# Patient Record
Sex: Female | Born: 1955
Health system: Southern US, Community
[De-identification: ages and names within clinical notes are randomized; demographics above are authoritative.]

## PROBLEM LIST (undated history)

## (undated) DIAGNOSIS — Z87440 Personal history of urinary (tract) infections: Secondary | ICD-10-CM

## (undated) DIAGNOSIS — K219 Gastro-esophageal reflux disease without esophagitis: Secondary | ICD-10-CM

## (undated) DIAGNOSIS — J984 Other disorders of lung: Secondary | ICD-10-CM

## (undated) DIAGNOSIS — F419 Anxiety disorder, unspecified: Secondary | ICD-10-CM

## (undated) DIAGNOSIS — F329 Major depressive disorder, single episode, unspecified: Secondary | ICD-10-CM

## (undated) DIAGNOSIS — F32A Depression, unspecified: Secondary | ICD-10-CM

## (undated) DIAGNOSIS — S82892A Other fracture of left lower leg, initial encounter for closed fracture: Secondary | ICD-10-CM

## (undated) DIAGNOSIS — N200 Calculus of kidney: Secondary | ICD-10-CM

## (undated) DIAGNOSIS — I10 Essential (primary) hypertension: Secondary | ICD-10-CM

## (undated) DIAGNOSIS — F319 Bipolar disorder, unspecified: Secondary | ICD-10-CM

## (undated) DIAGNOSIS — F4001 Agoraphobia with panic disorder: Secondary | ICD-10-CM

## (undated) DIAGNOSIS — K7460 Unspecified cirrhosis of liver: Secondary | ICD-10-CM

## (undated) DIAGNOSIS — R4182 Altered mental status, unspecified: Secondary | ICD-10-CM

## (undated) DIAGNOSIS — N183 Chronic kidney disease, stage 3 unspecified: Secondary | ICD-10-CM

## (undated) DIAGNOSIS — I82409 Acute embolism and thrombosis of unspecified deep veins of unspecified lower extremity: Secondary | ICD-10-CM

## (undated) DIAGNOSIS — I2699 Other pulmonary embolism without acute cor pulmonale: Secondary | ICD-10-CM

## (undated) DIAGNOSIS — R350 Frequency of micturition: Secondary | ICD-10-CM

## (undated) DIAGNOSIS — J189 Pneumonia, unspecified organism: Secondary | ICD-10-CM

## (undated) DIAGNOSIS — M1990 Unspecified osteoarthritis, unspecified site: Secondary | ICD-10-CM

## (undated) DIAGNOSIS — R011 Cardiac murmur, unspecified: Secondary | ICD-10-CM

## (undated) DIAGNOSIS — B1910 Unspecified viral hepatitis B without hepatic coma: Secondary | ICD-10-CM

## (undated) DIAGNOSIS — R32 Unspecified urinary incontinence: Secondary | ICD-10-CM

## (undated) DIAGNOSIS — J449 Chronic obstructive pulmonary disease, unspecified: Secondary | ICD-10-CM

## (undated) DIAGNOSIS — IMO0001 Reserved for inherently not codable concepts without codable children: Secondary | ICD-10-CM

## (undated) DIAGNOSIS — Z9289 Personal history of other medical treatment: Secondary | ICD-10-CM

## (undated) HISTORY — PX: HAND SURGERY: SHX662

## (undated) HISTORY — PX: CARPAL TUNNEL RELEASE: SHX101

## (undated) HISTORY — PX: TUBAL LIGATION: SHX77

## (undated) HISTORY — PX: ANKLE FRACTURE SURGERY: SHX122

## (undated) HISTORY — PX: DILATION AND CURETTAGE OF UTERUS: SHX78

## (undated) HISTORY — PX: FRACTURE SURGERY: SHX138

## (undated) HISTORY — PX: ABDOMINAL HYSTERECTOMY: SHX81

---

## 2004-09-29 ENCOUNTER — Ambulatory Visit: Payer: Self-pay | Admitting: Internal Medicine

## 2004-10-28 ENCOUNTER — Ambulatory Visit: Payer: Self-pay | Admitting: Gastroenterology

## 2004-12-01 ENCOUNTER — Ambulatory Visit: Payer: Self-pay | Admitting: Gastroenterology

## 2005-06-22 ENCOUNTER — Ambulatory Visit: Payer: Self-pay | Admitting: Gastroenterology

## 2005-09-23 ENCOUNTER — Emergency Department (HOSPITAL_COMMUNITY): Admission: EM | Admit: 2005-09-23 | Discharge: 2005-09-23 | Payer: Self-pay | Admitting: Emergency Medicine

## 2007-02-11 ENCOUNTER — Ambulatory Visit (HOSPITAL_COMMUNITY): Payer: Self-pay | Admitting: Psychiatry

## 2007-02-27 ENCOUNTER — Ambulatory Visit (HOSPITAL_COMMUNITY): Payer: Self-pay | Admitting: Psychiatry

## 2007-04-10 ENCOUNTER — Ambulatory Visit (HOSPITAL_COMMUNITY): Payer: Self-pay | Admitting: Psychiatry

## 2007-05-27 ENCOUNTER — Ambulatory Visit (HOSPITAL_COMMUNITY): Payer: Self-pay | Admitting: Psychiatry

## 2007-09-16 ENCOUNTER — Ambulatory Visit (HOSPITAL_COMMUNITY): Payer: Self-pay | Admitting: Psychiatry

## 2007-11-11 ENCOUNTER — Ambulatory Visit (HOSPITAL_COMMUNITY): Payer: Self-pay | Admitting: Psychiatry

## 2008-02-17 ENCOUNTER — Ambulatory Visit (HOSPITAL_COMMUNITY): Payer: Self-pay | Admitting: Psychiatry

## 2008-06-16 ENCOUNTER — Encounter: Payer: Self-pay | Admitting: Internal Medicine

## 2008-08-31 DIAGNOSIS — D509 Iron deficiency anemia, unspecified: Secondary | ICD-10-CM

## 2008-08-31 DIAGNOSIS — D500 Iron deficiency anemia secondary to blood loss (chronic): Secondary | ICD-10-CM | POA: Insufficient documentation

## 2008-08-31 DIAGNOSIS — I10 Essential (primary) hypertension: Secondary | ICD-10-CM

## 2008-08-31 DIAGNOSIS — F411 Generalized anxiety disorder: Secondary | ICD-10-CM | POA: Insufficient documentation

## 2008-08-31 DIAGNOSIS — Z8619 Personal history of other infectious and parasitic diseases: Secondary | ICD-10-CM | POA: Insufficient documentation

## 2008-08-31 DIAGNOSIS — B1910 Unspecified viral hepatitis B without hepatic coma: Secondary | ICD-10-CM

## 2008-08-31 DIAGNOSIS — B1710 Acute hepatitis C without hepatic coma: Secondary | ICD-10-CM

## 2008-08-31 DIAGNOSIS — B181 Chronic viral hepatitis B without delta-agent: Secondary | ICD-10-CM | POA: Insufficient documentation

## 2008-08-31 DIAGNOSIS — K7460 Unspecified cirrhosis of liver: Secondary | ICD-10-CM | POA: Insufficient documentation

## 2008-08-31 DIAGNOSIS — I2510 Atherosclerotic heart disease of native coronary artery without angina pectoris: Secondary | ICD-10-CM | POA: Insufficient documentation

## 2008-09-01 ENCOUNTER — Ambulatory Visit: Payer: Self-pay | Admitting: Internal Medicine

## 2008-09-01 DIAGNOSIS — J449 Chronic obstructive pulmonary disease, unspecified: Secondary | ICD-10-CM

## 2008-09-01 DIAGNOSIS — E669 Obesity, unspecified: Secondary | ICD-10-CM

## 2009-02-04 ENCOUNTER — Emergency Department (HOSPITAL_COMMUNITY): Admission: EM | Admit: 2009-02-04 | Discharge: 2009-02-04 | Payer: Self-pay | Admitting: Emergency Medicine

## 2009-02-23 ENCOUNTER — Ambulatory Visit (HOSPITAL_BASED_OUTPATIENT_CLINIC_OR_DEPARTMENT_OTHER): Admission: RE | Admit: 2009-02-23 | Discharge: 2009-02-23 | Payer: Self-pay | Admitting: Urology

## 2009-09-23 DIAGNOSIS — F32A Depression, unspecified: Secondary | ICD-10-CM | POA: Insufficient documentation

## 2009-09-23 DIAGNOSIS — F329 Major depressive disorder, single episode, unspecified: Secondary | ICD-10-CM | POA: Insufficient documentation

## 2009-09-23 DIAGNOSIS — R188 Other ascites: Secondary | ICD-10-CM | POA: Insufficient documentation

## 2009-12-06 ENCOUNTER — Emergency Department (HOSPITAL_COMMUNITY): Admission: EM | Admit: 2009-12-06 | Discharge: 2009-12-06 | Payer: Self-pay | Admitting: Emergency Medicine

## 2009-12-08 ENCOUNTER — Emergency Department (HOSPITAL_COMMUNITY): Admission: EM | Admit: 2009-12-08 | Discharge: 2009-12-08 | Payer: Self-pay | Admitting: Emergency Medicine

## 2009-12-10 ENCOUNTER — Emergency Department (HOSPITAL_COMMUNITY): Admission: EM | Admit: 2009-12-10 | Discharge: 2009-12-10 | Payer: Self-pay | Admitting: Emergency Medicine

## 2009-12-14 ENCOUNTER — Emergency Department (HOSPITAL_COMMUNITY): Admission: EM | Admit: 2009-12-14 | Discharge: 2009-12-14 | Payer: Self-pay | Admitting: Family Medicine

## 2010-03-11 ENCOUNTER — Other Ambulatory Visit (HOSPITAL_COMMUNITY): Payer: Self-pay | Admitting: Oral Surgery

## 2010-03-11 ENCOUNTER — Encounter (HOSPITAL_COMMUNITY)
Admission: RE | Admit: 2010-03-11 | Discharge: 2010-03-11 | Disposition: A | Discharge status: Home / Self Care | Payer: Medicare Other | Source: Ambulatory Visit | Attending: Oral Surgery | Admitting: Oral Surgery

## 2010-03-11 ENCOUNTER — Ambulatory Visit (HOSPITAL_COMMUNITY)
Admission: RE | Admit: 2010-03-11 | Discharge: 2010-03-11 | Disposition: A | Discharge status: Home / Self Care | Payer: Medicare Other | Source: Ambulatory Visit | Attending: Oral Surgery | Admitting: Oral Surgery

## 2010-03-11 DIAGNOSIS — K0889 Other specified disorders of teeth and supporting structures: Secondary | ICD-10-CM

## 2010-03-11 DIAGNOSIS — Z01818 Encounter for other preprocedural examination: Secondary | ICD-10-CM | POA: Insufficient documentation

## 2010-03-11 LAB — COMPREHENSIVE METABOLIC PANEL
Alkaline Phosphatase: 77 U/L (ref 39–117)
BUN: 32 mg/dL — ABNORMAL HIGH (ref 6–23)
Calcium: 9.1 mg/dL (ref 8.4–10.5)
Creatinine, Ser: 1.3 mg/dL — ABNORMAL HIGH (ref 0.4–1.2)
GFR calc Af Amer: 52 mL/min — ABNORMAL LOW (ref >60)
Glucose, Bld: 85 mg/dL (ref 70–99)
Sodium: 140 mEq/L (ref 135–145)
Total Protein: 6.6 g/dL (ref 6.0–8.3)

## 2010-03-11 LAB — CBC
HCT: 38.8 % (ref 36.0–46.0)
MCHC: 33.2 g/dL (ref 30.0–36.0)
RBC: 4.14 MIL/uL (ref 3.87–5.11)
WBC: 6.2 10*3/uL (ref 4.0–10.5)

## 2010-03-11 LAB — BILIRUBIN, DIRECT: Bilirubin, Direct: <0.1 mg/dL (ref 0.0–0.3)

## 2010-03-15 ENCOUNTER — Ambulatory Visit (HOSPITAL_COMMUNITY): Admission: RE | Admit: 2010-03-15 | Payer: Medicare Other | Source: Ambulatory Visit | Admitting: Oral Surgery

## 2010-04-03 ENCOUNTER — Emergency Department (HOSPITAL_COMMUNITY)
Admission: EM | Admit: 2010-04-03 | Discharge: 2010-04-03 | Disposition: A | Discharge status: Home / Self Care | Payer: Medicare Other | Attending: Emergency Medicine | Admitting: Emergency Medicine

## 2010-04-03 ENCOUNTER — Emergency Department (HOSPITAL_COMMUNITY): Payer: Medicare Other

## 2010-04-03 DIAGNOSIS — J45909 Unspecified asthma, uncomplicated: Secondary | ICD-10-CM | POA: Insufficient documentation

## 2010-04-03 DIAGNOSIS — R1013 Epigastric pain: Secondary | ICD-10-CM | POA: Insufficient documentation

## 2010-04-03 DIAGNOSIS — Z79899 Other long term (current) drug therapy: Secondary | ICD-10-CM | POA: Insufficient documentation

## 2010-04-03 DIAGNOSIS — Z8619 Personal history of other infectious and parasitic diseases: Secondary | ICD-10-CM | POA: Insufficient documentation

## 2010-04-03 DIAGNOSIS — K7460 Unspecified cirrhosis of liver: Secondary | ICD-10-CM | POA: Insufficient documentation

## 2010-04-03 DIAGNOSIS — I10 Essential (primary) hypertension: Secondary | ICD-10-CM | POA: Insufficient documentation

## 2010-04-03 DIAGNOSIS — K219 Gastro-esophageal reflux disease without esophagitis: Secondary | ICD-10-CM | POA: Insufficient documentation

## 2010-04-03 DIAGNOSIS — F411 Generalized anxiety disorder: Secondary | ICD-10-CM | POA: Insufficient documentation

## 2010-04-03 DIAGNOSIS — R05 Cough: Secondary | ICD-10-CM | POA: Insufficient documentation

## 2010-04-03 DIAGNOSIS — R059 Cough, unspecified: Secondary | ICD-10-CM | POA: Insufficient documentation

## 2010-04-03 DIAGNOSIS — R10816 Epigastric abdominal tenderness: Secondary | ICD-10-CM | POA: Insufficient documentation

## 2010-04-03 LAB — COMPREHENSIVE METABOLIC PANEL
CO2: 27 mEq/L (ref 19–32)
Calcium: 9.1 mg/dL (ref 8.4–10.5)
Creatinine, Ser: 1.21 mg/dL — ABNORMAL HIGH (ref 0.4–1.2)
GFR calc Af Amer: 56 mL/min — ABNORMAL LOW (ref >60)
GFR calc non Af Amer: 47 mL/min — ABNORMAL LOW (ref >60)
Glucose, Bld: 120 mg/dL — ABNORMAL HIGH (ref 70–99)
Potassium: 4.4 mEq/L (ref 3.5–5.1)
Total Bilirubin: 0.3 mg/dL (ref 0.3–1.2)

## 2010-04-03 LAB — DIFFERENTIAL
Basophils Absolute: 0 10*3/uL (ref 0.0–0.1)
Basophils Relative: 0 % (ref 0–1)
Eosinophils Absolute: 0 10*3/uL (ref 0.0–0.7)
Lymphocytes Relative: 11 % — ABNORMAL LOW (ref 12–46)
Monocytes Absolute: 0.4 10*3/uL (ref 0.1–1.0)
Neutro Abs: 7.3 10*3/uL (ref 1.7–7.7)

## 2010-04-03 LAB — CBC
MCV: 94.7 fL (ref 78.0–100.0)
Platelets: 136 10*3/uL — ABNORMAL LOW (ref 150–400)
RBC: 3.93 MIL/uL (ref 3.87–5.11)
RDW: 13.6 % (ref 11.5–15.5)
WBC: 8.7 10*3/uL (ref 4.0–10.5)

## 2010-04-13 LAB — TYPE AND SCREEN
ABO/RH(D): B NEG
Antibody Screen: NEGATIVE

## 2010-04-13 LAB — COMPREHENSIVE METABOLIC PANEL
AST: 19 U/L (ref 0–37)
Albumin: 3.8 g/dL (ref 3.5–5.2)
Alkaline Phosphatase: 91 U/L (ref 39–117)
CO2: 24 mEq/L (ref 19–32)
Chloride: 105 mEq/L (ref 96–112)
Glucose, Bld: 97 mg/dL (ref 70–99)
Potassium: 4 mEq/L (ref 3.5–5.1)
Sodium: 137 mEq/L (ref 135–145)
Total Bilirubin: 0.7 mg/dL (ref 0.3–1.2)
Total Protein: 7.4 g/dL (ref 6.0–8.3)

## 2010-04-13 LAB — CBC
HCT: 46.9 % — ABNORMAL HIGH (ref 36.0–46.0)
Hemoglobin: 15.8 g/dL — ABNORMAL HIGH (ref 12.0–15.0)
MCHC: 33.6 g/dL (ref 30.0–36.0)
RBC: 4.9 MIL/uL (ref 3.87–5.11)
RDW: 13.1 % (ref 11.5–15.5)

## 2010-04-13 LAB — PROTIME-INR: INR: 1.04 (ref 0.00–1.49)

## 2010-04-13 LAB — ABO/RH: ABO/RH(D): B NEG

## 2010-05-16 ENCOUNTER — Ambulatory Visit (INDEPENDENT_AMBULATORY_CARE_PROVIDER_SITE_OTHER): Payer: Medicare Other | Admitting: Gastroenterology

## 2010-05-16 DIAGNOSIS — K7460 Unspecified cirrhosis of liver: Secondary | ICD-10-CM

## 2010-05-16 DIAGNOSIS — B181 Chronic viral hepatitis B without delta-agent: Secondary | ICD-10-CM

## 2010-05-16 DIAGNOSIS — R945 Abnormal results of liver function studies: Secondary | ICD-10-CM

## 2010-05-30 ENCOUNTER — Inpatient Hospital Stay (HOSPITAL_COMMUNITY)
Admission: EM | Admit: 2010-05-30 | Discharge: 2010-06-03 | DRG: 193 | Disposition: A | Discharge status: Home / Self Care | Payer: Medicare Other | Attending: Internal Medicine | Admitting: Internal Medicine

## 2010-05-30 ENCOUNTER — Emergency Department (HOSPITAL_COMMUNITY): Payer: Medicare Other

## 2010-05-30 DIAGNOSIS — J189 Pneumonia, unspecified organism: Principal | ICD-10-CM | POA: Diagnosis present

## 2010-05-30 DIAGNOSIS — D6959 Other secondary thrombocytopenia: Secondary | ICD-10-CM | POA: Diagnosis present

## 2010-05-30 DIAGNOSIS — K219 Gastro-esophageal reflux disease without esophagitis: Secondary | ICD-10-CM | POA: Diagnosis present

## 2010-05-30 DIAGNOSIS — J45909 Unspecified asthma, uncomplicated: Secondary | ICD-10-CM | POA: Diagnosis present

## 2010-05-30 DIAGNOSIS — I129 Hypertensive chronic kidney disease with stage 1 through stage 4 chronic kidney disease, or unspecified chronic kidney disease: Secondary | ICD-10-CM | POA: Diagnosis present

## 2010-05-30 DIAGNOSIS — F3289 Other specified depressive episodes: Secondary | ICD-10-CM | POA: Diagnosis present

## 2010-05-30 DIAGNOSIS — K5900 Constipation, unspecified: Secondary | ICD-10-CM | POA: Diagnosis present

## 2010-05-30 DIAGNOSIS — E876 Hypokalemia: Secondary | ICD-10-CM | POA: Diagnosis not present

## 2010-05-30 DIAGNOSIS — F17200 Nicotine dependence, unspecified, uncomplicated: Secondary | ICD-10-CM | POA: Diagnosis present

## 2010-05-30 DIAGNOSIS — K7460 Unspecified cirrhosis of liver: Secondary | ICD-10-CM | POA: Diagnosis present

## 2010-05-30 DIAGNOSIS — E871 Hypo-osmolality and hyponatremia: Secondary | ICD-10-CM | POA: Diagnosis present

## 2010-05-30 DIAGNOSIS — F329 Major depressive disorder, single episode, unspecified: Secondary | ICD-10-CM | POA: Diagnosis present

## 2010-05-30 DIAGNOSIS — J9600 Acute respiratory failure, unspecified whether with hypoxia or hypercapnia: Secondary | ICD-10-CM | POA: Diagnosis present

## 2010-05-30 DIAGNOSIS — N189 Chronic kidney disease, unspecified: Secondary | ICD-10-CM | POA: Diagnosis present

## 2010-05-30 DIAGNOSIS — Z7982 Long term (current) use of aspirin: Secondary | ICD-10-CM

## 2010-05-30 DIAGNOSIS — N179 Acute kidney failure, unspecified: Secondary | ICD-10-CM | POA: Diagnosis present

## 2010-05-30 DIAGNOSIS — B1910 Unspecified viral hepatitis B without hepatic coma: Secondary | ICD-10-CM | POA: Diagnosis present

## 2010-05-30 LAB — POCT CARDIAC MARKERS
CKMB, poc: <1 ng/mL — ABNORMAL LOW (ref 1.0–8.0)
Myoglobin, poc: 85.5 ng/mL (ref 12–200)
Troponin i, poc: <0.05 ng/mL (ref 0.00–0.09)

## 2010-05-30 LAB — CBC
HCT: 37.4 % (ref 36.0–46.0)
Hemoglobin: 12.6 g/dL (ref 12.0–15.0)
MCHC: 33.7 g/dL (ref 30.0–36.0)
MCV: 91.7 fL (ref 78.0–100.0)
RDW: 13.2 % (ref 11.5–15.5)

## 2010-05-30 LAB — BASIC METABOLIC PANEL
BUN: 18 mg/dL (ref 6–23)
Calcium: 9.2 mg/dL (ref 8.4–10.5)
Creatinine, Ser: 1.49 mg/dL — ABNORMAL HIGH (ref 0.4–1.2)
GFR calc non Af Amer: 37 mL/min — ABNORMAL LOW (ref >60)
Glucose, Bld: 112 mg/dL — ABNORMAL HIGH (ref 70–99)

## 2010-05-30 LAB — DIFFERENTIAL
Basophils Absolute: 0 10*3/uL (ref 0.0–0.1)
Basophils Relative: 0 % (ref 0–1)
Eosinophils Relative: 0 % (ref 0–5)
Monocytes Absolute: 0.7 10*3/uL (ref 0.1–1.0)
Monocytes Relative: 9 % (ref 3–12)

## 2010-05-30 LAB — LIPID PANEL
HDL: 68 mg/dL (ref >39)
Total CHOL/HDL Ratio: 2.1 RATIO
Triglycerides: 71 mg/dL (ref <150)

## 2010-05-30 LAB — CK TOTAL AND CKMB (NOT AT ARMC): Relative Index: INVALID (ref 0.0–2.5)

## 2010-05-31 ENCOUNTER — Inpatient Hospital Stay (HOSPITAL_COMMUNITY): Payer: Medicare Other

## 2010-05-31 LAB — COMPREHENSIVE METABOLIC PANEL
ALT: 16 U/L (ref 0–35)
Alkaline Phosphatase: 76 U/L (ref 39–117)
BUN: 20 mg/dL (ref 6–23)
CO2: 26 mEq/L (ref 19–32)
GFR calc non Af Amer: 37 mL/min — ABNORMAL LOW (ref >60)
Glucose, Bld: 173 mg/dL — ABNORMAL HIGH (ref 70–99)
Potassium: 3.3 mEq/L — ABNORMAL LOW (ref 3.5–5.1)
Sodium: 137 mEq/L (ref 135–145)
Total Protein: 6.6 g/dL (ref 6.0–8.3)

## 2010-05-31 LAB — CK TOTAL AND CKMB (NOT AT ARMC)
CK, MB: 2.3 ng/mL (ref 0.3–4.0)
Relative Index: INVALID (ref 0.0–2.5)
Total CK: 71 U/L (ref 7–177)
Total CK: 84 U/L (ref 7–177)

## 2010-05-31 LAB — CBC
MCHC: 33.3 g/dL (ref 30.0–36.0)
Platelets: 95 10*3/uL — ABNORMAL LOW (ref 150–400)
RDW: 13.5 % (ref 11.5–15.5)
WBC: 5.9 10*3/uL (ref 4.0–10.5)

## 2010-05-31 LAB — TSH: TSH: 0.596 u[IU]/mL (ref 0.350–4.500)

## 2010-06-01 ENCOUNTER — Inpatient Hospital Stay (HOSPITAL_COMMUNITY): Payer: Medicare Other

## 2010-06-01 LAB — CBC
HCT: 34.3 % — ABNORMAL LOW (ref 36.0–46.0)
MCH: 30.8 pg (ref 26.0–34.0)
MCHC: 32.9 g/dL (ref 30.0–36.0)
MCV: 93.5 fL (ref 78.0–100.0)
Platelets: 98 10*3/uL — ABNORMAL LOW (ref 150–400)
RDW: 13.4 % (ref 11.5–15.5)
WBC: 4.9 10*3/uL (ref 4.0–10.5)

## 2010-06-01 LAB — BASIC METABOLIC PANEL
BUN: 18 mg/dL (ref 6–23)
Calcium: 9.2 mg/dL (ref 8.4–10.5)
Creatinine, Ser: 1.45 mg/dL — ABNORMAL HIGH (ref 0.4–1.2)
GFR calc non Af Amer: 38 mL/min — ABNORMAL LOW (ref >60)
Glucose, Bld: 158 mg/dL — ABNORMAL HIGH (ref 70–99)

## 2010-06-01 LAB — MAGNESIUM: Magnesium: 2 mg/dL (ref 1.5–2.5)

## 2010-06-02 LAB — DIFFERENTIAL
Basophils Relative: 0 % (ref 0–1)
Eosinophils Absolute: 0.1 10*3/uL (ref 0.0–0.7)
Eosinophils Relative: 2 % (ref 0–5)
Lymphs Abs: 1 10*3/uL (ref 0.7–4.0)
Neutrophils Relative %: 70 % (ref 43–77)

## 2010-06-02 LAB — CBC
MCV: 93.5 fL (ref 78.0–100.0)
Platelets: 129 10*3/uL — ABNORMAL LOW (ref 150–400)
RDW: 13.4 % (ref 11.5–15.5)
WBC: 5.1 10*3/uL (ref 4.0–10.5)

## 2010-06-02 LAB — BASIC METABOLIC PANEL
BUN: 15 mg/dL (ref 6–23)
Creatinine, Ser: 1.14 mg/dL (ref 0.4–1.2)
GFR calc Af Amer: >60 mL/min (ref >60)
GFR calc non Af Amer: 50 mL/min — ABNORMAL LOW (ref >60)
Potassium: 4.2 mEq/L (ref 3.5–5.1)

## 2010-06-03 LAB — CBC
HCT: 34.5 % — ABNORMAL LOW (ref 36.0–46.0)
Platelets: 131 10*3/uL — ABNORMAL LOW (ref 150–400)
RBC: 3.69 MIL/uL — ABNORMAL LOW (ref 3.87–5.11)
RDW: 13.5 % (ref 11.5–15.5)
WBC: 4.3 10*3/uL (ref 4.0–10.5)

## 2010-06-03 LAB — BASIC METABOLIC PANEL
Chloride: 101 mEq/L (ref 96–112)
GFR calc non Af Amer: 51 mL/min — ABNORMAL LOW (ref >60)
Potassium: 4.1 mEq/L (ref 3.5–5.1)
Sodium: 139 mEq/L (ref 135–145)

## 2010-06-06 LAB — CULTURE, BLOOD (ROUTINE X 2)
Culture  Setup Time: 201205080341
Culture  Setup Time: 201205080341
Culture: NO GROWTH

## 2010-06-08 NOTE — Discharge Summary (Signed)
[name], CASTNER NO.:  [id number]  MEDICAL RECORD NO.:  [id number]           PATIENT TYPE:  I  LOCATION:  4505                         FACILITY:  [facility]  PHYSICIAN:  Hartley Barefoot, MD    DATE OF BIRTH:  1955-05-20  DATE OF ADMISSION:  05/30/2010 DATE OF DISCHARGE:  06/03/2010                              DISCHARGE SUMMARY   DISCHARGE DIAGNOSES: 1. Acute respiratory failure secondary to pneumonia. 2. Community-acquired pneumonia. 3. Hypertension. 4. Constipation. 5. Tobacco dependence. 6. History of asthma. 7. Gastroesophageal reflux disease. 8. Acute renal insufficiency. 9. Hyponatremia secondary to dehydration. 10.Chronic thrombocytopenia secondary to cirrhosis. 11.Cirrhosis. 12.History of hepatitis B. 13.History of depression.  DISCHARGE MEDICATIONS: 1. Albuterol 90 mcg every 6 hours as needed. 2. Bisacodyl 10 mg suppository directly one as needed for     constipation. 3. Docusate 100 mg p.o. b.i.d. 4. Ipratropium 25 mcg inhaled every 6 hours. 5. Avelox 400 mg p.o. b.i.d. for 3 more days. 6. Nicotine patch 21 mcg every 24 hours daily. 7. Percocet 5/325 one tablet by mouth every 8 hours as needed. 8. Alprazolam 1 mg one tablet by mouth 3 times a day. 9. Aspirin 81 mg p.o. daily. 10.Celebrex 200 mg p.o. daily. 11.Doxepin 75 mg 1-2 tablets by mouth daily at bedtime. 12.Entecavir 0.5 mg 1 tablet every Thursday. 13.Iloperidone 60 mg one tablet by mouth daily. 14.Lexapro 20 mg one tablet by mouth daily. 15.Loratadine 10 mg every other day as needed. 16.Metoprolol 25 mg one tablet by mouth daily. 17.Nasacort 1-2 sprays nasally every morning as needed. 18.Pantoprazole 40 mg p.o. daily twice a day. 19.Spironolactone 25 mg 2 tablets by mouth twice daily. 20.Symbicort 2 puff inhaled p.o. b.i.d. 160/4.5 mcg. 21.Tenofovir 300 mg one tablet by mouth every other day.  Medications that were stopped at this time verapamil.  BRIEF HISTORY OF PRESENT  ILLNESS:  This is a very pleasant 55 year old woman who presented to the emergency department complaining of some chest discomfort and shortness of breath.  Has been going on for a week now.  She was having some shortness of breath.  She had a very productive cough with white sputum.  She had a fever at home.  She has mild chest pain mainly when she coughs.  RADIOGRAPHIC STUDIES: 1. Chest x-ray on May 30, 2010, showed patchy bibasilar atelectasis,     infiltrations and  pleural effusion. 2. On May 31, 2010, slight increase of the left basilar opacities     suspicious for pneumonia and probable small effusion. 3. Abdominal x-ray, large amount of stool in the colon compatible with     constipation.  No obstruction.  HOSPITAL COURSE: 1. Acute respiratory failure likely secondary to pneumonia.  The     patient was started on oxygen, nebulizer treatment and antibiotics.     During hospitalization, her acute respiratory failure has resolved. 2. Community-acquired pneumonia.  The patient was started on     ceftriaxone and Cipro.  She received a total of 3 days of IV     antibiotics.  This was then transitioned to p.o. Avelox.  She will  need 3 more days of Avelox to complete a total course of 7 days of     antibiotics.  She is having some chest pain still on the right and     on the left side when she coughs.  We will provide some Percocet     for pain as needed. 3. Hypertension.  Initially, verapamil was on hold because blood pressure     was soft.  Also, initially her spironolactone was on hold, but     Pulmonary  restarted this medication. 4. Tobacco dependence.  Consult was provided nicotine patch. 5. Constipation.  The patient had abdominal x-ray did show     constipation.  She was on bowel regimen.  She refused Dulcolax     enema.  She says that she wants to have the enema at home.  A     prescription will be provided.  No rigidity.  No guarding on     physical exam. 6. Acute renal  insufficiency.  On admission, creatinine was 1.49.     After IV fluids, her creatinine decreased to 1.1.  We are going to     restart her spironolactone.  She will need a BMET to follow renal     function.   On the day of discharge, the patient was in improved condition. Shortness of breath improved.  Sats 93 on room air, blood pressure 137/97, pulse 70, respirations 16, and temp 97.9.  DISCHARGE LABS:  Sodium 139, potassium 4.1, chloride 101, bicarb 30, glucose 112, BUN 16, and creatinine 1.1.  White blood cell 4.3, hemoglobin 11.5, and platelets 131.  Acute renal insufficiency.  On admission, creatinine was 1.49.  After IV fluids, her creatinine decreased to 1.1.  We are going to reassess her spironolactone.  She will need a BMET to follow renal function.  The patient was discharged in improved condition.     Hartley Barefoot, MD    BR/MEDQ  D:  06/03/2010  T:  06/04/2010  Job:  [id number]  Electronically Signed by Hartley Barefoot MD on 06/08/2010 12:14:59 PM

## 2010-06-23 NOTE — H&P (Signed)
[name], Fields NO.:  [id number]  MEDICAL RECORD NO.:  [id number]           PATIENT TYPE:  E  LOCATION:  [facility]                         FACILITY:  [facility]  PHYSICIAN:  [name], M.D.      DATE OF BIRTH:  28-Feb-1955  DATE OF ADMISSION:  05/30/2010 DATE OF DISCHARGE:                             HISTORY & PHYSICAL   PRIMARY CARE PHYSICIAN:  Dr. Willa Rough at [facility], so she is unassigned to [place].  PRESENTING COMPLAINT:  Shortness of breath and cough.  HISTORY OF PRESENT ILLNESS:  The patient is a 55 year old female that came in complaining of some chest discomfort and shortness of breath. He has been going on now for about a week, it has been gradual.  The chest discomfort; however, came of more since yesterday.  She has hypertension and worsening shortness of breath.  She smokes about a pack per day but is worried that this is contributing.  She also had cough that is productive of white sputum.  She had one episode of fever at home but denied otherwise, chills.  No hemoptysis.  No hematemesis.  No melena.  No bright red blood per rectum.  She has history of liver cirrhosis, which has been stable and her cirrhosis was thought to be secondary to hepatitis B.  She had mild chest pain rated as 5/10 that is retrosternal mainly with cough.  Now it has become bilateral at the lower part of her rib cages.  No radiation but worsened with any cough or major movement.  PAST MEDICAL HISTORY:  Significant for asthma, hypertension, liver cirrhosis, GERD, hepatitis B and tobacco dependence.  ALLERGIES:  Cymbalta, Neosporin, and Neurontin.  CURRENT MEDICATIONS:  Verapamil 360 mg daily, Celebrex 200 mg daily, Lexapro 10 mg daily, Protonix 40 mg daily, Viread oral tablet daily, and Advair Diskus 2 puffs b.i.d.  SOCIAL HISTORY:  The patient lives in [place] with her family.  She denied any alcohol or IV drug use.  Her last alcohol was 20 years ago due to her hepatitis  B.  She still smokes about one pack per day, which she wants to quit.  FAMILY HISTORY:  Denied any significant family history except for hypertension in her extended family.  REVIEW OF SYSTEMS:  All systems reviewed are negative except as per HPI.  PHYSICAL EXAMINATION:  VITAL SIGNS: Temperature 101.4 orally.  Her blood pressure is 97/61, pulse 79, respiratory rate is 24.  Her initial sats was 91% on room air and was mobility dropped to 84% on room air.  She is currently 92% on 2 L.  GENERAL:  She is awake, alert, oriented, obese woman in no acute distress. HEENT:  PERRL.  EOMI.  No pallor, no jaundice.  No rhinorrhea. NECK:  Supple.  No JVD, no lymphadenopathy.  No pharyngeal erythema.  No rhinorrhea. NECK:  Supple.  No JVD.  No lymphadenopathy. RESPIRATORY:  She has decreased air entry at the bases with some mild coarse crackles.  No wheezes nor rales. CARDIOVASCULAR SYSTEM:  She has S1, S2.  No audible murmur. ABDOMEN:  Obese, soft, nontender with positive  bowel sounds. EXTREMITIES: No significant edema, cyanosis or clubbing.  She has signs of varicose veins. SKIN:  Mainly varicosities, but no ulcers, no rashes, no petechia. MUSCULOSKELETAL:  No significant joint swelling, tenderness or decreasing range of motion.  LABORATORY DATA:  Her EKG showed normal sinus rhythm with a rate of 86, normal intervals.  No significant ST-T wave changes.  Sodium is 131, potassium 3.5, chloride 96, CO2 of 24, glucose 112, BUN 18, creatinine 1.49.  White count 8.6 with left shift, hemoglobin 12.6 and platelet of 100.  Initial cardiac enzymes are negative.  Chest x-ray showed patchy bilateral infiltrate or atelectasis and small bilateral pleural effusions.  ASSESSMENT:  This is a 55 year old female coming from home with bilateral pneumonia.  The patient is a chronic smoker and has baseline history of asthma.  More than likely, this reflects community-acquired pneumonia.  She also has elements of  hypoxia.  PLAN: 1. Acute respiratory failure with her sats in the 80s.  She has become     hypoxic secondary to pneumonia.  We will treat the underlying     pneumonia.  Put her on oxygen and treat her until and trying to     titrate her off oxygen before discharge. 2. Community-acquired pneumonia.  Based on the protocol, I will start     the patient on ceftriaxone and Zithromax IV once she is more stable     and her blood cultures back as well as sputum culture would     determine if we need to make some changes.  If not we will     transition her to oral antibiotics to complete at least 7-10 days     of treatment if she has no gram-negative bacteremia. 3. Hypertension.  Continue with her home medication as much as     possible. 4. Asthma.  I will put on empiric nebulizers, although she is     currently not wheezing. 5. Tobacco dependence.  The patient wants to quit.  I will put her on     some nicotine patch and will continue with tobacco cessation     counseling. 6. Gastroesophageal reflux disease.  Continuous PPI's.  History of     liver cirrhosis, probably this resulted in her hyponatremia and     thrombocytopenia.  We will watch closely and be careful with fluid     resuscitation. 7. Acute renal failure.  Per the patient, her renal function is     normally okay but her creatinine is elevated now probably with     hydration it will get any better.  We will follow it closely.     [name], M.D.     Verlin Grills  D:  05/30/2010  T:  05/30/2010  Job:  [id number]  Electronically Signed by [name] M.D. on 06/23/2010 11:45:01 AM

## 2010-10-10 ENCOUNTER — Encounter (HOSPITAL_COMMUNITY)
Admission: RE | Admit: 2010-10-10 | Discharge: 2010-10-10 | Disposition: A | Discharge status: Home / Self Care | Payer: Medicare Other | Source: Ambulatory Visit | Attending: Oral Surgery | Admitting: Oral Surgery

## 2010-10-10 ENCOUNTER — Other Ambulatory Visit (HOSPITAL_COMMUNITY): Payer: Self-pay | Admitting: Oral Surgery

## 2010-10-10 DIAGNOSIS — K0889 Other specified disorders of teeth and supporting structures: Secondary | ICD-10-CM

## 2010-10-10 LAB — COMPREHENSIVE METABOLIC PANEL
AST: 15 U/L (ref 0–37)
Albumin: 3.7 g/dL (ref 3.5–5.2)
BUN: 16 mg/dL (ref 6–23)
Chloride: 97 mEq/L (ref 96–112)
Creatinine, Ser: 1.48 mg/dL — ABNORMAL HIGH (ref 0.50–1.10)
Total Bilirubin: 0.7 mg/dL (ref 0.3–1.2)
Total Protein: 7.1 g/dL (ref 6.0–8.3)

## 2010-10-10 LAB — CBC
MCHC: 34.4 g/dL (ref 30.0–36.0)
MCV: 91.9 fL (ref 78.0–100.0)
Platelets: DECREASED 10*3/uL (ref 150–400)
RDW: 13.5 % (ref 11.5–15.5)
WBC: 6.7 10*3/uL (ref 4.0–10.5)

## 2010-10-18 ENCOUNTER — Ambulatory Visit (HOSPITAL_COMMUNITY)
Admission: RE | Admit: 2010-10-18 | Discharge: 2010-10-18 | Disposition: A | Discharge status: Home / Self Care | Payer: Medicare Other | Source: Ambulatory Visit | Attending: Oral Surgery | Admitting: Oral Surgery

## 2010-10-18 DIAGNOSIS — I10 Essential (primary) hypertension: Secondary | ICD-10-CM | POA: Insufficient documentation

## 2010-10-18 DIAGNOSIS — B1910 Unspecified viral hepatitis B without hepatic coma: Secondary | ICD-10-CM | POA: Insufficient documentation

## 2010-10-18 DIAGNOSIS — E669 Obesity, unspecified: Secondary | ICD-10-CM | POA: Insufficient documentation

## 2010-10-18 DIAGNOSIS — K089 Disorder of teeth and supporting structures, unspecified: Secondary | ICD-10-CM | POA: Insufficient documentation

## 2010-10-18 DIAGNOSIS — J449 Chronic obstructive pulmonary disease, unspecified: Secondary | ICD-10-CM | POA: Insufficient documentation

## 2010-10-18 DIAGNOSIS — J4489 Other specified chronic obstructive pulmonary disease: Secondary | ICD-10-CM | POA: Insufficient documentation

## 2010-10-18 DIAGNOSIS — B1920 Unspecified viral hepatitis C without hepatic coma: Secondary | ICD-10-CM | POA: Insufficient documentation

## 2010-10-18 NOTE — Op Note (Signed)
NAMECAMILLA, [name] NO.:  [id number]  MEDICAL RECORD NO.:  [id number]  LOCATION:  [facility]                         FACILITY:  [facility]  PHYSICIAN:  Georgia Lopes, M.D.  DATE OF BIRTH:  1955-05-03  DATE OF PROCEDURE:  10/18/2010 DATE OF DISCHARGE:                              OPERATIVE REPORT   PREOPERATIVE DIAGNOSIS:  Nonrestorable teeth numbers 13, 18, 20, 23, 25, 26, 30, 31.  POSTOPERATIVE DIAGNOSIS:  Nonrestorable teeth numbers 13, 18, 20, 23, 25, 26, 30, 31.  PROCEDURE:  Removal of teeth numbers 13, 18, 20, 23, 25, 26, 30, and 31 and alveoplasty of right and left mandible.  SURGEON:  Georgia Lopes, MD  ANESTHESIA:  General, Dr. Jacklynn Bue attending, oral intubation.  ASSISTANTS: 1. Luberta Mutter, DOMA 2. Arlee Muslim, DA  INDICATIONS FOR PROCEDURE:  [name] is a 55 year old female who is referred to me by her general dentist for removal of multiple teeth.  She has significant comorbidities including severe COPD, hypertension, hep B, hep C, and obesity.  Because of the severe dental phobia of the patient and the need for adequate anesthesia, it was recommended that the patient have intubation for airway protection during the surgery.  PROCEDURE:  The patient was taken to the operating room and placed on table in supine position.  General anesthesia was administered intravenously and an oral endotracheal tube was placed and marked.  The eyes were protected.  The patient was draped for the procedure.  The posterior pharynx was suctioned.  A Yankauer suction was used for this and then a throat pack was placed.  2% lidocaine with 1:100,000 epinephrine was infiltrated in inferior alveolar block on the left side and buccal and palatal infiltration around tooth #13.  Then, inferior alveolar block was administered on the right side, a total of 10 mL was utilized.  A bite block was placed in the right side of the mouth and the tongue was retracted with a  sweetheart retractor.  Then, a #15 blade was used to make a full-thickness incision around tooth #13 and around teeth numbers 18, 20, and 23.  The periosteum was reflected with periosteal elevator and a proximal bone was removed with a Sumac with a fissure bur around teeth numbers 13, 18, and 20.  The 301 elevator was then used to elevate the teeth and the upper tooth was removed with the upper forceps #150 and the lower teeth were removed with the cowhorn forceps in the posterior region and the Ash forceps in the anterior region.  Tooth #23 was then removed with the Ash forceps.  Sockets were then curetted and then the tissue was found to be immobile around these teeth, so no sutures were placed.  The bite block and sweetheart retractor were repositioned.  The #15 blade was used to make an incision around teeth numbers 30, 31, and 26.  Periosteum was reflected with a periosteal elevator.  Bone was removed around teeth numbers 30 and 31. The teeth were elevated with a 301 elevator and 30 and 31 were removed with the cowhorn forceps and the tooth numbers 25 and 26 were removed with the Ash forceps.  The  sockets were curetted.  Alveoplasty was performed in the right mandible and tissue was removed in the anterior mandible and reflected to allow for alveoplasty in the anterior mandible between the canines.  Then, this area was smoothed with a rongeur and bone file and then closed with 3-0 chromic.  3-0 chromic was also placed in the right mandible posterior.  The oral cavity was inspected and found to have good hemostasis and closure.  The oral cavity was irrigated and suctioned.  Throat pack was removed.  The patient was awakened and taken to the recovery breathing spontaneously in good condition.  ESTIMATED BLOOD LOSS:  Minimum.  COMPLICATIONS:  None.  SPECIMENS:  None.     Georgia Lopes, M.D.     SMJ/MEDQ  D:  10/18/2010  T:  10/18/2010  Job:  [id number]  Electronically  Signed by Ocie Doyne M.D. on 10/18/2010 10:55:39 AM

## 2010-10-31 ENCOUNTER — Emergency Department (HOSPITAL_COMMUNITY): Payer: Medicare Other

## 2010-10-31 ENCOUNTER — Inpatient Hospital Stay (HOSPITAL_COMMUNITY)
Admission: EM | Admit: 2010-10-31 | Discharge: 2010-11-03 | DRG: 190 | Disposition: A | Discharge status: Home / Self Care | Payer: Medicare Other | Attending: Family Medicine | Admitting: Family Medicine

## 2010-10-31 DIAGNOSIS — Z7982 Long term (current) use of aspirin: Secondary | ICD-10-CM

## 2010-10-31 DIAGNOSIS — J9600 Acute respiratory failure, unspecified whether with hypoxia or hypercapnia: Secondary | ICD-10-CM | POA: Diagnosis present

## 2010-10-31 DIAGNOSIS — K7460 Unspecified cirrhosis of liver: Secondary | ICD-10-CM | POA: Diagnosis present

## 2010-10-31 DIAGNOSIS — I10 Essential (primary) hypertension: Secondary | ICD-10-CM | POA: Diagnosis present

## 2010-10-31 DIAGNOSIS — K5900 Constipation, unspecified: Secondary | ICD-10-CM | POA: Diagnosis present

## 2010-10-31 DIAGNOSIS — F17200 Nicotine dependence, unspecified, uncomplicated: Secondary | ICD-10-CM | POA: Diagnosis present

## 2010-10-31 DIAGNOSIS — J441 Chronic obstructive pulmonary disease with (acute) exacerbation: Principal | ICD-10-CM | POA: Diagnosis present

## 2010-10-31 DIAGNOSIS — B1910 Unspecified viral hepatitis B without hepatic coma: Secondary | ICD-10-CM | POA: Diagnosis present

## 2010-10-31 DIAGNOSIS — E669 Obesity, unspecified: Secondary | ICD-10-CM | POA: Diagnosis present

## 2010-10-31 DIAGNOSIS — E860 Dehydration: Secondary | ICD-10-CM | POA: Diagnosis present

## 2010-10-31 LAB — CBC
HCT: 38.5 % (ref 36.0–46.0)
Hemoglobin: 13 g/dL (ref 12.0–15.0)
MCH: 30.7 pg (ref 26.0–34.0)
MCHC: 33.8 g/dL (ref 30.0–36.0)
MCV: 91 fL (ref 78.0–100.0)
RBC: 4.23 MIL/uL (ref 3.87–5.11)

## 2010-10-31 LAB — POCT I-STAT, CHEM 8
BUN: 11 mg/dL (ref 6–23)
Creatinine, Ser: 1.6 mg/dL — ABNORMAL HIGH (ref 0.50–1.10)
Glucose, Bld: 101 mg/dL — ABNORMAL HIGH (ref 70–99)
Hemoglobin: 13.6 g/dL (ref 12.0–15.0)
TCO2: 22 mmol/L (ref 0–100)

## 2010-10-31 LAB — DIFFERENTIAL
Basophils Relative: 0 % (ref 0–1)
Lymphocytes Relative: 18 % (ref 12–46)
Lymphs Abs: 1.3 10*3/uL (ref 0.7–4.0)
Monocytes Absolute: 0.5 10*3/uL (ref 0.1–1.0)
Monocytes Relative: 7 % (ref 3–12)
Neutro Abs: 5.2 10*3/uL (ref 1.7–7.7)
Neutrophils Relative %: 74 % (ref 43–77)

## 2010-10-31 LAB — CK TOTAL AND CKMB (NOT AT ARMC): Relative Index: INVALID (ref 0.0–2.5)

## 2010-10-31 LAB — POCT I-STAT TROPONIN I

## 2010-11-01 LAB — DIFFERENTIAL
Basophils Relative: 0 % (ref 0–1)
Eosinophils Absolute: 0 10*3/uL (ref 0.0–0.7)
Lymphs Abs: 0.6 10*3/uL — ABNORMAL LOW (ref 0.7–4.0)
Monocytes Relative: 5 % (ref 3–12)
Neutro Abs: 5.6 10*3/uL (ref 1.7–7.7)
Neutrophils Relative %: 87 % — ABNORMAL HIGH (ref 43–77)

## 2010-11-01 LAB — COMPREHENSIVE METABOLIC PANEL
ALT: 12 U/L (ref 0–35)
AST: 17 U/L (ref 0–37)
CO2: 22 mEq/L (ref 19–32)
Chloride: 104 mEq/L (ref 96–112)
GFR calc Af Amer: 61 mL/min — ABNORMAL LOW (ref >90)
GFR calc non Af Amer: 53 mL/min — ABNORMAL LOW (ref >90)
Glucose, Bld: 138 mg/dL — ABNORMAL HIGH (ref 70–99)
Sodium: 139 mEq/L (ref 135–145)
Total Bilirubin: 0.2 mg/dL — ABNORMAL LOW (ref 0.3–1.2)

## 2010-11-01 LAB — CBC
Hemoglobin: 12 g/dL (ref 12.0–15.0)
MCH: 30.8 pg (ref 26.0–34.0)
MCV: 92.3 fL (ref 78.0–100.0)
Platelets: 135 10*3/uL — ABNORMAL LOW (ref 150–400)
RBC: 3.9 MIL/uL (ref 3.87–5.11)
WBC: 6.5 10*3/uL (ref 4.0–10.5)

## 2010-11-02 ENCOUNTER — Inpatient Hospital Stay (HOSPITAL_COMMUNITY): Payer: Medicare Other

## 2010-11-02 LAB — BASIC METABOLIC PANEL
Calcium: 9.8 mg/dL (ref 8.4–10.5)
GFR calc Af Amer: 52 mL/min — ABNORMAL LOW (ref >90)
GFR calc non Af Amer: 45 mL/min — ABNORMAL LOW (ref >90)
Potassium: 4.1 mEq/L (ref 3.5–5.1)
Sodium: 140 mEq/L (ref 135–145)

## 2010-11-02 LAB — CBC
MCH: 30.7 pg (ref 26.0–34.0)
MCHC: 33.1 g/dL (ref 30.0–36.0)
Platelets: 141 10*3/uL — ABNORMAL LOW (ref 150–400)
RDW: 13.7 % (ref 11.5–15.5)

## 2010-11-03 LAB — BASIC METABOLIC PANEL
CO2: 26 mEq/L (ref 19–32)
Calcium: 10.2 mg/dL (ref 8.4–10.5)
Potassium: 4.1 mEq/L (ref 3.5–5.1)
Sodium: 139 mEq/L (ref 135–145)

## 2010-11-03 LAB — CBC
MCH: 30.9 pg (ref 26.0–34.0)
Platelets: 154 10*3/uL (ref 150–400)
RBC: 3.92 MIL/uL (ref 3.87–5.11)
WBC: 6.8 10*3/uL (ref 4.0–10.5)

## 2010-11-10 NOTE — H&P (Signed)
[name], AULT NO.:  [id number]  MEDICAL RECORD NO.:  [id number]  LOCATION:  [facility]                         FACILITY:  [facility]  PHYSICIAN:  Carlota Raspberry, MD         DATE OF BIRTH:  05-18-1955  DATE OF ADMISSION:  10/31/2010 DATE OF DISCHARGE:                             HISTORY & PHYSICAL   PRIMARY CARE PHYSICIAN:  Dr. Babs Fields in [facility] physicians.  GI DOCTOR:  Janice Hey, MD in [facility] who treats her liver disease.  PULMONOLOGY:  The patient has previously seen Dr. Milon Fields has not seen him for quite a long time.  CHIEF COMPLAINT:  Cough, dyspnea,difficulty breathing, wheezing.  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  The patient is being admitted for presumed COPD exacerbation.  The patient states that a couple weeks ago she was in her usual state of health, but started developing a dry cough that has progressed over the past couple weeks to chest tightness, difficulty breathing, wheezing, and dyspnea.  This is consistent with prior episodes of COPD exacerbation.  She also offers that she is ready to quit smoking and has been asking for a nicotine patch.  She wants to get admitted to the hospital to help her get off of cigarettes.  She is also endorsing malaise and feeling run down, but denies any frank fevers, chills, night sweats, GI issues including nausea, vomiting, diarrhea and abdominal pain, cardiac issues including substernal chest pain, dizziness, lightheadedness, loss of consciousness, bladder or bowel issues, or skin issues.  She comes to the emergency room where initial vital signs were 110/73, pulse 89, respirations 37, and 98.5 temperature.  She was 93% on 3 liters.  However, in  discussion with the ED physician, she was actually 87% on 3 liters initially and brought up to 92% after several nebs.  She also was moving air better after nebs.  She was given ceftriaxone and azithromycin in the emergency room for a chest x-ray which showed a linear infiltration in the lung bases; however, this appears to be stable since prior study.  Of note, the patient was last admitted in May 2012 for respiratory failure with sats in the 80s, thought secondary to pneumonia and treated with ceftriaxone and azithromycin, transitioned to Avelox on discharge.  PAST MEDICAL HISTORY: 1. Severe COPD, still currently smoking one pack per day. 2. Hypertension. 3. 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  This is     being followed by Dr. Marcelene Fields. 4. Obesity. 5. Tobacco dependence.  HOME MEDICATIONS:  Reconciled with the pharmacist and include: 1. Doxepin 150 mg at bedtime. 2. Entecavir 0.5 mg 1 cap daily. 3. Percocet 5/325 one tab q.8 p.r.n. pain. 4. Pantoprazole 40 mg daily. 5. Nasacort nasal spray, 1-2 sprays every morning as needed for     congestion. 6. Metoprolol XL 25 mg 1 tab daily. 7. Loratadine 10 mg 1 tab daily as needed for allergies. 8. Lexapro  20 mg daily. 9. Iloperidone 6 mg 2 tabs every morning. 10.Docusate 100 mg b.i.d. p.r.n. constipation. 11.Celebrex 200 mg 1 cap daily. 12.Estazolam 2 mg 1 tab daily at bedtime. 13.Alprazolam 1 mg 1 tab t.i.d. 14.Soma 350 mg 1-2 tablets daily at bedtime. 15.Spironolactone 25 mg twice daily. 16.Verapamil female SR 180 mg 2 tablets every morning. 17.Viread (tenofovir) 300 mg 1 tab daily. 18.Aspirin 81 one tab daily. 19.Symbicort 160/4.5 mcg 2 puffs twice daily. 20.Spiriva 18 mcg 1 cap daily. 21.Albuterol inhaler 90 mcg 1 puff q.4 p.r.n. shortness of breath.  ALLERGIES LISTED:  To NEOMYCIN/BACITRACIN/POLYMYXIN, which causes a rash; GABAPENTIN, which causes a rash and CYMBALTA, which causes a headache.  SOCIAL  HISTORY:  She lives at home with her daughter and 3 grandchildren.  She has smoked a pack per day for the past 30 years and is previously quit for 1 and for 7 years.  She does not drink any alcohol, but did used to drink when she was younger, but quit 15-20 years ago.  She denies any drugs.  She denies any IV drug use.  FAMILY HISTORY:  Mother was deceased at 68 due to strokes.  Father deceased at 52 years' old due to lung cancer.  She has some sisters who are overall healthy.  PHYSICAL EXAMINATION:  VITAL SIGNS:  Blood pressure 98-117/60, currently 98/61, pulse 91, 94% on 3 liters nasal cannula. GENERAL:  She is a large lady in the ED stretcher who is alert, conversant and able to speak full sentences without difficulty breathing.  She overall appears nontoxic. HEENT:  pupils are equal, round, reactive to light and accommodation. Extraocular muscles are intact.  Her sclerae are clear.  Her mouth shows a very dry-appearing tongue, but there are no oropharyngeal or buccal lesions. LUNGS:  Have gross inspiratory rales bilaterally with expiratory coarse breath sounds and end-expiratory wheezes.  She is moving air moderately well, but her lungs sound quite abnormal. HEART:  Regular rate and rhythm with no murmurs or gallops appreciated. ABDOMEN:  Overweight but is soft, nontender, nondistended, and benign. EXTREMITIES:  Warm and well perfused with no cyanosis.  Her bilateral radials are easily palpable.  There is no bilateral lower extremity edema. SKIN:  Warm with no diaphoresis.  She has various tattoos on her right middle finger and on her left side of her chest. NEUROLOGICAL:  Grossly nonfocal.  She is alert, conversant, moving her extremities.  LABORATORY DATA:  White blood cell count is 7.0, hematocrit 38.5, platelets are 143.  Chemistry panel is normal except a BUN and creatinine of 11 and 1.6, which is just above prior baseline of 1.2-1.4. Troponin point-of-care is negative x1.   CK and MB are unremarkable.  Chest x-ray shows a linear infiltration or atelectasis in the lung bases, which is stable since prior study.  EKG is normal sinus rhythm with normal axis, P-waves are overall unimpressive.  QRSs are narrow at 92 milliseconds.  There is slightly delayed R-wave precordial progression, ST-T junctions are not deviated, T-waves are all appropriate.  Overall in comparison to prior of May 2012, there is no interval change and overall this is an unimpressive EKG.  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 1. Chronic obstructive pulmonary disease exacerbation.  We will treat     with scheduled nebs, antibiotics (levofloxacin for cath) and 60 mg     of p.o. prednisone daily.  The patient states that prednisone has     previously called her to swell up and she is a bit hesitant, but I     discussed that we will treat for now but have a low threshold if     she is doing well to stop it early.  She is experiencing side     effects, which she is okay with.  We will continue her home     tiotropium and Symbicort.  She is currently satting in the low to mid 90s on 3 liters of nasal cannula and appears quite comfortable.  I extensively counseled her that she really needs to stop smoking and we will give her nicotine patches while she is in-house.  We will request a tobacco cessation consultation. 1. Low blood pressures.  Blood pressures have been ranging in the 90s     to low 100s through her course in the ED, however, she is mentating     completely well and is making urine output.  There may be some     element of hypovolemia.  She is also on a handful blood pressure      medications, which we will hold for now given the soft blood     pressures.  We will continue to monitor these and give her some IV     fluids and trend them. 2. Cirrhosis.  She is currently on tenofovir and entecavir, which we     will continue while she is admitted.  We will hold the     spironolactone given the soft blood pressures though. 3. History of hypertension.  As above, currently running a bit low.     We will hold her blood pressure meds for now.  We will continue her     home 81 of aspirin though. 4. Home medication reconciliation.  We will continue various     antidepressants, antianxiety, and pain medication regimen. 5. Acute-on-chronic renal insufficiency.  Her creatinine is currently     1.6 with a prior baseline of 1.2-1.4.  We will give her some IV     fluids overnight and trend this out. 6. Fluid, electrolytes, and nutrition.  Two liters of normal saline at     maintenance rates overnight, heart healthy diet. 7. Intravenous access.  She has a peripheral IV. 8. Prophylaxis.  Subcutaneous heparin unless she is ambulatory,     Zofran, and Tylenol for pain or fevers.  CODE STATUS:  She is full code and would want to be intubated should she become unstable.  I have discussed this with her.  She will be admitted to telemetry bed to [facility] team 7.         ______________________________ Carlota Raspberry, MD    EB/MEDQ  D:  10/31/2010  T:  10/31/2010  Job:  [id number]  Electronically Signed by Carlota Raspberry MD on 11/10/2010 07:47:20 PM

## 2010-11-13 ENCOUNTER — Observation Stay (HOSPITAL_COMMUNITY)
Admission: EM | Admit: 2010-11-13 | Discharge: 2010-11-14 | Disposition: A | Discharge status: Home / Self Care | Payer: Medicare Other | Attending: Emergency Medicine | Admitting: Emergency Medicine

## 2010-11-13 ENCOUNTER — Emergency Department (HOSPITAL_COMMUNITY): Payer: Medicare Other

## 2010-11-13 DIAGNOSIS — J4489 Other specified chronic obstructive pulmonary disease: Principal | ICD-10-CM | POA: Insufficient documentation

## 2010-11-13 DIAGNOSIS — F17200 Nicotine dependence, unspecified, uncomplicated: Secondary | ICD-10-CM | POA: Insufficient documentation

## 2010-11-13 DIAGNOSIS — I10 Essential (primary) hypertension: Secondary | ICD-10-CM | POA: Insufficient documentation

## 2010-11-13 DIAGNOSIS — J449 Chronic obstructive pulmonary disease, unspecified: Secondary | ICD-10-CM | POA: Insufficient documentation

## 2010-11-13 LAB — POCT I-STAT, CHEM 8
Chloride: 106 mEq/L (ref 96–112)
Creatinine, Ser: 1.7 mg/dL — ABNORMAL HIGH (ref 0.50–1.10)
HCT: 41 % (ref 36.0–46.0)
Hemoglobin: 13.9 g/dL (ref 12.0–15.0)
Potassium: 3.6 mEq/L (ref 3.5–5.1)
Sodium: 142 mEq/L (ref 135–145)

## 2010-11-14 ENCOUNTER — Ambulatory Visit: Payer: Medicare Other | Admitting: Gastroenterology

## 2010-11-14 LAB — DIFFERENTIAL
Basophils Absolute: 0 10*3/uL (ref 0.0–0.1)
Basophils Relative: 0 % (ref 0–1)
Eosinophils Relative: 1 % (ref 0–5)
Monocytes Absolute: 0.2 10*3/uL (ref 0.1–1.0)
Neutro Abs: 2.1 10*3/uL (ref 1.7–7.7)

## 2010-11-14 LAB — PRO B NATRIURETIC PEPTIDE: Pro B Natriuretic peptide (BNP): 59.2 pg/mL (ref 0–125)

## 2010-11-14 LAB — POCT I-STAT 3, ART BLOOD GAS (G3+)
Bicarbonate: 19.1 mEq/L — ABNORMAL LOW (ref 20.0–24.0)
O2 Saturation: 90 %
TCO2: 20 mmol/L (ref 0–100)
pCO2 arterial: 33.8 mmHg — ABNORMAL LOW (ref 35.0–45.0)
pH, Arterial: 7.359 (ref 7.350–7.400)
pO2, Arterial: 60 mmHg — ABNORMAL LOW (ref 80.0–100.0)

## 2010-11-14 LAB — PROTIME-INR: Prothrombin Time: 13.1 seconds (ref 11.6–15.2)

## 2010-11-14 LAB — CBC
MCHC: 33.1 g/dL (ref 30.0–36.0)
RDW: 13.6 % (ref 11.5–15.5)

## 2010-11-14 LAB — POCT I-STAT TROPONIN I

## 2010-11-18 ENCOUNTER — Emergency Department (HOSPITAL_COMMUNITY): Payer: Medicare Other

## 2010-11-18 ENCOUNTER — Emergency Department (HOSPITAL_COMMUNITY)
Admission: EM | Admit: 2010-11-18 | Discharge: 2010-11-18 | Disposition: A | Discharge status: Home / Self Care | Payer: Medicare Other | Source: Home / Self Care | Attending: Emergency Medicine | Admitting: Emergency Medicine

## 2010-11-18 ENCOUNTER — Emergency Department (HOSPITAL_COMMUNITY)
Admission: EM | Admit: 2010-11-18 | Discharge: 2010-11-19 | Disposition: A | Discharge status: Home / Self Care | Payer: Medicare Other | Source: Home / Self Care | Attending: Emergency Medicine | Admitting: Emergency Medicine

## 2010-11-18 DIAGNOSIS — F29 Unspecified psychosis not due to a substance or known physiological condition: Secondary | ICD-10-CM | POA: Insufficient documentation

## 2010-11-18 DIAGNOSIS — R0602 Shortness of breath: Secondary | ICD-10-CM | POA: Insufficient documentation

## 2010-11-18 DIAGNOSIS — Z8619 Personal history of other infectious and parasitic diseases: Secondary | ICD-10-CM | POA: Insufficient documentation

## 2010-11-18 DIAGNOSIS — S82853A Displaced trimalleolar fracture of unspecified lower leg, initial encounter for closed fracture: Secondary | ICD-10-CM | POA: Insufficient documentation

## 2010-11-18 DIAGNOSIS — J4489 Other specified chronic obstructive pulmonary disease: Secondary | ICD-10-CM | POA: Insufficient documentation

## 2010-11-18 DIAGNOSIS — R4182 Altered mental status, unspecified: Secondary | ICD-10-CM | POA: Insufficient documentation

## 2010-11-18 DIAGNOSIS — R0989 Other specified symptoms and signs involving the circulatory and respiratory systems: Secondary | ICD-10-CM | POA: Insufficient documentation

## 2010-11-18 DIAGNOSIS — X500XXA Overexertion from strenuous movement or load, initial encounter: Secondary | ICD-10-CM | POA: Insufficient documentation

## 2010-11-18 DIAGNOSIS — K7460 Unspecified cirrhosis of liver: Secondary | ICD-10-CM | POA: Insufficient documentation

## 2010-11-18 DIAGNOSIS — R141 Gas pain: Secondary | ICD-10-CM | POA: Insufficient documentation

## 2010-11-18 DIAGNOSIS — R05 Cough: Secondary | ICD-10-CM | POA: Insufficient documentation

## 2010-11-18 DIAGNOSIS — J449 Chronic obstructive pulmonary disease, unspecified: Secondary | ICD-10-CM | POA: Insufficient documentation

## 2010-11-18 DIAGNOSIS — Z79899 Other long term (current) drug therapy: Secondary | ICD-10-CM | POA: Insufficient documentation

## 2010-11-18 DIAGNOSIS — Y9269 Other specified industrial and construction area as the place of occurrence of the external cause: Secondary | ICD-10-CM | POA: Insufficient documentation

## 2010-11-18 DIAGNOSIS — R000 Tachycardia, unspecified: Secondary | ICD-10-CM | POA: Insufficient documentation

## 2010-11-18 DIAGNOSIS — R059 Cough, unspecified: Secondary | ICD-10-CM | POA: Insufficient documentation

## 2010-11-18 DIAGNOSIS — I10 Essential (primary) hypertension: Secondary | ICD-10-CM | POA: Insufficient documentation

## 2010-11-18 DIAGNOSIS — W19XXXA Unspecified fall, initial encounter: Secondary | ICD-10-CM | POA: Insufficient documentation

## 2010-11-18 DIAGNOSIS — R142 Eructation: Secondary | ICD-10-CM | POA: Insufficient documentation

## 2010-11-18 LAB — URINALYSIS, ROUTINE W REFLEX MICROSCOPIC
Glucose, UA: NEGATIVE mg/dL
Leukocytes, UA: NEGATIVE
Nitrite: NEGATIVE
Specific Gravity, Urine: 1.026 (ref 1.005–1.030)
pH: 5.5 (ref 5.0–8.0)

## 2010-11-18 LAB — APTT: aPTT: 23 seconds — ABNORMAL LOW (ref 24–37)

## 2010-11-18 LAB — DIFFERENTIAL
Lymphocytes Relative: 8 % — ABNORMAL LOW (ref 12–46)
Lymphs Abs: 0.8 10*3/uL (ref 0.7–4.0)
Monocytes Relative: 5 % (ref 3–12)
Neutro Abs: 8.8 10*3/uL — ABNORMAL HIGH (ref 1.7–7.7)
Neutrophils Relative %: 86 % — ABNORMAL HIGH (ref 43–77)

## 2010-11-18 LAB — CBC
Hemoglobin: 14.3 g/dL (ref 12.0–15.0)
MCH: 31.7 pg (ref 26.0–34.0)
MCV: 93.8 fL (ref 78.0–100.0)
Platelets: 92 10*3/uL — ABNORMAL LOW (ref 150–400)
RBC: 4.51 MIL/uL (ref 3.87–5.11)
WBC: 10.2 10*3/uL (ref 4.0–10.5)

## 2010-11-18 LAB — ETHANOL: Alcohol, Ethyl (B): <11 mg/dL (ref 0–11)

## 2010-11-18 LAB — COMPREHENSIVE METABOLIC PANEL
ALT: 12 U/L (ref 0–35)
AST: 10 U/L (ref 0–37)
Alkaline Phosphatase: 94 U/L (ref 39–117)
CO2: 27 mEq/L (ref 19–32)
Calcium: 9.6 mg/dL (ref 8.4–10.5)
Glucose, Bld: 120 mg/dL — ABNORMAL HIGH (ref 70–99)
Potassium: 3.8 mEq/L (ref 3.5–5.1)
Sodium: 137 mEq/L (ref 135–145)
Total Protein: 7 g/dL (ref 6.0–8.3)

## 2010-11-18 LAB — LACTIC ACID, PLASMA: Lactic Acid, Venous: 1.7 mmol/L (ref 0.5–2.2)

## 2010-11-18 LAB — POCT I-STAT 3, VENOUS BLOOD GAS (G3P V)
Bicarbonate: 29.7 mEq/L — ABNORMAL HIGH (ref 20.0–24.0)
Patient temperature: 97.7
TCO2: 31 mmol/L (ref 0–100)
pCO2, Ven: 47.9 mmHg (ref 45.0–50.0)
pH, Ven: 7.398 — ABNORMAL HIGH (ref 7.250–7.300)

## 2010-11-18 LAB — PROCALCITONIN: Procalcitonin: <0.1 ng/mL

## 2010-11-18 LAB — POCT I-STAT TROPONIN I

## 2010-11-18 LAB — RAPID URINE DRUG SCREEN, HOSP PERFORMED
Barbiturates: NOT DETECTED
Benzodiazepines: POSITIVE — AB
Cocaine: POSITIVE — AB
Opiates: NOT DETECTED

## 2010-11-19 LAB — URINE CULTURE
Colony Count: NO GROWTH
Culture: NO GROWTH

## 2010-11-20 LAB — CULTURE, BLOOD (ROUTINE X 2): Culture  Setup Time: 201210261356

## 2010-11-21 ENCOUNTER — Emergency Department (HOSPITAL_COMMUNITY): Payer: Medicare Other

## 2010-11-21 ENCOUNTER — Inpatient Hospital Stay (HOSPITAL_COMMUNITY)
Admission: EM | Admit: 2010-11-21 | Discharge: 2010-11-30 | DRG: 907 | Disposition: A | Discharge status: Skilled Nursing Facility | Payer: Medicare Other | Attending: Internal Medicine | Admitting: Internal Medicine

## 2010-11-21 ENCOUNTER — Encounter: Payer: Self-pay | Admitting: Internal Medicine

## 2010-11-21 DIAGNOSIS — S82853A Displaced trimalleolar fracture of unspecified lower leg, initial encounter for closed fracture: Secondary | ICD-10-CM | POA: Diagnosis present

## 2010-11-21 DIAGNOSIS — I10 Essential (primary) hypertension: Secondary | ICD-10-CM | POA: Diagnosis present

## 2010-11-21 DIAGNOSIS — I2699 Other pulmonary embolism without acute cor pulmonale: Secondary | ICD-10-CM

## 2010-11-21 DIAGNOSIS — N189 Chronic kidney disease, unspecified: Secondary | ICD-10-CM | POA: Diagnosis present

## 2010-11-21 DIAGNOSIS — E669 Obesity, unspecified: Secondary | ICD-10-CM | POA: Diagnosis present

## 2010-11-21 DIAGNOSIS — F411 Generalized anxiety disorder: Secondary | ICD-10-CM | POA: Diagnosis present

## 2010-11-21 DIAGNOSIS — T400X1A Poisoning by opium, accidental (unintentional), initial encounter: Secondary | ICD-10-CM | POA: Diagnosis present

## 2010-11-21 DIAGNOSIS — N179 Acute kidney failure, unspecified: Secondary | ICD-10-CM | POA: Diagnosis not present

## 2010-11-21 DIAGNOSIS — F1410 Cocaine abuse, uncomplicated: Secondary | ICD-10-CM | POA: Diagnosis present

## 2010-11-21 DIAGNOSIS — D500 Iron deficiency anemia secondary to blood loss (chronic): Secondary | ICD-10-CM | POA: Diagnosis present

## 2010-11-21 DIAGNOSIS — T40601A Poisoning by unspecified narcotics, accidental (unintentional), initial encounter: Principal | ICD-10-CM | POA: Diagnosis present

## 2010-11-21 DIAGNOSIS — I129 Hypertensive chronic kidney disease with stage 1 through stage 4 chronic kidney disease, or unspecified chronic kidney disease: Secondary | ICD-10-CM | POA: Diagnosis present

## 2010-11-21 DIAGNOSIS — S82892A Other fracture of left lower leg, initial encounter for closed fracture: Secondary | ICD-10-CM

## 2010-11-21 DIAGNOSIS — I959 Hypotension, unspecified: Secondary | ICD-10-CM | POA: Diagnosis present

## 2010-11-21 DIAGNOSIS — W108XXA Fall (on) (from) other stairs and steps, initial encounter: Secondary | ICD-10-CM | POA: Diagnosis present

## 2010-11-21 DIAGNOSIS — F17200 Nicotine dependence, unspecified, uncomplicated: Secondary | ICD-10-CM | POA: Diagnosis present

## 2010-11-21 DIAGNOSIS — D696 Thrombocytopenia, unspecified: Secondary | ICD-10-CM | POA: Diagnosis present

## 2010-11-21 DIAGNOSIS — Z86718 Personal history of other venous thrombosis and embolism: Secondary | ICD-10-CM

## 2010-11-21 DIAGNOSIS — K7460 Unspecified cirrhosis of liver: Secondary | ICD-10-CM | POA: Diagnosis present

## 2010-11-21 DIAGNOSIS — D649 Anemia, unspecified: Secondary | ICD-10-CM | POA: Diagnosis present

## 2010-11-21 DIAGNOSIS — B1910 Unspecified viral hepatitis B without hepatic coma: Secondary | ICD-10-CM | POA: Diagnosis present

## 2010-11-21 DIAGNOSIS — E860 Dehydration: Secondary | ICD-10-CM | POA: Diagnosis present

## 2010-11-21 DIAGNOSIS — D509 Iron deficiency anemia, unspecified: Secondary | ICD-10-CM | POA: Diagnosis present

## 2010-11-21 DIAGNOSIS — J441 Chronic obstructive pulmonary disease with (acute) exacerbation: Secondary | ICD-10-CM | POA: Diagnosis present

## 2010-11-21 HISTORY — DX: Chronic obstructive pulmonary disease, unspecified: J44.9

## 2010-11-21 HISTORY — DX: Essential (primary) hypertension: I10

## 2010-11-21 HISTORY — DX: Unspecified cirrhosis of liver: K74.60

## 2010-11-21 LAB — URINALYSIS, ROUTINE W REFLEX MICROSCOPIC
Glucose, UA: NEGATIVE mg/dL
Leukocytes, UA: NEGATIVE
Protein, ur: NEGATIVE mg/dL
Specific Gravity, Urine: 1.023 (ref 1.005–1.030)
Urobilinogen, UA: 1 mg/dL (ref 0.0–1.0)

## 2010-11-21 LAB — ETHANOL: Alcohol, Ethyl (B): <11 mg/dL (ref 0–11)

## 2010-11-21 LAB — COMPREHENSIVE METABOLIC PANEL
Alkaline Phosphatase: 91 U/L (ref 39–117)
BUN: 24 mg/dL — ABNORMAL HIGH (ref 6–23)
Creatinine, Ser: 2.08 mg/dL — ABNORMAL HIGH (ref 0.50–1.10)
GFR calc Af Amer: 30 mL/min — ABNORMAL LOW (ref >90)
Glucose, Bld: 103 mg/dL — ABNORMAL HIGH (ref 70–99)
Potassium: 3.8 mEq/L (ref 3.5–5.1)
Total Bilirubin: 0.6 mg/dL (ref 0.3–1.2)
Total Protein: 6.5 g/dL (ref 6.0–8.3)

## 2010-11-21 LAB — AMMONIA: Ammonia: 22 umol/L (ref 11–60)

## 2010-11-21 LAB — CK TOTAL AND CKMB (NOT AT ARMC)
CK, MB: 16 ng/mL (ref 0.3–4.0)
Relative Index: 1.2 (ref 0.0–2.5)

## 2010-11-21 LAB — RAPID URINE DRUG SCREEN, HOSP PERFORMED
Barbiturates: NOT DETECTED
Cocaine: POSITIVE — AB

## 2010-11-21 LAB — CBC
Hemoglobin: 11 g/dL — ABNORMAL LOW (ref 12.0–15.0)
MCHC: 33.2 g/dL (ref 30.0–36.0)
RDW: 13.7 % (ref 11.5–15.5)
WBC: 9.6 10*3/uL (ref 4.0–10.5)

## 2010-11-21 LAB — DIFFERENTIAL
Basophils Absolute: 0 10*3/uL (ref 0.0–0.1)
Basophils Relative: 0 % (ref 0–1)
Monocytes Absolute: 1 10*3/uL (ref 0.1–1.0)
Neutro Abs: 7.5 10*3/uL (ref 1.7–7.7)

## 2010-11-21 LAB — TROPONIN I: Troponin I: <0.3 ng/mL (ref <0.30)

## 2010-11-21 NOTE — Discharge Summary (Signed)
NAMEJOSAPHINE, [name] NO.:  [id number]  MEDICAL RECORD NO.:  [id number]  LOCATION:  4740                         FACILITY:  [facility]  PHYSICIAN:  Mauro Kaufmann, MD         DATE OF BIRTH:  1955-04-06  DATE OF ADMISSION:  10/31/2010 DATE OF DISCHARGE:  11/03/2010                              DISCHARGE SUMMARY   ADMISSION DIAGNOSES: 1. Chronic obstructive pulmonary disease exacerbation. 2. Hypertension. 3. Liver cirrhosis. 4. History of hypertension. 5. Acute on chronic renal insufficiency.  DISCHARGE DIAGNOSES: 1. Chronic obstructive pulmonary disease exacerbation, improved.  The     patient qualifies for home oxygen, but refusing home oxygen at this     time. 2. Hypotension, resolved. 3. Liver cirrhosis. 4. History of hypertension. 5. Ongoing tobacco abuse. 6. History of hepatitis B. 7. History of depression.  TESTS PERFORMED DURING THE HOSPITAL STAY: 1. Chest x-ray on October 8 showed linear infiltration or atelectasis     in the lung bases, stable since prior study. 2. Chest x-ray on October 10 showed minimal bibasilar atelectasis     which improved since prior study.  PERTINENT LABS:  On the day of admission, the patient's BUN and creatinine was 11/1.60.  On the day of discharge is 18.1 and 1.15.  BRIEF HISTORY AND PHYSICAL:  This is a 55 year old female with a history of severe COPD, currently still smoking cigarettes with history of cirrhosis with hepatitis B, hypertension, came to the hospital with several weeks of cough, dyspnea, and difficulty breathing.  The patient was admitted for presumed COPD exacerbation.  The patient was started on prednisone and antibiotics.  BRIEF HOSPITAL COURSE: 1. COPD exacerbation.  The patient was started on Levaquin and     prednisone taper, has improved.  Also, the patient was found to be     hypoxic on walking with O2 sats dropping to 87% on room air.  The     patient will qualify for home oxygen.  She was  offered home oxygen,     but the patient at this time refused home O2.  She says that she     has a tank at home which is her husband's tank and she will use     that one.  The patient was again offered oxygen, but she has     plainly refused at this time. 2. Ongoing tobacco use.  The patient says that she was counseled for     tobacco abuse, and the patient says that she will quit tobacco at     this time when she goes home. 3. Hypertension.  The patient has history of hypertension.  She is on     multiple antihypertensive medications.  I am going to hold the     patient's verapamil at this time because the patient was     hypotensive when she came to the hospital. 4. Constipation.  The patient will be given MiraLax to be taken every     day for next 7 days when she goes home.  MEDICATIONS AT DISCHARGE: 1. Levaquin 500 mg p.o. daily. 2. MiraLax 17 g p.o. daily. 3. Prednisone 40  mg p.o. daily for one day, then prednisone 30 mg p.o.     daily for one day, then prednisone 20 mg p.o. daily for one day,     then prednisone 10 mg p.o. daily for one day, then prednisone 5 mg     p.o. daily for one day, then stop. 4. Albuterol inhaler 90 mcg one puff, inhaled every 4 hour as needed. 5. Aspirin enteric-coated 81 mg p.o. daily. 6. Entecavir 0.5 mg one capsule daily. 7. Celebrex 200 mg one capsule daily. 8. Doxepin 150 mg p.o. daily at bedtime. 9. Docusate 100 mg one capsule p.o. twice daily as needed. 10.Alprazolam 2 mg one tablet p.o. daily at bedtime. 11.Fanapt two tablets 6 mg p.o. every morning. 12.Lexapro 20 mg one tablet p.o. daily. 13.Loratadine 10 mg p.o. daily as needed. 14.Metoprolol-XL 25 mg one tablet p.o. daily. 15.Nasacort one to two sprays nasally every morning as needed. 16.Pantoprazole 40 mg p.o. daily. 17.Percocet one tablet p.o. every 8 hours as needed. 18.Soma 350 mg one to two tablets by mouth daily at bedtime. 19.Spiriva 18 mcg one capsule inhaled  daily. 20.Spironolactone 25 mg one tablet p.o. twice a day. 21.Symbicort two puffs inhaled twice a day. 22.Viread 300 mg one tablet p.o. daily. 23.Xanax 1 mg one tablet p.o. t.i.d. as needed.  The patient will follow up with Dr. Vinnie Level in 2 weeks time.     Mauro Kaufmann, MD     GL/MEDQ  D:  11/03/2010  T:  11/03/2010  Job:  [id number]  cc:   Vinnie Level, MD [name], MD, [facility]  Electronically Signed by Mauro Kaufmann  on 11/21/2010 09:44:07 AM

## 2010-11-21 NOTE — H&P (Signed)
Hospital Admission Note Date: 11/21/2010  Patient name: [name] Medical record number: [id number] Date of birth: 04-22-1955 Age: 55 y.o. Gender: female PCP: [name] in [facility]  Medical Service: Internal Medicine Teaching Service  Attending physician: Dr. Blanch Media    1st Contact: Dr. Blanca Friend  Pager: [phone] 2nd Contact: Dr. Elyse Jarvis  Pager: [phone] After 5 pm or weekends: 1st Contact:      Pager: [phone] 2nd Contact:      Pager: [phone]  Chief Complaint: SOB, Dyspnea, CP, altered mental status  History of Present Illness: Patient is a 55 yo woman with h/o severe COPD, HTN, cirrhosis and recent ankle fracture p/w acute onset confusion as per her fiancee, who reports that she was sweating & nervous.  She says that she was confused, but was able to call 911 as symptoms of CP worsened.  She has recently been under significant pain of her left ankle that she fractured in October 2012 (presented to [facility] on 11/18/10, ortho was consulted and did not see necessity for surgical intervention, ankle was wrapped) and has taken 20 percocet (5-325 per ED note 11/18/10) in 3 days.  She reports that she has not taken any percocet on the day of admission, and it is unclear if she took any pain medications on the day prior to admission.  On the day of admission, in addition to confusion/diaphoresis, she presents with 7/10, sharp, dull, nonradiating, intermittent mid substernal CP that has also been ongoing for 1 month and lasts for about 5-10 minutes/episode.  She notes similar sensation about 1 year ago, for which a cardiologist told her that she had a thick left ventricle.  She has not history of MI.  She denies alleviating or worsening factors for CP.  She endorses worsening nonproductive cough & wheezing over the last month.  She was admitted Oct 8-Nov 04, 2010 and treated for COPD exacerbation with levaquin & prednisone taper.  She reports completing the course of  both regimens.  She reports some relief of SOB with use of her albuterol inhaler every 4 hours.  Patient reports compliance with COPD medications.  She denies fever/chills and has not been around sick contacts.    Meds: Xanax 1 mg by mouth 3 times a day as needed Tenofovir 300 mg by mouth daily;  Symbicort 160/4.5 mcg inhaled 2 puffs twice daily Spironolactone 25 mg by mouth twice daily  Spiriva 18 mcg inhaled 1 Daily  Soma 350 mg by mouth 1-2 tab bedtime Albuterol 2 puffs daily as needed Percocet 10/325 mg by mouth 1-2 tabs every 4 hours as needed Pantoprazole 40 mg by mouth twice daily Metoprolol XL succinate 25 mg by mouth 1 tab daily  loratadine 10 mg 1 tab daily as needed Lexapro 20 mg 1 tab daily Iloperidone 6 mg by mouth 2 tabs every morning estazolam 2 mg by mouth 1 tab daily at bedtime Doxepin 150 mg by mouth 1 capsule at bedtime Colace 100 mg twice a day Citalopram 20 mg by mouth 1 tab daily Celebrex 200 mg by mouth 1 capsule daily Entecavir 0.5 mg by mouth daily Aspirin enteric-coated 81 mg by mouth daily    Allergies: Cymbalta-headache  Neosporin-rash Neurontin-rash  Past medical history #1 COPD  #2 hypertension  #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  #4 obesity  #5 polysubstance abuse- tobacco, cocaine, and marijuana  Past Surgical History:  Sling cystourethropexy plus cystoscopy - Feb 2011  Sling cystourethropexy - May 2011  Removal of teeth numbers 13, 18, 20, 23, 25, 26, 30, and 31 and alveoplasty of right and left mandible.   Family history-mother was deceased at 44 due to stroke; father was deceased at 82 years old due to lung cancer; sisters who are generally healthy  Social History  . Marital Status: Single, engaged    Social History Main Topics  . Smoking status:  1 PPD for 30 years   . Smokeless tobacco: Not on file  . Alcohol Use:  none   . Drug Use:  reports marijuana and cocaine use 2 months  prior   . Sexually Active:  denies sexual activity    Review of Systems: General: no fevers, chills, changes in weight, changes in appetite Skin: no rash HEENT: no blurry vision, hearing changes, sore throat Pulm: as per HPI CV: as per HPI Abd: no abdominal pain, nausea/vomiting, diarrhea/constipation GU: no dysuria, hematuria, polyuria Ext: no new arthralgias, myalgias Neuro: no weakness, numbness, or tingling  Physical Exam: Vitals: T: 98.5  HR: 85   BP:88/59   RR: 18  O2 saturation: 95% on 2L, 88% on RA and Patient received 2L in ED prior to admission, 2 L after admission General: resting in bed, no acute distress HEENT: PERRL, EOMI, no scleral icterus, mild conjunctival pallor, no conjunctival injection, dry mucus membranes Cardiac: RRR, no rubs, murmurs or gallops, heart sounds difficult to appreciate 2/2 habitus & respiratory sounds Pulm: bilateral course breath sounds with prolonged expiratory wheezing Abd: soft, nontender, obese, nondistended, BS normoactive Ext: warm and well perfused, no pedal edema, left leg wrapped in cast 2/2 ankle fracture Neuro: alert and oriented X3, cranial nerves II-XII grossly intact, strength and sensation to light touch equal in bilateral upper and lower extremities  Lab results: Basic Metabolic Panel:  Basename 11/21/10 1653  NA 132*  K 3.8  CL 96  CO2 26  GLUCOSE 103*  BUN 24*  CREATININE 2.08*  CALCIUM 8.9  MG --  PHOS --   Liver Function Tests:  Basename 11/21/10 1653  AST 42*  ALT 22  ALKPHOS 91  BILITOT 0.6  PROT 6.5  ALBUMIN 2.8*    Basename 11/21/10 1653  AMMONIA 22   CBC:  Basename 11/21/10 1653  WBC 9.6  NEUTROABS 7.5  HGB 11.0*  HCT 33.1*  MCV 94.0  PLT 75*   Alcohol Level:  Basename 11/21/10 1653  ETH <11   Urinalysis:  Color, Urine                             AMBER      a      YELLOW    BIOCHEMICALS MAY BE AFFECTED BY COLOR  Appearance                               CLOUDY     a      CLEAR   Specific Gravity                         1.023             1.005-1.030  pH  5.0               5.0-8.0  Urine Glucose                            NEGATIVE          NEG              mg/dL  Bilirubin                                SMALL      a      NEG  Ketones                                  NEGATIVE          NEG              mg/dL  Blood                                    NEGATIVE          NEG  Protein                                  NEGATIVE          NEG              mg/dL  Urobilinogen                             1.0               0.0-1.0          mg/dL  Nitrite                                  NEGATIVE          NEG  Leukocytes                               NEGATIVE          NEG    MICROSCOPIC NOT DONE ON URINES WITH NEGATIVE PROTEIN, BLOOD, LEUKOCYTES,    NITRITE, OR GLUCOSE <1000 mg/dL.  Imaging results:  11/21/2010 CHEST - 2 VIEW  Comparison: 11/18/2010   Findings:  Increase in bibasilar airspace disease, right greater than left.  This may represent atelectasis or pneumonia.  Negative for heart failure or effusion.   IMPRESSION: Increase in bibasilar atelectasis/pneumonia.    Assessment & Plan by Problem: 55 yo woman with PMH of HTN, cirrhosis 2/2 Hep B, recent ankle fracture and severe COPD p/w  #Altered mental status: Most likely 2/2 opiate overdose, as patient's mental status significantly improved after treatment with Narcan (0.4mg , 1mg , 0.4mg , 1mg ) and copious IVF.  Given history of cirrhosis, AMS 2/2 elevated ammonia levels was ruled out.   Plan: Continue IVF with 2 large bore PIV (5L bolus, then NS @ 590ml/h x 1L then NS @ 200 ml/h), avoid opiates until vitals better stabilized, will hold all psychotropic medications until patient is clinically  improved, admit to SDU   #Hypotension: likely 2/2 to percocet overdose causing venodilation combined with poor PO intake.  Patient has history of HTN at baseline and is treated outpatient with  metoprolol. Plan: as above, hold antihypertensive agents  #Chest Pain/Dyspnea/SOB:  Given history of recent admission in addition to current symptoms patient most likely has increasing chest pain and SOB secondary to COPD exacerbation.  Given risk factors of hypertension, cocaine use and tobacco abuse it is necessary to rule out myocardial infarction.  The chest x-ray suggests the possibility of pneumonia, though it is unlikely given the patient has been afebrile, does not exhibit leukocytosis, and has not had any sick contacts.  Patient's revised Geneva  score is 5 suggesting intermediate probability of pulmonary embolus.  CP does not seem related to meals, though given habitus, it is possible that symptoms may be exacerbated by GERD. Echo on 11/01/10 suggests normal systolic function (EF=55-60%) with grade 1 diastolic dysfunction.  History and physical exam does not suggest fluid overload, and thus suspicion of CHF exacerbation is low. Plan: -Treat with Doxy, Prednisone, Atrovent/Albuterol nebs, supplemental O2 (wean to 90% O2sat) -EKG, CE x 1 -Ddimer, V/Q scan if elevated d dimer -Protonix    -Strict I's/O's with daily weights  #Polysubstance abuse: patient denies recent cocaine/marijuana use, but UDS is positive for cocaine.  She does endorse tobacco abuse, and requests a nicotine patch.   Plan: CIWA obs, smoking cessation consult, Nicotine patch 21mg  daily, EtOH level, social work for substance abuse counseling.  Metoprolol noted on pts home meds, would consider discontinuing this given ongoing cocaine use.  #Acute on Chronic Renal Failure:  Patient's baseline Cr appears to be about 1.5.  It does not appear that renal function has been worked up in the past.  FeNa is 0.17, suggesting acute pre-renal etiology caused by hypotension secondary to Percocet overdose that is exacerbating chronic renal failure, which is likely secondary to patient's history of cirrhosis (albumin=2.8) +/- HTN.  The patient  reports unchanged appetite, though given recent altered mental status, it is likely that she has not maintained adequate by mouth intake.  Urine sediment was not done as UA was normal, and it is unlikely that patient is has tubular disease.  Microalbumin may be considered for further workup once acute condition has resolved to evaluate glomerular function, though protein was negative on UA.  Patient seems to be urinating without complaints, so post renal etiology is unlikely.   Plan-Continue to monitor with IVF re-hydration.  Repeat CMET in am.  #Normocytic Anemia: Given worsening renal function, anemia (near baseline of 12) may be 2/2 decreased EPO production.  Patient denies recent blood loss in BM, urine or hemetemesis/hemoptysis.  Hb on Oct 26 was 14.3, though that may have been hemoconcentrated.  I do not know if she has had recent colonoscopy, but given that MCV is high normal, it is less likely that patient has iron deficiency/blood loss.  Patient has history of EtOH abuse, and given high normal MCV, patient may have folate or B12 deficiency.   Plan: Anemia Panel, FOBT x 3  #Hepatits B/Cirrhosis: Patient reports compliance with hepB medications.  AST is elevated, and above baseline, & ALT is near baseline.  Patient may have recently had EtOH.  Albumin is low, and has dropped since Sept 2012.  Patient's cirrhosis may be worsening.   Plan: Contact Dr. Baltazar Najjar office to inform of hospitalization and identify if patient has been adequately following up. Continue Tenofovir & Entecavir  VTE Prophylaxis: Lovenox 40mg  (Cr clearance is not less than 30) [place] daily   R3______________________________ (Dr. Nelda Bucks, [phone])   R1________________________________ (Dr. Vernice Jefferson,  [phone])  ATTENDING: I performed and/or observed a history and physical examination of the patient.  I discussed the case with the residents as noted and reviewed the residents' notes.  I agree with the findings  and plan--please refer to the attending physician note for more details.  Signature________________________________  Printed Name_____________________________

## 2010-11-22 ENCOUNTER — Inpatient Hospital Stay (HOSPITAL_COMMUNITY): Payer: Medicare Other

## 2010-11-22 ENCOUNTER — Encounter (HOSPITAL_COMMUNITY): Payer: Self-pay | Admitting: Radiology

## 2010-11-22 LAB — BLOOD GAS, ARTERIAL
Acid-base deficit: 5.3 mmol/L — ABNORMAL HIGH (ref 0.0–2.0)
Bicarbonate: 19.6 mEq/L — ABNORMAL LOW (ref 20.0–24.0)
Patient temperature: 98.6
TCO2: 20.8 mmol/L (ref 0–100)
pCO2 arterial: 38.7 mmHg (ref 35.0–45.0)

## 2010-11-22 LAB — COMPREHENSIVE METABOLIC PANEL
ALT: 21 U/L (ref 0–35)
AST: 36 U/L (ref 0–37)
CO2: 21 mEq/L (ref 19–32)
Calcium: 7.7 mg/dL — ABNORMAL LOW (ref 8.4–10.5)
Creatinine, Ser: 1.41 mg/dL — ABNORMAL HIGH (ref 0.50–1.10)
GFR calc Af Amer: 48 mL/min — ABNORMAL LOW (ref >90)
GFR calc non Af Amer: 42 mL/min — ABNORMAL LOW (ref >90)
Glucose, Bld: 104 mg/dL — ABNORMAL HIGH (ref 70–99)
Total Bilirubin: 0.5 mg/dL (ref 0.3–1.2)
Total Protein: 5.8 g/dL — ABNORMAL LOW (ref 6.0–8.3)

## 2010-11-22 LAB — FERRITIN: Ferritin: 189 ng/mL (ref 10–291)

## 2010-11-22 LAB — IRON AND TIBC
Saturation Ratios: 7 % — ABNORMAL LOW (ref 20–55)
TIBC: 196 ug/dL — ABNORMAL LOW (ref 250–470)

## 2010-11-22 LAB — HIV ANTIBODY (ROUTINE TESTING W REFLEX): HIV: NONREACTIVE

## 2010-11-22 MED ORDER — IOHEXOL 300 MG/ML  SOLN: 80.0000 mL | Freq: Once | INTRAMUSCULAR | Status: AC | PRN

## 2010-11-23 DIAGNOSIS — T400X1A Poisoning by opium, accidental (unintentional), initial encounter: Secondary | ICD-10-CM

## 2010-11-23 DIAGNOSIS — I2699 Other pulmonary embolism without acute cor pulmonale: Secondary | ICD-10-CM

## 2010-11-23 DIAGNOSIS — I959 Hypotension, unspecified: Secondary | ICD-10-CM

## 2010-11-23 DIAGNOSIS — F1999 Other psychoactive substance use, unspecified with unspecified psychoactive substance-induced disorder: Secondary | ICD-10-CM

## 2010-11-23 LAB — BASIC METABOLIC PANEL
BUN: 10 mg/dL (ref 6–23)
Chloride: 110 mEq/L (ref 96–112)
GFR calc Af Amer: 72 mL/min — ABNORMAL LOW (ref >90)
Glucose, Bld: 125 mg/dL — ABNORMAL HIGH (ref 70–99)
Potassium: 4 mEq/L (ref 3.5–5.1)
Sodium: 139 mEq/L (ref 135–145)

## 2010-11-23 LAB — CBC
HCT: 30.9 % — ABNORMAL LOW (ref 36.0–46.0)
Hemoglobin: 10.2 g/dL — ABNORMAL LOW (ref 12.0–15.0)
RDW: 14.1 % (ref 11.5–15.5)
WBC: 5.4 10*3/uL (ref 4.0–10.5)

## 2010-11-24 DIAGNOSIS — I2699 Other pulmonary embolism without acute cor pulmonale: Secondary | ICD-10-CM

## 2010-11-24 DIAGNOSIS — F1999 Other psychoactive substance use, unspecified with unspecified psychoactive substance-induced disorder: Secondary | ICD-10-CM

## 2010-11-24 DIAGNOSIS — M7989 Other specified soft tissue disorders: Secondary | ICD-10-CM

## 2010-11-24 DIAGNOSIS — M79609 Pain in unspecified limb: Secondary | ICD-10-CM

## 2010-11-24 DIAGNOSIS — I959 Hypotension, unspecified: Secondary | ICD-10-CM

## 2010-11-24 DIAGNOSIS — T400X1A Poisoning by opium, accidental (unintentional), initial encounter: Secondary | ICD-10-CM

## 2010-11-24 LAB — CBC
HCT: 30.3 % — ABNORMAL LOW (ref 36.0–46.0)
Hemoglobin: 10.1 g/dL — ABNORMAL LOW (ref 12.0–15.0)
MCHC: 33.3 g/dL (ref 30.0–36.0)
MCV: 95 fL (ref 78.0–100.0)
RBC: 3.19 MIL/uL — ABNORMAL LOW (ref 3.87–5.11)

## 2010-11-24 LAB — GLUCOSE, CAPILLARY: Glucose-Capillary: 129 mg/dL — ABNORMAL HIGH (ref 70–99)

## 2010-11-24 LAB — BASIC METABOLIC PANEL
BUN: 9 mg/dL (ref 6–23)
CO2: 22 mEq/L (ref 19–32)
Chloride: 109 mEq/L (ref 96–112)
Creatinine, Ser: 1.02 mg/dL (ref 0.50–1.10)
Glucose, Bld: 128 mg/dL — ABNORMAL HIGH (ref 70–99)
Potassium: 3.6 mEq/L (ref 3.5–5.1)

## 2010-11-24 LAB — MRSA PCR SCREENING: MRSA by PCR: POSITIVE — AB

## 2010-11-24 LAB — CULTURE, BLOOD (ROUTINE X 2): Culture  Setup Time: 201210261356

## 2010-11-24 MED ORDER — ENTECAVIR 0.5 MG PO TABS
0.5000 mg | ORAL_TABLET | Freq: Every day | ORAL | Status: DC
  Administered 2010-11-27 – 2010-11-30 (×3): 0.5 mg via ORAL
  Filled 2010-11-24 (×7): qty 1

## 2010-11-25 ENCOUNTER — Inpatient Hospital Stay (HOSPITAL_COMMUNITY): Payer: Medicare Other

## 2010-11-25 LAB — PROTIME-INR
INR: 1.15 (ref 0.00–1.49)
Prothrombin Time: 14.9 seconds (ref 11.6–15.2)

## 2010-11-25 LAB — BASIC METABOLIC PANEL
Calcium: 8.8 mg/dL (ref 8.4–10.5)
GFR calc non Af Amer: 61 mL/min — ABNORMAL LOW (ref >90)
Sodium: 140 mEq/L (ref 135–145)

## 2010-11-25 LAB — TYPE AND SCREEN
ABO/RH(D): B NEG
Antibody Screen: NEGATIVE

## 2010-11-25 LAB — CBC
Platelets: 113 10*3/uL — ABNORMAL LOW (ref 150–400)
RDW: 14.2 % (ref 11.5–15.5)
WBC: 4.7 10*3/uL (ref 4.0–10.5)

## 2010-11-26 LAB — PROTIME-INR: Prothrombin Time: 17.3 seconds — ABNORMAL HIGH (ref 11.6–15.2)

## 2010-11-26 LAB — BASIC METABOLIC PANEL
BUN: 15 mg/dL (ref 6–23)
Calcium: 8.5 mg/dL (ref 8.4–10.5)
Chloride: 100 mEq/L (ref 96–112)
Creatinine, Ser: 1.12 mg/dL — ABNORMAL HIGH (ref 0.50–1.10)
GFR calc Af Amer: 64 mL/min — ABNORMAL LOW (ref >90)

## 2010-11-26 LAB — CBC
Hemoglobin: 9.8 g/dL — ABNORMAL LOW (ref 12.0–15.0)
MCH: 30.8 pg (ref 26.0–34.0)
MCHC: 32.9 g/dL (ref 30.0–36.0)
RDW: 14.3 % (ref 11.5–15.5)

## 2010-11-26 MED ORDER — ACETAMINOPHEN 325 MG PO TABS: 650.0000 mg | ORAL_TABLET | Freq: Four times a day (QID) | ORAL | Status: DC | PRN

## 2010-11-26 MED ORDER — SODIUM CHLORIDE 0.9 % IJ SOLN
3.0000 mL | Freq: Two times a day (BID) | INTRAMUSCULAR | Status: DC
  Administered 2010-11-27 – 2010-11-30 (×5): 3 mL via INTRAVENOUS

## 2010-11-26 MED ORDER — MUPIROCIN 2 % EX OINT
TOPICAL_OINTMENT | Freq: Two times a day (BID) | CUTANEOUS | Status: AC
  Administered 2010-11-27 (×2): via NASAL
  Administered 2010-11-28: 1 via NASAL
  Administered 2010-11-29: 11:00:00 via NASAL
  Filled 2010-11-26 (×2): qty 22

## 2010-11-26 MED ORDER — TENOFOVIR DISOPROXIL FUMARATE 300 MG PO TABS
300.0000 mg | ORAL_TABLET | Freq: Every day | ORAL | Status: DC
  Administered 2010-11-27 – 2010-11-30 (×4): 300 mg via ORAL
  Filled 2010-11-26 (×5): qty 1

## 2010-11-26 MED ORDER — PREDNISONE 10 MG PO TABS: 10.0000 mg | ORAL_TABLET | Freq: Every day | ORAL | Status: DC

## 2010-11-26 MED ORDER — NICOTINE 21 MG/24HR TD PT24
21.0000 mg | MEDICATED_PATCH | Freq: Every day | TRANSDERMAL | Status: DC
  Administered 2010-11-27 – 2010-11-30 (×4): 21 mg via TRANSDERMAL
  Filled 2010-11-26 (×5): qty 1

## 2010-11-26 MED ORDER — ALBUTEROL SULFATE (5 MG/ML) 0.5% IN NEBU
2.5000 mg | INHALATION_SOLUTION | Freq: Four times a day (QID) | RESPIRATORY_TRACT | Status: DC | PRN
  Administered 2010-11-27 – 2010-11-28 (×2): 2.5 mg via RESPIRATORY_TRACT
  Filled 2010-11-26 (×2): qty 0.5

## 2010-11-26 MED ORDER — ALPRAZOLAM 0.5 MG PO TABS
1.0000 mg | ORAL_TABLET | Freq: Three times a day (TID) | ORAL | Status: DC
  Administered 2010-11-27 – 2010-11-30 (×10): 1 mg via ORAL
  Filled 2010-11-26 (×7): qty 1
  Filled 2010-11-26: qty 2
  Filled 2010-11-26: qty 1
  Filled 2010-11-26: qty 2
  Filled 2010-11-26: qty 1
  Filled 2010-11-26: qty 2
  Filled 2010-11-26: qty 1

## 2010-11-26 MED ORDER — TRAMADOL HCL 50 MG PO TABS
50.0000 mg | ORAL_TABLET | ORAL | Status: DC | PRN
  Administered 2010-11-27 – 2010-11-29 (×8): 50 mg via ORAL
  Filled 2010-11-26 (×5): qty 1

## 2010-11-26 MED ORDER — AMLODIPINE BESYLATE 5 MG PO TABS
5.0000 mg | ORAL_TABLET | Freq: Every day | ORAL | Status: DC
  Administered 2010-11-27 – 2010-11-30 (×3): 5 mg via ORAL
  Filled 2010-11-26 (×5): qty 1

## 2010-11-26 MED ORDER — ENOXAPARIN SODIUM 120 MG/0.8ML ~~LOC~~ SOLN
120.0000 mg | Freq: Two times a day (BID) | SUBCUTANEOUS | Status: DC
  Administered 2010-11-27 – 2010-11-30 (×7): 120 mg via SUBCUTANEOUS
  Filled 2010-11-26 (×10): qty 0.8

## 2010-11-26 MED ORDER — PREDNISONE 20 MG PO TABS
20.0000 mg | ORAL_TABLET | Freq: Every day | ORAL | Status: AC
  Administered 2010-11-27 – 2010-11-28 (×2): 20 mg via ORAL
  Filled 2010-11-26 (×2): qty 1

## 2010-11-26 MED ORDER — MORPHINE SULFATE 2 MG/ML IJ SOLN
2.0000 mg | INTRAMUSCULAR | Status: DC | PRN
  Administered 2010-11-27 – 2010-11-28 (×7): 2 mg via INTRAVENOUS
  Filled 2010-11-26 (×4): qty 1

## 2010-11-26 MED ORDER — OXYCODONE HCL 5 MG PO TABS: 5.0000 mg | ORAL_TABLET | ORAL | Status: DC | PRN

## 2010-11-26 MED ORDER — ESCITALOPRAM OXALATE 20 MG PO TABS
20.0000 mg | ORAL_TABLET | Freq: Every day | ORAL | Status: DC
  Administered 2010-11-27 – 2010-11-30 (×4): 20 mg via ORAL
  Filled 2010-11-26 (×5): qty 1

## 2010-11-26 MED ORDER — PANTOPRAZOLE SODIUM 40 MG PO TBEC
40.0000 mg | DELAYED_RELEASE_TABLET | Freq: Two times a day (BID) | ORAL | Status: DC
  Administered 2010-11-27 – 2010-11-29 (×6): 40 mg via ORAL
  Filled 2010-11-26 (×6): qty 1

## 2010-11-26 MED ORDER — DOXEPIN HCL 75 MG PO CAPS
150.0000 mg | ORAL_CAPSULE | Freq: Every day | ORAL | Status: DC
  Administered 2010-11-27 – 2010-11-29 (×3): 150 mg via ORAL
  Filled 2010-11-26 (×6): qty 2

## 2010-11-26 MED ORDER — ALPRAZOLAM 0.5 MG PO TABS: 0.5000 mg | ORAL_TABLET | Freq: Three times a day (TID) | ORAL | Status: DC

## 2010-11-26 MED ORDER — ILOPERIDONE 4 MG PO TABS
12.0000 mg | ORAL_TABLET | Freq: Every day | ORAL | Status: DC
  Administered 2010-11-27 – 2010-11-30 (×4): 12 mg via ORAL
  Filled 2010-11-26 (×5): qty 3

## 2010-11-26 MED ORDER — TIOTROPIUM BROMIDE MONOHYDRATE 18 MCG IN CAPS
18.0000 ug | ORAL_CAPSULE | Freq: Every day | RESPIRATORY_TRACT | Status: DC
  Administered 2010-11-27 – 2010-11-28 (×2): 18 ug via RESPIRATORY_TRACT
  Filled 2010-11-26 (×2): qty 5

## 2010-11-26 MED ORDER — BUDESONIDE-FORMOTEROL FUMARATE 160-4.5 MCG/ACT IN AERO
2.0000 | INHALATION_SPRAY | Freq: Two times a day (BID) | RESPIRATORY_TRACT | Status: DC
  Administered 2010-11-27 – 2010-11-29 (×5): 2 via RESPIRATORY_TRACT
  Filled 2010-11-26: qty 6

## 2010-11-26 MED ORDER — DOCUSATE SODIUM 100 MG PO CAPS
100.0000 mg | ORAL_CAPSULE | Freq: Two times a day (BID) | ORAL | Status: DC
  Administered 2010-11-27 – 2010-11-30 (×7): 100 mg via ORAL
  Filled 2010-11-26 (×10): qty 1

## 2010-11-26 MED ORDER — DOXYCYCLINE HYCLATE 100 MG PO TABS
100.0000 mg | ORAL_TABLET | Freq: Two times a day (BID) | ORAL | Status: DC
  Administered 2010-11-27 (×2): 100 mg via ORAL
  Filled 2010-11-26 (×6): qty 1

## 2010-11-26 MED ORDER — CHLORHEXIDINE GLUCONATE CLOTH 2 % EX PADS
6.0000 | MEDICATED_PAD | Freq: Every day | CUTANEOUS | Status: AC
  Administered 2010-11-27 – 2010-11-28 (×2): 6 via TOPICAL

## 2010-11-27 DIAGNOSIS — J441 Chronic obstructive pulmonary disease with (acute) exacerbation: Secondary | ICD-10-CM | POA: Diagnosis present

## 2010-11-27 DIAGNOSIS — S82892A Other fracture of left lower leg, initial encounter for closed fracture: Secondary | ICD-10-CM | POA: Diagnosis present

## 2010-11-27 DIAGNOSIS — T40601A Poisoning by unspecified narcotics, accidental (unintentional), initial encounter: Secondary | ICD-10-CM | POA: Diagnosis present

## 2010-11-27 DIAGNOSIS — F1410 Cocaine abuse, uncomplicated: Secondary | ICD-10-CM | POA: Diagnosis present

## 2010-11-27 DIAGNOSIS — I2699 Other pulmonary embolism without acute cor pulmonale: Secondary | ICD-10-CM | POA: Diagnosis present

## 2010-11-27 DIAGNOSIS — N179 Acute kidney failure, unspecified: Secondary | ICD-10-CM | POA: Diagnosis present

## 2010-11-27 HISTORY — DX: Other fracture of left lower leg, initial encounter for closed fracture: S82.892A

## 2010-11-27 LAB — CBC
Hemoglobin: 10.2 g/dL — ABNORMAL LOW (ref 12.0–15.0)
MCH: 31.7 pg (ref 26.0–34.0)
MCHC: 33.8 g/dL (ref 30.0–36.0)
RDW: 14.2 % (ref 11.5–15.5)

## 2010-11-27 LAB — BASIC METABOLIC PANEL
CO2: 27 mEq/L (ref 19–32)
Calcium: 8.8 mg/dL (ref 8.4–10.5)
Creatinine, Ser: 0.97 mg/dL (ref 0.50–1.10)
GFR calc non Af Amer: 66 mL/min — ABNORMAL LOW (ref >90)
Glucose, Bld: 120 mg/dL — ABNORMAL HIGH (ref 70–99)

## 2010-11-27 LAB — PROTIME-INR: Prothrombin Time: 22.9 seconds — ABNORMAL HIGH (ref 11.6–15.2)

## 2010-11-27 MED ORDER — WARFARIN SODIUM 4 MG PO TABS
4.0000 mg | ORAL_TABLET | Freq: Once | ORAL | Status: AC
  Administered 2010-11-27: 4 mg via ORAL
  Filled 2010-11-27: qty 1

## 2010-11-27 NOTE — Progress Notes (Signed)
  Subjective:  Post ORIF complex left ankle fracture.  Comfortable. Foley cath. In  PE:  Left foot well perfused. Dressing clean and intact.  Assess: Post ORIF complex ankle fracture/dislocation  Plan: 1) can D/C foley 2) non weight-bearing left lower extremity; only needs boot when up with therapy 3) DVT prophylaxis per primary team

## 2010-11-27 NOTE — Progress Notes (Signed)
ANTICOAGULATION CONSULT NOTE - Follow Up Consult  Pharmacy Consult for Lovenox bridge to Coumadin (Initial Consult 11/24/10) Indication: pulmonary embolus  Allergies  Allergen Reactions  . Duloxetine     REACTION: HA  . Gabapentin     REACTION: rash in mouth  . Triple Antibiotic     REACTION: rash/hives    Patient Measurements: Height: 5\' 5"  (165.1 cm) (Entered for Cutover) Weight: 269 lb 6.4 oz (122.2 kg) (Entered for Cutover) IBW/kg (Calculated) : 57  Adjusted Body Weight: 76.6kg  Vital Signs: Temp: 97.7 F (36.5 C) (11/04 0500) BP: 134/85 mmHg (11/04 0500) Pulse Rate: 79  (11/04 0500)  Labs:  Basename 11/27/10 0545 11/27/10 0300 11/26/10 0500 11/25/10 0614  HGB 10.2* -- 9.8* --  HCT 30.2* -- 29.8* 29.2*  PLT 156 -- 127* 113*  APTT -- -- -- --  LABPROT -- 22.9* 17.3* 14.9  INR -- 1.99* 1.39 1.15  HEPARINUNFRC -- -- -- --  CREATININE -- 0.97 1.12* 1.03  CKTOTAL -- -- -- --  CKMB -- -- -- --  TROPONINI -- -- -- --   Estimated Creatinine Clearance: 88 ml/min (by C-G formula based on Cr of 0.97).   Assessment: 54yoF on Lovenox bridge to Coumadin for Pulmonary Embolism. INR sub therapeutic but trending up after 3 doses. Will continue lovenox bridge for minimum of 5 days overlap and until INR therapeutic.  Goal of Therapy:  INR 2-3   Plan:  Continue Lovenox 120 mg Q12 hours. Monitor CBC.  Coumadin 4mg  PO x 1 at 1800. Follow-up INR in AM.  Holston Oyama N 11/27/2010,8:38 AM

## 2010-11-27 NOTE — Op Note (Signed)
NAMEMELORA, Fields NO.:  [id number]  MEDICAL RECORD NO.:  [id number]  LOCATION:  3316                         FACILITY:  [facility]  PHYSICIAN:  Vanita Panda. Magnus Ivan, M.D.DATE OF BIRTH:  05-02-1955  DATE OF PROCEDURE:  11/25/2010 DATE OF DISCHARGE:                              OPERATIVE REPORT   PREOPERATIVE DIAGNOSIS:  Left unstable trimalleolar ankle fracture, dislocation.  POSTPROCEDURE DIAGNOSIS:  Left unstable trimalleolar ankle fracture, dislocation.  PROCEDURE:  Open reduction and internal fixation of left ankle lateral malleolus and medial malleolus only.  SURGEON:  Vanita Panda. Magnus Ivan, MD  ANESTHESIA: 1. General. 2. Local with 0.25% plain Sensorcaine.  ANTIBIOTICS:  2 g IV Ancef.  BLOOD LOSS:  100-150 mL.  COMPLICATIONS:  None.  INDICATIONS:  [name] is a 55 year old female who a week ago today, sustained a mechanical fall down some stairs after she had actually stumbled.  She was using cocaine at that time as well.  She was seen at the [facility] Room by the ER staff and a closed reduction of her ankle fracture was immediately performed and placed in a splint.  I was called and recommended elevation, nonweightbearing with followup in the office in a few days, so I set her up for surgery today.  She then returned to the emergency room on Monday, was taking too much of her narcotics, Percocet, wanting more and being so unstable in terms of balance and coordination.  It was felt that she needed to be admitted for social reasons.  She was graciously admitted to the [facility], but they did found that she had a pulmonary embolus as well. She is now cleared for surgery today.  My reason for delaying surgery was due to the fact that she has severe swelling in that ankle and the concern about the soft tissue.  I explained this to her in length and we coordinated surgery with the [facility] Medicine Service as  well.  PROCEDURE DESCRIPTION:  After informed consent was obtained and appropriate left ankle was marked, she was brought to the operating room and placed on the operating table.  General anesthesia was then obtained.  I then took the splint off for ankle and found that she had significant scarring in the medial tissues, which I was quite concerned about due to the pressure she has had some slight subluxation of her ankle.  Obviously, she has been trying to walk on this in spite medical advice as well, and to be nonweightbearing.  She also let me know before she had tried to walk against medical advice.  I then placed a nonsterile tourniquet around her upper left thigh and her left leg was prepped and draped with DuraPrep and sterile drapes.  A time-out was called.  she was identified as correct patient and correct left ankle. To stabilize the lateral side, I first made incision directly lateral and dissected down to the fibula.  The fibula had an oblique fracture down at the level of the ankle mortise.  I cleaned the fracture debris and hematoma, and then was able to use reduction forceps to reduce the fracture anatomically and I verified this  under direct fluoroscopic guidance.  I then placed a lag screw from anterior to posterior, and then a [employer] 5-hole locking plate, secured with proximal and distal screws.  This did reduce the ankle mortise as well.  My concern was making any incision large over this eschar, so I made a small incision just distal to this on the medial aspect, and was able to use a dental pick to temporarily hold the fracture line which was just to the tip of the medial malleolus.  I placed a single K-wire and then a single cannulated 40-mm screw, which is a partially threaded on the medial side to at least stabilize the fracture further.  The posterior malleolus piece was not addressed due to the fact that it was quite small.  I then stressed the ankle  under fluoroscopy to make sure that it was ligamentously stable and it was then copiously irrigated both wounds and closed the deep tissue with 0 Vicryl followed by 2-0 Vicryl, and 2-0 nylon on the skin on both wounds.  There were infiltrated with 0.25% plain Sensorcaine.  Xeroform followed by well-padded sterile dressing was then applied, and the tourniquet was let down.  The patient was awakened, extubated, and taken recovery room in stable condition.  All final counts were correct, and there were no complications noted.     Vanita Panda. Magnus Ivan, M.D.     CYB/MEDQ  D:  11/25/2010  T:  11/26/2010  Job:  [id number]  Electronically Signed by Doneen Poisson M.D. on 11/27/2010 09:23:36 AM

## 2010-11-27 NOTE — Progress Notes (Signed)
Subjective: Patient doing well today following surgery.  She has been using IV morphine for her pain medication.  She states her appetite is down some but she is able to eat and drink.  Passing gas after surgery.  Denies SOB, Nausea, vomiting.   Objective: Vital signs in last 24 hours: Filed Vitals:   11/26/10 2200 11/27/10 0500 11/27/10 0900 11/27/10 1505  BP: 124/82 134/85    Pulse: 94 79  91  Temp: 98.2 F (36.8 C) 97.7 F (36.5 C)    Resp: 18 18    Height:      Weight:      SpO2:  94% 94% 91%   Weight change:   Intake/Output Summary (Last 24 hours) at 11/27/10 1727 Last data filed at 11/27/10 1300  Gross per 24 hour  Intake   1930 ml  Output   4950 ml  Net  -3020 ml   Vitals reviewed General: resting in bed, NAD, eating dinner HEENT: PERRL, EOMI, no scleral icterus Cardiac: RRR, no rubs, murmurs or gallops Pulm: distant breath sounds, CTAB. moving normal volumes of air Abd: soft, nontender, nondistended, BS present Ext: left foot and ankle wrapped.  Able to wiggle toes, warm and well perfused, no pedal edema Neuro: alert and oriented X3, cranial nerves II-XII grossly intact, strength and sensation to light touch equal in bilateral upper and lower extremities  Lab Results: Basic Metabolic Panel:  Lab 11/27/10 1610 11/26/10 0500  NA 135 136  K 3.8 3.3*  CL 99 100  CO2 27 27  GLUCOSE 120* 93  BUN 15 15  CREATININE 0.97 1.12*  CALCIUM 8.8 8.5  MG -- --  PHOS -- --   Liver Function Tests:  Lab 11/22/10 0216 11/21/10 1653  AST 36 42*  ALT 21 22  ALKPHOS 86 91  BILITOT 0.5 0.6  PROT 5.8* 6.5  ALBUMIN 2.5* 2.8*    Lab 11/21/10 1653  AMMONIA 22   CBC:  Lab 11/27/10 0545 11/26/10 0500 11/21/10 1653  WBC 6.3 6.2 --  NEUTROABS -- -- 7.5  HGB 10.2* 9.8* --  HCT 30.2* 29.8* --  MCV 93.8 93.7 --  PLT 156 127* --   Cardiac Enzymes:  Lab 11/21/10 2125  CKTOTAL 1320*  CKMB 16.0*  CKMBINDEX --  TROPONINI <0.30   D-Dimer:  Lab 11/22/10 0116  DDIMER  3.99*   CBG:  Lab 11/24/10 2157  GLUCAP 129*   Coagulation:  Lab 11/27/10 0300 11/26/10 0500 11/25/10 0614  INR 1.99* 1.39 1.15   Anemia Panel:  Lab 11/22/10 0216  VITAMINB12 1065*  FOLATE 6.9  FERRITIN 189  TIBC 196*  IRON 14*  RETICCTPCT --   Alcohol Level:  Lab 11/21/10 1653  ETH <11   Micro Results: Recent Results (from the past 240 hour(s))  URINE CULTURE     Status: Normal   Collection Time   11/18/10  9:31 AM      Component Value Range Status Comment   Specimen Description URINE, CATHETERIZED   Final    Special Requests NONE   Final    Setup Time [id number]   Final    Colony Count NO GROWTH   Final    Culture NO GROWTH   Final    Report Status 11/19/2010 FINAL   Final   CULTURE, BLOOD (ROUTINE X 2)     Status: Normal   Collection Time   11/18/10  9:42 AM      Component Value Range Status Comment   Specimen Description  BLOOD LEFT ARM   Final    Special Requests BOTTLES DRAWN AEROBIC ONLY 10CC   Final    Setup Time [id number]   Final    Culture NO GROWTH 5 DAYS   Final    Report Status 11/24/2010 FINAL   Final   CULTURE, BLOOD (ROUTINE X 2)     Status: Normal   Collection Time   11/18/10 10:05 AM      Component Value Range Status Comment   Specimen Description BLOOD RIGHT ARM   Final    Special Requests BOTTLES DRAWN AEROBIC AND ANAEROBIC 10CC   Final    Setup Time [id number]   Final    Culture     Final    Value: STAPHYLOCOCCUS SPECIES (COAGULASE NEGATIVE)     Note: THE SIGNIFICANCE OF ISOLATING THIS ORGANISM FROM A SINGLE SET OF BLOOD CULTURES WHEN MULTIPLE SETS ARE DRAWN IS UNCERTAIN. PLEASE NOTIFY THE MICROBIOLOGY DEPARTMENT WITHIN ONE WEEK IF SPECIATION AND SENSITIVITIES ARE REQUIRED.     Note: Gram Stain Report Called to,Read Back By and Verified With: RN Helene Kelp ON 11/19/10 AT 1050 BY TEDAR   Report Status 11/20/2010 FINAL   Final   MRSA PCR SCREENING     Status: Abnormal   Collection Time   11/24/10 10:28 AM      Component Value Range  Status Comment   MRSA by PCR POSITIVE (*) NEGATIVE  Final    Studies/Results: Dg Ankle Complete Left   11/25/2010  *RADIOLOGY REPORT*  Clinical Data: Left ankle operative fixation for trimalleolar fractures.  LEFT ANKLE COMPLETE - 3+ VIEW  Comparison: 11/18/2010.  Findings: Three C-arm views of the left ankle demonstrate screw fixation of the previously demonstrated medial malleolus fracture with anatomic position and alignment.  There is also a screw and plate fixation of the previously seen lateral malleolus fracture with anatomic position and alignment.  The previously seen posterior malleolus fracture is not well visualized due to overlapping of the hardware.  IMPRESSION: Hardware fixation of the previously seen medial and lateral malleolus fractures with anatomic position and alignment.  Original Report Authenticated By: Darrol Angel, M.D.   Dg C-arm 1-60 Min  11/25/2010  CLINICAL DATA: orif left ankle   C-ARM 1-60 MINUTES  Fluoroscopy was utilized by the requesting physician.  No radiographic  interpretation.     Medications: I have reviewed the patient's current medications. Scheduled Meds:   . ALPRAZolam  1 mg Oral TID  . amLODipine  5 mg Oral Daily  . budesonide-formoterol  2 puff Inhalation BID  . Chlorhexidine Gluconate Cloth  6 each Topical Q0600  . docusate sodium  100 mg Oral BID  . doxepin  150 mg Oral QHS  . doxycycline  100 mg Oral Q12H  . enoxaparin (LOVENOX) injection  120 mg Subcutaneous Q12H  . entecavir  0.5 mg Oral Daily  . escitalopram  20 mg Oral Daily  . iloperidone  12 mg Oral Daily  . mupirocin   Nasal BID  . nicotine  21 mg Transdermal Daily  . pantoprazole  40 mg Oral Q12H  . predniSONE  10 mg Oral Daily  . predniSONE  20 mg Oral Daily  . sodium chloride  3 mL Intravenous Q12H  . tenofovir  300 mg Oral Daily  . tiotropium  18 mcg Inhalation Daily  . warfarin  4 mg Oral ONCE-1800  . DISCONTD: ALPRAZolam  0.5 mg Oral TID   Continuous Infusions:  PRN  Meds:.acetaminophen, albuterol, morphine injection, traMADol,  DISCONTD: oxyCODONE Assessment/Plan:  1. Ankle fracture, left: POD #2 from ORIF.  Pain has been controlled with IV morphine.  We will see how much she uses overnight to design an outpatient oral regimen.  She is non-weight bearing for 6 weeks.  PT has suggested PT at SNF for rehab.    2. Pulmonary embolism:  She is back on Lovenox and Coumadin.  INR today is 1.99.  Once she is therapeutic for 2 days we will stop her Lovenox.    3. Opiate overdose:  She has recovered from this.  She will need pain medication because of the fracture but will definitely need an end point for therapy.   4.  Acute exacerbation of chronic obstructive pulmonary disease (COPD):  Breathing much better today.  O2 sats are good.  Plan to continue her medications as an outpatient.  Tapering down her prednisone to 20 mg daily today.  Day 7 of ABX today.  Plan to stop tomorrow.  5.  ANEMIA, IRON DEFICIENCY: HgB stable.    6. HEPATITIS B:  Maintained on her current outpatient medications.  7.  HYPERTENSION:  BP stable on her home medications.  8.  ANXIETY:  Stable currently on her home medications.  9.  Acute on chronic renal failure: resolved with fluids.  Currently at baseline.   LOS: 6 days   Kenzy Campoverde 11/27/2010, 5:27 PM

## 2010-11-27 NOTE — Progress Notes (Signed)
Physical Therapy Treatment Patient Details Name: [name] MRN: [id number] DOB: [birth year] Today's Date: 11/27/2010  PT Assessment/Plan  PT - Assessment/Plan Comments on Treatment Session: Pt. was slightly self limiting today.  Needed max encouragement to participate with PT and only able to ambulate a few feet and then she was fatigued and could not maintain her weight bearing status. PT Plan: Discharge plan remains appropriate (May need to update frequency if continues with slow progress) PT Frequency: Min 5X/week Follow Up Recommendations: Skilled nursing facility Equipment Recommended: Defer to next venue PT Goals  Acute Rehab PT Goals PT Goal Formulation: With patient Time For Goal Achievement: 7 days Pt will Roll Supine to Right Side: with modified independence PT Goal: Rolling Supine to Right Side - Progress: Other (comment) Pt will go Supine/Side to Sit: Independently PT Goal: Supine/Side to Sit - Progress: Other (comment) Pt will Transfer Sit to Stand/Stand to Sit: with modified independence PT Transfer Goal: Sit to Stand/Stand to Sit - Progress: Progressing toward goal Pt will Transfer Bed to Chair/Chair to Bed: with modified independence PT Transfer Goal: Bed to Chair/Chair to Bed - Progress: Progressing toward goal Pt will Ambulate: 1 - 15 feet;with rolling walker;with min assist PT Goal: Ambulate - Progress: Progressing toward goal  PT Treatment Precautions/Restrictions  Precautions Precautions: Fall Required Braces or Orthoses: Yes Other Brace/Splint: Cam walker when OOB Restrictions Weight Bearing Restrictions: Yes RUE Weight Bearing: Non weight bearing LLE Weight Bearing: Non weight bearing Mobility (including Balance) Bed Mobility Bed Mobility: No Transfers Transfers: Yes Sit to Stand: 1: +2 Total assist;Patient percentage (comment);With upper extremity assist;From chair/3-in-1 Sit to Stand Details (indicate cue type and reason): Pt. = 50% as pt. had  difficulty getting up from low recliner.  Pt. needed verbal and tactile cues for technique and hand placement as well as to maintain NWB L LE.   Stand to Sit: 3: Mod assist;With upper extremity assist;To chair/3-in-1;With armrests Stand to Sit Details: "plopped" into recliner chair Ambulation/Gait Ambulation/Gait: Yes Ambulation/Gait Assistance: 3: Mod assist Ambulation/Gait Assistance Details (indicate cue type and reason): Pt. took about 5 steps and then was unable to maintain NWB L LE.  Needed max cues throughout to maintain NWB for posture and to hold LE off floor.  Pt. would rest foot on floor between hops. Ambulation Distance (Feet): 5 Feet Assistive device: Rolling walker Gait Pattern: Step-to pattern;Lateral trunk lean to right Stairs: No Wheelchair Mobility Wheelchair Mobility: No  Posture/Postural Control Posture/Postural Control: Postural limitations Postural Limitations: Leans forward too far. Balance Balance Assessed: No Exercise    End of Session PT - End of Session Equipment Utilized During Treatment: Gait belt;Left ankle foot orthosis Activity Tolerance: Patient tolerated treatment well;Patient limited by fatigue;Patient limited by pain Patient left: in chair;with call bell in reach General Behavior During Session: [facility] for tasks performed Cognition: [facility] for tasks performed  INGOLD,Haelee Bolen 11/27/2010, 4:16 PM

## 2010-11-28 DIAGNOSIS — I959 Hypotension, unspecified: Secondary | ICD-10-CM

## 2010-11-28 DIAGNOSIS — F1999 Other psychoactive substance use, unspecified with unspecified psychoactive substance-induced disorder: Secondary | ICD-10-CM

## 2010-11-28 DIAGNOSIS — I2699 Other pulmonary embolism without acute cor pulmonale: Secondary | ICD-10-CM

## 2010-11-28 DIAGNOSIS — T400X1A Poisoning by opium, accidental (unintentional), initial encounter: Secondary | ICD-10-CM

## 2010-11-28 LAB — PROTIME-INR: Prothrombin Time: 24 seconds — ABNORMAL HIGH (ref 11.6–15.2)

## 2010-11-28 MED ORDER — OXYCODONE HCL 5 MG PO TABS
5.0000 mg | ORAL_TABLET | ORAL | Status: DC | PRN
Start: 2010-11-28 — End: 2010-11-30
  Administered 2010-11-28 – 2010-11-30 (×7): 5 mg via ORAL
  Filled 2010-11-28 (×5): qty 1

## 2010-11-28 MED ORDER — WARFARIN SODIUM 4 MG PO TABS
4.0000 mg | ORAL_TABLET | Freq: Once | ORAL | Status: AC
  Administered 2010-11-28: 4 mg via ORAL
  Filled 2010-11-28: qty 1

## 2010-11-28 NOTE — Progress Notes (Signed)
Patient now nontele.  Has bed on 5000, report called to Nurse Carollee Herter.  [name]

## 2010-11-28 NOTE — Progress Notes (Signed)
Patient's BP 100/68.  Morning dose of Norvasc held.  Patient sitting up in chair.  Dr. Berlinda Last notified.  Will continue to monitor.  Colman Cater

## 2010-11-28 NOTE — Progress Notes (Signed)
Please see hard/shadow chart for initial assessment. CSW provided bed offers to pt and will facilitate d/c to SNF as soon as pt medically stable for d/c. [address] [place], [phone]

## 2010-11-28 NOTE — Progress Notes (Signed)
Internal Medicine Attending  Date: 11/28/2010  Patient name: [name] Medical record number: [id number] Date of birth: Oct 19, 1955 Age: 55 y.o. Gender: female  I saw and evaluated the patient. I reviewed the resident's note by Dr. Abner Greenspan and I agree with the resident's findings and plan as documented in his note.

## 2010-11-28 NOTE — Progress Notes (Signed)
Subjective: Feeling well after surg.  Pain controlled on IV morphine.  Breathing well, does not think she needs o2 currently.  Objective: Vital signs in last 24 hours: Filed Vitals:   11/27/10 2200 11/28/10 0615 11/28/10 0904 11/28/10 0929  BP: 129/75 144/91    Pulse: 86 80    Temp: 98.1 F (36.7 C) 97.4 F (36.3 C)    TempSrc: Oral Oral    Resp: 20 20    Height:      Weight:  255 lb 1.2 oz (115.7 kg)    SpO2: 94% 91% 92% 90%   Weight change:   Intake/Output Summary (Last 24 hours) at 11/28/10 0951 Last data filed at 11/28/10 0900  Gross per 24 hour  Intake    960 ml  Output   2225 ml  Net  -1265 ml   Physical Exam GEN: NAD.  Alert and oriented x 3.  Pleasant, conversant, and cooperative to exam. RESP:  Poor air movement in LL b/l, no w/r/r CARDIOVASCULAR: RRR, S1, S2, no m/r/g ABDOMEN: soft, NT/ND, NABS EXT: bandaged LLE  Lab Results: Basic Metabolic Panel:  Lab 11/27/10 1610 11/26/10 0500  NA 135 136  K 3.8 3.3*  CL 99 100  CO2 27 27  GLUCOSE 120* 93  BUN 15 15  CREATININE 0.97 1.12*  CALCIUM 8.8 8.5  MG -- --  PHOS -- --   Liver Function Tests:  Lab 11/22/10 0216 11/21/10 1653  AST 36 42*  ALT 21 22  ALKPHOS 86 91  BILITOT 0.5 0.6  PROT 5.8* 6.5  ALBUMIN 2.5* 2.8*   No results found for this basename: LIPASE:2,AMYLASE:2 in the last 168 hours  Lab 11/21/10 1653  AMMONIA 22   CBC:  Lab 11/27/10 0545 11/26/10 0500 11/21/10 1653  WBC 6.3 6.2 --  NEUTROABS -- -- 7.5  HGB 10.2* 9.8* --  HCT 30.2* 29.8* --  MCV 93.8 93.7 --  PLT 156 127* --   Cardiac Enzymes:  Lab 11/21/10 2125  CKTOTAL 1320*  CKMB 16.0*  CKMBINDEX --  TROPONINI <0.30   BNP: No results found for this basename: POCBNP:3 in the last 168 hours D-Dimer:  Lab 11/22/10 0116  DDIMER 3.99*   CBG:  Lab 11/24/10 2157  GLUCAP 129*    Lab 11/28/10 0500 11/27/10 0300 11/26/10 0500 11/25/10 0614  INR 2.11* 1.99* 1.39 1.15   Anemia Panel:  Lab 11/22/10 0216  VITAMINB12  1065*  FOLATE 6.9  FERRITIN 189  TIBC 196*  IRON 14*  RETICCTPCT --   Alcohol Level:  Lab 11/21/10 1653  ETH <11    Micro Results: Recent Results (from the past 240 hour(s))  CULTURE, BLOOD (ROUTINE X 2)     Status: Normal   Collection Time   11/18/10 10:05 AM      Component Value Range Status Comment   Specimen Description BLOOD RIGHT ARM   Final    Special Requests BOTTLES DRAWN AEROBIC AND ANAEROBIC 10CC   Final    Setup Time [id number]   Final    Culture     Final    Value: STAPHYLOCOCCUS SPECIES (COAGULASE NEGATIVE)     Note: THE SIGNIFICANCE OF ISOLATING THIS ORGANISM FROM A SINGLE SET OF BLOOD CULTURES WHEN MULTIPLE SETS ARE DRAWN IS UNCERTAIN. PLEASE NOTIFY THE MICROBIOLOGY DEPARTMENT WITHIN ONE WEEK IF SPECIATION AND SENSITIVITIES ARE REQUIRED.     Note: Gram Stain Report Called to,Read Back By and Verified With: RN CLisbeth Ply ON 11/19/10 AT 1050 BY TEDAR  Report Status 11/20/2010 FINAL   Final   MRSA PCR SCREENING     Status: Abnormal   Collection Time   11/24/10 10:28 AM      Component Value Range Status Comment   MRSA by PCR POSITIVE (*) NEGATIVE  Final    Studies/Results: No results found. Medications: Scheduled Meds:   . ALPRAZolam  1 mg Oral TID  . amLODipine  5 mg Oral Daily  . budesonide-formoterol  2 puff Inhalation BID  . Chlorhexidine Gluconate Cloth  6 each Topical Q0600  . docusate sodium  100 mg Oral BID  . doxepin  150 mg Oral QHS  . doxycycline  100 mg Oral Q12H  . enoxaparin (LOVENOX) injection  120 mg Subcutaneous Q12H  . entecavir  0.5 mg Oral Daily  . escitalopram  20 mg Oral Daily  . iloperidone  12 mg Oral Daily  . mupirocin   Nasal BID  . nicotine  21 mg Transdermal Daily  . pantoprazole  40 mg Oral Q12H  . predniSONE  20 mg Oral Daily  . sodium chloride  3 mL Intravenous Q12H  . tenofovir  300 mg Oral Daily  . tiotropium  18 mcg Inhalation Daily  . warfarin  4 mg Oral ONCE-1800  . warfarin  4 mg Oral ONCE-1800  . DISCONTD:  predniSONE  10 mg Oral Daily   Continuous Infusions:  PRN Meds:.acetaminophen, albuterol, morphine injection, traMADol  Assessment/Plan: # Ankle fracture, left: POD #3 from ORIF. Pain has been controlled with IV morphine.  Will transition to PO oxycodone and see how she does.  She is non-weight bearing for 6 weeks. PT has suggested PT at SNF for rehab. - appreciate ortho rec's - transition to oxycodone - SW to discuss SNF options for PT  # Pulmonary embolism: She is back on Lovenox and Coumadin. INR therapeutic today. Can stop lovenox tomorrow if therapeutic. - continue coumadin - con't lovenox  # Opiate overdose: She has recovered from this. She will need pain medication because of the fracture but will definitely need an end point for therapy.  # Acute exacerbation of chronic obstructive pulmonary disease (COPD): Breathing well today but sats dropped to 88% overnight.  Back on 2L, will con't to wean.  Baseline o2 likely low.  No wheezes on exam. - con't prednisone taper - con't IS - d/c abx today   # ANEMIA, IRON DEFICIENCY: HgB stable.   # HEPATITIS B: Maintained on her current outpatient medications.   # HYPERTENSION: BP stable on her home medications.   # ANXIETY: Stable currently on her home medications.   # Acute on chronic renal failure: resolved with fluids. Currently at baseline.    LOS: 7 days   [facility], BEN 11/28/2010, 9:51 AM

## 2010-11-28 NOTE — Progress Notes (Signed)
ANTICOAGULATION CONSULT NOTE - Follow Up Consult  Pharmacy Consult for Coumadin/Lovenox Indication: pulmonary embolus  Allergies  Allergen Reactions  . Duloxetine     REACTION: HA  . Gabapentin     REACTION: rash in mouth  . Triple Antibiotic     REACTION: rash/hives    Patient Measurements: Height: 5\' 5"  (165.1 cm) (Entered for Cutover) Weight: 255 lb 1.2 oz (115.7 kg) IBW/kg (Calculated) : 57    Vital Signs: Temp: 97.4 F (36.3 C) (11/05 0615) Temp src: Oral (11/05 0615) BP: 144/91 mmHg (11/05 0615) Pulse Rate: 80  (11/05 0615)  Labs:  Basename 11/28/10 0500 11/27/10 0545 11/27/10 0300 11/26/10 0500  HGB -- 10.2* -- 9.8*  HCT -- 30.2* -- 29.8*  PLT -- 156 -- 127*  APTT -- -- -- --  LABPROT 24.0* -- 22.9* 17.3*  INR 2.11* -- 1.99* 1.39  HEPARINUNFRC -- -- -- --  CREATININE -- -- 0.97 1.12*  CKTOTAL -- -- -- --  CKMB -- -- -- --  TROPONINI -- -- -- --   Estimated Creatinine Clearance: 85.2 ml/min (by C-G formula based on Cr of 0.97).   Medications:  Scheduled:    . ALPRAZolam  1 mg Oral TID  . amLODipine  5 mg Oral Daily  . budesonide-formoterol  2 puff Inhalation BID  . Chlorhexidine Gluconate Cloth  6 each Topical Q0600  . docusate sodium  100 mg Oral BID  . doxepin  150 mg Oral QHS  . doxycycline  100 mg Oral Q12H  . enoxaparin (LOVENOX) injection  120 mg Subcutaneous Q12H  . entecavir  0.5 mg Oral Daily  . escitalopram  20 mg Oral Daily  . iloperidone  12 mg Oral Daily  . mupirocin   Nasal BID  . nicotine  21 mg Transdermal Daily  . pantoprazole  40 mg Oral Q12H  . predniSONE  20 mg Oral Daily  . sodium chloride  3 mL Intravenous Q12H  . tenofovir  300 mg Oral Daily  . tiotropium  18 mcg Inhalation Daily  . warfarin  4 mg Oral ONCE-1800  . DISCONTD: predniSONE  10 mg Oral Daily    Assessment: 55 year old with pulmonary embolus.  On day 4/5 of Lovenox / Coumadin overlap.  Today's INR=2.11 Goal of Therapy:  INR 2-3   Plan:  Lovenox 120 mg  sq q12 - continue through 11.6.12 Coumadin 4 mg po x 1 dose today Recheck INR in AM  Elwin Sleight 11/28/2010,8:50 AM

## 2010-11-28 NOTE — Progress Notes (Signed)
Physical Therapy Treatment Patient Details Name: [name] MRN: [id number] DOB: Jul 15, 1955 Today's Date: 11/28/2010  PT Assessment/Plan  PT - Assessment/Plan Comments on Treatment Session: Pt very self limiting this session, needed intermitten cueing for diaphragmatic and pursed lip breathing as SaO2 stayed around 88-90% on RA, pt left with [place] 2L O2 at end of session PT Plan: Discharge plan remains appropriate PT Frequency: Min 5X/week Follow Up Recommendations: Skilled nursing facility Equipment Recommended: Defer to next venue PT Goals  Acute Rehab PT Goals PT Goal Formulation: With patient Time For Goal Achievement: 7 days Pt will Roll Supine to Right Side: with modified independence PT Goal: Rolling Supine to Right Side - Progress: Met Pt will go Supine/Side to Sit: Independently PT Goal: Supine/Side to Sit - Progress: Progressing toward goal Pt will Transfer Sit to Stand/Stand to Sit: with modified independence PT Transfer Goal: Sit to Stand/Stand to Sit - Progress: Progressing toward goal Pt will Transfer Bed to Chair/Chair to Bed: with modified independence PT Transfer Goal: Bed to Chair/Chair to Bed - Progress: Progressing toward goal Pt will Ambulate: 1 - 15 feet;with rolling walker;with min assist PT Goal: Ambulate - Progress: Progressing toward goal  PT Treatment Precautions/Restrictions  Pt is NWB LLE with CAM boot at all times when OOB  Mobility (including Balance) Bed Mobility Bed Mobility: Yes Supine to Sit: 6: Modified independent (Device/Increase time);Other (comment) (Pt requesting elevate HOB approx 40 degrees for modI) Sit to Supine - Left: 6: Modified independent (Device/Increase time);Other (comment) (pt requires HOB elevated for modI) Transfers Transfers: Yes Sit to Stand: 4: Min assist;From bed Sit to Stand Details (indicate cue type and reason): min cueing for safe technique especially NWB LLE and safe hand placement, pt very impulsively trying to  stand, bed also elevated to assist with standing Stand to Sit: 4: Min assist;Other (comment) Stand to Sit Details: pt very impulsively sitting with uncontrolled "plop" into chair regardless of cueing, sat in recliner Ambulation/Gait Ambulation/Gait: Yes Ambulation/Gait Assistance: 4: Min assist Ambulation/Gait Assistance Details (indicate cue type and reason): Pt amb approx 8 ft with RW and minA for safe sequencing, pt with better ability to hold LLE NWB today, step/to hop on RLE Ambulation Distance (Feet): 8 Feet Assistive device: Rolling walker Gait Pattern: Step-to pattern  Posture/Postural Control Posture/Postural Control: No significant limitations Balance Balance Assessed: No Exercise    End of Session  Pt left in recliner with LLE still in CAM boot and propped with one pillow. Call bell and telephone within reach. RN aware of session. Internal medicine MD in room with patient.   [facility] HELEN 11/28/2010, 9:43 AM

## 2010-11-29 MED ORDER — WARFARIN SODIUM 7.5 MG PO TABS
7.5000 mg | ORAL_TABLET | Freq: Once | ORAL | Status: AC
  Administered 2010-11-29: 7.5 mg via ORAL
  Filled 2010-11-29: qty 1

## 2010-11-29 MED ORDER — MORPHINE SULFATE 4 MG/ML IJ SOLN: 2.0000 mg | INTRAMUSCULAR | Status: DC | PRN

## 2010-11-29 MED ORDER — PREDNISONE 10 MG PO TABS
10.0000 mg | ORAL_TABLET | Freq: Every day | ORAL | Status: DC
  Administered 2010-11-29 – 2010-11-30 (×2): 10 mg via ORAL
  Filled 2010-11-29 (×3): qty 1

## 2010-11-29 NOTE — Progress Notes (Signed)
Clinical Social Work-CSW contacted pt who has chosen [facility]- CSW has left two messages for SNF to confirm; CSW will facilitate d/c in AM once paperwork and orders complete. Trula Ore Alyshia Kernan-MSW-[phone]

## 2010-11-29 NOTE — Progress Notes (Signed)
ANTICOAGULATION CONSULT NOTE - Follow Up Consult  Pharmacy Consult for Coumadin / Lovenox Indication: pulmonary embolus  Allergies  Allergen Reactions  . Duloxetine     REACTION: HA  . Gabapentin     REACTION: rash in mouth  . Triple Antibiotic     REACTION: rash/hives    Patient Measurements: Height: 5\' 5"  (165.1 cm) (Entered for Cutover) Weight: 255 lb 1.2 oz (115.7 kg) IBW/kg (Calculated) : 57    Vital Signs: Temp: 98 F (36.7 C) (11/06 0518) Temp src: Oral (11/06 0518) BP: 141/85 mmHg (11/06 0518) Pulse Rate: 82  (11/06 0518)  Labs:  Basename 11/29/10 0614 11/28/10 0500 11/27/10 0545 11/27/10 0300  HGB -- -- 10.2* --  HCT -- -- 30.2* --  PLT -- -- 156 --  APTT -- -- -- --  LABPROT 21.0* 24.0* -- 22.9*  INR 1.78* 2.11* -- 1.99*  HEPARINUNFRC -- -- -- --  CREATININE -- -- -- 0.97  CKTOTAL -- -- -- --  CKMB -- -- -- --  TROPONINI -- -- -- --   Estimated Creatinine Clearance: 85.2 ml/min (by C-G formula based on Cr of 0.97).   Medications:  Scheduled:    . ALPRAZolam  1 mg Oral TID  . amLODipine  5 mg Oral Daily  . budesonide-formoterol  2 puff Inhalation BID  . Chlorhexidine Gluconate Cloth  6 each Topical Q0600  . docusate sodium  100 mg Oral BID  . doxepin  150 mg Oral QHS  . enoxaparin (LOVENOX) injection  120 mg Subcutaneous Q12H  . entecavir  0.5 mg Oral Daily  . escitalopram  20 mg Oral Daily  . iloperidone  12 mg Oral Daily  . mupirocin   Nasal BID  . nicotine  21 mg Transdermal Daily  . pantoprazole  40 mg Oral Q12H  . predniSONE  10 mg Oral QAC breakfast  . predniSONE  20 mg Oral Daily  . sodium chloride  3 mL Intravenous Q12H  . tenofovir  300 mg Oral Daily  . tiotropium  18 mcg Inhalation Daily  . warfarin  4 mg Oral ONCE-1800    Assessment: Patient is a 55 y/o female on anticoagulation, Day #5 Coumadin/Lovenox , for PE. Goal of Therapy:  INR 2-3   Plan:  Lovenox bridging until INR remains > 2 (therapeutic) for 24  hours. Continue Lovenox 120mg  sq q 12hr. Coumadin 7.5mg  today.   Dakarai Mcglocklin, Elisha Headland, Pharm.D. 11/29/2010 10:22 AM

## 2010-11-29 NOTE — Progress Notes (Signed)
Subjective: Doing well, pain pretty well controlled on PO oxycodone.  Only used morphine twice yesterday.  Feels better overall, no major c/o.  Objective: Vital signs in last 24 hours: Filed Vitals:   11/28/10 1500 11/28/10 2018 11/28/10 2132 11/29/10 0518  BP: 128/82  126/75 141/85  Pulse: 86  78 82  Temp: 97.5 F (36.4 C)  98.3 F (36.8 C) 98 F (36.7 C)  TempSrc:   Oral Oral  Resp: 20  18 18   Height:      Weight:      SpO2: 93% 98% 94% 93%   Weight change:   Intake/Output Summary (Last 24 hours) at 11/29/10 0849 Last data filed at 11/28/10 1700  Gross per 24 hour  Intake    365 ml  Output    800 ml  Net   -435 ml   Physical Exam: GEN: NAD. Alert and oriented x 3. Pleasant, conversant, and cooperative to exam.  RESP: Poor air movement in LL b/l, mild end expiratory wheezes in mid/lower lungs. CARDIOVASCULAR: RRR, S1, S2, no m/r/g  ABDOMEN: soft, NT/ND, NABS  EXT: bandaged LLE  Lab Results: Basic Metabolic Panel:  Lab 11/27/10 1610 11/26/10 0500  NA 135 136  K 3.8 3.3*  CL 99 100  CO2 27 27  GLUCOSE 120* 93  BUN 15 15  CREATININE 0.97 1.12*  CALCIUM 8.8 8.5  MG -- --  PHOS -- --   CBC:  Lab 11/27/10 0545 11/26/10 0500  WBC 6.3 6.2  NEUTROABS -- --  HGB 10.2* 9.8*  HCT 30.2* 29.8*  MCV 93.8 93.7  PLT 156 127*   CBG:  Lab 11/24/10 2157  GLUCAP 129*   Coagulation:  Lab 11/29/10 0614 11/28/10 0500 11/27/10 0300 11/26/10 0500  LABPROT 21.0* 24.0* 22.9* 17.3*  INR 1.78* 2.11* 1.99* 1.39    Micro Results: Recent Results (from the past 240 hour(s))  MRSA PCR SCREENING     Status: Abnormal   Collection Time   11/24/10 10:28 AM      Component Value Range Status Comment   MRSA by PCR POSITIVE (*) NEGATIVE  Final    Studies/Results: No results found. Medications: I have reviewed the patient's current medications. Scheduled Meds:   . ALPRAZolam  1 mg Oral TID  . amLODipine  5 mg Oral Daily  . budesonide-formoterol  2 puff Inhalation BID  .  Chlorhexidine Gluconate Cloth  6 each Topical Q0600  . docusate sodium  100 mg Oral BID  . doxepin  150 mg Oral QHS  . enoxaparin (LOVENOX) injection  120 mg Subcutaneous Q12H  . entecavir  0.5 mg Oral Daily  . escitalopram  20 mg Oral Daily  . iloperidone  12 mg Oral Daily  . mupirocin   Nasal BID  . nicotine  21 mg Transdermal Daily  . pantoprazole  40 mg Oral Q12H  . predniSONE  20 mg Oral Daily  . sodium chloride  3 mL Intravenous Q12H  . tenofovir  300 mg Oral Daily  . tiotropium  18 mcg Inhalation Daily  . warfarin  4 mg Oral ONCE-1800  . DISCONTD: doxycycline  100 mg Oral Q12H   Continuous Infusions:  PRN Meds:.acetaminophen, albuterol, morphine, oxyCODONE, traMADol, DISCONTD:  morphine injection Assessment/Plan: # Ankle fracture, left: POD #4 from ORIF. Pain has been controlled with PO meds.  Pt will go to SNF for extended PT - appreciate ortho rec's  - con't oxycodone  - finalize SNF plans  # Pulmonary embolism: She is back  on Lovenox and Coumadin. INR sub-therapeutic today.  - appreciate pharmacy rec's - continue coumadin  - con't lovenox   # Acute exacerbation of chronic obstructive pulmonary disease (COPD): Breathing well today, sats occasionally at 90%.  Some wheezes on exam, likely at baseline. - con't prednisone taper  - con't IS   # ANEMIA, IRON DEFICIENCY: HgB stable.   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   # HTN: BP well controlled   # ANXIETY: Stable currently on her home medications.   # Acute on chronic renal failure: resolved with fluids. Currently at baseline.  #Dispo: hopefully tomorrow to SNF   LOS: 8 days   [facility], BEN 11/29/2010, 8:49 AM

## 2010-11-29 NOTE — Progress Notes (Signed)
  Subjective:    [name] is now 4 days post ORIF of her complex left ankle fracture.  Is is doing a little better tolerating her pain.  She has been working with PT.  Objective/Exam:  Her dressing was removed from her left ankle and a new dressing applied.  Her left foot is well perfused and her sutures are intact.  No drainage.  Assessment:  Post fixation of complex left ankle fracture  Plan:  From ortho standpoint, I will keep her non-weight bearing on her left ankle for the next 4 weeks.  The current dressing can be left in place.  I will need to see her in the office around Nov. 14th or 15th for a post-op follow-up at [facility] [phone].

## 2010-11-29 NOTE — Progress Notes (Signed)
Internal Medicine Attending  Date: 11/29/2010  Patient name: [name] Medical record number: [id number] Date of birth: 06-10-1955 Age: 55 y.o. Gender: female  I saw and evaluated the patient. I reviewed the resident's note by Dr. Abner Greenspan and I agree with the resident's findings and plans as documented in his note.

## 2010-11-29 NOTE — Progress Notes (Signed)
Physical Therapy Treatment Patient Details Name: [name] MRN: [id number] DOB: 10/04/1955 Today's Date: 11/29/2010  PT Assessment/Plan  PT - Assessment/Plan Comments on Treatment Session: Pt. still limited by fatique and decreased activity tolerance. Pt having difficult time maintaining NWB LLE.  PT Plan: Discharge plan remains appropriate PT Goals  Acute Rehab PT Goals PT Goal: Rolling Supine to Right Side - Progress: Progressing toward goal PT Goal: Supine/Side to Sit - Progress: Progressing toward goal PT Transfer Goal: Sit to Stand/Stand to Sit - Progress: Progressing toward goal PT Transfer Goal: Bed to Chair/Chair to Bed - Progress: Progressing toward goal PT Goal: Ambulate - Progress: Progressing toward goal  PT Treatment Precautions/Restrictions  Precautions Precautions: Fall Required Braces or Orthoses: Yes Other Brace/Splint: CAM boot to left ankle at all times. Boot not on in bed when entered room.  Restrictions Weight Bearing Restrictions: Yes RUE Weight Bearing: Non weight bearing LLE Weight Bearing: Non weight bearing Mobility (including Balance) Bed Mobility Bed Mobility: Yes Supine to Sit: 4: Min assist;HOB flat;With rails Supine to Sit Details (indicate cue type and reason): A for shoulder out of flat bed. Cues for safe technique Transfers Transfers: Yes Sit to Stand: 4: Min assist;From bed;From chair/3-in-1 Sit to Stand Details (indicate cue type and reason): Required cues for NWBing precautions. Cues for safe hand placement. A required for balance Stand to Sit: 4: Min assist;To chair/3-in-1 Stand to Sit Details: A to sit slowly and safe hand placement.  Ambulation/Gait Ambulation/Gait: Yes Ambulation/Gait Assistance: 3: Mod assist Ambulation/Gait Assistance Details (indicate cue type and reason): A for balance and to maintain NWB status. Pt with lateral right side LOBx1. Pt having difficult time maintaining NWB with ambulation.  Ambulation Distance  (Feet): 12 Feet (one seated rest break) Assistive device: Rolling walker Gait Pattern: Step-to pattern Gait velocity: Hop to step pattern Stairs: No    Exercise    End of Session PT - End of Session Equipment Utilized During Treatment: Gait belt;Other (comment) (CAM boot L foot) Activity Tolerance: Patient tolerated treatment well Patient left: in chair;with call bell in reach General Behavior During Session: [facility] for tasks performed Cognition: [facility] for tasks performed  [place], Adline Potter 11/29/2010, 11:12 AM

## 2010-11-30 LAB — PROTIME-INR
INR: 1.62 — ABNORMAL HIGH (ref 0.00–1.49)
Prothrombin Time: 19.5 seconds — ABNORMAL HIGH (ref 11.6–15.2)

## 2010-11-30 LAB — CBC
HCT: 34.1 % — ABNORMAL LOW (ref 36.0–46.0)
Hemoglobin: 11.1 g/dL — ABNORMAL LOW (ref 12.0–15.0)
MCH: 31.3 pg (ref 26.0–34.0)
RBC: 3.55 MIL/uL — ABNORMAL LOW (ref 3.87–5.11)

## 2010-11-30 MED ORDER — OXYCODONE HCL 5 MG PO TABS: 5.0000 mg | ORAL_TABLET | Freq: Four times a day (QID) | ORAL | Status: AC | PRN

## 2010-11-30 MED ORDER — NICOTINE 21 MG/24HR TD PT24: 42.0000 | MEDICATED_PATCH | Freq: Every day | TRANSDERMAL | Status: AC

## 2010-11-30 MED ORDER — ENOXAPARIN SODIUM 120 MG/0.8ML ~~LOC~~ SOLN: 120.0000 mg | Freq: Two times a day (BID) | SUBCUTANEOUS | Status: DC

## 2010-11-30 MED ORDER — NICOTINE 14 MG/24HR TD PT24: 14.0000 | MEDICATED_PATCH | TRANSDERMAL | Status: AC

## 2010-11-30 MED ORDER — ALPRAZOLAM 1 MG PO TABS: 1.0000 mg | ORAL_TABLET | Freq: Three times a day (TID) | ORAL | Status: AC

## 2010-11-30 MED ORDER — NICOTINE 7 MG/24HR TD PT24: 1.0000 | MEDICATED_PATCH | TRANSDERMAL | Status: AC

## 2010-11-30 MED ORDER — WARFARIN SODIUM 7.5 MG PO TABS: 7.5000 mg | ORAL_TABLET | Freq: Every day | ORAL | Status: DC

## 2010-11-30 MED ORDER — WARFARIN SODIUM 7.5 MG PO TABS
7.5000 mg | ORAL_TABLET | Freq: Once | ORAL | Status: DC
  Filled 2010-11-30: qty 1

## 2010-11-30 NOTE — Discharge Summary (Signed)
[facility] Discharge Note  Name: [name] MRN: [id number] DOB: [birth year] 55 y.o.  Date of Admission: 11/21/2010  4:33 PM Date of Discharge: 11/30/2010 Attending Physician: Farley Ly, MD  Discharge Diagnosis: Principal Problem:  *Ankle fracture, left Active Problems:  HEPATITIS B  ANEMIA, IRON DEFICIENCY  ANXIETY  HYPERTENSION  Tobacco use disorder  Cocaine abuse  PE (pulmonary embolism)   Discharge Medications: Current Discharge Medication List    START taking these medications   Details  enoxaparin (LOVENOX) 120 MG/0.8ML SOLN Inject 0.8 mLs (120 mg total) into the skin every 12 (twelve) hours. Qty: 12.6 mL, Refills: 5    nicotine (NICODERM CQ - DOSED IN MG/24 HOURS) 14 mg/24hr patch Place 14 patches onto the skin daily. Qty: 14 patch, Refills: 0    nicotine (NICODERM CQ - DOSED IN MG/24 HOURS) 21 mg/24hr patch Place 42 patches onto the skin daily. Qty: 21 patch, Refills: 1    nicotine (NICODERM CQ - DOSED IN MG/24 HR) 7 mg/24hr patch Place 1 patch onto the skin daily. Qty: 14 patch, Refills: 0    oxyCODONE (OXY IR/ROXICODONE) 5 MG immediate release tablet Take 1 tablet (5 mg total) by mouth every 6 (six) hours as needed for pain. Qty: 40 tablet, Refills: 0    warfarin (COUMADIN) 7.5 MG tablet Take 1 tablet (7.5 mg total) by mouth daily. Qty: 45 tablet, Refills: 3      CONTINUE these medications which have CHANGED   Details  ALPRAZolam (XANAX) 1 MG tablet Take 1 tablet (1 mg total) by mouth 3 (three) times daily. Qty: 90 tablet, Refills: 1      CONTINUE these medications which have NOT CHANGED   Details  albuterol (PROVENTIL HFA;VENTOLIN HFA) 108 (90 BASE) MCG/ACT inhaler Inhale 2 puffs into the lungs every 6 (six) hours as needed. For shortness of breath     aspirin EC 81 MG tablet Take 81 mg by mouth daily.      budesonide-formoterol (SYMBICORT) 160-4.5 MCG/ACT inhaler Inhale 2 puffs into the lungs 2 (two) times  daily.      carisoprodol (SOMA) 350 MG tablet Take 350-700 mg by mouth at bedtime as needed. For muscle spasms     citalopram (CELEXA) 20 MG tablet Take 20 mg by mouth daily.      docusate sodium (COLACE) 100 MG capsule Take 100 mg by mouth 2 (two) times daily as needed.      doxepin (SINEQUAN) 150 MG capsule Take 150 mg by mouth at bedtime.      entecavir (BARACLUDE) 0.5 MG tablet Take 0.5 mg by mouth daily.      escitalopram (LEXAPRO) 20 MG tablet Take 20 mg by mouth daily.      estazolam (PROSOM) 2 MG tablet Take 2 mg by mouth at bedtime.      Iloperidone 6 MG TABS Take 2 tablets by mouth every morning.      loratadine (CLARITIN) 10 MG tablet Take 10 mg by mouth daily.      metoprolol succinate (TOPROL-XL) 25 MG 24 hr tablet Take 25 mg by mouth daily.      pantoprazole (PROTONIX) 40 MG tablet Take 40 mg by mouth 2 (two) times daily.      spironolactone (ALDACTONE) 25 MG tablet Take 25 mg by mouth 2 (two) times daily.      tenofovir (VIREAD) 300 MG tablet Take 300 mg by mouth daily.      tiotropium (SPIRIVA) 18 MCG inhalation capsule Place 18  mcg into inhaler and inhale daily.        STOP taking these medications     celecoxib (CELEBREX) 200 MG capsule      oxyCODONE-acetaminophen (PERCOCET) 10-325 MG per tablet         Disposition and follow-up:   Ms.[name] was discharged from [facility] in stable condition.  She has multiple f/u appts and will be cared for at a Skilled Nursing facility for physical therapy and rehab of her L ankle fracture.  Follow-up Appointments: Follow-up Information    Follow up with Dr. Vinnie Level on 12/21/2010. (9:40am)       Follow up with Kathryne Hitch on 12/07/2010. (1:00pm)    Contact information:   [facility] [address] [place] [address] [phone]       Follow up with Ellis Savage, [facility] on 12/13/2010. (1:00pm)            Discharge Orders    Future Orders Please Complete By Expires   Diet - low sodium heart healthy      Discharge instructions      Comments:   Pt will need close monitoring of her INR.  She will need BID lovenox until her INR is therapeutic for at least two days.  She should get coumadin 7.5mg  this evening, INR should be rechecked tomorrow.  Her dose may need to be 10mg , but her INR has been somewhat volatile.   Other Restrictions      Comments:   Non weight bearing on L ankle for 4 weeks.   May shower / Bathe      Leave dressing on - Keep it clean, dry, and intact until clinic visit      Call MD for:  temperature >100.4      Call MD for:  severe uncontrolled pain      Call MD for:  redness, tenderness, or signs of infection (pain, swelling, redness, odor or green/yellow discharge around incision site)         Consultations:  Orthopedics  Procedures Performed:  Dg Chest 2 View  11/21/2010  *RADIOLOGY REPORT*  Clinical Data: Altered mental status.  Short of breath  CHEST - 2 VIEW  Comparison: 11/18/2010  Findings:  Increase in bibasilar airspace disease, right greater than left.  This may represent atelectasis or pneumonia.  Negative for heart failure or effusion.  IMPRESSION: Increase in bibasilar atelectasis/pneumonia.  Original Report Authenticated By: Camelia Phenes, M.D.   Dg Chest 2 View  11/02/2010  *RADIOLOGY REPORT*  Clinical Data: Cough, shortness of breath.  CHEST - 2 VIEW  Comparison: Chest x-ray 10/31/2010.  Findings: The cardiac silhouette, mediastinal and hilar contours are stable.  Minimal improved bibasilar atelectasis.  No infiltrates, edema or effusions.  The bony thorax is intact.  IMPRESSION: Minimal bibasilar atelectasis, improved since prior study.  Original Report Authenticated By: P. Loralie Champagne, M.D.   Dg Ankle Complete Left Cjm Cjm Cjm Cjm Cjm Cjm Cjm Cjm Cjm Cjm Cjm Cjm Cjm Cjm Cjm Cjm Cjm Cjm  11/25/2010  *RADIOLOGY REPORT*  Clinical Data: Left ankle  operative fixation for trimalleolar fractures.  LEFT ANKLE COMPLETE - 3+ VIEW  Comparison: 11/18/2010.  Findings: Three C-arm views of the left ankle demonstrate screw fixation of the previously demonstrated medial malleolus fracture with anatomic position and alignment.  There is also a screw and plate fixation of the previously seen lateral malleolus fracture with anatomic position and alignment.  The previously  seen posterior malleolus fracture is not well visualized due to overlapping of the hardware.  IMPRESSION: Hardware fixation of the previously seen medial and lateral malleolus fractures with anatomic position and alignment.  Original Report Authenticated By: Darrol Angel, M.D.   Dg Ankle Complete Left  11/18/2010  *RADIOLOGY REPORT*  Clinical Data: Postreduction films after left ankle injury  LEFT ANKLE COMPLETE - 3+ VIEW  Comparison: None.  Findings: Post casting views of the left ankle demonstrate fractures of the distal left fibula, medial malleolus, and posterior malleolus with mild lateral angulation and displacement of the distal fracture fragments.  Medial and posterior malleolar fracture lines extend to the ankle joint.  Suggestion of minimal fragmentation or avulsion from the anterior malleolus.  Overlying cast material obscures bony detail.  IMPRESSION: Trimalleolar fractures of the left ankle with lateral displacement. Small avulsion fractures suggested off of the anterior malleolus.  Original Report Authenticated By: Marlon Pel, M.D.   Ct Head Wo Contrast  11/18/2010  *RADIOLOGY REPORT*  Clinical Data: Altered mental status, confusion, recently diagnosed with pneumonia, cirrhosis  CT HEAD WITHOUT CONTRAST  Technique:  Contiguous axial images were obtained from the base of the skull through the vertex without contrast.  Comparison: 09/23/2005  Findings: No evidence of parenchymal hemorrhage or extra-axial fluid collection.  No mass lesion, mass effect, or midline shift.  No CT  evidence of acute infarction.  Mild periventricular small vessel ischemic changes.  The visualized paranasal sinuses are essentially clear. The mastoid air cells are unopacified.  No evidence of calvarial fracture.  IMPRESSION: No evidence of acute intracranial abnormality.  Mild small vessel ischemic changes.  Original Report Authenticated By: Charline Bills, M.D.   Ct Angio Chest W/cm &/or Wo Cm  11/22/2010  **ADDENDUM** CREATED: 11/22/2010 13:08:03  Critical Value/emergent results were called by telephone at the time of interpretation on 11/22/2010  at 1305 hours  to  Dr. Blanch Media, who verbally acknowledged these results.  **END ADDENDUM** SIGNED BY: Charline Bills, M.D.   11/22/2010  *RADIOLOGY REPORT*  Clinical Data:  Cough, chest pain, evaluate for PE  CT ANGIOGRAPHY CHEST WITH CONTRAST  Technique:  Multidetector CT imaging of the chest was performed using the standard protocol during bolus administration of intravenous contrast.  Multiplanar CT image reconstructions including MIPs were obtained to evaluate the vascular anatomy.  Contrast:  80 ml Omnipaque-300 IV  Comparison:  Chest radiographs dated 11/21/2010  Findings:  Evaluation is mildly constrained due to difficulty with bolus timing leading to preferential opacification of the aorta/pulmonary veins.  Suspected subocclusive embolus within a subsegmental branch of the right lower lobe pulmonary artery (series 5/image 144).  Multifocal patchy opacities in the medial right upper lobe (series 6/image 50), linguola (series 6/image 85), subpleural right middle lobe and right lower lobe (series 6/image 85), and left lower lobe (series 6/image 91).  The appearance of the right lower lobe opacity is suspicious for pulmonary infarct.  The additional opacities may be infectious/inflammatory.  Small right and trace left pleural effusions with associated lower lobe atelectasis.  No pneumothorax.  The heart is top normal in size.  No pericardial  effusion.  No suspicious mediastinal, hilar, or axillary lymphadenopathy.  Visualized upper abdomen is notable for cirrhosis. Mild degenerative changes of the visualized thoracolumbar spine.  Review of the MIP images confirms the above findings.  IMPRESSION: Suspected subocclusive embolus within a subsegmental branch of the right lower lobe pulmonary artery.  Overall clot burden is small.  Suspected pulmonary infarct in  the right lower lobe.  Additional multifocal patchy opacities, as described above, possibly infectious/inflammatory.  Small right and trace left pleural effusions with associated lower lobe atelectasis.  Cirrhosis.  Original Report Authenticated By: Charline Bills, M.D.   Dg Pneumonia Chest Port1v  11/18/2010  *RADIOLOGY REPORT*  Clinical Data: 54 year old female with cough, congestion and shortness of breath.  PORTABLE CHEST - 1 VIEW  Comparison: 11/13/2010 and prior chest radiographs dating back to 03/11/2010  Findings: The cardiomediastinal silhouette is within normal limits. Mild peribronchial thickening is unchanged. There is no evidence of focal airspace disease, pulmonary edema, pulmonary nodule/mass, pleural effusion, or pneumothorax. No acute bony abnormalities are identified.  IMPRESSION: No evidence of acute cardiopulmonary disease.  Original Report Authenticated By: Rosendo Gros, M.D.   Dg Pneumonia Chest 2v  11/13/2010  *RADIOLOGY REPORT*  Clinical Data: Cough, chest pain, wheezing and chest congestion.  CHEST - 2 VIEW  Comparison: Chest radiograph performed 11/02/2010  Findings: The lungs are well-aerated.  Recurrent left basilar linear opacity likely reflects atelectasis, though mild pneumonia could have a similar appearance.  There is no evidence of pleural effusion or pneumothorax.  The heart is normal in size; the mediastinal contour is within normal limits.  No acute osseous abnormalities are seen.  IMPRESSION: Recurrent left basilar linear opacity likely reflects  persistent atelectasis, though mild pneumonia could have a similar appearance.  Original Report Authenticated By: Tonia Ghent, M.D.   Dg C-arm 1-60 Min  11/25/2010  CLINICAL DATA: orif left ankle   C-ARM 1-60 MINUTES  Fluoroscopy was utilized by the requesting physician.  No radiographic  interpretation.     Admission HPI:  Patient is a 55 yo woman with h/o severe COPD, HTN, cirrhosis and recent ankle fracture p/w acute onset confusion as per her fiancee, who reports that she was sweating & nervous. She says that she was confused, but was able to call 911 as symptoms of CP worsened. She has recently been under significant pain of her left ankle that she fractured in October 2012 (presented to [place] on 11/18/10, ortho was consulted and did not see necessity for surgical intervention, ankle was wrapped) and has taken 20 percocet (5-325 per ED note 11/18/10) in 3 days. She reports that she has not taken any percocet on the day of admission, and it is unclear if she took any pain medications on the day prior to admission. On the day of admission, in addition to confusion/diaphoresis, she presents with 7/10, sharp, dull, nonradiating, intermittent mid substernal CP that has also been ongoing for 1 month and lasts for about 5-10 minutes/episode. She notes similar sensation about 1 year ago, for which a cardiologist told her that she had a thick left ventricle. She has not history of MI. She denies alleviating or worsening factors for CP. She endorses worsening nonproductive cough & wheezing over the last month. She was admitted Oct 8-Nov 04, 2010 and treated for COPD exacerbation with levaquin & prednisone taper. She reports completing the course of both regimens. She reports some relief of SOB with use of her albuterol inhaler every 4 hours. Patient reports compliance with COPD medications. She denies fever/chills and has not been around sick contacts.    Hospital Course by problem list: # Altered mental  status, resolved:  Given history of taking roughly 20 Percocet over 3 days, most likely 2/2 opiate overdose, as patient's mental status significantly improved after treatment with Narcan (0.4mg , 1mg , 0.4mg , 1mg ) and copious IVF.  Given history of cirrhosis,  AMS 2/2 elevated ammonia levels was ruled out. She will be followed at a skilled nursing facility after discharge, which will keep her safe as she completes her rehabilitation. She has a followup appointment with her primary care doctor. She also sees her psychiatrist regularly, and has a followup appointment on November 20 at 1 PM.  #Pulmonary embolism Patient presented with chest pain and dyspnea, and was found to have possible pulmonary emboli. She was started on therapeutic Lovenox, and began bridging with Coumadin. This was likely provoked by her fracture. Her chest pain and shortness of breath resolved during the duration of her hospitalization. She had negative pain is Dopplers. She will need to be on Coumadin for 3 months, as we are treating this as a provoked thrombosis. Her INR at time of discharge was subtherapeutic at 1.78, so she will be continued on Lovenox at her skilled nursing facility until she is therapeutic. After 2 days of overlap, she can be continued on Coumadin only. She will need close monitoring of her INR.  Please see instructions above.  # Ankle fracture, left Patient had been diagnosed with a left ankle fracture on previous admission on 11/18/2010 required no surgical intervention at that time. This admission, however, orthopedics felt that her fracture was unstable and would require surgical intervention. The patient underwent an ORIF on November 2. Patient tolerated the procedure well, and will followup with orthopedics on November 14 at 1 PM with Dr. Magnus Ivan. She'll be discharged to a skilled nursing facility for continued physical therapy.  She will be non weight bearing for 4 weeks.  Please see further instructions  above.  # COPD exacerbation The patient presented with increased cough, shortness of breath, and wheezing, with a prior history of COPD exacerbations. She was started on doxycycline, prednisone, and duo nebs. Her breathing improved throughout the duration of her hospitalization, and by the time of discharge she had scant wheezes in her lower lung fields. Her oxygen saturation was in the low to mid 90s, and she was breathing comfortably.  # Substance abuse Patient denied any recent drug use, but was cocaine positive on UDS at admission. Patient is also a tobacco user. Patient has an outpatient psychiatrist with whom she works on these issues.  #Acute kidney injury Patient presented with a creatinine of 2.08. After fluid hydration she returned to baseline around 1.  #Hepatitis B Patient was continued on her entecavir and tenofovir during this hospitalization.  #Psychiatric problems This patient has a history of anxiety and panic disorder, bipolar 2 disorder, and dysthymic disorder, and is treated by Ellis Savage in [place]. She was continued on her outpatient regimen of Lexapro, Xanax, Fanapt, doxepin, ProSom, and soma for various issues. After her altered mental status had regressed, patient was quite pleasant throughout the duration of her hospitalization. She has a followup with her psychiatrist NP on 11/20 at 1 PM.  #Anemia Patient presented with anemia below her baseline. Workup indicated a component of iron deficiency anemia, which we will treat. This should be followed as an outpatient with her primary care provider. She has had recent outpatient colonoscopy that has indicated no acute pathology.  Discharge Vitals:  BP 96/63  Pulse 94  Temp(Src) 98.6 F (37 C) (Oral)  Resp 20  Ht 5\' 5"  (1.651 m)  Wt 255 lb 1.2 oz (115.7 kg)  BMI 42.45 kg/m2  SpO2 96%  Discharge Labs:  Results for orders placed during the hospital encounter of 11/21/10 (from the past 24 hour(s))  PROTIME-INR  Status: Abnormal   Collection Time   11/30/10  7:00 AM      Component Value Range   Prothrombin Time 19.5 (*) 11.6 - 15.2 (seconds)   INR 1.62 (*) 0.00 - 1.49   CBC     Status: Abnormal   Collection Time   11/30/10  7:00 AM      Component Value Range   WBC 6.3  4.0 - 10.5 (K/uL)   RBC 3.55 (*) 3.87 - 5.11 (MIL/uL)   Hemoglobin 11.1 (*) 12.0 - 15.0 (g/dL)   HCT 91.4 (*) 78.2 - 46.0 (%)   MCV 96.1  78.0 - 100.0 (fL)   MCH 31.3  26.0 - 34.0 (pg)   MCHC 32.6  30.0 - 36.0 (g/dL)   RDW 95.6  21.3 - 08.6 (%)   Platelets 197  150 - 400 (K/uL)    SignedAbner Greenspan, BEN 11/30/2010, 11:50 AM

## 2010-11-30 NOTE — Progress Notes (Signed)
ANTICOAGULATION CONSULT NOTE - Follow Up Consult  Pharmacy Consult for Coumadin/Lovenox Indication: pulmonary embolus  Allergies  Allergen Reactions  . Duloxetine     REACTION: HA  . Gabapentin     REACTION: rash in mouth  . Triple Antibiotic     REACTION: rash/hives    Patient Measurements: Height: 5\' 5"  (165.1 cm) (Entered for Cutover) Weight: 255 lb 1.2 oz (115.7 kg) IBW/kg (Calculated) : 57    Vital Signs: Temp: 98.6 F (37 C) (11/07 0512) Temp src: Oral (11/07 0512) BP: 96/63 mmHg (11/07 0512) Pulse Rate: 94  (11/07 0512)  Labs:  Basename 11/30/10 0700 11/29/10 0614 11/28/10 0500  HGB 11.1* -- --  HCT 34.1* -- --  PLT 197 -- --  APTT -- -- --  LABPROT 19.5* 21.0* 24.0*  INR 1.62* 1.78* 2.11*  HEPARINUNFRC -- -- --  CREATININE -- -- --  CKTOTAL -- -- --  CKMB -- -- --  TROPONINI -- -- --   Estimated Creatinine Clearance: 85.2 ml/min (by C-G formula based on Cr of 0.97).   Medications:  Scheduled:    . ALPRAZolam  1 mg Oral TID  . amLODipine  5 mg Oral Daily  . budesonide-formoterol  2 puff Inhalation BID  . Chlorhexidine Gluconate Cloth  6 each Topical Q0600  . docusate sodium  100 mg Oral BID  . doxepin  150 mg Oral QHS  . enoxaparin (LOVENOX) injection  120 mg Subcutaneous Q12H  . entecavir  0.5 mg Oral Daily  . escitalopram  20 mg Oral Daily  . iloperidone  12 mg Oral Daily  . mupirocin   Nasal BID  . nicotine  21 mg Transdermal Daily  . pantoprazole  40 mg Oral Q12H  . predniSONE  10 mg Oral QAC breakfast  . sodium chloride  3 mL Intravenous Q12H  . tenofovir  300 mg Oral Daily  . tiotropium  18 mcg Inhalation Daily  . warfarin  7.5 mg Oral ONCE-1800    Assessment: Day #6 of Coumadin with Lovenox bridge for new Pulmonary Embolus.  INR dropping despite increased Coumadin dose.  No bleeding observed. Goal of Therapy:  INR 2-3   Plan:  Continue Lovenox 120mg  sq q 12hrs until INR therapeutic for minimum of 24 hours. Coumadin 7.5mg  po  today.  Serinity Ware, Elisha Headland, Pharm.D. 11/30/2010 9:36 AM

## 2010-11-30 NOTE — Progress Notes (Signed)
PT Cancellation Note:  Pt not seen by PT due to nausea.

## 2010-11-30 NOTE — Progress Notes (Signed)
Clinical Social Work-CSW notified pt of d/c. Pt first choice of SNF's did not have private room. Pt d/c to private room at [facility] with [facility] transportation and full d/c packet. No further needs-Abimael Zeiter,MSW, [phone]

## 2010-11-30 NOTE — Progress Notes (Signed)
Subjective: Con't to do well, ready for d/c today.  Some mild cough, but no SOB. Objective: Vital signs in last 24 hours: Filed Vitals:   11/29/10 1300 11/29/10 2057 11/29/10 2124 11/30/10 0512  BP: 122/76  133/82 96/63  Pulse: 93  84 94  Temp: 97.5 F (36.4 C)  98.8 F (37.1 C) 98.6 F (37 C)  TempSrc:   Oral Oral  Resp: 24  18 20   Height:      Weight:      SpO2: 93% 93% 92% 96%   Weight change:   Intake/Output Summary (Last 24 hours) at 11/30/10 1204 Last data filed at 11/29/10 1439  Gross per 24 hour  Intake      3 ml  Output      0 ml  Net      3 ml   Physical Exam: GEN: NAD. Alert and oriented x 3. Pleasant, conversant, and cooperative to exam.  RESP: Poor air movement in LL b/l, mild end expiratory wheezes in mid/lower lungs.  CARDIOVASCULAR: RRR, S1, S2, no m/r/g  ABDOMEN: soft, NT/ND, NABS  EXT: bandaged LLE  Lab Results: Basic Metabolic Panel:  Lab 11/27/10 1610 11/26/10 0500  NA 135 136  K 3.8 3.3*  CL 99 100  CO2 27 27  GLUCOSE 120* 93  BUN 15 15  CREATININE 0.97 1.12*  CALCIUM 8.8 8.5  MG -- --  PHOS -- --   CBC:  Lab 11/30/10 0700 11/27/10 0545  WBC 6.3 6.3  NEUTROABS -- --  HGB 11.1* 10.2*  HCT 34.1* 30.2*  MCV 96.1 93.8  PLT 197 156  CBG:  Lab 11/24/10 2157  GLUCAP 129*   Coagulation:  Lab 11/30/10 0700 11/29/10 0614 11/28/10 0500 11/27/10 0300  LABPROT 19.5* 21.0* 24.0* 22.9*  INR 1.62* 1.78* 2.11* 1.99*     Micro Results: Recent Results (from the past 240 hour(s))  MRSA PCR SCREENING     Status: Abnormal   Collection Time   11/24/10 10:28 AM      Component Value Range Status Comment   MRSA by PCR POSITIVE (*) NEGATIVE  Final    Studies/Results: No results found. Medications: I have reviewed the patient's current medications. Scheduled Meds:   . ALPRAZolam  1 mg Oral TID  . amLODipine  5 mg Oral Daily  . budesonide-formoterol  2 puff Inhalation BID  . Chlorhexidine Gluconate Cloth  6 each Topical Q0600  . docusate  sodium  100 mg Oral BID  . doxepin  150 mg Oral QHS  . enoxaparin (LOVENOX) injection  120 mg Subcutaneous Q12H  . entecavir  0.5 mg Oral Daily  . escitalopram  20 mg Oral Daily  . iloperidone  12 mg Oral Daily  . mupirocin   Nasal BID  . nicotine  21 mg Transdermal Daily  . pantoprazole  40 mg Oral Q12H  . predniSONE  10 mg Oral QAC breakfast  . sodium chloride  3 mL Intravenous Q12H  . tenofovir  300 mg Oral Daily  . tiotropium  18 mcg Inhalation Daily  . warfarin  7.5 mg Oral ONCE-1800  . warfarin  7.5 mg Oral ONCE-1800   Continuous Infusions:  PRN Meds:.acetaminophen, albuterol, morphine, oxyCODONE, traMADol Assessment/Plan: # Ankle fracture, left: POD #5 from ORIF. Pain has been controlled with PO meds. Pt will go to SNF for extended PT  - appreciate ortho rec's  - con't oxycodone  - to SNF today (non weight bearing x 4 weeks)  # Pulmonary embolism: She  is back on Lovenox and Coumadin. INR sub-therapeutic today.  - appreciate pharmacy rec's  - continue coumadin  - con't lovenox - have left careful d/c instructions for SNF after speaking with pharmD regarding anticoagulation at d/c.  # Acute exacerbation of chronic obstructive pulmonary disease (COPD): Breathing well today. Some wheezes on exam, likely at baseline.  - finish prednisone taper - con't IS   # ANEMIA, IRON DEFICIENCY: HgB stable.   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   # HTN: BP well controlled   # ANXIETY: Stable currently on her home medications .  # Acute on chronic renal failure: resolved with fluids. Currently at baseline.   #Dispo: to SNF today    LOS: 9 days   [facility], BEN 11/30/2010, 12:04 PM

## 2010-12-01 ENCOUNTER — Telehealth (HOSPITAL_COMMUNITY): Payer: Self-pay | Admitting: Emergency Medicine

## 2010-12-01 NOTE — ED Notes (Signed)
?   Staph Aureus -likely contaminant per Felicie Morn

## 2011-01-06 DIAGNOSIS — D481 Neoplasm of uncertain behavior of connective and other soft tissue: Secondary | ICD-10-CM | POA: Insufficient documentation

## 2011-04-16 ENCOUNTER — Emergency Department (HOSPITAL_COMMUNITY): Payer: Medicare Other

## 2011-04-16 ENCOUNTER — Encounter (HOSPITAL_COMMUNITY): Payer: Self-pay | Admitting: *Deleted

## 2011-04-16 ENCOUNTER — Other Ambulatory Visit: Payer: Self-pay

## 2011-04-16 ENCOUNTER — Emergency Department (HOSPITAL_COMMUNITY)
Admission: EM | Admit: 2011-04-16 | Discharge: 2011-04-17 | Disposition: A | Discharge status: Home / Self Care | Payer: Medicare Other | Attending: Emergency Medicine | Admitting: Emergency Medicine

## 2011-04-16 DIAGNOSIS — R079 Chest pain, unspecified: Secondary | ICD-10-CM | POA: Insufficient documentation

## 2011-04-16 DIAGNOSIS — J4489 Other specified chronic obstructive pulmonary disease: Secondary | ICD-10-CM | POA: Insufficient documentation

## 2011-04-16 DIAGNOSIS — R51 Headache: Secondary | ICD-10-CM

## 2011-04-16 DIAGNOSIS — I10 Essential (primary) hypertension: Secondary | ICD-10-CM | POA: Insufficient documentation

## 2011-04-16 DIAGNOSIS — F17200 Nicotine dependence, unspecified, uncomplicated: Secondary | ICD-10-CM | POA: Insufficient documentation

## 2011-04-16 DIAGNOSIS — J449 Chronic obstructive pulmonary disease, unspecified: Secondary | ICD-10-CM | POA: Insufficient documentation

## 2011-04-16 DIAGNOSIS — Z79899 Other long term (current) drug therapy: Secondary | ICD-10-CM | POA: Insufficient documentation

## 2011-04-16 HISTORY — DX: Other pulmonary embolism without acute cor pulmonale: I26.99

## 2011-04-16 LAB — CBC
MCH: 30.3 pg (ref 26.0–34.0)
MCV: 88.3 fL (ref 78.0–100.0)
Platelets: 123 10*3/uL — ABNORMAL LOW (ref 150–400)
RBC: 4.89 MIL/uL (ref 3.87–5.11)
RDW: 14.6 % (ref 11.5–15.5)

## 2011-04-16 LAB — POCT I-STAT, CHEM 8
HCT: 46 % (ref 36.0–46.0)
Hemoglobin: 15.6 g/dL — ABNORMAL HIGH (ref 12.0–15.0)
Potassium: 3.9 mEq/L (ref 3.5–5.1)
Sodium: 142 mEq/L (ref 135–145)
TCO2: 25 mmol/L (ref 0–100)

## 2011-04-16 LAB — DIFFERENTIAL
Basophils Absolute: 0 10*3/uL (ref 0.0–0.1)
Basophils Relative: 0 % (ref 0–1)
Eosinophils Absolute: 0.1 10*3/uL (ref 0.0–0.7)
Eosinophils Relative: 1 % (ref 0–5)
Lymphs Abs: 2.6 10*3/uL (ref 0.7–4.0)
Neutrophils Relative %: 59 % (ref 43–77)

## 2011-04-16 LAB — CARDIAC PANEL(CRET KIN+CKTOT+MB+TROPI)
Relative Index: INVALID (ref 0.0–2.5)
Total CK: 77 U/L (ref 7–177)
Troponin I: <0.3 ng/mL (ref <0.30)

## 2011-04-16 LAB — POCT I-STAT TROPONIN I: Troponin i, poc: 0 ng/mL (ref 0.00–0.08)

## 2011-04-16 LAB — PROTIME-INR: Prothrombin Time: 25 seconds — ABNORMAL HIGH (ref 11.6–15.2)

## 2011-04-16 MED ORDER — ONDANSETRON HCL 4 MG/2ML IJ SOLN
4.0000 mg | Freq: Once | INTRAMUSCULAR | Status: AC
  Administered 2011-04-16: 4 mg via INTRAVENOUS
  Filled 2011-04-16: qty 2

## 2011-04-16 MED ORDER — HYDROMORPHONE HCL PF 1 MG/ML IJ SOLN
1.0000 mg | Freq: Once | INTRAMUSCULAR | Status: AC
  Administered 2011-04-16: 1 mg via INTRAVENOUS
  Filled 2011-04-16: qty 1

## 2011-04-16 NOTE — ED Notes (Signed)
Pt is here with migraine HA for 3 days as wells as CP for several weeks. Pt is on coumadin for PE 10/2010

## 2011-04-16 NOTE — ED Notes (Signed)
Pt states she has had headache for 3 weeks that will not go away.  States the chest pain started 3 days ago.  Chest pain is centralized and seems to increase when she takes a deep breath or with pressure.  Headache not relieved by over the counter meds.  Pt alert and oriented X4. Pt reports history of PE in Oct after ankle surgery.  States the chest pain is similar to that of PE

## 2011-04-16 NOTE — ED Provider Notes (Signed)
Medical screening examination/treatment/procedure(s) were conducted as a shared visit with non-physician practitioner(s) and myself.  I personally evaluated the patient during the encounter  3-4 weeks gradual onset headache and vague CP no SOB/pleuritic or exertional component, pains present 24/7 for weeks not better with OTC meds, clinically do not suspect ACS or breakthrough PE on therapeutic Coumadin.  Hurman Horn, MD 04/17/11 2231

## 2011-04-16 NOTE — ED Notes (Signed)
Pt given diet coke. 

## 2011-04-17 ENCOUNTER — Emergency Department (HOSPITAL_COMMUNITY): Payer: Medicare Other

## 2011-04-17 ENCOUNTER — Ambulatory Visit: Payer: Medicare Other | Admitting: Gastroenterology

## 2011-04-17 MED ORDER — OXYCODONE-ACETAMINOPHEN 5-325 MG PO TABS: 1.0000 | ORAL_TABLET | ORAL | Status: AC | PRN

## 2011-04-17 NOTE — ED Provider Notes (Signed)
History     CSN: [id number]  Arrival date & time 04/16/11  2050   First MD Initiated Contact with Patient 04/16/11 2159      Chief Complaint  Patient presents with  . Migraine  . Chest Pain    (Consider location/radiation/quality/duration/timing/severity/associated sxs/prior treatment) HPI Comments: Patient here with complaints of migraine headache without nausea, vomiting but with photophobia - states that she has tried multiple OTC preparations without help - she also reports chronic chest pain - states that she was diagnosed with a PE in October and has periodic episodes of chest pain as well - she denies radiation of the pain, shortness of breath, nausea, vomiting, sweating, change in the pain with inspiration, fever or chills.  Patient is a 56 y.o. female presenting with migraine and chest pain. The history is provided by the patient. No language interpreter was used.  Migraine This is a new problem. The current episode started 1 to 4 weeks ago. The problem occurs constantly. The problem has been unchanged. Associated symptoms include chest pain and headaches. Pertinent negatives include no abdominal pain, anorexia, arthralgias, change in bowel habit, chills, congestion, coughing, diaphoresis, fatigue, fever, joint swelling, myalgias, nausea, neck pain, numbness, rash, sore throat, swollen glands, urinary symptoms, vertigo, visual change, vomiting or weakness. The symptoms are aggravated by nothing. She has tried nothing for the symptoms. The treatment provided no relief.  Chest Pain Pertinent negatives for primary symptoms include no fever, no fatigue, no cough, no abdominal pain, no nausea and no vomiting.  Pertinent negatives for associated symptoms include no diaphoresis, no numbness and no weakness.     Past Medical History  Diagnosis Date  . Hypertension   . COPD (chronic obstructive pulmonary disease)   . Renal insufficiency   . Cirrhosis   . Pulmonary emboli     Past  Surgical History  Procedure Date  . Abdominal hysterectomy     No family history on file.  History  Substance Use Topics  . Smoking status: Current Everyday Smoker -- 1.0 packs/day    Types: Cigarettes  . Smokeless tobacco: Not on file  . Alcohol Use: No     former drinker    OB History    Grav Para Term Preterm Abortions TAB SAB Ect Mult Living                  Review of Systems  Constitutional: Negative for fever, chills, diaphoresis and fatigue.  HENT: Negative for congestion, sore throat and neck pain.   Respiratory: Negative for cough.   Cardiovascular: Positive for chest pain.  Gastrointestinal: Negative for nausea, vomiting, abdominal pain, anorexia and change in bowel habit.  Musculoskeletal: Negative for myalgias, joint swelling and arthralgias.  Skin: Negative for rash.  Neurological: Positive for headaches. Negative for vertigo, weakness and numbness.  All other systems reviewed and are negative.    Allergies  Duloxetine; Gabapentin; and Triple antibiotic  Home Medications   Current Outpatient Rx  Name Route Sig Dispense Refill  . ALBUTEROL SULFATE HFA 108 (90 BASE) MCG/ACT IN AERS Inhalation Inhale 2 puffs into the lungs every 6 (six) hours as needed. For shortness of breath     . ALPRAZOLAM 1 MG PO TABS Oral Take 1 mg by mouth 3 (three) times daily as needed. For anxiety    . ASPIRIN EC 81 MG PO TBEC Oral Take 81 mg by mouth daily.      . BUDESONIDE-FORMOTEROL FUMARATE 160-4.5 MCG/ACT IN AERO Inhalation Inhale 2 puffs into  the lungs 2 (two) times daily.      Marland Kitchen CARISOPRODOL 350 MG PO TABS Oral Take 350-700 mg by mouth at bedtime as needed. For muscle spasms     . DOCUSATE SODIUM 100 MG PO CAPS Oral Take 100 mg by mouth 2 (two) times daily as needed. For constipation    . ENTECAVIR 0.5 MG PO TABS Oral Take 0.5 mg by mouth daily.      Marland Kitchen ESCITALOPRAM OXALATE 20 MG PO TABS Oral Take 20 mg by mouth daily.      Marland Kitchen ESTAZOLAM 2 MG PO TABS Oral Take 2 mg by mouth  at bedtime.      . ILOPERIDONE 6 MG PO TABS Oral Take 2 tablets by mouth every morning.      Marland Kitchen METOPROLOL SUCCINATE ER 25 MG PO TB24 Oral Take 25 mg by mouth daily.      Marland Kitchen PANTOPRAZOLE SODIUM 40 MG PO TBEC Oral Take 40 mg by mouth 2 (two) times daily.      Marland Kitchen RANITIDINE HCL 150 MG PO TABS Oral Take 150 mg by mouth 2 (two) times daily.    Marland Kitchen SPIRONOLACTONE 25 MG PO TABS Oral Take 25 mg by mouth 2 (two) times daily.      . TENOFOVIR DISOPROXIL FUMARATE 300 MG PO TABS Oral Take 300 mg by mouth daily.      Marland Kitchen TIOTROPIUM BROMIDE MONOHYDRATE 18 MCG IN CAPS Inhalation Place 18 mcg into inhaler and inhale daily.      Marland Kitchen VERAPAMIL HCL ER 180 MG PO TBCR Oral Take 360 mg by mouth daily.    . WARFARIN SODIUM 5 MG PO TABS Oral Take 5 mg by mouth daily. Take 5mg  on day 1, 7.5 on day 2, 5mg  on day, and continue as follows.      BP 112/83  Pulse 60  Temp(Src) 97.6 F (36.4 C) (Oral)  Resp 20  SpO2 97%  Physical Exam  Nursing note and vitals reviewed. Constitutional: She is oriented to person, place, and time. She appears well-developed and well-nourished. No distress.  HENT:  Head: Normocephalic and atraumatic.  Right Ear: External ear normal.  Left Ear: External ear normal.  Nose: Nose normal.  Mouth/Throat: Oropharynx is clear and moist. No oropharyngeal exudate.  Eyes: Conjunctivae are normal. Pupils are equal, round, and reactive to light. No scleral icterus.  Neck: Normal range of motion. Neck supple. No thyromegaly present.  Cardiovascular: Normal rate, regular rhythm and normal heart sounds.  Exam reveals no gallop and no friction rub.   No murmur heard.      Pulse noted to be 68 here  Pulmonary/Chest: Effort normal and breath sounds normal. No respiratory distress. She has no wheezes. She has no rales. She exhibits no tenderness.  Abdominal: Soft. Bowel sounds are normal. She exhibits no distension. There is no tenderness.  Musculoskeletal: Normal range of motion. She exhibits no edema and no  tenderness.  Lymphadenopathy:    She has no cervical adenopathy.  Neurological: She is alert and oriented to person, place, and time. No cranial nerve deficit.  Skin: Skin is warm and dry. No rash noted. No erythema. No pallor.  Psychiatric: She has a normal mood and affect. Her behavior is normal. Judgment and thought content normal.    ED Course  Procedures (including critical care time)  Labs Reviewed  PROTIME-INR - Abnormal; Notable for the following:    Prothrombin Time 25.0 (*)    INR 2.22 (*)    All other  components within normal limits  CBC - Abnormal; Notable for the following:    Platelets 123 (*)    All other components within normal limits  POCT I-STAT, CHEM 8 - Abnormal; Notable for the following:    Creatinine, Ser 1.60 (*)    Hemoglobin 15.6 (*)    All other components within normal limits  CARDIAC PANEL(CRET KIN+CKTOT+MB+TROPI)  DIFFERENTIAL  POCT I-STAT TROPONIN I  DIFFERENTIAL   Dg Chest 2 View  04/16/2011  *RADIOLOGY REPORT*  Clinical Data: 56 year old female with chest pain.  CHEST - 2 VIEW  Comparison: 11/21/2010  Findings: The cardiomediastinal silhouette is unremarkable. There is no evidence of focal airspace disease, pulmonary edema, suspicious pulmonary nodule/mass, pleural effusion, or pneumothorax. No acute bony abnormalities are identified.  IMPRESSION: No evidence of acute cardiopulmonary disease.  Original Report Authenticated By: Rosendo Gros, M.D.   Ct Head Wo Contrast  04/17/2011  *RADIOLOGY REPORT*  Clinical Data: Headache for 3 days; weakness and slurred speech.  CT HEAD WITHOUT CONTRAST  Technique:  Contiguous axial images were obtained from the base of the skull through the vertex without contrast.  Comparison: CT of the head performed 11/18/2010  Findings: There is no evidence of acute infarction, mass lesion, or intra- or extra-axial hemorrhage on CT.  Minimal periventricular and subcortical white matter change likely reflects small vessel  ischemic microangiopathy.  The posterior fossa, including the cerebellum, brainstem and fourth ventricle, is within normal limits.  The third and lateral ventricles, and basal ganglia are unremarkable in appearance.  The cerebral hemispheres are symmetric in appearance, with normal gray- white differentiation.  No mass effect or midline shift is seen.  There is no evidence of fracture; visualized osseous structures are unremarkable in appearance.  The visualized portions of the orbits are within normal limits.  The paranasal sinuses and mastoid air cells are well-aerated.  No significant soft tissue abnormalities are seen.  IMPRESSION:  1.  No acute intracranial pathology seen on CT. 2.  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  Original Report Authenticated By: Tonia Ghent, M.D.    Date: 04/17/2011  Rate: 66 Rhythm: normal sinus rhythm  QRS Axis: normal  Intervals: normal  ST/T Wave abnormalities: normal  Conduction Disutrbances:none  Narrative Interpretation: Reviewed by Dr. Fonnie Jarvis  Old EKG Reviewed: unchanged   Headache Chest pain    MDM  Patient with 3 week history of headache and chest pain - she is therapeutic on her coumadin and because of the length of time, I do not suspect PE, her EKG and cardiac enzymes were also normal.  CT scan of head is normal and I do not suspect intercranial abnormalities - she reports relief of chest pain and headache after the administration of dilaudid for pain - she would like to return home and follow up with her PCP in [facility] this week - she is requesting percocet for pain which we will give.        Izola Price [place], [place] 04/17/11 [phone]

## 2011-04-17 NOTE — Discharge Instructions (Signed)
Chest Pain (Nonspecific) It is often hard to give a specific diagnosis for the cause of chest pain. There is always a chance that your pain could be related to something serious, such as a heart attack or a blood clot in the lungs. You need to follow up with your caregiver for further evaluation. CAUSES   Heartburn.   Pneumonia or bronchitis.   Anxiety or stress.   Inflammation around your heart (pericarditis) or lung (pleuritis or pleurisy).   A blood clot in the lung.   A collapsed lung (pneumothorax). It can develop suddenly on its own (spontaneous pneumothorax) or from injury (trauma) to the chest.   Shingles infection (herpes zoster virus).  The chest wall is composed of bones, muscles, and cartilage. Any of these can be the source of the pain.  The bones can be bruised by injury.   The muscles or cartilage can be strained by coughing or overwork.   The cartilage can be affected by inflammation and become sore (costochondritis).  DIAGNOSIS  Lab tests or other studies, such as X-rays, electrocardiography, stress testing, or cardiac imaging, may be needed to find the cause of your pain.  TREATMENT   Treatment depends on what may be causing your chest pain. Treatment may include:   Acid blockers for heartburn.   Anti-inflammatory medicine.   Pain medicine for inflammatory conditions.   Antibiotics if an infection is present.   You may be advised to change lifestyle habits. This includes stopping smoking and avoiding alcohol, caffeine, and chocolate.   You may be advised to keep your head raised (elevated) when sleeping. This reduces the chance of acid going backward from your stomach into your esophagus.   Most of the time, nonspecific chest pain will improve within 2 to 3 days with rest and mild pain medicine.  HOME CARE INSTRUCTIONS   If antibiotics were prescribed, take your antibiotics as directed. Finish them even if you start to feel better.   For the next few  days, avoid physical activities that bring on chest pain. Continue physical activities as directed.   Do not smoke.   Avoid drinking alcohol.   Only take over-the-counter or prescription medicine for pain, discomfort, or fever as directed by your caregiver.   Follow your caregiver's suggestions for further testing if your chest pain does not go away.   Keep any follow-up appointments you made. If you do not go to an appointment, you could develop lasting (chronic) problems with pain. If there is any problem keeping an appointment, you must call to reschedule.  SEEK MEDICAL CARE IF:   You think you are having problems from the medicine you are taking. Read your medicine instructions carefully.   Your chest pain does not go away, even after treatment.   You develop a rash with blisters on your chest.  SEEK IMMEDIATE MEDICAL CARE IF:   You have increased chest pain or pain that spreads to your arm, neck, jaw, back, or abdomen.   You develop shortness of breath, an increasing cough, or you are coughing up blood.   You have severe back or abdominal pain, feel nauseous, or vomit.   You develop severe weakness, fainting, or chills.   You have a fever.  THIS IS AN EMERGENCY. Do not wait to see if the pain will go away. Get medical help at once. Call your local emergency services (911 in [place] Do not drive yourself to the hospital. MAKE SURE YOU:   Understand these instructions.     Will watch your condition.   Will get help right away if you are not doing well or get worse.  Document Released: 10/19/2004 Document Revised: 12/29/2010 Document Reviewed: 08/15/2007 [facility] Patient Information 2012 [place], [place].General Headache, Without Cause A general headache has no specific cause. These headaches are not life-threatening. They will not lead to other types of headaches. HOME CARE   Make and keep follow-up visits with your doctor.   Only take medicine as told by your doctor.    Try to relax, get a massage, or use your thoughts to control your body (biofeedback).   Apply cold or heat to the head and neck. Apply 3 or 4 times a day or as needed.  Finding out the results of your test Ask when your test results will be ready. Make sure you get your test results. GET HELP RIGHT AWAY IF:   You have problems with medicine.   Your medicine does not help relieve pain.   Your headache changes or becomes worse.   You feel sick to your stomach (nauseous) or throw up (vomit).   You have a temperature by mouth above 102 F (38.9 C), not controlled by medicine.   Your have a stiff neck.   You have vision loss.   You have muscle weakness.   You lose control of your muscles.   You lose balance or have trouble walking.   You feel like you are going to pass out (faint).  MAKE SURE YOU:   Understand these instructions.   Will watch this condition.   Will get help right away if you are not doing well or get worse.  Document Released: 10/19/2007 Document Revised: 12/29/2010 Document Reviewed: 10/19/2007 [facility] Patient Information 2012 [place], [place].

## 2011-04-23 ENCOUNTER — Encounter (HOSPITAL_COMMUNITY): Payer: Self-pay | Admitting: *Deleted

## 2011-04-23 ENCOUNTER — Other Ambulatory Visit: Payer: Self-pay

## 2011-04-23 ENCOUNTER — Emergency Department (HOSPITAL_COMMUNITY): Payer: Medicare Other

## 2011-04-23 ENCOUNTER — Emergency Department (HOSPITAL_COMMUNITY)
Admission: EM | Admit: 2011-04-23 | Discharge: 2011-04-24 | Disposition: A | Discharge status: Home / Self Care | Payer: Medicare Other | Attending: Emergency Medicine | Admitting: Emergency Medicine

## 2011-04-23 DIAGNOSIS — K7460 Unspecified cirrhosis of liver: Secondary | ICD-10-CM | POA: Insufficient documentation

## 2011-04-23 DIAGNOSIS — J449 Chronic obstructive pulmonary disease, unspecified: Secondary | ICD-10-CM | POA: Insufficient documentation

## 2011-04-23 DIAGNOSIS — R05 Cough: Secondary | ICD-10-CM | POA: Insufficient documentation

## 2011-04-23 DIAGNOSIS — R259 Unspecified abnormal involuntary movements: Secondary | ICD-10-CM | POA: Insufficient documentation

## 2011-04-23 DIAGNOSIS — R209 Unspecified disturbances of skin sensation: Secondary | ICD-10-CM | POA: Insufficient documentation

## 2011-04-23 DIAGNOSIS — I10 Essential (primary) hypertension: Secondary | ICD-10-CM | POA: Insufficient documentation

## 2011-04-23 DIAGNOSIS — Z86711 Personal history of pulmonary embolism: Secondary | ICD-10-CM | POA: Insufficient documentation

## 2011-04-23 DIAGNOSIS — J189 Pneumonia, unspecified organism: Secondary | ICD-10-CM | POA: Insufficient documentation

## 2011-04-23 DIAGNOSIS — R0609 Other forms of dyspnea: Secondary | ICD-10-CM | POA: Insufficient documentation

## 2011-04-23 DIAGNOSIS — Z79899 Other long term (current) drug therapy: Secondary | ICD-10-CM | POA: Insufficient documentation

## 2011-04-23 DIAGNOSIS — F17200 Nicotine dependence, unspecified, uncomplicated: Secondary | ICD-10-CM | POA: Insufficient documentation

## 2011-04-23 DIAGNOSIS — R51 Headache: Secondary | ICD-10-CM | POA: Insufficient documentation

## 2011-04-23 DIAGNOSIS — R0989 Other specified symptoms and signs involving the circulatory and respiratory systems: Secondary | ICD-10-CM | POA: Insufficient documentation

## 2011-04-23 DIAGNOSIS — R059 Cough, unspecified: Secondary | ICD-10-CM | POA: Insufficient documentation

## 2011-04-23 DIAGNOSIS — R079 Chest pain, unspecified: Secondary | ICD-10-CM | POA: Insufficient documentation

## 2011-04-23 DIAGNOSIS — R42 Dizziness and giddiness: Secondary | ICD-10-CM | POA: Insufficient documentation

## 2011-04-23 DIAGNOSIS — H538 Other visual disturbances: Secondary | ICD-10-CM | POA: Insufficient documentation

## 2011-04-23 DIAGNOSIS — J4489 Other specified chronic obstructive pulmonary disease: Secondary | ICD-10-CM | POA: Insufficient documentation

## 2011-04-23 LAB — AMMONIA: Ammonia: 45 umol/L (ref 11–60)

## 2011-04-23 LAB — COMPREHENSIVE METABOLIC PANEL
Alkaline Phosphatase: 113 U/L (ref 39–117)
BUN: 12 mg/dL (ref 6–23)
Chloride: 100 mEq/L (ref 96–112)
GFR calc Af Amer: 48 mL/min — ABNORMAL LOW (ref >90)
Glucose, Bld: 77 mg/dL (ref 70–99)
Potassium: 3.6 mEq/L (ref 3.5–5.1)
Total Bilirubin: 0.3 mg/dL (ref 0.3–1.2)

## 2011-04-23 LAB — CBC
Hemoglobin: 15.3 g/dL — ABNORMAL HIGH (ref 12.0–15.0)
RBC: 5.06 MIL/uL (ref 3.87–5.11)

## 2011-04-23 LAB — DIFFERENTIAL
Lymphs Abs: 2.3 10*3/uL (ref 0.7–4.0)
Monocytes Relative: 5 % (ref 3–12)
Neutro Abs: 4.2 10*3/uL (ref 1.7–7.7)
Neutrophils Relative %: 62 % (ref 43–77)

## 2011-04-23 MED ORDER — MORPHINE SULFATE 4 MG/ML IJ SOLN: 4.0000 mg | Freq: Once | INTRAMUSCULAR | Status: DC

## 2011-04-23 MED ORDER — SODIUM CHLORIDE 0.9 % IV BOLUS (SEPSIS)
1000.0000 mL | Freq: Once | INTRAVENOUS | Status: AC
  Administered 2011-04-23: 1000 mL via INTRAVENOUS

## 2011-04-23 MED ORDER — ALBUTEROL SULFATE (5 MG/ML) 0.5% IN NEBU
2.5000 mg | INHALATION_SOLUTION | Freq: Once | RESPIRATORY_TRACT | Status: AC
  Administered 2011-04-23: 2.5 mg via RESPIRATORY_TRACT
  Filled 2011-04-23: qty 0.5

## 2011-04-23 MED ORDER — IPRATROPIUM BROMIDE 0.02 % IN SOLN
0.5000 mg | Freq: Once | RESPIRATORY_TRACT | Status: AC
  Administered 2011-04-23: 0.5 mg via RESPIRATORY_TRACT
  Filled 2011-04-23: qty 2.5

## 2011-04-23 MED ORDER — MOXIFLOXACIN HCL 400 MG PO TABS
400.0000 mg | ORAL_TABLET | Freq: Once | ORAL | Status: AC
  Administered 2011-04-23: 400 mg via ORAL
  Filled 2011-04-23: qty 1

## 2011-04-23 MED ORDER — AZITHROMYCIN 250 MG PO TABS: ORAL_TABLET | ORAL | Status: AC

## 2011-04-23 MED ORDER — LORAZEPAM 2 MG/ML IJ SOLN
1.0000 mg | Freq: Once | INTRAMUSCULAR | Status: AC
  Administered 2011-04-23: 1 mg via INTRAVENOUS
  Filled 2011-04-23: qty 1

## 2011-04-23 MED ORDER — DIPHENHYDRAMINE HCL 50 MG/ML IJ SOLN
12.5000 mg | Freq: Once | INTRAMUSCULAR | Status: AC
  Administered 2011-04-23: 12.5 mg via INTRAVENOUS
  Filled 2011-04-23: qty 1

## 2011-04-23 MED ORDER — METOCLOPRAMIDE HCL 5 MG/ML IJ SOLN
10.0000 mg | Freq: Once | INTRAMUSCULAR | Status: AC
  Administered 2011-04-23: 10 mg via INTRAVENOUS
  Filled 2011-04-23: qty 2

## 2011-04-23 NOTE — Discharge Instructions (Signed)

## 2011-04-23 NOTE — ED Notes (Signed)
resp 40.  Hyperventilating and shaking all over.  She says for 2-3 hours.  Pt not answering questions

## 2011-04-23 NOTE — ED Provider Notes (Signed)
History     CSN: [id number]  Arrival date & time 04/23/11  1191   First MD Initiated Contact with Patient 04/23/11 2018      Chief Complaint  Patient presents with  . hyperventilating and shaking     (Consider location/radiation/quality/duration/timing/severity/associated sxs/prior treatment) Patient is a 56 y.o. female presenting with shortness of breath. The history is provided by the patient.  Shortness of Breath  The current episode started today. The problem occurs frequently. The problem has been unchanged. The problem is moderate. The symptoms are relieved by nothing. The symptoms are aggravated by nothing. Associated symptoms include cough and shortness of breath. Pertinent negatives include no chest pain, no fever, no rhinorrhea and no wheezing.    Past Medical History  Diagnosis Date  . Hypertension   . COPD (chronic obstructive pulmonary disease)   . Renal insufficiency   . Cirrhosis   . Pulmonary emboli     Past Surgical History  Procedure Date  . Abdominal hysterectomy     No family history on file.  History  Substance Use Topics  . Smoking status: Current Everyday Smoker -- 1.0 packs/day    Types: Cigarettes  . Smokeless tobacco: Not on file  . Alcohol Use: No     former drinker    OB History    Grav Para Term Preterm Abortions TAB SAB Ect Mult Living                  Review of Systems  Constitutional: Positive for chills. Negative for fever.  HENT: Negative for congestion and rhinorrhea.   Respiratory: Positive for cough and shortness of breath. Negative for wheezing.   Cardiovascular: Negative for chest pain and leg swelling.  Gastrointestinal: Negative for nausea, vomiting, abdominal pain and constipation.  Genitourinary: Negative for urgency, decreased urine volume and difficulty urinating.  Skin: Negative for wound.  Neurological: Positive for tremors and headaches.  Psychiatric/Behavioral: Negative for confusion.  All other systems  reviewed and are negative.    Allergies  Duloxetine; Gabapentin; and Triple antibiotic  Home Medications   Current Outpatient Rx  Name Route Sig Dispense Refill  . ALBUTEROL SULFATE HFA 108 (90 BASE) MCG/ACT IN AERS Inhalation Inhale 2 puffs into the lungs every 6 (six) hours as needed. For shortness of breath     . ALPRAZOLAM 1 MG PO TABS Oral Take 1 mg by mouth 3 (three) times daily as needed. For anxiety    . ASPIRIN EC 81 MG PO TBEC Oral Take 81 mg by mouth daily.      . BUDESONIDE-FORMOTEROL FUMARATE 160-4.5 MCG/ACT IN AERO Inhalation Inhale 2 puffs into the lungs 2 (two) times daily.      Marland Kitchen CARISOPRODOL 350 MG PO TABS Oral Take 350-700 mg by mouth at bedtime as needed. For muscle spasms     . DOCUSATE SODIUM 100 MG PO CAPS Oral Take 100 mg by mouth 2 (two) times daily as needed. For constipation    . ENTECAVIR 0.5 MG PO TABS Oral Take 0.5 mg by mouth daily.      Marland Kitchen ESCITALOPRAM OXALATE 20 MG PO TABS Oral Take 20 mg by mouth daily.      Marland Kitchen ESTAZOLAM 2 MG PO TABS Oral Take 2 mg by mouth at bedtime.      . ILOPERIDONE 6 MG PO TABS Oral Take 2 tablets by mouth every morning.      Marland Kitchen METOPROLOL SUCCINATE ER 25 MG PO TB24 Oral Take 25 mg by mouth daily.      Marland Kitchen  OXYCODONE-ACETAMINOPHEN 5-325 MG PO TABS Oral Take 1 tablet by mouth every 4 (four) hours as needed for pain. 15 tablet 0  . PANTOPRAZOLE SODIUM 40 MG PO TBEC Oral Take 40 mg by mouth 2 (two) times daily.      Marland Kitchen RANITIDINE HCL 150 MG PO TABS Oral Take 150 mg by mouth 2 (two) times daily.    Marland Kitchen SPIRONOLACTONE 25 MG PO TABS Oral Take 25 mg by mouth 2 (two) times daily.      . TENOFOVIR DISOPROXIL FUMARATE 300 MG PO TABS Oral Take 300 mg by mouth daily.      Marland Kitchen TIOTROPIUM BROMIDE MONOHYDRATE 18 MCG IN CAPS Inhalation Place 18 mcg into inhaler and inhale daily.      Marland Kitchen VERAPAMIL HCL ER 180 MG PO TBCR Oral Take 360 mg by mouth daily.    . WARFARIN SODIUM 5 MG PO TABS Oral Take 5 mg by mouth daily. Take 5mg  on day 1, 7.5 on day 2, 5mg  on day,  and continue as follows.      BP 109/83  Pulse 80  Temp(Src) 98.3 F (36.8 C) (Oral)  Resp 18  SpO2 95%  Physical Exam  Nursing note and vitals reviewed. Constitutional: She is oriented to person, place, and time. She appears well-developed and well-nourished. No distress.  HENT:  Head: Normocephalic and atraumatic.  Right Ear: External ear normal.  Left Ear: External ear normal.  Nose: Nose normal.  Mouth/Throat: Oropharynx is clear and moist.  Eyes: EOM are normal. Pupils are equal, round, and reactive to light.  Neck: Neck supple.  Cardiovascular: Normal rate, regular rhythm, normal heart sounds and intact distal pulses.   Pulmonary/Chest: Effort normal and breath sounds normal. No respiratory distress. She has no wheezes. She has no rales.  Abdominal: Soft. She exhibits no distension. There is no tenderness.  Musculoskeletal: She exhibits no edema.  Lymphadenopathy:    She has no cervical adenopathy.  Neurological: She is alert and oriented to person, place, and time. She has normal strength. She displays tremor (all 4 extremities). No cranial nerve deficit or sensory deficit. She exhibits normal muscle tone. She displays a negative Romberg sign. Coordination and gait normal. GCS eye subscore is 4. GCS verbal subscore is 5. GCS motor subscore is 6.  Skin: Skin is warm and dry. She is not diaphoretic. No pallor.    ED Course  Procedures (including critical care time)  Labs Reviewed  CBC - Abnormal; Notable for the following:    Hemoglobin 15.3 (*)    Platelets 116 (*) PLATELET COUNT CONFIRMED BY SMEAR   All other components within normal limits  COMPREHENSIVE METABOLIC PANEL - Abnormal; Notable for the following:    Creatinine, Ser 1.41 (*)    GFR calc non Af Amer 41 (*)    GFR calc Af Amer 48 (*)    All other components within normal limits  PROTIME-INR - Abnormal; Notable for the following:    Prothrombin Time 30.6 (*)    INR 2.88 (*)    All other components within  normal limits  LACTIC ACID, PLASMA - Abnormal; Notable for the following:    Lactic Acid, Venous 2.3 (*)    All other components within normal limits  DIFFERENTIAL  AMMONIA   Dg Chest 2 View  04/23/2011  *RADIOLOGY REPORT*  Clinical Data: Short of breath.  Chest pain.  Headache.  CHEST - 2 VIEW  Comparison: 04/16/2011.  Findings: Patchy left basilar density is present projecting over the left  atrium on the lateral view.  This is compatible with left lower lobe airspace disease or atelectasis.  Subsegmental atelectasis is present at the right lung base.  Cardiopericardial silhouette and mediastinal contours appear within normal limits. Tubing projects over the chest on the lateral view.  This is not seen on the frontal view.  This is probably external to the patient.  IMPRESSION: Patchy left lower lobe density suspicious for pneumonia. Atelectasis could produce a similar appearance.  Original Report Authenticated By: Andreas Newport, M.D.     1. Community acquired pneumonia       MDM  56 yo female presents with acute onset of dyspnea, shaking and headaches. Diffuse trembling noted, as well as tachypnea. No tachycardia or hypotension. PNA seen in [facility], likely PNA due to cough and tachypnea. CT head negative, and headache better with meds and headache cocktail. Given fluids in ED as well. Tremors resolved with xanax, and patient says she felt they were from chills. Has been on daily xanax until 10 days ago. Likely outside window for withdrawal, but will treat with a few days of outpatient xanax until patient can follow up with PCP. Stressed importance of not missing xanax doses. Discussed return precautions for CAP. Will treat with zpack.         Pricilla Loveless, MD 04/24/11 0020

## 2011-04-24 MED ORDER — ALPRAZOLAM 1 MG PO TABS: 1.0000 mg | ORAL_TABLET | Freq: Three times a day (TID) | ORAL | Status: DC | PRN

## 2011-04-30 NOTE — ED Provider Notes (Signed)
Evaluation and management procedures were performed by the PA/NP/Resident Physician under my supervision/collaboration.   Annitta Fifield D Clair Alfieri, MD 04/30/11 1601 

## 2011-05-15 ENCOUNTER — Other Ambulatory Visit (HOSPITAL_COMMUNITY): Payer: Self-pay | Admitting: Internal Medicine

## 2011-05-15 ENCOUNTER — Ambulatory Visit (HOSPITAL_COMMUNITY)
Admission: RE | Admit: 2011-05-15 | Discharge: 2011-05-15 | Disposition: A | Discharge status: Home / Self Care | Payer: Medicare Other | Source: Ambulatory Visit | Attending: Internal Medicine | Admitting: Internal Medicine

## 2011-05-15 DIAGNOSIS — J189 Pneumonia, unspecified organism: Secondary | ICD-10-CM

## 2011-05-15 DIAGNOSIS — R0602 Shortness of breath: Secondary | ICD-10-CM | POA: Insufficient documentation

## 2011-05-15 DIAGNOSIS — J984 Other disorders of lung: Secondary | ICD-10-CM | POA: Insufficient documentation

## 2011-05-15 DIAGNOSIS — R05 Cough: Secondary | ICD-10-CM | POA: Insufficient documentation

## 2011-05-15 DIAGNOSIS — R059 Cough, unspecified: Secondary | ICD-10-CM | POA: Insufficient documentation

## 2011-05-21 ENCOUNTER — Encounter (HOSPITAL_COMMUNITY): Payer: Self-pay | Admitting: Emergency Medicine

## 2011-05-21 ENCOUNTER — Emergency Department (HOSPITAL_COMMUNITY)
Admission: EM | Admit: 2011-05-21 | Discharge: 2011-05-21 | Disposition: A | Discharge status: Home / Self Care | Payer: Medicare Other | Attending: Emergency Medicine | Admitting: Emergency Medicine

## 2011-05-21 ENCOUNTER — Emergency Department (HOSPITAL_COMMUNITY): Payer: Medicare Other

## 2011-05-21 DIAGNOSIS — J449 Chronic obstructive pulmonary disease, unspecified: Secondary | ICD-10-CM | POA: Insufficient documentation

## 2011-05-21 DIAGNOSIS — R079 Chest pain, unspecified: Secondary | ICD-10-CM | POA: Insufficient documentation

## 2011-05-21 DIAGNOSIS — I10 Essential (primary) hypertension: Secondary | ICD-10-CM | POA: Insufficient documentation

## 2011-05-21 DIAGNOSIS — J4489 Other specified chronic obstructive pulmonary disease: Secondary | ICD-10-CM | POA: Insufficient documentation

## 2011-05-21 DIAGNOSIS — Z79899 Other long term (current) drug therapy: Secondary | ICD-10-CM | POA: Insufficient documentation

## 2011-05-21 DIAGNOSIS — R791 Abnormal coagulation profile: Secondary | ICD-10-CM

## 2011-05-21 DIAGNOSIS — R51 Headache: Secondary | ICD-10-CM | POA: Insufficient documentation

## 2011-05-21 DIAGNOSIS — R0602 Shortness of breath: Secondary | ICD-10-CM | POA: Insufficient documentation

## 2011-05-21 LAB — CBC
HCT: 40.2 % (ref 36.0–46.0)
MCHC: 33.6 g/dL (ref 30.0–36.0)
MCV: 90.1 fL (ref 78.0–100.0)
RDW: 15.8 % — ABNORMAL HIGH (ref 11.5–15.5)
WBC: 3.5 10*3/uL — ABNORMAL LOW (ref 4.0–10.5)

## 2011-05-21 LAB — RAPID URINE DRUG SCREEN, HOSP PERFORMED
Barbiturates: NOT DETECTED
Opiates: NOT DETECTED
Tetrahydrocannabinol: NOT DETECTED

## 2011-05-21 LAB — DIFFERENTIAL
Basophils Absolute: 0 10*3/uL (ref 0.0–0.1)
Eosinophils Relative: 1 % (ref 0–5)
Lymphocytes Relative: 48 % — ABNORMAL HIGH (ref 12–46)
Monocytes Absolute: 0.2 10*3/uL (ref 0.1–1.0)
Monocytes Relative: 5 % (ref 3–12)

## 2011-05-21 LAB — PROTIME-INR: INR: 1.57 — ABNORMAL HIGH (ref 0.00–1.49)

## 2011-05-21 LAB — POCT I-STAT TROPONIN I: Troponin i, poc: 0.01 ng/mL (ref 0.00–0.08)

## 2011-05-21 LAB — BASIC METABOLIC PANEL
BUN: 16 mg/dL (ref 6–23)
CO2: 26 mEq/L (ref 19–32)
Calcium: 9.3 mg/dL (ref 8.4–10.5)
Creatinine, Ser: 1.36 mg/dL — ABNORMAL HIGH (ref 0.50–1.10)

## 2011-05-21 LAB — ETHANOL: Alcohol, Ethyl (B): <11 mg/dL (ref 0–11)

## 2011-05-21 LAB — AMMONIA: Ammonia: 21 umol/L (ref 11–60)

## 2011-05-21 MED ORDER — ALBUTEROL SULFATE (5 MG/ML) 0.5% IN NEBU
INHALATION_SOLUTION | RESPIRATORY_TRACT | Status: AC
  Administered 2011-05-21: 08:00:00
  Filled 2011-05-21: qty 2

## 2011-05-21 MED ORDER — ASPIRIN 81 MG PO CHEW
324.0000 mg | CHEWABLE_TABLET | Freq: Once | ORAL | Status: DC
  Filled 2011-05-21: qty 4

## 2011-05-21 MED ORDER — SODIUM CHLORIDE 0.9 % IV BOLUS (SEPSIS)
1000.0000 mL | Freq: Once | INTRAVENOUS | Status: AC
  Administered 2011-05-21: 1000 mL via INTRAVENOUS

## 2011-05-21 MED ORDER — IPRATROPIUM BROMIDE 0.02 % IN SOLN
RESPIRATORY_TRACT | Status: AC
  Administered 2011-05-21: 08:00:00
  Filled 2011-05-21: qty 2.5

## 2011-05-21 MED ORDER — HYDROCODONE-ACETAMINOPHEN 5-325 MG PO TABS: 1.0000 | ORAL_TABLET | Freq: Four times a day (QID) | ORAL | Status: AC | PRN

## 2011-05-21 MED ORDER — HYDROMORPHONE HCL PF 1 MG/ML IJ SOLN
1.0000 mg | Freq: Once | INTRAMUSCULAR | Status: AC
  Administered 2011-05-21: 1 mg via INTRAVENOUS
  Filled 2011-05-21: qty 1

## 2011-05-21 NOTE — Discharge Instructions (Signed)
MRI scan was normal. Followup with your regular doctor about the persistent headaches. Return for new or worse symptoms.

## 2011-05-21 NOTE — ED Notes (Signed)
Patient transported to MRI 

## 2011-05-21 NOTE — ED Notes (Signed)
PT currently receiving breathing treatment

## 2011-05-21 NOTE — ED Provider Notes (Signed)
Patient has been cleared from any concern from the chest pain however the persistent headache will require evaluation with an MRI patient will be moved to CDU awaiting those study results.  Shelda Jakes, MD 05/21/11 [phone]

## 2011-05-21 NOTE — ED Notes (Signed)
Per EMS:  Pt was having left sided chest "tingling" radiating down left arm that started around an hour ago.  Pt was told that she had a possible blood clot in her lungs and started getting anxious.

## 2011-05-21 NOTE — ED Provider Notes (Signed)
History     CSN: [id number]  Arrival date & time 05/21/11  1308   First MD Initiated Contact with Patient 05/21/11 [phone]      Chief Complaint  Patient presents with  . Chest Pain    Patient is a 56 y.o. female presenting with chest pain. The history is provided by the patient.  Chest Pain The chest pain began more than 2 weeks ago. Chest pain occurs constantly. The chest pain is unchanged. Associated with: nothing. The severity of the pain is moderate. The quality of the pain is described as aching. Radiates to: left shoulder. Exacerbated by: nothing. Primary symptoms include shortness of breath and cough.   Pt reports she has had chest pain for "a long time" and she has it every day Tonight she had pain/numbness radiate to her left UE No focal weakness in the arm.   She also reports chronic headache No fever She also reports cough and SOB - but she has h/o COPD  Past Medical History  Diagnosis Date  . Hypertension   . COPD (chronic obstructive pulmonary disease)   . Renal insufficiency   . Cirrhosis   . Pulmonary emboli     Past Surgical History  Procedure Date  . Abdominal hysterectomy     History reviewed. No pertinent family history.  History  Substance Use Topics  . Smoking status: Current Everyday Smoker -- 1.0 packs/day    Types: Cigarettes  . Smokeless tobacco: Not on file  . Alcohol Use: No     former drinker    OB History    Grav Para Term Preterm Abortions TAB SAB Ect Mult Living                  Review of Systems  Respiratory: Positive for cough and shortness of breath.   Cardiovascular: Positive for chest pain.  All other systems reviewed and are negative.    Allergies  Duloxetine; Gabapentin; and Triple antibiotic  Home Medications   Current Outpatient Rx  Name Route Sig Dispense Refill  . ALBUTEROL SULFATE HFA 108 (90 BASE) MCG/ACT IN AERS Inhalation Inhale 2 puffs into the lungs every 6 (six) hours as needed. For shortness of breath      . ALPRAZOLAM 1 MG PO TABS Oral Take 1 mg by mouth 3 (three) times daily as needed. For anxiety    . ASPIRIN EC 81 MG PO TBEC Oral Take 81 mg by mouth daily.      . BUDESONIDE-FORMOTEROL FUMARATE 160-4.5 MCG/ACT IN AERO Inhalation Inhale 2 puffs into the lungs 2 (two) times daily.      Marland Kitchen CARISOPRODOL 350 MG PO TABS Oral Take 350-700 mg by mouth at bedtime as needed. For muscle spasms     . DOCUSATE SODIUM 100 MG PO CAPS Oral Take 100 mg by mouth 2 (two) times daily as needed. For constipation    . ENTECAVIR 0.5 MG PO TABS Oral Take 0.5 mg by mouth daily.      Marland Kitchen ESCITALOPRAM OXALATE 20 MG PO TABS Oral Take 40 mg by mouth daily.     Marland Kitchen ESZOPICLONE 3 MG PO TABS Oral Take 3 mg by mouth at bedtime. Take immediately before bedtime    . PANTOPRAZOLE SODIUM 40 MG PO TBEC Oral Take 40 mg by mouth 2 (two) times daily.      Marland Kitchen SPIRONOLACTONE 25 MG PO TABS Oral Take 25 mg by mouth 2 (two) times daily.      Marland Kitchen TEMAZEPAM 30 MG  PO CAPS Oral Take 30-60 mg by mouth at bedtime as needed. Takes every night    . TIOTROPIUM BROMIDE MONOHYDRATE 18 MCG IN CAPS Inhalation Place 18 mcg into inhaler and inhale daily.      Marland Kitchen VERAPAMIL HCL ER 180 MG PO TBCR Oral Take 360 mg by mouth daily.    . WARFARIN SODIUM 5 MG PO TABS Oral Take 5-7.5 mg by mouth See admin instructions. Take 1 tablet on Sunday,Monday,Wednesday,Friday and Saturday. Takes. 1.5 tablets (7.5mg ) on Tuesday and Thursday      BP 108/72  Pulse 60  Temp(Src) 97.7 F (36.5 C) (Oral)  Resp 18  SpO2 96% BP 106/64  Pulse 59  Temp(Src) 97.8 F (36.6 C) (Oral)  Resp 22  SpO2 100%   Physical Exam CONSTITUTIONAL: Well developed/well nourished HEAD AND FACE: Normocephalic/atraumatic EYES: EOMI/PERRL ENMT: Mucous membranes moist NECK: supple no meningeal signs SPINE:entire spine nontender CV: S1/S2 noted, no murmurs/rubs/gallops noted LUNGS: wheezes noted bilaterally but no distress, speaks me clearly ABDOMEN: soft, nontender, no rebound or  guarding GU:no cva tenderness NEURO: Pt is awake/alert, no arm/leg drift, facies symmetric EXTREMITIES: pulses normal, full ROM, no edema SKIN: warm, color normal PSYCH: no abnormalities of mood noted  ED Course  Procedures   Labs Reviewed  CBC - Abnormal; Notable for the following:    WBC 3.5 (*)    RDW 15.8 (*)    Platelets 91 (*) CONSISTENT WITH PREVIOUS RESULT   All other components within normal limits  DIFFERENTIAL - Abnormal; Notable for the following:    Neutro Abs 1.6 (*)    Lymphocytes Relative 48 (*)    All other components within normal limits  POCT I-STAT TROPONIN I  BASIC METABOLIC PANEL  URINE RAPID DRUG SCREEN (HOSP PERFORMED)  ETHANOL  AMMONIA  PROTIME-INR   6:40 AM Pt poor historian reports chronic HA and chronic chest pain but reports tonight the left UE became numb.   She was recently treated for pneumonia on a recent ER visit and had f/u CXR on 4/22 that showed improvement Also had recent CT head that was negative She does have h/o PE and is on coumadin 7:38 AM I spoke to patient further She reports her main concern is HA -she reports it is worsening and has had two recent CT head that were negative.  She now reports her leg UE is numb but no focal weakness. Given worsening HA with reported numbness to left UE will get MR imaging.  Pt woke up with numbness so would not be an interventional candidate.  She has no focal weakness It appear that CP is chronic and patient admits she had this for awhile and not changed.  No further workup  D/w dr Deretha Emory, will f/u on MR imaging.  If negative can be discharged home Will need to continue her coumadin and need to have dosage changed by her PCP  MDM  Nursing notes reviewed and considered in documentation Previous records reviewed and considered All labs/vitals reviewed and considered        Date: 05/21/2011  Rate: 58  Rhythm: normal sinus rhythm  QRS Axis: normal  Intervals: normal  ST/T Wave  abnormalities: normal  Conduction Disutrbances:none  Narrative Interpretation:   Old EKG Reviewed: unchanged Significant artifact noted   Joya Gaskins, MD 05/21/11 [phone]

## 2011-05-21 NOTE — ED Notes (Signed)
Pt received 2 neb treatments PTA given by EMS

## 2011-09-18 ENCOUNTER — Ambulatory Visit (INDEPENDENT_AMBULATORY_CARE_PROVIDER_SITE_OTHER): Payer: Medicare Other | Admitting: Gastroenterology

## 2011-09-18 DIAGNOSIS — R945 Abnormal results of liver function studies: Secondary | ICD-10-CM

## 2011-09-18 DIAGNOSIS — K7460 Unspecified cirrhosis of liver: Secondary | ICD-10-CM

## 2011-09-18 DIAGNOSIS — B181 Chronic viral hepatitis B without delta-agent: Secondary | ICD-10-CM

## 2011-10-08 ENCOUNTER — Encounter (HOSPITAL_COMMUNITY): Payer: Self-pay | Admitting: Emergency Medicine

## 2011-10-08 ENCOUNTER — Emergency Department (HOSPITAL_COMMUNITY)
Admission: EM | Admit: 2011-10-08 | Discharge: 2011-10-09 | Disposition: A | Discharge status: Home / Self Care | Payer: Medicare Other | Attending: Emergency Medicine | Admitting: Emergency Medicine

## 2011-10-08 DIAGNOSIS — F17200 Nicotine dependence, unspecified, uncomplicated: Secondary | ICD-10-CM | POA: Insufficient documentation

## 2011-10-08 DIAGNOSIS — R51 Headache: Secondary | ICD-10-CM

## 2011-10-08 DIAGNOSIS — N289 Disorder of kidney and ureter, unspecified: Secondary | ICD-10-CM | POA: Insufficient documentation

## 2011-10-08 DIAGNOSIS — Z7901 Long term (current) use of anticoagulants: Secondary | ICD-10-CM | POA: Insufficient documentation

## 2011-10-08 DIAGNOSIS — Z79899 Other long term (current) drug therapy: Secondary | ICD-10-CM | POA: Insufficient documentation

## 2011-10-08 DIAGNOSIS — I10 Essential (primary) hypertension: Secondary | ICD-10-CM | POA: Insufficient documentation

## 2011-10-08 DIAGNOSIS — J449 Chronic obstructive pulmonary disease, unspecified: Secondary | ICD-10-CM | POA: Insufficient documentation

## 2011-10-08 DIAGNOSIS — Z86711 Personal history of pulmonary embolism: Secondary | ICD-10-CM | POA: Insufficient documentation

## 2011-10-08 DIAGNOSIS — J4489 Other specified chronic obstructive pulmonary disease: Secondary | ICD-10-CM | POA: Insufficient documentation

## 2011-10-08 DIAGNOSIS — M545 Low back pain, unspecified: Secondary | ICD-10-CM | POA: Insufficient documentation

## 2011-10-08 NOTE — ED Notes (Signed)
Pt alert, arrives from home, c/o h/a, onset this evening, hx of migraine, resp even unlabored, skin pwd ambulates to triage

## 2011-10-09 LAB — URINALYSIS, ROUTINE W REFLEX MICROSCOPIC
Glucose, UA: NEGATIVE mg/dL
Ketones, ur: NEGATIVE mg/dL
Nitrite: NEGATIVE
Protein, ur: NEGATIVE mg/dL

## 2011-10-09 LAB — BASIC METABOLIC PANEL
BUN: 15 mg/dL (ref 6–23)
CO2: 25 mEq/L (ref 19–32)
Calcium: 9.1 mg/dL (ref 8.4–10.5)
Chloride: 101 mEq/L (ref 96–112)
Creatinine, Ser: 1.44 mg/dL — ABNORMAL HIGH (ref 0.50–1.10)
Glucose, Bld: 88 mg/dL (ref 70–99)

## 2011-10-09 LAB — URINE MICROSCOPIC-ADD ON

## 2011-10-09 MED ORDER — METOCLOPRAMIDE HCL 5 MG/ML IJ SOLN
10.0000 mg | Freq: Once | INTRAMUSCULAR | Status: AC
  Administered 2011-10-09: 10 mg via INTRAMUSCULAR
  Filled 2011-10-09: qty 2

## 2011-10-09 MED ORDER — DIPHENHYDRAMINE HCL 50 MG/ML IJ SOLN
25.0000 mg | Freq: Once | INTRAMUSCULAR | Status: AC
  Administered 2011-10-09: 25 mg via INTRAMUSCULAR
  Filled 2011-10-09: qty 1

## 2011-10-09 MED ORDER — OXYCODONE HCL 5 MG PO TABS: 5.0000 mg | ORAL_TABLET | ORAL | Status: AC | PRN

## 2011-10-09 NOTE — ED Provider Notes (Signed)
History     CSN: [id number]  Arrival date & time 10/08/11  2054   First MD Initiated Contact with Patient 10/09/11 0133      Chief Complaint  Patient presents with  . Headache    (Consider location/radiation/quality/duration/timing/severity/associated sxs/prior treatment) HPI 56 year old female presents to emergency room complaining of right lower flank pain, and headache. Headache started this evening, is typical with her normal headaches which she gets 2-3 times a month over the last year. Headache is left-sided. It is not associated with nausea or vomiting, photo or phonophobia. Patient reports she's had right flank pain over the last several weeks, worse over the last few days. Patient reports she has a history of kidney failure, and is concerned that she may use going into kidney failure again. She is also concerned she may have a kidney stone. She denies any prior history of kidney stones. Pain is sharp worse with lying flat. Pain is worse with palpation of the area. She denies any trauma or injury to the area. 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  Past Medical History  Diagnosis Date  . Hypertension   . COPD (chronic obstructive pulmonary disease)   . Renal insufficiency   . Cirrhosis   . Pulmonary emboli     Past Surgical History  Procedure Date  . Abdominal hysterectomy     No family history on file.  History  Substance Use Topics  . Smoking status: Current Every Day Smoker -- 1.0 packs/day    Types: Cigarettes  . Smokeless tobacco: Not on file  . Alcohol Use: No     former drinker    OB History    Grav Para Term Preterm Abortions TAB SAB Ect Mult Living                  Review of Systems  All other systems reviewed and are negative.    Allergies  Duloxetine; Gabapentin; and Neomycin-bacitracin zn-polymyx  Home Medications   Current Outpatient Rx  Name Route Sig Dispense Refill  . ALBUTEROL SULFATE HFA 108  (90 BASE) MCG/ACT IN AERS Inhalation Inhale 2 puffs into the lungs every 6 (six) hours as needed. For shortness of breath     . ALPRAZOLAM 1 MG PO TABS Oral Take 1 mg by mouth 3 (three) times daily as needed. For anxiety    . ASPIRIN EC 81 MG PO TBEC Oral Take 81 mg by mouth daily.      . BUDESONIDE-FORMOTEROL FUMARATE 160-4.5 MCG/ACT IN AERO Inhalation Inhale 2 puffs into the lungs 2 (two) times daily.      Marland Kitchen CARISOPRODOL 350 MG PO TABS Oral Take 350-700 mg by mouth at bedtime as needed. For muscle spasms     . ENTECAVIR 0.5 MG PO TABS Oral Take 0.5 mg by mouth daily.      Marland Kitchen ESCITALOPRAM OXALATE 20 MG PO TABS Oral Take 40 mg by mouth daily.     Marland Kitchen ESZOPICLONE 3 MG PO TABS Oral Take 3 mg by mouth at bedtime. Take immediately before bedtime    . PANTOPRAZOLE SODIUM 40 MG PO TBEC Oral Take 40 mg by mouth 2 (two) times daily.      Marland Kitchen SPIRONOLACTONE 25 MG PO TABS Oral Take 25 mg by mouth 2 (two) times daily.      Marland Kitchen TEMAZEPAM 30 MG PO CAPS Oral Take 30-60 mg by mouth at bedtime as needed. Takes every night    . TIOTROPIUM BROMIDE  MONOHYDRATE 18 MCG IN CAPS Inhalation Place 18 mcg into inhaler and inhale daily.      Marland Kitchen VERAPAMIL HCL ER 180 MG PO TBCR Oral Take 360 mg by mouth daily.    . WARFARIN SODIUM 5 MG PO TABS Oral Take 5-7.5 mg by mouth See admin instructions. Take 1 tablet on Sunday,Monday,Wednesday,Friday and Saturday. Takes. 1.5 tablets (7.5mg ) on Tuesday and Thursday    . OXYCODONE HCL 5 MG PO TABS Oral Take 1 tablet (5 mg total) by mouth every 4 (four) hours as needed for pain. 10 tablet 0    BP 96/69  Pulse 51  Temp 97.5 F (36.4 C) (Oral)  Resp 18  SpO2 96%  Physical Exam  Nursing note and vitals reviewed. Constitutional: She is oriented to person, place, and time. She appears well-developed and well-nourished.  HENT:  Head: Normocephalic and atraumatic.  Nose: Nose normal.  Mouth/Throat: Oropharynx is clear and moist.  Eyes: Conjunctivae normal and EOM are normal. Pupils are  equal, round, and reactive to light.  Neck: Normal range of motion. Neck supple. No JVD present. No tracheal deviation present. No thyromegaly present.  Cardiovascular: Normal rate, regular rhythm, normal heart sounds and intact distal pulses.  Exam reveals no gallop and no friction rub.   No murmur heard. Pulmonary/Chest: Effort normal and breath sounds normal. No stridor. No respiratory distress. She has no wheezes. She has no rales. She exhibits no tenderness.  Abdominal: Soft. Bowel sounds are normal. She exhibits no distension and no mass. There is no tenderness. There is no rebound and no guarding.  Musculoskeletal: Normal range of motion. She exhibits tenderness (Tenderness with palpation along right flank no overlying skin changes or spasm noted). She exhibits no edema.  Lymphadenopathy:    She has no cervical adenopathy.  Neurological: She is alert and oriented to person, place, and time. She has normal reflexes. No cranial nerve deficit. She exhibits normal muscle tone. Coordination normal.  Skin: Skin is warm and dry. No rash noted. No erythema. No pallor.  Psychiatric: She has a normal mood and affect. Her behavior is normal. Judgment and thought content normal.    ED Course  Procedures (including critical care time)  Labs Reviewed  URINALYSIS, ROUTINE W REFLEX MICROSCOPIC - Abnormal; Notable for the following:    APPearance CLOUDY (*)     Leukocytes, UA SMALL (*)     All other components within normal limits  URINE MICROSCOPIC-ADD ON - Abnormal; Notable for the following:    Squamous Epithelial / LPF FEW (*)     All other components within normal limits  PROTIME-INR - Abnormal; Notable for the following:    Prothrombin Time 20.1 (*)     INR 1.68 (*)     All other components within normal limits  BASIC METABOLIC PANEL - Abnormal; Notable for the following:    Potassium 3.3 (*)     Creatinine, Ser 1.44 (*)     GFR calc non Af Amer 40 (*)     GFR calc Af Amer 47 (*)      All other components within normal limits   No results found.   1. Low back pain   2. Renal insufficiency   3. Headache       MDM  56 year old female with headache and right flank pain. She is renal sufficiency but no overt signs of renal failure. Headache better after medications. Patient was informed that her INR was not therapeutic and she is to followup with her  primary care Dr.        Olivia Mackie, MD 10/09/11 [phone]

## 2011-10-09 NOTE — ED Notes (Signed)
Patient is alert and oriented x3.  She is complaining of a head ache that started At 7pm last night that is generalized all over her head with additional complaints  of flank pain that she states started  A week ago.  She denies any nausea, vomiting or diarrhea.

## 2011-10-24 DIAGNOSIS — N281 Cyst of kidney, acquired: Secondary | ICD-10-CM | POA: Insufficient documentation

## 2012-06-29 DIAGNOSIS — R300 Dysuria: Secondary | ICD-10-CM | POA: Insufficient documentation

## 2012-06-29 DIAGNOSIS — R209 Unspecified disturbances of skin sensation: Secondary | ICD-10-CM | POA: Insufficient documentation

## 2012-06-29 DIAGNOSIS — R609 Edema, unspecified: Secondary | ICD-10-CM | POA: Insufficient documentation

## 2012-06-29 DIAGNOSIS — R809 Proteinuria, unspecified: Secondary | ICD-10-CM | POA: Insufficient documentation

## 2012-06-29 DIAGNOSIS — F1210 Cannabis abuse, uncomplicated: Secondary | ICD-10-CM | POA: Insufficient documentation

## 2012-06-29 DIAGNOSIS — I2699 Other pulmonary embolism without acute cor pulmonale: Secondary | ICD-10-CM | POA: Insufficient documentation

## 2012-06-29 DIAGNOSIS — R229 Localized swelling, mass and lump, unspecified: Secondary | ICD-10-CM | POA: Insufficient documentation

## 2012-06-29 DIAGNOSIS — R32 Unspecified urinary incontinence: Secondary | ICD-10-CM | POA: Insufficient documentation

## 2012-06-29 DIAGNOSIS — K649 Unspecified hemorrhoids: Secondary | ICD-10-CM | POA: Insufficient documentation

## 2012-06-29 DIAGNOSIS — R0602 Shortness of breath: Secondary | ICD-10-CM | POA: Insufficient documentation

## 2012-06-29 DIAGNOSIS — K219 Gastro-esophageal reflux disease without esophagitis: Secondary | ICD-10-CM | POA: Insufficient documentation

## 2012-06-29 DIAGNOSIS — L680 Hirsutism: Secondary | ICD-10-CM | POA: Insufficient documentation

## 2012-06-29 DIAGNOSIS — J309 Allergic rhinitis, unspecified: Secondary | ICD-10-CM | POA: Insufficient documentation

## 2012-06-29 DIAGNOSIS — G8929 Other chronic pain: Secondary | ICD-10-CM | POA: Insufficient documentation

## 2012-06-29 DIAGNOSIS — G4700 Insomnia, unspecified: Secondary | ICD-10-CM | POA: Insufficient documentation

## 2012-12-21 ENCOUNTER — Emergency Department (HOSPITAL_COMMUNITY): Payer: Medicare Other

## 2012-12-21 ENCOUNTER — Encounter (HOSPITAL_COMMUNITY): Payer: Self-pay | Admitting: Emergency Medicine

## 2012-12-21 ENCOUNTER — Inpatient Hospital Stay (HOSPITAL_COMMUNITY)
Admission: EM | Admit: 2012-12-21 | Discharge: 2012-12-24 | DRG: 194 | Disposition: A | Discharge status: Home / Self Care | Payer: Medicare Other | Attending: Internal Medicine | Admitting: Internal Medicine

## 2012-12-21 DIAGNOSIS — K7460 Unspecified cirrhosis of liver: Secondary | ICD-10-CM

## 2012-12-21 DIAGNOSIS — R0902 Hypoxemia: Secondary | ICD-10-CM

## 2012-12-21 DIAGNOSIS — J449 Chronic obstructive pulmonary disease, unspecified: Secondary | ICD-10-CM | POA: Diagnosis present

## 2012-12-21 DIAGNOSIS — I10 Essential (primary) hypertension: Secondary | ICD-10-CM

## 2012-12-21 DIAGNOSIS — F411 Generalized anxiety disorder: Secondary | ICD-10-CM

## 2012-12-21 DIAGNOSIS — Z87891 Personal history of nicotine dependence: Secondary | ICD-10-CM

## 2012-12-21 DIAGNOSIS — I2510 Atherosclerotic heart disease of native coronary artery without angina pectoris: Secondary | ICD-10-CM

## 2012-12-21 DIAGNOSIS — R635 Abnormal weight gain: Secondary | ICD-10-CM

## 2012-12-21 DIAGNOSIS — R739 Hyperglycemia, unspecified: Secondary | ICD-10-CM | POA: Diagnosis present

## 2012-12-21 DIAGNOSIS — Z86711 Personal history of pulmonary embolism: Secondary | ICD-10-CM

## 2012-12-21 DIAGNOSIS — K5900 Constipation, unspecified: Secondary | ICD-10-CM

## 2012-12-21 DIAGNOSIS — N183 Chronic kidney disease, stage 3 unspecified: Secondary | ICD-10-CM

## 2012-12-21 DIAGNOSIS — I2699 Other pulmonary embolism without acute cor pulmonale: Secondary | ICD-10-CM

## 2012-12-21 DIAGNOSIS — T380X5A Adverse effect of glucocorticoids and synthetic analogues, initial encounter: Secondary | ICD-10-CM

## 2012-12-21 DIAGNOSIS — A419 Sepsis, unspecified organism: Secondary | ICD-10-CM

## 2012-12-21 DIAGNOSIS — J159 Unspecified bacterial pneumonia: Secondary | ICD-10-CM

## 2012-12-21 DIAGNOSIS — B1710 Acute hepatitis C without hepatic coma: Secondary | ICD-10-CM

## 2012-12-21 DIAGNOSIS — J189 Pneumonia, unspecified organism: Principal | ICD-10-CM

## 2012-12-21 DIAGNOSIS — N1832 Chronic kidney disease, stage 3b: Secondary | ICD-10-CM | POA: Diagnosis present

## 2012-12-21 DIAGNOSIS — Z79899 Other long term (current) drug therapy: Secondary | ICD-10-CM

## 2012-12-21 DIAGNOSIS — F1410 Cocaine abuse, uncomplicated: Secondary | ICD-10-CM

## 2012-12-21 DIAGNOSIS — B1910 Unspecified viral hepatitis B without hepatic coma: Secondary | ICD-10-CM

## 2012-12-21 DIAGNOSIS — R7309 Other abnormal glucose: Secondary | ICD-10-CM | POA: Diagnosis not present

## 2012-12-21 DIAGNOSIS — J4489 Other specified chronic obstructive pulmonary disease: Secondary | ICD-10-CM

## 2012-12-21 DIAGNOSIS — I129 Hypertensive chronic kidney disease with stage 1 through stage 4 chronic kidney disease, or unspecified chronic kidney disease: Secondary | ICD-10-CM | POA: Diagnosis present

## 2012-12-21 DIAGNOSIS — F17200 Nicotine dependence, unspecified, uncomplicated: Secondary | ICD-10-CM

## 2012-12-21 DIAGNOSIS — D509 Iron deficiency anemia, unspecified: Secondary | ICD-10-CM

## 2012-12-21 HISTORY — DX: Pneumonia, unspecified organism: J18.9

## 2012-12-21 LAB — CBC WITH DIFFERENTIAL/PLATELET
Basophils Relative: 0 % (ref 0–1)
Eosinophils Absolute: 0 10*3/uL (ref 0.0–0.7)
HCT: 41.1 % (ref 36.0–46.0)
Hemoglobin: 13.8 g/dL (ref 12.0–15.0)
Lymphs Abs: 0.9 10*3/uL (ref 0.7–4.0)
MCH: 30.9 pg (ref 26.0–34.0)
Monocytes Absolute: 0.3 10*3/uL (ref 0.1–1.0)
Monocytes Relative: 5 % (ref 3–12)
Neutro Abs: 4 10*3/uL (ref 1.7–7.7)
Neutrophils Relative %: 77 % (ref 43–77)
RBC: 4.46 MIL/uL (ref 3.87–5.11)

## 2012-12-21 LAB — COMPREHENSIVE METABOLIC PANEL
AST: 43 U/L — ABNORMAL HIGH (ref 0–37)
BUN: 16 mg/dL (ref 6–23)
CO2: 26 mEq/L (ref 19–32)
Calcium: 9.3 mg/dL (ref 8.4–10.5)
Chloride: 98 mEq/L (ref 96–112)
Creatinine, Ser: 1.21 mg/dL — ABNORMAL HIGH (ref 0.50–1.10)
GFR calc Af Amer: 57 mL/min — ABNORMAL LOW (ref >90)
GFR calc non Af Amer: 49 mL/min — ABNORMAL LOW (ref >90)
Glucose, Bld: 105 mg/dL — ABNORMAL HIGH (ref 70–99)
Total Bilirubin: 0.3 mg/dL (ref 0.3–1.2)

## 2012-12-21 LAB — URINALYSIS, ROUTINE W REFLEX MICROSCOPIC
Bilirubin Urine: NEGATIVE
Hgb urine dipstick: NEGATIVE
Leukocytes, UA: NEGATIVE
Nitrite: NEGATIVE
Specific Gravity, Urine: 1.014 (ref 1.005–1.030)
pH: 5.5 (ref 5.0–8.0)

## 2012-12-21 LAB — PRO B NATRIURETIC PEPTIDE: Pro B Natriuretic peptide (BNP): 149.3 pg/mL — ABNORMAL HIGH (ref 0–125)

## 2012-12-21 MED ORDER — PANTOPRAZOLE SODIUM 40 MG PO TBEC
40.0000 mg | DELAYED_RELEASE_TABLET | Freq: Every day | ORAL | Status: DC
  Administered 2012-12-21 – 2012-12-23 (×3): 40 mg via ORAL
  Filled 2012-12-21 (×2): qty 1

## 2012-12-21 MED ORDER — DEXTROSE 5 % IV SOLN
2.0000 g | Freq: Once | INTRAVENOUS | Status: AC
  Administered 2012-12-21: 2 g via INTRAVENOUS

## 2012-12-21 MED ORDER — VANCOMYCIN HCL 10 G IV SOLR
2000.0000 mg | Freq: Once | INTRAVENOUS | Status: AC
  Administered 2012-12-21: 2000 mg via INTRAVENOUS
  Filled 2012-12-21: qty 2000

## 2012-12-21 MED ORDER — HEPARIN SODIUM (PORCINE) 5000 UNIT/ML IJ SOLN
5000.0000 [IU] | Freq: Three times a day (TID) | INTRAMUSCULAR | Status: DC
  Administered 2012-12-21 – 2012-12-24 (×7): 5000 [IU] via SUBCUTANEOUS
  Filled 2012-12-21 (×11): qty 1

## 2012-12-21 MED ORDER — OSELTAMIVIR PHOSPHATE 75 MG PO CAPS
75.0000 mg | ORAL_CAPSULE | Freq: Two times a day (BID) | ORAL | Status: DC
  Administered 2012-12-21 – 2012-12-22 (×2): 75 mg via ORAL
  Filled 2012-12-21 (×3): qty 1

## 2012-12-21 MED ORDER — ALPRAZOLAM 0.5 MG PO TABS
1.0000 mg | ORAL_TABLET | Freq: Three times a day (TID) | ORAL | Status: DC | PRN
  Administered 2012-12-22 – 2012-12-24 (×6): 1 mg via ORAL
  Filled 2012-12-21 (×6): qty 2

## 2012-12-21 MED ORDER — ALBUTEROL SULFATE (5 MG/ML) 0.5% IN NEBU: 2.5000 mg | INHALATION_SOLUTION | RESPIRATORY_TRACT | Status: DC | PRN

## 2012-12-21 MED ORDER — ENTECAVIR 0.5 MG PO TABS
0.5000 mg | ORAL_TABLET | ORAL | Status: DC
  Administered 2012-12-22 – 2012-12-24 (×2): 0.5 mg via ORAL
  Filled 2012-12-21: qty 1

## 2012-12-21 MED ORDER — VANCOMYCIN HCL 10 G IV SOLR: 1500.0000 mg | INTRAVENOUS | Status: DC

## 2012-12-21 MED ORDER — OSELTAMIVIR PHOSPHATE 75 MG PO CAPS
75.0000 mg | ORAL_CAPSULE | Freq: Two times a day (BID) | ORAL | Status: DC
  Filled 2012-12-21: qty 1

## 2012-12-21 MED ORDER — TRAMADOL HCL 50 MG PO TABS
50.0000 mg | ORAL_TABLET | Freq: Two times a day (BID) | ORAL | Status: DC | PRN
  Administered 2012-12-21 – 2012-12-23 (×4): 50 mg via ORAL
  Filled 2012-12-21 (×4): qty 1

## 2012-12-21 MED ORDER — TIOTROPIUM BROMIDE MONOHYDRATE 18 MCG IN CAPS
18.0000 ug | ORAL_CAPSULE | Freq: Every day | RESPIRATORY_TRACT | Status: DC
  Administered 2012-12-23 – 2012-12-24 (×2): 18 ug via RESPIRATORY_TRACT
  Filled 2012-12-21 (×2): qty 5

## 2012-12-21 MED ORDER — TENOFOVIR DISOPROXIL FUMARATE 300 MG PO TABS
300.0000 mg | ORAL_TABLET | ORAL | Status: DC
  Administered 2012-12-22: 10:00:00 300 mg via ORAL
  Filled 2012-12-21 (×4): qty 1

## 2012-12-21 MED ORDER — TIOTROPIUM BROMIDE MONOHYDRATE 18 MCG IN CAPS: 18.0000 ug | ORAL_CAPSULE | Freq: Every day | RESPIRATORY_TRACT | Status: DC

## 2012-12-21 MED ORDER — METHYLPREDNISOLONE SODIUM SUCC 125 MG IJ SOLR
60.0000 mg | Freq: Two times a day (BID) | INTRAMUSCULAR | Status: DC
  Administered 2012-12-21 – 2012-12-22 (×2): 60 mg via INTRAVENOUS
  Filled 2012-12-21: qty 0.96
  Filled 2012-12-21: qty 2
  Filled 2012-12-21 (×3): qty 0.96

## 2012-12-21 MED ORDER — ACETAMINOPHEN 500 MG PO TABS
1000.0000 mg | ORAL_TABLET | Freq: Once | ORAL | Status: AC
  Administered 2012-12-21: 1000 mg via ORAL
  Filled 2012-12-21: qty 2

## 2012-12-21 MED ORDER — POLYETHYLENE GLYCOL 3350 17 G PO PACK
17.0000 g | PACK | Freq: Every day | ORAL | Status: DC | PRN
  Administered 2012-12-22 – 2012-12-23 (×2): 17 g via ORAL
  Filled 2012-12-21 (×3): qty 1

## 2012-12-21 MED ORDER — AMITRIPTYLINE HCL 50 MG PO TABS
50.0000 mg | ORAL_TABLET | Freq: Every day | ORAL | Status: DC
Start: 2012-12-21 — End: 2012-12-24
  Administered 2012-12-21 – 2012-12-23 (×3): 50 mg via ORAL
  Filled 2012-12-21 (×4): qty 1

## 2012-12-21 MED ORDER — IOHEXOL 350 MG/ML SOLN
100.0000 mL | Freq: Once | INTRAVENOUS | Status: AC | PRN
  Administered 2012-12-21: 100 mL via INTRAVENOUS

## 2012-12-21 MED ORDER — ILOPERIDONE 6 MG PO TABS
6.0000 mg | ORAL_TABLET | Freq: Every day | ORAL | Status: DC
  Administered 2012-12-22 – 2012-12-23 (×2): 6 mg via ORAL
  Filled 2012-12-21 (×4): qty 1

## 2012-12-21 MED ORDER — NICOTINE 14 MG/24HR TD PT24
14.0000 mg | MEDICATED_PATCH | Freq: Every day | TRANSDERMAL | Status: DC
  Administered 2012-12-22 – 2012-12-24 (×3): 14 mg via TRANSDERMAL
  Filled 2012-12-21 (×4): qty 1

## 2012-12-21 MED ORDER — SODIUM CHLORIDE 0.9 % IV BOLUS (SEPSIS)
1000.0000 mL | Freq: Once | INTRAVENOUS | Status: AC
  Administered 2012-12-21: 1000 mL via INTRAVENOUS

## 2012-12-21 MED ORDER — ACETAMINOPHEN 650 MG RE SUPP: 650.0000 mg | Freq: Four times a day (QID) | RECTAL | Status: DC | PRN

## 2012-12-21 MED ORDER — SODIUM CHLORIDE 0.9 % IV SOLN
INTRAVENOUS | Status: AC
  Administered 2012-12-21 – 2012-12-22 (×2): via INTRAVENOUS

## 2012-12-21 MED ORDER — ESCITALOPRAM OXALATE 20 MG PO TABS
40.0000 mg | ORAL_TABLET | Freq: Every day | ORAL | Status: DC
  Administered 2012-12-22 – 2012-12-24 (×3): 40 mg via ORAL
  Filled 2012-12-21 (×3): qty 2

## 2012-12-21 MED ORDER — ONDANSETRON HCL 4 MG PO TABS: 4.0000 mg | ORAL_TABLET | Freq: Four times a day (QID) | ORAL | Status: DC | PRN

## 2012-12-21 MED ORDER — BUDESONIDE-FORMOTEROL FUMARATE 160-4.5 MCG/ACT IN AERO
2.0000 | INHALATION_SPRAY | Freq: Two times a day (BID) | RESPIRATORY_TRACT | Status: DC
  Administered 2012-12-21 – 2012-12-24 (×6): 2 via RESPIRATORY_TRACT
  Filled 2012-12-21: qty 6

## 2012-12-21 MED ORDER — DEXTROSE 5 % IV SOLN
1.0000 g | Freq: Three times a day (TID) | INTRAVENOUS | Status: DC
  Filled 2012-12-21 (×2): qty 1

## 2012-12-21 MED ORDER — CEFEPIME HCL 1 G IJ SOLR
1.0000 g | Freq: Three times a day (TID) | INTRAMUSCULAR | Status: DC
  Administered 2012-12-22 – 2012-12-24 (×7): 1 g via INTRAVENOUS
  Filled 2012-12-21 (×11): qty 1

## 2012-12-21 MED ORDER — ONDANSETRON HCL 4 MG/2ML IJ SOLN: 4.0000 mg | Freq: Four times a day (QID) | INTRAMUSCULAR | Status: DC | PRN

## 2012-12-21 MED ORDER — ACETAMINOPHEN 325 MG PO TABS
650.0000 mg | ORAL_TABLET | Freq: Four times a day (QID) | ORAL | Status: DC | PRN
  Administered 2012-12-23: 650 mg via ORAL
  Filled 2012-12-21: qty 2

## 2012-12-21 MED ORDER — ENTECAVIR 0.5 MG PO TABS
0.5000 mg | ORAL_TABLET | ORAL | Status: DC
Start: 2012-12-21 — End: 2012-12-21
  Filled 2012-12-21: qty 1

## 2012-12-21 MED ORDER — ALBUTEROL SULFATE (5 MG/ML) 0.5% IN NEBU
2.5000 mg | INHALATION_SOLUTION | Freq: Four times a day (QID) | RESPIRATORY_TRACT | Status: DC
  Administered 2012-12-21 – 2012-12-23 (×7): 2.5 mg via RESPIRATORY_TRACT
  Filled 2012-12-21 (×9): qty 0.5

## 2012-12-21 NOTE — H&P (Signed)
[facility] History and Physical  [name] ZOX:[id number] DOB: Oct 12, 1955 DOA: 12/21/2012  Referring physician: Dr. Elesa Massed PCP: Alease Frame, PHD  Specialists: none  Chief Complaint: cough  HPI: [name] is a 57 y.o. female  57 year old with past medical history of hypertension hepatitis B  Currently on  antiretroviral therapy,tobacco abuse and quit in about 3 weeks ago recently discharged from [facility] on 11/12/2012 at that time treated with Levaquin for pneumonia,  comes in for cough and shortness of breath 2 days prior to admission. She also relates some chest pressure. She relates her cough and her shortness of breath has been progressively getting worse to the point when she short of breath while sitting down. She was nothing makes it better or worse. She relates pleuritic chest pain on her left side. She relates no sick contacts, no fevers at home.no known environmental allergens.  In the ED: Her temperature was 101.6 a basic metabolic panel was done that shows a creatinine of 1.2, lactic acid 2.7 a CBC shows a white count of 5.7 with a normal differential, CT image of the chest was done to rule out PE that showed bilateral pneumonitis. We are consulted for further evaluation.  Review of Systems: The patient denies anorexia, fever, weight loss,, vision loss, decreased hearing, hoarseness,  syncope,peripheral edema, balance deficits, hemoptysis, abdominal pain, melena, hematochezia, severe indigestion/heartburn, hematuria, incontinence, genital sores, muscle weakness, suspicious skin lesions, transient blindness, difficulty walking, depression, unusual weight change, abnormal bleeding, enlarged lymph nodes, angioedema, and breast masses.    Past Medical History  Diagnosis Date  . Hypertension   . COPD (chronic obstructive pulmonary disease)   . Renal insufficiency   . Cirrhosis   . Pulmonary emboli   . Pneumonia    Past Surgical History   Procedure Laterality Date  . Abdominal hysterectomy     Social History:  reports that she has quit smoking. Her smoking use included Cigarettes. She smoked 1.00 pack per day. She does not have any smokeless tobacco history on file. She reports that she does not drink alcohol or use illicit drugs. Was at home with daughter  Allergies  Allergen Reactions  . Duloxetine Other (See Comments)    REACTION: Headache  . Gabapentin     REACTION: rash in mouth  . Neomycin-Bacitracin Zn-Polymyx     REACTION: rash/hives    Family History  Problem Relation Age of Onset  . Stroke Mother   . Lung cancer Father     Prior to Admission medications   Medication Sig Start Date End Date Taking? Authorizing Provider  albuterol (PROVENTIL HFA;VENTOLIN HFA) 108 (90 BASE) MCG/ACT inhaler Inhale 2 puffs into the lungs every 6 (six) hours as needed. For shortness of breath    Yes Historical Provider, MD  ALPRAZolam Prudy Feeler) 1 MG tablet Take 1 mg by mouth 3 (three) times daily as needed. For anxiety   Yes Historical Provider, MD  amitriptyline (ELAVIL) 50 MG tablet Take 50 mg by mouth at bedtime.   Yes Historical Provider, MD  budesonide-formoterol (SYMBICORT) 160-4.5 MCG/ACT inhaler Inhale 2 puffs into the lungs 2 (two) times daily.     Yes Historical Provider, MD  entecavir (BARACLUDE) 0.5 MG tablet Take 0.5 mg by mouth every other day.    Yes Historical Provider, MD  escitalopram (LEXAPRO) 20 MG tablet Take 40 mg by mouth daily.    Yes Historical Provider, MD  Iloperidone (FANAPT) 6 MG TABS Take 6 mg by mouth daily.  Yes Historical Provider, MD  nicotine (NICODERM CQ - DOSED IN MG/24 HOURS) 14 mg/24hr patch Place 14 mg onto the skin daily.   Yes Historical Provider, MD  omeprazole (PRILOSEC) 40 MG capsule Take 40 mg by mouth daily.   Yes Historical Provider, MD  spironolactone (ALDACTONE) 25 MG tablet Take 25 mg by mouth 2 (two) times daily.     Yes Historical Provider, MD  tenofovir (VIREAD) 300 MG tablet  Take 300 mg by mouth every other day.   Yes Historical Provider, MD  tiotropium (SPIRIVA) 18 MCG inhalation capsule Place 18 mcg into inhaler and inhale daily.     Yes Historical Provider, MD  traMADol (ULTRAM) 50 MG tablet Take 50 mg by mouth 2 (two) times daily as needed for moderate pain.   Yes Historical Provider, MD   Physical Exam: Filed Vitals:   12/21/12 1809  BP: 119/46  Pulse: 102  Temp:   Resp: 34    BP 119/46  Pulse 102  Temp(Src) 101.6 F (38.7 C) (Rectal)  Resp 34  Wt 113.399 kg (250 lb)  SpO2 96%  General Appearance:    Alert, cooperative, no distress, appears stated age  Head:    Normocephalic, without obvious abnormality, atraumatic           Throat:   Lips, mucosa, and tongue dry  Neck:   Supple, symmetrical, trachea midline, no adenopathy;    thyroid:  no  JVD  Back:     Symmetric, no curvature, ROM normal, no CVA tenderness  Lungs:     moderate air movement with diffuse crackles bilaterally.       Heart:    Regular rate and rhythm, S1 and S2 normal, no murmur, rub   or gallop     Abdomen:     Soft, non-tender, bowel sounds active all four quadrants,    no masses, no organomegaly        Extremities:   Extremities normal, atraumatic, no cyanosis or edema  Pulses:   2+ and symmetric all extremities        Neurologic:   CNII-XII intact, normal strength, sensation and reflexes    throughout    Labs on Admission:  Basic Metabolic Panel:  Recent Labs Lab 12/21/12 1630  NA 137  K 4.1  CL 98  CO2 26  GLUCOSE 105*  BUN 16  CREATININE 1.21*  CALCIUM 9.3   Liver Function Tests:  Recent Labs Lab 12/21/12 1630  AST 43*  ALT 74*  ALKPHOS 120*  BILITOT 0.3  PROT 7.2  ALBUMIN 3.7   No results found for this basename: LIPASE, AMYLASE,  in the last 168 hours No results found for this basename: AMMONIA,  in the last 168 hours CBC:  Recent Labs Lab 12/21/12 1630  WBC 5.1  NEUTROABS 4.0  HGB 13.8  HCT 41.1  MCV 92.2  PLT 95*    Cardiac Enzymes: No results found for this basename: CKTOTAL, CKMB, CKMBINDEX, TROPONINI,  in the last 168 hours  BNP (last 3 results)  Recent Labs  12/21/12 1630  PROBNP 149.3*   CBG: No results found for this basename: GLUCAP,  in the last 168 hours  Radiological Exams on Admission: Ct Angio Chest Pe W/cm &/or Wo Cm  12/21/2012   CLINICAL DATA:  Chest pain.  Short of breath.  EXAM: CT ANGIOGRAPHY CHEST WITH CONTRAST  TECHNIQUE: Multidetector CT imaging of the chest was performed using the standard protocol during bolus administration of intravenous contrast. Multiplanar CT  image reconstructions including MIPs were obtained to evaluate the vascular anatomy.  CONTRAST:  OMNIPAQUE IOHEXOL 350 MG/ML SOLN  COMPARISON:  11/22/2010  FINDINGS: There are no filling defects in the pulmonary arterial tree to suggest acute pulmonary thromboembolism.  No evidence of aortic dissection. Maximal transverse diameter of the ascending aorta is 3.8 cm. This is stable. Great vessels are patent. Pulmonary artery is also mildly dilated at 3.5 cm.  No pericardial effusion.  No abnormal mediastinal adenopathy.  No pneumothorax or pleural effusion.  Scattered patchy densities in both lungs with a prep collection for the lower lobes is present.  No acute bony deformity.  Cirrhotic liver.  Borderline splenomegaly.  Review of the MIP images confirms the above findings.  IMPRESSION: No evidence of acute pulmonary thromboembolism.  Patchy bilateral pneumonitis with a prep collection for the lower lobes.   Electronically Signed   By: Maryclare Bean M.D.   On: 12/21/2012 18:05   Dg Chest Port 1 View  12/21/2012   CLINICAL DATA:  Shortness of breath.  Chest pain.  EXAM: PORTABLE CHEST - 1 VIEW  COMPARISON:  05/15/2011.  FINDINGS: Mediastinum and hilar structures are normal. Poor inspiration. Mild bibasilar atelectasis present. No pleural effusion or pneumothorax. Heart size and pulmonary vascularity normal.  IMPRESSION:  Poor inspiration with mild bibasilar atelectasis. Mild i infiltrates in the lung bases cannot be excluded.   Electronically Signed   By: Maisie Fus  Register   On: 12/21/2012 17:11    EKG: Independently reviewed. Sinus tachycardia left axis with nonspecific T-wave changes  Assessment/Plan Healthcare-associated pneumonia/  Sepsis/ COPD UNSPECIFIED: - Will continue Vancomycin and cefepime, add Tamiflu 11.29.2014 and will check for influenza. I will also check her HIV, she has a history of hepatitis B and is currently on retroviral therapy.  - We'll get sputum cultures and blood cultures.  - Also in the differential is presumptive diagnosis of hypersensitivity pneumonitis , as she was recently treated for pneumonia and she relates completing her treatment of Levaquin as an outpatient. Would go against hypersensitivity pneumonitis she doesn't have eosinophilia on her differential.  -Tylenol for fevers.   - She is also moving moderate air movement no wheezing at this time we'll start her on Solu-Medrol IV. Albuterol scheduled and when necessary also add Spiriva. Continue Symbicort home dose   HYPERTENSION: - We'll hold spironolactone.  Tobacco use disorder: - Continue nicotine patch.  CKD (chronic kidney disease), stage III: - At baseline continue to monitor.    Code Status: full Family Communication: daughter Disposition Plan: inpatient  Time spent: 80 minutes  Marinda Elk [facility] Pager [phone]  If 7PM-7AM, please contact night-coverage [email or web address] Password [facility] 12/21/2012, 6:56 PM

## 2012-12-21 NOTE — ED Notes (Signed)
Pt is here with chest pain, shortness of breath, and LE swelling.  Pt has dry mouth, labored breathing.  Pt recently got out of hospital 10/24 for pneumonia.

## 2012-12-21 NOTE — Progress Notes (Addendum)
ANTIBIOTIC CONSULT NOTE - INITIAL  Pharmacy Consult for Vancomycin, Cefepime Indication: pneumonia  Allergies  Allergen Reactions  . Duloxetine Other (See Comments)    REACTION: Headache  . Gabapentin     REACTION: rash in mouth  . Neomycin-Bacitracin Zn-Polymyx     REACTION: rash/hives    Patient Measurements: Weight: 250 lb (113.399 kg)  Vital Signs: Temp: 100.5 F (38.1 C) (11/29 1621) Temp src: Axillary (11/29 1621) BP: 140/67 mmHg (11/29 1621) Pulse Rate: 104 (11/29 1608) Intake/Output from previous day:   Intake/Output from this shift:    Labs: No results found for this basename: WBC, HGB, PLT, LABCREA, CREATININE,  in the last 72 hours The CrCl is unknown because both a height and weight (above a minimum accepted value) are required for this calculation. No results found for this basename: VANCOTROUGH, VANCOPEAK, VANCORANDOM, GENTTROUGH, GENTPEAK, GENTRANDOM, TOBRATROUGH, TOBRAPEAK, TOBRARND, AMIKACINPEAK, AMIKACINTROU, AMIKACIN,  in the last 72 hours   Microbiology: No results found for this or any previous visit (from the past 720 hour(s)).  Medical History: Past Medical History  Diagnosis Date  . Hypertension   . COPD (chronic obstructive pulmonary disease)   . Renal insufficiency   . Cirrhosis   . Pulmonary emboli   . Pneumonia     Medications:  Anti-infectives   None     Assessment: 57 year old female recently treated for pneumonia now presenting with chest pain and shortness of breath.  She is to begin empiric antibiotic therapy with Vancomycin and Cefepime for HCAP.  Note she has a history of renal dysfunction and her labs are still pending.  Goal of Therapy:  Vancomycin trough level 15-20 mcg/ml  Plan:  Cefepime 2gm IV x 1 now Vancomycin 2gm IV x 1 now Await lab results for further dosing  Estella Husk, Pharm.D., BCPS, AAHIVP Clinical Pharmacist Phone: [phone] or [phone] 12/21/2012, 4:43 PM  12/21/2012, 5:40 PM:   SCr 1.21, Est  CrCl (normalized) ~60 ml/min.  P: Vancomycin 1500mg  IV q24h Cefepime 1gm IV q8h  Sallee Provencal

## 2012-12-21 NOTE — Progress Notes (Signed)
Called and received report from RN.

## 2012-12-21 NOTE — ED Notes (Signed)
Pt transported to CT scan.

## 2012-12-21 NOTE — ED Provider Notes (Signed)
TIME SEEN: 4:20 PM  CHIEF COMPLAINT: Chest pain, shortness of breath  HPI: Patient is a 57 year old female with a history of hypertension, tobacco use who quit 3 weeks ago, recent hospitalization on 11/12/12 in [place] for pneumonia from a prior history of PE no longer on Coumadin who presents emergency department with 2 days of shortness of breath, dry cough and chest pressure that started today. She describes the chest pain is central without radiation. No aggravating or alleviating factors. She has had chills but no fever. No nausea, vomiting or diarrhea. She states this feels like her prior episodes of pneumonia as well as her prior episode of PE. She states she feels she has some swelling in her bilateral lower extremities. No history of CHF.  No recent surgery, fracture, trauma, she is not on exogenous estrogens.    ROS: See HPI Constitutional: no fever  Eyes: no drainage  ENT: no runny nose   Cardiovascular:   chest pain  Resp:  SOB  GI: no vomiting GU: no dysuria Integumentary: no rash  Allergy: no hives  Musculoskeletal:  leg swelling  Neurological: no slurred speech ROS otherwise negative  PAST MEDICAL HISTORY/PAST SURGICAL HISTORY:  Past Medical History  Diagnosis Date  . Hypertension   . COPD (chronic obstructive pulmonary disease)   . Renal insufficiency   . Cirrhosis   . Pulmonary emboli   . Pneumonia     MEDICATIONS:  Prior to Admission medications   Medication Sig Start Date End Date Taking? Authorizing Provider  albuterol (PROVENTIL HFA;VENTOLIN HFA) 108 (90 BASE) MCG/ACT inhaler Inhale 2 puffs into the lungs every 6 (six) hours as needed. For shortness of breath     Historical Provider, MD  ALPRAZolam Prudy Feeler) 1 MG tablet Take 1 mg by mouth 3 (three) times daily as needed. For anxiety    Historical Provider, MD  aspirin EC 81 MG tablet Take 81 mg by mouth daily.      Historical Provider, MD  budesonide-formoterol (SYMBICORT) 160-4.5 MCG/ACT  inhaler Inhale 2 puffs into the lungs 2 (two) times daily.      Historical Provider, MD  carisoprodol (SOMA) 350 MG tablet Take 350-700 mg by mouth at bedtime as needed. For muscle spasms     Historical Provider, MD  entecavir (BARACLUDE) 0.5 MG tablet Take 0.5 mg by mouth daily.      Historical Provider, MD  escitalopram (LEXAPRO) 20 MG tablet Take 40 mg by mouth daily.     Historical Provider, MD  Eszopiclone 3 MG TABS Take 3 mg by mouth at bedtime. Take immediately before bedtime    Historical Provider, MD  pantoprazole (PROTONIX) 40 MG tablet Take 40 mg by mouth 2 (two) times daily.      Historical Provider, MD  spironolactone (ALDACTONE) 25 MG tablet Take 25 mg by mouth 2 (two) times daily.      Historical Provider, MD  temazepam (RESTORIL) 30 MG capsule Take 30-60 mg by mouth at bedtime as needed. Takes every night    Historical Provider, MD  tiotropium (SPIRIVA) 18 MCG inhalation capsule Place 18 mcg into inhaler and inhale daily.      Historical Provider, MD  verapamil (CALAN-SR) 180 MG CR tablet Take 360 mg by mouth daily.    Historical Provider, MD  warfarin (COUMADIN) 5 MG tablet Take 5-7.5 mg by mouth See admin instructions. Take 1 tablet on Sunday,Monday,Wednesday,Friday and Saturday. Takes. 1.5 tablets (7.5mg ) on Tuesday and Thursday    Historical Provider, MD  ALLERGIES:  Allergies  Allergen Reactions  . Duloxetine Other (See Comments)    REACTION: Headache  . Gabapentin     REACTION: rash in mouth  . Neomycin-Bacitracin Zn-Polymyx     REACTION: rash/hives    SOCIAL HISTORY:  History  Substance Use Topics  . Smoking status: Former Smoker -- 1.00 packs/day    Types: Cigarettes  . Smokeless tobacco: Not on file  . Alcohol Use: No     Comment: former drinker    FAMILY HISTORY: No family history on file.  EXAM: BP 153/97  Pulse 104  Temp(Src) 97.4 F (36.3 C) (Oral)  Resp 32  Wt 250 lb (113.399 kg)  SpO2 97% CONSTITUTIONAL: Alert and oriented and responds  appropriately to questions. Well-appearing; well-nourished HEAD: Normocephalic EYES: Conjunctivae clear, PERRL ENT: normal nose; no rhinorrhea; moist mucous membranes; pharynx without lesions noted NECK: Supple, no meningismus, no LAD  CARD: Tachycardic; S1 and S2 appreciated; no murmurs, no clicks, no rubs, no gallops RESP: Normal chest excursion without splinting; patient is tachypneic and speaking short sentences, equal breath sounds bilaterally the patient does have diffuse rhonchi and mild expiratory wheeze, no rales; oxygen saturation in the low 90s or an air at rest ABD/GI: Normal bowel sounds; non-distended; soft, non-tender, no rebound, no guarding BACK:  The back appears normal and is non-tender to palpation, there is no CVA tenderness EXT: Normal ROM in all joints; non-tender to palpation; no edema; normal capillary refill; no cyanosis    SKIN: Normal color for age and race; warm NEURO: Moves all extremities equally PSYCH: The patient's mood and manner are appropriate. Grooming and personal hygiene are appropriate.  MEDICAL DECISION MAKING: Pt here with CP, SOB, cough, chills.  Concern for PE versus PNA.  Will obtain labs, cultures, CXR.  Will give empiric abx for health care associated PNA given her hospitalization one month ago.  Will also obtain CT of pt's chest to rule out PE.  Pt has new O2 requirement.  Pt will need admission.  ED PROGRESS: Patient has no leukocytosis. Her lactate is elevated at 2.7. Troponin negative. Patient's chest x-ray shows mild by basilar infiltrates. Her CT scan shows no pulmonary embolus. Given her tachycardia, tachypnea, no oxygen requirement, will admit for health care associated pneumonia. Will discuss with hospitalist.    EKG Interpretation    Date/Time:  Saturday December 21 2012 16:10:09 EST Ventricular Rate:  105 PR Interval:  132 QRS Duration: 88 QT Interval:  346 QTC Calculation: 457 R Axis:   -8 Text Interpretation:  Sinus tachycardia  Otherwise normal ECG Confirmed by Loel Betancur  DO, Mell Guia ([id number]) on 12/21/2012 4:19:49 PM             Layla Maw Cherrell Maybee, DO 12/21/12 1835

## 2012-12-21 NOTE — ED Notes (Signed)
Patient returned from CT

## 2012-12-22 DIAGNOSIS — J189 Pneumonia, unspecified organism: Principal | ICD-10-CM

## 2012-12-22 DIAGNOSIS — B1910 Unspecified viral hepatitis B without hepatic coma: Secondary | ICD-10-CM

## 2012-12-22 DIAGNOSIS — J449 Chronic obstructive pulmonary disease, unspecified: Secondary | ICD-10-CM

## 2012-12-22 DIAGNOSIS — R739 Hyperglycemia, unspecified: Secondary | ICD-10-CM | POA: Diagnosis present

## 2012-12-22 LAB — COMPREHENSIVE METABOLIC PANEL
ALT: 72 U/L — ABNORMAL HIGH (ref 0–35)
AST: 43 U/L — ABNORMAL HIGH (ref 0–37)
Alkaline Phosphatase: 102 U/L (ref 39–117)
BUN: 16 mg/dL (ref 6–23)
CO2: 23 mEq/L (ref 19–32)
Calcium: 8.9 mg/dL (ref 8.4–10.5)
Chloride: 100 mEq/L (ref 96–112)
GFR calc non Af Amer: 54 mL/min — ABNORMAL LOW (ref >90)
Potassium: 3.9 mEq/L (ref 3.5–5.1)
Sodium: 136 mEq/L (ref 135–145)
Total Bilirubin: 0.3 mg/dL (ref 0.3–1.2)
Total Protein: 6.6 g/dL (ref 6.0–8.3)

## 2012-12-22 LAB — HIV ANTIBODY (ROUTINE TESTING W REFLEX): HIV: NONREACTIVE

## 2012-12-22 LAB — INFLUENZA PANEL BY PCR (TYPE A & B)
H1N1 flu by pcr: NOT DETECTED
Influenza A By PCR: NEGATIVE

## 2012-12-22 LAB — LEGIONELLA ANTIGEN, URINE

## 2012-12-22 LAB — URINE CULTURE
Colony Count: NO GROWTH
Culture: NO GROWTH

## 2012-12-22 LAB — CBC
MCH: 30.7 pg (ref 26.0–34.0)
Platelets: 92 10*3/uL — ABNORMAL LOW (ref 150–400)
RBC: 4.2 MIL/uL (ref 3.87–5.11)
WBC: 11 10*3/uL — ABNORMAL HIGH (ref 4.0–10.5)

## 2012-12-22 LAB — GLUCOSE, CAPILLARY
Glucose-Capillary: 148 mg/dL — ABNORMAL HIGH (ref 70–99)
Glucose-Capillary: 237 mg/dL — ABNORMAL HIGH (ref 70–99)

## 2012-12-22 MED ORDER — PREDNISONE 20 MG PO TABS
40.0000 mg | ORAL_TABLET | Freq: Two times a day (BID) | ORAL | Status: DC
  Administered 2012-12-22 – 2012-12-23 (×3): 40 mg via ORAL
  Filled 2012-12-22 (×5): qty 2

## 2012-12-22 MED ORDER — VANCOMYCIN HCL IN DEXTROSE 750-5 MG/150ML-% IV SOLN
750.0000 mg | Freq: Two times a day (BID) | INTRAVENOUS | Status: AC
  Administered 2012-12-22 – 2012-12-23 (×3): 750 mg via INTRAVENOUS
  Filled 2012-12-22 (×4): qty 150

## 2012-12-22 MED ORDER — INSULIN ASPART 100 UNIT/ML ~~LOC~~ SOLN
0.0000 [IU] | Freq: Three times a day (TID) | SUBCUTANEOUS | Status: DC
  Administered 2012-12-22 – 2012-12-23 (×2): 2 [IU] via SUBCUTANEOUS
  Administered 2012-12-23: 12:00:00 5 [IU] via SUBCUTANEOUS
  Administered 2012-12-23: 08:00:00 3 [IU] via SUBCUTANEOUS

## 2012-12-22 MED ORDER — DM-GUAIFENESIN ER 30-600 MG PO TB12
1.0000 | ORAL_TABLET | Freq: Two times a day (BID) | ORAL | Status: DC
  Administered 2012-12-22 – 2012-12-24 (×5): 1 via ORAL
  Filled 2012-12-22 (×6): qty 1

## 2012-12-22 NOTE — Progress Notes (Signed)
[name] [id number] Admitted to 5w07: 12/22/2012 2:32 AM Attending Provider: Marinda Elk, MD    [name] is a 57 y.o. female patient admitted from ED awake, alert  & orientated  X 3,  Full Code, VSS - Blood pressure 127/86, pulse 108, temperature 97.8 F (36.6 C), temperature source Oral, resp. rate 22, height 5' 4.8" (1.646 m), weight 105.96 kg (233 lb 9.6 oz), SpO2 96.00%., O2    2LPM L nasal cannular, no c/o shortness of breath, no c/o chest pain, no distress noted.   IV site WDL:  antecubital right, condition patent and no redness with a transparent dsg that's clean dry and intact.  Allergies:   Allergies  Allergen Reactions  . Duloxetine Other (See Comments)    REACTION: Headache  . Gabapentin     REACTION: rash in mouth  . Neomycin-Bacitracin Zn-Polymyx     REACTION: rash/hives     Past Medical History  Diagnosis Date  . Hypertension   . COPD (chronic obstructive pulmonary disease)   . Renal insufficiency   . Cirrhosis   . Pulmonary emboli   . Pneumonia     History:  obtained from the patient.  Pt orientation to unit, room and routine. Information packet given to patient/family and safety video watched.  Admission INP armband ID verified with patient/family, and in place. SR up x 2, fall risk assessment complete with Patient and family verbalizing understanding of risks associated with falls. Pt verbalizes an understanding of how to use the call bell and to call for help before getting out of bed.  Skin, clean-dry- intact without evidence of bruising, or skin tears.   No evidence of skin break down noted on exam.    Will cont to monitor and assist as needed.  Tessie Eke, RN 12/22/2012 2:32 AM

## 2012-12-22 NOTE — Progress Notes (Signed)
Utilization Review Completed.Lord Lancour T11/30/2014  

## 2012-12-22 NOTE — Progress Notes (Signed)
Patient slept well through night on 2 LPM oxygen via nasal cannula.

## 2012-12-22 NOTE — Progress Notes (Signed)
Chart reviewed.  Subjective: Feels a lot of chest congestion. Cough is nonproductive. Still feels short of breath. Requesting a shower. Appetite good.  Objective: Vital signs in last 24 hours: Temp:  [97.4 F (36.3 C)-101.6 F (38.7 C)] 97.6 F (36.4 C) (11/30 0513) Pulse Rate:  [66-115] 66 (11/30 0513) Resp:  [20-34] 20 (11/30 0513) BP: (119-153)/(46-97) 144/90 mmHg (11/30 0513) SpO2:  [94 %-97 %] 95 % (11/30 0736) Weight:  [93.622 kg (206 lb 6.4 oz)-113.399 kg (250 lb)] 93.622 kg (206 lb 6.4 oz) (11/30 0610) Weight change:     Intake/Output from previous day: 11/29 0701 - 11/30 0700 In: 1225 [I.V.:1125; IV Piggyback:100] Out: 1650 [Urine:1650] Intake/Output this shift: Total I/O In: 240 [P.O.:240] Out: 900 [Urine:900]  General: Comfortable talking on the phone. Cooperative. Nontoxic appearing. Lungs: Slight congestion without significant wheezes rhonchi or rales. Good air movement. Overall, diminished Cardiovascular regular rate rhythm without murmurs gallops rubs Abdomen obese soft nontender nondistended Extremities no clubbing cyanosis or edema.   Lab Results:  Recent Labs  12/21/12 1630 12/22/12 0540  WBC 5.1 11.0*  HGB 13.8 12.9  HCT 41.1 38.8  PLT 95* 92*   BMET  Recent Labs  12/21/12 1630 12/22/12 0540  NA 137 136  K 4.1 3.9  CL 98 100  CO2 26 23  GLUCOSE 105* 225*  BUN 16 16  CREATININE 1.21* 1.11*  CALCIUM 9.3 8.9    Studies/Results: Ct Angio Chest Pe W/cm &/or Wo Cm  12/21/2012   CLINICAL DATA:  Chest pain.  Short of breath.  EXAM: CT ANGIOGRAPHY CHEST WITH CONTRAST  TECHNIQUE: Multidetector CT imaging of the chest was performed using the standard protocol during bolus administration of intravenous contrast. Multiplanar CT image reconstructions including MIPs were obtained to evaluate the vascular anatomy.  CONTRAST:  OMNIPAQUE IOHEXOL 350 MG/ML SOLN  COMPARISON:  11/22/2010  FINDINGS: There are no filling defects in the pulmonary  arterial tree to suggest acute pulmonary thromboembolism.  No evidence of aortic dissection. Maximal transverse diameter of the ascending aorta is 3.8 cm. This is stable. Great vessels are patent. Pulmonary artery is also mildly dilated at 3.5 cm.  No pericardial effusion.  No abnormal mediastinal adenopathy.  No pneumothorax or pleural effusion.  Scattered patchy densities in both lungs with a prep collection for the lower lobes is present.  No acute bony deformity.  Cirrhotic liver.  Borderline splenomegaly.  Review of the MIP images confirms the above findings.  IMPRESSION: No evidence of acute pulmonary thromboembolism.  Patchy bilateral pneumonitis with a prep collection for the lower lobes.   Electronically Signed   By: Maryclare Bean M.D.   On: 12/21/2012 18:05   Dg Chest Port 1 View  12/21/2012   CLINICAL DATA:  Shortness of breath.  Chest pain.  EXAM: PORTABLE CHEST - 1 VIEW  COMPARISON:  05/15/2011.  FINDINGS: Mediastinum and hilar structures are normal. Poor inspiration. Mild bibasilar atelectasis present. No pleural effusion or pneumothorax. Heart size and pulmonary vascularity normal.  IMPRESSION: Poor inspiration with mild bibasilar atelectasis. Mild i infiltrates in the lung bases cannot be excluded.   Electronically Signed   By: Maisie Fus  Register   On: 12/21/2012 17:11    Medications:  Scheduled Meds: . albuterol  2.5 mg Nebulization Q6H  . amitriptyline  50 mg Oral QHS  . budesonide-formoterol  2 puff Inhalation BID  . ceFEPime (MAXIPIME) IV  1 g Intravenous Q8H  . dextromethorphan-guaiFENesin  1 tablet Oral BID  . entecavir  0.5  mg Oral QODAY  . escitalopram  40 mg Oral Daily  . heparin  5,000 Units Subcutaneous Q8H  . Iloperidone  6 mg Oral Daily  . methylPREDNISolone (SOLU-MEDROL) injection  60 mg Intravenous Q12H  . nicotine  14 mg Transdermal Daily  . oseltamivir  75 mg Oral BID  . pantoprazole  40 mg Oral Daily  . tenofovir  300 mg Oral QODAY  . tiotropium  18 mcg Inhalation  Daily  . vancomycin  750 mg Intravenous Q12H   Continuous Infusions:  PRN Meds:.acetaminophen, acetaminophen, albuterol, ALPRAZolam, ondansetron (ZOFRAN) IV, ondansetron, polyethylene glycol, traMADol  Assessment/Plan:   Healthcare-associated pneumonia: Continue vancomycin and cefepime. Add mucolytic per patient request.    HYPERTENSION    COPD UNSPECIFIED: Not much wheezing currently. Will change steroids to by mouth. Continue bronchodilators. Continue oxygen. She is not on home oxygen.    Tobacco use disorder, interested in quitting. She takes patches at home.    CKD (chronic kidney disease), stage III    Steroid-induced hyperglycemia: We'll give sliding scale insulin and monitor blood glucose.   LOS: 1 day   [name] L 12/22/2012, 12:56 PM

## 2012-12-22 NOTE — Progress Notes (Signed)
ANTIBIOTIC CONSULT NOTE - INITIAL  Pharmacy Consult for Vancomycin, Cefepime Indication: pneumonia  Allergies  Allergen Reactions  . Duloxetine Other (See Comments)    REACTION: Headache  . Gabapentin     REACTION: rash in mouth  . Neomycin-Bacitracin Zn-Polymyx     REACTION: rash/hives    Patient Measurements: Height: 5' 4.8" (164.6 cm) Weight: 206 lb 6.4 oz (93.622 kg) IBW/kg (Calculated) : 56.54  Vital Signs: Temp: 97.6 F (36.4 C) (11/30 0513) Temp src: Oral (11/30 0513) BP: 144/90 mmHg (11/30 0513) Pulse Rate: 66 (11/30 0513) Intake/Output from previous day: 11/29 0701 - 11/30 0700 In: 1225 [I.V.:1125; IV Piggyback:100] Out: 1650 [Urine:1650] Intake/Output from this shift:    Labs:  Recent Labs  12/21/12 1630 12/22/12 0540  WBC 5.1 11.0*  HGB 13.8 12.9  PLT 95* 92*  CREATININE 1.21* 1.11*   Estimated Creatinine Clearance: 63.7 ml/min (by C-G formula based on Cr of 1.11). No results found for this basename: VANCOTROUGH, Leodis Binet, VANCORANDOM, GENTTROUGH, GENTPEAK, GENTRANDOM, TOBRATROUGH, TOBRAPEAK, TOBRARND, AMIKACINPEAK, AMIKACINTROU, AMIKACIN,  in the last 72 hours   Microbiology: Recent Results (from the past 720 hour(s))  MRSA PCR SCREENING     Status: None   Collection Time    12/21/12 11:04 PM      Result Value Range Status   MRSA by PCR NEGATIVE  NEGATIVE Final   Comment:            The GeneXpert MRSA Assay (FDA     approved for NASAL specimens     only), is one component of a     comprehensive MRSA colonization     surveillance program. It is not     intended to diagnose MRSA     infection nor to guide or     monitor treatment for     MRSA infections.    Medical History: Past Medical History  Diagnosis Date  . Hypertension   . COPD (chronic obstructive pulmonary disease)   . Renal insufficiency   . Cirrhosis   . Pulmonary emboli   . Pneumonia     Medications:  Anti-infectives   Start     Dose/Rate Route Frequency Ordered  Stop   12/22/12 1800  vancomycin (VANCOCIN) 1,500 mg in sodium chloride 0.9 % 500 mL IVPB  Status:  Discontinued     1,500 mg 250 mL/hr over 120 Minutes Intravenous Every 24 hours 12/21/12 1741 12/21/12 1936   12/22/12 1000  tenofovir (VIREAD) tablet 300 mg     300 mg Oral Every other day 12/21/12 1936     12/22/12 1000  entecavir (BARACLUDE) tablet 0.5 mg     0.5 mg Oral Every other day 12/21/12 2036     12/22/12 0200  ceFEPIme (MAXIPIME) 1 g in dextrose 5 % 50 mL IVPB     1 g 100 mL/hr over 30 Minutes Intravenous 3 times per day 12/21/12 1936 12/30/12 0559   12/22/12 0000  ceFEPIme (MAXIPIME) 1 g in dextrose 5 % 50 mL IVPB  Status:  Discontinued     1 g 100 mL/hr over 30 Minutes Intravenous Every 8 hours 12/21/12 1741 12/21/12 1936   12/21/12 2200  oseltamivir (TAMIFLU) capsule 75 mg  Status:  Discontinued     75 mg Oral 2 times daily 12/21/12 1835 12/21/12 1936   12/21/12 2200  oseltamivir (TAMIFLU) capsule 75 mg     75 mg Oral 2 times daily 12/21/12 1936 12/26/12 2159   12/21/12 1945  entecavir (BARACLUDE) tablet 0.5 mg  Status:  Discontinued     0.5 mg Oral Every other day 12/21/12 1936 12/21/12 2036   12/21/12 1730  vancomycin (VANCOCIN) 2,000 mg in sodium chloride 0.9 % 500 mL IVPB     2,000 mg 250 mL/hr over 120 Minutes Intravenous  Once 12/21/12 1648 12/21/12 2006   12/21/12 1700  ceFEPIme (MAXIPIME) 2 g in dextrose 5 % 50 mL IVPB     2 g 100 mL/hr over 30 Minutes Intravenous  Once 12/21/12 1648 12/21/12 1729     Assessment: 57 year old female recently treated for pneumonia now presenting with chest pain and shortness of breath. Patients weight on admission was stated to be around 250 lb, however this morning standing weight was actually 206 lb. Will adjust vanc based off patients actual body weight. Patient has renal insufficiency that appears to be at baseline, was given load last night.  Goal of Therapy:  Vancomycin trough level 15-20 mcg/ml  Plan:  Start Vancomycin 750  IV q12h Monitor renal function, cultures, and clinical progression

## 2012-12-23 DIAGNOSIS — K5900 Constipation, unspecified: Secondary | ICD-10-CM

## 2012-12-23 DIAGNOSIS — K7460 Unspecified cirrhosis of liver: Secondary | ICD-10-CM

## 2012-12-23 LAB — GLUCOSE, CAPILLARY
Glucose-Capillary: 181 mg/dL — ABNORMAL HIGH (ref 70–99)
Glucose-Capillary: 216 mg/dL — ABNORMAL HIGH (ref 70–99)
Glucose-Capillary: 123 mg/dL — ABNORMAL HIGH (ref 70–99)

## 2012-12-23 MED ORDER — TENOFOVIR DISOPROXIL FUMARATE 300 MG PO TABS
300.0000 mg | ORAL_TABLET | Freq: Every day | ORAL | Status: DC
  Administered 2012-12-23 – 2012-12-24 (×2): 300 mg via ORAL

## 2012-12-23 MED ORDER — VANCOMYCIN HCL 10 G IV SOLR
1250.0000 mg | Freq: Two times a day (BID) | INTRAVENOUS | Status: DC
  Administered 2012-12-23 – 2012-12-24 (×2): 1250 mg via INTRAVENOUS
  Filled 2012-12-23 (×3): qty 1250

## 2012-12-23 MED ORDER — BISACODYL 10 MG RE SUPP
10.0000 mg | Freq: Once | RECTAL | Status: AC
  Administered 2012-12-23: 10 mg via RECTAL
  Filled 2012-12-23: qty 1

## 2012-12-23 MED ORDER — MAGNESIUM HYDROXIDE 400 MG/5ML PO SUSP
30.0000 mL | Freq: Every day | ORAL | Status: DC
  Administered 2012-12-23: 16:00:00 30 mL via ORAL
  Filled 2012-12-23 (×4): qty 30

## 2012-12-23 MED ORDER — ALBUTEROL SULFATE (5 MG/ML) 0.5% IN NEBU
2.5000 mg | INHALATION_SOLUTION | Freq: Two times a day (BID) | RESPIRATORY_TRACT | Status: DC
  Administered 2012-12-24: 11:00:00 2.5 mg via RESPIRATORY_TRACT
  Filled 2012-12-23: qty 0.5

## 2012-12-23 MED ORDER — PREDNISONE 20 MG PO TABS
40.0000 mg | ORAL_TABLET | Freq: Every day | ORAL | Status: DC
  Administered 2012-12-24: 40 mg via ORAL
  Filled 2012-12-23 (×2): qty 2

## 2012-12-23 NOTE — Progress Notes (Signed)
Patient slept through most of night. Patient states feels need for bowel movement. PRN given for constipation. Oxygen at 2 LPM via nasal cannula.

## 2012-12-23 NOTE — Progress Notes (Signed)
ANTIBIOTIC CONSULT NOTE - FOLLOW UP  Pharmacy Consult for Vancomycin Indication: r/o HCAP  Allergies  Allergen Reactions  . Duloxetine Other (See Comments)    REACTION: Headache  . Gabapentin     REACTION: rash in mouth  . Neomycin-Bacitracin Zn-Polymyx     REACTION: rash/hives    Patient Measurements: Height: 5' 4.8" (164.6 cm) Weight: 206 lb 6.4 oz (93.622 kg) IBW/kg (Calculated) : 56.54  Vital Signs: Temp: 97.7 F (36.5 C) (12/01 1610) Temp src: Oral (12/01 0632) BP: 144/97 mmHg (12/01 9604) Pulse Rate: 68 (12/01 5409) Intake/Output from previous day: 11/30 0701 - 12/01 0700 In: 3012.9 [P.O.:240; I.V.:2372.9; IV Piggyback:400] Out: 2700 [Urine:2700] Intake/Output from this shift: Total I/O In: 480 [P.O.:480] Out: -   Labs:  Recent Labs  12/21/12 1630 12/22/12 0540  WBC 5.1 11.0*  HGB 13.8 12.9  PLT 95* 92*  CREATININE 1.21* 1.11*   Estimated Creatinine Clearance: 63.7 ml/min (by C-G formula based on Cr of 1.11).  Recent Labs  12/23/12 1043  VANCOTROUGH 9.3*     Microbiology: Recent Results (from the past 720 hour(s))  CULTURE, BLOOD (ROUTINE X 2)     Status: None   Collection Time    12/21/12  4:50 PM      Result Value Range Status   Specimen Description BLOOD LEFT ARM   Final   Special Requests     Final   Value: BOTTLES DRAWN AEROBIC AND ANAEROBIC BLUE 10 CC RED 6 CC   Culture  Setup Time     Final   Value: 12/21/2012 20:19     Performed at [facility]   Culture     Final   Value:        BLOOD CULTURE RECEIVED NO GROWTH TO DATE CULTURE WILL BE HELD FOR 5 DAYS BEFORE ISSUING A FINAL NEGATIVE REPORT     Performed at [facility]   Report Status PENDING   Incomplete  URINE CULTURE     Status: None   Collection Time    12/21/12  4:50 PM      Result Value Range Status   Specimen Description URINE, CATHETERIZED   Final   Special Requests NONE   Final   Culture  Setup Time     Final   Value: 12/21/2012 20:16     Performed at  [facility] Count     Final   Value: NO GROWTH     Performed at [facility]   Culture     Final   Value: NO GROWTH     Performed at [facility]   Report Status 12/22/2012 FINAL   Final  CULTURE, BLOOD (ROUTINE X 2)     Status: None   Collection Time    12/21/12  5:00 PM      Result Value Range Status   Specimen Description BLOOD RIGHT HAND   Final   Special Requests BOTTLES DRAWN AEROBIC AND ANAEROBIC 10 CC   Final   Culture  Setup Time     Final   Value: 12/21/2012 20:18     Performed at [facility]   Culture     Final   Value:        BLOOD CULTURE RECEIVED NO GROWTH TO DATE CULTURE WILL BE HELD FOR 5 DAYS BEFORE ISSUING A FINAL NEGATIVE REPORT     Performed at [facility]   Report Status PENDING   Incomplete  MRSA PCR SCREENING  Status: None   Collection Time    12/21/12 11:04 PM      Result Value Range Status   MRSA by PCR NEGATIVE  NEGATIVE Final   Comment:            The GeneXpert MRSA Assay (FDA     approved for NASAL specimens     only), is one component of a     comprehensive MRSA colonization     surveillance program. It is not     intended to diagnose MRSA     infection nor to guide or     monitor treatment for     MRSA infections.    Anti-infectives   Start     Dose/Rate Route Frequency Ordered Stop   12/23/12 1000  tenofovir (VIREAD) tablet 300 mg     300 mg Oral Daily 12/23/12 0832     12/22/12 1800  vancomycin (VANCOCIN) 1,500 mg in sodium chloride 0.9 % 500 mL IVPB  Status:  Discontinued     1,500 mg 250 mL/hr over 120 Minutes Intravenous Every 24 hours 12/21/12 1741 12/21/12 1936   12/22/12 1300  entecavir (BARACLUDE) tablet 0.5 mg     0.5 mg Oral Every other day 12/21/12 2036     12/22/12 1000  tenofovir (VIREAD) tablet 300 mg  Status:  Discontinued     300 mg Oral Every other day 12/21/12 1936 12/23/12 0832   12/22/12 1000  vancomycin (VANCOCIN) IVPB 750 mg/150 ml premix     750 mg 150 mL/hr  over 60 Minutes Intravenous Every 12 hours 12/22/12 0812     12/22/12 0200  ceFEPIme (MAXIPIME) 1 g in dextrose 5 % 50 mL IVPB     1 g 100 mL/hr over 30 Minutes Intravenous 3 times per day 12/21/12 1936 12/30/12 0559   12/22/12 0000  ceFEPIme (MAXIPIME) 1 g in dextrose 5 % 50 mL IVPB  Status:  Discontinued     1 g 100 mL/hr over 30 Minutes Intravenous Every 8 hours 12/21/12 1741 12/21/12 1936   12/21/12 2200  oseltamivir (TAMIFLU) capsule 75 mg  Status:  Discontinued     75 mg Oral 2 times daily 12/21/12 1835 12/21/12 1936   12/21/12 2200  oseltamivir (TAMIFLU) capsule 75 mg  Status:  Discontinued     75 mg Oral 2 times daily 12/21/12 1936 12/22/12 1451   12/21/12 1945  entecavir (BARACLUDE) tablet 0.5 mg  Status:  Discontinued     0.5 mg Oral Every other day 12/21/12 1936 12/21/12 2036   12/21/12 1730  vancomycin (VANCOCIN) 2,000 mg in sodium chloride 0.9 % 500 mL IVPB     2,000 mg 250 mL/hr over 120 Minutes Intravenous  Once 12/21/12 1648 12/21/12 2006   12/21/12 1700  ceFEPIme (MAXIPIME) 2 g in dextrose 5 % 50 mL IVPB     2 g 100 mL/hr over 30 Minutes Intravenous  Once 12/21/12 1648 12/21/12 1729      Assessment: 57 y.o. F who continues on Vancomycin + Cefepime for r/o HCAP. A Vancomycin level drawn this morning was 9.3 however was drawn ~2 hours late, so true trough likely closer to 10.3 mcg/ml. Will increase Vancomycin dose today to target range of 15-20 mcg/ml for r/o HCAP. Afebrile, WBC 11, SCr 1.11, CrCl~60-70 ml/min.   Goal of Therapy:  Vancomycin trough level 15-20 mcg/ml  Plan:  1. Increase Vancomycin to 1250 mg IV every 12 hours 2. Continue Cefepime 1g IV every 8 hours 3. Will continue to  follow renal function, culture results, LOT, and antibiotic de-escalation plans   Georgina Pillion, PharmD, BCPS Clinical Pharmacist Pager: [phone] 12/23/2012 11:48 AM

## 2012-12-23 NOTE — Progress Notes (Signed)
Subjective: Feels bad. RN reports patient took a shower without difficulty. C/o constipation.  Objective: Vital signs in last 24 hours: Temp:  [97.7 F (36.5 C)-98.3 F (36.8 C)] 98.3 F (36.8 C) (12/01 1430) Pulse Rate:  [66-70] 70 (12/01 1430) Resp:  [20] 20 (12/01 1430) BP: (131-153)/(77-97) 131/77 mmHg (12/01 1430) SpO2:  [93 %-97 %] 96 % (12/01 1436) Weight change:  Last BM Date: 12/18/12  Intake/Output from previous day: 11/30 0701 - 12/01 0700 In: 3012.9 [P.O.:240; I.V.:2372.9; IV Piggyback:400] Out: 2700 [Urine:2700] Intake/Output this shift: Total I/O In: 770 [P.O.:720; IV Piggyback:50] Out: -   General: breathing nonlabored Lungs: diminished. No WRR. Good air movement Cardiovascular regular rate rhythm without murmurs gallops rubs Abdomen obese soft nontender nondistended Extremities no clubbing cyanosis or edema.  Lab Results:  Recent Labs  12/21/12 1630 12/22/12 0540  WBC 5.1 11.0*  HGB 13.8 12.9  HCT 41.1 38.8  PLT 95* 92*   BMET  Recent Labs  12/21/12 1630 12/22/12 0540  NA 137 136  K 4.1 3.9  CL 98 100  CO2 26 23  GLUCOSE 105* 225*  BUN 16 16  CREATININE 1.21* 1.11*  CALCIUM 9.3 8.9    Studies/Results: Ct Angio Chest Pe W/cm &/or Wo Cm  12/21/2012   CLINICAL DATA:  Chest pain.  Short of breath.  EXAM: CT ANGIOGRAPHY CHEST WITH CONTRAST  TECHNIQUE: Multidetector CT imaging of the chest was performed using the standard protocol during bolus administration of intravenous contrast. Multiplanar CT image reconstructions including MIPs were obtained to evaluate the vascular anatomy.  CONTRAST:  OMNIPAQUE IOHEXOL 350 MG/ML SOLN  COMPARISON:  11/22/2010  FINDINGS: There are no filling defects in the pulmonary arterial tree to suggest acute pulmonary thromboembolism.  No evidence of aortic dissection. Maximal transverse diameter of the ascending aorta is 3.8 cm. This is stable. Great vessels are patent. Pulmonary artery is also mildly  dilated at 3.5 cm.  No pericardial effusion.  No abnormal mediastinal adenopathy.  No pneumothorax or pleural effusion.  Scattered patchy densities in both lungs with a prep collection for the lower lobes is present.  No acute bony deformity.  Cirrhotic liver.  Borderline splenomegaly.  Review of the MIP images confirms the above findings.  IMPRESSION: No evidence of acute pulmonary thromboembolism.  Patchy bilateral pneumonitis with a prep collection for the lower lobes.   Electronically Signed   By: Maryclare Bean M.D.   On: 12/21/2012 18:05   Dg Chest Port 1 View  12/21/2012   CLINICAL DATA:  Shortness of breath.  Chest pain.  EXAM: PORTABLE CHEST - 1 VIEW  COMPARISON:  05/15/2011.  FINDINGS: Mediastinum and hilar structures are normal. Poor inspiration. Mild bibasilar atelectasis present. No pleural effusion or pneumothorax. Heart size and pulmonary vascularity normal.  IMPRESSION: Poor inspiration with mild bibasilar atelectasis. Mild i infiltrates in the lung bases cannot be excluded.   Electronically Signed   By: Maisie Fus  Register   On: 12/21/2012 17:11    Medications:  Scheduled Meds: . albuterol  2.5 mg Nebulization Q6H  . amitriptyline  50 mg Oral QHS  . budesonide-formoterol  2 puff Inhalation BID  . ceFEPime (MAXIPIME) IV  1 g Intravenous Q8H  . dextromethorphan-guaiFENesin  1 tablet Oral BID  . entecavir  0.5 mg Oral QODAY  . escitalopram  40 mg Oral Daily  . heparin  5,000 Units Subcutaneous Q8H  . Iloperidone  6 mg Oral Daily  . insulin aspart  0-15 Units Subcutaneous TID WC  .  nicotine  14 mg Transdermal Daily  . pantoprazole  40 mg Oral Daily  . predniSONE  40 mg Oral BID  . tenofovir  300 mg Oral Daily  . tiotropium  18 mcg Inhalation Daily  . vancomycin  1,250 mg Intravenous Q12H   Continuous Infusions:  PRN Meds:.acetaminophen, acetaminophen, albuterol, ALPRAZolam, ondansetron (ZOFRAN) IV, ondansetron, polyethylene glycol, traMADol  Assessment/Plan:   Healthcare-associated  pneumonia: Continue vancomycin and cefepime. Increase activities.    HYPERTENSION    COPD UNSPECIFIED: wean oxygen and steroid    Tobacco use disorder, interested in quitting. She takes patches at home.    CKD (chronic kidney disease), stage III    Steroid-induced hyperglycemia: on SSI  Constipation: laxatives   LOS: 2 days   Danialle Dement L 12/23/2012, 2:49 PM

## 2012-12-23 NOTE — Progress Notes (Signed)
SATURATION QUALIFICATIONS: (This note is used to comply with regulatory documentation for home oxygen)  Patient Saturations on Room Air at Rest = 95%  Patient Saturations on Room Air while Ambulating = 95%  Pt ambulated 250 ft in hallways with supervision only. Tolerated well   Peter Congo RN

## 2012-12-24 LAB — GLUCOSE, CAPILLARY

## 2012-12-24 MED ORDER — DOXYCYCLINE HYCLATE 100 MG PO CAPS: 100.0000 mg | ORAL_CAPSULE | Freq: Two times a day (BID) | ORAL | Status: DC

## 2012-12-24 MED ORDER — CEFUROXIME AXETIL 500 MG PO TABS: 500.0000 mg | ORAL_TABLET | Freq: Two times a day (BID) | ORAL | Status: DC

## 2012-12-24 MED ORDER — ILOPERIDONE 1 MG PO TABS
6.0000 mg | ORAL_TABLET | Freq: Every day | ORAL | Status: DC
Start: 2012-12-24 — End: 2012-12-24
  Filled 2012-12-24: qty 6

## 2012-12-24 MED ORDER — PREDNISONE 10 MG PO TABS: ORAL_TABLET | ORAL | Status: DC

## 2012-12-24 MED ORDER — ACETAMINOPHEN 325 MG PO TABS: 650.0000 mg | ORAL_TABLET | Freq: Four times a day (QID) | ORAL | Status: DC | PRN

## 2012-12-24 MED ORDER — DM-GUAIFENESIN ER 30-600 MG PO TB12: 1.0000 | ORAL_TABLET | Freq: Two times a day (BID) | ORAL | Status: DC | PRN

## 2012-12-24 NOTE — Progress Notes (Signed)
[name] Better to be D/C'd Home per MD order.  Discussed with the patient and all questions fully answered.    Medication List         acetaminophen 325 MG tablet  Commonly known as:  TYLENOL  Take 2 tablets (650 mg total) by mouth every 6 (six) hours as needed for mild pain (or Fever >/= 101).     albuterol 108 (90 BASE) MCG/ACT inhaler  Commonly known as:  PROVENTIL HFA;VENTOLIN HFA  Inhale 2 puffs into the lungs every 6 (six) hours as needed. For shortness of breath     ALPRAZolam 1 MG tablet  Commonly known as:  XANAX  Take 1 mg by mouth 3 (three) times daily as needed. For anxiety     amitriptyline 50 MG tablet  Commonly known as:  ELAVIL  Take 50 mg by mouth at bedtime.     budesonide-formoterol 160-4.5 MCG/ACT inhaler  Commonly known as:  SYMBICORT  Inhale 2 puffs into the lungs 2 (two) times daily.     cefUROXime 500 MG tablet  Commonly known as:  CEFTIN  Take 1 tablet (500 mg total) by mouth 2 (two) times daily with a meal.     dextromethorphan-guaiFENesin 30-600 MG per 12 hr tablet  Commonly known as:  MUCINEX DM  Take 1 tablet by mouth 2 (two) times daily as needed for cough.     doxycycline 100 MG capsule  Commonly known as:  VIBRAMYCIN  Take 1 capsule (100 mg total) by mouth 2 (two) times daily.     entecavir 0.5 MG tablet  Commonly known as:  BARACLUDE  Take 0.5 mg by mouth every other day.     escitalopram 20 MG tablet  Commonly known as:  LEXAPRO  Take 40 mg by mouth daily.     FANAPT 6 MG Tabs  Generic drug:  Iloperidone  Take 6 mg by mouth daily.     nicotine 14 mg/24hr patch  Commonly known as:  NICODERM CQ - dosed in mg/24 hours  Place 14 mg onto the skin daily.     omeprazole 40 MG capsule  Commonly known as:  PRILOSEC  Take 40 mg by mouth daily.     predniSONE 10 MG tablet  Commonly known as:  DELTASONE  3 tablets daily for 2 days, then decrease by 1 tablet every 2 days     spironolactone 25 MG tablet  Commonly known as:  ALDACTONE   Take 25 mg by mouth 2 (two) times daily.     tenofovir 300 MG tablet  Commonly known as:  VIREAD  Take 300 mg by mouth every other day.     tiotropium 18 MCG inhalation capsule  Commonly known as:  SPIRIVA  Place 18 mcg into inhaler and inhale daily.     traMADol 50 MG tablet  Commonly known as:  ULTRAM  Take 50 mg by mouth 2 (two) times daily as needed for moderate pain.        VVS, Skin clean, dry and intact without evidence of skin break down, no evidence of skin tears noted. IV catheter discontinued intact. Site without signs and symptoms of complications. Dressing and pressure applied.  An After Visit Summary was printed and given to the patient. Follow up appointments , new prescriptions and medication administration times given. Gave pt handout and discussed s/s of pna. Patient escorted via WC, and D/C home via private auto.  Cindra Eves, RN 12/24/2012 3:55 PM

## 2012-12-24 NOTE — Discharge Summary (Signed)
Physician Discharge Summary  [name] XBJ:[id number] DOB: 02-Aug-1955 DOA: 12/21/2012  PCP: Alease Frame, PHD  Admit date: 12/21/2012 Discharge date: 12/24/2012  Time spent: greater than 30 minutes  Discharge Diagnoses:    Healthcare-associated pneumonia   HYPERTENSION   COPD UNSPECIFIED   Tobacco use disorder    CKD (chronic kidney disease), stage III   Steroid-induced hyperglycemia   Unspecified constipation   Discharge Condition: stable  Filed Weights   12/21/12 1924 12/22/12 0610 12/24/12 0700  Weight: 105.96 kg (233 lb 9.6 oz) 93.622 kg (206 lb 6.4 oz) 103.7 kg (228 lb 9.9 oz)    History of present illness:  57 year old with past medical history of hypertension hepatitis B 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 She also relates some chest pressure. She relates her cough and her shortness of breath has been progressively getting worse to the point when she short of breath while sitting down. She was nothing makes it better or worse. She relates pleuritic chest pain on her left side.  She relates no sick contacts, no fevers at home.no known environmental allergens. CXR showed possible infiltrates.  CTA chest showed no PE, but bilateral lower lobe pneumonitis.  Hospital Course:  Admitted to hospitalists. Started on vancomycin and cefepime steroids.  Steroids tapered.  By discharge, lungs clear, normal room air sats while ambulating.  Required SSI while on steroids, but no h/o DM.    Procedures:  none  Consultations:  none  Discharge Exam: Filed Vitals:   12/24/12 0500  BP: 143/83  Pulse: 75  Temp: 98 F (36.7 C)  Resp: 20    General: comfortable Cardiovascular: RRR Respiratory: CTA  Discharge Instructions  Discharge Orders   Future Orders Complete By Expires   Diet - low sodium heart healthy  As directed    Increase activity slowly  As directed        Medication List         acetaminophen 325 MG tablet  Commonly known as:  TYLENOL  Take 2 tablets (650 mg total) by mouth every 6 (six) hours as needed for mild pain (or Fever >/= 101).     albuterol 108 (90 BASE) MCG/ACT inhaler  Commonly known as:  PROVENTIL HFA;VENTOLIN HFA  Inhale 2 puffs into the lungs every 6 (six) hours as needed. For shortness of breath     ALPRAZolam 1 MG tablet  Commonly known as:  XANAX  Take 1 mg by mouth 3 (three) times daily as needed. For anxiety     amitriptyline 50 MG tablet  Commonly known as:  ELAVIL  Take 50 mg by mouth at bedtime.     budesonide-formoterol 160-4.5 MCG/ACT inhaler  Commonly known as:  SYMBICORT  Inhale 2 puffs into the lungs 2 (two) times daily.     cefUROXime 500 MG tablet  Commonly known as:  CEFTIN  Take 1 tablet (500 mg total) by mouth 2 (two) times daily with a meal.     dextromethorphan-guaiFENesin 30-600 MG per 12 hr tablet  Commonly known as:  MUCINEX DM  Take 1 tablet by mouth 2 (two) times daily as needed for cough.     doxycycline 100 MG capsule  Commonly known as:  VIBRAMYCIN  Take 1 capsule (100 mg total) by mouth 2 (two) times daily.  entecavir 0.5 MG tablet  Commonly known as:  BARACLUDE  Take 0.5 mg by mouth every other day.     escitalopram 20 MG tablet  Commonly known as:  LEXAPRO  Take 40 mg by mouth daily.     FANAPT 6 MG Tabs  Generic drug:  Iloperidone  Take 6 mg by mouth daily.     nicotine 14 mg/24hr patch  Commonly known as:  NICODERM CQ - dosed in mg/24 hours  Place 14 mg onto the skin daily.     omeprazole 40 MG capsule  Commonly known as:  PRILOSEC  Take 40 mg by mouth daily.     predniSONE 10 MG tablet  Commonly known as:  DELTASONE  3 tablets daily for 2 days, then decrease by 1 tablet every 2 days     spironolactone 25 MG tablet  Commonly  known as:  ALDACTONE  Take 25 mg by mouth 2 (two) times daily.     tenofovir 300 MG tablet  Commonly known as:  VIREAD  Take 300 mg by mouth every other day.     tiotropium 18 MCG inhalation capsule  Commonly known as:  SPIRIVA  Place 18 mcg into inhaler and inhale daily.     traMADol 50 MG tablet  Commonly known as:  ULTRAM  Take 50 mg by mouth 2 (two) times daily as needed for moderate pain.       Allergies  Allergen Reactions  . Duloxetine Other (See Comments)    REACTION: Headache  . Gabapentin     REACTION: rash in mouth  . Neomycin-Bacitracin Zn-Polymyx     REACTION: rash/hives       Follow-up Information   Follow up with Alease Frame, PHD. (If symptoms worsen)        The results of significant diagnostics from this hospitalization (including imaging, microbiology, ancillary and laboratory) are listed below for reference.    Significant Diagnostic Studies: Ct Angio Chest Pe W/cm &/or Wo Cm  12/21/2012   CLINICAL DATA:  Chest pain.  Short of breath.  EXAM: CT ANGIOGRAPHY CHEST WITH CONTRAST  TECHNIQUE: Multidetector CT imaging of the chest was performed using the standard protocol during bolus administration of intravenous contrast. Multiplanar CT image reconstructions including MIPs were obtained to evaluate the vascular anatomy.  CONTRAST:  OMNIPAQUE IOHEXOL 350 MG/ML SOLN  COMPARISON:  11/22/2010  FINDINGS: There are no filling defects in the pulmonary arterial tree to suggest acute pulmonary thromboembolism.  No evidence of aortic dissection. Maximal transverse diameter of the ascending aorta is 3.8 cm. This is stable. Great vessels are patent. Pulmonary artery is also mildly dilated at 3.5 cm.  No pericardial effusion.  No abnormal mediastinal adenopathy.  No pneumothorax or pleural effusion.  Scattered patchy densities in both lungs with a prep collection for the lower lobes is present.  No acute bony deformity.  Cirrhotic liver.  Borderline  splenomegaly.  Review of the MIP images confirms the above findings.  IMPRESSION: No evidence of acute pulmonary thromboembolism.  Patchy bilateral pneumonitis with a prep collection for the lower lobes.   Electronically Signed   By: Maryclare Bean M.D.   On: 12/21/2012 18:05   Dg Chest Port 1 View  12/21/2012   CLINICAL DATA:  Shortness of breath.  Chest pain.  EXAM: PORTABLE CHEST - 1 VIEW  COMPARISON:  05/15/2011.  FINDINGS: Mediastinum and hilar structures are normal. Poor inspiration. Mild bibasilar atelectasis present. No pleural effusion or pneumothorax. Heart size and pulmonary  vascularity normal.  IMPRESSION: Poor inspiration with mild bibasilar atelectasis. Mild i infiltrates in the lung bases cannot be excluded.   Electronically Signed   By: Maisie Fus  Register   On: 12/21/2012 17:11    Microbiology: Recent Results (from the past 240 hour(s))  CULTURE, BLOOD (ROUTINE X 2)     Status: None   Collection Time    12/21/12  4:50 PM      Result Value Range Status   Specimen Description BLOOD LEFT ARM   Final   Special Requests     Final   Value: BOTTLES DRAWN AEROBIC AND ANAEROBIC BLUE 10 CC RED 6 CC   Culture  Setup Time     Final   Value: 12/21/2012 20:19     Performed at [facility]   Culture     Final   Value:        BLOOD CULTURE RECEIVED NO GROWTH TO DATE CULTURE WILL BE HELD FOR 5 DAYS BEFORE ISSUING A FINAL NEGATIVE REPORT     Performed at [facility]   Report Status PENDING   Incomplete  URINE CULTURE     Status: None   Collection Time    12/21/12  4:50 PM      Result Value Range Status   Specimen Description URINE, CATHETERIZED   Final   Special Requests NONE   Final   Culture  Setup Time     Final   Value: 12/21/2012 20:16     Performed at [facility] Count     Final   Value: NO GROWTH     Performed at [facility]   Culture     Final   Value: NO GROWTH     Performed at [facility]   Report Status 12/22/2012 FINAL    Final  CULTURE, BLOOD (ROUTINE X 2)     Status: None   Collection Time    12/21/12  5:00 PM      Result Value Range Status   Specimen Description BLOOD RIGHT HAND   Final   Special Requests BOTTLES DRAWN AEROBIC AND ANAEROBIC 10 CC   Final   Culture  Setup Time     Final   Value: 12/21/2012 20:18     Performed at [facility]   Culture     Final   Value:        BLOOD CULTURE RECEIVED NO GROWTH TO DATE CULTURE WILL BE HELD FOR 5 DAYS BEFORE ISSUING A FINAL NEGATIVE REPORT     Performed at [facility]   Report Status PENDING   Incomplete  MRSA PCR SCREENING     Status: None   Collection Time    12/21/12 11:04 PM      Result Value Range Status   MRSA by PCR NEGATIVE  NEGATIVE Final   Comment:            The GeneXpert MRSA Assay (FDA     approved for NASAL specimens     only), is one component of a     comprehensive MRSA colonization     surveillance program. It is not     intended to diagnose MRSA     infection nor to guide or     monitor treatment for     MRSA infections.     Labs: Basic Metabolic Panel:  Recent Labs Lab 12/21/12 1630 12/22/12 0540  NA 137 136  K 4.1 3.9  CL  98 100  CO2 26 23  GLUCOSE 105* 225*  BUN 16 16  CREATININE 1.21* 1.11*  CALCIUM 9.3 8.9   Liver Function Tests:  Recent Labs Lab 12/21/12 1630 12/22/12 0540  AST 43* 43*  ALT 74* 72*  ALKPHOS 120* 102  BILITOT 0.3 0.3  PROT 7.2 6.6  ALBUMIN 3.7 3.0*   No results found for this basename: LIPASE, AMYLASE,  in the last 168 hours No results found for this basename: AMMONIA,  in the last 168 hours CBC:  Recent Labs Lab 12/21/12 1630 12/22/12 0540  WBC 5.1 11.0*  NEUTROABS 4.0  --   HGB 13.8 12.9  HCT 41.1 38.8  MCV 92.2 92.4  PLT 95* 92*   Cardiac Enzymes: No results found for this basename: CKTOTAL, CKMB, CKMBINDEX, TROPONINI,  in the last 168 hours BNP: BNP (last 3 results)  Recent Labs  12/21/12 1630  PROBNP 149.3*   CBG:  Recent Labs Lab  12/23/12 0731 12/23/12 1137 12/23/12 1635 12/23/12 2141 12/24/12 0751  GLUCAP 181* 216* 148* 123* 70    Signed:  Breslin Hemann L  [facility] 12/24/2012, 11:18 AM

## 2012-12-24 NOTE — Care Management Note (Signed)
    Page 1 of 1   12/24/2012     10:50:08 AM   CARE MANAGEMENT NOTE 12/24/2012  Patient:  [name], [name]   Account Number:  [id number]  Date Initiated:  12/24/2012  Documentation initiated by:  Letha Cape  Subjective/Objective Assessment:   dx hap  admit- lives with family.  pta indep.     Action/Plan:   Anticipated DC Date:  12/24/2012   Anticipated DC Plan:  HOME/SELF CARE      DC Planning Services  CM consult      Choice offered to / List presented to:             Status of service:  Completed, signed off Medicare Important Message given?   (If response is "NO", the following Medicare IM given date fields will be blank) Date Medicare IM given:   Date Additional Medicare IM given:    Discharge Disposition:  HOME/SELF CARE  Per UR Regulation:  Reviewed for med. necessity/level of care/duration of stay  If discussed at Long Length of Stay Meetings, dates discussed:    Comments:  12/24/12 10:48 Letha Cape RN, BSN [phone] patient lives with family, pta indep.  No NCM referral, no needs anticipated.  Patient for possible dc today.

## 2012-12-27 LAB — CULTURE, BLOOD (ROUTINE X 2): Culture: NO GROWTH

## 2013-01-14 ENCOUNTER — Emergency Department (HOSPITAL_COMMUNITY): Payer: Medicare Other

## 2013-01-14 ENCOUNTER — Encounter (HOSPITAL_COMMUNITY): Payer: Self-pay | Admitting: Emergency Medicine

## 2013-01-14 ENCOUNTER — Emergency Department (HOSPITAL_COMMUNITY)
Admission: EM | Admit: 2013-01-14 | Discharge: 2013-01-14 | Disposition: A | Discharge status: Home / Self Care | Payer: Medicare Other | Attending: Emergency Medicine | Admitting: Emergency Medicine

## 2013-01-14 DIAGNOSIS — Z86711 Personal history of pulmonary embolism: Secondary | ICD-10-CM | POA: Insufficient documentation

## 2013-01-14 DIAGNOSIS — IMO0002 Reserved for concepts with insufficient information to code with codable children: Secondary | ICD-10-CM | POA: Insufficient documentation

## 2013-01-14 DIAGNOSIS — J4489 Other specified chronic obstructive pulmonary disease: Secondary | ICD-10-CM | POA: Insufficient documentation

## 2013-01-14 DIAGNOSIS — T424X4A Poisoning by benzodiazepines, undetermined, initial encounter: Secondary | ICD-10-CM | POA: Insufficient documentation

## 2013-01-14 DIAGNOSIS — I10 Essential (primary) hypertension: Secondary | ICD-10-CM | POA: Insufficient documentation

## 2013-01-14 DIAGNOSIS — T50901A Poisoning by unspecified drugs, medicaments and biological substances, accidental (unintentional), initial encounter: Secondary | ICD-10-CM

## 2013-01-14 DIAGNOSIS — F411 Generalized anxiety disorder: Secondary | ICD-10-CM

## 2013-01-14 DIAGNOSIS — Z79899 Other long term (current) drug therapy: Secondary | ICD-10-CM | POA: Insufficient documentation

## 2013-01-14 DIAGNOSIS — Z8701 Personal history of pneumonia (recurrent): Secondary | ICD-10-CM | POA: Insufficient documentation

## 2013-01-14 DIAGNOSIS — Y939 Activity, unspecified: Secondary | ICD-10-CM | POA: Insufficient documentation

## 2013-01-14 DIAGNOSIS — Z8719 Personal history of other diseases of the digestive system: Secondary | ICD-10-CM | POA: Insufficient documentation

## 2013-01-14 DIAGNOSIS — J449 Chronic obstructive pulmonary disease, unspecified: Secondary | ICD-10-CM | POA: Insufficient documentation

## 2013-01-14 DIAGNOSIS — T43591A Poisoning by other antipsychotics and neuroleptics, accidental (unintentional), initial encounter: Secondary | ICD-10-CM | POA: Insufficient documentation

## 2013-01-14 DIAGNOSIS — Z87891 Personal history of nicotine dependence: Secondary | ICD-10-CM | POA: Insufficient documentation

## 2013-01-14 DIAGNOSIS — Y929 Unspecified place or not applicable: Secondary | ICD-10-CM | POA: Insufficient documentation

## 2013-01-14 DIAGNOSIS — Z87448 Personal history of other diseases of urinary system: Secondary | ICD-10-CM | POA: Insufficient documentation

## 2013-01-14 DIAGNOSIS — E669 Obesity, unspecified: Secondary | ICD-10-CM | POA: Insufficient documentation

## 2013-01-14 LAB — COMPREHENSIVE METABOLIC PANEL
ALT: 19 U/L (ref 0–35)
AST: 18 U/L (ref 0–37)
Alkaline Phosphatase: 103 U/L (ref 39–117)
BUN: 25 mg/dL — ABNORMAL HIGH (ref 6–23)
CO2: 24 mEq/L (ref 19–32)
Calcium: 9.8 mg/dL (ref 8.4–10.5)
Chloride: 103 mEq/L (ref 96–112)
GFR calc Af Amer: 36 mL/min — ABNORMAL LOW (ref >90)
GFR calc non Af Amer: 31 mL/min — ABNORMAL LOW (ref >90)
Glucose, Bld: 81 mg/dL (ref 70–99)
Potassium: 4.3 mEq/L (ref 3.5–5.1)
Sodium: 138 mEq/L (ref 135–145)
Total Bilirubin: 0.2 mg/dL — ABNORMAL LOW (ref 0.3–1.2)

## 2013-01-14 LAB — RAPID URINE DRUG SCREEN, HOSP PERFORMED
Barbiturates: NOT DETECTED
Opiates: NOT DETECTED
Tetrahydrocannabinol: NOT DETECTED

## 2013-01-14 LAB — URINALYSIS, ROUTINE W REFLEX MICROSCOPIC
Bilirubin Urine: NEGATIVE
Hgb urine dipstick: NEGATIVE
Nitrite: NEGATIVE
Protein, ur: NEGATIVE mg/dL
Specific Gravity, Urine: 1.019 (ref 1.005–1.030)
Urobilinogen, UA: 0.2 mg/dL (ref 0.0–1.0)

## 2013-01-14 LAB — CBC
HCT: 39.7 % (ref 36.0–46.0)
Hemoglobin: 13.1 g/dL (ref 12.0–15.0)
MCHC: 33 g/dL (ref 30.0–36.0)
MCV: 93.4 fL (ref 78.0–100.0)
RBC: 4.25 MIL/uL (ref 3.87–5.11)

## 2013-01-14 LAB — ACETAMINOPHEN LEVEL: Acetaminophen (Tylenol), Serum: <15 ug/mL (ref 10–30)

## 2013-01-14 LAB — SALICYLATE LEVEL: Salicylate Lvl: <2 mg/dL — ABNORMAL LOW (ref 2.8–20.0)

## 2013-01-14 LAB — ETHANOL: Alcohol, Ethyl (B): <11 mg/dL (ref 0–11)

## 2013-01-14 MED ORDER — LORAZEPAM 1 MG PO TABS: 1.0000 mg | ORAL_TABLET | Freq: Three times a day (TID) | ORAL | Status: DC | PRN

## 2013-01-14 MED ORDER — IBUPROFEN 200 MG PO TABS: 600.0000 mg | ORAL_TABLET | Freq: Three times a day (TID) | ORAL | Status: DC | PRN

## 2013-01-14 MED ORDER — ALUM & MAG HYDROXIDE-SIMETH 200-200-20 MG/5ML PO SUSP: 30.0000 mL | ORAL | Status: DC | PRN

## 2013-01-14 MED ORDER — NICOTINE 21 MG/24HR TD PT24: 21.0000 mg | MEDICATED_PATCH | Freq: Every day | TRANSDERMAL | Status: DC

## 2013-01-14 MED ORDER — ONDANSETRON HCL 4 MG PO TABS: 4.0000 mg | ORAL_TABLET | Freq: Three times a day (TID) | ORAL | Status: DC | PRN

## 2013-01-14 MED ORDER — ZOLPIDEM TARTRATE 5 MG PO TABS: 5.0000 mg | ORAL_TABLET | Freq: Every evening | ORAL | Status: DC | PRN

## 2013-01-14 NOTE — ED Provider Notes (Signed)
CSN: [id number]     Arrival date & time 01/14/13  1140 History   First MD Initiated Contact with Patient 01/14/13 1146     Chief Complaint  Patient presents with  . Drug Overdose   (Consider location/radiation/quality/duration/timing/severity/associated sxs/prior Treatment) HPI Comments: Patient is a 57 year old female with a past medical history of hypertension, COPD, renal insufficiency, cirrhosis and PE who presents to the emergency department via EMS after overdosing on Xanax. Patient's daughter called EMS because she believes her mother took 7 Xanax, however patient states she only took 5. During this exam, patient switches her number of the amount of Xanax that she took today. Denies any other drug or alcohol use. Denies suicidal or homicidal ideations. Despite triage summary, she is alert and oriented x3, however is very tangential in her conversation and says she was playing hide-and-seek with her friends outside, seems intoxicated. Level 5 caveat.  Patient is a 57 y.o. female presenting with Overdose. The history is provided by the EMS personnel.  Drug Overdose    Past Medical History  Diagnosis Date  . Hypertension   . COPD (chronic obstructive pulmonary disease)   . Renal insufficiency   . Cirrhosis   . Pulmonary emboli   . Pneumonia    Past Surgical History  Procedure Laterality Date  . Abdominal hysterectomy     Family History  Problem Relation Age of Onset  . Stroke Mother   . Lung cancer Father    History  Substance Use Topics  . Smoking status: Former Smoker -- 1.00 packs/day    Types: Cigarettes  . Smokeless tobacco: Not on file  . Alcohol Use: No     Comment: former drinker   OB History   Grav Para Term Preterm Abortions TAB SAB Ect Mult Living                 Review of Systems  Unable to perform ROS: Other    Allergies  Duloxetine; Gabapentin; and Neomycin-bacitracin zn-polymyx  Home Medications   Current Outpatient Rx  Name  Route  Sig   Dispense  Refill  . acetaminophen (TYLENOL) 325 MG tablet   Oral   Take 2 tablets (650 mg total) by mouth every 6 (six) hours as needed for mild pain (or Fever >/= 101).         Marland Kitchen albuterol (PROVENTIL HFA;VENTOLIN HFA) 108 (90 BASE) MCG/ACT inhaler   Inhalation   Inhale 2 puffs into the lungs every 6 (six) hours as needed. For shortness of breath          . ALPRAZolam (XANAX) 1 MG tablet   Oral   Take 1 mg by mouth 3 (three) times daily as needed. For anxiety         . amitriptyline (ELAVIL) 50 MG tablet   Oral   Take 100 mg by mouth at bedtime.          . budesonide-formoterol (SYMBICORT) 160-4.5 MCG/ACT inhaler   Inhalation   Inhale 2 puffs into the lungs 2 (two) times daily.           Marland Kitchen entecavir (BARACLUDE) 0.5 MG tablet   Oral   Take 0.5 mg by mouth every other day.          . escitalopram (LEXAPRO) 20 MG tablet   Oral   Take 40 mg by mouth daily.          . Iloperidone (FANAPT) 6 MG TABS   Oral   Take 6 mg by  mouth daily.         . Multiple Vitamin (MULTIVITAMIN WITH MINERALS) TABS tablet   Oral   Take 1 tablet by mouth daily.         . nicotine (NICODERM CQ - DOSED IN MG/24 HOURS) 14 mg/24hr patch   Transdermal   Place 14 mg onto the skin daily.         Marland Kitchen omeprazole (PRILOSEC) 40 MG capsule   Oral   Take 80 mg by mouth daily.          Marland Kitchen spironolactone (ALDACTONE) 25 MG tablet   Oral   Take 25 mg by mouth 2 (two) times daily.           Marland Kitchen tenofovir (VIREAD) 300 MG tablet   Oral   Take 300 mg by mouth every other day.         . tiotropium (SPIRIVA) 18 MCG inhalation capsule   Inhalation   Place 18 mcg into inhaler and inhale daily.            BP 124/85  Pulse 82  Temp(Src) 98.1 F (36.7 C) (Oral)  Resp 20  SpO2 97% Physical Exam  Nursing note and vitals reviewed. Constitutional: She is oriented to person, place, and time. She appears well-developed and well-nourished. No distress.  Obese  HENT:  Head: Normocephalic  and atraumatic.  Mouth/Throat: Oropharynx is clear and moist.  Eyes: Conjunctivae and EOM are normal. Pupils are equal, round, and reactive to light.  Neck: Normal range of motion. Neck supple.  Cardiovascular: Normal rate, regular rhythm and normal heart sounds.   Pulmonary/Chest: Effort normal and breath sounds normal.  Scattered ronchi and wheezes, R>L.  Abdominal: Soft. Bowel sounds are normal. There is no tenderness.  Musculoskeletal: Normal range of motion. She exhibits no edema.  Neurological: She is alert and oriented to person, place, and time. She has normal strength. No cranial nerve deficit or sensory deficit. GCS eye subscore is 4. GCS verbal subscore is 5. GCS motor subscore is 6.  Follows commands, moves limbs without ataxia.  Skin: Skin is warm and dry. She is not diaphoretic.  Psychiatric: Her speech is tangential and slurred. She expresses no homicidal and no suicidal ideation. She is inattentive.    ED Course  Procedures (including critical care time) Labs Review Labs Reviewed  CBC - Abnormal; Notable for the following:    Platelets 104 (*)    All other components within normal limits  COMPREHENSIVE METABOLIC PANEL - Abnormal; Notable for the following:    BUN 25 (*)    Creatinine, Ser 1.78 (*)    Total Bilirubin 0.2 (*)    GFR calc non Af Amer 31 (*)    GFR calc Af Amer 36 (*)    All other components within normal limits  URINE RAPID DRUG SCREEN (HOSP PERFORMED) - Abnormal; Notable for the following:    Benzodiazepines POSITIVE (*)    All other components within normal limits  SALICYLATE LEVEL - Abnormal; Notable for the following:    Salicylate Lvl <2.0 (*)    All other components within normal limits  ETHANOL  ACETAMINOPHEN LEVEL  URINALYSIS, ROUTINE W REFLEX MICROSCOPIC   Imaging Review Dg Chest 2 View  01/14/2013   CLINICAL DATA:  Overdose, chest pain, history hypertension, COPD, smoking, cirrhosis  EXAM: CHEST  2 VIEW  COMPARISON:  12/21/2012   FINDINGS: Normal heart size, mediastinal contours, and pulmonary vascularity.  Minimal bibasilar atelectasis.  Lungs otherwise clear.  No  pleural effusion or pneumothorax.  No acute osseous findings.  IMPRESSION: Minimal bibasilar atelectasis.   Electronically Signed   By: Ulyses Southward M.D.   On: 01/14/2013 13:03    EKG Interpretation   None       MDM   1. Drug overdose, initial encounter     Pt presenting with possible OD with Xanax. Pt AAOx3, however very tangential, slurred speech. Denies SI/HI. Unknown how much xanax she took. Denies any other drug or alcohol use. Labs pending. Placed on cardiac monitor. CXR ordered due to scattered wheezes/ronchi, hx COPD. O2 sat 97% on RA. Vitals stable. 3:31 PM Daughter present in room, states pt has hx of cocaine abuse, clean for 7 months. She filled her xanax yesterday, 120 pills, and 10 were gone this morning. Denies any hx of SI/HI. 4:00 PM 12/23 Pt signed out to Junious Silk, PA-C at shift change. Pt medically cleared.  Trevor Mace, PA-C 01/15/13 1540

## 2013-01-14 NOTE — ED Notes (Signed)
Bed: WA06 Expected date:  Expected time:  Means of arrival:  Comments: EMS 

## 2013-01-14 NOTE — ED Notes (Signed)
Pt alert and oriented. Cooperative and answering questions. In NAD.

## 2013-01-14 NOTE — ED Notes (Signed)
Per EMS daughter called and believes her mother took 8 Xanax, pt says took 5. Denies SI/HI. Pt alert at this time but is disoriented. Vitals were normal.

## 2013-01-14 NOTE — Progress Notes (Signed)
Patient does not meet criteria for inpatient hospitalization, per Dr. Ladona Ridgel and NP Denice Bors). Patient has been given referrals for outpatient therapy.   Writer inform the ER MD and the nurse working with the patient.

## 2013-01-14 NOTE — Consult Note (Signed)
[facility] Face-to-Face Psychiatry Consult   Reason for Consult:  Suicidal Ideation  Referring Physician:  EDP  [name] is an 57 y.o. female.  Assessment: AXIS I:  Depressive Disorder NOS AXIS II:  Deferred AXIS III:   Past Medical History  Diagnosis Date  . Hypertension   . COPD (chronic obstructive pulmonary disease)   . Renal insufficiency   . Cirrhosis   . Pulmonary emboli   . Pneumonia    AXIS IV:  other psychosocial or environmental problems AXIS V:  61-70 mild symptoms  Plan:  No evidence of imminent risk to self or others at present.   Patient does not meet criteria for psychiatric inpatient admission. Supportive therapy provided about ongoing stressors. Discussed crisis plan, support from social network, calling 911, coming to the Emergency Department, and calling Suicide Hotline.  Subjective:   [name] is a 57 y.o. female.  HPI:  Patient states that she came to the hospital because she was running a fever off and on and "when it got up to 104 I came to the emergency room.  No I am not suicidal.  I told them that can take xanax 4 times a tid during the day  Take one (0.5) but at bed time I can take two (1 mg); and that is 5.  I am not homicidal or paranoid.  I do have a history of hearing voices but I take Fanapt for that and it works good."    Past Psychiatric History: Past Medical History  Diagnosis Date  . Hypertension   . COPD (chronic obstructive pulmonary disease)   . Renal insufficiency   . Cirrhosis   . Pulmonary emboli   . Pneumonia     reports that she has quit smoking. Her smoking use included Cigarettes. She smoked 1.00 pack per day. She does not have any smokeless tobacco history on file. She reports that she does not drink alcohol or use illicit drugs. Family History  Problem Relation Age of Onset  . Stroke Mother   . Lung cancer Father            Allergies:   Allergies  Allergen Reactions  . Duloxetine Other (See Comments)     REACTION: Headache  . Gabapentin     REACTION: rash in mouth  . Neomycin-Bacitracin Zn-Polymyx     REACTION: rash/hives    ACT Assessment Complete:  No:   Past Psychiatric History: Diagnosis:    Hospitalizations:  Yes  Outpatient Care:  Yes  Substance Abuse Care:    Self-Mutilation:  denies  Suicidal Attempts:    Homicidal Behaviors:  denies   Violent Behaviors:  denies   Place of Residence:  [place] Marital Status:  Single Employed/Unemployed:  Unemployed Education:   Family Supports:  Yes Objective: Blood pressure 124/85, pulse 82, temperature 98.1 F (36.7 C), temperature source Oral, resp. rate 20, SpO2 97.00%.There is no weight on file to calculate BMI. Results for orders placed during the hospital encounter of 01/14/13 (from the past 72 hour(s))  CBC     Status: Abnormal   Collection Time    01/14/13 12:25 PM      Result Value Range   WBC 4.9  4.0 - 10.5 K/uL   RBC 4.25  3.87 - 5.11 MIL/uL   Hemoglobin 13.1  12.0 - 15.0 g/dL   HCT 16.1  09.6 - 04.5 %   MCV 93.4  78.0 - 100.0 fL   MCH 30.8  26.0 - 34.0 pg  MCHC 33.0  30.0 - 36.0 g/dL   RDW 16.1  09.6 - 04.5 %   Platelets 104 (*) 150 - 400 K/uL   Comment: PLATELET COUNT CONFIRMED BY SMEAR     SPECIMEN CHECKED FOR CLOTS     RESULT REPEATED AND VERIFIED  COMPREHENSIVE METABOLIC PANEL     Status: Abnormal   Collection Time    01/14/13 12:25 PM      Result Value Range   Sodium 138  135 - 145 mEq/L   Potassium 4.3  3.5 - 5.1 mEq/L   Chloride 103  96 - 112 mEq/L   CO2 24  19 - 32 mEq/L   Glucose, Bld 81  70 - 99 mg/dL   BUN 25 (*) 6 - 23 mg/dL   Creatinine, Ser 4.09 (*) 0.50 - 1.10 mg/dL   Calcium 9.8  8.4 - 81.1 mg/dL   Total Protein 7.4  6.0 - 8.3 g/dL   Albumin 3.9  3.5 - 5.2 g/dL   AST 18  0 - 37 U/L   ALT 19  0 - 35 U/L   Alkaline Phosphatase 103  39 - 117 U/L   Total Bilirubin 0.2 (*) 0.3 - 1.2 mg/dL   GFR calc non Af Amer 31 (*) >90 mL/min   GFR calc Af Amer 36 (*) >90 mL/min   Comment: (NOTE)      The eGFR has been calculated using the CKD EPI equation.     This calculation has not been validated in all clinical situations.     eGFR's persistently <90 mL/min signify possible Chronic Kidney     Disease.  ETHANOL     Status: None   Collection Time    01/14/13 12:25 PM      Result Value Range   Alcohol, Ethyl (B) <11  0 - 11 mg/dL   Comment:            LOWEST DETECTABLE LIMIT FOR     SERUM ALCOHOL IS 11 mg/dL     FOR MEDICAL PURPOSES ONLY  SALICYLATE LEVEL     Status: Abnormal   Collection Time    01/14/13 12:25 PM      Result Value Range   Salicylate Lvl <2.0 (*) 2.8 - 20.0 mg/dL  ACETAMINOPHEN LEVEL     Status: None   Collection Time    01/14/13 12:25 PM      Result Value Range   Acetaminophen (Tylenol), Serum <15.0  10 - 30 ug/mL   Comment:            THERAPEUTIC CONCENTRATIONS VARY     SIGNIFICANTLY. A RANGE OF 10-30     ug/mL MAY BE AN EFFECTIVE     CONCENTRATION FOR MANY PATIENTS.     HOWEVER, SOME ARE BEST TREATED     AT CONCENTRATIONS OUTSIDE THIS     RANGE.     ACETAMINOPHEN CONCENTRATIONS     >150 ug/mL AT 4 HOURS AFTER     INGESTION AND >50 ug/mL AT 12     HOURS AFTER INGESTION ARE     OFTEN ASSOCIATED WITH TOXIC     REACTIONS.  URINE RAPID DRUG SCREEN (HOSP PERFORMED)     Status: Abnormal   Collection Time    01/14/13  1:20 PM      Result Value Range   Opiates NONE DETECTED  NONE DETECTED   Cocaine NONE DETECTED  NONE DETECTED   Benzodiazepines POSITIVE (*) NONE DETECTED   Amphetamines NONE DETECTED  NONE  DETECTED   Tetrahydrocannabinol NONE DETECTED  NONE DETECTED   Barbiturates NONE DETECTED  NONE DETECTED   Comment:            DRUG SCREEN FOR MEDICAL PURPOSES     ONLY.  IF CONFIRMATION IS NEEDED     FOR ANY PURPOSE, NOTIFY LAB     WITHIN 5 DAYS.                LOWEST DETECTABLE LIMITS     FOR URINE DRUG SCREEN     Drug Class       Cutoff (ng/mL)     Amphetamine      1000     Barbiturate      200     Benzodiazepine   200     Tricyclics        300     Opiates          300     Cocaine          300     THC              50  URINALYSIS, ROUTINE W REFLEX MICROSCOPIC     Status: None   Collection Time    01/14/13  1:20 PM      Result Value Range   Color, Urine YELLOW  YELLOW   APPearance CLEAR  CLEAR   Specific Gravity, Urine 1.019  1.005 - 1.030   pH 5.5  5.0 - 8.0   Glucose, UA NEGATIVE  NEGATIVE mg/dL   Hgb urine dipstick NEGATIVE  NEGATIVE   Bilirubin Urine NEGATIVE  NEGATIVE   Ketones, ur NEGATIVE  NEGATIVE mg/dL   Protein, ur NEGATIVE  NEGATIVE mg/dL   Urobilinogen, UA 0.2  0.0 - 1.0 mg/dL   Nitrite NEGATIVE  NEGATIVE   Leukocytes, UA NEGATIVE  NEGATIVE   Comment: MICROSCOPIC NOT DONE ON URINES WITH NEGATIVE PROTEIN, BLOOD, LEUKOCYTES, NITRITE, OR GLUCOSE <1000 mg/dL.     Current Facility-Administered Medications  Medication Dose Route Frequency Provider Last Rate Last Dose  . alum & mag hydroxide-simeth (MAALOX/MYLANTA) 200-200-20 MG/5ML suspension 30 mL  30 mL Oral PRN Trevor Mace, PA-C      . ibuprofen (ADVIL,MOTRIN) tablet 600 mg  600 mg Oral Q8H PRN Trevor Mace, PA-C      . LORazepam (ATIVAN) tablet 1 mg  1 mg Oral Q8H PRN Trevor Mace, PA-C      . nicotine (NICODERM CQ - dosed in mg/24 hours) patch 21 mg  21 mg Transdermal Daily Robyn M Albert, PA-C      . ondansetron [facility]) tablet 4 mg  4 mg Oral Q8H PRN Trevor Mace, PA-C      . zolpidem (AMBIEN) tablet 5 mg  5 mg Oral QHS PRN Trevor Mace, PA-C       Current Outpatient Prescriptions  Medication Sig Dispense Refill  . acetaminophen (TYLENOL) 325 MG tablet Take 2 tablets (650 mg total) by mouth every 6 (six) hours as needed for mild pain (or Fever >/= 101).      Marland Kitchen albuterol (PROVENTIL HFA;VENTOLIN HFA) 108 (90 BASE) MCG/ACT inhaler Inhale 2 puffs into the lungs every 6 (six) hours as needed. For shortness of breath       . ALPRAZolam (XANAX) 1 MG tablet Take 1 mg by mouth 3 (three) times daily as needed. For anxiety      . amitriptyline  (ELAVIL) 50 MG tablet Take 100 mg by mouth at  bedtime.       . budesonide-formoterol (SYMBICORT) 160-4.5 MCG/ACT inhaler Inhale 2 puffs into the lungs 2 (two) times daily.        Marland Kitchen entecavir (BARACLUDE) 0.5 MG tablet Take 0.5 mg by mouth every other day.       . escitalopram (LEXAPRO) 20 MG tablet Take 40 mg by mouth daily.       . Iloperidone (FANAPT) 6 MG TABS Take 6 mg by mouth daily.      . Multiple Vitamin (MULTIVITAMIN WITH MINERALS) TABS tablet Take 1 tablet by mouth daily.      . nicotine (NICODERM CQ - DOSED IN MG/24 HOURS) 14 mg/24hr patch Place 14 mg onto the skin daily.      Marland Kitchen omeprazole (PRILOSEC) 40 MG capsule Take 80 mg by mouth daily.       Marland Kitchen spironolactone (ALDACTONE) 25 MG tablet Take 25 mg by mouth 2 (two) times daily.        Marland Kitchen tenofovir (VIREAD) 300 MG tablet Take 300 mg by mouth every other day.      . tiotropium (SPIRIVA) 18 MCG inhalation capsule Place 18 mcg into inhaler and inhale daily.        . [DISCONTINUED] citalopram (CELEXA) 20 MG tablet Take 20 mg by mouth daily.        . [DISCONTINUED] doxepin (SINEQUAN) 150 MG capsule Take 150 mg by mouth at bedtime.        . [DISCONTINUED] enoxaparin (LOVENOX) 120 MG/0.8ML SOLN Inject 0.8 mLs (120 mg total) into the skin every 12 (twelve) hours.  12.6 mL  5  . [DISCONTINUED] loratadine (CLARITIN) 10 MG tablet Take 10 mg by mouth daily.          Psychiatric Specialty Exam:     Blood pressure 124/85, pulse 82, temperature 98.1 F (36.7 C), temperature source Oral, resp. rate 20, SpO2 97.00%.There is no weight on file to calculate BMI.  General Appearance: Disheveled  Eye Contact::  Good  Speech:  Clear and Coherent and Normal Rate  Volume:  Normal  Mood:  Anxious  Affect:  Congruent  Thought Process:  Circumstantial  Orientation:  Full (Time, Place, and Person)  Thought Content:  WDL  Suicidal Thoughts:  No  Homicidal Thoughts:  No  Memory:  Immediate;   Good Recent;   Good  Judgement:  Good  Insight:  Present   Psychomotor Activity:  Normal  Concentration:  Good  Recall:  Good  Akathisia:  No  Handed:  Right  AIMS (if indicated):     Assets:  Communication Skills Desire for Improvement Housing Social Support Transportation  Sleep:      Face to face consult/interview with Dr. Ladona Ridgel  Treatment Plan Summary: Outpatient resources  Disposition:  Discharge home. Patient to follow up with her primary psych provider.  Assunta Found, FNP-BC 01/14/2013 3:55 PM

## 2013-01-14 NOTE — ED Notes (Signed)
[facility] Phone number [phone]. Cain Sieve.

## 2013-01-14 NOTE — ED Notes (Signed)
Pt able to ambulate with steady gait. Pt A&O x 4.

## 2013-01-14 NOTE — ED Notes (Signed)
Pt very unsteady while ambulating, not eligible to go to Psych ED at this time.

## 2013-01-14 NOTE — ED Notes (Signed)
Pt's daughter Whitney Post on her way to pick up pt.

## 2013-01-15 NOTE — ED Provider Notes (Signed)
Medical screening examination/treatment/procedure(s) were performed by non-physician practitioner and as supervising physician I was immediately available for consultation/collaboration.  EKG Interpretation    Date/Time:  Tuesday January 14 2013 12:22:29 EST Ventricular Rate:  82 PR Interval:  178 QRS Duration: 102 QT Interval:  375 QTC Calculation: 438 R Axis:   -19 Text Interpretation:  Sinus rhythm Consider left atrial enlargement Borderline left axis deviation Abnormal R-wave progression, late transition nonspecific st/t wave changes Since previous tracing rate slower Confirmed by Karma Ganja  MD, Eliot Bencivenga [phone]) on 01/14/2013 3:50:29 PM             Ethelda Chick, MD 01/15/13 [phone]

## 2013-01-15 NOTE — Consult Note (Signed)
Note reviewed and agreed with  

## 2013-07-14 ENCOUNTER — Encounter (HOSPITAL_COMMUNITY): Payer: Self-pay | Admitting: Emergency Medicine

## 2013-07-14 ENCOUNTER — Emergency Department (HOSPITAL_COMMUNITY): Payer: Medicare Other

## 2013-07-14 DIAGNOSIS — I10 Essential (primary) hypertension: Secondary | ICD-10-CM | POA: Diagnosis not present

## 2013-07-14 DIAGNOSIS — Z87891 Personal history of nicotine dependence: Secondary | ICD-10-CM | POA: Insufficient documentation

## 2013-07-14 DIAGNOSIS — Z86711 Personal history of pulmonary embolism: Secondary | ICD-10-CM | POA: Insufficient documentation

## 2013-07-14 DIAGNOSIS — Z79899 Other long term (current) drug therapy: Secondary | ICD-10-CM | POA: Insufficient documentation

## 2013-07-14 DIAGNOSIS — R000 Tachycardia, unspecified: Secondary | ICD-10-CM | POA: Insufficient documentation

## 2013-07-14 DIAGNOSIS — J441 Chronic obstructive pulmonary disease with (acute) exacerbation: Secondary | ICD-10-CM | POA: Diagnosis not present

## 2013-07-14 DIAGNOSIS — IMO0002 Reserved for concepts with insufficient information to code with codable children: Secondary | ICD-10-CM | POA: Insufficient documentation

## 2013-07-14 DIAGNOSIS — Z8719 Personal history of other diseases of the digestive system: Secondary | ICD-10-CM | POA: Diagnosis not present

## 2013-07-14 DIAGNOSIS — Z791 Long term (current) use of non-steroidal anti-inflammatories (NSAID): Secondary | ICD-10-CM | POA: Insufficient documentation

## 2013-07-14 DIAGNOSIS — Z87448 Personal history of other diseases of urinary system: Secondary | ICD-10-CM | POA: Diagnosis not present

## 2013-07-14 DIAGNOSIS — R079 Chest pain, unspecified: Secondary | ICD-10-CM | POA: Diagnosis not present

## 2013-07-14 DIAGNOSIS — Z8701 Personal history of pneumonia (recurrent): Secondary | ICD-10-CM | POA: Insufficient documentation

## 2013-07-14 DIAGNOSIS — R0602 Shortness of breath: Secondary | ICD-10-CM | POA: Diagnosis present

## 2013-07-14 LAB — COMPREHENSIVE METABOLIC PANEL
ALT: 12 U/L (ref 0–35)
AST: 16 U/L (ref 0–37)
Albumin: 4.3 g/dL (ref 3.5–5.2)
Alkaline Phosphatase: 99 U/L (ref 39–117)
BUN: 37 mg/dL — ABNORMAL HIGH (ref 6–23)
CALCIUM: 9.9 mg/dL (ref 8.4–10.5)
CO2: 24 meq/L (ref 19–32)
CREATININE: 2.22 mg/dL — AB (ref 0.50–1.10)
Chloride: 99 mEq/L (ref 96–112)
GFR, EST AFRICAN AMERICAN: 27 mL/min — AB (ref >90)
GFR, EST NON AFRICAN AMERICAN: 24 mL/min — AB (ref >90)
Glucose, Bld: 90 mg/dL (ref 70–99)
Potassium: 4.4 mEq/L (ref 3.7–5.3)
SODIUM: 140 meq/L (ref 137–147)
Total Bilirubin: 0.4 mg/dL (ref 0.3–1.2)
Total Protein: 7.3 g/dL (ref 6.0–8.3)

## 2013-07-14 LAB — CBC WITH DIFFERENTIAL/PLATELET
Basophils Absolute: 0 10*3/uL (ref 0.0–0.1)
Basophils Relative: 0 % (ref 0–1)
EOS PCT: 0 % (ref 0–5)
Eosinophils Absolute: 0 10*3/uL (ref 0.0–0.7)
HEMATOCRIT: 40.1 % (ref 36.0–46.0)
Hemoglobin: 13.3 g/dL (ref 12.0–15.0)
LYMPHS ABS: 1.3 10*3/uL (ref 0.7–4.0)
LYMPHS PCT: 20 % (ref 12–46)
MCH: 30.3 pg (ref 26.0–34.0)
MCHC: 33.2 g/dL (ref 30.0–36.0)
MCV: 91.3 fL (ref 78.0–100.0)
MONO ABS: 0.2 10*3/uL (ref 0.1–1.0)
Monocytes Relative: 4 % (ref 3–12)
Neutro Abs: 4.9 10*3/uL (ref 1.7–7.7)
Neutrophils Relative %: 76 % (ref 43–77)
Platelets: 121 10*3/uL — ABNORMAL LOW (ref 150–400)
RBC: 4.39 MIL/uL (ref 3.87–5.11)
RDW: 13.6 % (ref 11.5–15.5)
WBC: 6.5 10*3/uL (ref 4.0–10.5)

## 2013-07-14 LAB — TROPONIN I: Troponin I: <0.3 ng/mL (ref <0.30)

## 2013-07-14 NOTE — ED Notes (Signed)
Pt states that she is here for SOB. Pt states that the last time she had this type of SOB she was diagnosed with pneumonia. Pt states slight CP as well that is when she takes a deep breath.

## 2013-07-15 ENCOUNTER — Emergency Department (HOSPITAL_COMMUNITY)
Admission: EM | Admit: 2013-07-15 | Discharge: 2013-07-15 | Disposition: A | Discharge status: Home / Self Care | Payer: Medicare Other | Attending: Emergency Medicine | Admitting: Emergency Medicine

## 2013-07-15 DIAGNOSIS — J441 Chronic obstructive pulmonary disease with (acute) exacerbation: Secondary | ICD-10-CM

## 2013-07-15 LAB — I-STAT TROPONIN, ED: TROPONIN I, POC: 0.01 ng/mL (ref 0.00–0.08)

## 2013-07-15 LAB — PRO B NATRIURETIC PEPTIDE: Pro B Natriuretic peptide (BNP): 40.7 pg/mL (ref 0–125)

## 2013-07-15 MED ORDER — FENTANYL CITRATE 0.05 MG/ML IJ SOLN
50.0000 ug | Freq: Once | INTRAMUSCULAR | Status: DC
  Filled 2013-07-15: qty 2

## 2013-07-15 MED ORDER — IPRATROPIUM-ALBUTEROL 0.5-2.5 (3) MG/3ML IN SOLN
3.0000 mL | Freq: Once | RESPIRATORY_TRACT | Status: AC
  Administered 2013-07-15: 3 mL via RESPIRATORY_TRACT
  Filled 2013-07-15: qty 3

## 2013-07-15 MED ORDER — NITROGLYCERIN 0.4 MG SL SUBL: 0.4000 mg | SUBLINGUAL_TABLET | SUBLINGUAL | Status: DC | PRN

## 2013-07-15 MED ORDER — ASPIRIN 325 MG PO TABS
325.0000 mg | ORAL_TABLET | Freq: Once | ORAL | Status: AC
  Administered 2013-07-15: 325 mg via ORAL
  Filled 2013-07-15: qty 1

## 2013-07-15 MED ORDER — PREDNISONE 20 MG PO TABS: 40.0000 mg | ORAL_TABLET | Freq: Every day | ORAL | Status: DC

## 2013-07-15 MED ORDER — IPRATROPIUM-ALBUTEROL 0.5-2.5 (3) MG/3ML IN SOLN: 3.0000 mL | Freq: Once | RESPIRATORY_TRACT | Status: DC

## 2013-07-15 MED ORDER — PREDNISONE 20 MG PO TABS
60.0000 mg | ORAL_TABLET | Freq: Once | ORAL | Status: AC
  Administered 2013-07-15: 60 mg via ORAL
  Filled 2013-07-15: qty 3

## 2013-07-15 MED ORDER — SODIUM CHLORIDE 0.9 % IV BOLUS (SEPSIS): 500.0000 mL | Freq: Once | INTRAVENOUS | Status: DC

## 2013-07-15 NOTE — Discharge Instructions (Signed)
Chronic Obstructive Pulmonary Disease Exacerbation Chronic obstructive pulmonary disease (COPD) is a common lung condition in which airflow from the lungs is limited. COPD is a general term that can be used to describe many different lung problems that limit airflow, including chronic bronchitis and emphysema. COPD exacerbations are episodes when breathing symptoms become much worse and require extra treatment. Without treatment, COPD exacerbations can be life threatening, and frequent COPD exacerbations can cause further damage to your lungs. CAUSES   Respiratory infections.   Exposure to smoke.   Exposure to air pollution, chemical fumes, or dust. Sometimes there is no apparent cause or trigger. RISK FACTORS  Smoking cigarettes.  Older age.  Frequent prior COPD exacerbations. SIGNS AND SYMPTOMS   Increased coughing.   Increased thick spit (sputum) production.   Increased wheezing.   Increased shortness of breath.   Rapid breathing.   Chest tightness. DIAGNOSIS  Your medical history, a physical exam, and tests will help your health care provider make a diagnosis. Tests may include:  A chest X-ray.  Basic lab tests.  Sputum testing.  An arterial blood gas test. TREATMENT  Depending on the severity of your COPD exacerbation, you may need to be admitted to a hospital for treatment. Some of the treatments commonly used to treat COPD exacerbations are:   Antibiotic medicines.   Bronchodilators. These are drugs that expand the air passages. They may be given with an inhaler or nebulizer. Spacer devices may be needed to help improve drug delivery.  Corticosteroid medicines.  Supplemental oxygen therapy.  HOME CARE INSTRUCTIONS   Do not smoke. Quitting smoking is very important to prevent COPD from getting worse and exacerbations from happening as often.  Avoid exposure to all substances that irritate the airway, especially to tobacco smoke.   If prescribed,  take your antibiotics as directed. Finish them even if you start to feel better.  Only take over-the-counter or prescription medicines as directed by your health care provider.It is important to use correct technique with inhaled medicines.  Drink enough fluids to keep your urine clear or pale yellow (unless you have a medical condition that requires fluid restriction).  Use a cool mist vaporizer. This makes it easier to clear your chest when you cough.   If you have a home nebulizer and oxygen, continue to use them as directed.   Maintain all necessary vaccinations to prevent infections.   Exercise regularly.   Eat a healthy diet.   Keep all follow-up appointments as directed by your health care provider. SEEK IMMEDIATE MEDICAL CARE IF:  You have worsening shortness of breath.   You have trouble talking.   You have severe chest pain.  You have blood in your sputum.  You have a fever.  You have weakness, vomit repeatedly, or faint.   You feel confused.   You continue to get worse. MAKE SURE YOU:   Understand these instructions.  Will watch your condition.  Will get help right away if you are not doing well or get worse. Document Released: 11/06/2006 Document Revised: 10/30/2012 Document Reviewed: 09/13/2012 [facility] Patient Information 2015 [place], [place]. This information is not intended to replace advice given to you by your health care provider. Make sure you discuss any questions you have with your health care provider.

## 2013-07-15 NOTE — ED Provider Notes (Signed)
CSN: [id number]     Arrival date & time 07/14/13  2054 History   First MD Initiated Contact with Patient 07/15/13 [phone]     Chief Complaint  Patient presents with  . Shortness of Breath     (Consider location/radiation/quality/duration/timing/severity/associated sxs/prior Treatment) Patient is a 58 y.o. female presenting with shortness of breath. The history is provided by the patient.  Shortness of Breath Severity:  Moderate Onset quality:  Sudden Timing:  Constant Progression:  Unchanged Chronicity:  Recurrent Context: activity   Context: not URI   Relieved by:  Nothing Worsened by:  Activity Ineffective treatments:  None tried Associated symptoms: chest pain (tightness) and cough   Associated symptoms: no abdominal pain, no ear pain, no fever, no hemoptysis, no syncope and no vomiting     Past Medical History  Diagnosis Date  . Hypertension   . COPD (chronic obstructive pulmonary disease)   . Renal insufficiency   . Cirrhosis   . Pulmonary emboli   . Pneumonia    Past Surgical History  Procedure Laterality Date  . Abdominal hysterectomy     Family History  Problem Relation Age of Onset  . Stroke Mother   . Lung cancer Father    History  Substance Use Topics  . Smoking status: Former Smoker -- 1.00 packs/day    Types: Cigarettes  . Smokeless tobacco: Not on file  . Alcohol Use: No     Comment: former drinker   OB History   Grav Para Term Preterm Abortions TAB SAB Ect Mult Living                 Review of Systems  Constitutional: Negative for fever.  HENT: Negative for ear pain.   Respiratory: Positive for cough and shortness of breath. Negative for hemoptysis.   Cardiovascular: Positive for chest pain (tightness). Negative for syncope.  Gastrointestinal: Negative for vomiting and abdominal pain.  All other systems reviewed and are negative.     Allergies  Duloxetine; Gabapentin; and Neomycin-bacitracin zn-polymyx  Home Medications   Prior to  Admission medications   Medication Sig Start Date End Date Taking? Authorizing Provider  albuterol (PROVENTIL HFA;VENTOLIN HFA) 108 (90 BASE) MCG/ACT inhaler Inhale 2 puffs into the lungs every 6 (six) hours as needed. For shortness of breath    Yes Historical Provider, MD  ALPRAZolam Duanne Moron) 1 MG tablet Take 1 mg by mouth 3 (three) times daily as needed. For anxiety   Yes Historical Provider, MD  amitriptyline (ELAVIL) 50 MG tablet Take 100 mg by mouth at bedtime.    Yes Historical Provider, MD  budesonide-formoterol (SYMBICORT) 160-4.5 MCG/ACT inhaler Inhale 2 puffs into the lungs 2 (two) times daily.     Yes Historical Provider, MD  entecavir (BARACLUDE) 0.5 MG tablet Take 0.5 mg by mouth every other day.    Yes Historical Provider, MD  meloxicam (MOBIC) 15 MG tablet Take 15 mg by mouth daily.  05/01/13  Yes Historical Provider, MD  Multiple Vitamin (MULTIVITAMIN WITH MINERALS) TABS tablet Take 1 tablet by mouth daily.   Yes Historical Provider, MD  omeprazole (PRILOSEC) 40 MG capsule Take 80 mg by mouth daily.    Yes Historical Provider, MD  spironolactone (ALDACTONE) 25 MG tablet Take 25 mg by mouth 2 (two) times daily.     Yes Historical Provider, MD  temazepam (RESTORIL) 30 MG capsule Take 30 mg by mouth at bedtime.  06/11/13  Yes Historical Provider, MD  tenofovir (VIREAD) 300 MG tablet Take 300 mg  by mouth every other day.   Yes Historical Provider, MD  tiotropium (SPIRIVA) 18 MCG inhalation capsule Place 18 mcg into inhaler and inhale daily.     Yes Historical Provider, MD  traMADol (ULTRAM) 50 MG tablet Take 50 mg by mouth 2 (two) times daily.  06/18/13  Yes Historical Provider, MD   BP 124/84  Pulse 69  Temp(Src) 97.5 F (36.4 C) (Oral)  Resp 21  Ht 5\' 4"  (1.626 m)  Wt 210 lb (95.255 kg)  BMI 36.03 kg/m2  SpO2 94% Physical Exam  Nursing note and vitals reviewed. Constitutional: She is oriented to person, place, and time. She appears well-developed and well-nourished. No distress.   HENT:  Head: Normocephalic and atraumatic.  Mouth/Throat: Oropharynx is clear and moist.  Eyes: EOM are normal. Pupils are equal, round, and reactive to light.  Neck: Normal range of motion. Neck supple.  Cardiovascular: Normal rate and regular rhythm.  Exam reveals no friction rub.   No murmur heard. Pulmonary/Chest: Effort normal. No respiratory distress. She has wheezes (diffuse). She has no rales.  Abdominal: Soft. She exhibits no distension. There is no tenderness. There is no rebound.  Musculoskeletal: Normal range of motion. She exhibits no edema.  Neurological: She is alert and oriented to person, place, and time.  Skin: She is not diaphoretic.    ED Course  Procedures (including critical care time) Labs Review Labs Reviewed  CBC WITH DIFFERENTIAL - Abnormal; Notable for the following:    Platelets 121 (*)    All other components within normal limits  COMPREHENSIVE METABOLIC PANEL - Abnormal; Notable for the following:    BUN 37 (*)    Creatinine, Ser 2.22 (*)    GFR calc non Af Amer 24 (*)    GFR calc Af Amer 27 (*)    All other components within normal limits  TROPONIN I  PRO B NATRIURETIC PEPTIDE    Imaging Review Dg Chest 2 View  07/14/2013   CLINICAL DATA:  Shortness of breath and chest pain for 2 days. History of COPD and pneumonia.  EXAM: CHEST  2 VIEW  COMPARISON:  01/14/2013  FINDINGS: Slightly shallow inspiration with linear atelectasis or fibrosis in the lung bases. Infiltrate seen previously in the left lung base has resolved. No focal consolidation or airspace disease today. Degenerative changes in the spine.  IMPRESSION: Linear atelectasis or fibrosis in the lung bases. No evidence of active consolidation.   Electronically Signed   By: Lucienne Capers M.D.   On: 07/14/2013 21:51     EKG Interpretation   Date/Time:  Monday July 14 2013 21:09:01 EDT Ventricular Rate:  78 PR Interval:  166 QRS Duration: 110 QT Interval:  386 QTC Calculation: 440 R  Axis:   -30 Text Interpretation:  Normal sinus rhythm Left axis deviation Abnormal ECG  Similar to prior Confirmed by Mingo Amber  MD, [place] ([id number]) on 07/15/2013  3:56:52 AM      MDM   Final diagnoses:  COPD exacerbation    58 year old female presents with shortness of breath. Also complaining of pressure-like chest pain that does not radiate. Shortness of breath and chest pain are both worse with exertion. She is also concerned pressure-like chest pain at rest, stated that at this time. History of cirrhosis, hepatitis, pneumonia. No fevers, no productive cough. On exam lungs with some wheezing and rhonchi. Belly is benign. No peripheral edema. Labs show normal BNP. X-ray is negative for pneumonia.  CP relieved after breathing treatments. Sleeping comfortably. Given  steroids for COPD exacerbation, stable for discharge.    Osvaldo Shipper, MD 07/15/13 [phone]

## 2013-09-23 DIAGNOSIS — I2699 Other pulmonary embolism without acute cor pulmonale: Secondary | ICD-10-CM

## 2013-09-23 DIAGNOSIS — I82409 Acute embolism and thrombosis of unspecified deep veins of unspecified lower extremity: Secondary | ICD-10-CM

## 2013-09-23 HISTORY — DX: Other pulmonary embolism without acute cor pulmonale: I26.99

## 2013-09-23 HISTORY — DX: Acute embolism and thrombosis of unspecified deep veins of unspecified lower extremity: I82.409

## 2013-10-03 ENCOUNTER — Inpatient Hospital Stay (HOSPITAL_COMMUNITY)
Admission: EM | Admit: 2013-10-03 | Discharge: 2013-10-14 | DRG: 193 | Disposition: A | Discharge status: Skilled Nursing Facility | Payer: Medicare Other | Attending: Internal Medicine | Admitting: Internal Medicine

## 2013-10-03 ENCOUNTER — Encounter (HOSPITAL_COMMUNITY): Payer: Self-pay | Admitting: Emergency Medicine

## 2013-10-03 DIAGNOSIS — B1710 Acute hepatitis C without hepatic coma: Secondary | ICD-10-CM

## 2013-10-03 DIAGNOSIS — N183 Chronic kidney disease, stage 3 unspecified: Secondary | ICD-10-CM

## 2013-10-03 DIAGNOSIS — N200 Calculus of kidney: Secondary | ICD-10-CM

## 2013-10-03 DIAGNOSIS — N1832 Chronic kidney disease, stage 3b: Secondary | ICD-10-CM | POA: Diagnosis present

## 2013-10-03 DIAGNOSIS — D696 Thrombocytopenia, unspecified: Secondary | ICD-10-CM | POA: Diagnosis present

## 2013-10-03 DIAGNOSIS — E871 Hypo-osmolality and hyponatremia: Secondary | ICD-10-CM | POA: Diagnosis present

## 2013-10-03 DIAGNOSIS — J189 Pneumonia, unspecified organism: Secondary | ICD-10-CM

## 2013-10-03 DIAGNOSIS — J13 Pneumonia due to Streptococcus pneumoniae: Secondary | ICD-10-CM | POA: Diagnosis not present

## 2013-10-03 DIAGNOSIS — K5900 Constipation, unspecified: Secondary | ICD-10-CM | POA: Diagnosis present

## 2013-10-03 DIAGNOSIS — K7460 Unspecified cirrhosis of liver: Secondary | ICD-10-CM

## 2013-10-03 DIAGNOSIS — R911 Solitary pulmonary nodule: Secondary | ICD-10-CM

## 2013-10-03 DIAGNOSIS — I959 Hypotension, unspecified: Secondary | ICD-10-CM | POA: Diagnosis not present

## 2013-10-03 DIAGNOSIS — J449 Chronic obstructive pulmonary disease, unspecified: Secondary | ICD-10-CM

## 2013-10-03 DIAGNOSIS — Z720 Tobacco use: Secondary | ICD-10-CM

## 2013-10-03 DIAGNOSIS — E861 Hypovolemia: Secondary | ICD-10-CM | POA: Diagnosis present

## 2013-10-03 DIAGNOSIS — I10 Essential (primary) hypertension: Secondary | ICD-10-CM

## 2013-10-03 DIAGNOSIS — B1920 Unspecified viral hepatitis C without hepatic coma: Secondary | ICD-10-CM | POA: Diagnosis present

## 2013-10-03 DIAGNOSIS — F17200 Nicotine dependence, unspecified, uncomplicated: Secondary | ICD-10-CM

## 2013-10-03 DIAGNOSIS — I2699 Other pulmonary embolism without acute cor pulmonale: Secondary | ICD-10-CM

## 2013-10-03 DIAGNOSIS — Z0000 Encounter for general adult medical examination without abnormal findings: Secondary | ICD-10-CM

## 2013-10-03 DIAGNOSIS — Z21 Asymptomatic human immunodeficiency virus [HIV] infection status: Secondary | ICD-10-CM

## 2013-10-03 DIAGNOSIS — I82409 Acute embolism and thrombosis of unspecified deep veins of unspecified lower extremity: Secondary | ICD-10-CM

## 2013-10-03 DIAGNOSIS — T380X5A Adverse effect of glucocorticoids and synthetic analogues, initial encounter: Secondary | ICD-10-CM

## 2013-10-03 DIAGNOSIS — IMO0001 Reserved for inherently not codable concepts without codable children: Secondary | ICD-10-CM

## 2013-10-03 DIAGNOSIS — I129 Hypertensive chronic kidney disease with stage 1 through stage 4 chronic kidney disease, or unspecified chronic kidney disease: Secondary | ICD-10-CM | POA: Diagnosis present

## 2013-10-03 DIAGNOSIS — J9600 Acute respiratory failure, unspecified whether with hypoxia or hypercapnia: Secondary | ICD-10-CM | POA: Diagnosis present

## 2013-10-03 DIAGNOSIS — Z86711 Personal history of pulmonary embolism: Secondary | ICD-10-CM

## 2013-10-03 DIAGNOSIS — B1910 Unspecified viral hepatitis B without hepatic coma: Secondary | ICD-10-CM | POA: Diagnosis present

## 2013-10-03 DIAGNOSIS — R739 Hyperglycemia, unspecified: Secondary | ICD-10-CM

## 2013-10-03 DIAGNOSIS — F411 Generalized anxiety disorder: Secondary | ICD-10-CM

## 2013-10-03 DIAGNOSIS — B20 Human immunodeficiency virus [HIV] disease: Secondary | ICD-10-CM

## 2013-10-03 DIAGNOSIS — J4489 Other specified chronic obstructive pulmonary disease: Secondary | ICD-10-CM

## 2013-10-03 LAB — CBC WITH DIFFERENTIAL/PLATELET
BASOS ABS: 0 10*3/uL (ref 0.0–0.1)
Basophils Relative: 0 % (ref 0–1)
Eosinophils Absolute: 0 10*3/uL (ref 0.0–0.7)
Eosinophils Relative: 0 % (ref 0–5)
HEMATOCRIT: 39.9 % (ref 36.0–46.0)
Hemoglobin: 13.5 g/dL (ref 12.0–15.0)
Lymphocytes Relative: 20 % (ref 12–46)
Lymphs Abs: 1.2 10*3/uL (ref 0.7–4.0)
MCH: 30.3 pg (ref 26.0–34.0)
MCHC: 33.8 g/dL (ref 30.0–36.0)
MCV: 89.7 fL (ref 78.0–100.0)
MONO ABS: 0.4 10*3/uL (ref 0.1–1.0)
Monocytes Relative: 7 % (ref 3–12)
Neutro Abs: 4.3 10*3/uL (ref 1.7–7.7)
Neutrophils Relative %: 73 % (ref 43–77)
Platelets: 138 10*3/uL — ABNORMAL LOW (ref 150–400)
RBC: 4.45 MIL/uL (ref 3.87–5.11)
RDW: 12.6 % (ref 11.5–15.5)
WBC: 5.9 10*3/uL (ref 4.0–10.5)

## 2013-10-03 LAB — COMPREHENSIVE METABOLIC PANEL
ALT: 18 U/L (ref 0–35)
AST: 18 U/L (ref 0–37)
Albumin: 3.8 g/dL (ref 3.5–5.2)
Alkaline Phosphatase: 94 U/L (ref 39–117)
Anion gap: 17 — ABNORMAL HIGH (ref 5–15)
BUN: 15 mg/dL (ref 6–23)
CALCIUM: 9.6 mg/dL (ref 8.4–10.5)
CO2: 23 meq/L (ref 19–32)
CREATININE: 1.25 mg/dL — AB (ref 0.50–1.10)
Chloride: 100 mEq/L (ref 96–112)
GFR calc non Af Amer: 47 mL/min — ABNORMAL LOW (ref >90)
GFR, EST AFRICAN AMERICAN: 55 mL/min — AB (ref >90)
Glucose, Bld: 91 mg/dL (ref 70–99)
Potassium: 3.8 mEq/L (ref 3.7–5.3)
Sodium: 140 mEq/L (ref 137–147)
Total Bilirubin: 0.5 mg/dL (ref 0.3–1.2)
Total Protein: 7.6 g/dL (ref 6.0–8.3)

## 2013-10-03 MED ORDER — FENTANYL CITRATE 0.05 MG/ML IJ SOLN
50.0000 ug | Freq: Once | INTRAMUSCULAR | Status: AC
  Administered 2013-10-03: 50 ug via INTRAVENOUS
  Filled 2013-10-03: qty 2

## 2013-10-03 NOTE — ED Notes (Signed)
Patient arrives with complaint of right flank pain which she states started yesterday. Denies injury, fever, urinary symptoms, nausea, vomiting. Palpation of site increases pain per patient. Pain was initially mild but has gotten much worse today.

## 2013-10-04 ENCOUNTER — Inpatient Hospital Stay (HOSPITAL_COMMUNITY): Payer: Medicare Other

## 2013-10-04 ENCOUNTER — Observation Stay (HOSPITAL_COMMUNITY): Payer: Medicare Other

## 2013-10-04 ENCOUNTER — Emergency Department (HOSPITAL_COMMUNITY): Payer: Medicare Other

## 2013-10-04 DIAGNOSIS — I2699 Other pulmonary embolism without acute cor pulmonale: Secondary | ICD-10-CM | POA: Diagnosis not present

## 2013-10-04 DIAGNOSIS — J9600 Acute respiratory failure, unspecified whether with hypoxia or hypercapnia: Secondary | ICD-10-CM | POA: Diagnosis present

## 2013-10-04 DIAGNOSIS — E861 Hypovolemia: Secondary | ICD-10-CM | POA: Diagnosis present

## 2013-10-04 DIAGNOSIS — I129 Hypertensive chronic kidney disease with stage 1 through stage 4 chronic kidney disease, or unspecified chronic kidney disease: Secondary | ICD-10-CM | POA: Diagnosis present

## 2013-10-04 DIAGNOSIS — N200 Calculus of kidney: Secondary | ICD-10-CM | POA: Diagnosis present

## 2013-10-04 DIAGNOSIS — Z720 Tobacco use: Secondary | ICD-10-CM

## 2013-10-04 DIAGNOSIS — N183 Chronic kidney disease, stage 3 unspecified: Secondary | ICD-10-CM | POA: Diagnosis present

## 2013-10-04 DIAGNOSIS — B1910 Unspecified viral hepatitis B without hepatic coma: Secondary | ICD-10-CM | POA: Diagnosis present

## 2013-10-04 DIAGNOSIS — Z21 Asymptomatic human immunodeficiency virus [HIV] infection status: Secondary | ICD-10-CM

## 2013-10-04 DIAGNOSIS — I959 Hypotension, unspecified: Secondary | ICD-10-CM | POA: Diagnosis not present

## 2013-10-04 DIAGNOSIS — D696 Thrombocytopenia, unspecified: Secondary | ICD-10-CM | POA: Diagnosis present

## 2013-10-04 DIAGNOSIS — J449 Chronic obstructive pulmonary disease, unspecified: Secondary | ICD-10-CM | POA: Diagnosis present

## 2013-10-04 DIAGNOSIS — F17200 Nicotine dependence, unspecified, uncomplicated: Secondary | ICD-10-CM | POA: Diagnosis present

## 2013-10-04 DIAGNOSIS — B1920 Unspecified viral hepatitis C without hepatic coma: Secondary | ICD-10-CM | POA: Diagnosis present

## 2013-10-04 DIAGNOSIS — K5900 Constipation, unspecified: Secondary | ICD-10-CM | POA: Diagnosis present

## 2013-10-04 DIAGNOSIS — Z86711 Personal history of pulmonary embolism: Secondary | ICD-10-CM | POA: Diagnosis not present

## 2013-10-04 DIAGNOSIS — B20 Human immunodeficiency virus [HIV] disease: Secondary | ICD-10-CM | POA: Insufficient documentation

## 2013-10-04 DIAGNOSIS — J189 Pneumonia, unspecified organism: Secondary | ICD-10-CM | POA: Diagnosis present

## 2013-10-04 DIAGNOSIS — J13 Pneumonia due to Streptococcus pneumoniae: Secondary | ICD-10-CM | POA: Diagnosis present

## 2013-10-04 DIAGNOSIS — E871 Hypo-osmolality and hyponatremia: Secondary | ICD-10-CM | POA: Diagnosis present

## 2013-10-04 DIAGNOSIS — I82409 Acute embolism and thrombosis of unspecified deep veins of unspecified lower extremity: Secondary | ICD-10-CM | POA: Diagnosis not present

## 2013-10-04 DIAGNOSIS — K7460 Unspecified cirrhosis of liver: Secondary | ICD-10-CM | POA: Diagnosis present

## 2013-10-04 LAB — CBC WITH DIFFERENTIAL/PLATELET
BASOS ABS: 0 10*3/uL (ref 0.0–0.1)
BASOS PCT: 0 % (ref 0–1)
Eosinophils Absolute: 0 10*3/uL (ref 0.0–0.7)
Eosinophils Relative: 0 % (ref 0–5)
HCT: 37.2 % (ref 36.0–46.0)
HEMOGLOBIN: 12.4 g/dL (ref 12.0–15.0)
Lymphocytes Relative: 26 % (ref 12–46)
Lymphs Abs: 1.2 10*3/uL (ref 0.7–4.0)
MCH: 30.2 pg (ref 26.0–34.0)
MCHC: 33.3 g/dL (ref 30.0–36.0)
MCV: 90.7 fL (ref 78.0–100.0)
MONOS PCT: 8 % (ref 3–12)
Monocytes Absolute: 0.4 10*3/uL (ref 0.1–1.0)
NEUTROS ABS: 2.9 10*3/uL (ref 1.7–7.7)
Neutrophils Relative %: 66 % (ref 43–77)
Platelets: 121 10*3/uL — ABNORMAL LOW (ref 150–400)
RBC: 4.1 MIL/uL (ref 3.87–5.11)
RDW: 12.9 % (ref 11.5–15.5)
WBC: 4.5 10*3/uL (ref 4.0–10.5)

## 2013-10-04 LAB — LACTIC ACID, PLASMA: Lactic Acid, Venous: 0.9 mmol/L (ref 0.5–2.2)

## 2013-10-04 LAB — BLOOD GAS, ARTERIAL
Acid-base deficit: 0.8 mmol/L (ref 0.0–2.0)
BICARBONATE: 23.4 meq/L (ref 20.0–24.0)
DRAWN BY: 330991
FIO2: 100 %
O2 SAT: 90.1 %
PATIENT TEMPERATURE: 98.6
TCO2: 24.6 mmol/L (ref 0–100)
pCO2 arterial: 39.3 mmHg (ref 35.0–45.0)
pH, Arterial: 7.393 (ref 7.350–7.450)
pO2, Arterial: 60.2 mmHg — ABNORMAL LOW (ref 80.0–100.0)

## 2013-10-04 LAB — BASIC METABOLIC PANEL
ANION GAP: 13 (ref 5–15)
BUN: 14 mg/dL (ref 6–23)
CHLORIDE: 104 meq/L (ref 96–112)
CO2: 23 mEq/L (ref 19–32)
CREATININE: 1.16 mg/dL — AB (ref 0.50–1.10)
Calcium: 9.1 mg/dL (ref 8.4–10.5)
GFR calc non Af Amer: 52 mL/min — ABNORMAL LOW (ref >90)
GFR, EST AFRICAN AMERICAN: 60 mL/min — AB (ref >90)
Glucose, Bld: 90 mg/dL (ref 70–99)
POTASSIUM: 4 meq/L (ref 3.7–5.3)
SODIUM: 140 meq/L (ref 137–147)

## 2013-10-04 LAB — LIPASE, BLOOD: LIPASE: 33 U/L (ref 11–59)

## 2013-10-04 LAB — MRSA PCR SCREENING: MRSA BY PCR: NEGATIVE

## 2013-10-04 LAB — TROPONIN I: Troponin I: <0.3 ng/mL (ref <0.30)

## 2013-10-04 MED ORDER — MORPHINE SULFATE 2 MG/ML IJ SOLN
1.0000 mg | INTRAMUSCULAR | Status: DC | PRN
  Administered 2013-10-04: 1 mg via INTRAVENOUS
  Filled 2013-10-04: qty 1

## 2013-10-04 MED ORDER — NICOTINE 7 MG/24HR TD PT24
7.0000 mg | MEDICATED_PATCH | Freq: Every day | TRANSDERMAL | Status: DC
  Administered 2013-10-04 – 2013-10-14 (×12): 7 mg via TRANSDERMAL
  Filled 2013-10-04 (×14): qty 1

## 2013-10-04 MED ORDER — SPIRONOLACTONE 25 MG PO TABS
25.0000 mg | ORAL_TABLET | Freq: Two times a day (BID) | ORAL | Status: DC
  Administered 2013-10-04 – 2013-10-08 (×10): 25 mg via ORAL
  Filled 2013-10-04 (×11): qty 1

## 2013-10-04 MED ORDER — ALPRAZOLAM 0.5 MG PO TABS
1.0000 mg | ORAL_TABLET | Freq: Three times a day (TID) | ORAL | Status: DC | PRN
  Administered 2013-10-05 – 2013-10-14 (×21): 1 mg via ORAL
  Filled 2013-10-04 (×22): qty 2

## 2013-10-04 MED ORDER — AZITHROMYCIN 500 MG PO TABS
500.0000 mg | ORAL_TABLET | ORAL | Status: DC
  Administered 2013-10-04 – 2013-10-07 (×4): 500 mg via ORAL
  Filled 2013-10-04 (×5): qty 1

## 2013-10-04 MED ORDER — ILOPERIDONE 6 MG PO TABS
1.0000 | ORAL_TABLET | Freq: Two times a day (BID) | ORAL | Status: DC
  Administered 2013-10-07: 6 mg via ORAL

## 2013-10-04 MED ORDER — METHYLPREDNISOLONE SODIUM SUCC 40 MG IJ SOLR
40.0000 mg | Freq: Four times a day (QID) | INTRAMUSCULAR | Status: DC
  Administered 2013-10-04 – 2013-10-05 (×4): 40 mg via INTRAVENOUS
  Filled 2013-10-04 (×8): qty 1

## 2013-10-04 MED ORDER — AMITRIPTYLINE HCL 25 MG PO TABS
100.0000 mg | ORAL_TABLET | Freq: Every day | ORAL | Status: DC
  Administered 2013-10-04 – 2013-10-13 (×10): 100 mg via ORAL
  Filled 2013-10-04 (×4): qty 4
  Filled 2013-10-04 (×3): qty 1
  Filled 2013-10-04: qty 4
  Filled 2013-10-04: qty 1
  Filled 2013-10-04 (×2): qty 4

## 2013-10-04 MED ORDER — MORPHINE SULFATE 4 MG/ML IJ SOLN
6.0000 mg | Freq: Once | INTRAMUSCULAR | Status: AC
  Administered 2013-10-04: 4 mg via INTRAVENOUS
  Filled 2013-10-04: qty 2

## 2013-10-04 MED ORDER — DEXTROSE 5 % IV SOLN
2.0000 g | Freq: Once | INTRAVENOUS | Status: AC
  Administered 2013-10-04: 2 g via INTRAVENOUS
  Filled 2013-10-04: qty 2

## 2013-10-04 MED ORDER — PANTOPRAZOLE SODIUM 40 MG PO TBEC
40.0000 mg | DELAYED_RELEASE_TABLET | Freq: Every day | ORAL | Status: DC
  Administered 2013-10-04 – 2013-10-14 (×11): 40 mg via ORAL
  Filled 2013-10-04 (×11): qty 1

## 2013-10-04 MED ORDER — TRAMADOL HCL 50 MG PO TABS
50.0000 mg | ORAL_TABLET | Freq: Two times a day (BID) | ORAL | Status: DC
  Administered 2013-10-04 – 2013-10-14 (×21): 50 mg via ORAL
  Filled 2013-10-04 (×22): qty 1

## 2013-10-04 MED ORDER — TENOFOVIR DISOPROXIL FUMARATE 300 MG PO TABS
300.0000 mg | ORAL_TABLET | ORAL | Status: DC
  Administered 2013-10-05 – 2013-10-13 (×5): 300 mg via ORAL
  Filled 2013-10-04 (×6): qty 1

## 2013-10-04 MED ORDER — SODIUM CHLORIDE 0.9 % IV SOLN
INTRAVENOUS | Status: DC
  Administered 2013-10-04: 04:00:00 via INTRAVENOUS

## 2013-10-04 MED ORDER — ALBUTEROL SULFATE (2.5 MG/3ML) 0.083% IN NEBU
3.0000 mL | INHALATION_SOLUTION | Freq: Four times a day (QID) | RESPIRATORY_TRACT | Status: DC | PRN
  Administered 2013-10-04: 3 mL via RESPIRATORY_TRACT
  Filled 2013-10-04: qty 3

## 2013-10-04 MED ORDER — HEPARIN SODIUM (PORCINE) 5000 UNIT/ML IJ SOLN
5000.0000 [IU] | Freq: Three times a day (TID) | INTRAMUSCULAR | Status: DC
  Administered 2013-10-04 – 2013-10-08 (×13): 5000 [IU] via SUBCUTANEOUS
  Filled 2013-10-04 (×13): qty 1

## 2013-10-04 MED ORDER — METHYLPREDNISOLONE SODIUM SUCC 125 MG IJ SOLR
INTRAMUSCULAR | Status: AC
  Filled 2013-10-04: qty 2

## 2013-10-04 MED ORDER — ENTECAVIR 0.5 MG PO TABS: 0.5000 mg | ORAL_TABLET | ORAL | Status: DC

## 2013-10-04 MED ORDER — ALBUTEROL SULFATE (2.5 MG/3ML) 0.083% IN NEBU
3.0000 mL | INHALATION_SOLUTION | RESPIRATORY_TRACT | Status: DC | PRN
  Administered 2013-10-07 – 2013-10-10 (×4): 3 mL via RESPIRATORY_TRACT
  Filled 2013-10-04 (×5): qty 3

## 2013-10-04 MED ORDER — TIOTROPIUM BROMIDE MONOHYDRATE 18 MCG IN CAPS
18.0000 ug | ORAL_CAPSULE | Freq: Every day | RESPIRATORY_TRACT | Status: DC
  Administered 2013-10-04 – 2013-10-07 (×4): 18 ug via RESPIRATORY_TRACT
  Filled 2013-10-04 (×2): qty 5

## 2013-10-04 MED ORDER — BUDESONIDE-FORMOTEROL FUMARATE 160-4.5 MCG/ACT IN AERO
2.0000 | INHALATION_SPRAY | Freq: Two times a day (BID) | RESPIRATORY_TRACT | Status: DC
  Administered 2013-10-04 – 2013-10-14 (×21): 2 via RESPIRATORY_TRACT
  Filled 2013-10-04 (×2): qty 6

## 2013-10-04 MED ORDER — LIDOCAINE 5 % EX PTCH
1.0000 | MEDICATED_PATCH | Freq: Every day | CUTANEOUS | Status: DC
  Administered 2013-10-04 – 2013-10-08 (×5): 1 via TRANSDERMAL
  Filled 2013-10-04 (×8): qty 1

## 2013-10-04 MED ORDER — DEXTROSE 5 % IV SOLN
1.0000 g | INTRAVENOUS | Status: DC
  Administered 2013-10-05 – 2013-10-08 (×4): 1 g via INTRAVENOUS
  Filled 2013-10-04 (×5): qty 10

## 2013-10-04 MED ORDER — AZITHROMYCIN 250 MG PO TABS
500.0000 mg | ORAL_TABLET | Freq: Once | ORAL | Status: AC
  Administered 2013-10-04: 500 mg via ORAL
  Filled 2013-10-04: qty 2

## 2013-10-04 NOTE — Progress Notes (Signed)
Saw pt to administer scheduled inhalers. Pt coughing, somewhat SOB, O2 sats reading around 86% on 2.5L [place]. Pt had clear/diminished bbs with slight inspiratory wheeze. O2 increased to 4L [place] due to low sats. Pt given PRN Albuterol tx, and instructed on Flutter use. RT will continue to monitor.

## 2013-10-04 NOTE — Progress Notes (Signed)
Pt admitted to the unit. Pt is stable, falling asleep. Educated to call for any assistance. Bed in lowest position, call bell within reach- will continue to monitor.

## 2013-10-04 NOTE — Progress Notes (Signed)
Pt belongings gathered and transferred to stepdown

## 2013-10-04 NOTE — Progress Notes (Signed)
Pt oxygen levels decreasing throughout the day. Oxygen increased from 2-6L [place]  and pt stats maintained around 83%. Non-rebreather placed and pt stats 89-91%. Rapid response called and Vann MD notified.

## 2013-10-04 NOTE — Progress Notes (Addendum)
Patient admitted after midnight by Dr. Blaine Hamper.  Please see H&P.  Community-acquired pneumonia: -CT abdomen, patient has right basilar infiltrate, indicating pneumonia.  - ABx: ceftriaxone and azithromycin  - get CXR-2 view to evaluate the whole lung filed.  - blood culture x 2  - NS @ 75cc/hr  - Fever control  - Repeat CBC in am  - Sputum cultures  - Blood cultures X 2  - urine Legionella and strep antigen  -add steroids  hypertension: Blood pressure is controlled  -Continue home medications    COPD: Continue home inhaler medications   CKD-III: Baseline creatinine is about 1.2-1.7. His creatinine is 1.25 on admission, which is close to his baseline.  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 There is no obvious hepatic mass on CT abdomen. Currently seems to be compensated.  - monitor closely.     Eulogio Bear  Came back to eval patient- sats remaining low- will transfer to SDU with [facility] for now- cont abx, nebs, steroids

## 2013-10-04 NOTE — Significant Event (Signed)
Rapid Response Event Note  Overview: Time Called: 1214 Arrival Time: 1217 Event Type: Respiratory  Initial Focused Assessment:  Called by primary Rn for patient desatting and requiring increased oxygen throughout the day.  Upon my arrival patient on NRB sats 89-91%.  Patient lethargic but arousable to voice.  HR 78.  RR 18-20, does not appear to be in respiratory distress.  Patient does not voice any concerns   Interventions:  MD paged and notified, at bedside.  ABG done- results 7.39, CO2 39.3, O2 60.2, HCO3 23.4 sats 90%, results given to Dr. Eliseo Squires.  Orders for patient to go to SDU.     Event Summary :  RN to call if assistance needed   at      at          [facility], [place]

## 2013-10-04 NOTE — ED Provider Notes (Signed)
CSN: [id number]     Arrival date & time 10/03/13  2200 History   First MD Initiated Contact with Patient 10/04/13 0040     Chief Complaint  Patient presents with  . Flank Pain     (Consider location/radiation/quality/duration/timing/severity/associated sxs/prior Treatment) HPI  [name] is a 58 y.o. female with a past medical history of hypertension hepatitis B, cirrhosis presenting today with right-sided flank pain. His occurred sudden onset at 4 PM. Described as sharp with possible radiation to the right groin intermittently. Patient denies any fevers or urinary symptoms. The pain is worse with moving. Nothing she's done makes it better today. She denies ever having these symptoms in the past. She's had no nausea vomiting or diarrhea associated. Patient has had normal appetite. She denies any history of kidney stones in the past.    10 Systems reviewed and are negative for acute change except as noted in the HPI.    Past Medical History  Diagnosis Date  . Hypertension   . COPD (chronic obstructive pulmonary disease)   . Renal insufficiency   . Cirrhosis   . Pulmonary emboli   . Pneumonia    Past Surgical History  Procedure Laterality Date  . Abdominal hysterectomy     Family History  Problem Relation Age of Onset  . Stroke Mother   . Lung cancer Father    History  Substance Use Topics  . Smoking status: Former Smoker -- 1.00 packs/day    Types: Cigarettes  . Smokeless tobacco: Not on file  . Alcohol Use: No     Comment: former drinker   OB History   Grav Para Term Preterm Abortions TAB SAB Ect Mult Living                 Review of Systems    Allergies  Duloxetine; Gabapentin; and Neomycin-bacitracin zn-polymyx  Home Medications   Prior to Admission medications   Medication Sig Start Date End Date Taking? Authorizing Provider  albuterol (PROVENTIL HFA;VENTOLIN HFA) 108 (90 BASE) MCG/ACT inhaler Inhale 2 puffs into the lungs every 6 (six) hours as  needed. For shortness of breath    Yes Historical Provider, MD  ALPRAZolam Duanne Moron) 1 MG tablet Take 1 mg by mouth 3 (three) times daily as needed. For anxiety   Yes Historical Provider, MD  amitriptyline (ELAVIL) 50 MG tablet Take 100 mg by mouth at bedtime.    Yes Historical Provider, MD  budesonide-formoterol (SYMBICORT) 160-4.5 MCG/ACT inhaler Inhale 2 puffs into the lungs 2 (two) times daily.     Yes Historical Provider, MD  entecavir (BARACLUDE) 0.5 MG tablet Take 0.5 mg by mouth every other day.    Yes Historical Provider, MD  Iloperidone (FANAPT) 6 MG TABS Take 1 tablet by mouth 2 (two) times daily.   Yes Historical Provider, MD  meloxicam (MOBIC) 15 MG tablet Take 15 mg by mouth daily.  05/01/13  Yes Historical Provider, MD  omeprazole (PRILOSEC) 40 MG capsule Take 80 mg by mouth daily.    Yes Historical Provider, MD  spironolactone (ALDACTONE) 25 MG tablet Take 25 mg by mouth 2 (two) times daily.     Yes Historical Provider, MD  tenofovir (VIREAD) 300 MG tablet Take 300 mg by mouth every other day.   Yes Historical Provider, MD  tiotropium (SPIRIVA) 18 MCG inhalation capsule Place 18 mcg into inhaler and inhale daily.     Yes Historical Provider, MD  traMADol (ULTRAM) 50 MG tablet Take 50 mg by mouth  2 (two) times daily.  06/18/13  Yes Historical Provider, MD   BP 110/81  Pulse 89  Temp(Src) 97.8 F (36.6 C) (Oral)  Ht 5\' 4"  (1.626 m)  SpO2 96% Physical Exam  Nursing note and vitals reviewed. Constitutional: She is oriented to person, place, and time. She appears well-developed and well-nourished. No distress.  HENT:  Head: Normocephalic and atraumatic.  Nose: Nose normal.  Mouth/Throat: Oropharynx is clear and moist. No oropharyngeal exudate.  Eyes: Conjunctivae and EOM are normal. Pupils are equal, round, and reactive to light. No scleral icterus.  Neck: Normal range of motion. Neck supple. No JVD present. No tracheal deviation present. No thyromegaly present.  Cardiovascular:  Normal rate, regular rhythm and normal heart sounds.  Exam reveals no gallop and no friction rub.   No murmur heard. Pulmonary/Chest: Effort normal and breath sounds normal. No respiratory distress. She has no wheezes. She exhibits no tenderness.  Abdominal: Soft. Bowel sounds are normal. She exhibits distension. She exhibits no mass. There is no tenderness. There is no rebound and no guarding.  No CVA tenderness.  Musculoskeletal: Normal range of motion. She exhibits no edema and no tenderness.  Lymphadenopathy:    She has no cervical adenopathy.  Neurological: She is alert and oriented to person, place, and time. No cranial nerve deficit. She exhibits normal muscle tone.  Skin: Skin is warm and dry. No rash noted. She is not diaphoretic. No erythema. No pallor.    ED Course  Procedures (including critical care time) Labs Review Labs Reviewed  CBC WITH DIFFERENTIAL - Abnormal; Notable for the following:    Platelets 138 (*)    All other components within normal limits  COMPREHENSIVE METABOLIC PANEL - Abnormal; Notable for the following:    Creatinine, Ser 1.25 (*)    GFR calc non Af Amer 47 (*)    GFR calc Af Amer 55 (*)    Anion gap 17 (*)    All other components within normal limits  LACTIC ACID, PLASMA  LIPASE, BLOOD  URINALYSIS, ROUTINE W REFLEX MICROSCOPIC    Imaging Review Ct Renal Stone Study  10/04/2013   CLINICAL DATA:  Right flank pain.  EXAM: CT ABDOMEN AND PELVIS WITHOUT CONTRAST  TECHNIQUE: Multidetector CT imaging of the abdomen and pelvis was performed following the standard protocol without IV contrast.  COMPARISON:  09/16/2003.  FINDINGS: The lung bases demonstrate small effusions and a right basilar infiltrate. The heart is normal in size. No pericardial effusion  Advanced cirrhotic changes involving the liver without obvious hepatic mass. Gallbladder is normal. No common bile duct dilatation. The pancreas is grossly normal. The spleen is enlarged. No ascites. The  adrenal glands are normal. There are bilateral renal calculi but no obstructing ureteral calculi or bladder calculi.  The stomach, duodenum, small bowel and colon are grossly normal without oral contrast. No inflammatory changes, mass lesions or obstructive findings. No mesenteric or retroperitoneal mass or adenopathy. The appendix is normal.  No pelvic mass or adenopathy. The uterus is surgically absent. The bladder is normal. No inguinal mass or adenopathy.  The bony structures are unremarkable.  IMPRESSION: Right basilar infiltrate.  Advanced cirrhotic changes involving the liver without obvious hepatic mass. Mild associated splenomegaly.  Bilateral renal calculi but no obstructing ureteral calculi.   Electronically Signed   By: Kalman Jewels M.D.   On: 10/04/2013 01:55     EKG Interpretation None      MDM   Final diagnoses:  None  Patient is to emergency department a concern for right flank pain. Patient states a sudden onset and sharp. Will evaluate with laboratory studies and CT scan to evaluate for kidney stone.  CT scan as above reveals a right lower lobe infiltrate, and bilateral renal stones, none are obstructing. Upon repeat assessment the patient she was found to be 88% on room air. She does admit to a history of coughing but no fevers. She was given ceftriaxone azithromycin. She was placed on 2 L nasal cannula and now has oxygen saturation 93%. I am unsure of her baseline as she is a chronic smoker who continues to smoke despite her primary care physician telling her to stop. She'll be retained in the hospital under [facility].    Everlene Balls, MD 10/04/13 [phone]

## 2013-10-04 NOTE — Progress Notes (Signed)
Attempted to call report to stepdown unit. RN to call back

## 2013-10-04 NOTE — ED Notes (Signed)
Pt given 4 mg of Morphine rather than 6mg . Pt O2 sats 88%, pt reports increased shortness of breath, hx of COPD. Pt placed on 2 L via [place] sats at 92%. Pt resting at this time

## 2013-10-04 NOTE — H&P (Addendum)
[facility] History and Physical  [name] KGM:[id number] DOB: 1955/04/02 DOA: 10/03/2013  Referring physician: ED physician PCP: No PCP Per Patient  Specialists:   Chief Complaint: Left chest pain and a right back pain  HPI: [name] is a 58 y.o. female with past medical history of COPD, cirrhosis, pulmonary embolism, HIV, hypertension, hepatitis B, who presents with right sided back pain and left sided chest pain.  Patient reported that her back pain suddenly started at about 4 PM when she was watching TV. His back pain is located at the right lower back, constant, 10 out of 10 in severity, stabing-like pain, nonradiating. It is not aggravated or alleviated with any known factors. Patient denies any fevers or urinary symptoms. She denies ever having these symptoms in the past. She's had no nausea, vomiting or diarrhea. She denies any history of kidney stones in the past.    Patient also reports having left-sided chest pain. It has been going on for several days. The pain is constant, sharp, nonradiating. It is associated with dry cough. It is pleruritic. It is aggravated by deep breath. She does not have fever or chills. No recent cold-like symptoms, such as runny nose, sore throat. she reports that she has baseline shortness of breath because of COPD, which has not been worsening. He has a long history of smoking, still smokes one pack a day. She was found to be desaturated to 88% in ED, which quickly comes back to above 90% on 2 L of oxygen. Of note, patient has history of pulmonary embolism. She completed 1-year Coumadin treatment 6 months ago. Currently she does not have any pain over calf areas. No recent history of long distance traveling.   Because of concerning for kidney stone, CT abdomen was done in Ed, which showed bilateral kidney stones without obstruction and right basilar infiltration.   Review of Systems: As presented in the history of presenting illness, rest  negative.  Where does patient live?  Lives with her daughter in [place] Can patient participate in ADLs? Yes   Allergy:  Allergies  Allergen Reactions  . Duloxetine Other (See Comments)    REACTION: Headache  . Gabapentin     REACTION: rash in mouth  . Neomycin-Bacitracin Zn-Polymyx     REACTION: rash/hives    Past Medical History  Diagnosis Date  . Hypertension   . COPD (chronic obstructive pulmonary disease)   . Renal insufficiency   . Cirrhosis   . Pulmonary emboli   . Pneumonia     Past Surgical History  Procedure Laterality Date  . Abdominal hysterectomy      Social History:  reports that she has quit smoking. Her smoking use included Cigarettes. She smoked 1.00 pack per day. She does not have any smokeless tobacco history on file. She reports that she does not drink alcohol or use illicit drugs.  Family History:  Family History  Problem Relation Age of Onset  . Stroke Mother   . Lung cancer Father      Prior to Admission medications   Medication Sig Start Date End Date Taking? Authorizing Provider  albuterol (PROVENTIL HFA;VENTOLIN HFA) 108 (90 BASE) MCG/ACT inhaler Inhale 2 puffs into the lungs every 6 (six) hours as needed. For shortness of breath    Yes Historical Provider, MD  ALPRAZolam Duanne Moron) 1 MG tablet Take 1 mg by mouth 3 (three) times daily as needed. For anxiety   Yes Historical Provider, MD  amitriptyline (ELAVIL) 50 MG tablet  Take 100 mg by mouth at bedtime.    Yes Historical Provider, MD  budesonide-formoterol (SYMBICORT) 160-4.5 MCG/ACT inhaler Inhale 2 puffs into the lungs 2 (two) times daily.     Yes Historical Provider, MD  entecavir (BARACLUDE) 0.5 MG tablet Take 0.5 mg by mouth every other day.    Yes Historical Provider, MD  Iloperidone (FANAPT) 6 MG TABS Take 1 tablet by mouth 2 (two) times daily.   Yes Historical Provider, MD  meloxicam (MOBIC) 15 MG tablet Take 15 mg by mouth daily.  05/01/13  Yes Historical Provider, MD  omeprazole  (PRILOSEC) 40 MG capsule Take 80 mg by mouth daily.    Yes Historical Provider, MD  spironolactone (ALDACTONE) 25 MG tablet Take 25 mg by mouth 2 (two) times daily.     Yes Historical Provider, MD  tenofovir (VIREAD) 300 MG tablet Take 300 mg by mouth every other day.   Yes Historical Provider, MD  tiotropium (SPIRIVA) 18 MCG inhalation capsule Place 18 mcg into inhaler and inhale daily.     Yes Historical Provider, MD  traMADol (ULTRAM) 50 MG tablet Take 50 mg by mouth 2 (two) times daily.  06/18/13  Yes Historical Provider, MD    Physical Exam: Filed Vitals:   10/04/13 0215 10/04/13 0220 10/04/13 0403 10/04/13 0603  BP: 125/87 125/81 125/84 112/80  Fields: 68 69 70 70  Temp:  98.6 F (37 C) 98.6 F (37 C) 98.2 F (36.8 C)  TempSrc:  Oral Oral Oral  Resp: 28 18 20 17   Height:   5\' 4"  (1.626 m)   Weight:   92.7 kg (204 lb 5.9 oz)   SpO2: 92% 92% 91% 95%   General: Not in acute distress HEENT:       Eyes: PERRL, EOMI, no scleral icterus       ENT: No discharge from the ears and nose, no pharynx injection, no tonsillar enlargement.        Neck: No JVD, no bruit, no mass felt. Cardiac: S1/S2, RRR, No murmurs, gallops or rubs Pulm: Good air movement bilaterally. No rales, wheezing, rhonchi or rubs. Abd: Soft. Bowel sounds are normal. She exhibits distension. She exhibits no mass. There is no tenderness. There is no rebound and no guarding. No CVA tenderness.  Ext: No edema. 2+DP/PT Fields bilaterally Musculoskeletal: No joint deformities, erythema, or stiffness, ROM full Skin: No rashes.  Neuro: Alert and oriented X3, cranial nerves II-XII grossly intact, muscle strength 5/5 in all extremeties, sensation to light touch intact.  Psych: Patient is not psychotic, no suicidal or hemocidal ideation.  Labs on Admission:  Basic Metabolic Panel:  Recent Labs Lab 10/03/13 2249  NA 140  K 3.8  CL 100  CO2 23  GLUCOSE 91  BUN 15  CREATININE 1.25*  CALCIUM 9.6   Liver Function  Tests:  Recent Labs Lab 10/03/13 2249  AST 18  ALT 18  ALKPHOS 94  BILITOT 0.5  PROT 7.6  ALBUMIN 3.8    Recent Labs Lab 10/03/13 2249  LIPASE 33   No results found for this basename: AMMONIA,  in the last 168 hours CBC:  Recent Labs Lab 10/03/13 2249  WBC 5.9  NEUTROABS 4.3  HGB 13.5  HCT 39.9  MCV 89.7  PLT 138*   Cardiac Enzymes: No results found for this basename: CKTOTAL, CKMB, CKMBINDEX, TROPONINI,  in the last 168 hours  BNP (last 3 results)  Recent Labs  12/21/12 1630 07/14/13 2120  PROBNP 149.3* 40.7  CBG: No results found for this basename: GLUCAP,  in the last 168 hours  Radiological Exams on Admission: Dg Chest 2 View  10/04/2013   CLINICAL DATA:  Right flank pain.  EXAM: CHEST  2 VIEW  COMPARISON:  Chest radiograph performed 07/14/2013, and CT of the abdomen and pelvis performed earlier today at 1:40 a.m.  FINDINGS: The lungs are hypoexpanded. Minimal bibasilar opacities are better characterized on recent CT of the abdomen and pelvis, and raise concern for mild pneumonia. There is no evidence of pleural effusion or pneumothorax.  The heart is normal in size; the mediastinal contour is within normal limits. No acute osseous abnormalities are seen.  IMPRESSION: Lungs hypoexpanded. Minimal bibasilar opacities, better characterized on recent CT of the abdomen and pelvis, raise concern for mild pneumonia.   Electronically Signed   By: Garald Balding M.D.   On: 10/04/2013 03:53   Ct Renal Stone Study  10/04/2013   CLINICAL DATA:  Right flank pain.  EXAM: CT ABDOMEN AND PELVIS WITHOUT CONTRAST  TECHNIQUE: Multidetector CT imaging of the abdomen and pelvis was performed following the standard protocol without IV contrast.  COMPARISON:  09/16/2003.  FINDINGS: The lung bases demonstrate small effusions and a right basilar infiltrate. The heart is normal in size. No pericardial effusion  Advanced cirrhotic changes involving the liver without obvious hepatic mass.  Gallbladder is normal. No common bile duct dilatation. The pancreas is grossly normal. The spleen is enlarged. No ascites. The adrenal glands are normal. There are bilateral renal calculi but no obstructing ureteral calculi or bladder calculi.  The stomach, duodenum, small bowel and colon are grossly normal without oral contrast. No inflammatory changes, mass lesions or obstructive findings. No mesenteric or retroperitoneal mass or adenopathy. The appendix is normal.  No pelvic mass or adenopathy. The uterus is surgically absent. The bladder is normal. No inguinal mass or adenopathy.  The bony structures are unremarkable.  IMPRESSION: Right basilar infiltrate.  Advanced cirrhotic changes involving the liver without obvious hepatic mass. Mild associated splenomegaly.  Bilateral renal calculi but no obstructing ureteral calculi.   Electronically Signed   By: Kalman Jewels M.D.   On: 10/04/2013 01:55    Assessment/Plan Principal Problem:   Community acquired pneumonia Active Problems:   HYPERTENSION   CIRRHOSIS   CKD (chronic kidney disease), stage III   COPD (chronic obstructive pulmonary disease)   Kidney stone   HIV disease   1. Community-acquired pneumonia: As evidenced by CT abdomen, patient has right basilar infiltrate, indicating pneumonia. However this diagnosis does not explain her left-sided chest pain. It is possible that she could have left side infiltration too, which was missed by abdominal CT scan. Another important the differential diagnoses is pulmonary embolism, she cannot be completely ruled out. Patient does not have any signs of DVT and no tachycardia, making the pulmonary embolism less likely the diagnosis.  - Admit to med surge bed - ABx: ceftriaxone and azithromycin - get CXR-2 view to evaluate the whole lung filed.  - blood culture x 2 - NS @ 75cc/hr - Fever control - Repeat CBC in am - Sputum cultures - Blood cultures X 2 - urine Legionella and strep antigen  2.  kidney stone: CT scan reveals bilateral renal stones without obstruction. Patient sent back pain is likely caused by these kidney stones.  - IVF as above - pain control - May consult urology - check UA  3. hypertension: Blood pressure is controlled -Continue home medications  4. COPD: Continue  home inhaler medications  5. CKD-III: Baseline creatinine is about 1.2-1.7. His creatinine is 1.25 on admission, which is close to his baseline. 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 There is no obvious hepatic mass on CT abdomen. Currently seems to be compensated. - monitor closely.  7. HIV: Patient is on tenovovir and entecariv at home. She is compliant to her medications. -continue home medications -Check CD4 and viral load   DVT ppx: SQ Heparin   Code Status: Full code Family Communication: None at bed side.  Disposition Plan: Admit to inpatient  Ivor Costa [facility] Pager [phone]  If 7PM-7AM, please contact night-coverage [email or web address] Password [facility] 10/04/2013, 6:40 AM

## 2013-10-04 NOTE — ED Notes (Signed)
The patient is unable to give an urine specimen at this time. The tech has reported to the RN in charge. 

## 2013-10-04 NOTE — Progress Notes (Signed)
Attempted call for report. No answer. Will call back

## 2013-10-04 NOTE — Progress Notes (Signed)
Report called and given to [facility] in 3S stepdown.

## 2013-10-04 NOTE — ED Notes (Signed)
Pt reports R flank pain, worsening tonight. Pt denies urinary symptoms. Last BM yesterday. Pt reports pain 10/10, fentanyl given in triage without relief. Dr.Oni aware of patient's condition.

## 2013-10-04 NOTE — Progress Notes (Signed)
Pt arrived from 5W-VSS on NRB- sats 96%. Notified RT of bipap order.  Called CMT and elink-oriented patient to room, call bell within reach, will continue to monitor.

## 2013-10-05 DIAGNOSIS — F17200 Nicotine dependence, unspecified, uncomplicated: Secondary | ICD-10-CM

## 2013-10-05 DIAGNOSIS — J449 Chronic obstructive pulmonary disease, unspecified: Secondary | ICD-10-CM

## 2013-10-05 DIAGNOSIS — I10 Essential (primary) hypertension: Secondary | ICD-10-CM

## 2013-10-05 LAB — BASIC METABOLIC PANEL
Anion gap: 14 (ref 5–15)
BUN: 16 mg/dL (ref 6–23)
CALCIUM: 9.6 mg/dL (ref 8.4–10.5)
CO2: 22 meq/L (ref 19–32)
CREATININE: 1.07 mg/dL (ref 0.50–1.10)
Chloride: 99 mEq/L (ref 96–112)
GFR calc Af Amer: 66 mL/min — ABNORMAL LOW (ref >90)
GFR calc non Af Amer: 57 mL/min — ABNORMAL LOW (ref >90)
GLUCOSE: 175 mg/dL — AB (ref 70–99)
Potassium: 4.8 mEq/L (ref 3.7–5.3)
Sodium: 135 mEq/L — ABNORMAL LOW (ref 137–147)

## 2013-10-05 LAB — URINALYSIS, ROUTINE W REFLEX MICROSCOPIC
Bilirubin Urine: NEGATIVE
GLUCOSE, UA: NEGATIVE mg/dL
HGB URINE DIPSTICK: NEGATIVE
KETONES UR: NEGATIVE mg/dL
Nitrite: NEGATIVE
PROTEIN: NEGATIVE mg/dL
Specific Gravity, Urine: 1.022 (ref 1.005–1.030)
Urobilinogen, UA: 1 mg/dL (ref 0.0–1.0)
pH: 6 (ref 5.0–8.0)

## 2013-10-05 LAB — RAPID URINE DRUG SCREEN, HOSP PERFORMED
Amphetamines: NOT DETECTED
BARBITURATES: NOT DETECTED
BENZODIAZEPINES: POSITIVE — AB
Cocaine: NOT DETECTED
Opiates: POSITIVE — AB
Tetrahydrocannabinol: NOT DETECTED

## 2013-10-05 LAB — CBC
HCT: 39.2 % (ref 36.0–46.0)
HEMOGLOBIN: 13.4 g/dL (ref 12.0–15.0)
MCH: 31.3 pg (ref 26.0–34.0)
MCHC: 34.2 g/dL (ref 30.0–36.0)
MCV: 91.6 fL (ref 78.0–100.0)
Platelets: 105 10*3/uL — ABNORMAL LOW (ref 150–400)
RBC: 4.28 MIL/uL (ref 3.87–5.11)
RDW: 12.8 % (ref 11.5–15.5)
WBC: 6.9 10*3/uL (ref 4.0–10.5)

## 2013-10-05 LAB — URINE MICROSCOPIC-ADD ON

## 2013-10-05 LAB — STREP PNEUMONIAE URINARY ANTIGEN: STREP PNEUMO URINARY ANTIGEN: POSITIVE — AB

## 2013-10-05 LAB — HIV-1 RNA QUANT-NO REFLEX-BLD: HIV 1 RNA Quant: <20 copies/mL (ref <20)

## 2013-10-05 MED ORDER — POLYETHYLENE GLYCOL 3350 17 G PO PACK
17.0000 g | PACK | Freq: Every day | ORAL | Status: DC | PRN
  Administered 2013-10-07 – 2013-10-09 (×2): 17 g via ORAL
  Filled 2013-10-05 (×3): qty 1

## 2013-10-05 MED ORDER — SENNOSIDES-DOCUSATE SODIUM 8.6-50 MG PO TABS
1.0000 | ORAL_TABLET | Freq: Two times a day (BID) | ORAL | Status: DC
  Administered 2013-10-05 – 2013-10-11 (×13): 1 via ORAL
  Filled 2013-10-05 (×13): qty 1

## 2013-10-05 MED ORDER — METHYLPREDNISOLONE SODIUM SUCC 125 MG IJ SOLR
60.0000 mg | Freq: Four times a day (QID) | INTRAMUSCULAR | Status: DC
  Administered 2013-10-05 – 2013-10-08 (×11): 60 mg via INTRAVENOUS
  Filled 2013-10-05 (×6): qty 0.96
  Filled 2013-10-05: qty 2
  Filled 2013-10-05 (×6): qty 0.96

## 2013-10-05 NOTE — Progress Notes (Signed)
Utilization review completed.  

## 2013-10-05 NOTE — Progress Notes (Signed)
PROGRESS NOTE  [name] HWE:[id number] DOB: 1955/07/07 DOA: 10/03/2013 PCP: No PCP Per Patient  Assessment/Plan: Community-acquired pneumonia:  -CT abdomen, patient has right basilar infiltrate, indicating pneumonia.  - ABx: ceftriaxone and azithromycin   - blood culture x 2  - Fever control  - Repeat CBC in am  - Sputum cultures  - Blood cultures X 2  - urine Legionella and strep antigen  -increase steroids  -unable to wean past NRB- will continue to monitor in SDU  hypertension: Blood pressure is controlled  -Continue home medications   COPD: Continue home inhaler medications   CKD-III: Baseline creatinine is about 1.2-1.7. His creatinine is 1.25 on admission, which is close to his baseline.  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 There is no obvious hepatic mass on CT abdomen. Currently seems to be compensated.  - monitor closely.    Code Status: full Family Communication: patient Disposition Plan:    Consultants:    Procedures:      HPI/Subjective: C/o back pain  Objective: Filed Vitals:   10/05/13 1208  BP: 130/85  Pulse: 70  Temp: 97.8 F (36.6 C)  Resp: 22    Intake/Output Summary (Last 24 hours) at 10/05/13 1326 Last data filed at 10/05/13 1120  Gross per 24 hour  Intake    530 ml  Output   1500 ml  Net   -970 ml   Filed Weights   10/04/13 0403 10/05/13 0326  Weight: 92.7 kg (204 lb 5.9 oz) 92.1 kg (203 lb 0.7 oz)    Exam:   General:  On NRB  Cardiovascular: rrr  Respiratory: coarse with wheezing  Abdomen: +Bs, soft  Musculoskeletal: no edema   Data Reviewed: Basic Metabolic Panel:  Recent Labs Lab 10/03/13 2249 10/04/13 0737 10/05/13 0215  NA 140 140 135*  K 3.8 4.0 4.8  CL 100 104 99  CO2 23 23 22   GLUCOSE 91 90 175*  BUN 15 14 16   CREATININE 1.25* 1.16* 1.07  CALCIUM 9.6 9.1 9.6   Liver Function Tests:  Recent Labs Lab 10/03/13 2249  AST 18    ALT 18  ALKPHOS 94  BILITOT 0.5  PROT 7.6  ALBUMIN 3.8    Recent Labs Lab 10/03/13 2249  LIPASE 33   No results found for this basename: AMMONIA,  in the last 168 hours CBC:  Recent Labs Lab 10/03/13 2249 10/04/13 0737 10/05/13 0215  WBC 5.9 4.5 6.9  NEUTROABS 4.3 2.9  --   HGB 13.5 12.4 13.4  HCT 39.9 37.2 39.2  MCV 89.7 90.7 91.6  PLT 138* 121* 105*   Cardiac Enzymes:  Recent Labs Lab 10/04/13 0737  TROPONINI <0.30   BNP (last 3 results)  Recent Labs  12/21/12 1630 07/14/13 2120  PROBNP 149.3* 40.7   CBG: No results found for this basename: GLUCAP,  in the last 168 hours  Recent Results (from the past 240 hour(s))  MRSA PCR SCREENING     Status: None   Collection Time    10/04/13  3:55 AM      Result Value Ref Range Status   MRSA by PCR NEGATIVE  NEGATIVE Final   Comment:            The GeneXpert MRSA Assay (FDA     approved for NASAL specimens     only), is one component of a     comprehensive MRSA colonization     surveillance  program. It is not     intended to diagnose MRSA     infection nor to guide or     monitor treatment for     MRSA infections.     Studies: Dg Chest 2 View  10/04/2013   CLINICAL DATA:  Right flank pain.  EXAM: CHEST  2 VIEW  COMPARISON:  Chest radiograph performed 07/14/2013, and CT of the abdomen and pelvis performed earlier today at 1:40 a.m.  FINDINGS: The lungs are hypoexpanded. Minimal bibasilar opacities are better characterized on recent CT of the abdomen and pelvis, and raise concern for mild pneumonia. There is no evidence of pleural effusion or pneumothorax.  The heart is normal in size; the mediastinal contour is within normal limits. No acute osseous abnormalities are seen.  IMPRESSION: Lungs hypoexpanded. Minimal bibasilar opacities, better characterized on recent CT of the abdomen and pelvis, raise concern for mild pneumonia.   Electronically Signed   By: Garald Balding M.D.   On: 10/04/2013 03:53   Dg Chest  Port 1 View  10/04/2013   CLINICAL DATA:  Hypoxia.  EXAM: PORTABLE CHEST - 1 VIEW  COMPARISON:  Chest x-ray 07/14/2013.  FINDINGS: Lung volumes are low. Bibasilar subsegmental atelectasis. No consolidative airspace disease. No pleural effusions. No pneumothorax. No pulmonary nodule or mass noted. Pulmonary vasculature and the cardiomediastinal silhouette are within normal limits.  IMPRESSION: 1. Low lung volumes with probable bibasilar subsegmental atelectasis.   Electronically Signed   By: Vinnie Langton M.D.   On: 10/04/2013 15:39   Ct Renal Stone Study  10/04/2013   CLINICAL DATA:  Right flank pain.  EXAM: CT ABDOMEN AND PELVIS WITHOUT CONTRAST  TECHNIQUE: Multidetector CT imaging of the abdomen and pelvis was performed following the standard protocol without IV contrast.  COMPARISON:  09/16/2003.  FINDINGS: The lung bases demonstrate small effusions and a right basilar infiltrate. The heart is normal in size. No pericardial effusion  Advanced cirrhotic changes involving the liver without obvious hepatic mass. Gallbladder is normal. No common bile duct dilatation. The pancreas is grossly normal. The spleen is enlarged. No ascites. The adrenal glands are normal. There are bilateral renal calculi but no obstructing ureteral calculi or bladder calculi.  The stomach, duodenum, small bowel and colon are grossly normal without oral contrast. No inflammatory changes, mass lesions or obstructive findings. No mesenteric or retroperitoneal mass or adenopathy. The appendix is normal.  No pelvic mass or adenopathy. The uterus is surgically absent. The bladder is normal. No inguinal mass or adenopathy.  The bony structures are unremarkable.  IMPRESSION: Right basilar infiltrate.  Advanced cirrhotic changes involving the liver without obvious hepatic mass. Mild associated splenomegaly.  Bilateral renal calculi but no obstructing ureteral calculi.   Electronically Signed   By: Kalman Jewels M.D.   On: 10/04/2013 01:55      Scheduled Meds: . amitriptyline  100 mg Oral QHS  . azithromycin  500 mg Oral Q24H  . budesonide-formoterol  2 puff Inhalation BID  . cefTRIAXone (ROCEPHIN)  IV  1 g Intravenous Q24H  . entecavir  0.5 mg Oral QODAY  . heparin  5,000 Units Subcutaneous 3 times per day  . Iloperidone  1 tablet Oral BID  . lidocaine  1 patch Transdermal Daily  . methylPREDNISolone (SOLU-MEDROL) injection  60 mg Intravenous 4 times per day  . nicotine  7 mg Transdermal Daily  . pantoprazole  40 mg Oral Daily  . senna-docusate  1 tablet Oral BID  . spironolactone  25  mg Oral BID  . tenofovir  300 mg Oral QODAY  . tiotropium  18 mcg Inhalation Daily  . traMADol  50 mg Oral BID   Continuous Infusions:  Antibiotics Given (last 72 hours)   Date/Time Action Medication Dose Rate   10/04/13 0225 Given   azithromycin (ZITHROMAX) tablet 500 mg 500 mg    10/04/13 2111 Given   azithromycin (ZITHROMAX) tablet 500 mg 500 mg    10/05/13 0018 Given   cefTRIAXone (ROCEPHIN) 1 g in dextrose 5 % 50 mL IVPB 1 g 100 mL/hr   10/05/13 0941 Given   tenofovir (VIREAD) tablet 300 mg 300 mg       Principal Problem:   Community acquired pneumonia Active Problems:   HYPERTENSION   CIRRHOSIS   CKD (chronic kidney disease), stage III   COPD (chronic obstructive pulmonary disease)   Kidney stone   Tobacco abuse    Time spent: 35 min    VANN, JESSICA  [facility] Pager [phone] If 7PM-7AM, please contact night-coverage at [email or web address], password [facility] 10/05/2013, 1:26 PM  LOS: 2 days

## 2013-10-05 NOTE — Progress Notes (Signed)
Came in an pt was on NRB due to increased anxiety. Pt is sa02 93%, and says that she "needed a break". Explained that we would have to put her back on if she couldn't keep her oxygenation up.

## 2013-10-05 NOTE — Progress Notes (Signed)
Pt sa02 97% on NRB, and lung sounds seemed improved from yesterday.

## 2013-10-06 DIAGNOSIS — K7460 Unspecified cirrhosis of liver: Secondary | ICD-10-CM

## 2013-10-06 LAB — LEGIONELLA ANTIGEN, URINE: LEGIONELLA ANTIGEN, URINE: NEGATIVE

## 2013-10-06 LAB — T-HELPER CELLS (CD4) COUNT (NOT AT ARMC)
CD4 % Helper T Cell: 50 % (ref 33–55)
CD4 T Cell Abs: 590 /uL (ref 400–2700)

## 2013-10-06 LAB — GLUCOSE, CAPILLARY
GLUCOSE-CAPILLARY: 154 mg/dL — AB (ref 70–99)
GLUCOSE-CAPILLARY: 130 mg/dL — AB (ref 70–99)
Glucose-Capillary: 116 mg/dL — ABNORMAL HIGH (ref 70–99)
Glucose-Capillary: 158 mg/dL — ABNORMAL HIGH (ref 70–99)

## 2013-10-06 MED ORDER — WHITE PETROLATUM GEL
Status: AC
  Administered 2013-10-06: 0.2
  Filled 2013-10-06: qty 5

## 2013-10-06 MED ORDER — INSULIN ASPART 100 UNIT/ML ~~LOC~~ SOLN
0.0000 [IU] | Freq: Three times a day (TID) | SUBCUTANEOUS | Status: DC
  Administered 2013-10-06 – 2013-10-08 (×4): 3 [IU] via SUBCUTANEOUS
  Administered 2013-10-08 – 2013-10-12 (×7): 2 [IU] via SUBCUTANEOUS
  Administered 2013-10-13: 3 [IU] via SUBCUTANEOUS

## 2013-10-06 MED ORDER — INSULIN ASPART 100 UNIT/ML ~~LOC~~ SOLN: 0.0000 [IU] | Freq: Every day | SUBCUTANEOUS | Status: DC

## 2013-10-06 NOTE — Progress Notes (Addendum)
PROGRESS NOTE  Violanda RONESHIA DREW SWN:[id number] DOB: Sep 22, 1955 DOA: 10/03/2013 PCP: No PCP Per Patient  Assessment/Plan: Community-acquired pneumonia: STREP PNA -CT abdomen, patient has right basilar infiltrate, indicating pneumonia.  - ABx: ceftriaxone and azithromycin   - blood culture x 2  - Fever control  - Repeat CBC in am  - Sputum cultures  -strep antigen positive  -increase steroids  -weaning off NRB- will continue to monitor in SDU  hypertension: Blood pressure is controlled  -Continue home medications   COPD: Continue home inhaler medications   CKD-III: Baseline creatinine is about 1.2-1.7. His creatinine is 1.25 on admission, which is close to his baseline.  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 There is no obvious hepatic mass on CT abdomen. Currently seems to be compensated.  - monitor closely.   Patient does NOT have HIV as documented in the H&P.  Taking tenovir for Hep B  Code Status: full Family Communication: patient Disposition Plan:    Consultants:    Procedures:      HPI/Subjective: Breathing better  Objective: Filed Vitals:   10/06/13 0314  BP: 98/78  Pulse: 85  Temp: 97.4 F (36.3 C)  Resp: 29    Intake/Output Summary (Last 24 hours) at 10/06/13 0743 Last data filed at 10/06/13 0651  Gross per 24 hour  Intake    770 ml  Output   2950 ml  Net  -2180 ml   Filed Weights   10/04/13 0403 10/05/13 0326  Weight: 92.7 kg (204 lb 5.9 oz) 92.1 kg (203 lb 0.7 oz)    Exam:   General:  On NRB  Cardiovascular: rrr  Respiratory: moving more air but still wheezing  Abdomen: +Bs, soft  Musculoskeletal: no edema   Data Reviewed: Basic Metabolic Panel:  Recent Labs Lab 10/03/13 2249 10/04/13 0737 10/05/13 0215  NA 140 140 135*  K 3.8 4.0 4.8  CL 100 104 99  CO2 23 23 22   GLUCOSE 91 90 175*  BUN 15 14 16   CREATININE 1.25* 1.16* 1.07  CALCIUM 9.6 9.1 9.6   Liver  Function Tests:  Recent Labs Lab 10/03/13 2249  AST 18  ALT 18  ALKPHOS 94  BILITOT 0.5  PROT 7.6  ALBUMIN 3.8    Recent Labs Lab 10/03/13 2249  LIPASE 33   No results found for this basename: AMMONIA,  in the last 168 hours CBC:  Recent Labs Lab 10/03/13 2249 10/04/13 0737 10/05/13 0215  WBC 5.9 4.5 6.9  NEUTROABS 4.3 2.9  --   HGB 13.5 12.4 13.4  HCT 39.9 37.2 39.2  MCV 89.7 90.7 91.6  PLT 138* 121* 105*   Cardiac Enzymes:  Recent Labs Lab 10/04/13 0737  TROPONINI <0.30   BNP (last 3 results)  Recent Labs  12/21/12 1630 07/14/13 2120  PROBNP 149.3* 40.7   CBG: No results found for this basename: GLUCAP,  in the last 168 hours  Recent Results (from the past 240 hour(s))  MRSA PCR SCREENING     Status: None   Collection Time    10/04/13  3:55 AM      Result Value Ref Range Status   MRSA by PCR NEGATIVE  NEGATIVE Final   Comment:            The GeneXpert MRSA Assay (FDA     approved for NASAL specimens     only), is one component of a     comprehensive MRSA  colonization     surveillance program. It is not     intended to diagnose MRSA     infection nor to guide or     monitor treatment for     MRSA infections.  CULTURE, BLOOD (ROUTINE X 2)     Status: None   Collection Time    10/04/13  7:37 AM      Result Value Ref Range Status   Specimen Description BLOOD RIGHT ARM   Final   Special Requests BOTTLES DRAWN AEROBIC AND ANAEROBIC 10CC   Final   Culture  Setup Time     Final   Value: 10/04/2013 15:17     Performed at [facility]   Culture     Final   Value:        BLOOD CULTURE RECEIVED NO GROWTH TO DATE CULTURE WILL BE HELD FOR 5 DAYS BEFORE ISSUING A FINAL NEGATIVE REPORT     Note: Culture results may be compromised due to an excessive volume of blood received in culture bottles.     Performed at [facility]   Report Status PENDING   Incomplete  CULTURE, BLOOD (ROUTINE X 2)     Status: None   Collection Time     10/04/13  7:46 AM      Result Value Ref Range Status   Specimen Description BLOOD RIGHT HAND   Final   Special Requests BOTTLES DRAWN AEROBIC AND ANAEROBIC 10CC   Final   Culture  Setup Time     Final   Value: 10/04/2013 15:18     Performed at [facility]   Culture     Final   Value:        BLOOD CULTURE RECEIVED NO GROWTH TO DATE CULTURE WILL BE HELD FOR 5 DAYS BEFORE ISSUING A FINAL NEGATIVE REPORT     Note: Culture results may be compromised due to an excessive volume of blood received in culture bottles.     Performed at [facility]   Report Status PENDING   Incomplete     Studies: Dg Chest Port 1 View  10/04/2013   CLINICAL DATA:  Hypoxia.  EXAM: PORTABLE CHEST - 1 VIEW  COMPARISON:  Chest x-ray 07/14/2013.  FINDINGS: Lung volumes are low. Bibasilar subsegmental atelectasis. No consolidative airspace disease. No pleural effusions. No pneumothorax. No pulmonary nodule or mass noted. Pulmonary vasculature and the cardiomediastinal silhouette are within normal limits.  IMPRESSION: 1. Low lung volumes with probable bibasilar subsegmental atelectasis.   Electronically Signed   By: Vinnie Langton M.D.   On: 10/04/2013 15:39    Scheduled Meds: . amitriptyline  100 mg Oral QHS  . azithromycin  500 mg Oral Q24H  . budesonide-formoterol  2 puff Inhalation BID  . cefTRIAXone (ROCEPHIN)  IV  1 g Intravenous Q24H  . entecavir  0.5 mg Oral QODAY  . heparin  5,000 Units Subcutaneous 3 times per day  . Iloperidone  1 tablet Oral BID  . lidocaine  1 patch Transdermal Daily  . methylPREDNISolone (SOLU-MEDROL) injection  60 mg Intravenous 4 times per day  . nicotine  7 mg Transdermal Daily  . pantoprazole  40 mg Oral Daily  . senna-docusate  1 tablet Oral BID  . spironolactone  25 mg Oral BID  . tenofovir  300 mg Oral QODAY  . tiotropium  18 mcg Inhalation Daily  . traMADol  50 mg Oral BID   Continuous Infusions:  Antibiotics Given (last 72 hours)  Date/Time Action  Medication Dose Rate   10/04/13 0225 Given   azithromycin (ZITHROMAX) tablet 500 mg 500 mg    10/04/13 2111 Given   azithromycin (ZITHROMAX) tablet 500 mg 500 mg    10/05/13 0018 Given   cefTRIAXone (ROCEPHIN) 1 g in dextrose 5 % 50 mL IVPB 1 g 100 mL/hr   10/05/13 0941 Given   tenofovir (VIREAD) tablet 300 mg 300 mg    10/05/13 2131 Given   azithromycin (ZITHROMAX) tablet 500 mg 500 mg    10/05/13 2339 Given   cefTRIAXone (ROCEPHIN) 1 g in dextrose 5 % 50 mL IVPB 1 g 100 mL/hr      Principal Problem:   Community acquired pneumonia Active Problems:   HYPERTENSION   CIRRHOSIS   CKD (chronic kidney disease), stage III   COPD (chronic obstructive pulmonary disease)   Kidney stone   Tobacco abuse    Time spent: 35 min    Karalee Hauter  [facility] Pager [phone] If 7PM-7AM, please contact night-coverage at [email or web address], password [facility] 10/06/2013, 7:43 AM  LOS: 3 days

## 2013-10-06 NOTE — Care Management Note (Addendum)
  Page 1 of 1   10/13/2013     2:55:53 PM CARE MANAGEMENT NOTE 10/13/2013  Patient:  [name], [name]   Account Number:  [id number]  Date Initiated:  10/06/2013  Documentation initiated by:  GRAVES-BIGELOW,BRENDA  Subjective/Objective Assessment:   Pt admitted for CAP. Initiated on IV abx therapy.     Action/Plan:   CM will continue to monitor for disposition needs.   Anticipated DC Date:  10/08/2013   Anticipated DC Plan:  [place]  CM consult      Choice offered to / List presented to:             Status of service:  In process, will continue to follow Medicare Important Message given?  YES (If response is "NO", the following Medicare IM given date fields will be blank) Date Medicare IM given:  10/06/2013 Medicare IM given by:  GRAVES-BIGELOW,BRENDA Date Additional Medicare IM given:  10/13/2013 Additional Medicare IM given by:  Lorne Skeens  Discharge Disposition:    Per UR Regulation:  Reviewed for med. necessity/level of care/duration of stay  If discussed at [place] of Stay Meetings, dates discussed:    Comments:  10/13/13 Leslie, MSN, CM- Additional Medicare IM letter provided.

## 2013-10-06 NOTE — Clinical Documentation Improvement (Signed)
  58 year old white female admitted with CAP and nonobstructive kidney stones.  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  Patient was taking Tenofovir and Entecariv prior to admission.  Known history of cirrhosis and Hepatitis B.  Lab Review per [facility] Component     Latest Ref Rng 11/22/2010 12/22/2012 10/04/2013  HIV 1 RNA Quant     <20 copies/mL   <20  HIV1 RNA Quant, Log     <1.30 log 10   <1.30  HIV     NON REACTIVE NON REACTIVE NON REACTIVE    Please clarify in the progress notes and discharge summary if the diagnosis of "HIV Disease" is applicable to this admission.  If HIV Disease is not applicable to this patient, please clarify the condition that the patient is receiving Tenofovir and Entecariv for.  Thank You, Erling Conte ,RN Clinical Documentation Specialist:  [phone] [place] Information Management

## 2013-10-07 LAB — GLUCOSE, CAPILLARY
Glucose-Capillary: 107 mg/dL — ABNORMAL HIGH (ref 70–99)
Glucose-Capillary: 162 mg/dL — ABNORMAL HIGH (ref 70–99)
Glucose-Capillary: 102 mg/dL — ABNORMAL HIGH (ref 70–99)
Glucose-Capillary: 181 mg/dL — ABNORMAL HIGH (ref 70–99)

## 2013-10-07 MED ORDER — ENTECAVIR 0.5 MG PO TABS
0.5000 mg | ORAL_TABLET | ORAL | Status: DC
  Administered 2013-10-10 – 2013-10-14 (×3): 0.5 mg via ORAL
  Filled 2013-10-07: qty 1

## 2013-10-07 MED ORDER — ILOPERIDONE 6 MG PO TABS
2.0000 | ORAL_TABLET | Freq: Every day | ORAL | Status: DC
  Administered 2013-10-08 – 2013-10-09 (×2): 12 mg via ORAL
  Filled 2013-10-07 (×2): qty 2

## 2013-10-07 NOTE — Clinical Documentation Improvement (Signed)
  58 year old white female admitted 10/03/13 with Strep PNA.  Known history of COPD requiring home medications.  Patient has required increasing levels of supplemental 02, up to NRB, to maintain sats above 90% from 10/04/13 at 1352 through 10/07/13.  Attempts to wean 02 to nasal cannula have resulted in desaturations per progress note 10/07/13.  Respiratory Rate has been in the mid to high 20's since admission.    Possible Clinical Conditions:   - Acute Hypoxic Respiratory  Failure 2/2 ..........................   - Other Condition   - Unable to Clinically Determine  Thank You, Vilinda Flake RN BSN CCDS [place] Documentation Improvement

## 2013-10-07 NOTE — Progress Notes (Signed)
PROGRESS NOTE  [name] YNW:[id number] DOB: September 03, 1955 DOA: 10/03/2013 PCP: No PCP Per Patient  Assessment/Plan: Community-acquired pneumonia: STREP PNA -CT abdomen, patient has right basilar infiltrate, indicating pneumonia.  - ABx: ceftriaxone and azithromycin   - blood culture x 2 NGTD - Fever control  - Sputum cultures  -strep antigen positive  - steroids - seesm to be responding finally -weaning off NRB- transfer to floor if able to get to nasal cannula  hypertension: Blood pressure is controlled  -Continue home medications   COPD: Continue home inhaler medications   CKD-III: Baseline creatinine is about 1.2-1.7. His creatinine is 1.25 on admission, which is close to  baseline.  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 There is no obvious hepatic mass on CT abdomen. Currently seems to be compensated.  - monitor closely.   Patient does NOT have HIV as documented in the H&P.  Taking tenovir for Hep B  Code Status: full Family Communication: patient Disposition Plan:    Consultants:    Procedures:      HPI/Subjective: desats when moved to nasla cannula breathing better  Objective: Filed Vitals:   10/07/13 0917  BP: 110/67  Pulse: 3  Temp: 97.3 F (36.3 C)  Resp: 24    Intake/Output Summary (Last 24 hours) at 10/07/13 1238 Last data filed at 10/07/13 0900  Gross per 24 hour  Intake    290 ml  Output   2150 ml  Net  -1860 ml   Filed Weights   10/04/13 0403 10/05/13 0326  Weight: 92.7 kg (204 lb 5.9 oz) 92.1 kg (203 lb 0.7 oz)    Exam:   General:  On NRB  Cardiovascular: rrr  Respiratory: moving more air but still wheezing  Abdomen: +Bs, soft  Musculoskeletal: no edema   Data Reviewed: Basic Metabolic Panel:  Recent Labs Lab 10/03/13 2249 10/04/13 0737 10/05/13 0215  NA 140 140 135*  K 3.8 4.0 4.8  CL 100 104 99  CO2 23 23 22   GLUCOSE 91 90 175*  BUN 15 14 16     CREATININE 1.25* 1.16* 1.07  CALCIUM 9.6 9.1 9.6   Liver Function Tests:  Recent Labs Lab 10/03/13 2249  AST 18  ALT 18  ALKPHOS 94  BILITOT 0.5  PROT 7.6  ALBUMIN 3.8    Recent Labs Lab 10/03/13 2249  LIPASE 33   No results found for this basename: AMMONIA,  in the last 168 hours CBC:  Recent Labs Lab 10/03/13 2249 10/04/13 0737 10/05/13 0215  WBC 5.9 4.5 6.9  NEUTROABS 4.3 2.9  --   HGB 13.5 12.4 13.4  HCT 39.9 37.2 39.2  MCV 89.7 90.7 91.6  PLT 138* 121* 105*   Cardiac Enzymes:  Recent Labs Lab 10/04/13 0737  TROPONINI <0.30   BNP (last 3 results)  Recent Labs  12/21/12 1630 07/14/13 2120  PROBNP 149.3* 40.7   CBG:  Recent Labs Lab 10/06/13 0749 10/06/13 1152 10/06/13 1555 10/06/13 2206 10/07/13 0824  GLUCAP 116* 158* 154* 130* 107*    Recent Results (from the past 240 hour(s))  MRSA PCR SCREENING     Status: None   Collection Time    10/04/13  3:55 AM      Result Value Ref Range Status   MRSA by PCR NEGATIVE  NEGATIVE Final   Comment:            The GeneXpert MRSA Assay (FDA  approved for NASAL specimens     only), is one component of a     comprehensive MRSA colonization     surveillance program. It is not     intended to diagnose MRSA     infection nor to guide or     monitor treatment for     MRSA infections.  CULTURE, BLOOD (ROUTINE X 2)     Status: None   Collection Time    10/04/13  7:37 AM      Result Value Ref Range Status   Specimen Description BLOOD RIGHT ARM   Final   Special Requests BOTTLES DRAWN AEROBIC AND ANAEROBIC 10CC   Final   Culture  Setup Time     Final   Value: 10/04/2013 15:17     Performed at [facility]   Culture     Final   Value:        BLOOD CULTURE RECEIVED NO GROWTH TO DATE CULTURE WILL BE HELD FOR 5 DAYS BEFORE ISSUING A FINAL NEGATIVE REPORT     Note: Culture results may be compromised due to an excessive volume of blood received in culture bottles.     Performed at [facility]   Report Status PENDING   Incomplete  CULTURE, BLOOD (ROUTINE X 2)     Status: None   Collection Time    10/04/13  7:46 AM      Result Value Ref Range Status   Specimen Description BLOOD RIGHT HAND   Final   Special Requests BOTTLES DRAWN AEROBIC AND ANAEROBIC 10CC   Final   Culture  Setup Time     Final   Value: 10/04/2013 15:18     Performed at [facility]   Culture     Final   Value:        BLOOD CULTURE RECEIVED NO GROWTH TO DATE CULTURE WILL BE HELD FOR 5 DAYS BEFORE ISSUING A FINAL NEGATIVE REPORT     Note: Culture results may be compromised due to an excessive volume of blood received in culture bottles.     Performed at [facility]   Report Status PENDING   Incomplete     Studies: No results found.  Scheduled Meds: . amitriptyline  100 mg Oral QHS  . azithromycin  500 mg Oral Q24H  . budesonide-formoterol  2 puff Inhalation BID  . cefTRIAXone (ROCEPHIN)  IV  1 g Intravenous Q24H  . entecavir  0.5 mg Oral QODAY  . heparin  5,000 Units Subcutaneous 3 times per day  . Iloperidone  1 tablet Oral BID  . insulin aspart  0-15 Units Subcutaneous TID WC  . insulin aspart  0-5 Units Subcutaneous QHS  . lidocaine  1 patch Transdermal Daily  . methylPREDNISolone (SOLU-MEDROL) injection  60 mg Intravenous 4 times per day  . nicotine  7 mg Transdermal Daily  . pantoprazole  40 mg Oral Daily  . senna-docusate  1 tablet Oral BID  . spironolactone  25 mg Oral BID  . tenofovir  300 mg Oral QODAY  . tiotropium  18 mcg Inhalation Daily  . traMADol  50 mg Oral BID   Continuous Infusions:  Antibiotics Given (last 72 hours)   Date/Time Action Medication Dose Rate   10/04/13 2111 Given   azithromycin (ZITHROMAX) tablet 500 mg 500 mg    10/05/13 0018 Given   cefTRIAXone (ROCEPHIN) 1 g in dextrose 5 % 50 mL IVPB 1 g 100 mL/hr   10/05/13  9357 Given   tenofovir (VIREAD) tablet 300 mg 300 mg    10/05/13 2131 Given   azithromycin (ZITHROMAX) tablet 500 mg  500 mg    10/05/13 2339 Given   cefTRIAXone (ROCEPHIN) 1 g in dextrose 5 % 50 mL IVPB 1 g 100 mL/hr   10/06/13 2234 Given   azithromycin (ZITHROMAX) tablet 500 mg 500 mg    10/06/13 2347 Given   cefTRIAXone (ROCEPHIN) 1 g in dextrose 5 % 50 mL IVPB 1 g 100 mL/hr   10/07/13 1049 Given   tenofovir (VIREAD) tablet 300 mg 300 mg       Principal Problem:   Community acquired pneumonia Active Problems:   HYPERTENSION   CIRRHOSIS   CKD (chronic kidney disease), stage III   COPD (chronic obstructive pulmonary disease)   Kidney stone   Tobacco abuse    Time spent: 25 min    Eulogio Bear  [facility] Pager [phone] If 7PM-7AM, please contact night-coverage at [email or web address], password [facility] 10/07/2013, 12:38 PM  LOS: 4 days

## 2013-10-07 NOTE — Progress Notes (Signed)
Patient stable for transfer. Report endorsed to [place]. Patient will transfer to 4N bed 18

## 2013-10-08 ENCOUNTER — Inpatient Hospital Stay (HOSPITAL_COMMUNITY): Payer: Medicare Other

## 2013-10-08 DIAGNOSIS — F411 Generalized anxiety disorder: Secondary | ICD-10-CM

## 2013-10-08 DIAGNOSIS — I2699 Other pulmonary embolism without acute cor pulmonale: Secondary | ICD-10-CM

## 2013-10-08 DIAGNOSIS — R911 Solitary pulmonary nodule: Secondary | ICD-10-CM

## 2013-10-08 DIAGNOSIS — Z0000 Encounter for general adult medical examination without abnormal findings: Secondary | ICD-10-CM

## 2013-10-08 DIAGNOSIS — R7309 Other abnormal glucose: Secondary | ICD-10-CM

## 2013-10-08 DIAGNOSIS — I517 Cardiomegaly: Secondary | ICD-10-CM

## 2013-10-08 LAB — BASIC METABOLIC PANEL
ANION GAP: 13 (ref 5–15)
BUN: 30 mg/dL — ABNORMAL HIGH (ref 6–23)
CALCIUM: 9.4 mg/dL (ref 8.4–10.5)
CO2: 27 mEq/L (ref 19–32)
Chloride: 95 mEq/L — ABNORMAL LOW (ref 96–112)
Creatinine, Ser: 1.14 mg/dL — ABNORMAL HIGH (ref 0.50–1.10)
GFR calc non Af Amer: 53 mL/min — ABNORMAL LOW (ref >90)
GFR, EST AFRICAN AMERICAN: 61 mL/min — AB (ref >90)
Glucose, Bld: 165 mg/dL — ABNORMAL HIGH (ref 70–99)
Potassium: 4.6 mEq/L (ref 3.7–5.3)
Sodium: 135 mEq/L — ABNORMAL LOW (ref 137–147)

## 2013-10-08 LAB — ANTITHROMBIN III: ANTITHROMB III FUNC: 97 % (ref 75–120)

## 2013-10-08 LAB — PROTIME-INR
INR: 1.24 (ref 0.00–1.49)
Prothrombin Time: 15.6 seconds — ABNORMAL HIGH (ref 11.6–15.2)

## 2013-10-08 LAB — CBC
HCT: 38.3 % (ref 36.0–46.0)
Hemoglobin: 13.2 g/dL (ref 12.0–15.0)
MCH: 31.6 pg (ref 26.0–34.0)
MCHC: 34.5 g/dL (ref 30.0–36.0)
MCV: 91.6 fL (ref 78.0–100.0)
PLATELETS: 106 10*3/uL — AB (ref 150–400)
RBC: 4.18 MIL/uL (ref 3.87–5.11)
RDW: 12.5 % (ref 11.5–15.5)
WBC: 7.5 10*3/uL (ref 4.0–10.5)

## 2013-10-08 LAB — GLUCOSE, CAPILLARY
GLUCOSE-CAPILLARY: 147 mg/dL — AB (ref 70–99)
GLUCOSE-CAPILLARY: 148 mg/dL — AB (ref 70–99)
GLUCOSE-CAPILLARY: 165 mg/dL — AB (ref 70–99)
Glucose-Capillary: 153 mg/dL — ABNORMAL HIGH (ref 70–99)
Glucose-Capillary: 171 mg/dL — ABNORMAL HIGH (ref 70–99)

## 2013-10-08 LAB — APTT: APTT: 150 s — AB (ref 24–37)

## 2013-10-08 LAB — HEPARIN LEVEL (UNFRACTIONATED): Heparin Unfractionated: 1.32 IU/mL — ABNORMAL HIGH (ref 0.30–0.70)

## 2013-10-08 MED ORDER — HEPARIN (PORCINE) IN NACL 100-0.45 UNIT/ML-% IJ SOLN
1200.0000 [IU]/h | INTRAMUSCULAR | Status: DC
  Administered 2013-10-08 (×2): 1200 [IU]/h via INTRAVENOUS
  Filled 2013-10-08: qty 250

## 2013-10-08 MED ORDER — TIOTROPIUM BROMIDE MONOHYDRATE 18 MCG IN CAPS
18.0000 ug | ORAL_CAPSULE | Freq: Every day | RESPIRATORY_TRACT | Status: DC
  Administered 2013-10-09 – 2013-10-14 (×6): 18 ug via RESPIRATORY_TRACT
  Filled 2013-10-08 (×2): qty 5

## 2013-10-08 MED ORDER — PREDNISONE 20 MG PO TABS
40.0000 mg | ORAL_TABLET | Freq: Every day | ORAL | Status: DC
  Administered 2013-10-09 – 2013-10-13 (×5): 40 mg via ORAL
  Filled 2013-10-08 (×5): qty 2

## 2013-10-08 MED ORDER — HEPARIN BOLUS VIA INFUSION
5000.0000 [IU] | Freq: Once | INTRAVENOUS | Status: AC
  Administered 2013-10-08: 5000 [IU] via INTRAVENOUS
  Filled 2013-10-08: qty 5000

## 2013-10-08 MED ORDER — LEVOFLOXACIN 750 MG PO TABS
750.0000 mg | ORAL_TABLET | Freq: Every evening | ORAL | Status: DC
  Administered 2013-10-08 – 2013-10-12 (×5): 750 mg via ORAL
  Filled 2013-10-08 (×5): qty 1

## 2013-10-08 MED ORDER — IPRATROPIUM-ALBUTEROL 0.5-2.5 (3) MG/3ML IN SOLN
3.0000 mL | RESPIRATORY_TRACT | Status: DC
  Administered 2013-10-08: 3 mL via RESPIRATORY_TRACT
  Filled 2013-10-08: qty 3

## 2013-10-08 MED ORDER — FLEET ENEMA 7-19 GM/118ML RE ENEM
1.0000 | ENEMA | Freq: Once | RECTAL | Status: DC
  Filled 2013-10-08: qty 1

## 2013-10-08 MED ORDER — IOHEXOL 350 MG/ML SOLN
80.0000 mL | Freq: Once | INTRAVENOUS | Status: AC | PRN
  Administered 2013-10-08: 70 mL via INTRAVENOUS

## 2013-10-08 MED ORDER — HEPARIN (PORCINE) IN NACL 100-0.45 UNIT/ML-% IJ SOLN
600.0000 [IU]/h | INTRAMUSCULAR | Status: DC
  Administered 2013-10-08: 950 [IU]/h via INTRAVENOUS
  Administered 2013-10-09: 600 [IU]/h via INTRAVENOUS
  Administered 2013-10-09 (×2): 750 [IU]/h via INTRAVENOUS
  Filled 2013-10-08: qty 250

## 2013-10-08 MED ORDER — METHYLPREDNISOLONE SODIUM SUCC 40 MG IJ SOLR
40.0000 mg | Freq: Four times a day (QID) | INTRAMUSCULAR | Status: DC
  Administered 2013-10-08 (×2): 40 mg via INTRAVENOUS
  Filled 2013-10-08 (×2): qty 1

## 2013-10-08 MED ORDER — SPIRONOLACTONE 25 MG PO TABS
25.0000 mg | ORAL_TABLET | Freq: Every day | ORAL | Status: DC
  Administered 2013-10-09: 25 mg via ORAL
  Filled 2013-10-08 (×2): qty 1

## 2013-10-08 MED ORDER — IPRATROPIUM-ALBUTEROL 0.5-2.5 (3) MG/3ML IN SOLN
3.0000 mL | Freq: Three times a day (TID) | RESPIRATORY_TRACT | Status: DC
  Administered 2013-10-08: 3 mL via RESPIRATORY_TRACT
  Filled 2013-10-08: qty 3

## 2013-10-08 NOTE — Progress Notes (Signed)
PROGRESS NOTE  [name]:[id number] DOB: 05/06/1955 DOA: 10/03/2013 PCP: No PCP Per Patient  Assessment/Plan: Acute hypoxic Respiratory Failure:  Not on home oxygen.  Treating for PNA, COPD exacerbation.  Will check CT angio rule out PE, prior history of PE.  Will schedule nebulizer treatments.  She was place on NB overnight after she desat on exertion.   Community-acquired pneumonia: STREP PNA -CT abdomen, patient has right basilar infiltrate, indicating pneumonia.  - ABx: ceftriaxone and azithromycin   - blood culture x 2 NGTD - Fever control  - Sputum cultures  -strep antigen positive   hypertension: Blood pressure is controlled  -Continue home medications   COPD: Continue home inhaler medications. Will add schedule nebulizer treatments.   CKD-III: Baseline creatinine is about 1.2-1.7. His creatinine is 1.25 on admission, which is close to  baseline.  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 There is no obvious hepatic mass on CT abdomen. Currently seems to be compensated.  - monitor closely.   Patient does NOT have HIV as documented in the H&P.  Taking tenovir for Hep B  Code Status: full Family Communication: patient Disposition Plan:    Consultants:  none  Procedures:  none    HPI/Subjective: She feels better today than yesterday. Breathing better.  She doesn't use oxygen at home.  No BM for last few days, passing gas.    Objective: Filed Vitals:   10/08/13 0455  BP: 116/74  Pulse: 64  Temp: 97.6 F (36.4 C)  Resp: 20    Intake/Output Summary (Last 24 hours) at 10/08/13 0948 Last data filed at 10/08/13 0700  Gross per 24 hour  Intake   1670 ml  Output   2425 ml  Net   -755 ml   Filed Weights   10/04/13 0403 10/05/13 0326 10/08/13 0012  Weight: 92.7 kg (204 lb 5.9 oz) 92.1 kg (203 lb 0.7 oz) 92.7 kg (204 lb 5.9 oz)    Exam:   General:  On NRB, in no distress, not using  accessory muscle to breath.   Cardiovascular: rrr  Respiratory: bilateral air movement. Wheezing on left side.   Abdomen: +Bs, soft  Musculoskeletal: no edema   Data Reviewed: Basic Metabolic Panel:  Recent Labs Lab 10/03/13 2249 10/04/13 0737 10/05/13 0215  NA 140 140 135*  K 3.8 4.0 4.8  CL 100 104 99  CO2 23 23 22   GLUCOSE 91 90 175*  BUN 15 14 16   CREATININE 1.25* 1.16* 1.07  CALCIUM 9.6 9.1 9.6   Liver Function Tests:  Recent Labs Lab 10/03/13 2249  AST 18  ALT 18  ALKPHOS 94  BILITOT 0.5  PROT 7.6  ALBUMIN 3.8    Recent Labs Lab 10/03/13 2249  LIPASE 33   No results found for this basename: AMMONIA,  in the last 168 hours CBC:  Recent Labs Lab 10/03/13 2249 10/04/13 0737 10/05/13 0215  WBC 5.9 4.5 6.9  NEUTROABS 4.3 2.9  --   HGB 13.5 12.4 13.4  HCT 39.9 37.2 39.2  MCV 89.7 90.7 91.6  PLT 138* 121* 105*   Cardiac Enzymes:  Recent Labs Lab 10/04/13 0737  TROPONINI <0.30   BNP (last 3 results)  Recent Labs  12/21/12 1630 07/14/13 2120  PROBNP 149.3* 40.7   CBG:  Recent Labs Lab 10/07/13 1125 10/07/13 1554 10/07/13 2153 10/08/13 0651 10/08/13 0726  GLUCAP 162* 102* 181* 148* 147*    Recent Results (from  the past 240 hour(s))  MRSA PCR SCREENING     Status: None   Collection Time    10/04/13  3:55 AM      Result Value Ref Range Status   MRSA by PCR NEGATIVE  NEGATIVE Final   Comment:            The GeneXpert MRSA Assay (FDA     approved for NASAL specimens     only), is one component of a     comprehensive MRSA colonization     surveillance program. It is not     intended to diagnose MRSA     infection nor to guide or     monitor treatment for     MRSA infections.  CULTURE, BLOOD (ROUTINE X 2)     Status: None   Collection Time    10/04/13  7:37 AM      Result Value Ref Range Status   Specimen Description BLOOD RIGHT ARM   Final   Special Requests BOTTLES DRAWN AEROBIC AND ANAEROBIC 10CC   Final   Culture   Setup Time     Final   Value: 10/04/2013 15:17     Performed at [facility]   Culture     Final   Value:        BLOOD CULTURE RECEIVED NO GROWTH TO DATE CULTURE WILL BE HELD FOR 5 DAYS BEFORE ISSUING A FINAL NEGATIVE REPORT     Note: Culture results may be compromised due to an excessive volume of blood received in culture bottles.     Performed at [facility]   Report Status PENDING   Incomplete  CULTURE, BLOOD (ROUTINE X 2)     Status: None   Collection Time    10/04/13  7:46 AM      Result Value Ref Range Status   Specimen Description BLOOD RIGHT HAND   Final   Special Requests BOTTLES DRAWN AEROBIC AND ANAEROBIC 10CC   Final   Culture  Setup Time     Final   Value: 10/04/2013 15:18     Performed at [facility]   Culture     Final   Value:        BLOOD CULTURE RECEIVED NO GROWTH TO DATE CULTURE WILL BE HELD FOR 5 DAYS BEFORE ISSUING A FINAL NEGATIVE REPORT     Note: Culture results may be compromised due to an excessive volume of blood received in culture bottles.     Performed at [facility]   Report Status PENDING   Incomplete     Studies: No results found.  Scheduled Meds: . amitriptyline  100 mg Oral QHS  . azithromycin  500 mg Oral Q24H  . budesonide-formoterol  2 puff Inhalation BID  . cefTRIAXone (ROCEPHIN)  IV  1 g Intravenous Q24H  . entecavir  0.5 mg Oral QODAY  . heparin  5,000 Units Subcutaneous 3 times per day  . Iloperidone  2 tablet Oral Daily  . insulin aspart  0-15 Units Subcutaneous TID WC  . insulin aspart  0-5 Units Subcutaneous QHS  . ipratropium-albuterol  3 mL Nebulization Q4H  . lidocaine  1 patch Transdermal Daily  . methylPREDNISolone (SOLU-MEDROL) injection  40 mg Intravenous 4 times per day  . nicotine  7 mg Transdermal Daily  . pantoprazole  40 mg Oral Daily  . senna-docusate  1 tablet Oral BID  . sodium phosphate  1 enema Rectal Once  . spironolactone  25  mg Oral BID  . tenofovir  300 mg Oral QODAY  .  traMADol  50 mg Oral BID   Continuous Infusions:  Antibiotics Given (last 72 hours)   Date/Time Action Medication Dose Rate   10/05/13 2131 Given   azithromycin (ZITHROMAX) tablet 500 mg 500 mg    10/05/13 2339 Given   cefTRIAXone (ROCEPHIN) 1 g in dextrose 5 % 50 mL IVPB 1 g 100 mL/hr   10/06/13 2234 Given   azithromycin (ZITHROMAX) tablet 500 mg 500 mg    10/06/13 2347 Given   cefTRIAXone (ROCEPHIN) 1 g in dextrose 5 % 50 mL IVPB 1 g 100 mL/hr   10/07/13 1049 Given   tenofovir (VIREAD) tablet 300 mg 300 mg    10/07/13 2109 Given   azithromycin (ZITHROMAX) tablet 500 mg 500 mg    10/08/13 0018 Given   cefTRIAXone (ROCEPHIN) 1 g in dextrose 5 % 50 mL IVPB 1 g 100 mL/hr      Principal Problem:   Community acquired pneumonia Active Problems:   HYPERTENSION   CIRRHOSIS   CKD (chronic kidney disease), stage III   COPD (chronic obstructive pulmonary disease)   Kidney stone   Tobacco abuse    Time spent: 25 min    Niel Hummer A  [facility] Pager [phone] If 7PM-7AM, please contact night-coverage at [email or web address], password [facility] 10/08/2013, 9:48 AM  LOS: 5 days

## 2013-10-08 NOTE — Consult Note (Signed)
Name: [name] MRN: [id number] DOB: Oct 18, 1955    ADMISSION DATE:  10/03/2013 CONSULTATION DATE:  10/08/13  REFERRING MD :  Tyrell Antonio  PRIMARY SERVICE:  [facility]   CHIEF COMPLAINT:  Respiratory failure   BRIEF PATIENT DESCRIPTION: 58yo female active smoker with hx HTN, CKD, COPD, PE (rx 1 year coumadin, off x 6 months), Hep B who presented 9/12 with back pain, chest pain.  Initially treated as CAP +/- AECOPD but had worsening hypoxia and CTA chest 9/16 revealed extensive PE.  PCCM consulted for assist.   SIGNIFICANT EVENTS / STUDIES:  CT renal stone study 9/12>>>Right basilar infiltrate. Advanced cirrhotic changes involving the liver without obvious hepatic mass. Mild associated splenomegaly. Bilateral renal calculi but no obstructing ureteral calculi.  CTA chest 9/16>>>extensive R sided PE, small peripheral L PE, no frank R heart strain, extensive consolidation and several nodular opacities, largest 77mm RUL    LINES / TUBES:   CULTURES: Urine strep 9/12>>>POS BCx2 9/12>>>  ANTIBIOTICS: Rocephin 9/12>>> Azithro 9/12>>>  HISTORY OF PRESENT ILLNESS:  58yo female active smoker with hx HTN, CKD, COPD, PE (rx 1 year coumadin, off x 6 months), Hep B who presented 9/12 with back pain, chest pain.  Initially treated as CAP +/- AECOPD but had worsening hypoxia and CTA chest 9/16 revealed extensive PE.  PCCM consulted for assist.  Pt currently feeling much better.  Denies chest pain, hemoptysis, purulent sputum, fevers, chills, abd pain, leg/calf pain.  NO recent travel.  Denies unintentional weight loss (has lost >50 lbs but has been actively dieting).    PAST MEDICAL HISTORY :  Past Medical History  Diagnosis Date  . Hypertension   . COPD (chronic obstructive pulmonary disease)   . Renal insufficiency   . Cirrhosis   . Pulmonary emboli   . Pneumonia    Past Surgical History  Procedure Laterality Date  . Abdominal hysterectomy     Prior to Admission medications   Medication  Sig Start Date End Date Taking? Authorizing Provider  albuterol (PROVENTIL HFA;VENTOLIN HFA) 108 (90 BASE) MCG/ACT inhaler Inhale 2 puffs into the lungs every 6 (six) hours as needed. For shortness of breath    Yes Historical Provider, MD  ALPRAZolam Duanne Moron) 1 MG tablet Take 1 mg by mouth 3 (three) times daily as needed. For anxiety   Yes Historical Provider, MD  amitriptyline (ELAVIL) 50 MG tablet Take 100 mg by mouth at bedtime.    Yes Historical Provider, MD  budesonide-formoterol (SYMBICORT) 160-4.5 MCG/ACT inhaler Inhale 2 puffs into the lungs 2 (two) times daily.     Yes Historical Provider, MD  entecavir (BARACLUDE) 0.5 MG tablet Take 0.5 mg by mouth every other day.    Yes Historical Provider, MD  Iloperidone (FANAPT) 6 MG TABS Take 2 tablets by mouth daily.    Yes Historical Provider, MD  meloxicam (MOBIC) 15 MG tablet Take 15 mg by mouth daily.  05/01/13  Yes Historical Provider, MD  omeprazole (PRILOSEC) 40 MG capsule Take 80 mg by mouth daily.    Yes Historical Provider, MD  spironolactone (ALDACTONE) 25 MG tablet Take 25 mg by mouth 2 (two) times daily.     Yes Historical Provider, MD  tenofovir (VIREAD) 300 MG tablet Take 300 mg by mouth every other day.   Yes Historical Provider, MD  tiotropium (SPIRIVA) 18 MCG inhalation capsule Place 18 mcg into inhaler and inhale daily.     Yes Historical Provider, MD  traMADol (ULTRAM) 50 MG tablet Take 50 mg  by mouth 2 (two) times daily.  06/18/13  Yes Historical Provider, MD   Allergies  Allergen Reactions  . Duloxetine Other (See Comments)    REACTION: Headache  . Gabapentin     REACTION: rash in mouth  . Neomycin-Bacitracin Zn-Polymyx     REACTION: rash/hives    FAMILY HISTORY:  Family History  Problem Relation Age of Onset  . Stroke Mother   . Lung cancer Father    SOCIAL HISTORY:  reports that she has quit smoking. Her smoking use included Cigarettes. She smoked 1.00 pack per day. She does not have any smokeless tobacco history on  file. She reports that she does not drink alcohol or use illicit drugs.  REVIEW OF SYSTEMS:   As per HPI - All other systems reviewed and were neg.     SUBJECTIVE:   VITAL SIGNS: Temp:  [97.4 F (36.3 C)-97.6 F (36.4 C)] 97.4 F (36.3 C) (09/16 1036) Pulse Rate:  [7-85] 78 (09/16 1036) Resp:  [16-25] 20 (09/16 1036) BP: (102-126)/(43-76) 102/43 mmHg (09/16 1036) SpO2:  [80 %-95 %] 91 % (09/16 1452) FiO2 (%):  [55 %] 55 % (09/16 0958) Weight:  [204 lb 5.9 oz (92.7 kg)] 204 lb 5.9 oz (92.7 kg) (09/16 0012)  PHYSICAL EXAMINATION: General:  Obese, chronically ill appearing female, NAD  Neuro:  Awake, alert, appropriate, MAE  HEENT:  Mm moist, no JVD, no LA Cardiovascular:  s1s2 rrr Lungs:  resps even non labored on [place], diminished bases, few exp wheeze  Abdomen:  Obese, round, soft, NT Musculoskeletal:  Warm and dry, no sig edema, -homans    Recent Labs Lab 10/04/13 0737 10/05/13 0215 10/08/13 1030  NA 140 135* 135*  K 4.0 4.8 4.6  CL 104 99 95*  CO2 23 22 27   BUN 14 16 30*  CREATININE 1.16* 1.07 1.14*  GLUCOSE 90 175* 165*    Recent Labs Lab 10/03/13 2249 10/04/13 0737 10/05/13 0215  HGB 13.5 12.4 13.4  HCT 39.9 37.2 39.2  WBC 5.9 4.5 6.9  PLT 138* 121* 105*   Ct Angio Chest Pe W/cm &/or Wo Cm  10/08/2013   CLINICAL DATA:  Hypoxia  EXAM: CT ANGIOGRAPHY CHEST WITH CONTRAST  TECHNIQUE: Multidetector CT imaging of the chest was performed using the standard protocol during bolus administration of intravenous contrast. Multiplanar CT image reconstructions and MIPs were obtained to evaluate the vascular anatomy.  CONTRAST:  62mL OMNIPAQUE IOHEXOL 350 MG/ML SOLN  COMPARISON:  Chest CT October 21, 2012 and chest radiograph October 04, 2013  FINDINGS: There is pulmonary embolus arising from the distal aspect of the right main pulmonary artery extending into multiple right upper and lower lobe branches. There is a small pulmonary embolus in a peripheral branch of the  left lower lobe pulmonary artery. The right ventricle to left ventricular ratio is 0.85, slightly less than the threshold for right heart strain.  There is no appreciable thoracic aortic dissection. The ascending thoracic aorta is borderline prominent at 4.0 x 3.8 cm. There is no frank aneurysm.  There is consolidation with volume loss in the left lower lobe. There is patchy infiltrate in the medial segment of the right middle lobe inferiorly as well as in the anterior segment of the right upper lobe. More subtle patchy infiltrate is noted in the superior segment of the right lower lobe.  On axial slice 45 series 517, there is a 5 mm nodular opacity in the anterior segment of the right upper lobe. On axial  slice 52 series 732, there is a 3 mm nodular opacity in the lateral segment of the right middle lobe.  There is no appreciable thoracic adenopathy. The pericardium is not thickened.  In the visualized upper abdomen, the contour of the liver suggests underlying cirrhosis.  There are no blastic or lytic bone lesions. Visualized thyroid appears normal.  Review of the MIP images confirms the above findings.  IMPRESSION: Extensive pulmonary embolus on the right. Small peripheral pulmonary embolus on the left. The patient has right ventricle to left ventricle diameter ratio does not meet criteria for frank right heart strain.  Multiple areas of infiltrate with extensive consolidation in the left lower lobe. Nodular opacities, largest measuring 5 mm as noted above. Followup of these nodular opacity should be based on Fleischner Society guidelines. If the patient is at high risk for bronchogenic carcinoma, follow-up chest CT at 6-12 months is recommended. If the patient is at low risk for bronchogenic carcinoma, follow-up chest CT at 12 months is recommended. This recommendation follows the consensus statement: Guidelines for Management of Small Pulmonary Nodules Detected on CT Scans: A Statement from the [place] as published in Radiology 2005;237:395-400.  The appearance of the liver suggests underlying cirrhosis.  Critical Value/emergent results were called by telephone at the time of interpretation on 10/08/2013 at 12:11 pm to Dr. Niel Hummer , who verbally acknowledged these results.   Electronically Signed   By: Lowella Grip M.D.   On: 10/08/2013 12:12    ASSESSMENT / PLAN:  Acute hypoxemic resp failure - r/t acute PE, strep PNA and underlying COPD +/- AECOPD (doubt).  Improving.  Strep PNA - urine strep POS, extensive consolidation LLL  Pulmonary nodule - 42mm RUL, also 62mm RML  REC -  -Heparin gtt per pharmacy  -She would like to consider xarelto or other NOAC as she has very limited transportation and had difficulty with coumadin monitoring - could start xarelto at any time  -BLE venous dopplers - pending  -2D echo - pending  -No indication thrombolytics or cath directed rx at this time - resp status and hemodynamically very stable -Supplemental O2 as needed  -Change abx to levaquin and complete 7 day course  -BD - will change back to home symbicort, spiriva with PRN albuterol - no obvious bronchospasm  -Change steroids to PO with quick taper - suspect most of her SOB/hypoxia r/t PE +/- PNA and less likely AECOPD -lifelong anticoagulation as this is her second VTE event  -smoking cessation!! -check hypercoagulable panel given second relatively "unprovoked" PE -f/u 39mm nodule, new since CT chest 11/14 -- ?if this is a malignancy given her smoking hx and now thromboembolic event - will plan f/u CT in 3 months as oupt  -mammogram, colonoscopy    Nickolas Madrid, NP 10/08/2013  3:37 PM Pager: [phone] or [phone]  PCCM ATTENDING: I have interviewed and examined the patient and reviewed the database. I have formulated the assessment and plan as reflected in the note above with amendments made by me.   PCCM will sign off. Please call if we can be of further  assistance   Merton Border, MD ; [facility] [phone].  After 5:30 PM or weekends, call [phone]

## 2013-10-08 NOTE — Progress Notes (Signed)
*  Preliminary Results* Bilateral lower extremity venous duplex completed. Bilateral lower extremities are positive for deep vein thrombosis involving the right popliteal, left femoral, left popliteal, left posterior tibial, and left peroneal veins. There is no evidence of Baker's cyst bilaterally.  10/08/2013  Maudry Mayhew, RVT, RDCS, RDMS

## 2013-10-08 NOTE — Progress Notes (Signed)
ANTICOAGULATION CONSULT NOTE - Initial Consult  Pharmacy Consult for Heparin Indication: pulmonary embolus  Allergies  Allergen Reactions  . Duloxetine Other (See Comments)    REACTION: Headache  . Gabapentin     REACTION: rash in mouth  . Neomycin-Bacitracin Zn-Polymyx     REACTION: rash/hives    Patient Measurements: Height: 5\' 4"  (162.6 cm) Weight: 204 lb 5.9 oz (92.7 kg) IBW/kg (Calculated) : 54.7 Heparin Dosing Weight: 77 kg  Vital Signs: Temp: 97.4 F (36.3 C) (09/16 1036) Temp src: Oral (09/16 1036) BP: 102/43 mmHg (09/16 1036) Pulse Rate: 78 (09/16 1036)  Labs:  Recent Labs  10/08/13 1030  CREATININE 1.14*    Estimated Creatinine Clearance: 60.8 ml/min (by C-G formula based on Cr of 1.14).   Medical History: Past Medical History  Diagnosis Date  . Hypertension   . COPD (chronic obstructive pulmonary disease)   . Renal insufficiency   . Cirrhosis   . Pulmonary emboli   . Pneumonia     Assessment: 57 year old female beginning heparin for PE CrCl = 61 ml/ min  Goal of Therapy:  Heparin level 0.3-0.7 units/ml Monitor platelets by anticoagulation protocol: Yes   Plan:  1) Heparin 5000 units iv bolus x 1 2) Heparin drip at 1200 units / hr 3) Heparin level 6 hr after heparin starts 4) Daily heparin level, CBC  Thank you. Anette Guarneri, PharmD [phone]  Tad Moore 10/08/2013,1:06 PM

## 2013-10-08 NOTE — Progress Notes (Signed)
ANTICOAGULATION CONSULT NOTE - Follow Up Consult  Pharmacy Consult for heparin Indication: pulmonary embolus  Allergies  Allergen Reactions  . Duloxetine Other (See Comments)    REACTION: Headache  . Gabapentin     REACTION: rash in mouth  . Neomycin-Bacitracin Zn-Polymyx     REACTION: rash/hives    Patient Measurements: Height: 5\' 4"  (162.6 cm) Weight: 204 lb 5.9 oz (92.7 kg) IBW/kg (Calculated) : 54.7 Heparin Dosing Weight: 77 kg  Vital Signs: Temp: 98.1 F (36.7 C) (09/16 2059) Temp src: Oral (09/16 2059) BP: 102/68 mmHg (09/16 2059) Pulse Rate: 75 (09/16 2059)  Labs:  Recent Labs  10/08/13 1030 10/08/13 2035 10/08/13 2040  HGB  --   --  13.2  HCT  --   --  38.3  PLT  --   --  106*  APTT  --   --  150*  LABPROT  --   --  15.6*  INR  --   --  1.24  HEPARINUNFRC  --  1.32*  --   CREATININE 1.14*  --   --     Estimated Creatinine Clearance: 60.8 ml/min (by C-G formula based on Cr of 1.14).   Medications:  Infusions:  . heparin 1,200 Units/hr (10/08/13 1338)    Assessment: 58 y/o female on IV heparin for extensive PE. Bilateral LE duplex also reveals bilateral DVTs. Heparin level is SUPRAtherapeutic at 1.32 on 1200 units/hr which may be reflective of SQ heparin given this morning and the IV bolus given this afternoon. Confirmed the pump was programmed correctly and lab drew from the opposite arm. No bleeding noted, H/H are stable, platelets are low at 106K.  Goal of Therapy:  Heparin level 0.3-0.7 units/ml Monitor platelets by anticoagulation protocol: Yes   Plan:  - Hold heparin drip for 1 hour (turned off ~21:50) - Resume heparin drip at 950 units/hr (~22:50) - 6 hr heparin level - Daily heparin level and CBC - Monitor for s/sx of bleeding  [facility], Pharm.D., BCPS Clinical Pharmacist Pager: [phone] 10/08/2013 10:05 PM

## 2013-10-08 NOTE — Progress Notes (Signed)
ANTIBIOTIC CONSULT NOTE - INITIAL  Pharmacy Consult for Levaquin Indication: pneumonia  Allergies  Allergen Reactions  . Duloxetine Other (See Comments)    REACTION: Headache  . Gabapentin     REACTION: rash in mouth  . Neomycin-Bacitracin Zn-Polymyx     REACTION: rash/hives    Patient Measurements: Height: 5\' 4"  (162.6 cm) Weight: 204 lb 5.9 oz (92.7 kg) IBW/kg (Calculated) : 54.7  Vital Signs: Temp: 97.4 F (36.3 C) (09/16 1036) Temp src: Oral (09/16 1036) BP: 102/43 mmHg (09/16 1036) Pulse Rate: 78 (09/16 1036) Intake/Output from previous day: 09/15 0701 - 09/16 0700 In: 1910 [P.O.:1910] Out: 2625 [Urine:2625] Intake/Output from this shift: Total I/O In: 360 [P.O.:360] Out: -   Labs:  Recent Labs  10/08/13 1030  CREATININE 1.14*   Estimated Creatinine Clearance: 60.8 ml/min (by C-G formula based on Cr of 1.14). No results found for this basename: VANCOTROUGH, Corlis Leak, VANCORANDOM, [place], GENTPEAK, GENTRANDOM, TOBRATROUGH, TOBRAPEAK, TOBRARND, AMIKACINPEAK, AMIKACINTROU, AMIKACIN,  in the last 72 hours   Microbiology: Recent Results (from the past 720 hour(s))  MRSA PCR SCREENING     Status: None   Collection Time    10/04/13  3:55 AM      Result Value Ref Range Status   MRSA by PCR NEGATIVE  NEGATIVE Final   Comment:            The GeneXpert MRSA Assay (FDA     approved for NASAL specimens     only), is one component of a     comprehensive MRSA colonization     surveillance program. It is not     intended to diagnose MRSA     infection nor to guide or     monitor treatment for     MRSA infections.  CULTURE, BLOOD (ROUTINE X 2)     Status: None   Collection Time    10/04/13  7:37 AM      Result Value Ref Range Status   Specimen Description BLOOD RIGHT ARM   Final   Special Requests BOTTLES DRAWN AEROBIC AND ANAEROBIC 10CC   Final   Culture  Setup Time     Final   Value: 10/04/2013 15:17     Performed at [facility]   Culture      Final   Value:        BLOOD CULTURE RECEIVED NO GROWTH TO DATE CULTURE WILL BE HELD FOR 5 DAYS BEFORE ISSUING A FINAL NEGATIVE REPORT     Note: Culture results may be compromised due to an excessive volume of blood received in culture bottles.     Performed at [facility]   Report Status PENDING   Incomplete  CULTURE, BLOOD (ROUTINE X 2)     Status: None   Collection Time    10/04/13  7:46 AM      Result Value Ref Range Status   Specimen Description BLOOD RIGHT HAND   Final   Special Requests BOTTLES DRAWN AEROBIC AND ANAEROBIC 10CC   Final   Culture  Setup Time     Final   Value: 10/04/2013 15:18     Performed at [facility]   Culture     Final   Value:        BLOOD CULTURE RECEIVED NO GROWTH TO DATE CULTURE WILL BE HELD FOR 5 DAYS BEFORE ISSUING A FINAL NEGATIVE REPORT     Note: Culture results may be compromised due to an excessive volume of blood received  in culture bottles.     Performed at [facility]   Report Status PENDING   Incomplete    Medical History: Past Medical History  Diagnosis Date  . Hypertension   . COPD (chronic obstructive pulmonary disease)   . Renal insufficiency   . Cirrhosis   . Pulmonary emboli   . Pneumonia     Assessment: 58 y/o female admitted on 9/12 with R flank pain. She has been on azithromycin and ceftriaxone for CAP. Pharmacy consulted to change to Levaquin. Plan is to complete 7 days per pulmonology. She is afebrile, WBC are normal, and renal function is stable. Cultures are ngtd.  Goal of Therapy:  Eradication of infection  Plan:  - Levaquin 750 mg PO daily - Stop date is 9/18 after dose for the day  [facility], [place].D., BCPS Clinical Pharmacist Pager: [phone] 10/08/2013 3:54 PM

## 2013-10-08 NOTE — Progress Notes (Signed)
  Echocardiogram 2D Echocardiogram has been performed.  [name] 10/08/2013, 4:02 PM

## 2013-10-09 ENCOUNTER — Inpatient Hospital Stay (HOSPITAL_COMMUNITY): Payer: Medicare Other

## 2013-10-09 DIAGNOSIS — I2699 Other pulmonary embolism without acute cor pulmonale: Secondary | ICD-10-CM

## 2013-10-09 DIAGNOSIS — N183 Chronic kidney disease, stage 3 unspecified: Secondary | ICD-10-CM

## 2013-10-09 LAB — PROTEIN S, TOTAL: Protein S Ag, Total: 116 % (ref 60–150)

## 2013-10-09 LAB — CBC
HCT: 41.3 % (ref 36.0–46.0)
Hemoglobin: 13.7 g/dL (ref 12.0–15.0)
MCH: 30.4 pg (ref 26.0–34.0)
MCHC: 33.2 g/dL (ref 30.0–36.0)
MCV: 91.8 fL (ref 78.0–100.0)
Platelets: 108 10*3/uL — ABNORMAL LOW (ref 150–400)
RBC: 4.5 MIL/uL (ref 3.87–5.11)
RDW: 12.7 % (ref 11.5–15.5)
WBC: 7.5 10*3/uL (ref 4.0–10.5)

## 2013-10-09 LAB — HOMOCYSTEINE: Homocysteine: 15.7 umol/L — ABNORMAL HIGH (ref 4.0–15.4)

## 2013-10-09 LAB — GLUCOSE, CAPILLARY
GLUCOSE-CAPILLARY: 130 mg/dL — AB (ref 70–99)
GLUCOSE-CAPILLARY: 137 mg/dL — AB (ref 70–99)
GLUCOSE-CAPILLARY: 105 mg/dL — AB (ref 70–99)
Glucose-Capillary: 96 mg/dL (ref 70–99)

## 2013-10-09 LAB — CARDIOLIPIN ANTIBODIES, IGG, IGM, IGA
ANTICARDIOLIPIN IGG: 8 GPL U/mL — AB (ref <23)
Anticardiolipin IgA: 8 APL U/mL — ABNORMAL LOW (ref <22)
Anticardiolipin IgM: 0 MPL U/mL — ABNORMAL LOW (ref <11)

## 2013-10-09 LAB — HEPARIN LEVEL (UNFRACTIONATED)
Heparin Unfractionated: 1.03 IU/mL — ABNORMAL HIGH (ref 0.30–0.70)
Heparin Unfractionated: 0.87 IU/mL — ABNORMAL HIGH (ref 0.30–0.70)

## 2013-10-09 LAB — BETA-2-GLYCOPROTEIN I ABS, IGG/M/A
BETA-2-GLYCOPROTEIN I IGA: 13 A Units (ref <20)
Beta-2 Glyco I IgG: 6 G Units (ref <20)
Beta-2-Glycoprotein I IgM: 5 M Units (ref <20)

## 2013-10-09 LAB — LUPUS ANTICOAGULANT PANEL
DRVVT: 35.4 s (ref <42.9)
LUPUS ANTICOAGULANT: NOT DETECTED
PTT Lupus Anticoagulant: 151.9 secs — ABNORMAL HIGH (ref 28.0–43.0)
PTTLA 41 MIX: 134.5 s — AB (ref 28.0–43.0)
PTTLA Confirmation: 0.8 secs (ref <8.0)

## 2013-10-09 LAB — PROTEIN C, TOTAL: Protein C, Total: 104 % (ref 72–160)

## 2013-10-09 LAB — PROTEIN S ACTIVITY: PROTEIN S ACTIVITY: 87 % (ref 69–129)

## 2013-10-09 LAB — PROTEIN C ACTIVITY: Protein C Activity: 150 % — ABNORMAL HIGH (ref 75–133)

## 2013-10-09 MED ORDER — RIVAROXABAN 15 MG PO TABS
15.0000 mg | ORAL_TABLET | Freq: Two times a day (BID) | ORAL | Status: DC
  Administered 2013-10-09 – 2013-10-14 (×10): 15 mg via ORAL
  Filled 2013-10-09 (×10): qty 1

## 2013-10-09 MED ORDER — FLEET ENEMA 7-19 GM/118ML RE ENEM
1.0000 | ENEMA | Freq: Once | RECTAL | Status: AC
  Administered 2013-10-09: 1 via RECTAL
  Filled 2013-10-09: qty 1

## 2013-10-09 MED ORDER — RIVAROXABAN 20 MG PO TABS: 20.0000 mg | ORAL_TABLET | Freq: Every day | ORAL | Status: DC

## 2013-10-09 NOTE — Clinical Documentation Improvement (Signed)
  Pulmonary Embolus found on CT angio 10/08/13.  Started on Heparin.  Please document in the progress notes and discharge summary if the Pulmonary Embolus was:   - Present on Admission   - Note Present on Admission and developed during the hospital admission   - Unable to Clinically Determine  Thank you,   Erling Conte ,RN Clinical Documentation Specialist:  [facility]

## 2013-10-09 NOTE — Discharge Instructions (Signed)
Information on my medicine - XARELTO® (rivaroxaban) ° °This medication education was reviewed with me or my healthcare representative as part of my discharge preparation.  The pharmacist that spoke with me during my hospital stay was:  Chung Chagoya Kay, RPH ° °WHY WAS XARELTO® PRESCRIBED FOR YOU? °Xarelto® was prescribed to treat blood clots that may have been found in the veins of your legs (deep vein thrombosis) or in your lungs (pulmonary embolism) and to reduce the risk of them occurring again. ° °What do you need to know about Xarelto®? °The starting dose is one 15 mg tablet taken TWICE daily with food for the FIRST 21 DAYS then  the dose is changed to one 20 mg tablet taken ONCE A DAY with your evening meal. ° °DO NOT stop taking Xarelto® without talking to the health care provider who prescribed the medication.  Refill your prescription for 20 mg tablets before you run out. ° °After discharge, you should have regular check-up appointments with your healthcare provider that is prescribing your Xarelto®.  In the future your dose may need to be changed if your kidney function changes by a significant amount. ° °What do you do if you miss a dose? °If you are taking Xarelto® TWICE DAILY and you miss a dose, take it as soon as you remember. You may take two 15 mg tablets (total 30 mg) at the same time then resume your regularly scheduled 15 mg twice daily the next day. ° °If you are taking Xarelto® ONCE DAILY and you miss a dose, take it as soon as you remember on the same day then continue your regularly scheduled once daily regimen the next day. Do not take two doses of Xarelto® at the same time.  ° °Important Safety Information °Xarelto® is a blood thinner medicine that can cause bleeding. You should call your healthcare provider right away if you experience any of the following: °? Bleeding from an injury or your nose that does not stop. °? Unusual colored urine (red or dark brown) or unusual colored stools  (red or black). °? Unusual bruising for unknown reasons. °? A serious fall or if you hit your head (even if there is no bleeding). ° °Some medicines may interact with Xarelto® and might increase your risk of bleeding while on Xarelto®. To help avoid this, consult your healthcare provider or pharmacist prior to using any new prescription or non-prescription medications, including herbals, vitamins, non-steroidal anti-inflammatory drugs (NSAIDs) and supplements. ° °This website has more information on Xarelto®: www.xarelto.com. °

## 2013-10-09 NOTE — Progress Notes (Signed)
ANTICOAGULATION CONSULT NOTE - Follow Up Consult  Pharmacy Consult for heparin Indication: pulmonary embolus  Labs:  Recent Labs  10/08/13 1030 10/08/13 2035 10/08/13 2040 10/09/13 0430  HGB  --   --  13.2 13.7  HCT  --   --  38.3 41.3  PLT  --   --  106* 108*  APTT  --   --  150*  --   LABPROT  --   --  15.6*  --   INR  --   --  1.24  --   HEPARINUNFRC  --  1.32*  --  1.03*  CREATININE 1.14*  --   --   --     Assessment: 58yo female remains supratherapeutic on heparin despite rate decrease, surprising given pt size/renal fxn; no signs of bleeding per RN, CBC stable.  Goal of Therapy:  Heparin level 0.3-0.7 units/ml   Plan:  Will decrease heparin gtt by 2-3 units/kg/hr to 750 units/hr and check level in 6hr.  Wynona Neat, PharmD, BCPS  10/09/2013,5:26 AM

## 2013-10-09 NOTE — Progress Notes (Signed)
PROGRESS NOTE  [name] RSW:[id number] DOB: [birth year] DOA: 10/03/2013 PCP: No PCP Per Patient  Assessment/Plan:  Pulmonary Embolism: On heparin Gtt. Will transition to [place]. Patient understand risk for bleeding and that there is not antidote.  Patient relates that she had multiple problems with coumadin, her level was never right or too high or too low.  Unable to clinically determine if clot was present on admission.  Hypercoagulable panel pending.  ECHO no right side heart strain.   Acute hypoxic Respiratory Failure:  Not on home oxygen.  Treating for PNA, COPD exacerbation. PE.  CT angio positive for PE.   Thrombocytopenia; monitor, increase platelet consumption.   Community-acquired pneumonia: STREP PNA -CT abdomen, patient has right basilar infiltrate, indicating pneumonia.  - received :  ceftriaxone and azithromycin. Transition to Levaquin 9-17. Day 6 of antibiotics.  - blood culture x 2 NGTD - Fever control  -strep antigen positive   Constipation: fleet enema.   hypertension: Blood pressure is controlled  -Continue home medications   COPD: Continue home inhaler medications. Prednisone taper.   CKD-III: Baseline creatinine is about 1.2-1.7. His creatinine is 1.25 on admission, which is close to  baseline.  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 There is no obvious hepatic mass on CT abdomen. Currently seems to be compensated.  - monitor closely.   Patient does NOT have HIV as documented in the H&P.  Taking tenovir for Hep B  Code Status: full Family Communication: patient Disposition Plan:    Consultants:  none  Procedures:  none    HPI/Subjective: Breathing better. Cough improved.    Objective: Filed Vitals:   10/09/13 1401  BP: 113/75  Pulse: 72  Temp: 97.5 F (36.4 C)  Resp: 20    Intake/Output Summary (Last 24 hours) at 10/09/13 1429 Last data filed at 10/09/13 1400  Gross  per 24 hour  Intake 1235.38 ml  Output      0 ml  Net 1235.38 ml   Filed Weights   10/04/13 0403 10/05/13 0326 10/08/13 0012  Weight: 92.7 kg (204 lb 5.9 oz) 92.1 kg (203 lb 0.7 oz) 92.7 kg (204 lb 5.9 oz)    Exam:   General:  On NRB, in no distress, not using accessory muscle to breath.   Cardiovascular: rrr  Respiratory: bilateral air movement. Wheezing on left side.   Abdomen: +Bs, soft  Musculoskeletal: no edema   Data Reviewed: Basic Metabolic Panel:  Recent Labs Lab 10/03/13 2249 10/04/13 0737 10/05/13 0215 10/08/13 1030  NA 140 140 135* 135*  K 3.8 4.0 4.8 4.6  CL 100 104 99 95*  CO2 23 23 22 27   GLUCOSE 91 90 175* 165*  BUN 15 14 16  30*  CREATININE 1.25* 1.16* 1.07 1.14*  CALCIUM 9.6 9.1 9.6 9.4   Liver Function Tests:  Recent Labs Lab 10/03/13 2249  AST 18  ALT 18  ALKPHOS 94  BILITOT 0.5  PROT 7.6  ALBUMIN 3.8    Recent Labs Lab 10/03/13 2249  LIPASE 33   No results found for this basename: AMMONIA,  in the last 168 hours CBC:  Recent Labs Lab 10/03/13 2249 10/04/13 0737 10/05/13 0215 10/08/13 2040 10/09/13 0430  WBC 5.9 4.5 6.9 7.5 7.5  NEUTROABS 4.3 2.9  --   --   --   HGB 13.5 12.4 13.4 13.2 13.7  HCT 39.9 37.2 39.2 38.3 41.3  MCV 89.7 90.7 91.6 91.6 91.8  PLT 138* 121* 105* 106* 108*   Cardiac Enzymes:  Recent Labs Lab 10/04/13 0737  TROPONINI <0.30   BNP (last 3 results)  Recent Labs  12/21/12 1630 07/14/13 2120  PROBNP 149.3* 40.7   CBG:  Recent Labs Lab 10/08/13 1148 10/08/13 1729 10/08/13 2043 10/09/13 0702 10/09/13 1204  GLUCAP 153* 165* 171* 130* 96    Recent Results (from the past 240 hour(s))  MRSA PCR SCREENING     Status: None   Collection Time    10/04/13  3:55 AM      Result Value Ref Range Status   MRSA by PCR NEGATIVE  NEGATIVE Final   Comment:            The GeneXpert MRSA Assay (FDA     approved for NASAL specimens     only), is one component of a     comprehensive MRSA  colonization     surveillance program. It is not     intended to diagnose MRSA     infection nor to guide or     monitor treatment for     MRSA infections.  CULTURE, BLOOD (ROUTINE X 2)     Status: None   Collection Time    10/04/13  7:37 AM      Result Value Ref Range Status   Specimen Description BLOOD RIGHT ARM   Final   Special Requests BOTTLES DRAWN AEROBIC AND ANAEROBIC 10CC   Final   Culture  Setup Time     Final   Value: 10/04/2013 15:17     Performed at [facility]   Culture     Final   Value:        BLOOD CULTURE RECEIVED NO GROWTH TO DATE CULTURE WILL BE HELD FOR 5 DAYS BEFORE ISSUING A FINAL NEGATIVE REPORT     Note: Culture results may be compromised due to an excessive volume of blood received in culture bottles.     Performed at [facility]   Report Status PENDING   Incomplete  CULTURE, BLOOD (ROUTINE X 2)     Status: None   Collection Time    10/04/13  7:46 AM      Result Value Ref Range Status   Specimen Description BLOOD RIGHT HAND   Final   Special Requests BOTTLES DRAWN AEROBIC AND ANAEROBIC 10CC   Final   Culture  Setup Time     Final   Value: 10/04/2013 15:18     Performed at [facility]   Culture     Final   Value:        BLOOD CULTURE RECEIVED NO GROWTH TO DATE CULTURE WILL BE HELD FOR 5 DAYS BEFORE ISSUING A FINAL NEGATIVE REPORT     Note: Culture results may be compromised due to an excessive volume of blood received in culture bottles.     Performed at [facility]   Report Status PENDING   Incomplete     Studies: Ct Angio Chest Pe W/cm &/or Wo Cm  10/08/2013   CLINICAL DATA:  Hypoxia  EXAM: CT ANGIOGRAPHY CHEST WITH CONTRAST  TECHNIQUE: Multidetector CT imaging of the chest was performed using the standard protocol during bolus administration of intravenous contrast. Multiplanar CT image reconstructions and MIPs were obtained to evaluate the vascular anatomy.  CONTRAST:  80mL OMNIPAQUE IOHEXOL 350 MG/ML SOLN   COMPARISON:  Chest CT October 21, 2012 and chest radiograph October 04, 2013  FINDINGS: There is pulmonary embolus  arising from the distal aspect of the right main pulmonary artery extending into multiple right upper and lower lobe branches. There is a small pulmonary embolus in a peripheral branch of the left lower lobe pulmonary artery. The right ventricle to left ventricular ratio is 0.85, slightly less than the threshold for right heart strain.  There is no appreciable thoracic aortic dissection. The ascending thoracic aorta is borderline prominent at 4.0 x 3.8 cm. There is no frank aneurysm.  There is consolidation with volume loss in the left lower lobe. There is patchy infiltrate in the medial segment of the right middle lobe inferiorly as well as in the anterior segment of the right upper lobe. More subtle patchy infiltrate is noted in the superior segment of the right lower lobe.  On axial slice 45 series 161, there is a 5 mm nodular opacity in the anterior segment of the right upper lobe. On axial slice 52 series 096, there is a 3 mm nodular opacity in the lateral segment of the right middle lobe.  There is no appreciable thoracic adenopathy. The pericardium is not thickened.  In the visualized upper abdomen, the contour of the liver suggests underlying cirrhosis.  There are no blastic or lytic bone lesions. Visualized thyroid appears normal.  Review of the MIP images confirms the above findings.  IMPRESSION: Extensive pulmonary embolus on the right. Small peripheral pulmonary embolus on the left. The patient has right ventricle to left ventricle diameter ratio does not meet criteria for frank right heart strain.  Multiple areas of infiltrate with extensive consolidation in the left lower lobe. Nodular opacities, largest measuring 5 mm as noted above. Followup of these nodular opacity should be based on Fleischner Society guidelines. If the patient is at high risk for bronchogenic carcinoma, follow-up  chest CT at 6-12 months is recommended. If the patient is at low risk for bronchogenic carcinoma, follow-up chest CT at 12 months is recommended. This recommendation follows the consensus statement: Guidelines for Management of Small Pulmonary Nodules Detected on CT Scans: A Statement from the [employer] as published in Radiology 2005;237:395-400.  The appearance of the liver suggests underlying cirrhosis.  Critical Value/emergent results were called by telephone at the time of interpretation on 10/08/2013 at 12:11 pm to Dr. Niel Hummer , who verbally acknowledged these results.   Electronically Signed   By: Lowella Grip M.D.   On: 10/08/2013 12:12   Dg Chest Port 1 View  10/09/2013   CLINICAL DATA:  Cough congestion and shortness of breath. And respiratory failure. History of pulmonary embolism, pneumonia, COPD and tobacco use  EXAM: PORTABLE CHEST - 1 VIEW  COMPARISON:  PA and lateral chest x-rays of October 04, 2013  FINDINGS: The lungs are adequately inflated. There is subtle increased density that projects over the left lower hemithorax and just above the right hemidiaphragm. The heart and pulmonary vascularity are normal. There is mild tortuosity of the ascending thoracic aorta. There is no pneumothorax or significant pleural effusion. The observed bony thorax is unremarkable.  IMPRESSION: Hazy airspace disease at the lung bases especially on the left is consistent with atelectasis. There is no alveolar infiltrate nor evidence of CHF.   Electronically Signed   By: David  [place]   On: 10/09/2013 08:07    Scheduled Meds: . amitriptyline  100 mg Oral QHS  . budesonide-formoterol  2 puff Inhalation BID  . entecavir  0.5 mg Oral QODAY  . Iloperidone  2 tablet Oral Daily  . insulin aspart  0-15 Units Subcutaneous TID WC  . insulin aspart  0-5 Units Subcutaneous QHS  . levofloxacin  750 mg Oral QPM  . lidocaine  1 patch Transdermal Daily  . nicotine  7 mg Transdermal Daily  .  pantoprazole  40 mg Oral Daily  . predniSONE  40 mg Oral Q breakfast  . Rivaroxaban  15 mg Oral BID WC  . [START ON 10/31/2013] rivaroxaban  20 mg Oral Q supper  . senna-docusate  1 tablet Oral BID  . sodium phosphate  1 enema Rectal Once  . spironolactone  25 mg Oral Daily  . tenofovir  300 mg Oral QODAY  . tiotropium  18 mcg Inhalation Daily  . traMADol  50 mg Oral BID   Continuous Infusions:  Antibiotics Given (last 72 hours)   Date/Time Action Medication Dose Rate   10/06/13 2234 Given   azithromycin (ZITHROMAX) tablet 500 mg 500 mg    10/06/13 2347 Given   cefTRIAXone (ROCEPHIN) 1 g in dextrose 5 % 50 mL IVPB 1 g 100 mL/hr   10/07/13 1049 Given   tenofovir (VIREAD) tablet 300 mg 300 mg    10/07/13 2109 Given   azithromycin (ZITHROMAX) tablet 500 mg 500 mg    10/08/13 0018 Given   cefTRIAXone (ROCEPHIN) 1 g in dextrose 5 % 50 mL IVPB 1 g 100 mL/hr   10/08/13 1718 Given   levofloxacin (LEVAQUIN) tablet 750 mg 750 mg    10/09/13 1012 Given   tenofovir (VIREAD) tablet 300 mg 300 mg       Principal Problem:   Community acquired pneumonia Active Problems:   HYPERTENSION   CIRRHOSIS   CKD (chronic kidney disease), stage III   COPD (chronic obstructive pulmonary disease)   Kidney stone   Tobacco abuse    Time spent: 25 min    Niel Hummer A  [facility] Pager [phone] If 7PM-7AM, please contact night-coverage at [email or web address], password [facility] 10/09/2013, 2:29 PM  LOS: 6 days

## 2013-10-09 NOTE — Progress Notes (Signed)
ANTICOAGULATION CONSULT NOTE Pharmacy Consult for Heparin Indication: pulmonary embolus / DVTs  Allergies  Allergen Reactions  . Duloxetine Other (See Comments)    REACTION: Headache  . Gabapentin     REACTION: rash in mouth  . Neomycin-Bacitracin Zn-Polymyx     REACTION: rash/hives    Patient Measurements: Height: 5\' 4"  (162.6 cm) Weight: 204 lb 5.9 oz (92.7 kg) IBW/kg (Calculated) : 54.7 Heparin Dosing Weight: 77 kg  Vital Signs: Temp: 97.5 F (36.4 C) (09/17 1031) Temp src: Oral (09/17 1031) BP: 104/64 mmHg (09/17 1031) Pulse Rate: 70 (09/17 1031)  Labs:  Recent Labs  10/08/13 1030 10/08/13 2035 10/08/13 2040 10/09/13 0430 10/09/13 1115  HGB  --   --  13.2 13.7  --   HCT  --   --  38.3 41.3  --   PLT  --   --  106* 108*  --   APTT  --   --  150*  --   --   LABPROT  --   --  15.6*  --   --   INR  --   --  1.24  --   --   HEPARINUNFRC  --  1.32*  --  1.03* 0.87*  CREATININE 1.14*  --   --   --   --     Estimated Creatinine Clearance: 60.8 ml/min (by C-G formula based on Cr of 1.14).   Medical History: Past Medical History  Diagnosis Date  . Hypertension   . COPD (chronic obstructive pulmonary disease)   . Renal insufficiency   . Cirrhosis   . Pulmonary emboli   . Pneumonia     Assessment: 58 year old female continues on heparin for PE / DVTs Heparin level now 0.87   Awaiting transition to po -- ? Xarelto  Goal of Therapy:  Heparin level 0.3-0.7 units/ml Monitor platelets by anticoagulation protocol: Yes   Plan:  1) Decrease heparin to 600 units / hr 2) Heparin level at 2000  Thank you. Anette Guarneri, PharmD [phone]  Tad Moore 10/09/2013,12:49 PM

## 2013-10-09 NOTE — Progress Notes (Signed)
Pharmacy --> Heparin to Xarelto  Transitioning to Xarelto for PE / DVTs Previously on Coumadin -- patient states that it was very difficult to manager her INRs / get them therapeutic   Plan: DC Heparin Xarelto 15 mg po BID x 3 weeks then 20 mg daily Patient counseled about bleeding risk with Xarelto (given her cirrhosis).

## 2013-10-10 ENCOUNTER — Inpatient Hospital Stay (HOSPITAL_COMMUNITY): Payer: Medicare Other

## 2013-10-10 DIAGNOSIS — I2699 Other pulmonary embolism without acute cor pulmonale: Secondary | ICD-10-CM

## 2013-10-10 LAB — CBC
HCT: 37.9 % (ref 36.0–46.0)
Hemoglobin: 12.9 g/dL (ref 12.0–15.0)
MCH: 30.6 pg (ref 26.0–34.0)
MCHC: 34 g/dL (ref 30.0–36.0)
MCV: 90 fL (ref 78.0–100.0)
PLATELETS: 104 10*3/uL — AB (ref 150–400)
RBC: 4.21 MIL/uL (ref 3.87–5.11)
RDW: 12.6 % (ref 11.5–15.5)
WBC: 5.9 10*3/uL (ref 4.0–10.5)

## 2013-10-10 LAB — GLUCOSE, CAPILLARY
Glucose-Capillary: 88 mg/dL (ref 70–99)
Glucose-Capillary: 105 mg/dL — ABNORMAL HIGH (ref 70–99)
Glucose-Capillary: 150 mg/dL — ABNORMAL HIGH (ref 70–99)
Glucose-Capillary: 114 mg/dL — ABNORMAL HIGH (ref 70–99)

## 2013-10-10 LAB — CULTURE, BLOOD (ROUTINE X 2)
CULTURE: NO GROWTH
Culture: NO GROWTH

## 2013-10-10 MED ORDER — ALBUTEROL SULFATE (2.5 MG/3ML) 0.083% IN NEBU
2.5000 mg | INHALATION_SOLUTION | Freq: Four times a day (QID) | RESPIRATORY_TRACT | Status: DC
  Administered 2013-10-10 – 2013-10-13 (×12): 2.5 mg via RESPIRATORY_TRACT
  Filled 2013-10-10 (×12): qty 3

## 2013-10-10 MED ORDER — SODIUM CHLORIDE 0.9 % IV BOLUS (SEPSIS)
500.0000 mL | Freq: Once | INTRAVENOUS | Status: AC
  Administered 2013-10-10: 500 mL via INTRAVENOUS

## 2013-10-10 MED ORDER — METHYLPREDNISOLONE SODIUM SUCC 40 MG IJ SOLR
40.0000 mg | Freq: Once | INTRAMUSCULAR | Status: AC
  Administered 2013-10-10: 40 mg via INTRAVENOUS
  Filled 2013-10-10: qty 1

## 2013-10-10 MED ORDER — FAMOTIDINE 20 MG PO TABS
20.0000 mg | ORAL_TABLET | Freq: Two times a day (BID) | ORAL | Status: DC | PRN
  Administered 2013-10-10 – 2013-10-13 (×4): 20 mg via ORAL
  Filled 2013-10-10 (×4): qty 1

## 2013-10-10 MED ORDER — POLYETHYLENE GLYCOL 3350 17 G PO PACK
17.0000 g | PACK | Freq: Two times a day (BID) | ORAL | Status: DC
  Administered 2013-10-10 – 2013-10-14 (×9): 17 g via ORAL
  Filled 2013-10-10 (×9): qty 1

## 2013-10-10 MED ORDER — BISACODYL 10 MG RE SUPP
10.0000 mg | Freq: Once | RECTAL | Status: AC
  Administered 2013-10-10: 10 mg via RECTAL
  Filled 2013-10-10: qty 1

## 2013-10-10 MED ORDER — ILOPERIDONE 6 MG PO TABS
1.0000 | ORAL_TABLET | Freq: Two times a day (BID) | ORAL | Status: DC
  Administered 2013-10-10 – 2013-10-14 (×8): 6 mg via ORAL
  Filled 2013-10-10 (×5): qty 1

## 2013-10-10 NOTE — Progress Notes (Addendum)
Pt now on NRB sats are 95%, RR RN called and en route.

## 2013-10-10 NOTE — Progress Notes (Signed)
PROGRESS NOTE  [name] OQH:[id number] DOB: 10-13-1955 DOA: 10/03/2013 PCP: No PCP Per Patient  Assessment/Plan: Pulmonary Embolism: On heparin Gtt. Will transition to [place]. Patient understand risk for bleeding and that there is not antidote.  Patient relates that she had multiple problems with coumadin, her level was never right or too high or too low.  Unable to clinically determine if clot was present on admission.  Hypercoagulable panel: Antithrombin III 97, Protein C 150, lupus anticoagulant not detected. Homocysteine 157, Factor V Leiden pending.  ECHO no right side heart strain.   Acute hypoxic Respiratory Failure:  -Patient become more hypoxic, increase oxygen requirement. Stat Chest x ray: Persistent left lower lobe atelectasis. Improvement in right lower lobe atelectasis. No other new findings. CCM re-consulted. Patient BP decrease to 87. IV bolus in progres.  -will discussed further care with CCM.  -Treating for PNA, COPD exacerbation. PE.  -CT angio positive for PE.   Thrombocytopenia; monitor, increase platelet consumption.   Community-acquired pneumonia: STREP PNA -CT abdomen, patient has right basilar infiltrate, indicating pneumonia.  - received :  ceftriaxone and azithromycin. Transition to Levaquin 9-17. Day 6 of antibiotics.  - blood culture x 2 NGTD - Fever control  -strep antigen positive   Constipation: no BM yet. Will schedule miralax, continue with senna. KUB ordered.   hypertension: Blood pressure is controlled  -BP soft. Will hold spironolactone.   COPD: Continue home inhaler medications. Prednisone taper.   CKD-III: Baseline creatinine is about 1.2-1.7. His creatinine is 1.25 on admission, which is close to  baseline.  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 There is no obvious hepatic mass on CT abdomen. Currently seems to be compensated.  - monitor closely.   Patient does NOT have HIV as  documented in the H&P.  Taking tenovir for Hep B  Code Status: full Family Communication: patient Disposition Plan: remain inpatient.    Consultants:  none  Procedures:  none    HPI/Subjective: Patient become hypoxic when she was transition from bedside commode to bed. She denies worsening dyspnea, some weakness. Her Oxygen remain at 87 on 5 L, she was then transition to NB mask.  Patient alert answering question, denies worsening dyspnea.   Objective: Filed Vitals:   10/10/13 0935  BP: 111/81  Pulse: 75  Temp: 97.5 F (36.4 C)  Resp: 18    Intake/Output Summary (Last 24 hours) at 10/10/13 1050 Last data filed at 10/10/13 0208  Gross per 24 hour  Intake 872.98 ml  Output    501 ml  Net 371.98 ml   Filed Weights   10/04/13 0403 10/05/13 0326 10/08/13 0012  Weight: 92.7 kg (204 lb 5.9 oz) 92.1 kg (203 lb 0.7 oz) 92.7 kg (204 lb 5.9 oz)    Exam:   General:  On NRB, in no distress, not using accessory muscle to breath.   Cardiovascular: rrr  Respiratory: bilateral air movement. Wheezing expiratory BL.   Abdomen: +Bs, soft  Musculoskeletal: no edema   Data Reviewed: Basic Metabolic Panel:  Recent Labs Lab 10/03/13 2249 10/04/13 0737 10/05/13 0215 10/08/13 1030  NA 140 140 135* 135*  K 3.8 4.0 4.8 4.6  CL 100 104 99 95*  CO2 23 23 22 27   GLUCOSE 91 90 175* 165*  BUN 15 14 16  30*  CREATININE 1.25* 1.16* 1.07 1.14*  CALCIUM 9.6 9.1 9.6 9.4   Liver Function Tests:  Recent Labs Lab 10/03/13 2249  AST 18  ALT 18  ALKPHOS 94  BILITOT 0.5  PROT 7.6  ALBUMIN 3.8    Recent Labs Lab 10/03/13 2249  LIPASE 33   No results found for this basename: AMMONIA,  in the last 168 hours CBC:  Recent Labs Lab 10/03/13 2249 10/04/13 0737 10/05/13 0215 10/08/13 2040 10/09/13 0430 10/10/13 0701  WBC 5.9 4.5 6.9 7.5 7.5 5.9  NEUTROABS 4.3 2.9  --   --   --   --   HGB 13.5 12.4 13.4 13.2 13.7 12.9  HCT 39.9 37.2 39.2 38.3 41.3 37.9  MCV 89.7  90.7 91.6 91.6 91.8 90.0  PLT 138* 121* 105* 106* 108* 104*   Cardiac Enzymes:  Recent Labs Lab 10/04/13 0737  TROPONINI <0.30   BNP (last 3 results)  Recent Labs  12/21/12 1630 07/14/13 2120  PROBNP 149.3* 40.7   CBG:  Recent Labs Lab 10/09/13 0702 10/09/13 1204 10/09/13 1639 10/09/13 2136 10/10/13 0649  GLUCAP 130* 96 137* 105* 88    Recent Results (from the past 240 hour(s))  MRSA PCR SCREENING     Status: None   Collection Time    10/04/13  3:55 AM      Result Value Ref Range Status   MRSA by PCR NEGATIVE  NEGATIVE Final   Comment:            The GeneXpert MRSA Assay (FDA     approved for NASAL specimens     only), is one component of a     comprehensive MRSA colonization     surveillance program. It is not     intended to diagnose MRSA     infection nor to guide or     monitor treatment for     MRSA infections.  CULTURE, BLOOD (ROUTINE X 2)     Status: None   Collection Time    10/04/13  7:37 AM      Result Value Ref Range Status   Specimen Description BLOOD RIGHT ARM   Final   Special Requests BOTTLES DRAWN AEROBIC AND ANAEROBIC 10CC   Final   Culture  Setup Time     Final   Value: 10/04/2013 15:17     Performed at [facility]   Culture     Final   Value: NO GROWTH 5 DAYS     Note: Culture results may be compromised due to an excessive volume of blood received in culture bottles.     Performed at [facility]   Report Status 10/10/2013 FINAL   Final  CULTURE, BLOOD (ROUTINE X 2)     Status: None   Collection Time    10/04/13  7:46 AM      Result Value Ref Range Status   Specimen Description BLOOD RIGHT HAND   Final   Special Requests BOTTLES DRAWN AEROBIC AND ANAEROBIC 10CC   Final   Culture  Setup Time     Final   Value: 10/04/2013 15:18     Performed at [facility]   Culture     Final   Value: NO GROWTH 5 DAYS     Note: Culture results may be compromised due to an excessive volume of blood received in culture  bottles.     Performed at [facility]   Report Status 10/10/2013 FINAL   Final     Studies: Ct Angio Chest Pe W/cm &/or Wo Cm  10/08/2013   CLINICAL DATA:  Hypoxia  EXAM: CT  ANGIOGRAPHY CHEST WITH CONTRAST  TECHNIQUE: Multidetector CT imaging of the chest was performed using the standard protocol during bolus administration of intravenous contrast. Multiplanar CT image reconstructions and MIPs were obtained to evaluate the vascular anatomy.  CONTRAST:  22mL OMNIPAQUE IOHEXOL 350 MG/ML SOLN  COMPARISON:  Chest CT October 21, 2012 and chest radiograph October 04, 2013  FINDINGS: There is pulmonary embolus arising from the distal aspect of the right main pulmonary artery extending into multiple right upper and lower lobe branches. There is a small pulmonary embolus in a peripheral branch of the left lower lobe pulmonary artery. The right ventricle to left ventricular ratio is 0.85, slightly less than the threshold for right heart strain.  There is no appreciable thoracic aortic dissection. The ascending thoracic aorta is borderline prominent at 4.0 x 3.8 cm. There is no frank aneurysm.  There is consolidation with volume loss in the left lower lobe. There is patchy infiltrate in the medial segment of the right middle lobe inferiorly as well as in the anterior segment of the right upper lobe. More subtle patchy infiltrate is noted in the superior segment of the right lower lobe.  On axial slice 45 series 053, there is a 5 mm nodular opacity in the anterior segment of the right upper lobe. On axial slice 52 series 976, there is a 3 mm nodular opacity in the lateral segment of the right middle lobe.  There is no appreciable thoracic adenopathy. The pericardium is not thickened.  In the visualized upper abdomen, the contour of the liver suggests underlying cirrhosis.  There are no blastic or lytic bone lesions. Visualized thyroid appears normal.  Review of the MIP images confirms the above findings.   IMPRESSION: Extensive pulmonary embolus on the right. Small peripheral pulmonary embolus on the left. The patient has right ventricle to left ventricle diameter ratio does not meet criteria for frank right heart strain.  Multiple areas of infiltrate with extensive consolidation in the left lower lobe. Nodular opacities, largest measuring 5 mm as noted above. Followup of these nodular opacity should be based on Fleischner Society guidelines. If the patient is at high risk for bronchogenic carcinoma, follow-up chest CT at 6-12 months is recommended. If the patient is at low risk for bronchogenic carcinoma, follow-up chest CT at 12 months is recommended. This recommendation follows the consensus statement: Guidelines for Management of Small Pulmonary Nodules Detected on CT Scans: A Statement from the [place] as published in Radiology 2005;237:395-400.  The appearance of the liver suggests underlying cirrhosis.  Critical Value/emergent results were called by telephone at the time of interpretation on 10/08/2013 at 12:11 pm to Dr. Niel Hummer , who verbally acknowledged these results.   Electronically Signed   By: Lowella Grip M.D.   On: 10/08/2013 12:12   Dg Chest Port 1 View  10/09/2013   CLINICAL DATA:  Cough congestion and shortness of breath. And respiratory failure. History of pulmonary embolism, pneumonia, COPD and tobacco use  EXAM: PORTABLE CHEST - 1 VIEW  COMPARISON:  PA and lateral chest x-rays of October 04, 2013  FINDINGS: The lungs are adequately inflated. There is subtle increased density that projects over the left lower hemithorax and just above the right hemidiaphragm. The heart and pulmonary vascularity are normal. There is mild tortuosity of the ascending thoracic aorta. There is no pneumothorax or significant pleural effusion. The observed bony thorax is unremarkable.  IMPRESSION: Hazy airspace disease at the lung bases especially on the left is consistent  with atelectasis.  There is no alveolar infiltrate nor evidence of CHF.   Electronically Signed   By: David  Martinique   On: 10/09/2013 08:07    Scheduled Meds: . albuterol  2.5 mg Nebulization Q6H  . amitriptyline  100 mg Oral QHS  . budesonide-formoterol  2 puff Inhalation BID  . entecavir  0.5 mg Oral QODAY  . Iloperidone  1 tablet Oral BID  . insulin aspart  0-15 Units Subcutaneous TID WC  . insulin aspart  0-5 Units Subcutaneous QHS  . levofloxacin  750 mg Oral QPM  . lidocaine  1 patch Transdermal Daily  . nicotine  7 mg Transdermal Daily  . pantoprazole  40 mg Oral Daily  . polyethylene glycol  17 g Oral BID  . predniSONE  40 mg Oral Q breakfast  . Rivaroxaban  15 mg Oral BID WC  . [START ON 10/31/2013] rivaroxaban  20 mg Oral Q supper  . senna-docusate  1 tablet Oral BID  . sodium phosphate  1 enema Rectal Once  . spironolactone  25 mg Oral Daily  . tenofovir  300 mg Oral QODAY  . tiotropium  18 mcg Inhalation Daily  . traMADol  50 mg Oral BID   Continuous Infusions:  Antibiotics Given (last 72 hours)   Date/Time Action Medication Dose Rate   10/07/13 2109 Given   azithromycin (ZITHROMAX) tablet 500 mg 500 mg    10/08/13 0018 Given   cefTRIAXone (ROCEPHIN) 1 g in dextrose 5 % 50 mL IVPB 1 g 100 mL/hr   10/08/13 1718 Given   levofloxacin (LEVAQUIN) tablet 750 mg 750 mg    10/09/13 1012 Given   tenofovir (VIREAD) tablet 300 mg 300 mg    10/09/13 1750 Given   levofloxacin (LEVAQUIN) tablet 750 mg 750 mg       Principal Problem:   Community acquired pneumonia Active Problems:   HYPERTENSION   CIRRHOSIS   CKD (chronic kidney disease), stage III   COPD (chronic obstructive pulmonary disease)   Kidney stone   Tobacco abuse   Acute pulmonary embolism    Time spent: 35 min    Leam Madero, [facility] Pager [phone] If 7PM-7AM, please contact night-coverage at [email or web address], password [facility] 10/10/2013, 10:50 AM  LOS: 7 days

## 2013-10-10 NOTE — Progress Notes (Signed)
BP was 86/46, 500 cc bolus given BP now 120/67.  MD notified.

## 2013-10-10 NOTE — Progress Notes (Addendum)
Pt up to [facility], sats dropped to the mid 80s, sustaining around 88 with [place] at 6 lpm at this time, MD and RRT aware.  RRT on the way to place pt on Venturi mask.  V/S taken BP 117/82, HR 81, pt breathing at 24 bpm.  Albuterol neb treatment given with no response/increase in O2 sats.  Pt not c/o SOB, lightheadedness or dizziness.  Pt now 89% on 50% Venti mask.  RRT in room, STAT chest XR in process.  Will update/monitor.

## 2013-10-10 NOTE — Progress Notes (Signed)
CCM and RR at bedside

## 2013-10-10 NOTE — Progress Notes (Addendum)
Name: [name] MRN: [id number] DOB: 1955-09-08    ADMISSION DATE:  10/03/2013 CONSULTATION DATE:  10/08/13  REFERRING MD :  Tyrell Antonio  PRIMARY SERVICE:  [facility]   CHIEF COMPLAINT:  Respiratory failure   BRIEF PATIENT DESCRIPTION: 58yo female active smoker with hx HTN, CKD, COPD, PE (rx 1 year coumadin, off x 6 months), Hep B who presented 9/12 with back pain, chest pain.  Initially treated as CAP +/- AECOPD but had worsening hypoxia and CTA chest 9/16 revealed extensive PE.  PCCM consulted for assist.   SIGNIFICANT EVENTS / STUDIES:  CT renal stone study 9/12>>>Right basilar infiltrate. Advanced cirrhotic changes involving the liver without obvious hepatic mass. Mild associated splenomegaly. Bilateral renal calculi but no obstructing ureteral calculi.  CTA chest 9/16>>>extensive R sided PE, small peripheral L PE, no frank R heart strain, extensive consolidation and several nodular opacities, largest 79mm RUL   9/16: ECHO: EF 65-70%, nml LV fxn, mild LV hypertrophy 9/16: bilateral LE DVT  9/18: called to room as pt's sats dropped w/ activity and had transient hypotension, although asymptomatic.   LINES / TUBES:   CULTURES: Urine strep 9/12>>>POS BCx2 9/12>>>ng  ANTIBIOTICS: Rocephin 9/12>>> Azithro 9/12>>>   SUBJECTIVE:  No distress. Requiring venti mask  VITAL SIGNS: Temp:  [97.5 F (36.4 C)-98.1 F (36.7 C)] 97.5 F (36.4 C) (09/18 0935) Pulse Rate:  [69-82] 75 (09/18 0935) Resp:  [18-24] 24 (09/18 1108) BP: (87-117)/(45-82) 87/45 mmHg (09/18 1138) SpO2:  [87 %-97 %] 95 % (09/18 1138) 50%   PHYSICAL EXAMINATION: General:  Obese, chronically ill appearing female, NAD  Neuro:  Awake, alert, appropriate, MAE  HEENT:  Mm moist, no JVD, no LA Cardiovascular:  s1s2 rrr Lungs:  resps even non labored on [place], diminished bases, few exp wheeze w/out accessory muscle use  Abdomen:  Obese, round, soft, NT Musculoskeletal:  Warm and dry, no sig edema, -homans    Recent  Labs Lab 10/04/13 0737 10/05/13 0215 10/08/13 1030  NA 140 135* 135*  K 4.0 4.8 4.6  CL 104 99 95*  CO2 23 22 27   BUN 14 16 30*  CREATININE 1.16* 1.07 1.14*  GLUCOSE 90 175* 165*    Recent Labs Lab 10/08/13 2040 10/09/13 0430 10/10/13 0701  HGB 13.2 13.7 12.9  HCT 38.3 41.3 37.9  WBC 7.5 7.5 5.9  PLT 106* 108* 104*   Ct Angio Chest Pe W/cm &/or Wo Cm  10/08/2013   CLINICAL DATA:  Hypoxia  EXAM: CT ANGIOGRAPHY CHEST WITH CONTRAST  TECHNIQUE: Multidetector CT imaging of the chest was performed using the standard protocol during bolus administration of intravenous contrast. Multiplanar CT image reconstructions and MIPs were obtained to evaluate the vascular anatomy.  CONTRAST:  42mL OMNIPAQUE IOHEXOL 350 MG/ML SOLN  COMPARISON:  Chest CT October 21, 2012 and chest radiograph October 04, 2013  FINDINGS: There is pulmonary embolus arising from the distal aspect of the right main pulmonary artery extending into multiple right upper and lower lobe branches. There is a small pulmonary embolus in a peripheral branch of the left lower lobe pulmonary artery. The right ventricle to left ventricular ratio is 0.85, slightly less than the threshold for right heart strain.  There is no appreciable thoracic aortic dissection. The ascending thoracic aorta is borderline prominent at 4.0 x 3.8 cm. There is no frank aneurysm.  There is consolidation with volume loss in the left lower lobe. There is patchy infiltrate in the medial segment of the right middle lobe inferiorly  as well as in the anterior segment of the right upper lobe. More subtle patchy infiltrate is noted in the superior segment of the right lower lobe.  On axial slice 45 series 314, there is a 5 mm nodular opacity in the anterior segment of the right upper lobe. On axial slice 52 series 970, there is a 3 mm nodular opacity in the lateral segment of the right middle lobe.  There is no appreciable thoracic adenopathy. The pericardium is not  thickened.  In the visualized upper abdomen, the contour of the liver suggests underlying cirrhosis.  There are no blastic or lytic bone lesions. Visualized thyroid appears normal.  Review of the MIP images confirms the above findings.  IMPRESSION: Extensive pulmonary embolus on the right. Small peripheral pulmonary embolus on the left. The patient has right ventricle to left ventricle diameter ratio does not meet criteria for frank right heart strain.  Multiple areas of infiltrate with extensive consolidation in the left lower lobe. Nodular opacities, largest measuring 5 mm as noted above. Followup of these nodular opacity should be based on Fleischner Society guidelines. If the patient is at high risk for bronchogenic carcinoma, follow-up chest CT at 6-12 months is recommended. If the patient is at low risk for bronchogenic carcinoma, follow-up chest CT at 12 months is recommended. This recommendation follows the consensus statement: Guidelines for Management of Small Pulmonary Nodules Detected on CT Scans: A Statement from the [place] as published in Radiology 2005;237:395-400.  The appearance of the liver suggests underlying cirrhosis.  Critical Value/emergent results were called by telephone at the time of interpretation on 10/08/2013 at 12:11 pm to Dr. Niel Hummer , who verbally acknowledged these results.   Electronically Signed   By: Lowella Grip M.D.   On: 10/08/2013 12:12   Dg Chest Port 1 View  10/09/2013   CLINICAL DATA:  Cough congestion and shortness of breath. And respiratory failure. History of pulmonary embolism, pneumonia, COPD and tobacco use  EXAM: PORTABLE CHEST - 1 VIEW  COMPARISON:  PA and lateral chest x-rays of October 04, 2013  FINDINGS: The lungs are adequately inflated. There is subtle increased density that projects over the left lower hemithorax and just above the right hemidiaphragm. The heart and pulmonary vascularity are normal. There is mild tortuosity of the  ascending thoracic aorta. There is no pneumothorax or significant pleural effusion. The observed bony thorax is unremarkable.  IMPRESSION: Hazy airspace disease at the lung bases especially on the left is consistent with atelectasis. There is no alveolar infiltrate nor evidence of CHF.   Electronically Signed   By: David  Martinique   On: 10/09/2013 08:07    ASSESSMENT / PLAN:  Acute hypoxemic resp failure in setting of acute PE, strep PNA (LLL) underlying COPD Bilateral LE DVT Pulmonary nodule - 58mm RUL, also 81mm RML Exertional hypoxia.     Her O2 sats dropped to 84% on 5 liters w/ activity. She did not have associated CP or worsening of symptoms. In fact she continues to feel better. Think the worsening hypoxia is a combination of COPD, atelectasis/ pneumonia , decreased compliance due to abdominal distension and does NOT represent a new thromboembolic event.   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  cont symbicort, spiriva with PRN albuterol - no obvious bronchospasm  Cont steroids - 1 dose of solumedrol given smoking cessation!! f/u 73mm nodule,  new since CT chest 11/14 -- ? if this is a malignancy given her smoking hx and now thromboembolic event - will plan f/u CT in 3 months as oupt  mammogram, colonoscopy as out-pt   Abd distension/ constipation - no BM x 1 week Ct laxative, enema  Hypotension -Suspect the border line hypotension is some relative hypovolemia.  500 ml fluid challenge, hold aldactone    D/w Dr Christianne Dolin MD

## 2013-10-10 NOTE — Progress Notes (Signed)
BENEFIT CHECK FOR XARELTO --10/10/2013 0804 by Madelin Headings CMA per rep at [facility]  xarelto: auth required [id number]/ patient is in catastrophic phase: $0 co-pay  patient can use: [facility] (patient has been using this pharmacy)

## 2013-10-10 NOTE — Significant Event (Signed)
Rapid Response Event Note  Overview: Time Called: 1125 Arrival Time: 7741 Event Type: Respiratory  Initial Focused Assessment: Patient with PE and DVT this admit, on xeralto Up with staff to [facility] patient desat on [place].   O2 sats improved with NRB, patient denies SOB or CP BP 87/45  HR 75  RR 20  O2 sat 95% on NRB Lung sounds with expiratory wheezes. Dr Tyrell Antonio at [place] NP at bedside.  Interventions: Plan wean O2 aggressively since patient asymptomatic. Placed on 50% venturi, O2 sats 88-91% 500cc NS bolus (BP improved to 120/57) Will continue to monitor 1630:  Patient on 6L [place] O2 sat 89% resting in bed.  Assisted patient to chair, O2 sats decreased to 84% then improved to 95% on 6L.  Patient denies CP or SOB. RN to call if assistance needed.  Event Summary: Name of Physician Notified: DR Tyrell Antonio at bedside at    Name of Consulting Physician Notified: Dr Elsworth Soho and Laurey Arrow at bedside to assess patient at 1125  Outcome: Stayed in room and stabalized  Event End Time: [facility]  [place]aliegh Ip

## 2013-10-11 LAB — BASIC METABOLIC PANEL
ANION GAP: 16 — AB (ref 5–15)
Anion gap: 15 (ref 5–15)
BUN: 24 mg/dL — ABNORMAL HIGH (ref 6–23)
BUN: 23 mg/dL (ref 6–23)
CHLORIDE: 90 meq/L — AB (ref 96–112)
CO2: 23 meq/L (ref 19–32)
CO2: 25 mEq/L (ref 19–32)
Calcium: 9.1 mg/dL (ref 8.4–10.5)
Calcium: 9.2 mg/dL (ref 8.4–10.5)
Chloride: 90 mEq/L — ABNORMAL LOW (ref 96–112)
Creatinine, Ser: 1.02 mg/dL (ref 0.50–1.10)
Creatinine, Ser: 1.32 mg/dL — ABNORMAL HIGH (ref 0.50–1.10)
GFR calc Af Amer: 70 mL/min — ABNORMAL LOW (ref >90)
GFR calc Af Amer: 51 mL/min — ABNORMAL LOW (ref >90)
GFR calc non Af Amer: 60 mL/min — ABNORMAL LOW (ref >90)
GFR, EST NON AFRICAN AMERICAN: 44 mL/min — AB (ref >90)
GLUCOSE: 153 mg/dL — AB (ref 70–99)
Glucose, Bld: 188 mg/dL — ABNORMAL HIGH (ref 70–99)
POTASSIUM: 6.3 meq/L — AB (ref 3.7–5.3)
POTASSIUM: 4.9 meq/L (ref 3.7–5.3)
SODIUM: 128 meq/L — AB (ref 137–147)
SODIUM: 131 meq/L — AB (ref 137–147)

## 2013-10-11 LAB — CBC
HCT: 38.7 % (ref 36.0–46.0)
Hemoglobin: 12.8 g/dL (ref 12.0–15.0)
MCH: 29.9 pg (ref 26.0–34.0)
MCHC: 33.1 g/dL (ref 30.0–36.0)
MCV: 90.4 fL (ref 78.0–100.0)
PLATELETS: 104 10*3/uL — AB (ref 150–400)
RBC: 4.28 MIL/uL (ref 3.87–5.11)
RDW: 12.6 % (ref 11.5–15.5)
WBC: 7.8 10*3/uL (ref 4.0–10.5)

## 2013-10-11 LAB — GLUCOSE, CAPILLARY
GLUCOSE-CAPILLARY: 145 mg/dL — AB (ref 70–99)
Glucose-Capillary: 87 mg/dL (ref 70–99)
Glucose-Capillary: 222 mg/dL — ABNORMAL HIGH (ref 70–99)
Glucose-Capillary: 116 mg/dL — ABNORMAL HIGH (ref 70–99)

## 2013-10-11 MED ORDER — SENNA 8.6 MG PO TABS
3.0000 | ORAL_TABLET | Freq: Every day | ORAL | Status: DC
  Administered 2013-10-11 – 2013-10-13 (×3): 25.8 mg via ORAL
  Filled 2013-10-11 (×3): qty 3

## 2013-10-11 MED ORDER — BISACODYL 5 MG PO TBEC
10.0000 mg | DELAYED_RELEASE_TABLET | Freq: Three times a day (TID) | ORAL | Status: DC
  Administered 2013-10-11 – 2013-10-14 (×9): 10 mg via ORAL
  Filled 2013-10-11 (×9): qty 2

## 2013-10-11 NOTE — Progress Notes (Signed)
Name: [name] MRN: [id number] DOB: March 23, 1955    ADMISSION DATE:  10/03/2013 CONSULTATION DATE:  10/08/13  REFERRING MD :  Tyrell Antonio  PRIMARY SERVICE:  [facility]   CHIEF COMPLAINT:  Respiratory failure   BRIEF PATIENT DESCRIPTION: 58yo female active smoker with hx HTN, CKD, COPD, PE (rx 1 year coumadin, off x 6 months), Hep B who presented 9/12 with back pain, chest pain.  Initially treated as CAP +/- AECOPD but had worsening hypoxia and CTA chest 9/16 revealed extensive PE.  PCCM consulted for assist.   SIGNIFICANT EVENTS / STUDIES:  CT renal stone study 9/12>>>Right basilar infiltrate. Advanced cirrhotic changes involving the liver without obvious hepatic mass. Mild associated splenomegaly. Bilateral renal calculi but no obstructing ureteral calculi.  CTA chest 9/16>>>extensive R sided PE, small peripheral L PE, no frank R heart strain, extensive consolidation and several nodular opacities, largest 41mm RUL   9/16: ECHO: EF 65-70%, nml LV fxn, mild LV hypertrophy 9/16: bilateral LE DVT  9/18: called to room as pt's sats dropped w/ activity and had transient hypotension, although asymptomatic.   LINES / TUBES: piv  CULTURES: Urine strep 9/12>>>POS BCx2 9/12>>>ng  ANTIBIOTICS: Rocephin 9/12>>>9/16 Azithro 9/12>>>9./16 levaquin 9/16>>   SUBJECTIVE:  No distress. Improved   VITAL SIGNS: Temp:  [97.7 F (36.5 C)-98.4 F (36.9 C)] 97.7 F (36.5 C) (09/19 0858) Pulse Rate:  [67-90] 76 (09/19 0858) Resp:  [18-20] 20 (09/19 0858) BP: (86-120)/(45-72) 110/72 mmHg (09/19 0858) SpO2:  [87 %-98 %] 92 % (09/19 0858) FiO2 (%):  [50 %] 50 % (09/18 1523) [place]   6L sats 92% PHYSICAL EXAMINATION: General:  Obese, chronically ill appearing female, NAD  Neuro:  Awake, alert, appropriate, MAE  HEENT:  Mm moist, no JVD, no LA Cardiovascular:  s1s2 rrr Lungs:  resps even non labored on [place], diminished bases,no  wheeze w/out accessory muscle use  Abdomen:  Obese, round, soft,  NT Musculoskeletal:  Warm and dry, no sig edema, -homans    Recent Labs Lab 10/05/13 0215 10/08/13 1030 10/11/13 1004  NA 135* 135* 128*  K 4.8 4.6 6.3*  CL 99 95* 90*  CO2 22 27 23   BUN 16 30* 24*  CREATININE 1.07 1.14* 1.02  GLUCOSE 175* 165* 153*    Recent Labs Lab 10/09/13 0430 10/10/13 0701 10/11/13 0503  HGB 13.7 12.9 12.8  HCT 41.3 37.9 38.7  WBC 7.5 5.9 7.8  PLT 108* 104* 104*   Dg Chest Port 1 View  10/10/2013   CLINICAL DATA:  Short of breath  EXAM: PORTABLE CHEST - 1 VIEW  COMPARISON:  10/09/2013  FINDINGS: Mild left lower lobe atelectasis unchanged. Improvement in right lower lobe atelectasis.  Negative for heart failure or edema.  Negative for effusion.  IMPRESSION: Persistent left lower lobe atelectasis. Improvement in right lower lobe atelectasis. No other new findings.   Electronically Signed   By: Franchot Gallo M.D.   On: 10/10/2013 11:47   Dg Abd Portable 1v  10/10/2013   CLINICAL DATA:  Constipation, abdominal pain  EXAM: PORTABLE ABDOMEN - 1 VIEW  COMPARISON:  None.  FINDINGS: There is nonspecific nonobstructive bowel gas pattern. The study is limited by patient's large body habitus. Moderate stool in right colon.  IMPRESSION: Nonspecific nonobstructive bowel gas pattern. Moderate stool in right colon.   Electronically Signed   By: Lahoma Crocker M.D.   On: 10/10/2013 11:58    ASSESSMENT / PLAN:  Acute hypoxemic resp failure in setting of acute PE, strep  PNA (LLL) underlying COPD Bilateral LE DVT Pulmonary nodule - 50mm RUL, also 55mm RML Exertional hypoxia.     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  cont symbicort, spiriva with PRN albuterol - no obvious bronchospasm  Cont steroids - 1 dose of solumedrol given smoking cessation!!>>I discussed smoking cessation strategies  f/u 50mm nodule, new since CT  chest 11/14 -- ? if this is a malignancy given her smoking hx and now thromboembolic event - will plan f/u CT in 3 months as oupt  mammogram, colonoscopy as out-pt   Abd distension/ constipation - no BM x 1 week Ct laxative, enema>>get more aggressive ,see orders  PCCM will f/u again 9/21  Asencion Noble MD [facility]  [phone]  Cell  [phone]  If no response or cell goes to voicemail, call beeper [phone] 11:35 AM 10/11/2013

## 2013-10-11 NOTE — Progress Notes (Signed)
Soap suds enema given, pt had BM.

## 2013-10-11 NOTE — Progress Notes (Signed)
PROGRESS NOTE  [name] WJX:[id number] DOB: [birth year] DOA: 10/03/2013 PCP: No PCP Per Patient  Assessment/Plan: Pulmonary Embolism: She was on heparin Gtt. Transition to  Xarelto 9-17.   Hypercoagulable panel: Antithrombin III 97, Protein C 150, lupus anticoagulant not detected. Homocysteine 157, Factor V Leiden pending.  ECHO no right side heart strain.   Acute hypoxic Respiratory Failure:  -Patient become more hypoxic, increase oxygen requirement. Stat Chest x ray: Persistent left lower lobe atelectasis. Improvement in right lower lobe atelectasis. No other new findings. - CCM re-consulted. Patient BP decrease to 87. IV bolus in progres. Hypoxemia.  -Treating for PNA, COPD exacerbation. PE.  -CT angio positive for PE.   Thrombocytopenia; monitor, increase platelet consumption.   Community-acquired pneumonia: STREP PNA -CT abdomen, patient has right basilar infiltrate, indicating pneumonia.  - received :  ceftriaxone and azithromycin. Transition to Levaquin 9-17. Day 7 of antibiotics.  - blood culture x 2 NGTD - Fever control  -strep antigen positive   Hyperkalemia; repeat B-met due to hemolysis. Check EKG.   Hyponatremia; repeat B-met , urine osmolality. Will consider start IV fluids.   Constipation: KUB, non obstructive bowel pattern. Had BM after soap suds enema. Continue with miralax.   Hypotension; resolved with IV fluids. Holding spironolactone.   hypertension: -BP soft. Will hold spironolactone.   COPD: Continue home inhaler medications. Prednisone taper.   CKD-III: Baseline creatinine is about 1.2-1.7. His creatinine is 1.25 on admission, which is close to  baseline.  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 There is no obvious hepatic mass on CT abdomen. Currently seems to be compensated.  - monitor closely.  -holding spironolactone due to hypotension.   Patient does NOT have HIV as documented in the  H&P.  Taking tenovir for Hep B  Code Status: full Family Communication: patient Disposition Plan: remain inpatient.    Consultants:  none  Procedures:  none    HPI/Subjective: Patient become hypoxic when she was transition from bedside commode to bed. She denies worsening dyspnea, some weakness. Her Oxygen remain at 87 on 5 L, she was then transition to NB mask.  Patient alert answering question, denies worsening dyspnea.   Objective: Filed Vitals:   10/11/13 1313  BP: 113/55  Pulse: 72  Temp: 98 F (36.7 C)  Resp: 20    Intake/Output Summary (Last 24 hours) at 10/11/13 1600 Last data filed at 10/11/13 0855  Gross per 24 hour  Intake    240 ml  Output      0 ml  Net    240 ml   Filed Weights   10/04/13 0403 10/05/13 0326 10/08/13 0012  Weight: 92.7 kg (204 lb 5.9 oz) 92.1 kg (203 lb 0.7 oz) 92.7 kg (204 lb 5.9 oz)    Exam:   General:   in no distress, not using accessory muscle to breath.   Cardiovascular: rrr  Respiratory: bilateral air movement. Wheezing expiratory BL.   Abdomen: +Bs, soft, distended.   Musculoskeletal: no edema   Data Reviewed: Basic Metabolic Panel:  Recent Labs Lab 10/05/13 0215 10/08/13 1030 10/11/13 1004  NA 135* 135* 128*  K 4.8 4.6 6.3*  CL 99 95* 90*  CO2 22 27 23   GLUCOSE 175* 165* 153*  BUN 16 30* 24*  CREATININE 1.07 1.14* 1.02  CALCIUM 9.6 9.4 9.1   Liver Function Tests: No results found for this basename: AST, ALT, ALKPHOS, BILITOT, PROT, ALBUMIN,  in the last  168 hours No results found for this basename: LIPASE, AMYLASE,  in the last 168 hours No results found for this basename: AMMONIA,  in the last 168 hours CBC:  Recent Labs Lab 10/05/13 0215 10/08/13 2040 10/09/13 0430 10/10/13 0701 10/11/13 0503  WBC 6.9 7.5 7.5 5.9 7.8  HGB 13.4 13.2 13.7 12.9 12.8  HCT 39.2 38.3 41.3 37.9 38.7  MCV 91.6 91.6 91.8 90.0 90.4  PLT 105* 106* 108* 104* 104*   Cardiac Enzymes: No results found for this  basename: CKTOTAL, CKMB, CKMBINDEX, TROPONINI,  in the last 168 hours BNP (last 3 results)  Recent Labs  12/21/12 1630 07/14/13 2120  PROBNP 149.3* 40.7   CBG:  Recent Labs Lab 10/10/13 1148 10/10/13 1624 10/10/13 2133 10/11/13 0645 10/11/13 1154  GLUCAP 105* 150* 114* 87 145*    Recent Results (from the past 240 hour(s))  MRSA PCR SCREENING     Status: None   Collection Time    10/04/13  3:55 AM      Result Value Ref Range Status   MRSA by PCR NEGATIVE  NEGATIVE Final   Comment:            The GeneXpert MRSA Assay (FDA     approved for NASAL specimens     only), is one component of a     comprehensive MRSA colonization     surveillance program. It is not     intended to diagnose MRSA     infection nor to guide or     monitor treatment for     MRSA infections.  CULTURE, BLOOD (ROUTINE X 2)     Status: None   Collection Time    10/04/13  7:37 AM      Result Value Ref Range Status   Specimen Description BLOOD RIGHT ARM   Final   Special Requests BOTTLES DRAWN AEROBIC AND ANAEROBIC 10CC   Final   Culture  Setup Time     Final   Value: 10/04/2013 15:17     Performed at [facility]   Culture     Final   Value: NO GROWTH 5 DAYS     Note: Culture results may be compromised due to an excessive volume of blood received in culture bottles.     Performed at [facility]   Report Status 10/10/2013 FINAL   Final  CULTURE, BLOOD (ROUTINE X 2)     Status: None   Collection Time    10/04/13  7:46 AM      Result Value Ref Range Status   Specimen Description BLOOD RIGHT HAND   Final   Special Requests BOTTLES DRAWN AEROBIC AND ANAEROBIC 10CC   Final   Culture  Setup Time     Final   Value: 10/04/2013 15:18     Performed at [facility]   Culture     Final   Value: NO GROWTH 5 DAYS     Note: Culture results may be compromised due to an excessive volume of blood received in culture bottles.     Performed at [facility]   Report Status  10/10/2013 FINAL   Final     Studies: Dg Chest Port 1 View  10/10/2013   CLINICAL DATA:  Short of breath  EXAM: PORTABLE CHEST - 1 VIEW  COMPARISON:  10/09/2013  FINDINGS: Mild left lower lobe atelectasis unchanged. Improvement in right lower lobe atelectasis.  Negative for heart failure or edema.  Negative for effusion.  IMPRESSION: Persistent left lower lobe atelectasis. Improvement in right lower lobe atelectasis. No other new findings.   Electronically Signed   By: Franchot Gallo M.D.   On: 10/10/2013 11:47   Dg Abd Portable 1v  10/10/2013   CLINICAL DATA:  Constipation, abdominal pain  EXAM: PORTABLE ABDOMEN - 1 VIEW  COMPARISON:  None.  FINDINGS: There is nonspecific nonobstructive bowel gas pattern. The study is limited by patient's large body habitus. Moderate stool in right colon.  IMPRESSION: Nonspecific nonobstructive bowel gas pattern. Moderate stool in right colon.   Electronically Signed   By: Lahoma Crocker M.D.   On: 10/10/2013 11:58    Scheduled Meds: . albuterol  2.5 mg Nebulization Q6H  . amitriptyline  100 mg Oral QHS  . bisacodyl  10 mg Oral TID  . budesonide-formoterol  2 puff Inhalation BID  . entecavir  0.5 mg Oral QODAY  . Iloperidone  1 tablet Oral BID  . insulin aspart  0-15 Units Subcutaneous TID WC  . insulin aspart  0-5 Units Subcutaneous QHS  . levofloxacin  750 mg Oral QPM  . lidocaine  1 patch Transdermal Daily  . nicotine  7 mg Transdermal Daily  . pantoprazole  40 mg Oral Daily  . polyethylene glycol  17 g Oral BID  . predniSONE  40 mg Oral Q breakfast  . Rivaroxaban  15 mg Oral BID WC  . [START ON 10/31/2013] rivaroxaban  20 mg Oral Q supper  . senna  3 tablet Oral QHS  . sodium phosphate  1 enema Rectal Once  . tenofovir  300 mg Oral QODAY  . tiotropium  18 mcg Inhalation Daily  . traMADol  50 mg Oral BID   Continuous Infusions:  Antibiotics Given (last 72 hours)   Date/Time Action Medication Dose   10/08/13 1718 Given   levofloxacin (LEVAQUIN)  tablet 750 mg 750 mg   10/09/13 1012 Given   tenofovir (VIREAD) tablet 300 mg 300 mg   10/09/13 1750 Given   levofloxacin (LEVAQUIN) tablet 750 mg 750 mg   10/10/13 1325 Given   entecavir (BARACLUDE) tablet 0.5 mg 0.5 mg   10/10/13 1642 Given   levofloxacin (LEVAQUIN) tablet 750 mg 750 mg   10/11/13 1049 Given   tenofovir (VIREAD) tablet 300 mg 300 mg      Principal Problem:   Community acquired pneumonia Active Problems:   HYPERTENSION   CIRRHOSIS   CKD (chronic kidney disease), stage III   COPD (chronic obstructive pulmonary disease)   Kidney stone   Tobacco abuse   Acute pulmonary embolism    Time spent: 35 min    Omaira Mellen, [facility] Pager [phone] If 7PM-7AM, please contact night-coverage at [email or web address], password [facility] 10/11/2013, 4:00 PM  LOS: 8 days

## 2013-10-12 LAB — CBC
HCT: 39 % (ref 36.0–46.0)
Hemoglobin: 13.3 g/dL (ref 12.0–15.0)
MCH: 30.6 pg (ref 26.0–34.0)
MCHC: 34.1 g/dL (ref 30.0–36.0)
MCV: 89.9 fL (ref 78.0–100.0)
Platelets: 120 10*3/uL — ABNORMAL LOW (ref 150–400)
RBC: 4.34 MIL/uL (ref 3.87–5.11)
RDW: 12.6 % (ref 11.5–15.5)
WBC: 7.7 10*3/uL (ref 4.0–10.5)

## 2013-10-12 LAB — GLUCOSE, CAPILLARY
GLUCOSE-CAPILLARY: 89 mg/dL (ref 70–99)
GLUCOSE-CAPILLARY: 131 mg/dL — AB (ref 70–99)
Glucose-Capillary: 101 mg/dL — ABNORMAL HIGH (ref 70–99)
Glucose-Capillary: 97 mg/dL (ref 70–99)

## 2013-10-12 LAB — BASIC METABOLIC PANEL
Anion gap: 12 (ref 5–15)
BUN: 26 mg/dL — ABNORMAL HIGH (ref 6–23)
CHLORIDE: 94 meq/L — AB (ref 96–112)
CO2: 28 mEq/L (ref 19–32)
Calcium: 9.3 mg/dL (ref 8.4–10.5)
Creatinine, Ser: 1.24 mg/dL — ABNORMAL HIGH (ref 0.50–1.10)
GFR, EST AFRICAN AMERICAN: 55 mL/min — AB (ref >90)
GFR, EST NON AFRICAN AMERICAN: 48 mL/min — AB (ref >90)
Glucose, Bld: 87 mg/dL (ref 70–99)
POTASSIUM: 4.4 meq/L (ref 3.7–5.3)
Sodium: 134 mEq/L — ABNORMAL LOW (ref 137–147)

## 2013-10-12 LAB — OSMOLALITY, URINE: OSMOLALITY UR: 222 mosm/kg — AB (ref 390–1090)

## 2013-10-12 NOTE — Progress Notes (Signed)
PROGRESS NOTE  [name] QMG:[id number] DOB: May 24, 1955 DOA: 10/03/2013 PCP: No PCP Per Patient  Assessment/Plan: Pulmonary Embolism: She was on heparin Gtt. Transition to  Xarelto 9-17.   Hypercoagulable panel: Antithrombin III 97, Protein C 150, lupus anticoagulant not detected. Homocysteine 157, Factor V Leiden pending.  ECHO no right side heart strain.   Acute hypoxic Respiratory Failure:  -Patient become more hypoxic, increase oxygen requirement. Stat Chest x ray: Persistent left lower lobe atelectasis. Improvement in right lower lobe atelectasis. No other new findings. - CCM re-consulted on 9-18. Patient BP decrease to 87, worsening hypoxemia.  -Treating for PNA, COPD exacerbation. PE.  -CT angio positive for PE.  -Oxygent saturation remain 90 on 6 L.   Thrombocytopenia; monitor, increase platelet consumption.   Community-acquired pneumonia: STREP PNA -CT abdomen, patient has right basilar infiltrate, indicating pneumonia.  - received :  ceftriaxone and azithromycin. Transition to Levaquin 9-17. Day 7/7 of antibiotics.  - blood culture x 2 NGTD - Fever control  -strep antigen positive    Hyponatremia; stable.   Constipation: KUB, non obstructive bowel pattern. Had BM after soap suds enema. Continue with miralax.   Hypotension; resolved with IV fluids. Holding spironolactone.   hypertension: -BP soft. Will hold spironolactone.   COPD: Continue home inhaler medications. Prednisone taper.   CKD-III: Baseline creatinine is about 1.2-1.7. His creatinine is 1.25 on admission, which is close to  baseline.  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 There is no obvious hepatic mass on CT abdomen. Currently seems to be compensated.  - monitor closely.  -holding spironolactone due to hypotension.   Patient does NOT have HIV as documented in the H&P.  Taking tenovir for Hep B  Code Status: full Family Communication:  patient Disposition Plan: remain inpatient.    Consultants:  none  Procedures:  none    HPI/Subjective: Breathing better, feeling better.  Had a BM.   Objective: Filed Vitals:   10/12/13 1415  BP: 120/73  Pulse: 81  Temp: 97.5 F (36.4 C)  Resp: 20    Intake/Output Summary (Last 24 hours) at 10/12/13 1732 Last data filed at 10/12/13 1420  Gross per 24 hour  Intake    480 ml  Output      0 ml  Net    480 ml   Filed Weights   10/04/13 0403 10/05/13 0326 10/08/13 0012  Weight: 92.7 kg (204 lb 5.9 oz) 92.1 kg (203 lb 0.7 oz) 92.7 kg (204 lb 5.9 oz)    Exam:   General:   in no distress, not using accessory muscle to breath.   Cardiovascular: rrr  Respiratory: bilateral air movement. Wheezing expiratory BL.   Abdomen: +Bs, soft, distended.   Musculoskeletal: no edema   Data Reviewed: Basic Metabolic Panel:  Recent Labs Lab 10/08/13 1030 10/11/13 1004 10/11/13 1559 10/12/13 0415  NA 135* 128* 131* 134*  K 4.6 6.3* 4.9 4.4  CL 95* 90* 90* 94*  CO2 27 23 25 28   GLUCOSE 165* 153* 188* 87  BUN 30* 24* 23 26*  CREATININE 1.14* 1.02 1.32* 1.24*  CALCIUM 9.4 9.1 9.2 9.3   Liver Function Tests: No results found for this basename: AST, ALT, ALKPHOS, BILITOT, PROT, ALBUMIN,  in the last 168 hours No results found for this basename: LIPASE, AMYLASE,  in the last 168 hours No results found for this basename: AMMONIA,  in the last 168 hours CBC:  Recent Labs Lab  10/08/13 2040 10/09/13 0430 10/10/13 0701 10/11/13 0503 10/12/13 0415  WBC 7.5 7.5 5.9 7.8 7.7  HGB 13.2 13.7 12.9 12.8 13.3  HCT 38.3 41.3 37.9 38.7 39.0  MCV 91.6 91.8 90.0 90.4 89.9  PLT 106* 108* 104* 104* 120*   Cardiac Enzymes: No results found for this basename: CKTOTAL, CKMB, CKMBINDEX, TROPONINI,  in the last 168 hours BNP (last 3 results)  Recent Labs  12/21/12 1630 07/14/13 2120  PROBNP 149.3* 40.7   CBG:  Recent Labs Lab 10/11/13 1714 10/11/13 2110 10/12/13 0629  10/12/13 1156 10/12/13 1645  GLUCAP 222* 116* 89 101* 131*    Recent Results (from the past 240 hour(s))  MRSA PCR SCREENING     Status: None   Collection Time    10/04/13  3:55 AM      Result Value Ref Range Status   MRSA by PCR NEGATIVE  NEGATIVE Final   Comment:            The GeneXpert MRSA Assay (FDA     approved for NASAL specimens     only), is one component of a     comprehensive MRSA colonization     surveillance program. It is not     intended to diagnose MRSA     infection nor to guide or     monitor treatment for     MRSA infections.  CULTURE, BLOOD (ROUTINE X 2)     Status: None   Collection Time    10/04/13  7:37 AM      Result Value Ref Range Status   Specimen Description BLOOD RIGHT ARM   Final   Special Requests BOTTLES DRAWN AEROBIC AND ANAEROBIC 10CC   Final   Culture  Setup Time     Final   Value: 10/04/2013 15:17     Performed at [facility]   Culture     Final   Value: NO GROWTH 5 DAYS     Note: Culture results may be compromised due to an excessive volume of blood received in culture bottles.     Performed at [facility]   Report Status 10/10/2013 FINAL   Final  CULTURE, BLOOD (ROUTINE X 2)     Status: None   Collection Time    10/04/13  7:46 AM      Result Value Ref Range Status   Specimen Description BLOOD RIGHT HAND   Final   Special Requests BOTTLES DRAWN AEROBIC AND ANAEROBIC 10CC   Final   Culture  Setup Time     Final   Value: 10/04/2013 15:18     Performed at [facility]   Culture     Final   Value: NO GROWTH 5 DAYS     Note: Culture results may be compromised due to an excessive volume of blood received in culture bottles.     Performed at [facility]   Report Status 10/10/2013 FINAL   Final     Studies: No results found.  Scheduled Meds: . albuterol  2.5 mg Nebulization Q6H  . amitriptyline  100 mg Oral QHS  . bisacodyl  10 mg Oral TID  . budesonide-formoterol  2 puff Inhalation BID  .  entecavir  0.5 mg Oral QODAY  . Iloperidone  1 tablet Oral BID  . insulin aspart  0-15 Units Subcutaneous TID WC  . insulin aspart  0-5 Units Subcutaneous QHS  . levofloxacin  750 mg Oral QPM  . lidocaine  1  patch Transdermal Daily  . nicotine  7 mg Transdermal Daily  . pantoprazole  40 mg Oral Daily  . polyethylene glycol  17 g Oral BID  . predniSONE  40 mg Oral Q breakfast  . Rivaroxaban  15 mg Oral BID WC  . [START ON 10/31/2013] rivaroxaban  20 mg Oral Q supper  . senna  3 tablet Oral QHS  . sodium phosphate  1 enema Rectal Once  . tenofovir  300 mg Oral QODAY  . tiotropium  18 mcg Inhalation Daily  . traMADol  50 mg Oral BID   Continuous Infusions:  Antibiotics Given (last 72 hours)   Date/Time Action Medication Dose   10/09/13 1750 Given   levofloxacin (LEVAQUIN) tablet 750 mg 750 mg   10/10/13 1325 Given   entecavir (BARACLUDE) tablet 0.5 mg 0.5 mg   10/10/13 1642 Given   levofloxacin (LEVAQUIN) tablet 750 mg 750 mg   10/11/13 1049 Given   tenofovir (VIREAD) tablet 300 mg 300 mg   10/11/13 1808 Given   levofloxacin (LEVAQUIN) tablet 750 mg 750 mg   10/12/13 1203 Given   entecavir (BARACLUDE) tablet 0.5 mg 0.5 mg   10/12/13 1727 Given   levofloxacin (LEVAQUIN) tablet 750 mg 750 mg      Principal Problem:   Community acquired pneumonia Active Problems:   HYPERTENSION   CIRRHOSIS   CKD (chronic kidney disease), stage III   COPD (chronic obstructive pulmonary disease)   Kidney stone   Tobacco abuse   Acute pulmonary embolism    Time spent: 35 min    Britnay Magnussen, [place] [facility] Pager [phone] If 7PM-7AM, please contact night-coverage at [email or web address], password [facility] 10/12/2013, 5:32 PM  LOS: 9 days

## 2013-10-12 NOTE — Progress Notes (Signed)
Pharmacy --> Xarelto  Changed to Xarelto for PE / DVTs Previously on Coumadin -- patient states that it was very difficult to manager her INRs / get them therapeutic Hg stable, no bleeding noted   Plan: Xarelto 15 mg po BID x 3 weeks then 20 mg daily Patient counseled about bleeding risk with Xarelto (given her cirrhosis).  Excell Seltzer, PharmD

## 2013-10-13 DIAGNOSIS — B1710 Acute hepatitis C without hepatic coma: Secondary | ICD-10-CM

## 2013-10-13 LAB — BASIC METABOLIC PANEL
ANION GAP: 13 (ref 5–15)
BUN: 28 mg/dL — ABNORMAL HIGH (ref 6–23)
CALCIUM: 8.9 mg/dL (ref 8.4–10.5)
CO2: 25 mEq/L (ref 19–32)
Chloride: 92 mEq/L — ABNORMAL LOW (ref 96–112)
Creatinine, Ser: 1.28 mg/dL — ABNORMAL HIGH (ref 0.50–1.10)
GFR, EST AFRICAN AMERICAN: 53 mL/min — AB (ref >90)
GFR, EST NON AFRICAN AMERICAN: 46 mL/min — AB (ref >90)
GLUCOSE: 78 mg/dL (ref 70–99)
POTASSIUM: 3.8 meq/L (ref 3.7–5.3)
SODIUM: 130 meq/L — AB (ref 137–147)

## 2013-10-13 LAB — PROTHROMBIN GENE MUTATION

## 2013-10-13 LAB — GLUCOSE, CAPILLARY
Glucose-Capillary: 77 mg/dL (ref 70–99)
Glucose-Capillary: 111 mg/dL — ABNORMAL HIGH (ref 70–99)
Glucose-Capillary: 190 mg/dL — ABNORMAL HIGH (ref 70–99)
Glucose-Capillary: 109 mg/dL — ABNORMAL HIGH (ref 70–99)

## 2013-10-13 LAB — FACTOR 5 LEIDEN

## 2013-10-13 LAB — CBC
HCT: 38.6 % (ref 36.0–46.0)
Hemoglobin: 13.2 g/dL (ref 12.0–15.0)
MCH: 31.4 pg (ref 26.0–34.0)
MCHC: 34.2 g/dL (ref 30.0–36.0)
MCV: 91.9 fL (ref 78.0–100.0)
Platelets: 108 10*3/uL — ABNORMAL LOW (ref 150–400)
RBC: 4.2 MIL/uL (ref 3.87–5.11)
RDW: 12.8 % (ref 11.5–15.5)
WBC: 9.4 10*3/uL (ref 4.0–10.5)

## 2013-10-13 MED ORDER — MINERAL OIL RE ENEM
1.0000 | ENEMA | Freq: Once | RECTAL | Status: DC
  Filled 2013-10-13: qty 1

## 2013-10-13 MED ORDER — ALBUTEROL SULFATE (2.5 MG/3ML) 0.083% IN NEBU: 2.5000 mg | INHALATION_SOLUTION | RESPIRATORY_TRACT | Status: DC | PRN

## 2013-10-13 MED ORDER — PREDNISONE 20 MG PO TABS
20.0000 mg | ORAL_TABLET | Freq: Every day | ORAL | Status: DC
  Administered 2013-10-14: 20 mg via ORAL
  Filled 2013-10-13: qty 1

## 2013-10-13 NOTE — Clinical Social Work Psychosocial (Signed)
Clinical Social Work Department BRIEF PSYCHOSOCIAL ASSESSMENT 10/13/2013  Patient:  [name], [name]     Account Number:  [id number]     Admit date:  10/03/2013  Clinical Social Worker:  Marciano Sequin  Date/Time:  10/13/2013 02:29 PM  Referred by:  RN  Date Referred:  10/13/2013 Referred for  SNF Placement   Other Referral:   Interview type:  Patient Other interview type:    PSYCHOSOCIAL DATA Living Status:  FAMILY Admitted from facility:   Level of care:   Primary support name:  Jacy Brocker, [phone] Primary support relationship to patient:  CHILD, ADULT Degree of support available:   Strong Support System    CURRENT CONCERNS  Other Concerns:    SOCIAL WORK ASSESSMENT / PLAN CSW met pt at bedside. CSW introduce self and purpose of visit. Pt presented with a normal affect and calm mood. CSW and pt discussed the clinical team recommendations for rehab. CSW explained the SNF process to the pt. CSW and pt reviewed the SNF list. Pt reported wishing to talk to her daughter about which SNF would be best for her. CSW provided the pt with contact information for further questions. CSW will continue to follow this pt and assist with discharge as needed.   Assessment/plan status:  Information/Referral to [employer] Other assessment/ plan:   Information/referral to community resources:   SNF List    PATIENT'S/FAMILY'S RESPONSE TO PLAN OF CARE: Agreed       Greta Doom, MSW, [place]

## 2013-10-13 NOTE — Progress Notes (Signed)
PROGRESS NOTE  [name] ZTI:[id number] DOB: Feb 02, 1955 DOA: 10/03/2013 PCP: No PCP Per Patient  Assessment/Plan: Pulmonary Embolism: She was on heparin Gtt. Transition to  Xarelto 9-17.   Hypercoagulable panel: Antithrombin III 97, Protein C 150, lupus anticoagulant not detected. Homocysteine 157, Factor V Leiden pending.  ECHO no right side heart strain.   Acute hypoxic Respiratory Failure:  -Patient become more hypoxic, increase oxygen requirement. Stat Chest x ray: Persistent left lower lobe atelectasis. Improvement in right lower lobe atelectasis. No other new findings. - CCM re-consulted on 9-18. Patient BP decrease to 87, worsening hypoxemia.  -Treating for PNA, COPD exacerbation. PE.  -CT angio positive for PE.  -Oxygent saturation remain 90 on 6 L.   Thrombocytopenia; monitor, increase platelet consumption.   Community-acquired pneumonia: STREP PNA -CT abdomen, patient has right basilar infiltrate, indicating pneumonia.  - received :  ceftriaxone and azithromycin. Transition to Levaquin 9-17. Day 7/7 of antibiotics.  - blood culture x 2 NGTD - Fever control  -strep antigen positive    Hyponatremia; stable.   Constipation: KUB, non obstructive bowel pattern. Had BM after soap suds enema. Continue with miralax.   Hypotension; resolved with IV fluids. Holding spironolactone.   hypertension: -BP soft. Will hold spironolactone.   COPD: Continue home inhaler medications. Prednisone taper.   CKD-III: Baseline creatinine is about 1.2-1.7. His creatinine is 1.25 on admission, which is close to  baseline.  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 There is no obvious hepatic mass on CT abdomen. Currently seems to be compensated.  - monitor closely.  -holding spironolactone due to hypotension.   Patient does NOT have HIV as documented in the H&P.  Taking tenovir for Hep B  Code Status: full Family Communication:  patient Disposition Plan: remain inpatient.    Consultants:  none  Procedures:  none    HPI/Subjective: Breathing better, feeling better.  Agree with SNF.   Objective: Filed Vitals:   10/13/13 1403  BP: 92/80  Pulse: 75  Temp: 98.1 F (36.7 C)  Resp: 20    Intake/Output Summary (Last 24 hours) at 10/13/13 1613 Last data filed at 10/13/13 0856  Gross per 24 hour  Intake   1200 ml  Output      0 ml  Net   1200 ml   Filed Weights   10/04/13 0403 10/05/13 0326 10/08/13 0012  Weight: 92.7 kg (204 lb 5.9 oz) 92.1 kg (203 lb 0.7 oz) 92.7 kg (204 lb 5.9 oz)    Exam:   General:   in no distress, not using accessory muscle to breath.   Cardiovascular: rrr  Respiratory: bilateral air movement. Wheezing expiratory BL.   Abdomen: +Bs, soft, distended.   Musculoskeletal: no edema   Data Reviewed: Basic Metabolic Panel:  Recent Labs Lab 10/08/13 1030 10/11/13 1004 10/11/13 1559 10/12/13 0415 10/13/13 0545  NA 135* 128* 131* 134* 130*  K 4.6 6.3* 4.9 4.4 3.8  CL 95* 90* 90* 94* 92*  CO2 27 23 25 28 25   GLUCOSE 165* 153* 188* 87 78  BUN 30* 24* 23 26* 28*  CREATININE 1.14* 1.02 1.32* 1.24* 1.28*  CALCIUM 9.4 9.1 9.2 9.3 8.9   Liver Function Tests: No results found for this basename: AST, ALT, ALKPHOS, BILITOT, PROT, ALBUMIN,  in the last 168 hours No results found for this basename: LIPASE, AMYLASE,  in the last 168 hours No results found for this basename: AMMONIA,  in the  last 168 hours CBC:  Recent Labs Lab 10/09/13 0430 10/10/13 0701 10/11/13 0503 10/12/13 0415 10/13/13 0545  WBC 7.5 5.9 7.8 7.7 9.4  HGB 13.7 12.9 12.8 13.3 13.2  HCT 41.3 37.9 38.7 39.0 38.6  MCV 91.8 90.0 90.4 89.9 91.9  PLT 108* 104* 104* 120* 108*   Cardiac Enzymes: No results found for this basename: CKTOTAL, CKMB, CKMBINDEX, TROPONINI,  in the last 168 hours BNP (last 3 results)  Recent Labs  12/21/12 1630 07/14/13 2120  PROBNP 149.3* 40.7   CBG:  Recent  Labs Lab 10/12/13 1156 10/12/13 1645 10/12/13 2125 10/13/13 0635 10/13/13 1124  GLUCAP 101* 131* 97 77 111*    Recent Results (from the past 240 hour(s))  MRSA PCR SCREENING     Status: None   Collection Time    10/04/13  3:55 AM      Result Value Ref Range Status   MRSA by PCR NEGATIVE  NEGATIVE Final   Comment:            The GeneXpert MRSA Assay (FDA     approved for NASAL specimens     only), is one component of a     comprehensive MRSA colonization     surveillance program. It is not     intended to diagnose MRSA     infection nor to guide or     monitor treatment for     MRSA infections.  CULTURE, BLOOD (ROUTINE X 2)     Status: None   Collection Time    10/04/13  7:37 AM      Result Value Ref Range Status   Specimen Description BLOOD RIGHT ARM   Final   Special Requests BOTTLES DRAWN AEROBIC AND ANAEROBIC 10CC   Final   Culture  Setup Time     Final   Value: 10/04/2013 15:17     Performed at [facility]   Culture     Final   Value: NO GROWTH 5 DAYS     Note: Culture results may be compromised due to an excessive volume of blood received in culture bottles.     Performed at [facility]   Report Status 10/10/2013 FINAL   Final  CULTURE, BLOOD (ROUTINE X 2)     Status: None   Collection Time    10/04/13  7:46 AM      Result Value Ref Range Status   Specimen Description BLOOD RIGHT HAND   Final   Special Requests BOTTLES DRAWN AEROBIC AND ANAEROBIC 10CC   Final   Culture  Setup Time     Final   Value: 10/04/2013 15:18     Performed at [facility]   Culture     Final   Value: NO GROWTH 5 DAYS     Note: Culture results may be compromised due to an excessive volume of blood received in culture bottles.     Performed at [facility]   Report Status 10/10/2013 FINAL   Final     Studies: No results found.  Scheduled Meds: . amitriptyline  100 mg Oral QHS  . bisacodyl  10 mg Oral TID  . budesonide-formoterol  2 puff  Inhalation BID  . entecavir  0.5 mg Oral QODAY  . Iloperidone  1 tablet Oral BID  . insulin aspart  0-15 Units Subcutaneous TID WC  . insulin aspart  0-5 Units Subcutaneous QHS  . lidocaine  1 patch Transdermal Daily  . mineral oil  1 enema Rectal Once  . nicotine  7 mg Transdermal Daily  . pantoprazole  40 mg Oral Daily  . polyethylene glycol  17 g Oral BID  . [START ON 10/14/2013] predniSONE  20 mg Oral Q breakfast  . Rivaroxaban  15 mg Oral BID WC  . [START ON 10/31/2013] rivaroxaban  20 mg Oral Q supper  . senna  3 tablet Oral QHS  . sodium phosphate  1 enema Rectal Once  . tenofovir  300 mg Oral QODAY  . tiotropium  18 mcg Inhalation Daily  . traMADol  50 mg Oral BID   Continuous Infusions:  Antibiotics Given (last 72 hours)   Date/Time Action Medication Dose   10/10/13 1642 Given   levofloxacin (LEVAQUIN) tablet 750 mg 750 mg   10/11/13 1049 Given   tenofovir (VIREAD) tablet 300 mg 300 mg   10/11/13 1808 Given   levofloxacin (LEVAQUIN) tablet 750 mg 750 mg   10/12/13 1203 Given   entecavir (BARACLUDE) tablet 0.5 mg 0.5 mg   10/12/13 1727 Given   levofloxacin (LEVAQUIN) tablet 750 mg 750 mg   10/13/13 1024 Given   tenofovir (VIREAD) tablet 300 mg 300 mg      Principal Problem:   Community acquired pneumonia Active Problems:   HYPERTENSION   CIRRHOSIS   CKD (chronic kidney disease), stage III   COPD (chronic obstructive pulmonary disease)   Kidney stone   Tobacco abuse   Acute pulmonary embolism    Time spent: 35 min    Darlys Buis, [place] [facility] Pager [phone] If 7PM-7AM, please contact night-coverage at [email or web address], password [facility] 10/13/2013, 4:13 PM  LOS: 10 days

## 2013-10-13 NOTE — Progress Notes (Signed)
   Name: [name] MRN: [id number] DOB: 02/27/1955    ADMISSION DATE:  10/03/2013 CONSULTATION DATE:  10/08/13  REFERRING MD :  Tyrell Antonio   CHIEF COMPLAINT:  Respiratory failure   BRIEF PATIENT DESCRIPTION:  58 yo female smoker presented with Lt sided chest pain from PNA and AECOPD.  Developed progressive hypoxia, and found to have PE >> she has prior hx of PE.  PCCM consulted to assist with respiratory management.  She has hx of CKD, Hep B/C with cirrhosis.  SIGNIFICANT EVENTS: 9/11 Admit 9/16 Progressive hypoxia >> PCCM consulted, start heparin gtt 9/17 Start xarelto  STUDIES:  9/16 Doppler legs >> DVT right popliteal, left femoral, left popliteal, left posterior tibial, and left peroneal veins.  9/16 Echo >> EF 65 to 70%, mild LVH, grade 1 diastolic dysfx 7/74 CT chest >> PE Rt main PA to RUL and RLL, small PE LLL, 4 cm ascending thoracic aorta, LLL consolidation, RML patchy ASD, 5 mm nodule RUL, 3 mm nodule RML, liver changes of cirrhosis  SUBJECTIVE:  Still has sticky sputum, but less.  Flutter valve helps.  VITAL SIGNS: Temp:  [97.5 F (36.4 C)-97.9 F (36.6 C)] 97.9 F (36.6 C) (09/21 0944) Pulse Rate:  [63-81] 75 (09/21 0956) Resp:  [18-24] 24 (09/21 0956) BP: (98-127)/(68-76) 98/68 mmHg (09/21 0944) SpO2:  [88 %-97 %] 97 % (09/21 0956)  PHYSICAL EXAMINATION: General: no distress Neuro: normal strength  HEENT: no sinus tenderness Cardiovascular: regular Lungs: scattered rhonchi, no wheeze Abdomen: soft, non tender Musculoskeletal: no edema   Recent Labs Lab 10/11/13 1559 10/12/13 0415 10/13/13 0545  NA 131* 134* 130*  K 4.9 4.4 3.8  CL 90* 94* 92*  CO2 25 28 25   BUN 23 26* 28*  CREATININE 1.32* 1.24* 1.28*  GLUCOSE 188* 87 78    Recent Labs Lab 10/11/13 0503 10/12/13 0415 10/13/13 0545  HGB 12.8 13.3 13.2  HCT 38.7 39.0 38.6  WBC 7.8 7.7 9.4  PLT 104* 120* 108*   No results found.  ASSESSMENT / PLAN:  Acute hypoxic respiratory failure  2nd to PNA and PE. Plan: Adjust oxygen to keep SpO2 > 92%  Pneumococcal PNA (Urine antigen positive 9/13). Plan: Abx per primary team F/u CXR intermittently  Acute PE, DVT >> she has prior hx of PE. Plan: Continue xarelto per primary team >> she will likely need life long anti-coagulation  Tobacco abuse with pulmonary nodules. Plan: She will need outpt f/u CT chest w/o contrast in December 2015 Needs to stop smoking  Hx of COPD. Plan: Continue spiriva, symbicort, and prn albuterol Will need PFT's as outpt when more stable Wean off prednisone as tolerated over next few days  Hx of Hep B/C with cirrhosis. Plan: Per primary team  Hypotension 2nd to hypovolemia >> resolved.  She is scheduled for hospital follow up visit for October 8, Thursday at 2:15 pm with Dr. Chase Caller.  PCCM will sign off.  Please call if additional help needed while pt is in hospital.   Chesley Mires, MD [facility] Pulmonary/Critical Care 10/13/2013, 11:34 AM Pager:  [phone] After 3pm call: [phone]

## 2013-10-13 NOTE — Clinical Social Work Placement (Addendum)
Clinical Social Work Department CLINICAL SOCIAL WORK PLACEMENT NOTE 10/13/2013  Patient:  [name], [name]  Account Number:  [id number] Admit date:  10/03/2013  Clinical Social Worker:  Greta Doom, LCSWA  Date/time:  10/13/2013 02:34 PM  Clinical Social Work is seeking post-discharge placement for this patient at the following level of care:   SKILLED NURSING   (*CSW will update this form in Epic as items are completed)   10/13/2013  Patient/family provided with [facility] Department of Clinical Social Work's list of facilities offering this level of care within the geographic area requested by the patient (or if unable, by the patient's family).  10/13/2013  Patient/family informed of their freedom to choose among providers that offer the needed level of care, that participate in Medicare, Medicaid or managed care program needed by the patient, have an available bed and are willing to accept the patient.  10/13/2013  Patient/family informed of [facility]' ownership interest in [facility], as well as of the fact that they are under no obligation to receive care at this facility.  PASARR submitted to EDS on 10/13/2013 PASARR number received on 10/13/2013  FL2 transmitted to all facilities in geographic area requested by pt/family on  10/13/2013 FL2 transmitted to all facilities within larger geographic area on 10/13/2013  Patient informed that his/her managed care company has contracts with or will negotiate with  certain facilities, including the following:     Patient/family informed of bed offers received:  10/14/2013 Patient chooses bed at [facility]  Physician recommends and patient chooses bed at    Patient to be transferred to [facility] on  10/14/2013 Patient to be transferred to facility by [facility] Patient and family notified of transfer on 10/14/2013  Name of family member notified:  Arely Tinner pt's daughter was present by the bedside.   The  following physician request were entered in Epic:   Additional Comments:  [place], MSW, [place]

## 2013-10-14 LAB — GLUCOSE, CAPILLARY
Glucose-Capillary: 88 mg/dL (ref 70–99)
Glucose-Capillary: 110 mg/dL — ABNORMAL HIGH (ref 70–99)

## 2013-10-14 MED ORDER — RIVAROXABAN 20 MG PO TABS: 20.0000 mg | ORAL_TABLET | Freq: Every day | ORAL | Status: DC

## 2013-10-14 MED ORDER — LIDOCAINE 5 % EX PTCH
1.0000 | MEDICATED_PATCH | Freq: Every day | CUTANEOUS | Status: DC
Start: 2013-10-14 — End: 2013-10-26

## 2013-10-14 MED ORDER — NICOTINE 7 MG/24HR TD PT24: 7.0000 mg | MEDICATED_PATCH | Freq: Every day | TRANSDERMAL | Status: DC

## 2013-10-14 MED ORDER — SENNA 8.6 MG PO TABS: 3.0000 | ORAL_TABLET | Freq: Every day | ORAL | Status: DC

## 2013-10-14 MED ORDER — PREDNISONE 20 MG PO TABS: ORAL_TABLET | ORAL | Status: DC

## 2013-10-14 MED ORDER — RIVAROXABAN 15 MG PO TABS: 15.0000 mg | ORAL_TABLET | Freq: Two times a day (BID) | ORAL | Status: DC

## 2013-10-14 MED ORDER — POLYETHYLENE GLYCOL 3350 17 G PO PACK: 17.0000 g | PACK | Freq: Two times a day (BID) | ORAL | Status: DC

## 2013-10-14 NOTE — Plan of Care (Signed)
Problem: Phase II Progression Outcomes Goal: Wean O2 if indicated Outcome: Not Met (add Reason) Pt requires O2 [place]  Problem: Phase III Progression Outcomes Goal: O2 sats > or equal to 93% on room air Outcome: Not Met (add Reason) Pt requires o2 [place]     

## 2013-10-14 NOTE — Plan of Care (Signed)
Problem: Phase III Progression Outcomes Goal: O2 sats > or equal to 93% on room air Outcome: Not Applicable Date Met:  19/80/22 Pt requires o2 [place]

## 2013-10-14 NOTE — Evaluation (Signed)
Physical Therapy Evaluation Patient Details Name: [name] MRN: [id number] DOB: Apr 19, 1955 Today's Date: 10/14/2013   History of Present Illness  Patient admitted with CAP and PE, developed ARF.  H/O COPD.  Clinical Impression  Patient did well with mobility.  Patient using Rolling walker for balance and did not assistive device prior to admission.  Feel patient could benefit from continued PT to progress ambulation back to independent without device.  Patient is limited by cardiopulmonary status.  Due to limited support at home, recommend short-term SNF with ultimate return home.      Follow Up Recommendations SNF    Equipment Recommendations  Rolling walker with 5" wheels    Recommendations for Other Services       Precautions / Restrictions Precautions Precautions: Fall Precaution Comments: chair alarm      Mobility  Bed Mobility               General bed mobility comments: patient in chair upon arrival  Transfers Overall transfer level: Needs assistance Equipment used: Rolling walker (2 wheeled) Transfers: Sit to/from Stand Sit to Stand: Min guard            Ambulation/Gait Ambulation/Gait assistance: Supervision Ambulation Distance (Feet): 150 Feet Assistive device: Rolling walker (2 wheeled) Gait Pattern/deviations: Step-through pattern Gait velocity: decreased   General Gait Details: with slight SOB during gait  Stairs            Wheelchair Mobility    Modified Rankin (Stroke Patients Only)       Balance Overall balance assessment: Needs assistance Sitting-balance support: No upper extremity supported Sitting balance-Leahy Scale: Good     Standing balance support: Bilateral upper extremity supported Standing balance-Leahy Scale: Poor                               Pertinent Vitals/Pain Pain Assessment: No/denies pain    Home Living Family/patient expects to be discharged to:: Skilled nursing facility                  Additional Comments: intends to go to SNF until more independent and then return home with daughter    Prior Function Level of Independence: Independent               Hand Dominance        Extremity/Trunk Assessment   Upper Extremity Assessment: Overall WFL for tasks assessed           Lower Extremity Assessment: Overall WFL for tasks assessed      Cervical / Trunk Assessment: Normal  Communication   Communication: No difficulties  Cognition Arousal/Alertness: Awake/alert Behavior During Therapy: WFL for tasks assessed/performed Overall Cognitive Status: Within Functional Limits for tasks assessed                      General Comments      Exercises        Assessment/Plan    PT Assessment Patient needs continued PT services  PT Diagnosis Generalized weakness   PT Problem List Decreased activity tolerance;Decreased balance;Decreased mobility;Cardiopulmonary status limiting activity  PT Treatment Interventions DME instruction;Gait training;Functional mobility training;Therapeutic activities;Balance training;Patient/family education   PT Goals (Current goals can be found in the Care Plan section) Acute Rehab PT Goals Patient Stated Goal: get back home PT Goal Formulation: With patient/family Time For Goal Achievement: 10/21/13 Potential to Achieve Goals: Good    Frequency Min 3X/week  Barriers to discharge        Co-evaluation               End of Session Equipment Utilized During Treatment: Oxygen Activity Tolerance: Patient tolerated treatment well Patient left: in chair;with call bell/phone within reach;with chair alarm set;with family/visitor present           Time: 0935-1001 PT Time Calculation (min): 26 min   Charges:   PT Evaluation $Initial PT Evaluation Tier I: 1 Procedure PT Treatments $Gait Training: 8-22 mins   PT G CodesShanna Cisco, [place] 10/14/2013, 10:06 AM

## 2013-10-14 NOTE — Plan of Care (Signed)
Problem: Phase II Progression Outcomes Goal: Wean O2 if indicated Outcome: Not Applicable Date Met:  24/46/95 Pt requires o2

## 2013-10-14 NOTE — Progress Notes (Signed)
1135 - report given to Linus Orn, Therapist, sports at [facility]. Pt to be transported per ambulance to facility at approximately 1400, per social worker. Will monitor   Angeline Slim I 10/14/2013 11:45 AM

## 2013-10-14 NOTE — Discharge Summary (Signed)
Physician Discharge Summary  [name] SNK:[id number] DOB: 02-21-1955 DOA: 10/03/2013  PCP: No PCP Per Patient  Admit date: 10/03/2013 Discharge date: 10/14/2013  Time spent: 35 minutes  Recommendations for Outpatient Follow-up:  1. Need B-met to follow renal function and sodium level.  2. Consider resuming spironolactone if BP allows it.   Discharge Diagnoses:    Community acquired pneumonia   Acute pulmonary embolism   HYPERTENSION   CIRRHOSIS   CKD (chronic kidney disease), stage III   COPD (chronic obstructive pulmonary disease)   Kidney stone   Tobacco abuse   Discharge Condition: Stable.   Diet recommendation: Heart Healthy  Filed Weights   10/04/13 0403 10/05/13 0326 10/08/13 0012  Weight: 92.7 kg (204 lb 5.9 oz) 92.1 kg (203 lb 0.7 oz) 92.7 kg (204 lb 5.9 oz)    History of present illness:  [name] is a 58 y.o. female with past medical history of COPD, cirrhosis, pulmonary embolism, HIV, hypertension, hepatitis B, who presents with right sided back pain and left sided chest pain.  Patient reported that her back pain suddenly started at about 4 PM when she was watching TV. His back pain is located at the right lower back, constant, 10 out of 10 in severity, stabing-like pain, nonradiating. It is not aggravated or alleviated with any known factors. Patient denies any fevers or urinary symptoms. She denies ever having these symptoms in the past. She's had no nausea, vomiting or diarrhea. She denies any history of kidney stones in the past.  Patient also reports having left-sided chest pain. It has been going on for several days. The pain is constant, sharp, nonradiating. It is associated with dry cough. It is pleruritic. It is aggravated by deep breath. She does not have fever or chills. No recent cold-like symptoms, such as runny nose, sore throat. she reports that she has baseline shortness of breath because of COPD, which has not been worsening. He has a long history  of smoking, still smokes one pack a day. She was found to be desaturated to 88% in ED, which quickly comes back to above 90% on 2 L of oxygen. Of note, patient has history of pulmonary embolism. She completed 1-year Coumadin treatment 6 months ago. Currently she does not have any pain over calf areas. No recent history of long distance traveling.  Because of concerning for kidney stone, CT abdomen was done in Ed, which showed bilateral kidney stones without obstruction and right basilar infiltration.    Hospital Course:  Pulmonary Embolism:  She was on heparin Gtt.  Transition to Xarelto 9-17.  Hypercoagulable panel: Antithrombin III 97, Protein C 150, lupus anticoagulant not detected. Homocysteine 157, Factor V Leiden pending.  ECHO no right side heart strain.   Acute hypoxic Respiratory Failure:  -Patient become more hypoxic, increase oxygen requirement. Stat Chest x ray: Persistent left lower lobe atelectasis. Improvement in right lower lobe atelectasis. No other new findings.  - CCM re-consulted on 9-18. Patient BP decrease to 87, worsening hypoxemia.  -Treating for PNA, COPD exacerbation. PE.  -CT angio positive for PE.  -Oxygent saturation remain 92 on 4 L.   Thrombocytopenia; monitor, increase platelet consumption.   Community-acquired pneumonia: STREP PNA  -CT abdomen, patient has right basilar infiltrate, indicating pneumonia.  - received : ceftriaxone and azithromycin. Transition to Levaquin 9-17. Day 7/7 of antibiotics.  - blood culture x 2 NGTD  - Fever control  -strep antigen positive   Hyponatremia; stable.   Constipation: KUB,  non obstructive bowel pattern. Had BM after soap suds enema. Continue with miralax.   Hypotension; resolved with IV fluids. Consider resuming spironolactone if BP allows it.   hypertension:  -Holding spironolactone due to soft BP.   COPD: Continue home inhaler medications. Prednisone taper.  CKD-III: Baseline creatinine is about 1.2-1.7. His  creatinine is 1.25 on admission, which is close to baseline.  -Monitor renal function by BMP  Cirrhosis: Likely secondary to hepatitis B and hepatitis C. There is no obvious hepatic mass on CT abdomen. Currently seems to be compensated.  - monitor closely.  -holding spironolactone due to hypotension. Might need to resume if BP allows it.  Patient does NOT have HIV as documented in the H&P. Taking tenovir for Hep B   Procedures: ECHO;   Consultations:  Pulmonary.   Discharge Exam: Filed Vitals:   10/14/13 1046  BP: 111/83  Pulse: 70  Temp: 97.9 F (36.6 C)  Resp: 20    General: Alert in no distress.  Cardiovascular: S 1, S 2 RRR Respiratory: sporadic ronchus, wheezing.   Discharge Instructions You were cared for by a hospitalist during your hospital stay. If you have any questions about your discharge medications or the care you received while you were in the hospital after you are discharged, you can call the unit and asked to speak with the hospitalist on call if the hospitalist that took care of you is not available. Once you are discharged, your primary care physician will handle any further medical issues. Please note that NO REFILLS for any discharge medications will be authorized once you are discharged, as it is imperative that you return to your primary care physician (or establish a relationship with a primary care physician if you do not have one) for your aftercare needs so that they can reassess your need for medications and monitor your lab values.  Discharge Instructions   Diet - low sodium heart healthy    Complete by:  As directed      Increase activity slowly    Complete by:  As directed           Current Discharge Medication List    START taking these medications   Details  lidocaine (LIDODERM) 5 % Place 1 patch onto the skin daily. Remove & Discard patch within 12 hours or as directed by MD Qty: 30 patch, Refills: 0    nicotine (NICODERM CQ - DOSED IN  MG/24 HR) 7 mg/24hr patch Place 1 patch (7 mg total) onto the skin daily. Qty: 28 patch, Refills: 0    polyethylene glycol (MIRALAX / GLYCOLAX) packet Take 17 g by mouth 2 (two) times daily. Qty: 14 each, Refills: 0    predniSONE (DELTASONE) 20 MG tablet Take 1 tablet for 2 days then half tablet for one day then stop. Qty: 3 tablet, Refills: 0    !! Rivaroxaban (XARELTO) 15 MG TABS tablet Take 1 tablet (15 mg total) by mouth 2 (two) times daily with a meal. Qty: 30 tablet, Refills: 0    !! rivaroxaban (XARELTO) 20 MG TABS tablet Take 1 tablet (20 mg total) by mouth daily with supper. Qty: 30 tablet, Refills: 0    senna (SENOKOT) 8.6 MG TABS tablet Take 3 tablets (25.8 mg total) by mouth at bedtime. Qty: 120 each, Refills: 0     !! - Potential duplicate medications found. Please discuss with provider.    CONTINUE these medications which have NOT CHANGED   Details  albuterol (PROVENTIL HFA;VENTOLIN  HFA) 108 (90 BASE) MCG/ACT inhaler Inhale 2 puffs into the lungs every 6 (six) hours as needed. For shortness of breath     ALPRAZolam (XANAX) 1 MG tablet Take 1 mg by mouth 3 (three) times daily as needed. For anxiety    amitriptyline (ELAVIL) 50 MG tablet Take 100 mg by mouth at bedtime.     budesonide-formoterol (SYMBICORT) 160-4.5 MCG/ACT inhaler Inhale 2 puffs into the lungs 2 (two) times daily.      entecavir (BARACLUDE) 0.5 MG tablet Take 0.5 mg by mouth every other day.     Iloperidone (FANAPT) 6 MG TABS Take 2 tablets by mouth daily.     omeprazole (PRILOSEC) 40 MG capsule Take 80 mg by mouth daily.     tenofovir (VIREAD) 300 MG tablet Take 300 mg by mouth every other day.    tiotropium (SPIRIVA) 18 MCG inhalation capsule Place 18 mcg into inhaler and inhale daily.      traMADol (ULTRAM) 50 MG tablet Take 50 mg by mouth 2 (two) times daily.       STOP taking these medications     meloxicam (MOBIC) 15 MG tablet      spironolactone (ALDACTONE) 25 MG tablet         Allergies  Allergen Reactions  . Duloxetine Other (See Comments)    REACTION: Headache  . Gabapentin     REACTION: rash in mouth  . Neomycin-Bacitracin Zn-Polymyx     REACTION: rash/hives   Follow-up Information   Follow up with [facility], [place] On 10/30/2013. (2:15pm )    Specialty:  Pulmonary Disease   Contact information:   [place] [address] [phone]        The results of significant diagnostics from this hospitalization (including imaging, microbiology, ancillary and laboratory) are listed below for reference.    Significant Diagnostic Studies: Dg Chest 2 View  10/04/2013   CLINICAL DATA:  Right flank pain.  EXAM: CHEST  2 VIEW  COMPARISON:  Chest radiograph performed 07/14/2013, and CT of the abdomen and pelvis performed earlier today at 1:40 a.m.  FINDINGS: The lungs are hypoexpanded. Minimal bibasilar opacities are better characterized on recent CT of the abdomen and pelvis, and raise concern for mild pneumonia. There is no evidence of pleural effusion or pneumothorax.  The heart is normal in size; the mediastinal contour is within normal limits. No acute osseous abnormalities are seen.  IMPRESSION: Lungs hypoexpanded. Minimal bibasilar opacities, better characterized on recent CT of the abdomen and pelvis, raise concern for mild pneumonia.   Electronically Signed   By: Garald Balding M.D.   On: 10/04/2013 03:53   Ct Angio Chest Pe W/cm &/or Wo Cm  10/08/2013   CLINICAL DATA:  Hypoxia  EXAM: CT ANGIOGRAPHY CHEST WITH CONTRAST  TECHNIQUE: Multidetector CT imaging of the chest was performed using the standard protocol during bolus administration of intravenous contrast. Multiplanar CT image reconstructions and MIPs were obtained to evaluate the vascular anatomy.  CONTRAST:  30m OMNIPAQUE IOHEXOL 350 MG/ML SOLN  COMPARISON:  Chest CT October 21, 2012 and chest radiograph October 04, 2013  FINDINGS: There is pulmonary embolus arising from the distal  aspect of the right main pulmonary artery extending into multiple right upper and lower lobe branches. There is a small pulmonary embolus in a peripheral branch of the left lower lobe pulmonary artery. The right ventricle to left ventricular ratio is 0.85, slightly less than the threshold for right heart strain.  There is no  appreciable thoracic aortic dissection. The ascending thoracic aorta is borderline prominent at 4.0 x 3.8 cm. There is no frank aneurysm.  There is consolidation with volume loss in the left lower lobe. There is patchy infiltrate in the medial segment of the right middle lobe inferiorly as well as in the anterior segment of the right upper lobe. More subtle patchy infiltrate is noted in the superior segment of the right lower lobe.  On axial slice 45 series 811, there is a 5 mm nodular opacity in the anterior segment of the right upper lobe. On axial slice 52 series 572, there is a 3 mm nodular opacity in the lateral segment of the right middle lobe.  There is no appreciable thoracic adenopathy. The pericardium is not thickened.  In the visualized upper abdomen, the contour of the liver suggests underlying cirrhosis.  There are no blastic or lytic bone lesions. Visualized thyroid appears normal.  Review of the MIP images confirms the above findings.  IMPRESSION: Extensive pulmonary embolus on the right. Small peripheral pulmonary embolus on the left. The patient has right ventricle to left ventricle diameter ratio does not meet criteria for frank right heart strain.  Multiple areas of infiltrate with extensive consolidation in the left lower lobe. Nodular opacities, largest measuring 5 mm as noted above. Followup of these nodular opacity should be based on Fleischner Society guidelines. If the patient is at high risk for bronchogenic carcinoma, follow-up chest CT at 6-12 months is recommended. If the patient is at low risk for bronchogenic carcinoma, follow-up chest CT at 12 months is  recommended. This recommendation follows the consensus statement: Guidelines for Management of Small Pulmonary Nodules Detected on CT Scans: A Statement from the [employer] as published in Radiology 2005;237:395-400.  The appearance of the liver suggests underlying cirrhosis.  Critical Value/emergent results were called by telephone at the time of interpretation on 10/08/2013 at 12:11 pm to Dr. Niel Hummer , who verbally acknowledged these results.   Electronically Signed   By: Lowella Grip M.D.   On: 10/08/2013 12:12   Dg Chest Port 1 View  10/10/2013   CLINICAL DATA:  Short of breath  EXAM: PORTABLE CHEST - 1 VIEW  COMPARISON:  10/09/2013  FINDINGS: Mild left lower lobe atelectasis unchanged. Improvement in right lower lobe atelectasis.  Negative for heart failure or edema.  Negative for effusion.  IMPRESSION: Persistent left lower lobe atelectasis. Improvement in right lower lobe atelectasis. No other new findings.   Electronically Signed   By: Franchot Gallo M.D.   On: 10/10/2013 11:47   Dg Chest Port 1 View  10/09/2013   CLINICAL DATA:  Cough congestion and shortness of breath. And respiratory failure. History of pulmonary embolism, pneumonia, COPD and tobacco use  EXAM: PORTABLE CHEST - 1 VIEW  COMPARISON:  PA and lateral chest x-rays of October 04, 2013  FINDINGS: The lungs are adequately inflated. There is subtle increased density that projects over the left lower hemithorax and just above the right hemidiaphragm. The heart and pulmonary vascularity are normal. There is mild tortuosity of the ascending thoracic aorta. There is no pneumothorax or significant pleural effusion. The observed bony thorax is unremarkable.  IMPRESSION: Hazy airspace disease at the lung bases especially on the left is consistent with atelectasis. There is no alveolar infiltrate nor evidence of CHF.   Electronically Signed   By: David  Martinique   On: 10/09/2013 08:07   Dg Chest Port 1 View  10/04/2013    CLINICAL  DATA:  Hypoxia.  EXAM: PORTABLE CHEST - 1 VIEW  COMPARISON:  Chest x-ray 07/14/2013.  FINDINGS: Lung volumes are low. Bibasilar subsegmental atelectasis. No consolidative airspace disease. No pleural effusions. No pneumothorax. No pulmonary nodule or mass noted. Pulmonary vasculature and the cardiomediastinal silhouette are within normal limits.  IMPRESSION: 1. Low lung volumes with probable bibasilar subsegmental atelectasis.   Electronically Signed   By: Vinnie Langton M.D.   On: 10/04/2013 15:39   Dg Abd Portable 1v  10/10/2013   CLINICAL DATA:  Constipation, abdominal pain  EXAM: PORTABLE ABDOMEN - 1 VIEW  COMPARISON:  None.  FINDINGS: There is nonspecific nonobstructive bowel gas pattern. The study is limited by patient's large body habitus. Moderate stool in right colon.  IMPRESSION: Nonspecific nonobstructive bowel gas pattern. Moderate stool in right colon.   Electronically Signed   By: Lahoma Crocker M.D.   On: 10/10/2013 11:58   Ct Renal Stone Study  10/04/2013   CLINICAL DATA:  Right flank pain.  EXAM: CT ABDOMEN AND PELVIS WITHOUT CONTRAST  TECHNIQUE: Multidetector CT imaging of the abdomen and pelvis was performed following the standard protocol without IV contrast.  COMPARISON:  09/16/2003.  FINDINGS: The lung bases demonstrate small effusions and a right basilar infiltrate. The heart is normal in size. No pericardial effusion  Advanced cirrhotic changes involving the liver without obvious hepatic mass. Gallbladder is normal. No common bile duct dilatation. The pancreas is grossly normal. The spleen is enlarged. No ascites. The adrenal glands are normal. There are bilateral renal calculi but no obstructing ureteral calculi or bladder calculi.  The stomach, duodenum, small bowel and colon are grossly normal without oral contrast. No inflammatory changes, mass lesions or obstructive findings. No mesenteric or retroperitoneal mass or adenopathy. The appendix is normal.  No pelvic mass or  adenopathy. The uterus is surgically absent. The bladder is normal. No inguinal mass or adenopathy.  The bony structures are unremarkable.  IMPRESSION: Right basilar infiltrate.  Advanced cirrhotic changes involving the liver without obvious hepatic mass. Mild associated splenomegaly.  Bilateral renal calculi but no obstructing ureteral calculi.   Electronically Signed   By: Kalman Jewels M.D.   On: 10/04/2013 01:55    Microbiology: No results found for this or any previous visit (from the past 240 hour(s)).   Labs: Basic Metabolic Panel:  Recent Labs Lab 10/08/13 1030 10/11/13 1004 10/11/13 1559 10/12/13 0415 10/13/13 0545  NA 135* 128* 131* 134* 130*  K 4.6 6.3* 4.9 4.4 3.8  CL 95* 90* 90* 94* 92*  CO2 27 23 25 28 25   GLUCOSE 165* 153* 188* 87 78  BUN 30* 24* 23 26* 28*  CREATININE 1.14* 1.02 1.32* 1.24* 1.28*  CALCIUM 9.4 9.1 9.2 9.3 8.9   Liver Function Tests: No results found for this basename: AST, ALT, ALKPHOS, BILITOT, PROT, ALBUMIN,  in the last 168 hours No results found for this basename: LIPASE, AMYLASE,  in the last 168 hours No results found for this basename: AMMONIA,  in the last 168 hours CBC:  Recent Labs Lab 10/09/13 0430 10/10/13 0701 10/11/13 0503 10/12/13 0415 10/13/13 0545  WBC 7.5 5.9 7.8 7.7 9.4  HGB 13.7 12.9 12.8 13.3 13.2  HCT 41.3 37.9 38.7 39.0 38.6  MCV 91.8 90.0 90.4 89.9 91.9  PLT 108* 104* 104* 120* 108*   Cardiac Enzymes: No results found for this basename: CKTOTAL, CKMB, CKMBINDEX, TROPONINI,  in the last 168 hours BNP: BNP (last 3 results)  Recent Labs  12/21/12  1630 07/14/13 2120  PROBNP 149.3* 40.7   CBG:  Recent Labs Lab 10/13/13 0635 10/13/13 1124 10/13/13 1630 10/13/13 2236 10/14/13 0650  GLUCAP 77 111* 190* 109* 88       Signed:  Ashely Joshua A  [facility] 10/14/2013, 10:52 AM

## 2013-10-26 ENCOUNTER — Inpatient Hospital Stay (HOSPITAL_COMMUNITY): Payer: Medicare Other

## 2013-10-26 ENCOUNTER — Emergency Department (HOSPITAL_COMMUNITY): Payer: Medicare Other

## 2013-10-26 ENCOUNTER — Inpatient Hospital Stay (HOSPITAL_COMMUNITY)
Admission: EM | Admit: 2013-10-26 | Discharge: 2013-11-02 | DRG: 871 | Disposition: A | Discharge status: Home Health | Payer: Medicare Other | Attending: Internal Medicine | Admitting: Internal Medicine

## 2013-10-26 DIAGNOSIS — N179 Acute kidney failure, unspecified: Secondary | ICD-10-CM | POA: Diagnosis not present

## 2013-10-26 DIAGNOSIS — B1910 Unspecified viral hepatitis B without hepatic coma: Secondary | ICD-10-CM | POA: Diagnosis not present

## 2013-10-26 DIAGNOSIS — Z23 Encounter for immunization: Secondary | ICD-10-CM | POA: Diagnosis not present

## 2013-10-26 DIAGNOSIS — J9601 Acute respiratory failure with hypoxia: Secondary | ICD-10-CM | POA: Diagnosis present

## 2013-10-26 DIAGNOSIS — R6520 Severe sepsis without septic shock: Secondary | ICD-10-CM

## 2013-10-26 DIAGNOSIS — Z7901 Long term (current) use of anticoagulants: Secondary | ICD-10-CM | POA: Diagnosis not present

## 2013-10-26 DIAGNOSIS — I82432 Acute embolism and thrombosis of left popliteal vein: Secondary | ICD-10-CM | POA: Diagnosis present

## 2013-10-26 DIAGNOSIS — Z889 Allergy status to unspecified drugs, medicaments and biological substances status: Secondary | ICD-10-CM | POA: Diagnosis not present

## 2013-10-26 DIAGNOSIS — J449 Chronic obstructive pulmonary disease, unspecified: Secondary | ICD-10-CM

## 2013-10-26 DIAGNOSIS — B1710 Acute hepatitis C without hepatic coma: Secondary | ICD-10-CM | POA: Diagnosis not present

## 2013-10-26 DIAGNOSIS — T380X5A Adverse effect of glucocorticoids and synthetic analogues, initial encounter: Secondary | ICD-10-CM

## 2013-10-26 DIAGNOSIS — F17200 Nicotine dependence, unspecified, uncomplicated: Secondary | ICD-10-CM

## 2013-10-26 DIAGNOSIS — G9340 Encephalopathy, unspecified: Secondary | ICD-10-CM | POA: Diagnosis not present

## 2013-10-26 DIAGNOSIS — D696 Thrombocytopenia, unspecified: Secondary | ICD-10-CM | POA: Diagnosis not present

## 2013-10-26 DIAGNOSIS — J440 Chronic obstructive pulmonary disease with acute lower respiratory infection: Secondary | ICD-10-CM | POA: Diagnosis not present

## 2013-10-26 DIAGNOSIS — I2510 Atherosclerotic heart disease of native coronary artery without angina pectoris: Secondary | ICD-10-CM | POA: Diagnosis not present

## 2013-10-26 DIAGNOSIS — R4182 Altered mental status, unspecified: Secondary | ICD-10-CM

## 2013-10-26 DIAGNOSIS — R6521 Severe sepsis with septic shock: Secondary | ICD-10-CM | POA: Diagnosis present

## 2013-10-26 DIAGNOSIS — I129 Hypertensive chronic kidney disease with stage 1 through stage 4 chronic kidney disease, or unspecified chronic kidney disease: Secondary | ICD-10-CM | POA: Diagnosis present

## 2013-10-26 DIAGNOSIS — I82409 Acute embolism and thrombosis of unspecified deep veins of unspecified lower extremity: Secondary | ICD-10-CM

## 2013-10-26 DIAGNOSIS — K7460 Unspecified cirrhosis of liver: Secondary | ICD-10-CM | POA: Diagnosis present

## 2013-10-26 DIAGNOSIS — F411 Generalized anxiety disorder: Secondary | ICD-10-CM | POA: Diagnosis not present

## 2013-10-26 DIAGNOSIS — D500 Iron deficiency anemia secondary to blood loss (chronic): Secondary | ICD-10-CM | POA: Diagnosis present

## 2013-10-26 DIAGNOSIS — A4152 Sepsis due to Pseudomonas: Principal | ICD-10-CM | POA: Diagnosis present

## 2013-10-26 DIAGNOSIS — A419 Sepsis, unspecified organism: Secondary | ICD-10-CM

## 2013-10-26 DIAGNOSIS — S82892A Other fracture of left lower leg, initial encounter for closed fracture: Secondary | ICD-10-CM

## 2013-10-26 DIAGNOSIS — Y95 Nosocomial condition: Secondary | ICD-10-CM | POA: Diagnosis present

## 2013-10-26 DIAGNOSIS — N1832 Chronic kidney disease, stage 3b: Secondary | ICD-10-CM | POA: Diagnosis present

## 2013-10-26 DIAGNOSIS — I82412 Acute embolism and thrombosis of left femoral vein: Secondary | ICD-10-CM | POA: Diagnosis present

## 2013-10-26 DIAGNOSIS — N183 Chronic kidney disease, stage 3 unspecified: Secondary | ICD-10-CM | POA: Diagnosis present

## 2013-10-26 DIAGNOSIS — R7881 Bacteremia: Secondary | ICD-10-CM

## 2013-10-26 DIAGNOSIS — Z881 Allergy status to other antibiotic agents status: Secondary | ICD-10-CM | POA: Diagnosis not present

## 2013-10-26 DIAGNOSIS — Z823 Family history of stroke: Secondary | ICD-10-CM | POA: Diagnosis not present

## 2013-10-26 DIAGNOSIS — B181 Chronic viral hepatitis B without delta-agent: Secondary | ICD-10-CM | POA: Diagnosis present

## 2013-10-26 DIAGNOSIS — I2699 Other pulmonary embolism without acute cor pulmonale: Secondary | ICD-10-CM

## 2013-10-26 DIAGNOSIS — Z801 Family history of malignant neoplasm of trachea, bronchus and lung: Secondary | ICD-10-CM | POA: Diagnosis not present

## 2013-10-26 DIAGNOSIS — R635 Abnormal weight gain: Secondary | ICD-10-CM

## 2013-10-26 DIAGNOSIS — D509 Iron deficiency anemia, unspecified: Secondary | ICD-10-CM | POA: Diagnosis present

## 2013-10-26 DIAGNOSIS — R739 Hyperglycemia, unspecified: Secondary | ICD-10-CM

## 2013-10-26 DIAGNOSIS — N200 Calculus of kidney: Secondary | ICD-10-CM

## 2013-10-26 DIAGNOSIS — J189 Pneumonia, unspecified organism: Secondary | ICD-10-CM

## 2013-10-26 DIAGNOSIS — J159 Unspecified bacterial pneumonia: Secondary | ICD-10-CM

## 2013-10-26 DIAGNOSIS — D7582 Heparin induced thrombocytopenia (HIT): Secondary | ICD-10-CM | POA: Diagnosis present

## 2013-10-26 DIAGNOSIS — F319 Bipolar disorder, unspecified: Secondary | ICD-10-CM | POA: Diagnosis not present

## 2013-10-26 DIAGNOSIS — F17210 Nicotine dependence, cigarettes, uncomplicated: Secondary | ICD-10-CM | POA: Diagnosis not present

## 2013-10-26 DIAGNOSIS — Z720 Tobacco use: Secondary | ICD-10-CM

## 2013-10-26 DIAGNOSIS — F1410 Cocaine abuse, uncomplicated: Secondary | ICD-10-CM

## 2013-10-26 HISTORY — DX: Altered mental status, unspecified: R41.82

## 2013-10-26 HISTORY — DX: Acute embolism and thrombosis of unspecified deep veins of unspecified lower extremity: I82.409

## 2013-10-26 HISTORY — DX: Calculus of kidney: N20.0

## 2013-10-26 HISTORY — DX: Gastro-esophageal reflux disease without esophagitis: K21.9

## 2013-10-26 HISTORY — DX: Depression, unspecified: F32.A

## 2013-10-26 HISTORY — DX: Anxiety disorder, unspecified: F41.9

## 2013-10-26 HISTORY — DX: Unspecified osteoarthritis, unspecified site: M19.90

## 2013-10-26 HISTORY — DX: Cardiac murmur, unspecified: R01.1

## 2013-10-26 HISTORY — DX: Personal history of other medical treatment: Z92.89

## 2013-10-26 HISTORY — DX: Major depressive disorder, single episode, unspecified: F32.9

## 2013-10-26 HISTORY — DX: Bipolar disorder, unspecified: F31.9

## 2013-10-26 HISTORY — DX: Unspecified viral hepatitis B without hepatic coma: B19.10

## 2013-10-26 LAB — COMPREHENSIVE METABOLIC PANEL WITH GFR
ALT: 58 U/L — ABNORMAL HIGH (ref 0–35)
AST: 36 U/L (ref 0–37)
Albumin: 2.7 g/dL — ABNORMAL LOW (ref 3.5–5.2)
Alkaline Phosphatase: 98 U/L (ref 39–117)
Anion gap: 12 (ref 5–15)
BUN: 17 mg/dL (ref 6–23)
CO2: 25 meq/L (ref 19–32)
Calcium: 8.6 mg/dL (ref 8.4–10.5)
Chloride: 98 meq/L (ref 96–112)
Creatinine, Ser: 1.77 mg/dL — ABNORMAL HIGH (ref 0.50–1.10)
GFR calc Af Amer: 36 mL/min — ABNORMAL LOW
GFR calc non Af Amer: 31 mL/min — ABNORMAL LOW
Glucose, Bld: 101 mg/dL — ABNORMAL HIGH (ref 70–99)
Potassium: 3.9 meq/L (ref 3.7–5.3)
Sodium: 135 meq/L — ABNORMAL LOW (ref 137–147)
Total Bilirubin: 1 mg/dL (ref 0.3–1.2)
Total Protein: 6.1 g/dL (ref 6.0–8.3)

## 2013-10-26 LAB — URINE MICROSCOPIC-ADD ON

## 2013-10-26 LAB — I-STAT CG4 LACTIC ACID, ED: Lactic Acid, Venous: 3.75 mmol/L — ABNORMAL HIGH (ref 0.5–2.2)

## 2013-10-26 LAB — URINALYSIS, ROUTINE W REFLEX MICROSCOPIC
BILIRUBIN URINE: NEGATIVE
Bilirubin Urine: NEGATIVE
Glucose, UA: NEGATIVE mg/dL
Glucose, UA: NEGATIVE mg/dL
KETONES UR: NEGATIVE mg/dL
Ketones, ur: 15 mg/dL — AB
Leukocytes, UA: NEGATIVE
NITRITE: NEGATIVE
Nitrite: NEGATIVE
PH: 7.5 (ref 5.0–8.0)
PROTEIN: 30 mg/dL — AB
Protein, ur: 30 mg/dL — AB
Specific Gravity, Urine: 1.016 (ref 1.005–1.030)
Specific Gravity, Urine: 1.017 (ref 1.005–1.030)
Urobilinogen, UA: 1 mg/dL (ref 0.0–1.0)
Urobilinogen, UA: 0.2 mg/dL (ref 0.0–1.0)
pH: 7.5 (ref 5.0–8.0)

## 2013-10-26 LAB — PHOSPHORUS: Phosphorus: 1.2 mg/dL — ABNORMAL LOW (ref 2.3–4.6)

## 2013-10-26 LAB — CBC WITH DIFFERENTIAL/PLATELET
Basophils Absolute: 0 K/uL (ref 0.0–0.1)
Basophils Relative: 0 % (ref 0–1)
Eosinophils Absolute: 0 K/uL (ref 0.0–0.7)
Eosinophils Relative: 0 % (ref 0–5)
HCT: 34.3 % — ABNORMAL LOW (ref 36.0–46.0)
Hemoglobin: 11.4 g/dL — ABNORMAL LOW (ref 12.0–15.0)
Lymphocytes Relative: 7 % — ABNORMAL LOW (ref 12–46)
Lymphs Abs: 0.5 K/uL — ABNORMAL LOW (ref 0.7–4.0)
MCH: 31.2 pg (ref 26.0–34.0)
MCHC: 33.2 g/dL (ref 30.0–36.0)
MCV: 94 fL (ref 78.0–100.0)
Monocytes Absolute: 0.6 K/uL (ref 0.1–1.0)
Monocytes Relative: 9 % (ref 3–12)
Neutro Abs: 6.1 K/uL (ref 1.7–7.7)
Neutrophils Relative %: 84 % — ABNORMAL HIGH (ref 43–77)
Platelets: 67 K/uL — ABNORMAL LOW (ref 150–400)
RBC: 3.65 MIL/uL — ABNORMAL LOW (ref 3.87–5.11)
RDW: 13.4 % (ref 11.5–15.5)
WBC: 7.3 K/uL (ref 4.0–10.5)

## 2013-10-26 LAB — APTT: APTT: 52 s — AB (ref 24–37)

## 2013-10-26 LAB — CARBOXYHEMOGLOBIN
CARBOXYHEMOGLOBIN: 1.3 % (ref 0.5–1.5)
Methemoglobin: 0.7 % (ref 0.0–1.5)
O2 Saturation: 70.6 %
Total hemoglobin: 9.8 g/dL — ABNORMAL LOW (ref 12.0–16.0)

## 2013-10-26 LAB — POCT I-STAT 3, ART BLOOD GAS (G3+)
Acid-base deficit: 3 mmol/L — ABNORMAL HIGH (ref 0.0–2.0)
Bicarbonate: 21.6 mEq/L (ref 20.0–24.0)
O2 Saturation: 100 %
PH ART: 7.374 (ref 7.350–7.450)
Patient temperature: 103.1
TCO2: 23 mmol/L (ref 0–100)
pCO2 arterial: 37.9 mmHg (ref 35.0–45.0)
pO2, Arterial: 267 mmHg — ABNORMAL HIGH (ref 80.0–100.0)

## 2013-10-26 LAB — RAPID URINE DRUG SCREEN, HOSP PERFORMED
AMPHETAMINES: NOT DETECTED
BENZODIAZEPINES: POSITIVE — AB
Barbiturates: NOT DETECTED
COCAINE: NOT DETECTED
Opiates: NOT DETECTED
Tetrahydrocannabinol: NOT DETECTED

## 2013-10-26 LAB — GLUCOSE, CAPILLARY
GLUCOSE-CAPILLARY: 118 mg/dL — AB (ref 70–99)
GLUCOSE-CAPILLARY: 115 mg/dL — AB (ref 70–99)

## 2013-10-26 LAB — STREP PNEUMONIAE URINARY ANTIGEN: Strep Pneumo Urinary Antigen: NEGATIVE

## 2013-10-26 LAB — CORTISOL: Cortisol, Plasma: 22.4 ug/dL

## 2013-10-26 LAB — PROTIME-INR
INR: 4.04 — AB (ref 0.00–1.49)
Prothrombin Time: 39.3 seconds — ABNORMAL HIGH (ref 11.6–15.2)

## 2013-10-26 LAB — MAGNESIUM: Magnesium: 1.4 mg/dL — ABNORMAL LOW (ref 1.5–2.5)

## 2013-10-26 LAB — LACTIC ACID, PLASMA: LACTIC ACID, VENOUS: 2.8 mmol/L — AB (ref 0.5–2.2)

## 2013-10-26 LAB — MRSA PCR SCREENING: MRSA by PCR: NEGATIVE

## 2013-10-26 LAB — PROCALCITONIN: Procalcitonin: 43.2 ng/mL

## 2013-10-26 MED ORDER — SODIUM CHLORIDE 0.9 % IV SOLN
1000.0000 mL | INTRAVENOUS | Status: DC
  Administered 2013-10-26 – 2013-10-27 (×2): 1000 mL via INTRAVENOUS

## 2013-10-26 MED ORDER — NOREPINEPHRINE BITARTRATE 1 MG/ML IV SOLN
5.0000 ug/min | INTRAVENOUS | Status: DC
  Administered 2013-10-26 (×2): 50 ug/min via INTRAVENOUS
  Administered 2013-10-26: 200 ug/min via INTRAVENOUS
  Administered 2013-10-27: 50 ug/min via INTRAVENOUS
  Administered 2013-10-27: 18 ug/min via INTRAVENOUS
  Filled 2013-10-26 (×6): qty 16

## 2013-10-26 MED ORDER — HYDROCORTISONE NA SUCCINATE PF 100 MG IJ SOLR
50.0000 mg | Freq: Four times a day (QID) | INTRAMUSCULAR | Status: DC
  Administered 2013-10-26 – 2013-10-29 (×12): 50 mg via INTRAVENOUS
  Filled 2013-10-26 (×16): qty 1

## 2013-10-26 MED ORDER — FENTANYL CITRATE 0.05 MG/ML IJ SOLN
100.0000 ug | Freq: Once | INTRAMUSCULAR | Status: AC
  Administered 2013-10-26: 100 ug via INTRAVENOUS
  Filled 2013-10-26: qty 2

## 2013-10-26 MED ORDER — CETYLPYRIDINIUM CHLORIDE 0.05 % MT LIQD
7.0000 mL | Freq: Four times a day (QID) | OROMUCOSAL | Status: DC
  Administered 2013-10-26 – 2013-10-30 (×15): 7 mL via OROMUCOSAL

## 2013-10-26 MED ORDER — PANTOPRAZOLE SODIUM 40 MG IV SOLR
40.0000 mg | Freq: Every day | INTRAVENOUS | Status: DC
  Administered 2013-10-26 – 2013-10-27 (×2): 40 mg via INTRAVENOUS
  Filled 2013-10-26 (×3): qty 40

## 2013-10-26 MED ORDER — ETOMIDATE 2 MG/ML IV SOLN
INTRAVENOUS | Status: AC
  Administered 2013-10-26: 20 mg
  Filled 2013-10-26: qty 20

## 2013-10-26 MED ORDER — DEXTROSE 5 % IV SOLN
1.0000 g | INTRAVENOUS | Status: DC
  Administered 2013-10-27 – 2013-10-28 (×2): 1 g via INTRAVENOUS
  Filled 2013-10-26 (×3): qty 1

## 2013-10-26 MED ORDER — SODIUM CHLORIDE 0.9 % IV BOLUS (SEPSIS): 1000.0000 mL | INTRAVENOUS | Status: DC | PRN

## 2013-10-26 MED ORDER — MIDAZOLAM HCL 2 MG/2ML IJ SOLN
2.0000 mg | Freq: Once | INTRAMUSCULAR | Status: AC
  Administered 2013-10-26: 2 mg via INTRAVENOUS
  Filled 2013-10-26: qty 2

## 2013-10-26 MED ORDER — CHLORHEXIDINE GLUCONATE 0.12 % MT SOLN
15.0000 mL | Freq: Two times a day (BID) | OROMUCOSAL | Status: DC
  Administered 2013-10-26 – 2013-10-30 (×8): 15 mL via OROMUCOSAL
  Filled 2013-10-26 (×7): qty 15

## 2013-10-26 MED ORDER — DEXTROSE 5 % IV SOLN
2.0000 g | Freq: Once | INTRAVENOUS | Status: AC
  Administered 2013-10-26: 2 g via INTRAVENOUS
  Filled 2013-10-26: qty 2

## 2013-10-26 MED ORDER — SODIUM CHLORIDE 0.9 % IV SOLN
0.0300 [IU]/min | INTRAVENOUS | Status: DC
  Administered 2013-10-26 – 2013-10-27 (×2): 0.03 [IU]/min via INTRAVENOUS
  Filled 2013-10-26 (×3): qty 2

## 2013-10-26 MED ORDER — FENTANYL BOLUS VIA INFUSION
50.0000 ug | INTRAVENOUS | Status: DC | PRN
  Filled 2013-10-26: qty 100

## 2013-10-26 MED ORDER — VANCOMYCIN HCL IN DEXTROSE 1-5 GM/200ML-% IV SOLN
1000.0000 mg | Freq: Once | INTRAVENOUS | Status: AC
  Administered 2013-10-26: 1000 mg via INTRAVENOUS
  Filled 2013-10-26: qty 200

## 2013-10-26 MED ORDER — NOREPINEPHRINE BITARTRATE 1 MG/ML IV SOLN
5.0000 ug/min | INTRAVENOUS | Status: DC
  Administered 2013-10-26: 5 ug/min via INTRAVENOUS
  Administered 2013-10-26: 10 ug/min via INTRAVENOUS
  Filled 2013-10-26: qty 4

## 2013-10-26 MED ORDER — LIDOCAINE HCL (CARDIAC) 20 MG/ML IV SOLN
INTRAVENOUS | Status: AC
  Filled 2013-10-26: qty 5

## 2013-10-26 MED ORDER — MIDAZOLAM HCL 2 MG/2ML IJ SOLN
1.0000 mg | INTRAMUSCULAR | Status: DC | PRN
  Administered 2013-10-27 – 2013-10-28 (×5): 2 mg via INTRAVENOUS
  Filled 2013-10-26 (×5): qty 2

## 2013-10-26 MED ORDER — VANCOMYCIN HCL IN DEXTROSE 750-5 MG/150ML-% IV SOLN
750.0000 mg | Freq: Two times a day (BID) | INTRAVENOUS | Status: DC
  Administered 2013-10-27 (×3): 750 mg via INTRAVENOUS
  Filled 2013-10-26 (×5): qty 150

## 2013-10-26 MED ORDER — LEVOFLOXACIN IN D5W 750 MG/150ML IV SOLN
750.0000 mg | INTRAVENOUS | Status: DC
  Administered 2013-10-26 – 2013-10-28 (×3): 750 mg via INTRAVENOUS
  Filled 2013-10-26 (×4): qty 150

## 2013-10-26 MED ORDER — SODIUM CHLORIDE 0.9 % IV BOLUS (SEPSIS)
30.0000 mL/kg | Freq: Once | INTRAVENOUS | Status: AC
  Administered 2013-10-26: 2781 mL via INTRAVENOUS

## 2013-10-26 MED ORDER — ACETAMINOPHEN 650 MG RE SUPP
1300.0000 mg | Freq: Once | RECTAL | Status: AC
  Administered 2013-10-26: 1300 mg via RECTAL
  Filled 2013-10-26: qty 2

## 2013-10-26 MED ORDER — SODIUM CHLORIDE 0.9 % IV SOLN
INTRAVENOUS | Status: AC
  Administered 2013-10-26: 23:00:00 via INTRAVENOUS

## 2013-10-26 MED ORDER — FENTANYL CITRATE 0.05 MG/ML IJ SOLN
50.0000 ug | Freq: Once | INTRAMUSCULAR | Status: AC
  Administered 2013-10-26: 50 ug via INTRAVENOUS

## 2013-10-26 MED ORDER — NOREPINEPHRINE BITARTRATE 1 MG/ML IV SOLN
2.0000 ug/min | INTRAVENOUS | Status: DC
  Administered 2013-10-26: 2 ug/min via INTRAVENOUS
  Filled 2013-10-26: qty 4

## 2013-10-26 MED ORDER — ROCURONIUM BROMIDE 50 MG/5ML IV SOLN
INTRAVENOUS | Status: AC
  Filled 2013-10-26: qty 2

## 2013-10-26 MED ORDER — SODIUM CHLORIDE 0.9 % IV SOLN
0.0000 ug/h | INTRAVENOUS | Status: DC
  Administered 2013-10-26 (×2): 50 ug/h via INTRAVENOUS
  Administered 2013-10-27: 100 ug/h via INTRAVENOUS
  Filled 2013-10-26 (×3): qty 50

## 2013-10-26 MED ORDER — ACETAMINOPHEN 325 MG PO TABS
650.0000 mg | ORAL_TABLET | Freq: Four times a day (QID) | ORAL | Status: DC | PRN
  Administered 2013-10-26 – 2013-10-29 (×2): 650 mg via ORAL
  Filled 2013-10-26 (×2): qty 2

## 2013-10-26 MED ORDER — SUCCINYLCHOLINE CHLORIDE 20 MG/ML IJ SOLN
INTRAMUSCULAR | Status: AC
  Administered 2013-10-26: 80 mg
  Filled 2013-10-26: qty 1

## 2013-10-26 NOTE — Progress Notes (Signed)
Contacted [facility] re: CT scans ordered by EDP- Exams still needed, but patient remains too unstable to come to CT scan.

## 2013-10-26 NOTE — Procedures (Signed)
Central Venous Catheter Insertion Procedure Note Celeste SHAI RASMUSSEN [id number] 08/11/1955  Procedure: Insertion of Central Venous Catheter Indications: Assessment of intravascular volume, Drug and/or fluid administration and Frequent blood sampling  Procedure Details Consent: Risks of procedure as well as the alternatives and risks of each were explained to the (patient/caregiver).  Consent for procedure obtained. Time Out: Verified patient identification, verified procedure, site/side was marked, verified correct patient position, special equipment/implants available, medications/allergies/relevent history reviewed, required imaging and test results available.  Performed  Maximum sterile technique was used including antiseptics, cap, gloves, gown, hand hygiene, mask and sheet. Skin prep: Chlorhexidine; local anesthetic administered A antimicrobial bonded/coated triple lumen catheter was placed in the left internal jugular vein using the Seldinger technique. Ultrasound guidance used.Yes.   Catheter placed to 20 cm. Blood aspirated via all 3 ports and then flushed x 3. Line sutured x 2 and dressing applied.  Evaluation Blood flow good Complications: No apparent complications Patient did tolerate procedure well. Chest X-ray ordered to verify placement.  CXR: normal.  Richardson Landry Minor ACNP Maryanna Shape PCCM Pager [phone] till 3 pm If no answer page [phone] 10/26/2013, 12:20 PM   I was present for procedure.  Chesley Mires, MD [facility] Pulmonary/Critical Care 10/26/2013, 1:11 PM Pager:  [phone] After 3pm call: [phone]

## 2013-10-26 NOTE — ED Notes (Signed)
Lactic Acid 3.75 mmol/L Given to Dr. Eulis Foster

## 2013-10-26 NOTE — ED Notes (Signed)
20 mg etomidate ?

## 2013-10-26 NOTE — ED Notes (Signed)
Critical care at bedside  

## 2013-10-26 NOTE — ED Provider Notes (Signed)
CSN: [id number]     Arrival date & time 10/26/13  3016 History   First MD Initiated Contact with Patient 10/26/13 [phone]     Chief Complaint  Patient presents with  . Altered Mental Status     (Consider location/radiation/quality/duration/timing/severity/associated sxs/prior Treatment) HPI  [name] is a 58 y.o. female who presents for evaluation of fall. She is unable to give history. According to EMS, she was found on floor at her nursing care facility, covered in feces. She was apparently found about one hour ago, and was last seen prior to that, at 5:30 AM, without any problems. Her temperature is reported to be 105, at the nursing care facility.  Level V Caveat- altered mental status   Past Medical History  Diagnosis Date  . Hypertension   . COPD (chronic obstructive pulmonary disease)   . Renal insufficiency   . Cirrhosis   . Pulmonary emboli   . Pneumonia    Past Surgical History  Procedure Laterality Date  . Abdominal hysterectomy     Family History  Problem Relation Age of Onset  . Stroke Mother   . Lung cancer Father    History  Substance Use Topics  . Smoking status: Former Smoker -- 1.00 packs/day    Types: Cigarettes  . Smokeless tobacco: Not on file  . Alcohol Use: No     Comment: former drinker   OB History   Grav Para Term Preterm Abortions TAB SAB Ect Mult Living                 Review of Systems  Unable to perform ROS     Allergies  Duloxetine; Gabapentin; and Neomycin-bacitracin zn-polymyx  Home Medications   Prior to Admission medications   Medication Sig Start Date End Date Taking? Authorizing Provider  albuterol (PROVENTIL HFA;VENTOLIN HFA) 108 (90 BASE) MCG/ACT inhaler Inhale 2 puffs into the lungs every 6 (six) hours as needed. For shortness of breath    Yes Historical Provider, MD  ALPRAZolam Duanne Moron) 1 MG tablet Take 1 mg by mouth 3 (three) times daily as needed. For anxiety   Yes Historical Provider, MD  amitriptyline (ELAVIL)  50 MG tablet Take 100 mg by mouth at bedtime.    Yes Historical Provider, MD  budesonide-formoterol (SYMBICORT) 160-4.5 MCG/ACT inhaler Inhale 2 puffs into the lungs 2 (two) times daily.     Yes Historical Provider, MD  entecavir (BARACLUDE) 0.5 MG tablet Take 0.5 mg by mouth every other day.    Yes Historical Provider, MD  Iloperidone (FANAPT) 6 MG TABS Take 12 mg by mouth daily.    Yes Historical Provider, MD  nicotine (NICODERM CQ - DOSED IN MG/24 HR) 7 mg/24hr patch Place 1 patch (7 mg total) onto the skin daily. 10/14/13  Yes Belkys A Regalado, MD  omeprazole (PRILOSEC) 40 MG capsule Take 80 mg by mouth daily.    Yes Historical Provider, MD  polyethylene glycol (MIRALAX / GLYCOLAX) packet Take 17 g by mouth 2 (two) times daily. 10/14/13  Yes Belkys A Regalado, MD  Rivaroxaban (XARELTO) 15 MG TABS tablet Take 15 mg by mouth 2 (two) times daily with a meal.   Yes Historical Provider, MD  rivaroxaban (XARELTO) 20 MG TABS tablet Take 1 tablet (20 mg total) by mouth daily with supper. 10/31/13  Yes Belkys A Regalado, MD  senna (SENOKOT) 8.6 MG TABS tablet Take 3 tablets (25.8 mg total) by mouth at bedtime. 10/14/13  Yes Elmarie Shiley, MD  tenofovir (  VIREAD) 300 MG tablet Take 300 mg by mouth every other day.   Yes Historical Provider, MD  tiotropium (SPIRIVA) 18 MCG inhalation capsule Place 18 mcg into inhaler and inhale daily.     Yes Historical Provider, MD  traMADol (ULTRAM) 50 MG tablet Take 50 mg by mouth 2 (two) times daily.  06/18/13  Yes Historical Provider, MD   BP 77/49  Pulse 116  Temp(Src) 103.4 F (39.7 C) (Rectal)  Resp 27  SpO2 100% Physical Exam  Nursing note and vitals reviewed. Constitutional: She appears well-developed.  Obese, frail. She has feces scattered around her lower torso and legs, bilaterally.  HENT:  Head: Normocephalic and atraumatic.  Mucous membranes are dry. There is no oral bleeding. No visible or palpable cranial deformity, or scalp laceration.  Eyes:  Conjunctivae and EOM are normal. Pupils are equal, round, and reactive to light.  Neck: Normal range of motion and phonation normal. Neck supple.  Cardiovascular: Normal rate and regular rhythm.   Pulmonary/Chest: Effort normal. No respiratory distress. She has wheezes (Bilateral). She exhibits no tenderness.  There is some left lower abdominal wall ecchymosis of age indeterminate.   Abdominal: Soft. She exhibits no distension. There is no tenderness. There is no guarding.  Musculoskeletal: Normal range of motion.  She has diffuse tenderness to palpation of the head, neck, arms, legs, back. I am able to move her hips, knees, and ankles without pain or limitation. Arms move normally with passive efforts, and are without deformity.  Neurological: She is alert. She exhibits normal muscle tone.  She is confused, talks and unintelligible statements. She does not cooperate with commands to move, but seems aware of examiners, and appreciative of evaluation and treatment efforts.  Skin: Skin is warm and dry.  Psychiatric:  She appears depressed    ED Course  Procedures (including critical care time)  Initial evaluation is consistent with sepsis, sepsis protocol initiated at time of evaluation.  Medications  sodium chloride 0.9 % bolus 30 mL/kg (2,781 mLs Intravenous New Bag/Given 10/26/13 0852)    Followed by  0.9 %  sodium chloride infusion (not administered)  ceFEPIme (MAXIPIME) 2 g in dextrose 5 % 50 mL IVPB (not administered)  acetaminophen (TYLENOL) suppository 1,300 mg (not administered)  norepinephrine (LEVOPHED) 4 mg in dextrose 5 % 250 mL infusion (not administered)  vancomycin (VANCOCIN) IVPB 1000 mg/200 mL premix (not administered)    Patient Vitals for the past 24 hrs:  BP Temp Temp src Pulse Resp SpO2  10/26/13 0934 77/49 mmHg - - 116 27 100 %  10/26/13 0927 79/41 mmHg - - 115 24 100 %  10/26/13 0915 90/54 mmHg - - 116 22 100 %  10/26/13 0852 88/53 mmHg - - 118 19 100 %   10/26/13 0849 80/57 mmHg - - 118 18 100 %  10/26/13 0845 79/61 mmHg - - 118 21 100 %  10/26/13 0842 96/61 mmHg - - 117 23 97 %  10/26/13 0834 - 103.4 F (39.7 C) Rectal - - -  10/26/13 0831 - - - 114 - 96 %  10/26/13 0830 80/49 mmHg - - 113 - 96 %  10/26/13 0828 - - - - - 99 %  10/26/13 0826 124/55 mmHg - - - - -    9:47 AM Reevaluation with update and discussion. After initial assessment and treatment, an updated evaluation reveals persistent hypotension, not responsive to 2 L of IV fluid. 3rd liter infusing at this time. Lactate is elevated. She remains alert  and can follow some commands, now. IV, norepinephrine, ordered for persistent hypotension in the face of infection, severe sepsis. Trayquan Kolakowski L   Case discussed with cardiology, Dr. Marlou Porch; he will see patient as a consultant, and requests hospitalist admission  10:50-Consult for Admission, HOSPITALIST, DR. Rae Lips HAS, WILL ADMIT THE PATIENT.   CRITICAL CARE Performed by: Richarda Blade Total critical care time: 50 minutes  Critical care time was exclusive of separately billable procedures and treating other patients. Critical care was necessary to treat or prevent imminent or life-threatening deterioration. Critical care was time spent personally by me on the following activities: development of treatment plan with patient and/or surrogate as well as nursing, discussions with consultants, evaluation of patient's response to treatment, examination of patient, obtaining history from patient or surrogate, ordering and performing treatments and interventions, ordering and review of laboratory studies, ordering and review of radiographic studies, pulse oximetry and re-evaluation of patient's condition.   Labs Review Labs Reviewed  I-STAT CG4 LACTIC ACID, ED - Abnormal; Notable for the following:    Lactic Acid, Venous 3.75 (*)    All other components within normal limits  CULTURE, BLOOD (ROUTINE X 2)  CULTURE, BLOOD (ROUTINE X  2)  URINE CULTURE  CBC WITH DIFFERENTIAL  COMPREHENSIVE METABOLIC PANEL  URINALYSIS, ROUTINE W REFLEX MICROSCOPIC    Imaging Review Dg Chest Port 1 View  10/26/2013   CLINICAL DATA:  Altered mental status. Increased shortness of breath. Decreased oxygen saturation. History of COPD and pneumonia. Initial encounter.  EXAM: PORTABLE CHEST - 1 VIEW  COMPARISON:  10/10/2013; 10/04/2013; chest CT - 10/08/2013  FINDINGS: The examination is degraded due to patient body habitus, portable technique and hypoventilation.  Grossly unchanged enlarged cardiac silhouette and mediastinal contours given kyphotic projection. Grossly unchanged mild tortuosity and ectasia of the thoracic aorta. Lung volumes remain reduced with worsening bibasilar heterogeneous opacities. Worsening heterogeneous opacities with the right upper lung. No pleural effusion or pneumothorax. No evidence of edema. No acute osseus abnormalities.  IMPRESSION: Right upper and bilateral lower lung heterogeneous airspace opacities worrisome for multifocal infection on this hypoventilated, portable and kyphotic examination. A follow-up chest radiograph in 4 to 6 weeks after treatment is recommended to ensure resolution.   Electronically Signed   By: Sandi Mariscal M.D.   On: 10/26/2013 08:58     EKG Interpretation   Date/Time:  Sunday October 26 2013 08:30:42 EDT Ventricular Rate:  114 PR Interval:  146 QRS Duration: 91 QT Interval:  358 QTC Calculation: 493 R Axis:   33 Text Interpretation:  Sinus tachycardia Nonspecific T abnormalities,  lateral leads Borderline prolonged QT interval Since last tracing QT has  lengthened Confirmed by Eulis Foster  MD, Kiley Solimine ([id number]) on 10/26/2013 11:30:51  AM      MDM   Final diagnoses:  Severe sepsis  HCAP (healthcare-associated pneumonia)    Multifocal pneumonia, healthcare associated, with severe sepsis. Lactate is elevated. Patient required vasopressors in the emergency department to improve her blood  pressure.    Nursing Notes Reviewed/ Care Coordinated, and agree without changes. Applicable Imaging Reviewed.  Interpretation of Laboratory Data incorporated into ED treatment Plan: Admit    Richarda Blade, MD 10/26/13 1144

## 2013-10-26 NOTE — ED Notes (Signed)
Richardson Landry Minor, ACNP to intubate pt.  Dr Halford Chessman at bedside.

## 2013-10-26 NOTE — Progress Notes (Signed)
Transported pt from A8 to 87M room 2 on ventilator.

## 2013-10-26 NOTE — Progress Notes (Addendum)
ANTIBIOTIC CONSULT NOTE - INITIAL  Pharmacy Consult for cefepime Indication: urosepsis  Allergies  Allergen Reactions  . Duloxetine Other (See Comments)    REACTION: Headache  . Gabapentin Other (See Comments)    REACTION: rash in mouth  . Neomycin-Bacitracin Zn-Polymyx Other (See Comments)    REACTION: rash/hives    Patient Measurements:   Adjusted Body Weight:   Vital Signs: Temp: 103.4 F (39.7 C) (10/04 0834) Temp Source: Rectal (10/04 0834) BP: 84/48 mmHg (10/04 0945) Pulse Rate: 119 (10/04 0945) Intake/Output from previous day:   Intake/Output from this shift:    Labs:  Recent Labs  10/26/13 0913  WBC 7.3  HGB 11.4*  PLT PENDING  CREATININE 1.77*   The CrCl is unknown because both a height and weight (above a minimum accepted value) are required for this calculation. No results found for this basename: VANCOTROUGH, Corlis Leak, VANCORANDOM, [place], GENTPEAK, GENTRANDOM, TOBRATROUGH, TOBRAPEAK, TOBRARND, AMIKACINPEAK, AMIKACINTROU, AMIKACIN,  in the last 72 hours   Microbiology: Recent Results (from the past 720 hour(s))  MRSA PCR SCREENING     Status: None   Collection Time    10/04/13  3:55 AM      Result Value Ref Range Status   MRSA by PCR NEGATIVE  NEGATIVE Final   Comment:            The GeneXpert MRSA Assay (FDA     approved for NASAL specimens     only), is one component of a     comprehensive MRSA colonization     surveillance program. It is not     intended to diagnose MRSA     infection nor to guide or     monitor treatment for     MRSA infections.  CULTURE, BLOOD (ROUTINE X 2)     Status: None   Collection Time    10/04/13  7:37 AM      Result Value Ref Range Status   Specimen Description BLOOD RIGHT ARM   Final   Special Requests BOTTLES DRAWN AEROBIC AND ANAEROBIC 10CC   Final   Culture  Setup Time     Final   Value: 10/04/2013 15:17     Performed at [facility]   Culture     Final   Value: NO GROWTH 5 DAYS     Note:  Culture results may be compromised due to an excessive volume of blood received in culture bottles.     Performed at [facility]   Report Status 10/10/2013 FINAL   Final  CULTURE, BLOOD (ROUTINE X 2)     Status: None   Collection Time    10/04/13  7:46 AM      Result Value Ref Range Status   Specimen Description BLOOD RIGHT HAND   Final   Special Requests BOTTLES DRAWN AEROBIC AND ANAEROBIC 10CC   Final   Culture  Setup Time     Final   Value: 10/04/2013 15:18     Performed at [facility]   Culture     Final   Value: NO GROWTH 5 DAYS     Note: Culture results may be compromised due to an excessive volume of blood received in culture bottles.     Performed at [facility]   Report Status 10/10/2013 FINAL   Final    Medical History: Past Medical History  Diagnosis Date  . Hypertension   . COPD (chronic obstructive pulmonary disease)   . Renal insufficiency   .  Cirrhosis   . Pulmonary emboli   . Pneumonia     Medications:  Anti-infectives   Start     Dose/Rate Route Frequency Ordered Stop   10/27/13 1000  ceFEPIme (MAXIPIME) 1 g in dextrose 5 % 50 mL IVPB     1 g 100 mL/hr over 30 Minutes Intravenous Every 24 hours 10/26/13 1005     10/26/13 1000  vancomycin (VANCOCIN) IVPB 1000 mg/200 mL premix     1,000 mg 200 mL/hr over 60 Minutes Intravenous  Once 10/26/13 0949     10/26/13 0900  ceFEPIme (MAXIPIME) 2 g in dextrose 5 % 50 mL IVPB     2 g 100 mL/hr over 30 Minutes Intravenous  Once 10/26/13 8832       Assessment: 65 yof presented to the ED with AMS. To start cefepime for possible urosepsis. Tmax is 103.4, lactic acid is 3.75 and Scr is elevated at 1.77. Also ordered a 1x dose of vancomycin.   Cefepime 10/4>> Vanc x 1 10/4  Goal of Therapy:  Eradication of infection  Plan:  1. Cefepime 2gm IV x 1 then 1gm IV Q24H 2. F/u renal fxn, C&S, clinical status and trough at SS 3. F/u need to continue vancomycin  Jaizon Deroos, Rande Lawman 10/26/2013,10:06 AM  Addendum:  Continuing vanc  Plan: 1. Vanc 750mg  IV Q12H 2. F/u renal fxn, C&S, clinical status and trough at [facility], PharmD, BCPS Pager # [phone] 10/26/2013 12:06 PM

## 2013-10-26 NOTE — Procedures (Signed)
Intubation Procedure Note [name] [id number] 1955-04-28  Procedure: Intubation Indications: Airway protection and maintenance  Procedure Details Consent: Unable to obtain consent because of altered level of consciousness. Time Out: Verified patient identification, verified procedure, site/side was marked, verified correct patient position, special equipment/implants available, medications/allergies/relevent history reviewed, required imaging and test results available.  Performed  Maximum sterile technique was used including gloves, gown and mask.  MAC and 3    Evaluation Hemodynamic Status: BP stable throughout; O2 sats: stable throughout Patient's Current Condition: stable Complications: No apparent complications Patient did tolerate procedure well. Chest X-ray ordered to verify placement.  CXR: pending  Pt intubated for airway protection due to altered mental status from severe sepsis. Pt intubated by Gaylyn Lambert, NP and Dr Halford Chessman. Pt presented with difficult airway with dried secretions and hemoptysis. Attempted x3 with success on 3rd insertion using glidescope #3 with 7.5 ett secured at 22 at the lip. Tube secured and verified with bilateral breath sounds, positive color change and CXR pending. Pt stable throughout procedure.    Jesse Sans 10/26/2013

## 2013-10-26 NOTE — ED Notes (Signed)
Pt intubated.  Color change present.  Bilateral breath sounds.

## 2013-10-26 NOTE — Procedures (Signed)
Arterial Catheter Insertion Procedure Note Veronda KATY BRICKELL [id number] 17-Sep-1955  Procedure: Insertion of Arterial Catheter  Indications: Blood pressure monitoring  Procedure Details Consent: Risks of procedure as well as the alternatives and risks of each were explained to the (patient/caregiver).  Consent for procedure obtained. Time Out: Verified patient identification, verified procedure, site/side was marked, verified correct patient position, special equipment/implants available, medications/allergies/relevent history reviewed, required imaging and test results available.  Performed  Maximum sterile technique was used including antiseptics. Skin prep: Chlorhexidine; local anesthetic administered 20 gauge catheter was inserted into right radial artery using the Seldinger technique.  Evaluation Blood flow good; BP tracing good. Complications: No apparent complications.   Closson, Mirah Nevins 10/26/2013

## 2013-10-26 NOTE — Procedures (Signed)
Intubation Procedure Note [name] [id number] 12-23-1955  Procedure: Intubation Indications: Airway protection and maintenance  Procedure Details Consent: Unable to obtain consent because of altered level of consciousness. Time Out: Verified patient identification, verified procedure, site/side was marked, verified correct patient position, special equipment/implants available, medications/allergies/relevent history reviewed, required imaging and test results available.  Performed  MAC and 3 Medications:  Fentanyl  Etomidate 20 mg Versed NMB Anectine 80 mg   Evaluation Hemodynamic Status: BP stable throughout; O2 sats: transiently fell during during procedure Patient's Current Condition: stable Complications: No apparent complications Patient did tolerate procedure well. Chest X-ray ordered to verify placement.  CXR: ETT in good position.   Richardson Landry Minor ACNP Maryanna Shape PCCM Pager [phone] till 3 pm If no answer page [phone] 10/26/2013, 12:22 PM  I was present for procedure.  Chesley Mires, MD [facility] Pulmonary/Critical Care 10/26/2013, 1:12 PM Pager:  [phone] After 3pm call: [phone]

## 2013-10-26 NOTE — ED Notes (Signed)
Ice packs placed around pt

## 2013-10-26 NOTE — ED Notes (Signed)
Pt from [facility] via [facility] with c/o pain everywhere s/p unwitnessed fall this am.  Pt had incontinence and is presenting with confusion and increasing shortness of breath.  Normally on 4 L nasal cannula, was 93%, increased to 6L and 99%.  EMS report initial BP 99/70, given 500 mL NS bolus, 108/71.  Staff reported to EMS temp of 105. EMS got 101.7.  Right leg weakness is normal.  Lungs clear, bilateral diminished at bases.  12 lead unremarkable.  Pt reported nausea with no vomiting, given 4 mg Zofran.

## 2013-10-26 NOTE — Progress Notes (Signed)
[facility] Progress Note Patient Name: [name] DOB: [birth year] MRN: [id number]   Date of Service  10/26/2013  HPI/Events of Note  Hypotension on high doses of levophed and vasopressin.    eICU Interventions  Fluid bolus.  1 liter NS     Intervention Category Intermediate Interventions: Hypotension - evaluation and management  Mauri Brooklyn, P 10/26/2013, 10:55 PM

## 2013-10-26 NOTE — Progress Notes (Signed)
Pharmacy consulted to add levaquin to cefepime and vancomycin for HCAP. 57 yo female smoker from SNF with fever, altered mental status, respiratory failure from PNA with septic shock. She had recent hospital stay for PNA and PE. She has hx of HTN, cirrhosis, CKD, COPD.  WBC 7.3, Wt 107 kg, creat 1.77, creat cl ~ 60 ml/min., hypothermic.  10/4 CXR: RUL opacity suggesting PNA Plan: levaquin 750 mg IV q24 F/u renal fxn, wbc, temp, culture data  Eudelia Bunch, Pharm.D. [phone] 10/26/2013 2:57 PM

## 2013-10-26 NOTE — ED Notes (Addendum)
1330- Patient taken to 2MW-still too unstable- RN will call us when ready 1145- Trying to stabilize patient's BP  1115 Central line in, awaiting CXR 1045-Pt getting central line, RN to call RN to call when Ready @0915  Patient not ready for transport to CT @ 0908- RN will call when ready ([place], transporter)

## 2013-10-26 NOTE — H&P (Signed)
PULMONARY / CRITICAL CARE MEDICINE   Name: [name] MRN: [id number] DOB: 01/25/1955    ADMISSION DATE:  10/26/2013   REFERRING MD :  EDP  CHIEF COMPLAINT:  Shock  INITIAL PRESENTATION: 58 yo female smoker from SNF with fever, altered mental status, respiratory failure from PNA with septic shock.  She had recent hospital stay for PNA and PE.  She has hx of HTN, cirrhosis, CKD, COPD.  STUDIES:  10/4 ct head  SIGNIFICANT EVENTS: 10/4 found down in shock, VDRF   HISTORY OF PRESENT ILLNESS:   58 yo WF discharged 9/22 from  to SNF with discharge dx of: CAP, HTN, Cirrhosis, CKD, COPD, PE, Tibacco abuse. She presents to [facility] ED 10/4 after being found on floor in SNF covered in feces, fever 104 and hypotensive. IN ED she has refractory hypotension despite levoiphed , 3 litres of IVF. PCCM asked to admit with diagnosis of sepsis . Note her CxR could have pna. We will start broad spectrum abx, place cvl and admit to ICU.  PAST MEDICAL HISTORY :   has a past medical history of Hypertension; COPD (chronic obstructive pulmonary disease); Renal insufficiency; Cirrhosis; Pulmonary emboli; and Pneumonia.  has past surgical history that includes Abdominal hysterectomy. Prior to Admission medications   Medication Sig Start Date End Date Taking? Authorizing Provider  albuterol (PROVENTIL HFA;VENTOLIN HFA) 108 (90 BASE) MCG/ACT inhaler Inhale 2 puffs into the lungs every 6 (six) hours as needed. For shortness of breath    Yes Historical Provider, MD  ALPRAZolam Duanne Moron) 1 MG tablet Take 1 mg by mouth 3 (three) times daily as needed. For anxiety   Yes Historical Provider, MD  amitriptyline (ELAVIL) 50 MG tablet Take 100 mg by mouth at bedtime.    Yes Historical Provider, MD  budesonide-formoterol (SYMBICORT) 160-4.5 MCG/ACT inhaler Inhale 2 puffs into the lungs 2 (two) times daily.     Yes Historical Provider, MD  entecavir (BARACLUDE) 0.5 MG tablet Take 0.5 mg by mouth every other day.     Yes Historical Provider, MD  Iloperidone (FANAPT) 6 MG TABS Take 12 mg by mouth daily.    Yes Historical Provider, MD  nicotine (NICODERM CQ - DOSED IN MG/24 HR) 7 mg/24hr patch Place 1 patch (7 mg total) onto the skin daily. 10/14/13  Yes Belkys A Regalado, MD  omeprazole (PRILOSEC) 40 MG capsule Take 80 mg by mouth daily.    Yes Historical Provider, MD  polyethylene glycol (MIRALAX / GLYCOLAX) packet Take 17 g by mouth 2 (two) times daily. 10/14/13  Yes Belkys A Regalado, MD  Rivaroxaban (XARELTO) 15 MG TABS tablet Take 15 mg by mouth 2 (two) times daily with a meal.   Yes Historical Provider, MD  rivaroxaban (XARELTO) 20 MG TABS tablet Take 1 tablet (20 mg total) by mouth daily with supper. 10/31/13  Yes Belkys A Regalado, MD  senna (SENOKOT) 8.6 MG TABS tablet Take 3 tablets (25.8 mg total) by mouth at bedtime. 10/14/13  Yes Belkys A Regalado, MD  tenofovir (VIREAD) 300 MG tablet Take 300 mg by mouth every other day.   Yes Historical Provider, MD  tiotropium (SPIRIVA) 18 MCG inhalation capsule Place 18 mcg into inhaler and inhale daily.     Yes Historical Provider, MD  traMADol (ULTRAM) 50 MG tablet Take 50 mg by mouth 2 (two) times daily.  06/18/13  Yes Historical Provider, MD   Allergies  Allergen Reactions  . Duloxetine Other (See Comments)    REACTION: Headache  .  Gabapentin Other (See Comments)    REACTION: rash in mouth  . Neomycin-Bacitracin Zn-Polymyx Other (See Comments)    REACTION: rash/hives    FAMILY HISTORY:  indicated that her mother is deceased. She indicated that her father is deceased.  SOCIAL HISTORY:  reports that she has quit smoking. Her smoking use included Cigarettes. She smoked 1.00 pack per day. She does not have any smokeless tobacco history on file. She reports that she does not drink alcohol or use illicit drugs.  REVIEW OF SYSTEMS: NA  SUBJECTIVE:   VITAL SIGNS: Temp:  [103.4 F (39.7 C)] 103.4 F (39.7 C) (10/04 0834) Pulse Rate:  [113-119] 116 (10/04  1030) Resp:  [18-33] 33 (10/04 1030) BP: (77-124)/(41-62) 91/58 mmHg (10/04 1030) SpO2:  [96 %-100 %] 99 % (10/04 1030) HEMODYNAMICS:   VENTILATOR SETTINGS:   INTAKE / OUTPUT: No intake or output data in the 24 hours ending 10/26/13 1048  PHYSICAL EXAMINATION: General: Obese WF with confused affect Neuro:  Confused, stuttering speech HEENT:  No JVD/LAN Cardiovascular:  HSR RRR ST Lungs:  Mild rhonchi Abdomen:  Obese, non tender, +bs Musculoskeletal:  intact Skin:  Abrasions both knees.  LABS:  CBC  Recent Labs Lab 10/26/13 0913  WBC 7.3  HGB 11.4*  HCT 34.3*  PLT 67*   Coag's No results found for this basename: APTT, INR,  in the last 168 hours  BMET  Recent Labs Lab 10/26/13 0913  NA 135*  K 3.9  CL 98  CO2 25  BUN 17  CREATININE 1.77*  GLUCOSE 101*   Electrolytes  Recent Labs Lab 10/26/13 0913  CALCIUM 8.6   Sepsis Markers  Recent Labs Lab 10/26/13 0915  LATICACIDVEN 3.75*   ABG No results found for this basename: PHART, PCO2ART, PO2ART,  in the last 168 hours  Liver Enzymes  Recent Labs Lab 10/26/13 0913  AST 36  ALT 58*  ALKPHOS 98  BILITOT 1.0  ALBUMIN 2.7*   Cardiac Enzymes No results found for this basename: TROPONINI, PROBNP,  in the last 168 hours  Glucose No results found for this basename: GLUCAP,  in the last 168 hours  Imaging Dg Chest Portable 1 View  10/26/2013   CLINICAL DATA:  Post intubation, [facility] 10/26/2013, fever of 104 degrees, hypotensive, followup pneumonia  EXAM: PORTABLE CHEST - 1 VIEW  COMPARISON:  10/26/2013  FINDINGS: Endotracheal tube tip is 2.2 cm above the carina. No change in left central line.  Right upper lobe infiltrate stable. Areas of discoid atelectasis left mid to lower lung zones similar to prior study. Heart size normal.  IMPRESSION: Endotracheal tube as described. Stable right upper lobe opacity suggesting pneumonia.   Electronically Signed   By:  Skipper Cliche M.D.   On: 10/26/2013 12:47   Dg Chest Portable 1 View  10/26/2013   CLINICAL DATA:  Central line placement subsequent encounter.  EXAM: PORTABLE CHEST - 1 VIEW  COMPARISON:  10/26/2013; 10/10/2013  FINDINGS: Grossly unchanged borderline enlarged cardiac silhouette and mediastinal contours given persistently reduced lung volumes and patient rotation. Interval placement of a left jugular approach intravenous catheter with tip projected over the superior SVC. No pneumothorax. Heterogeneous and consolidative opacities within the right upper long have worsened in the interval. Grossly unchanged bilateral medial basilar heterogeneous opacities, left greater than right. No pleural effusion. Pulmonary venous congestion without frank evidence of edema. Unchanged bones.  IMPRESSION: 1. Interval placement of left jugular approach central venous catheter with  tip projected over the superior cavoatrial junction. No pneumothorax. 2. Worsening right upper lobe heterogeneous/consolidative opacities worrisome for infection. 3. Unchanged bilateral medial basilar heterogeneous opacities, atelectasis versus infiltrate.   Electronically Signed   By: Sandi Mariscal M.D.   On: 10/26/2013 11:36   Dg Chest Port 1 View  10/26/2013   CLINICAL DATA:  Altered mental status. Increased shortness of breath. Decreased oxygen saturation. History of COPD and pneumonia. Initial encounter.  EXAM: PORTABLE CHEST - 1 VIEW  COMPARISON:  10/10/2013; 10/04/2013; chest CT - 10/08/2013  FINDINGS: The examination is degraded due to patient body habitus, portable technique and hypoventilation.  Grossly unchanged enlarged cardiac silhouette and mediastinal contours given kyphotic projection. Grossly unchanged mild tortuosity and ectasia of the thoracic aorta. Lung volumes remain reduced with worsening bibasilar heterogeneous opacities. Worsening heterogeneous opacities with the right upper lung. No pleural effusion or pneumothorax. No  evidence of edema. No acute osseus abnormalities.  IMPRESSION: Right upper and bilateral lower lung heterogeneous airspace opacities worrisome for multifocal infection on this hypoventilated, portable and kyphotic examination. A follow-up chest radiograph in 4 to 6 weeks after treatment is recommended to ensure resolution.   Electronically Signed   By: Sandi Mariscal M.D.   On: 10/26/2013 08:58      ASSESSMENT / PLAN:  PULMONARY OETT 7.5 on 10/4>> A: Acute respiratory failure 2nd to HCAP. Hx of COPD. P:   Vent bundle Sputum culture O2 as needed BD's No need for steroids  CARDIOVASCULAR  Lt IJ cvl 10/4>> A: Septic shock. Hx of PE, CAD, HTN. P:  Sepsis protocol Check Trop I Hold antihypertensives Hold xarelto  RENAL A:  CKD P:   Monitor renal fx, urine outpt, electrolytes  GASTROINTESTINAL A:   Hx of cirrhosis. P:   F/u LFT NPO Tube feeds if unable to extubate soon Protonix  HEMATOLOGIC A:  Hx iron deficiency anemia. P:  CBC  INFECTIOUS A:   Septic shock with presumed HCAP. P:   BCx210/4>> UC  10/4>> Sputum 10/4>> Abx: Vanc, start date 10/4, day 0/x Abx: cefepime, start date10/4, day 0/x   ENDOCRINE A:   No acute issue at admit P:   Monitor blood sugar on BMET  NEUROLOGIC A:  Acute encephalopathy P:   RASS goal: 2 F/u CT head  Family updated:  No family  Interdisciplinary Family Meeting v Palliative Care Meeting:    TODAY'S SUMMARY:  58 yo WF discharged 9/22 from [place] to SNF with discharge dx of: CAP, HTN, Cirrhosis, CKD, COPD, PE, Tibacco abuse. She presents to [facility] ED 10/4 after being found on floor in SNF covered in feces, fever 104 and hypotensive. IN ED she has refractory hypotension despite levoiphed , 3 litres of IVF. PCCM asked to admit with diagnosis of sepsis . Note her CxR could have pna. We will start broad spectrum abx, place cvl and admit to ICU. Further note her respiratory status declined and she was intubated in  ED.    Richardson Landry Minor ACNP Maryanna Shape PCCM Pager [phone] till 3 pm If no answer page [phone] 10/26/2013, 10:48 AM   Reviewed above, and examined.  She has septic shock from presumed HCAP with VDRF.  Had recent hospital stay for PNA and PE.  Has hx of COPD, cirrhosis, and tobacco abuse.  Will need full vent support, pressors, and Abx for presumed HCAP.  CC time 60 minutes.  Chesley Mires, MD [facility] Pulmonary/Critical Care 10/26/2013, 1:24 PM Pager:  [phone] After 3pm call: [phone]

## 2013-10-26 NOTE — ED Notes (Signed)
80 mg succinylcholine

## 2013-10-27 ENCOUNTER — Inpatient Hospital Stay (HOSPITAL_COMMUNITY): Payer: Medicare Other

## 2013-10-27 DIAGNOSIS — J449 Chronic obstructive pulmonary disease, unspecified: Secondary | ICD-10-CM

## 2013-10-27 DIAGNOSIS — I2699 Other pulmonary embolism without acute cor pulmonale: Secondary | ICD-10-CM

## 2013-10-27 DIAGNOSIS — R6520 Severe sepsis without septic shock: Secondary | ICD-10-CM

## 2013-10-27 LAB — BLOOD GAS, ARTERIAL
ACID-BASE DEFICIT: 8.6 mmol/L — AB (ref 0.0–2.0)
Bicarbonate: 16.6 mEq/L — ABNORMAL LOW (ref 20.0–24.0)
DRAWN BY: 24513
FIO2: 40 %
LHR: 14 {breaths}/min
O2 SAT: 95 %
PEEP: 5 cmH2O
PO2 ART: 86.9 mmHg (ref 80.0–100.0)
Patient temperature: 100.8
TCO2: 17.7 mmol/L (ref 0–100)
VT: 550 mL
pCO2 arterial: 37.2 mmHg (ref 35.0–45.0)
pH, Arterial: 7.279 — ABNORMAL LOW (ref 7.350–7.450)

## 2013-10-27 LAB — BASIC METABOLIC PANEL
Anion gap: 17 — ABNORMAL HIGH (ref 5–15)
BUN: 21 mg/dL (ref 6–23)
CO2: 16 meq/L — AB (ref 19–32)
Calcium: 7.3 mg/dL — ABNORMAL LOW (ref 8.4–10.5)
Chloride: 101 mEq/L (ref 96–112)
Creatinine, Ser: 1.56 mg/dL — ABNORMAL HIGH (ref 0.50–1.10)
GFR calc Af Amer: 42 mL/min — ABNORMAL LOW (ref >90)
GFR calc non Af Amer: 36 mL/min — ABNORMAL LOW (ref >90)
GLUCOSE: 157 mg/dL — AB (ref 70–99)
POTASSIUM: 4.2 meq/L (ref 3.7–5.3)
Sodium: 134 mEq/L — ABNORMAL LOW (ref 137–147)

## 2013-10-27 LAB — URINE CULTURE
Colony Count: NO GROWTH
Culture: NO GROWTH

## 2013-10-27 LAB — DIC (DISSEMINATED INTRAVASCULAR COAGULATION) PANEL
INR: 3.5 — AB (ref 0.00–1.49)
PLATELETS: 43 10*3/uL — AB (ref 150–400)
Smear Review: NONE SEEN

## 2013-10-27 LAB — CBC
HCT: 32.9 % — ABNORMAL LOW (ref 36.0–46.0)
HEMOGLOBIN: 10.8 g/dL — AB (ref 12.0–15.0)
MCH: 30.9 pg (ref 26.0–34.0)
MCHC: 32.8 g/dL (ref 30.0–36.0)
MCV: 94.3 fL (ref 78.0–100.0)
Platelets: 47 10*3/uL — ABNORMAL LOW (ref 150–400)
RBC: 3.49 MIL/uL — AB (ref 3.87–5.11)
RDW: 14 % (ref 11.5–15.5)
WBC: 13.3 10*3/uL — ABNORMAL HIGH (ref 4.0–10.5)

## 2013-10-27 LAB — PROTIME-INR
INR: 3.43 — AB (ref 0.00–1.49)
PROTHROMBIN TIME: 34.6 s — AB (ref 11.6–15.2)

## 2013-10-27 LAB — DIC (DISSEMINATED INTRAVASCULAR COAGULATION)PANEL
D-Dimer, Quant: 4.13 ug/mL-FEU — ABNORMAL HIGH (ref 0.00–0.48)
Fibrinogen: 644 mg/dL — ABNORMAL HIGH (ref 204–475)
Prothrombin Time: 35.1 seconds — ABNORMAL HIGH (ref 11.6–15.2)
aPTT: 50 seconds — ABNORMAL HIGH (ref 24–37)

## 2013-10-27 LAB — APTT
APTT: 50 s — AB (ref 24–37)
aPTT: 66 seconds — ABNORMAL HIGH (ref 24–37)
aPTT: 59 seconds — ABNORMAL HIGH (ref 24–37)

## 2013-10-27 LAB — GLUCOSE, CAPILLARY: Glucose-Capillary: 168 mg/dL — ABNORMAL HIGH (ref 70–99)

## 2013-10-27 MED ORDER — VITAL HIGH PROTEIN PO LIQD
1000.0000 mL | ORAL | Status: DC
Start: 2013-10-27 — End: 2013-10-29
  Administered 2013-10-27: 1000 mL
  Filled 2013-10-27 (×5): qty 1000

## 2013-10-27 MED ORDER — PRO-STAT SUGAR FREE PO LIQD
30.0000 mL | Freq: Two times a day (BID) | ORAL | Status: AC
  Administered 2013-10-27 (×2): 30 mL via ORAL
  Filled 2013-10-27 (×2): qty 30

## 2013-10-27 MED ORDER — SODIUM PHOSPHATE 3 MMOLE/ML IV SOLN
40.0000 meq | Freq: Once | INTRAVENOUS | Status: AC
  Administered 2013-10-27: 40 meq via INTRAVENOUS
  Filled 2013-10-27: qty 10

## 2013-10-27 MED ORDER — ARGATROBAN 50 MG/50ML IV SOLN
0.3000 ug/kg/min | INTRAVENOUS | Status: DC
  Administered 2013-10-27 – 2013-10-28 (×2): 0.2 ug/kg/min via INTRAVENOUS
  Administered 2013-10-29: 0.3 ug/kg/min via INTRAVENOUS
  Filled 2013-10-27 (×3): qty 50

## 2013-10-27 MED ORDER — VITAL HIGH PROTEIN PO LIQD
1000.0000 mL | ORAL | Status: DC
  Filled 2013-10-27 (×2): qty 1000

## 2013-10-27 MED ORDER — MAGNESIUM SULFATE 40 MG/ML IJ SOLN
2.0000 g | Freq: Once | INTRAMUSCULAR | Status: AC
Start: 2013-10-27 — End: 2013-10-27
  Administered 2013-10-27: 2 g via INTRAVENOUS
  Filled 2013-10-27: qty 50

## 2013-10-27 NOTE — Progress Notes (Signed)
INITIAL NUTRITION ASSESSMENT  DOCUMENTATION CODES Per approved criteria  -Morbid Obesity   INTERVENTION:  Initiate TF via OGT with Vital High Protein at 25 ml/h and Prostat 30 ml BID on day 1; on day 2, D/C Prostat and increase to goal rate of 65 ml/h (1560 ml per day) to provide 1560 kcals (70% of estimated needs), 137 gm protein, 1304 ml free water daily.  NUTRITION DIAGNOSIS: Inadequate oral intake related to inability to eat as evidenced by NPO status.   Goal: Enteral nutrition to provide 60-70% of estimated calorie needs (22-25 kcals/kg ideal body weight) and 100% of estimated protein needs, based on ASPEN guidelines for permissive underfeeding in critically ill obese individuals  Monitor:  TF tolerance/adequacy, weight trend, labs, vent status.  Reason for Assessment: MD Consult for TF initiation and management.  58 y.o. female  Admitting Dx: Shock  ASSESSMENT: 58 yo WF discharged 9/22 from [place] to SNF with discharge dx of: CAP, HTN, Cirrhosis, CKD, COPD, PE, Tobacco abuse. She presents to [facility] ED 10/4 after being found on floor in SNF covered in feces, fever 104 and hypotensive. In ED she had refractory hypotension despite levophed, 3 litres of IVF, and CXR findings suggestive of PNA.   Nutrition focused physical exam completed.  No muscle or subcutaneous fat depletion noticed.  Patient is currently intubated on ventilator support MV: 8.6 L/min Temp (24hrs), Avg:97.3 F (36.3 C), Min:89.9 F (32.2 C), Max:103.1 F (39.5 C)  Propofol: none  Height: Ht Readings from Last 1 Encounters:  10/26/13 5\' 4"  (1.626 m)    Weight: Wt Readings from Last 1 Encounters:  10/27/13 235 lb 14.3 oz (107 kg)    Ideal Body Weight: 54.5 kg  % Ideal Body Weight: 196%  Wt Readings from Last 10 Encounters:  10/27/13 235 lb 14.3 oz (107 kg)  10/08/13 204 lb 5.9 oz (92.7 kg)  07/14/13 210 lb (95.255 kg)  12/24/12 228 lb 9.9 oz (103.7 kg)  11/28/10 255 lb 1.2 oz (115.7  kg)  09/01/08 202 lb (91.627 kg)    Usual Body Weight: 204 lb  % Usual Body Weight: 115%  BMI:  Body mass index is 40.47 kg/(m^2). class 3, extreme/morbid obesity  Estimated Nutritional Needs: Kcal: 2234 Protein: 109-136 Fluid: 2 L  Skin: knee abrasions  Diet Order: NPO  EDUCATION NEEDS: -Education not appropriate at this time   Intake/Output Summary (Last 24 hours) at 10/27/13 1141 Last data filed at 10/27/13 1024  Gross per 24 hour  Intake 8516.56 ml  Output   1490 ml  Net 7026.56 ml    Last BM: 10/4   Labs:   Recent Labs Lab 10/26/13 0913 10/26/13 1356 10/27/13 0335  NA 135*  --  134*  K 3.9  --  4.2  CL 98  --  101  CO2 25  --  16*  BUN 17  --  21  CREATININE 1.77*  --  1.56*  CALCIUM 8.6  --  7.3*  MG  --  1.4*  --   PHOS  --  1.2*  --   GLUCOSE 101*  --  157*    CBG (last 3)   Recent Labs  10/26/13 1315 10/26/13 1500  GLUCAP 118* 115*    Scheduled Meds: . antiseptic oral rinse  7 mL Mouth Rinse QID  . ceFEPime (MAXIPIME) IV  1 g Intravenous Q24H  . chlorhexidine  15 mL Mouth Rinse BID  . feeding supplement (VITAL HIGH PROTEIN)  1,000 mL Per Tube  Q24H  . hydrocortisone sod succinate (SOLU-CORTEF) inj  50 mg Intravenous Q6H  . levofloxacin (LEVAQUIN) IV  750 mg Intravenous Q24H  . magnesium sulfate 1 - 4 g bolus IVPB  2 g Intravenous Once  . pantoprazole (PROTONIX) IV  40 mg Intravenous QHS  . vancomycin  750 mg Intravenous Q12H    Continuous Infusions: . sodium chloride 1,000 mL (10/27/13 0626)  . fentaNYL infusion INTRAVENOUS 50 mcg/hr (10/27/13 0816)  . norepinephrine (LEVOPHED) Adult infusion 20 mcg/min (10/27/13 1024)  . vasopressin (PITRESSIN) infusion - *FOR SHOCK* 0.03 Units/min (10/27/13 1140)    Past Medical History  Diagnosis Date  . Hypertension   . COPD (chronic obstructive pulmonary disease)   . Renal insufficiency   . Cirrhosis   . Pulmonary emboli   . Pneumonia     Past Surgical History  Procedure  Laterality Date  . Abdominal hysterectomy      Molli Barrows, [place], LDN, [place] Pager [phone] After Hours Pager [phone]

## 2013-10-27 NOTE — Progress Notes (Signed)
*  PRELIMINARY RESULTS* Vascular Ultrasound Lower extremity venous duplex has been completed.  Preliminary findings: No DVT noted in right lower extremity. Subacute DVT noted in the left mid to distal femoral vein and left popliteal vein. This appears improved compared to previous study 10/08/13.  Landry Mellow, RDMS, RVT  10/27/2013, 11:50 AM

## 2013-10-27 NOTE — Progress Notes (Signed)
ANTICOAGULATION CONSULT NOTE - Initial Consult  Pharmacy Consult for Argatroban Indication: pulmonary embolus (10/06/13); r/o HIT  Allergies  Allergen Reactions  . Duloxetine Other (See Comments)    REACTION: Headache  . Gabapentin Other (See Comments)    REACTION: rash in mouth  . Neomycin-Bacitracin Zn-Polymyx Other (See Comments)    REACTION: rash/hives    Patient Measurements: Height: 5\' 4"  (162.6 cm) Weight: 235 lb 14.3 oz (107 kg) IBW/kg (Calculated) : 54.7  Vital Signs: Temp: 97.8 F (36.6 C) (10/05 0819) Temp Source: Oral (10/05 0819) BP: 100/71 mmHg (10/05 0900) Pulse Rate: 88 (10/05 0900)  Labs:  Recent Labs  10/26/13 0913 10/26/13 1356 10/27/13 0335  HGB 11.4*  --  10.8*  HCT 34.3*  --  32.9*  PLT 67*  --  47*  APTT  --  52*  --   LABPROT  --  39.3*  --   INR  --  4.04*  --   CREATININE 1.77*  --  1.56*    Estimated Creatinine Clearance: 48.1 ml/min (by C-G formula based on Cr of 1.56).   Medical History: Past Medical History  Diagnosis Date  . Hypertension   . COPD (chronic obstructive pulmonary disease)   . Renal insufficiency   . Cirrhosis   . Pulmonary emboli   . Pneumonia     Medications:  Scheduled:  . antiseptic oral rinse  7 mL Mouth Rinse QID  . ceFEPime (MAXIPIME) IV  1 g Intravenous Q24H  . chlorhexidine  15 mL Mouth Rinse BID  . hydrocortisone sod succinate (SOLU-CORTEF) inj  50 mg Intravenous Q6H  . levofloxacin (LEVAQUIN) IV  750 mg Intravenous Q24H  . pantoprazole (PROTONIX) IV  40 mg Intravenous QHS  . vancomycin  750 mg Intravenous Q12H    Assessment: 58 y.o. female with recent hospitalization and recent PE 10/08/13 - was on Xarelto PTA BID dosing - last dose 10/3. Pt now with decreasing platelets. Noted that she received heparin at last admission so HIT is a possibility. HIT panel pending. To begin Argatroban for full-dose anticoagulation pending HIT panel results. Baseline INR 3.43  and aPTT 50 (which is at low end of  therapeutic range on its own).  Goal of Therapy:  aPTT 50-90 seconds Monitor platelets by anticoagulation protocol: Yes   Plan:  1) Argatroban 0.2 mcg/kg/min (1.3 ml/hr) 2) aPTT 2 hours post gtt start 3) Daily aPTT and CBC  Sherlon Handing, PharmD, BCPS Clinical pharmacist, pager [phone] 10/27/2013,10:24 AM

## 2013-10-27 NOTE — Progress Notes (Addendum)
[place] for Argatroban Indication: pulmonary embolus (10/06/13); r/o HIT  Allergies  Allergen Reactions  . Duloxetine Other (See Comments)    REACTION: Headache  . Gabapentin Other (See Comments)    REACTION: rash in mouth  . Neomycin-Bacitracin Zn-Polymyx Other (See Comments)    REACTION: rash/hives    Patient Measurements: Height: 5\' 4"  (162.6 cm) Weight: 235 lb 14.3 oz (107 kg) IBW/kg (Calculated) : 54.7  Vital Signs: Temp: 97.4 F (36.3 C) (10/05 1702) Temp Source: Oral (10/05 1702) BP: 87/66 mmHg (10/05 1702) Pulse Rate: 85 (10/05 1702)  Labs:  Recent Labs  10/26/13 0913 10/26/13 1356 10/27/13 0335 10/27/13 1130 10/27/13 1615  HGB 11.4*  --  10.8*  --   --   HCT 34.3*  --  32.9*  --   --   PLT 67*  --  47* 43*  --   APTT  --  52*  --  50*  50* 66*  LABPROT  --  39.3*  --  35.1*  34.6*  --   INR  --  4.04*  --  3.50*  3.43*  --   CREATININE 1.77*  --  1.56*  --   --     Estimated Creatinine Clearance: 48.1 ml/min (by C-G formula based on Cr of 1.56).   Medical History: Past Medical History  Diagnosis Date  . Hypertension   . COPD (chronic obstructive pulmonary disease)   . Renal insufficiency   . Cirrhosis   . Pulmonary emboli   . Pneumonia     Medications:  Scheduled:  . antiseptic oral rinse  7 mL Mouth Rinse QID  . ceFEPime (MAXIPIME) IV  1 g Intravenous Q24H  . chlorhexidine  15 mL Mouth Rinse BID  . feeding supplement (PRO-STAT SUGAR FREE 64)  30 mL Oral BID  . hydrocortisone sod succinate (SOLU-CORTEF) inj  50 mg Intravenous Q6H  . levofloxacin (LEVAQUIN) IV  750 mg Intravenous Q24H  . pantoprazole (PROTONIX) IV  40 mg Intravenous QHS  . sodium phosphate  Dextrose 5% IVPB  40 mEq Intravenous Once  . vancomycin  750 mg Intravenous Q12H   . sodium chloride 1,000 mL (10/27/13 0626)  . argatroban 0.2 mcg/kg/min (10/27/13 1350)  . feeding supplement (VITAL HIGH PROTEIN) 1,000 mL (10/27/13 1508)  .  fentaNYL infusion INTRAVENOUS 100 mcg/hr (10/27/13 1500)  . norepinephrine (LEVOPHED) Adult infusion 14 mcg/min (10/27/13 1530)  . vasopressin (PITRESSIN) infusion - *FOR SHOCK* 0.03 Units/min (10/27/13 1140)     Assessment: 58 y.o. female with recent hospitalization and recent PE 10/08/13 - was on Xarelto PTA BID dosing - last dose 10/3. Pt now with decreasing platelets. Noted that she received heparin at last admission so HIT is a possibility. HIT panel pending. To begin Argatroban for full-dose anticoagulation pending HIT panel results. Baseline INR 3.43  and aPTT 50 (which is at low end of therapeutic range on its own).  Initial PTT 66 on argatroban 0.2 mcg/kg/min.  No bleeding or complications noted.  Goal of Therapy:  aPTT 50-90 seconds Monitor platelets by anticoagulation protocol: Yes   Plan:  1) Continue Argatroban 0.2 mcg/kg/min (1.3 ml/hr) 2) aPTT in 2 hrs to confirm. 3) Daily aPTT and CBC  Uvaldo Rising, BCPS  Clinical Pharmacist Pager [phone]  10/27/2013 5:23 PM    Addendum: Repeat heparin level remains in therapeutic range (59).  Will continue argatroban at the same rate.  F/u AM PTT.  [place] Crane, Pharm D, BCPS  Clinical Pharmacist Pager [phone]  10/27/2013 7:57 PM

## 2013-10-27 NOTE — Progress Notes (Signed)
Utilization review completed. Osmel Dykstra, RN, BSN. 

## 2013-10-27 NOTE — Progress Notes (Signed)
PULMONARY / CRITICAL CARE MEDICINE   Name: GALYA DUNNIGAN MRN: [id number] DOB: August 30, 1955    ADMISSION DATE:  10/26/2013   REFERRING MD :  EDP  CHIEF COMPLAINT:  Shock  INITIAL PRESENTATION: 58 yo female smoker from SNF with fever, altered mental status, respiratory failure from PNA with septic shock.  She had recent hospital stay for PNA and PE.  She has hx of HTN, cirrhosis, CKD, COPD.  STUDIES:  10/4 CT head >> negative  SIGNIFICANT EVENTS: 10/4 found down in shock, VDRF   HISTORY OF PRESENT ILLNESS:   58 yo WF discharged 9/22 from [facility] to SNF with discharge dx of: CAP, HTN, Cirrhosis, CKD, COPD, PE, Tobacco abuse. She presents to [facility] ED 10/4 after being found on floor in SNF covered in feces, fever 104 and hypotensive. In ED she had refractory hypotension despite levophed, 3 litres of IVF, and CXR findings suggestive of PNA. PCCM asked to admit with diagnosis of sepsis .   SUBJECTIVE: Pt sedated; required additional 1L bolus overnight.   VITAL SIGNS: Temp:  [89.9 F (32.2 C)-103.7 F (39.8 C)] 97.8 F (36.6 C) (10/05 0353) Pulse Rate:  [86-127] 86 (10/05 0700) Resp:  [12-38] 17 (10/05 0700) BP: (67-127)/(34-91) 110/70 mmHg (10/05 0700) SpO2:  [91 %-100 %] 96 % (10/05 0700) Arterial Line BP: (86-138)/(35-67) 117/62 mmHg (10/05 0700) FiO2 (%):  [40 %-50 %] 40 % (10/05 0400) Weight:  [235 lb 10.8 oz (106.9 kg)-235 lb 14.3 oz (107 kg)] 235 lb 14.3 oz (107 kg) (10/05 0324) HEMODYNAMICS: CVP:  [7 mmHg-19 mmHg] 12 mmHg VENTILATOR SETTINGS: Vent Mode:  [-] PRVC FiO2 (%):  [40 %-50 %] 40 % Set Rate:  [14 bmp] 14 bmp Vt Set:  [550 mL] 550 mL PEEP:  [5 cmH20] 5 cmH20 Plateau Pressure:  [17 cmH20-23 cmH20] 17 cmH20 INTAKE / OUTPUT:  Intake/Output Summary (Last 24 hours) at 10/27/13 0748 Last data filed at 10/27/13 0600  Gross per 24 hour  Intake 8372.89 ml  Output   1220 ml  Net 7152.89 ml    PHYSICAL EXAMINATION: General: Obese 58 y.o. female sedated on  vent Neuro: Sedated HEENT: NCAT, PERRL, anicteric sclerae Cardiovascular:  RRR no murmur or JVD Lungs: Rhonchorous over right anterior chest Abdomen:  Obese, +BS x4 Musculoskeletal: No gross deformities Skin:  Abrasions on bilateral knees  LABS:  CBC  Recent Labs Lab 10/26/13 0913 10/27/13 0335  WBC 7.3 13.3*  HGB 11.4* 10.8*  HCT 34.3* 32.9*  PLT 67* 47*   Coag's  Recent Labs Lab 10/26/13 1356  APTT 52*  INR 4.04*    BMET  Recent Labs Lab 10/26/13 0913 10/27/13 0335  NA 135* 134*  K 3.9 4.2  CL 98 101  CO2 25 16*  BUN 17 21  CREATININE 1.77* 1.56*  GLUCOSE 101* 157*   Electrolytes  Recent Labs Lab 10/26/13 0913 10/26/13 1356 10/27/13 0335  CALCIUM 8.6  --  7.3*  MG  --  1.4*  --   PHOS  --  1.2*  --    Sepsis Markers  Recent Labs Lab 10/26/13 0915 10/26/13 1356 10/26/13 1357  LATICACIDVEN 3.75*  --  2.8*  PROCALCITON  --  43.20  --    ABG  Recent Labs Lab 10/26/13 1322 10/27/13 0341  PHART 7.374 7.279*  PCO2ART 37.9 37.2  PO2ART 267.0* 86.9    Liver Enzymes  Recent Labs Lab 10/26/13 0913  AST 36  ALT 58*  ALKPHOS 98  BILITOT 1.0  ALBUMIN  2.7*   Cardiac Enzymes No results found for this basename: TROPONINI, PROBNP,  in the last 168 hours  Glucose  Recent Labs Lab 10/26/13 1315 10/26/13 1500  GLUCAP 118* 115*    Imaging Ct Head Wo Contrast  10/26/2013   CLINICAL DATA:  Altered mental status, fever, head and neck pain, Initial visit  EXAM: CT HEAD WITHOUT CONTRAST  CT CERVICAL SPINE WITHOUT CONTRAST  TECHNIQUE: Multidetector CT imaging of the head and cervical spine was performed following the standard protocol without intravenous contrast. Multiplanar CT image reconstructions of the cervical spine were also generated.  COMPARISON:  05/21/2011, 04/23/2011  FINDINGS: CT HEAD FINDINGS  Mild diffuse cortical atrophy stable from prior study. No evidence of vascular territory infarct, hemorrhage, or extra-axial fluid. No  evidence of mass or hydrocephalus. Calvarium is intact.  CT CERVICAL SPINE FINDINGS  Endotracheal tube and orogastric tube identified. Significant right upper lobe consolidation partially visualized consistent with known right upper lobe pneumonia.  Cervical spine demonstrates normal alignment with no prevertebral soft tissue swelling and no fracture. Mild C5-6 and C6-7 degenerative disc disease.  IMPRESSION: No acute intracranial abnormality. No acute findings involving the cervical spine. Extensive right upper lobe consolidation consistent with known diagnosis of pneumonia.   Electronically Signed   By: Skipper Cliche M.D.   On: 10/26/2013 18:35   Ct Cervical Spine Wo Contrast  10/26/2013   CLINICAL DATA:  Altered mental status, fever, head and neck pain, Initial visit  EXAM: CT HEAD WITHOUT CONTRAST  CT CERVICAL SPINE WITHOUT CONTRAST  TECHNIQUE: Multidetector CT imaging of the head and cervical spine was performed following the standard protocol without intravenous contrast. Multiplanar CT image reconstructions of the cervical spine were also generated.  COMPARISON:  05/21/2011, 04/23/2011  FINDINGS: CT HEAD FINDINGS  Mild diffuse cortical atrophy stable from prior study. No evidence of vascular territory infarct, hemorrhage, or extra-axial fluid. No evidence of mass or hydrocephalus. Calvarium is intact.  CT CERVICAL SPINE FINDINGS  Endotracheal tube and orogastric tube identified. Significant right upper lobe consolidation partially visualized consistent with known right upper lobe pneumonia.  Cervical spine demonstrates normal alignment with no prevertebral soft tissue swelling and no fracture. Mild C5-6 and C6-7 degenerative disc disease.  IMPRESSION: No acute intracranial abnormality. No acute findings involving the cervical spine. Extensive right upper lobe consolidation consistent with known diagnosis of pneumonia.   Electronically Signed   By: Skipper Cliche M.D.   On: 10/26/2013 18:35   Dg Chest  Portable 1 View  10/26/2013   CLINICAL DATA:  Post intubation, [facility] 10/26/2013, fever of 104 degrees, hypotensive, followup pneumonia  EXAM: PORTABLE CHEST - 1 VIEW  COMPARISON:  10/26/2013  FINDINGS: Endotracheal tube tip is 2.2 cm above the carina. No change in left central line.  Right upper lobe infiltrate stable. Areas of discoid atelectasis left mid to lower lung zones similar to prior study. Heart size normal.  IMPRESSION: Endotracheal tube as described. Stable right upper lobe opacity suggesting pneumonia.   Electronically Signed   By: Skipper Cliche M.D.   On: 10/26/2013 12:47   Dg Chest Portable 1 View  10/26/2013   CLINICAL DATA:  Central line placement subsequent encounter.  EXAM: PORTABLE CHEST - 1 VIEW  COMPARISON:  10/26/2013; 10/10/2013  FINDINGS: Grossly unchanged borderline enlarged cardiac silhouette and mediastinal contours given persistently reduced lung volumes and patient rotation. Interval placement of a left jugular approach intravenous catheter with tip projected over the superior SVC.  No pneumothorax. Heterogeneous and consolidative opacities within the right upper long have worsened in the interval. Grossly unchanged bilateral medial basilar heterogeneous opacities, left greater than right. No pleural effusion. Pulmonary venous congestion without frank evidence of edema. Unchanged bones.  IMPRESSION: 1. Interval placement of left jugular approach central venous catheter with tip projected over the superior cavoatrial junction. No pneumothorax. 2. Worsening right upper lobe heterogeneous/consolidative opacities worrisome for infection. 3. Unchanged bilateral medial basilar heterogeneous opacities, atelectasis versus infiltrate.   Electronically Signed   By: Sandi Mariscal M.D.   On: 10/26/2013 11:36   Dg Chest Port 1 View  10/26/2013   CLINICAL DATA:  Altered mental status. Increased shortness of breath. Decreased oxygen saturation. History of  COPD and pneumonia. Initial encounter.  EXAM: PORTABLE CHEST - 1 VIEW  COMPARISON:  10/10/2013; 10/04/2013; chest CT - 10/08/2013  FINDINGS: The examination is degraded due to patient body habitus, portable technique and hypoventilation.  Grossly unchanged enlarged cardiac silhouette and mediastinal contours given kyphotic projection. Grossly unchanged mild tortuosity and ectasia of the thoracic aorta. Lung volumes remain reduced with worsening bibasilar heterogeneous opacities. Worsening heterogeneous opacities with the right upper lung. No pleural effusion or pneumothorax. No evidence of edema. No acute osseus abnormalities.  IMPRESSION: Right upper and bilateral lower lung heterogeneous airspace opacities worrisome for multifocal infection on this hypoventilated, portable and kyphotic examination. A follow-up chest radiograph in 4 to 6 weeks after treatment is recommended to ensure resolution.   Electronically Signed   By: Sandi Mariscal M.D.   On: 10/26/2013 08:58    ASSESSMENT / PLAN:  PULMONARY OETT 7.5 on 10/4>> A: Acute respiratory failure 2nd to HCAP Hx of COPD P:   Vent bundle Continue BD's Cont abx as below Consider CT chest to assess for RUL infarct or abscess  CARDIOVASCULAR  Lt IJ cvl 10/4>> A: Septic shock. Hx of PE, CAD, HTN. P:  On levophed 22 and vasopressin; CVP goal > 10 Hold antihypertensives Hold xarelto  RENAL A:  CKD-III P:   Monitor renal fx, urine outpt Replete electrolytes prn  GASTROINTESTINAL A:   Hep B with cirrhosis. MELD = 28  R abdominal pain 10/5 P:   F/u LFT NPO Tube feeds if unable by tomorrow Protonix for SUP RUQ [place] vs CT abd in abd pain continues  HEMATOLOGIC A:  Hx iron deficiency anemia. Hx xarelto for PE and INR >4 Thrombocytopenia  P:  CBC Monitor coags Holding xarelto Start argatroban  Check LE doppler - consider IVC filter if high LE clot burden Check HITT panel and DIC panel SCDs  INFECTIOUS A:   Septic shock 2nd to  HCAP. P:   BC x2 10/4>> UC  10/4>> Sputum 10/4>> Viral panel>> Lactate >> 3.75 >> 2.8 Abx: Vanc, start date 10/4; day 2/x Abx: cefepime, start date 10/4; day 2/x Abx: levaquin, start date 10/4; day 2/x  ENDOCRINE A:   No acute issues P:   Monitor blood sugar on BMET Cortisol 10/4 >> 22.4 Stress steroids  NEUROLOGIC A:  Acute encephalopathy P:   RASS goal: -1 CT head >> negative   TODAY'S SUMMARY:  58 yo WF discharged 9/22 with discharge dx of: CAP, HTN, Cirrhosis, CKD, COPD, PE, tobacco abuse. Presented septic 10/4.  Ryan B. Bonner Puna, MD, PGY-2 10/27/2013 8:22 AM  45 minutes CC time  Baltazar Apo, MD, PhD 10/27/2013, 10:23 AM [facility] [phone] or if no answer [phone]

## 2013-10-28 ENCOUNTER — Inpatient Hospital Stay (HOSPITAL_COMMUNITY): Payer: Medicare Other

## 2013-10-28 DIAGNOSIS — J189 Pneumonia, unspecified organism: Secondary | ICD-10-CM

## 2013-10-28 LAB — COMPREHENSIVE METABOLIC PANEL
ALT: 43 U/L — ABNORMAL HIGH (ref 0–35)
AST: 44 U/L — AB (ref 0–37)
Albumin: 1.9 g/dL — ABNORMAL LOW (ref 3.5–5.2)
Alkaline Phosphatase: 64 U/L (ref 39–117)
Anion gap: 10 (ref 5–15)
BILIRUBIN TOTAL: 0.3 mg/dL (ref 0.3–1.2)
BUN: 27 mg/dL — AB (ref 6–23)
CO2: 21 meq/L (ref 19–32)
Calcium: 7.8 mg/dL — ABNORMAL LOW (ref 8.4–10.5)
Chloride: 105 mEq/L (ref 96–112)
Creatinine, Ser: 1.01 mg/dL (ref 0.50–1.10)
GFR calc Af Amer: 71 mL/min — ABNORMAL LOW (ref >90)
GFR, EST NON AFRICAN AMERICAN: 61 mL/min — AB (ref >90)
Glucose, Bld: 148 mg/dL — ABNORMAL HIGH (ref 70–99)
Potassium: 4.2 mEq/L (ref 3.7–5.3)
Sodium: 136 mEq/L — ABNORMAL LOW (ref 137–147)
Total Protein: 5.6 g/dL — ABNORMAL LOW (ref 6.0–8.3)

## 2013-10-28 LAB — CBC WITH DIFFERENTIAL/PLATELET
BASOS PCT: 0 % (ref 0–1)
Basophils Absolute: 0 10*3/uL (ref 0.0–0.1)
EOS ABS: 0 10*3/uL (ref 0.0–0.7)
Eosinophils Relative: 0 % (ref 0–5)
HEMATOCRIT: 30.2 % — AB (ref 36.0–46.0)
HEMOGLOBIN: 9.8 g/dL — AB (ref 12.0–15.0)
Lymphocytes Relative: 7 % — ABNORMAL LOW (ref 12–46)
Lymphs Abs: 0.3 10*3/uL — ABNORMAL LOW (ref 0.7–4.0)
MCH: 30.4 pg (ref 26.0–34.0)
MCHC: 32.5 g/dL (ref 30.0–36.0)
MCV: 93.8 fL (ref 78.0–100.0)
MONO ABS: 0.2 10*3/uL (ref 0.1–1.0)
MONOS PCT: 4 % (ref 3–12)
NEUTROS PCT: 89 % — AB (ref 43–77)
Neutro Abs: 3.7 10*3/uL (ref 1.7–7.7)
Platelets: 47 10*3/uL — ABNORMAL LOW (ref 150–400)
RBC: 3.22 MIL/uL — ABNORMAL LOW (ref 3.87–5.11)
RDW: 14.6 % (ref 11.5–15.5)
WBC: 4.2 10*3/uL (ref 4.0–10.5)

## 2013-10-28 LAB — PROTIME-INR
INR: 3.46 — ABNORMAL HIGH (ref 0.00–1.49)
PROTHROMBIN TIME: 34.8 s — AB (ref 11.6–15.2)

## 2013-10-28 LAB — CBC
HEMATOCRIT: 29 % — AB (ref 36.0–46.0)
Hemoglobin: 9.6 g/dL — ABNORMAL LOW (ref 12.0–15.0)
MCH: 30.4 pg (ref 26.0–34.0)
MCHC: 33.1 g/dL (ref 30.0–36.0)
MCV: 91.8 fL (ref 78.0–100.0)
Platelets: 43 10*3/uL — ABNORMAL LOW (ref 150–400)
RBC: 3.16 MIL/uL — AB (ref 3.87–5.11)
RDW: 14.4 % (ref 11.5–15.5)
WBC: 4.9 10*3/uL (ref 4.0–10.5)

## 2013-10-28 LAB — GLUCOSE, CAPILLARY
GLUCOSE-CAPILLARY: 134 mg/dL — AB (ref 70–99)
GLUCOSE-CAPILLARY: 137 mg/dL — AB (ref 70–99)
GLUCOSE-CAPILLARY: 145 mg/dL — AB (ref 70–99)
Glucose-Capillary: 118 mg/dL — ABNORMAL HIGH (ref 70–99)
Glucose-Capillary: 127 mg/dL — ABNORMAL HIGH (ref 70–99)

## 2013-10-28 LAB — APTT
APTT: 73 s — AB (ref 24–37)
aPTT: 80 seconds — ABNORMAL HIGH (ref 24–37)

## 2013-10-28 LAB — MAGNESIUM: MAGNESIUM: 1.9 mg/dL (ref 1.5–2.5)

## 2013-10-28 LAB — PHOSPHORUS: Phosphorus: 3.2 mg/dL (ref 2.3–4.6)

## 2013-10-28 MED ORDER — DOCUSATE SODIUM 50 MG/5ML PO LIQD
50.0000 mg | Freq: Every day | ORAL | Status: DC | PRN
  Filled 2013-10-28: qty 10

## 2013-10-28 MED ORDER — PHYTONADIONE 5 MG PO TABS
5.0000 mg | ORAL_TABLET | Freq: Once | ORAL | Status: AC
  Administered 2013-10-28: 5 mg via ORAL
  Filled 2013-10-28: qty 1

## 2013-10-28 MED ORDER — PANTOPRAZOLE SODIUM 40 MG PO PACK
40.0000 mg | PACK | Freq: Every day | ORAL | Status: DC
  Administered 2013-10-28 – 2013-10-29 (×2): 40 mg
  Filled 2013-10-28 (×3): qty 20

## 2013-10-28 MED ORDER — SODIUM CHLORIDE 0.9 % IV SOLN
1000.0000 mL | INTRAVENOUS | Status: DC
  Administered 2013-10-30: 500 mL via INTRAVENOUS

## 2013-10-28 MED ORDER — BISACODYL 10 MG RE SUPP: 10.0000 mg | Freq: Every day | RECTAL | Status: DC | PRN

## 2013-10-28 NOTE — Clinical Documentation Improvement (Signed)
Abnormal findings (laboratory, x-ray, MRI/CT scans, and other diagnostic results) are not coded and reported unless the physician indicates their clinical significance. The medical record reflects the following clinical findings. If possible, please help by clarifying the diagnostic and/or clinical significance of these abnormal findings. Thank you!  Abnormal Lab and/or Testing Results: Component      Lactic Acid, Venous  Latest Ref Rng      0.5 - 2.2 mmol/L  10/26/2013     9:15 AM 3.75 (H)  10/26/2013     1:57 PM 2.8 (H)   Component      pH, Arterial pCO2 arterial pO2, Arterial  Latest Ref Rng      7.350 - 7.450 35.0 - 45.0 mmHg 80.0 - 100.0 mmHg  10/27/2013     3:41 AM 7.279 (L) 37.2 86.9   Component      Bicarbonate  Latest Ref Rng      20.0 - 24.0 mEq/L  10/27/2013     3:41 AM 16.6 (L)   Treatment provided: - Intubated on 10/26/13  Possible Clinical Conditions: - Acute Respiratory Acidosis - Lactic Acidoses - Both Acute Respiratory Acidosis an Lactic Acidosis - Not clinically significant   Thank you for your time with this!   Almon Register, RN Clinical Documentation Improvement Specialist ([phone] / [phone]

## 2013-10-28 NOTE — Progress Notes (Signed)
ANTICOAGULATION CONSULT NOTE - Follow Up Consult  Pharmacy Consult for Argatroban Indication: pulmonary embolus; r/o HIT  Allergies  Allergen Reactions  . Duloxetine Other (See Comments)    REACTION: Headache  . Gabapentin Other (See Comments)    REACTION: rash in mouth  . Neomycin-Bacitracin Zn-Polymyx Other (See Comments)    REACTION: rash/hives    Patient Measurements: Height: 5\' 4"  (162.6 cm) Weight: 250 lb 14.1 oz (113.8 kg) IBW/kg (Calculated) : 54.7  Vital Signs: Temp: 97.3 F (36.3 C) (10/06 1214) Temp Source: Oral (10/06 1214) BP: 83/63 mmHg (10/06 1200) Pulse Rate: 89 (10/06 1200)  Labs:  Recent Labs  10/26/13 0913  10/26/13 1356 10/27/13 0335 10/27/13 1130  10/27/13 1850 10/28/13 0424 10/28/13 1150  HGB 11.4*  --   --  10.8*  --   --   --  9.6*  --   HCT 34.3*  --   --  32.9*  --   --   --  29.0*  --   PLT 67*  --   --  47* 43*  --   --  43*  --   APTT  --   < > 52*  --  50*  50*  < > 59* 80* 73*  LABPROT  --   --  39.3*  --  35.1*  34.6*  --   --   --  34.8*  INR  --   --  4.04*  --  3.50*  3.43*  --   --   --  3.46*  CREATININE 1.77*  --   --  1.56*  --   --   --  1.01  --   < > = values in this interval not displayed.  Estimated Creatinine Clearance: 76.9 ml/min (by C-G formula based on Cr of 1.01).   Medications:  Scheduled:  . antiseptic oral rinse  7 mL Mouth Rinse QID  . ceFEPime (MAXIPIME) IV  1 g Intravenous Q24H  . chlorhexidine  15 mL Mouth Rinse BID  . hydrocortisone sod succinate (SOLU-CORTEF) inj  50 mg Intravenous Q6H  . levofloxacin (LEVAQUIN) IV  750 mg Intravenous Q24H  . pantoprazole sodium  40 mg Per Tube QHS    Assessment: 58 yo f admitted on 10/4 with recent hospitalization for PE 10/08/13.  Patient was on Xarelto BID PTA, last dose 10/25/13.  Patient now with decreasing platelets and noted to have received heparin last admission.  Pharmacy is consulted to dose and adjust argatroban for full-dose anticoagulation.  HIT  panel pending.  Baseline INR 3.43 and aPTT 50.  AM aPTT level was in range at 80.  Repeat level around noon was also within range at 73.  Will continue current infusion at 0.2 mcg/kg/min and obtain daily aPTT/CBC while on argatroban.   Goal of Therapy:  aPTT 50-90 seconds Monitor platelets by anticoagulation protocol: Yes   Plan:  Continue argatroban 0.2 mcg/kg/min (1.3 ml/hr) Daily aPTT and CBC  Cassie L. Nicole Kindred, PharmD Clinical Pharmacy Resident Pager: [phone] 10/28/2013 1:39 PM

## 2013-10-28 NOTE — Progress Notes (Signed)
PULMONARY / CRITICAL CARE MEDICINE   Name: [name] MRN: [id number] DOB: April 14, 1955    ADMISSION DATE:  10/26/2013   REFERRING MD :  EDP  CHIEF COMPLAINT:  Shock  INITIAL PRESENTATION: 58 yo female smoker from SNF with fever, altered mental status, respiratory failure from PNA with septic shock.  She had recent hospital stay for PNA and PE.  She has hx of HTN, cirrhosis, CKD, COPD.  STUDIES:  10/4 CT head >> negative 10/4 Blood culture >> ?GNRs 10/5 LE doppler >> subacute DVT in left femoral and popliteal vein   SIGNIFICANT EVENTS: 10/4 found down in shock, VDRF   HISTORY OF PRESENT ILLNESS:   58 yo WF discharged 9/22 from [place] to SNF with discharge dx of: CAP, HTN, Cirrhosis, CKD, COPD, PE, tobacco abuse. She presents to [facility] ED 10/4 after being found on floor in SNF covered in feces, fever 104 and hypotensive. In ED she had refractory hypotension despite levophed, 3 litres of IVF, and CXR findings suggestive of PNA. PCCM asked to admit with diagnosis of sepsis.   SUBJECTIVE: Pt sedated; no family at bedside this AM Argatroban started 10/5 Off pressors  VITAL SIGNS: Temp:  [97.4 F (36.3 C)-98.2 F (36.8 C)] 97.7 F (36.5 C) (10/06 0437) Pulse Rate:  [78-91] 84 (10/06 0500) Resp:  [0-19] 14 (10/06 0600) BP: (83-117)/(60-74) 83/60 mmHg (10/06 0600) SpO2:  [94 %-99 %] 98 % (10/06 0500) Arterial Line BP: (97-125)/(61-75) 101/66 mmHg (10/06 0600) FiO2 (%):  [40 %] 40 % (10/06 0400) Weight:  [250 lb 14.1 oz (113.8 kg)] 250 lb 14.1 oz (113.8 kg) (10/06 0351) HEMODYNAMICS: CVP:  [10 mmHg-13 mmHg] 12 mmHg VENTILATOR SETTINGS: Vent Mode:  [-] PRVC FiO2 (%):  [40 %] 40 % Set Rate:  [14 bmp] 14 bmp Vt Set:  [550 mL-560 mL] 550 mL PEEP:  [5 cmH20] 5 cmH20 Plateau Pressure:  [9 IWP80-99 cmH20] 15 cmH20 INTAKE / OUTPUT:  Intake/Output Summary (Last 24 hours) at 10/28/13 0658 Last data filed at 10/28/13 0600  Gross per 24 hour  Intake 2606.97 ml  Output    1015 ml  Net 1591.97 ml    PHYSICAL EXAMINATION: General: Obese 58 y.o. female sedated on vent Neuro: Sedated; RASS -1 HEENT: NCAT, PERRL, anicteric sclerae Cardiovascular:  RRR no murmur or JVD. 1+ pitting edema Lungs: Rhonchorous over right anterior chest Abdomen:  Obese, +BS x4 Musculoskeletal: No gross deformities  LABS:  CBC  Recent Labs Lab 10/26/13 0913 10/27/13 0335 10/27/13 1130 10/28/13 0424  WBC 7.3 13.3*  --  4.9  HGB 11.4* 10.8*  --  9.6*  HCT 34.3* 32.9*  --  29.0*  PLT 67* 47* 43* 43*   Coag's  Recent Labs Lab 10/26/13 1356 10/27/13 1130 10/27/13 1615 10/27/13 1850 10/28/13 0424  APTT 52* 50*  50* 66* 59* 80*  INR 4.04* 3.50*  3.43*  --   --   --     BMET  Recent Labs Lab 10/26/13 0913 10/27/13 0335 10/28/13 0424  NA 135* 134* 136*  K 3.9 4.2 4.2  CL 98 101 105  CO2 25 16* 21  BUN 17 21 27*  CREATININE 1.77* 1.56* 1.01  GLUCOSE 101* 157* 148*   Electrolytes  Recent Labs Lab 10/26/13 0913 10/26/13 1356 10/27/13 0335 10/28/13 0424  CALCIUM 8.6  --  7.3* 7.8*  MG  --  1.4*  --  1.9  PHOS  --  1.2*  --  3.2   Sepsis Markers  Recent Labs Lab 10/26/13 0915 10/26/13 1356 10/26/13 1357  LATICACIDVEN 3.75*  --  2.8*  PROCALCITON  --  43.20  --    ABG  Recent Labs Lab 10/26/13 1322 10/27/13 0341  PHART 7.374 7.279*  PCO2ART 37.9 37.2  PO2ART 267.0* 86.9    Liver Enzymes  Recent Labs Lab 10/26/13 0913 10/28/13 0424  AST 36 44*  ALT 58* 43*  ALKPHOS 98 64  BILITOT 1.0 0.3  ALBUMIN 2.7* 1.9*   Cardiac Enzymes No results found for this basename: TROPONINI, PROBNP,  in the last 168 hours  Glucose  Recent Labs Lab 10/26/13 1315 10/26/13 1500 10/27/13 2002 10/27/13 2351 10/28/13 0437  GLUCAP 118* 115* 168* 134* 145*    Imaging Ct Head Wo Contrast  10/26/2013   CLINICAL DATA:  Altered mental status, fever, head and neck pain, Initial visit  EXAM: CT HEAD WITHOUT CONTRAST  CT CERVICAL SPINE WITHOUT  CONTRAST  TECHNIQUE: Multidetector CT imaging of the head and cervical spine was performed following the standard protocol without intravenous contrast. Multiplanar CT image reconstructions of the cervical spine were also generated.  COMPARISON:  05/21/2011, 04/23/2011  FINDINGS: CT HEAD FINDINGS  Mild diffuse cortical atrophy stable from prior study. No evidence of vascular territory infarct, hemorrhage, or extra-axial fluid. No evidence of mass or hydrocephalus. Calvarium is intact.  CT CERVICAL SPINE FINDINGS  Endotracheal tube and orogastric tube identified. Significant right upper lobe consolidation partially visualized consistent with known right upper lobe pneumonia.  Cervical spine demonstrates normal alignment with no prevertebral soft tissue swelling and no fracture. Mild C5-6 and C6-7 degenerative disc disease.  IMPRESSION: No acute intracranial abnormality. No acute findings involving the cervical spine. Extensive right upper lobe consolidation consistent with known diagnosis of pneumonia.   Electronically Signed   By: Skipper Cliche M.D.   On: 10/26/2013 18:35   Ct Cervical Spine Wo Contrast  10/26/2013   CLINICAL DATA:  Altered mental status, fever, head and neck pain, Initial visit  EXAM: CT HEAD WITHOUT CONTRAST  CT CERVICAL SPINE WITHOUT CONTRAST  TECHNIQUE: Multidetector CT imaging of the head and cervical spine was performed following the standard protocol without intravenous contrast. Multiplanar CT image reconstructions of the cervical spine were also generated.  COMPARISON:  05/21/2011, 04/23/2011  FINDINGS: CT HEAD FINDINGS  Mild diffuse cortical atrophy stable from prior study. No evidence of vascular territory infarct, hemorrhage, or extra-axial fluid. No evidence of mass or hydrocephalus. Calvarium is intact.  CT CERVICAL SPINE FINDINGS  Endotracheal tube and orogastric tube identified. Significant right upper lobe consolidation partially visualized consistent with known right upper  lobe pneumonia.  Cervical spine demonstrates normal alignment with no prevertebral soft tissue swelling and no fracture. Mild C5-6 and C6-7 degenerative disc disease.  IMPRESSION: No acute intracranial abnormality. No acute findings involving the cervical spine. Extensive right upper lobe consolidation consistent with known diagnosis of pneumonia.   Electronically Signed   By: Skipper Cliche M.D.   On: 10/26/2013 18:35   Dg Chest Portable 1 View  10/26/2013   CLINICAL DATA:  Post intubation, [facility] 10/26/2013, fever of 104 degrees, hypotensive, followup pneumonia  EXAM: PORTABLE CHEST - 1 VIEW  COMPARISON:  10/26/2013  FINDINGS: Endotracheal tube tip is 2.2 cm above the carina. No change in left central line.  Right upper lobe infiltrate stable. Areas of discoid atelectasis left mid to lower lung zones similar to prior study. Heart size normal.  IMPRESSION: Endotracheal tube as described.  Stable right upper lobe opacity suggesting pneumonia.   Electronically Signed   By: Skipper Cliche M.D.   On: 10/26/2013 12:47   Dg Chest Portable 1 View  10/26/2013   CLINICAL DATA:  Central line placement subsequent encounter.  EXAM: PORTABLE CHEST - 1 VIEW  COMPARISON:  10/26/2013; 10/10/2013  FINDINGS: Grossly unchanged borderline enlarged cardiac silhouette and mediastinal contours given persistently reduced lung volumes and patient rotation. Interval placement of a left jugular approach intravenous catheter with tip projected over the superior SVC. No pneumothorax. Heterogeneous and consolidative opacities within the right upper long have worsened in the interval. Grossly unchanged bilateral medial basilar heterogeneous opacities, left greater than right. No pleural effusion. Pulmonary venous congestion without frank evidence of edema. Unchanged bones.  IMPRESSION: 1. Interval placement of left jugular approach central venous catheter with tip projected over the superior  cavoatrial junction. No pneumothorax. 2. Worsening right upper lobe heterogeneous/consolidative opacities worrisome for infection. 3. Unchanged bilateral medial basilar heterogeneous opacities, atelectasis versus infiltrate.   Electronically Signed   By: Sandi Mariscal M.D.   On: 10/26/2013 11:36   Dg Chest Port 1 View  10/26/2013   CLINICAL DATA:  Altered mental status. Increased shortness of breath. Decreased oxygen saturation. History of COPD and pneumonia. Initial encounter.  EXAM: PORTABLE CHEST - 1 VIEW  COMPARISON:  10/10/2013; 10/04/2013; chest CT - 10/08/2013  FINDINGS: The examination is degraded due to patient body habitus, portable technique and hypoventilation.  Grossly unchanged enlarged cardiac silhouette and mediastinal contours given kyphotic projection. Grossly unchanged mild tortuosity and ectasia of the thoracic aorta. Lung volumes remain reduced with worsening bibasilar heterogeneous opacities. Worsening heterogeneous opacities with the right upper lung. No pleural effusion or pneumothorax. No evidence of edema. No acute osseus abnormalities.  IMPRESSION: Right upper and bilateral lower lung heterogeneous airspace opacities worrisome for multifocal infection on this hypoventilated, portable and kyphotic examination. A follow-up chest radiograph in 4 to 6 weeks after treatment is recommended to ensure resolution.   Electronically Signed   By: Sandi Mariscal M.D.   On: 10/26/2013 08:58   Dg Abd Portable 1v  10/27/2013   CLINICAL DATA:  Respiratory failure secondary to pneumonia with septic shock ; generalized abdominal pain and discomfort, altered mental status ; recent hospitalization; history of COPD, cirrhosis and chronic renal insufficiency  EXAM: PORTABLE ABDOMEN - 1 VIEW  COMPARISON:  Noncontrast CT scan of the abdomen and pelvis of October 04, 2013  FINDINGS: The bowel gas pattern is nonspecific. There is no evidence of a large or small bowel obstruction. The NG tube tip overlies the mid  gastric body. There are no abnormal soft tissue calcifications. There is a curvilinear structure that projects just to the right of midline at the L5 level. This may reflect an umbilical ring.  IMPRESSION: The bowel gas pattern is nonspecific. A moderate stool burden is present within the colon which could reflect clinical constipation.   Electronically Signed   By: David  Martinique   On: 10/27/2013 16:41    ASSESSMENT / PLAN:  PULMONARY OETT 7.5 on 10/4>> A: Acute respiratory failure 2nd to HCAP Hx of COPD P:   Vent bundle Start SBT's and consider extubation Continue BD's Cont abx as below Consider CT chest to assess for RUL infarct or abscess  CARDIOVASCULAR  Lt IJ cvl 10/4>> A: Septic shock. Hx of PE, CAD, HTN Subacute L femoral, popliteal DVT P:  On levophed 4 mcg/min; vasopressin 3 units/min; CVP at goal > 10; weaning to off  Hold antihypertensives Argatroban for therapeutic anticoagulation  RENAL A:  CKD-III AKI - improving; volume up 1.5L / last 24 hours P:   Monitor renal fx, urine output (0.4 cc/kg/hr over past 24 hours) Replete electrolytes prn  GASTROINTESTINAL A:   Hep B with cirrhosis. MELD = 28 on 10/6 R abdominal pain 10/5 P:   TFs at 65 cc/hr as tolerated Protonix for SUP RUQ [place] vs CT abd if abd pain returns  HEMATOLOGIC A:  Hx iron deficiency anemia. Hx xarelto for PE and INR >4 Thrombocytopenia - stable P:  Monitor CBC Monitor coags Continue argatroban in setting of DVT and PE HIT panel in process No schistocytes on DIC panel SCDs Will give vit K 10/6 (no plan to start coumadin)   INFECTIOUS A:   Septic shock 2nd to HCAP. P:   BC x2 10/4>> Pseudomonas >>  UC  10/4>> no growth Sputum 10/4>> Pseudomonas >>  Viral panel>> Lactate >> 3.75 >> 2.8 Abx: Vanc 10/4 >>> 10/6 Abx: cefepime 10/4 >>> Abx: levaquin 10/4 >>>  ENDOCRINE A:   No acute issues P:   Monitor blood sugar on BMET Continue stress steroids, will wean once of pressors    NEUROLOGIC A:  Acute encephalopathy P:   RASS goal: 0 CT head >> negative   TODAY'S SUMMARY:  58 yo female with sepsis due to HCAP within distribution of PE. Narrow antibiotics based on cultures and sensitivities. Wean sedation and pressors.   Ryan B. Bonner Puna, MD, PGY-2 10/28/2013 6:58 AM  40 minutes CC time  Baltazar Apo, MD, PhD 10/28/2013, 10:13 AM [facility] [phone] or if no answer [phone]

## 2013-10-28 NOTE — Progress Notes (Signed)
Gold ring on right index finger as well as golden ring on left middle finger taken off patient via lube. Both fingers very swollen. Placed in container to give to family when they visit.

## 2013-10-28 NOTE — Clinical Documentation Improvement (Addendum)
Abnormal findings (laboratory, x-ray, MRI/CT scans, and other diagnostic results) are not coded and reported unless the physician indicates their clinical significance. The medical record reflects the following clinical findings. If possible, please help by clarifying the diagnostic and/or clinical significance of these abnormal findings. Thank you!  Abnormal Lab and/or Testing Results: Component      Magnesium Phosphorus  Latest Ref Rng      1.5 - 2.5 mg/dL 2.3 - 4.6 mg/dL  10/26/2013     1:56 PM 1.4 (L) 1.2 (L)    Treatment provided: - Sodium Phosphate 38mEq IV on 10/27/13 - Magnesium sulfate 2g IV on 10/27/13  Possible Clinical Conditions: - Hypomagnesemia - Hypophosphatemia - Both Hypomagnesemia and Hypophosphatemia - Not clinically significant   Thank you for your time with this!   Almon Register, RN Clinical Documentation Improvement Specialist ([phone] / [phone]

## 2013-10-29 ENCOUNTER — Inpatient Hospital Stay (HOSPITAL_COMMUNITY): Payer: Medicare Other

## 2013-10-29 ENCOUNTER — Encounter (HOSPITAL_COMMUNITY): Payer: Self-pay

## 2013-10-29 LAB — HEPARIN INDUCED THROMBOCYTOPENIA PNL
Heparin Induced Plt Ab: POSITIVE
Patient O.D.: 1.526
UFH High Dose UFH H: 1 % Release
UFH LOW DOSE 0.1 IU/ML: 2 %
UFH LOW DOSE 0.5 IU/ML: 2 %
UFH SRA Result: NEGATIVE

## 2013-10-29 LAB — BASIC METABOLIC PANEL
ANION GAP: 9 (ref 5–15)
BUN: 37 mg/dL — ABNORMAL HIGH (ref 6–23)
CHLORIDE: 110 meq/L (ref 96–112)
CO2: 22 meq/L (ref 19–32)
Calcium: 8.3 mg/dL — ABNORMAL LOW (ref 8.4–10.5)
Creatinine, Ser: 1.04 mg/dL (ref 0.50–1.10)
GFR calc non Af Amer: 59 mL/min — ABNORMAL LOW (ref >90)
GFR, EST AFRICAN AMERICAN: 68 mL/min — AB (ref >90)
Glucose, Bld: 91 mg/dL (ref 70–99)
Potassium: 4.7 mEq/L (ref 3.7–5.3)
SODIUM: 141 meq/L (ref 137–147)

## 2013-10-29 LAB — PROTIME-INR
INR: 2.01 — AB (ref 0.00–1.49)
Prothrombin Time: 22.8 seconds — ABNORMAL HIGH (ref 11.6–15.2)

## 2013-10-29 LAB — CBC
HEMATOCRIT: 29.6 % — AB (ref 36.0–46.0)
Hemoglobin: 9.7 g/dL — ABNORMAL LOW (ref 12.0–15.0)
MCH: 30 pg (ref 26.0–34.0)
MCHC: 32.8 g/dL (ref 30.0–36.0)
MCV: 91.6 fL (ref 78.0–100.0)
Platelets: 57 10*3/uL — ABNORMAL LOW (ref 150–400)
RBC: 3.23 MIL/uL — ABNORMAL LOW (ref 3.87–5.11)
RDW: 14.6 % (ref 11.5–15.5)
WBC: 3.7 10*3/uL — ABNORMAL LOW (ref 4.0–10.5)

## 2013-10-29 LAB — CULTURE, RESPIRATORY

## 2013-10-29 LAB — GLUCOSE, CAPILLARY
GLUCOSE-CAPILLARY: 92 mg/dL (ref 70–99)
GLUCOSE-CAPILLARY: 71 mg/dL (ref 70–99)
GLUCOSE-CAPILLARY: 65 mg/dL — AB (ref 70–99)
GLUCOSE-CAPILLARY: 73 mg/dL (ref 70–99)
Glucose-Capillary: 75 mg/dL (ref 70–99)
Glucose-Capillary: 90 mg/dL (ref 70–99)
Glucose-Capillary: 93 mg/dL (ref 70–99)
Glucose-Capillary: 93 mg/dL (ref 70–99)

## 2013-10-29 LAB — CULTURE, BLOOD (ROUTINE X 2)

## 2013-10-29 LAB — CULTURE, RESPIRATORY W GRAM STAIN

## 2013-10-29 LAB — APTT
APTT: 60 s — AB (ref 24–37)
APTT: 53 s — AB (ref 24–37)
aPTT: 35 seconds (ref 24–37)

## 2013-10-29 LAB — LEGIONELLA ANTIGEN, URINE

## 2013-10-29 MED ORDER — ALPRAZOLAM 0.5 MG PO TABS
1.0000 mg | ORAL_TABLET | Freq: Two times a day (BID) | ORAL | Status: DC | PRN
  Administered 2013-10-29 – 2013-10-30 (×3): 1 mg
  Filled 2013-10-29 (×4): qty 2

## 2013-10-29 MED ORDER — WHITE PETROLATUM GEL
Status: AC
  Administered 2013-10-29: 1
  Filled 2013-10-29: qty 5

## 2013-10-29 MED ORDER — DEXTROSE 5 % IV SOLN
2.0000 g | Freq: Three times a day (TID) | INTRAVENOUS | Status: DC
  Administered 2013-10-29 – 2013-10-30 (×4): 2 g via INTRAVENOUS
  Filled 2013-10-29 (×6): qty 2

## 2013-10-29 MED ORDER — ARGATROBAN 50 MG/50ML IV SOLN
0.4000 ug/kg/min | INTRAVENOUS | Status: DC
  Filled 2013-10-29: qty 50

## 2013-10-29 MED ORDER — HYDROCORTISONE NA SUCCINATE PF 100 MG IJ SOLR
50.0000 mg | Freq: Two times a day (BID) | INTRAMUSCULAR | Status: DC
  Administered 2013-10-29: 50 mg via INTRAVENOUS
  Filled 2013-10-29 (×4): qty 1

## 2013-10-29 NOTE — Clinical Social Work Psychosocial (Signed)
Clinical Social Work Department BRIEF PSYCHOSOCIAL ASSESSMENT 10/29/2013  Patient:  [name], [name]     Account Number:  [id number]     [place] date:  10/26/2013  Clinical Social Worker:  Glendon Axe, CLINICAL SOCIAL WORKER  Date/Time:  10/29/2013 01:30 PM  Referred by:  Physician  Date Referred:  10/29/2013 Referred for  SNF Placement   Other Referral:   Interview type:  Other - See comment Other interview type:   Clinical Social Worker spoke with pt's daughter via telephone in reference to discharge plans.    PSYCHOSOCIAL DATA Living Status:  FACILITY Admitted from facility:  [place] Level of care:  [place] Primary support name:  Lalanya Rufener [phone] Primary support relationship to patient:  CHILD, ADULT Degree of support available:   Strong    CURRENT CONCERNS Current Concerns  Post-Acute Placement   Other Concerns:    SOCIAL WORK ASSESSMENT / PLAN Clinical Social Worker spoke with pt's daughter via telephone in reference to post discharge plans. Pt's daughter indicated pt was from [facility] and that pt would not be returning to a SNF. Pt at [facility] for about 2 weeks per pt's daughter. Pt's daughter reported that pt stated she missed her family and friends and wanted to be at home. Pt's daughter stated pt would be coming to live with her and is requesting Home Health instead of SNF. Pt's daughter also reported she would be at home with pt during most of the day. RNCM notified in reference to [place]. CSW signing off.   Assessment/plan status:  No Further Intervention Required Other assessment/ plan:   Information/referral to community resources:    PATIENT'S/FAMILY'S RESPONSE TO PLAN OF CARE: Pt's daughter would like pt at home with her and is requesting Home Health.    Glendon Axe, MSW, LCSWA [phone] 10/29/2013 2:33 PM

## 2013-10-29 NOTE — Progress Notes (Signed)
Patient and order verified.  Extubated patient per Dr. Lamonte Sakai.  MD aware that the patient did not have a ett cuff leak.  Patient placed on Face tent 40% HR 92, RR 15, BP 121/70, Bs coarse.  Lesli Albee RN present during extubation.

## 2013-10-29 NOTE — Care Management Note (Addendum)
Page 1 of 2   11/04/2013     10:43:44 AM CARE MANAGEMENT NOTE 11/04/2013  Patient:  [name],[name]   Account Number:  [id number]  Date Initiated:  10/28/2013  Documentation initiated by:  DOWELL,DEBBIE  Subjective/Objective Assessment:   adm w pneumonia     Action/Plan:   from nsg facility   Anticipated DC Date:  11/02/2013   Anticipated DC Plan:  HOME W HOME HEALTH SERVICES  In-house referral  Clinical Social Worker      DC Planning Services  CM consult      PAC Choice  HOME HEALTH   Choice offered to / List presented to:  C-4 Adult Children        HH arranged  HH-1 RN  HH-2 PT      HH agency  [facility]   Status of service:  Completed, signed off Medicare Important Message given?  YES (If response is "NO", the following Medicare IM given date fields will be blank) Date Medicare IM given:  10/29/2013 Medicare IM given by:  DOWELL,DEBBIE Date Additional Medicare IM given:  11/02/2013 Additional Medicare IM given by:  SARAH JEFFRIES  Discharge Disposition:  HOME W HOME HEALTH SERVICES  Per UR Regulation:  Reviewed for med. necessity/level of care/duration of stay  If discussed at Long Length of Stay Meetings, dates discussed:    Comments:  11/04/13 1040   RN, BSN [phone] NCM received call from other NCM that patient need prior approval for  xarelto, this NCM got the priar auth.  Alesia Ellen Shavis, RN Case Manager Signed CASE MANAGEMENT Progress Notes Service date: 11/04/2013 9:36 AM 11/04/2013 0928 NCM spoke to [facility] pharmacist on [address], # [phone], fax [phone]. Pt received 30 day free trial of Xarelto on 11/03/2013. Prior auth (# [id number] contact no) needed for medication. Will make dc attending aware. Alesia Shavis RN CCM Case Mgmt phone [phone]  11/02/13 12:35 CM met with pt and family in room where pt states she doe NOT want any DME.  Address and contact information verified with pt.  CM notified [facility] rep,  Stephanie of discharge and confirmed pt has HHPT/RN arranged.  No other CM needs were communicated.  Sarah Jeffries, BSN, CM [phone].  da logan Lag[place] [phone]  10/7  1438 debbie dowell rn,bsn spoke w da. she plans to take pt home at disch. she is aware hhc is intermitent and ea visit 30min to 1hr and hhc does not stay. da states w herself and her children they will be w pt. pt has her own room and bathroom. went over hhc agencies and no pref. [facility] and hhpt. will cont to follow.  10/6 sw ref placed.   

## 2013-10-29 NOTE — Progress Notes (Signed)
PT Cancellation Note  Patient Details Name: [name] MRN: [id number] DOB: 08-22-1955   Cancelled Treatment:    Reason Eval/Treat Not Completed: Medical issues which prohibited therapy.  Pt just extubated at 1000 am.  Will evaluate in pm as able or tomorrow am.  Thanks.   Irwin Brakeman F 10/29/2013, 10:55 AM  [facility] [phone] (pager)

## 2013-10-29 NOTE — Progress Notes (Signed)
PULMONARY / CRITICAL CARE MEDICINE   Name: [name] MRN: [id number] DOB: 04-17-1955    ADMISSION DATE:  10/26/2013  REFERRING MD :  EDP  CHIEF COMPLAINT:  Shock  INITIAL PRESENTATION: 58 yo female smoker from SNF with fever, altered mental status, respiratory failure from PNA with septic shock.  She had recent hospital stay for PNA and PE.  She has hx of HTN, cirrhosis, CKD, COPD.  STUDIES:  10/4 CT head >> negative 10/4 Blood culture >> Pseudomonas 10/5 LE doppler >> subacute DVT in left femoral and popliteal vein, improved (post tibial and peroneal thrombi resolved)   SIGNIFICANT EVENTS: 10/4 found down in shock, VDRF 10/5 Argatroban started 10/6 Off pressors, on pressure support  HISTORY OF PRESENT ILLNESS:   58 yo WF discharged 9/22 from [facility] to SNF with discharge dx of: CAP, HTN, Cirrhosis, CKD, COPD, PE, tobacco abuse. She presents to [facility] ED 10/4 after being found on floor in SNF covered in feces, fever 104 and hypotensive. In ED she had refractory hypotension despite levophed, 3 litres of IVF, and CXR findings suggestive of PNA. PCCM asked to admit with diagnosis of sepsis.   SUBJECTIVE: Pt sedated; no family at bedside this AM  VITAL SIGNS: Temp:  [97.3 F (36.3 C)-98.3 F (36.8 C)] 97.5 F (36.4 C) (10/07 0424) Pulse Rate:  [76-95] 76 (10/07 0600) Resp:  [8-27] 14 (10/07 0600) BP: (77-111)/(54-70) 100/64 mmHg (10/06 1913) SpO2:  [94 %-100 %] 97 % (10/07 0600) Arterial Line BP: (86-111)/(55-69) 99/65 mmHg (10/07 0600) FiO2 (%):  [40 %] 40 % (10/07 0346) HEMODYNAMICS:   VENTILATOR SETTINGS: Vent Mode:  [-] PRVC FiO2 (%):  [40 %] 40 % Set Rate:  [14 bmp] 14 bmp Vt Set:  [550 mL] 550 mL PEEP:  [5 cmH20] 5 cmH20 Pressure Support:  [5 cmH20-10 cmH20] 10 cmH20 Plateau Pressure:  [9 cmH20-23 cmH20] 14 cmH20 INTAKE / OUTPUT:  Intake/Output Summary (Last 24 hours) at 10/29/13 0714 Last data filed at 10/29/13 0600  Gross per 24 hour  Intake 1684.78  ml  Output   2345 ml  Net -660.22 ml    PHYSICAL EXAMINATION: General: Obese 57 y.o. female sedated on vent Neuro: Sedated; RASS -1 HEENT: NCAT, PERRL, anicteric sclerae Cardiovascular:  RRR no murmur or JVD. 1+ pitting edema Lungs: Rhonchorous over right anterior chest Abdomen:  Obese, +BS x4 Musculoskeletal: No gross deformities  LABS:  CBC  Recent Labs Lab 10/28/13 0424 10/28/13 1715 10/29/13 0500  WBC 4.9 4.2 3.7*  HGB 9.6* 9.8* 9.7*  HCT 29.0* 30.2* 29.6*  PLT 43* 47* 57*   Coag's  Recent Labs Lab 10/26/13 1356 10/27/13 1130  10/28/13 0424 10/28/13 1150 10/29/13 0500  APTT 52* 50*  50*  < > 80* 73* 35  INR 4.04* 3.50*  3.43*  --   --  3.46*  --   < > = values in this interval not displayed.  BMET  Recent Labs Lab 10/26/13 0913 10/27/13 0335 10/28/13 0424  NA 135* 134* 136*  K 3.9 4.2 4.2  CL 98 101 105  CO2 25 16* 21  BUN 17 21 27*  CREATININE 1.77* 1.56* 1.01  GLUCOSE 101* 157* 148*   Electrolytes  Recent Labs Lab 10/26/13 0913 10/26/13 1356 10/27/13 0335 10/28/13 0424  CALCIUM 8.6  --  7.3* 7.8*  MG  --  1.4*  --  1.9  PHOS  --  1.2*  --  3.2   Sepsis Markers  Recent Labs Lab 10/26/13  0915 10/26/13 1356 10/26/13 1357  LATICACIDVEN 3.75*  --  2.8*  PROCALCITON  --  43.20  --    ABG  Recent Labs Lab 10/26/13 1322 10/27/13 0341  PHART 7.374 7.279*  PCO2ART 37.9 37.2  PO2ART 267.0* 86.9    Liver Enzymes  Recent Labs Lab 10/26/13 0913 10/28/13 0424  AST 36 44*  ALT 58* 43*  ALKPHOS 98 64  BILITOT 1.0 0.3  ALBUMIN 2.7* 1.9*   Cardiac Enzymes No results found for this basename: TROPONINI, PROBNP,  in the last 168 hours  Glucose  Recent Labs Lab 10/28/13 0437 10/28/13 0757 10/28/13 1212 10/28/13 1710 10/28/13 2350 10/29/13 0425  GLUCAP 145* 137* 118* 127* 93 93    Imaging Dg Abd Portable 1v  10/28/2013   CLINICAL DATA:  Subsequent evaluation of 58 year old female for orogastric tube placement.   EXAM: PORTABLE ABDOMEN - 1 VIEW  COMPARISON:  10/27/2013.  FINDINGS: An enteric tube is present with tip in the antral pre-pyloric region of the stomach. Visualized bowel gas pattern is nonobstructive.  IMPRESSION: 1. Enteric tube tip is in the antral pre-pyloric region of the stomach.   Electronically Signed   By: Vinnie Langton M.D.   On: 10/28/2013 18:44   Dg Abd Portable 1v  10/27/2013   CLINICAL DATA:  Respiratory failure secondary to pneumonia with septic shock ; generalized abdominal pain and discomfort, altered mental status ; recent hospitalization; history of COPD, cirrhosis and chronic renal insufficiency  EXAM: PORTABLE ABDOMEN - 1 VIEW  COMPARISON:  Noncontrast CT scan of the abdomen and pelvis of October 04, 2013  FINDINGS: The bowel gas pattern is nonspecific. There is no evidence of a large or small bowel obstruction. The NG tube tip overlies the mid gastric body. There are no abnormal soft tissue calcifications. There is a curvilinear structure that projects just to the right of midline at the L5 level. This may reflect an umbilical ring.  IMPRESSION: The bowel gas pattern is nonspecific. A moderate stool burden is present within the colon which could reflect clinical constipation.   Electronically Signed   By: David  Martinique   On: 10/27/2013 16:41    ASSESSMENT / PLAN:  PULMONARY OETT 7.5 on 10/4>> A: Acute respiratory failure 2nd to HCAP and PE Hx of COPD P:   Vent bundle SBT and likely extubation today Continue BD's Narrow abx as below Consider CT chest to assess for RUL infarct or abscess  CARDIOVASCULAR Lt IJ cvl 10/4>> R Radial A line 10/4>> A: Septic shock - resolved Hx of PE, CAD, HTN Subacute L femoral, popliteal DVT P:  Off pressors - D/C A line Hold antihypertensives Argatroban for therapeutic anticoagulation  RENAL Foley 10/4 >> A:  CKD-III AKI - improving - urine output picking up P:   Monitor urine output (0.9 cc/kg/hr over past 24  hours) Replete electrolytes prn   GASTROINTESTINAL OGT 10/4 >> A:   Hep B with cirrhosis. MELD = 28 on 10/6 R abdominal pain 10/5 P:   TFs at 65 cc/hr - transition to po 4 hours after extubation Protonix for SUP KUB wnl with OGT pre-pyloric 10/6 RUQ [place] vs CT abd if abd pain returns  HEMATOLOGIC A:  Hx iron deficiency anemia - stable hgb Thrombocytopenia - remains stable Hx xarelto for PE and INR >4 Coagulopathy of sepsis vs. related to cirrhosis P:  Monitor CBC Monitor coags Continue argatroban in setting of DVT and PE Vit K 10/6 (no plan to start coumadin) with drop in  aPTT HIT panel (10/5) in process SCDs  INFECTIOUS A:   Septic shock 2nd to HCAP - resolved P:   BC x2 10/4>> Pseudomonas pan-sensitive UC  10/4>> no growth Sputum 10/4>> Pseudomonas and Candida Viral panel 10/5>> Abx: Vanc 10/4 >>> 10/6 Abx: cefepime 10/4 >>> Abx: levaquin 10/4 >>> 10/7  ENDOCRINE A:   No acute issues P:   Monitor blood sugar on BMET Wean stress steroids  NEUROLOGIC A:  Acute encephalopathy P:   RASS goal: 0 CT head >> negative   TODAY'S SUMMARY:  58 yo female with sepsis due to HCAP within distribution of PE. Narrow antibiotics based on cultures and sensitivities. Wean sedation and pressors.   Ryan B. Bonner Puna, MD, PGY-2 10/29/2013 7:14 AM  CC time 35 minutes  Baltazar Apo, MD, PhD 10/29/2013, 9:48 AM [facility] [phone] or if no answer [phone]

## 2013-10-29 NOTE — Progress Notes (Signed)
90 cc Fentanyl wasted in sink with flush with Lenor Coffin, RN  Nikki Dom

## 2013-10-29 NOTE — Progress Notes (Addendum)
ANTICOAGULATION/ANTIBIOTIC CONSULT NOTE - Follow Up Consult  Pharmacy Consult for argatroban/cefepime Indication: h/o PE, r/o HIT/pseudomonas PNA/sepsis  Allergies  Allergen Reactions  . Duloxetine Other (See Comments)    REACTION: Headache  . Gabapentin Other (See Comments)    REACTION: rash in mouth  . Neomycin-Bacitracin Zn-Polymyx Other (See Comments)    REACTION: rash/hives    Patient Measurements: Height: 5\' 4"  (162.6 cm) Weight: 250 lb 14.1 oz (113.8 kg) IBW/kg (Calculated) : 54.7  Vital Signs: Temp: 98.9 F (37.2 C) (10/07 1305) Temp Source: Oral (10/07 1305) BP: 109/76 mmHg (10/07 1400) Pulse Rate: 96 (10/07 1400)  Labs:  Recent Labs  10/27/13 0335 10/27/13 1130  10/28/13 0424 10/28/13 1150 10/28/13 1715 10/29/13 0500 10/29/13 0746 10/29/13 0800 10/29/13 1100  HGB 10.8*  --   --  9.6*  --  9.8* 9.7*  --   --   --   HCT 32.9*  --   --  29.0*  --  30.2* 29.6*  --   --   --   PLT 47* 43*  --  43*  --  47* 57*  --   --   --   APTT  --  50*  50*  < > 80* 73*  --  35  --   --  60*  LABPROT  --  35.1*  34.6*  --   --  34.8*  --   --   --  22.8*  --   INR  --  3.50*  3.43*  --   --  3.46*  --   --   --  2.01*  --   CREATININE 1.56*  --   --  1.01  --   --   --  1.04  --   --   < > = values in this interval not displayed.  Estimated Creatinine Clearance: 74.7 ml/min (by C-G formula based on Cr of 1.04).   Medications:  Scheduled:  . antiseptic oral rinse  7 mL Mouth Rinse QID  . ceFEPime (MAXIPIME) IV  2 g Intravenous 3 times per day  . chlorhexidine  15 mL Mouth Rinse BID  . hydrocortisone sod succinate (SOLU-CORTEF) inj  50 mg Intravenous Q12H  . pantoprazole sodium  40 mg Per Tube QHS     Assessment: 58 yo f admitted on 10/4 with recent hospitalization for PE 10/08/13. Patient was noted with decreased platelets and previous heparin exposure last admission.  HIT panel pending.  Pharmacy was consulted to dose and adjust argatroban for full  anticoagulation and r/o HIT.  Patient was therapeutic x 4 on 0.2 mcg/kg/min.  AM aPTT level was subtherapeutic at 35.  Infusion was increased to 0.3 mcg/kg/min and aPTT obtained 2-hrs post dose change was therapeutic at 60.  Will order a 2-hr aPTT level for 1600.  Patient has pseudomonas sepsis/pneumonia.  Antibiotics were de-escalated this morning and patient was continued on cefepime.  Patient's renal function has improved (SCr 1.04, CrCl ~75 ml/min), so cefepime dose was increased to 2gm IV q8hr.  Wbc 3.7, tmax 98.4. A date has been placed to stop cefepime on 11/05/13.   Goal of Therapy:  aPTT 50-90 seconds Monitor platelets by anticoagulation protocol: Yes Eradication of infection   Plan:  Continue argatroban 0.3 mcg/kg/min Continue cefepime 2gm IV q8hr 2-hr aPTT @ 1600 Daily aPTT and CBC Monitor temperature curve, CBC, clinical course of patient  Cassie L. Nicole Kindred, PharmD Clinical Pharmacy Resident Pager: [phone] 10/29/2013 3:35 PM    ===============================================  Addendum: - aPTT 53s, therapeutic but trending down - no bleeding reported   Plan:  - Increase argatroban to 0.33 mcg/kg/min (increased slightly by 10%) - F/U AM labs    Luv Mish D. Mina Marble, PharmD, BCPS Pager:  [phone] 10/29/2013, 4:35 PM

## 2013-10-30 ENCOUNTER — Inpatient Hospital Stay: Payer: Self-pay | Admitting: Internal Medicine

## 2013-10-30 ENCOUNTER — Encounter (HOSPITAL_COMMUNITY): Payer: Self-pay | Admitting: General Practice

## 2013-10-30 LAB — CBC
HCT: 29.9 % — ABNORMAL LOW (ref 36.0–46.0)
Hemoglobin: 10 g/dL — ABNORMAL LOW (ref 12.0–15.0)
MCH: 30 pg (ref 26.0–34.0)
MCHC: 33.4 g/dL (ref 30.0–36.0)
MCV: 89.8 fL (ref 78.0–100.0)
Platelets: 74 10*3/uL — ABNORMAL LOW (ref 150–400)
RBC: 3.33 MIL/uL — ABNORMAL LOW (ref 3.87–5.11)
RDW: 14.1 % (ref 11.5–15.5)
WBC: 3.4 10*3/uL — ABNORMAL LOW (ref 4.0–10.5)

## 2013-10-30 LAB — BASIC METABOLIC PANEL
Anion gap: 7 (ref 5–15)
BUN: 36 mg/dL — ABNORMAL HIGH (ref 6–23)
CALCIUM: 8.3 mg/dL — AB (ref 8.4–10.5)
CO2: 25 mEq/L (ref 19–32)
Chloride: 110 mEq/L (ref 96–112)
Creatinine, Ser: 1.1 mg/dL (ref 0.50–1.10)
GFR calc Af Amer: 64 mL/min — ABNORMAL LOW (ref >90)
GFR, EST NON AFRICAN AMERICAN: 55 mL/min — AB (ref >90)
Glucose, Bld: 74 mg/dL (ref 70–99)
POTASSIUM: 3.8 meq/L (ref 3.7–5.3)
SODIUM: 142 meq/L (ref 137–147)

## 2013-10-30 LAB — GLUCOSE, CAPILLARY
GLUCOSE-CAPILLARY: 85 mg/dL (ref 70–99)
GLUCOSE-CAPILLARY: 79 mg/dL (ref 70–99)
Glucose-Capillary: 50 mg/dL — ABNORMAL LOW (ref 70–99)
Glucose-Capillary: 58 mg/dL — ABNORMAL LOW (ref 70–99)
Glucose-Capillary: 76 mg/dL (ref 70–99)
Glucose-Capillary: 90 mg/dL (ref 70–99)

## 2013-10-30 LAB — APTT: APTT: 47 s — AB (ref 24–37)

## 2013-10-30 MED ORDER — HYDROCORTISONE NA SUCCINATE PF 100 MG IJ SOLR: 50.0000 mg | Freq: Every day | INTRAMUSCULAR | Status: DC

## 2013-10-30 MED ORDER — ILOPERIDONE 6 MG PO TABS: 12.0000 mg | ORAL_TABLET | Freq: Every day | ORAL | Status: DC

## 2013-10-30 MED ORDER — PANTOPRAZOLE SODIUM 40 MG PO TBEC
40.0000 mg | DELAYED_RELEASE_TABLET | Freq: Every day | ORAL | Status: DC
  Administered 2013-10-30 – 2013-11-01 (×3): 40 mg via ORAL
  Filled 2013-10-30 (×3): qty 1

## 2013-10-30 MED ORDER — TIOTROPIUM BROMIDE MONOHYDRATE 18 MCG IN CAPS
18.0000 ug | ORAL_CAPSULE | Freq: Every day | RESPIRATORY_TRACT | Status: DC
  Administered 2013-10-30 – 2013-11-02 (×4): 18 ug via RESPIRATORY_TRACT
  Filled 2013-10-30: qty 5

## 2013-10-30 MED ORDER — ALBUTEROL SULFATE (2.5 MG/3ML) 0.083% IN NEBU: 2.5000 mg | INHALATION_SOLUTION | Freq: Four times a day (QID) | RESPIRATORY_TRACT | Status: DC | PRN

## 2013-10-30 MED ORDER — TENOFOVIR DISOPROXIL FUMARATE 300 MG PO TABS
300.0000 mg | ORAL_TABLET | ORAL | Status: DC
  Administered 2013-10-30: 300 mg via ORAL
  Filled 2013-10-30: qty 1

## 2013-10-30 MED ORDER — PNEUMOCOCCAL VAC POLYVALENT 25 MCG/0.5ML IJ INJ
0.5000 mL | INJECTION | INTRAMUSCULAR | Status: AC
  Administered 2013-10-31: 0.5 mL via INTRAMUSCULAR
  Filled 2013-10-30: qty 0.5

## 2013-10-30 MED ORDER — RIVAROXABAN 20 MG PO TABS
20.0000 mg | ORAL_TABLET | Freq: Every day | ORAL | Status: DC
  Filled 2013-10-30: qty 1

## 2013-10-30 MED ORDER — INFLUENZA VAC SPLIT QUAD 0.5 ML IM SUSY: 0.5000 mL | PREFILLED_SYRINGE | INTRAMUSCULAR | Status: DC

## 2013-10-30 MED ORDER — NICOTINE 7 MG/24HR TD PT24
7.0000 mg | MEDICATED_PATCH | Freq: Every day | TRANSDERMAL | Status: DC
  Administered 2013-10-31 – 2013-11-02 (×3): 7 mg via TRANSDERMAL
  Filled 2013-10-30 (×3): qty 1

## 2013-10-30 MED ORDER — RIVAROXABAN 20 MG PO TABS
20.0000 mg | ORAL_TABLET | Freq: Every day | ORAL | Status: DC
  Administered 2013-10-30 – 2013-11-02 (×4): 20 mg via ORAL
  Filled 2013-10-30 (×4): qty 1

## 2013-10-30 MED ORDER — BUDESONIDE-FORMOTEROL FUMARATE 160-4.5 MCG/ACT IN AERO
2.0000 | INHALATION_SPRAY | Freq: Two times a day (BID) | RESPIRATORY_TRACT | Status: DC
  Administered 2013-10-30 – 2013-11-02 (×6): 2 via RESPIRATORY_TRACT
  Filled 2013-10-30: qty 6

## 2013-10-30 MED ORDER — CIPROFLOXACIN HCL 750 MG PO TABS
750.0000 mg | ORAL_TABLET | Freq: Two times a day (BID) | ORAL | Status: DC
  Administered 2013-10-30 – 2013-11-02 (×7): 750 mg via ORAL
  Filled 2013-10-30 (×10): qty 1

## 2013-10-30 MED ORDER — AMITRIPTYLINE HCL 100 MG PO TABS
100.0000 mg | ORAL_TABLET | Freq: Every day | ORAL | Status: DC
  Administered 2013-10-30 – 2013-11-01 (×3): 100 mg via ORAL
  Filled 2013-10-30 (×4): qty 1

## 2013-10-30 MED ORDER — ENTECAVIR 0.5 MG PO TABS
0.5000 mg | ORAL_TABLET | ORAL | Status: DC
  Administered 2013-10-31: 0.5 mg via ORAL
  Filled 2013-10-30 (×2): qty 1

## 2013-10-30 MED ORDER — HYDROCORTISONE NA SUCCINATE PF 100 MG IJ SOLR
50.0000 mg | Freq: Every day | INTRAMUSCULAR | Status: DC
  Administered 2013-10-30: 12:00:00 via INTRAVENOUS
  Administered 2013-10-31: 50 mg via INTRAVENOUS
  Filled 2013-10-30 (×3): qty 1

## 2013-10-30 MED ORDER — TRAMADOL HCL 50 MG PO TABS
50.0000 mg | ORAL_TABLET | Freq: Two times a day (BID) | ORAL | Status: DC
  Administered 2013-10-30 – 2013-11-02 (×6): 50 mg via ORAL
  Filled 2013-10-30 (×6): qty 1

## 2013-10-30 MED ORDER — ALPRAZOLAM 0.5 MG PO TABS
1.0000 mg | ORAL_TABLET | Freq: Three times a day (TID) | ORAL | Status: DC | PRN
  Administered 2013-10-30 – 2013-11-02 (×7): 1 mg
  Filled 2013-10-30 (×8): qty 2

## 2013-10-30 MED ORDER — ILOPERIDONE 4 MG PO TABS
12.0000 mg | ORAL_TABLET | Freq: Every day | ORAL | Status: DC
Start: 2013-10-30 — End: 2013-10-31
  Filled 2013-10-30: qty 3

## 2013-10-30 NOTE — Progress Notes (Signed)
PULMONARY / CRITICAL CARE MEDICINE   Name: [name] MRN: [id number] DOB: 1955/12/12    ADMISSION DATE:  10/26/2013  REFERRING MD :  EDP  CHIEF COMPLAINT:  Shock  INITIAL PRESENTATION: 58 yo female smoker from SNF with fever, altered mental status, respiratory failure from PNA with septic shock.  She had recent hospital stay for PNA and PE.  She has hx of HTN, cirrhosis, CKD, COPD.  STUDIES:  10/4 CT head >> negative 10/4 Blood culture >> Pseudomonas 10/5 LE doppler >> subacute DVT in left femoral and popliteal vein, improved (post tibial and peroneal thrombi resolved)   SIGNIFICANT EVENTS: 10/4 found down in shock, VDRF 10/5 Argatroban started 10/6 Off pressors, on pressure support; give vit K 10/7 Extubated  HISTORY OF PRESENT ILLNESS:   58 yo WF discharged 9/22 from [facility] to SNF with discharge dx of: CAP, HTN, Cirrhosis, CKD, COPD, PE, tobacco abuse. She presents to [facility] ED 10/4 after being found on floor in SNF covered in feces, fever 104 and hypotensive. In ED she had refractory hypotension despite levophed, 3 litres of IVF, and CXR findings suggestive of PNA. PCCM asked to admit with diagnosis of sepsis.   SUBJECTIVE: Pt awake and alert, anxious. Desires a different room. Got up to chair and to bedside commode.   VITAL SIGNS: Temp:  [97.6 F (36.4 C)-98.9 F (37.2 C)] 97.6 F (36.4 C) (10/08 0434) Pulse Rate:  [77-96] 78 (10/08 0700) Resp:  [12-31] 27 (10/08 0700) BP: (83-136)/(66-107) 134/88 mmHg (10/08 0700) SpO2:  [88 %-99 %] 97 % (10/08 0700) Arterial Line BP: (93-122)/(57-75) 122/75 mmHg (10/07 1200) FiO2 (%):  [35 %-40 %] 35 % (10/07 1015) Weight:  [245 lb 13 oz (111.5 kg)] 245 lb 13 oz (111.5 kg) (10/08 0500) HEMODYNAMICS:   VENTILATOR SETTINGS:  INTAKE / OUTPUT:  Intake/Output Summary (Last 24 hours) at 10/30/13 8341 Last data filed at 10/30/13 0600  Gross per 24 hour  Intake 666.53 ml  Output   1670 ml  Net -1003.47 ml    PHYSICAL  EXAMINATION: General: Obese 58 y.o. mildly anxious female Neuro: Alert, oriented, speech appropriate HEENT: NCAT, PERRL, anicteric sclerae, +hoarse Cardiovascular:  RRR no murmur or JVD. Trace pitting edema Lungs: Non-labored, clear with some right sided rhonchi Abdomen:  Obese, +BS x4, NT, ND Musculoskeletal: No gross deformities  LABS:  CBC  Recent Labs Lab 10/28/13 1715 10/29/13 0500 10/30/13 0500  WBC 4.2 3.7* 3.4*  HGB 9.8* 9.7* 10.0*  HCT 30.2* 29.6* 29.9*  PLT 47* 57* 74*   Coag's  Recent Labs Lab 10/27/13 1130  10/28/13 1150  10/29/13 0800 10/29/13 1100 10/29/13 1500 10/30/13 0500  APTT 50*  50*  < > 73*  < >  --  60* 53* 47*  INR 3.50*  3.43*  --  3.46*  --  2.01*  --   --   --   < > = values in this interval not displayed.  BMET  Recent Labs Lab 10/28/13 0424 10/29/13 0746 10/30/13 0500  NA 136* 141 142  K 4.2 4.7 3.8  CL 105 110 110  CO2 21 22 25   BUN 27* 37* 36*  CREATININE 1.01 1.04 1.10  GLUCOSE 148* 91 74   Electrolytes  Recent Labs Lab 10/26/13 0913 10/26/13 1356  10/28/13 0424 10/29/13 0746 10/30/13 0500  CALCIUM 8.6  --   < > 7.8* 8.3* 8.3*  MG  --  1.4*  --  1.9  --   --  PHOS  --  1.2*  --  3.2  --   --   < > = values in this interval not displayed. Sepsis Markers  Recent Labs Lab 10/26/13 0915 10/26/13 1356 10/26/13 1357  LATICACIDVEN 3.75*  --  2.8*  PROCALCITON  --  43.20  --    ABG  Recent Labs Lab 10/26/13 1322 10/27/13 0341  PHART 7.374 7.279*  PCO2ART 37.9 37.2  PO2ART 267.0* 86.9   Liver Enzymes  Recent Labs Lab 10/26/13 0913 10/28/13 0424  AST 36 44*  ALT 58* 43*  ALKPHOS 98 64  BILITOT 1.0 0.3  ALBUMIN 2.7* 1.9*   Cardiac Enzymes No results found for this basename: TROPONINI, PROBNP,  in the last 168 hours  Glucose  Recent Labs Lab 10/29/13 0741 10/29/13 1143 10/29/13 1512 10/29/13 1855 10/29/13 1949 10/29/13 2350  GLUCAP 90 75 71 65* 73 90    Imaging Dg Chest Port 1  View  10/29/2013   CLINICAL DATA:  Shortness of breath.  Respiratory failure.  EXAM: PORTABLE CHEST - 1 VIEW  COMPARISON:  10/26/2013  FINDINGS: The ET tube tip is above the carina. There is a nasogastric tube with tip in the stomach. Left IJ catheter is noted with tip in the projection of the SVC. The lung volumes appear low. There is pulmonary vascular congestion and bibasilar atelectasis, left greater than right. Right upper lobe opacity is again noted and appears unchanged.  IMPRESSION: 1. Low lung volumes and bibasilar atelectasis. 2. No change in aeration to the right upper lobe.   Electronically Signed   By: Kerby Moors M.D.   On: 10/29/2013 07:55   Dg Abd Portable 1v  10/28/2013   CLINICAL DATA:  Subsequent evaluation of 58 year old female for orogastric tube placement.  EXAM: PORTABLE ABDOMEN - 1 VIEW  COMPARISON:  10/27/2013.  FINDINGS: An enteric tube is present with tip in the antral pre-pyloric region of the stomach. Visualized bowel gas pattern is nonobstructive.  IMPRESSION: 1. Enteric tube tip is in the antral pre-pyloric region of the stomach.   Electronically Signed   By: Vinnie Langton M.D.   On: 10/28/2013 18:44    ASSESSMENT / PLAN:  PULMONARY OETT 7.5 on 10/4>>10/7 A: Acute respiratory failure 2nd to HCAP and PE Hx of COPD P:   Extubated Continue BD's Cont abx as below  CARDIOVASCULAR Lt IJ cvl 10/4>> R Radial A line 10/4>>10/7 A: Septic shock - resolved Hx of PE, CAD, HTN Subacute L femoral, popliteal DVT P:  Off pressors Argatroban > change back to xarelto   RENAL Foley 10/4 >>10/7 A:  CKD-III AKI - resolved P:   Monitor urine output (0.5 cc/kg/hr over past 24 hours) electrolytes normal: no repletion indicated  GASTROINTESTINAL OGT 10/4 >>10/7 A:   Hep B with cirrhosis. MELD = 28 on 10/6 R abdominal pain 10/5 P:   Transition to po: Advance diet as tolerated and pull OGT  Protonix for SUP RUQ [place] vs CT abd if abd pain  returns  HEMATOLOGIC A:  Hx iron deficiency anemia - stable hgb Thrombocytopenia - remains stable HIT Ab + On argatroban for DVT and PE Coagulopathy of sepsis vs. related to cirrhosis P:  Monitor CBC Monitor coags Continue argatroban HIT panel (10/5): +heparin induced platelet Ab: Avoid heparin SCDs  INFECTIOUS A:   Septic shock 2nd to pseudomonal HCAP and bacteremia - resolved P:   BC x2 10/4>> Pseudomonas pan-sensitive BC x2 10/8>> UC  10/4>> no growth Sputum 10/4>> Pseudomonas and Candida  Viral panel 10/5>> Abx: Vanc 10/4 >>> 10/6 Abx: levaquin 10/4 >>> 10/7 Abx: cefepime 10/4 >>>10/8 Abx: cipro 10/8 >>> stop date 10/18  ENDOCRINE A:   No acute issues P:   Weaning stress steroids  NEUROLOGIC A:  Acute encephalopathy - resolved P:   RASS at goal: 0 CT head >> negative   TODAY'S SUMMARY:  58 yo female with sepsis due to pseudomonal bacteremia and HCAP within distribution of her R PE. Continuing abx, monitor on cipro and discharge for total 14 days. Repeat blood cultures to insure clearance. Echo prior to discharge to r/o endocarditis. Off pressors and vent. Advance diet, wean steroids. Transfer to floor today. Transfer to [facility] as of 10/9, PCCM will follow as a consult  Ryan B. Bonner Puna, MD, PGY-2 10/30/2013 7:22 AM  Baltazar Apo, MD, PhD 10/30/2013, 11:04 AM [facility] [phone] or if no answer [phone]

## 2013-10-30 NOTE — Progress Notes (Addendum)
ANTICOAGULATION CONSULT NOTE - Follow Up Consult  Pharmacy Consult for Argatroban Indication: pulmonary embolus; r/o HIT  Allergies  Allergen Reactions  . Duloxetine Other (See Comments)    REACTION: Headache  . Gabapentin Other (See Comments)    REACTION: rash in mouth  . Neomycin-Bacitracin Zn-Polymyx Other (See Comments)    REACTION: rash/hives    Patient Measurements: Height: 5\' 4"  (162.6 cm) Weight: 250 lb 14.1 oz (113.8 kg) IBW/kg (Calculated) : 54.7  Vital Signs: Temp: 97.6 F (36.4 C) (10/08 0434) Temp Source: Oral (10/08 0434) BP: 123/84 mmHg (10/08 0500) Pulse Rate: 79 (10/08 0500)  Labs:  Recent Labs  10/27/13 1130  10/28/13 0424 10/28/13 1150 10/28/13 1715 10/29/13 0500 10/29/13 0746 10/29/13 0800 10/29/13 1100 10/29/13 1500 10/30/13 0500  HGB  --   < > 9.6*  --  9.8* 9.7*  --   --   --   --  10.0*  HCT  --   < > 29.0*  --  30.2* 29.6*  --   --   --   --  29.9*  PLT 43*  --  43*  --  47* 57*  --   --   --   --  74*  APTT 50*  50*  < > 80* 73*  --  35  --   --  60* 53* 47*  LABPROT 35.1*  34.6*  --   --  34.8*  --   --   --  22.8*  --   --   --   INR 3.50*  3.43*  --   --  3.46*  --   --   --  2.01*  --   --   --   CREATININE  --   --  1.01  --   --   --  1.04  --   --   --  1.10  < > = values in this interval not displayed.  Estimated Creatinine Clearance: 70.6 ml/min (by C-G formula based on Cr of 1.1).  Assessment: 58 yo with h/o PE, Xarelto on hold, for argatroban  Goal of Therapy:  aPTT 50-90 seconds Monitor platelets by anticoagulation protocol: Yes   Plan:  Increase  argatroban 0.4 mcg/kg/min Recheck PTT at noon F/U plan for anticoagulation  Phillis Knack, PharmD, BCPS  10/30/2013 6:03 AM

## 2013-10-30 NOTE — Evaluation (Signed)
Clinical/Bedside Swallow Evaluation Patient Details  Name: [name] MRN: [id number] Date of Birth: 07-25-1955  Today's Date: 10/30/2013 Time: 4765-4650 SLP Time Calculation (min): 14 min  Past Medical History:  Past Medical History  Diagnosis Date  . Hypertension   . COPD (chronic obstructive pulmonary disease)   . Renal insufficiency   . Cirrhosis   . Pulmonary emboli   . Pneumonia    Past Surgical History:  Past Surgical History  Procedure Laterality Date  . Abdominal hysterectomy     HPI:  58 yo female smoker from SNF with fever, altered mental status, respiratory failure from PNA with septic shock. She had recent hospital stay for PNA and PE. She has hx of HTN, cirrhosis, CKD, COPD.  Intubated 10/4-10/7.   No record of prior swallow eval.   Assessment / Plan / Recommendation Clinical Impression  Pt presents with a functional swallow s/p extubation - alert, able to self-feed with sufficient mastication, reliable swallow response, no s/s of aspiration with thin liquids.  Coughing ensued after rapid consumption of solids; however, after pt slowed rate her tolerance improved.  Recommend allowing a regular diet with thin liquids.  No SLP f/u needed - will sign off.     Aspiration Risk  Mild    Diet Recommendation Regular;Thin liquid   Liquid Administration via: Cup;Straw Medication Administration: Whole meds with liquid Supervision: Patient able to self feed Compensations: Slow rate    Other  Recommendations Oral Care Recommendations: Oral care BID   Follow Up Recommendations  None     Swallow Study Prior Functional Status       General Date of Onset: 10/26/13 HPI: 58 yo female smoker from SNF with fever, altered mental status, respiratory failure from PNA with septic shock. She had recent hospital stay for PNA and PE. She has hx of HTN, cirrhosis, CKD, COPD.  Intubated 10/4-10/7.   Type of Study: Bedside swallow evaluation Previous Swallow Assessment: none per  records Diet Prior to this Study: NPO Temperature Spikes Noted: No Respiratory Status: Room air History of Recent Intubation: Yes Length of Intubations (days): 3 days Date extubated: 10/29/13 Behavior/Cognition: Alert;Cooperative Oral Cavity - Dentition: Missing dentition Self-Feeding Abilities: Able to feed self Patient Positioning: Upright in chair Volitional Cough: Strong Volitional Swallow: Able to elicit    Oral/Motor/Sensory Function Overall Oral Motor/Sensory Function: Appears within functional limits for tasks assessed   Ice Chips Ice chips: Within functional limits Presentation: Self Fed   Thin Liquid Thin Liquid: Within functional limits Presentation: Cup;Self Fed    Nectar Thick Nectar Thick Liquid: Not tested   Honey Thick Honey Thick Liquid: Not tested   Puree Puree: Within functional limits Presentation: Self Fed;Spoon   Solid  Kanaya Gunnarson L. Karyna Bessler, [place] CCC/SLP Pager [phone]     Solid:  (coughing episode after consumption of pretzels)       Juan Quam Laurice 10/30/2013,9:55 AM

## 2013-10-30 NOTE — Progress Notes (Signed)
Chaplain responded to page that family and pt had questions about filling out an AD.  Chaplain answered questions and assisted family and pt in better understanding the HCPOA document.  Chaplain facilitated the notarization of document and filed copy with unit secretary to be entered into pt record.  Chaplain also provided emotional and spiritual support while visiting with the family.  Chaplain will follow up as needed.  10/30/13 1000  Clinical Encounter Type  Visited With Patient and family together  Visit Type Initial;Spiritual support;Critical Care  Referral From Family  Spiritual Encounters  Spiritual Needs Literature;Emotional  Stress Factors  Patient Stress Factors Exhausted;Family relationships;Health changes  Family Stress Factors Family relationships;Health changes  Advance Directives (For Healthcare)  Does patient have an advance directive? Yes  Type of Advance Directive Healthcare Power of Attorney  Copy of advanced directive(s) in chart? Yes   Geralyn Flash

## 2013-10-30 NOTE — Evaluation (Signed)
Physical Therapy Evaluation Patient Details Name: [name] MRN: [id number] DOB: 09-09-1955 Today's Date: 10/30/2013   History of Present Illness   58 yo female smoker from SNF with fever, altered mental status, respiratory failure from PNA with septic shock. She had recent hospital stay for PNA and PE. She has hx of HTN, cirrhosis, CKD, COPD. Intubated 10/4-10/7. No record of prior swallow eval.      Clinical Impression  Pt admitted with above. Pt currently with functional limitations due to the deficits listed below (see PT Problem List). Pt and daughter adamant that pt will not go to NH.  Daughter states she will arrange 24 hour care and can handle pt at home.   Pt will benefit from skilled PT to increase their independence and safety with mobility to allow discharge to the venue listed below.     Follow Up Recommendations Home health PT;Supervision/Assistance - 24 hour    Equipment Recommendations  3in1 (PT)    Recommendations for Other Services       Precautions / Restrictions Precautions Precautions: Fall Restrictions Weight Bearing Restrictions: No      Mobility  Bed Mobility               General bed mobility comments: in chair on arrival  Transfers Overall transfer level: Needs assistance Equipment used: 1 person hand held assist Transfers: Sit to/from Stand;Stand Pivot Transfers Sit to Stand: Mod assist Stand pivot transfers: Min assist       General transfer comment: Pt needed cues for hand placement and assist to perform sit to stand.  Pt able to stand fully uprigght however cannot sustain full stand.  Pt was able to take pivotal steps but as pt fatigued she bent too far forward.  Needed min to mod for steadying assist with mobility. Pt needed to use 3N1 therefore PT got pt on 3N1 and then back off 3N1 after pt finished.  Tot assist to wipe pt.    Ambulation/Gait                Stairs            Wheelchair Mobility    Modified  Rankin (Stroke Patients Only)       Balance Overall balance assessment: Needs assistance;History of Falls         Standing balance support: Single extremity supported;During functional activity Standing balance-Leahy Scale: Poor Standing balance comment: Pt able to stand with UE assist for pivot transfer.  Will need a RW for incr stability.                              Pertinent Vitals/Pain Pain Assessment: No/denies pain VSS.    Home Living Family/patient expects to be discharged to:: Private residence Living Arrangements: Children Available Help at Discharge: Family (will have at least 20 hour a day care) Type of Home: Apartment Home Access: Stairs to enter Entrance Stairs-Rails: None Entrance Stairs-Number of Steps: 3 Home Layout: Two level;Able to live on main level with bedroom/bathroom Home Equipment: Other (comment) (home O2)      Prior Function Level of Independence: Independent               Hand Dominance   Dominant Hand: Right    Extremity/Trunk Assessment   Upper Extremity Assessment: Defer to OT evaluation           Lower Extremity Assessment: Generalized weakness      Cervical /  Trunk Assessment: Normal  Communication   Communication: No difficulties  Cognition Arousal/Alertness: Awake/alert Behavior During Therapy: WFL for tasks assessed/performed Overall Cognitive Status: Within Functional Limits for tasks assessed                      General Comments      Exercises        Assessment/Plan    PT Assessment Patient needs continued PT services  PT Diagnosis Generalized weakness   PT Problem List Decreased activity tolerance;Decreased balance;Decreased mobility;Cardiopulmonary status limiting activity  PT Treatment Interventions DME instruction;Gait training;Functional mobility training;Therapeutic activities;Balance training;Patient/family education   PT Goals (Current goals can be found in the Care Plan  section) Acute Rehab PT Goals Patient Stated Goal: get back home PT Goal Formulation: With patient/family Time For Goal Achievement: 11/06/13 Potential to Achieve Goals: Good    Frequency Min 3X/week   Barriers to discharge        Co-evaluation               End of Session Equipment Utilized During Treatment: Gait belt;Oxygen Activity Tolerance: Patient limited by fatigue Patient left: in chair;with call bell/phone within reach;with family/visitor present Nurse Communication: Mobility status         Time: 8338-2505 PT Time Calculation (min): 18 min   Charges:   PT Evaluation $Initial PT Evaluation Tier I: 1 Procedure PT Treatments $Therapeutic Activity: 8-22 mins   PT G CodesDenice Paradise Oct 31, 2013, 11:47 AM [facility] [phone] (pager)

## 2013-10-30 NOTE — Progress Notes (Signed)
[name] [id number]  Transfer Data: 10/30/2013 6:37 PM  Attending Provider: Collene Gobble, MD  PCP:No PCP Per Patient  Code Status: Full  [name] is a 58 y.o. female patient transferred from 22M  -No acute distress noted.  -No complaints of shortness of breath.  -No complaints of chest pain.   Blood pressure 121/77, pulse 91, temperature 98.3 F (36.8 C), temperature source Oral, resp. rate 22, height 5\' 4"  (1.626 m), weight 111.5 kg (245 lb 13 oz), SpO2 99.00%.  ?  IV Fluids: IV in place, occlusive dsg intact without redness, IV cath forearm left, condition patent and no redness  normal saline.  Allergies: Heparin; Duloxetine; Gabapentin; and Neomycin-bacitracin zn-polymyx  Past Medical History:  has a past medical history of Hypertension; COPD (chronic obstructive pulmonary disease); Renal insufficiency; Cirrhosis; Pulmonary emboli; and Pneumonia.  Past Surgical History:  has past surgical history that includes Abdominal hysterectomy.  Social History:  reports that she has quit smoking. Her smoking use included Cigarettes. She smoked 1.00 pack per day. She does not have any smokeless tobacco history on file. She reports that she does not drink alcohol or use illicit drugs.  Skin: intacy   Patient/Family orientated to room. Information packet given to patient/family. Admission inpatient armband information verified with patient/family to include name and date of birth and placed on patient arm. Side rails up x 2, fall assessment and education completed with patient/family. Patient/family able to verbalize understanding of risk associated with falls and verbalized understanding to call for assistance before getting out of bed. Call light within reach. Patient/family able to voice and demonstrate understanding of unit orientation instructions.  Will continue to evaluate and treat per MD orders.

## 2013-10-30 NOTE — Progress Notes (Addendum)
ANTICOAGULATION/ANTIBIOTIC CONSULT NOTE  Pharmacy Consult for Xarelto/Ciprofloxacin Indication: h/o pulmonary embolism; Pseudomonas PNA/Bacteremia  Allergies  Allergen Reactions  . Duloxetine Other (See Comments)    REACTION: Headache  . Gabapentin Other (See Comments)    REACTION: rash in mouth  . Neomycin-Bacitracin Zn-Polymyx Other (See Comments)    REACTION: rash/hives    Patient Measurements: Height: 5\' 4"  (162.6 cm) Weight: 245 lb 13 oz (111.5 kg) IBW/kg (Calculated) : 54.7  Vital Signs: Temp: 98.7 F (37.1 C) (10/08 0849) Temp Source: Oral (10/08 0849) BP: 128/82 mmHg (10/08 1100) Pulse Rate: 88 (10/08 1100)  Labs:  Recent Labs  10/28/13 0424 10/28/13 1150 10/28/13 1715 10/29/13 0500 10/29/13 0746 10/29/13 0800 10/29/13 1100 10/29/13 1500 10/30/13 0500  HGB 9.6*  --  9.8* 9.7*  --   --   --   --  10.0*  HCT 29.0*  --  30.2* 29.6*  --   --   --   --  29.9*  PLT 43*  --  47* 57*  --   --   --   --  74*  APTT 80* 73*  --  35  --   --  60* 53* 47*  LABPROT  --  34.8*  --   --   --  22.8*  --   --   --   INR  --  3.46*  --   --   --  2.01*  --   --   --   CREATININE 1.01  --   --   --  1.04  --   --   --  1.10    Estimated Creatinine Clearance: 69.8 ml/min (by C-G formula based on Cr of 1.1).   Medical History: Past Medical History  Diagnosis Date  . Hypertension   . COPD (chronic obstructive pulmonary disease)   . Renal insufficiency   . Cirrhosis   . Pulmonary emboli   . Pneumonia     Medications:  Scheduled:  . antiseptic oral rinse  7 mL Mouth Rinse QID  . chlorhexidine  15 mL Mouth Rinse BID  . ciprofloxacin  750 mg Oral BID  . hydrocortisone sod succinate (SOLU-CORTEF) inj  50 mg Intravenous Daily  . pantoprazole sodium  40 mg Per Tube QHS  . [START ON 10/31/2013] pneumococcal 23 valent vaccine  0.5 mL Intramuscular Tomorrow-1000  . rivaroxaban  20 mg Oral Q lunch    Assessment: 58 yo f admitted on 10/4 with recent hospitalization for  PE on 10/08/13.  Patient is on Xarelto PTA.  Patient has been receiving Argatroban for full anticoagulation and r/o HIT.  Patient is to transition to home Xarelto today per CCM.  Patient has not received a dose of Xarelto (15 mg BID) since 10/3, but she has been fully anticoagulated with the Argatroban, so will resume Xarelto at 20 mg PO daily with lunch, which was to start on 10/9 (after 21 days of the 15 mg BID).  Xarelto should be started within 2 hours of turning off Argatroban infusion. Hgb 10, plts 74, no s/s of bleeding.  Patient also has pseudomonas PNA/Bacteremia.  Patient has been receiving cefepime since 10/4 but is to transition to PO in anticipation for discharge soon.  Pharmacy is consulted to dose ciprofloxacin. Will begin ciprofloxacin 750 mg PO BID.  Wbc 3.4, tmax 98.9, SCr 1.1 (improving from admission).  Cipro PO 10/8 >> (10/17)  Cefepime 10/4>>10/8 Vanc 10/4>>10/6 Levaquin 10/4>> 10/7  10/4 Bld x2>> pseudomonas 10/4 Urine>> negative  10/4 Trach asp>> pseudomonas  Goal of Therapy:  Monitor platelets by anticoagulation protocol: Yes Eradication of infection   Plan:  Xarelto 20 mg PO daily given with lunch Ciprofloxacin 750 mg PO BID (stop date 10/17) Monitor CBC, s/s of bleeding, temperature curve, clinical course  Luise Yamamoto L. Nicole Kindred, PharmD Clinical Pharmacy Resident Pager: [phone] 10/30/2013 11:57 AM

## 2013-10-31 DIAGNOSIS — I82409 Acute embolism and thrombosis of unspecified deep veins of unspecified lower extremity: Secondary | ICD-10-CM

## 2013-10-31 DIAGNOSIS — A4152 Sepsis due to Pseudomonas: Secondary | ICD-10-CM | POA: Diagnosis not present

## 2013-10-31 DIAGNOSIS — R7881 Bacteremia: Secondary | ICD-10-CM

## 2013-10-31 LAB — RESPIRATORY VIRUS PANEL
Adenovirus: NOT DETECTED
Influenza A H1: NOT DETECTED
Influenza A H3: NOT DETECTED
Influenza A: NOT DETECTED
Influenza B: NOT DETECTED
Metapneumovirus: NOT DETECTED
PARAINFLUENZA 1 A: NOT DETECTED
PARAINFLUENZA 2 A: NOT DETECTED
Parainfluenza 3: NOT DETECTED
RESPIRATORY SYNCYTIAL VIRUS B: NOT DETECTED
Respiratory Syncytial Virus A: NOT DETECTED
Rhinovirus: NOT DETECTED

## 2013-10-31 MED ORDER — ILOPERIDONE 6 MG PO TABS
12.0000 mg | ORAL_TABLET | Freq: Every day | ORAL | Status: DC
  Administered 2013-10-31 – 2013-11-02 (×4): 12 mg via ORAL
  Filled 2013-10-31: qty 2

## 2013-10-31 MED ORDER — ILOPERIDONE 4 MG PO TABS: 12.0000 mg | ORAL_TABLET | Freq: Every day | ORAL | Status: DC

## 2013-10-31 MED ORDER — ENTECAVIR 0.5 MG PO TABS
0.5000 mg | ORAL_TABLET | Freq: Every day | ORAL | Status: DC
  Administered 2013-10-31 – 2013-11-01 (×2): 0.5 mg via ORAL
  Filled 2013-10-31: qty 1

## 2013-10-31 MED ORDER — WHITE PETROLATUM GEL
Status: AC
  Administered 2013-10-31: 0.2
  Filled 2013-10-31: qty 5

## 2013-10-31 MED ORDER — TENOFOVIR DISOPROXIL FUMARATE 300 MG PO TABS
300.0000 mg | ORAL_TABLET | Freq: Every day | ORAL | Status: DC
  Administered 2013-10-31 – 2013-11-01 (×2): 300 mg via ORAL
  Filled 2013-10-31 (×3): qty 1

## 2013-10-31 NOTE — Progress Notes (Addendum)
PROGRESS NOTE  [name] YJE:[id number] DOB: 1955-07-22 DOA: 10/26/2013 PCP: Philis Fendt, MD  HPI/Subjective: Pt is a 58 yo female smoker with a history of hypertension, cirrhosis, CKD, COPD and a recent hospitalization for PE (Discharged 9/22) who was readmitted after a fall for respiratory failure secondary to HCAP with septic shock.  She was found to have Pseudomonas bacteremia.  On 10/8, the patient appears comfortable.  She has been eating solid foods.  She states she is continuing to cough and produce sputum.    Assessment/Plan: Acute Respiratory Failure  - Secondary to HCAP and PE (see below) - Intubated 10/4 - 10/7 - Continue Symbicort, Proventil, and Spiriva. - Continue antibiotics as listed below  Septic Shock - Secondary to HCAP/PE and Pseudomonal bacteremia (see below) - Resolved.    Pseudomonal Bacteremia - 2 positive blood cultures on 10/4. -  Patient received Vancomycin (10/4-->10/6), Levaquin (10/4 -->10/7), and Cefepime (10/4-->10/8).  Currently on Cipro-stop date 11/10/15 - Repeat blood cultures drawn on 10/8.  No growth to date.   - Will need Echo before discharge to r/o Endocarditis.  HCAP - Unclear etiology.  Sputum culture (10/4) showed Pseudomonas and Candida.  Within the distribution of RUL PE. - Patient received Vancomycin (10/4-->10/6), Levaquin (10/4 -->10/7), and Cefepime (10/4-->10/8).  Patient also is currently receiving Cipro beginning on 10/8 and will continue until 10/18. - Patient continues to have productive cough.   - Continue bronchodilators and Cipro.  Encourage incentive spirometry and ambulation with assistance.   - Consider to begin tapering steroids.  PE with DVT - Previous hospitalization (discharged 9/22).  Within RUL - same distribution of HCAP.   - HIT Ab + on 10/5. Venous Doppler on 10/5 shows improved compared to previous study. -  Continue Xarelto.  Was on Argatroban during hospitalization.  Changed back to Xarelto on  10/8.  Acute on Chronic Kidney disease - Patient has CKD Stage III.  AKI secondary to septic shock. - Resolved. - Continue to monitor urine output and electrolytes.    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   - No complaints of abdominal pain on 10/9.   - Receiving Protonix for Stress Ulcer Prophylaxis  Thrombocytopenia - HIT Ab + on 10/5 - Avoid Heparin. - Patient's platelets remains stable and steadily increasing.    Acute Encephalopathy - Secondary to sepsis. - Resolved when transitioned into my care.   DVT Prophylaxis:  Xarelto and SCDs  Code Status: Full Family Communication: none - patient alert and awake and agreeable to plan Disposition Plan: Remain inpatient for now.  Consultants:  PCCM  Procedures:  Venous Doppler Ultrasound  CT cervical spine   CT head without contrast  Antibiotics: Anti-infectives   Start     Dose/Rate Route Frequency Ordered Stop   10/30/13 2100  tenofovir (VIREAD) tablet 300 mg     300 mg Oral Every other day 10/30/13 1826     10/30/13 2100  entecavir (BARACLUDE) tablet 0.5 mg     0.5 mg Oral Every other day 10/30/13 1826     10/30/13 1200  ciprofloxacin (CIPRO) tablet 750 mg     750 mg Oral 2 times daily 10/30/13 1141 11/09/13 2359   10/29/13 0930  ceFEPIme (MAXIPIME) 2 g in dextrose 5 % 50 mL IVPB  Status:  Discontinued     2 g 100 mL/hr over 30 Minutes Intravenous 3 times per day 10/29/13 0852 10/30/13 1141   10/27/13 1000  ceFEPIme (MAXIPIME)  1 g in dextrose 5 % 50 mL IVPB  Status:  Discontinued     1 g 100 mL/hr over 30 Minutes Intravenous Every 24 hours 10/26/13 1005 10/29/13 0852   10/26/13 2300  vancomycin (VANCOCIN) IVPB 750 mg/150 ml premix  Status:  Discontinued     750 mg 150 mL/hr over 60 Minutes Intravenous Every 12 hours 10/26/13 1206 10/28/13 1011   10/26/13 1600  levofloxacin (LEVAQUIN) IVPB 750 mg  Status:  Discontinued     750 mg 100 mL/hr over 90 Minutes Intravenous Every 24 hours  10/26/13 1454 10/29/13 0949   10/26/13 1000  vancomycin (VANCOCIN) IVPB 1000 mg/200 mL premix     1,000 mg 200 mL/hr over 60 Minutes Intravenous  Once 10/26/13 0949 10/26/13 1134   10/26/13 0900  ceFEPIme (MAXIPIME) 2 g in dextrose 5 % 50 mL IVPB     2 g 100 mL/hr over 30 Minutes Intravenous  Once 10/26/13 0852 10/26/13 1027      Objective: Filed Vitals:   10/30/13 2039 10/30/13 2142 10/31/13 0500 10/31/13 0535  BP:  117/76  117/74  Pulse: 86 82  78  Temp:  98.3 F (36.8 C)  98.2 F (36.8 C)  TempSrc:  Oral  Oral  Resp: 22 22  18   Height:      Weight:   111 kg (244 lb 11.4 oz)   SpO2:  99%  97%    Intake/Output Summary (Last 24 hours) at 10/31/13 1026 Last data filed at 10/31/13 0825  Gross per 24 hour  Intake   1247 ml  Output   1550 ml  Net   -303 ml   Filed Weights   10/28/13 0351 10/30/13 0500 10/31/13 0500  Weight: 113.8 kg (250 lb 14.1 oz) 111.5 kg (245 lb 13 oz) 111 kg (244 lb 11.4 oz)    Exam: General: Obese middle aged female sitting in chair in NAD, appears stated age  HEENT:  Anicteic Sclera, MMM. Neck is supple, no JVD, no masses.  Cardiovascular: RRR, S1 S2 auscultated, no rubs, murmurs or gallops.   Respiratory: Crackles present in right upper lobe.  No wheezing, rales, or crackles in left lung field.  Equal chest rise  Abdomen: Firm, nontender, distended, + bowel sounds  Extremities: Warm dry without cyanosis clubbing or edema. Left hand shows significant bruising. Neuro: AAOx3, cranial nerves grossly intact. Strength 5/5 in upper and lower extremities   Psych: Normal affect and demeanor with intact judgement and insight   Data Reviewed: Basic Metabolic Panel:  Recent Labs Lab 10/26/13 0913 10/26/13 1356 10/27/13 0335 10/28/13 0424 10/29/13 0746 10/30/13 0500  NA 135*  --  134* 136* 141 142  K 3.9  --  4.2 4.2 4.7 3.8  CL 98  --  101 105 110 110  CO2 25  --  16* 21 22 25   GLUCOSE 101*  --  157* 148* 91 74  BUN 17  --  21 27* 37* 36*    CREATININE 1.77*  --  1.56* 1.01 1.04 1.10  CALCIUM 8.6  --  7.3* 7.8* 8.3* 8.3*  MG  --  1.4*  --  1.9  --   --   PHOS  --  1.2*  --  3.2  --   --    Liver Function Tests:  Recent Labs Lab 10/26/13 0913 10/28/13 0424  AST 36 44*  ALT 58* 43*  ALKPHOS 98 64  BILITOT 1.0 0.3  PROT 6.1 5.6*  ALBUMIN 2.7* 1.9*  CBC:  Recent Labs Lab 10/26/13 0913 10/27/13 0335 10/27/13 1130 10/28/13 0424 10/28/13 1715 10/29/13 0500 10/30/13 0500  WBC 7.3 13.3*  --  4.9 4.2 3.7* 3.4*  NEUTROABS 6.1  --   --   --  3.7  --   --   HGB 11.4* 10.8*  --  9.6* 9.8* 9.7* 10.0*  HCT 34.3* 32.9*  --  29.0* 30.2* 29.6* 29.9*  MCV 94.0 94.3  --  91.8 93.8 91.6 89.8  PLT 67* 47* 43* 43* 47* 57* 74*   BNP (last 3 results)  Recent Labs  12/21/12 1630 07/14/13 2120  PROBNP 149.3* 40.7   CBG:  Recent Labs Lab 10/30/13 0428 10/30/13 0846 10/30/13 0917 10/30/13 1200 10/30/13 1551  GLUCAP 76 50* 85 58* 79    Recent Results (from the past 240 hour(s))  CULTURE, BLOOD (ROUTINE X 2)     Status: None   Collection Time    10/26/13  9:13 AM      Result Value Ref Range Status   Specimen Description BLOOD RIGHT FOREARM   Final   Special Requests BOTTLES DRAWN AEROBIC AND ANAEROBIC 5 CC   Final   Culture  Setup Time     Final   Value: 10/26/2013 18:21     Performed at [facility]   Culture     Final   Value: PSEUDOMONAS AERUGINOSA     Note: Gram Stain Report Called to,Read Back By and Verified With: CHRISTIAN@10 :17AM ON 01/27/13 BY DANTS     Performed at [facility]   Report Status 10/29/2013 FINAL   Final   Organism ID, Bacteria PSEUDOMONAS AERUGINOSA   Final  URINE CULTURE     Status: None   Collection Time    10/26/13  9:30 AM      Result Value Ref Range Status   Specimen Description URINE, CATHETERIZED   Final   Special Requests NONE   Final   Culture  Setup Time     Final   Value: 10/26/2013 17:48     Performed at [facility]     Final    Value: NO GROWTH     Performed at [facility]   Culture     Final   Value: NO GROWTH     Performed at [facility]   Report Status 10/27/2013 FINAL   Final  CULTURE, BLOOD (ROUTINE X 2)     Status: None   Collection Time    10/26/13 10:05 AM      Result Value Ref Range Status   Specimen Description BLOOD RIGHT HAND   Final   Special Requests BOTTLES DRAWN AEROBIC ONLY 10CC   Final   Culture  Setup Time     Final   Value: 10/26/2013 18:21     Performed at [facility]   Culture     Final   Value:        BLOOD CULTURE RECEIVED NO GROWTH TO DATE CULTURE WILL BE HELD FOR 5 DAYS BEFORE ISSUING A FINAL NEGATIVE REPORT     Performed at [facility]   Report Status PENDING   Incomplete  MRSA PCR SCREENING     Status: None   Collection Time    10/26/13  1:31 PM      Result Value Ref Range Status   MRSA by PCR NEGATIVE  NEGATIVE Final   Comment:  The GeneXpert MRSA Assay (FDA     approved for NASAL specimens     only), is one component of a     comprehensive MRSA colonization     surveillance program. It is not     intended to diagnose MRSA     infection nor to guide or     monitor treatment for     MRSA infections.  CULTURE, RESPIRATORY (NON-EXPECTORATED)     Status: None   Collection Time    10/26/13  2:03 PM      Result Value Ref Range Status   Specimen Description TRACHEAL ASPIRATE   Final   Special Requests NONE   Final   Gram Stain     Final   Value: MODERATE WBC PRESENT, PREDOMINANTLY PMN     FEW SQUAMOUS EPITHELIAL CELLS PRESENT     MODERATE GRAM NEGATIVE RODS     FEW GRAM POSITIVE RODS     FEW YEAST WITH PSEUDOHYPHAE   Culture     Final   Value: ABUNDANT PSEUDOMONAS AERUGINOSA     ABUNDANT CANDIDA ALBICANS     Performed at [facility]   Report Status 10/29/2013 FINAL   Final   Organism ID, Bacteria PSEUDOMONAS AERUGINOSA   Final  CULTURE, BLOOD (ROUTINE X 2)     Status: None   Collection Time    10/30/13 11:45 AM       Result Value Ref Range Status   Specimen Description BLOOD LEFT HAND   Final   Special Requests BOTTLES DRAWN AEROBIC ONLY 2CC   Final   Culture  Setup Time     Final   Value: 10/30/2013 16:53     Performed at [facility]   Culture     Final   Value:        BLOOD CULTURE RECEIVED NO GROWTH TO DATE CULTURE WILL BE HELD FOR 5 DAYS BEFORE ISSUING A FINAL NEGATIVE REPORT     Performed at [facility]   Report Status PENDING   Incomplete  CULTURE, BLOOD (ROUTINE X 2)     Status: None   Collection Time    10/30/13 12:00 PM      Result Value Ref Range Status   Specimen Description BLOOD RIGHT ANTECUBITAL   Final   Special Requests BOTTLES DRAWN AEROBIC ONLY 4CC   Final   Culture  Setup Time     Final   Value: 10/30/2013 16:53     Performed at [facility]   Culture     Final   Value:        BLOOD CULTURE RECEIVED NO GROWTH TO DATE CULTURE WILL BE HELD FOR 5 DAYS BEFORE ISSUING A FINAL NEGATIVE REPORT     Performed at [facility]   Report Status PENDING   Incomplete     Scheduled Meds: . amitriptyline  100 mg Oral QHS  . budesonide-formoterol  2 puff Inhalation BID  . ciprofloxacin  750 mg Oral BID  . entecavir  0.5 mg Oral QODAY  . hydrocortisone sod succinate (SOLU-CORTEF) inj  50 mg Intravenous Daily  . Iloperidone  12 mg Oral Daily  . nicotine  7 mg Transdermal Daily  . pantoprazole  40 mg Oral QHS  . rivaroxaban  20 mg Oral Q lunch  . tenofovir  300 mg Oral QODAY  . tiotropium  18 mcg Inhalation Daily  . traMADol  50 mg Oral BID   Continuous Infusions: . sodium chloride 500 mL (  10/30/13 1846)    Principal Problem:   Sepsis Active Problems:   Hepatitis B virus infection   Acute hepatitis C virus infection   Hepatic cirrhosis   Bacteremia   PE (pulmonary embolism)   HCAP (healthcare-associated pneumonia)   DVT (deep venous thrombosis)   Iron deficiency anemia   Anxiety state   Coronary atherosclerosis   Cocaine abuse   CKD  (chronic kidney disease), stage III   COPD (chronic obstructive pulmonary disease)   Altered mental state  Rockwell Germany, PA-S   [facility] Pager [phone]. If 7PM-7AM, please contact night-coverage at [email or web address], password [facility] 10/31/2013, 10:26 AM  LOS: 5 days   Attending Patient seen and examined, agree with the above assessment and plan. Admitted, with Septic Shock/Resp Failure-managed in the ICU. Blood Cultures positive for  Pseudomonas-on Cipro-stop date-10/18-clinically improved, if improvement continues-then home in the next few days  Nena Alexander MD

## 2013-10-31 NOTE — Progress Notes (Signed)
Physical Therapy Treatment Patient Details Name: [name] MRN: [id number] DOB: May 20, 1955 Today's Date: 10/31/2013    History of Present Illness Patient admitted with CAP and PE, developed ARF.  H/O COPD.    PT Comments    Progressing well with mobility.  Endurance improving.  Mild dyspnea with exertion.  Follow Up Recommendations  Home health PT;Supervision/Assistance - 24 hour     Equipment Recommendations  3in1 (PT)    Recommendations for Other Services       Precautions / Restrictions Precautions Precautions: Fall    Mobility  Bed Mobility               General bed mobility comments: in chair on arrival  Transfers Overall transfer level: Needs assistance Equipment used: 1 person hand held assist;Rolling walker (2 wheeled) Transfers: Sit to/from Stand Sit to Stand: Min guard         General transfer comment: cues for hand placement  Ambulation/Gait Ambulation/Gait assistance: Supervision Ambulation Distance (Feet): 300 Feet Assistive device: Rolling walker (2 wheeled) Gait Pattern/deviations: Step-through pattern;Wide base of support Gait velocity: decreased, but able to speed up minimally   General Gait Details: generally steady though slower with very light use of the RW   Stairs            Wheelchair Mobility    Modified Rankin (Stroke Patients Only)       Balance Overall balance assessment: Needs assistance   Sitting balance-Leahy Scale: Good       Standing balance-Leahy Scale: Fair                      Cognition Arousal/Alertness: Awake/alert Behavior During Therapy: WFL for tasks assessed/performed Overall Cognitive Status: Within Functional Limits for tasks assessed                      Exercises General Exercises - Lower Extremity Hip Flexion/Marching: AROM;Strengthening;Both;10 reps;Seated (resisted flexion/extention) Other Exercises Other Exercises: bicep/tricep press with resistance in  flexion/ext.  x10 resisted.    General Comments        Pertinent Vitals/Pain Pain Assessment: No/denies pain    Home Living                      Prior Function            PT Goals (current goals can now be found in the care plan section) Acute Rehab PT Goals Patient Stated Goal: get back home PT Goal Formulation: With patient/family Time For Goal Achievement: 11/06/13 Potential to Achieve Goals: Good Progress towards PT goals: Progressing toward goals    Frequency  Min 3X/week    PT Plan Current plan remains appropriate    Co-evaluation             End of Session   Activity Tolerance: Patient limited by fatigue Patient left: in chair;with call bell/phone within reach;with family/visitor present     Time: 2951-8841 PT Time Calculation (min): 28 min  Charges:  $Gait Training: 8-22 mins $Therapeutic Exercise: 8-22 mins                    G Codes:      Raife Lizer, Tessie Fass 10/31/2013, 1:24 PM 10/31/2013  Donnella Sham, PT [phone]  (pager)

## 2013-10-31 NOTE — Discharge Instructions (Signed)
Information on my medicine - XARELTO (rivaroxaban)  WHY WAS XARELTO PRESCRIBED FOR YOU? Xarelto was prescribed to treat blood clots that may have been found in the veins of your legs (deep vein thrombosis) or in your lungs (pulmonary embolism) and to reduce the risk of them occurring again.  What do you need to know about Xarelto? The starting dose is one 15 mg tablet taken TWICE daily with food for the FIRST 21 DAYS then on (enter date)  10/31/13  the dose is changed to one 20 mg tablet taken ONCE A DAY with your evening meal.  DO NOT stop taking Xarelto without talking to the health care provider who prescribed the medication.  Refill your prescription for 20 mg tablets before you run out.  After discharge, you should have regular check-up appointments with your healthcare provider that is prescribing your Xarelto.  In the future your dose may need to be changed if your kidney function changes by a significant amount.  What do you do if you miss a dose? If you are taking Xarelto TWICE DAILY and you miss a dose, take it as soon as you remember. You may take two 15 mg tablets (total 30 mg) at the same time then resume your regularly scheduled 15 mg twice daily the next day.  If you are taking Xarelto ONCE DAILY and you miss a dose, take it as soon as you remember on the same day then continue your regularly scheduled once daily regimen the next day. Do not take two doses of Xarelto at the same time.   Important Safety Information Xarelto is a blood thinner medicine that can cause bleeding. You should call your healthcare provider right away if you experience any of the following:   Bleeding from an injury or your nose that does not stop.   Unusual colored urine (red or dark brown) or unusual colored stools (red or black).   Unusual bruising for unknown reasons.   A serious fall or if you hit your head (even if there is no bleeding).  Some medicines may interact with Xarelto and  might increase your risk of bleeding while on Xarelto. To help avoid this, consult your healthcare provider or pharmacist prior to using any new prescription or non-prescription medications, including herbals, vitamins, non-steroidal anti-inflammatory drugs (NSAIDs) and supplements.  This website has more information on Xarelto: [email or web address]

## 2013-11-01 DIAGNOSIS — F411 Generalized anxiety disorder: Secondary | ICD-10-CM

## 2013-11-01 LAB — CULTURE, BLOOD (ROUTINE X 2): Culture: NO GROWTH

## 2013-11-01 LAB — CBC
HCT: 29.9 % — ABNORMAL LOW (ref 36.0–46.0)
HEMOGLOBIN: 10.2 g/dL — AB (ref 12.0–15.0)
MCH: 30.5 pg (ref 26.0–34.0)
MCHC: 34.1 g/dL (ref 30.0–36.0)
MCV: 89.5 fL (ref 78.0–100.0)
Platelets: 120 10*3/uL — ABNORMAL LOW (ref 150–400)
RBC: 3.34 MIL/uL — ABNORMAL LOW (ref 3.87–5.11)
RDW: 14.2 % (ref 11.5–15.5)
WBC: 5.3 10*3/uL (ref 4.0–10.5)

## 2013-11-01 MED ORDER — SPIRONOLACTONE 50 MG PO TABS
50.0000 mg | ORAL_TABLET | Freq: Two times a day (BID) | ORAL | Status: DC
  Administered 2013-11-01 – 2013-11-02 (×3): 50 mg via ORAL
  Filled 2013-11-01 (×6): qty 1

## 2013-11-01 MED ORDER — HYDROCORTISONE NA SUCCINATE PF 100 MG IJ SOLR
25.0000 mg | Freq: Every day | INTRAMUSCULAR | Status: AC
  Administered 2013-11-01 – 2013-11-02 (×2): 25 mg via INTRAVENOUS
  Filled 2013-11-01 (×2): qty 0.5

## 2013-11-01 NOTE — Progress Notes (Addendum)
PATIENT DETAILS Name: [name] Age: 58 y.o. Sex: female Date of Birth: [birth year] Admit Date: 10/26/2013 Admitting Physician Chesley Mires, MD XAJ:OINOMVE,HMCNO A, MD  Subjective: No major complaints-except for lower extremity edema.  Assessment/Plan: Principal Problem: Acute Respiratory Failure  - Secondary to HCAP and PE (see below)  - Intubated from 10/4 - 10/7 -doing well-now on room air  Active Problems: Septic Shock  - Secondary to HCAP/PE and Pseudomonal bacteremia (see below)  - Resolved. Taper off hydrocortisone  Pseudomonal Bacteremia -Positive blood cultures on 10/4-has had numerous broad-spectrum antibiotics since admission, currently onstop date 11/10/15.Doing well, afebrile, no leukocytosis. Repeat blood cultures on 10/8 negative to date.Transthoracic echocardiogramOn 9/16 shows no significant valvular abnormalities.   HCAP -Seen on x-ray on admission, on broad-spectrum antibiotics since admission including vancomycin, cefepime- given pseudomonal bacteremia-antibiotics now now it just to ciprofloxacin-started 10/18. Doing very well,Remains afebrile and without leukocytosis.  PE with subacute DVT - Continue with Xarelto, while intubated was on an Arg[place]oban  Acute thrombocytopenia - Secondary to sepsis and heparin-induced thrombocytopenia - Currently on Xarelto, previously Argatoban -Platelet count rebounding  Acute encephalopathy - Secondary to sepsis - Resolved with supportive care, completely awake alert and back to her usual baseline  Acute on chronic kidney disease stage III  - Acute renal failure-resolved with supportive care.  Cirrhosis - Now with significant lower extremity edema-will start Aldactone-if blood pressure permits-will add low-dose Lasix  Hepatitis B - Continue with antiretrovirals.  COPD - Lungs clear-appear stable-continue with Spiriva/Symbicort  Anxiety - When necessary Xanax  Disposition: Remain inpatient-home in  am  DVT Prophylaxis: On Xarelto  Code Status: Full code  Family Communication None at bedside  Procedures:  None  CONSULTS:  None  Time spent 40 minutes-which includes 50% of the time with face-to-face with patient/ family and coordinating care related to the above assessment and plan.  MEDICATIONS: Scheduled Meds: . amitriptyline  100 mg Oral QHS  . budesonide-formoterol  2 puff Inhalation BID  . ciprofloxacin  750 mg Oral BID  . entecavir  0.5 mg Oral Daily  . hydrocortisone sod succinate (SOLU-CORTEF) inj  25 mg Intravenous Daily  . Iloperidone  12 mg Oral Daily  . nicotine  7 mg Transdermal Daily  . pantoprazole  40 mg Oral QHS  . rivaroxaban  20 mg Oral Q lunch  . spironolactone  50 mg Oral BID  . tenofovir  300 mg Oral Daily  . tiotropium  18 mcg Inhalation Daily  . traMADol  50 mg Oral BID   Continuous Infusions:  PRN Meds:.albuterol, ALPRAZolam, bisacodyl  Antibiotics: Anti-infectives   Start     Dose/Rate Route Frequency Ordered Stop   10/31/13 2000  entecavir (BARACLUDE) tablet 0.5 mg     0.5 mg Oral Daily 10/31/13 1502     10/31/13 2000  tenofovir (VIREAD) tablet 300 mg     300 mg Oral Daily 10/31/13 1502     10/30/13 2100  tenofovir (VIREAD) tablet 300 mg  Status:  Discontinued     300 mg Oral Every other day 10/30/13 1826 10/31/13 1502   10/30/13 2100  entecavir (BARACLUDE) tablet 0.5 mg  Status:  Discontinued     0.5 mg Oral Every other day 10/30/13 1826 10/31/13 1502   10/30/13 1200  ciprofloxacin (CIPRO) tablet 750 mg     750 mg Oral 2 times daily 10/30/13 1141 11/09/13 2359   10/29/13 0930  ceFEPIme (MAXIPIME) 2 g in dextrose 5 % 50  mL IVPB  Status:  Discontinued     2 g 100 mL/hr over 30 Minutes Intravenous 3 times per day 10/29/13 0852 10/30/13 1141   10/27/13 1000  ceFEPIme (MAXIPIME) 1 g in dextrose 5 % 50 mL IVPB  Status:  Discontinued     1 g 100 mL/hr over 30 Minutes Intravenous Every 24 hours 10/26/13 1005 10/29/13 0852   10/26/13  2300  vancomycin (VANCOCIN) IVPB 750 mg/150 ml premix  Status:  Discontinued     750 mg 150 mL/hr over 60 Minutes Intravenous Every 12 hours 10/26/13 1206 10/28/13 1011   10/26/13 1600  levofloxacin (LEVAQUIN) IVPB 750 mg  Status:  Discontinued     750 mg 100 mL/hr over 90 Minutes Intravenous Every 24 hours 10/26/13 1454 10/29/13 0949   10/26/13 1000  vancomycin (VANCOCIN) IVPB 1000 mg/200 mL premix     1,000 mg 200 mL/hr over 60 Minutes Intravenous  Once 10/26/13 0949 10/26/13 1134   10/26/13 0900  ceFEPIme (MAXIPIME) 2 g in dextrose 5 % 50 mL IVPB     2 g 100 mL/hr over 30 Minutes Intravenous  Once 10/26/13 0852 10/26/13 1027       PHYSICAL EXAM: Vital signs in last 24 hours: Filed Vitals:   10/31/13 2230 11/01/13 0500 11/01/13 0621 11/01/13 0907  BP: 136/96  117/70   Pulse: 94  93   Temp: 98.6 F (37 C)  98.5 F (36.9 C)   TempSrc: Oral  Oral   Resp: 18  18   Height:      Weight:  111.812 kg (246 lb 8 oz)    SpO2: 95%  97% 97%    Weight change: 0.812 kg (1 lb 12.6 oz) Filed Weights   10/30/13 0500 10/31/13 0500 11/01/13 0500  Weight: 111.5 kg (245 lb 13 oz) 111 kg (244 lb 11.4 oz) 111.812 kg (246 lb 8 oz)   Body mass index is 42.29 kg/(m^2).   Gen Exam: Awake and alert with clear speech.   Neck: Supple, No JVD.   Chest: B/L Clear.   CVS: S1 S2 Regular, no murmurs. Abdomen: soft, BS +, non tender, non distended.  Extremities: ++ edema, lower extremities warm to touch. Neurologic: Non Focal.   Skin: No Rash.  Wounds: N/A.    Intake/Output from previous day:  Intake/Output Summary (Last 24 hours) at 11/01/13 1339 Last data filed at 11/01/13 1232  Gross per 24 hour  Intake 1245.34 ml  Output   2500 ml  Net -1254.66 ml     LAB RESULTS: CBC  Recent Labs Lab 10/26/13 0913  10/28/13 0424 10/28/13 1715 10/29/13 0500 10/30/13 0500 11/01/13 0352  WBC 7.3  < > 4.9 4.2 3.7* 3.4* 5.3  HGB 11.4*  < > 9.6* 9.8* 9.7* 10.0* 10.2*  HCT 34.3*  < > 29.0* 30.2*  29.6* 29.9* 29.9*  PLT 67*  < > 43* 47* 57* 74* 120*  MCV 94.0  < > 91.8 93.8 91.6 89.8 89.5  MCH 31.2  < > 30.4 30.4 30.0 30.0 30.5  MCHC 33.2  < > 33.1 32.5 32.8 33.4 34.1  RDW 13.4  < > 14.4 14.6 14.6 14.1 14.2  LYMPHSABS 0.5*  --   --  0.3*  --   --   --   MONOABS 0.6  --   --  0.2  --   --   --   EOSABS 0.0  --   --  0.0  --   --   --  BASOSABS 0.0  --   --  0.0  --   --   --   < > = values in this interval not displayed.  Chemistries   Recent Labs Lab 10/26/13 0913 10/26/13 1356 10/27/13 0335 10/28/13 0424 10/29/13 0746 10/30/13 0500  NA 135*  --  134* 136* 141 142  K 3.9  --  4.2 4.2 4.7 3.8  CL 98  --  101 105 110 110  CO2 25  --  16* 21 22 25   GLUCOSE 101*  --  157* 148* 91 74  BUN 17  --  21 27* 37* 36*  CREATININE 1.77*  --  1.56* 1.01 1.04 1.10  CALCIUM 8.6  --  7.3* 7.8* 8.3* 8.3*  MG  --  1.4*  --  1.9  --   --     CBG:  Recent Labs Lab 10/30/13 0428 10/30/13 0846 10/30/13 0917 10/30/13 1200 10/30/13 1551  GLUCAP 76 50* 85 58* 79    GFR Estimated Creatinine Clearance: 69.9 ml/min (by C-G formula based on Cr of 1.1).  Coagulation profile  Recent Labs Lab 10/26/13 1356 10/27/13 1130 10/28/13 1150 10/29/13 0800  INR 4.04* 3.50*  3.43* 3.46* 2.01*    Cardiac Enzymes No results found for this basename: CK, CKMB, TROPONINI, MYOGLOBIN,  in the last 168 hours  No components found with this basename: POCBNP,  No results found for this basename: DDIMER,  in the last 72 hours No results found for this basename: HGBA1C,  in the last 72 hours No results found for this basename: CHOL, HDL, LDLCALC, TRIG, CHOLHDL, LDLDIRECT,  in the last 72 hours No results found for this basename: TSH, T4TOTAL, FREET3, T3FREE, THYROIDAB,  in the last 72 hours No results found for this basename: VITAMINB12, FOLATE, FERRITIN, TIBC, IRON, RETICCTPCT,  in the last 72 hours No results found for this basename: LIPASE, AMYLASE,  in the last 72 hours  Urine Studies No  results found for this basename: UACOL, UAPR, USPG, UPH, UTP, UGL, UKET, UBIL, UHGB, UNIT, UROB, ULEU, UEPI, UWBC, URBC, UBAC, CAST, CRYS, UCOM, BILUA,  in the last 72 hours  MICROBIOLOGY: Recent Results (from the past 240 hour(s))  CULTURE, BLOOD (ROUTINE X 2)     Status: None   Collection Time    10/26/13  9:13 AM      Result Value Ref Range Status   Specimen Description BLOOD RIGHT FOREARM   Final   Special Requests BOTTLES DRAWN AEROBIC AND ANAEROBIC 5 CC   Final   Culture  Setup Time     Final   Value: 10/26/2013 18:21     Performed at [facility]   Culture     Final   Value: PSEUDOMONAS AERUGINOSA     Note: Gram Stain Report Called to,Read Back By and Verified With: CHRISTIAN@10 :17AM ON 01/27/13 BY DANTS     Performed at [facility]   Report Status 10/29/2013 FINAL   Final   Organism ID, Bacteria PSEUDOMONAS AERUGINOSA   Final  URINE CULTURE     Status: None   Collection Time    10/26/13  9:30 AM      Result Value Ref Range Status   Specimen Description URINE, CATHETERIZED   Final   Special Requests NONE   Final   Culture  Setup Time     Final   Value: 10/26/2013 17:48     Performed at [place]     Final  Value: NO GROWTH     Performed at [facility]     Final   Value: NO GROWTH     Performed at [facility]   Report Status 10/27/2013 FINAL   Final  CULTURE, BLOOD (ROUTINE X 2)     Status: None   Collection Time    10/26/13 10:05 AM      Result Value Ref Range Status   Specimen Description BLOOD RIGHT HAND   Final   Special Requests BOTTLES DRAWN AEROBIC ONLY 10CC   Final   Culture  Setup Time     Final   Value: 10/26/2013 18:21     Performed at [facility]   Culture     Final   Value:        BLOOD CULTURE RECEIVED NO GROWTH TO DATE CULTURE WILL BE HELD FOR 5 DAYS BEFORE ISSUING A FINAL NEGATIVE REPORT     Performed at [facility]   Report Status PENDING   Incomplete  MRSA  PCR SCREENING     Status: None   Collection Time    10/26/13  1:31 PM      Result Value Ref Range Status   MRSA by PCR NEGATIVE  NEGATIVE Final   Comment:            The GeneXpert MRSA Assay (FDA     approved for NASAL specimens     only), is one component of a     comprehensive MRSA colonization     surveillance program. It is not     intended to diagnose MRSA     infection nor to guide or     monitor treatment for     MRSA infections.  CULTURE, RESPIRATORY (NON-EXPECTORATED)     Status: None   Collection Time    10/26/13  2:03 PM      Result Value Ref Range Status   Specimen Description TRACHEAL ASPIRATE   Final   Special Requests NONE   Final   Gram Stain     Final   Value: MODERATE WBC PRESENT, PREDOMINANTLY PMN     FEW SQUAMOUS EPITHELIAL CELLS PRESENT     MODERATE GRAM NEGATIVE RODS     FEW GRAM POSITIVE RODS     FEW YEAST WITH PSEUDOHYPHAE   Culture     Final   Value: ABUNDANT PSEUDOMONAS AERUGINOSA     ABUNDANT CANDIDA ALBICANS     Performed at [facility]   Report Status 10/29/2013 FINAL   Final   Organism ID, Bacteria PSEUDOMONAS AERUGINOSA   Final  RESPIRATORY VIRUS PANEL     Status: None   Collection Time    10/27/13  8:57 AM      Result Value Ref Range Status   Source - RVPAN TRACHEAL ASPIRATE   Final   Respiratory Syncytial Virus A NOT DETECTED   Final   Respiratory Syncytial Virus B NOT DETECTED   Final   Influenza A NOT DETECTED   Final   Influenza B NOT DETECTED   Final   Parainfluenza 1 NOT DETECTED   Final   Parainfluenza 2 NOT DETECTED   Final   Parainfluenza 3 NOT DETECTED   Final   Metapneumovirus NOT DETECTED   Final   Rhinovirus NOT DETECTED   Final   Adenovirus NOT DETECTED   Final   Influenza A H1 NOT DETECTED   Final   Influenza A H3 NOT DETECTED   Final  Comment: (NOTE)           Normal Reference Range for each Analyte: NOT DETECTED     Testing performed using the Luminex xTAG Respiratory Viral Panel test     kit.     The  analytical performance characteristics of this assay have been     determined by [facility].  The modifications have not been     cleared or approved by the FDA. This assay has been validated pursuant     to the CLIA regulations and is used for clinical purposes.     Performed at [place], BLOOD (ROUTINE X 2)     Status: None   Collection Time    10/30/13 11:45 AM      Result Value Ref Range Status   Specimen Description BLOOD LEFT HAND   Final   Special Requests BOTTLES DRAWN AEROBIC ONLY 2CC   Final   Culture  Setup Time     Final   Value: 10/30/2013 16:53     Performed at [facility]   Culture     Final   Value:        BLOOD CULTURE RECEIVED NO GROWTH TO DATE CULTURE WILL BE HELD FOR 5 DAYS BEFORE ISSUING A FINAL NEGATIVE REPORT     Performed at [facility]   Report Status PENDING   Incomplete  CULTURE, BLOOD (ROUTINE X 2)     Status: None   Collection Time    10/30/13 12:00 PM      Result Value Ref Range Status   Specimen Description BLOOD RIGHT ANTECUBITAL   Final   Special Requests BOTTLES DRAWN AEROBIC ONLY 4CC   Final   Culture  Setup Time     Final   Value: 10/30/2013 16:53     Performed at [facility]   Culture     Final   Value:        BLOOD CULTURE RECEIVED NO GROWTH TO DATE CULTURE WILL BE HELD FOR 5 DAYS BEFORE ISSUING A FINAL NEGATIVE REPORT     Performed at [facility]   Report Status PENDING   Incomplete    RADIOLOGY STUDIES/RESULTS: Dg Chest 2 View  10/04/2013   CLINICAL DATA:  Right flank pain.  EXAM: CHEST  2 VIEW  COMPARISON:  Chest radiograph performed 07/14/2013, and CT of the abdomen and pelvis performed earlier today at 1:40 a.m.  FINDINGS: The lungs are hypoexpanded. Minimal bibasilar opacities are better characterized on recent CT of the abdomen and pelvis, and raise concern for mild pneumonia. There is no evidence of pleural effusion or pneumothorax.  The heart is normal in size; the  mediastinal contour is within normal limits. No acute osseous abnormalities are seen.  IMPRESSION: Lungs hypoexpanded. Minimal bibasilar opacities, better characterized on recent CT of the abdomen and pelvis, raise concern for mild pneumonia.   Electronically Signed   By: Garald Balding M.D.   On: 10/04/2013 03:53   Ct Head Wo Contrast  10/26/2013   CLINICAL DATA:  Altered mental status, fever, head and neck pain, Initial visit  EXAM: CT HEAD WITHOUT CONTRAST  CT CERVICAL SPINE WITHOUT CONTRAST  TECHNIQUE: Multidetector CT imaging of the head and cervical spine was performed following the standard protocol without intravenous contrast. Multiplanar CT image reconstructions of the cervical spine were also generated.  COMPARISON:  05/21/2011, 04/23/2011  FINDINGS: CT HEAD FINDINGS  Mild diffuse cortical atrophy stable from prior study. No  evidence of vascular territory infarct, hemorrhage, or extra-axial fluid. No evidence of mass or hydrocephalus. Calvarium is intact.  CT CERVICAL SPINE FINDINGS  Endotracheal tube and orogastric tube identified. Significant right upper lobe consolidation partially visualized consistent with known right upper lobe pneumonia.  Cervical spine demonstrates normal alignment with no prevertebral soft tissue swelling and no fracture. Mild C5-6 and C6-7 degenerative disc disease.  IMPRESSION: No acute intracranial abnormality. No acute findings involving the cervical spine. Extensive right upper lobe consolidation consistent with known diagnosis of pneumonia.   Electronically Signed   By: Skipper Cliche M.D.   On: 10/26/2013 18:35   Ct Angio Chest Pe W/cm &/or Wo Cm  10/08/2013   CLINICAL DATA:  Hypoxia  EXAM: CT ANGIOGRAPHY CHEST WITH CONTRAST  TECHNIQUE: Multidetector CT imaging of the chest was performed using the standard protocol during bolus administration of intravenous contrast. Multiplanar CT image reconstructions and MIPs were obtained to evaluate the vascular anatomy.   CONTRAST:  19m OMNIPAQUE IOHEXOL 350 MG/ML SOLN  COMPARISON:  Chest CT October 21, 2012 and chest radiograph October 04, 2013  FINDINGS: There is pulmonary embolus arising from the distal aspect of the right main pulmonary artery extending into multiple right upper and lower lobe branches. There is a small pulmonary embolus in a peripheral branch of the left lower lobe pulmonary artery. The right ventricle to left ventricular ratio is 0.85, slightly less than the threshold for right heart strain.  There is no appreciable thoracic aortic dissection. The ascending thoracic aorta is borderline prominent at 4.0 x 3.8 cm. There is no frank aneurysm.  There is consolidation with volume loss in the left lower lobe. There is patchy infiltrate in the medial segment of the right middle lobe inferiorly as well as in the anterior segment of the right upper lobe. More subtle patchy infiltrate is noted in the superior segment of the right lower lobe.  On axial slice 45 series 4417 there is a 5 mm nodular opacity in the anterior segment of the right upper lobe. On axial slice 52 series 4408 there is a 3 mm nodular opacity in the lateral segment of the right middle lobe.  There is no appreciable thoracic adenopathy. The pericardium is not thickened.  In the visualized upper abdomen, the contour of the liver suggests underlying cirrhosis.  There are no blastic or lytic bone lesions. Visualized thyroid appears normal.  Review of the MIP images confirms the above findings.  IMPRESSION: Extensive pulmonary embolus on the right. Small peripheral pulmonary embolus on the left. The patient has right ventricle to left ventricle diameter ratio does not meet criteria for frank right heart strain.  Multiple areas of infiltrate with extensive consolidation in the left lower lobe. Nodular opacities, largest measuring 5 mm as noted above. Followup of these nodular opacity should be based on Fleischner Society guidelines. If the patient is  at high risk for bronchogenic carcinoma, follow-up chest CT at 6-12 months is recommended. If the patient is at low risk for bronchogenic carcinoma, follow-up chest CT at 12 months is recommended. This recommendation follows the consensus statement: Guidelines for Management of Small Pulmonary Nodules Detected on CT Scans: A Statement from the FRadissonas published in Radiology 2005;237:395-400.  The appearance of the liver suggests underlying cirrhosis.  Critical Value/emergent results were called by telephone at the time of interpretation on 10/08/2013 at 12:11 pm to Dr. BNiel Hummer, who verbally acknowledged these results.   Electronically Signed   By: WGwyndolyn Saxon  Jasmine December M.D.   On: 10/08/2013 12:12   Ct Cervical Spine Wo Contrast  10/26/2013   CLINICAL DATA:  Altered mental status, fever, head and neck pain, Initial visit  EXAM: CT HEAD WITHOUT CONTRAST  CT CERVICAL SPINE WITHOUT CONTRAST  TECHNIQUE: Multidetector CT imaging of the head and cervical spine was performed following the standard protocol without intravenous contrast. Multiplanar CT image reconstructions of the cervical spine were also generated.  COMPARISON:  05/21/2011, 04/23/2011  FINDINGS: CT HEAD FINDINGS  Mild diffuse cortical atrophy stable from prior study. No evidence of vascular territory infarct, hemorrhage, or extra-axial fluid. No evidence of mass or hydrocephalus. Calvarium is intact.  CT CERVICAL SPINE FINDINGS  Endotracheal tube and orogastric tube identified. Significant right upper lobe consolidation partially visualized consistent with known right upper lobe pneumonia.  Cervical spine demonstrates normal alignment with no prevertebral soft tissue swelling and no fracture. Mild C5-6 and C6-7 degenerative disc disease.  IMPRESSION: No acute intracranial abnormality. No acute findings involving the cervical spine. Extensive right upper lobe consolidation consistent with known diagnosis of pneumonia.   Electronically  Signed   By: Skipper Cliche M.D.   On: 10/26/2013 18:35   Dg Chest Port 1 View  10/29/2013   CLINICAL DATA:  Shortness of breath.  Respiratory failure.  EXAM: PORTABLE CHEST - 1 VIEW  COMPARISON:  10/26/2013  FINDINGS: The ET tube tip is above the carina. There is a nasogastric tube with tip in the stomach. Left IJ catheter is noted with tip in the projection of the SVC. The lung volumes appear low. There is pulmonary vascular congestion and bibasilar atelectasis, left greater than right. Right upper lobe opacity is again noted and appears unchanged.  IMPRESSION: 1. Low lung volumes and bibasilar atelectasis. 2. No change in aeration to the right upper lobe.   Electronically Signed   By: Kerby Moors M.D.   On: 10/29/2013 07:55   Dg Chest Portable 1 View  10/26/2013   CLINICAL DATA:  Post intubation, [facility] in [facility] 10/26/2013, fever of 104 degrees, hypotensive, followup pneumonia  EXAM: PORTABLE CHEST - 1 VIEW  COMPARISON:  10/26/2013  FINDINGS: Endotracheal tube tip is 2.2 cm above the carina. No change in left central line.  Right upper lobe infiltrate stable. Areas of discoid atelectasis left mid to lower lung zones similar to prior study. Heart size normal.  IMPRESSION: Endotracheal tube as described. Stable right upper lobe opacity suggesting pneumonia.   Electronically Signed   By: Skipper Cliche M.D.   On: 10/26/2013 12:47   Dg Chest Portable 1 View  10/26/2013   CLINICAL DATA:  Central line placement subsequent encounter.  EXAM: PORTABLE CHEST - 1 VIEW  COMPARISON:  10/26/2013; 10/10/2013  FINDINGS: Grossly unchanged borderline enlarged cardiac silhouette and mediastinal contours given persistently reduced lung volumes and patient rotation. Interval placement of a left jugular approach intravenous catheter with tip projected over the superior SVC. No pneumothorax. Heterogeneous and consolidative opacities within the right upper long have worsened in the interval.  Grossly unchanged bilateral medial basilar heterogeneous opacities, left greater than right. No pleural effusion. Pulmonary venous congestion without frank evidence of edema. Unchanged bones.  IMPRESSION: 1. Interval placement of left jugular approach central venous catheter with tip projected over the superior cavoatrial junction. No pneumothorax. 2. Worsening right upper lobe heterogeneous/consolidative opacities worrisome for infection. 3. Unchanged bilateral medial basilar heterogeneous opacities, atelectasis versus infiltrate.   Electronically Signed   By: Eldridge Abrahams.D.  On: 10/26/2013 11:36   Dg Chest Port 1 View  10/26/2013   CLINICAL DATA:  Altered mental status. Increased shortness of breath. Decreased oxygen saturation. History of COPD and pneumonia. Initial encounter.  EXAM: PORTABLE CHEST - 1 VIEW  COMPARISON:  10/10/2013; 10/04/2013; chest CT - 10/08/2013  FINDINGS: The examination is degraded due to patient body habitus, portable technique and hypoventilation.  Grossly unchanged enlarged cardiac silhouette and mediastinal contours given kyphotic projection. Grossly unchanged mild tortuosity and ectasia of the thoracic aorta. Lung volumes remain reduced with worsening bibasilar heterogeneous opacities. Worsening heterogeneous opacities with the right upper lung. No pleural effusion or pneumothorax. No evidence of edema. No acute osseus abnormalities.  IMPRESSION: Right upper and bilateral lower lung heterogeneous airspace opacities worrisome for multifocal infection on this hypoventilated, portable and kyphotic examination. A follow-up chest radiograph in 4 to 6 weeks after treatment is recommended to ensure resolution.   Electronically Signed   By: Sandi Mariscal M.D.   On: 10/26/2013 08:58   Dg Chest Port 1 View  10/10/2013   CLINICAL DATA:  Short of breath  EXAM: PORTABLE CHEST - 1 VIEW  COMPARISON:  10/09/2013  FINDINGS: Mild left lower lobe atelectasis unchanged. Improvement in right lower  lobe atelectasis.  Negative for heart failure or edema.  Negative for effusion.  IMPRESSION: Persistent left lower lobe atelectasis. Improvement in right lower lobe atelectasis. No other new findings.   Electronically Signed   By: Franchot Gallo M.D.   On: 10/10/2013 11:47   Dg Chest Port 1 View  10/09/2013   CLINICAL DATA:  Cough congestion and shortness of breath. And respiratory failure. History of pulmonary embolism, pneumonia, COPD and tobacco use  EXAM: PORTABLE CHEST - 1 VIEW  COMPARISON:  PA and lateral chest x-rays of October 04, 2013  FINDINGS: The lungs are adequately inflated. There is subtle increased density that projects over the left lower hemithorax and just above the right hemidiaphragm. The heart and pulmonary vascularity are normal. There is mild tortuosity of the ascending thoracic aorta. There is no pneumothorax or significant pleural effusion. The observed bony thorax is unremarkable.  IMPRESSION: Hazy airspace disease at the lung bases especially on the left is consistent with atelectasis. There is no alveolar infiltrate nor evidence of CHF.   Electronically Signed   By: David  Martinique   On: 10/09/2013 08:07   Dg Chest Port 1 View  10/04/2013   CLINICAL DATA:  Hypoxia.  EXAM: PORTABLE CHEST - 1 VIEW  COMPARISON:  Chest x-ray 07/14/2013.  FINDINGS: Lung volumes are low. Bibasilar subsegmental atelectasis. No consolidative airspace disease. No pleural effusions. No pneumothorax. No pulmonary nodule or mass noted. Pulmonary vasculature and the cardiomediastinal silhouette are within normal limits.  IMPRESSION: 1. Low lung volumes with probable bibasilar subsegmental atelectasis.   Electronically Signed   By: Vinnie Langton M.D.   On: 10/04/2013 15:39   Dg Abd Portable 1v  10/28/2013   CLINICAL DATA:  Subsequent evaluation of 58 year old female for orogastric tube placement.  EXAM: PORTABLE ABDOMEN - 1 VIEW  COMPARISON:  10/27/2013.  FINDINGS: An enteric tube is present with tip in  the antral pre-pyloric region of the stomach. Visualized bowel gas pattern is nonobstructive.  IMPRESSION: 1. Enteric tube tip is in the antral pre-pyloric region of the stomach.   Electronically Signed   By: Vinnie Langton M.D.   On: 10/28/2013 18:44   Dg Abd Portable 1v  10/27/2013   CLINICAL DATA:  Respiratory failure secondary to pneumonia  with septic shock ; generalized abdominal pain and discomfort, altered mental status ; recent hospitalization; history of COPD, cirrhosis and chronic renal insufficiency  EXAM: PORTABLE ABDOMEN - 1 VIEW  COMPARISON:  Noncontrast CT scan of the abdomen and pelvis of October 04, 2013  FINDINGS: The bowel gas pattern is nonspecific. There is no evidence of a large or small bowel obstruction. The NG tube tip overlies the mid gastric body. There are no abnormal soft tissue calcifications. There is a curvilinear structure that projects just to the right of midline at the L5 level. This may reflect an umbilical ring.  IMPRESSION: The bowel gas pattern is nonspecific. A moderate stool burden is present within the colon which could reflect clinical constipation.   Electronically Signed   By: David  Martinique   On: 10/27/2013 16:41   Dg Abd Portable 1v  10/10/2013   CLINICAL DATA:  Constipation, abdominal pain  EXAM: PORTABLE ABDOMEN - 1 VIEW  COMPARISON:  None.  FINDINGS: There is nonspecific nonobstructive bowel gas pattern. The study is limited by patient's large body habitus. Moderate stool in right colon.  IMPRESSION: Nonspecific nonobstructive bowel gas pattern. Moderate stool in right colon.   Electronically Signed   By: Lahoma Crocker M.D.   On: 10/10/2013 11:58   Ct Renal Stone Study  10/04/2013   CLINICAL DATA:  Right flank pain.  EXAM: CT ABDOMEN AND PELVIS WITHOUT CONTRAST  TECHNIQUE: Multidetector CT imaging of the abdomen and pelvis was performed following the standard protocol without IV contrast.  COMPARISON:  09/16/2003.  FINDINGS: The lung bases demonstrate small  effusions and a right basilar infiltrate. The heart is normal in size. No pericardial effusion  Advanced cirrhotic changes involving the liver without obvious hepatic mass. Gallbladder is normal. No common bile duct dilatation. The pancreas is grossly normal. The spleen is enlarged. No ascites. The adrenal glands are normal. There are bilateral renal calculi but no obstructing ureteral calculi or bladder calculi.  The stomach, duodenum, small bowel and colon are grossly normal without oral contrast. No inflammatory changes, mass lesions or obstructive findings. No mesenteric or retroperitoneal mass or adenopathy. The appendix is normal.  No pelvic mass or adenopathy. The uterus is surgically absent. The bladder is normal. No inguinal mass or adenopathy.  The bony structures are unremarkable.  IMPRESSION: Right basilar infiltrate.  Advanced cirrhotic changes involving the liver without obvious hepatic mass. Mild associated splenomegaly.  Bilateral renal calculi but no obstructing ureteral calculi.   Electronically Signed   By: Kalman Jewels M.D.   On: 10/04/2013 01:55    Oren Binet, MD  [facility] Pager:[phone]  If 7PM-7AM, please contact night-coverage [email or web address] Password [email or web address] 11/01/2013, 1:39 PM   LOS: 6 days

## 2013-11-02 DIAGNOSIS — N183 Chronic kidney disease, stage 3 (moderate): Secondary | ICD-10-CM

## 2013-11-02 DIAGNOSIS — I82409 Acute embolism and thrombosis of unspecified deep veins of unspecified lower extremity: Secondary | ICD-10-CM

## 2013-11-02 MED ORDER — ALPRAZOLAM 1 MG PO TABS: 1.0000 mg | ORAL_TABLET | Freq: Three times a day (TID) | ORAL | Status: DC | PRN

## 2013-11-02 MED ORDER — CIPROFLOXACIN HCL 750 MG PO TABS: 750.0000 mg | ORAL_TABLET | Freq: Two times a day (BID) | ORAL | Status: DC

## 2013-11-02 MED ORDER — TRAMADOL HCL 50 MG PO TABS: 50.0000 mg | ORAL_TABLET | Freq: Two times a day (BID) | ORAL | Status: DC

## 2013-11-02 MED ORDER — SPIRONOLACTONE 50 MG PO TABS: 100.0000 mg | ORAL_TABLET | Freq: Once | ORAL | Status: DC

## 2013-11-02 MED ORDER — RIVAROXABAN 20 MG PO TABS: 20.0000 mg | ORAL_TABLET | Freq: Every day | ORAL | Status: DC

## 2013-11-02 MED ORDER — FUROSEMIDE 20 MG PO TABS: 20.0000 mg | ORAL_TABLET | Freq: Every day | ORAL | Status: DC

## 2013-11-02 NOTE — Discharge Summary (Signed)
PATIENT DETAILS Name: [name] Age: 58 y.o. Sex: female Date of Birth: 13-Nov-1955 MRN: [id number]. Admitting Physician: Chesley Mires, MD ACZ:[id number],SWFUX A, MD  Admit Date: 10/26/2013 Discharge date: 11/02/2013  Recommendations for Outpatient Follow-up:  1. New Medication- ciprofloxacin-stop date 11/09/13 2. Please check CBC and chemistries at next visit 3. Please arrange-followup with primary gastroenterologist and hepatologist [facility]). 4. Note-heparin induced antibody positive-avoid heparin products in the future  PRIMARY DISCHARGE DIAGNOSIS:  Principal Problem:   Septic Shock Active Problems:   Hepatitis B virus infection   Acute hepatitis C virus infection   Iron deficiency anemia   Anxiety state   Coronary atherosclerosis   Hepatic cirrhosis   Cocaine abuse   CKD (chronic kidney disease), stage III   COPD (chronic obstructive pulmonary disease)   PE (pulmonary embolism)   HCAP (healthcare-associated pneumonia)   Altered mental state   DVT (deep venous thrombosis)   Bacteremia      PAST MEDICAL HISTORY: Past Medical History  Diagnosis Date  . Hypertension   . COPD (chronic obstructive pulmonary disease)   . Cirrhosis   . Pulmonary emboli 09/2013  . Renal insufficiency   . Kidney stones   . Heart murmur   . DVT (deep venous thrombosis) 09/2013    RLE  . Pneumonia     "4 times in the past year" (10/30/2013)  . History of blood transfusion     "due to excessive blood loss before hysterectomy"  . GERD (gastroesophageal reflux disease)   . Hepatitis B   . Arthritis     "knees; lower back" (10/30/2013)  . Anxiety   . Depression   . Bipolar disorder     DISCHARGE MEDICATIONS:   Medication List         albuterol 108 (90 BASE) MCG/ACT inhaler  Commonly known as:  PROVENTIL HFA;VENTOLIN HFA  Inhale 2 puffs into the lungs every 6 (six) hours as needed. For shortness of breath     ALPRAZolam 1 MG tablet  Commonly known as:  XANAX  Take 1 tablet (1 mg  total) by mouth 3 (three) times daily as needed. For anxiety     amitriptyline 50 MG tablet  Commonly known as:  ELAVIL  Take 100 mg by mouth at bedtime.     budesonide-formoterol 160-4.5 MCG/ACT inhaler  Commonly known as:  SYMBICORT  Inhale 2 puffs into the lungs 2 (two) times daily.     ciprofloxacin 750 MG tablet  Commonly known as:  CIPRO  Take 1 tablet (750 mg total) by mouth 2 (two) times daily.     entecavir 0.5 MG tablet  Commonly known as:  BARACLUDE  Take 0.5 mg by mouth every other day.     FANAPT 6 MG Tabs  Generic drug:  Iloperidone  Take 12 mg by mouth daily.     furosemide 20 MG tablet  Commonly known as:  LASIX  Take 1 tablet (20 mg total) by mouth daily.     nicotine 7 mg/24hr patch  Commonly known as:  NICODERM CQ - dosed in mg/24 hr  Place 1 patch (7 mg total) onto the skin daily.     omeprazole 40 MG capsule  Commonly known as:  PRILOSEC  Take 80 mg by mouth daily.     polyethylene glycol packet  Commonly known as:  MIRALAX / GLYCOLAX  Take 17 g by mouth 2 (two) times daily.     rivaroxaban 20 MG Tabs tablet  Commonly known as:  Alveda Reasons  Take 1 tablet (20 mg total) by mouth daily with supper.     senna 8.6 MG Tabs tablet  Commonly known as:  SENOKOT  Take 3 tablets (25.8 mg total) by mouth at bedtime.     spironolactone 50 MG tablet  Commonly known as:  ALDACTONE  Take 2 tablets (100 mg total) by mouth once.     tenofovir 300 MG tablet  Commonly known as:  VIREAD  Take 300 mg by mouth every other day.     tiotropium 18 MCG inhalation capsule  Commonly known as:  SPIRIVA  Place 18 mcg into inhaler and inhale daily.     traMADol 50 MG tablet  Commonly known as:  ULTRAM  Take 1 tablet (50 mg total) by mouth 2 (two) times daily.        ALLERGIES:   Allergies  Allergen Reactions  . Heparin Other (See Comments)    HIT ab positive with elevated OD  . Duloxetine Other (See Comments)    REACTION: Headache  . Gabapentin Other (See  Comments)    REACTION: rash in mouth  . Neomycin-Bacitracin Zn-Polymyx Other (See Comments)    REACTION: rash/hives    BRIEF HPI:  See H&P, Labs, Consult and Test reports for all details in brief, 58 yo WF discharged 9/22 from [place] to SNF with discharge dx of: CAP, HTN, Cirrhosis, CKD, COPD, PE, Tibacco abuse. She presented to [facility] ED 10/4 after being found on floor in SNF covered in feces, fever 104 and hypotensive. IN ED she has refractory hypotension despite levoiphed , 3 litres of IVF. PCCM admitted to ICU for septic shock secondary to PNA .  CONSULTATIONS:   pulmonary/intensive care  PERTINENT RADIOLOGIC STUDIES: Dg Chest 2 View  10/04/2013   CLINICAL DATA:  Right flank pain.  EXAM: CHEST  2 VIEW  COMPARISON:  Chest radiograph performed 07/14/2013, and CT of the abdomen and pelvis performed earlier today at 1:40 a.m.  FINDINGS: The lungs are hypoexpanded. Minimal bibasilar opacities are better characterized on recent CT of the abdomen and pelvis, and raise concern for mild pneumonia. There is no evidence of pleural effusion or pneumothorax.  The heart is normal in size; the mediastinal contour is within normal limits. No acute osseous abnormalities are seen.  IMPRESSION: Lungs hypoexpanded. Minimal bibasilar opacities, better characterized on recent CT of the abdomen and pelvis, raise concern for mild pneumonia.   Electronically Signed   By: Garald Balding M.D.   On: 10/04/2013 03:53   Ct Head Wo Contrast  10/26/2013   CLINICAL DATA:  Altered mental status, fever, head and neck pain, Initial visit  EXAM: CT HEAD WITHOUT CONTRAST  CT CERVICAL SPINE WITHOUT CONTRAST  TECHNIQUE: Multidetector CT imaging of the head and cervical spine was performed following the standard protocol without intravenous contrast. Multiplanar CT image reconstructions of the cervical spine were also generated.  COMPARISON:  05/21/2011, 04/23/2011  FINDINGS: CT HEAD FINDINGS  Mild diffuse cortical atrophy stable  from prior study. No evidence of vascular territory infarct, hemorrhage, or extra-axial fluid. No evidence of mass or hydrocephalus. Calvarium is intact.  CT CERVICAL SPINE FINDINGS  Endotracheal tube and orogastric tube identified. Significant right upper lobe consolidation partially visualized consistent with known right upper lobe pneumonia.  Cervical spine demonstrates normal alignment with no prevertebral soft tissue swelling and no fracture. Mild C5-6 and C6-7 degenerative disc disease.  IMPRESSION: No acute intracranial abnormality. No acute findings involving the cervical spine. Extensive right upper lobe consolidation consistent  with known diagnosis of pneumonia.   Electronically Signed   By: Skipper Cliche M.D.   On: 10/26/2013 18:35   Ct Angio Chest Pe W/cm &/or Wo Cm  10/08/2013   CLINICAL DATA:  Hypoxia  EXAM: CT ANGIOGRAPHY CHEST WITH CONTRAST  TECHNIQUE: Multidetector CT imaging of the chest was performed using the standard protocol during bolus administration of intravenous contrast. Multiplanar CT image reconstructions and MIPs were obtained to evaluate the vascular anatomy.  CONTRAST:  44m OMNIPAQUE IOHEXOL 350 MG/ML SOLN  COMPARISON:  Chest CT October 21, 2012 and chest radiograph October 04, 2013  FINDINGS: There is pulmonary embolus arising from the distal aspect of the right main pulmonary artery extending into multiple right upper and lower lobe branches. There is a small pulmonary embolus in a peripheral branch of the left lower lobe pulmonary artery. The right ventricle to left ventricular ratio is 0.85, slightly less than the threshold for right heart strain.  There is no appreciable thoracic aortic dissection. The ascending thoracic aorta is borderline prominent at 4.0 x 3.8 cm. There is no frank aneurysm.  There is consolidation with volume loss in the left lower lobe. There is patchy infiltrate in the medial segment of the right middle lobe inferiorly as well as in the anterior  segment of the right upper lobe. More subtle patchy infiltrate is noted in the superior segment of the right lower lobe.  On axial slice 45 series 4242 there is a 5 mm nodular opacity in the anterior segment of the right upper lobe. On axial slice 52 series 4683 there is a 3 mm nodular opacity in the lateral segment of the right middle lobe.  There is no appreciable thoracic adenopathy. The pericardium is not thickened.  In the visualized upper abdomen, the contour of the liver suggests underlying cirrhosis.  There are no blastic or lytic bone lesions. Visualized thyroid appears normal.  Review of the MIP images confirms the above findings.  IMPRESSION: Extensive pulmonary embolus on the right. Small peripheral pulmonary embolus on the left. The patient has right ventricle to left ventricle diameter ratio does not meet criteria for frank right heart strain.  Multiple areas of infiltrate with extensive consolidation in the left lower lobe. Nodular opacities, largest measuring 5 mm as noted above. Followup of these nodular opacity should be based on Fleischner Society guidelines. If the patient is at high risk for bronchogenic carcinoma, follow-up chest CT at 6-12 months is recommended. If the patient is at low risk for bronchogenic carcinoma, follow-up chest CT at 12 months is recommended. This recommendation follows the consensus statement: Guidelines for Management of Small Pulmonary Nodules Detected on CT Scans: A Statement from the FBerthaas published in Radiology 2005;237:395-400.  The appearance of the liver suggests underlying cirrhosis.  Critical Value/emergent results were called by telephone at the time of interpretation on 10/08/2013 at 12:11 pm to Dr. BNiel Hummer, who verbally acknowledged these results.   Electronically Signed   By: WLowella GripM.D.   On: 10/08/2013 12:12   Ct Cervical Spine Wo Contrast  10/26/2013   CLINICAL DATA:  Altered mental status, fever, head and neck  pain, Initial visit  EXAM: CT HEAD WITHOUT CONTRAST  CT CERVICAL SPINE WITHOUT CONTRAST  TECHNIQUE: Multidetector CT imaging of the head and cervical spine was performed following the standard protocol without intravenous contrast. Multiplanar CT image reconstructions of the cervical spine were also generated.  COMPARISON:  05/21/2011, 04/23/2011  FINDINGS: CT HEAD FINDINGS  Mild diffuse cortical atrophy stable from prior study. No evidence of vascular territory infarct, hemorrhage, or extra-axial fluid. No evidence of mass or hydrocephalus. Calvarium is intact.  CT CERVICAL SPINE FINDINGS  Endotracheal tube and orogastric tube identified. Significant right upper lobe consolidation partially visualized consistent with known right upper lobe pneumonia.  Cervical spine demonstrates normal alignment with no prevertebral soft tissue swelling and no fracture. Mild C5-6 and C6-7 degenerative disc disease.  IMPRESSION: No acute intracranial abnormality. No acute findings involving the cervical spine. Extensive right upper lobe consolidation consistent with known diagnosis of pneumonia.   Electronically Signed   By: Skipper Cliche M.D.   On: 10/26/2013 18:35   Dg Chest Port 1 View  10/29/2013   CLINICAL DATA:  Shortness of breath.  Respiratory failure.  EXAM: PORTABLE CHEST - 1 VIEW  COMPARISON:  10/26/2013  FINDINGS: The ET tube tip is above the carina. There is a nasogastric tube with tip in the stomach. Left IJ catheter is noted with tip in the projection of the SVC. The lung volumes appear low. There is pulmonary vascular congestion and bibasilar atelectasis, left greater than right. Right upper lobe opacity is again noted and appears unchanged.  IMPRESSION: 1. Low lung volumes and bibasilar atelectasis. 2. No change in aeration to the right upper lobe.   Electronically Signed   By: Kerby Moors M.D.   On: 10/29/2013 07:55   Dg Chest Portable 1 View  10/26/2013   CLINICAL DATA:  Post intubation, [facility] 10/26/2013, fever of 104 degrees, hypotensive, followup pneumonia  EXAM: PORTABLE CHEST - 1 VIEW  COMPARISON:  10/26/2013  FINDINGS: Endotracheal tube tip is 2.2 cm above the carina. No change in left central line.  Right upper lobe infiltrate stable. Areas of discoid atelectasis left mid to lower lung zones similar to prior study. Heart size normal.  IMPRESSION: Endotracheal tube as described. Stable right upper lobe opacity suggesting pneumonia.   Electronically Signed   By: Skipper Cliche M.D.   On: 10/26/2013 12:47   Dg Chest Portable 1 View  10/26/2013   CLINICAL DATA:  Central line placement subsequent encounter.  EXAM: PORTABLE CHEST - 1 VIEW  COMPARISON:  10/26/2013; 10/10/2013  FINDINGS: Grossly unchanged borderline enlarged cardiac silhouette and mediastinal contours given persistently reduced lung volumes and patient rotation. Interval placement of a left jugular approach intravenous catheter with tip projected over the superior SVC. No pneumothorax. Heterogeneous and consolidative opacities within the right upper long have worsened in the interval. Grossly unchanged bilateral medial basilar heterogeneous opacities, left greater than right. No pleural effusion. Pulmonary venous congestion without frank evidence of edema. Unchanged bones.  IMPRESSION: 1. Interval placement of left jugular approach central venous catheter with tip projected over the superior cavoatrial junction. No pneumothorax. 2. Worsening right upper lobe heterogeneous/consolidative opacities worrisome for infection. 3. Unchanged bilateral medial basilar heterogeneous opacities, atelectasis versus infiltrate.   Electronically Signed   By: Sandi Mariscal M.D.   On: 10/26/2013 11:36   Dg Chest Port 1 View  10/26/2013   CLINICAL DATA:  Altered mental status. Increased shortness of breath. Decreased oxygen saturation. History of COPD and pneumonia. Initial encounter.  EXAM: PORTABLE CHEST - 1 VIEW   COMPARISON:  10/10/2013; 10/04/2013; chest CT - 10/08/2013  FINDINGS: The examination is degraded due to patient body habitus, portable technique and hypoventilation.  Grossly unchanged enlarged cardiac silhouette and mediastinal contours given kyphotic projection. Grossly unchanged mild tortuosity and ectasia of  the thoracic aorta. Lung volumes remain reduced with worsening bibasilar heterogeneous opacities. Worsening heterogeneous opacities with the right upper lung. No pleural effusion or pneumothorax. No evidence of edema. No acute osseus abnormalities.  IMPRESSION: Right upper and bilateral lower lung heterogeneous airspace opacities worrisome for multifocal infection on this hypoventilated, portable and kyphotic examination. A follow-up chest radiograph in 4 to 6 weeks after treatment is recommended to ensure resolution.   Electronically Signed   By: Sandi Mariscal M.D.   On: 10/26/2013 08:58   Dg Chest Port 1 View  10/10/2013   CLINICAL DATA:  Short of breath  EXAM: PORTABLE CHEST - 1 VIEW  COMPARISON:  10/09/2013  FINDINGS: Mild left lower lobe atelectasis unchanged. Improvement in right lower lobe atelectasis.  Negative for heart failure or edema.  Negative for effusion.  IMPRESSION: Persistent left lower lobe atelectasis. Improvement in right lower lobe atelectasis. No other new findings.   Electronically Signed   By: Franchot Gallo M.D.   On: 10/10/2013 11:47   Dg Chest Port 1 View  10/09/2013   CLINICAL DATA:  Cough congestion and shortness of breath. And respiratory failure. History of pulmonary embolism, pneumonia, COPD and tobacco use  EXAM: PORTABLE CHEST - 1 VIEW  COMPARISON:  PA and lateral chest x-rays of October 04, 2013  FINDINGS: The lungs are adequately inflated. There is subtle increased density that projects over the left lower hemithorax and just above the right hemidiaphragm. The heart and pulmonary vascularity are normal. There is mild tortuosity of the ascending thoracic aorta.  There is no pneumothorax or significant pleural effusion. The observed bony thorax is unremarkable.  IMPRESSION: Hazy airspace disease at the lung bases especially on the left is consistent with atelectasis. There is no alveolar infiltrate nor evidence of CHF.   Electronically Signed   By: David  Martinique   On: 10/09/2013 08:07   Dg Chest Port 1 View  10/04/2013   CLINICAL DATA:  Hypoxia.  EXAM: PORTABLE CHEST - 1 VIEW  COMPARISON:  Chest x-ray 07/14/2013.  FINDINGS: Lung volumes are low. Bibasilar subsegmental atelectasis. No consolidative airspace disease. No pleural effusions. No pneumothorax. No pulmonary nodule or mass noted. Pulmonary vasculature and the cardiomediastinal silhouette are within normal limits.  IMPRESSION: 1. Low lung volumes with probable bibasilar subsegmental atelectasis.   Electronically Signed   By: Vinnie Langton M.D.   On: 10/04/2013 15:39   Dg Abd Portable 1v  10/28/2013   CLINICAL DATA:  Subsequent evaluation of 58 year old female for orogastric tube placement.  EXAM: PORTABLE ABDOMEN - 1 VIEW  COMPARISON:  10/27/2013.  FINDINGS: An enteric tube is present with tip in the antral pre-pyloric region of the stomach. Visualized bowel gas pattern is nonobstructive.  IMPRESSION: 1. Enteric tube tip is in the antral pre-pyloric region of the stomach.   Electronically Signed   By: Vinnie Langton M.D.   On: 10/28/2013 18:44   Dg Abd Portable 1v  10/27/2013   CLINICAL DATA:  Respiratory failure secondary to pneumonia with septic shock ; generalized abdominal pain and discomfort, altered mental status ; recent hospitalization; history of COPD, cirrhosis and chronic renal insufficiency  EXAM: PORTABLE ABDOMEN - 1 VIEW  COMPARISON:  Noncontrast CT scan of the abdomen and pelvis of October 04, 2013  FINDINGS: The bowel gas pattern is nonspecific. There is no evidence of a large or small bowel obstruction. The NG tube tip overlies the mid gastric body. There are no abnormal soft tissue  calcifications. There is a curvilinear  structure that projects just to the right of midline at the L5 level. This may reflect an umbilical ring.  IMPRESSION: The bowel gas pattern is nonspecific. A moderate stool burden is present within the colon which could reflect clinical constipation.   Electronically Signed   By: David  Martinique   On: 10/27/2013 16:41   Dg Abd Portable 1v  10/10/2013   CLINICAL DATA:  Constipation, abdominal pain  EXAM: PORTABLE ABDOMEN - 1 VIEW  COMPARISON:  None.  FINDINGS: There is nonspecific nonobstructive bowel gas pattern. The study is limited by patient's large body habitus. Moderate stool in right colon.  IMPRESSION: Nonspecific nonobstructive bowel gas pattern. Moderate stool in right colon.   Electronically Signed   By: Lahoma Crocker M.D.   On: 10/10/2013 11:58   Ct Renal Stone Study  10/04/2013   CLINICAL DATA:  Right flank pain.  EXAM: CT ABDOMEN AND PELVIS WITHOUT CONTRAST  TECHNIQUE: Multidetector CT imaging of the abdomen and pelvis was performed following the standard protocol without IV contrast.  COMPARISON:  09/16/2003.  FINDINGS: The lung bases demonstrate small effusions and a right basilar infiltrate. The heart is normal in size. No pericardial effusion  Advanced cirrhotic changes involving the liver without obvious hepatic mass. Gallbladder is normal. No common bile duct dilatation. The pancreas is grossly normal. The spleen is enlarged. No ascites. The adrenal glands are normal. There are bilateral renal calculi but no obstructing ureteral calculi or bladder calculi.  The stomach, duodenum, small bowel and colon are grossly normal without oral contrast. No inflammatory changes, mass lesions or obstructive findings. No mesenteric or retroperitoneal mass or adenopathy. The appendix is normal.  No pelvic mass or adenopathy. The uterus is surgically absent. The bladder is normal. No inguinal mass or adenopathy.  The bony structures are unremarkable.  IMPRESSION: Right  basilar infiltrate.  Advanced cirrhotic changes involving the liver without obvious hepatic mass. Mild associated splenomegaly.  Bilateral renal calculi but no obstructing ureteral calculi.   Electronically Signed   By: Kalman Jewels M.D.   On: 10/04/2013 01:55     PERTINENT LAB RESULTS: CBC:  Recent Labs  11/01/13 0352  WBC 5.3  HGB 10.2*  HCT 29.9*  PLT 120*   CMET CMP     Component Value Date/Time   NA 142 10/30/2013 0500   K 3.8 10/30/2013 0500   CL 110 10/30/2013 0500   CO2 25 10/30/2013 0500   GLUCOSE 74 10/30/2013 0500   BUN 36* 10/30/2013 0500   CREATININE 1.10 10/30/2013 0500   CALCIUM 8.3* 10/30/2013 0500   PROT 5.6* 10/28/2013 0424   ALBUMIN 1.9* 10/28/2013 0424   AST 44* 10/28/2013 0424   ALT 43* 10/28/2013 0424   ALKPHOS 64 10/28/2013 0424   BILITOT 0.3 10/28/2013 0424   GFRNONAA 55* 10/30/2013 0500   GFRAA 64* 10/30/2013 0500    GFR Estimated Creatinine Clearance: 69.9 ml/min (by C-G formula based on Cr of 1.1). No results found for this basename: LIPASE, AMYLASE,  in the last 72 hours No results found for this basename: CKTOTAL, CKMB, CKMBINDEX, TROPONINI,  in the last 72 hours No components found with this basename: POCBNP,  No results found for this basename: DDIMER,  in the last 72 hours No results found for this basename: HGBA1C,  in the last 72 hours No results found for this basename: CHOL, HDL, LDLCALC, TRIG, CHOLHDL, LDLDIRECT,  in the last 72 hours No results found for this basename: TSH, T4TOTAL, FREET3, T3FREE, THYROIDAB,  in  the last 72 hours No results found for this basename: VITAMINB12, FOLATE, FERRITIN, TIBC, IRON, RETICCTPCT,  in the last 72 hours Coags: No results found for this basename: PT, INR,  in the last 72 hours Microbiology: Recent Results (from the past 240 hour(s))  CULTURE, BLOOD (ROUTINE X 2)     Status: None   Collection Time    10/26/13  9:13 AM      Result Value Ref Range Status   Specimen Description BLOOD RIGHT FOREARM   Final    Special Requests BOTTLES DRAWN AEROBIC AND ANAEROBIC 5 CC   Final   Culture  Setup Time     Final   Value: 10/26/2013 18:21     Performed at [facility]   Culture     Final   Value: PSEUDOMONAS AERUGINOSA     Note: Gram Stain Report Called to,Read Back By and Verified With: [email or web address] :17AM ON 01/27/13 BY DANTS     Performed at [facility]   Report Status 10/29/2013 FINAL   Final   Organism ID, Bacteria PSEUDOMONAS AERUGINOSA   Final  URINE CULTURE     Status: None   Collection Time    10/26/13  9:30 AM      Result Value Ref Range Status   Specimen Description URINE, CATHETERIZED   Final   Special Requests NONE   Final   Culture  Setup Time     Final   Value: 10/26/2013 17:48     Performed at [place]     Final   Value: NO GROWTH     Performed at [facility]   Culture     Final   Value: NO GROWTH     Performed at [facility]   Report Status 10/27/2013 FINAL   Final  CULTURE, BLOOD (ROUTINE X 2)     Status: None   Collection Time    10/26/13 10:05 AM      Result Value Ref Range Status   Specimen Description BLOOD RIGHT HAND   Final   Special Requests BOTTLES DRAWN AEROBIC ONLY 10CC   Final   Culture  Setup Time     Final   Value: 10/26/2013 18:21     Performed at [facility]   Culture     Final   Value: NO GROWTH 5 DAYS     Note: Culture results may be compromised due to an excessive volume of blood received in culture bottles.     Performed at [facility]   Report Status 11/01/2013 FINAL   Final  MRSA PCR SCREENING     Status: None   Collection Time    10/26/13  1:31 PM      Result Value Ref Range Status   MRSA by PCR NEGATIVE  NEGATIVE Final   Comment:            The GeneXpert MRSA Assay (FDA     approved for NASAL specimens     only), is one component of a     comprehensive MRSA colonization     surveillance program. It is not     intended to diagnose MRSA     infection nor to  guide or     monitor treatment for     MRSA infections.  CULTURE, RESPIRATORY (NON-EXPECTORATED)     Status: None   Collection Time    10/26/13  2:03 PM      Result Value  Ref Range Status   Specimen Description TRACHEAL ASPIRATE   Final   Special Requests NONE   Final   Gram Stain     Final   Value: MODERATE WBC PRESENT, PREDOMINANTLY PMN     FEW SQUAMOUS EPITHELIAL CELLS PRESENT     MODERATE GRAM NEGATIVE RODS     FEW GRAM POSITIVE RODS     FEW YEAST WITH PSEUDOHYPHAE   Culture     Final   Value: ABUNDANT PSEUDOMONAS AERUGINOSA     ABUNDANT CANDIDA ALBICANS     Performed at [facility]   Report Status 10/29/2013 FINAL   Final   Organism ID, Bacteria PSEUDOMONAS AERUGINOSA   Final  RESPIRATORY VIRUS PANEL     Status: None   Collection Time    10/27/13  8:57 AM      Result Value Ref Range Status   Source - RVPAN TRACHEAL ASPIRATE   Final   Respiratory Syncytial Virus A NOT DETECTED   Final   Respiratory Syncytial Virus B NOT DETECTED   Final   Influenza A NOT DETECTED   Final   Influenza B NOT DETECTED   Final   Parainfluenza 1 NOT DETECTED   Final   Parainfluenza 2 NOT DETECTED   Final   Parainfluenza 3 NOT DETECTED   Final   Metapneumovirus NOT DETECTED   Final   Rhinovirus NOT DETECTED   Final   Adenovirus NOT DETECTED   Final   Influenza A H1 NOT DETECTED   Final   Influenza A H3 NOT DETECTED   Final   Comment: (NOTE)           Normal Reference Range for each Analyte: NOT DETECTED     Testing performed using the Luminex xTAG Respiratory Viral Panel test     kit.     The analytical performance characteristics of this assay have been     determined by [facility].  The modifications have not been     cleared or approved by the FDA. This assay has been validated pursuant     to the CLIA regulations and is used for clinical purposes.     Performed at [facility], BLOOD (ROUTINE X 2)     Status: None   Collection Time    10/30/13  11:45 AM      Result Value Ref Range Status   Specimen Description BLOOD LEFT HAND   Final   Special Requests BOTTLES DRAWN AEROBIC ONLY 2CC   Final   Culture  Setup Time     Final   Value: 10/30/2013 16:53     Performed at [facility]   Culture     Final   Value:        BLOOD CULTURE RECEIVED NO GROWTH TO DATE CULTURE WILL BE HELD FOR 5 DAYS BEFORE ISSUING A FINAL NEGATIVE REPORT     Performed at [facility]   Report Status PENDING   Incomplete  CULTURE, BLOOD (ROUTINE X 2)     Status: None   Collection Time    10/30/13 12:00 PM      Result Value Ref Range Status   Specimen Description BLOOD RIGHT ANTECUBITAL   Final   Special Requests BOTTLES DRAWN AEROBIC ONLY 4CC   Final   Culture  Setup Time     Final   Value: 10/30/2013 16:53     Performed at [facility]  Final   Value:        BLOOD CULTURE RECEIVED NO GROWTH TO DATE CULTURE WILL BE HELD FOR 5 DAYS BEFORE ISSUING A FINAL NEGATIVE REPORT     Performed at [facility]   Report Status PENDING   Incomplete     BRIEF HOSPITAL COURSE:  Acute Respiratory Failure  - Secondary to HCAP and PE (see below)  - Intubated from 10/4 - 10/7  -doing well-now on room air and ambulating in hallway  Active Problems:  Septic Shock  - Secondary to HCAP/PE and Pseudomonal bacteremia (see below)  - Resolved. Tapered off hydrocortisone -BP stable-tolerating Spirinolactone  Pseudomonal Bacteremia  -Positive blood cultures on 10/4-has had numerous broad-spectrum antibiotics since admission, currently on Cipro-stop date 11/09/13.Doing well, afebrile, no leukocytosis. Repeat blood cultures on 10/30/13  negative to date.Transthoracic echocardiogram on 9/16 shows no significant valvular abnormalities.   HCAP  -Seen on x-ray on admission, on broad-spectrum antibiotics since admission including vancomycin, cefepime- given pseudomonal bacteremia-antibiotics now  just tapered to ciprofloxacin-stop date  11/09/13. Doing very well,remains afebrile and without leukocytosis. Non toxic looking, and now requesting discharge  PE with subacute DVT  - Continue with Xarelto, while intubated was on an Argatoban   Acute thrombocytopenia  - Secondary to sepsis and heparin-induced thrombocytopenia  - Currently on Xarelto, previously Argatoban  -Platelet count rebounding -by day of discharge upto 120K (lowest at 43 K)  Acute encephalopathy  - Secondary to sepsis  - Resolved with supportive care, completely awake alert and back to her usual baseline   Acute on chronic kidney disease stage III  - Acute renal failure-resolved with supportive care.   Cirrhosis  - Now with significant lower extremity edema- restarted Aldactone- blood pressure tolerating well, will  add low-dose Lasix  -Follows with transplant Hepatology at [facility] is aware she needs to call and make appointment  Hepatitis B  - Continue with antiretrovirals. Follows with [facility] at [facility] is aware she needs to call and make appointment  COPD  - Lungs clear-appear stable-continue with Spiriva/Symbicort   Anxiety  - When necessary Xanax   TODAY-DAY OF DISCHARGE:  Subjective:   Taegen Kemnitz today has no headache,no chest abdominal pain,no new weakness tingling or numbness, feels much better wants to go home today.   Objective:   Blood pressure 135/85, pulse 77, temperature 99.4 F (37.4 C), temperature source Oral, resp. rate 18, height _0  (1.626 m), weight 111.812 kg (246 lb 8 oz), SpO2 94.00%.  Intake/Output Summary (Last 24 hours) at 11/02/13 1011 Last data filed at 11/02/13 0739  Gross per 24 hour  Intake    360 ml  Output   3400 ml  Net  -3040 ml   Filed Weights   10/30/13 0500 10/31/13 0500 11/01/13 0500  Weight: 111.5 kg (245 lb 13 oz) 111 kg (244 lb 11.4 oz) 111.812 kg (246 lb 8 oz)    Exam Awake Alert, Oriented *3, No new F.N deficits, Normal affect [place].AT,PERRAL Supple Neck,No JVD, No  cervical lymphadenopathy appriciated.  Symmetrical Chest wall movement, Good air movement bilaterally, CTAB RRR,No Gallops,Rubs or new Murmurs, No Parasternal Heave +ve B.Sounds, Abd Soft, Non tender, No organomegaly appriciated, No rebound -guarding or rigidity. No Cyanosis, Clubbing, still with ++ edema, No new Rash or bruise  DISCHARGE CONDITION: Stable  DISPOSITION: Home with home health services  DISCHARGE INSTRUCTIONS:    Activity:  As tolerated with Full fall precautions use walker/cane & assistance as needed  Diet recommendation: Heart Healthy  diet      Discharge Instructions   Call MD for:  extreme fatigue    Complete by:  As directed      Call MD for:  persistant dizziness or light-headedness    Complete by:  As directed      Call MD for:  persistant nausea and vomiting    Complete by:  As directed      Diet - low sodium heart healthy    Complete by:  As directed      Increase activity slowly    Complete by:  As directed            Follow-up Information   Follow up with AVBUERE,EDWIN A, MD. Schedule an appointment as soon as possible for a visit in 1 week.   Specialty:  Internal Medicine   Contact information:   [place] [address] [phone]      Total Time spent on discharge equals 45 minutes.  SignedOren Binet 11/02/2013 10:11 AM

## 2013-11-02 NOTE — Progress Notes (Signed)
Nsg Discharge Note  Admit Date:  10/26/2013 Discharge date: 11/02/2013   [name] to be D/C'd Home per MD order.  AVS completed.  Copy for chart, and copy for patient signed, and dated. Patient/caregiver able to verbalize understanding.  Discharge Medication:   Medication List         albuterol 108 (90 BASE) MCG/ACT inhaler  Commonly known as:  PROVENTIL HFA;VENTOLIN HFA  Inhale 2 puffs into the lungs every 6 (six) hours as needed. For shortness of breath     ALPRAZolam 1 MG tablet  Commonly known as:  XANAX  Take 1 tablet (1 mg total) by mouth 3 (three) times daily as needed. For anxiety     amitriptyline 50 MG tablet  Commonly known as:  ELAVIL  Take 100 mg by mouth at bedtime.     budesonide-formoterol 160-4.5 MCG/ACT inhaler  Commonly known as:  SYMBICORT  Inhale 2 puffs into the lungs 2 (two) times daily.     ciprofloxacin 750 MG tablet  Commonly known as:  CIPRO  Take 1 tablet (750 mg total) by mouth 2 (two) times daily.     entecavir 0.5 MG tablet  Commonly known as:  BARACLUDE  Take 0.5 mg by mouth every other day.     FANAPT 6 MG Tabs  Generic drug:  Iloperidone  Take 12 mg by mouth daily.     furosemide 20 MG tablet  Commonly known as:  LASIX  Take 1 tablet (20 mg total) by mouth daily.     nicotine 7 mg/24hr patch  Commonly known as:  NICODERM CQ - dosed in mg/24 hr  Place 1 patch (7 mg total) onto the skin daily.     omeprazole 40 MG capsule  Commonly known as:  PRILOSEC  Take 80 mg by mouth daily.     polyethylene glycol packet  Commonly known as:  MIRALAX / GLYCOLAX  Take 17 g by mouth 2 (two) times daily.     rivaroxaban 20 MG Tabs tablet  Commonly known as:  XARELTO  Take 1 tablet (20 mg total) by mouth daily with supper.     senna 8.6 MG Tabs tablet  Commonly known as:  SENOKOT  Take 3 tablets (25.8 mg total) by mouth at bedtime.     spironolactone 50 MG tablet  Commonly known as:  ALDACTONE  Take 2 tablets (100 mg total) by mouth  once.     tenofovir 300 MG tablet  Commonly known as:  VIREAD  Take 300 mg by mouth every other day.     tiotropium 18 MCG inhalation capsule  Commonly known as:  SPIRIVA  Place 18 mcg into inhaler and inhale daily.     traMADol 50 MG tablet  Commonly known as:  ULTRAM  Take 1 tablet (50 mg total) by mouth 2 (two) times daily.        Discharge Assessment: Filed Vitals:   11/02/13 0546  BP: 135/85  Pulse: 77  Temp: 99.4 F (37.4 C)  Resp: 18   Skin clean, dry and intact without evidence of skin break down, no evidence of skin tears noted. IV catheter discontinued intact. Site without signs and symptoms of complications - no redness or edema noted at insertion site, patient denies c/o pain - only slight tenderness at site.  Dressing with slight pressure applied.  D/c Instructions-Education: Discharge instructions given to patient/family with verbalized understanding. D/c education completed with patient/family including follow up instructions, medication list, d/c activities limitations if  indicated, with other d/c instructions as indicated by MD - patient able to verbalize understanding, all questions fully answered. Patient instructed to return to ED, call 911, or call MD for any changes in condition.  Patient escorted via [place], and D/C home via private auto.  Sephira Zellman Margaretha Sheffield, RN 11/02/2013 1:01 PM

## 2013-11-02 NOTE — Progress Notes (Signed)
ANTICOAGULATION/ANTIBIOTIC CONSULT NOTE  Pharmacy Consult for Xarelto/Ciprofloxacin Indication: h/o pulmonary embolism; Pseudomonas PNA/Bacteremia  Allergies  Allergen Reactions  . Heparin Other (See Comments)    HIT ab positive with elevated OD  . Duloxetine Other (See Comments)    REACTION: Headache  . Gabapentin Other (See Comments)    REACTION: rash in mouth  . Neomycin-Bacitracin Zn-Polymyx Other (See Comments)    REACTION: rash/hives    Patient Measurements: Height: 5\' 4"  (162.6 cm) Weight: 246 lb 8 oz (111.812 kg) IBW/kg (Calculated) : 54.7  Vital Signs: Temp: 99.4 F (37.4 C) (10/11 0546) Temp Source: Oral (10/11 0546) BP: 135/85 mmHg (10/11 0546) Pulse Rate: 77 (10/11 0546)  Labs:  Recent Labs  11/01/13 0352  HGB 10.2*  HCT 29.9*  PLT 120*    Estimated Creatinine Clearance: 69.9 ml/min (by C-G formula based on Cr of 1.1).   Medical History: Past Medical History  Diagnosis Date  . Hypertension   . COPD (chronic obstructive pulmonary disease)   . Cirrhosis   . Pulmonary emboli 09/2013  . Renal insufficiency   . Kidney stones   . Heart murmur   . DVT (deep venous thrombosis) 09/2013    RLE  . Pneumonia     "4 times in the past year" (10/30/2013)  . History of blood transfusion     "due to excessive blood loss before hysterectomy"  . GERD (gastroesophageal reflux disease)   . Hepatitis B   . Arthritis     "knees; lower back" (10/30/2013)  . Anxiety   . Depression   . Bipolar disorder     Medications:  Scheduled:  . amitriptyline  100 mg Oral QHS  . budesonide-formoterol  2 puff Inhalation BID  . ciprofloxacin  750 mg Oral BID  . entecavir  0.5 mg Oral Daily  . hydrocortisone sod succinate (SOLU-CORTEF) inj  25 mg Intravenous Daily  . Iloperidone  12 mg Oral Daily  . nicotine  7 mg Transdermal Daily  . pantoprazole  40 mg Oral QHS  . rivaroxaban  20 mg Oral Q lunch  . spironolactone  50 mg Oral BID  . tenofovir  300 mg Oral Daily  .  tiotropium  18 mcg Inhalation Daily  . traMADol  50 mg Oral BID    Assessment: 58 yo f admitted on 10/4 with recent hospitalization for PE on 10/08/13.  On Xarelto PTA, received Argatroban for full anticoag/r/o HIT while intubated. Transitioned to pta Xarelto 10/8, continues on Xarelto 20 mg PO qd w/ lunch. Hgb 10.2 stable, plts improved 120, no bleeding documented  Pt continues on ciprofloxacin 750 mg PO BID until 10/18 for pseudomonas bacteremia/pna, abx D#8/14.  Wbc wnl, afeb, SCr improving down to 1.1 (baseline~1.56), CrCl~70, UOP good  10/4 CXR: RUL opacity suggesting PNA 10/7 CXR: no change  Cipro PO 10/8 >> (10/18) Cefepime 10/4>>10/8 Vanc 10/4>>10/6 Levaquin 10/4>> 10/7  10/8 BCx2>>ngtd 10/4 Bld x2>> pseudomonas 10/4 Urine>> negative 10/4 Trach asp>> pseudomonas  Goal of Therapy:  Monitor platelets by anticoagulation protocol: Yes Eradication of infection   Plan:  Continue xarelto 20mg  po qd w/ lunch Continue cipro 750 mg PO BID - stop 10/18 F/u repeat bld cx Monitor renal fxn, plts, clinical progress  Elicia Lamp, PharmD Clinical Pharmacist - Resident Pager [phone] 11/02/2013 9:04 AM

## 2013-11-03 NOTE — Progress Notes (Signed)
Patient's mother called stating insurance would not cover xarelto. Notified care manager. She will send pharmacy a 30 day free card until patient can get prior authorization on medication. Joslyn Hy, MSN, RN, [employer]

## 2013-11-04 NOTE — Progress Notes (Signed)
11/04/2013 0928 NCM spoke to [facility] pharmacist on [place] [place], # [id number], fax [phone]. Pt received 30 day free trial of Xarelto on 11/03/2013. Prior auth [phone] contact no) needed for medication. Will make dc attending aware. Jonnie Finner RN CCM Case Mgmt phone [phone]

## 2013-11-05 LAB — CULTURE, BLOOD (ROUTINE X 2)
CULTURE: NO GROWTH
Culture: NO GROWTH

## 2013-12-08 ENCOUNTER — Inpatient Hospital Stay (HOSPITAL_COMMUNITY)
Admission: EM | Admit: 2013-12-08 | Discharge: 2013-12-16 | DRG: 194 | Disposition: A | Discharge status: Home / Self Care | Payer: Medicare Other | Attending: Internal Medicine | Admitting: Internal Medicine

## 2013-12-08 ENCOUNTER — Emergency Department (HOSPITAL_COMMUNITY): Payer: Medicare Other

## 2013-12-08 ENCOUNTER — Encounter (HOSPITAL_COMMUNITY): Payer: Self-pay | Admitting: Emergency Medicine

## 2013-12-08 DIAGNOSIS — Z86711 Personal history of pulmonary embolism: Secondary | ICD-10-CM | POA: Diagnosis not present

## 2013-12-08 DIAGNOSIS — Z8619 Personal history of other infectious and parasitic diseases: Secondary | ICD-10-CM

## 2013-12-08 DIAGNOSIS — N179 Acute kidney failure, unspecified: Secondary | ICD-10-CM | POA: Diagnosis present

## 2013-12-08 DIAGNOSIS — J984 Other disorders of lung: Secondary | ICD-10-CM

## 2013-12-08 DIAGNOSIS — I10 Essential (primary) hypertension: Secondary | ICD-10-CM | POA: Diagnosis present

## 2013-12-08 DIAGNOSIS — E860 Dehydration: Secondary | ICD-10-CM | POA: Diagnosis present

## 2013-12-08 DIAGNOSIS — B1910 Unspecified viral hepatitis B without hepatic coma: Secondary | ICD-10-CM | POA: Diagnosis present

## 2013-12-08 DIAGNOSIS — K7460 Unspecified cirrhosis of liver: Secondary | ICD-10-CM | POA: Diagnosis present

## 2013-12-08 DIAGNOSIS — I129 Hypertensive chronic kidney disease with stage 1 through stage 4 chronic kidney disease, or unspecified chronic kidney disease: Secondary | ICD-10-CM | POA: Diagnosis present

## 2013-12-08 DIAGNOSIS — J188 Other pneumonia, unspecified organism: Secondary | ICD-10-CM | POA: Diagnosis not present

## 2013-12-08 DIAGNOSIS — R0600 Dyspnea, unspecified: Secondary | ICD-10-CM | POA: Diagnosis not present

## 2013-12-08 DIAGNOSIS — J449 Chronic obstructive pulmonary disease, unspecified: Secondary | ICD-10-CM | POA: Diagnosis present

## 2013-12-08 DIAGNOSIS — F319 Bipolar disorder, unspecified: Secondary | ICD-10-CM | POA: Diagnosis present

## 2013-12-08 DIAGNOSIS — N189 Chronic kidney disease, unspecified: Secondary | ICD-10-CM | POA: Diagnosis present

## 2013-12-08 DIAGNOSIS — Z87891 Personal history of nicotine dependence: Secondary | ICD-10-CM

## 2013-12-08 DIAGNOSIS — Z79899 Other long term (current) drug therapy: Secondary | ICD-10-CM | POA: Diagnosis not present

## 2013-12-08 DIAGNOSIS — R1310 Dysphagia, unspecified: Secondary | ICD-10-CM | POA: Diagnosis present

## 2013-12-08 DIAGNOSIS — K219 Gastro-esophageal reflux disease without esophagitis: Secondary | ICD-10-CM | POA: Diagnosis present

## 2013-12-08 DIAGNOSIS — R079 Chest pain, unspecified: Secondary | ICD-10-CM

## 2013-12-08 DIAGNOSIS — J189 Pneumonia, unspecified organism: Secondary | ICD-10-CM | POA: Insufficient documentation

## 2013-12-08 DIAGNOSIS — Z86718 Personal history of other venous thrombosis and embolism: Secondary | ICD-10-CM | POA: Diagnosis not present

## 2013-12-08 LAB — BASIC METABOLIC PANEL
ANION GAP: 20 — AB (ref 5–15)
BUN: 34 mg/dL — ABNORMAL HIGH (ref 6–23)
CHLORIDE: 93 meq/L — AB (ref 96–112)
CO2: 18 meq/L — AB (ref 19–32)
CREATININE: 1.68 mg/dL — AB (ref 0.50–1.10)
Calcium: 10.1 mg/dL (ref 8.4–10.5)
GFR calc non Af Amer: 33 mL/min — ABNORMAL LOW (ref >90)
GFR, EST AFRICAN AMERICAN: 38 mL/min — AB (ref >90)
Glucose, Bld: 133 mg/dL — ABNORMAL HIGH (ref 70–99)
POTASSIUM: 4 meq/L (ref 3.7–5.3)
SODIUM: 131 meq/L — AB (ref 137–147)

## 2013-12-08 LAB — CBC
HCT: 38.4 % (ref 36.0–46.0)
Hemoglobin: 13 g/dL (ref 12.0–15.0)
MCH: 30.1 pg (ref 26.0–34.0)
MCHC: 33.9 g/dL (ref 30.0–36.0)
MCV: 88.9 fL (ref 78.0–100.0)
PLATELETS: 128 10*3/uL — AB (ref 150–400)
RBC: 4.32 MIL/uL (ref 3.87–5.11)
RDW: 13.8 % (ref 11.5–15.5)
WBC: 10.8 10*3/uL — AB (ref 4.0–10.5)

## 2013-12-08 LAB — I-STAT TROPONIN, ED: Troponin i, poc: 0.02 ng/mL (ref 0.00–0.08)

## 2013-12-08 LAB — PRO B NATRIURETIC PEPTIDE: Pro B Natriuretic peptide (BNP): 145.8 pg/mL — ABNORMAL HIGH (ref 0–125)

## 2013-12-08 MED ORDER — RIVAROXABAN 20 MG PO TABS
20.0000 mg | ORAL_TABLET | Freq: Every day | ORAL | Status: DC
  Administered 2013-12-09 – 2013-12-15 (×7): 20 mg via ORAL
  Filled 2013-12-08 (×8): qty 1

## 2013-12-08 MED ORDER — VANCOMYCIN HCL 10 G IV SOLR
1500.0000 mg | INTRAVENOUS | Status: DC
  Administered 2013-12-10 (×2): 1500 mg via INTRAVENOUS
  Filled 2013-12-08 (×2): qty 1500

## 2013-12-08 MED ORDER — TRAMADOL HCL 50 MG PO TABS
50.0000 mg | ORAL_TABLET | Freq: Two times a day (BID) | ORAL | Status: DC
  Administered 2013-12-08 – 2013-12-16 (×16): 50 mg via ORAL
  Filled 2013-12-08 (×16): qty 1

## 2013-12-08 MED ORDER — VITAMIN B-1 100 MG PO TABS
100.0000 mg | ORAL_TABLET | Freq: Every day | ORAL | Status: DC
  Administered 2013-12-09 – 2013-12-16 (×8): 100 mg via ORAL
  Filled 2013-12-08 (×8): qty 1

## 2013-12-08 MED ORDER — VANCOMYCIN HCL 10 G IV SOLR
2000.0000 mg | Freq: Once | INTRAVENOUS | Status: AC
  Administered 2013-12-08: 2000 mg via INTRAVENOUS
  Filled 2013-12-08: qty 2000

## 2013-12-08 MED ORDER — SODIUM CHLORIDE 0.9 % IV SOLN
INTRAVENOUS | Status: DC
  Administered 2013-12-08 – 2013-12-09 (×2): via INTRAVENOUS
  Administered 2013-12-11: 1000 mL via INTRAVENOUS

## 2013-12-08 MED ORDER — ONDANSETRON HCL 4 MG PO TABS: 4.0000 mg | ORAL_TABLET | Freq: Four times a day (QID) | ORAL | Status: DC | PRN

## 2013-12-08 MED ORDER — PIPERACILLIN-TAZOBACTAM 3.375 G IVPB 30 MIN
3.3750 g | Freq: Once | INTRAVENOUS | Status: AC
  Administered 2013-12-08: 3.375 g via INTRAVENOUS
  Filled 2013-12-08: qty 50

## 2013-12-08 MED ORDER — TIOTROPIUM BROMIDE MONOHYDRATE 18 MCG IN CAPS
18.0000 ug | ORAL_CAPSULE | Freq: Every day | RESPIRATORY_TRACT | Status: DC
  Administered 2013-12-09 – 2013-12-16 (×8): 18 ug via RESPIRATORY_TRACT
  Filled 2013-12-08 (×3): qty 5

## 2013-12-08 MED ORDER — ALBUTEROL SULFATE (2.5 MG/3ML) 0.083% IN NEBU: 2.5000 mg | INHALATION_SOLUTION | Freq: Four times a day (QID) | RESPIRATORY_TRACT | Status: DC | PRN

## 2013-12-08 MED ORDER — BUDESONIDE-FORMOTEROL FUMARATE 160-4.5 MCG/ACT IN AERO
2.0000 | INHALATION_SPRAY | Freq: Two times a day (BID) | RESPIRATORY_TRACT | Status: DC
  Administered 2013-12-08 – 2013-12-16 (×16): 2 via RESPIRATORY_TRACT
  Filled 2013-12-08 (×2): qty 6

## 2013-12-08 MED ORDER — IOHEXOL 350 MG/ML SOLN
100.0000 mL | Freq: Once | INTRAVENOUS | Status: AC | PRN
  Administered 2013-12-08: 80 mL via INTRAVENOUS

## 2013-12-08 MED ORDER — FOLIC ACID 1 MG PO TABS
1.0000 mg | ORAL_TABLET | Freq: Every day | ORAL | Status: DC
  Administered 2013-12-09 – 2013-12-16 (×8): 1 mg via ORAL
  Filled 2013-12-08 (×8): qty 1

## 2013-12-08 MED ORDER — FUROSEMIDE 20 MG PO TABS
20.0000 mg | ORAL_TABLET | Freq: Every day | ORAL | Status: DC
  Administered 2013-12-09 – 2013-12-10 (×2): 20 mg via ORAL
  Filled 2013-12-08 (×2): qty 1

## 2013-12-08 MED ORDER — BISACODYL 10 MG RE SUPP
10.0000 mg | Freq: Every day | RECTAL | Status: DC | PRN
  Administered 2013-12-12: 10 mg via RECTAL
  Filled 2013-12-08: qty 1

## 2013-12-08 MED ORDER — PANTOPRAZOLE SODIUM 40 MG PO TBEC
80.0000 mg | DELAYED_RELEASE_TABLET | Freq: Every day | ORAL | Status: DC
  Administered 2013-12-09 – 2013-12-16 (×8): 80 mg via ORAL
  Filled 2013-12-08 (×8): qty 2

## 2013-12-08 MED ORDER — POLYETHYLENE GLYCOL 3350 17 G PO PACK
17.0000 g | PACK | Freq: Every day | ORAL | Status: DC | PRN
  Administered 2013-12-10: 17 g via ORAL
  Filled 2013-12-08 (×2): qty 1

## 2013-12-08 MED ORDER — SPIRONOLACTONE 100 MG PO TABS
100.0000 mg | ORAL_TABLET | Freq: Two times a day (BID) | ORAL | Status: DC
  Administered 2013-12-08 – 2013-12-10 (×4): 100 mg via ORAL
  Filled 2013-12-08 (×5): qty 1

## 2013-12-08 MED ORDER — CLINDAMYCIN PHOSPHATE 600 MG/50ML IV SOLN
600.0000 mg | Freq: Three times a day (TID) | INTRAVENOUS | Status: DC
Start: 2013-12-08 — End: 2013-12-15
  Administered 2013-12-08 – 2013-12-15 (×20): 600 mg via INTRAVENOUS
  Filled 2013-12-08 (×22): qty 50

## 2013-12-08 MED ORDER — CLINDAMYCIN PHOSPHATE 600 MG/50ML IV SOLN: 600.0000 mg | Freq: Once | INTRAVENOUS | Status: DC

## 2013-12-08 MED ORDER — DEXTROSE 5 % IV SOLN
2.0000 g | Freq: Three times a day (TID) | INTRAVENOUS | Status: DC
  Administered 2013-12-09 – 2013-12-15 (×19): 2 g via INTRAVENOUS
  Filled 2013-12-08 (×20): qty 2

## 2013-12-08 MED ORDER — SODIUM CHLORIDE 0.9 % IV BOLUS (SEPSIS)
1000.0000 mL | Freq: Once | INTRAVENOUS | Status: AC
  Administered 2013-12-08: 1000 mL via INTRAVENOUS

## 2013-12-08 MED ORDER — VANCOMYCIN HCL IN DEXTROSE 1-5 GM/200ML-% IV SOLN: 1000.0000 mg | Freq: Once | INTRAVENOUS | Status: DC

## 2013-12-08 MED ORDER — ADULT MULTIVITAMIN W/MINERALS CH
1.0000 | ORAL_TABLET | Freq: Every day | ORAL | Status: DC
  Administered 2013-12-09 – 2013-12-16 (×8): 1 via ORAL
  Filled 2013-12-08 (×8): qty 1

## 2013-12-08 MED ORDER — OXYCODONE HCL 5 MG PO TABS
5.0000 mg | ORAL_TABLET | ORAL | Status: DC | PRN
  Administered 2013-12-08 – 2013-12-16 (×34): 5 mg via ORAL
  Filled 2013-12-08 (×34): qty 1

## 2013-12-08 MED ORDER — SENNA 8.6 MG PO TABS
1.0000 | ORAL_TABLET | Freq: Two times a day (BID) | ORAL | Status: DC
  Administered 2013-12-08 – 2013-12-16 (×16): 8.6 mg via ORAL
  Filled 2013-12-08 (×16): qty 1

## 2013-12-08 MED ORDER — ALPRAZOLAM 1 MG PO TABS
1.0000 mg | ORAL_TABLET | Freq: Three times a day (TID) | ORAL | Status: DC | PRN
  Administered 2013-12-08 – 2013-12-16 (×17): 1 mg via ORAL
  Filled 2013-12-08 (×17): qty 1

## 2013-12-08 MED ORDER — ONDANSETRON HCL 4 MG/2ML IJ SOLN: 4.0000 mg | Freq: Four times a day (QID) | INTRAMUSCULAR | Status: DC | PRN

## 2013-12-08 MED ORDER — TENOFOVIR DISOPROXIL FUMARATE 300 MG PO TABS
300.0000 mg | ORAL_TABLET | ORAL | Status: DC
  Administered 2013-12-10 – 2013-12-12 (×2): 300 mg via ORAL
  Filled 2013-12-08 (×2): qty 1

## 2013-12-08 MED ORDER — ILOPERIDONE 4 MG PO TABS
12.0000 mg | ORAL_TABLET | Freq: Every day | ORAL | Status: DC
  Administered 2013-12-09 – 2013-12-12 (×4): 12 mg via ORAL
  Filled 2013-12-08 (×6): qty 3

## 2013-12-08 MED ORDER — ENTECAVIR 0.5 MG PO TABS
0.5000 mg | ORAL_TABLET | ORAL | Status: DC
  Filled 2013-12-08 (×2): qty 1

## 2013-12-08 MED ORDER — CITALOPRAM HYDROBROMIDE 20 MG PO TABS
20.0000 mg | ORAL_TABLET | Freq: Every day | ORAL | Status: DC
  Administered 2013-12-08 – 2013-12-15 (×8): 20 mg via ORAL
  Filled 2013-12-08 (×9): qty 1

## 2013-12-08 NOTE — ED Notes (Signed)
Pt c/o all over chest pain and SOB that started about 1 hour ago. Denies n/v, headache or dizziness.

## 2013-12-08 NOTE — ED Notes (Signed)
Placed patient on Airborne Precautions.
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
## 2013-12-08 NOTE — ED Notes (Signed)
Patient using cell phone when entering the room. Pt appears in no respiratory distress.

## 2013-12-08 NOTE — ED Provider Notes (Signed)
CSN: [id number]     Arrival date & time 12/08/13  1657 History   First MD Initiated Contact with Patient 12/08/13 1851     Chief Complaint  Patient presents with  . Chest Pain  . Shortness of Breath     (Consider location/radiation/quality/duration/timing/severity/associated sxs/prior Treatment) Patient is a 58 y.o. female presenting with shortness of breath.  Shortness of Breath Severity:  Moderate Onset quality:  Gradual Duration:  1 day Timing:  Constant Progression:  Resolved Chronicity:  Recurrent Context comment:  Multiple recent admission for PNA sp abx completion Relieved by:  Nothing Worsened by:  Deep breathing Ineffective treatments:  None tried Associated symptoms: cough   Associated symptoms: no abdominal pain, no chest pain and no fever     Past Medical History  Diagnosis Date  . Hypertension   . COPD (chronic obstructive pulmonary disease)   . Cirrhosis   . Pulmonary emboli 09/2013  . Renal insufficiency   . Kidney stones   . Heart murmur   . DVT (deep venous thrombosis) 09/2013    RLE  . Pneumonia     "4 times in the past year" (10/30/2013)  . History of blood transfusion     "due to excessive blood loss before hysterectomy"  . GERD (gastroesophageal reflux disease)   . Hepatitis B   . Arthritis     "knees; lower back" (10/30/2013)  . Anxiety   . Depression   . Bipolar disorder    Past Surgical History  Procedure Laterality Date  . Carpal tunnel release Left   . Fracture surgery    . Ankle fracture surgery Left   . Abdominal hysterectomy    . Tubal ligation    . Dilation and curettage of uterus  X 2   Family History  Problem Relation Age of Onset  . Stroke Mother   . Lung cancer Father    History  Substance Use Topics  . Smoking status: Former Smoker -- 1.00 packs/day for 41 years    Types: Cigarettes    Quit date: 10/02/2013  . Smokeless tobacco: Never Used  . Alcohol Use: Yes     Comment: "stopped drinking  in 2004"   OB History     No data available     Review of Systems  Constitutional: Negative for fever.  Respiratory: Positive for cough and shortness of breath.   Cardiovascular: Negative for chest pain.  Gastrointestinal: Negative for abdominal pain.  All other systems reviewed and are negative.     Allergies  Elavil; Heparin; Duloxetine; Gabapentin; and Neomycin-bacitracin zn-polymyx  Home Medications   Prior to Admission medications   Medication Sig Start Date End Date Taking? Authorizing Provider  acetaminophen (TYLENOL) 325 MG tablet Take 325 mg by mouth every 6 (six) hours as needed for moderate pain (back & chest pain).   Yes Historical Provider, MD  albuterol (PROVENTIL HFA;VENTOLIN HFA) 108 (90 BASE) MCG/ACT inhaler Inhale 2 puffs into the lungs every 6 (six) hours as needed for wheezing or shortness of breath (shortness of breath & wheezing). For shortness of breath   Yes Historical Provider, MD  ALPRAZolam (XANAX) 1 MG tablet Take 1 tablet (1 mg total) by mouth 3 (three) times daily as needed. For anxiety 11/02/13  Yes Shanker Kristeen Mans, MD  amitriptyline (ELAVIL) 50 MG tablet Take 100 mg by mouth at bedtime.    Yes Historical Provider, MD  budesonide-formoterol (SYMBICORT) 160-4.5 MCG/ACT inhaler Inhale 2 puffs into the lungs 2 (two) times daily.  Yes Historical Provider, MD  citalopram (CELEXA) 20 MG tablet Take 20 mg by mouth at bedtime.   Yes Historical Provider, MD  entecavir (BARACLUDE) 0.5 MG tablet Take 0.5 mg by mouth every other day.    Yes Historical Provider, MD  furosemide (LASIX) 20 MG tablet Take 1 tablet (20 mg total) by mouth daily. 11/02/13  Yes Shanker Kristeen Mans, MD  Iloperidone (FANAPT) 6 MG TABS Take 12 mg by mouth daily.    Yes Historical Provider, MD  nicotine (NICODERM CQ - DOSED IN MG/24 HR) 7 mg/24hr patch Place 1 patch (7 mg total) onto the skin daily. 10/14/13  Yes Belkys A Regalado, MD  omeprazole (PRILOSEC) 40 MG capsule Take 80 mg by mouth daily.    Yes Historical  Provider, MD  polyethylene glycol (MIRALAX / GLYCOLAX) packet Take 17 g by mouth 2 (two) times daily. 10/14/13  Yes Belkys A Regalado, MD  rivaroxaban (XARELTO) 20 MG TABS tablet Take 1 tablet (20 mg total) by mouth daily with supper. 11/02/13  Yes Shanker Kristeen Mans, MD  senna (SENOKOT) 8.6 MG TABS tablet Take 3 tablets (25.8 mg total) by mouth at bedtime. 10/14/13  Yes Belkys A Regalado, MD  spironolactone (ALDACTONE) 50 MG tablet Take 2 tablets (100 mg total) by mouth once. Patient taking differently: Take 100 mg by mouth 2 (two) times daily.  11/02/13  Yes Shanker Kristeen Mans, MD  tenofovir (VIREAD) 300 MG tablet Take 300 mg by mouth every other day.   Yes Historical Provider, MD  tiotropium (SPIRIVA) 18 MCG inhalation capsule Place 18 mcg into inhaler and inhale daily.     Yes Historical Provider, MD  traMADol (ULTRAM) 50 MG tablet Take 1 tablet (50 mg total) by mouth 2 (two) times daily. 11/02/13  Yes Shanker Kristeen Mans, MD  ciprofloxacin (CIPRO) 750 MG tablet Take 1 tablet (750 mg total) by mouth 2 (two) times daily. 11/02/13   Shanker Kristeen Mans, MD   BP 109/74 mmHg  Pulse 66  Temp(Src) 97.8 F (36.6 C) (Oral)  Resp 18  Ht 5\' 4"  (1.626 m)  SpO2 95% Physical Exam  Constitutional: She is oriented to person, place, and time. She appears well-developed and well-nourished.  HENT:  Head: Normocephalic and atraumatic.  Right Ear: External ear normal.  Left Ear: External ear normal.  Eyes: Conjunctivae and EOM are normal. Pupils are equal, round, and reactive to light.  Neck: Normal range of motion. Neck supple.  Cardiovascular: Normal rate, regular rhythm, normal heart sounds and intact distal pulses.   Pulmonary/Chest: Effort normal. She has decreased breath sounds in the right upper field. She has rales in the right lower field and the left lower field.  Abdominal: Soft. Bowel sounds are normal. There is no tenderness.  Musculoskeletal: Normal range of motion.  Neurological: She is alert  and oriented to person, place, and time.  Skin: Skin is warm and dry.  Vitals reviewed.   ED Course  Procedures (including critical care time) Labs Review Labs Reviewed  CBC - Abnormal; Notable for the following:    WBC 10.8 (*)    Platelets 128 (*)    All other components within normal limits  BASIC METABOLIC PANEL - Abnormal; Notable for the following:    Sodium 131 (*)    Chloride 93 (*)    CO2 18 (*)    Glucose, Bld 133 (*)    BUN 34 (*)    Creatinine, Ser 1.68 (*)    GFR calc non Af Wyvonnia Lora  33 (*)    GFR calc Af Amer 38 (*)    Anion gap 20 (*)    All other components within normal limits  PRO B NATRIURETIC PEPTIDE - Abnormal; Notable for the following:    Pro B Natriuretic peptide (BNP) 145.8 (*)    All other components within normal limits  HEMOGLOBIN A1C  CBC  COMPREHENSIVE METABOLIC PANEL  TSH  I-STAT TROPOININ, ED    Imaging Review Dg Chest 2 View  12/08/2013   CLINICAL DATA:  Mid chest pain for 1 day, shortness of breath, COPD, former smoker, hypertension, cirrhosis, GERD, history pulmonary embolism  EXAM: CHEST  2 VIEW  COMPARISON:  10/29/2013  FINDINGS: Normal heart size, mediastinal contours, and pulmonary vascularity.  Persistent RIGHT upper lobe infiltrate/opacity wish question developing area central lucency/cavitation.  Bibasilar subsegmental atelectasis greater on LEFT.  Remaining lungs clear.  No pleural effusion or pneumothorax.  Scattered endplate spur formation thoracic spine.  IMPRESSION: Bibasilar atelectasis greater on LEFT.  Persistent RIGHT upper lobe opacity with questionable area of developing central lucency, cannot exclude cavitary pneumonia ; CT chest with contrast recommended to evaluate.   Electronically Signed   By: Lavonia Dana M.D.   On: 12/08/2013 17:35   Ct Angio Chest Pe W/cm &/or Wo Cm  12/08/2013   CLINICAL DATA:  Initial evaluation for acute chest pain, shortness of breath.  EXAM: CT ANGIOGRAPHY CHEST WITH CONTRAST  TECHNIQUE:  Multidetector CT imaging of the chest was performed using the standard protocol during bolus administration of intravenous contrast. Multiplanar CT image reconstructions and MIPs were obtained to evaluate the vascular anatomy.  CONTRAST:  36mL OMNIPAQUE IOHEXOL 350 MG/ML SOLN  COMPARISON:  Prior radiograph from earlier the same day, as well as prior CT from 10/08/2013.  FINDINGS: Thyroid gland within normal limits.  Right hilar lymph node measures 1.2 cm in short axis. No other pathologically enlarged mediastinal, hilar, or axillary lymph nodes identified.  Intrathoracic aorta is of normal caliber and appearance. Great vessels within normal limits.  Heart size within normal limits. Small amount of pericardial fluid present.  Pulmonary arterial tree is well opacified. No filling defect to suggest acute pulmonary embolism identified. Re-formatted imaging confirms these findings. Main pulmonary artery itself is somewhat dilated up to 3.3 cm.  There is dense somewhat ill-defined opacity within the right upper lobe measuring 3.9 x 4.0 x 4.0 cm (series 12, image 16). There are a few scattered foci of cavitation within this opacity. This is new relative to prior CT. Given the relative large size of this opacity, possible cavitary pneumonia is favored.  Scattered tree-in-bud nodular opacities present more inferiorly within the posterior segment of the right lower lobe, also worrisome for infection (series 12, image 39). Similar nodular tree-in-bud opacity seen within the superior segment of the right lower lobe (series 12, image 40). Patchy bibasilar opacities seen more inferiorly within the lower lobes bilaterally.  No pulmonary edema or pleural effusion.  Nodular contour of the partially visualized liver is suggestive of possible cirrhosis. Visualized upper abdomen is otherwise unremarkable.  No acute osseous abnormality. No worrisome lytic or blastic osseous lesions.  Review of the MIP images confirms the above findings.   IMPRESSION: 1. No CT evidence for acute pulmonary embolism. 2. Dense right upper lobe opacity with central lucency as above, worrisome for cavitary pneumonia. Additional tree-in-bud and patchy opacities within the right upper and bilateral lower lobes also concerning for infection. While an underlying mass lesion is not entirely excluded within the  right upper lobe, this is less favored given the relative rapidity of onset as compared to prior CT from 10/08/2013 as well as the presence of infection on that prior study. Followup to resolution is recommended. 3. 1.2 cm right hilar lymph node, likely reactive in nature. 4. Nodular contour of the liver, suggesting cirrhosis. Finding is stable from prior   Electronically Signed   By: Jeannine Boga M.D.   On: 12/08/2013 20:44     EKG Interpretation   Date/Time:  Monday December 08 2013 17:05:23 EST Ventricular Rate:  102 PR Interval:  131 QRS Duration: 93 QT Interval:  346 QTC Calculation: 451 R Axis:   33 Text Interpretation:  Sinus tachycardia Baseline wander in lead(s) II V5  No significant change since last tracing Confirmed by Debby Freiberg  [id number]) on 12/08/2013 7:05:08 PM      MDM   Final diagnoses:  Dyspnea    58 y.o. female with pertinent PMH of COPD, recurrent PNA, prior DVT and PE presents with 1 day of dyspnea as described above.  Pt checked home O2 which she stated was 80% (pt has thick nail polish on).  No fevers, however pt has had weeks of cough.  On arrival vitals and physical exam as above.  Patient with stable oxygen saturation throughout exam. Chest x-ray obtained and as above remarkable for potentially cavitary right upper lobe pneumonia. Obtained CT scan of chest due to poor characterization on x-ray. This demonstrated only a right upper lobe cavitary infiltrate, no PE. Patient was given Vanc and Zosyn. Consult hospitalist for admission..    1. Chest pain   2. Dyspnea         Debby Freiberg, MD 12/08/13  [phone]

## 2013-12-08 NOTE — Progress Notes (Signed)
ANTIBIOTIC CONSULT NOTE - INITIAL  Pharmacy Consult for Vancomycin and Aztreonam Indication: pneumonia  Allergies  Allergen Reactions  . Elavil [Amitriptyline] Other (See Comments)    Causes her to be very "disoriented".  . Heparin Other (See Comments)    HIT ab positive with elevated OD  . Duloxetine Other (See Comments)    REACTION: Headache  . Gabapentin Other (See Comments)    REACTION: rash in mouth  . Neomycin-Bacitracin Zn-Polymyx Other (See Comments)    REACTION: rash/hives    Patient Measurements:   Height: 64 inches Weight: 111 kg as of 11/01/13  Vital Signs: Temp: 97.7 F (36.5 C) (11/16 2145) Temp Source: Oral (11/16 2145) BP: 117/71 mmHg (11/16 2145) Pulse Rate: 78 (11/16 2145) Intake/Output from previous day:   Intake/Output from this shift:    Labs:  Recent Labs  12/08/13 1705  WBC 10.8*  HGB 13.0  PLT 128*  CREATININE 1.68*   CrCl cannot be calculated (Unknown ideal weight.). No results for input(s): VANCOTROUGH, VANCOPEAK, VANCORANDOM, GENTTROUGH, GENTPEAK, GENTRANDOM, TOBRATROUGH, TOBRAPEAK, TOBRARND, AMIKACINPEAK, AMIKACINTROU, AMIKACIN in the last 72 hours.   Microbiology: No results found for this or any previous visit (from the past 720 hour(s)).  Medical History: Past Medical History  Diagnosis Date  . Hypertension   . COPD (chronic obstructive pulmonary disease)   . Cirrhosis   . Pulmonary emboli 09/2013  . Renal insufficiency   . Kidney stones   . Heart murmur   . DVT (deep venous thrombosis) 09/2013    RLE  . Pneumonia     "4 times in the past year" (10/30/2013)  . History of blood transfusion     "due to excessive blood loss before hysterectomy"  . GERD (gastroesophageal reflux disease)   . Hepatitis B   . Arthritis     "knees; lower back" (10/30/2013)  . Anxiety   . Depression   . Bipolar disorder     Medications:  Scheduled:   Infusions:  . piperacillin-tazobactam 3.375 g (12/08/13 2144)  . vancomycin     PRN:    Assessment: 58 yo female presents with chest pain and shortness of breath. CTa shows dense right upper lobe opacity worrisome for cavitary pneumonia. Pharmacy is consulted to dose vancomycin and aztreonam; patient received a dose of zosyn in the ED.  11/16 >> Vancomycin >> 11/16 >> Aztreonam >>  Temp: 97.7 WBC: 10.8k Renal: SCr 1.68, CrCl 42 ml/min (normalized)  Goal of Therapy:  Vancomycin trough level 15-20 mcg/ml  Aztreonam dose per renal function  Plan:   Vancomycin 2g IV x 1, then 1500mg  IV q24h  Check trough at steady state  Aztreonam 2g IV q8h  Follow up renal function & cultures, clinical course  Peggyann Juba, PharmD, BCPS Pager: [phone] 12/08/2013,9:55 PM

## 2013-12-09 DIAGNOSIS — J438 Other emphysema: Secondary | ICD-10-CM

## 2013-12-09 DIAGNOSIS — I10 Essential (primary) hypertension: Secondary | ICD-10-CM

## 2013-12-09 LAB — COMPREHENSIVE METABOLIC PANEL
ALT: 11 U/L (ref 0–35)
AST: 14 U/L (ref 0–37)
Albumin: 3.2 g/dL — ABNORMAL LOW (ref 3.5–5.2)
Alkaline Phosphatase: 68 U/L (ref 39–117)
Anion gap: 15 (ref 5–15)
BUN: 30 mg/dL — ABNORMAL HIGH (ref 6–23)
CO2: 23 mEq/L (ref 19–32)
Calcium: 9.2 mg/dL (ref 8.4–10.5)
Chloride: 99 mEq/L (ref 96–112)
Creatinine, Ser: 1.42 mg/dL — ABNORMAL HIGH (ref 0.50–1.10)
GFR calc Af Amer: 47 mL/min — ABNORMAL LOW (ref >90)
GFR calc non Af Amer: 40 mL/min — ABNORMAL LOW (ref >90)
Glucose, Bld: 111 mg/dL — ABNORMAL HIGH (ref 70–99)
Potassium: 3.7 mEq/L (ref 3.7–5.3)
Sodium: 137 mEq/L (ref 137–147)
Total Bilirubin: 0.3 mg/dL (ref 0.3–1.2)
Total Protein: 7.1 g/dL (ref 6.0–8.3)

## 2013-12-09 LAB — CBC
HCT: 31.3 % — ABNORMAL LOW (ref 36.0–46.0)
Hemoglobin: 10.5 g/dL — ABNORMAL LOW (ref 12.0–15.0)
MCH: 30.2 pg (ref 26.0–34.0)
MCHC: 33.5 g/dL (ref 30.0–36.0)
MCV: 89.9 fL (ref 78.0–100.0)
PLATELETS: 95 10*3/uL — AB (ref 150–400)
RBC: 3.48 MIL/uL — ABNORMAL LOW (ref 3.87–5.11)
RDW: 14.2 % (ref 11.5–15.5)
WBC: 5.1 10*3/uL (ref 4.0–10.5)

## 2013-12-09 LAB — HEMOGLOBIN A1C
Hgb A1c MFr Bld: 5 % (ref <5.7)
Mean Plasma Glucose: 97 mg/dL (ref <117)

## 2013-12-09 LAB — TSH: TSH: 2.74 u[IU]/mL (ref 0.350–4.500)

## 2013-12-09 LAB — MRSA PCR SCREENING: MRSA by PCR: POSITIVE — AB

## 2013-12-09 MED ORDER — MUPIROCIN 2 % EX OINT
1.0000 "application " | TOPICAL_OINTMENT | Freq: Two times a day (BID) | CUTANEOUS | Status: AC
  Administered 2013-12-09 – 2013-12-13 (×9): 1 via NASAL
  Filled 2013-12-09: qty 22

## 2013-12-09 MED ORDER — ALBUTEROL SULFATE (2.5 MG/3ML) 0.083% IN NEBU
2.5000 mg | INHALATION_SOLUTION | Freq: Three times a day (TID) | RESPIRATORY_TRACT | Status: DC
  Administered 2013-12-09 – 2013-12-12 (×10): 2.5 mg via RESPIRATORY_TRACT
  Filled 2013-12-09 (×10): qty 3

## 2013-12-09 MED ORDER — ENTECAVIR 0.5 MG PO TABS
0.5000 mg | ORAL_TABLET | ORAL | Status: DC
  Administered 2013-12-09 – 2013-12-15 (×4): 0.5 mg via ORAL

## 2013-12-09 MED ORDER — VITAMINS A & D EX OINT
TOPICAL_OINTMENT | CUTANEOUS | Status: AC
  Administered 2013-12-09: 19:00:00
  Filled 2013-12-09: qty 5

## 2013-12-09 MED ORDER — CHLORHEXIDINE GLUCONATE CLOTH 2 % EX PADS
6.0000 | MEDICATED_PAD | Freq: Every day | CUTANEOUS | Status: AC
  Administered 2013-12-10 – 2013-12-14 (×5): 6 via TOPICAL

## 2013-12-09 NOTE — Progress Notes (Signed)
Patient admitted from ED with shortness of breath and chest pain to room 1341. Oriented  to room and unit.

## 2013-12-09 NOTE — Consult Note (Signed)
Name: [name] MRN: [id number] DOB: [birth year]    ADMISSION DATE:  12/08/2013 CONSULTATION DATE:  12/09/13  REFERRING MD :  Dr. Dyann Kief   CHIEF COMPLAINT:  Recurrent PNA  BRIEF PATIENT DESCRIPTION: 58 y/o F, former smoker, admitted 11/16 with reported hx of recurrent PNA.  Most recent d/c 11/02/13.  Since that time has had ongoing cough, congestion, grey sputum production.  CXR concerning for RUL cavitary lesion.  PCCM consulted for evaluation.    SIGNIFICANT EVENTS  10/4-10/11  Admit septic shock, pseudomonal bacteremia, HCAP and PE/DVT (on Xarelto). She was diagnosed HIT POSITIVE during that admission.   TEE on 9/16 was negative for valvular abnormalities.  STUDIES:  11/16  CT Chest >> no PE, dense RUL opacity, worrisome for cavitary PNA, tree-in-bud & patchy opacities RUL, B LL.  New from prior CT in 10/08/13   HISTORY OF PRESENT ILLNESS:  58 y/o F, former smoker, with PMH of HTN, COPD, Hepatitis B/C+ with cirrhosis, PE/DVT (09/2103), GERD, Anxiety / Depression, Bipolar Disorder, & Former Cocaine abuse who was admitted 11/16 with reported hx of recurrent PNA.  Sept 2015 she was admitted for streptococcal PNA.    She was most recently admitted from 10/4-10/11 for septic shock, pseudomonal bacteremia, HCAP and PE/DVT (on Xarelto). She was diagnosed HIT POSITIVE during that admission.   TEE on 9/16 was negative for valvular abnormalities.  Since discharge, she reports she has had ongoing cough, congestion, grey sputum production.  CXR concerning for RUL cavitary lesion.  CTA Chest obtained with new development of dense RUL opacity, worrisome for cavitary PNA, tree-in-bud & patchy opacities RUL, B LL.  PCCM consulted for further evaluation.     PAST MEDICAL HISTORY :   has a past medical history of Hypertension; COPD (chronic obstructive pulmonary disease); Cirrhosis; Pulmonary emboli (09/2013); Renal insufficiency; Kidney stones; Heart murmur; DVT (deep venous thrombosis) (09/2013);  Pneumonia; History of blood transfusion; GERD (gastroesophageal reflux disease); Hepatitis B; Arthritis; Anxiety; Depression; and Bipolar disorder.  has past surgical history that includes Carpal tunnel release (Left); Fracture surgery; Ankle fracture surgery (Left); Abdominal hysterectomy; Tubal ligation; and Dilation and curettage of uterus (X 2).    Prior to Admission medications   Medication Sig Start Date End Date Taking? Authorizing Provider  acetaminophen (TYLENOL) 325 MG tablet Take 325 mg by mouth every 6 (six) hours as needed for moderate pain (back & chest pain).   Yes Historical Provider, MD  albuterol (PROVENTIL HFA;VENTOLIN HFA) 108 (90 BASE) MCG/ACT inhaler Inhale 2 puffs into the lungs every 6 (six) hours as needed for wheezing or shortness of breath (shortness of breath & wheezing). For shortness of breath   Yes Historical Provider, MD  ALPRAZolam (XANAX) 1 MG tablet Take 1 tablet (1 mg total) by mouth 3 (three) times daily as needed. For anxiety 11/02/13  Yes Shanker Kristeen Mans, MD  amitriptyline (ELAVIL) 50 MG tablet Take 100 mg by mouth at bedtime.    Yes Historical Provider, MD  budesonide-formoterol (SYMBICORT) 160-4.5 MCG/ACT inhaler Inhale 2 puffs into the lungs 2 (two) times daily.     Yes Historical Provider, MD  citalopram (CELEXA) 20 MG tablet Take 20 mg by mouth at bedtime.   Yes Historical Provider, MD  entecavir (BARACLUDE) 0.5 MG tablet Take 0.5 mg by mouth every other day.    Yes Historical Provider, MD  furosemide (LASIX) 20 MG tablet Take 1 tablet (20 mg total) by mouth daily. 11/02/13  Yes Shanker Kristeen Mans, MD  Iloperidone (FANAPT) 6  MG TABS Take 12 mg by mouth daily.    Yes Historical Provider, MD  nicotine (NICODERM CQ - DOSED IN MG/24 HR) 7 mg/24hr patch Place 1 patch (7 mg total) onto the skin daily. 10/14/13  Yes Belkys A Regalado, MD  omeprazole (PRILOSEC) 40 MG capsule Take 80 mg by mouth daily.    Yes Historical Provider, MD  polyethylene glycol (MIRALAX /  GLYCOLAX) packet Take 17 g by mouth 2 (two) times daily. 10/14/13  Yes Belkys A Regalado, MD  rivaroxaban (XARELTO) 20 MG TABS tablet Take 1 tablet (20 mg total) by mouth daily with supper. 11/02/13  Yes Shanker Kristeen Mans, MD  senna (SENOKOT) 8.6 MG TABS tablet Take 3 tablets (25.8 mg total) by mouth at bedtime. 10/14/13  Yes Belkys A Regalado, MD  spironolactone (ALDACTONE) 50 MG tablet Take 2 tablets (100 mg total) by mouth once. Patient taking differently: Take 100 mg by mouth 2 (two) times daily.  11/02/13  Yes Shanker Kristeen Mans, MD  tenofovir (VIREAD) 300 MG tablet Take 300 mg by mouth every other day.   Yes Historical Provider, MD  tiotropium (SPIRIVA) 18 MCG inhalation capsule Place 18 mcg into inhaler and inhale daily.     Yes Historical Provider, MD  traMADol (ULTRAM) 50 MG tablet Take 1 tablet (50 mg total) by mouth 2 (two) times daily. 11/02/13  Yes Shanker Kristeen Mans, MD  ciprofloxacin (CIPRO) 750 MG tablet Take 1 tablet (750 mg total) by mouth 2 (two) times daily. 11/02/13   Shanker Kristeen Mans, MD   Allergies  Allergen Reactions  . Elavil [Amitriptyline] Other (See Comments)    Causes her to be very "disoriented".  . Heparin Other (See Comments)    HIT ab positive with elevated OD  . Duloxetine Other (See Comments)    REACTION: Headache  . Gabapentin Other (See Comments)    REACTION: rash in mouth  . Neomycin-Bacitracin Zn-Polymyx Other (See Comments)    REACTION: rash/hives    FAMILY HISTORY:  family history includes Lung cancer in her father; Stroke in her mother.   SOCIAL HISTORY:  reports that she quit smoking about 2 months ago. Her smoking use included Cigarettes. She has a 41 pack-year smoking history. She has never used smokeless tobacco. She reports that she drinks alcohol. She reports that she uses illicit drugs ("Crack" cocaine and Marijuana).  REVIEW OF SYSTEMS:   Constitutional: Negative for fever, chills, weight loss,  and diaphoresis. + malaise/fatigue HENT:  Negative for hearing loss, ear pain, nosebleeds, congestion, sore throat, neck pain, tinnitus and ear discharge.   Eyes: Negative for blurred vision, double vision, photophobia, pain, discharge and redness.  Respiratory: Negative for hemoptysis, sputum production, wheezing and stridor.  +cough, SOB, minimal sputum production Cardiovascular: Negative for chest pain, palpitations, orthopnea, claudication, leg swelling and PND.  Gastrointestinal: Negative for heartburn, nausea, vomiting, abdominal pain, diarrhea, constipation, blood in stool and melena.  Genitourinary: Negative for dysuria, urgency, frequency, hematuria and flank pain.  Musculoskeletal: Negative for myalgias, back pain, joint pain and falls.  Skin: Negative for itching and rash.  Neurological: Negative for dizziness, tingling, tremors, sensory change, speech change, focal weakness, seizures, loss of consciousness, weakness and headaches.  Endo/Heme/Allergies: Negative for environmental allergies and polydipsia. Does not bruise/bleed easily.  SUBJECTIVE:   VITAL SIGNS: Temp:  [97 F (36.1 C)-98.1 F (36.7 C)] 97 F (36.1 C) (11/17 1300) Pulse Rate:  [60-101] 62 (11/17 1300) Resp:  [14-20] 16 (11/17 1300) BP: (94-117)/(58-80) 98/67 mmHg (11/17 1300) SpO2:  [  92 %-100 %] 98 % (11/17 1441) Weight:  [202 lb 13.2 oz (92 kg)] 202 lb 13.2 oz (92 kg) (11/17 0912)  PHYSICAL EXAMINATION: General:  Obese adult female in NAD  Neuro:  AAOx4, speech clear, MAE  HEENT:  Mm pink/moist, no jvd, short thick neck Cardiovascular:  s1s2 rrr, no m/r/g Lungs:  resp's even/non-labored, lungs bilaterally clear Abdomen:  Obese / soft, bsx4 active  Musculoskeletal:  No acute deformities  Skin:  Warm/dry, no edema    Recent Labs Lab 12/08/13 1705 12/09/13 0441  NA 131* 137  K 4.0 3.7  CL 93* 99  CO2 18* 23  BUN 34* 30*  CREATININE 1.68* 1.42*  GLUCOSE 133* 111*    Recent Labs Lab 12/08/13 1705 12/09/13 0441  HGB 13.0 10.5*  HCT  38.4 31.3*  WBC 10.8* 5.1  PLT 128* 95*   Dg Chest 2 View  12/08/2013   CLINICAL DATA:  Mid chest pain for 1 day, shortness of breath, COPD, former smoker, hypertension, cirrhosis, GERD, history pulmonary embolism  EXAM: CHEST  2 VIEW  COMPARISON:  10/29/2013  FINDINGS: Normal heart size, mediastinal contours, and pulmonary vascularity.  Persistent RIGHT upper lobe infiltrate/opacity wish question developing area central lucency/cavitation.  Bibasilar subsegmental atelectasis greater on LEFT.  Remaining lungs clear.  No pleural effusion or pneumothorax.  Scattered endplate spur formation thoracic spine.  IMPRESSION: Bibasilar atelectasis greater on LEFT.  Persistent RIGHT upper lobe opacity with questionable area of developing central lucency, cannot exclude cavitary pneumonia ; CT chest with contrast recommended to evaluate.   Electronically Signed   By: Lavonia Dana M.D.   On: 12/08/2013 17:35   Ct Angio Chest Pe W/cm &/or Wo Cm  12/08/2013   CLINICAL DATA:  Initial evaluation for acute chest pain, shortness of breath.  EXAM: CT ANGIOGRAPHY CHEST WITH CONTRAST  TECHNIQUE: Multidetector CT imaging of the chest was performed using the standard protocol during bolus administration of intravenous contrast. Multiplanar CT image reconstructions and MIPs were obtained to evaluate the vascular anatomy.  CONTRAST:  75mL OMNIPAQUE IOHEXOL 350 MG/ML SOLN  COMPARISON:  Prior radiograph from earlier the same day, as well as prior CT from 10/08/2013.  FINDINGS: Thyroid gland within normal limits.  Right hilar lymph node measures 1.2 cm in short axis. No other pathologically enlarged mediastinal, hilar, or axillary lymph nodes identified.  Intrathoracic aorta is of normal caliber and appearance. Great vessels within normal limits.  Heart size within normal limits. Small amount of pericardial fluid present.  Pulmonary arterial tree is well opacified. No filling defect to suggest acute pulmonary embolism identified.  Re-formatted imaging confirms these findings. Main pulmonary artery itself is somewhat dilated up to 3.3 cm.  There is dense somewhat ill-defined opacity within the right upper lobe measuring 3.9 x 4.0 x 4.0 cm (series 12, image 16). There are a few scattered foci of cavitation within this opacity. This is new relative to prior CT. Given the relative large size of this opacity, possible cavitary pneumonia is favored.  Scattered tree-in-bud nodular opacities present more inferiorly within the posterior segment of the right lower lobe, also worrisome for infection (series 12, image 39). Similar nodular tree-in-bud opacity seen within the superior segment of the right lower lobe (series 12, image 40). Patchy bibasilar opacities seen more inferiorly within the lower lobes bilaterally.  No pulmonary edema or pleural effusion.  Nodular contour of the partially visualized liver is suggestive of possible cirrhosis. Visualized upper abdomen is otherwise unremarkable.  No acute osseous  abnormality. No worrisome lytic or blastic osseous lesions.  Review of the MIP images confirms the above findings.  IMPRESSION: 1. No CT evidence for acute pulmonary embolism. 2. Dense right upper lobe opacity with central lucency as above, worrisome for cavitary pneumonia. Additional tree-in-bud and patchy opacities within the right upper and bilateral lower lobes also concerning for infection. While an underlying mass lesion is not entirely excluded within the right upper lobe, this is less favored given the relative rapidity of onset as compared to prior CT from 10/08/2013 as well as the presence of infection on that prior study. Followup to resolution is recommended. 3. 1.2 cm right hilar lymph node, likely reactive in nature. 4. Nodular contour of the liver, suggesting cirrhosis. Finding is stable from prior   Electronically Signed   By: Jeannine Boga M.D.   On: 12/08/2013 20:44    ASSESSMENT / PLAN:   RUL Cavitary Lesion - in  setting of recurrent PNA, hx of streptococcal PNA in 09/2013 and subjective hx of aspiration.  DDx includes recurrent aspiration, TB, embolic disease (less likely with neg ECHO in 09/2013 and less likely malignancy.  Recurrent PNA COPD Hx of Intubation / Mechanical Ventilation  Plan: Valma Cava SLP eval for swallowing, has been seen by GI in [place] but wants to establish in [facility]  Sputum for AFB (may not be able to obtain as she is not making sputum) Follow clinical course with abx (clinda, aztreonam, vanco).  Depending on progress, we will either repeat imaging or arrange for bronchoscopy.  Repeat ECHO to ensure no vegetation Pulmonary hygiene, mobilize  Noe Gens, NP-C [facility] Pgr: [phone] or [phone] 12/09/2013, 4:00 PM  Attending Note:  I have examined patient and discussed the case with B Ollis. I have reviewed the labs, films, notes. I agree with the data and plans as amended in her note. Rounded opacity is likely PNA, suspect aspiration, cannot r/o TB. I agree with isolation and quant Gold, at least until response to standard abx therapy can be assessed. She may merit FOB and BAL if clinically or radiographically declining. Will check swallow eval and echo.    Baltazar Apo, MD, PhD 12/09/2013, 4:42 PM [facility] [phone] or if no answer [phone]

## 2013-12-09 NOTE — Progress Notes (Signed)
[facility] PROGRESS NOTE  [name] QPR:[id number] DOB: Apr 11, 1955 DOA: 12/08/2013 PCP: Philis Fendt, MD  Assessment/Plan: 1-cough, SOB and cavitary lesion on RUL: patient w/o symptoms suggesting TB (weight loss, night sweats, no hemoptysis). -4th admission for PNA -now with cavitary lesion (new from September, 2015) -will continue broad spectrum antibiotics -will check HIV and quantiferon Gold -PCCM consulted for potential bronchoscopy; following rec's will check 2-D echo to r/o vegetations and also SPT to r/o aspiration as cause of recurrent PNA's -continue supportive care  2-HTN: soft. But stable -will monitor and continue home meds  3-hx of COPD -continue current inhalers -patient is not wheezing at this moment  4-AKI: appears to be due to dehydration and soft BP -improved with IVF's -will monitor  5-depression: continue citalopram   Code Status: Full Family Communication: no family at bedside Disposition Plan: to be determine   Consultants:  PCCM  Procedures:  Echo :pending  Antibiotics:  Clindamycin    Vancomycin  Aztreonam   HPI/Subjective: Feeling somewhat better; still with some SOB and cough.  Objective: Filed Vitals:   12/09/13 1300  BP: 98/67  Pulse: 62  Temp: 97 F (36.1 C)  Resp: 16    Intake/Output Summary (Last 24 hours) at 12/09/13 1726 Last data filed at 12/09/13 1300  Gross per 24 hour  Intake    960 ml  Output   1300 ml  Net   -340 ml   Filed Weights   12/09/13 0912  Weight: 92 kg (202 lb 13.2 oz)    Exam:   General:  No fever; reports breathing is easier; still with intermittent cough  Cardiovascular: no rubs or gallops, regular rate  Respiratory: scattered rhonchi, no crackles; currently no wheezing  Abdomen: soft, NT, ND, positive BS  Musculoskeletal: no edema or cyanosis   Data Reviewed: Basic Metabolic Panel:  Recent Labs Lab 12/08/13 1705 12/09/13 0441  NA 131* 137  K 4.0 3.7  CL  93* 99  CO2 18* 23  GLUCOSE 133* 111*  BUN 34* 30*  CREATININE 1.68* 1.42*  CALCIUM 10.1 9.2   Liver Function Tests:  Recent Labs Lab 12/09/13 0441  AST 14  ALT 11  ALKPHOS 68  BILITOT 0.3  PROT 7.1  ALBUMIN 3.2*   CBC:  Recent Labs Lab 12/08/13 1705 12/09/13 0441  WBC 10.8* 5.1  HGB 13.0 10.5*  HCT 38.4 31.3*  MCV 88.9 89.9  PLT 128* 95*   BNP (last 3 results)  Recent Labs  12/21/12 1630 07/14/13 2120 12/08/13 1705  PROBNP 149.3* 40.7 145.8*   CBG: No results for input(s): GLUCAP in the last 168 hours.  Recent Results (from the past 240 hour(s))  MRSA PCR Screening     Status: Abnormal   Collection Time: 12/09/13  7:50 AM  Result Value Ref Range Status   MRSA by PCR POSITIVE (A) NEGATIVE Final    Comment:        The GeneXpert MRSA Assay (FDA approved for NASAL specimens only), is one component of a comprehensive MRSA colonization surveillance program. It is not intended to diagnose MRSA infection nor to guide or monitor treatment for MRSA infections. RESULT CALLED TO, READ BACK BY AND VERIFIED WITH: MACABUAG,E @ 1025 ON [id number] BY POTEAT,S      Studies: Dg Chest 2 View  12/08/2013   CLINICAL DATA:  Mid chest pain for 1 day, shortness of breath, COPD, former smoker, hypertension, cirrhosis, GERD, history pulmonary embolism  EXAM: CHEST  2 VIEW  COMPARISON:  10/29/2013  FINDINGS: Normal heart size, mediastinal contours, and pulmonary vascularity.  Persistent RIGHT upper lobe infiltrate/opacity wish question developing area central lucency/cavitation.  Bibasilar subsegmental atelectasis greater on LEFT.  Remaining lungs clear.  No pleural effusion or pneumothorax.  Scattered endplate spur formation thoracic spine.  IMPRESSION: Bibasilar atelectasis greater on LEFT.  Persistent RIGHT upper lobe opacity with questionable area of developing central lucency, cannot exclude cavitary pneumonia ; CT chest with contrast recommended to evaluate.   Electronically  Signed   By: Lavonia Dana M.D.   On: 12/08/2013 17:35   Ct Angio Chest Pe W/cm &/or Wo Cm  12/08/2013   CLINICAL DATA:  Initial evaluation for acute chest pain, shortness of breath.  EXAM: CT ANGIOGRAPHY CHEST WITH CONTRAST  TECHNIQUE: Multidetector CT imaging of the chest was performed using the standard protocol during bolus administration of intravenous contrast. Multiplanar CT image reconstructions and MIPs were obtained to evaluate the vascular anatomy.  CONTRAST:  87mL OMNIPAQUE IOHEXOL 350 MG/ML SOLN  COMPARISON:  Prior radiograph from earlier the same day, as well as prior CT from 10/08/2013.  FINDINGS: Thyroid gland within normal limits.  Right hilar lymph node measures 1.2 cm in short axis. No other pathologically enlarged mediastinal, hilar, or axillary lymph nodes identified.  Intrathoracic aorta is of normal caliber and appearance. Great vessels within normal limits.  Heart size within normal limits. Small amount of pericardial fluid present.  Pulmonary arterial tree is well opacified. No filling defect to suggest acute pulmonary embolism identified. Re-formatted imaging confirms these findings. Main pulmonary artery itself is somewhat dilated up to 3.3 cm.  There is dense somewhat ill-defined opacity within the right upper lobe measuring 3.9 x 4.0 x 4.0 cm (series 12, image 16). There are a few scattered foci of cavitation within this opacity. This is new relative to prior CT. Given the relative large size of this opacity, possible cavitary pneumonia is favored.  Scattered tree-in-bud nodular opacities present more inferiorly within the posterior segment of the right lower lobe, also worrisome for infection (series 12, image 39). Similar nodular tree-in-bud opacity seen within the superior segment of the right lower lobe (series 12, image 40). Patchy bibasilar opacities seen more inferiorly within the lower lobes bilaterally.  No pulmonary edema or pleural effusion.  Nodular contour of the partially  visualized liver is suggestive of possible cirrhosis. Visualized upper abdomen is otherwise unremarkable.  No acute osseous abnormality. No worrisome lytic or blastic osseous lesions.  Review of the MIP images confirms the above findings.  IMPRESSION: 1. No CT evidence for acute pulmonary embolism. 2. Dense right upper lobe opacity with central lucency as above, worrisome for cavitary pneumonia. Additional tree-in-bud and patchy opacities within the right upper and bilateral lower lobes also concerning for infection. While an underlying mass lesion is not entirely excluded within the right upper lobe, this is less favored given the relative rapidity of onset as compared to prior CT from 10/08/2013 as well as the presence of infection on that prior study. Followup to resolution is recommended. 3. 1.2 cm right hilar lymph node, likely reactive in nature. 4. Nodular contour of the liver, suggesting cirrhosis. Finding is stable from prior   Electronically Signed   By: Jeannine Boga M.D.   On: 12/08/2013 20:44    Scheduled Meds: . albuterol  2.5 mg Nebulization TID  . aztreonam  2 g Intravenous 3 times per day  . budesonide-formoterol  2 puff Inhalation BID  . [START ON 12/10/2013] Chlorhexidine Gluconate Cloth  6 each Topical Q0600  . citalopram  20 mg Oral QHS  . clindamycin (CLEOCIN) IV  600 mg Intravenous 3 times per day  . entecavir  0.5 mg Oral QODAY  . folic acid  1 mg Oral Daily  . furosemide  20 mg Oral Daily  . iloperidone  12 mg Oral Daily  . multivitamin with minerals  1 tablet Oral Daily  . mupirocin ointment  1 application Nasal BID  . pantoprazole  80 mg Oral Daily  . rivaroxaban  20 mg Oral Q supper  . senna  1 tablet Oral BID  . spironolactone  100 mg Oral BID  . [START ON 12/10/2013] tenofovir  300 mg Oral QODAY  . thiamine  100 mg Oral Daily  . tiotropium  18 mcg Inhalation Daily  . traMADol  50 mg Oral BID  . vancomycin  1,500 mg Intravenous Q24H  . vitamin A & D        Continuous Infusions: . sodium chloride 50 mL/hr at 12/08/13 2305    Principal Problem:   Cavitary lesion of lung Active Problems:   Hepatitis B virus infection   Essential hypertension   Hepatic cirrhosis   COPD (chronic obstructive pulmonary disease)    Time spent: 30 minutes    Barton Dubois  [facility] Pager [phone]. If 7PM-7AM, please contact night-coverage at [email or web address], password [facility] 12/09/2013, 5:26 PM  LOS: 1 day

## 2013-12-10 DIAGNOSIS — K7460 Unspecified cirrhosis of liver: Secondary | ICD-10-CM

## 2013-12-10 DIAGNOSIS — I319 Disease of pericardium, unspecified: Secondary | ICD-10-CM

## 2013-12-10 DIAGNOSIS — B169 Acute hepatitis B without delta-agent and without hepatic coma: Secondary | ICD-10-CM

## 2013-12-10 DIAGNOSIS — J189 Pneumonia, unspecified organism: Secondary | ICD-10-CM | POA: Insufficient documentation

## 2013-12-10 DIAGNOSIS — J984 Other disorders of lung: Secondary | ICD-10-CM

## 2013-12-10 LAB — GLUCOSE, CAPILLARY: GLUCOSE-CAPILLARY: 94 mg/dL (ref 70–99)

## 2013-12-10 LAB — HIV-1 RNA QUANT-NO REFLEX-BLD
HIV 1 RNA Quant: <20 copies/mL (ref <20)
HIV-1 RNA Quant, Log: <1.3 {Log} (ref <1.30)

## 2013-12-10 NOTE — Progress Notes (Signed)
[facility] PROGRESS NOTE  [name] VWU:[id number] DOB: 1955-05-16 DOA: 12/08/2013  PCP: Philis Fendt, MD  Brief HPI: 58 year old Caucasian female presented to the hospital with feelings of chest congestion and cough with grayish expectoration. She was admitted back in October with pneumonia. Chest x-ray was obtained which revealed a right upper lobe cavitary lesion. CT scan confirmed this finding, and it was felt that this was infectious in process. She was subsequently admitted to the hospital.  Past medical history:  Past Medical History  Diagnosis Date  . Hypertension   . COPD (chronic obstructive pulmonary disease)   . Cirrhosis   . Pulmonary emboli 09/2013  . Renal insufficiency   . Kidney stones   . Heart murmur   . DVT (deep venous thrombosis) 09/2013    RLE  . Pneumonia     "4 times in the past year" (10/30/2013)  . History of blood transfusion     "due to excessive blood loss before hysterectomy"  . GERD (gastroesophageal reflux disease)   . Hepatitis B   . Arthritis     "knees; lower back" (10/30/2013)  . Anxiety   . Depression   . Bipolar disorder     Consultants: Pulmonology  Procedures:  2D ECHO 11/18 Study Conclusions - Left ventricle: The cavity size was normal. Systolic function was normal. The estimated ejection fraction was in the range of 60%to 65%. Wall motion was normal; there were no regional wallmotion abnormalities. Doppler parameters are consistent withabnormal left ventricular relaxation (grade 1 diastolic dysfunction). - Left atrium: The atrium was moderately dilated. - Right atrium: The atrium was mildly dilated. - Atrial septum: No defect or patent foramen ovale was identified. - Pulmonary arteries: PA peak pressure: 32 mm Hg (S). - Pericardium, extracardiac: A small pericardial effusion was identified posterior to the heart.  Antibiotics:  Clindamycin   Vancomycin  Aztreonam  Subjective: Patient feels some better.  Denies any chest pain. But has pain in right upper back. Still with some cough. No shortness of breath.  Objective: Vital Signs  Filed Vitals:   12/09/13 1441 12/09/13 1956 12/10/13 0621 12/10/13 0820  BP:  97/76 97/64   Pulse:  64 62   Temp:  97.5 F (36.4 C) 97.8 F (36.6 C)   TempSrc:  Oral Oral   Resp:  18 16   Height:      Weight:      SpO2: 98% 98% 97% 92%    Intake/Output Summary (Last 24 hours) at 12/10/13 1440 Last data filed at 12/10/13 1150  Gross per 24 hour  Intake   1098 ml  Output   2350 ml  Net  -1252 ml   Filed Weights   12/09/13 0912  Weight: 92 kg (202 lb 13.2 oz)     General appearance: alert, cooperative, appears stated age and no distress Resp: crackles right upper lung field. no wheezing. Cardio: regular rate and rhythm, S1, S2 normal, no murmur, click, rub or gallop GI: soft, non-tender; bowel sounds normal; no masses,  no organomegaly Extremities: extremities normal, atraumatic, no cyanosis or edema Neurologic: No focal deficits  Lab Results:  Basic Metabolic Panel:  Recent Labs Lab 12/08/13 1705 12/09/13 0441  NA 131* 137  K 4.0 3.7  CL 93* 99  CO2 18* 23  GLUCOSE 133* 111*  BUN 34* 30*  CREATININE 1.68* 1.42*  CALCIUM 10.1 9.2   Liver Function Tests:  Recent Labs Lab 12/09/13 0441  AST 14  ALT 11  ALKPHOS  68  BILITOT 0.3  PROT 7.1  ALBUMIN 3.2*   CBC:  Recent Labs Lab 12/08/13 1705 12/09/13 0441  WBC 10.8* 5.1  HGB 13.0 10.5*  HCT 38.4 31.3*  MCV 88.9 89.9  PLT 128* 95*   BNP (last 3 results)  Recent Labs  12/21/12 1630 07/14/13 2120 12/08/13 1705  PROBNP 149.3* 40.7 145.8*   CBG:  Recent Labs Lab 12/10/13 0716  GLUCAP 94    Recent Results (from the past 240 hour(s))  MRSA PCR Screening     Status: Abnormal   Collection Time: 12/09/13  7:50 AM  Result Value Ref Range Status   MRSA by PCR POSITIVE (A) NEGATIVE Final    Comment:        The GeneXpert MRSA Assay (FDA approved for NASAL  specimens only), is one component of a comprehensive MRSA colonization surveillance program. It is not intended to diagnose MRSA infection nor to guide or monitor treatment for MRSA infections. RESULT CALLED TO, READ BACK BY AND VERIFIED WITH: MACABUAG,E @ 1025 ON 111715 BY POTEAT,S   AFB culture with smear     Status: None (Preliminary result)   Collection Time: 12/09/13 11:12 PM  Result Value Ref Range Status   Specimen Description SPUTUM  Final   Special Requests NONE  Final   Acid Fast Smear   Final    NO ACID FAST BACILLI SEEN Performed at [facility]    Culture   Final    CULTURE WILL BE EXAMINED FOR 6 WEEKS BEFORE ISSUING A FINAL REPORT Performed at [facility]    Report Status PENDING  Incomplete      Studies/Results: Dg Chest 2 View  12/08/2013   CLINICAL DATA:  Mid chest pain for 1 day, shortness of breath, COPD, former smoker, hypertension, cirrhosis, GERD, history pulmonary embolism  EXAM: CHEST  2 VIEW  COMPARISON:  10/29/2013  FINDINGS: Normal heart size, mediastinal contours, and pulmonary vascularity.  Persistent RIGHT upper lobe infiltrate/opacity wish question developing area central lucency/cavitation.  Bibasilar subsegmental atelectasis greater on LEFT.  Remaining lungs clear.  No pleural effusion or pneumothorax.  Scattered endplate spur formation thoracic spine.  IMPRESSION: Bibasilar atelectasis greater on LEFT.  Persistent RIGHT upper lobe opacity with questionable area of developing central lucency, cannot exclude cavitary pneumonia ; CT chest with contrast recommended to evaluate.   Electronically Signed   By: Lavonia Dana M.D.   On: 12/08/2013 17:35   Ct Angio Chest Pe W/cm &/or Wo Cm  12/08/2013   CLINICAL DATA:  Initial evaluation for acute chest pain, shortness of breath.  EXAM: CT ANGIOGRAPHY CHEST WITH CONTRAST  TECHNIQUE: Multidetector CT imaging of the chest was performed using the standard protocol during bolus administration of  intravenous contrast. Multiplanar CT image reconstructions and MIPs were obtained to evaluate the vascular anatomy.  CONTRAST:  4mL OMNIPAQUE IOHEXOL 350 MG/ML SOLN  COMPARISON:  Prior radiograph from earlier the same day, as well as prior CT from 10/08/2013.  FINDINGS: Thyroid gland within normal limits.  Right hilar lymph node measures 1.2 cm in short axis. No other pathologically enlarged mediastinal, hilar, or axillary lymph nodes identified.  Intrathoracic aorta is of normal caliber and appearance. Great vessels within normal limits.  Heart size within normal limits. Small amount of pericardial fluid present.  Pulmonary arterial tree is well opacified. No filling defect to suggest acute pulmonary embolism identified. Re-formatted imaging confirms these findings. Main pulmonary artery itself is somewhat dilated up to 3.3 cm.  There  is dense somewhat ill-defined opacity within the right upper lobe measuring 3.9 x 4.0 x 4.0 cm (series 12, image 16). There are a few scattered foci of cavitation within this opacity. This is new relative to prior CT. Given the relative large size of this opacity, possible cavitary pneumonia is favored.  Scattered tree-in-bud nodular opacities present more inferiorly within the posterior segment of the right lower lobe, also worrisome for infection (series 12, image 39). Similar nodular tree-in-bud opacity seen within the superior segment of the right lower lobe (series 12, image 40). Patchy bibasilar opacities seen more inferiorly within the lower lobes bilaterally.  No pulmonary edema or pleural effusion.  Nodular contour of the partially visualized liver is suggestive of possible cirrhosis. Visualized upper abdomen is otherwise unremarkable.  No acute osseous abnormality. No worrisome lytic or blastic osseous lesions.  Review of the MIP images confirms the above findings.  IMPRESSION: 1. No CT evidence for acute pulmonary embolism. 2. Dense right upper lobe opacity with central  lucency as above, worrisome for cavitary pneumonia. Additional tree-in-bud and patchy opacities within the right upper and bilateral lower lobes also concerning for infection. While an underlying mass lesion is not entirely excluded within the right upper lobe, this is less favored given the relative rapidity of onset as compared to prior CT from 10/08/2013 as well as the presence of infection on that prior study. Followup to resolution is recommended. 3. 1.2 cm right hilar lymph node, likely reactive in nature. 4. Nodular contour of the liver, suggesting cirrhosis. Finding is stable from prior   Electronically Signed   By: Jeannine Boga M.D.   On: 12/08/2013 20:44    Medications:  Scheduled: . albuterol  2.5 mg Nebulization TID  . aztreonam  2 g Intravenous 3 times per day  . budesonide-formoterol  2 puff Inhalation BID  . Chlorhexidine Gluconate Cloth  6 each Topical Q0600  . citalopram  20 mg Oral QHS  . clindamycin (CLEOCIN) IV  600 mg Intravenous 3 times per day  . entecavir  0.5 mg Oral QODAY  . folic acid  1 mg Oral Daily  . furosemide  20 mg Oral Daily  . iloperidone  12 mg Oral Daily  . multivitamin with minerals  1 tablet Oral Daily  . mupirocin ointment  1 application Nasal BID  . pantoprazole  80 mg Oral Daily  . rivaroxaban  20 mg Oral Q supper  . senna  1 tablet Oral BID  . spironolactone  100 mg Oral BID  . tenofovir  300 mg Oral QODAY  . thiamine  100 mg Oral Daily  . tiotropium  18 mcg Inhalation Daily  . traMADol  50 mg Oral BID  . vancomycin  1,500 mg Intravenous Q24H   Continuous: . sodium chloride 50 mL/hr at 12/09/13 1844   QMV:HQIONGEXB, ALPRAZolam, bisacodyl, ondansetron **OR** ondansetron (ZOFRAN) IV, oxyCODONE, polyethylene glycol  Assessment/Plan:  Principal Problem:   Cavitary lesion of lung Active Problems:   Hepatitis B virus infection   Essential hypertension   Hepatic cirrhosis   COPD (chronic obstructive pulmonary disease)   Cavitary  pneumonia    Cavitary Lesion on RUL Likely infectious. Continue broad spectrum coverage for now. Pulmonology is following. No plans for bronchoscopy for now. TB rule out in progress. HIV RNA was undetectable. Quantiferon Gold is pending. ECHO as above. No obvious source noted.   Essential HTN Soft but stable.   History of Pulmonary Embolism Diagnosed in September. Continue Rivaroxaban.  History of  COPD Continue current inhalers. Patient is not wheezing at this moment. She stopped smoking after previous hospitalization.   Acute Kidney Injury Appears to be due to dehydration. Improving with IVF's.   History of Depression Continue citalopram  Dysphagia Patient with history of GERD and esophageal dilatation in past. This can be pursued as an outpatient. No urgent need to intervene at this time.  History of Liver Cirrhosis  Follows with transplant Hepatology at [facility]. Holding diuretics due to renal failure.   History of  Hepatitis B  Continue with antiretrovirals.   DVT Prophylaxis: On Xarelto    Code Status: Full Code  Family Communication: Discussed with patient  Disposition Plan: Not ready for discharge.    LOS: 2 days   [facility] Pager [phone] 12/10/2013, 2:40 PM  If 8PM-8AM, please contact night-coverage at [email or web address], password [facility]

## 2013-12-10 NOTE — Consult Note (Addendum)
Name: [name] MRN: [id number] DOB: 10-Dec-1955    ADMISSION DATE:  12/08/2013 CONSULTATION DATE:  12/09/13  REFERRING MD :  Dr. Dyann Kief   CHIEF COMPLAINT:  Recurrent PNA  BRIEF PATIENT DESCRIPTION:  58 yo female former smoker with productive cough and RUL cavitary lesion.  Hx of recurrent admissions for PNA.   SIGNIFICANT EVENTS  10/4-10/11  Admit septic shock, pseudomonal bacteremia, HCAP and PE/DVT (on Xarelto). She was diagnosed HIT POSITIVE during that admission.   TEE on 9/16 was negative for valvular abnormalities.  STUDIES:  11/16  CT Chest >> no PE, dense RUL opacity, worrisome for cavitary PNA, tree-in-bud & patchy opacities RUL, B LL.  New from prior CT in 10/08/13  SUBJECTIVE:  Still has cough with gray sputum, and pleuritic type discomfort Rt upper chest.  VITAL SIGNS: Temp:  [97 F (36.1 C)-97.8 F (36.6 C)] 97.8 F (36.6 C) (11/18 0621) Pulse Rate:  [62-64] 62 (11/18 0621) Resp:  [16-18] 16 (11/18 0621) BP: (97-98)/(64-76) 97/64 mmHg (11/18 0621) SpO2:  [92 %-99 %] 92 % (11/18 0820)  PHYSICAL EXAMINATION: General: pleasant Neuro: normal strength HEENT: no sinus tenderness Cardiovascular: regular Lungs: bronchial breath sounds with rales Rt upper chest, no wheeze Abdomen: soft, non tender Musculoskeletal: no edema Skin:  Warm/dry   CBC Recent Labs     12/08/13  1705  12/09/13  0441  WBC  10.8*  5.1  HGB  13.0  10.5*  HCT  38.4  31.3*  PLT  128*  95*   BMET Recent Labs     12/08/13  1705  12/09/13  0441  NA  131*  137  K  4.0  3.7  CL  93*  99  CO2  18*  23  BUN  34*  30*  CREATININE  1.68*  1.42*  GLUCOSE  133*  111*    Electrolytes Recent Labs     12/08/13  1705  12/09/13  0441  CALCIUM  10.1  9.2   Liver Enzymes Recent Labs     12/09/13  0441  AST  14  ALT  11  ALKPHOS  68  BILITOT  0.3  ALBUMIN  3.2*    Cardiac Enzymes Recent Labs     12/08/13  1705  PROBNP  145.8*    Glucose Recent Labs     12/10/13  0716  GLUCAP  94    Imaging Dg Chest 2 View  12/08/2013   CLINICAL DATA:  Mid chest pain for 1 day, shortness of breath, COPD, former smoker, hypertension, cirrhosis, GERD, history pulmonary embolism  EXAM: CHEST  2 VIEW  COMPARISON:  10/29/2013  FINDINGS: Normal heart size, mediastinal contours, and pulmonary vascularity.  Persistent RIGHT upper lobe infiltrate/opacity wish question developing area central lucency/cavitation.  Bibasilar subsegmental atelectasis greater on LEFT.  Remaining lungs clear.  No pleural effusion or pneumothorax.  Scattered endplate spur formation thoracic spine.  IMPRESSION: Bibasilar atelectasis greater on LEFT.  Persistent RIGHT upper lobe opacity with questionable area of developing central lucency, cannot exclude cavitary pneumonia ; CT chest with contrast recommended to evaluate.   Electronically Signed   By: Lavonia Dana M.D.   On: 12/08/2013 17:35   Ct Angio Chest Pe W/cm &/or Wo Cm  12/08/2013   CLINICAL DATA:  Initial evaluation for acute chest pain, shortness of breath.  EXAM: CT ANGIOGRAPHY CHEST WITH CONTRAST  TECHNIQUE: Multidetector CT imaging of the chest was performed using the standard protocol during bolus administration of intravenous contrast.  Multiplanar CT image reconstructions and MIPs were obtained to evaluate the vascular anatomy.  CONTRAST:  63mL OMNIPAQUE IOHEXOL 350 MG/ML SOLN  COMPARISON:  Prior radiograph from earlier the same day, as well as prior CT from 10/08/2013.  FINDINGS: Thyroid gland within normal limits.  Right hilar lymph node measures 1.2 cm in short axis. No other pathologically enlarged mediastinal, hilar, or axillary lymph nodes identified.  Intrathoracic aorta is of normal caliber and appearance. Great vessels within normal limits.  Heart size within normal limits. Small amount of pericardial fluid present.  Pulmonary arterial tree is well opacified. No filling defect to suggest acute pulmonary embolism identified. Re-formatted  imaging confirms these findings. Main pulmonary artery itself is somewhat dilated up to 3.3 cm.  There is dense somewhat ill-defined opacity within the right upper lobe measuring 3.9 x 4.0 x 4.0 cm (series 12, image 16). There are a few scattered foci of cavitation within this opacity. This is new relative to prior CT. Given the relative large size of this opacity, possible cavitary pneumonia is favored.  Scattered tree-in-bud nodular opacities present more inferiorly within the posterior segment of the right lower lobe, also worrisome for infection (series 12, image 39). Similar nodular tree-in-bud opacity seen within the superior segment of the right lower lobe (series 12, image 40). Patchy bibasilar opacities seen more inferiorly within the lower lobes bilaterally.  No pulmonary edema or pleural effusion.  Nodular contour of the partially visualized liver is suggestive of possible cirrhosis. Visualized upper abdomen is otherwise unremarkable.  No acute osseous abnormality. No worrisome lytic or blastic osseous lesions.  Review of the MIP images confirms the above findings.  IMPRESSION: 1. No CT evidence for acute pulmonary embolism. 2. Dense right upper lobe opacity with central lucency as above, worrisome for cavitary pneumonia. Additional tree-in-bud and patchy opacities within the right upper and bilateral lower lobes also concerning for infection. While an underlying mass lesion is not entirely excluded within the right upper lobe, this is less favored given the relative rapidity of onset as compared to prior CT from 10/08/2013 as well as the presence of infection on that prior study. Followup to resolution is recommended. 3. 1.2 cm right hilar lymph node, likely reactive in nature. 4. Nodular contour of the liver, suggesting cirrhosis. Finding is stable from prior   Electronically Signed   By: Jeannine Boga M.D.   On: 12/08/2013 20:44       ASSESSMENT / PLAN:  RUL cavitary lesion with hx of  recurrent PNA.  Most likely bacterial.  Suspicion for TB is low, but not zero. Plan: Day 3 of abx, currently on aztreonam/clindamycin/vancomycin F/u Echo to assess for vegetation Defer bronchoscopy for now >> if no radiographic improvement, then will need to assess for bronchoscopy F/u CXR on 11/20  Quantiferon Gold 11/17 >> HIV 11/17 >> Sputum AFB 11/17 >> Sputum 11/18 >>  Hx of COPD. Plan: Spiriva, symbicort and prn albuterol  Hx of PE/DVT from September 2015. Plan: Per primary team  Hx of polysubstance abuse (ETOH, cocaine, THC). Hx of Cirrhosis from ETOH, Hep B. Plan: Per primary team  SUMMARY: Continue Abx, f/u sputum AFB and culture, Quantiferon Gold.  Will f/u on 11/20 after CXR >> call if help needed sooner.  Chesley Mires, MD [facility] Pulmonary/Critical Care 12/10/2013, 10:50 AM Pager:  [phone] After 3pm call: [phone]

## 2013-12-10 NOTE — Care Management Note (Signed)
CARE MANAGEMENT NOTE 12/10/2013  Patient:  [name], [name]   Account Number:  [id number]  Date Initiated:  12/10/2013  Documentation initiated by:  Marney Doctor  Subjective/Objective Assessment:   58 yo admitted with Cavitary lesion of lung. Hx of PMH of HTN, COPD, Hepatitis B/C+ with cirrhosis, PE/DVT (09/2103), GERD, Anxiety / Depression, Bipolar Disorder, & Former Cocaine abuse     Action/Plan:   from home with children   Anticipated DC Date:  12/15/2013   Anticipated DC Plan:  [place]  CM consult      Choice offered to / List presented to:             Status of service:  In process, will continue to follow Medicare Important Message given?   (If response is "NO", the following Medicare IM given date fields will be blank) Date Medicare IM given:   Medicare IM given by:   Date Additional Medicare IM given:   Additional Medicare IM given by:    Discharge Disposition:    Per UR Regulation:  Reviewed for med. necessity/level of care/duration of stay  If discussed at [place] of Stay Meetings, dates discussed:    Comments:  12/10/13 Marney Doctor RN,BSN,NCM [phone] Chart reviewed and CM following for DC needs.

## 2013-12-10 NOTE — Progress Notes (Signed)
Echo Lab  2D Echocardiogram completed.  [name], RDCS 12/10/2013 9:10 AM

## 2013-12-10 NOTE — Evaluation (Signed)
Clinical/Bedside Swallow Evaluation Patient Details  Name: [name] MRN: [id number] Date of Birth: [birth year]  Today's Date: 12/10/2013 Time: 0932-3557 SLP Time Calculation (min) (ACUTE ONLY): 32 min  Past Medical History:  Past Medical History  Diagnosis Date  . Hypertension   . COPD (chronic obstructive pulmonary disease)   . Cirrhosis   . Pulmonary emboli 09/2013  . Renal insufficiency   . Kidney stones   . Heart murmur   . DVT (deep venous thrombosis) 09/2013    RLE  . Pneumonia     "4 times in the past year" (10/30/2013)  . History of blood transfusion     "due to excessive blood loss before hysterectomy"  . GERD (gastroesophageal reflux disease)   . Hepatitis B   . Arthritis     "knees; lower back" (10/30/2013)  . Anxiety   . Depression   . Bipolar disorder    Past Surgical History:  Past Surgical History  Procedure Laterality Date  . Carpal tunnel release Left   . Fracture surgery    . Ankle fracture surgery Left   . Abdominal hysterectomy    . Tubal ligation    . Dilation and curettage of uterus  X 2   HPI:  [name] was admitted to [facility] with cough/dyspnea.  PMH + for recurrent pnas = x6 in one year per pt, COPD - h/o smoking until Oct 2015, GERD, Hep B.  Swallow evaluation ordered to rule out overt aspiration.  Pt reports h/o dysphagia *esophageal in nature* requiring dilatation every year.     Assessment / Plan / Recommendation Clinical Impression  Pt presents with negative CN exam and functional oropharyngeal swallow ability evaluated at bedside.  She does admit to occasional increased work of breathing with intake that causes her to choke/cough.    Pt also states she has worsening reflux in the last 2 months and solid food dysphagia x1 month.  Pt reports sensation of food stasis pointing to proximal esophagus with occasional regurgitation of food.  When asked if liquid consumption aids clearance, pt confirmed this to be helpful.  She does admit current  symptoms are consistent with prior to dilatations.  Advised her to follow up with a GI MD (pt prefers to find MD in [place]) after she recovers from acute illness.    No s/s of aspiration noted with intake during evaluation nor increased work of breathing.  Recommend pt continue regular/thin diet with strict aspiration/reflux precautions.  Using teach back reinforced education.  No furhter SLP warranted as all education completed.      Aspiration Risk  Mild    Diet Recommendation Regular;Thin liquid   Liquid Administration via: Cup;Straw Medication Administration: Whole meds with liquid Supervision: Patient able to self feed Compensations: Slow rate;Small sips/bites;Follow solids with liquid Postural Changes and/or Swallow Maneuvers: Seated upright 90 degrees;Out of bed for meals (start meals with liquids, consume liquids throughout meal)    Other  Recommendations Oral Care Recommendations: Oral care BID   Follow Up Recommendations       Frequency and Duration        Pertinent Vitals/Pain Afebrile, decreased     Swallow Study Prior Functional Status   see notes    General Date of Onset: 12/10/13 HPI: [name] was admitted to [facility] with cough/dyspnea.  PMH + for recurrent pnas = x6 in one year per pt, COPD - h/o smoking until Oct 2015, GERD, Hep B.  Swallow evaluation ordered to rule out overt aspiration.  Pt reports h/o dysphagia *esophageal in nature* requiring dilatation every year.   Type of Study: Bedside swallow evaluation Diet Prior to this Study: Regular;Thin liquids Temperature Spikes Noted: No Respiratory Status: Room air History of Recent Intubation: No Behavior/Cognition: Alert;Cooperative;Pleasant mood Oral Cavity - Dentition: Adequate natural dentition (some missing dentition) Self-Feeding Abilities: Able to feed self Patient Positioning: Upright in bed Baseline Vocal Quality: Clear Volitional Cough: Strong Volitional Swallow: Able to elicit    Oral/Motor/Sensory  Function Overall Oral Motor/Sensory Function: Appears within functional limits for tasks assessed   Ice Chips Ice chips: Not tested   Thin Liquid Thin Liquid: Within functional limits Presentation: Cup;Straw;Self Fed    Nectar Thick Nectar Thick Liquid: Not tested   Honey Thick Honey Thick Liquid: Not tested   Puree Puree: Within functional limits Presentation: Self Fed;Spoon   Solid   GO    Solid: Within functional limits Presentation: Self Fed;Spoon       Luanna Salk, [place] [facility] SLP [phone]

## 2013-12-11 LAB — CBC
HCT: 33.3 % — ABNORMAL LOW (ref 36.0–46.0)
Hemoglobin: 10.7 g/dL — ABNORMAL LOW (ref 12.0–15.0)
MCH: 29.7 pg (ref 26.0–34.0)
MCHC: 32.1 g/dL (ref 30.0–36.0)
MCV: 92.5 fL (ref 78.0–100.0)
PLATELETS: 130 10*3/uL — AB (ref 150–400)
RBC: 3.6 MIL/uL — ABNORMAL LOW (ref 3.87–5.11)
RDW: 14.6 % (ref 11.5–15.5)
WBC: 3.3 10*3/uL — ABNORMAL LOW (ref 4.0–10.5)

## 2013-12-11 LAB — BASIC METABOLIC PANEL
ANION GAP: 9 (ref 5–15)
BUN: 14 mg/dL (ref 6–23)
CALCIUM: 9.5 mg/dL (ref 8.4–10.5)
CO2: 24 mEq/L (ref 19–32)
CREATININE: 1.36 mg/dL — AB (ref 0.50–1.10)
Chloride: 102 mEq/L (ref 96–112)
GFR calc non Af Amer: 43 mL/min — ABNORMAL LOW (ref >90)
GFR, EST AFRICAN AMERICAN: 49 mL/min — AB (ref >90)
Glucose, Bld: 83 mg/dL (ref 70–99)
Potassium: 4.6 mEq/L (ref 3.7–5.3)
SODIUM: 135 meq/L — AB (ref 137–147)

## 2013-12-11 LAB — QUANTIFERON TB GOLD ASSAY (BLOOD)
INTERFERON GAMMA RELEASE ASSAY: NEGATIVE
MITOGEN VALUE: 6.71 [IU]/mL
Quantiferon Nil Value: 0.03 IU/mL
TB ANTIGEN MINUS NIL VALUE: 0.03 [IU]/mL
TB Ag value: 0.06 IU/mL

## 2013-12-11 LAB — GLUCOSE, CAPILLARY: Glucose-Capillary: 81 mg/dL (ref 70–99)

## 2013-12-11 LAB — EXPECTORATED SPUTUM ASSESSMENT W REFEX TO RESP CULTURE

## 2013-12-11 LAB — EXPECTORATED SPUTUM ASSESSMENT W GRAM STAIN, RFLX TO RESP C

## 2013-12-11 MED ORDER — POLYETHYLENE GLYCOL 3350 17 G PO PACK
17.0000 g | PACK | Freq: Two times a day (BID) | ORAL | Status: DC
  Administered 2013-12-11 – 2013-12-16 (×11): 17 g via ORAL
  Filled 2013-12-11 (×12): qty 1

## 2013-12-11 MED ORDER — VANCOMYCIN HCL IN DEXTROSE 750-5 MG/150ML-% IV SOLN
750.0000 mg | Freq: Two times a day (BID) | INTRAVENOUS | Status: DC
  Administered 2013-12-11 – 2013-12-15 (×8): 750 mg via INTRAVENOUS
  Filled 2013-12-11 (×9): qty 150

## 2013-12-11 NOTE — Progress Notes (Signed)
[facility] PROGRESS NOTE  [name] BTD:[id number] DOB: 1955-11-27 DOA: 12/08/2013  PCP: Philis Fendt, MD  Brief HPI: 58 year old Caucasian female presented to the hospital with feelings of chest congestion and cough with grayish expectoration. She was admitted back in October with pneumonia. Chest x-ray was obtained which revealed a right upper lobe cavitary lesion. CT scan confirmed this finding, and it was felt that this was infectious in process. She was subsequently admitted to the hospital.  Past medical history:  Past Medical History  Diagnosis Date  . Hypertension   . COPD (chronic obstructive pulmonary disease)   . Cirrhosis   . Pulmonary emboli 09/2013  . Renal insufficiency   . Kidney stones   . Heart murmur   . DVT (deep venous thrombosis) 09/2013    RLE  . Pneumonia     "4 times in the past year" (10/30/2013)  . History of blood transfusion     "due to excessive blood loss before hysterectomy"  . GERD (gastroesophageal reflux disease)   . Hepatitis B   . Arthritis     "knees; lower back" (10/30/2013)  . Anxiety   . Depression   . Bipolar disorder     Consultants: Pulmonology  Procedures:  2D ECHO 11/18 Study Conclusions - Left ventricle: The cavity size was normal. Systolic function was normal. The estimated ejection fraction was in the range of 60%to 65%. Wall motion was normal; there were no regional wallmotion abnormalities. Doppler parameters are consistent withabnormal left ventricular relaxation (grade 1 diastolic dysfunction). - Left atrium: The atrium was moderately dilated. - Right atrium: The atrium was mildly dilated. - Atrial septum: No defect or patent foramen ovale was identified. - Pulmonary arteries: PA peak pressure: 32 mm Hg (S). - Pericardium, extracardiac: A small pericardial effusion was identified posterior to the heart.  Antibiotics:  Clindamycin   Vancomycin  Aztreonam  Subjective: Patient feels same as  yesterday. Continues to have pain in right upper back. Still with some cough. No shortness of breath.  Objective: Vital Signs  Filed Vitals:   12/10/13 1451 12/10/13 2005 12/11/13 0704 12/11/13 0829  BP:  94/62 107/74   Pulse:  64 70   Temp:  97.8 F (36.6 C) 98.2 F (36.8 C)   TempSrc:  Oral Oral   Resp:  20 20   Height:      Weight:      SpO2: 97% 93% 95% 96%    Intake/Output Summary (Last 24 hours) at 12/11/13 0835 Last data filed at 12/11/13 3646  Gross per 24 hour  Intake    720 ml  Output   3200 ml  Net  -2480 ml   Filed Weights   12/09/13 0912  Weight: 92 kg (202 lb 13.2 oz)     General appearance: alert, cooperative, appears stated age and no distress Resp: crackles right upper lung field. no wheezing. Cardio: regular rate and rhythm, S1, S2 normal, no murmur, click, rub or gallop GI: soft, non-tender; bowel sounds normal; no masses,  no organomegaly Extremities: extremities normal, atraumatic, no cyanosis or edema Neurologic: No focal deficits  Lab Results:  Basic Metabolic Panel:  Recent Labs Lab 12/08/13 1705 12/09/13 0441 12/11/13 0525  NA 131* 137 135*  K 4.0 3.7 4.6  CL 93* 99 102  CO2 18* 23 24  GLUCOSE 133* 111* 83  BUN 34* 30* 14  CREATININE 1.68* 1.42* 1.36*  CALCIUM 10.1 9.2 9.5   Liver Function Tests:  Recent Labs Lab 12/09/13  0441  AST 14  ALT 11  ALKPHOS 68  BILITOT 0.3  PROT 7.1  ALBUMIN 3.2*   CBC:  Recent Labs Lab 12/08/13 1705 12/09/13 0441 12/11/13 0525  WBC 10.8* 5.1 3.3*  HGB 13.0 10.5* 10.7*  HCT 38.4 31.3* 33.3*  MCV 88.9 89.9 92.5  PLT 128* 95* 130*   BNP (last 3 results)  Recent Labs  12/21/12 1630 07/14/13 2120 12/08/13 1705  PROBNP 149.3* 40.7 145.8*   CBG:  Recent Labs Lab 12/10/13 0716 12/11/13 0754  GLUCAP 94 81    Recent Results (from the past 240 hour(s))  MRSA PCR Screening     Status: Abnormal   Collection Time: 12/09/13  7:50 AM  Result Value Ref Range Status   MRSA by  PCR POSITIVE (A) NEGATIVE Final    Comment:        The GeneXpert MRSA Assay (FDA approved for NASAL specimens only), is one component of a comprehensive MRSA colonization surveillance program. It is not intended to diagnose MRSA infection nor to guide or monitor treatment for MRSA infections. RESULT CALLED TO, READ BACK BY AND VERIFIED WITH: MACABUAG,E @ 1025 ON 111715 BY POTEAT,S   AFB culture with smear     Status: None (Preliminary result)   Collection Time: 12/09/13 11:12 PM  Result Value Ref Range Status   Specimen Description SPUTUM  Final   Special Requests NONE  Final   Acid Fast Smear   Final    NO ACID FAST BACILLI SEEN Performed at [facility]    Culture   Final    CULTURE WILL BE EXAMINED FOR 6 WEEKS BEFORE ISSUING A FINAL REPORT Performed at [facility]    Report Status PENDING  Incomplete      Studies/Results: No results found.  Medications:  Scheduled: . albuterol  2.5 mg Nebulization TID  . aztreonam  2 g Intravenous 3 times per day  . budesonide-formoterol  2 puff Inhalation BID  . Chlorhexidine Gluconate Cloth  6 each Topical Q0600  . citalopram  20 mg Oral QHS  . clindamycin (CLEOCIN) IV  600 mg Intravenous 3 times per day  . entecavir  0.5 mg Oral QODAY  . folic acid  1 mg Oral Daily  . iloperidone  12 mg Oral Daily  . multivitamin with minerals  1 tablet Oral Daily  . mupirocin ointment  1 application Nasal BID  . pantoprazole  80 mg Oral Daily  . polyethylene glycol  17 g Oral BID  . rivaroxaban  20 mg Oral Q supper  . senna  1 tablet Oral BID  . tenofovir  300 mg Oral QODAY  . thiamine  100 mg Oral Daily  . tiotropium  18 mcg Inhalation Daily  . traMADol  50 mg Oral BID  . vancomycin  1,500 mg Intravenous Q24H   Continuous: . sodium chloride 50 mL/hr at 12/09/13 1844   IRS:WNIOEVOJJ, ALPRAZolam, bisacodyl, ondansetron **OR** ondansetron (ZOFRAN) IV, oxyCODONE  Assessment/Plan:  Principal Problem:   Cavitary  lesion of lung Active Problems:   Hepatitis B virus infection   Essential hypertension   Hepatic cirrhosis   COPD (chronic obstructive pulmonary disease)   Cavitary pneumonia    Cavitary Lesion on RUL Likely infectious. Continue broad spectrum coverage for now. Pulmonology is following. No plans for bronchoscopy for now. TB rule out in progress. HIV RNA was undetectable. Quantiferon Gold is pending. ECHO as above. No obvious source noted. Right upper back pain likely due to the lesion.  Repeat CXR 11/20.  Essential HTN Stable.   History of Pulmonary Embolism Diagnosed in September. Continue Rivaroxaban.  History of COPD Continue current inhalers. Patient is not wheezing at this moment. She stopped smoking after previous hospitalization.   Acute Kidney Injury Appears to be due to dehydration. Improving with IVF's.   History of Depression Continue citalopram  Dysphagia Patient with history of GERD and esophageal dilatation in past. This can be pursued as an outpatient. No urgent need to intervene at this time.  History of Liver Cirrhosis  Follows with [facility] Hepatology at [facility]. Holding diuretics due to renal failure.   History of  Hepatitis B  Continue with antiretrovirals.   DVT Prophylaxis: On Xarelto    Code Status: Full Code  Family Communication: Discussed with patient  Disposition Plan: Not ready for discharge.    LOS: 3 days   [place] [facility] Pager [phone] 12/11/2013, 8:35 AM  If 8PM-8AM, please contact night-coverage at [email or web address], password [facility]

## 2013-12-11 NOTE — Progress Notes (Signed)
ANTIBIOTIC CONSULT NOTE - Follow up  Pharmacy Consult for Vancomycin and Aztreonam Indication: pneumonia  Allergies  Allergen Reactions  . Elavil [Amitriptyline] Other (See Comments)    Causes her to be very "disoriented".  . Heparin Other (See Comments)    HIT ab positive with elevated OD  . Duloxetine Other (See Comments)    REACTION: Headache  . Gabapentin Other (See Comments)    REACTION: rash in mouth  . Neomycin-Bacitracin Zn-Polymyx Other (See Comments)    REACTION: rash/hives    Patient Measurements: Height: 5\' 4"  (162.6 cm) Weight: 202 lb 13.2 oz (92 kg) IBW/kg (Calculated) : 54.7 ABW: 70kg   Assessment: 58 yo female presents 11/16 with chest pain and shortness of breath. Hx HBV- on treatment. CTa shows dense right upper lobe opacity worrisome for cavitary pneumonia. Pharmacy is consulted to dose vancomycin and aztreonam; patient received a dose of zosyn in the ED. History pseudomonas in blood trach aspirate from 10/26/13 pansensitive. Per patient, has had 4 episodes of PNA.  Antiinfectives 11/16 pm >> Vancomycin >> 11/17 >> Aztreonam >> 11/16 pm >> Clinda (MD) >>  Labs / vitals Temp: remains afebrile WBC: now low at 3.3 Renal: SCr improved to 1.36 (1.1 in Oct), CrCl 50 (CG/N)  Microbiology 11/16 Quanteferon TB gold: IP 11/17 MRSA PCR: POSITIVE 11/17 AFB sputum: ngtd 11/19 sputum: IP  Levels / Dose changes: 11/19: empiric change in vancomycin dose from 1500mg  q24 to 750mg  q12h with updated weight and improved renal function  11/19: D3 Vanc / Aztreonam / Clindamycin for new cavitary PNA. No PCN allergy- started on clinda/vanc/aztreonam as MD wants broader anaerobic coverage with new cavitary lesions. Pulmonary following. Ruling out TB and vegetations per ECHO.  Goal of Therapy:  Vancomycin trough level 15-20 mcg/ml  Aztreonam dose per renal function  Plan:   Change vancomycin to 750mg  IV q12h  Continue ztreonam 2g IV q8h  Follow up renal function &  cultures, clinical course  Thank you for the consult.  Currie Paris, PharmD, BCPS Pager: [phone] Pharmacy: [phone] 12/11/2013 1:13 PM

## 2013-12-12 ENCOUNTER — Inpatient Hospital Stay (HOSPITAL_COMMUNITY): Payer: Medicare Other

## 2013-12-12 LAB — BASIC METABOLIC PANEL
Anion gap: 11 (ref 5–15)
BUN: 14 mg/dL (ref 6–23)
CO2: 25 meq/L (ref 19–32)
Calcium: 9.5 mg/dL (ref 8.4–10.5)
Chloride: 100 mEq/L (ref 96–112)
Creatinine, Ser: 1.21 mg/dL — ABNORMAL HIGH (ref 0.50–1.10)
GFR calc Af Amer: 57 mL/min — ABNORMAL LOW (ref >90)
GFR, EST NON AFRICAN AMERICAN: 49 mL/min — AB (ref >90)
GLUCOSE: 93 mg/dL (ref 70–99)
POTASSIUM: 4.3 meq/L (ref 3.7–5.3)
Sodium: 136 mEq/L — ABNORMAL LOW (ref 137–147)

## 2013-12-12 LAB — CBC
HCT: 31.6 % — ABNORMAL LOW (ref 36.0–46.0)
HEMOGLOBIN: 10.3 g/dL — AB (ref 12.0–15.0)
MCH: 29.7 pg (ref 26.0–34.0)
MCHC: 32.6 g/dL (ref 30.0–36.0)
MCV: 91.1 fL (ref 78.0–100.0)
Platelets: 126 10*3/uL — ABNORMAL LOW (ref 150–400)
RBC: 3.47 MIL/uL — AB (ref 3.87–5.11)
RDW: 14.4 % (ref 11.5–15.5)
WBC: 5 10*3/uL (ref 4.0–10.5)

## 2013-12-12 LAB — GLUCOSE, CAPILLARY: GLUCOSE-CAPILLARY: 116 mg/dL — AB (ref 70–99)

## 2013-12-12 MED ORDER — TENOFOVIR DISOPROXIL FUMARATE 300 MG PO TABS
300.0000 mg | ORAL_TABLET | ORAL | Status: DC
  Administered 2013-12-13 – 2013-12-15 (×2): 300 mg via ORAL
  Filled 2013-12-12 (×2): qty 1

## 2013-12-12 NOTE — Progress Notes (Signed)
[facility] PROGRESS NOTE  [name] ATF:[id number] DOB: 27-Dec-1955 DOA: 12/08/2013  PCP: Philis Fendt, MD  Brief HPI: 58 year old Caucasian female presented to the hospital with feelings of chest congestion and cough with grayish expectoration. She was admitted back in October with pneumonia. Chest x-ray was obtained which revealed a right upper lobe cavitary lesion. CT scan confirmed this finding, and it was felt that this was infectious in process. She was subsequently admitted to the hospital.  Past medical history:  Past Medical History  Diagnosis Date  . Hypertension   . COPD (chronic obstructive pulmonary disease)   . Cirrhosis   . Pulmonary emboli 09/2013  . Renal insufficiency   . Kidney stones   . Heart murmur   . DVT (deep venous thrombosis) 09/2013    RLE  . Pneumonia     "4 times in the past year" (10/30/2013)  . History of blood transfusion     "due to excessive blood loss before hysterectomy"  . GERD (gastroesophageal reflux disease)   . Hepatitis B   . Arthritis     "knees; lower back" (10/30/2013)  . Anxiety   . Depression   . Bipolar disorder     Consultants: Pulmonology  Procedures:  2D ECHO 11/18 Study Conclusions - Left ventricle: The cavity size was normal. Systolic function was normal. The estimated ejection fraction was in the range of 60%to 65%. Wall motion was normal; there were no regional wallmotion abnormalities. Doppler parameters are consistent withabnormal left ventricular relaxation (grade 1 diastolic dysfunction). - Left atrium: The atrium was moderately dilated. - Right atrium: The atrium was mildly dilated. - Atrial septum: No defect or patent foramen ovale was identified. - Pulmonary arteries: PA peak pressure: 32 mm Hg (S). - Pericardium, extracardiac: A small pericardial effusion was identified posterior to the heart.  Antibiotics:  Clindamycin   Vancomycin  Aztreonam  Subjective: Patient feels better. Pain  in the right upper back is better. Cough persists. No shortness of breath.  Objective: Vital Signs  Filed Vitals:   12/11/13 2115 12/11/13 2350 12/12/13 0502 12/12/13 0847  BP:  111/75 109/72   Pulse: 56 76 63   Temp:  98 F (36.7 C) 97.8 F (36.6 C)   TempSrc:  Oral Oral   Resp: 20 20 20    Height:      Weight:      SpO2: 99% 98% 99% 98%    Intake/Output Summary (Last 24 hours) at 12/12/13 0856 Last data filed at 12/12/13 2706  Gross per 24 hour  Intake 4208.33 ml  Output   3000 ml  Net 1208.33 ml   Filed Weights   12/09/13 0912  Weight: 92 kg (202 lb 13.2 oz)     General appearance: alert, cooperative, appears stated age and no distress Resp: Few crackles right upper lung field. no wheezing. Cardio: regular rate and rhythm, S1, S2 normal, no murmur, click, rub or gallop GI: soft, non-tender; bowel sounds normal; no masses,  no organomegaly Extremities: extremities normal, atraumatic, no cyanosis or edema Neurologic: No focal deficits  Lab Results:  Basic Metabolic Panel:  Recent Labs Lab 12/08/13 1705 12/09/13 0441 12/11/13 0525 12/12/13 0500  NA 131* 137 135* 136*  K 4.0 3.7 4.6 4.3  CL 93* 99 102 100  CO2 18* 23 24 25   GLUCOSE 133* 111* 83 93  BUN 34* 30* 14 14  CREATININE 1.68* 1.42* 1.36* 1.21*  CALCIUM 10.1 9.2 9.5 9.5   Liver Function Tests:  Recent  Labs Lab 12/09/13 0441  AST 14  ALT 11  ALKPHOS 68  BILITOT 0.3  PROT 7.1  ALBUMIN 3.2*   CBC:  Recent Labs Lab 12/08/13 1705 12/09/13 0441 12/11/13 0525 12/12/13 0500  WBC 10.8* 5.1 3.3* 5.0  HGB 13.0 10.5* 10.7* 10.3*  HCT 38.4 31.3* 33.3* 31.6*  MCV 88.9 89.9 92.5 91.1  PLT 128* 95* 130* 126*   BNP (last 3 results)  Recent Labs  12/21/12 1630 07/14/13 2120 12/08/13 1705  PROBNP 149.3* 40.7 145.8*   CBG:  Recent Labs Lab 12/10/13 0716 12/11/13 0754  GLUCAP 94 81    Recent Results (from the past 240 hour(s))  MRSA PCR Screening     Status: Abnormal   Collection  Time: 12/09/13  7:50 AM  Result Value Ref Range Status   MRSA by PCR POSITIVE (A) NEGATIVE Final    Comment:        The GeneXpert MRSA Assay (FDA approved for NASAL specimens only), is one component of a comprehensive MRSA colonization surveillance program. It is not intended to diagnose MRSA infection nor to guide or monitor treatment for MRSA infections. RESULT CALLED TO, READ BACK BY AND VERIFIED WITH: MACABUAG,E @ 1025 ON 111715 BY POTEAT,S   AFB culture with smear     Status: None (Preliminary result)   Collection Time: 12/09/13 11:12 PM  Result Value Ref Range Status   Specimen Description SPUTUM  Final   Special Requests NONE  Final   Acid Fast Smear   Final    NO ACID FAST BACILLI SEEN Performed at [facility]    Culture   Final    CULTURE WILL BE EXAMINED FOR 6 WEEKS BEFORE ISSUING A FINAL REPORT Performed at [facility]    Report Status PENDING  Incomplete  AFB culture with smear     Status: None (Preliminary result)   Collection Time: 12/10/13 10:20 PM  Result Value Ref Range Status   Specimen Description SPUTUM  Final   Special Requests NONE  Final   Acid Fast Smear   Final    NO ACID FAST BACILLI SEEN Performed at [facility]    Culture   Final    CULTURE WILL BE EXAMINED FOR 6 WEEKS BEFORE ISSUING A FINAL REPORT Performed at [facility]    Report Status PENDING  Incomplete  Culture, expectorated sputum-assessment     Status: None   Collection Time: 12/11/13  9:56 AM  Result Value Ref Range Status   Specimen Description SPUTUM  Final   Special Requests NONE  Final   Sputum evaluation   Final    THIS SPECIMEN IS ACCEPTABLE. RESPIRATORY CULTURE REPORT TO FOLLOW.   Report Status 12/11/2013 FINAL  Final  Culture, respiratory (NON-Expectorated)     Status: None (Preliminary result)   Collection Time: 12/11/13  9:56 AM  Result Value Ref Range Status   Specimen Description SPUTUM  Final   Special Requests NONE  Final    Gram Stain   Final    NO WBC SEEN NO SQUAMOUS EPITHELIAL CELLS SEEN RARE GRAM POSITIVE COCCI IN PAIRS Performed at [facility]    Culture   Final    Culture reincubated for better growth Performed at [facility]    Report Status PENDING  Incomplete      Studies/Results: No results found.  Medications:  Scheduled: . albuterol  2.5 mg Nebulization TID  . aztreonam  2 g Intravenous 3 times per day  .  budesonide-formoterol  2 puff Inhalation BID  . Chlorhexidine Gluconate Cloth  6 each Topical Q0600  . citalopram  20 mg Oral QHS  . clindamycin (CLEOCIN) IV  600 mg Intravenous 3 times per day  . entecavir  0.5 mg Oral QODAY  . folic acid  1 mg Oral Daily  . iloperidone  12 mg Oral Daily  . multivitamin with minerals  1 tablet Oral Daily  . mupirocin ointment  1 application Nasal BID  . pantoprazole  80 mg Oral Daily  . polyethylene glycol  17 g Oral BID  . rivaroxaban  20 mg Oral Q supper  . senna  1 tablet Oral BID  . tenofovir  300 mg Oral QODAY  . thiamine  100 mg Oral Daily  . tiotropium  18 mcg Inhalation Daily  . traMADol  50 mg Oral BID  . vancomycin  750 mg Intravenous Q12H   Continuous: . sodium chloride 1,000 mL (12/11/13 2030)   OIN:OMVEHMCNO, ALPRAZolam, bisacodyl, ondansetron **OR** ondansetron (ZOFRAN) IV, oxyCODONE  Assessment/Plan:  Principal Problem:   Cavitary lesion of lung Active Problems:   Hepatitis B virus infection   Essential hypertension   Hepatic cirrhosis   COPD (chronic obstructive pulmonary disease)   Cavitary pneumonia    Cavitary Lesion on RUL Likely infectious. Continue broad spectrum coverage for now. Pulmonology is following. No plans for bronchoscopy for now. TB rule out in progress. HIV RNA was undetectable. Quantiferon Gold is negative. ECHO as above. No obvious source noted. Right upper back pain likely due to the lesion. Repeat CXR 11/20.  Essential HTN Stable.   History of Pulmonary  Embolism Diagnosed in September. Continue Rivaroxaban.  History of COPD Continue current inhalers. Patient is not wheezing at this moment. She stopped smoking after previous hospitalization.   Acute Kidney Injury Appears to be due to dehydration. Improved with IVF's. Cut back on fluids.  History of Depression Continue citalopram  Dysphagia Patient with history of GERD and esophageal dilatation in past. This can be pursued as an outpatient. No urgent need to intervene at this time.  History of Liver Cirrhosis  Follows with [facility] Hepatology at [facility]. Holding diuretics due to renal failure. Could consider be initiating at a lower dose in a day or 2.  History of  Hepatitis B  Continue with antiretrovirals.   DVT Prophylaxis: On Xarelto    Code Status: Full Code  Family Communication: Discussed with patient and her daughter over the phone Disposition Plan: Not ready for discharge. Hopefully can discontinue isolation once the third AFB comes back negative.    LOS: 4 days   [place] [facility] Pager [phone] 12/12/2013, 8:56 AM  If 8PM-8AM, please contact night-coverage at [email or web address], password [facility]

## 2013-12-12 NOTE — Plan of Care (Signed)
Problem: Phase II Progression Outcomes Goal: Progress activity as tolerated unless otherwise ordered Outcome: Completed/Met Date Met:  12/12/13     

## 2013-12-12 NOTE — Progress Notes (Signed)
Name: MALLORY SCHAAD MRN: [id number] DOB: 06-15-1955    ADMISSION DATE:  12/08/2013 CONSULTATION DATE:  12/09/13  REFERRING MD :  Dr. Dyann Kief   CHIEF COMPLAINT:  Recurrent PNA  BRIEF PATIENT DESCRIPTION:  58 yo female former smoker with productive cough and RUL cavitary lesion.  Hx of recurrent admissions for PNA.   SIGNIFICANT EVENTS  10/4-10/11  Admit septic shock, pseudomonal bacteremia, HCAP and PE/DVT (on Xarelto). She was diagnosed HIT POSITIVE during that admission.   TEE on 9/16 was negative for valvular abnormalities.  STUDIES:  11/16  CT Chest >> no PE, dense RUL opacity, worrisome for cavitary PNA, tree-in-bud & patchy opacities RUL, B LL.  New from prior CT in 10/08/13 11/17 Quantiferon Gold >> negative 11/18 Echo >> EF 60 to 40%, grade 1 diastolic dysfx, mod LA dilation, PAS 32 mmHg  SUBJECTIVE:  Decreased cough.  No chest pain.  VITAL SIGNS: Temp:  [97.8 F (36.6 C)-98 F (36.7 C)] 97.8 F (36.6 C) (11/20 0502) Pulse Rate:  [56-80] 63 (11/20 0502) Resp:  [20] 20 (11/20 0502) BP: (109-111)/(72-75) 109/72 mmHg (11/20 0502) SpO2:  [98 %-99 %] 98 % (11/20 0847)  PHYSICAL EXAMINATION: General: pleasant Neuro: normal strength HEENT: no sinus tenderness Cardiovascular: regular Lungs: no wheeze Abdomen: soft, non tender Musculoskeletal: no edema Skin:  Warm/dry   CBC Recent Labs     12/11/13  0525  12/12/13  0500  WBC  3.3*  5.0  HGB  10.7*  10.3*  HCT  33.3*  31.6*  PLT  130*  126*   BMET Recent Labs     12/11/13  0525  12/12/13  0500  NA  135*  136*  K  4.6  4.3  CL  102  100  CO2  24  25  BUN  14  14  CREATININE  1.36*  1.21*  GLUCOSE  83  93    Electrolytes Recent Labs     12/11/13  0525  12/12/13  0500  CALCIUM  9.5  9.5   Glucose Recent Labs     12/10/13  0716  12/11/13  0754  12/12/13  0748  GLUCAP  94  81  116*    Imaging Dg Chest 2 View  12/12/2013   CLINICAL DATA:  Cavitary pneumonia. History of hypertension and  COPD.  EXAM: CHEST  2 VIEW  COMPARISON:  Chest CT and chest radiograph, 12/09/2018  FINDINGS: Right upper lobe consolidation is without change from the prior studies. There are no new areas of lung consolidation. No pleural effusion. No pneumothorax.  Cardiac silhouette is normal in size. Normal mediastinal and hilar contours.  Bony thorax is intact.  IMPRESSION: Stable area of right upper lobe consolidation. No new abnormalities.   Electronically Signed   By: Lajean Manes M.D.   On: 12/12/2013 10:21       ASSESSMENT / PLAN:  RUL cavitary lesion with hx of recurrent PNA.  Most likely bacterial.  Suspicion for TB is low, but not zero. Plan: Day 5 of abx, currently on aztreonam/clindamycin/vancomycin F/u Echo to assess for vegetation Defer bronchoscopy for now >> if no radiographic improvement, then will need to assess for bronchoscopy  Sputum AFB 11/17 >> smear negative >> Sputum 11/18 >> Sputum AFB 11/18 >> smear negative >> Sputum AFB 11/20 >>   Hx of COPD. Plan: Spiriva, symbicort and prn albuterol  Hx of PE/DVT from September 2015. Plan: Per primary team  Hx of polysubstance abuse (ETOH, cocaine, THC). Hx  of Cirrhosis from ETOH, Hep B. Plan: Per primary team  SUMMARY: Doubt TB.  Likely bacterial PNA.  Would continue IV Abx through weekend.  PCCM will f/u on 11/23, Monday >> call if help needed sooner.  Chesley Mires, MD [facility] Pulmonary/Critical Care 12/12/2013, 11:52 AM Pager:  [phone] After 3pm call: [phone]

## 2013-12-12 NOTE — Plan of Care (Signed)
Problem: Phase I Progression Outcomes Goal: OOB as tolerated unless otherwise ordered Outcome: Completed/Met Date Met:  12/12/13     

## 2013-12-13 LAB — BASIC METABOLIC PANEL
Anion gap: 7 (ref 5–15)
BUN: 14 mg/dL (ref 6–23)
CO2: 27 mEq/L (ref 19–32)
Calcium: 9.6 mg/dL (ref 8.4–10.5)
Chloride: 100 mEq/L (ref 96–112)
Creatinine, Ser: 1.05 mg/dL (ref 0.50–1.10)
GFR, EST AFRICAN AMERICAN: 67 mL/min — AB (ref >90)
GFR, EST NON AFRICAN AMERICAN: 58 mL/min — AB (ref >90)
Glucose, Bld: 87 mg/dL (ref 70–99)
POTASSIUM: 4.2 meq/L (ref 3.7–5.3)
Sodium: 134 mEq/L — ABNORMAL LOW (ref 137–147)

## 2013-12-13 LAB — CULTURE, RESPIRATORY

## 2013-12-13 LAB — VANCOMYCIN, TROUGH: Vancomycin Tr: 17.4 ug/mL (ref 10.0–20.0)

## 2013-12-13 LAB — GLUCOSE, CAPILLARY: Glucose-Capillary: 89 mg/dL (ref 70–99)

## 2013-12-13 LAB — CULTURE, RESPIRATORY W GRAM STAIN
Culture: NORMAL
Gram Stain: NONE SEEN

## 2013-12-13 NOTE — Plan of Care (Signed)
Problem: Phase I Progression Outcomes Goal: Pain controlled with appropriate interventions Outcome: Completed/Met Date Met:  12/13/13  Comments:  Patient able to sleep after pain meds

## 2013-12-13 NOTE — Plan of Care (Signed)
Problem: Phase I Progression Outcomes Goal: Hemodynamically stable Outcome: Completed/Met Date Met:  12/13/13     

## 2013-12-13 NOTE — Plan of Care (Signed)
Problem: Phase III Progression Outcomes Goal: Voiding independently Outcome: Completed/Met Date Met:  12/13/13

## 2013-12-13 NOTE — Plan of Care (Signed)
Problem: Phase II Progression Outcomes Goal: IV changed to normal saline lock Outcome: Completed/Met Date Met:  12/13/13  Comments:  kvo fluids

## 2013-12-13 NOTE — Plan of Care (Signed)
Problem: Phase III Progression Outcomes Goal: Pain controlled on oral analgesia Outcome: Completed/Met Date Met:  12/13/13

## 2013-12-13 NOTE — Progress Notes (Signed)
[facility] PROGRESS NOTE  [name] JGG:[id number] DOB: [birth year] DOA: 12/08/2013  PCP: Philis Fendt, MD  Brief HPI: 58 year old Caucasian female presented to the hospital with feelings of chest congestion and cough with grayish expectoration. She was admitted back in October with pneumonia. Chest x-ray was obtained which revealed a right upper lobe cavitary lesion. CT scan confirmed this finding, and it was felt that this was infectious in process. She was subsequently admitted to the hospital.  Past medical history:  Past Medical History  Diagnosis Date  . Hypertension   . COPD (chronic obstructive pulmonary disease)   . Cirrhosis   . Pulmonary emboli 09/2013  . Renal insufficiency   . Kidney stones   . Heart murmur   . DVT (deep venous thrombosis) 09/2013    RLE  . Pneumonia     "4 times in the past year" (10/30/2013)  . History of blood transfusion     "due to excessive blood loss before hysterectomy"  . GERD (gastroesophageal reflux disease)   . Hepatitis B   . Arthritis     "knees; lower back" (10/30/2013)  . Anxiety   . Depression   . Bipolar disorder     Consultants: Pulmonology  Procedures:  2D ECHO 11/18 Study Conclusions - Left ventricle: The cavity size was normal. Systolic function was normal. The estimated ejection fraction was in the range of 60%to 65%. Wall motion was normal; there were no regional wallmotion abnormalities. Doppler parameters are consistent withabnormal left ventricular relaxation (grade 1 diastolic dysfunction). - Left atrium: The atrium was moderately dilated. - Right atrium: The atrium was mildly dilated. - Atrial septum: No defect or patent foramen ovale was identified. - Pulmonary arteries: PA peak pressure: 32 mm Hg (S). - Pericardium, extracardiac: A small pericardial effusion was identified posterior to the heart.  Antibiotics:  Clindamycin   Vancomycin  Aztreonam  Subjective: Patient feels same. Not  producing as much sputum as before. Breathing is better  Objective: Vital Signs  Filed Vitals:   12/12/13 2049 12/12/13 2105 12/12/13 2115 12/13/13 0437  BP:  102/67  114/75  Pulse:  62 78 73  Temp:  98.2 F (36.8 C)  97.7 F (36.5 C)  TempSrc:  Oral  Oral  Resp:  18  20  Height:      Weight:      SpO2: 94% 94%  98%    Intake/Output Summary (Last 24 hours) at 12/13/13 5465 Last data filed at 12/13/13 0557  Gross per 24 hour  Intake 2020.16 ml  Output   1300 ml  Net 720.16 ml   Filed Weights   12/09/13 0912  Weight: 92 kg (202 lb 13.2 oz)     General appearance: alert, cooperative, appears stated age and no distress Resp: Few crackles right upper lung field. no wheezing. Cardio: regular rate and rhythm, S1, S2 normal, no murmur, click, rub or gallop GI: soft, non-tender; bowel sounds normal; no masses,  no organomegaly Extremities: extremities normal, atraumatic, no cyanosis or edema Neurologic: No focal deficits  Lab Results:  Basic Metabolic Panel:  Recent Labs Lab 12/08/13 1705 12/09/13 0441 12/11/13 0525 12/12/13 0500 12/13/13 0500  NA 131* 137 135* 136* 134*  K 4.0 3.7 4.6 4.3 4.2  CL 93* 99 102 100 100  CO2 18* 23 24 25 27   GLUCOSE 133* 111* 83 93 87  BUN 34* 30* 14 14 14   CREATININE 1.68* 1.42* 1.36* 1.21* 1.05  CALCIUM 10.1 9.2 9.5 9.5 9.6  Liver Function Tests:  Recent Labs Lab 12/09/13 0441  AST 14  ALT 11  ALKPHOS 68  BILITOT 0.3  PROT 7.1  ALBUMIN 3.2*   CBC:  Recent Labs Lab 12/08/13 1705 12/09/13 0441 12/11/13 0525 12/12/13 0500  WBC 10.8* 5.1 3.3* 5.0  HGB 13.0 10.5* 10.7* 10.3*  HCT 38.4 31.3* 33.3* 31.6*  MCV 88.9 89.9 92.5 91.1  PLT 128* 95* 130* 126*   BNP (last 3 results)  Recent Labs  12/21/12 1630 07/14/13 2120 12/08/13 1705  PROBNP 149.3* 40.7 145.8*   CBG:  Recent Labs Lab 12/10/13 0716 12/11/13 0754 12/12/13 0748  GLUCAP 94 81 116*    Recent Results (from the past 240 hour(s))  MRSA PCR  Screening     Status: Abnormal   Collection Time: 12/09/13  7:50 AM  Result Value Ref Range Status   MRSA by PCR POSITIVE (A) NEGATIVE Final    Comment:        The GeneXpert MRSA Assay (FDA approved for NASAL specimens only), is one component of a comprehensive MRSA colonization surveillance program. It is not intended to diagnose MRSA infection nor to guide or monitor treatment for MRSA infections. RESULT CALLED TO, READ BACK BY AND VERIFIED WITH: MACABUAG,E @ 1025 ON 111715 BY POTEAT,S   AFB culture with smear     Status: None (Preliminary result)   Collection Time: 12/09/13 11:12 PM  Result Value Ref Range Status   Specimen Description SPUTUM  Final   Special Requests NONE  Final   Acid Fast Smear   Final    NO ACID FAST BACILLI SEEN Performed at [facility]    Culture   Final    CULTURE WILL BE EXAMINED FOR 6 WEEKS BEFORE ISSUING A FINAL REPORT Performed at [facility]    Report Status PENDING  Incomplete  AFB culture with smear     Status: None (Preliminary result)   Collection Time: 12/10/13 10:20 PM  Result Value Ref Range Status   Specimen Description SPUTUM  Final   Special Requests NONE  Final   Acid Fast Smear   Final    NO ACID FAST BACILLI SEEN Performed at [facility]    Culture   Final    CULTURE WILL BE EXAMINED FOR 6 WEEKS BEFORE ISSUING A FINAL REPORT Performed at [facility]    Report Status PENDING  Incomplete  Culture, expectorated sputum-assessment     Status: None   Collection Time: 12/11/13  9:56 AM  Result Value Ref Range Status   Specimen Description SPUTUM  Final   Special Requests NONE  Final   Sputum evaluation   Final    THIS SPECIMEN IS ACCEPTABLE. RESPIRATORY CULTURE REPORT TO FOLLOW.   Report Status 12/11/2013 FINAL  Final  Culture, respiratory (NON-Expectorated)     Status: None (Preliminary result)   Collection Time: 12/11/13  9:56 AM  Result Value Ref Range Status   Specimen Description  SPUTUM  Final   Special Requests NONE  Final   Gram Stain   Final    NO WBC SEEN NO SQUAMOUS EPITHELIAL CELLS SEEN RARE GRAM POSITIVE COCCI IN PAIRS Performed at [facility]    Culture   Final    Culture reincubated for better growth Performed at [facility]    Report Status PENDING  Incomplete  AFB culture with smear     Status: None (Preliminary result)   Collection Time: 12/12/13  4:30 AM  Result Value Ref  Range Status   Specimen Description SPUTUM  Final   Special Requests NONE  Final   Acid Fast Smear   Final    NO ACID FAST BACILLI SEEN Performed at [facility]    Culture   Final    CULTURE WILL BE EXAMINED FOR 6 WEEKS BEFORE ISSUING A FINAL REPORT Performed at [facility]    Report Status PENDING  Incomplete      Studies/Results: Dg Chest 2 View  12/12/2013   CLINICAL DATA:  Cavitary pneumonia. History of hypertension and COPD.  EXAM: CHEST  2 VIEW  COMPARISON:  Chest CT and chest radiograph, 12/09/2018  FINDINGS: Right upper lobe consolidation is without change from the prior studies. There are no new areas of lung consolidation. No pleural effusion. No pneumothorax.  Cardiac silhouette is normal in size. Normal mediastinal and hilar contours.  Bony thorax is intact.  IMPRESSION: Stable area of right upper lobe consolidation. No new abnormalities.   Electronically Signed   By: Lajean Manes M.D.   On: 12/12/2013 10:21    Medications:  Scheduled: . aztreonam  2 g Intravenous 3 times per day  . budesonide-formoterol  2 puff Inhalation BID  . Chlorhexidine Gluconate Cloth  6 each Topical Q0600  . citalopram  20 mg Oral QHS  . clindamycin (CLEOCIN) IV  600 mg Intravenous 3 times per day  . entecavir  0.5 mg Oral QODAY  . folic acid  1 mg Oral Daily  . iloperidone  12 mg Oral Daily  . multivitamin with minerals  1 tablet Oral Daily  . mupirocin ointment  1 application Nasal BID  . pantoprazole  80 mg Oral Daily  . polyethylene  glycol  17 g Oral BID  . rivaroxaban  20 mg Oral Q supper  . senna  1 tablet Oral BID  . tenofovir  300 mg Oral QODAY  . thiamine  100 mg Oral Daily  . tiotropium  18 mcg Inhalation Daily  . traMADol  50 mg Oral BID  . vancomycin  750 mg Intravenous Q12H   Continuous: . sodium chloride 10 mL/hr at 12/12/13 0856   YIR:SWNIOEVOJ, ALPRAZolam, bisacodyl, ondansetron **OR** ondansetron (ZOFRAN) IV, oxyCODONE  Assessment/Plan:  Principal Problem:   Cavitary lesion of lung Active Problems:   Hepatitis B virus infection   Essential hypertension   Hepatic cirrhosis   COPD (chronic obstructive pulmonary disease)   Cavitary pneumonia    Cavitary Lesion on RUL Likely infectious. Continue broad spectrum coverage for now as per recommendations by Pulmonology. No plans for bronchoscopy for now. Serial AFBs are negative. Quantity from [facility] is negative. Can discontinue airborne precautions. HIV RNA was undetectable. ECHO as above. No obvious source noted. Right upper back pain likely due to the lesion. Repeat CXR 11/20 revealed stable findings.  Essential HTN Stable.   History of Pulmonary Embolism Diagnosed in September. Continue Rivaroxaban.  History of COPD Continue current inhalers. Patient is not wheezing at this moment. She stopped smoking after previous hospitalization.   Acute Kidney Injury Appears to be due to dehydration. Improved with IVF's.   History of Depression Continue citalopram  Dysphagia Patient with history of GERD and esophageal dilatation in past. This can be pursued as an outpatient. No urgent need to intervene at this time.  History of Liver Cirrhosis  Follows with [facility] Hepatology at [facility]. Holding diuretics due to renal failure. Could consider initiating at a lower dose in a day or 2.  History of  Hepatitis B  Continue with antiretrovirals.   DVT Prophylaxis: On Xarelto    Code Status: Full Code  Family Communication: Discussed with patient.  Discussed with her daughter on 11/20 Disposition Plan: Not ready for discharge. Mobilize    LOS: 5 days   [facility] Pager [phone] 12/13/2013, 7:52 AM  If 8PM-8AM, please contact night-coverage at [email or web address], password [employer]

## 2013-12-13 NOTE — Progress Notes (Addendum)
ANTIBIOTIC CONSULT NOTE - Follow up  Pharmacy Consult for Vancomycin and Aztreonam Indication: pneumonia  Allergies  Allergen Reactions  . Elavil [Amitriptyline] Other (See Comments)    Causes her to be very "disoriented".  . Heparin Other (See Comments)    HIT ab positive with elevated OD  . Duloxetine Other (See Comments)    REACTION: Headache  . Gabapentin Other (See Comments)    REACTION: rash in mouth  . Neomycin-Bacitracin Zn-Polymyx Other (See Comments)    REACTION: rash/hives    Patient Measurements: Height: 5\' 4"  (162.6 cm) Weight: 202 lb 13.2 oz (92 kg) IBW/kg (Calculated) : 54.7 ABW: 70kg   Assessment: 58 yo female presents 11/16 with chest pain and shortness of breath. Hx HBV- on treatment. CTa shows dense right upper lobe opacity worrisome for cavitary pneumonia. Pharmacy is consulted to dose vancomycin and aztreonam; patient received a dose of zosyn in the ED. History pseudomonas in blood trach aspirate from 10/26/13 pansensitive. Per patient, has had 4 episodes of PNA.  Antiinfectives 11/16 pm >> Vancomycin >> 11/17 >> Aztreonam >> 11/16 pm >> Clinda (MD) >>  Microbiology 11/16 Quanteferon TB gold: neg 11/17 MRSA PCR: POSITIVE 11/17 AFB sputum: ngtd 11/18 AFB sputum: ngtd 11/19 sputum: rare GPC pairs (reincubating) 11/20 AFB sputum: ngtd  Levels / Dose changes: 11/19: empiric change in vancomycin dose from 1500mg  q24 to 750mg  q12h with updated weight and improved renal function 11/21 trough at 13:30 = ____ before 5th dose of 750mg  q12h  Today, 12/13/2013 D5 Vanc / Aztreonam / Clindamycin for new cavitary PNA. No PCN allergy- started on clinda/vanc/aztreonam as MD wants broader anaerobic coverage with new cavitary lesions. Pulmonary following. Ruling out TB and vegetations per ECHO.   Temp: remains afebrile  WBC: normalized to 5k  Renal: SCr improved to baseline, 1.05, CrCl 65 (CG/N)   Goal of Therapy:  Vancomycin trough level 15-20 mcg/ml   Aztreonam dose per renal function  Plan:   Change vancomycin to 750mg  IV q12h for now, check steady state trough at 13:30 today  Continue aztreonam 2g IV q8h (and clindamycin 600mg  IV q8h per MD)  Follow up renal function & cultures, clinical course  Peggyann Juba, PharmD, BCPS Pager: [phone] Pharmacy: [phone] 12/13/2013 10:23 AM   Addendum: Trough = 17.4 mcg/ml which is therapeutic, continue same dose.  Peggyann Juba, PharmD, BCPS 12/13/2013 2:11 PM

## 2013-12-14 LAB — GLUCOSE, CAPILLARY: Glucose-Capillary: 80 mg/dL (ref 70–99)

## 2013-12-14 NOTE — Progress Notes (Signed)
[facility] PROGRESS NOTE  [name] OXB:[id number] DOB: [birth year] DOA: 12/08/2013  PCP: Philis Fendt, MD  Brief HPI: 58 year old Caucasian female presented to the hospital with feelings of chest congestion and cough with grayish expectoration. She was admitted back in October with pneumonia. Chest x-ray was obtained which revealed a right upper lobe cavitary lesion. CT scan confirmed this finding, and it was felt that this was infectious in process. She was subsequently admitted to the hospital.  Past medical history:  Past Medical History  Diagnosis Date  . Hypertension   . COPD (chronic obstructive pulmonary disease)   . Cirrhosis   . Pulmonary emboli 09/2013  . Renal insufficiency   . Kidney stones   . Heart murmur   . DVT (deep venous thrombosis) 09/2013    RLE  . Pneumonia     "4 times in the past year" (10/30/2013)  . History of blood transfusion     "due to excessive blood loss before hysterectomy"  . GERD (gastroesophageal reflux disease)   . Hepatitis B   . Arthritis     "knees; lower back" (10/30/2013)  . Anxiety   . Depression   . Bipolar disorder     Consultants: Pulmonology  Procedures:  2D ECHO 11/18 Study Conclusions - Left ventricle: The cavity size was normal. Systolic function was normal. The estimated ejection fraction was in the range of 60%to 65%. Wall motion was normal; there were no regional wallmotion abnormalities. Doppler parameters are consistent withabnormal left ventricular relaxation (grade 1 diastolic dysfunction). - Left atrium: The atrium was moderately dilated. - Right atrium: The atrium was mildly dilated. - Atrial septum: No defect or patent foramen ovale was identified. - Pulmonary arteries: PA peak pressure: 32 mm Hg (S). - Pericardium, extracardiac: A small pericardial effusion was identified posterior to the heart.  Antibiotics:  Clindamycin   Vancomycin  Aztreonam  Subjective: Patient feels better. No  new complaints.   Objective: Vital Signs  Filed Vitals:   12/13/13 1405 12/13/13 2101 12/13/13 2126 12/14/13 0538  BP:   105/65 116/71  Pulse: 67  56 64  Temp:   97.7 F (36.5 C) 97.5 F (36.4 C)  TempSrc:   Oral Oral  Resp:   17 18  Height:      Weight:      SpO2: 92% 95% 96% 96%    Intake/Output Summary (Last 24 hours) at 12/14/13 0850 Last data filed at 12/14/13 6834  Gross per 24 hour  Intake   2020 ml  Output   1050 ml  Net    970 ml   Filed Weights   12/09/13 0912  Weight: 92 kg (202 lb 13.2 oz)     General appearance: alert, cooperative, appears stated age and no distress Resp: Few crackles/rhonchi right upper lung field. no wheezing. Cardio: regular rate and rhythm, S1, S2 normal, no murmur, click, rub or gallop GI: soft, non-tender; bowel sounds normal; no masses,  no organomegaly Extremities: extremities normal, atraumatic, no cyanosis or edema Neurologic: No focal deficits  Lab Results:  Basic Metabolic Panel:  Recent Labs Lab 12/08/13 1705 12/09/13 0441 12/11/13 0525 12/12/13 0500 12/13/13 0500  NA 131* 137 135* 136* 134*  K 4.0 3.7 4.6 4.3 4.2  CL 93* 99 102 100 100  CO2 18* 23 24 25 27   GLUCOSE 133* 111* 83 93 87  BUN 34* 30* 14 14 14   CREATININE 1.68* 1.42* 1.36* 1.21* 1.05  CALCIUM 10.1 9.2 9.5 9.5 9.6  Liver Function Tests:  Recent Labs Lab 12/09/13 0441  AST 14  ALT 11  ALKPHOS 68  BILITOT 0.3  PROT 7.1  ALBUMIN 3.2*   CBC:  Recent Labs Lab 12/08/13 1705 12/09/13 0441 12/11/13 0525 12/12/13 0500  WBC 10.8* 5.1 3.3* 5.0  HGB 13.0 10.5* 10.7* 10.3*  HCT 38.4 31.3* 33.3* 31.6*  MCV 88.9 89.9 92.5 91.1  PLT 128* 95* 130* 126*   BNP (last 3 results)  Recent Labs  12/21/12 1630 07/14/13 2120 12/08/13 1705  PROBNP 149.3* 40.7 145.8*   CBG:  Recent Labs Lab 12/10/13 0716 12/11/13 0754 12/12/13 0748 12/13/13 0739 12/14/13 0743  GLUCAP 94 81 116* 89 80    Recent Results (from the past 240 hour(s))    MRSA PCR Screening     Status: Abnormal   Collection Time: 12/09/13  7:50 AM  Result Value Ref Range Status   MRSA by PCR POSITIVE (A) NEGATIVE Final    Comment:        The GeneXpert MRSA Assay (FDA approved for NASAL specimens only), is one component of a comprehensive MRSA colonization surveillance program. It is not intended to diagnose MRSA infection nor to guide or monitor treatment for MRSA infections. RESULT CALLED TO, READ BACK BY AND VERIFIED WITH: MACABUAG,E @ 1025 ON 111715 BY POTEAT,S   AFB culture with smear     Status: None (Preliminary result)   Collection Time: 12/09/13 11:12 PM  Result Value Ref Range Status   Specimen Description SPUTUM  Final   Special Requests NONE  Final   Acid Fast Smear   Final    NO ACID FAST BACILLI SEEN Performed at [facility]    Culture   Final    CULTURE WILL BE EXAMINED FOR 6 WEEKS BEFORE ISSUING A FINAL REPORT Performed at [facility]    Report Status PENDING  Incomplete  AFB culture with smear     Status: None (Preliminary result)   Collection Time: 12/10/13 10:20 PM  Result Value Ref Range Status   Specimen Description SPUTUM  Final   Special Requests NONE  Final   Acid Fast Smear   Final    NO ACID FAST BACILLI SEEN Performed at [facility]    Culture   Final    CULTURE WILL BE EXAMINED FOR 6 WEEKS BEFORE ISSUING A FINAL REPORT Performed at [facility]    Report Status PENDING  Incomplete  Culture, expectorated sputum-assessment     Status: None   Collection Time: 12/11/13  9:56 AM  Result Value Ref Range Status   Specimen Description SPUTUM  Final   Special Requests NONE  Final   Sputum evaluation   Final    THIS SPECIMEN IS ACCEPTABLE. RESPIRATORY CULTURE REPORT TO FOLLOW.   Report Status 12/11/2013 FINAL  Final  Culture, respiratory (NON-Expectorated)     Status: None   Collection Time: 12/11/13  9:56 AM  Result Value Ref Range Status   Specimen Description SPUTUM  Final    Special Requests NONE  Final   Gram Stain   Final    NO WBC SEEN NO SQUAMOUS EPITHELIAL CELLS SEEN RARE GRAM POSITIVE COCCI IN PAIRS Performed at [facility]    Culture   Final    NORMAL OROPHARYNGEAL FLORA Performed at [facility]    Report Status 12/13/2013 FINAL  Final  AFB culture with smear     Status: None (Preliminary result)   Collection Time: 12/12/13  4:30 AM  Result Value Ref Range Status   Specimen Description SPUTUM  Final   Special Requests NONE  Final   Acid Fast Smear   Final    NO ACID FAST BACILLI SEEN Performed at [facility]    Culture   Final    CULTURE WILL BE EXAMINED FOR 6 WEEKS BEFORE ISSUING A FINAL REPORT Performed at [facility]    Report Status PENDING  Incomplete      Studies/Results: Dg Chest 2 View  12/12/2013   CLINICAL DATA:  Cavitary pneumonia. History of hypertension and COPD.  EXAM: CHEST  2 VIEW  COMPARISON:  Chest CT and chest radiograph, 12/09/2018  FINDINGS: Right upper lobe consolidation is without change from the prior studies. There are no new areas of lung consolidation. No pleural effusion. No pneumothorax.  Cardiac silhouette is normal in size. Normal mediastinal and hilar contours.  Bony thorax is intact.  IMPRESSION: Stable area of right upper lobe consolidation. No new abnormalities.   Electronically Signed   By: Lajean Manes M.D.   On: 12/12/2013 10:21    Medications:  Scheduled: . aztreonam  2 g Intravenous 3 times per day  . budesonide-formoterol  2 puff Inhalation BID  . citalopram  20 mg Oral QHS  . clindamycin (CLEOCIN) IV  600 mg Intravenous 3 times per day  . entecavir  0.5 mg Oral QODAY  . folic acid  1 mg Oral Daily  . iloperidone  12 mg Oral Daily  . multivitamin with minerals  1 tablet Oral Daily  . mupirocin ointment  1 application Nasal BID  . pantoprazole  80 mg Oral Daily  . polyethylene glycol  17 g Oral BID  . rivaroxaban  20 mg Oral Q supper  . senna  1 tablet  Oral BID  . tenofovir  300 mg Oral QODAY  . thiamine  100 mg Oral Daily  . tiotropium  18 mcg Inhalation Daily  . traMADol  50 mg Oral BID  . vancomycin  750 mg Intravenous Q12H   Continuous: . sodium chloride 10 mL/hr at 12/12/13 0856   TWS:[id number], ALPRAZolam, bisacodyl, ondansetron **OR** ondansetron (ZOFRAN) IV, oxyCODONE  Assessment/Plan:  Principal Problem:   Cavitary lesion of lung Active Problems:   Hepatitis B virus infection   Essential hypertension   Hepatic cirrhosis   COPD (chronic obstructive pulmonary disease)   Cavitary pneumonia    Cavitary Lesion on RUL Likely infectious. Continue broad spectrum coverage for now as per recommendations by Pulmonology. No plans for bronchoscopy for now. Serial AFBs are negative. Quantity from [facility] is negative. HIV RNA was undetectable. ECHO as above. No obvious source noted. Right upper back pain likely due to the lesion. Repeat CXR 11/20 revealed stable findings. Discussed with Dr. Halford Chessman today. Repeat chest x-ray tomorrow morning and pulmonology will make further recommendations regarding antibiotic at that time.  Essential HTN Stable.   History of Pulmonary Embolism Diagnosed in September. Continue Rivaroxaban.  History of COPD Continue current inhalers. Patient is not wheezing at this moment. She stopped smoking after previous hospitalization.   Acute Kidney Injury Appears to be due to dehydration. Improved with IVF's.   History of Depression Continue citalopram  Dysphagia Patient with history of GERD and esophageal dilatation in past. This can be pursued as an outpatient. No urgent need to intervene at this time.  History of Liver Cirrhosis  Follows with [facility] Hepatology at [facility]. Holding diuretics due to renal failure. Can resume at discharge.  History of  Hepatitis B  Continue with antiretrovirals.   DVT Prophylaxis: On Xarelto    Code Status: Full Code  Family Communication: Discussed with patient.  Discussed with her daughter on 11/20 Disposition Plan: Hopefully discharge in 1-2 days.    LOS: 6 days   [facility] Pager [phone] 12/14/2013, 8:50 AM  If 8PM-8AM, please contact night-coverage at [email or web address], password [facility]

## 2013-12-15 ENCOUNTER — Inpatient Hospital Stay (HOSPITAL_COMMUNITY): Payer: Medicare Other

## 2013-12-15 LAB — CBC
HCT: 34.1 % — ABNORMAL LOW (ref 36.0–46.0)
Hemoglobin: 11 g/dL — ABNORMAL LOW (ref 12.0–15.0)
MCH: 29.9 pg (ref 26.0–34.0)
MCHC: 32.3 g/dL (ref 30.0–36.0)
MCV: 92.7 fL (ref 78.0–100.0)
Platelets: 126 10*3/uL — ABNORMAL LOW (ref 150–400)
RBC: 3.68 MIL/uL — ABNORMAL LOW (ref 3.87–5.11)
RDW: 14.8 % (ref 11.5–15.5)
WBC: 4 10*3/uL (ref 4.0–10.5)

## 2013-12-15 LAB — BASIC METABOLIC PANEL
Anion gap: 8 (ref 5–15)
BUN: 16 mg/dL (ref 6–23)
CO2: 28 mEq/L (ref 19–32)
Calcium: 9.8 mg/dL (ref 8.4–10.5)
Chloride: 105 mEq/L (ref 96–112)
Creatinine, Ser: 1.05 mg/dL (ref 0.50–1.10)
GFR calc Af Amer: 67 mL/min — ABNORMAL LOW (ref >90)
GFR calc non Af Amer: 58 mL/min — ABNORMAL LOW (ref >90)
Glucose, Bld: 110 mg/dL — ABNORMAL HIGH (ref 70–99)
Potassium: 5 mEq/L (ref 3.7–5.3)
Sodium: 141 mEq/L (ref 137–147)

## 2013-12-15 LAB — GLUCOSE, CAPILLARY: GLUCOSE-CAPILLARY: 86 mg/dL (ref 70–99)

## 2013-12-15 MED ORDER — MAGIC MOUTHWASH W/LIDOCAINE
5.0000 mL | Freq: Four times a day (QID) | ORAL | Status: DC
  Filled 2013-12-15 (×4): qty 5

## 2013-12-15 MED ORDER — LEVOFLOXACIN 500 MG PO TABS
500.0000 mg | ORAL_TABLET | Freq: Every day | ORAL | Status: DC
  Administered 2013-12-15 – 2013-12-16 (×2): 500 mg via ORAL
  Filled 2013-12-15 (×2): qty 1

## 2013-12-15 NOTE — Progress Notes (Signed)
[facility] PROGRESS NOTE  [name] EAV:[id number] DOB: 13-Sep-1955 DOA: 12/08/2013  PCP: Philis Fendt, MD  Brief HPI: 58 year old Caucasian female presented to the hospital with feelings of chest congestion and cough with grayish expectoration. She was admitted back in October with pneumonia. Chest x-ray was obtained which revealed a right upper lobe cavitary lesion. CT scan confirmed this finding, and it was felt that this was infectious in process. She was subsequently admitted to the hospital.  Past medical history:  Past Medical History  Diagnosis Date  . Hypertension   . COPD (chronic obstructive pulmonary disease)   . Cirrhosis   . Pulmonary emboli 09/2013  . Renal insufficiency   . Kidney stones   . Heart murmur   . DVT (deep venous thrombosis) 09/2013    RLE  . Pneumonia     "4 times in the past year" (10/30/2013)  . History of blood transfusion     "due to excessive blood loss before hysterectomy"  . GERD (gastroesophageal reflux disease)   . Hepatitis B   . Arthritis     "knees; lower back" (10/30/2013)  . Anxiety   . Depression   . Bipolar disorder     Consultants: Pulmonology  Procedures:  2D ECHO 11/18 Study Conclusions - Left ventricle: The cavity size was normal. Systolic function was normal. The estimated ejection fraction was in the range of 60%to 65%. Wall motion was normal; there were no regional wallmotion abnormalities. Doppler parameters are consistent withabnormal left ventricular relaxation (grade 1 diastolic dysfunction). - Left atrium: The atrium was moderately dilated. - Right atrium: The atrium was mildly dilated. - Atrial septum: No defect or patent foramen ovale was identified. - Pulmonary arteries: PA peak pressure: 32 mm Hg (S). - Pericardium, extracardiac: A small pericardial effusion was identified posterior to the heart.  Antibiotics:  Clindamycin/Vancomycin/Aztreonam till 11/23  Levaquin  11/23  Subjective: Patient continues to feel better. Pain persists in upper back but improving.   Objective: Vital Signs  Filed Vitals:   12/14/13 0538 12/14/13 1400 12/14/13 2130 12/15/13 0548  BP: 116/71 105/71 90/55 113/67  Pulse: 64 57 62 52  Temp: 97.5 F (36.4 C) 98 F (36.7 C) 97.9 F (36.6 C) 98.1 F (36.7 C)  TempSrc: Oral Oral Oral Oral  Resp: 18 18 16 16   Height:      Weight:      SpO2: 96% 98% 94% 98%    Intake/Output Summary (Last 24 hours) at 12/15/13 0854 Last data filed at 12/14/13 2300  Gross per 24 hour  Intake   1320 ml  Output   2000 ml  Net   -680 ml   Filed Weights   12/09/13 0912  Weight: 92 kg (202 lb 13.2 oz)     General appearance: alert, cooperative, appears stated age and no distress Resp: Few crackles/rhonchi right upper lung field. no wheezing. Improved air entry. Cardio: regular rate and rhythm, S1, S2 normal, no murmur, click, rub or gallop GI: soft, non-tender; bowel sounds normal; no masses,  no organomegaly Neurologic: No focal deficits  Lab Results:  Basic Metabolic Panel:  Recent Labs Lab 12/09/13 0441 12/11/13 0525 12/12/13 0500 12/13/13 0500 12/15/13 0439  NA 137 135* 136* 134* 141  K 3.7 4.6 4.3 4.2 5.0  CL 99 102 100 100 105  CO2 23 24 25 27 28   GLUCOSE 111* 83 93 87 110*  BUN 30* 14 14 14 16   CREATININE 1.42* 1.36* 1.21* 1.05 1.05  CALCIUM 9.2  9.5 9.5 9.6 9.8   Liver Function Tests:  Recent Labs Lab 12/09/13 0441  AST 14  ALT 11  ALKPHOS 68  BILITOT 0.3  PROT 7.1  ALBUMIN 3.2*   CBC:  Recent Labs Lab 12/08/13 1705 12/09/13 0441 12/11/13 0525 12/12/13 0500 12/15/13 0439  WBC 10.8* 5.1 3.3* 5.0 4.0  HGB 13.0 10.5* 10.7* 10.3* 11.0*  HCT 38.4 31.3* 33.3* 31.6* 34.1*  MCV 88.9 89.9 92.5 91.1 92.7  PLT 128* 95* 130* 126* 126*   BNP (last 3 results)  Recent Labs  12/21/12 1630 07/14/13 2120 12/08/13 1705  PROBNP 149.3* 40.7 145.8*   CBG:  Recent Labs Lab 12/11/13 0754  12/12/13 0748 12/13/13 0739 12/14/13 0743 12/15/13 0746  GLUCAP 81 116* 89 80 86    Recent Results (from the past 240 hour(s))  MRSA PCR Screening     Status: Abnormal   Collection Time: 12/09/13  7:50 AM  Result Value Ref Range Status   MRSA by PCR POSITIVE (A) NEGATIVE Final    Comment:        The GeneXpert MRSA Assay (FDA approved for NASAL specimens only), is one component of a comprehensive MRSA colonization surveillance program. It is not intended to diagnose MRSA infection nor to guide or monitor treatment for MRSA infections. RESULT CALLED TO, READ BACK BY AND VERIFIED WITH: MACABUAG,E @ 1025 ON [id number] BY POTEAT,S   AFB culture with smear     Status: None (Preliminary result)   Collection Time: 12/09/13 11:12 PM  Result Value Ref Range Status   Specimen Description SPUTUM  Final   Special Requests NONE  Final   Acid Fast Smear   Final    NO ACID FAST BACILLI SEEN Performed at [facility]    Culture   Final    CULTURE WILL BE EXAMINED FOR 6 WEEKS BEFORE ISSUING A FINAL REPORT Performed at [facility]    Report Status PENDING  Incomplete  AFB culture with smear     Status: None (Preliminary result)   Collection Time: 12/10/13 10:20 PM  Result Value Ref Range Status   Specimen Description SPUTUM  Final   Special Requests NONE  Final   Acid Fast Smear   Final    NO ACID FAST BACILLI SEEN Performed at [facility]    Culture   Final    CULTURE WILL BE EXAMINED FOR 6 WEEKS BEFORE ISSUING A FINAL REPORT Performed at [facility]    Report Status PENDING  Incomplete  Culture, expectorated sputum-assessment     Status: None   Collection Time: 12/11/13  9:56 AM  Result Value Ref Range Status   Specimen Description SPUTUM  Final   Special Requests NONE  Final   Sputum evaluation   Final    THIS SPECIMEN IS ACCEPTABLE. RESPIRATORY CULTURE REPORT TO FOLLOW.   Report Status 12/11/2013 FINAL  Final  Culture, respiratory  (NON-Expectorated)     Status: None   Collection Time: 12/11/13  9:56 AM  Result Value Ref Range Status   Specimen Description SPUTUM  Final   Special Requests NONE  Final   Gram Stain   Final    NO WBC SEEN NO SQUAMOUS EPITHELIAL CELLS SEEN RARE GRAM POSITIVE COCCI IN PAIRS Performed at [facility]    Culture   Final    NORMAL OROPHARYNGEAL FLORA Performed at [facility]    Report Status 12/13/2013 FINAL  Final  AFB culture with smear  Status: None (Preliminary result)   Collection Time: 12/12/13  4:30 AM  Result Value Ref Range Status   Specimen Description SPUTUM  Final   Special Requests NONE  Final   Acid Fast Smear   Final    NO ACID FAST BACILLI SEEN Performed at [facility]    Culture   Final    CULTURE WILL BE EXAMINED FOR 6 WEEKS BEFORE ISSUING A FINAL REPORT Performed at [facility]    Report Status PENDING  Incomplete      Studies/Results: Dg Chest 2 View  12/15/2013   CLINICAL DATA:  58 year old female with recent diagnosis of right upper lobe pneumonia and persistent right upper chest pain. Persistent right chest pain. Feeling improved compared to last week.  EXAM: CHEST  2 VIEW  COMPARISON:  Most recent prior chest x-ray 12/12/2013; most recent prior chest CT 12/08/2013  FINDINGS: Cardiac and mediastinal contours remain within normal limits. There has been no significant interval change in the rounded opacity in the right upper lung over the 3 days since the prior chest x-ray. The opacity measures approximately 3.3 x 4.0 cm. Persistent trace linear pleural-parenchymal scarring in the inferior lingula. The remainder the lungs remain clear. No acute osseous abnormality. No pleural effusion or pneumothorax.  IMPRESSION: 1. No significant interval change in the rounded right upper lobe opacity in the 3 days since the most recent prior chest x-ray. Recommend continued followup to resolution. Consider repeat chest x-ray in 4 weeks.    Electronically Signed   By: Jacqulynn Cadet M.D.   On: 12/15/2013 08:30    Medications:  Scheduled: . aztreonam  2 g Intravenous 3 times per day  . budesonide-formoterol  2 puff Inhalation BID  . citalopram  20 mg Oral QHS  . clindamycin (CLEOCIN) IV  600 mg Intravenous 3 times per day  . entecavir  0.5 mg Oral QODAY  . folic acid  1 mg Oral Daily  . iloperidone  12 mg Oral Daily  . multivitamin with minerals  1 tablet Oral Daily  . pantoprazole  80 mg Oral Daily  . polyethylene glycol  17 g Oral BID  . rivaroxaban  20 mg Oral Q supper  . senna  1 tablet Oral BID  . tenofovir  300 mg Oral QODAY  . thiamine  100 mg Oral Daily  . tiotropium  18 mcg Inhalation Daily  . traMADol  50 mg Oral BID  . vancomycin  750 mg Intravenous Q12H   Continuous: . sodium chloride 10 mL/hr at 12/12/13 0856   TIW:PYKDXIPJA, ALPRAZolam, bisacodyl, ondansetron **OR** ondansetron (ZOFRAN) IV, oxyCODONE  Assessment/Plan:  Principal Problem:   Cavitary lesion of lung Active Problems:   Hepatitis B virus infection   Essential hypertension   Hepatic cirrhosis   COPD (chronic obstructive pulmonary disease)   Cavitary pneumonia    Cavitary Lesion on RUL Likely infectious. Pulmonology recommends Levaquin. Will change to same. She will need repeat imaging in 4 weeks. No plans for bronchoscopy at this time. Serial AFBs are negative. Quantity from Gold is negative. HIV RNA was undetectable. ECHO as above. No obvious source noted. Right upper back pain likely due to the lesion. Repeat CXR's revealed stable findings.   Essential HTN Stable.   History of Pulmonary Embolism Diagnosed in September. Continue Rivaroxaban.  History of COPD Continue current inhalers. Patient is not wheezing at this moment. She stopped smoking after previous hospitalization.   Acute Kidney Injury Appears to be due to dehydration. Improved  with IVF's.   History of Depression Continue citalopram  Dysphagia Patient  with history of GERD and esophageal dilatation in past. This can be pursued as an outpatient. No urgent need to intervene at this time.  History of Liver Cirrhosis  Follows with transplant Hepatology at [facility]. Holding diuretics due to renal failure. Can resume at discharge.  History of  Hepatitis B  Continue with antiretrovirals.   DVT Prophylaxis: On Xarelto    Code Status: Full Code  Family Communication: Discussed with patient. Discussed with her daughter on 11/20 Disposition Plan: Hopefully discharge in AM    LOS: 7 days   [facility] Pager [phone] 12/15/2013, 8:54 AM  If 8PM-8AM, please contact night-coverage at [email or web address], password [facility]

## 2013-12-15 NOTE — Progress Notes (Signed)
Name: [name] MRN: [id number] DOB: 02-Mar-1955    ADMISSION DATE:  12/08/2013 CONSULTATION DATE:  12/09/13  REFERRING MD :  Dr. Dyann Kief   CHIEF COMPLAINT:  Recurrent PNA  BRIEF PATIENT DESCRIPTION:  58 yo female former smoker with productive cough and RUL cavitary lesion.  Hx of recurrent admissions for PNA.  SIGNIFICANT EVENTS  10/4-10/11  Admit septic shock, pseudomonal bacteremia, HCAP and PE/DVT (on Xarelto). She was diagnosed HIT POSITIVE during that admission.   TEE on 9/16 was negative for valvular abnormalities.  STUDIES:  11/16  CT Chest >> no PE, dense RUL opacity, worrisome for cavitary PNA, tree-in-bud & patchy opacities RUL, B LL.  New from prior CT in 10/08/13 11/17 Quantiferon Gold >> negative 11/18 Echo >> EF 60 to 34%, grade 1 diastolic dysfx, mod LA dilation, PAS 32 mmHg  SUBJECTIVE:  Decreased cough.  No chest pain.  VITAL SIGNS: Temp:  [97.9 F (36.6 C)-98.1 F (36.7 C)] 98.1 F (36.7 C) (11/23 0548) Pulse Rate:  [52-62] 52 (11/23 0548) Resp:  [16-18] 16 (11/23 0548) BP: (90-113)/(55-71) 113/67 mmHg (11/23 0548) SpO2:  [94 %-98 %] 94 % (11/23 0927)  PHYSICAL EXAMINATION: General: pleasant Neuro: normal strength HEENT: no sinus tenderness Cardiovascular: regular Lungs: no wheeze Abdomen: soft, non tender Musculoskeletal: no edema Skin:  Warm/dry   CBC Recent Labs     12/15/13  0439  WBC  4.0  HGB  11.0*  HCT  34.1*  PLT  126*   BMET Recent Labs     12/13/13  0500  12/15/13  0439  NA  134*  141  K  4.2  5.0  CL  100  105  CO2  27  28  BUN  14  16  CREATININE  1.05  1.05  GLUCOSE  87  110*    Electrolytes Recent Labs     12/13/13  0500  12/15/13  0439  CALCIUM  9.6  9.8   Glucose Recent Labs     12/13/13  0739  12/14/13  0743  12/15/13  0746  GLUCAP  89  80  86    Imaging Dg Chest 2 View  12/15/2013   CLINICAL DATA:  58 year old female with recent diagnosis of right upper lobe pneumonia and persistent right  upper chest pain. Persistent right chest pain. Feeling improved compared to last week.  EXAM: CHEST  2 VIEW  COMPARISON:  Most recent prior chest x-ray 12/12/2013; most recent prior chest CT 12/08/2013  FINDINGS: Cardiac and mediastinal contours remain within normal limits. There has been no significant interval change in the rounded opacity in the right upper lung over the 3 days since the prior chest x-ray. The opacity measures approximately 3.3 x 4.0 cm. Persistent trace linear pleural-parenchymal scarring in the inferior lingula. The remainder the lungs remain clear. No acute osseous abnormality. No pleural effusion or pneumothorax.  IMPRESSION: 1. No significant interval change in the rounded right upper lobe opacity in the 3 days since the most recent prior chest x-ray. Recommend continued followup to resolution. Consider repeat chest x-ray in 4 weeks.   Electronically Signed   By: Jacqulynn Cadet M.D.   On: 12/15/2013 08:30    ASSESSMENT / PLAN:  RUL cavitary lesion with hx of recurrent PNA.  Most likely bacterial.  Suspicion for TB is low, but not zero. History of pseudomonas sputum 10/26/13 ss cipro. Echo to assess for vegetation>> 11/18 no vegetations Plans:  Day 8 of abx, currently on aztreonam/clindamycin/vancomycin. Would  transition her to PO regimen. She has hx pseudomonas but no positive cx data this time. She is at risk for anaerobes. Would probably treat with levaquin for an extended course of 21 days total (13 more days), then repeat her CT chest in about 4-6 weeks to look for resolution. If the opacity persists then she will need bronchoscopy and biopsy.   Sputum AFB 11/17 >> smear negative >> Sputum 11/18 >>Nl [place] Sputum AFB 11/18 >> smear negative >> Sputum AFB 11/20 >> NO AFB>>  Hx of COPD. Plan: Spiriva, symbicort and prn albuterol  Hx of PE/DVT from September 2015. Plan: Per primary team  Hx of polysubstance abuse (ETOH, cocaine, THC). Hx of Cirrhosis from ETOH, Hep  B. Plan: Per primary team   Richardson Landry Minor ACNP [facility] PCC[facility] Pager [phone] till 3 pm If no answer page [phone] 12/15/2013, 11:24 AM  Attending Note I have examined the patient, reviewed notes, labs and studies. I have discussed the case with S Minor and agree with the data and plans as amended in the note above. She has a rounded opacity new from 9/'15, likely PNA. Agree with treatment x 21 days to include pseudomonal coverage. Will need repeat imaging in 4-6 weeks and then follow to decide whether FOB is indicated.   Baltazar Apo, MD, PhD 12/15/2013, 1:38 PM [facility] [phone] or if no answer [phone]

## 2013-12-15 NOTE — Progress Notes (Signed)
Nutrition Brief Note  Patient identified on the Malnutrition Screening Tool (MST) Report  Wt Readings from Last 15 Encounters:  12/09/13 202 lb 13.2 oz (92 kg)  11/01/13 246 lb 8 oz (111.812 kg)  10/08/13 204 lb 5.9 oz (92.7 kg)  07/14/13 210 lb (95.255 kg)  12/24/12 228 lb 9.9 oz (103.7 kg)  11/28/10 255 lb 1.2 oz (115.7 kg)  09/01/08 202 lb (91.627 kg)    Body mass index is 34.8 kg/(m^2). Patient meets criteria for obesity grade 1 based on current BMI.   Current diet order is regular, patient is consuming approximately 100% of meals at this time. Labs and medications reviewed.   Chart reviewed.  Hx includes admission and TF on 10/27/13.  Patient's weight is fairly stable and patient reports that she has been trying to lose weights.  Reports good intake.  Noted that patient is followed by the transplant Hepatology at [facility].     If nutrition issues arise, please consult RD.   Antonieta Iba, RD, LDN Clinical Inpatient Dietitian Pager:  [phone] Weekend and after hours pager:  [phone]

## 2013-12-16 LAB — GLUCOSE, CAPILLARY: Glucose-Capillary: 105 mg/dL — ABNORMAL HIGH (ref 70–99)

## 2013-12-16 MED ORDER — OXYCODONE HCL 5 MG PO TABS: 5.0000 mg | ORAL_TABLET | ORAL | Status: DC | PRN

## 2013-12-16 MED ORDER — LEVOFLOXACIN 500 MG PO TABS: 500.0000 mg | ORAL_TABLET | Freq: Every day | ORAL | Status: DC

## 2013-12-16 MED ORDER — SPIRONOLACTONE 50 MG PO TABS: 100.0000 mg | ORAL_TABLET | Freq: Every day | ORAL | Status: DC

## 2013-12-16 NOTE — Discharge Summary (Addendum)
[facility]  Physician Discharge Summary   Patient ID: [name] MRN: [id number] DOB/AGE: [birth year] 57 y.o.  Admit date: 12/08/2013 Discharge date: 12/16/2013  PCP: Philis Fendt, MD  DISCHARGE DIAGNOSES:  Principal Problem:   Cavitary lesion of lung Active Problems:   Hepatitis B virus infection   Essential hypertension   Hepatic cirrhosis   COPD (chronic obstructive pulmonary disease)   Cavitary pneumonia   RECOMMENDATIONS FOR OUTPATIENT FOLLOW UP: 1. Needs a repeat CT scan of the chest in 4 weeks. 2. Has been made an appointment with pulmonology.  DISCHARGE CONDITION: fair  Diet recommendation: Low sodium  Filed Weights   12/09/13 0912  Weight: 92 kg (202 lb 13.2 oz)    INITIAL HISTORY: 58 year old Caucasian female presented to the hospital with feelings of chest congestion and cough with grayish expectoration. She was admitted back in October with pneumonia. Chest x-ray was obtained which revealed a right upper lobe cavitary lesion. CT scan confirmed this finding, and it was felt that this was infectious in process. She was subsequently admitted to the hospital.  Consultants: Pulmonology  Procedures:  2D ECHO 11/18 Study Conclusions - Left ventricle: The cavity size was normal. Systolic function was normal. The estimated ejection fraction was in the range of 60%to 65%. Wall motion was normal; there were no regional wallmotion abnormalities. Doppler parameters are consistent withabnormal left ventricular relaxation (grade 1 diastolic dysfunction). - Left atrium: The atrium was moderately dilated. - Right atrium: The atrium was mildly dilated. - Atrial septum: No defect or patent foramen ovale was identified. - Pulmonary arteries: PA peak pressure: 32 mm Hg (S). - Pericardium, extracardiac: A small pericardial effusion was identified posterior to the heart.  Antibiotics:  Clindamycin/Vancomycin/Aztreonam till 11/23  Levaquin  11/23   HOSPITAL COURSE:   Cavitary Lesion on RUL Patient was started on clindamycin, vancomycin, and Aztreonam. Pulmonology was consulted. Since this finding was not present on the CT scan from September this was thought to be most likely infectious. Symptomatically, patient started improving. It was not felt that she needed bronchoscopy. Pulmonology recommends three-week course of Levaquin and then repeat imaging study after that. They will follow her in their office. Due to the cavitary nature of this lesion Tuberculosis was a concern. This was ruled out by serial AFBs. Quantiferon Gold was also negative. Cultures have been negative so far. Echocardiogram did not show any source. HIV quantitative RNA was undetectable. She did have some pain in the right upper back. This was thought to be secondary to this cavitary lesion. She was prescribed pain medications.  Essential HTN Blood pressure has been stable. She will resume home medications.    History of Pulmonary Embolism Diagnosed in September. Continue Rivaroxaban.  History of COPD Continue current inhalers. Patient is not wheezing at this moment. She stopped smoking after previous hospitalization.   Acute Kidney Injury Appears to be due to dehydration. Improved with IVF's.   History of Depression Continue citalopram  Dysphagia Patient with history of GERD and esophageal dilatation in past. This can be pursued as an outpatient. There was no urgent need to intervene during this hospitalization, especially considering her acute illness. She was seen by swallow therapist and she was able to swallow solids. Patient was told to follow-up with her gastroenterologist.  History of Liver Cirrhosis  Follows with transplant Hepatology at [facility]. She may resume her medications  History of Hepatitis B  Continue with antiretrovirals.   Overall she is improved. She is stable for discharge.  PERTINENT LABS:  The results of significant  diagnostics from this hospitalization (including imaging, microbiology, ancillary and laboratory) are listed below for reference.    Microbiology: Recent Results (from the past 240 hour(s))  MRSA PCR Screening     Status: Abnormal   Collection Time: 12/09/13  7:50 AM  Result Value Ref Range Status   MRSA by PCR POSITIVE (A) NEGATIVE Final    Comment:        The GeneXpert MRSA Assay (FDA approved for NASAL specimens only), is one component of a comprehensive MRSA colonization surveillance program. It is not intended to diagnose MRSA infection nor to guide or monitor treatment for MRSA infections. RESULT CALLED TO, READ BACK BY AND VERIFIED WITH: MACABUAG,E @ 1025 ON [id number] BY POTEAT,S   AFB culture with smear     Status: None (Preliminary result)   Collection Time: 12/09/13 11:12 PM  Result Value Ref Range Status   Specimen Description SPUTUM  Final   Special Requests NONE  Final   Acid Fast Smear   Final    NO ACID FAST BACILLI SEEN Performed at [facility]    Culture   Final    CULTURE WILL BE EXAMINED FOR 6 WEEKS BEFORE ISSUING A FINAL REPORT Performed at [facility]    Report Status PENDING  Incomplete  AFB culture with smear     Status: None (Preliminary result)   Collection Time: 12/10/13 10:20 PM  Result Value Ref Range Status   Specimen Description SPUTUM  Final   Special Requests NONE  Final   Acid Fast Smear   Final    NO ACID FAST BACILLI SEEN Performed at [facility]    Culture   Final    CULTURE WILL BE EXAMINED FOR 6 WEEKS BEFORE ISSUING A FINAL REPORT Performed at [facility]    Report Status PENDING  Incomplete  Culture, expectorated sputum-assessment     Status: None   Collection Time: 12/11/13  9:56 AM  Result Value Ref Range Status   Specimen Description SPUTUM  Final   Special Requests NONE  Final   Sputum evaluation   Final    THIS SPECIMEN IS ACCEPTABLE. RESPIRATORY CULTURE REPORT TO FOLLOW.   Report  Status 12/11/2013 FINAL  Final  Culture, respiratory (NON-Expectorated)     Status: None   Collection Time: 12/11/13  9:56 AM  Result Value Ref Range Status   Specimen Description SPUTUM  Final   Special Requests NONE  Final   Gram Stain   Final    NO WBC SEEN NO SQUAMOUS EPITHELIAL CELLS SEEN RARE GRAM POSITIVE COCCI IN PAIRS Performed at [facility]    Culture   Final    NORMAL OROPHARYNGEAL FLORA Performed at [facility]    Report Status 12/13/2013 FINAL  Final  AFB culture with smear     Status: None (Preliminary result)   Collection Time: 12/12/13  4:30 AM  Result Value Ref Range Status   Specimen Description SPUTUM  Final   Special Requests NONE  Final   Acid Fast Smear   Final    NO ACID FAST BACILLI SEEN Performed at [facility]    Culture   Final    CULTURE WILL BE EXAMINED FOR 6 WEEKS BEFORE ISSUING A FINAL REPORT Performed at [facility]    Report Status PENDING  Incomplete     Labs: Basic Metabolic Panel:  Recent Labs Lab 12/11/13 0525 12/12/13 0500 12/13/13 0500 12/15/13  0439  NA 135* 136* 134* 141  K 4.6 4.3 4.2 5.0  CL 102 100 100 105  CO2 24 25 27 28   GLUCOSE 83 93 87 110*  BUN 14 14 14 16   CREATININE 1.36* 1.21* 1.05 1.05  CALCIUM 9.5 9.5 9.6 9.8   CBC:  Recent Labs Lab 12/11/13 0525 12/12/13 0500 12/15/13 0439  WBC 3.3* 5.0 4.0  HGB 10.7* 10.3* 11.0*  HCT 33.3* 31.6* 34.1*  MCV 92.5 91.1 92.7  PLT 130* 126* 126*   BNP: BNP (last 3 results)  Recent Labs  12/21/12 1630 07/14/13 2120 12/08/13 1705  PROBNP 149.3* 40.7 145.8*   CBG:  Recent Labs Lab 12/12/13 0748 12/13/13 0739 12/14/13 0743 12/15/13 0746 12/16/13 0744  GLUCAP 116* 89 80 86 105*     IMAGING STUDIES Dg Chest 2 View  12/15/2013   CLINICAL DATA:  58 year old female with recent diagnosis of right upper lobe pneumonia and persistent right upper chest pain. Persistent right chest pain. Feeling improved compared to last  week.  EXAM: CHEST  2 VIEW  COMPARISON:  Most recent prior chest x-ray 12/12/2013; most recent prior chest CT 12/08/2013  FINDINGS: Cardiac and mediastinal contours remain within normal limits. There has been no significant interval change in the rounded opacity in the right upper lung over the 3 days since the prior chest x-ray. The opacity measures approximately 3.3 x 4.0 cm. Persistent trace linear pleural-parenchymal scarring in the inferior lingula. The remainder the lungs remain clear. No acute osseous abnormality. No pleural effusion or pneumothorax.  IMPRESSION: 1. No significant interval change in the rounded right upper lobe opacity in the 3 days since the most recent prior chest x-ray. Recommend continued followup to resolution. Consider repeat chest x-ray in 4 weeks.   Electronically Signed   By: Jacqulynn Cadet M.D.   On: 12/15/2013 08:30   Dg Chest 2 View  12/12/2013   CLINICAL DATA:  Cavitary pneumonia. History of hypertension and COPD.  EXAM: CHEST  2 VIEW  COMPARISON:  Chest CT and chest radiograph, 12/09/2018  FINDINGS: Right upper lobe consolidation is without change from the prior studies. There are no new areas of lung consolidation. No pleural effusion. No pneumothorax.  Cardiac silhouette is normal in size. Normal mediastinal and hilar contours.  Bony thorax is intact.  IMPRESSION: Stable area of right upper lobe consolidation. No new abnormalities.   Electronically Signed   By: Lajean Manes M.D.   On: 12/12/2013 10:21   Dg Chest 2 View  12/08/2013   CLINICAL DATA:  Mid chest pain for 1 day, shortness of breath, COPD, former smoker, hypertension, cirrhosis, GERD, history pulmonary embolism  EXAM: CHEST  2 VIEW  COMPARISON:  10/29/2013  FINDINGS: Normal heart size, mediastinal contours, and pulmonary vascularity.  Persistent RIGHT upper lobe infiltrate/opacity wish question developing area central lucency/cavitation.  Bibasilar subsegmental atelectasis greater on LEFT.  Remaining  lungs clear.  No pleural effusion or pneumothorax.  Scattered endplate spur formation thoracic spine.  IMPRESSION: Bibasilar atelectasis greater on LEFT.  Persistent RIGHT upper lobe opacity with questionable area of developing central lucency, cannot exclude cavitary pneumonia ; CT chest with contrast recommended to evaluate.   Electronically Signed   By: Lavonia Dana M.D.   On: 12/08/2013 17:35   Ct Angio Chest Pe W/cm &/or Wo Cm  12/08/2013   CLINICAL DATA:  Initial evaluation for acute chest pain, shortness of breath.  EXAM: CT ANGIOGRAPHY CHEST WITH CONTRAST  TECHNIQUE: Multidetector CT imaging of the  chest was performed using the standard protocol during bolus administration of intravenous contrast. Multiplanar CT image reconstructions and MIPs were obtained to evaluate the vascular anatomy.  CONTRAST:  30mL OMNIPAQUE IOHEXOL 350 MG/ML SOLN  COMPARISON:  Prior radiograph from earlier the same day, as well as prior CT from 10/08/2013.  FINDINGS: Thyroid gland within normal limits.  Right hilar lymph node measures 1.2 cm in short axis. No other pathologically enlarged mediastinal, hilar, or axillary lymph nodes identified.  Intrathoracic aorta is of normal caliber and appearance. Great vessels within normal limits.  Heart size within normal limits. Small amount of pericardial fluid present.  Pulmonary arterial tree is well opacified. No filling defect to suggest acute pulmonary embolism identified. Re-formatted imaging confirms these findings. Main pulmonary artery itself is somewhat dilated up to 3.3 cm.  There is dense somewhat ill-defined opacity within the right upper lobe measuring 3.9 x 4.0 x 4.0 cm (series 12, image 16). There are a few scattered foci of cavitation within this opacity. This is new relative to prior CT. Given the relative large size of this opacity, possible cavitary pneumonia is favored.  Scattered tree-in-bud nodular opacities present more inferiorly within the posterior segment of  the right lower lobe, also worrisome for infection (series 12, image 39). Similar nodular tree-in-bud opacity seen within the superior segment of the right lower lobe (series 12, image 40). Patchy bibasilar opacities seen more inferiorly within the lower lobes bilaterally.  No pulmonary edema or pleural effusion.  Nodular contour of the partially visualized liver is suggestive of possible cirrhosis. Visualized upper abdomen is otherwise unremarkable.  No acute osseous abnormality. No worrisome lytic or blastic osseous lesions.  Review of the MIP images confirms the above findings.  IMPRESSION: 1. No CT evidence for acute pulmonary embolism. 2. Dense right upper lobe opacity with central lucency as above, worrisome for cavitary pneumonia. Additional tree-in-bud and patchy opacities within the right upper and bilateral lower lobes also concerning for infection. While an underlying mass lesion is not entirely excluded within the right upper lobe, this is less favored given the relative rapidity of onset as compared to prior CT from 10/08/2013 as well as the presence of infection on that prior study. Followup to resolution is recommended. 3. 1.2 cm right hilar lymph node, likely reactive in nature. 4. Nodular contour of the liver, suggesting cirrhosis. Finding is stable from prior   Electronically Signed   By: Jeannine Boga M.D.   On: 12/08/2013 20:44    DISCHARGE EXAMINATION: Filed Vitals:   12/15/13 2025 12/15/13 2109 12/16/13 0608 12/16/13 1057  BP:  111/72 125/74   Pulse:  82 58   Temp:  98.3 F (36.8 C) 97.4 F (36.3 C)   TempSrc:  Oral Oral   Resp:  16 16   Height:      Weight:      SpO2: 95% 97% 99% 98%   General appearance: alert, cooperative, appears stated age and no distress Resp: Few crackles at the right upper lung. No wheezing. Cardio: regular rate and rhythm, S1, S2 normal, no murmur, click, rub or gallop GI: soft, non-tender; bowel sounds normal; no masses,  no  organomegaly  DISPOSITION: Home  Discharge Instructions    Call MD for:  difficulty breathing, headache or visual disturbances    Complete by:  As directed      Call MD for:  persistant dizziness or light-headedness    Complete by:  As directed      Call MD for:  persistant nausea and vomiting    Complete by:  As directed      Call MD for:  temperature >100.4    Complete by:  As directed      Diet - low sodium heart healthy    Complete by:  As directed      Discharge instructions    Complete by:  As directed   You will need a repeat CT scan of your chest in 4 weeks. You have been made an appointment with the lung doctor. Please keep the appointment.     Increase activity slowly    Complete by:  As directed            ALLERGIES:  Allergies  Allergen Reactions  . Elavil [Amitriptyline] Other (See Comments)    Causes her to be very "disoriented".  . Heparin Other (See Comments)    HIT ab positive with elevated OD  . Duloxetine Other (See Comments)    REACTION: Headache  . Gabapentin Other (See Comments)    REACTION: rash in mouth  . Neomycin-Bacitracin Zn-Polymyx Other (See Comments)    REACTION: rash/hives    Discharge Medication List as of 12/16/2013 11:36 AM    START taking these medications   Details  oxyCODONE (OXY IR/ROXICODONE) 5 MG immediate release tablet Take 1 tablet (5 mg total) by mouth every 4 (four) hours as needed for moderate pain., Starting 12/16/2013, Until Discontinued, Print      CONTINUE these medications which have CHANGED   Details  levofloxacin (LEVAQUIN) 500 MG tablet Take 1 tablet (500 mg total) by mouth daily. For 3 weeks., Starting 12/16/2013, Until Discontinued, Print    spironolactone (ALDACTONE) 50 MG tablet Take 2 tablets (100 mg total) by mouth daily., Starting 12/16/2013, Until Discontinued, Print      CONTINUE these medications which have NOT CHANGED   Details  acetaminophen (TYLENOL) 325 MG tablet Take 325 mg by mouth every 6  (six) hours as needed for moderate pain (back & chest pain)., Until Discontinued, Historical Med    albuterol (PROVENTIL HFA;VENTOLIN HFA) 108 (90 BASE) MCG/ACT inhaler Inhale 2 puffs into the lungs every 6 (six) hours as needed for wheezing or shortness of breath (shortness of breath & wheezing). For shortness of breath, Until Discontinued, Historical Med    ALPRAZolam (XANAX) 1 MG tablet Take 1 tablet (1 mg total) by mouth 3 (three) times daily as needed. For anxiety, Starting 11/02/2013, Until Discontinued, Print    budesonide-formoterol (SYMBICORT) 160-4.5 MCG/ACT inhaler Inhale 2 puffs into the lungs 2 (two) times daily.  , Until Discontinued, Historical Med    citalopram (CELEXA) 20 MG tablet Take 20 mg by mouth at bedtime., Until Discontinued, Historical Med    entecavir (BARACLUDE) 0.5 MG tablet Take 0.5 mg by mouth every other day. , Until Discontinued, Historical Med    furosemide (LASIX) 20 MG tablet Take 1 tablet (20 mg total) by mouth daily., Starting 11/02/2013, Until Discontinued, Print    Iloperidone (FANAPT) 6 MG TABS Take 12 mg by mouth daily. , Until Discontinued, Historical Med    nicotine (NICODERM CQ - DOSED IN MG/24 HR) 7 mg/24hr patch Place 1 patch (7 mg total) onto the skin daily., Starting 10/14/2013, Until Discontinued, Print    omeprazole (PRILOSEC) 40 MG capsule Take 80 mg by mouth daily. , Until Discontinued, Historical Med    polyethylene glycol (MIRALAX / GLYCOLAX) packet Take 17 g by mouth 2 (two) times daily., Starting 10/14/2013, Until Discontinued, Print    rivaroxaban (  XARELTO) 20 MG TABS tablet Take 1 tablet (20 mg total) by mouth daily with supper., Starting 11/02/2013, Until Discontinued, Print    senna (SENOKOT) 8.6 MG TABS tablet Take 3 tablets (25.8 mg total) by mouth at bedtime., Starting 10/14/2013, Until Discontinued, Print    tenofovir (VIREAD) 300 MG tablet Take 300 mg by mouth every other day., Until Discontinued, Historical Med    tiotropium  (SPIRIVA) 18 MCG inhalation capsule Place 18 mcg into inhaler and inhale daily.  , Until Discontinued, Historical Med    traMADol (ULTRAM) 50 MG tablet Take 1 tablet (50 mg total) by mouth 2 (two) times daily., Starting 11/02/2013, Until Discontinued, Print      STOP taking these medications     amitriptyline (ELAVIL) 50 MG tablet      ciprofloxacin (CIPRO) 750 MG tablet        Follow-up Information    Follow up with PARRETT,TAMMY, NP.   Specialty:  Nurse Practitioner   Why:  Appointment on 12/30/13 at [facility] for pneumonia   Contact information:   [address] [address] [phone]       Follow up with Nolene Ebbs A, MD. Schedule an appointment as soon as possible for a visit in 1 week.   Specialty:  Internal Medicine   Why:  post hospitalization follow up   Contact information:   [place] [address] [phone]       TOTAL DISCHARGE TIME: 35 minutes.  [facility]  [facility] Pager [phone]  12/16/2013, 3:53 PM

## 2013-12-16 NOTE — Care Management Note (Signed)
CARE MANAGEMENT NOTE 12/16/2013  Patient:  [name], [name]   Account Number:  [id number]  Date Initiated:  12/10/2013  Documentation initiated by:  Marney Doctor  Subjective/Objective Assessment:   58 yo admitted with Cavitary lesion of lung. Hx of PMH of HTN, COPD, Hepatitis B/C+ with cirrhosis, PE/DVT (09/2103), GERD, Anxiety / Depression, Bipolar Disorder, & Former Cocaine abuse     Action/Plan:   from home with children   Anticipated DC Date:  12/16/2013   Anticipated DC Plan:  [place]  CM consult      Choice offered to / List presented to:             Status of service:  In process, will continue to follow Medicare Important Message given?  YES (If response is "NO", the following Medicare IM given date fields will be blank) Date Medicare IM given:  12/16/2013 Medicare IM given by:  Marney Doctor Date Additional Medicare IM given:   Additional Medicare IM given by:    Discharge Disposition:    Per UR Regulation:  Reviewed for med. necessity/level of care/duration of stay  If discussed at [place] of Stay Meetings, dates discussed:   12/16/2013    Comments:  12/10/13 Marney Doctor RN,BSN,NCM [phone] Chart reviewed and CM following for DC needs.

## 2013-12-16 NOTE — Discharge Instructions (Signed)

## 2013-12-17 IMAGING — CR DG CHEST 2V
2 series · 2 of 2 positions shown · non-contrast
Comparison: 04/16/2011.

CLINICAL DATA: Short of breath.  Chest pain.  Headache.

CHEST - 2 VIEW

[w chest pa *]
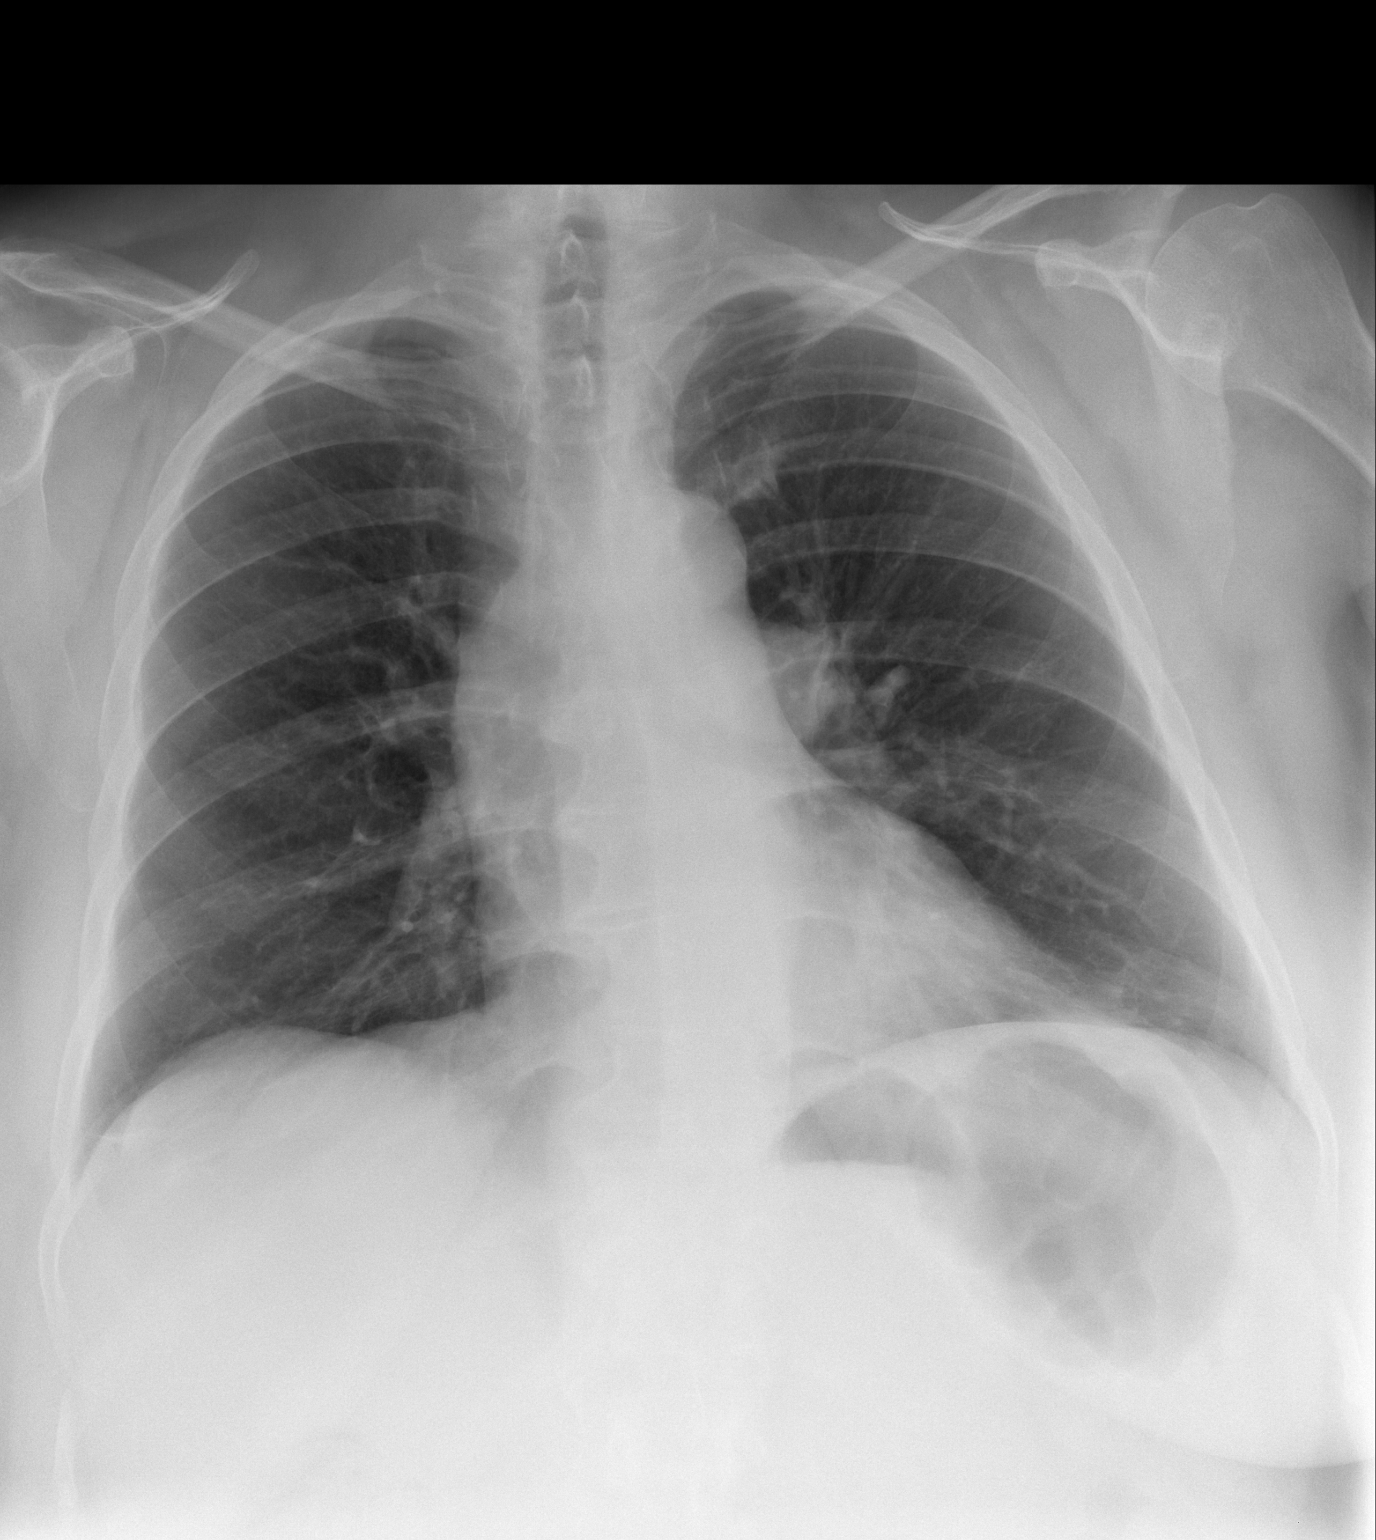

[w chest lat]
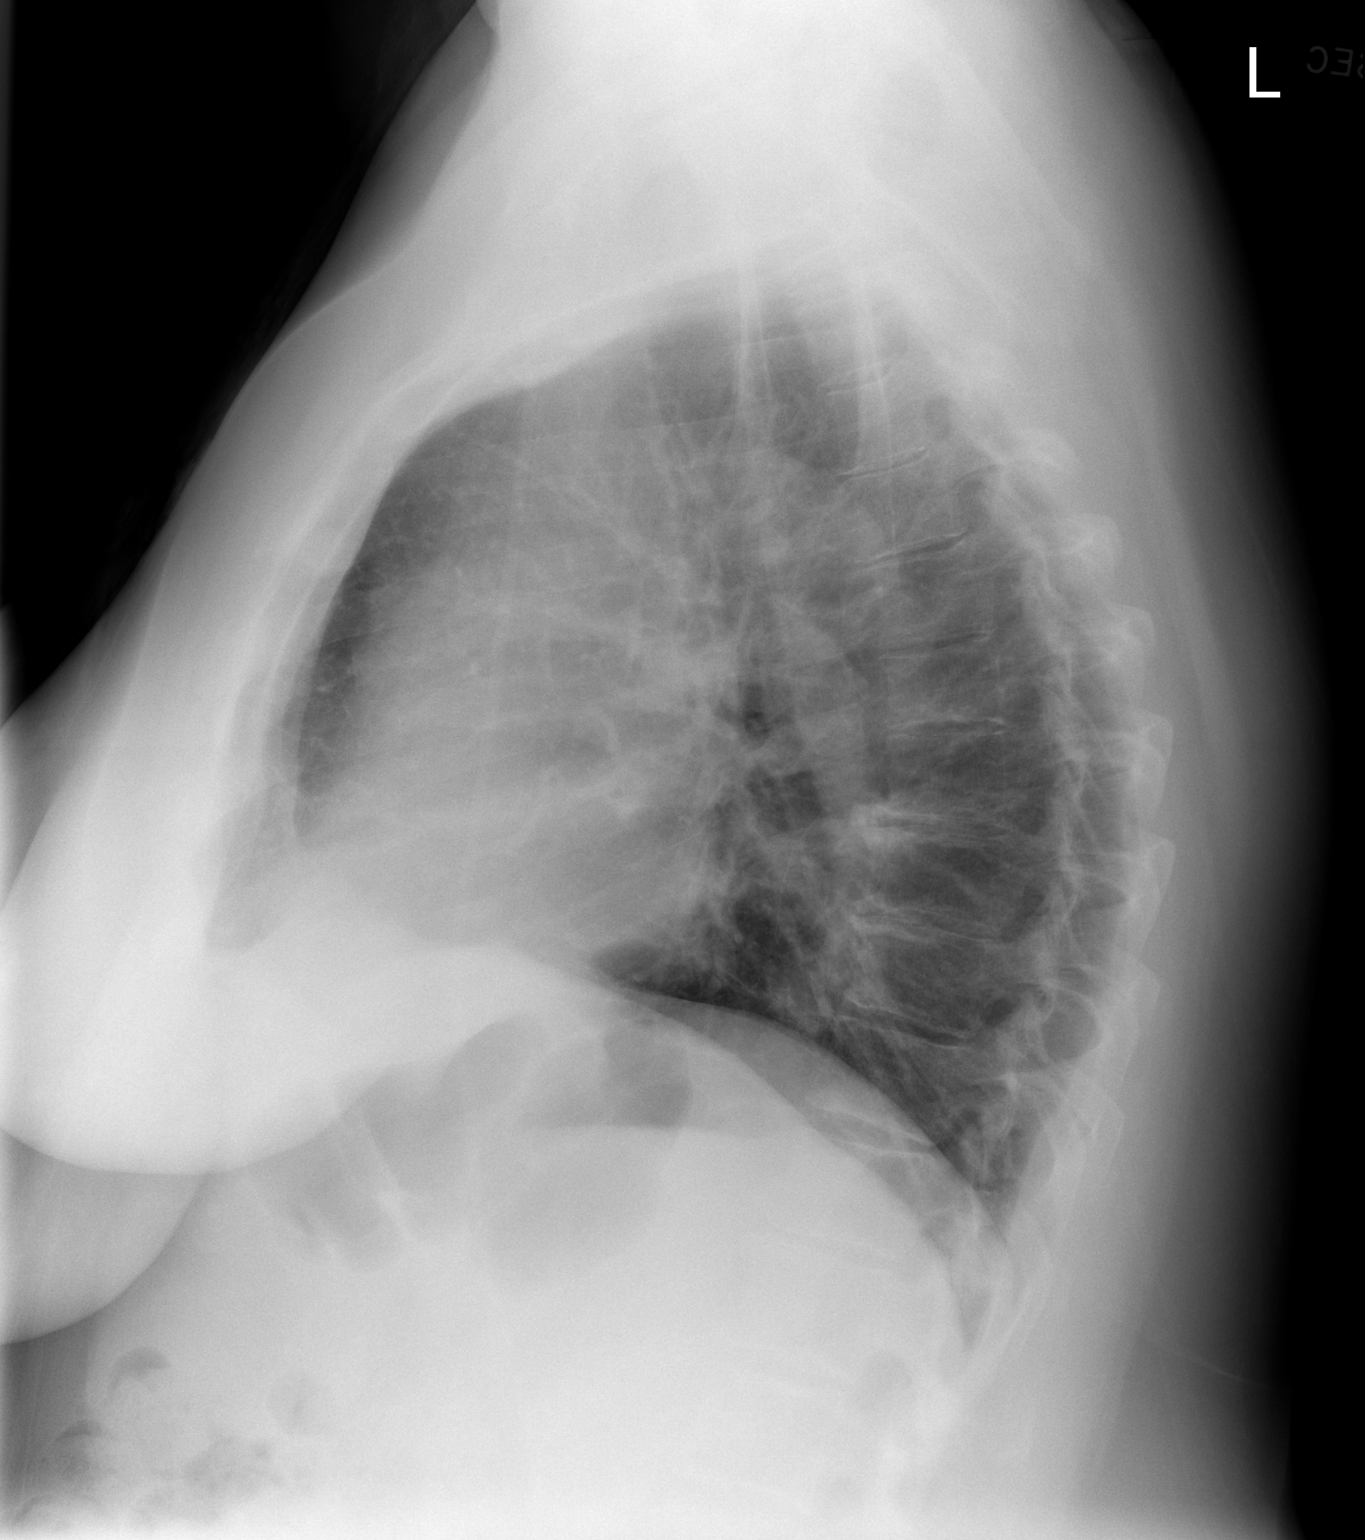

[2 of 2 positions shown; findings below may reference images not displayed]

FINDINGS: Patchy left basilar density is present projecting over
the left atrium on the lateral view.  This is compatible with left
lower lobe airspace disease or atelectasis.  Subsegmental
atelectasis is present at the right lung base.  Cardiopericardial
silhouette and mediastinal contours appear within normal limits.
Tubing projects over the chest on the lateral view.  This is not
seen on the frontal view.  This is probably external to the
patient.
IMPRESSION: Patchy left lower lobe density suspicious for pneumonia.
Atelectasis could produce a similar appearance.

## 2013-12-30 ENCOUNTER — Other Ambulatory Visit (INDEPENDENT_AMBULATORY_CARE_PROVIDER_SITE_OTHER): Payer: Medicare Other

## 2013-12-30 ENCOUNTER — Encounter: Payer: Self-pay | Admitting: Adult Health

## 2013-12-30 ENCOUNTER — Ambulatory Visit (INDEPENDENT_AMBULATORY_CARE_PROVIDER_SITE_OTHER)
Admission: RE | Admit: 2013-12-30 | Discharge: 2013-12-30 | Disposition: A | Discharge status: Home / Self Care | Payer: Medicare Other | Source: Ambulatory Visit | Attending: Adult Health | Admitting: Adult Health

## 2013-12-30 ENCOUNTER — Ambulatory Visit (INDEPENDENT_AMBULATORY_CARE_PROVIDER_SITE_OTHER): Payer: Medicare Other | Admitting: Adult Health

## 2013-12-30 VITALS — BP 110/78 | HR 89 | Temp 97.7°F | Ht 64.0 in | Wt 199.2 lb

## 2013-12-30 DIAGNOSIS — J189 Pneumonia, unspecified organism: Secondary | ICD-10-CM

## 2013-12-30 DIAGNOSIS — J984 Other disorders of lung: Secondary | ICD-10-CM

## 2013-12-30 DIAGNOSIS — J438 Other emphysema: Secondary | ICD-10-CM

## 2013-12-30 DIAGNOSIS — I2699 Other pulmonary embolism without acute cor pulmonale: Secondary | ICD-10-CM

## 2013-12-30 LAB — CBC WITH DIFFERENTIAL/PLATELET
BASOS PCT: 0.2 % (ref 0.0–3.0)
Basophils Absolute: 0 10*3/uL (ref 0.0–0.1)
EOS ABS: 0 10*3/uL (ref 0.0–0.7)
Eosinophils Relative: 0.7 % (ref 0.0–5.0)
HCT: 42.1 % (ref 36.0–46.0)
Hemoglobin: 13.9 g/dL (ref 12.0–15.0)
LYMPHS ABS: 1.8 10*3/uL (ref 0.7–4.0)
Lymphocytes Relative: 32.6 % (ref 12.0–46.0)
MCHC: 33 g/dL (ref 30.0–36.0)
MCV: 91.8 fl (ref 78.0–100.0)
MONO ABS: 0.3 10*3/uL (ref 0.1–1.0)
Monocytes Relative: 5.9 % (ref 3.0–12.0)
NEUTROS PCT: 60.6 % (ref 43.0–77.0)
Neutro Abs: 3.3 10*3/uL (ref 1.4–7.7)
Platelets: 115 10*3/uL — ABNORMAL LOW (ref 150.0–400.0)
RBC: 4.58 Mil/uL (ref 3.87–5.11)
RDW: 15.5 % (ref 11.5–15.5)
WBC: 5.4 10*3/uL (ref 4.0–10.5)

## 2013-12-30 LAB — BASIC METABOLIC PANEL
BUN: 26 mg/dL — ABNORMAL HIGH (ref 6–23)
CO2: 27 meq/L (ref 19–32)
CREATININE: 1.7 mg/dL — AB (ref 0.4–1.2)
Calcium: 9.9 mg/dL (ref 8.4–10.5)
Chloride: 103 mEq/L (ref 96–112)
GFR: 32.7 mL/min — ABNORMAL LOW (ref >60.00)
Glucose, Bld: 104 mg/dL — ABNORMAL HIGH (ref 70–99)
Potassium: 4.2 mEq/L (ref 3.5–5.1)
Sodium: 138 mEq/L (ref 135–145)

## 2013-12-30 NOTE — Assessment & Plan Note (Signed)
Continue on current regimen  follow up PFT in 1 month

## 2013-12-30 NOTE — Assessment & Plan Note (Signed)
RUL PNA -cavitary  Improving w/ prolonged abx   Plan  Finish Levaquin  Repeat CT in 3-4 weeks with OV

## 2013-12-30 NOTE — Progress Notes (Signed)
   Subjective:    Patient ID: [name], female    DOB: [birth year], 59 y.o.   MRN: [id number]  HPI 58 yo former smoker and cocaine use with etoh cirrhosis seen by PCCM during recurrent hospitalizations for PNA and RUL cavitary lesion She was dx w/ PE in 09/2013, started on Xarelto.  She has had a complicated last 3 months with admission for PE in Sept then in Oct admitted with septic shock, pseudomonal bacteremia and then readmitted  Nov with cavitary PNA in RUL   She underwent CT Chest >> no PE, dense RUL opacity, worrisome for cavitary PNA, tree-in-bud & patchy opacities RUL, B LL. New from prior CT in 10/08/13 that was positive for PE. Workup showed Quantiferon Gold >> negative  Echo >> EF 60 to 07%, grade 1 diastolic dysfx, mod LA dilation, PAS 32 mmHg Sputum for AFB has been neg.  She was started on aggressive abx and discharged this last admission on 3 weeks of Levaquin.  Cough and congestion are some better but she says she remains very weak. Does not feel good.  She remains on Levaquin.      Review of Systems Constitutional:   No  weight loss, night sweats,  Fevers, chills,  +fatigue, or  lassitude.  HEENT:   No headaches,  Difficulty swallowing,  Tooth/dental problems, or  Sore throat,                No sneezing, itching, ear ache, nasal congestion, post nasal drip,   CV:  No chest pain,  Orthopnea, PND, swelling in lower extremities, anasarca, dizziness, palpitations, syncope.   GI  No heartburn, indigestion, abdominal pain, nausea, vomiting, diarrhea, change in bowel habits, loss of appetite, bloody stools.   Resp:  .  No chest wall deformity  Skin: no rash or lesions.  GU: no dysuria, change in color of urine, no urgency or frequency.  No flank pain, no hematuria   MS:  No joint pain or swelling.  No decreased range of motion.  No back pain.  Psych:  No change in mood or affect. No depression or anxiety.  No memory loss.         Objective:   Physical  Exam GEN: A/Ox3; pleasant , NAD, obese   HEENT:  [place]/AT,  EACs-clear, TMs-wnl, NOSE-clear, THROAT-clear, no lesions, no postnasal drip or exudate noted.   NECK:  Supple w/ fair ROM; no JVD; normal carotid impulses w/o bruits; no thyromegaly or nodules palpated; no lymphadenopathy.  RESP  Few scant rhonchi no accessory muscle use, no dullness to percussion  CARD:  RRR, no m/r/g  , no peripheral edema, pulses intact, no cyanosis or clubbing.  GI:   Soft & nt; nml bowel sounds; no organomegaly or masses detected.  Musco: Warm bil, no deformities or joint swelling noted.   Neuro: alert, no focal deficits noted.    Skin: Warm, no lesions or rashes     CXR 12/30/2013 shows Resolved right upper lobe, apical, cavitary pneumonia. Residual thin-walled cavity is evident. 2. No acute cardiopulmonary disease. No new abnormalities.    Assessment & Plan:

## 2013-12-30 NOTE — Assessment & Plan Note (Signed)
Continue on Xarelto  Check labs today

## 2013-12-30 NOTE — Patient Instructions (Signed)
Finish Levaquin as directed Labs today .  Continue on Symbicort and Spiriva .  We will set you up for a CT chest in 4 weeks  Follow up Dr. Elsworth Soho  In 5 weeks with PFT and to review CT results.  Please contact office for sooner follow up if symptoms do not improve or worsen or seek emergency care

## 2013-12-31 NOTE — Progress Notes (Signed)
Reviewed & agree with plan  

## 2014-01-21 LAB — AFB CULTURE WITH SMEAR (NOT AT ARMC): Acid Fast Smear: NONE SEEN

## 2014-01-22 LAB — AFB CULTURE WITH SMEAR (NOT AT ARMC): Acid Fast Smear: NONE SEEN

## 2014-01-25 LAB — AFB CULTURE WITH SMEAR (NOT AT ARMC): Acid Fast Smear: NONE SEEN

## 2014-01-28 ENCOUNTER — Ambulatory Visit (INDEPENDENT_AMBULATORY_CARE_PROVIDER_SITE_OTHER)
Admission: RE | Admit: 2014-01-28 | Discharge: 2014-01-28 | Disposition: A | Discharge status: Home / Self Care | Payer: Medicare Other | Source: Ambulatory Visit | Attending: Pulmonary Disease | Admitting: Pulmonary Disease

## 2014-01-28 DIAGNOSIS — J984 Other disorders of lung: Secondary | ICD-10-CM

## 2014-01-28 DIAGNOSIS — J189 Pneumonia, unspecified organism: Secondary | ICD-10-CM

## 2014-02-05 ENCOUNTER — Ambulatory Visit: Payer: Self-pay | Admitting: Pulmonary Disease

## 2014-02-07 ENCOUNTER — Emergency Department (HOSPITAL_COMMUNITY)
Admission: EM | Admit: 2014-02-07 | Discharge: 2014-02-07 | Disposition: A | Discharge status: Home / Self Care | Payer: Medicare Other | Attending: Emergency Medicine | Admitting: Emergency Medicine

## 2014-02-07 ENCOUNTER — Encounter (HOSPITAL_COMMUNITY): Payer: Self-pay

## 2014-02-07 ENCOUNTER — Emergency Department (HOSPITAL_COMMUNITY): Payer: Medicare Other

## 2014-02-07 DIAGNOSIS — Z8701 Personal history of pneumonia (recurrent): Secondary | ICD-10-CM | POA: Insufficient documentation

## 2014-02-07 DIAGNOSIS — I10 Essential (primary) hypertension: Secondary | ICD-10-CM | POA: Diagnosis not present

## 2014-02-07 DIAGNOSIS — K219 Gastro-esophageal reflux disease without esophagitis: Secondary | ICD-10-CM | POA: Diagnosis not present

## 2014-02-07 DIAGNOSIS — M1990 Unspecified osteoarthritis, unspecified site: Secondary | ICD-10-CM | POA: Diagnosis not present

## 2014-02-07 DIAGNOSIS — Z86718 Personal history of other venous thrombosis and embolism: Secondary | ICD-10-CM | POA: Diagnosis not present

## 2014-02-07 DIAGNOSIS — Z87442 Personal history of urinary calculi: Secondary | ICD-10-CM | POA: Insufficient documentation

## 2014-02-07 DIAGNOSIS — R51 Headache: Secondary | ICD-10-CM | POA: Diagnosis present

## 2014-02-07 DIAGNOSIS — Z792 Long term (current) use of antibiotics: Secondary | ICD-10-CM | POA: Insufficient documentation

## 2014-02-07 DIAGNOSIS — Z86711 Personal history of pulmonary embolism: Secondary | ICD-10-CM | POA: Diagnosis not present

## 2014-02-07 DIAGNOSIS — Z7951 Long term (current) use of inhaled steroids: Secondary | ICD-10-CM | POA: Insufficient documentation

## 2014-02-07 DIAGNOSIS — G4489 Other headache syndrome: Secondary | ICD-10-CM | POA: Insufficient documentation

## 2014-02-07 DIAGNOSIS — J449 Chronic obstructive pulmonary disease, unspecified: Secondary | ICD-10-CM | POA: Diagnosis not present

## 2014-02-07 DIAGNOSIS — Z79899 Other long term (current) drug therapy: Secondary | ICD-10-CM | POA: Diagnosis not present

## 2014-02-07 DIAGNOSIS — F319 Bipolar disorder, unspecified: Secondary | ICD-10-CM | POA: Diagnosis not present

## 2014-02-07 DIAGNOSIS — R05 Cough: Secondary | ICD-10-CM | POA: Diagnosis not present

## 2014-02-07 DIAGNOSIS — F419 Anxiety disorder, unspecified: Secondary | ICD-10-CM | POA: Diagnosis not present

## 2014-02-07 DIAGNOSIS — Z87448 Personal history of other diseases of urinary system: Secondary | ICD-10-CM | POA: Insufficient documentation

## 2014-02-07 DIAGNOSIS — Z87891 Personal history of nicotine dependence: Secondary | ICD-10-CM | POA: Insufficient documentation

## 2014-02-07 DIAGNOSIS — Z8619 Personal history of other infectious and parasitic diseases: Secondary | ICD-10-CM | POA: Insufficient documentation

## 2014-02-07 DIAGNOSIS — R519 Headache, unspecified: Secondary | ICD-10-CM

## 2014-02-07 DIAGNOSIS — R059 Cough, unspecified: Secondary | ICD-10-CM

## 2014-02-07 LAB — I-STAT CHEM 8, ED
BUN: 15 mg/dL (ref 6–23)
Calcium, Ion: 1.28 mmol/L — ABNORMAL HIGH (ref 1.12–1.23)
Chloride: 101 mEq/L (ref 96–112)
Creatinine, Ser: 1.2 mg/dL — ABNORMAL HIGH (ref 0.50–1.10)
Glucose, Bld: 91 mg/dL (ref 70–99)
HCT: 45 % (ref 36.0–46.0)
Hemoglobin: 15.3 g/dL — ABNORMAL HIGH (ref 12.0–15.0)
Potassium: 3.8 mmol/L (ref 3.5–5.1)
SODIUM: 138 mmol/L (ref 135–145)
TCO2: 23 mmol/L (ref 0–100)

## 2014-02-07 MED ORDER — MORPHINE SULFATE 4 MG/ML IJ SOLN
4.0000 mg | Freq: Once | INTRAMUSCULAR | Status: AC
  Administered 2014-02-07: 4 mg via INTRAVENOUS
  Filled 2014-02-07: qty 1

## 2014-02-07 MED ORDER — METOCLOPRAMIDE HCL 5 MG/ML IJ SOLN
10.0000 mg | Freq: Once | INTRAMUSCULAR | Status: AC
  Administered 2014-02-07: 10 mg via INTRAVENOUS
  Filled 2014-02-07: qty 2

## 2014-02-07 MED ORDER — SODIUM CHLORIDE 0.9 % IV BOLUS (SEPSIS)
1000.0000 mL | Freq: Once | INTRAVENOUS | Status: AC
  Administered 2014-02-07: 1000 mL via INTRAVENOUS

## 2014-02-07 MED ORDER — ONDANSETRON HCL 4 MG/2ML IJ SOLN
4.0000 mg | Freq: Once | INTRAMUSCULAR | Status: AC
  Administered 2014-02-07: 4 mg via INTRAVENOUS
  Filled 2014-02-07: qty 2

## 2014-02-07 NOTE — ED Notes (Signed)
She c/o headache "My head hurts all over".  She also c/o n/v; worse today-vomited x 7 today.  She states she has never had a h/a like this.  She further tells me that within the past year she has been hospitalized; including an ICU stay, for pneumonia.  Her skin is pale, warm and dry and she is breathing normally.  She is alert and oriented x 4 with clear speech and she denies any visual disturbances.

## 2014-02-07 NOTE — ED Provider Notes (Signed)
CSN: [id number]     Arrival date & time 02/07/14  1601 History   First MD Initiated Contact with Patient 02/07/14 1734     Chief Complaint  Patient presents with  . Headache     (Consider location/radiation/quality/duration/timing/severity/associated sxs/prior Treatment) HPI Complains of left-sided parietal headache onset this morning with vomiting 7 times today. She also reports cough nonproductive times one week with mild shortness of breath. No other associated symptoms no treatment prior to coming here. Patient gets headaches approximately once per week. Nothing makes symptoms better or worse. She denies nausea at present. Headache is moderate to severe and nonradiating Past Medical History  Diagnosis Date  . Hypertension   . COPD (chronic obstructive pulmonary disease)   . Cirrhosis   . Pulmonary emboli 09/2013  . Renal insufficiency   . Kidney stones   . Heart murmur   . DVT (deep venous thrombosis) 09/2013    RLE  . Pneumonia     "4 times in the past year" (10/30/2013)  . History of blood transfusion     "due to excessive blood loss before hysterectomy"  . GERD (gastroesophageal reflux disease)   . Hepatitis B   . Arthritis     "knees; lower back" (10/30/2013)  . Anxiety   . Depression   . Bipolar disorder    Past Surgical History  Procedure Laterality Date  . Carpal tunnel release Left   . Fracture surgery    . Ankle fracture surgery Left   . Abdominal hysterectomy    . Tubal ligation    . Dilation and curettage of uterus  X 2   Family History  Problem Relation Age of Onset  . Stroke Mother   . Lung cancer Father    History  Substance Use Topics  . Smoking status: Former Smoker -- 1.00 packs/day for 41 years    Types: Cigarettes    Quit date: 10/02/2013  . Smokeless tobacco: Never Used  . Alcohol Use: Yes     Comment: "stopped drinking  in 2004"   OB History    No data available     Review of Systems  Constitutional: Negative.   Respiratory:  Positive for cough.   Cardiovascular: Negative.   Gastrointestinal: Positive for vomiting.  Musculoskeletal: Negative.   Skin: Negative.   Neurological: Positive for headaches.  Psychiatric/Behavioral: Negative.   All other systems reviewed and are negative.     Allergies  Elavil; Heparin; Duloxetine; Gabapentin; and Neomycin-bacitracin zn-polymyx  Home Medications   Prior to Admission medications   Medication Sig Start Date End Date Taking? Authorizing Provider  acetaminophen (TYLENOL) 325 MG tablet Take 325 mg by mouth every 6 (six) hours as needed for moderate pain (back & chest pain).    Historical Provider, MD  albuterol (PROVENTIL HFA;VENTOLIN HFA) 108 (90 BASE) MCG/ACT inhaler Inhale 2 puffs into the lungs every 6 (six) hours as needed for wheezing or shortness of breath (shortness of breath & wheezing). For shortness of breath    Historical Provider, MD  ALPRAZolam (XANAX) 1 MG tablet Take 1 tablet (1 mg total) by mouth 3 (three) times daily as needed. For anxiety 11/02/13   Jonetta Osgood, MD  budesonide-formoterol (SYMBICORT) 160-4.5 MCG/ACT inhaler Inhale 2 puffs into the lungs 2 (two) times daily.      Historical Provider, MD  citalopram (CELEXA) 20 MG tablet Take 20 mg by mouth at bedtime.    Historical Provider, MD  entecavir (BARACLUDE) 0.5 MG tablet Take 0.5 mg by  mouth every other day.     Historical Provider, MD  Iloperidone (FANAPT) 6 MG TABS Take 12 mg by mouth every morning.     Historical Provider, MD  levofloxacin (LEVAQUIN) 500 MG tablet Take 1 tablet (500 mg total) by mouth daily. For 3 weeks. 12/16/13   Bonnielee Haff, MD  pantoprazole (PROTONIX) 40 MG tablet Take 40 mg by mouth daily. 12/23/13   Historical Provider, MD  rivaroxaban (XARELTO) 20 MG TABS tablet Take 1 tablet (20 mg total) by mouth daily with supper. 11/02/13   Shanker Kristeen Mans, MD  spironolactone (ALDACTONE) 50 MG tablet Take 2 tablets (100 mg total) by mouth daily. 12/16/13   Bonnielee Haff,  MD  tenofovir (VIREAD) 300 MG tablet Take 300 mg by mouth every other day.    Historical Provider, MD  tiotropium (SPIRIVA) 18 MCG inhalation capsule Place 18 mcg into inhaler and inhale daily.      Historical Provider, MD  traMADol (ULTRAM) 50 MG tablet Take 1 tablet (50 mg total) by mouth 2 (two) times daily. 11/02/13   Shanker Kristeen Mans, MD   BP 126/77 mmHg  Pulse 63  Temp(Src) 98.1 F (36.7 C) (Oral)  Resp 16  SpO2 94% Physical Exam  Constitutional: She appears well-developed and well-nourished.  HENT:  Head: Normocephalic and atraumatic.  Eyes: Conjunctivae are normal. Pupils are equal, round, and reactive to light.  Neck: Neck supple. No tracheal deviation present. No thyromegaly present.  Cardiovascular: Normal rate and regular rhythm.   No murmur heard. Pulmonary/Chest: Effort normal. No respiratory distress.  Scant diffuse rhonchi  Abdominal: Soft. Bowel sounds are normal. She exhibits no distension. There is no tenderness.  Musculoskeletal: Normal range of motion. She exhibits no edema or tenderness.  Neurological: She is alert. She displays normal reflexes. No cranial nerve deficit. She exhibits normal muscle tone. Coordination normal.  Gait normal Romberg normal pronator drift normal DTR symmetric bilaterally knee jerk ankle jerk biceps toes to order bilaterally. Cranial nerves II through XII grossly intact  Skin: Skin is warm and dry. No rash noted.  Psychiatric: She has a normal mood and affect.  Nursing note and vitals reviewed.   ED Course  Procedures (including critical care time) Labs Review Labs Reviewed - No data to display  Imaging Review No results found.   EKG Interpretation None       Date: @EDTODAY (<PARAMETER> error)@  Rate: 55  Rhythm: sinus bradycardia  QRS Axis: normal  Intervals: normal  ST/T Wave abnormalities: normal  Conduction Disutrbances:none  Narrative Interpretation:   Old EKG Reviewed: Rate slower than 12/08/2013 otherwise  unchanged interpreted by me  Chest x-ray viewed by me.  7 10 PM pain and nausea improved after treatment with intravenous Reglan however requesting more antiemetics and analgesics. Morphine and Zofran ordered.  8:10 PM patient feels improved and ready to go home after treatment with intravenous fluids and antiemetics and opioids she is alert and ambulates without difficulty. She is no longer nauseated Results for orders placed or performed during the hospital encounter of 02/07/14  I-stat chem 8, ed  Result Value Ref Range   Sodium 138 135 - 145 mmol/L   Potassium 3.8 3.5 - 5.1 mmol/L   Chloride 101 96 - 112 mEq/L   BUN 15 6 - 23 mg/dL   Creatinine, Ser 1.20 (H) 0.50 - 1.10 mg/dL   Glucose, Bld 91 70 - 99 mg/dL   Calcium, Ion 1.28 (H) 1.12 - 1.23 mmol/L   TCO2 23 0 - 100  mmol/L   Hemoglobin 15.3 (H) 12.0 - 15.0 g/dL   HCT 45.0 36.0 - 46.0 %   Dg Chest 2 View  02/07/2014   CLINICAL DATA:  Shortness of breath, congestion, wheezing. Intermittent fever for 5 days. Recent pneumonia.  EXAM: CHEST  2 VIEW  COMPARISON:  Chest CT 01/28/2014  FINDINGS: Cystic area again noted within the right apex as seen on prior CT, likely postinfectious scarring. Scarring or atelectasis at the left base. No acute airspace opacities. No effusions. Heart is normal size. No acute bony abnormality.  IMPRESSION: Left base atelectasis or scarring. Stable cystic change in the right apex. No acute findings.   Electronically Signed   By: Rolm Baptise M.D.   On: 02/07/2014 18:48   Ct Head Wo Contrast  02/07/2014   CLINICAL DATA:  One week history of progressively worsening headache, which the patient states today is the worst headache she has ever had, associated with nausea and vomiting today. Current history of hypertension.  EXAM: CT HEAD WITHOUT CONTRAST  TECHNIQUE: Contiguous axial images were obtained from the base of the skull through the vertex without intravenous contrast.  COMPARISON:  Multiple prior CT head  examinations dating back to 09/23/2005, most recently 10/26/2013.  FINDINGS: Ventricular system normal in size and appearance for age. No mass lesion. No midline shift. No acute hemorrhage or hematoma. No extra-axial fluid collections. No evidence of acute infarction. No focal brain parenchymal abnormalities.  No focal osseous abnormalities involving the skull. Visualized paranasal sinuses, bilateral mastoid air cells, and bilateral middle ear cavities well-aerated.  IMPRESSION: Normal examination.   Electronically Signed   By: Evangeline Dakin M.D.   On: 02/07/2014 18:43   Ct Chest Wo Contrast  01/28/2014   CLINICAL DATA:  Followup cavitary lesion after antibiotic therapy.  EXAM: CT CHEST WITHOUT CONTRAST  TECHNIQUE: Multidetector CT imaging of the chest was performed following the standard protocol without IV contrast.  COMPARISON:  12/08/2013 and 10/08/2013.  FINDINGS: No pathologically enlarged mediastinal or axillary lymph nodes. Hilar regions are difficult to definitively evaluate without IV contrast. Atherosclerotic calcification of the arterial vasculature. Heart size normal. No pericardial effusion.  A cystic cavity is seen in the apical segment right upper lobe, in place of masslike consolidation seen on 12/08/2013. Image quality is somewhat degraded by respiratory motion. Mild scarring in the lower lobes. No pleural fluid. Airway is unremarkable.  Incidental imaging of the upper abdomen shows a markedly irregular liver. Visualized portions of the gallbladder, adrenal glands, spleen, pancreas, stomach and bowel are otherwise grossly unremarkable.  No worrisome lytic or sclerotic lesions.  IMPRESSION: 1. Cystic post infectious scarring in the apical segment right upper lobe. 2. Cirrhosis.   Electronically Signed   By: Lorin Picket M.D.   On: 01/28/2014 15:30    MDM  Plan she can use her pro-air inhaler 2 puffs every 4 hours as needed for cough or shortness of breath. Follow up with Dr.Avbuere for  continued headaches diagnosis #1 left parietal headache #2 nausea and vomiting #3 acute bronchitis #4 mild renal insufficiency Final diagnoses:  None        Orlie Dakin, MD 02/07/14 2017

## 2014-02-07 NOTE — ED Notes (Signed)
Awake. Verbally responsive. A/O x4. Resp even and unlabored. No audible adventitious breath sounds noted. ABC's intact. SR on monitor at 60 bpm.

## 2014-02-07 NOTE — Discharge Instructions (Signed)
Headaches, Frequently Asked Questions The CT scan of your brain was normal tonight. No serious cause of your headache. Usually her pro-air inhaler 2 puffs every 4 hours as needed for cough or shortness of breath. Contact Dr.Avbuere if cough is not better in a week MIGRAINE HEADACHES Q: What is migraine? What causes it? How can I treat it? A: Generally, migraine headaches begin as a dull ache. Then they develop into a constant, throbbing, and pulsating pain. You may experience pain at the temples. You may experience pain at the front or back of one or both sides of the head. The pain is usually accompanied by a combination of:  Nausea.  Vomiting.  Sensitivity to light and noise. Some people (about 15%) experience an aura (see below) before an attack. The cause of migraine is believed to be chemical reactions in the brain. Treatment for migraine may include over-the-counter or prescription medications. It may also include self-help techniques. These include relaxation training and biofeedback.  Q: What is an aura? A: About 15% of people with migraine get an "aura". This is a sign of neurological symptoms that occur before a migraine headache. You may see wavy or jagged lines, dots, or flashing lights. You might experience tunnel vision or blind spots in one or both eyes. The aura can include visual or auditory hallucinations (something imagined). It may include disruptions in smell (such as strange odors), taste or touch. Other symptoms include:  Numbness.  A "pins and needles" sensation.  Difficulty in recalling or speaking the correct word. These neurological events may last as long as 60 minutes. These symptoms will fade as the headache begins. Q: What is a trigger? A: Certain physical or environmental factors can lead to or "trigger" a migraine. These include:  Foods.  Hormonal changes.  Weather.  Stress. It is important to remember that triggers are different for everyone. To help  prevent migraine attacks, you need to figure out which triggers affect you. Keep a headache diary. This is a good way to track triggers. The diary will help you talk to your healthcare professional about your condition. Q: Does weather affect migraines? A: Bright sunshine, hot, humid conditions, and drastic changes in barometric pressure may lead to, or "trigger," a migraine attack in some people. But studies have shown that weather does not act as a trigger for everyone with migraines. Q: What is the link between migraine and hormones? A: Hormones start and regulate many of your body's functions. Hormones keep your body in balance within a constantly changing environment. The levels of hormones in your body are unbalanced at times. Examples are during menstruation, pregnancy, or menopause. That can lead to a migraine attack. In fact, about three quarters of all women with migraine report that their attacks are related to the menstrual cycle.  Q: Is there an increased risk of stroke for migraine sufferers? A: The likelihood of a migraine attack causing a stroke is very remote. That is not to say that migraine sufferers cannot have a stroke associated with their migraines. In persons under age 81, the most common associated factor for stroke is migraine headache. But over the course of a person's normal life span, the occurrence of migraine headache may actually be associated with a reduced risk of dying from cerebrovascular disease due to stroke.  Q: What are acute medications for migraine? A: Acute medications are used to treat the pain of the headache after it has started. Examples over-the-counter medications, NSAIDs, ergots, and triptans.  Q: What are the triptans? A: Triptans are the newest class of abortive medications. They are specifically targeted to treat migraine. Triptans are vasoconstrictors. They moderate some chemical reactions in the brain. The triptans work on receptors in your brain.  Triptans help to restore the balance of a neurotransmitter called serotonin. Fluctuations in levels of serotonin are thought to be a main cause of migraine.  Q: Are over-the-counter medications for migraine effective? A: Over-the-counter, or "OTC," medications may be effective in relieving mild to moderate pain and associated symptoms of migraine. But you should see your caregiver before beginning any treatment regimen for migraine.  Q: What are preventive medications for migraine? A: Preventive medications for migraine are sometimes referred to as "prophylactic" treatments. They are used to reduce the frequency, severity, and length of migraine attacks. Examples of preventive medications include antiepileptic medications, antidepressants, beta-blockers, calcium channel blockers, and NSAIDs (nonsteroidal anti-inflammatory drugs). Q: Why are anticonvulsants used to treat migraine? A: During the past few years, there has been an increased interest in antiepileptic drugs for the prevention of migraine. They are sometimes referred to as "anticonvulsants". Both epilepsy and migraine may be caused by similar reactions in the brain.  Q: Why are antidepressants used to treat migraine? A: Antidepressants are typically used to treat people with depression. They may reduce migraine frequency by regulating chemical levels, such as serotonin, in the brain.  Q: What alternative therapies are used to treat migraine? A: The term "alternative therapies" is often used to describe treatments considered outside the scope of conventional Western medicine. Examples of alternative therapy include acupuncture, acupressure, and yoga. Another common alternative treatment is herbal therapy. Some herbs are believed to relieve headache pain. Always discuss alternative therapies with your caregiver before proceeding. Some herbal products contain arsenic and other toxins. TENSION HEADACHES Q: What is a tension-type headache? What  causes it? How can I treat it? A: Tension-type headaches occur randomly. They are often the result of temporary stress, anxiety, fatigue, or anger. Symptoms include soreness in your temples, a tightening band-like sensation around your head (a "vice-like" ache). Symptoms can also include a pulling feeling, pressure sensations, and contracting head and neck muscles. The headache begins in your forehead, temples, or the back of your head and neck. Treatment for tension-type headache may include over-the-counter or prescription medications. Treatment may also include self-help techniques such as relaxation training and biofeedback. CLUSTER HEADACHES Q: What is a cluster headache? What causes it? How can I treat it? A: Cluster headache gets its name because the attacks come in groups. The pain arrives with little, if any, warning. It is usually on one side of the head. A tearing or bloodshot eye and a runny nose on the same side of the headache may also accompany the pain. Cluster headaches are believed to be caused by chemical reactions in the brain. They have been described as the most severe and intense of any headache type. Treatment for cluster headache includes prescription medication and oxygen. SINUS HEADACHES Q: What is a sinus headache? What causes it? How can I treat it? A: When a cavity in the bones of the face and skull (a sinus) becomes inflamed, the inflammation will cause localized pain. This condition is usually the result of an allergic reaction, a tumor, or an infection. If your headache is caused by a sinus blockage, such as an infection, you will probably have a fever. An x-ray will confirm a sinus blockage. Your caregiver's treatment might include antibiotics for the infection,  as well as antihistamines or decongestants.  REBOUND HEADACHES Q: What is a rebound headache? What causes it? How can I treat it? A: A pattern of taking acute headache medications too often can lead to a condition  known as "rebound headache." A pattern of taking too much headache medication includes taking it more than 2 days per week or in excessive amounts. That means more than the label or a caregiver advises. With rebound headaches, your medications not only stop relieving pain, they actually begin to cause headaches. Doctors treat rebound headache by tapering the medication that is being overused. Sometimes your caregiver will gradually substitute a different type of treatment or medication. Stopping may be a challenge. Regularly overusing a medication increases the potential for serious side effects. Consult a caregiver if you regularly use headache medications more than 2 days per week or more than the label advises. ADDITIONAL QUESTIONS AND ANSWERS Q: What is biofeedback? A: Biofeedback is a self-help treatment. Biofeedback uses special equipment to monitor your body's involuntary physical responses. Biofeedback monitors:  Breathing.  Pulse.  Heart rate.  Temperature.  Muscle tension.  Brain activity. Biofeedback helps you refine and perfect your relaxation exercises. You learn to control the physical responses that are related to stress. Once the technique has been mastered, you do not need the equipment any more. Q: Are headaches hereditary? A: Four out of five (80%) of people that suffer report a family history of migraine. Scientists are not sure if this is genetic or a family predisposition. Despite the uncertainty, a child has a 50% chance of having migraine if one parent suffers. The child has a 75% chance if both parents suffer.  Q: Can children get headaches? A: By the time they reach high school, most young people have experienced some type of headache. Many safe and effective approaches or medications can prevent a headache from occurring or stop it after it has begun.  Q: What type of doctor should I see to diagnose and treat my headache? A: Start with your primary caregiver. Discuss  his or her experience and approach to headaches. Discuss methods of classification, diagnosis, and treatment. Your caregiver may decide to recommend you to a headache specialist, depending upon your symptoms or other physical conditions. Having diabetes, allergies, etc., may require a more comprehensive and inclusive approach to your headache. The National [employer] Foundation will provide, upon request, a list of [facility] physician members in your state. Document Released: 04/01/2003 Document Revised: 04/03/2011 Document Reviewed: 09/09/2007 [facility] Patient Information 2015 [place], [place]. This information is not intended to replace advice given to you by your health care provider. Make sure you discuss any questions you have with your health care provider.

## 2014-02-24 ENCOUNTER — Other Ambulatory Visit: Payer: Self-pay | Admitting: Pulmonary Disease

## 2014-02-24 DIAGNOSIS — R0600 Dyspnea, unspecified: Secondary | ICD-10-CM

## 2014-02-25 ENCOUNTER — Ambulatory Visit: Payer: Self-pay | Admitting: Pulmonary Disease

## 2014-04-03 ENCOUNTER — Emergency Department (HOSPITAL_COMMUNITY): Payer: Medicare Other

## 2014-04-03 ENCOUNTER — Emergency Department (HOSPITAL_COMMUNITY)
Admission: EM | Admit: 2014-04-03 | Discharge: 2014-04-03 | Disposition: A | Discharge status: Home / Self Care | Payer: Medicare Other | Attending: Emergency Medicine | Admitting: Emergency Medicine

## 2014-04-03 ENCOUNTER — Encounter (HOSPITAL_COMMUNITY): Payer: Self-pay | Admitting: Emergency Medicine

## 2014-04-03 DIAGNOSIS — R52 Pain, unspecified: Secondary | ICD-10-CM | POA: Diagnosis present

## 2014-04-03 DIAGNOSIS — R197 Diarrhea, unspecified: Secondary | ICD-10-CM | POA: Insufficient documentation

## 2014-04-03 DIAGNOSIS — R011 Cardiac murmur, unspecified: Secondary | ICD-10-CM | POA: Diagnosis not present

## 2014-04-03 DIAGNOSIS — M791 Myalgia, unspecified site: Secondary | ICD-10-CM

## 2014-04-03 DIAGNOSIS — Z8742 Personal history of other diseases of the female genital tract: Secondary | ICD-10-CM | POA: Insufficient documentation

## 2014-04-03 DIAGNOSIS — Z8619 Personal history of other infectious and parasitic diseases: Secondary | ICD-10-CM | POA: Insufficient documentation

## 2014-04-03 DIAGNOSIS — Z7901 Long term (current) use of anticoagulants: Secondary | ICD-10-CM | POA: Insufficient documentation

## 2014-04-03 DIAGNOSIS — Z87891 Personal history of nicotine dependence: Secondary | ICD-10-CM | POA: Diagnosis not present

## 2014-04-03 DIAGNOSIS — Z79899 Other long term (current) drug therapy: Secondary | ICD-10-CM | POA: Insufficient documentation

## 2014-04-03 DIAGNOSIS — F419 Anxiety disorder, unspecified: Secondary | ICD-10-CM | POA: Insufficient documentation

## 2014-04-03 DIAGNOSIS — Z86711 Personal history of pulmonary embolism: Secondary | ICD-10-CM | POA: Diagnosis not present

## 2014-04-03 DIAGNOSIS — R5383 Other fatigue: Secondary | ICD-10-CM | POA: Diagnosis not present

## 2014-04-03 DIAGNOSIS — Z8701 Personal history of pneumonia (recurrent): Secondary | ICD-10-CM | POA: Insufficient documentation

## 2014-04-03 DIAGNOSIS — Z792 Long term (current) use of antibiotics: Secondary | ICD-10-CM | POA: Diagnosis not present

## 2014-04-03 DIAGNOSIS — Z86718 Personal history of other venous thrombosis and embolism: Secondary | ICD-10-CM | POA: Insufficient documentation

## 2014-04-03 DIAGNOSIS — I10 Essential (primary) hypertension: Secondary | ICD-10-CM | POA: Insufficient documentation

## 2014-04-03 DIAGNOSIS — Z87442 Personal history of urinary calculi: Secondary | ICD-10-CM | POA: Insufficient documentation

## 2014-04-03 DIAGNOSIS — J441 Chronic obstructive pulmonary disease with (acute) exacerbation: Secondary | ICD-10-CM | POA: Insufficient documentation

## 2014-04-03 DIAGNOSIS — K219 Gastro-esophageal reflux disease without esophagitis: Secondary | ICD-10-CM | POA: Insufficient documentation

## 2014-04-03 DIAGNOSIS — M159 Polyosteoarthritis, unspecified: Secondary | ICD-10-CM | POA: Diagnosis not present

## 2014-04-03 LAB — URINALYSIS, ROUTINE W REFLEX MICROSCOPIC
BILIRUBIN URINE: NEGATIVE
Glucose, UA: NEGATIVE mg/dL
Hgb urine dipstick: NEGATIVE
KETONES UR: NEGATIVE mg/dL
NITRITE: NEGATIVE
PROTEIN: NEGATIVE mg/dL
Specific Gravity, Urine: 1.017 (ref 1.005–1.030)
UROBILINOGEN UA: 1 mg/dL (ref 0.0–1.0)
pH: 6 (ref 5.0–8.0)

## 2014-04-03 LAB — PROTIME-INR
INR: 1.32 (ref 0.00–1.49)
Prothrombin Time: 16.5 seconds — ABNORMAL HIGH (ref 11.6–15.2)

## 2014-04-03 LAB — COMPREHENSIVE METABOLIC PANEL
ALK PHOS: 85 U/L (ref 39–117)
ALT: 15 U/L (ref 0–35)
ANION GAP: 7 (ref 5–15)
AST: 19 U/L (ref 0–37)
Albumin: 4 g/dL (ref 3.5–5.2)
BILIRUBIN TOTAL: 0.8 mg/dL (ref 0.3–1.2)
BUN: 15 mg/dL (ref 6–23)
CHLORIDE: 110 mmol/L (ref 96–112)
CO2: 25 mmol/L (ref 19–32)
Calcium: 9.4 mg/dL (ref 8.4–10.5)
Creatinine, Ser: 1.01 mg/dL (ref 0.50–1.10)
GFR calc non Af Amer: 61 mL/min — ABNORMAL LOW (ref >90)
GFR, EST AFRICAN AMERICAN: 70 mL/min — AB (ref >90)
GLUCOSE: 108 mg/dL — AB (ref 70–99)
Potassium: 3.2 mmol/L — ABNORMAL LOW (ref 3.5–5.1)
Sodium: 142 mmol/L (ref 135–145)
TOTAL PROTEIN: 7.1 g/dL (ref 6.0–8.3)

## 2014-04-03 LAB — URINE MICROSCOPIC-ADD ON

## 2014-04-03 LAB — CBC WITH DIFFERENTIAL/PLATELET
Basophils Absolute: 0 10*3/uL (ref 0.0–0.1)
Basophils Relative: 0 % (ref 0–1)
EOS ABS: 0 10*3/uL (ref 0.0–0.7)
Eosinophils Relative: 0 % (ref 0–5)
HEMATOCRIT: 38.1 % (ref 36.0–46.0)
HEMOGLOBIN: 12.8 g/dL (ref 12.0–15.0)
Lymphocytes Relative: 25 % (ref 12–46)
Lymphs Abs: 1.2 10*3/uL (ref 0.7–4.0)
MCH: 29.6 pg (ref 26.0–34.0)
MCHC: 33.6 g/dL (ref 30.0–36.0)
MCV: 88 fL (ref 78.0–100.0)
MONOS PCT: 4 % (ref 3–12)
Monocytes Absolute: 0.2 10*3/uL (ref 0.1–1.0)
Neutro Abs: 3.3 10*3/uL (ref 1.7–7.7)
Neutrophils Relative %: 70 % (ref 43–77)
Platelets: 116 10*3/uL — ABNORMAL LOW (ref 150–400)
RBC: 4.33 MIL/uL (ref 3.87–5.11)
RDW: 13.1 % (ref 11.5–15.5)
WBC: 4.7 10*3/uL (ref 4.0–10.5)

## 2014-04-03 LAB — LIPASE, BLOOD: LIPASE: 40 U/L (ref 11–59)

## 2014-04-03 LAB — TROPONIN I: Troponin I: <0.03 ng/mL (ref <0.031)

## 2014-04-03 LAB — I-STAT CG4 LACTIC ACID, ED: Lactic Acid, Venous: 1.52 mmol/L (ref 0.5–2.0)

## 2014-04-03 MED ORDER — MORPHINE SULFATE 4 MG/ML IJ SOLN
4.0000 mg | Freq: Once | INTRAMUSCULAR | Status: AC
  Administered 2014-04-03: 4 mg via INTRAVENOUS
  Filled 2014-04-03: qty 1

## 2014-04-03 MED ORDER — SODIUM CHLORIDE 0.9 % IV BOLUS (SEPSIS)
500.0000 mL | Freq: Once | INTRAVENOUS | Status: AC
  Administered 2014-04-03: 500 mL via INTRAVENOUS

## 2014-04-03 MED ORDER — TRAMADOL HCL 50 MG PO TABS: 50.0000 mg | ORAL_TABLET | Freq: Four times a day (QID) | ORAL | Status: DC | PRN

## 2014-04-03 MED ORDER — MORPHINE SULFATE 4 MG/ML IJ SOLN
4.0000 mg | INTRAMUSCULAR | Status: AC
  Administered 2014-04-03: 4 mg via INTRAVENOUS
  Filled 2014-04-03: qty 1

## 2014-04-03 MED ORDER — ONDANSETRON HCL 4 MG/2ML IJ SOLN
4.0000 mg | Freq: Once | INTRAMUSCULAR | Status: AC
  Administered 2014-04-03: 4 mg via INTRAVENOUS
  Filled 2014-04-03: qty 2

## 2014-04-03 NOTE — ED Notes (Signed)
Pt ambulated to restroom with steady gait.

## 2014-04-03 NOTE — ED Notes (Signed)
Pt c/o headaches and body aches, stating she just feels bad. Pt also states she wants to be taken off of xarelto because she does not have any more spots on her lungs.

## 2014-04-03 NOTE — Discharge Instructions (Signed)
Possible Clostridium Difficile Infection   THIS TYPE OF DIARRHEA CAN RESULT IN SERIOUS ILLNESS. TRY TO SUBMIT A SPECIMEN TO THE LAB AS SOON AS YOU CAN COLLECT IT.  Clostridium difficile (C. difficile) is a bacteria found in the intestinal tract or colon. Under certain conditions, it causes diarrhea and sometimes severe disease. The severe form of the disease is known as pseudomembranous colitis (often called C. difficile colitis). This disease can damage the lining of the colon or cause the colon to become enlarged (toxic megacolon). CAUSES Your colon normally contains many different bacteria, including C. difficile. The balance of bacteria in your colon can change during illness. This is especially true when you take antibiotic medicine. Taking antibiotics may allow the C. difficile to grow, multiply excessively, and make a toxin that then causes illness. The elderly and people with certain medical conditions have a greater risk of getting C. difficile infections. SYMPTOMS  Watery diarrhea.  Fever.  Fatigue.  Loss of appetite.  Nausea.  Abdominal swelling, pain, or tenderness.  Dehydration. DIAGNOSIS Your symptoms may make your caregiver suspect a C. difficile infection, especially if you have used antibiotics in the preceding weeks. However, there are only 2 ways to know for certain whether you have a C. difficile infection:  A lab test that finds the toxin in your stool.  The specific appearance of an abnormality (pseudomembrane) in your colon. This can only be seen by doing a sigmoidoscopy or colonoscopy. These procedures involve passing an instrument through your rectum to look at the inside of your colon. Your caregiver will help determine if these tests are necessary. TREATMENT  Most people are successfully treated with one of two specific antibiotics, usually given by mouth. Other antibiotics you are receiving are stopped if possible.  Intravenous (IV) fluids and correction  of electrolyte imbalance may be necessary.  Rarely, surgery may be needed to remove the infected part of the intestines.  Careful hand washing by you and your caregivers is important to prevent the spread of infection. In the hospital, your caregivers may also put on gowns and gloves to prevent the spread of the C. difficile bacteria. Your room is also cleaned regularly with a solution containing bleach or a product that is known to kill C. difficile. HOME CARE INSTRUCTIONS  Drink enough fluids to keep your urine clear or pale yellow. Avoid milk, caffeine, and alcohol.  Ask your caregiver for specific rehydration instructions.  Try eating small, frequent meals rather than large meals.  Take your antibiotics as directed. Finish them even if you start to feel better.  Do not use medicines to slow diarrhea. This could delay healing or cause complications.  Wash your hands thoroughly after using the bathroom and before preparing food.  Make sure people who live with you wash their hands often, too.  Carefully disinfect all surfaces with a product that contains chlorine bleach. SEEK MEDICAL CARE IF:  Diarrhea persists longer than expected or recurs after completing your course of antibiotic treatment for the C. difficile infection.  You have trouble staying hydrated. SEEK IMMEDIATE MEDICAL CARE IF:  You develop a new fever.  You have increasing abdominal pain or tenderness.  There is blood in your stools, or your stools are dark black and tarry.  You cannot hold down food or liquids. MAKE SURE YOU:  Understand these instructions.  Will watch your condition.  Will get help right away if you are not doing well or get worse. Document Released: 10/19/2004 Document Revised: 05/26/2013  Document Reviewed: 06/17/2010 [facility] Patient Information 2015 [place], [place]. This information is not intended to replace advice given to you by your health care provider. Make sure you discuss any  questions you have with your health care provider.

## 2014-04-03 NOTE — ED Provider Notes (Addendum)
CSN: [id number]     Arrival date & time 04/03/14  1341 History   First MD Initiated Contact with Patient 04/03/14 1511     Chief Complaint  Patient presents with  . Headache  . Generalized Body Aches     (Consider location/radiation/quality/duration/timing/severity/associated sxs/prior Treatment) HPI The patient is not felt well since her hospitalization and starting Xarelto last fall. 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 She is currently undergoing anti-retroviral therapy for that. Although the symptoms have been ongoing, over the past week she has felt worse. She has had diarrhea approximately 3 episodes per day. She denies fever. She reports reports generalized aching which is worst in her arms and legs. She also endorses abdominal pain which is generalized in nature. She has a history of COPD and denies that her cough has gotten any worse than what she would consider a baseline. She continues to use her inhalers and quit smoking since last fall. She also reports easy bruising and intermittent nosebleeds. She reports weight loss since last fall. She and her daughter feel that her symptoms are likely due to his Xarelto and would like to discontinue this medication. She reports that she was supposed to see her primary care doctor yesterday and wanted to resolve this issue with his Xarelto. She reports however there has been a problem with her Medicaid and they're appointment was postponed or canceled and they were unable to be seen. Past Medical History  Diagnosis Date  . Hypertension   . COPD (chronic obstructive pulmonary disease)   . Cirrhosis   . Pulmonary emboli 09/2013  . Renal insufficiency   . Kidney stones   . Heart murmur   . DVT (deep venous thrombosis) 09/2013    RLE  . Pneumonia     "4 times in the past year" (10/30/2013)   . History of blood transfusion     "due to excessive blood loss before hysterectomy"  . GERD (gastroesophageal reflux disease)   . Hepatitis B   . Arthritis     "knees; lower back" (10/30/2013)  . Anxiety   . Depression   . Bipolar disorder    Past Surgical History  Procedure Laterality Date  . Carpal tunnel release Left   . Fracture surgery    . Ankle fracture surgery Left   . Abdominal hysterectomy    . Tubal ligation    . Dilation and curettage of uterus  X 2   Family History  Problem Relation Age of Onset  . Stroke Mother   . Lung cancer Father    History  Substance Use Topics  . Smoking status: Former Smoker -- 1.00 packs/day for 41 years    Types: Cigarettes    Quit date: 10/02/2013  . Smokeless tobacco: Never Used  . Alcohol Use: Yes     Comment: "stopped drinking  in 2004"   OB History    No data available     Review of Systems 10 Systems reviewed and are negative for acute change except as noted in the HPI.   Allergies  Cymbalta; Elavil; Heparin; Duloxetine; Gabapentin; and Neomycin-bacitracin zn-polymyx  Home Medications   Prior to Admission medications   Medication Sig Start Date End Date Taking? Authorizing Provider  acetaminophen (TYLENOL) 325 MG tablet Take 325 mg by mouth every 6 (six) hours as needed for moderate pain (back &  chest pain).   Yes Historical Provider, MD  albuterol (PROVENTIL HFA;VENTOLIN HFA) 108 (90 BASE) MCG/ACT inhaler Inhale 2 puffs into the lungs every 6 (six) hours as needed for wheezing or shortness of breath (shortness of breath & wheezing). For shortness of breath   Yes Historical Provider, MD  budesonide-formoterol (SYMBICORT) 160-4.5 MCG/ACT inhaler Inhale 2 puffs into the lungs 2 (two) times daily.     Yes Historical Provider, MD  citalopram (CELEXA) 20 MG tablet Take 20 mg by mouth at bedtime.   Yes Historical Provider, MD  entecavir (BARACLUDE) 0.5 MG tablet Take 0.5 mg by mouth every other day.    Yes Historical  Provider, MD  Iloperidone (FANAPT) 6 MG TABS Take 12 mg by mouth every morning.    Yes Historical Provider, MD  pantoprazole (PROTONIX) 40 MG tablet Take 40 mg by mouth daily. 12/23/13  Yes Historical Provider, MD  rivaroxaban (XARELTO) 20 MG TABS tablet Take 1 tablet (20 mg total) by mouth daily with supper. 11/02/13  Yes Shanker Kristeen Mans, MD  spironolactone (ALDACTONE) 50 MG tablet Take 2 tablets (100 mg total) by mouth daily. 12/16/13  Yes Bonnielee Haff, MD  tenofovir (VIREAD) 300 MG tablet Take 300 mg by mouth every other day.   Yes Historical Provider, MD  tiotropium (SPIRIVA) 18 MCG inhalation capsule Place 18 mcg into inhaler and inhale daily.     Yes Historical Provider, MD  ALPRAZolam Duanne Moron) 1 MG tablet Take 1 tablet (1 mg total) by mouth 3 (three) times daily as needed. For anxiety 11/02/13   Shanker Kristeen Mans, MD  levofloxacin (LEVAQUIN) 500 MG tablet Take 1 tablet (500 mg total) by mouth daily. For 3 weeks. Patient not taking: Reported on 02/07/2014 12/16/13   Bonnielee Haff, MD  traMADol (ULTRAM) 50 MG tablet Take 1 tablet (50 mg total) by mouth 2 (two) times daily. 11/02/13   Shanker Kristeen Mans, MD   BP 106/63 mmHg  Pulse 48  Temp(Src) 97.8 F (36.6 C) (Oral)  Resp 21  SpO2 95% Physical Exam  Constitutional: She is oriented to person, place, and time. She appears well-developed and well-nourished.  The patient is mildly deconditioned. She is however nontoxic and alert. No respiratory distress.  HENT:  Head: Normocephalic and atraumatic.  Nose: Nose normal.  Mouth/Throat: Oropharynx is clear and moist. No oropharyngeal exudate.  Eyes: EOM are normal. Pupils are equal, round, and reactive to light.  Neck: Neck supple.  Cardiovascular: Normal rate, regular rhythm, normal heart sounds and intact distal pulses.   Pulmonary/Chest: Effort normal. No respiratory distress. She has no rales.  The patient has an upper airway inspiratory wheeze predominantly on the right mid lung  field. There is approximately 8 cm older bruise on the right breast. This is in the resolution phase. There is no surrounding erythema.  Abdominal: Soft. Bowel sounds are normal. She exhibits no distension and no mass. There is no tenderness. There is no rebound and no guarding.  Musculoskeletal: Normal range of motion. She exhibits no edema.  The condition of the patient's extremities is normal. Her lower extremities are symmetric and well-developed. She does not have any peripheral edema. There are no joint effusions. The skin condition is excellent. She does endorse pain to palpation throughout the extremities however there is no localizing finding. The upper extremities as well are normal appearance without erythema or abnormal swelling. All the distal pulses are 2+ and symmetric.  Neurological: She is alert and oriented to person, place, and time. She has  normal strength. No cranial nerve deficit. She exhibits normal muscle tone. Coordination normal. GCS eye subscore is 4. GCS verbal subscore is 5. GCS motor subscore is 6.  Skin: Skin is warm, dry and intact.  Psychiatric:  Patient seems mildly depressed.    ED Course  Procedures (including critical care time) Labs Review Labs Reviewed  COMPREHENSIVE METABOLIC PANEL - Abnormal; Notable for the following:    Potassium 3.2 (*)    Glucose, Bld 108 (*)    GFR calc non Af Amer 61 (*)    GFR calc Af Amer 70 (*)    All other components within normal limits  CBC WITH DIFFERENTIAL/PLATELET - Abnormal; Notable for the following:    Platelets 116 (*)    All other components within normal limits  PROTIME-INR - Abnormal; Notable for the following:    Prothrombin Time 16.5 (*)    All other components within normal limits  URINALYSIS, ROUTINE W REFLEX MICROSCOPIC - Abnormal; Notable for the following:    APPearance CLOUDY (*)    Leukocytes, UA MODERATE (*)    All other components within normal limits  URINE MICROSCOPIC-ADD ON - Abnormal; Notable  for the following:    Squamous Epithelial / LPF MANY (*)    Bacteria, UA FEW (*)    All other components within normal limits  CULTURE, BLOOD (ROUTINE X 2)  CULTURE, BLOOD (ROUTINE X 2)  CLOSTRIDIUM DIFFICILE BY PCR  STOOL CULTURE  URINE CULTURE  LIPASE, BLOOD  TROPONIN I  INFLUENZA PANEL BY PCR (TYPE A & B, H1N1)  I-STAT CG4 LACTIC ACID, ED    Imaging Review Dg Chest 2 View  04/03/2014   CLINICAL DATA:  Headache, generalized body aches and diarrhea for 1 week, history COPD, hypertension, former smoker, cirrhosis, pulmonary embolism  EXAM: CHEST  2 VIEW  COMPARISON:  02/07/2014  FINDINGS: Normal heart size, mediastinal contours, and pulmonary vascularity.  Minimal chronic peribronchial thickening.  No acute infiltrate, pleural effusion or pneumothorax.  Scarring RIGHT upper lobe stable.  Bones demineralized.  IMPRESSION: Minimal bronchitic changes and RIGHT upper lobe scarring.  No acute abnormalities.   Electronically Signed   By: Lavonia Dana M.D.   On: 04/03/2014 17:12     EKG Interpretation   Date/Time:  Friday April 03 2014 16:01:46 EST Ventricular Rate:  52 PR Interval:  139 QRS Duration: 99 QT Interval:  427 QTC Calculation: 397 R Axis:   1 Text Interpretation:  Sinus rhythm no change from previous Confirmed by  Johnney Killian, MD, Jeannie Done [phone]) on 04/03/2014 5:18:56 PM      MDM   Final diagnoses:  Diarrhea  Myalgia  Other fatigue  One of the patient's concerns today was the goal of trying to discontinue Xarelto. I have reviewed her medical record and find a note from pulmonology that the patient had prior PE as well and was most likely a candidate for lifelong anticoagulation. I have counseled the patient on this and suggest that she have another evaluation with pulmonology if she should sincerely wish to discontinue her anticoagulation. She has been counseled on the risks of self discontinuing the medication despite the fact that she thinks that is making her feel ill. At  this point the patient's more acute illness I believe is viral in nature. She has had diarrhea for approximately a week and generalized myalgias throughout the extremities. Her labs not indicate acute dehydration or renal insufficiency. She has no leukocytosis and her liver functions are normal. At this time I do  feel she is safe for continued outpatient management with close follow-up with her primary providers. The urinalysis was a contaminated specimen with many squamous epithelial cells and few bacteria, a culture will be ordered. Blood cultures were ordered as well upon the patient's initial presentation and these will be pending, however at this time I do not have high suspicion for bacteremia. I did consider C. difficile as a possible source of the patient's diarrheal illness. She has not been able to produce a stool specimen here in the emergency department and clinically she is not ill in appearance, thus I feel that this can be collected on an outpatient basis. Instructions have been provided.    Charlesetta Shanks, MD 04/03/14 1930  Charlesetta Shanks, MD 04/03/14 [phone]

## 2014-04-03 NOTE — ED Notes (Signed)
Pt requesting more pain medication.

## 2014-04-04 LAB — INFLUENZA PANEL BY PCR (TYPE A & B)
H1N1FLUPCR: NOT DETECTED
INFLAPCR: NEGATIVE
INFLBPCR: NEGATIVE

## 2014-04-05 LAB — URINE CULTURE: Colony Count: >=100000

## 2014-04-09 LAB — CULTURE, BLOOD (ROUTINE X 2): Culture: NO GROWTH

## 2014-07-10 ENCOUNTER — Other Ambulatory Visit (HOSPITAL_COMMUNITY): Payer: Self-pay | Admitting: Gastroenterology

## 2014-07-10 DIAGNOSIS — B181 Chronic viral hepatitis B without delta-agent: Secondary | ICD-10-CM

## 2014-07-10 DIAGNOSIS — B182 Chronic viral hepatitis C: Secondary | ICD-10-CM

## 2014-07-10 DIAGNOSIS — Z1289 Encounter for screening for malignant neoplasm of other sites: Secondary | ICD-10-CM

## 2014-07-10 DIAGNOSIS — K745 Biliary cirrhosis, unspecified: Secondary | ICD-10-CM

## 2014-07-16 ENCOUNTER — Other Ambulatory Visit (HOSPITAL_COMMUNITY): Payer: Self-pay | Admitting: Obstetrics and Gynecology

## 2014-07-16 ENCOUNTER — Ambulatory Visit (HOSPITAL_COMMUNITY): Admission: RE | Admit: 2014-07-16 | Payer: Medicare Other | Source: Ambulatory Visit

## 2014-07-16 DIAGNOSIS — Z1231 Encounter for screening mammogram for malignant neoplasm of breast: Secondary | ICD-10-CM

## 2014-07-21 ENCOUNTER — Other Ambulatory Visit: Payer: Self-pay | Admitting: Gastroenterology

## 2014-07-24 ENCOUNTER — Ambulatory Visit (HOSPITAL_COMMUNITY)
Admission: RE | Admit: 2014-07-24 | Discharge: 2014-07-24 | Disposition: A | Discharge status: Home / Self Care | Payer: Medicare Other | Source: Ambulatory Visit | Attending: Obstetrics and Gynecology | Admitting: Obstetrics and Gynecology

## 2014-07-24 ENCOUNTER — Ambulatory Visit (HOSPITAL_COMMUNITY)
Admission: RE | Admit: 2014-07-24 | Discharge: 2014-07-24 | Disposition: A | Discharge status: Home / Self Care | Payer: Medicare Other | Source: Ambulatory Visit | Attending: Gastroenterology | Admitting: Gastroenterology

## 2014-07-24 DIAGNOSIS — N281 Cyst of kidney, acquired: Secondary | ICD-10-CM | POA: Diagnosis not present

## 2014-07-24 DIAGNOSIS — Z1231 Encounter for screening mammogram for malignant neoplasm of breast: Secondary | ICD-10-CM

## 2014-07-24 DIAGNOSIS — B182 Chronic viral hepatitis C: Secondary | ICD-10-CM

## 2014-07-24 DIAGNOSIS — Z1289 Encounter for screening for malignant neoplasm of other sites: Secondary | ICD-10-CM | POA: Insufficient documentation

## 2014-07-24 DIAGNOSIS — B181 Chronic viral hepatitis B without delta-agent: Secondary | ICD-10-CM

## 2014-07-24 DIAGNOSIS — R161 Splenomegaly, not elsewhere classified: Secondary | ICD-10-CM | POA: Insufficient documentation

## 2014-07-24 DIAGNOSIS — K745 Biliary cirrhosis, unspecified: Secondary | ICD-10-CM

## 2014-07-28 ENCOUNTER — Ambulatory Visit: Payer: Self-pay | Admitting: Pulmonary Disease

## 2014-08-05 ENCOUNTER — Ambulatory Visit: Payer: Self-pay | Admitting: Internal Medicine

## 2014-08-12 ENCOUNTER — Encounter: Payer: Self-pay | Admitting: Internal Medicine

## 2014-08-12 ENCOUNTER — Ambulatory Visit (INDEPENDENT_AMBULATORY_CARE_PROVIDER_SITE_OTHER): Payer: Medicare Other | Admitting: Internal Medicine

## 2014-08-12 ENCOUNTER — Telehealth: Payer: Self-pay | Admitting: Internal Medicine

## 2014-08-12 VITALS — BP 112/64 | HR 64 | Ht 64.0 in | Wt 214.0 lb

## 2014-08-12 DIAGNOSIS — E669 Obesity, unspecified: Secondary | ICD-10-CM

## 2014-08-12 DIAGNOSIS — I2699 Other pulmonary embolism without acute cor pulmonale: Secondary | ICD-10-CM

## 2014-08-12 DIAGNOSIS — J449 Chronic obstructive pulmonary disease, unspecified: Secondary | ICD-10-CM

## 2014-08-12 NOTE — Progress Notes (Signed)
Subjective:    Patient ID: [name], female    DOB: [birth year], 59 y.o.   MRN: [id number]    Brief patient profile:  59 yo qiot smoking oct 2015 with h/o  cocaine use with etoh cirrhosis seen by PCCM during recurrent hospitalizations for PNA and RUL cavitary lesion She was dx w/ PE in 09/2013, started on Xarelto.  She has had a complicated last 3 months with admission for PE in Sept then in Oct admitted with septic shock, pseudomonal bacteremia and then readmitted  Nov with cavitary PNA in RUL   She underwent CT Chest >> no PE, dense RUL opacity, worrisome for cavitary PNA, tree-in-bud & patchy opacities RUL, B LL. New from prior CT in 10/08/13 that was positive for PE. Workup showed Quantiferon Gold >> negative  Echo >> EF 60 to 16%, grade 1 diastolic dysfx, mod LA dilation, PAS 32 mmHg Sputum for AFBneg.  She was started on aggressive abx and discharged this last admission on 3 weeks of Levaquin.   rec Finish Levaquin as directed   Continue on Symbicort and Spiriva .  We will set you up for a CT chest in 4 weeks  Follow up Dr. Elsworth Soho  In 5 weeks with PFT> did not return      08/12/2014 f/u ov/Danielly Ackerley re: copd / ab on symbicort only  Chief Complaint  Patient presents with  . Follow-up    Pt of Dr Elsworth Soho- she states that she is here for f/u per her PCP Dr. Earma Reading. She states that she has occ wheezing, but otherwise her breathing is doing well. She uses proair approx 4 x per day.    Does fine at gym s sob/ some subj wheeze no worse off saba x one month (was using 4x daily for "wheeze" but no change off it )  No obvious day to day or daytime variability or assoc chronic cough or cp or chest tightness, subjective wheeze or overt sinus or hb symptoms. No unusual exp hx or h/o childhood pna/ asthma or knowledge of premature birth.  Sleeping ok without nocturnal  or early am exacerbation  of respiratory  c/o's or need for noct saba. Also denies any obvious fluctuation of symptoms with  weather or environmental changes or other aggravating or alleviating factors except as outlined above   Current Medications, Allergies, Complete Past Medical History, Past Surgical History, Family History, and Social History were reviewed in [employer] record.  ROS  The following are not active complaints unless bolded sore throat, dysphagia, dental problems, itching, sneezing,  nasal congestion or excess/ purulent secretions, ear ache,   fever, chills, sweats, unintended wt loss, classically pleuritic or exertional cp, hemoptysis,  orthopnea pnd or leg swelling, presyncope, palpitations, abdominal pain, anorexia, nausea, vomiting, diarrhea  or change in bowel or bladder habits, change in stools or urine, dysuria,hematuria,  rash, arthralgias, visual complaints, headache, numbness, weakness or ataxia or problems with walking or coordination,  change in mood/affect or memory.             Objective:   Physical Exam GEN: A/Ox3; pleasant ,  obese amb nad  Wt Readings from Last 3 Encounters:  08/12/14 214 lb (97.07 kg)  12/30/13 199 lb 3.2 oz (90.357 kg)  12/09/13 202 lb 13.2 oz (92 kg)    Vital signs reviewed   HEENT:  [place]/AT,  EACs-clear, TMs-wnl, NOSE-clear, THROAT-clear, no lesions, no postnasal drip or exudate noted.   NECK:  Supple w/ fair  ROM; no JVD; normal carotid impulses w/o bruits; no thyromegaly or nodules palpated; no lymphadenopathy.  RESP    no accessory muscle use, no dullness to percussion/ insp and exp rhonchi mostly upper airway   CARD:  RRR, no m/r/g  , no peripheral edema, pulses intact, no cyanosis or clubbing.  GI:   Soft & nt; nml bowel sounds; no organomegaly or masses detected.  Musco: Warm bil, no deformities or joint swelling noted.   Neuro: alert, no focal deficits noted.    Skin: Warm, no lesions or rashes    I personally reviewed images and agree with radiology impression as follows:  CXR:  04/03/14 Minimal bronchitic changes  and RIGHT upper lobe scarring.  No acute abnormalities.          Assessment & Plan:

## 2014-08-12 NOTE — Patient Instructions (Addendum)
Weight control is simply a matter of calorie balance which needs to be tilted in your favor by eating less and exercising more.  To get the most out of exercise, you need to be continuously aware that you are short of breath, but never out of breath, for 30 minutes daily. As you improve, it will actually be easier for you to do the same amount of exercise  in  30 minutes so always push to the level where you are short of breath.  If this does not result in gradual weight reduction then I strongly recommend you see a nutritionist with a food diary x 2 weeks so that we can work out a negative calorie balance which is universally effective in steady weight loss programs.  Think of your calorie balance like you do your bank account where in this case you want the balance to go down so you must take in less calories than you burn up.  It's just that simple:  Hard to do, but easy to understand.  Good luck!   Please schedule a follow up office visit in 6 weeks, call sooner if needed with [name] with pfts on return

## 2014-08-12 NOTE — Assessment & Plan Note (Addendum)
The proper method of use, as well as anticipated side effects, of a metered-dose inhaler are discussed and demonstrated to the patient. Improved effectiveness after extensive coaching during this visit to a level of approximately  90%   I had an extended discussion with the patient reviewing all relevant studies completed to date and  lasting 24minutes of a 40 minute visit on the following ongoing concerns:   1) s/p smoking cessation she has stabilized symptomatically and doing fine on just symbicort so should continue th 160 2bid  2) PE/dvt and obesity discussed separately   3) Each maintenance medication was reviewed in detail including most importantly the difference between maintenance and as needed and under what circumstances the prns are to be used.  Please see instructions for details which were reviewed in writing and the patient given a copy.    4) needs to return with Dr Elsworth Soho 6 weeks with pfts

## 2014-08-12 NOTE — Assessment & Plan Note (Signed)
Body mass index is 36.72 kg/(m^2).  Lab Results  Component Value Date   TSH 2.740 12/09/2013     Contributing to gerd tendency/ doe/ risk for recurrent pe >>needs to achieve and maintain neg calorie balance, reviewed > f/u primary care

## 2014-08-12 NOTE — Assessment & Plan Note (Addendum)
-   10/08/13 hypercoagulability screen Pos lupus anticoagulant  10/27/13 venous dopplers - Findings consistent with subacute deep vein thrombosis involving the left femoral vein and left popliteal vein. 12/10/13 2d Echo Left ventricle: The cavity size was normal. Systolic function was normal. The estimated ejection fraction was in the range of 60% to 65%. Wall motion was normal; there were no regional wall motion abnormalities. Doppler parameters are consistent with abnormal left ventricular relaxation (grade 1 diastolic dysfunction). - Left atrium: The atrium was moderately dilated. - Right atrium: The atrium was mildly dilated. - Atrial septum: No defect or patent foramen ovale was identified. - Pulmonary arteries: PA peak pressure: 32 mm Hg  Advised the main apparent risk of recurrence is related to wt and L dvt so before she stops the xarelto would need a repeat venous doppler per whoever makes the decision to stop it and assess the other risk factors : obesity and lupus anticoagulant (only a relative /not an absolute indication)

## 2014-08-12 NOTE — Telephone Encounter (Signed)
I have faxed over note from today to PCP. Pt aware. Nothing further needed

## 2014-08-23 ENCOUNTER — Emergency Department (HOSPITAL_COMMUNITY)
Admission: EM | Admit: 2014-08-23 | Discharge: 2014-08-23 | Disposition: A | Discharge status: Home / Self Care | Payer: Medicare Other | Attending: Emergency Medicine | Admitting: Emergency Medicine

## 2014-08-23 ENCOUNTER — Encounter (HOSPITAL_COMMUNITY): Payer: Self-pay | Admitting: *Deleted

## 2014-08-23 ENCOUNTER — Emergency Department (HOSPITAL_COMMUNITY): Payer: Medicare Other

## 2014-08-23 DIAGNOSIS — Z87448 Personal history of other diseases of urinary system: Secondary | ICD-10-CM | POA: Insufficient documentation

## 2014-08-23 DIAGNOSIS — Z8701 Personal history of pneumonia (recurrent): Secondary | ICD-10-CM | POA: Diagnosis not present

## 2014-08-23 DIAGNOSIS — Z87891 Personal history of nicotine dependence: Secondary | ICD-10-CM | POA: Insufficient documentation

## 2014-08-23 DIAGNOSIS — F419 Anxiety disorder, unspecified: Secondary | ICD-10-CM | POA: Diagnosis not present

## 2014-08-23 DIAGNOSIS — F319 Bipolar disorder, unspecified: Secondary | ICD-10-CM | POA: Insufficient documentation

## 2014-08-23 DIAGNOSIS — Z86718 Personal history of other venous thrombosis and embolism: Secondary | ICD-10-CM | POA: Insufficient documentation

## 2014-08-23 DIAGNOSIS — M1990 Unspecified osteoarthritis, unspecified site: Secondary | ICD-10-CM | POA: Insufficient documentation

## 2014-08-23 DIAGNOSIS — Z8619 Personal history of other infectious and parasitic diseases: Secondary | ICD-10-CM | POA: Insufficient documentation

## 2014-08-23 DIAGNOSIS — Z86711 Personal history of pulmonary embolism: Secondary | ICD-10-CM | POA: Insufficient documentation

## 2014-08-23 DIAGNOSIS — Z79899 Other long term (current) drug therapy: Secondary | ICD-10-CM | POA: Insufficient documentation

## 2014-08-23 DIAGNOSIS — K219 Gastro-esophageal reflux disease without esophagitis: Secondary | ICD-10-CM | POA: Insufficient documentation

## 2014-08-23 DIAGNOSIS — R011 Cardiac murmur, unspecified: Secondary | ICD-10-CM | POA: Diagnosis not present

## 2014-08-23 DIAGNOSIS — I10 Essential (primary) hypertension: Secondary | ICD-10-CM | POA: Insufficient documentation

## 2014-08-23 DIAGNOSIS — Z87442 Personal history of urinary calculi: Secondary | ICD-10-CM | POA: Insufficient documentation

## 2014-08-23 DIAGNOSIS — Z7901 Long term (current) use of anticoagulants: Secondary | ICD-10-CM | POA: Insufficient documentation

## 2014-08-23 DIAGNOSIS — M25512 Pain in left shoulder: Secondary | ICD-10-CM | POA: Diagnosis present

## 2014-08-23 DIAGNOSIS — J449 Chronic obstructive pulmonary disease, unspecified: Secondary | ICD-10-CM | POA: Insufficient documentation

## 2014-08-23 MED ORDER — HYDROCODONE-ACETAMINOPHEN 5-325 MG PO TABS
1.0000 | ORAL_TABLET | Freq: Once | ORAL | Status: AC
  Administered 2014-08-23: 1 via ORAL
  Filled 2014-08-23: qty 1

## 2014-08-23 MED ORDER — HYDROCODONE-ACETAMINOPHEN 5-325 MG PO TABS: 1.0000 | ORAL_TABLET | ORAL | Status: DC | PRN

## 2014-08-23 NOTE — ED Provider Notes (Signed)
CSN: [id number]     Arrival date & time 08/23/14  1530 History  This chart was scribed for non-physician practitioner, Renold Genta, PA-C, working with Deno Etienne, DO, by Stephania Fragmin, ED Scribe. This patient was seen in room WTR8/WTR8 and the patient's care was started at 5:26 PM.    Chief Complaint  Patient presents with  . Shoulder Pain   The history is provided by the patient. No language interpreter was used.    HPI Comments: [name] is a 59 y.o. female with a history of arthritis is her knees and lower back, who presents to the Emergency Department complaining of constant, worsening left shoulder pain that began when she woke up 1 week ago. She denies any known trauma or injury. Patient notes she has had similar pain in the same shoulder the past, but it has never been this severe. Raising her arm exacerbates the pain. Patient has tried applying a heating pad with no relief. She denies any fever or right shoulder pain. Patient is scheduled to see her orthopedist Dr. Ninfa Linden tomorrow for her knees.   Past Medical History  Diagnosis Date  . Hypertension   . COPD (chronic obstructive pulmonary disease)   . Cirrhosis   . Pulmonary emboli 09/2013  . Renal insufficiency   . Kidney stones   . Heart murmur   . DVT (deep venous thrombosis) 09/2013    RLE  . Pneumonia     "4 times in the past year" (10/30/2013)  . History of blood transfusion     "due to excessive blood loss before hysterectomy"  . GERD (gastroesophageal reflux disease)   . Hepatitis B   . Arthritis     "knees; lower back" (10/30/2013)  . Anxiety   . Depression   . Bipolar disorder    Past Surgical History  Procedure Laterality Date  . Carpal tunnel release Left   . Fracture surgery    . Ankle fracture surgery Left   . Abdominal hysterectomy    . Tubal ligation    . Dilation and curettage of uterus  X 2   Family History  Problem Relation Age of Onset  . Stroke Mother   . Lung cancer Father     History  Substance Use Topics  . Smoking status: Former Smoker -- 1.00 packs/day for 41 years    Types: Cigarettes    Quit date: 10/26/2013  . Smokeless tobacco: Never Used  . Alcohol Use: 0.0 oz/week    0 Standard drinks or equivalent per week     Comment: "stopped drinking  in 2004"   OB History    No data available     Review of Systems  Constitutional: Negative for fever.  Musculoskeletal: Positive for arthralgias (left shoulder pain).    Allergies  Cymbalta; Elavil; Heparin; Duloxetine; Gabapentin; and Neomycin-bacitracin zn-polymyx  Home Medications   Prior to Admission medications   Medication Sig Start Date End Date Taking? Authorizing Provider  budesonide-formoterol (SYMBICORT) 160-4.5 MCG/ACT inhaler Inhale 2 puffs into the lungs 2 (two) times daily.     Yes Historical Provider, MD  citalopram (CELEXA) 20 MG tablet Take 20 mg by mouth at bedtime.   Yes Historical Provider, MD  entecavir (BARACLUDE) 0.5 MG tablet Take 0.5 mg by mouth every other day.    Yes Historical Provider, MD  Iloperidone (FANAPT) 6 MG TABS Take 12 mg by mouth every morning.    Yes Historical Provider, MD  pantoprazole (PROTONIX) 40 MG tablet Take  40 mg by mouth daily. 12/23/13  Yes Historical Provider, MD  PROAIR HFA 108 (90 BASE) MCG/ACT inhaler Inhale 2 puffs into the lungs every 6 (six) hours as needed for wheezing or shortness of breath.  07/21/14  Yes Historical Provider, MD  rivaroxaban (XARELTO) 20 MG TABS tablet Take 1 tablet (20 mg total) by mouth daily with supper. 11/02/13  Yes Shanker Kristeen Mans, MD  spironolactone (ALDACTONE) 50 MG tablet Take 50 mg by mouth 2 (two) times daily. 12/16/13  Yes Historical Provider, MD  traMADol (ULTRAM) 50 MG tablet Take 1 tablet (50 mg total) by mouth 2 (two) times daily. Patient taking differently: Take 50 mg by mouth 2 (two) times daily as needed.  11/02/13  Yes Shanker Kristeen Mans, MD   BP 120/76 mmHg  Pulse 62  Temp(Src) 97.5 F (36.4 C) (Oral)   Resp 18  SpO2 98% Physical Exam  Constitutional: She is oriented to person, place, and time. She appears well-developed and well-nourished. No distress.  HENT:  Head: Normocephalic and atraumatic.  Eyes: Conjunctivae and EOM are normal.  Neck: Neck supple. No tracheal deviation present.  Cardiovascular: Normal rate.   Pulmonary/Chest: Effort normal. No respiratory distress.  Musculoskeletal: Normal range of motion.  ttp over left posterior and anterior shoulder. Full rom passively, able to abduct arm to 90deg actively. Bicep strength intact. Negative arm drop test. Pain with internal and external rotation. Distal radial pulses intact. No significant arm swelling.  Neurological: She is alert and oriented to person, place, and time.  Skin: Skin is warm and dry.  Psychiatric: She has a normal mood and affect. Her behavior is normal.  Nursing note and vitals reviewed.   ED Course  Procedures (including critical care time)  DIAGNOSTIC STUDIES: Oxygen Saturation is 98% on RA, normal by my interpretation.    Dg Shoulder Left  08/23/2014   CLINICAL DATA:  Posterior shoulder pain x3 days  EXAM: LEFT SHOULDER - 2+ VIEW  COMPARISON:  None.  FINDINGS: No fracture or dislocation is seen.  Mild degenerative changes of the glenohumeral and acromioclavicular joints.  The visualized soft tissues are unremarkable.  Visualized left lung is clear.  IMPRESSION: No fracture or dislocation is seen.  Mild degenerative changes.   Electronically Signed   By: Julian Hy M.D.   On: 08/23/2014 18:11     COORDINATION OF CARE: 5:28 PM - Discussed treatment plan with pt at bedside which includes XR to evaluate any possible arthritis or spurs in her shoulder, and pt agreed to plan.  MDM   Final diagnoses:  Left shoulder pain   Patient with nontraumatic left shoulder pain. Limited range of motion because of pain. Will get x-ray for evaluation.   6:44 PM The patient's x-ray shows mild degenerative  changes. I will place her in a sling. She is also relative, NSAIDs contraindicated. Will give Norco for severe pain. Patient has follow-up with orthopedics doctor tomorrow and I instructed her to mention her shoulder to them and follow-up with them as needed.  Filed Vitals:   08/23/14 1535  BP: 120/76  Pulse: 62  Temp: 97.5 F (36.4 C)  TempSrc: Oral  Resp: 18  SpO2: 98%    I personally performed the services described in this documentation, which was scribed in my presence. The recorded information has been reviewed and is accurate.   Jeannett Senior, PA-C 08/23/14 [place], DO 08/23/14 2322

## 2014-08-23 NOTE — ED Notes (Signed)
Pt complains of left shoulder pain for the past week. Pt states the pain is worse when she tries to raise her arm. Pt states she does not recall injury, states she woke up with pain.

## 2014-08-23 NOTE — Discharge Instructions (Signed)
Keep sling on for comfort as needed. norco for severe pain only. Follow up with orthopedics specialist as scheduled.   Shoulder Pain The shoulder is the joint that connects your arms to your body. The bones that form the shoulder joint include the upper arm bone (humerus), the shoulder blade (scapula), and the collarbone (clavicle). The top of the humerus is shaped like a ball and fits into a rather flat socket on the scapula (glenoid cavity). A combination of muscles and strong, fibrous tissues that connect muscles to bones (tendons) support your shoulder joint and hold the ball in the socket. Small, fluid-filled sacs (bursae) are located in different areas of the joint. They act as cushions between the bones and the overlying soft tissues and help reduce friction between the gliding tendons and the bone as you move your arm. Your shoulder joint allows a wide range of motion in your arm. This range of motion allows you to do things like scratch your back or throw a ball. However, this range of motion also makes your shoulder more prone to pain from overuse and injury. Causes of shoulder pain can originate from both injury and overuse and usually can be grouped in the following four categories:  Redness, swelling, and pain (inflammation) of the tendon (tendinitis) or the bursae (bursitis).  Instability, such as a dislocation of the joint.  Inflammation of the joint (arthritis).  Broken bone (fracture). HOME CARE INSTRUCTIONS   Apply ice to the sore area.  Put ice in a plastic bag.  Place a towel between your skin and the bag.  Leave the ice on for 15-20 minutes, 3-4 times per day for the first 2 days, or as directed by your health care provider.  Stop using cold packs if they do not help with the pain.  If you have a shoulder sling or immobilizer, wear it as long as your caregiver instructs. Only remove it to shower or bathe. Move your arm as little as possible, but keep your hand moving to  prevent swelling.  Squeeze a soft ball or foam pad as much as possible to help prevent swelling.  Only take over-the-counter or prescription medicines for pain, discomfort, or fever as directed by your caregiver. SEEK MEDICAL CARE IF:   Your shoulder pain increases, or new pain develops in your arm, hand, or fingers.  Your hand or fingers become cold and numb.  Your pain is not relieved with medicines. SEEK IMMEDIATE MEDICAL CARE IF:   Your arm, hand, or fingers are numb or tingling.  Your arm, hand, or fingers are significantly swollen or turn white or blue. MAKE SURE YOU:   Understand these instructions.  Will watch your condition.  Will get help right away if you are not doing well or get worse. Document Released: 10/19/2004 Document Revised: 05/26/2013 Document Reviewed: 12/24/2010 [facility] Patient Information 2015 [place], [place]. This information is not intended to replace advice given to you by your health care provider. Make sure you discuss any questions you have with your health care provider.

## 2014-09-10 ENCOUNTER — Other Ambulatory Visit: Payer: Self-pay | Admitting: Surgery

## 2014-10-07 ENCOUNTER — Ambulatory Visit (INDEPENDENT_AMBULATORY_CARE_PROVIDER_SITE_OTHER): Payer: Medicare Other | Admitting: Pulmonary Disease

## 2014-10-07 ENCOUNTER — Encounter: Payer: Self-pay | Admitting: Pulmonary Disease

## 2014-10-07 ENCOUNTER — Telehealth: Payer: Self-pay | Admitting: Pulmonary Disease

## 2014-10-07 VITALS — BP 106/80 | HR 60 | Ht 64.0 in | Wt 224.0 lb

## 2014-10-07 DIAGNOSIS — J984 Other disorders of lung: Secondary | ICD-10-CM

## 2014-10-07 DIAGNOSIS — J189 Pneumonia, unspecified organism: Secondary | ICD-10-CM | POA: Diagnosis not present

## 2014-10-07 DIAGNOSIS — I2699 Other pulmonary embolism without acute cor pulmonale: Secondary | ICD-10-CM

## 2014-10-07 DIAGNOSIS — J449 Chronic obstructive pulmonary disease, unspecified: Secondary | ICD-10-CM | POA: Diagnosis not present

## 2014-10-07 DIAGNOSIS — J432 Centrilobular emphysema: Secondary | ICD-10-CM | POA: Diagnosis not present

## 2014-10-07 LAB — PULMONARY FUNCTION TEST
DL/VA % pred: 109 %
DL/VA: 5.36 ml/min/mmHg/L
DLCO UNC % PRED: 90 %
DLCO UNC: 22.79 ml/min/mmHg
FEF 25-75 POST: 2.65 L/s
FEF 25-75 PRE: 2.26 L/s
FEF2575-%Change-Post: 17 %
FEF2575-%PRED-POST: 106 %
FEF2575-%Pred-Pre: 90 %
FEV1-%Change-Post: 3 %
FEV1-%Pred-Post: 91 %
FEV1-%Pred-Pre: 88 %
FEV1-Post: 2.45 L
FEV1-Pre: 2.37 L
FEV1FVC-%Change-Post: 4 %
FEV1FVC-%PRED-PRE: 101 %
FEV6-%Change-Post: 0 %
FEV6-%PRED-POST: 88 %
FEV6-%Pred-Pre: 88 %
FEV6-Post: 2.94 L
FEV6-Pre: 2.97 L
FEV6FVC-%PRED-POST: 103 %
FEV6FVC-%Pred-Pre: 103 %
FVC-%Change-Post: 0 %
FVC-%Pred-Post: 85 %
FVC-%Pred-Pre: 85 %
FVC-Post: 2.94 L
FVC-Pre: 2.97 L
PRE FEV1/FVC RATIO: 80 %
Post FEV1/FVC ratio: 83 %
Post FEV6/FVC ratio: 100 %
Pre FEV6/FVC Ratio: 100 %
RV % pred: 119 %
RV: 2.37 L
TLC % pred: 103 %
TLC: 5.34 L

## 2014-10-07 MED ORDER — PROAIR HFA 108 (90 BASE) MCG/ACT IN AERS: 2.0000 | INHALATION_SPRAY | Freq: Four times a day (QID) | RESPIRATORY_TRACT | Status: DC | PRN

## 2014-10-07 NOTE — Assessment & Plan Note (Signed)
Will likely need lifelong anti-coagulation Resume dental work

## 2014-10-07 NOTE — Progress Notes (Signed)
PFT done today. 

## 2014-10-07 NOTE — Telephone Encounter (Signed)
Called and spoke to pt. Pt requesting refill of albuterol hfa. Rx sent to preferred pharmacy. Pt verbalized understanding and denied any further questions or concerns at this time.   

## 2014-10-07 NOTE — Progress Notes (Signed)
   Subjective:    Patient ID: [name], female    DOB: 05-May-1955, 59 y.o.   MRN: [id number]  HPI  59 yo quit smoking oct 2015  For FU of PE   She has h/o cocaine use with etoh cirrhosis & manic depression  - seen by PCCM during recurrent hospitalizations for PNA and RUL cavitary lesion She was dx w/ PE in 09/2013, started on Xarelto.  She was readmitted in Oct  with septic shock, pseudomonal bacteremia and then Nov with cavitary PNA in R[place]    10/07/2014  Chief Complaint  Patient presents with  . Follow-up    Pt here to discuss PFT results. Pt c/o dry cough, wheezing, and sinus congestion.    Quit smoking 54mnths ago.  Off xarelto since august since dental work -she does bruise quite easily Feels like her breathing is doing much better since she quit smoking  Significant tests/ events  CT chest 01/2014 Cystic post infectious scarring in place of consolidation noted earlier in the  apical segment right upper lobe. 11/2013  Quantiferon Gold >> negative, Sputum for AFBneg.   Echo >> EF 60 to 88%, grade 1 diastolic dysfx, mod LA dilation, PAS 32 mmHg 09/2014 > pFTs >> nml    Review of Systems neg for any significant sore throat, dysphagia, itching, sneezing, nasal congestion or excess/ purulent secretions, fever, chills, sweats, unintended wt loss, pleuritic or exertional cp, hempoptysis, orthopnea pnd or change in chronic leg swelling. Also denies presyncope, palpitations, heartburn, abdominal pain, nausea, vomiting, diarrhea or change in bowel or urinary habits, dysuria,hematuria, rash, arthralgias, visual complaints, headache, numbness weakness or ataxia.     Objective:   Physical Exam  Gen. Pleasant, well-nourished, in no distress ENT - no lesions, no post nasal drip Neck: No JVD, no thyromegaly, no carotid bruits Lungs: no use of accessory muscles, no dullness to percussion, clear without rales or rhonchi  Cardiovascular: Rhythm regular, heart sounds  normal, no  murmurs or gallops, no peripheral edema Musculoskeletal: No deformities, no cyanosis or clubbing  Skin - bruising      Assessment & Plan:

## 2014-10-07 NOTE — Patient Instructions (Signed)
Congratulations on quitting smoking ! OK to stop symbicort Refill on pro-air - Use 2 puffs every 6h as needed for wheezing Stay on xarelto

## 2014-10-07 NOTE — Assessment & Plan Note (Signed)
resolved 

## 2014-10-07 NOTE — Assessment & Plan Note (Signed)
Congratulations on quitting smoking ! OK to stop symbicort Refill on pro-air - Use 2 puffs every 6h as needed for wheezing

## 2014-10-08 ENCOUNTER — Telehealth: Payer: Self-pay | Admitting: Pulmonary Disease

## 2014-10-08 MED ORDER — AEROCHAMBER MV MISC: Status: DC

## 2014-10-08 NOTE — Telephone Encounter (Signed)
Spoke with pt, aware that RA is ok with her picking up a spacer.  Paperwork, rx, and spacer placed up front for pt.  Will need demo when she picks this up.  Also RA will need to sign this paper when he returns to office.  This rx will be stapled to [facility] form.  Nothing further needed at this time.

## 2014-10-08 NOTE — Telephone Encounter (Signed)
OK to provide. 

## 2014-10-08 NOTE — Telephone Encounter (Signed)
Called spoke with pt. She reports she was d/c'd from symbicort and has PRN proair. She wants to know if she could use a spacer with this?  Also if so, she would like one. Please advise Dr. Elsworth Soho thanks

## 2014-11-18 ENCOUNTER — Other Ambulatory Visit (HOSPITAL_COMMUNITY): Payer: Self-pay | Admitting: Gastroenterology

## 2014-11-18 DIAGNOSIS — Z1289 Encounter for screening for malignant neoplasm of other sites: Secondary | ICD-10-CM

## 2014-11-18 DIAGNOSIS — B182 Chronic viral hepatitis C: Secondary | ICD-10-CM

## 2014-11-18 DIAGNOSIS — K745 Biliary cirrhosis, unspecified: Secondary | ICD-10-CM

## 2014-11-18 DIAGNOSIS — B181 Chronic viral hepatitis B without delta-agent: Secondary | ICD-10-CM

## 2014-11-25 ENCOUNTER — Telehealth: Payer: Self-pay | Admitting: Pulmonary Disease

## 2014-11-25 ENCOUNTER — Ambulatory Visit: Payer: Self-pay | Admitting: Pulmonary Disease

## 2014-11-25 NOTE — Telephone Encounter (Signed)
Called and spoke with pt. Pt c/o increased SOB, chest tightness with congestion since 11/18/14. She denies any cough, fever, nausea or vomiting, sinus pressure or drainage. Pt was scheduled to see RA on 11/25/14 but clinic was canceled, she states she must be seen soon. I offered an ov with TP on 11/26/14 at [facility] office for 9:00am. She refused stating that she has a Ultrasound at 9:30am that morning. I offered her an ov at the [place] office with TP on 11/26/14 at 2:45. Pt agreed to ov at [facility] office. She voiced understanding and had no further questions. Nothing further needed. Will sign off on message.

## 2014-11-26 ENCOUNTER — Ambulatory Visit: Payer: Self-pay | Admitting: Adult Health

## 2014-11-26 ENCOUNTER — Ambulatory Visit (HOSPITAL_COMMUNITY)
Admission: RE | Admit: 2014-11-26 | Discharge: 2014-11-26 | Disposition: A | Discharge status: Home / Self Care | Payer: Medicare Other | Source: Ambulatory Visit | Attending: Gastroenterology | Admitting: Gastroenterology

## 2014-11-26 DIAGNOSIS — N289 Disorder of kidney and ureter, unspecified: Secondary | ICD-10-CM | POA: Insufficient documentation

## 2014-11-26 DIAGNOSIS — B182 Chronic viral hepatitis C: Secondary | ICD-10-CM | POA: Insufficient documentation

## 2014-11-26 DIAGNOSIS — Z1289 Encounter for screening for malignant neoplasm of other sites: Secondary | ICD-10-CM | POA: Diagnosis not present

## 2014-11-26 DIAGNOSIS — R161 Splenomegaly, not elsewhere classified: Secondary | ICD-10-CM | POA: Insufficient documentation

## 2014-11-26 DIAGNOSIS — K745 Biliary cirrhosis, unspecified: Secondary | ICD-10-CM | POA: Insufficient documentation

## 2014-11-26 DIAGNOSIS — B181 Chronic viral hepatitis B without delta-agent: Secondary | ICD-10-CM | POA: Diagnosis not present

## 2014-11-26 DIAGNOSIS — K7460 Unspecified cirrhosis of liver: Secondary | ICD-10-CM | POA: Diagnosis present

## 2014-11-27 ENCOUNTER — Ambulatory Visit (HOSPITAL_COMMUNITY): Payer: Medicare Other

## 2014-12-01 ENCOUNTER — Inpatient Hospital Stay (HOSPITAL_COMMUNITY): Admission: RE | Admit: 2014-12-01 | Payer: Self-pay | Source: Ambulatory Visit

## 2014-12-04 ENCOUNTER — Ambulatory Visit (HOSPITAL_COMMUNITY)
Admission: RE | Admit: 2014-12-04 | Discharge: 2014-12-04 | Disposition: A | Discharge status: Home / Self Care | Payer: Medicare Other | Source: Ambulatory Visit | Attending: Anesthesiology | Admitting: Anesthesiology

## 2014-12-04 ENCOUNTER — Encounter (HOSPITAL_COMMUNITY)
Admission: RE | Admit: 2014-12-04 | Discharge: 2014-12-04 | Disposition: A | Discharge status: Home / Self Care | Payer: Medicare Other | Source: Ambulatory Visit | Attending: Surgery | Admitting: Surgery

## 2014-12-04 ENCOUNTER — Encounter (HOSPITAL_COMMUNITY): Payer: Self-pay

## 2014-12-04 DIAGNOSIS — Z01812 Encounter for preprocedural laboratory examination: Secondary | ICD-10-CM | POA: Diagnosis not present

## 2014-12-04 DIAGNOSIS — B9561 Methicillin susceptible Staphylococcus aureus infection as the cause of diseases classified elsewhere: Secondary | ICD-10-CM | POA: Insufficient documentation

## 2014-12-04 DIAGNOSIS — Z01811 Encounter for preprocedural respiratory examination: Secondary | ICD-10-CM | POA: Diagnosis not present

## 2014-12-04 DIAGNOSIS — R9389 Abnormal findings on diagnostic imaging of other specified body structures: Secondary | ICD-10-CM

## 2014-12-04 DIAGNOSIS — K469 Unspecified abdominal hernia without obstruction or gangrene: Secondary | ICD-10-CM | POA: Diagnosis not present

## 2014-12-04 DIAGNOSIS — R938 Abnormal findings on diagnostic imaging of other specified body structures: Secondary | ICD-10-CM | POA: Diagnosis present

## 2014-12-04 HISTORY — DX: Agoraphobia with panic disorder: F40.01

## 2014-12-04 HISTORY — DX: Unspecified urinary incontinence: R32

## 2014-12-04 HISTORY — DX: Reserved for inherently not codable concepts without codable children: IMO0001

## 2014-12-04 HISTORY — DX: Frequency of micturition: R35.0

## 2014-12-04 HISTORY — DX: Personal history of urinary (tract) infections: Z87.440

## 2014-12-04 LAB — COMPREHENSIVE METABOLIC PANEL
ALK PHOS: 91 U/L (ref 38–126)
ALT: 16 U/L (ref 14–54)
AST: 20 U/L (ref 15–41)
Albumin: 4.2 g/dL (ref 3.5–5.0)
Anion gap: 8 (ref 5–15)
BUN: 22 mg/dL — AB (ref 6–20)
CALCIUM: 9.2 mg/dL (ref 8.9–10.3)
CHLORIDE: 109 mmol/L (ref 101–111)
CO2: 24 mmol/L (ref 22–32)
CREATININE: 1.28 mg/dL — AB (ref 0.44–1.00)
GFR calc non Af Amer: 45 mL/min — ABNORMAL LOW (ref >60)
GFR, EST AFRICAN AMERICAN: 53 mL/min — AB (ref >60)
Glucose, Bld: 93 mg/dL (ref 65–99)
Potassium: 3.9 mmol/L (ref 3.5–5.1)
SODIUM: 141 mmol/L (ref 135–145)
Total Bilirubin: 0.5 mg/dL (ref 0.3–1.2)
Total Protein: 7 g/dL (ref 6.5–8.1)

## 2014-12-04 LAB — CBC WITH DIFFERENTIAL/PLATELET
Basophils Absolute: 0 10*3/uL (ref 0.0–0.1)
Basophils Relative: 0 %
Eosinophils Absolute: 0 10*3/uL (ref 0.0–0.7)
Eosinophils Relative: 1 %
HCT: 38 % (ref 36.0–46.0)
Hemoglobin: 12.7 g/dL (ref 12.0–15.0)
Lymphocytes Relative: 33 %
Lymphs Abs: 1.8 10*3/uL (ref 0.7–4.0)
MCH: 30.5 pg (ref 26.0–34.0)
MCHC: 33.4 g/dL (ref 30.0–36.0)
MCV: 91.1 fL (ref 78.0–100.0)
Monocytes Absolute: 0.2 10*3/uL (ref 0.1–1.0)
Monocytes Relative: 4 %
Neutro Abs: 3.3 10*3/uL (ref 1.7–7.7)
Neutrophils Relative %: 62 %
Platelets: 130 10*3/uL — ABNORMAL LOW (ref 150–400)
RBC: 4.17 MIL/uL (ref 3.87–5.11)
RDW: 13.2 % (ref 11.5–15.5)
WBC: 5.4 10*3/uL (ref 4.0–10.5)

## 2014-12-04 LAB — SURGICAL PCR SCREEN
MRSA, PCR: NEGATIVE
Staphylococcus aureus: POSITIVE — AB

## 2014-12-04 LAB — PROTIME-INR
INR: 1.12 (ref 0.00–1.49)
Prothrombin Time: 14.6 seconds (ref 11.6–15.2)

## 2014-12-04 NOTE — Progress Notes (Addendum)
Surgical screening results per epic per PAT visit 12/04/2014 positive for STAPH. Results sent to Dr Johney [place]. Prescription for Mupriocin Ointment called to [facility]; spoke with Alan-pharmacist. Pt is aware.

## 2014-12-04 NOTE — Progress Notes (Signed)
CBCD and CMP results in epic per PAT visit 12/04/2014 sent to Dr Johney [place].

## 2014-12-04 NOTE — Progress Notes (Signed)
EKG per epic 04/03/2014 ECHO per epic 12/10/2013

## 2014-12-04 NOTE — Progress Notes (Signed)
Dr Delma Post / anesthesia reviewed pts H&P; ECHO results (epic and on chart) 12/10/2013; EKGs (epic and on chart) from 04/03/2014, 02/07/2014 and 12/08/2013. No orders given. Anesthesia to see pt day of surgery.

## 2014-12-04 NOTE — Progress Notes (Signed)
Your patient has screened at an elevated risk for Obstructive Sleep Apnea using the Stop-Bang Tool during a pre-surgical visit. Pt scored at high risk.  

## 2014-12-04 NOTE — Patient Instructions (Signed)
[name]  12/04/2014   Your procedure is scheduled on: Thursday December 10, 2014   Report to [name] Main  Entrance take [place]  elevators to 3rd floor to  [place] at 5:00 AM.  Call this number if you have problems the morning of surgery [phone]   Remember: ONLY 1 PERSON MAY GO WITH YOU TO SHORT STAY TO GET  READY MORNING OF [name].  Do not eat food or drink liquids :After Midnight.     Take these medicines the morning of surgery with A SIP OF WATER: Alprazolam (Xanax) if needed; Baraclude; Pantoprazole (Protonix); Iloperidone (Fanapt); May use proair inhaler if needed              You may not have any metal on your body including hair pins and              piercings  Do not wear jewelry, make-up, lotions, powders or perfumes, deodorant             Do not wear nail polish.  Do not shave  48 hours prior to surgery.               Do not bring valuables to the hospital. [name].  Contacts, dentures or bridgework may not be worn into surgery.     Patients discharged the day of surgery will not be allowed to drive home.  Name and phone number of your driver:[name] (sister)   _____________________________________________________________________             [facility] - Preparing for Surgery Before surgery, you can play an important role.  Because skin is not sterile, your skin needs to be as free of germs as possible.  You can reduce the number of germs on your skin by washing with CHG (chlorahexidine gluconate) soap before surgery.  CHG is an antiseptic cleaner which kills germs and bonds with the skin to continue killing germs even after washing. Please DO NOT use if you have an allergy to CHG or antibacterial soaps.  If your skin becomes reddened/irritated stop using the CHG and inform your nurse when you arrive at Short Stay. Do not shave (including legs and underarms) for at  least 48 hours prior to the first CHG shower.  You may shave your face/neck. Please follow these instructions carefully:  1.  Shower with CHG Soap the night before surgery and the  morning of Surgery.  2.  If you choose to wash your hair, wash your hair first as usual with your  normal  shampoo.  3.  After you shampoo, rinse your hair and body thoroughly to remove the  shampoo.                           4.  Use CHG as you would any other liquid soap.  You can apply chg directly  to the skin and wash                       Gently with a scrungie or clean washcloth.  5.  Apply the CHG Soap to your body ONLY FROM THE NECK DOWN.   Do not use on face/ open  Wound or open sores. Avoid contact with eyes, ears mouth and genitals (private parts).                       Wash face,  Genitals (private parts) with your normal soap.             6.  Wash thoroughly, paying special attention to the area where your surgery  will be performed.  7.  Thoroughly rinse your body with warm water from the neck down.  8.  DO NOT shower/wash with your normal soap after using and rinsing off  the CHG Soap.                9.  Pat yourself dry with a clean towel.            10.  Wear clean pajamas.            11.  Place clean sheets on your bed the night of your first shower and do not  sleep with pets. Day of Surgery : Do not apply any lotions/deodorants the morning of surgery.  Please wear clean clothes to the hospital/surgery [facility].  FAILURE TO FOLLOW THESE INSTRUCTIONS MAY RESULT IN THE CANCELLATION OF YOUR SURGERY PATIENT SIGNATURE_________________________________  NURSE SIGNATURE__________________________________  ________________________________________________________________________

## 2014-12-10 ENCOUNTER — Ambulatory Visit (HOSPITAL_COMMUNITY): Payer: Medicare Other | Admitting: Anesthesiology

## 2014-12-10 ENCOUNTER — Encounter (HOSPITAL_COMMUNITY)
Admission: RE | Disposition: A | Discharge status: Home / Self Care | Payer: Self-pay | Source: Ambulatory Visit | Attending: Surgery

## 2014-12-10 ENCOUNTER — Encounter (HOSPITAL_COMMUNITY): Payer: Self-pay | Admitting: *Deleted

## 2014-12-10 ENCOUNTER — Ambulatory Visit (HOSPITAL_COMMUNITY)
Admission: RE | Admit: 2014-12-10 | Discharge: 2014-12-11 | Disposition: A | Discharge status: Home / Self Care | Payer: Medicare Other | Source: Ambulatory Visit | Attending: Surgery | Admitting: Surgery

## 2014-12-10 DIAGNOSIS — I10 Essential (primary) hypertension: Secondary | ICD-10-CM | POA: Diagnosis present

## 2014-12-10 DIAGNOSIS — K219 Gastro-esophageal reflux disease without esophagitis: Secondary | ICD-10-CM | POA: Diagnosis not present

## 2014-12-10 DIAGNOSIS — K7460 Unspecified cirrhosis of liver: Secondary | ICD-10-CM | POA: Diagnosis not present

## 2014-12-10 DIAGNOSIS — Z6837 Body mass index (BMI) 37.0-37.9, adult: Secondary | ICD-10-CM | POA: Diagnosis not present

## 2014-12-10 DIAGNOSIS — J449 Chronic obstructive pulmonary disease, unspecified: Secondary | ICD-10-CM | POA: Diagnosis not present

## 2014-12-10 DIAGNOSIS — F419 Anxiety disorder, unspecified: Secondary | ICD-10-CM | POA: Insufficient documentation

## 2014-12-10 DIAGNOSIS — K432 Incisional hernia without obstruction or gangrene: Secondary | ICD-10-CM

## 2014-12-10 DIAGNOSIS — Z87891 Personal history of nicotine dependence: Secondary | ICD-10-CM | POA: Diagnosis not present

## 2014-12-10 DIAGNOSIS — N183 Chronic kidney disease, stage 3 unspecified: Secondary | ICD-10-CM | POA: Diagnosis present

## 2014-12-10 DIAGNOSIS — B1710 Acute hepatitis C without hepatic coma: Secondary | ICD-10-CM | POA: Insufficient documentation

## 2014-12-10 DIAGNOSIS — I129 Hypertensive chronic kidney disease with stage 1 through stage 4 chronic kidney disease, or unspecified chronic kidney disease: Secondary | ICD-10-CM | POA: Insufficient documentation

## 2014-12-10 DIAGNOSIS — Z79899 Other long term (current) drug therapy: Secondary | ICD-10-CM | POA: Insufficient documentation

## 2014-12-10 DIAGNOSIS — I2510 Atherosclerotic heart disease of native coronary artery without angina pectoris: Secondary | ICD-10-CM | POA: Insufficient documentation

## 2014-12-10 DIAGNOSIS — E669 Obesity, unspecified: Secondary | ICD-10-CM | POA: Diagnosis present

## 2014-12-10 DIAGNOSIS — Z7951 Long term (current) use of inhaled steroids: Secondary | ICD-10-CM | POA: Diagnosis not present

## 2014-12-10 DIAGNOSIS — F411 Generalized anxiety disorder: Secondary | ICD-10-CM | POA: Diagnosis present

## 2014-12-10 DIAGNOSIS — Z791 Long term (current) use of non-steroidal anti-inflammatories (NSAID): Secondary | ICD-10-CM | POA: Diagnosis not present

## 2014-12-10 DIAGNOSIS — M1990 Unspecified osteoarthritis, unspecified site: Secondary | ICD-10-CM | POA: Insufficient documentation

## 2014-12-10 DIAGNOSIS — N1832 Chronic kidney disease, stage 3b: Secondary | ICD-10-CM | POA: Diagnosis present

## 2014-12-10 DIAGNOSIS — K420 Umbilical hernia with obstruction, without gangrene: Secondary | ICD-10-CM | POA: Diagnosis present

## 2014-12-10 DIAGNOSIS — K430 Incisional hernia with obstruction, without gangrene: Secondary | ICD-10-CM | POA: Diagnosis present

## 2014-12-10 HISTORY — DX: Altered mental status, unspecified: R41.82

## 2014-12-10 HISTORY — PX: INSERTION OF MESH: SHX5868

## 2014-12-10 HISTORY — PX: LAPAROSCOPIC ASSISTED VENTRAL HERNIA REPAIR: SHX6312

## 2014-12-10 HISTORY — DX: Other fracture of left lower leg, initial encounter for closed fracture: S82.892A

## 2014-12-10 HISTORY — DX: Pneumonia, unspecified organism: J18.9

## 2014-12-10 HISTORY — DX: Other disorders of lung: J98.4

## 2014-12-10 SURGERY — REPAIR, HERNIA, VENTRAL, LAPAROSCOPY-ASSISTED
Anesthesia: General | Site: Abdomen

## 2014-12-10 MED ORDER — SODIUM CHLORIDE 0.9 % IV SOLN
8.0000 mg | Freq: Four times a day (QID) | INTRAVENOUS | Status: DC | PRN
  Filled 2014-12-10: qty 4

## 2014-12-10 MED ORDER — HYDROMORPHONE HCL 1 MG/ML IJ SOLN
INTRAMUSCULAR | Status: AC
  Filled 2014-12-10: qty 1

## 2014-12-10 MED ORDER — ALPRAZOLAM 1 MG PO TABS
1.0000 mg | ORAL_TABLET | Freq: Three times a day (TID) | ORAL | Status: DC | PRN
  Administered 2014-12-10 – 2014-12-11 (×3): 1 mg via ORAL
  Filled 2014-12-10 (×3): qty 1

## 2014-12-10 MED ORDER — ALBUTEROL SULFATE HFA 108 (90 BASE) MCG/ACT IN AERS: 2.0000 | INHALATION_SPRAY | Freq: Four times a day (QID) | RESPIRATORY_TRACT | Status: DC | PRN

## 2014-12-10 MED ORDER — ROCURONIUM BROMIDE 100 MG/10ML IV SOLN
INTRAVENOUS | Status: DC | PRN
  Administered 2014-12-10 (×4): 5 mg via INTRAVENOUS
  Administered 2014-12-10: 10 mg via INTRAVENOUS
  Administered 2014-12-10: 40 mg via INTRAVENOUS

## 2014-12-10 MED ORDER — CEFAZOLIN SODIUM-DEXTROSE 2-3 GM-% IV SOLR
INTRAVENOUS | Status: AC
  Filled 2014-12-10: qty 50

## 2014-12-10 MED ORDER — PANTOPRAZOLE SODIUM 40 MG PO TBEC
40.0000 mg | DELAYED_RELEASE_TABLET | Freq: Every day | ORAL | Status: DC
  Filled 2014-12-10: qty 1

## 2014-12-10 MED ORDER — GLYCOPYRROLATE 0.2 MG/ML IJ SOLN
INTRAMUSCULAR | Status: DC | PRN
  Administered 2014-12-10: 0.2 mg via INTRAVENOUS

## 2014-12-10 MED ORDER — BUPIVACAINE-EPINEPHRINE 0.25% -1:200000 IJ SOLN
INTRAMUSCULAR | Status: DC | PRN
  Administered 2014-12-10: 100 mL

## 2014-12-10 MED ORDER — ONDANSETRON HCL 4 MG/2ML IJ SOLN
INTRAMUSCULAR | Status: DC | PRN
  Administered 2014-12-10: 4 mg via INTRAVENOUS

## 2014-12-10 MED ORDER — SODIUM CHLORIDE 0.9 % IJ SOLN
INTRAMUSCULAR | Status: DC | PRN
  Administered 2014-12-10: 80 mL

## 2014-12-10 MED ORDER — HYDROMORPHONE HCL 1 MG/ML IJ SOLN
0.5000 mg | INTRAMUSCULAR | Status: DC | PRN
  Administered 2014-12-10: 1 mg via INTRAVENOUS
  Administered 2014-12-10: 0.5 mg via INTRAVENOUS
  Administered 2014-12-10: 2 mg via INTRAVENOUS
  Administered 2014-12-10 – 2014-12-11 (×2): 1 mg via INTRAVENOUS
  Filled 2014-12-10 (×3): qty 1
  Filled 2014-12-10: qty 2

## 2014-12-10 MED ORDER — PROPOFOL 10 MG/ML IV BOLUS
INTRAVENOUS | Status: DC | PRN
  Administered 2014-12-10: 150 mg via INTRAVENOUS

## 2014-12-10 MED ORDER — TEMAZEPAM 15 MG PO CAPS
30.0000 mg | ORAL_CAPSULE | Freq: Every day | ORAL | Status: DC
  Administered 2014-12-10: 30 mg via ORAL
  Filled 2014-12-10: qty 2

## 2014-12-10 MED ORDER — PROPOFOL 10 MG/ML IV BOLUS
INTRAVENOUS | Status: AC
  Filled 2014-12-10: qty 20

## 2014-12-10 MED ORDER — SUGAMMADEX SODIUM 200 MG/2ML IV SOLN
INTRAVENOUS | Status: DC | PRN
Start: 2014-12-10 — End: 2014-12-10
  Administered 2014-12-10: 200 mg via INTRAVENOUS

## 2014-12-10 MED ORDER — LACTATED RINGERS IV BOLUS (SEPSIS): 1000.0000 mL | Freq: Three times a day (TID) | INTRAVENOUS | Status: DC | PRN

## 2014-12-10 MED ORDER — METOPROLOL TARTRATE 1 MG/ML IV SOLN
5.0000 mg | Freq: Four times a day (QID) | INTRAVENOUS | Status: DC | PRN
  Filled 2014-12-10: qty 5

## 2014-12-10 MED ORDER — CITALOPRAM HYDROBROMIDE 20 MG PO TABS
20.0000 mg | ORAL_TABLET | Freq: Every day | ORAL | Status: DC
  Administered 2014-12-10: 20 mg via ORAL
  Filled 2014-12-10 (×2): qty 1

## 2014-12-10 MED ORDER — HYDROMORPHONE HCL 1 MG/ML IJ SOLN: 0.5000 mg | INTRAMUSCULAR | Status: DC | PRN

## 2014-12-10 MED ORDER — HYDROMORPHONE HCL 1 MG/ML IJ SOLN: 0.2500 mg | INTRAMUSCULAR | Status: DC | PRN

## 2014-12-10 MED ORDER — SUCCINYLCHOLINE CHLORIDE 20 MG/ML IJ SOLN
INTRAMUSCULAR | Status: DC | PRN
  Administered 2014-12-10: 100 mg via INTRAVENOUS

## 2014-12-10 MED ORDER — LACTULOSE 10 GM/15ML PO SOLN
20.0000 g | Freq: Two times a day (BID) | ORAL | Status: DC | PRN
  Filled 2014-12-10: qty 30

## 2014-12-10 MED ORDER — ILOPERIDONE 6 MG PO TABS: 12.0000 mg | ORAL_TABLET | Freq: Every morning | ORAL | Status: DC

## 2014-12-10 MED ORDER — SODIUM CHLORIDE 0.9 % IJ SOLN
INTRAMUSCULAR | Status: AC
  Filled 2014-12-10: qty 10

## 2014-12-10 MED ORDER — MAGIC MOUTHWASH
15.0000 mL | Freq: Four times a day (QID) | ORAL | Status: DC | PRN
  Filled 2014-12-10: qty 15

## 2014-12-10 MED ORDER — ONDANSETRON 4 MG PO TBDP: 4.0000 mg | ORAL_TABLET | Freq: Four times a day (QID) | ORAL | Status: DC | PRN

## 2014-12-10 MED ORDER — PROMETHAZINE HCL 25 MG/ML IJ SOLN: 6.2500 mg | INTRAMUSCULAR | Status: DC | PRN

## 2014-12-10 MED ORDER — PHENOL 1.4 % MT LIQD
2.0000 | OROMUCOSAL | Status: DC | PRN
  Filled 2014-12-10: qty 177

## 2014-12-10 MED ORDER — LIDOCAINE HCL (CARDIAC) 20 MG/ML IV SOLN
INTRAVENOUS | Status: AC
  Filled 2014-12-10: qty 5

## 2014-12-10 MED ORDER — DIPHENHYDRAMINE HCL 50 MG/ML IJ SOLN: 12.5000 mg | Freq: Four times a day (QID) | INTRAMUSCULAR | Status: DC | PRN

## 2014-12-10 MED ORDER — SODIUM CHLORIDE 0.9 % IJ SOLN
INTRAMUSCULAR | Status: AC
  Filled 2014-12-10: qty 100

## 2014-12-10 MED ORDER — STERILE WATER FOR IRRIGATION IR SOLN
Status: DC | PRN
  Administered 2014-12-10: 1000 mL

## 2014-12-10 MED ORDER — MIDAZOLAM HCL 2 MG/2ML IJ SOLN
INTRAMUSCULAR | Status: AC
  Filled 2014-12-10: qty 2

## 2014-12-10 MED ORDER — ALUM & MAG HYDROXIDE-SIMETH 200-200-20 MG/5ML PO SUSP
30.0000 mL | Freq: Four times a day (QID) | ORAL | Status: DC | PRN
  Administered 2014-12-10: 30 mL via ORAL
  Filled 2014-12-10: qty 30

## 2014-12-10 MED ORDER — SODIUM CHLORIDE 0.9 % IJ SOLN: 3.0000 mL | INTRAMUSCULAR | Status: DC | PRN

## 2014-12-10 MED ORDER — EPHEDRINE SULFATE 50 MG/ML IJ SOLN
INTRAMUSCULAR | Status: AC
  Filled 2014-12-10: qty 1

## 2014-12-10 MED ORDER — ALBUTEROL SULFATE (2.5 MG/3ML) 0.083% IN NEBU: 2.5000 mg | INHALATION_SOLUTION | Freq: Four times a day (QID) | RESPIRATORY_TRACT | Status: DC | PRN

## 2014-12-10 MED ORDER — GLYCOPYRROLATE 0.2 MG/ML IJ SOLN
INTRAMUSCULAR | Status: AC
  Filled 2014-12-10: qty 1

## 2014-12-10 MED ORDER — ROCURONIUM BROMIDE 100 MG/10ML IV SOLN
INTRAVENOUS | Status: AC
  Filled 2014-12-10: qty 1

## 2014-12-10 MED ORDER — ONDANSETRON HCL 4 MG/2ML IJ SOLN: 4.0000 mg | Freq: Four times a day (QID) | INTRAMUSCULAR | Status: DC | PRN

## 2014-12-10 MED ORDER — LIP MEDEX EX OINT
1.0000 "application " | TOPICAL_OINTMENT | Freq: Two times a day (BID) | CUTANEOUS | Status: DC
  Administered 2014-12-10: 1 via TOPICAL
  Filled 2014-12-10: qty 7

## 2014-12-10 MED ORDER — LACTATED RINGERS IV SOLN: INTRAVENOUS | Status: DC

## 2014-12-10 MED ORDER — BUPIVACAINE LIPOSOME 1.3 % IJ SUSP
20.0000 mL | Freq: Once | INTRAMUSCULAR | Status: AC
  Administered 2014-12-10: 20 mL
  Filled 2014-12-10: qty 20

## 2014-12-10 MED ORDER — ETODOLAC 400 MG PO TABS
400.0000 mg | ORAL_TABLET | Freq: Two times a day (BID) | ORAL | Status: DC | PRN
  Filled 2014-12-10: qty 1

## 2014-12-10 MED ORDER — DEXAMETHASONE SODIUM PHOSPHATE 10 MG/ML IJ SOLN
INTRAMUSCULAR | Status: AC
Start: 2014-12-10 — End: 2014-12-10
  Filled 2014-12-10: qty 1

## 2014-12-10 MED ORDER — SUGAMMADEX SODIUM 200 MG/2ML IV SOLN
INTRAVENOUS | Status: AC
  Filled 2014-12-10: qty 2

## 2014-12-10 MED ORDER — SODIUM CHLORIDE 0.9 % IJ SOLN
3.0000 mL | Freq: Two times a day (BID) | INTRAMUSCULAR | Status: DC
  Administered 2014-12-10: 3 mL via INTRAVENOUS

## 2014-12-10 MED ORDER — ONDANSETRON HCL 4 MG/2ML IJ SOLN
INTRAMUSCULAR | Status: AC
  Filled 2014-12-10: qty 2

## 2014-12-10 MED ORDER — DEXAMETHASONE SODIUM PHOSPHATE 10 MG/ML IJ SOLN
INTRAMUSCULAR | Status: DC | PRN
  Administered 2014-12-10: 10 mg via INTRAVENOUS

## 2014-12-10 MED ORDER — ENTECAVIR 0.5 MG PO TABS: 0.5000 mg | ORAL_TABLET | ORAL | Status: DC

## 2014-12-10 MED ORDER — LACTATED RINGERS IV SOLN
INTRAVENOUS | Status: DC | PRN
  Administered 2014-12-10: 07:00:00 via INTRAVENOUS

## 2014-12-10 MED ORDER — LIDOCAINE HCL (CARDIAC) 20 MG/ML IV SOLN
INTRAVENOUS | Status: DC | PRN
  Administered 2014-12-10: 100 mg via INTRAVENOUS

## 2014-12-10 MED ORDER — TRAMADOL HCL 50 MG PO TABS: 50.0000 mg | ORAL_TABLET | Freq: Four times a day (QID) | ORAL | Status: DC | PRN

## 2014-12-10 MED ORDER — POLYETHYLENE GLYCOL 3350 17 G PO PACK: 17.0000 g | PACK | Freq: Two times a day (BID) | ORAL | Status: DC

## 2014-12-10 MED ORDER — FENTANYL CITRATE (PF) 250 MCG/5ML IJ SOLN
INTRAMUSCULAR | Status: AC
  Filled 2014-12-10: qty 5

## 2014-12-10 MED ORDER — TRAMADOL HCL 50 MG PO TABS
50.0000 mg | ORAL_TABLET | Freq: Four times a day (QID) | ORAL | Status: DC | PRN
  Administered 2014-12-10 – 2014-12-11 (×3): 100 mg via ORAL
  Filled 2014-12-10 (×3): qty 2

## 2014-12-10 MED ORDER — TRAMADOL HCL 50 MG PO TABS: 50.0000 mg | ORAL_TABLET | Freq: Two times a day (BID) | ORAL | Status: DC

## 2014-12-10 MED ORDER — MENTHOL 3 MG MT LOZG: 1.0000 | LOZENGE | OROMUCOSAL | Status: DC | PRN

## 2014-12-10 MED ORDER — RIVAROXABAN 20 MG PO TABS: 20.0000 mg | ORAL_TABLET | Freq: Every day | ORAL | Status: DC

## 2014-12-10 MED ORDER — MIDAZOLAM HCL 5 MG/5ML IJ SOLN
INTRAMUSCULAR | Status: DC | PRN
  Administered 2014-12-10 (×2): 1 mg via INTRAVENOUS

## 2014-12-10 MED ORDER — LACTATED RINGERS IV SOLN
INTRAVENOUS | Status: AC
  Administered 2014-12-10: 11:00:00 via INTRAVENOUS

## 2014-12-10 MED ORDER — 0.9 % SODIUM CHLORIDE (POUR BTL) OPTIME
TOPICAL | Status: DC | PRN
  Administered 2014-12-10: 1000 mL

## 2014-12-10 MED ORDER — SODIUM CHLORIDE 0.9 % IV SOLN: 250.0000 mL | INTRAVENOUS | Status: DC | PRN

## 2014-12-10 MED ORDER — BUPIVACAINE-EPINEPHRINE 0.25% -1:200000 IJ SOLN
INTRAMUSCULAR | Status: AC
  Filled 2014-12-10: qty 2

## 2014-12-10 MED ORDER — IBUPROFEN 200 MG PO TABS: 400.0000 mg | ORAL_TABLET | Freq: Four times a day (QID) | ORAL | Status: DC | PRN

## 2014-12-10 MED ORDER — ACETAMINOPHEN 500 MG PO TABS: 500.0000 mg | ORAL_TABLET | Freq: Four times a day (QID) | ORAL | Status: DC | PRN

## 2014-12-10 MED ORDER — SPIRONOLACTONE 50 MG PO TABS
50.0000 mg | ORAL_TABLET | Freq: Two times a day (BID) | ORAL | Status: DC
  Administered 2014-12-10: 50 mg via ORAL
  Filled 2014-12-10 (×4): qty 1

## 2014-12-10 MED ORDER — CEFAZOLIN SODIUM-DEXTROSE 2-3 GM-% IV SOLR
2.0000 g | INTRAVENOUS | Status: AC
  Administered 2014-12-10: 2 g via INTRAVENOUS

## 2014-12-10 MED ORDER — FENTANYL CITRATE (PF) 100 MCG/2ML IJ SOLN
INTRAMUSCULAR | Status: DC | PRN
  Administered 2014-12-10 (×5): 50 ug via INTRAVENOUS

## 2014-12-10 MED ORDER — EPHEDRINE SULFATE 50 MG/ML IJ SOLN
INTRAMUSCULAR | Status: DC | PRN
  Administered 2014-12-10 (×2): 10 mg via INTRAVENOUS

## 2014-12-10 SURGICAL SUPPLY — 42 items
APPLIER CLIP 5 13 M/L LIGAMAX5 (MISCELLANEOUS)
APR CLP MED LRG 5 ANG JAW (MISCELLANEOUS)
BINDER ABDOMINAL 12 ML 46-62 (SOFTGOODS) ×4 IMPLANT
CABLE HIGH FREQUENCY MONO STRZ (ELECTRODE) ×4 IMPLANT
CATH KIT ON-Q SILVERSOAK 7.5IN (CATHETERS) IMPLANT
CHLORAPREP W/TINT 26ML (MISCELLANEOUS) ×4 IMPLANT
CLIP APPLIE 5 13 M/L LIGAMAX5 (MISCELLANEOUS) IMPLANT
CLOSURE WOUND 1/2 X4 (GAUZE/BANDAGES/DRESSINGS) ×2
COVER SURGICAL LIGHT HANDLE (MISCELLANEOUS) ×4 IMPLANT
DECANTER SPIKE VIAL GLASS SM (MISCELLANEOUS) ×4 IMPLANT
DEVICE SECURE STRAP 25 ABSORB (INSTRUMENTS) ×4 IMPLANT
DEVICE TROCAR PUNCTURE CLOSURE (ENDOMECHANICALS) ×4 IMPLANT
DRAPE LAPAROSCOPIC ABDOMINAL (DRAPES) ×4 IMPLANT
DRAPE WARM FLUID 44X44 (DRAPE) ×4 IMPLANT
DRSG TEGADERM 2-3/8X2-3/4 SM (GAUZE/BANDAGES/DRESSINGS) ×12 IMPLANT
ELECT REM PT RETURN 9FT ADLT (ELECTROSURGICAL) ×4
ELECTRODE REM PT RTRN 9FT ADLT (ELECTROSURGICAL) ×2 IMPLANT
GAUZE SPONGE 2X2 8PLY STRL LF (GAUZE/BANDAGES/DRESSINGS) ×2 IMPLANT
GLOVE ECLIPSE 8.0 STRL XLNG CF (GLOVE) ×4 IMPLANT
GLOVE INDICATOR 8.0 STRL GRN (GLOVE) ×4 IMPLANT
GOWN STRL REUS W/TWL XL LVL3 (GOWN DISPOSABLE) ×8 IMPLANT
KIT BASIN OR (CUSTOM PROCEDURE TRAY) ×4 IMPLANT
MESH VENTRALIGHT ST 6X8 (Mesh Specialty) ×2 IMPLANT
MESH VENTRLGHT ELLIPSE 8X6XMFL (Mesh Specialty) ×2 IMPLANT
NEEDLE SPNL 22GX3.5 QUINCKE BK (NEEDLE) ×4 IMPLANT
PEN SKIN MARKING BROAD (MISCELLANEOUS) ×4 IMPLANT
SCISSORS LAP 5X35 DISP (ENDOMECHANICALS) ×4 IMPLANT
SET IRRIG TUBING LAPAROSCOPIC (IRRIGATION / IRRIGATOR) IMPLANT
SHEARS HARMONIC ACE PLUS 36CM (ENDOMECHANICALS) IMPLANT
SLEEVE XCEL OPT CAN 5 100 (ENDOMECHANICALS) ×8 IMPLANT
SPONGE GAUZE 2X2 STER 10/PKG (GAUZE/BANDAGES/DRESSINGS) ×2
STRIP CLOSURE SKIN 1/2X4 (GAUZE/BANDAGES/DRESSINGS) ×6 IMPLANT
SUT MNCRL AB 4-0 PS2 18 (SUTURE) ×4 IMPLANT
SUT PDS AB 1 CT1 27 (SUTURE) ×16 IMPLANT
SUT PROLENE 1 CT 1 30 (SUTURE) ×28 IMPLANT
TOWEL OR 17X26 10 PK STRL BLUE (TOWEL DISPOSABLE) ×4 IMPLANT
TRAY FOLEY W/METER SILVER 14FR (SET/KITS/TRAYS/PACK) IMPLANT
TRAY FOLEY W/METER SILVER 16FR (SET/KITS/TRAYS/PACK) IMPLANT
TRAY LAPAROSCOPIC (CUSTOM PROCEDURE TRAY) ×4 IMPLANT
TROCAR BLADELESS OPT 5 100 (ENDOMECHANICALS) ×4 IMPLANT
TROCAR XCEL NON-BLD 11X100MML (ENDOMECHANICALS) IMPLANT
TUNNELER SHEATH ON-Q 16GX12 DP (PAIN MANAGEMENT) IMPLANT

## 2014-12-10 NOTE — Anesthesia Postprocedure Evaluation (Signed)
  Anesthesia Post-op Note  Patient: [name]  Procedure(s) Performed: Procedure(s): LAPAROSCOPIC ASSISTED REPAIR OF INCARCERATED UMBILICAL HERNIA  (N/A) INSERTION OF MESH (N/A)  Patient Location: PACU  Anesthesia Type:General  Level of Consciousness: awake  Airway and Oxygen Therapy: Patient Spontanous Breathing  Post-op Pain: mild  Post-op Assessment: Post-op Vital signs reviewed              Post-op Vital Signs: Reviewed  Last Vitals:  Filed Vitals:   12/10/14 1402  BP: 108/59  Pulse: 58  Temp: 36.6 C  Resp: 20    Complications: No apparent anesthesia complications

## 2014-12-10 NOTE — Transfer of Care (Signed)
Immediate Anesthesia Transfer of Care Note  Patient: [name]  Procedure(s) Performed: Procedure(s): LAPAROSCOPIC ASSISTED REPAIR OF INCARCERATED UMBILICAL HERNIA  (N/A) INSERTION OF MESH (N/A)  Patient Location: PACU  Anesthesia Type:General  Level of Consciousness:  sedated, patient cooperative and responds to stimulation  Airway & Oxygen Therapy:Patient Spontanous Breathing and Patient connected to face mask oxgen  Post-op Assessment:  Report given to PACU RN and Post -op Vital signs reviewed and stable  Post vital signs:  Reviewed and stable  Last Vitals: There were no vitals filed for this visit.  Complications: No apparent anesthesia complications

## 2014-12-10 NOTE — Anesthesia Procedure Notes (Signed)
Procedure Name: Intubation Date/Time: 12/10/2014 7:36 AM Performed by: Maxwell Caul Pre-anesthesia Checklist: Patient identified, Emergency Drugs available, Suction available and Patient being monitored Patient Re-evaluated:Patient Re-evaluated prior to inductionOxygen Delivery Method: Circle System Utilized Preoxygenation: Pre-oxygenation with 100% oxygen Intubation Type: IV induction Ventilation: Mask ventilation without difficulty and Oral airway inserted - appropriate to patient size Laryngoscope Size: Glidescope and 4 Grade View: Grade I Tube type: Oral Tube size: 7.5 mm Number of attempts: 1 Airway Equipment and Method: Stylet and Oral airway Placement Confirmation: ETT inserted through vocal cords under direct vision,  positive ETCO2 and breath sounds checked- equal and bilateral Secured at: 21 cm Tube secured with: Tape Dental Injury: Teeth and Oropharynx as per pre-operative assessment  Difficulty Due To: Difficulty was unanticipated, Difficult Airway- due to anterior larynx and Difficult Airway- due to limited oral opening Comments: DL X 1 with MAC 4 with Grade 3 view. DL with Glidescope 4 with Grade 1 view and easy passage of ETT with =BBS, +ETCO2.

## 2014-12-10 NOTE — Interval H&P Note (Signed)
History and Physical Interval Note:  12/10/2014 7:11 AM  [name]  has presented today for surgery, with the diagnosis of Umbilical Ventral Wall Hernia  The various methods of treatment have been discussed with the patient and family. After consideration of risks, benefits and other options for treatment, the patient has consented to  Procedure(s): [place] (N/A) INSERTION OF MESH (N/A) as a surgical intervention .  The patient's history has been reviewed, patient examined, no change in status, stable for surgery.  I have reviewed the patient's chart and labs.  Questions were answered to the patient's satisfaction.     Lonny Eisen C.

## 2014-12-10 NOTE — Anesthesia Preprocedure Evaluation (Signed)
Anesthesia Evaluation  Patient identified by MRN, date of birth, ID band Patient awake    Reviewed: Allergy & Precautions, NPO status   Airway Mallampati: II  TM Distance: >3 FB Neck ROM: Full    Dental   Pulmonary shortness of breath, pneumonia, COPD, former smoker,    breath sounds clear to auscultation- rhonchi       Cardiovascular hypertension, + CAD   Rhythm:Regular Rate:Normal     Neuro/Psych    GI/Hepatic GERD  ,(+) Hepatitis -  Endo/Other    Renal/GU Renal disease     Musculoskeletal   Abdominal   Peds  Hematology   Anesthesia Other Findings   Reproductive/Obstetrics                             Anesthesia Physical Anesthesia Plan  ASA: III  Anesthesia Plan: General   Post-op Pain Management:    Induction: Intravenous  Airway Management Planned: Oral ETT  Additional Equipment:   Intra-op Plan:   Post-operative Plan: Extubation in OR  Informed Consent: I have reviewed the patients History and Physical, chart, labs and discussed the procedure including the risks, benefits and alternatives for the proposed anesthesia with the patient or authorized representative who has indicated his/her understanding and acceptance.   Dental advisory given  Plan Discussed with: CRNA and Anesthesiologist  Anesthesia Plan Comments:         Anesthesia Quick Evaluation

## 2014-12-10 NOTE — H&P (Signed)
[name]  Location: [facility] Patient #: [id number] DOB: 05-Mar-1955 Single / Language: Cleophus Molt / Race: White Female   History of Present Illness Janice Hector MD; 09/10/2014 4:54 PM) Patient words: umb hernia.  The patient is a 59 year old female who presents with an umbilical hernia. Patient sent for surgical consultation by her gynecologist Dr. Senaida Ores. Concern for painful umbilical hernia.  Morbidly obese female. History of hepatitis exposure. Develop cirrhosis. History of polysubstance abuse in the past. No longer smoking. No or cocaine use. By [facility] hepatology. Has received therapy and seems to have reverse some of her disease. Early had ascites in the past but no recent events. Her function test rather normal in the past year or so. Patient noticed increased pain at her bellybutton. Mentioned to her gynecologist. Concern for hernia. Surgical consultation requested. Because she lives in [place], she was hoping to see a Psychologist, sport and exercise in town here. Eyes any episodes of jaundice. No episodes of epistaxis or horrible bleeding. Parents to be constipated. Does have some chronic pain issues with intermittent narcotics. A bowel movement about every other day. On MiraLAX. She is on full anticoagulation - Xerelto. 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 Abdominal pain is bothering her and her bellybutton. Walking as well as she used to. She has had a tubal ligation. She's had an open abdominal hysterectomy 11 years ago. No other surgery since.  No new events.  Other Problems Elbert Ewings, CMA; 09/10/2014 4:16 PM) Anxiety Disorder Arthritis Chronic Obstructive Lung Disease Cirrhosis Of Liver Gastroesophageal Reflux Disease Hepatitis Oophorectomy  Past Surgical History Elbert Ewings, CMA; 09/10/2014 4:16 PM) Hysterectomy (not due to cancer) - Complete  Allergies Elbert Ewings, CMA; 09/10/2014 4:18 PM) Cymbalta  *ANTIDEPRESSANTS* Elavil *ANTIDEPRESSANTS* Heparin (Porcine) in D5W *ANTICOAGULANTS* DULoxetine HCl *ANTIDEPRESSANTS* Gabapentin *ANTICONVULSANTS* Neomycin Sulfate *AMINOGLYCOSIDES*  Medication History Elbert Ewings, CMA; 09/10/2014 4:19 PM) TraMADol HCl (50MG  Tablet, Oral) Active. Symbicort (160-4.5MCG/ACT Aerosol, Inhalation) Active. Spironolactone (50MG  Tablet, Oral) Active. Pantoprazole Sodium (40MG  Tablet DR, Oral) Active. Polyethylene Glycol 3350 (Oral) Active. ProAir HFA (108 (90 Base)MCG/ACT Aerosol Soln, Inhalation) Active. Fanapt (6MG  Tablet, Oral) Active. Entecavir (0.5MG  Tablet, Oral) Active. Citalopram Hydrobromide (20MG  Tablet, Oral) Active. Etodolac (400MG  Tablet, Oral) Active. Medications Reconciled  Social History Elbert Ewings, [place]; 09/10/2014 4:16 PM) Alcohol use Recently quit alcohol use. Caffeine use Coffee, Tea. Illicit drug use Uses daily.  Family History Elbert Ewings, [place]; 09/10/2014 4:16 PM) Cancer Father. Hypertension Mother. Respiratory Condition Father.  Pregnancy / Birth History Elbert Ewings, CMA; 09/10/2014 4:16 PM) Age at menarche 25 years. Contraceptive History Oral contraceptives. Para 1 Regular periods    Review of Systems Elbert Ewings CMA; 09/10/2014 4:16 PM) General Present- Fatigue and Night Sweats. Not Present- Appetite Loss, Chills, Fever, Weight Gain and Weight Loss. Skin Not Present- Change in Wart/Mole, Dryness, Hives, Jaundice, New Lesions, Non-Healing Wounds, Rash and Ulcer. HEENT Present- Wears glasses/contact lenses. Not Present- Earache, Hearing Loss, Hoarseness, Nose Bleed, Oral Ulcers, Ringing in the Ears, Seasonal Allergies, Sinus Pain, Sore Throat, Visual Disturbances and Yellow Eyes. Respiratory Present- Wheezing. Not Present- Bloody sputum, Chronic Cough, Difficulty Breathing and Snoring. Gastrointestinal Not Present- Abdominal Pain, Bloating, Bloody Stool, Change in Bowel Habits, Chronic diarrhea,  Constipation, Difficulty Swallowing, Excessive gas, Gets full quickly at meals, Hemorrhoids, Indigestion, Nausea, Rectal Pain and Vomiting. Female Genitourinary Not Present- Frequency, Nocturia, Painful Urination, Pelvic Pain and Urgency. Musculoskeletal Present- Back Pain. Not Present- Joint Pain, Joint Stiffness, Muscle Pain, Muscle Weakness and Swelling  of Extremities. Neurological Not Present- Decreased Memory, Fainting, Headaches, Numbness, Seizures, Tingling, Tremor, Trouble walking and Weakness. Psychiatric Present- Anxiety, Bipolar and Change in Sleep Pattern. Not Present- Depression, Fearful and Frequent crying. Hematology Present- Easy Bruising. Not Present- Excessive bleeding, Gland problems, HIV and Persistent Infections.  Vitals Elbert Ewings CMA; 09/10/2014 4:20 PM) 09/10/2014 4:19 PM Weight: 217 lb Height: 64in Body Surface Area: 2.11 m Body Mass Index: 37.25 kg/m  Temp.: 83F(Oral)  Pulse: 88 (Regular)  BP: 136/68 (Sitting, Left Arm, Standard)     Physical Exam Janice Hector MD; 09/10/2014 4:43 PM) General Mental Status-Alert. General Appearance-Not in acute distress, Not Sickly. Orientation-Oriented X3. Hydration-Well hydrated. Voice-Normal.  Integumentary Global Assessment Upon inspection and palpation of skin surfaces of the - Axillae: non-tender, no inflammation or ulceration, no drainage. and Distribution of scalp and body hair is normal. General Characteristics Temperature - normal warmth is noted.  Head and Neck Head-normocephalic, atraumatic with no lesions or palpable masses. Face Global Assessment - atraumatic, no absence of expression. Neck Global Assessment - no abnormal movements, no bruit auscultated on the right, no bruit auscultated on the left, no decreased range of motion, non-tender. Trachea-midline. Thyroid Gland Characteristics - non-tender.  Eye Eyeball - Left-Extraocular movements intact, No  Nystagmus. Eyeball - Right-Extraocular movements intact, No Nystagmus. Cornea - Left-No Hazy. Cornea - Right-No Hazy. Sclera/Conjunctiva - Left-No scleral icterus, No Discharge. Sclera/Conjunctiva - Right-No scleral icterus, No Discharge. Pupil - Left-Direct reaction to light normal. Pupil - Right-Direct reaction to light normal.  ENMT Ears Pinna - Left - no drainage observed, no generalized tenderness observed. Right - no drainage observed, no generalized tenderness observed. Nose and Sinuses External Inspection of the Nose - no destructive lesion observed. Inspection of the nares - Left - quiet respiration. Right - quiet respiration. Mouth and Throat Lips - Upper Lip - no fissures observed, no pallor noted. Lower Lip - no fissures observed, no pallor noted. Nasopharynx - no discharge present. Oral Cavity/Oropharynx - Tongue - no dryness observed. Oral Mucosa - no cyanosis observed. Hypopharynx - no evidence of airway distress observed.  Chest and Lung Exam Inspection Movements - Normal and Symmetrical. Accessory muscles - No use of accessory muscles in breathing. Palpation Palpation of the chest reveals - Non-tender. Auscultation Breath sounds - Normal and Clear.  Cardiovascular Auscultation Rhythm - Regular. Murmurs & Other Heart Sounds - Auscultation of the heart reveals - No Murmurs and No Systolic Clicks.  Abdomen Inspection Inspection of the abdomen reveals - No Visible peristalsis and No Abnormal pulsations. Umbilicus - No Bleeding, No Urine drainage. Palpation/Percussion Palpation and Percussion of the abdomen reveal - Soft, Non Tender, No Rebound tenderness, No Rigidity (guarding) and No Cutaneous hyperesthesia. Note: Morbidly obese daily soft. No definite fluid wave. 3 by 2 cm deep umbilical mass mostly reducible. Sensitive. c/w hernia   Female Genitourinary Sexual Maturity Tanner 5 - Adult hair pattern. Note: Well-healed Pfannenstiel incision. No  obvious hernia there. No vaginal bleeding nor discharge. No inguinal hernias   Peripheral Vascular Upper Extremity Inspection - Left - No Cyanotic nailbeds, Not Ischemic. Right - No Cyanotic nailbeds, Not Ischemic.  Neurologic Neurologic evaluation reveals -normal attention span and ability to concentrate, able to name objects and repeat phrases. Appropriate fund of knowledge , normal sensation and normal coordination. Mental Status Affect - not angry, not paranoid. Cranial Nerves-Normal Bilaterally. Gait-Normal.  Neuropsychiatric Mental status exam performed with findings of-able to articulate well with normal speech/language, rate, volume and coherence, thought content normal with ability  to perform basic computations and apply abstract reasoning and no evidence of hallucinations, delusions, obsessions or homicidal/suicidal ideation.  Musculoskeletal Global Assessment Spine, Ribs and Pelvis - no instability, subluxation or laxity. Right Upper Extremity - no instability, subluxation or laxity.  Lymphatic Head & Neck  General Head & Neck Lymphatics: Bilateral - Description - No Localized lymphadenopathy. Axillary  General Axillary Region: Bilateral - Description - No Localized lymphadenopathy. Femoral & Inguinal  Generalized Femoral & Inguinal Lymphatics: Left - Description - No Localized lymphadenopathy. Right - Description - No Localized lymphadenopathy.    Assessment & Plan Janice Hector MD; [id number] PM) UMBILICAL HERNIA WITHOUT OBSTRUCTION AND WITHOUT GANGRENE (553.1  K42.9) Impression: Because it seems to be less reducible and increasingly symptomatic, think it is reasonable to repair the hernia.  I would like medical clearance first given her history of anticoagulation and cirrhosis. On initial evaluation makes her Ardine Eng A/B cirrhotic and not very brittle. Certainly get labs beforehand. If Dr. Monica Martinez at [facility] hepatology feels it is safe, then proceed. I  do not see any major risk factors such as a giant defect or brittle cirrhosis to make me more strongly recommend that she get it done by transplant surgeon at [facility].  See if her medical doctors are okay with her coming off her blood thinners. She tells me that she is comes off the [facility] numerous times for dental procedures and there has not been an issue.  Some pain but not severe. I recommended maximizing nonnarcotic pain control. Challenging in a woman who can't be on nonsteroidals because of her blood thinners and probably should not be on a lot of Tylenol with her liver disease. Try ice and heat. I held off on giving her narcotics today since she gets narcotics for numerous different doctors. I recommend she d/w her primary care physician first so that only narcotics can be centralized. She saw the point & concurred. I'm fine giving her pain medications postoperatively as hernia surgery will hurt in the short term.  Cleared by cardiology & hepatology  Current Plans You are being scheduled for surgery - Our schedulers will call you.  Would like you to get medical clearance to make sure it is safe to hold your blood thinners & that your liver disease is stable enough for surgery.  You should hear from our office's scheduling department within 5 working days about the location, date, and time of surgery. We try to make accommodations for patient's preferences in scheduling surgery, but sometimes the OR schedule or the surgeon's schedule prevents [place] from making those accommodations.  If you have not heard from our office [phone]) in 5 working days, call the office and ask for your surgeon's nurse.  If you have other questions about your diagnosis, plan, or surgery, call the office and ask for your surgeon's nurse.  Written instructions provided    The anatomy & physiology of the abdominal wall was discussed. The pathophysiology of hernias was discussed. Natural history risks without  surgery including progeressive enlargement, pain, incarceration, & strangulation was discussed. Contributors to complications such as smoking, obesity, diabetes, prior surgery, etc were discussed.  I feel the risks of no intervention will lead to serious problems that outweigh the operative risks; therefore, I recommended surgery to reduce and repair the hernia. I explained laparoscopic techniques with possible need for an open approach. I noted the probable use of mesh to patch and/or buttress the hernia repair  Risks such as bleeding, infection, abscess, need for  further treatment, heart attack, death, and other risks were discussed. I noted a good likelihood this will help address the problem. Goals of post-operative recovery were discussed as well. Possibility that this will not correct all symptoms was explained. I stressed the importance of low-impact activity, aggressive pain control, avoiding constipation, & not pushing through pain to minimize risk of post-operative chronic pain or injury. Possibility of reherniation especially with smoking, obesity, diabetes, immunosuppression, and other health conditions was discussed. We will work to minimize complications.  An educational handout further explaining the pathology & treatment options was given as well. Questions were answered. The patient expresses understanding & wishes to proceed with surgery.  Pt Education - Pamphlet Given - Laparoscopic Hernia Repair: discussed with patient and provided information. Pt Education - [facility] Hernia Post-Op HCI (Tiki Tucciarone): discussed with patient and provided information. Pt Education - [facility] Pain Control (Lillyahna Hemberger) Pt Education - [facility] Good Bowel Health (Jaielle Dlouhy)  [name], M.D., F.A.C.S. Gastrointestinal and Minimally Invasive Surgery [facility] [address], [place], [place] [address] [phone] Main / Paging

## 2014-12-10 NOTE — Op Note (Signed)
12/10/2014  11:42 AM  PATIENT:  [name]  59 y.o. female  Patient Care Team: Marijean Bravo, MD as PCP - General (Internal Medicine) Claybon Jabs, MD as Consulting Physician (Gastroenterology) Senaida Ores, MD as Referring Physician (Obstetrics and Gynecology)  PRE-OPERATIVE DIAGNOSIS:  Umbilical Ventral Wall Hernia  POST-OPERATIVE DIAGNOSIS:  Umbilical Ventral Wall Hernia  PROCEDURE:  Procedure(s): LAPAROSCOPIC ASSISTED REPAIR OF INCARCERATED UMBILICAL HERNIA  INSERTION OF MESH  SURGEON:  Surgeon(s): Michael Boston, MD  ASSISTANT: RN   ANESTHESIA:   local and general  EBL:  Total I/O In: 1000 [I.V.:1000] Out: -   Delay start of Pharmacological VTE agent (>24hrs) due to surgical blood loss or risk of bleeding:  no  DRAINS: none   SPECIMEN:  No Specimen  DISPOSITION OF SPECIMEN:  N/A  COUNTS:  YES  PLAN OF CARE: Admit for overnight observation  PATIENT DISPOSITION:  PACU - hemodynamically stable.  INDICATION: Pleasant patient has developed a ventral wall abdominal hernia.   Recommendation was made for surgical repair:  The anatomy & physiology of the abdominal wall was discussed. The pathophysiology of hernias was discussed. Natural history risks without surgery including progeressive enlargement, pain, incarceration & strangulation was discussed. Contributors to complications such as smoking, obesity, diabetes, prior surgery, etc were discussed.  I feel the risks of no intervention will lead to serious problems that outweigh the operative risks; therefore, I recommended surgery to reduce and repair the hernia. I explained laparoscopic techniques with possible need for an open approach. I noted the probable use of mesh to patch and/or buttress the hernia repair  Risks such as bleeding, infection, abscess, need for further treatment, heart attack, death, and other risks were discussed. I noted a good likelihood this will help address the problem. Goals of post-operative  recovery were discussed as well. Possibility that this will not correct all symptoms was explained. I stressed the importance of low-impact activity, aggressive pain control, avoiding constipation, & not pushing through pain to minimize risk of post-operative chronic pain or injury. Possibility of reherniation especially with smoking, obesity, diabetes, immunosuppression, and other health conditions was discussed. We will work to minimize complications.  An educational handout further explaining the pathology & treatment options was given as well. Questions were answered. The patient expresses understanding & wishes to proceed with surgery.   OR FINDINGS: 3 x 2 cm periumbilical hernia is incarcerated with preperitoneal fat and omentum.   Type of repair - Laparoscopic underlay repair   Name of mesh - Bard Ventralight dual sided (polypropylene / Seprafilm)  Size of mesh - Length 15 cm, Width 20 cm  Mesh overlap - 5-7 cm  Placement of mesh - Intraperitoneal underlay repair   DESCRIPTION:   Informed consent was confirmed. The patient underwent general anaesthesia without difficulty. The patient was positioned appropriately. VTE prevention in place. The patient's abdomen was clipped, prepped, & draped in a sterile fashion. Surgical timeout confirmed our plan.   The patient was positioned in reverse Trendelenburg. Abdominal entry was gained using optical entry technique in the left upper abdomen. Entry was clean. I induced carbon dioxide insufflation. Camera inspection revealed no injury. Extra ports were carefully placed under direct laparoscopic visualization.   I could see the hernia in the central abdomen.  I was able to reduce the omentum out of the parallel call hernia with some lysis adhesions.  I did free the periumbilical peritoneum off to help reduce some preperitoneal fat through an infraumbilical hernia as well.  She had  minimal preperitoneal fats I could see the rest very anterior  abdominal wall and there were no hernias elsewhere.   I mapped out the region using a needle passer.   To ensure that I would have at least 5 cm radial coverage outside of the hernia defect, I chose a 20x15 cm dual sided mesh.  I placed #1 Prolene stitches around its edge about every 5 cm = 14 total.  I rolled the mesh & placed into the peritoneal cavity through the 10 cm fascial defect.  I unrolled  the mesh and positioned it appropriately.  I secured the mesh to cover up the hernia defect using a laparoscopic suture passer to pass the tails of the Prolene through the abdominal wall & tagged them with clamps.  I started out in four corners to make sure I had the mesh centered under the hernia defect appropriately, and then proceeded to work in quadrants.  We evacuated CO2 & desufflated the abdomen.  I tied the fascial stitches down.  I reapproximated the periumbilical hernia defects with #1 PDS interrupted.   I reinsufflated the abdomen.  The mesh provided at least 5-10 cm circumferential coverage around the entire region of hernia defects.   There was a knuckle of small bowel stuck centrally.  I released a PDS suture.  That released it.  It is been kinked but not sutured.  Bowel looked healthy.  I closed that fascial defect 0 Vicryl suture with the abdomen insufflated and no involvement of small bowel.  That allowed the umbilicus to be tacked down by its stalk as well.  I tacked the edges & central part of the mesh to the peritoneum/posterior rectus fascia with  SecureStrap absorbable tacks.   Hemostasis was excellent.  Mesh laid well.   Capnoperitoneum was evacuated. Ports were removed. The skin was closed with Monocryl at the port sites and Steri-Strips on the fascial stitch puncture sites.  Patient is being extubated to go to the recovery room. I updated the status of the patient to the patient's sister.  I made recommendations.  I answered questions.  Understanding & appreciation was  expressed.   Adin Hector, M.D., F.A.C.S. Gastrointestinal and Minimally Invasive Surgery [facility] [address], [place], [place] [address] [phone] Main / Paging

## 2014-12-11 DIAGNOSIS — K420 Umbilical hernia with obstruction, without gangrene: Secondary | ICD-10-CM | POA: Diagnosis not present

## 2014-12-11 LAB — COMPREHENSIVE METABOLIC PANEL
ALK PHOS: 74 U/L (ref 38–126)
ALT: 28 U/L (ref 14–54)
ANION GAP: 8 (ref 5–15)
AST: 25 U/L (ref 15–41)
Albumin: 3.8 g/dL (ref 3.5–5.0)
BILIRUBIN TOTAL: 0.5 mg/dL (ref 0.3–1.2)
BUN: 18 mg/dL (ref 6–20)
CALCIUM: 9.6 mg/dL (ref 8.9–10.3)
CO2: 28 mmol/L (ref 22–32)
CREATININE: 1.18 mg/dL — AB (ref 0.44–1.00)
Chloride: 103 mmol/L (ref 101–111)
GFR, EST AFRICAN AMERICAN: 58 mL/min — AB (ref >60)
GFR, EST NON AFRICAN AMERICAN: 50 mL/min — AB (ref >60)
Glucose, Bld: 137 mg/dL — ABNORMAL HIGH (ref 65–99)
Potassium: 4.8 mmol/L (ref 3.5–5.1)
Sodium: 139 mmol/L (ref 135–145)
TOTAL PROTEIN: 6.5 g/dL (ref 6.5–8.1)

## 2014-12-11 LAB — AMMONIA: AMMONIA: 28 umol/L (ref 9–35)

## 2014-12-11 MED ORDER — OXYCODONE HCL 5 MG PO TABS: 5.0000 mg | ORAL_TABLET | ORAL | Status: DC | PRN

## 2014-12-11 MED ORDER — OXYCODONE HCL 5 MG PO TABS: 5.0000 mg | ORAL_TABLET | Freq: Four times a day (QID) | ORAL | Status: DC | PRN

## 2014-12-11 NOTE — Discharge Summary (Signed)
Physician Discharge Summary  Patient ID: [name] MRN: [id number] DOB/AGE: [birth year] 59 y.o.  Admit date: 12/10/2014 Discharge date: 12/11/2014  Patient Care Team: Marijean Bravo, MD as PCP - General (Internal Medicine) Claybon Jabs, MD as Consulting Physician (Gastroenterology) Senaida Ores, MD as Referring Physician (Obstetrics and Gynecology)  Admission Diagnoses: Principal Problem:   Incarcerated incisional hernia s/p lap repair w mesh 12/10/2014 Active Problems:   Hepatic cirrhosis ([place])   Acute hepatitis C virus infection   Anxiety state   Essential hypertension   Obesity   CKD (chronic kidney disease), stage III   COPD (chronic obstructive pulmonary disease) ([place])   Discharge Diagnoses:  Principal Problem:   Incarcerated incisional hernia s/p lap repair w mesh 12/10/2014 Active Problems:   Hepatic cirrhosis ([place])   Acute hepatitis C virus infection   Anxiety state   Essential hypertension   Obesity   CKD (chronic kidney disease), stage III   COPD (chronic obstructive pulmonary disease) ([place])   POST-OPERATIVE DIAGNOSIS:   Umbilical Ventral Wall Hernia  SURGERY:  12/10/2014  Procedure(s): LAPAROSCOPIC ASSISTED REPAIR OF INCARCERATED UMBILICAL HERNIA  INSERTION OF MESH  SURGEON:    Surgeon(s): Michael Boston, MD  Consults: None  Hospital Course:   The patient underwent  the surgery above.  Postoperatively, the patient gradually mobilized and advanced to a solid diet.  Pain and other symptoms were treated aggressively.    By the time of discharge, the patient was walking well the hallways, eating food, having flatus.  Pain was well-controlled on an oral medications.  Based on meeting discharge criteria and continuing to recover, I felt it was safe for the patient to be discharged from the hospital to further recover with close followup. Postoperative recommendations were discussed in detail.  They are written as well.   Significant Diagnostic  Studies:  Results for orders placed or performed during the hospital encounter of 12/10/14 (from the past 72 hour(s))  Comprehensive metabolic panel     Status: Abnormal   Collection Time: 12/11/14  4:35 AM  Result Value Ref Range   Sodium 139 135 - 145 mmol/L   Potassium 4.8 3.5 - 5.1 mmol/L   Chloride 103 101 - 111 mmol/L   CO2 28 22 - 32 mmol/L   Glucose, Bld 137 (H) 65 - 99 mg/dL   BUN 18 6 - 20 mg/dL   Creatinine, Ser 1.18 (H) 0.44 - 1.00 mg/dL   Calcium 9.6 8.9 - 10.3 mg/dL   Total Protein 6.5 6.5 - 8.1 g/dL   Albumin 3.8 3.5 - 5.0 g/dL   AST 25 15 - 41 U/L   ALT 28 14 - 54 U/L   Alkaline Phosphatase 74 38 - 126 U/L   Total Bilirubin 0.5 0.3 - 1.2 mg/dL   GFR calc non Af Amer 50 (L) >60 mL/min   GFR calc Af Amer 58 (L) >60 mL/min    Comment: (NOTE) The eGFR has been calculated using the CKD EPI equation. This calculation has not been validated in all clinical situations. eGFR's persistently <60 mL/min signify possible Chronic Kidney Disease.    Anion gap 8 5 - 15  Ammonia     Status: None   Collection Time: 12/11/14  4:35 AM  Result Value Ref Range   Ammonia 28 9 - 35 umol/L    No results found.  Discharge Exam: Blood pressure 117/70, pulse 58, temperature 97.7 F (36.5 C), temperature source Oral, resp. rate 18, height 5' 4"  (1.626 m), weight 100.925 kg (  222 lb 8 oz), SpO2 96 %.  General: Pt awake/alert/oriented x4 in no major acute distress Eyes: PERRL, normal EOM. Sclera nonicteric Neuro: CN II-XII intact w/o focal sensory/motor deficits. Lymph: No head/neck/groin lymphadenopathy Psych:  No delerium/psychosis/paranoia.  Not groggy HENT: Normocephalic, Mucus membranes moist.  No thrush Neck: Supple, No tracheal deviation Chest: No pain.  Good respiratory excursion. CV:  Pulses intact.  Regular rhythm MS: Normal AROM mjr joints.  No obvious deformity Abdomen: Soft, Nondistended.  Minimally tender.  No incarcerated hernias.  Abd binder in place Ext:  SCDs  BLE.  No significant edema.  No cyanosis Skin: No petechiae / purpura  Discharged Condition: good   Past Medical History  Diagnosis Date  . COPD (chronic obstructive pulmonary disease) ([place])   . Cirrhosis ([place])   . Pulmonary emboli ([place]) 09/2013  . Renal insufficiency   . Kidney stones   . Heart murmur   . DVT (deep venous thrombosis) ([place]) 09/2013    RLE  . Pneumonia     "4 times in the past year" (10/30/2013)  . History of blood transfusion     "due to excessive blood loss before hysterectomy"  . GERD (gastroesophageal reflux disease)   . Hepatitis B   . Arthritis     "knees; lower back" (10/30/2013)  . Anxiety   . Depression   . Bipolar disorder ([place])   . Hypertension     currently on no medication   . Shortness of breath dyspnea     increased exertion;exercise  . Agoraphobia with panic attacks   . History of urinary tract infection   . Urinary incontinence   . Urinary frequency   . Altered mental state 10/26/2013  . Ankle fracture, left 11/27/2010    S/p ORIF 11/2   . Cavitary pneumonia     Past Surgical History  Procedure Laterality Date  . Carpal tunnel release Left   . Fracture surgery    . Ankle fracture surgery Left   . Abdominal hysterectomy    . Tubal ligation    . Dilation and curettage of uterus  X 2  . Hand surgery      1980's/left hand   . Laparoscopic assisted ventral hernia repair N/A 12/10/2014    Procedure: LAPAROSCOPIC ASSISTED REPAIR OF INCARCERATED UMBILICAL HERNIA ;  Surgeon: Michael Boston, MD;  Location: [facility];  Service: General;  Laterality: N/A;  . Insertion of mesh N/A 12/10/2014    Procedure: INSERTION OF MESH;  Surgeon: Michael Boston, MD;  Location: [facility];  Service: General;  Laterality: N/A;    Social History   Social History  . Marital Status: Single    Spouse Name: N/A  . Number of Children: N/A  . Years of Education: N/A   Occupational History  . Not on file.   Social History Main Topics  . Smoking status: Former Smoker  -- 1.00 packs/day for 35 years    Types: Cigarettes    Quit date: 10/26/2013  . Smokeless tobacco: Never Used  . Alcohol Use: No     Comment: "stopped drinking  in 2004"  . Drug Use: No     Comment: 10/30/2013 "last crack in 2014; last marijuana in ~ 1990" drug free for last 3 years   . Sexual Activity: No   Other Topics Concern  . Not on file   Social History Narrative    Family History  Problem Relation Age of Onset  . Stroke Mother   . Lung cancer Father  Current Facility-Administered Medications  Medication Dose Route Frequency Provider Last Rate Last Dose  . 0.9 %  sodium chloride infusion  250 mL Intravenous PRN Michael Boston, MD      . acetaminophen (TYLENOL) tablet 500 mg  500 mg Oral Q6H PRN Michael Boston, MD      . albuterol (PROVENTIL) (2.5 MG/3ML) 0.083% nebulizer solution 2.5 mg  2.5 mg Nebulization Q6H PRN Michael Boston, MD      . ALPRAZolam Duanne Moron) tablet 1 mg  1 mg Oral TID PRN Michael Boston, MD   1 mg at 12/11/14 0253  . alum & mag hydroxide-simeth (MAALOX/MYLANTA) 200-200-20 MG/5ML suspension 30 mL  30 mL Oral Q6H PRN Michael Boston, MD   30 mL at 12/10/14 2121  . citalopram (CELEXA) tablet 20 mg  20 mg Oral QHS Michael Boston, MD   20 mg at 12/10/14 2113  . diphenhydrAMINE (BENADRYL) injection 12.5-25 mg  12.5-25 mg Intravenous Q6H PRN Michael Boston, MD      . Derrill Memo ON 12/12/2014] entecavir (BARACLUDE) tablet 0.5 mg  0.5 mg Oral Carlis Abbott, MD      . etodolac (LODINE) tablet 400 mg  400 mg Oral BID PRN Michael Boston, MD      . HYDROmorphone (DILAUDID) injection 0.5-2 mg  0.5-2 mg Intravenous Q2H PRN Michael Boston, MD   1 mg at 12/11/14 0256  . ibuprofen (ADVIL,MOTRIN) tablet 400 mg  400 mg Oral Q6H PRN Michael Boston, MD      . Iloperidone TABS 12 mg  12 mg Oral q morning - 10a Michael Boston, MD      . lactated ringers bolus 1,000 mL  1,000 mL Intravenous Q8H PRN Michael Boston, MD      . lactulose (CHRONULAC) 10 GM/15ML solution 20 g  20 g Oral Q12H PRN Michael Boston, MD      . lip balm (CARMEX) ointment 1 application  1 application Topical BID Michael Boston, MD   1 application at 37/90/24 2115  . magic mouthwash  15 mL Oral QID PRN Michael Boston, MD      . menthol-cetylpyridinium (CEPACOL) lozenge 3 mg  1 lozenge Oral PRN Michael Boston, MD      . metoprolol (LOPRESSOR) injection 5 mg  5 mg Intravenous Q6H PRN Michael Boston, MD      . ondansetron [facility]) injection 4 mg  4 mg Intravenous Q6H PRN Michael Boston, MD       Or  . ondansetron (ZOFRAN) 8 mg in sodium chloride 0.9 % 50 mL IVPB  8 mg Intravenous Q6H PRN Michael Boston, MD      . ondansetron (ZOFRAN-ODT) disintegrating tablet 4-8 mg  4-8 mg Oral Q6H PRN Michael Boston, MD      . oxyCODONE (Oxy IR/ROXICODONE) immediate release tablet 5-10 mg  5-10 mg Oral Q4H PRN Michael Boston, MD      . pantoprazole (PROTONIX) EC tablet 40 mg  40 mg Oral Daily Michael Boston, MD      . phenol (CHLORASEPTIC) mouth spray 2 spray  2 spray Mouth/Throat PRN Michael Boston, MD      . polyethylene glycol (MIRALAX / GLYCOLAX) packet 17 g  17 g Oral BID Michael Boston, MD      . promethazine (PHENERGAN) injection 6.25-12.5 mg  6.25-12.5 mg Intravenous Q4H PRN Michael Boston, MD      . sodium chloride 0.9 % injection 3 mL  3 mL Intravenous Q12H Michael Boston, MD   3 mL at 12/10/14 2115  .  sodium chloride 0.9 % injection 3 mL  3 mL Intravenous PRN Michael Boston, MD      . spironolactone (ALDACTONE) tablet 50 mg  50 mg Oral BID Michael Boston, MD   50 mg at 12/10/14 2114  . temazepam (RESTORIL) capsule 30 mg  30 mg Oral QHS Michael Boston, MD   30 mg at 12/10/14 2113  . traMADol (ULTRAM) tablet 50-100 mg  50-100 mg Oral Q6H PRN Michael Boston, MD   100 mg at 12/11/14 3383     Allergies  Allergen Reactions  . Heparin Other (See Comments)    HIT ab positive with elevated OD  . Cymbalta [Duloxetine Hcl] Other (See Comments)    headaches  . Elavil [Amitriptyline] Other (See Comments)    Causes her to be very "disoriented".  . Gabapentin Other  (See Comments)    REACTION: rash in mouth  . Neomycin-Bacitracin Zn-Polymyx Rash    Disposition: 01-Home or Self Care  Discharge Instructions    Call MD for:  extreme fatigue    Complete by:  As directed      Call MD for:  extreme fatigue    Complete by:  As directed      Call MD for:  hives    Complete by:  As directed      Call MD for:  hives    Complete by:  As directed      Call MD for:  persistant nausea and vomiting    Complete by:  As directed      Call MD for:  persistant nausea and vomiting    Complete by:  As directed      Call MD for:  redness, tenderness, or signs of infection (pain, swelling, redness, odor or green/yellow discharge around incision site)    Complete by:  As directed      Call MD for:  redness, tenderness, or signs of infection (pain, swelling, redness, odor or green/yellow discharge around incision site)    Complete by:  As directed      Call MD for:  severe uncontrolled pain    Complete by:  As directed      Call MD for:  severe uncontrolled pain    Complete by:  As directed      Call MD for:    Complete by:  As directed   Temperature > 101.67F     Call MD for:    Complete by:  As directed   Temperature > 101.67F     Diet - low sodium heart healthy    Complete by:  As directed      Discharge instructions    Complete by:  As directed   Please see discharge instruction sheets.  Also refer to handout given an office.  Please call our office if you have any questions or concerns [phone]     Discharge instructions    Complete by:  As directed   Please see discharge instruction sheets.  Also refer to handout given an office.  Please call our office if you have any questions or concerns [phone]     Discharge wound care:    Complete by:  As directed   If you have closed incisions, shower and bathe over these incisions with soap and water every day.  Remove all surgical dressings on postoperative day #3.  You do not need to replace dressings  over the closed incisions unless you feel more comfortable with a Band-Aid covering it.  If you have an open wound that requires packing, please see wound care instructions.  In general, remove all dressings, wash wound with soap and water and then replace with saline moistened gauze.  Do the dressing change at least every day.  Please call our office [phone] if you have further questions.     Discharge wound care:    Complete by:  As directed   If you have closed incisions, shower and bathe over these incisions with soap and water every day.  Remove all surgical dressings on postoperative day #3.  You do not need to replace dressings over the closed incisions unless you feel more comfortable with a Band-Aid covering it.   If you have an open wound that requires packing, please see wound care instructions.  In general, remove all dressings, wash wound with soap and water and then replace with saline moistened gauze.  Do the dressing change at least every day.  Please call our office [phone] if you have further questions.     Driving Restrictions    Complete by:  As directed   No driving until off narcotics and can safely swerve away without pain during an emergency     Driving Restrictions    Complete by:  As directed   No driving until off narcotics and can safely swerve away without pain during an emergency     Increase activity slowly    Complete by:  As directed   Walk an hour a day.  Use 20-30 minute walks.  When you can walk 30 minutes without difficulty, it is fine to restart low impact/moderate activities such as biking, jogging, swimming, sexual activity, etc.  Eventually you can increase to unrestricted activity when not feeling pain.  If you feel pain: STOP!Marland Kitchen   Let pain protect you from overdoing it.  Use ice/heat & over-the-counter pain medications to help minimize soreness.  If that is not enough, then use your narcotic pain prescription as needed to remain active.  It is  better to take extra pain medications and be more active than to stay bedridden to avoid all pain medications.     Increase activity slowly    Complete by:  As directed   Walk an hour a day.  Use 20-30 minute walks.  When you can walk 30 minutes without difficulty, it is fine to restart low impact/moderate activities such as biking, jogging, swimming, sexual activity, etc.  Eventually you can increase to unrestricted activity when not feeling pain.  If you feel pain: STOP!Marland Kitchen   Let pain protect you from overdoing it.  Use ice/heat & over-the-counter pain medications to help minimize soreness.  If that is not enough, then use your narcotic pain prescription as needed to remain active.  It is better to take extra pain medications and be more active than to stay bedridden to avoid all pain medications.     Lifting restrictions    Complete by:  As directed   Avoid heavy lifting initially.  Do not push through pain.  You have no specific weight limit - if it hurts to do, DON'T DO IT.   If you feel no pain, you are not injuring anything.  Pain will protect you from injury.  Coughing and sneezing are far more stressful to your incision than any lifting.  Avoid resuming heavy lifting / intense activity until off all narcotic pain medications.  When ready to exercise more, give yourself 2 weeks to gradually get back to full intense  exercise/activity.     Lifting restrictions    Complete by:  As directed   Avoid heavy lifting initially.  Do not push through pain.  You have no specific weight limit - if it hurts to do, DON'T DO IT.   If you feel no pain, you are not injuring anything.  Pain will protect you from injury.  Coughing and sneezing are far more stressful to your incision than any lifting.  Avoid resuming heavy lifting / intense activity until off all narcotic pain medications.  When ready to exercise more, give yourself 2 weeks to gradually get back to full intense exercise/activity.     May shower / Bathe     Complete by:  As directed      May shower / Bathe    Complete by:  As directed      May walk up steps    Complete by:  As directed      May walk up steps    Complete by:  As directed      Sexual Activity Restrictions    Complete by:  As directed   Sexual activity as tolerated.  Do not push through pain.  Pain will protect you from injury.     Sexual Activity Restrictions    Complete by:  As directed   Sexual activity as tolerated.  Do not push through pain.  Pain will protect you from injury.     Walk with assistance    Complete by:  As directed   Walk over an hour a day.  May use a walker/cane/companion to help with balance and stamina.     Walk with assistance    Complete by:  As directed   Walk over an hour a day.  May use a walker/cane/companion to help with balance and stamina.            Medication List    STOP taking these medications        ibuprofen 200 MG tablet  Commonly known as:  ADVIL,MOTRIN      TAKE these medications        AEROCHAMBER MV inhaler  Use as instructed     ALPRAZolam 1 MG tablet  Commonly known as:  XANAX  Take 1 mg by mouth 3 (three) times daily as needed for anxiety.     citalopram 20 MG tablet  Commonly known as:  CELEXA  Take 20 mg by mouth at bedtime.     entecavir 0.5 MG tablet  Commonly known as:  BARACLUDE  Take 0.5 mg by mouth every other day.     etodolac 400 MG tablet  Commonly known as:  LODINE  Take 1 tablet by mouth 2 (two) times daily as needed (pain).     FANAPT 6 MG Tabs  Generic drug:  Iloperidone  Take 12 mg by mouth every morning.     oxyCODONE 5 MG immediate release tablet  Commonly known as:  Oxy IR/ROXICODONE  Take 1-2 tablets (5-10 mg total) by mouth every 6 (six) hours as needed for moderate pain, severe pain or breakthrough pain.     pantoprazole 40 MG tablet  Commonly known as:  PROTONIX  Take 40 mg by mouth daily.     PROAIR HFA 108 (90 BASE) MCG/ACT inhaler  Generic drug:  albuterol  Inhale  2 puffs into the lungs every 6 (six) hours as needed for wheezing or shortness of breath.     rivaroxaban 20 MG Tabs tablet  Commonly known as:  XARELTO  Take 1 tablet (20 mg total) by mouth daily with supper.  Start taking on:  12/12/2014     spironolactone 50 MG tablet  Commonly known as:  ALDACTONE  Take 50 mg by mouth 2 (two) times daily.     temazepam 30 MG capsule  Commonly known as:  RESTORIL  Take 30 mg by mouth at bedtime.     traMADol 50 MG tablet  Commonly known as:  ULTRAM  Take 1-2 tablets (50-100 mg total) by mouth every 6 (six) hours as needed for moderate pain or severe pain.     valACYclovir 500 MG tablet  Commonly known as:  VALTREX  TAKE 1 TABLET (500 MG TOTAL) BY MOUTH TWO (2) TIMES A DAY. FOR 7 DAYS FOR COLD SORES           Follow-up Information    Follow up with Amit Meloy C., MD. Schedule an appointment as soon as possible for a visit in 3 weeks.   Specialty:  General Surgery   Why:  To follow up after your operation, To follow up after your hospital stay   Contact information:   [address] [place] [address] [phone]        Signed: Morton Peters, M.D., F.A.C.S. Gastrointestinal and Minimally Invasive Surgery [facility] [address] [place], [place] [address] [phone] Main / Paging   12/11/2014, 8:51 AM

## 2014-12-11 NOTE — Discharge Instructions (Signed)
HERNIA REPAIR: POST OP INSTRUCTIONS ° °1. DIET: Follow a light bland diet the first 24 hours after arrival home, such as soup, liquids, crackers, etc.  Be sure to include lots of fluids daily.  Avoid fast food or heavy meals as your are more likely to get nauseated.  Eat a low fat the next few days after surgery. °2. Take your usually prescribed home medications unless otherwise directed. °3. PAIN CONTROL: °a. Pain is best controlled by a usual combination of three different methods TOGETHER: °i. Ice/Heat °ii. Over the counter pain medication °iii. Prescription pain medication °b. Most patients will experience some swelling and bruising around the hernia(s) such as the bellybutton, groins, or old incisions.  Ice packs or heating pads (30-60 minutes up to 6 times a day) will help. Use ice for the first few days to help decrease swelling and bruising, then switch to heat to help relax tight/sore spots and speed recovery.  Some people prefer to use ice alone, heat alone, alternating between ice & heat.  Experiment to what works for you.  Swelling and bruising can take several weeks to resolve.   °c. It is helpful to take an over-the-counter pain medication regularly for the first few weeks.  Choose one of the following that works best for you: °i. Naproxen (Aleve, etc)  Two 220mg tabs twice a day °ii. Ibuprofen (Advil, etc) Three 200mg tabs four times a day (every meal & bedtime) °iii. Acetaminophen (Tylenol, etc) 325-650mg four times a day (every meal & bedtime) °d. A  prescription for pain medication should be given to you upon discharge.  Take your pain medication as prescribed.  °i. If you are having problems/concerns with the prescription medicine (does not control pain, nausea, vomiting, rash, itching, etc), please call us [phone] to see if we need to switch you to a different pain medicine that will work better for you and/or control your side effect better. °ii. If you need a refill on your pain  medication, please contact your pharmacy.  They will contact our office to request authorization. Prescriptions will not be filled after 5 pm or on week-ends. °4. Avoid getting constipated.  Between the surgery and the pain medications, it is common to experience some constipation.  Increasing fluid intake and taking a fiber supplement (such as Metamucil, Citrucel, FiberCon, MiraLax, etc) 1-2 times a day regularly will usually help prevent this problem from occurring.  A mild laxative (prune juice, Milk of Magnesia, MiraLax, etc) should be taken according to package directions if there are no bowel movements after 48 hours.   °5. Wash / shower every day.  You may shower over the dressings as they are waterproof.   °6. Remove your waterproof bandages 5 days after surgery.  You may leave the incision open to air.  You may replace a dressing/Band-Aid to cover the incision for comfort if you wish.  Continue to shower over incision(s) after the dressing is off. ° ° ° °7. ACTIVITIES as tolerated:   °a. You may resume regular (light) daily activities beginning the next day--such as daily self-care, walking, climbing stairs--gradually increasing activities as tolerated.  If you can walk 30 minutes without difficulty, it is safe to try more intense activity such as jogging, treadmill, bicycling, low-impact aerobics, swimming, etc. °b. Save the most intensive and strenuous activity for last such as sit-ups, heavy lifting, contact sports, etc  Refrain from any heavy lifting or straining until you are off narcotics for pain control.   °  c. DO NOT PUSH THROUGH PAIN.  Let pain be your guide: If it hurts to do something, don't do it.  Pain is your body warning you to avoid that activity for another week until the pain goes down. d. You may drive when you are no longer taking prescription pain medication, you can comfortably wear a seatbelt, and you can safely maneuver your car and apply brakes. e. Dennis Bast may have sexual intercourse  when it is comfortable.  8. FOLLOW UP in our office a. Please call [facility] at [phone] to set up an appointment to see your surgeon in the office for a follow-up appointment approximately 2-3 weeks after your surgery. b. Make sure that you call for this appointment the day you arrive home to insure a convenient appointment time. 9.  IF YOU HAVE DISABILITY OR FAMILY LEAVE FORMS, BRING THEM TO THE OFFICE FOR PROCESSING.  DO NOT GIVE THEM TO YOUR DOCTOR.  WHEN TO CALL us [phone]: 1. Poor pain control 2. Reactions / problems with new medications (rash/itching, nausea, etc)  3. Fever over 101.5 F (38.5 C) 4. Inability to urinate 5. Nausea and/or vomiting 6. Worsening swelling or bruising 7. Continued bleeding from incision. 8. Increased pain, redness, or drainage from the incision   The clinic staff is available to answer your questions during regular business hours (8:30am-5pm).  Please dont hesitate to call and ask to speak to one of our nurses for clinical concerns.   If you have a medical emergency, go to the nearest emergency room or call 911.  A surgeon from [facility] is always on call at the hospitals in [facility], [place], [place], [place], [place]  [address] ?  [address] [address], [place], [place]   [address] MAIN: [phone] ? TOLL FREE: [phone] ? FAX: [phone] [email or web address]  Managing Pain  Pain after surgery or related to activity is often due to strain/injury to muscle, tendon, nerves and/or incisions.  This pain is usually short-term and will improve in a few months.   Many people find it helpful to do the following things TOGETHER to help speed the process of healing and to get back to regular activity more quickly:  1. Avoid heavy physical activity at first a. No lifting greater than 20 pounds at first, then increase to lifting as tolerated over the next few weeks b. Do not push  through the pain.  Listen to your body and avoid positions and maneuvers than reproduce the pain.  Wait a few days before trying something more intense c. Walking is okay as tolerated, but go slowly and stop when getting sore.  If you can walk 30 minutes without stopping or pain, you can try more intense activity (running, jogging, aerobics, cycling, swimming, treadmill, sex, sports, weightlifting, etc ) d. Remember: If it hurts to do it, then dont do it!  2. Take Anti-inflammatory medication a. Choose Acetaminophen 500mg  tabs (Tylenol) 1 pill with every meal and just before bedtime (avoid more than 2g a day)          b. Take with food/snack around the clock for 1-2 weeks i. This helps the muscle and nerve tissues become less irritable and calm down faster  3. Use a Heating pad or Ice/Cold Pack a. 4-6 times a day b. May use warm bath/hottub  or showers  4. Try Gentle Massage and/or Stretching  a. at the area of pain many times a day b.  stop if you feel pain - do not overdo it  Try these steps together to help you body heal faster and avoid making things get worse.  Doing just one of these things may not be enough.    If you are not getting better after two weeks or are noticing you are getting worse, contact our office for further advice; we may need to re-evaluate you & see what other things we can do to help.  GETTING TO GOOD BOWEL HEALTH. Irregular bowel habits such as constipation and diarrhea can lead to many problems over time.  Having one soft bowel movement a day is the most important way to prevent further problems.  The anorectal canal is designed to handle stretching and feces to safely manage our ability to get rid of solid waste (feces, poop, stool) out of our body.  BUT, hard constipated stools can act like ripping concrete bricks and diarrhea can be a burning fire to this very sensitive area of our body, causing inflamed hemorrhoids, anal fissures, increasing risk is perirectal  abscesses, abdominal pain/bloating, an making irritable bowel worse.      The goal: ONE SOFT BOWEL MOVEMENT A DAY!  To have soft, regular bowel movements:   Drink plenty of fluids, consider 4-6 tall glasses of water a day.    Take plenty of fiber.  Fiber is the undigested part of plant food that passes into the colon, acting s natures broom to encourage bowel motility and movement.  Fiber can absorb and hold large amounts of water. This results in a larger, bulkier stool, which is soft and easier to pass. Work gradually over several weeks up to 6 servings a day of fiber (25g a day even more if needed) in the form of: o Vegetables -- Root (potatoes, carrots, turnips), leafy green (lettuce, salad greens, celery, spinach), or cooked high residue (cabbage, broccoli, etc) o Fruit -- Fresh (unpeeled skin & pulp), Dried (prunes, apricots, cherries, etc ),  or stewed ( applesauce)  o Whole grain breads, pasta, etc (whole wheat)  o Bran cereals   Bulking Agents -- This type of water-retaining fiber generally is easily obtained each day by one of the following:  o Psyllium bran -- The psyllium plant is remarkable because its ground seeds can retain so much water. This product is available as Metamucil, Konsyl, Effersyllium, Per Diem Fiber, or the less expensive generic preparation in drug and health food stores. Although labeled a laxative, it really is not a laxative.  o Methylcellulose -- This is another fiber derived from wood which also retains water. It is available as Citrucel. o Polyethylene Glycol - and artificial fiber commonly called Miralax or Glycolax.  It is helpful for people with gassy or bloated feelings with regular fiber o Flax Seed - a less gassy fiber than psyllium  No reading or other relaxing activity while on the toilet. If bowel movements take longer than 5 minutes, you are too constipated  AVOID CONSTIPATION.  High fiber and water intake usually takes care of this.  Sometimes a  laxative is needed to stimulate more frequent bowel movements, but   Laxatives are not a good long-term solution as it can wear the colon out.  They can help jump-start bowels if constipated, but should be relied on constantly without discussing with your doctor o Osmotics (Milk of Magnesia, Fleets phosphosoda, Magnesium citrate, MiraLax, GoLytely) are safer than  o Stimulants (Senokot, Castor Oil, Dulcolax, Ex Lax)    o Avoid taking  laxatives for more than 7 days in a row.   IF SEVERELY CONSTIPATED, try a Bowel Retraining Program: o Do not use laxatives.  o Eat a diet high in roughage, such as bran cereals and leafy vegetables.  o Drink six (6) ounces of prune or apricot juice each morning.  o Eat two (2) large servings of stewed fruit each day.  o Take one (1) heaping tablespoon of a psyllium-based bulking agent twice a day. Use sugar-free sweetener when possible to avoid excessive calories.  o Eat a normal breakfast.  o Set aside 15 minutes after breakfast to sit on the toilet, but do not strain to have a bowel movement.  o If you do not have a bowel movement by the third day, use an enema and repeat the above steps.   Controlling diarrhea o Switch to liquids and simpler foods for a few days to avoid stressing your intestines further. o Avoid dairy products (especially milk & ice cream) for a short time.  The intestines often can lose the ability to digest lactose when stressed. o Avoid foods that cause gassiness or bloating.  Typical foods include beans and other legumes, cabbage, broccoli, and dairy foods.  Every person has some sensitivity to other foods, so listen to our body and avoid those foods that trigger problems for you. o Adding fiber (Citrucel, Metamucil, psyllium, Miralax) gradually can help thicken stools by absorbing excess fluid and retrain the intestines to act more normally.  Slowly increase the dose over a few weeks.  Too much fiber too soon can backfire and cause cramping  & bloating. o Probiotics (such as active yogurt, Align, etc) may help repopulate the intestines and colon with normal bacteria and calm down a sensitive digestive tract.  Most studies show it to be of mild help, though, and such products can be costly. o Medicines: - Bismuth subsalicylate (ex. Kayopectate, Pepto Bismol) every 30 minutes for up to 6 doses can help control diarrhea.  Avoid if pregnant. - Loperamide (Immodium) can slow down diarrhea.  Start with two tablets (4mg  total) first and then try one tablet every 6 hours.  Avoid if you are having fevers or severe pain.  If you are not better or start feeling worse, stop all medicines and call your doctor for advice o Call your doctor if you are getting worse or not better.  Sometimes further testing (cultures, endoscopy, X-ray studies, bloodwork, etc) may be needed to help diagnose and treat the cause of the diarrhea.  TROUBLESHOOTING IRREGULAR BOWELS 1) Avoid extremes of bowel movements (no bad constipation/diarrhea) 2) Miralax 17gm mixed in 8oz. water or juice-daily. May use BID as needed.  3) Gas-x,Phazyme, etc. as needed for gas & bloating.  4) Soft,bland diet. No spicy,greasy,fried foods.  5) Prilosec over-the-counter as needed  6) May hold gluten/wheat products from diet to see if symptoms improve.  7)  May try probiotics (Align, Activa, etc) to help calm the bowels down 7) If symptoms become worse call back immediately.  Hernia, Adult A hernia is the bulging of an organ or tissue through a weak spot in the muscles of the abdomen (abdominal wall). Hernias develop most often near the navel or groin. There are many kinds of hernias. Common kinds include:  Femoral hernia. This kind of hernia develops under the groin in the upper thigh area.  Inguinal hernia. This kind of hernia develops in the groin or scrotum.  Umbilical hernia. This kind of hernia develops near the navel.  Hiatal hernia. This kind of hernia causes part of the  stomach to be pushed up into the chest.  Incisional hernia. This kind of hernia bulges through a scar from an abdominal surgery. CAUSES This condition may be caused by:  Heavy lifting.  Coughing over a long period of time.  Straining to have a bowel movement.  An incision made during an abdominal surgery.  A birth defect (congenital defect).  Excess weight or obesity.  Smoking.  Poor nutrition.  Cystic fibrosis.  Excess fluid in the abdomen.  Undescended testicles. SYMPTOMS Symptoms of a hernia include:  A lump on the abdomen. This is the first sign of a hernia. The lump may become more obvious with standing, straining, or coughing. It may get bigger over time if it is not treated or if the condition causing it is not treated.  Pain. A hernia is usually painless, but it may become painful over time if treatment is delayed. The pain is usually dull and may get worse with standing or lifting heavy objects. Sometimes a hernia gets tightly squeezed in the weak spot (strangulated) or stuck there (incarcerated) and causes additional symptoms. These symptoms may include:  Vomiting.  Nausea.  Constipation.  Irritability. DIAGNOSIS A hernia may be diagnosed with:  A physical exam. During the exam your health care provider may ask you to cough or to make a specific movement, because a hernia is usually more visible when you move.  Imaging tests. These can include:  X-rays.  Ultrasound.  CT scan. TREATMENT A hernia that is small and painless may not need to be treated. A hernia that is large or painful may be treated with surgery. Inguinal hernias may be treated with surgery to prevent incarceration or strangulation. Strangulated hernias are always treated with surgery, because lack of blood to the trapped organ or tissue can cause it to die. Surgery to treat a hernia involves pushing the bulge back into place and repairing the weak part of the abdomen. HOME CARE  INSTRUCTIONS  Avoid straining.  Do not lift anything heavier than 10 lb (4.5 kg).  Lift with your leg muscles, not your back muscles. This helps avoid strain.  When coughing, try to cough gently.  Prevent constipation. Constipation leads to straining with bowel movements, which can make a hernia worse or cause a hernia repair to break down. You can prevent constipation by:  Eating a high-fiber diet that includes plenty of fruits and vegetables.  Drinking enough fluids to keep your urine clear or pale yellow. Aim to drink 6-8 glasses of water per day.  Using a stool softener as directed by your health care provider.  Lose weight, if you are overweight.  Do not use any tobacco products, including cigarettes, chewing tobacco, or electronic cigarettes. If you need help quitting, ask your health care provider.  Keep all follow-up visits as directed by your health care provider. This is important. Your health care provider may need to monitor your condition. SEEK MEDICAL CARE IF:  You have swelling, redness, and pain in the affected area.  Your bowel habits change. SEEK IMMEDIATE MEDICAL CARE IF:  You have a fever.  You have abdominal pain that is getting worse.  You feel nauseous or you vomit.  You cannot push the hernia back in place by gently pressing on it while you are lying down.  The hernia:  Changes in shape or size.  Is stuck outside the abdomen.  Becomes discolored.  Feels hard or tender.  This information is not intended to replace advice given to you by your health care provider. Make sure you discuss any questions you have with your health care provider.   Document Released: 01/09/2005 Document Revised: 01/30/2014 Document Reviewed: 11/19/2013 [employer] Interactive Patient Education [employer].

## 2014-12-14 ENCOUNTER — Other Ambulatory Visit: Payer: Self-pay | Admitting: Family Medicine

## 2014-12-14 DIAGNOSIS — Z780 Asymptomatic menopausal state: Secondary | ICD-10-CM

## 2015-01-22 ENCOUNTER — Other Ambulatory Visit: Payer: Self-pay

## 2015-02-01 ENCOUNTER — Other Ambulatory Visit: Payer: Self-pay | Admitting: Pulmonary Disease

## 2015-02-22 ENCOUNTER — Other Ambulatory Visit: Payer: Self-pay

## 2015-02-25 DIAGNOSIS — M170 Bilateral primary osteoarthritis of knee: Secondary | ICD-10-CM | POA: Insufficient documentation

## 2015-03-04 ENCOUNTER — Other Ambulatory Visit: Payer: Self-pay

## 2015-03-08 ENCOUNTER — Other Ambulatory Visit: Payer: Self-pay

## 2015-03-08 DIAGNOSIS — L659 Nonscarring hair loss, unspecified: Secondary | ICD-10-CM | POA: Insufficient documentation

## 2015-03-22 ENCOUNTER — Other Ambulatory Visit: Payer: Self-pay | Admitting: Family Medicine

## 2015-03-22 DIAGNOSIS — E2839 Other primary ovarian failure: Secondary | ICD-10-CM

## 2015-03-24 ENCOUNTER — Other Ambulatory Visit: Payer: Self-pay

## 2015-03-26 ENCOUNTER — Other Ambulatory Visit: Payer: Self-pay

## 2015-04-09 ENCOUNTER — Ambulatory Visit: Payer: Self-pay | Admitting: Adult Health

## 2015-04-09 ENCOUNTER — Other Ambulatory Visit: Payer: Self-pay

## 2015-04-12 ENCOUNTER — Ambulatory Visit: Payer: Self-pay | Admitting: Adult Health

## 2015-04-12 ENCOUNTER — Other Ambulatory Visit: Payer: Self-pay

## 2015-04-16 ENCOUNTER — Ambulatory Visit: Payer: Self-pay | Admitting: Adult Health

## 2015-04-21 ENCOUNTER — Inpatient Hospital Stay: Admission: RE | Admit: 2015-04-21 | Payer: Self-pay | Source: Ambulatory Visit

## 2015-05-25 ENCOUNTER — Observation Stay (HOSPITAL_COMMUNITY)
Admission: EM | Admit: 2015-05-25 | Discharge: 2015-05-27 | Disposition: A | Discharge status: Home / Self Care | Payer: Medicare Other | Attending: Internal Medicine | Admitting: Internal Medicine

## 2015-05-25 ENCOUNTER — Encounter (HOSPITAL_COMMUNITY): Payer: Self-pay | Admitting: Emergency Medicine

## 2015-05-25 ENCOUNTER — Emergency Department (HOSPITAL_COMMUNITY): Payer: Medicare Other

## 2015-05-25 DIAGNOSIS — N183 Chronic kidney disease, stage 3 unspecified: Secondary | ICD-10-CM | POA: Diagnosis present

## 2015-05-25 DIAGNOSIS — R531 Weakness: Secondary | ICD-10-CM | POA: Diagnosis not present

## 2015-05-25 DIAGNOSIS — F411 Generalized anxiety disorder: Secondary | ICD-10-CM | POA: Diagnosis not present

## 2015-05-25 DIAGNOSIS — F1410 Cocaine abuse, uncomplicated: Secondary | ICD-10-CM | POA: Diagnosis not present

## 2015-05-25 DIAGNOSIS — Z87891 Personal history of nicotine dependence: Secondary | ICD-10-CM | POA: Insufficient documentation

## 2015-05-25 DIAGNOSIS — I2782 Chronic pulmonary embolism: Secondary | ICD-10-CM | POA: Insufficient documentation

## 2015-05-25 DIAGNOSIS — K219 Gastro-esophageal reflux disease without esophagitis: Secondary | ICD-10-CM | POA: Diagnosis not present

## 2015-05-25 DIAGNOSIS — N1832 Chronic kidney disease, stage 3b: Secondary | ICD-10-CM | POA: Diagnosis present

## 2015-05-25 DIAGNOSIS — N390 Urinary tract infection, site not specified: Secondary | ICD-10-CM | POA: Diagnosis not present

## 2015-05-25 DIAGNOSIS — I129 Hypertensive chronic kidney disease with stage 1 through stage 4 chronic kidney disease, or unspecified chronic kidney disease: Secondary | ICD-10-CM | POA: Diagnosis not present

## 2015-05-25 DIAGNOSIS — J449 Chronic obstructive pulmonary disease, unspecified: Secondary | ICD-10-CM | POA: Insufficient documentation

## 2015-05-25 DIAGNOSIS — E860 Dehydration: Secondary | ICD-10-CM

## 2015-05-25 DIAGNOSIS — K7460 Unspecified cirrhosis of liver: Secondary | ICD-10-CM | POA: Diagnosis not present

## 2015-05-25 DIAGNOSIS — I2699 Other pulmonary embolism without acute cor pulmonale: Principal | ICD-10-CM

## 2015-05-25 DIAGNOSIS — Z7901 Long term (current) use of anticoagulants: Secondary | ICD-10-CM | POA: Insufficient documentation

## 2015-05-25 DIAGNOSIS — F329 Major depressive disorder, single episode, unspecified: Secondary | ICD-10-CM | POA: Insufficient documentation

## 2015-05-25 DIAGNOSIS — Z720 Tobacco use: Secondary | ICD-10-CM | POA: Diagnosis present

## 2015-05-25 DIAGNOSIS — I2510 Atherosclerotic heart disease of native coronary artery without angina pectoris: Secondary | ICD-10-CM | POA: Insufficient documentation

## 2015-05-25 DIAGNOSIS — B1910 Unspecified viral hepatitis B without hepatic coma: Secondary | ICD-10-CM | POA: Diagnosis present

## 2015-05-25 DIAGNOSIS — I10 Essential (primary) hypertension: Secondary | ICD-10-CM | POA: Diagnosis present

## 2015-05-25 DIAGNOSIS — R109 Unspecified abdominal pain: Secondary | ICD-10-CM

## 2015-05-25 DIAGNOSIS — I82509 Chronic embolism and thrombosis of unspecified deep veins of unspecified lower extremity: Secondary | ICD-10-CM | POA: Diagnosis not present

## 2015-05-25 DIAGNOSIS — I82409 Acute embolism and thrombosis of unspecified deep veins of unspecified lower extremity: Secondary | ICD-10-CM | POA: Diagnosis present

## 2015-05-25 DIAGNOSIS — N179 Acute kidney failure, unspecified: Secondary | ICD-10-CM

## 2015-05-25 DIAGNOSIS — J9601 Acute respiratory failure with hypoxia: Secondary | ICD-10-CM

## 2015-05-25 HISTORY — DX: Chronic kidney disease, stage 3 (moderate): N18.3

## 2015-05-25 HISTORY — DX: Chronic kidney disease, stage 3 unspecified: N18.30

## 2015-05-25 LAB — COMPREHENSIVE METABOLIC PANEL
ALT: 17 U/L (ref 14–54)
AST: 28 U/L (ref 15–41)
Albumin: 4.1 g/dL (ref 3.5–5.0)
Alkaline Phosphatase: 82 U/L (ref 38–126)
Anion gap: 12 (ref 5–15)
BUN: 14 mg/dL (ref 6–20)
CHLORIDE: 101 mmol/L (ref 101–111)
CO2: 26 mmol/L (ref 22–32)
CREATININE: 1.28 mg/dL — AB (ref 0.44–1.00)
Calcium: 9.7 mg/dL (ref 8.9–10.3)
GFR calc Af Amer: 52 mL/min — ABNORMAL LOW (ref >60)
GFR calc non Af Amer: 45 mL/min — ABNORMAL LOW (ref >60)
Glucose, Bld: 100 mg/dL — ABNORMAL HIGH (ref 65–99)
Potassium: 4.9 mmol/L (ref 3.5–5.1)
SODIUM: 139 mmol/L (ref 135–145)
Total Bilirubin: 1.8 mg/dL — ABNORMAL HIGH (ref 0.3–1.2)
Total Protein: 7.1 g/dL (ref 6.5–8.1)

## 2015-05-25 LAB — CBC WITH DIFFERENTIAL/PLATELET
BASOS ABS: 0 10*3/uL (ref 0.0–0.1)
BASOS PCT: 0 %
EOS PCT: 1 %
Eosinophils Absolute: 0.1 10*3/uL (ref 0.0–0.7)
HCT: 42.3 % (ref 36.0–46.0)
Hemoglobin: 14.3 g/dL (ref 12.0–15.0)
LYMPHS PCT: 27 %
Lymphs Abs: 1.8 10*3/uL (ref 0.7–4.0)
MCH: 30.9 pg (ref 26.0–34.0)
MCHC: 33.8 g/dL (ref 30.0–36.0)
MCV: 91.4 fL (ref 78.0–100.0)
MONO ABS: 0.5 10*3/uL (ref 0.1–1.0)
Monocytes Relative: 7 %
Neutro Abs: 4.4 10*3/uL (ref 1.7–7.7)
Neutrophils Relative %: 65 %
PLATELETS: 109 10*3/uL — AB (ref 150–400)
RBC: 4.63 MIL/uL (ref 3.87–5.11)
RDW: 13 % (ref 11.5–15.5)
WBC: 6.7 10*3/uL (ref 4.0–10.5)

## 2015-05-25 LAB — URINE MICROSCOPIC-ADD ON

## 2015-05-25 LAB — URINALYSIS, ROUTINE W REFLEX MICROSCOPIC
Glucose, UA: NEGATIVE mg/dL
HGB URINE DIPSTICK: NEGATIVE
Ketones, ur: 15 mg/dL — AB
NITRITE: NEGATIVE
PH: 5.5 (ref 5.0–8.0)
Protein, ur: NEGATIVE mg/dL
SPECIFIC GRAVITY, URINE: 1.024 (ref 1.005–1.030)

## 2015-05-25 LAB — I-STAT CG4 LACTIC ACID, ED
Lactic Acid, Venous: 0.67 mmol/L (ref 0.5–2.0)
Lactic Acid, Venous: 0.66 mmol/L (ref 0.5–2.0)

## 2015-05-25 MED ORDER — OXYCODONE-ACETAMINOPHEN 5-325 MG PO TABS
ORAL_TABLET | ORAL | Status: AC
  Filled 2015-05-25: qty 1

## 2015-05-25 MED ORDER — HYDROMORPHONE HCL 1 MG/ML IJ SOLN
1.0000 mg | Freq: Once | INTRAMUSCULAR | Status: AC
  Administered 2015-05-25: 1 mg via INTRAVENOUS
  Filled 2015-05-25: qty 1

## 2015-05-25 MED ORDER — ONDANSETRON HCL 4 MG/2ML IJ SOLN
4.0000 mg | Freq: Once | INTRAMUSCULAR | Status: AC
  Administered 2015-05-25: 4 mg via INTRAVENOUS
  Filled 2015-05-25: qty 2

## 2015-05-25 MED ORDER — IOPAMIDOL (ISOVUE-370) INJECTION 76%
INTRAVENOUS | Status: AC
  Administered 2015-05-25: 80 mL
  Filled 2015-05-25: qty 100

## 2015-05-25 MED ORDER — OXYCODONE-ACETAMINOPHEN 5-325 MG PO TABS
1.0000 | ORAL_TABLET | ORAL | Status: AC | PRN
  Administered 2015-05-25 – 2015-05-26 (×2): 1 via ORAL
  Filled 2015-05-25: qty 1

## 2015-05-25 MED ORDER — SODIUM CHLORIDE 0.9 % IV BOLUS (SEPSIS)
1000.0000 mL | Freq: Once | INTRAVENOUS | Status: AC
  Administered 2015-05-25: 1000 mL via INTRAVENOUS

## 2015-05-25 MED ORDER — IPRATROPIUM-ALBUTEROL 0.5-2.5 (3) MG/3ML IN SOLN
3.0000 mL | Freq: Once | RESPIRATORY_TRACT | Status: AC
  Administered 2015-05-25: 3 mL via RESPIRATORY_TRACT
  Filled 2015-05-25: qty 3

## 2015-05-25 NOTE — ED Provider Notes (Signed)
CSN: [id number]     Arrival date & time 05/25/15  1348 History   First MD Initiated Contact with Patient 05/25/15 1907     Chief Complaint  Patient presents with  . Flank Pain     (Consider location/radiation/quality/duration/timing/severity/associated sxs/prior Treatment) HPI   60 year old female with extensive past medical history including COPD, history of DVT/PE, currently off of several toe, history of recurrent pneumonia, who presents with acute onset of right flank pain. The patient states that approximately one hour ago, she experienced acute onset of right flank pain. She describes the pain as a sharp, stabbing pain in her right flank that radiates towards her middle abdomen. The pain is made worse with position changes but also with deep inspiration. She has a chronic cough that is not acutely worsen. Denies any hemoptysis. Denies any lower extremity swelling. She states she was previously on Xarelto but was advised to stop taking this by her physician. She has had some urinary frequency as well but no overt dysuria. No fevers, nausea, vomiting. No chest pain.  Past Medical History  Diagnosis Date  . COPD (chronic obstructive pulmonary disease) ([place])   . Cirrhosis ([place])   . Pulmonary emboli ([place]) 09/2013  . Kidney stones   . Heart murmur   . DVT (deep venous thrombosis) ([place]) 09/2013    RLE  . Pneumonia     "4 times in the past year" (10/30/2013)  . History of blood transfusion     "due to excessive blood loss before hysterectomy"  . GERD (gastroesophageal reflux disease)   . Hepatitis B   . Arthritis     "knees; lower back" (10/30/2013)  . Anxiety   . Depression   . Bipolar disorder ([place])   . Hypertension     currently on no medication   . Shortness of breath dyspnea     increased exertion;exercise  . Agoraphobia with panic attacks   . History of urinary tract infection   . Urinary incontinence   . Urinary frequency   . Altered mental state 10/26/2013  . Ankle  fracture, left 11/27/2010    S/p ORIF 11/2   . Cavitary pneumonia   . CKD (chronic kidney disease), stage III    Past Surgical History  Procedure Laterality Date  . Carpal tunnel release Left   . Fracture surgery    . Ankle fracture surgery Left   . Abdominal hysterectomy    . Tubal ligation    . Dilation and curettage of uterus  X 2  . Hand surgery      1980's/left hand   . Laparoscopic assisted ventral hernia repair N/A 12/10/2014    Procedure: LAPAROSCOPIC ASSISTED REPAIR OF INCARCERATED UMBILICAL HERNIA ;  Surgeon: Michael Boston, MD;  Location: [facility];  Service: General;  Laterality: N/A;  . Insertion of mesh N/A 12/10/2014    Procedure: INSERTION OF MESH;  Surgeon: Michael Boston, MD;  Location: [facility];  Service: General;  Laterality: N/A;   Family History  Problem Relation Age of Onset  . Stroke Mother   . Lung cancer Father    Social History  Substance Use Topics  . Smoking status: Former Smoker -- 1.00 packs/day for 35 years    Types: Cigarettes    Quit date: 10/26/2013  . Smokeless tobacco: Never Used  . Alcohol Use: No     Comment: "stopped drinking  in 2004"   OB History    No data available     Review of Systems  Constitutional: Negative for fever, chills and fatigue.  HENT: Negative for congestion and rhinorrhea.   Eyes: Negative for visual disturbance.  Respiratory: Negative for cough, shortness of breath and wheezing.   Cardiovascular: Positive for chest pain. Negative for leg swelling.  Gastrointestinal: Positive for abdominal pain. Negative for nausea, vomiting and diarrhea.  Genitourinary: Positive for flank pain. Negative for dysuria and frequency.  Musculoskeletal: Negative for neck pain and neck stiffness.  Skin: Negative for rash.  Allergic/Immunologic: Negative for immunocompromised state.  Neurological: Negative for syncope, weakness and headaches.      Allergies  Heparin; Cymbalta; Elavil; Gabapentin; and Neomycin-bacitracin  zn-polymyx  Home Medications   Prior to Admission medications   Medication Sig Start Date End Date Taking? Authorizing Provider  ALPRAZolam Duanne Moron) 1 MG tablet Take 1 mg by mouth 3 (three) times daily as needed for anxiety.   Yes Historical Provider, MD  citalopram (CELEXA) 20 MG tablet Take 20 mg by mouth at bedtime.   Yes Historical Provider, MD  entecavir (BARACLUDE) 0.5 MG tablet Take 0.5 mg by mouth every other day.    Yes Historical Provider, MD  etodolac (LODINE) 400 MG tablet Take 1 tablet by mouth 2 (two) times daily as needed (pain).  08/24/14  Yes Historical Provider, MD  Iloperidone (FANAPT) 6 MG TABS Take 12 mg by mouth every morning.    Yes Historical Provider, MD  pantoprazole (PROTONIX) 40 MG tablet Take 40 mg by mouth daily. 12/23/13  Yes Historical Provider, MD  PROAIR HFA 108 (90 Base) MCG/ACT inhaler INHALE 2 PUFFS INTO THE LUNGS EVERY 6 HOURS AS NEEDED FOR WHEEZING OR SHORTNESS OF BREATH. 02/01/15  Yes Rigoberto Noel, MD  spironolactone (ALDACTONE) 50 MG tablet Take 50 mg by mouth 2 (two) times daily. 12/16/13  Yes Historical Provider, MD  temazepam (RESTORIL) 30 MG capsule Take 30 mg by mouth at bedtime. 09/14/14  Yes Historical Provider, MD  traMADol (ULTRAM) 50 MG tablet Take 1-2 tablets (50-100 mg total) by mouth every 6 (six) hours as needed for moderate pain or severe pain. 12/10/14  Yes Michael Boston, MD  valACYclovir (VALTREX) 500 MG tablet TAKE 1 TABLET (500 MG TOTAL) BY MOUTH TWO (2) TIMES A DAY. FOR 7 DAYS FOR COLD SORES 10/22/14  Yes Historical Provider, MD  oxyCODONE (OXY IR/ROXICODONE) 5 MG immediate release tablet Take 1-2 tablets (5-10 mg total) by mouth every 6 (six) hours as needed for moderate pain, severe pain or breakthrough pain. Patient not taking: Reported on 05/25/2015 12/11/14   Michael Boston, MD  rivaroxaban (XARELTO) 20 MG TABS tablet Take 1 tablet (20 mg total) by mouth daily with supper. Patient not taking: Reported on 05/25/2015 12/12/14   Michael Boston, MD   Spacer/Aero-Holding Chambers (AEROCHAMBER MV) inhaler Use as instructed 10/08/14   Rigoberto Noel, MD   BP 115/68 mmHg  Pulse 66  Temp(Src) 99.6 F (37.6 C) (Oral)  Resp 18  Ht 5\' 4"  (1.626 m)  Wt 104.418 kg  BMI 39.49 kg/m2  SpO2 95% Physical Exam  Constitutional: She appears well-developed and well-nourished. No distress.  HENT:  Head: Normocephalic.  Mouth/Throat: No oropharyngeal exudate.  Eyes: Conjunctivae are normal. Pupils are equal, round, and reactive to light.  Neck: Normal range of motion. Neck supple.  Cardiovascular: Normal rate, regular rhythm, normal heart sounds and intact distal pulses.  Exam reveals no friction rub.   No murmur heard. Pulmonary/Chest: Effort normal. No respiratory distress. She has no wheezes. She has no rales.  Abdominal: Soft. Bowel  sounds are normal. She exhibits no distension. Tenderness: Moderate tenderness at right mid flank. No rebound or guarding. No overt CVA tenderness. There is no rebound and no guarding.  Musculoskeletal: She exhibits no edema.  Neurological: She is alert.  Skin: Skin is warm. No rash noted.  Nursing note and vitals reviewed.   ED Course  Procedures (including critical care time) Labs Review Labs Reviewed  URINALYSIS, ROUTINE W REFLEX MICROSCOPIC (NOT AT [facility]) - Abnormal; Notable for the following:    Color, Urine AMBER (*)    APPearance HAZY (*)    Bilirubin Urine SMALL (*)    Ketones, ur 15 (*)    Leukocytes, UA MODERATE (*)    All other components within normal limits  URINE MICROSCOPIC-ADD ON - Abnormal; Notable for the following:    Squamous Epithelial / LPF 6-30 (*)    Bacteria, UA FEW (*)    All other components within normal limits  CBC WITH DIFFERENTIAL/PLATELET - Abnormal; Notable for the following:    Platelets 109 (*)    All other components within normal limits  COMPREHENSIVE METABOLIC PANEL - Abnormal; Notable for the following:    Glucose, Bld 100 (*)    Creatinine, Ser 1.28 (*)    Total  Bilirubin 1.8 (*)    GFR calc non Af Amer 45 (*)    GFR calc Af Amer 52 (*)    All other components within normal limits  PROTIME-INR - Abnormal; Notable for the following:    Prothrombin Time 17.0 (*)    All other components within normal limits  CBC - Abnormal; Notable for the following:    Platelets 103 (*)    All other components within normal limits  URINE CULTURE  MRSA PCR SCREENING  BRAIN NATRIURETIC PEPTIDE  TROPONIN I  URINE RAPID DRUG SCREEN, HOSP PERFORMED  BASIC METABOLIC PANEL  I-STAT CG4 LACTIC ACID, ED  I-STAT CG4 LACTIC ACID, ED    Imaging Review Ct Angio Chest Pe W/cm &/or Wo Cm  05/26/2015  CLINICAL DATA:  Dyspnea. EXAM: CT ANGIOGRAPHY CHEST WITH CONTRAST TECHNIQUE: Multidetector CT imaging of the chest was performed using the standard protocol during bolus administration of intravenous contrast. Multiplanar CT image reconstructions and MIPs were obtained to evaluate the vascular anatomy. CONTRAST:  100 mL Isovue 300 intravenous COMPARISON:  01/28/2014 FINDINGS: Cardiovascular: There is good opacification of pulmonary vasculature. There are pulmonary emboli within the lower lobe arteries of both lungs. There is enlargement of the central left pulmonary artery, suggesting pulmonary hypertension. There are findings suggesting chronic pulmonary emboli, with vessel wall thickening and some eccentric thrombus. However, there also is acute pulmonary embolus with a few saddle emboli across bifurcations distally. The thoracic aorta is normal in caliber and intact. Lungs: Peripheral airspace opacity in the right lower lobe could represent pulmonary infarction. Infectious infiltrate or atelectasis also possible. Chronic scarring in the right apex. Mild scattered mosaic attenuation suggests air trapping. Central airways: Patent Effusions: Trace right pleural effusion. Lymphadenopathy: None Esophagus: Unremarkable Upper abdomen: Cirrhotic liver, incompletely imaged. Musculoskeletal: No  significant abnormality Review of the MIP images confirms the above findings. IMPRESSION: The study is positive for pulmonary embolism, probably acute on chronic. There are findings suggesting pulmonary arterial hypertension. No evidence of right heart strain. Possible pulmonary infarction in the right lower lobe periphery, although this could also represent atelectasis or infection. Scarring in the right apex. Cirrhosis. These results were called by telephone at the time of interpretation on 05/26/2015 at 12:10 am to Dr.  Duffy Bruce , who verbally acknowledged these results. Electronically Signed   By: Andreas Newport M.D.   On: 05/26/2015 00:15   Ct Renal Stone Study  05/25/2015  CLINICAL DATA:  Right flank pain since noon today. EXAM: CT ABDOMEN AND PELVIS WITHOUT CONTRAST TECHNIQUE: Multidetector CT imaging of the abdomen and pelvis was performed following the standard protocol without IV contrast. COMPARISON:  10/04/2013 FINDINGS: Small bilateral pleural effusions with atelectasis in the lung bases. Small pericardial effusion. Multiple bilateral intrarenal stones are demonstrated. Largest is in the left upper pole and measures about 5 mm. No hydronephrosis or hydroureter. No ureteral stones or bladder stones. No bladder wall thickening. Low-attenuation lesion in the midpole of the right kidney likely representing a cyst. No change since previous study. Changes of hepatic cirrhosis with nodular hepatic contour and enlargement of the lateral segment left lobe. Mild prominence of lymph nodes in the celiac axis and porta hepatis are likely related to cirrhosis. Spleen is mildly enlarged. Mild splenic varices. The unenhanced appearance of the gallbladder, pancreas, adrenal glands, abdominal aorta, inferior vena cava, and retroperitoneal lymph nodes is unremarkable. Small diverticulum of the second portion of the duodenum. Stomach, small bowel, and colon are not abnormally distended. Scattered stool in the  colon. No free air or free fluid in the abdomen. Pelvis: The appendix is normal. Uterus is surgically absent. No free or loculated pelvic fluid collections. No pelvic mass or lymphadenopathy. Degenerative changes in the spine. No destructive bone lesions. IMPRESSION: Multiple bilateral nonobstructing intrarenal stones. No hydronephrosis or hydroureter. No ureteral stones. Changes of hepatic cirrhosis with splenic enlargement. Small bilateral pleural effusions with infiltration or atelectasis in the lung bases. Small pericardial effusion. Electronically Signed   By: Lucienne Capers M.D.   On: 05/25/2015 21:17   I have personally reviewed and evaluated these images and lab results as part of my medical decision-making.   EKG Interpretation None       EKG: NSR, normal intervals. No RSR' or RBBB. No S1Q3T3. No acute ST segment changes. No EKG evidence of acute ischemia or infarct.  MDM   60 yo F with PMHx of DVT/PE, now off Xarelto per her PCP, h/o COPD, HTN, CKD who presents with acute onset right flank pain. On arrival, VSS and WNL. Exam as above. Initial DDx includes: pyelonephritis, renal colic, costochondritis, PNA, pleural effusion, must also consider PE though no tachypnea on exam. Will check CT renal stone, labs, and monitor. IV analgesia, IVF given. Pt is o/w without signs of systemic infection.  CT stone study shows no acute abnormality with no obstructing stones. Pt does have b/l pleural effusions. Labs as above, remarkable for mild AKI, likely pre-renal with ketones on UA. Pt has persistent R flank pain and is intermittently hypoxic. Will give IVF, obtain CTA PE as benefits outweigh risks at this time.  CTA PE shows b/l pulmonary embolisms. Pt remains HDS without signs of submassive or massive PE. Will add on troponin, BNP and admit to medicine. Medicine in agreement. Pt HDS.  Clinical Impression: 1. PE (pulmonary thromboembolism) ([place])   2. Right flank pain   3. PE (pulmonary  embolism)   4. AKI (acute kidney injury) ([place])   5. Dehydration   6. Acute respiratory failure with hypoxemia ([facility])     Disposition: Admit  Condition: Stable  Pt seen in conjunction with Dr. Unice Bailey, MD 05/26/15 [id number]  Tanna Furry, MD 05/31/15 0040

## 2015-05-25 NOTE — ED Notes (Signed)
Pt reports right flank pain radiating around her side starting 1 hour ago. Pt alert x4. NAD at this time.

## 2015-05-25 NOTE — ED Notes (Signed)
MD at bedside. 

## 2015-05-25 NOTE — ED Notes (Signed)
Patient transported to CT 

## 2015-05-26 ENCOUNTER — Observation Stay (HOSPITAL_BASED_OUTPATIENT_CLINIC_OR_DEPARTMENT_OTHER): Payer: Medicare Other

## 2015-05-26 ENCOUNTER — Encounter (HOSPITAL_COMMUNITY): Payer: Self-pay | Admitting: Internal Medicine

## 2015-05-26 DIAGNOSIS — B169 Acute hepatitis B without delta-agent and without hepatic coma: Secondary | ICD-10-CM

## 2015-05-26 DIAGNOSIS — R10A1 Flank pain, right side: Secondary | ICD-10-CM | POA: Insufficient documentation

## 2015-05-26 DIAGNOSIS — N183 Chronic kidney disease, stage 3 (moderate): Secondary | ICD-10-CM

## 2015-05-26 DIAGNOSIS — I2699 Other pulmonary embolism without acute cor pulmonale: Principal | ICD-10-CM

## 2015-05-26 DIAGNOSIS — F411 Generalized anxiety disorder: Secondary | ICD-10-CM | POA: Diagnosis not present

## 2015-05-26 DIAGNOSIS — I10 Essential (primary) hypertension: Secondary | ICD-10-CM | POA: Diagnosis not present

## 2015-05-26 DIAGNOSIS — N390 Urinary tract infection, site not specified: Secondary | ICD-10-CM | POA: Diagnosis present

## 2015-05-26 DIAGNOSIS — Z720 Tobacco use: Secondary | ICD-10-CM

## 2015-05-26 DIAGNOSIS — R109 Unspecified abdominal pain: Secondary | ICD-10-CM | POA: Insufficient documentation

## 2015-05-26 LAB — CBC
HCT: 36.4 % (ref 36.0–46.0)
HEMOGLOBIN: 12 g/dL (ref 12.0–15.0)
MCH: 29.4 pg (ref 26.0–34.0)
MCHC: 33 g/dL (ref 30.0–36.0)
MCV: 89.2 fL (ref 78.0–100.0)
Platelets: 103 10*3/uL — ABNORMAL LOW (ref 150–400)
RBC: 4.08 MIL/uL (ref 3.87–5.11)
RDW: 12.8 % (ref 11.5–15.5)
WBC: 5.4 10*3/uL (ref 4.0–10.5)

## 2015-05-26 LAB — RAPID URINE DRUG SCREEN, HOSP PERFORMED
Amphetamines: NOT DETECTED
BENZODIAZEPINES: POSITIVE — AB
Barbiturates: NOT DETECTED
COCAINE: NOT DETECTED
OPIATES: POSITIVE — AB
TETRAHYDROCANNABINOL: NOT DETECTED

## 2015-05-26 LAB — BASIC METABOLIC PANEL
Anion gap: 9 (ref 5–15)
BUN: 13 mg/dL (ref 6–20)
CALCIUM: 9 mg/dL (ref 8.9–10.3)
CO2: 24 mmol/L (ref 22–32)
CREATININE: 1.28 mg/dL — AB (ref 0.44–1.00)
Chloride: 103 mmol/L (ref 101–111)
GFR calc Af Amer: 52 mL/min — ABNORMAL LOW (ref >60)
GFR calc non Af Amer: 45 mL/min — ABNORMAL LOW (ref >60)
GLUCOSE: 144 mg/dL — AB (ref 65–99)
Potassium: 3.6 mmol/L (ref 3.5–5.1)
Sodium: 136 mmol/L (ref 135–145)

## 2015-05-26 LAB — PROTIME-INR
INR: 1.37 (ref 0.00–1.49)
Prothrombin Time: 17 seconds — ABNORMAL HIGH (ref 11.6–15.2)

## 2015-05-26 LAB — ECHOCARDIOGRAM COMPLETE
HEIGHTINCHES: 64 in
Weight: 3683.2 oz

## 2015-05-26 LAB — GLUCOSE, CAPILLARY: Glucose-Capillary: 131 mg/dL — ABNORMAL HIGH (ref 65–99)

## 2015-05-26 LAB — BRAIN NATRIURETIC PEPTIDE: B Natriuretic Peptide: 8.5 pg/mL (ref 0.0–100.0)

## 2015-05-26 LAB — TROPONIN I

## 2015-05-26 LAB — MRSA PCR SCREENING: MRSA by PCR: NEGATIVE

## 2015-05-26 MED ORDER — TEMAZEPAM 15 MG PO CAPS
30.0000 mg | ORAL_CAPSULE | Freq: Every day | ORAL | Status: DC
  Administered 2015-05-26 (×2): 30 mg via ORAL
  Filled 2015-05-26 (×2): qty 2

## 2015-05-26 MED ORDER — IPRATROPIUM-ALBUTEROL 0.5-2.5 (3) MG/3ML IN SOLN: 3.0000 mL | RESPIRATORY_TRACT | Status: DC | PRN

## 2015-05-26 MED ORDER — ONDANSETRON HCL 4 MG/2ML IJ SOLN: 4.0000 mg | Freq: Three times a day (TID) | INTRAMUSCULAR | Status: DC | PRN

## 2015-05-26 MED ORDER — NICOTINE 21 MG/24HR TD PT24
21.0000 mg | MEDICATED_PATCH | Freq: Every day | TRANSDERMAL | Status: DC
  Filled 2015-05-26: qty 1

## 2015-05-26 MED ORDER — HYDRALAZINE HCL 20 MG/ML IJ SOLN: 5.0000 mg | INTRAMUSCULAR | Status: DC | PRN

## 2015-05-26 MED ORDER — RIVAROXABAN 20 MG PO TABS
20.0000 mg | ORAL_TABLET | Freq: Every day | ORAL | Status: DC
Start: 2015-06-16 — End: 2015-05-27

## 2015-05-26 MED ORDER — DEXTROSE 5 % IV SOLN
1.0000 g | INTRAVENOUS | Status: DC
  Administered 2015-05-26 (×2): 1 g via INTRAVENOUS
  Filled 2015-05-26 (×3): qty 10

## 2015-05-26 MED ORDER — ALBUTEROL SULFATE (2.5 MG/3ML) 0.083% IN NEBU: 2.5000 mg | INHALATION_SOLUTION | RESPIRATORY_TRACT | Status: DC | PRN

## 2015-05-26 MED ORDER — RIVAROXABAN 15 MG PO TABS
15.0000 mg | ORAL_TABLET | Freq: Two times a day (BID) | ORAL | Status: DC
  Administered 2015-05-26 – 2015-05-27 (×4): 15 mg via ORAL
  Filled 2015-05-26 (×5): qty 1

## 2015-05-26 MED ORDER — SODIUM CHLORIDE 0.9 % IV SOLN
INTRAVENOUS | Status: DC
  Administered 2015-05-26 – 2015-05-27 (×3): via INTRAVENOUS

## 2015-05-26 MED ORDER — OXYCODONE-ACETAMINOPHEN 5-325 MG PO TABS: 2.0000 | ORAL_TABLET | Freq: Four times a day (QID) | ORAL | Status: DC | PRN

## 2015-05-26 MED ORDER — OXYCODONE HCL 5 MG PO TABS
10.0000 mg | ORAL_TABLET | ORAL | Status: DC | PRN
  Administered 2015-05-26 – 2015-05-27 (×7): 10 mg via ORAL
  Filled 2015-05-26 (×7): qty 2

## 2015-05-26 MED ORDER — ENTECAVIR 0.5 MG PO TABS: 0.5000 mg | ORAL_TABLET | ORAL | Status: DC

## 2015-05-26 MED ORDER — ACETAMINOPHEN 325 MG PO TABS
650.0000 mg | ORAL_TABLET | Freq: Four times a day (QID) | ORAL | Status: DC | PRN
  Administered 2015-05-26: 650 mg via ORAL
  Filled 2015-05-26: qty 2

## 2015-05-26 MED ORDER — ILOPERIDONE 6 MG PO TABS: 12.0000 mg | ORAL_TABLET | Freq: Every morning | ORAL | Status: DC

## 2015-05-26 MED ORDER — CITALOPRAM HYDROBROMIDE 20 MG PO TABS
20.0000 mg | ORAL_TABLET | Freq: Every day | ORAL | Status: DC
  Administered 2015-05-26 (×2): 20 mg via ORAL
  Filled 2015-05-26 (×2): qty 1

## 2015-05-26 MED ORDER — ALPRAZOLAM 0.5 MG PO TABS
1.0000 mg | ORAL_TABLET | Freq: Three times a day (TID) | ORAL | Status: DC | PRN
  Administered 2015-05-26 – 2015-05-27 (×4): 1 mg via ORAL
  Filled 2015-05-26: qty 2
  Filled 2015-05-26: qty 4
  Filled 2015-05-26 (×2): qty 2

## 2015-05-26 MED ORDER — PANTOPRAZOLE SODIUM 40 MG PO TBEC
40.0000 mg | DELAYED_RELEASE_TABLET | Freq: Every day | ORAL | Status: DC
  Administered 2015-05-26 – 2015-05-27 (×2): 40 mg via ORAL
  Filled 2015-05-26 (×2): qty 1

## 2015-05-26 NOTE — Progress Notes (Signed)
PT Cancellation Note  Patient Details Name: [name] MRN: [id number] DOB: [birth year]   Cancelled Treatment:    Reason Eval/Treat Not Completed: Medical issues which prohibited therapy (discovered acute on chronic PE).  Will hold PT for now until pt more medically appropriate. PT will continue to follow acutely.  Collie Siad PT, DPT  Pager: [phone] Phone: [phone] 05/26/2015, 9:32 AM

## 2015-05-26 NOTE — H&P (Signed)
History and Physical    [name] [id number] DOB: [birth year] DOA: 05/25/2015  Referring MD/NP/PA:   PCP: Marijean Bravo, MD   Outpatient Specialists: Pulmonologist, Dr. Bethann Goo  Patient coming from:  Home    Chief Complaint: SOB and right flank pain  HPI: [name] is a 60 y.o. female with medical history significant of DVT, PE not compliance to Xarelto, COPD, GERD, depression, anxiety, HIT, HBV, cocaine abuse, tobacco abuse, chronic kidney disease-stage III, cavitary pneumonia, who presents with right flank pain and shortness of breath.  Patient reports that she started have sudden onset right flank pain at about a 12 PM. It is located in the right flank area above the rib cage, constant, 10 out of 10 in severity, sharp, nonradiating. It is not aggravated or alleviated with any normal factors. It is associated with shortness of breath. She has chronic cough due to COPD, with little yellow color sputum production. Patient does not have fever, chills, chest pain. No tenderness over calf areas. Patient states that she has history of DVT and pulmonary embolism. She used to take Xarelto, but stopped taking it since 08/24/14 after she had a dental procedure. She states that she was supposed to restart Xarelto after her dental procedure, but she did not. Patient states that she has a urinary frequency and burning on urination, but no dysuria. No nausea, vomiting, diarrhea or abdominal pain. No unilateral weakness.  ED Course: pt was found to have positive urinalysis with moderate leukocytes, WBC 6.7, temperature normal, oxygen desaturation to 88% on room air, lactate 0.67, stable renal function. Pt is placed to tele bed for observation.  # CTA of chest showed pulmonary embolism, probably acute on chronic. There are findings suggesting pulmonary arterial hypertension. No evidence of right heart strain. Possible pulmonary infarction in the right lower lobe periphery, although this could also  represent atelectasis or infection. Scarring in the right apex. Cirrhosis.   # CT-renal stone study showed multiple bilateral nonobstructing intrarenal stones. No hydronephrosis or hydroureter. No ureteral stones. Changes of hepatic cirrhosis with splenic enlargement. Small bilateral pleural  effusions with infiltration or atelectasis in the lung bases. Small pericardial effusion.  Review of Systems:   General: no fevers, chills, no changes in body weight, has poor appetite, has fatigue HEENT: no blurry vision, hearing changes or sore throat Pulm: has dyspnea, coughing, no wheezing CV: no chest pain, no palpitations Abd: no nausea, vomiting, abdominal pain, diarrhea, constipation GU: no dysuria, has burning on urination, increased urinary frequency, no hematuria  Ext: no leg edema Neuro: no unilateral weakness, numbness, or tingling, no vision change or hearing loss Skin: no rash MSK: No muscle spasm, no deformity, no limitation of range of movement in spin. Has right flank pain. Heme: No easy bruising.  Travel history: No recent long distant travel.  Allergy:  Allergies  Allergen Reactions  . Heparin Other (See Comments)    HIT ab positive with elevated OD  . Cymbalta [Duloxetine Hcl] Other (See Comments)    headaches  . Elavil [Amitriptyline] Other (See Comments)    Causes her to be very "disoriented".  . Gabapentin Other (See Comments)    REACTION: rash in mouth  . Neomycin-Bacitracin Zn-Polymyx Rash    Past Medical History  Diagnosis Date  . COPD (chronic obstructive pulmonary disease) ([place])   . Cirrhosis ([place])   . Pulmonary emboli ([place]) 09/2013  . Kidney stones   . Heart murmur   . DVT (deep venous thrombosis) ([place]) 09/2013  RLE  . Pneumonia     "4 times in the past year" (10/30/2013)  . History of blood transfusion     "due to excessive blood loss before hysterectomy"  . GERD (gastroesophageal reflux disease)   . Hepatitis B   . Arthritis     "knees; lower back"  (10/30/2013)  . Anxiety   . Depression   . Bipolar disorder ([place])   . Hypertension     currently on no medication   . Shortness of breath dyspnea     increased exertion;exercise  . Agoraphobia with panic attacks   . History of urinary tract infection   . Urinary incontinence   . Urinary frequency   . Altered mental state 10/26/2013  . Ankle fracture, left 11/27/2010    S/p ORIF 11/2   . Cavitary pneumonia   . CKD (chronic kidney disease), stage III     Past Surgical History  Procedure Laterality Date  . Carpal tunnel release Left   . Fracture surgery    . Ankle fracture surgery Left   . Abdominal hysterectomy    . Tubal ligation    . Dilation and curettage of uterus  X 2  . Hand surgery      1980's/left hand   . Laparoscopic assisted ventral hernia repair N/A 12/10/2014    Procedure: LAPAROSCOPIC ASSISTED REPAIR OF INCARCERATED UMBILICAL HERNIA ;  Surgeon: Michael Boston, MD;  Location: [facility];  Service: General;  Laterality: N/A;  . Insertion of mesh N/A 12/10/2014    Procedure: INSERTION OF MESH;  Surgeon: Michael Boston, MD;  Location: [facility];  Service: General;  Laterality: N/A;    Social History:  reports that she quit smoking about 18 months ago. Her smoking use included Cigarettes. She has a 35 pack-year smoking history. She has never used smokeless tobacco. She reports that she does not drink alcohol or use illicit drugs.  Family History:  Family History  Problem Relation Age of Onset  . Stroke Mother   . Lung cancer Father      Prior to Admission medications   Medication Sig Start Date End Date Taking? Authorizing Provider  ALPRAZolam Duanne Moron) 1 MG tablet Take 1 mg by mouth 3 (three) times daily as needed for anxiety.   Yes Historical Provider, MD  citalopram (CELEXA) 20 MG tablet Take 20 mg by mouth at bedtime.   Yes Historical Provider, MD  entecavir (BARACLUDE) 0.5 MG tablet Take 0.5 mg by mouth every other day.    Yes Historical Provider, MD  etodolac (LODINE)  400 MG tablet Take 1 tablet by mouth 2 (two) times daily as needed (pain).  08/24/14  Yes Historical Provider, MD  Iloperidone (FANAPT) 6 MG TABS Take 12 mg by mouth every morning.    Yes Historical Provider, MD  pantoprazole (PROTONIX) 40 MG tablet Take 40 mg by mouth daily. 12/23/13  Yes Historical Provider, MD  PROAIR HFA 108 (90 Base) MCG/ACT inhaler INHALE 2 PUFFS INTO THE LUNGS EVERY 6 HOURS AS NEEDED FOR WHEEZING OR SHORTNESS OF BREATH. 02/01/15  Yes Rigoberto Noel, MD  spironolactone (ALDACTONE) 50 MG tablet Take 50 mg by mouth 2 (two) times daily. 12/16/13  Yes Historical Provider, MD  temazepam (RESTORIL) 30 MG capsule Take 30 mg by mouth at bedtime. 09/14/14  Yes Historical Provider, MD  traMADol (ULTRAM) 50 MG tablet Take 1-2 tablets (50-100 mg total) by mouth every 6 (six) hours as needed for moderate pain or severe pain. 12/10/14  Yes Michael Boston,  MD  valACYclovir (VALTREX) 500 MG tablet TAKE 1 TABLET (500 MG TOTAL) BY MOUTH TWO (2) TIMES A DAY. FOR 7 DAYS FOR COLD SORES 10/22/14  Yes Historical Provider, MD  oxyCODONE (OXY IR/ROXICODONE) 5 MG immediate release tablet Take 1-2 tablets (5-10 mg total) by mouth every 6 (six) hours as needed for moderate pain, severe pain or breakthrough pain. Patient not taking: Reported on 05/25/2015 12/11/14   Michael Boston, MD  rivaroxaban (XARELTO) 20 MG TABS tablet Take 1 tablet (20 mg total) by mouth daily with supper. Patient not taking: Reported on 05/25/2015 12/12/14   Michael Boston, MD  Spacer/Aero-Holding Chambers (AEROCHAMBER MV) inhaler Use as instructed 10/08/14   Rigoberto Noel, MD    Physical Exam: Filed Vitals:   05/25/15 2200 05/25/15 2215 05/25/15 2230 05/25/15 2245  BP: 129/80 119/74 115/88 122/83  Pulse: 78 73 73 73  Temp:      TempSrc:      Resp:      SpO2: 88% 95% 95% 96%   General: Not in acute distress HEENT:       Eyes: PERRL, EOMI, no scleral icterus.       ENT: No discharge from the ears and nose, no pharynx injection, no  tonsillar enlargement.        Neck: No JVD, no bruit, no mass felt. Heme: No neck lymph node enlargement. Cardiac: S1/S2, RRR, No murmurs, No gallops or rubs. Pulm:  No rales, wheezing, rhonchi or rubs. Abd: Soft, nondistended, nontender, no rebound pain, no organomegaly, BS present. GU: No hematuria Ext: No pitting leg edema bilaterally. 2+DP/PT pulse bilaterally. Musculoskeletal: No joint deformities, No joint redness or warmth, no limitation of ROM in spin.  Skin: No rashes.  Neuro: Alert, oriented X3, cranial nerves II-XII grossly intact, moves all extremities normally. Psych: Patient is not psychotic, no suicidal or hemocidal ideation.  Labs on Admission: I have personally reviewed following labs and imaging studies  CBC:  Recent Labs Lab 05/25/15 1944  WBC 6.7  NEUTROABS 4.4  HGB 14.3  HCT 42.3  MCV 91.4  PLT 0000000*   Basic Metabolic Panel:  Recent Labs Lab 05/25/15 1944  NA 139  K 4.9  CL 101  CO2 26  GLUCOSE 100*  BUN 14  CREATININE 1.28*  CALCIUM 9.7   GFR: CrCl cannot be calculated (Unknown ideal weight.). Liver Function Tests:  Recent Labs Lab 05/25/15 1944  AST 28  ALT 17  ALKPHOS 82  BILITOT 1.8*  PROT 7.1  ALBUMIN 4.1   No results for input(s): LIPASE, AMYLASE in the last 168 hours. No results for input(s): AMMONIA in the last 168 hours. Coagulation Profile: No results for input(s): INR, PROTIME in the last 168 hours. Cardiac Enzymes: No results for input(s): CKTOTAL, CKMB, CKMBINDEX, TROPONINI in the last 168 hours. BNP (last 3 results) No results for input(s): PROBNP in the last 8760 hours. HbA1C: No results for input(s): HGBA1C in the last 72 hours. CBG: No results for input(s): GLUCAP in the last 168 hours. Lipid Profile: No results for input(s): CHOL, HDL, LDLCALC, TRIG, CHOLHDL, LDLDIRECT in the last 72 hours. Thyroid Function Tests: No results for input(s): TSH, T4TOTAL, FREET4, T3FREE, THYROIDAB in the last 72 hours. Anemia  Panel: No results for input(s): VITAMINB12, FOLATE, FERRITIN, TIBC, IRON, RETICCTPCT in the last 72 hours. Urine analysis:    Component Value Date/Time   COLORURINE AMBER* 05/25/2015 1856   APPEARANCEUR HAZY* 05/25/2015 1856   LABSPEC 1.024 05/25/2015 1856   PHURINE 5.5 05/25/2015  [place] 05/25/2015 1856   HGBUR NEGATIVE 05/25/2015 1856   BILIRUBINUR SMALL* 05/25/2015 1856   KETONESUR 15* 05/25/2015 1856   PROTEINUR NEGATIVE 05/25/2015 1856   UROBILINOGEN 1.0 04/03/2014 1645   NITRITE NEGATIVE 05/25/2015 1856   LEUKOCYTESUR MODERATE* 05/25/2015 1856   Sepsis Labs: @LABRCNTIP (procalcitonin:4,lacticidven:4) )No results found for this or any previous visit (from the past 240 hour(s)).   Radiological Exams on Admission: Ct Angio Chest Pe W/cm &/or Wo Cm  05/26/2015  CLINICAL DATA:  Dyspnea. EXAM: CT ANGIOGRAPHY CHEST WITH CONTRAST TECHNIQUE: Multidetector CT imaging of the chest was performed using the standard protocol during bolus administration of intravenous contrast. Multiplanar CT image reconstructions and MIPs were obtained to evaluate the vascular anatomy. CONTRAST:  100 mL Isovue 300 intravenous COMPARISON:  01/28/2014 FINDINGS: Cardiovascular: There is good opacification of pulmonary vasculature. There are pulmonary emboli within the lower lobe arteries of both lungs. There is enlargement of the central left pulmonary artery, suggesting pulmonary hypertension. There are findings suggesting chronic pulmonary emboli, with vessel wall thickening and some eccentric thrombus. However, there also is acute pulmonary embolus with a few saddle emboli across bifurcations distally. The thoracic aorta is normal in caliber and intact. Lungs: Peripheral airspace opacity in the right lower lobe could represent pulmonary infarction. Infectious infiltrate or atelectasis also possible. Chronic scarring in the right apex. Mild scattered mosaic attenuation suggests air trapping. Central  airways: Patent Effusions: Trace right pleural effusion. Lymphadenopathy: None Esophagus: Unremarkable Upper abdomen: Cirrhotic liver, incompletely imaged. Musculoskeletal: No significant abnormality Review of the MIP images confirms the above findings. IMPRESSION: The study is positive for pulmonary embolism, probably acute on chronic. There are findings suggesting pulmonary arterial hypertension. No evidence of right heart strain. Possible pulmonary infarction in the right lower lobe periphery, although this could also represent atelectasis or infection. Scarring in the right apex. Cirrhosis. These results were called by telephone at the time of interpretation on 05/26/2015 at 12:10 am to Dr. Duffy Bruce , who verbally acknowledged these results. Electronically Signed   By: Andreas Newport M.D.   On: 05/26/2015 00:15   Ct Renal Stone Study  05/25/2015  CLINICAL DATA:  Right flank pain since noon today. EXAM: CT ABDOMEN AND PELVIS WITHOUT CONTRAST TECHNIQUE: Multidetector CT imaging of the abdomen and pelvis was performed following the standard protocol without IV contrast. COMPARISON:  10/04/2013 FINDINGS: Small bilateral pleural effusions with atelectasis in the lung bases. Small pericardial effusion. Multiple bilateral intrarenal stones are demonstrated. Largest is in the left upper pole and measures about 5 mm. No hydronephrosis or hydroureter. No ureteral stones or bladder stones. No bladder wall thickening. Low-attenuation lesion in the midpole of the right kidney likely representing a cyst. No change since previous study. Changes of hepatic cirrhosis with nodular hepatic contour and enlargement of the lateral segment left lobe. Mild prominence of lymph nodes in the celiac axis and porta hepatis are likely related to cirrhosis. Spleen is mildly enlarged. Mild splenic varices. The unenhanced appearance of the gallbladder, pancreas, adrenal glands, abdominal aorta, inferior vena cava, and retroperitoneal  lymph nodes is unremarkable. Small diverticulum of the second portion of the duodenum. Stomach, small bowel, and colon are not abnormally distended. Scattered stool in the colon. No free air or free fluid in the abdomen. Pelvis: The appendix is normal. Uterus is surgically absent. No free or loculated pelvic fluid collections. No pelvic mass or lymphadenopathy. Degenerative changes in the spine. No destructive bone lesions. IMPRESSION: Multiple bilateral nonobstructing intrarenal stones.  No hydronephrosis or hydroureter. No ureteral stones. Changes of hepatic cirrhosis with splenic enlargement. Small bilateral pleural effusions with infiltration or atelectasis in the lung bases. Small pericardial effusion. Electronically Signed   By: Lucienne Capers M.D.   On: 05/25/2015 21:17     EKG:  Not done in ED, will get one.   Assessment/Plan Principal Problem:   PE (pulmonary embolism) Active Problems:   Hepatitis B virus infection   Anxiety state   Essential hypertension   Coronary atherosclerosis   Hepatic cirrhosis (HCC)   Cocaine abuse   CKD (chronic kidney disease), stage III   COPD (chronic obstructive pulmonary disease) (HCC)   Tobacco abuse   DVT (deep venous thrombosis) (HCC)   UTI (lower urinary tract infection)   PE (pulmonary embolism): this is a recurrent issue, likely due to noncompliance to Xarelto. CTA showed acute on chronic PE. There are findings suggesting pulmonary arterial hypertension, but no evidence of right heart strain.  -will place to tele bed for obs - restart Xarelto per pharm -2D echocardiogram ordered -LE dopplers ordered to evaluate for DVT -pain control: When necessary oxycodone  UTI: -IV rocephin -f/u Ux  HTN: -hold spironolactone in the setting of recurrent PE -IV hydralazine when necessary  GERD: -Protonix  Depression and anxiety: Stable, no suicidal or homicidal ideations. -Continue home medications: Xanax, Celexa, and fanapt.  HBV: Normal  AST and ALT. Total bilirubin 1.8. -Continue Entecacvir  CKD (chronic kidney disease), stage III: stable. Baseline creatinine 1.18-1.28. Her creatinine is 1.28 on admission. -Follow-up renal function by BMP  Tobacco abuse: -Did counseling about importance of quitting smoking -Nicotine patch  COPD: stable. -Duonebs, prn albuterol nebulizers   DVT ppx: on Xarelto Code Status: Full code Family Communication: None at bed side.  Disposition Plan:  Anticipate discharge back to previous home environment Consults called:  none Admission status:  Obs / tele  Date of Service 05/26/2015    Ivor Costa [facility] Pager [phone]  If 7PM-7AM, please contact night-coverage [email or web address] Password [email or web address] 05/26/2015, 1:16 AM

## 2015-05-26 NOTE — Progress Notes (Signed)
ANTICOAGULATION CONSULT NOTE - Initial Consult  Pharmacy Consult for Xarelto Indication: pulmonary embolus  Allergies  Allergen Reactions  . Heparin Other (See Comments)    HIT ab positive with elevated OD  . Cymbalta [Duloxetine Hcl] Other (See Comments)    headaches  . Elavil [Amitriptyline] Other (See Comments)    Causes her to be very "disoriented".  . Gabapentin Other (See Comments)    REACTION: rash in mouth  . Neomycin-Bacitracin Zn-Polymyx Rash    Patient Measurements:    Vital Signs: Temp: 98.4 F (36.9 C) (05/02 1354) Temp Source: Oral (05/02 1354) BP: 122/83 mmHg (05/02 2245) Pulse Rate: 73 (05/02 2245)  Labs:  Recent Labs  05/25/15 1944  HGB 14.3  HCT 42.3  PLT 109*  CREATININE 1.28*    CrCl cannot be calculated (Unknown ideal weight.).   Medical History: Past Medical History  Diagnosis Date  . COPD (chronic obstructive pulmonary disease) ([place])   . Cirrhosis ([place])   . Pulmonary emboli ([place]) 09/2013  . Kidney stones   . Heart murmur   . DVT (deep venous thrombosis) ([place]) 09/2013    RLE  . Pneumonia     "4 times in the past year" (10/30/2013)  . History of blood transfusion     "due to excessive blood loss before hysterectomy"  . GERD (gastroesophageal reflux disease)   . Hepatitis B   . Arthritis     "knees; lower back" (10/30/2013)  . Anxiety   . Depression   . Bipolar disorder ([place])   . Hypertension     currently on no medication   . Shortness of breath dyspnea     increased exertion;exercise  . Agoraphobia with panic attacks   . History of urinary tract infection   . Urinary incontinence   . Urinary frequency   . Altered mental state 10/26/2013  . Ankle fracture, left 11/27/2010    S/p ORIF 11/2   . Cavitary pneumonia     Medications:  Albuterol  Ultram  Xanax  Celexa  Baraclude  Lodine  Fanapt  Protonix  Aldactone  Restoril  Valtrex  Assessment: 60 y.o. female with new PE for Xarelto.  Noted prior h/o PE 09/2013 treated with  Xarelto for ~ 1 year    Plan:  Xarelto 15 mg BID x 3 weeks, then 20 mg daily  Caryl Pina 05/26/2015,12:26 AM

## 2015-05-26 NOTE — Progress Notes (Signed)
Pt seen and examined, admitted earlier this AM for flank pain, diagnosed with acute on chronic PE as well as UTI. Currently no acute complaints.  Cont Rocephin. Cont Xarelto. Doppler positive for nonocclusive LLE DVT. Await ECHO.  Long discussion with patient about morbidity and mortality associated with PE/DVT. She expresses renewed understanding and determination to stay on Xarelto, which she previously discontinued due to bruising.

## 2015-05-26 NOTE — Progress Notes (Signed)
Patient still having 6/10 pain after receiving oxycodone.  MD made aware.  See new orders. Will continue to monitor.

## 2015-05-26 NOTE — ED Notes (Signed)
MD at bedside. 

## 2015-05-26 NOTE — Evaluation (Signed)
Physical Therapy Evaluation Patient Details Name: [name] MRN: [id number] DOB: 24-Feb-1955 Today's Date: 05/26/2015   History of Present Illness  This 60 y.o. female admitted with Rt flank pain and SOB.  CT angio showed PE.  PMH includes h/o DVT, PE, non compliance with Xarelto, COPD, GERD, depression, anxiety, HIT, HBV, cocaine abuse, tobacco abuse, CKD - stage III    Clinical Impression  Pt admitted with above diagnosis. Pt currently with functional limitations due to the deficits listed below (see PT Problem List). Ms. Lade very lethargic this afternoon and limited by Rt flank pain.  She currently requires close min guard assist sitting EOB and standing at bedside.  Anticipate that once pt more alert and w/ pain well controlled, she will progress w/ mobility quickly. Pt will benefit from skilled PT to increase their independence and safety with mobility to allow discharge to the venue listed below.      Follow Up Recommendations No PT follow up;Supervision - Intermittent    Equipment Recommendations  None recommended by PT    Recommendations for Other Services       Precautions / Restrictions Precautions Precautions: Other (comment) (monitor 02 sats ) Restrictions Weight Bearing Restrictions: No      Mobility  Bed Mobility Overal bed mobility: Needs Assistance Bed Mobility: Supine to Sit;Sit to Supine     Supine to sit: Supervision Sit to supine: Supervision   General bed mobility comments: Increased time due to lethargy and pain.  Use of bed rail.  Transfers Overall transfer level: Needs assistance Equipment used: None Transfers: Sit to/from Stand Sit to Stand: Min guard Stand pivot transfers: Min guard       General transfer comment: Slow to stand and pt swaying slightly, close min guard for safety.    Ambulation/Gait             General Gait Details: Not safe to attempt at this time due to lethargy  Stairs            Wheelchair Mobility     Modified Rankin (Stroke Patients Only)       Balance Overall balance assessment: Needs assistance Sitting-balance support: Feet supported;No upper extremity supported Sitting balance-Leahy Scale: Fair Sitting balance - Comments: Min guard for safety due to lethargy   Standing balance support: No upper extremity supported;During functional activity Standing balance-Leahy Scale: Fair Standing balance comment: Close min guard for safety                             Pertinent Vitals/Pain Pain Assessment: 0-10 Pain Score: 6  Pain Location: Rt back/flank and Lt LE Pain Descriptors / Indicators: Aching;Grimacing;Constant Pain Intervention(s): Limited activity within patient's tolerance;Monitored during session;Patient requesting pain meds-RN notified    Home Living Family/patient expects to be discharged to:: Private residence Living Arrangements: Children;Other relatives Available Help at Discharge: Family;Available PRN/intermittently Type of Home: House Home Access: Stairs to enter Entrance Stairs-Rails: None Entrance Stairs-Number of Steps: 2 Home Layout: One level Home Equipment: None      Prior Function Level of Independence: Independent         Comments: Pt denies h/o falls.  She does not drive      Hand Dominance   Dominant Hand: Right    Extremity/Trunk Assessment   Upper Extremity Assessment: Defer to OT evaluation           Lower Extremity Assessment: Generalized weakness      Cervical /  Trunk Assessment: Normal  Communication   Communication: No difficulties  Cognition Arousal/Alertness: Lethargic Behavior During Therapy: WFL for tasks assessed/performed Overall Cognitive Status: Within Functional Limits for tasks assessed                      General Comments General comments (skin integrity, edema, etc.): 02 sats on RA at rest 96%, with activity 92% - RN notified.  DOE 3/4     Exercises        Assessment/Plan     PT Assessment Patient needs continued PT services  PT Diagnosis Generalized weakness;Acute pain   PT Problem List Decreased strength;Decreased activity tolerance;Decreased balance;Decreased mobility;Decreased knowledge of use of DME;Decreased safety awareness;Cardiopulmonary status limiting activity;Pain  PT Treatment Interventions DME instruction;Gait training;Stair training;Functional mobility training;Therapeutic activities;Therapeutic exercise;Balance training;Patient/family education   PT Goals (Current goals can be found in the Care Plan section) Acute Rehab PT Goals Patient Stated Goal: to get better and do more tomorrow PT Goal Formulation: With patient Time For Goal Achievement: 06/09/15 Potential to Achieve Goals: Good    Frequency Min 3X/week   Barriers to discharge Decreased caregiver support;Inaccessible home environment steps to enter home and intermittent assist available    Co-evaluation               End of Session Equipment Utilized During Treatment: Oxygen Activity Tolerance: Patient limited by lethargy Patient left: in bed;with call bell/phone within reach;with bed alarm set Nurse Communication: Mobility status;Patient requests pain meds    Functional Assessment Tool Used: Clinical Judgement Functional Limitation: Mobility: Walking and moving around Mobility: Walking and Moving Around Current Status [phone]): At least 1 percent but less than 20 percent impaired, limited or restricted Mobility: Walking and Moving Around Goal Status [phone]): 0 percent impaired, limited or restricted    Time: [id number] PT Time Calculation (min) (ACUTE ONLY): 20 min   Charges:   PT Evaluation $PT Eval Low Complexity: 1 Procedure     PT G Codes:   PT G-Codes **NOT FOR INPATIENT CLASS** Functional Assessment Tool Used: Clinical Judgement Functional Limitation: Mobility: Walking and moving around Mobility: Walking and Moving Around Current Status [id number]): At least 1  percent but less than 20 percent impaired, limited or restricted Mobility: Walking and Moving Around Goal Status [id number]): 0 percent impaired, limited or restricted    Collie Siad PT, DPT  Pager: [phone] Phone: [phone] 05/26/2015, 3:36 PM

## 2015-05-26 NOTE — Evaluation (Signed)
Occupational Therapy Evaluation Patient Details Name: [name] MRN: [id number] DOB: 06-18-1955 Today's Date: 05/26/2015    History of Present Illness This 60 y.o. female admitted with Rt flank pain and SOB.  CT angio showed PE.  PMH includes h/o DVT, PE, non compliance with Xarelto, COPD, GERD, depression, anxiety, HIT, HBV, cocaine abuse, tobacco abuse, CKD - stage III   Clinical Impression   Pt admitted with above. She demonstrates the below listed deficits and will benefit from continued OT to maximize safety and independence with BADLs.  Pt presents to OT with generalized weakness, and acute pain.  She requires max A for LB ADLs as she is limited by pain and fatigue.  She was independent PTA.  Anticipate she will progress to mod I once pain improves.  Will follow acutely.       Follow Up Recommendations  No OT follow up;Supervision/Assistance - 24 hour    Equipment Recommendations  Tub/shower seat    Recommendations for Other Services       Precautions / Restrictions Precautions Precautions: Other (comment) (monitor 02 sats )      Mobility Bed Mobility Overal bed mobility: Needs Assistance Bed Mobility: Supine to Sit;Sit to Supine     Supine to sit: Supervision Sit to supine: Supervision      Transfers Overall transfer level: Needs assistance Equipment used: Rolling walker (2 wheeled) Transfers: Sit to/from [employer] Sit to Stand: Min guard Stand pivot transfers: Min guard            Balance Overall balance assessment: Needs assistance Sitting-balance support: Feet supported Sitting balance-Leahy Scale: Fair     Standing balance support: Bilateral upper extremity supported Standing balance-Leahy Scale: Fair                              ADL Overall ADL's : Needs assistance/impaired Eating/Feeding: Independent;Bed level   Grooming: Wash/dry hands;Wash/dry face;Oral care;Brushing hair;Set up;Bed level   Upper Body  Bathing: Minimal assitance;Sitting;Bed level   Lower Body Bathing: Maximal assistance;Sit to/from stand   Upper Body Dressing : Minimal assistance;Sitting   Lower Body Dressing: Total assistance;Sit to/from stand   Toilet Transfer: Min guard;Stand-pivot;BSC   Toileting- Water quality scientist and Hygiene: Minimal assistance;Sit to/from stand       Functional mobility during ADLs: Min guard;Rolling walker General ADL Comments: Pt limited by pain and DOE     Vision     Perception     Praxis      Pertinent Vitals/Pain Pain Assessment: 0-10 Pain Score: 7  Pain Location: Rt back/flank  Pain Descriptors / Indicators: Aching;Constant;Guarding Pain Intervention(s): Monitored during session;Limited activity within patient's tolerance;Premedicated before session     Hand Dominance Right   Extremity/Trunk Assessment Upper Extremity Assessment Upper Extremity Assessment: Generalized weakness   Lower Extremity Assessment Lower Extremity Assessment: Defer to PT evaluation   Cervical / Trunk Assessment Cervical / Trunk Assessment: Normal   Communication Communication Communication: No difficulties   Cognition Arousal/Alertness: Awake/alert Behavior During Therapy: WFL for tasks assessed/performed Overall Cognitive Status: Within Functional Limits for tasks assessed                     General Comments       Exercises       Shoulder Instructions      Home Living Family/patient expects to be discharged to:: Private residence Living Arrangements: Children;Other relatives Available Help at Discharge: Family;Available PRN/intermittently Type of Home:  House Home Access: Stairs to enter [employer] of Steps: 2   Home Layout: One level     Bathroom Shower/Tub: Occupational psychologist: Standard     Home Equipment: None          Prior Functioning/Environment Level of Independence: Independent        Comments: Pt denies h/o  falls.  She does not drive     OT Diagnosis: Generalized weakness;Acute pain   OT Problem List: Decreased strength;Decreased activity tolerance;Decreased knowledge of use of DME or AE;Cardiopulmonary status limiting activity;Obesity;Pain   OT Treatment/Interventions: Self-care/ADL training;Therapeutic exercise;DME and/or AE instruction;Energy conservation;Therapeutic activities;Patient/family education    OT Goals(Current goals can be found in the care plan section) Acute Rehab OT Goals Patient Stated Goal: to get better  OT Goal Formulation: With patient Time For Goal Achievement: 06/09/15 Potential to Achieve Goals: Good ADL Goals Pt Will Perform Grooming: with modified independence;standing Pt Will Perform Upper Body Bathing: with modified independence;sitting;standing Pt Will Perform Lower Body Bathing: with modified independence;sit to/from stand Pt Will Perform Upper Body Dressing: with modified independence;sitting;standing Pt Will Perform Lower Body Dressing: with modified independence;sit to/from stand Pt Will Transfer to Toilet: with modified independence;ambulating;regular height toilet;bedside commode;grab bars Pt Will Perform Toileting - Clothing Manipulation and hygiene: with modified independence;sit to/from stand Pt Will Perform Tub/Shower Transfer: Shower transfer;with modified independence;shower seat;ambulating;rolling walker  OT Frequency: Min 2X/week   Barriers to D/C:            Co-evaluation              End of Session Equipment Utilized During Treatment: Surveyor, mining Communication: Mobility status  Activity Tolerance: Patient limited by pain;Patient limited by fatigue Patient left: in bed;with call bell/phone within reach   Time: 1354-1411 OT Time Calculation (min): 17 min Charges:  OT General Charges $OT Visit: 1 Procedure OT Evaluation $OT Eval Moderate Complexity: 1 Procedure G-Codes:    Rayfield Beem M 06-22-2015, 3:18 PM

## 2015-05-26 NOTE — Progress Notes (Signed)
  Echocardiogram 2D Echocardiogram has been performed.  Bobbye Charleston 05/26/2015, 12:18 PM

## 2015-05-26 NOTE — ED Provider Notes (Addendum)
Pt seen and evaluated.  Pt with right anterior chest wall pain.  Her symptoms shortly before arrival here. Not tachycardic. He medically stable. Does have desaturation to 8788% on room air. CT stone is negative. CT angiogram shows bilateral lower lobe pulmonary emboli. Is not unstable. No criteria for massive PE.  Agree with Xarelto, and admission.  Tanna Furry, MD 05/26/15 0021  Tanna Furry, MD 05/31/15 [phone]

## 2015-05-26 NOTE — Progress Notes (Signed)
VASCULAR LAB PRELIMINARY  PRELIMINARY  PRELIMINARY  PRELIMINARY  Bilateral lower extremity venous duplex  completed.    Preliminary report:  Right:  No evidence of DVT, superficial thrombosis, or Baker's cyst.  Left: Non occlusive DVT noted in the popliteal vein and peroneal vein.  No evidence of superficial thrombosis.  No Baker's cyst.   Marra Fraga, RVT 05/26/2015, 10:39 AM

## 2015-05-27 DIAGNOSIS — N183 Chronic kidney disease, stage 3 (moderate): Secondary | ICD-10-CM | POA: Diagnosis not present

## 2015-05-27 DIAGNOSIS — I2699 Other pulmonary embolism without acute cor pulmonale: Secondary | ICD-10-CM | POA: Diagnosis not present

## 2015-05-27 LAB — URINE CULTURE: Special Requests: NORMAL

## 2015-05-27 LAB — GLUCOSE, CAPILLARY: GLUCOSE-CAPILLARY: 124 mg/dL — AB (ref 65–99)

## 2015-05-27 MED ORDER — CIPROFLOXACIN HCL 250 MG PO TABS: 250.0000 mg | ORAL_TABLET | Freq: Two times a day (BID) | ORAL | Status: DC

## 2015-05-27 MED ORDER — OXYCODONE HCL 5 MG PO TABS: 5.0000 mg | ORAL_TABLET | ORAL | Status: DC | PRN

## 2015-05-27 MED ORDER — TRAMADOL HCL 50 MG PO TABS: 50.0000 mg | ORAL_TABLET | Freq: Four times a day (QID) | ORAL | Status: DC | PRN

## 2015-05-27 MED ORDER — RIVAROXABAN (XARELTO) VTE STARTER PACK (15 & 20 MG): ORAL_TABLET | ORAL | Status: DC

## 2015-05-27 MED ORDER — BUDESONIDE-FORMOTEROL FUMARATE 80-4.5 MCG/ACT IN AERO: 2.0000 | INHALATION_SPRAY | Freq: Two times a day (BID) | RESPIRATORY_TRACT | Status: DC

## 2015-05-27 MED ORDER — SODIUM CHLORIDE 0.9% FLUSH: 10.0000 mL | INTRAVENOUS | Status: DC | PRN

## 2015-05-27 NOTE — Progress Notes (Signed)
Occupational Therapy Treatment Patient Details Name: [name] MRN: [id number] DOB: Nov 23, 1955 Today's Date: 05/27/2015    History of present illness This 60 y.o. female admitted with Rt flank pain and SOB.  CT angio showed PE.  PMH includes h/o DVT, PE, non compliance with Xarelto, COPD, GERD, depression, anxiety, HIT, HBV, cocaine abuse, tobacco abuse, CKD - stage III   OT comments  Pt with limited participation and declined to stay OOB  Follow Up Recommendations  No OT follow up;Supervision/Assistance - 24 hour    Equipment Recommendations  Tub/shower seat    Recommendations for Other Services      Precautions / Restrictions Precautions Precautions: Fall Restrictions Weight Bearing Restrictions: No       Mobility Bed Mobility Overal bed mobility: Needs Assistance Bed Mobility: Supine to Sit;Sit to Supine     Supine to sit: Min assist Sit to supine: Min assist   General bed mobility comments: Increased time due to lethargy and pain.  Use of bed rail.  Transfers Overall transfer level: Needs assistance Equipment used: Rolling walker (2 wheeled)   Sit to Stand: Min assist                  ADL Overall ADL's : Needs assistance/impaired     Grooming: Sitting;Wash/dry face               Lower Body Dressing: Moderate assistance;Sit to/from stand;Cueing for safety;Cueing for sequencing                                  Cognition   Behavior During Therapy: [facility] for tasks assessed/performed Overall Cognitive Status: No family/caregiver present to determine baseline cognitive functioning                               General Comments      Pertinent Vitals/ Pain       Pain Score: 6  Pain Location: back Pain Descriptors / Indicators: Aching Pain Intervention(s): Monitored during session;Repositioned;Premedicated before session  Home Living                                          Prior  Functioning/Environment              Frequency Min 2X/week     Progress Toward Goals  OT Goals(current goals can now be found in the care plan section)  Progress towards OT goals: OT to reassess next treatment     Plan Discharge plan remains appropriate    Co-evaluation                 End of Session Equipment Utilized During Treatment: Rolling walker   Activity Tolerance Patient limited by fatigue   Patient Left in bed;with call bell/phone within reach;with nursing/sitter in room   Nurse Communication Mobility status    Functional Assessment Tool Used: clinical observation Functional Limitation: Self care Self Care Current Status [id number]): At least 20 percent but less than 40 percent impaired, limited or restricted Self Care Goal Status [id number]): At least 1 percent but less than 20 percent impaired, limited or restricted   Time: 1003-1017 OT Time Calculation (min): 14 min  Charges: OT G-codes **NOT FOR INPATIENT CLASS** Functional Assessment Tool Used: clinical observation Functional Limitation:  Self care Self Care Current Status [id number]): At least 20 percent but less than 40 percent impaired, limited or restricted Self Care Goal Status [id number]): At least 1 percent but less than 20 percent impaired, limited or restricted OT General Charges $OT Visit: 1 Procedure OT Treatments $Self Care/Home Management : 8-22 mins  [place], [name] 05/27/2015, 10:55 AM

## 2015-05-27 NOTE — Discharge Instructions (Signed)
Rivaroxaban oral tablets What is this medicine? RIVAROXABAN (ri va ROX a ban) is an anticoagulant (blood thinner). It is used to treat blood clots in the lungs or in the veins. It is also used after knee or hip surgeries to prevent blood clots. It is also used to lower the chance of stroke in people with a medical condition called atrial fibrillation. This medicine may be used for other purposes; ask your health care provider or pharmacist if you have questions. What should I tell my health care provider before I take this medicine? They need to know if you have any of these conditions: -bleeding disorders -bleeding in the brain -blood in your stools (black or tarry stools) or if you have blood in your vomit -history of stomach bleeding -kidney disease -liver disease -low blood counts, like low white cell, platelet, or red cell counts -recent or planned spinal or epidural procedure -take medicines that treat or prevent blood clots -an unusual or allergic reaction to rivaroxaban, other medicines, foods, dyes, or preservatives -pregnant or trying to get pregnant -breast-feeding How should I use this medicine? Take this medicine by mouth with a glass of water. Follow the directions on the prescription label. Take your medicine at regular intervals. Do not take it more often than directed. Do not stop taking except on your doctor's advice. Stopping this medicine may increase your risk of a blood clot. Be sure to refill your prescription before you run out of medicine. If you are taking this medicine after hip or knee replacement surgery, take it with or without food. If you are taking this medicine for atrial fibrillation, take it with your evening meal. If you are taking this medicine to treat blood clots, take it with food at the same time each day. If you are unable to swallow your tablet, you may crush the tablet and mix it in applesauce. Then, immediately eat the applesauce. You should eat more  food right after you eat the applesauce containing the crushed tablet. Talk to your pediatrician regarding the use of this medicine in children. Special care may be needed. Overdosage: If you think you have taken too much of this medicine contact a poison control center or emergency room at once. NOTE: This medicine is only for you. Do not share this medicine with others. What if I miss a dose? If you take your medicine once a day and miss a dose, take the missed dose as soon as you remember. If you take your medicine twice a day and miss a dose, take the missed dose immediately. In this instance, 2 tablets may be taken at the same time. The next day you should take 1 tablet twice a day as directed. What may interact with this medicine? -aspirin and aspirin-like medicines -certain antibiotics like erythromycin, azithromycin, and clarithromycin -certain medicines for fungal infections like ketoconazole and itraconazole -certain medicines for irregular heart beat like amiodarone, quinidine, dronedarone -certain medicines for seizures like carbamazepine, phenytoin -certain medicines that treat or prevent blood clots like warfarin, enoxaparin, and dalteparin -conivaptan -diltiazem -felodipine -indinavir -lopinavir; ritonavir -NSAIDS, medicines for pain and inflammation, like ibuprofen or naproxen -ranolazine -rifampin -ritonavir -St. John's wort -verapamil This list may not describe all possible interactions. Give your health care provider a list of all the medicines, herbs, non-prescription drugs, or dietary supplements you use. Also tell them if you smoke, drink alcohol, or use illegal drugs. Some items may interact with your medicine. What should I watch for while using this   medicine? Visit your doctor or health care professional for regular checks on your progress. Your condition will be monitored carefully while you are receiving this medicine. Notify your doctor or health care  professional and seek emergency treatment if you develop breathing problems; changes in vision; chest pain; severe, sudden headache; pain, swelling, warmth in the leg; trouble speaking; sudden numbness or weakness of the face, arm, or leg. These can be signs that your condition has gotten worse. If you are going to have surgery, tell your doctor or health care professional that you are taking this medicine. Tell your health care professional that you use this medicine before you have a spinal or epidural procedure. Sometimes people who take this medicine have bleeding problems around the spine when they have a spinal or epidural procedure. This bleeding is very rare. If you have a spinal or epidural procedure while on this medicine, call your health care professional immediately if you have back pain, numbness or tingling (especially in your legs and feet), muscle weakness, paralysis, or loss of bladder or bowel control. Avoid sports and activities that might cause injury while you are using this medicine. Severe falls or injuries can cause unseen bleeding. Be careful when using sharp tools or knives. Consider using an electric razor. Take special care brushing or flossing your teeth. Report any injuries, bruising, or red spots on the skin to your doctor or health care professional. What side effects may I notice from receiving this medicine? Side effects that you should report to your doctor or health care professional as soon as possible: -allergic reactions like skin rash, itching or hives, swelling of the face, lips, or tongue -back pain -redness, blistering, peeling or loosening of the skin, including inside the mouth -signs and symptoms of bleeding such as bloody or black, tarry stools; red or dark-brown urine; spitting up blood or brown material that looks like coffee grounds; red spots on the skin; unusual bruising or bleeding from the eye, gums, or nose Side effects that usually do not require  medical attention (Report these to your doctor or health care professional if they continue or are bothersome.): -dizziness -muscle pain This list may not describe all possible side effects. Call your doctor for medical advice about side effects. You may report side effects to FDA at 1-800-FDA-1088. Where should I keep my medicine? Keep out of the reach of children. Store at room temperature between 15 and 30 degrees C (59 and 86 degrees F). Throw away any unused medicine after the expiration date. NOTE: This sheet is a summary. It may not cover all possible information. If you have questions about this medicine, talk to your doctor, pharmacist, or health care provider.    2016, [employer]. (2014-01-07 12:45:34)  

## 2015-05-27 NOTE — Discharge Summary (Signed)
Discharge Summary  [name] [id number] DOB: 06/11/1955  PCP: Marijean Bravo, MD  Admit date: 05/25/2015 Discharge date: 05/27/2015  Time spent: 32   Recommendations for Outpatient Follow-up:  1. PCP in 1 week  Discharge Diagnoses:  Active Hospital Problems   Diagnosis Date Noted  . PE (pulmonary embolism) 10/26/2013  . UTI (lower urinary tract infection) 05/26/2015  . DVT (deep venous thrombosis) ([place]) 10/31/2013  . Tobacco abuse 10/04/2013  . COPD (chronic obstructive pulmonary disease) ([place])   . CKD (chronic kidney disease), stage III 12/21/2012  . Cocaine abuse 11/27/2010  . Hepatitis B virus infection 08/31/2008  . Anxiety state 08/31/2008  . Essential hypertension 08/31/2008  . Coronary atherosclerosis 08/31/2008  . Hepatic cirrhosis ([place]) 08/31/2008    Resolved Hospital Problems   Diagnosis Date Noted Date Resolved  No resolved problems to display.    Discharge Condition: Stable   Diet recommendation: Regular   Filed Vitals:   05/27/15 0746 05/27/15 0916  BP: 121/70 126/74  Pulse: 66 67  Temp: 98.2 F (36.8 C) 98.6 F (37 C)  Resp: 17     History of present illness:  60 y.o. female with medical history significant of DVT, PE not compliance to Xarelto, COPD, GERD, depression, anxiety, HIT, HBV, cocaine abuse, tobacco abuse, chronic kidney disease-stage III, cavitary pneumonia, who presents with right flank pain and shortness of breath. Found to have PE and DVT, recurrent, due to discontinuation of Xarelto.   Hospital Course:  Principal Problem:   PE (pulmonary embolism) - admitted under obs status, started on Xarelto. Tolerated well with no complications. Had Echo of the heart with no evidence of heart strain. As tolerating well, she is ready for discharge from the hospital today.  Active Problems:   Hepatitis B virus infection   Anxiety state   Essential hypertension   Coronary atherosclerosis   Hepatic cirrhosis ([facility])   Cocaine abuse   CKD  (chronic kidney disease), stage III   COPD (chronic obstructive pulmonary disease) ([facility])   Tobacco abuse   DVT (deep venous thrombosis) ([facility])   UTI (lower urinary tract infection) - treated with Ceftriaxone empirically, will cont PO abx at d/c.   Procedures:  Echo 5/3   Consultations:  None   Discharge Exam: BP 126/74 mmHg  Pulse 67  Temp(Src) 98.6 F (37 C) (Oral)  Resp 17  Ht 5\' 4"  (1.626 m)  Wt 107.548 kg (237 lb 1.6 oz)  BMI 40.68 kg/m2  SpO2 99%  General: Not in acute distress HEENT:  Eyes: PERRL, EOMI, no scleral icterus.  ENT: No discharge from the ears and nose, no pharynx injection, no tonsillar enlargement.   Neck: No JVD, no bruit, no mass felt. Heme: No neck lymph node enlargement. Cardiac: S1/S2, RRR, No murmurs, No gallops or rubs. Pulm: No rales, wheezing, rhonchi or rubs. Abd: Soft, nondistended, nontender, no rebound pain, no organomegaly, BS present. GU: No hematuria Ext: No pitting leg edema bilaterally. 2+DP/PT pulse bilaterally. Musculoskeletal: No joint deformities, No joint redness or warmth, no limitation of ROM in spin.  Skin: No rashes.  Neuro: Alert, oriented X3, cranial nerves II-XII grossly intact, moves all extremities normally. Psych: Patient is not psychotic, no suicidal or hemocidal ideation.  Discharge Instructions You were cared for by a hospitalist during your hospital stay. If you have any questions about your discharge medications or the care you received while you were in the hospital after you are discharged, you can call the unit and asked to speak  with the hospitalist on call if the hospitalist that took care of you is not available. Once you are discharged, your primary care physician will handle any further medical issues. Please note that NO REFILLS for any discharge medications will be authorized once you are discharged, as it is imperative that you return to your primary care physician (or establish a  relationship with a primary care physician if you do not have one) for your aftercare needs so that they can reassess your need for medications and monitor your lab values.     Medication List    STOP taking these medications        rivaroxaban 20 MG Tabs tablet  Commonly known as:  XARELTO  Replaced by:  Rivaroxaban 15 & 20 MG Tbpk     spironolactone 50 MG tablet  Commonly known as:  ALDACTONE      TAKE these medications        AEROCHAMBER MV inhaler  Use as instructed     ALPRAZolam 1 MG tablet  Commonly known as:  XANAX  Take 1 mg by mouth 3 (three) times daily as needed for anxiety.     ciprofloxacin 250 MG tablet  Commonly known as:  CIPRO  Take 1 tablet (250 mg total) by mouth 2 (two) times daily.     citalopram 20 MG tablet  Commonly known as:  CELEXA  Take 20 mg by mouth at bedtime.     entecavir 0.5 MG tablet  Commonly known as:  BARACLUDE  Take 0.5 mg by mouth every other day.     etodolac 400 MG tablet  Commonly known as:  LODINE  Take 1 tablet by mouth 2 (two) times daily as needed (pain).     FANAPT 6 MG Tabs  Generic drug:  Iloperidone  Take 12 mg by mouth every morning.     oxyCODONE 5 MG immediate release tablet  Commonly known as:  Oxy IR/ROXICODONE  Take 1-2 tablets (5-10 mg total) by mouth every 6 (six) hours as needed for moderate pain, severe pain or breakthrough pain.     oxyCODONE 5 MG immediate release tablet  Commonly known as:  Oxy IR/ROXICODONE  Take 1 tablet (5 mg total) by mouth every 4 (four) hours as needed for moderate pain or breakthrough pain.     pantoprazole 40 MG tablet  Commonly known as:  PROTONIX  Take 40 mg by mouth daily.     PROAIR HFA 108 (90 Base) MCG/ACT inhaler  Generic drug:  albuterol  INHALE 2 PUFFS INTO THE LUNGS EVERY 6 HOURS AS NEEDED FOR WHEEZING OR SHORTNESS OF BREATH.     Rivaroxaban 15 & 20 MG Tbpk  Take as directed on package: Start with one 15mg  tablet by mouth twice a day with food. On Day 22,  switch to one 20mg  tablet once a day with food.     temazepam 30 MG capsule  Commonly known as:  RESTORIL  Take 30 mg by mouth at bedtime.     traMADol 50 MG tablet  Commonly known as:  ULTRAM  Take 1-2 tablets (50-100 mg total) by mouth every 6 (six) hours as needed for moderate pain or severe pain.     valACYclovir 500 MG tablet  Commonly known as:  VALTREX  TAKE 1 TABLET (500 MG TOTAL) BY MOUTH TWO (2) TIMES A DAY. FOR 7 DAYS FOR COLD SORES       Allergies  Allergen Reactions  . Heparin Other (See Comments)  HIT ab positive with elevated OD  . Cymbalta [Duloxetine Hcl] Other (See Comments)    headaches  . Elavil [Amitriptyline] Other (See Comments)    Causes her to be very "disoriented".  . Gabapentin Other (See Comments)    REACTION: rash in mouth  . Neomycin-Bacitracin Zn-Polymyx Rash       Follow-up Information    Follow up with TALBOT, DAVID C, MD In 1 week.   Specialty:  Internal Medicine   Contact information:   [address] [place] [address] [phone]        The results of significant diagnostics from this hospitalization (including imaging, microbiology, ancillary and laboratory) are listed below for reference.    Significant Diagnostic Studies: Ct Angio Chest Pe W/cm &/or Wo Cm  05/26/2015  CLINICAL DATA:  Dyspnea. EXAM: CT ANGIOGRAPHY CHEST WITH CONTRAST TECHNIQUE: Multidetector CT imaging of the chest was performed using the standard protocol during bolus administration of intravenous contrast. Multiplanar CT image reconstructions and MIPs were obtained to evaluate the vascular anatomy. CONTRAST:  100 mL Isovue 300 intravenous COMPARISON:  01/28/2014 FINDINGS: Cardiovascular: There is good opacification of pulmonary vasculature. There are pulmonary emboli within the lower lobe arteries of both lungs. There is enlargement of the central left pulmonary artery, suggesting pulmonary hypertension. There are findings suggesting chronic  pulmonary emboli, with vessel wall thickening and some eccentric thrombus. However, there also is acute pulmonary embolus with a few saddle emboli across bifurcations distally. The thoracic aorta is normal in caliber and intact. Lungs: Peripheral airspace opacity in the right lower lobe could represent pulmonary infarction. Infectious infiltrate or atelectasis also possible. Chronic scarring in the right apex. Mild scattered mosaic attenuation suggests air trapping. Central airways: Patent Effusions: Trace right pleural effusion. Lymphadenopathy: None Esophagus: Unremarkable Upper abdomen: Cirrhotic liver, incompletely imaged. Musculoskeletal: No significant abnormality Review of the MIP images confirms the above findings. IMPRESSION: The study is positive for pulmonary embolism, probably acute on chronic. There are findings suggesting pulmonary arterial hypertension. No evidence of right heart strain. Possible pulmonary infarction in the right lower lobe periphery, although this could also represent atelectasis or infection. Scarring in the right apex. Cirrhosis. These results were called by telephone at the time of interpretation on 05/26/2015 at 12:10 am to Dr. Duffy Bruce , who verbally acknowledged these results. Electronically Signed   By: Andreas Newport M.D.   On: 05/26/2015 00:15   Ct Renal Stone Study  05/25/2015  CLINICAL DATA:  Right flank pain since noon today. EXAM: CT ABDOMEN AND PELVIS WITHOUT CONTRAST TECHNIQUE: Multidetector CT imaging of the abdomen and pelvis was performed following the standard protocol without IV contrast. COMPARISON:  10/04/2013 FINDINGS: Small bilateral pleural effusions with atelectasis in the lung bases. Small pericardial effusion. Multiple bilateral intrarenal stones are demonstrated. Largest is in the left upper pole and measures about 5 mm. No hydronephrosis or hydroureter. No ureteral stones or bladder stones. No bladder wall thickening. Low-attenuation lesion in  the midpole of the right kidney likely representing a cyst. No change since previous study. Changes of hepatic cirrhosis with nodular hepatic contour and enlargement of the lateral segment left lobe. Mild prominence of lymph nodes in the celiac axis and porta hepatis are likely related to cirrhosis. Spleen is mildly enlarged. Mild splenic varices. The unenhanced appearance of the gallbladder, pancreas, adrenal glands, abdominal aorta, inferior vena cava, and retroperitoneal lymph nodes is unremarkable. Small diverticulum of the second portion of the duodenum. Stomach, small bowel, and colon are  not abnormally distended. Scattered stool in the colon. No free air or free fluid in the abdomen. Pelvis: The appendix is normal. Uterus is surgically absent. No free or loculated pelvic fluid collections. No pelvic mass or lymphadenopathy. Degenerative changes in the spine. No destructive bone lesions. IMPRESSION: Multiple bilateral nonobstructing intrarenal stones. No hydronephrosis or hydroureter. No ureteral stones. Changes of hepatic cirrhosis with splenic enlargement. Small bilateral pleural effusions with infiltration or atelectasis in the lung bases. Small pericardial effusion. Electronically Signed   By: Lucienne Capers M.D.   On: 05/25/2015 21:17    Microbiology: Recent Results (from the past 240 hour(s))  MRSA PCR Screening     Status: None   Collection Time: 05/26/15  2:00 AM  Result Value Ref Range Status   MRSA by PCR NEGATIVE NEGATIVE Final    Comment:        The GeneXpert MRSA Assay (FDA approved for NASAL specimens only), is one component of a comprehensive MRSA colonization surveillance program. It is not intended to diagnose MRSA infection nor to guide or monitor treatment for MRSA infections.      Labs: Basic Metabolic Panel:  Recent Labs Lab 05/25/15 1944 05/26/15 0219  NA 139 136  K 4.9 3.6  CL 101 103  CO2 26 24  GLUCOSE 100* 144*  BUN 14 13  CREATININE 1.28* 1.28*    CALCIUM 9.7 9.0   Liver Function Tests:  Recent Labs Lab 05/25/15 1944  AST 28  ALT 17  ALKPHOS 82  BILITOT 1.8*  PROT 7.1  ALBUMIN 4.1   No results for input(s): LIPASE, AMYLASE in the last 168 hours. No results for input(s): AMMONIA in the last 168 hours. CBC:  Recent Labs Lab 05/25/15 1944 05/26/15 0219  WBC 6.7 5.4  NEUTROABS 4.4  --   HGB 14.3 12.0  HCT 42.3 36.4  MCV 91.4 89.2  PLT 109* 103*   Cardiac Enzymes:  Recent Labs Lab 05/26/15 0219  TROPONINI <0.03   BNP: BNP (last 3 results)  Recent Labs  05/26/15 0219  BNP 8.5    ProBNP (last 3 results) No results for input(s): PROBNP in the last 8760 hours.  CBG:  Recent Labs Lab 05/26/15 1225 05/27/15 0608  GLUCAP 131* 124*       Signed:  [place]  [facility] 05/27/2015, 10:43 AM

## 2015-05-27 NOTE — Progress Notes (Signed)
Physical Therapy Treatment Patient Details Name: [name] MRN: [id number] DOB: Jun 28, 1955 Today's Date: 05/27/2015    History of Present Illness This 60 y.o. female admitted with Rt flank pain and SOB.  CT angio showed PE.  PMH includes h/o DVT, PE, non compliance with Xarelto, COPD, GERD, depression, anxiety, HIT, HBV, cocaine abuse, tobacco abuse, CKD - stage III    PT Comments    Norah made good progress today, although remains limited by lethargy.  SpO2 down to 87% while ambulating but quickly back up to low 90s w/ cues for pursed lip breathing.  Daughter present during session and stair training completed.     Follow Up Recommendations  No PT follow up;Supervision - Intermittent     Equipment Recommendations  None recommended by PT    Recommendations for Other Services       Precautions / Restrictions Precautions Precautions: Fall Precaution Comments: monitor 02 sats  Restrictions Weight Bearing Restrictions: No    Mobility  Bed Mobility Overal bed mobility: Needs Assistance Bed Mobility: Supine to Sit;Sit to Supine     Supine to sit: Supervision Sit to supine: Supervision   General bed mobility comments: Increased time due to lethargy and pain.  Use of bed rail.  Transfers Overall transfer level: Needs assistance Equipment used: None Transfers: Sit to/from Stand Sit to Stand: Min guard         General transfer comment: Close min gaurd due to pt's lethargy.  Ambulation/Gait Ambulation/Gait assistance: Min guard Ambulation Distance (Feet): 250 Feet Assistive device: None Gait Pattern/deviations: Step-through pattern;Decreased stride length   Gait velocity interpretation: Below normal speed for age/gender General Gait Details: Dec gait speed likely due to lethargy.  SpO2 down to 87% while ambulating but quickly back up to low 90s w/ cues for pursed lip breathing.   Stairs Stairs: Yes Stairs assistance: Min assist Stair Management: No rails;Step to  pattern;Forwards Number of Stairs: 2 General stair comments: 1 person HHA for safety.  Min instability but no overt LOB.  Wheelchair Mobility    Modified Rankin (Stroke Patients Only)       Balance Overall balance assessment: Needs assistance Sitting-balance support: No upper extremity supported;Feet supported Sitting balance-Leahy Scale: Fair Sitting balance - Comments: Min guard for safety due to lethargy   Standing balance support: No upper extremity supported;During functional activity Standing balance-Leahy Scale: Fair Standing balance comment: Min guard for safety due to lethargy                    Cognition Arousal/Alertness: Lethargic Behavior During Therapy: WFL for tasks assessed/performed Overall Cognitive Status: Within Functional Limits for tasks assessed                      Exercises      General Comments General comments (skin integrity, edema, etc.): Daughter present during session and reports someone is at home w/ the pt 95% of the time, rarely alone..      Pertinent Vitals/Pain Pain Assessment: 0-10 Pain Score: 4  Pain Location: Rt back/flank Pain Descriptors / Indicators: Aching;Discomfort Pain Intervention(s): Monitored during session;Limited activity within patient's tolerance    Home Living                      Prior Function            PT Goals (current goals can now be found in the care plan section) Acute Rehab PT Goals Patient Stated Goal: to  go home and see her grandchildren PT Goal Formulation: With patient Time For Goal Achievement: 06/09/15 Potential to Achieve Goals: Good Progress towards PT goals: Progressing toward goals    Frequency  Min 3X/week    PT Plan Current plan remains appropriate    Co-evaluation             End of Session Equipment Utilized During Treatment: Gait belt Activity Tolerance: Patient limited by lethargy Patient left: in bed;with call bell/phone within reach;with bed  alarm set;with family/visitor present     Time: 1136-1150 PT Time Calculation (min) (ACUTE ONLY): 14 min  Charges:  $Gait Training: 8-22 mins                    G Codes:      Collie Siad PT, DPT  Pager: [phone] Phone: [phone] 05/27/2015, 12:02 PM

## 2015-05-28 ENCOUNTER — Emergency Department (HOSPITAL_COMMUNITY)
Admission: EM | Admit: 2015-05-28 | Discharge: 2015-05-28 | Disposition: A | Discharge status: Home / Self Care | Payer: Medicare Other | Attending: Emergency Medicine | Admitting: Emergency Medicine

## 2015-05-28 ENCOUNTER — Encounter (HOSPITAL_COMMUNITY): Payer: Self-pay

## 2015-05-28 ENCOUNTER — Emergency Department (HOSPITAL_COMMUNITY): Payer: Medicare Other

## 2015-05-28 DIAGNOSIS — Z87442 Personal history of urinary calculi: Secondary | ICD-10-CM | POA: Diagnosis not present

## 2015-05-28 DIAGNOSIS — K219 Gastro-esophageal reflux disease without esophagitis: Secondary | ICD-10-CM | POA: Diagnosis not present

## 2015-05-28 DIAGNOSIS — R011 Cardiac murmur, unspecified: Secondary | ICD-10-CM | POA: Diagnosis not present

## 2015-05-28 DIAGNOSIS — J441 Chronic obstructive pulmonary disease with (acute) exacerbation: Secondary | ICD-10-CM | POA: Insufficient documentation

## 2015-05-28 DIAGNOSIS — F419 Anxiety disorder, unspecified: Secondary | ICD-10-CM | POA: Insufficient documentation

## 2015-05-28 DIAGNOSIS — Z8701 Personal history of pneumonia (recurrent): Secondary | ICD-10-CM | POA: Diagnosis not present

## 2015-05-28 DIAGNOSIS — I2699 Other pulmonary embolism without acute cor pulmonale: Secondary | ICD-10-CM | POA: Diagnosis not present

## 2015-05-28 DIAGNOSIS — I129 Hypertensive chronic kidney disease with stage 1 through stage 4 chronic kidney disease, or unspecified chronic kidney disease: Secondary | ICD-10-CM | POA: Insufficient documentation

## 2015-05-28 DIAGNOSIS — M1990 Unspecified osteoarthritis, unspecified site: Secondary | ICD-10-CM | POA: Diagnosis not present

## 2015-05-28 DIAGNOSIS — Z79899 Other long term (current) drug therapy: Secondary | ICD-10-CM | POA: Diagnosis not present

## 2015-05-28 DIAGNOSIS — Z8781 Personal history of (healed) traumatic fracture: Secondary | ICD-10-CM | POA: Diagnosis not present

## 2015-05-28 DIAGNOSIS — Z8619 Personal history of other infectious and parasitic diseases: Secondary | ICD-10-CM | POA: Insufficient documentation

## 2015-05-28 DIAGNOSIS — F319 Bipolar disorder, unspecified: Secondary | ICD-10-CM | POA: Insufficient documentation

## 2015-05-28 DIAGNOSIS — N183 Chronic kidney disease, stage 3 (moderate): Secondary | ICD-10-CM | POA: Insufficient documentation

## 2015-05-28 DIAGNOSIS — Z86718 Personal history of other venous thrombosis and embolism: Secondary | ICD-10-CM | POA: Diagnosis not present

## 2015-05-28 DIAGNOSIS — R109 Unspecified abdominal pain: Secondary | ICD-10-CM | POA: Diagnosis present

## 2015-05-28 LAB — CBC
HCT: 32.5 % — ABNORMAL LOW (ref 36.0–46.0)
Hemoglobin: 10.8 g/dL — ABNORMAL LOW (ref 12.0–15.0)
MCH: 30.5 pg (ref 26.0–34.0)
MCHC: 33.2 g/dL (ref 30.0–36.0)
MCV: 91.8 fL (ref 78.0–100.0)
Platelets: 92 K/uL — ABNORMAL LOW (ref 150–400)
RBC: 3.54 MIL/uL — ABNORMAL LOW (ref 3.87–5.11)
RDW: 12.9 % (ref 11.5–15.5)
WBC: 2.1 K/uL — ABNORMAL LOW (ref 4.0–10.5)

## 2015-05-28 LAB — BASIC METABOLIC PANEL WITH GFR
Anion gap: 11 (ref 5–15)
BUN: 9 mg/dL (ref 6–20)
CO2: 23 mmol/L (ref 22–32)
Calcium: 8.9 mg/dL (ref 8.9–10.3)
Chloride: 105 mmol/L (ref 101–111)
Creatinine, Ser: 1.15 mg/dL — ABNORMAL HIGH (ref 0.44–1.00)
GFR calc Af Amer: 60 mL/min — ABNORMAL LOW
GFR calc non Af Amer: 51 mL/min — ABNORMAL LOW
Glucose, Bld: 107 mg/dL — ABNORMAL HIGH (ref 65–99)
Potassium: 3.2 mmol/L — ABNORMAL LOW (ref 3.5–5.1)
Sodium: 139 mmol/L (ref 135–145)

## 2015-05-28 LAB — URINALYSIS, ROUTINE W REFLEX MICROSCOPIC
Bilirubin Urine: NEGATIVE
Glucose, UA: NEGATIVE mg/dL
Hgb urine dipstick: NEGATIVE
Ketones, ur: NEGATIVE mg/dL
Leukocytes, UA: NEGATIVE
Nitrite: NEGATIVE
Protein, ur: NEGATIVE mg/dL
Specific Gravity, Urine: 1.011 (ref 1.005–1.030)
pH: 6 (ref 5.0–8.0)

## 2015-05-28 LAB — I-STAT TROPONIN, ED: Troponin i, poc: 0.01 ng/mL (ref 0.00–0.08)

## 2015-05-28 LAB — TROPONIN I: Troponin I: <0.03 ng/mL

## 2015-05-28 LAB — PROTIME-INR
INR: 1.42 (ref 0.00–1.49)
Prothrombin Time: 17.4 s — ABNORMAL HIGH (ref 11.6–15.2)

## 2015-05-28 LAB — BRAIN NATRIURETIC PEPTIDE: B Natriuretic Peptide: 74.6 pg/mL (ref 0.0–100.0)

## 2015-05-28 MED ORDER — RIVAROXABAN 15 MG PO TABS
15.0000 mg | ORAL_TABLET | Freq: Once | ORAL | Status: AC
  Administered 2015-05-28: 15 mg via ORAL
  Filled 2015-05-28: qty 1

## 2015-05-28 MED ORDER — RIVAROXABAN (XARELTO) VTE STARTER PACK (15 & 20 MG): ORAL_TABLET | ORAL | Status: DC

## 2015-05-28 MED ORDER — HYDROMORPHONE HCL 1 MG/ML IJ SOLN
1.0000 mg | Freq: Once | INTRAMUSCULAR | Status: AC
  Administered 2015-05-28: 1 mg via INTRAVENOUS
  Filled 2015-05-28: qty 1

## 2015-05-28 MED ORDER — OXYCODONE HCL 5 MG PO TABS
5.0000 mg | ORAL_TABLET | Freq: Once | ORAL | Status: AC
  Administered 2015-05-28: 5 mg via ORAL
  Filled 2015-05-28: qty 1

## 2015-05-28 NOTE — ED Provider Notes (Signed)
CSN: [id number]     Arrival date & time 05/28/15  1906 History   First MD Initiated Contact with Patient 05/28/15 1923     Chief Complaint  Patient presents with  . Shortness of Breath  . Flank Pain    HPI Patient with history of COPD, cirrhosis, DVT, cavitary pneumonia, polysubstance abuse, hepatitis suggesting need for evaluation of right lower chest chest pain/right flank pain. Symptoms have been unchanged for many days. She is concerned she might have a kidney stone and she needs help arranging outpatient medications. She was just discharged yesterday for bilateral PE without evidence of right heart strain on echo and biomarker testing. She denies shortness of breath, cough, hemoptysis sputum like swelling leg pain, exertional chest pain, syncope. She states she tried to fill her xarelto medication today but was not authorized. She denies dysuria/hematuria/frequency.    Past Medical History  Diagnosis Date  . COPD (chronic obstructive pulmonary disease) ([place])   . Cirrhosis ([place])   . Pulmonary emboli ([place]) 09/2013  . Kidney stones   . Heart murmur   . DVT (deep venous thrombosis) ([place]) 09/2013    RLE  . Pneumonia     "4 times in the past year" (10/30/2013)  . History of blood transfusion     "due to excessive blood loss before hysterectomy"  . GERD (gastroesophageal reflux disease)   . Hepatitis B   . Arthritis     "knees; lower back" (10/30/2013)  . Anxiety   . Depression   . Bipolar disorder ([place])   . Hypertension     currently on no medication   . Shortness of breath dyspnea     increased exertion;exercise  . Agoraphobia with panic attacks   . History of urinary tract infection   . Urinary incontinence   . Urinary frequency   . Altered mental state 10/26/2013  . Ankle fracture, left 11/27/2010    S/p ORIF 11/2   . Cavitary pneumonia   . CKD (chronic kidney disease), stage III    Past Surgical History  Procedure Laterality Date  . Carpal tunnel release Left   . Fracture  surgery    . Ankle fracture surgery Left   . Abdominal hysterectomy    . Tubal ligation    . Dilation and curettage of uterus  X 2  . Hand surgery      1980's/left hand   . Laparoscopic assisted ventral hernia repair N/A 12/10/2014    Procedure: LAPAROSCOPIC ASSISTED REPAIR OF INCARCERATED UMBILICAL HERNIA ;  Surgeon: Michael Boston, MD;  Location: [facility];  Service: General;  Laterality: N/A;  . Insertion of mesh N/A 12/10/2014    Procedure: INSERTION OF MESH;  Surgeon: Michael Boston, MD;  Location: [facility];  Service: General;  Laterality: N/A;   Family History  Problem Relation Age of Onset  . Stroke Mother   . Lung cancer Father    Social History  Substance Use Topics  . Smoking status: Former Smoker -- 1.00 packs/day for 35 years    Types: Cigarettes    Quit date: 10/26/2013  . Smokeless tobacco: Never Used  . Alcohol Use: No     Comment: "stopped drinking  in 2004"   OB History    No data available     Review of Systems  Constitutional: Negative for fever, chills and diaphoresis.  HENT: Negative for congestion.   Respiratory: Positive for wheezing. Negative for cough and shortness of breath.   Cardiovascular: Positive for chest pain. Negative  for leg swelling.  Gastrointestinal: Positive for abdominal pain. Negative for nausea and vomiting.  Endocrine: Negative for polyuria.  Genitourinary: Negative for dysuria, urgency, frequency, difficulty urinating and pelvic pain.  Musculoskeletal: Negative for back pain.  Skin: Negative for rash.  Neurological: Negative for syncope and light-headedness.  All other systems reviewed and are negative.  Allergies  Heparin; Amitriptyline hcl; Gabapentin; Cymbalta; Elavil; and Neomycin-bacitracin zn-polymyx  Home Medications   Prior to Admission medications   Medication Sig Start Date End Date Taking? Authorizing Provider  ALPRAZolam Duanne Moron) 1 MG tablet Take 1 mg by mouth 3 (three) times daily as needed for anxiety.   Yes Historical  Provider, MD  budesonide-formoterol (SYMBICORT) 80-4.5 MCG/ACT inhaler Inhale 2 puffs into the lungs 2 (two) times daily. 05/27/15  Yes Mir Marry Guan, MD  ciprofloxacin (CIPRO) 250 MG tablet Take 1 tablet (250 mg total) by mouth 2 (two) times daily. 05/27/15  Yes Mir Marry Guan, MD  citalopram (CELEXA) 20 MG tablet Take 20 mg by mouth at bedtime.   Yes Historical Provider, MD  doxepin (SINEQUAN) 25 MG capsule Take 2 capsules by mouth at bedtime. 05/27/15  Yes Historical Provider, MD  entecavir (BARACLUDE) 0.5 MG tablet Take 0.5 mg by mouth every other day.    Yes Historical Provider, MD  ibuprofen (ADVIL,MOTRIN) 200 MG tablet Take 600 mg by mouth every 6 (six) hours as needed for headache or mild pain.   Yes Historical Provider, MD  Iloperidone (FANAPT) 6 MG TABS Take 12 mg by mouth every morning.    Yes Historical Provider, MD  oxyCODONE (OXY IR/ROXICODONE) 5 MG immediate release tablet Take 1 tablet (5 mg total) by mouth every 4 (four) hours as needed for moderate pain or breakthrough pain. 05/27/15  Yes Mir Marry Guan, MD  pantoprazole (PROTONIX) 40 MG tablet Take 40 mg by mouth daily. 12/23/13  Yes Historical Provider, MD  PROAIR HFA 108 (90 Base) MCG/ACT inhaler INHALE 2 PUFFS INTO THE LUNGS EVERY 6 HOURS AS NEEDED FOR WHEEZING OR SHORTNESS OF BREATH. 02/01/15  Yes Rigoberto Noel, MD  Spacer/Aero-Holding Chambers (AEROCHAMBER MV) inhaler Use as instructed 10/08/14  Yes Rigoberto Noel, MD  spironolactone (ALDACTONE) 50 MG tablet Take 100 mg by mouth every morning. 03/10/15  Yes Historical Provider, MD  temazepam (RESTORIL) 30 MG capsule Take 30 mg by mouth at bedtime. 09/14/14  Yes Historical Provider, MD  traMADol (ULTRAM) 50 MG tablet Take 1-2 tablets (50-100 mg total) by mouth every 6 (six) hours as needed for moderate pain or severe pain. 05/27/15  Yes Mir Marry Guan, MD  oxyCODONE (OXY IR/ROXICODONE) 5 MG immediate release tablet Take 1-2 tablets (5-10 mg total) by mouth  every 6 (six) hours as needed for moderate pain, severe pain or breakthrough pain. Patient not taking: Reported on 05/25/2015 12/11/14   Michael Boston, MD  Rivaroxaban 15 & 20 MG TBPK Take as directed on package: Start with one 15mg  tablet by mouth twice a day with food. On Day 22, switch to one 20mg  tablet once a day with food. 05/28/15   Tammy Sours, MD   BP 152/93 mmHg  Pulse 92  Temp(Src) 98 F (36.7 C) (Oral)  Resp 23  Ht 5\' 4"  (1.626 m)  Wt 108.863 kg  BMI 41.18 kg/m2  SpO2 96% Physical Exam  Constitutional: She is oriented to person, place, and time. She appears well-developed and well-nourished. No distress.  nml effort, NAD  HENT:  Head: Normocephalic and atraumatic.  Nose: Nose normal.  No oral lesions/ swelling or erythema or exudates  Eyes: Conjunctivae are normal. Pupils are equal, round, and reactive to light.  Neck: Normal range of motion. Neck supple. No JVD present. No tracheal deviation present.  Cardiovascular: Normal rate, regular rhythm and normal heart sounds.   No murmur heard. Pulmonary/Chest: Effort normal. No respiratory distress. She has no wheezes. She has rales (coarse diffusely).  nml exp phase  Abdominal: Soft. Bowel sounds are normal. She exhibits no distension and no mass. There is no tenderness.  Neg cvat. Neg murphys  Musculoskeletal: Normal range of motion. She exhibits no edema or tenderness.  No lower extremity edema, calf tenderness, warmth, erythema or palpable cords   Neurological: She is alert and oriented to person, place, and time.  Skin: Skin is warm and dry. No rash noted.  Psychiatric: She has a normal mood and affect.  Nursing note and vitals reviewed.   ED Course  Procedures (including critical care time) Labs Review Labs Reviewed  BASIC METABOLIC PANEL - Abnormal; Notable for the following:    Potassium 3.2 (*)    Glucose, Bld 107 (*)    Creatinine, Ser 1.15 (*)    GFR calc non Af Amer 51 (*)    GFR calc Af Amer 60 (*)    All  other components within normal limits  CBC - Abnormal; Notable for the following:    WBC 2.1 (*)    RBC 3.54 (*)    Hemoglobin 10.8 (*)    HCT 32.5 (*)    Platelets 92 (*)    All other components within normal limits  PROTIME-INR - Abnormal; Notable for the following:    Prothrombin Time 17.4 (*)    All other components within normal limits  TROPONIN I  BRAIN NATRIURETIC PEPTIDE  URINALYSIS, ROUTINE W REFLEX MICROSCOPIC (NOT AT [facility])  Randolm Idol, ED    Imaging Review Dg Chest 2 View  05/28/2015  CLINICAL DATA:  60 year old female with shortness of breath and chest pain for 4 days with recent diagnosis of pulmonary emboli. EXAM: CHEST  2 VIEW COMPARISON:  05/25/2015 CT.  12/04/2014 and prior chest radiographs. FINDINGS: The cardiomediastinal silhouette is unchanged with mild cardiomegaly. Small bilateral pleural effusions are identified with mild bibasilar atelectasis. There is no evidence of pulmonary edema, pneumothorax or pulmonary mass. No acute bony abnormalities are noted. IMPRESSION: Small bilateral pleural effusions and mild bibasilar atelectasis. Electronically Signed   By: Margarette Canada M.D.   On: 05/28/2015 20:18   I have personally reviewed and evaluated these images and lab results as part of my medical decision-making.   EKG Interpretation   Date/Time:  Friday May 28 2015 19:13:13 EDT Ventricular Rate:  79 PR Interval:  151 QRS Duration: 99 QT Interval:  387 QTC Calculation: 444 R Axis:   7 Text Interpretation:  Sinus rhythm Abnormal R-wave progression, early  transition Sinus rhythm No significant change since last tracing Normal  ECG Confirmed by Carmin Muskrat  MD ([id number]) on 05/28/2015 7:27:40 PM      MDM   Final diagnoses:  Right flank pain  Other acute pulmonary embolism without acute cor pulmonale (HCC)    Review course. Echo without evidence of right heart strain. EKG without ischemic insult or evidence of right heart strain pattern. Vital signs  stable noted submassive PE at this time. UA negative for hematuria exam not typical of renal colic. Reviewed recent CT abdomen and pelvis without acute pathology, no ureteral stones only intra-renal. CT wo evidence of  renal infarct or aneurysm. Lungs exam without evidence of COPD exacerbation as detailed above. Chest x-ray wo PTX or PNA.  No evidence of heart strain on biomarkers.  She remained very well appearing during my course of care. Vitals remained stable.  No O2 requirement.  She has good outpt follow up.  Counseled on return precautions.  Wrote for Xarelto since she said the previous one didn't work.  If she has insurance issues, she will speak to her PCP who has already been made aware of her prior issues today.    Tammy Sours, MD 05/28/15 [place], MD 05/28/15 [phone]

## 2015-05-28 NOTE — ED Notes (Signed)
Went to draw blood and administer meds, patient off the unit for testing.

## 2015-05-28 NOTE — ED Notes (Signed)
Per [facility]: Pt called out for shortness of breath. EMS states that they heard rhonchi in right lower lobe. Pt had 2.5 of albuterol prior to EMS arrival, this was given by the fire department and used her personal pro air prior to EMS arrival. Also complaining of right flank pain, pt believes that this is kidney stones. PT states that she was told that she had kidney stones last night and "they told me I had blood clots in my left lung and my left leg".

## 2015-05-30 ENCOUNTER — Observation Stay (HOSPITAL_COMMUNITY)
Admission: EM | Admit: 2015-05-30 | Discharge: 2015-06-02 | Disposition: A | Discharge status: Home / Self Care | Payer: Medicare Other | Attending: Internal Medicine | Admitting: Internal Medicine

## 2015-05-30 ENCOUNTER — Encounter (HOSPITAL_COMMUNITY): Payer: Self-pay | Admitting: Emergency Medicine

## 2015-05-30 ENCOUNTER — Emergency Department (HOSPITAL_COMMUNITY): Payer: Medicare Other

## 2015-05-30 DIAGNOSIS — F319 Bipolar disorder, unspecified: Secondary | ICD-10-CM | POA: Diagnosis not present

## 2015-05-30 DIAGNOSIS — Z87891 Personal history of nicotine dependence: Secondary | ICD-10-CM | POA: Diagnosis not present

## 2015-05-30 DIAGNOSIS — R000 Tachycardia, unspecified: Secondary | ICD-10-CM | POA: Insufficient documentation

## 2015-05-30 DIAGNOSIS — Z86718 Personal history of other venous thrombosis and embolism: Secondary | ICD-10-CM | POA: Insufficient documentation

## 2015-05-30 DIAGNOSIS — N183 Chronic kidney disease, stage 3 (moderate): Secondary | ICD-10-CM | POA: Diagnosis not present

## 2015-05-30 DIAGNOSIS — Z8701 Personal history of pneumonia (recurrent): Secondary | ICD-10-CM | POA: Diagnosis not present

## 2015-05-30 DIAGNOSIS — N179 Acute kidney failure, unspecified: Secondary | ICD-10-CM

## 2015-05-30 DIAGNOSIS — J189 Pneumonia, unspecified organism: Secondary | ICD-10-CM

## 2015-05-30 DIAGNOSIS — I2699 Other pulmonary embolism without acute cor pulmonale: Secondary | ICD-10-CM | POA: Diagnosis present

## 2015-05-30 DIAGNOSIS — J441 Chronic obstructive pulmonary disease with (acute) exacerbation: Principal | ICD-10-CM | POA: Insufficient documentation

## 2015-05-30 DIAGNOSIS — F419 Anxiety disorder, unspecified: Secondary | ICD-10-CM | POA: Diagnosis not present

## 2015-05-30 DIAGNOSIS — D72819 Decreased white blood cell count, unspecified: Secondary | ICD-10-CM | POA: Insufficient documentation

## 2015-05-30 DIAGNOSIS — I129 Hypertensive chronic kidney disease with stage 1 through stage 4 chronic kidney disease, or unspecified chronic kidney disease: Secondary | ICD-10-CM | POA: Insufficient documentation

## 2015-05-30 DIAGNOSIS — R0602 Shortness of breath: Secondary | ICD-10-CM | POA: Diagnosis present

## 2015-05-30 DIAGNOSIS — Z86711 Personal history of pulmonary embolism: Secondary | ICD-10-CM | POA: Diagnosis not present

## 2015-05-30 DIAGNOSIS — R5383 Other fatigue: Secondary | ICD-10-CM | POA: Diagnosis not present

## 2015-05-30 DIAGNOSIS — M1990 Unspecified osteoarthritis, unspecified site: Secondary | ICD-10-CM | POA: Diagnosis not present

## 2015-05-30 DIAGNOSIS — K219 Gastro-esophageal reflux disease without esophagitis: Secondary | ICD-10-CM | POA: Insufficient documentation

## 2015-05-30 DIAGNOSIS — F4001 Agoraphobia with panic disorder: Secondary | ICD-10-CM | POA: Diagnosis not present

## 2015-05-30 DIAGNOSIS — R011 Cardiac murmur, unspecified: Secondary | ICD-10-CM | POA: Insufficient documentation

## 2015-05-30 DIAGNOSIS — J159 Unspecified bacterial pneumonia: Secondary | ICD-10-CM | POA: Diagnosis not present

## 2015-05-30 DIAGNOSIS — J181 Lobar pneumonia, unspecified organism: Secondary | ICD-10-CM

## 2015-05-30 DIAGNOSIS — Z7901 Long term (current) use of anticoagulants: Secondary | ICD-10-CM | POA: Diagnosis not present

## 2015-05-30 DIAGNOSIS — J449 Chronic obstructive pulmonary disease, unspecified: Secondary | ICD-10-CM | POA: Diagnosis present

## 2015-05-30 DIAGNOSIS — D696 Thrombocytopenia, unspecified: Secondary | ICD-10-CM | POA: Diagnosis present

## 2015-05-30 DIAGNOSIS — K7460 Unspecified cirrhosis of liver: Secondary | ICD-10-CM | POA: Diagnosis present

## 2015-05-30 DIAGNOSIS — Z8781 Personal history of (healed) traumatic fracture: Secondary | ICD-10-CM | POA: Insufficient documentation

## 2015-05-30 DIAGNOSIS — R42 Dizziness and giddiness: Secondary | ICD-10-CM | POA: Diagnosis not present

## 2015-05-30 LAB — CBC
HCT: 32.2 % — ABNORMAL LOW (ref 36.0–46.0)
Hemoglobin: 10.5 g/dL — ABNORMAL LOW (ref 12.0–15.0)
MCH: 29.3 pg (ref 26.0–34.0)
MCHC: 32.6 g/dL (ref 30.0–36.0)
MCV: 89.9 fL (ref 78.0–100.0)
PLATELETS: 85 10*3/uL — AB (ref 150–400)
RBC: 3.58 MIL/uL — ABNORMAL LOW (ref 3.87–5.11)
RDW: 13 % (ref 11.5–15.5)
WBC: 1.8 10*3/uL — ABNORMAL LOW (ref 4.0–10.5)

## 2015-05-30 LAB — BASIC METABOLIC PANEL
Anion gap: 12 (ref 5–15)
BUN: 12 mg/dL (ref 6–20)
CHLORIDE: 108 mmol/L (ref 101–111)
CO2: 22 mmol/L (ref 22–32)
CREATININE: 1.2 mg/dL — AB (ref 0.44–1.00)
Calcium: 9.4 mg/dL (ref 8.9–10.3)
GFR calc Af Amer: 57 mL/min — ABNORMAL LOW (ref >60)
GFR calc non Af Amer: 49 mL/min — ABNORMAL LOW (ref >60)
Glucose, Bld: 154 mg/dL — ABNORMAL HIGH (ref 65–99)
Potassium: 3.4 mmol/L — ABNORMAL LOW (ref 3.5–5.1)
Sodium: 142 mmol/L (ref 135–145)

## 2015-05-30 LAB — I-STAT TROPONIN, ED: Troponin i, poc: 0.01 ng/mL (ref 0.00–0.08)

## 2015-05-30 LAB — I-STAT CG4 LACTIC ACID, ED: Lactic Acid, Venous: 2.22 mmol/L (ref 0.5–2.0)

## 2015-05-30 MED ORDER — IOPAMIDOL (ISOVUE-370) INJECTION 76%
80.0000 mL | Freq: Once | INTRAVENOUS | Status: AC | PRN
  Administered 2015-05-30: 80 mL via INTRAVENOUS

## 2015-05-30 MED ORDER — IPRATROPIUM BROMIDE 0.02 % IN SOLN
0.5000 mg | Freq: Once | RESPIRATORY_TRACT | Status: AC
  Administered 2015-05-30: 0.5 mg via RESPIRATORY_TRACT
  Filled 2015-05-30: qty 2.5

## 2015-05-30 MED ORDER — ACETAMINOPHEN 500 MG PO TABS
1000.0000 mg | ORAL_TABLET | Freq: Once | ORAL | Status: AC
  Administered 2015-05-30: 1000 mg via ORAL
  Filled 2015-05-30: qty 2

## 2015-05-30 MED ORDER — VANCOMYCIN HCL IN DEXTROSE 1-5 GM/200ML-% IV SOLN
1000.0000 mg | Freq: Once | INTRAVENOUS | Status: AC
  Administered 2015-05-31: 1000 mg via INTRAVENOUS
  Filled 2015-05-30: qty 200

## 2015-05-30 MED ORDER — RIVAROXABAN (XARELTO) EDUCATION KIT FOR DVT/PE PATIENTS
PACK | Freq: Once | Status: AC
  Administered 2015-05-30: 22:00:00
  Filled 2015-05-30: qty 1

## 2015-05-30 MED ORDER — RIVAROXABAN 15 MG PO TABS
15.0000 mg | ORAL_TABLET | Freq: Two times a day (BID) | ORAL | Status: DC
  Administered 2015-05-30 – 2015-06-02 (×6): 15 mg via ORAL
  Filled 2015-05-30 (×7): qty 1

## 2015-05-30 MED ORDER — ALBUTEROL SULFATE (2.5 MG/3ML) 0.083% IN NEBU
5.0000 mg | INHALATION_SOLUTION | Freq: Once | RESPIRATORY_TRACT | Status: AC
  Administered 2015-05-30: 5 mg via RESPIRATORY_TRACT
  Filled 2015-05-30: qty 6

## 2015-05-30 MED ORDER — DEXTROSE 5 % IV SOLN
2.0000 g | Freq: Once | INTRAVENOUS | Status: AC
  Administered 2015-05-30: 2 g via INTRAVENOUS
  Filled 2015-05-30: qty 2

## 2015-05-30 MED ORDER — SODIUM CHLORIDE 0.9 % IV BOLUS (SEPSIS)
1000.0000 mL | Freq: Once | INTRAVENOUS | Status: AC
  Administered 2015-05-30: 1000 mL via INTRAVENOUS

## 2015-05-30 NOTE — ED Notes (Signed)
Patient comes from home states started having sudden onset of SOB and Chest Pain. Patient rates pain 6/10 non radiating. On arrival Wheezing noted. EMS gave 10 Albuterol and 1mg  Atrovent. Patient also states she was Dx with kideny stones bilateral and states having flank pain.

## 2015-05-30 NOTE — Discharge Instructions (Addendum)

## 2015-05-30 NOTE — ED Provider Notes (Signed)
CSN: [id number]     Arrival date & time 05/30/15  2116 History   First MD Initiated Contact with Patient 05/30/15 2129     Chief Complaint  Patient presents with  . Shortness of Breath  . Chest Pain  . Flank Pain     (Consider location/radiation/quality/duration/timing/severity/associated sxs/prior Treatment) Patient is a 60 y.o. female presenting with general illness. The history is provided by the patient and medical records.  Illness Location:  SOB Severity:  Moderate Onset quality:  Gradual Duration:  5 days Timing:  Constant Progression:  Worsening Chronicity:  New Context:  Hx of COPD, recent PE. Has not been taking Xarelto as prescribed. Was recently admitted for the PE. Had another visit to the emergency department and given another prescription for Xarelto. Fever yesterday. Worsening shortness of breath. Associated symptoms: chest pain, cough, fatigue, fever, shortness of breath and wheezing   Associated symptoms: no abdominal pain, no diarrhea, no headaches, no nausea, no rash and no vomiting     Past Medical History  Diagnosis Date  . COPD (chronic obstructive pulmonary disease) ([place])   . Cirrhosis ([place])   . Pulmonary emboli ([place]) 09/2013  . Kidney stones   . Heart murmur   . DVT (deep venous thrombosis) ([place]) 09/2013    RLE  . Pneumonia     "4 times in the past year" (10/30/2013)  . History of blood transfusion     "due to excessive blood loss before hysterectomy"  . GERD (gastroesophageal reflux disease)   . Hepatitis B   . Arthritis     "knees; lower back" (10/30/2013)  . Anxiety   . Depression   . Bipolar disorder ([place])   . Hypertension     currently on no medication   . Shortness of breath dyspnea     increased exertion;exercise  . Agoraphobia with panic attacks   . History of urinary tract infection   . Urinary incontinence   . Urinary frequency   . Altered mental state 10/26/2013  . Ankle fracture, left 11/27/2010    S/p ORIF 11/2   . Cavitary  pneumonia   . CKD (chronic kidney disease), stage III    Past Surgical History  Procedure Laterality Date  . Carpal tunnel release Left   . Fracture surgery    . Ankle fracture surgery Left   . Abdominal hysterectomy    . Tubal ligation    . Dilation and curettage of uterus  X 2  . Hand surgery      1980's/left hand   . Laparoscopic assisted ventral hernia repair N/A 12/10/2014    Procedure: LAPAROSCOPIC ASSISTED REPAIR OF INCARCERATED UMBILICAL HERNIA ;  Surgeon: Michael Boston, MD;  Location: [facility];  Service: General;  Laterality: N/A;  . Insertion of mesh N/A 12/10/2014    Procedure: INSERTION OF MESH;  Surgeon: Michael Boston, MD;  Location: [facility];  Service: General;  Laterality: N/A;   Family History  Problem Relation Age of Onset  . Stroke Mother   . Lung cancer Father    Social History  Substance Use Topics  . Smoking status: Former Smoker -- 1.00 packs/day for 35 years    Types: Cigarettes    Quit date: 10/26/2013  . Smokeless tobacco: Never Used  . Alcohol Use: No     Comment: "stopped drinking  in 2004"   OB History    No data available     Review of Systems  Constitutional: Positive for fever, chills and fatigue.  Respiratory:  Positive for cough, shortness of breath and wheezing.   Cardiovascular: Positive for chest pain. Negative for leg swelling.  Gastrointestinal: Negative for nausea, vomiting, abdominal pain, diarrhea and blood in stool.  Genitourinary: Positive for flank pain (pleuritic). Negative for dysuria.  Musculoskeletal: Positive for back pain.  Skin: Negative for rash.  Neurological: Positive for light-headedness. Negative for headaches.  All other systems reviewed and are negative.     Allergies  Heparin; Amitriptyline hcl; Gabapentin; Cymbalta; Elavil; and Neomycin-bacitracin zn-polymyx  Home Medications   Prior to Admission medications   Medication Sig Start Date End Date Taking? Authorizing Provider  ALPRAZolam Duanne Moron) 1 MG tablet  Take 1 mg by mouth 3 (three) times daily as needed for anxiety.   Yes Historical Provider, MD  budesonide-formoterol (SYMBICORT) 80-4.5 MCG/ACT inhaler Inhale 2 puffs into the lungs 2 (two) times daily. 05/27/15  Yes Mir Marry Guan, MD  ciprofloxacin (CIPRO) 250 MG tablet Take 1 tablet (250 mg total) by mouth 2 (two) times daily. 05/27/15  Yes Mir Marry Guan, MD  citalopram (CELEXA) 20 MG tablet Take 20 mg by mouth at bedtime.   Yes Historical Provider, MD  doxepin (SINEQUAN) 25 MG capsule Take 2 capsules by mouth at bedtime. 05/27/15  Yes Historical Provider, MD  entecavir (BARACLUDE) 0.5 MG tablet Take 0.5 mg by mouth every other day.    Yes Historical Provider, MD  ibuprofen (ADVIL,MOTRIN) 200 MG tablet Take 600 mg by mouth every 6 (six) hours as needed for headache or mild pain.   Yes Historical Provider, MD  Iloperidone (FANAPT) 6 MG TABS Take 12 mg by mouth every morning.    Yes Historical Provider, MD  oxyCODONE (OXY IR/ROXICODONE) 5 MG immediate release tablet Take 1 tablet (5 mg total) by mouth every 4 (four) hours as needed for moderate pain or breakthrough pain. 05/27/15  Yes Mir Marry Guan, MD  pantoprazole (PROTONIX) 40 MG tablet Take 40 mg by mouth daily. 12/23/13  Yes Historical Provider, MD  PROAIR HFA 108 (90 Base) MCG/ACT inhaler INHALE 2 PUFFS INTO THE LUNGS EVERY 6 HOURS AS NEEDED FOR WHEEZING OR SHORTNESS OF BREATH. 02/01/15  Yes Rigoberto Noel, MD  Rivaroxaban 15 & 20 MG TBPK Take as directed on package: Start with one 15mg  tablet by mouth twice a day with food. On Day 22, switch to one 20mg  tablet once a day with food. 05/28/15  Yes Tammy Sours, MD  spironolactone (ALDACTONE) 50 MG tablet Take 100 mg by mouth every morning. 03/10/15  Yes Historical Provider, MD  temazepam (RESTORIL) 30 MG capsule Take 30 mg by mouth at bedtime. 09/14/14  Yes Historical Provider, MD  traMADol (ULTRAM) 50 MG tablet Take 1-2 tablets (50-100 mg total) by mouth every 6 (six) hours as  needed for moderate pain or severe pain. 05/27/15  Yes Mir Marry Guan, MD  oxyCODONE (OXY IR/ROXICODONE) 5 MG immediate release tablet Take 1-2 tablets (5-10 mg total) by mouth every 6 (six) hours as needed for moderate pain, severe pain or breakthrough pain. Patient not taking: Reported on 05/25/2015 12/11/14   Michael Boston, MD  Spacer/Aero-Holding Chambers (AEROCHAMBER MV) inhaler Use as instructed 10/08/14   Rigoberto Noel, MD   BP 103/68 mmHg  Pulse 98  Temp(Src) 97.7 F (36.5 C) (Oral)  Resp 17  Ht 5\' 4"  (1.626 m)  Wt 104.327 kg  BMI 39.46 kg/m2  SpO2 93% Physical Exam  Constitutional: She is oriented to person, place, and time. She appears well-developed and well-nourished. She appears distressed.  HENT:  Head: Normocephalic and atraumatic.  Mouth/Throat: Mucous membranes are not dry.  Eyes: EOM are normal. Pupils are equal, round, and reactive to light.  Neck: Normal range of motion.  Cardiovascular: Regular rhythm.  Tachycardia present.   Pulmonary/Chest: Tachypnea noted. She is in respiratory distress. She has wheezes. She has rhonchi.  Abdominal: Soft. Normal appearance. There is no tenderness. There is no rebound, no guarding, no CVA tenderness, no tenderness at McBurney's point and negative Murphy's sign.  Musculoskeletal:       Right lower leg: She exhibits no swelling and no edema.       Left lower leg: She exhibits no swelling and no edema.  Neurological: She is alert and oriented to person, place, and time. GCS eye subscore is 4. GCS verbal subscore is 5. GCS motor subscore is 6.  Skin: Skin is warm and dry.    ED Course  Procedures (including critical care time) Labs Review Labs Reviewed  BASIC METABOLIC PANEL - Abnormal; Notable for the following:    Potassium 3.4 (*)    Glucose, Bld 154 (*)    Creatinine, Ser 1.20 (*)    GFR calc non Af Amer 49 (*)    GFR calc Af Amer 57 (*)    All other components within normal limits  CBC - Abnormal; Notable for the  following:    WBC 1.8 (*)    RBC 3.58 (*)    Hemoglobin 10.5 (*)    HCT 32.2 (*)    Platelets 85 (*)    All other components within normal limits  I-STAT CG4 LACTIC ACID, ED - Abnormal; Notable for the following:    Lactic Acid, Venous 2.22 (*)    All other components within normal limits  CULTURE, BLOOD (ROUTINE X 2)  CULTURE, BLOOD (ROUTINE X 2)  CULTURE, EXPECTORATED SPUTUM-ASSESSMENT  GRAM STAIN  URINALYSIS, ROUTINE W REFLEX MICROSCOPIC (NOT AT [facility])  DIC (DISSEMINATED INTRAVASCULAR COAGULATION) PANEL  HIV ANTIBODY (ROUTINE TESTING)  STREP PNEUMONIAE URINARY ANTIGEN  CBC  BASIC METABOLIC PANEL  I-STAT TROPOININ, ED    Imaging Review Dg Chest 2 View  05/30/2015  CLINICAL DATA:  60 year old female with shortness of breath and chest pain EXAM: CHEST  2 VIEW COMPARISON:  Chest radiograph dated 05/28/2015 FINDINGS: Two views of the chest demonstrate stable appearing small bilateral pleural effusions with bibasilar platelike atelectatic changes. There is no focal consolidation or pneumothorax. Stable cardiac silhouette. No acute osseous pathology. IMPRESSION: Small bilateral pleural effusions with no significant interval change. Electronically Signed   By: Anner Crete M.D.   On: 05/30/2015 22:09   Ct Angio Chest Pe W/cm &/or Wo Cm  05/31/2015  CLINICAL DATA:  60 year old female with chest pain and shortness of breath. EXAM: CT ANGIOGRAPHY CHEST WITH CONTRAST TECHNIQUE: Multidetector CT imaging of the chest was performed using the standard protocol during bolus administration of intravenous contrast. Multiplanar CT image reconstructions and MIPs were obtained to evaluate the vascular anatomy. CONTRAST:  80 cc Isovue 370 COMPARISON:  Chest radiograph dated 05/30/2015 and chest CT dated 05/25/2015 FINDINGS: There small bilateral pleural effusions grossly similar to prior study. Bibasilar linear atelectasis/ scarring noted. There are small subpleural areas of ground-glass and nodular density  at the right lung base, likely representing areas of pulmonary infarct. An infectious process is not excluded. There are small patchy areas of ground-glass and nodular density in the right upper lobe which may represent areas of infarct or atypical pneumonia. Correlation with clinical exam and sputum cultures  recommended. There is a 9 mm nodular density in the right upper lobe which appears new compared the prior study. A stable area of linear scarring noted extending from the right hilum into the right apex. The central airways are patent. The thoracic aorta appears unremarkable. The visualized portions of the origins of the great vessels are patent. There has been interval resolution of the previously seen pulmonary embolism. Evaluation of the pulmonary arteries is somewhat limited due to suboptimal opacification of the distal branches. No CT evidence of central pulmonary artery embolus identified. There is enlargement of the main pulmonary trunk suggestive of a degree of pulmonary hypertension. A a there is no cardiomegaly or pericardial effusion. Top-normal right hilar lymph node similar to prior study. There is no mediastinal adenopathy. The esophagus and the thyroid gland appear grossly unremarkable. There is no axillary adenopathy. The chest wall soft tissues appear unremarkable. There is mild degenerative changes of the spine. No acute fracture. Cirrhosis of the liver.  Splenomegaly. Review of the MIP images confirms the above findings. IMPRESSION: No CT evidence of pulmonary embolism. Interval resolution of the previously seen pulmonary embolism. Small patchy areas of ground-glass density at the right lung base peripherally as well as in the right upper lobe may represent areas of pulmonary infarct since versus less likely infectious process. A 9 mm right upper lobe pulmonary nodule, new from prior study, and likely inflammatory/infectious in etiology. Small bilateral pleural effusions. Electronically  Signed   By: Anner Crete M.D.   On: 05/31/2015 00:05   I have personally reviewed and evaluated these images and lab results as part of my medical decision-making.   EKG Interpretation   Date/Time:  Sunday May 30 2015 21:25:23 EDT Ventricular Rate:  92 PR Interval:  148 QRS Duration: 99 QT Interval:  406 QTC Calculation: 502 R Axis:   7 Text Interpretation:  Sinus rhythm Borderline prolonged QT interval  Nonspecific T wave abnormality Confirmed by Ashok Cordia  MD, Lennette Bihari ([id number]) on  05/30/2015 9:27:49 PM      MDM   Final diagnoses:  None    Patient with a history of COPD, recent diagnosis of PE, CK D, presenting with shortness of breath.  Patient was recently admitted. Discharged in the last few days. Was diagnosed with bilateral pulmonary emboli and at that time. Was supposed to start taking Xarelto; however she did not start this. Returned 2 days later with persistence with shortness of breath. Given a new prescription for this. Did not fill this either.   Yesterday the patient developed a fever to MAXIMUM TEMPERATURE 101. Endorses worsening cough and shortness of breath. Endorses wheezing. Presented here to the emergency department for worsening shortness of breath.  On arrival the patient is to Into the 30s. She is afebrile. She is normotensive, slightly tachycardic. Sats are in the low 90s; on chart review it looks like she normally has saturations in the mid 90s. She has diffusely rhonchorous breath sounds. Wheezing.  Patient leukopenia to 1.8. This appears to be new. Thrombocytopenia to 85; this is consistent with her baseline. With the recent fever as well as tachypnea here concern for sepsis with potential pulmonary source. Starting on HCAP Antibiotics. Sending off a lactate and blood cultures. We'll also start the patient again on her Xarelto. Given the patient breathing treatment for her wheezing. Giving IV fluids. Patient will require admission for failed outpatient  therapy.  11:23 PM Per hospitalist request, placing order for CT PE study to further investigate degree of clot  burden at this time; DIC panel; UA.   12:40 AM CT showed no evidence of new pulmonary embolism. Showed likely infectious pathology. Will admit to the hospitalist for further intervention and observation.  Patient admitted in stable condition.  Maryan Puls, MD 05/31/15 [id number]  Lajean Saver, MD 05/31/15 1004

## 2015-05-31 DIAGNOSIS — J181 Lobar pneumonia, unspecified organism: Secondary | ICD-10-CM

## 2015-05-31 DIAGNOSIS — I2699 Other pulmonary embolism without acute cor pulmonale: Secondary | ICD-10-CM

## 2015-05-31 DIAGNOSIS — J432 Centrilobular emphysema: Secondary | ICD-10-CM

## 2015-05-31 DIAGNOSIS — K7460 Unspecified cirrhosis of liver: Secondary | ICD-10-CM | POA: Diagnosis not present

## 2015-05-31 DIAGNOSIS — D696 Thrombocytopenia, unspecified: Secondary | ICD-10-CM

## 2015-05-31 DIAGNOSIS — J441 Chronic obstructive pulmonary disease with (acute) exacerbation: Secondary | ICD-10-CM | POA: Diagnosis not present

## 2015-05-31 DIAGNOSIS — J189 Pneumonia, unspecified organism: Secondary | ICD-10-CM | POA: Diagnosis not present

## 2015-05-31 LAB — BASIC METABOLIC PANEL
ANION GAP: 11 (ref 5–15)
BUN: 11 mg/dL (ref 6–20)
CALCIUM: 9.2 mg/dL (ref 8.9–10.3)
CO2: 24 mmol/L (ref 22–32)
Chloride: 105 mmol/L (ref 101–111)
Creatinine, Ser: 1.17 mg/dL — ABNORMAL HIGH (ref 0.44–1.00)
GFR calc Af Amer: 58 mL/min — ABNORMAL LOW (ref >60)
GFR, EST NON AFRICAN AMERICAN: 50 mL/min — AB (ref >60)
GLUCOSE: 135 mg/dL — AB (ref 65–99)
Potassium: 3.3 mmol/L — ABNORMAL LOW (ref 3.5–5.1)
Sodium: 140 mmol/L (ref 135–145)

## 2015-05-31 LAB — CBC
HCT: 30.6 % — ABNORMAL LOW (ref 36.0–46.0)
Hemoglobin: 10.2 g/dL — ABNORMAL LOW (ref 12.0–15.0)
MCH: 30.5 pg (ref 26.0–34.0)
MCHC: 33.3 g/dL (ref 30.0–36.0)
MCV: 91.6 fL (ref 78.0–100.0)
PLATELETS: 98 10*3/uL — AB (ref 150–400)
RBC: 3.34 MIL/uL — ABNORMAL LOW (ref 3.87–5.11)
RDW: 13.2 % (ref 11.5–15.5)
WBC: 2.5 10*3/uL — AB (ref 4.0–10.5)

## 2015-05-31 LAB — DIC (DISSEMINATED INTRAVASCULAR COAGULATION) PANEL
APTT: 34 s (ref 24–37)
D DIMER QUANT: 3.03 ug{FEU}/mL — AB (ref 0.00–0.50)
PLATELETS: 97 10*3/uL — AB (ref 150–400)
SMEAR REVIEW: NONE SEEN

## 2015-05-31 LAB — HIV ANTIBODY (ROUTINE TESTING W REFLEX): HIV Screen 4th Generation wRfx: NONREACTIVE

## 2015-05-31 LAB — DIC (DISSEMINATED INTRAVASCULAR COAGULATION)PANEL
Fibrinogen: 386 mg/dL (ref 204–475)
INR: 1.5 — ABNORMAL HIGH (ref 0.00–1.49)
Prothrombin Time: 18.2 seconds — ABNORMAL HIGH (ref 11.6–15.2)

## 2015-05-31 MED ORDER — DOXEPIN HCL 50 MG PO CAPS
50.0000 mg | ORAL_CAPSULE | Freq: Every day | ORAL | Status: DC
  Administered 2015-05-31 – 2015-06-01 (×2): 50 mg via ORAL
  Filled 2015-05-31 (×2): qty 1

## 2015-05-31 MED ORDER — TRAMADOL HCL 50 MG PO TABS
50.0000 mg | ORAL_TABLET | Freq: Four times a day (QID) | ORAL | Status: DC | PRN
Start: 2015-05-31 — End: 2015-06-02
  Administered 2015-05-31: 100 mg via ORAL
  Administered 2015-05-31: 50 mg via ORAL
  Administered 2015-06-01 (×2): 100 mg via ORAL
  Filled 2015-05-31 (×3): qty 2
  Filled 2015-05-31: qty 1

## 2015-05-31 MED ORDER — PANTOPRAZOLE SODIUM 40 MG PO TBEC
40.0000 mg | DELAYED_RELEASE_TABLET | Freq: Every day | ORAL | Status: DC
  Administered 2015-05-31 – 2015-06-02 (×3): 40 mg via ORAL
  Filled 2015-05-31 (×3): qty 1

## 2015-05-31 MED ORDER — TEMAZEPAM 15 MG PO CAPS: 30.0000 mg | ORAL_CAPSULE | Freq: Every day | ORAL | Status: DC

## 2015-05-31 MED ORDER — CITALOPRAM HYDROBROMIDE 20 MG PO TABS
20.0000 mg | ORAL_TABLET | Freq: Every day | ORAL | Status: DC
  Administered 2015-05-31 – 2015-06-01 (×2): 20 mg via ORAL
  Filled 2015-05-31 (×2): qty 1

## 2015-05-31 MED ORDER — ALBUTEROL SULFATE HFA 108 (90 BASE) MCG/ACT IN AERS: 2.0000 | INHALATION_SPRAY | Freq: Four times a day (QID) | RESPIRATORY_TRACT | Status: DC | PRN

## 2015-05-31 MED ORDER — IBUPROFEN 600 MG PO TABS: 600.0000 mg | ORAL_TABLET | Freq: Four times a day (QID) | ORAL | Status: DC | PRN

## 2015-05-31 MED ORDER — POTASSIUM CHLORIDE CRYS ER 20 MEQ PO TBCR
40.0000 meq | EXTENDED_RELEASE_TABLET | Freq: Once | ORAL | Status: AC
  Administered 2015-05-31: 40 meq via ORAL
  Filled 2015-05-31: qty 2

## 2015-05-31 MED ORDER — TRAMADOL HCL 50 MG PO TABS
100.0000 mg | ORAL_TABLET | Freq: Once | ORAL | Status: AC
  Administered 2015-06-01: 100 mg via ORAL
  Filled 2015-05-31: qty 2

## 2015-05-31 MED ORDER — ALBUTEROL SULFATE (2.5 MG/3ML) 0.083% IN NEBU: 2.5000 mg | INHALATION_SOLUTION | Freq: Four times a day (QID) | RESPIRATORY_TRACT | Status: DC | PRN

## 2015-05-31 MED ORDER — CITALOPRAM HYDROBROMIDE 20 MG PO TABS: 20.0000 mg | ORAL_TABLET | Freq: Every day | ORAL | Status: DC

## 2015-05-31 MED ORDER — OXYCODONE HCL 5 MG PO TABS
5.0000 mg | ORAL_TABLET | ORAL | Status: DC | PRN
  Administered 2015-05-31 (×2): 5 mg via ORAL
  Filled 2015-05-31 (×2): qty 1

## 2015-05-31 MED ORDER — ALPRAZOLAM 0.5 MG PO TABS
0.5000 mg | ORAL_TABLET | Freq: Every day | ORAL | Status: DC
  Administered 2015-05-31 – 2015-06-01 (×2): 0.5 mg via ORAL
  Filled 2015-05-31 (×2): qty 1

## 2015-05-31 MED ORDER — MOMETASONE FURO-FORMOTEROL FUM 100-5 MCG/ACT IN AERO
2.0000 | INHALATION_SPRAY | Freq: Two times a day (BID) | RESPIRATORY_TRACT | Status: DC
  Administered 2015-05-31 – 2015-06-01 (×4): 2 via RESPIRATORY_TRACT
  Filled 2015-05-31 (×3): qty 8.8

## 2015-05-31 MED ORDER — DEXTROSE 5 % IV SOLN
1.0000 g | INTRAVENOUS | Status: DC
  Administered 2015-05-31: 1 g via INTRAVENOUS
  Filled 2015-05-31: qty 10

## 2015-05-31 MED ORDER — RIVAROXABAN 20 MG PO TABS: 20.0000 mg | ORAL_TABLET | Freq: Every day | ORAL | Status: DC

## 2015-05-31 MED ORDER — ILOPERIDONE 4 MG PO TABS
12.0000 mg | ORAL_TABLET | Freq: Every morning | ORAL | Status: DC
  Filled 2015-05-31: qty 3

## 2015-05-31 MED ORDER — ENTECAVIR 0.5 MG PO TABS: 0.5000 mg | ORAL_TABLET | ORAL | Status: DC

## 2015-05-31 MED ORDER — SODIUM CHLORIDE 0.9 % IV SOLN
1.5000 g | Freq: Four times a day (QID) | INTRAVENOUS | Status: DC
  Administered 2015-05-31 – 2015-06-01 (×4): 1.5 g via INTRAVENOUS
  Filled 2015-05-31 (×7): qty 1.5

## 2015-05-31 MED ORDER — SODIUM CHLORIDE 0.9 % IV SOLN: INTRAVENOUS | Status: DC

## 2015-05-31 MED ORDER — ILOPERIDONE 4 MG PO TABS: 12.0000 mg | ORAL_TABLET | Freq: Every day | ORAL | Status: DC

## 2015-05-31 MED ORDER — ALPRAZOLAM 0.5 MG PO TABS
1.0000 mg | ORAL_TABLET | Freq: Three times a day (TID) | ORAL | Status: DC | PRN
  Administered 2015-05-31: 1 mg via ORAL
  Filled 2015-05-31: qty 2

## 2015-05-31 MED ORDER — AZITHROMYCIN 500 MG PO TABS
500.0000 mg | ORAL_TABLET | ORAL | Status: DC
  Administered 2015-05-31: 500 mg via ORAL
  Filled 2015-05-31: qty 1

## 2015-05-31 NOTE — Progress Notes (Signed)
Admission note:  Arrival Method: Pt arrived on stretcher from ED Mental Orientation: Alert and oriented x 4 Telemetry: Telemetry box 6e04 applied. CCMT notified.  Assessment: Completed, see flowsheets Skin: Dry and intact. Skin assessed with Sima Matas, RN IV: Left wrist IV. Site clean, dry and intact.  Pain: Pt states pain is 7/10. Pain medication administered, see MAR.  Tubes: N/A Safety Measures: Bed in lowest position, non-slip socks placed, call light within reach Fall Prevention Safety Plan: Reviewed with pt  Admission Screening: Completed  6700 Orientation: Patient has been oriented to the unit, staff and to the room. Orders have been reviewed and implemented. Call light within reach, will continue to monitor the patient closely.   Shelbie Hutching, RN, BSN

## 2015-05-31 NOTE — Progress Notes (Signed)
Report received from ED RN. Room is ready for patient.   Shelbie Hutching, RN, BSN

## 2015-05-31 NOTE — Progress Notes (Signed)
Pharmacy Antibiotic Note  [name] is a 60 y.o. female admitted on 05/30/2015 with aspiration pneumonia.  Pharmacy has been consulted for Unasyn dosing. Renal function stable.  According to medical record suspect PNA in the setting of pulmonary infarction d/t recent PE. Afebrile, mildly leukopenic, not septic, lactate borderline top normal due to pulmonary infarction. Appears nontoxic  Plan: Start Unasyn 1.5 g IV q 6 hours Monitor clinical progress, c/s, renal function, abx plan/LOT   Height: 5\' 4"  (162.6 cm) Weight: 232 lb 1.6 oz (105.28 kg) IBW/kg (Calculated) : 54.7  Temp (24hrs), Avg:97.8 F (36.6 C), Min:97.4 F (36.3 C), Max:98.2 F (36.8 C)   Recent Labs Lab 05/25/15 1944 05/25/15 2031 05/25/15 2316 05/26/15 0219 05/28/15 2030 05/30/15 2143 05/30/15 2316 05/31/15 0220  WBC 6.7  --   --  5.4 2.1* 1.8*  --  2.5*  CREATININE 1.28*  --   --  1.28* 1.15* 1.20*  --  1.17*  LATICACIDVEN  --  0.67 0.66  --   --   --  2.22*  --     Estimated Creatinine Clearance: 62 mL/min (by C-G formula based on Cr of 1.17).    Allergies  Allergen Reactions  . Heparin Other (See Comments)    HIT ab positive with elevated OD  . Amitriptyline Hcl     Other reaction(s): Other Causes her to be very "disoriented".  . Gabapentin Other (See Comments) and Rash    REACTION: rash in mouth Itching; rash  REACTION: rash in mouth Other reaction(s): RASH REACTION: rash in mouth  . Cymbalta [Duloxetine Hcl] Other (See Comments)    headaches  . Elavil [Amitriptyline] Other (See Comments)    Causes her to be very "disoriented".  . Neomycin-Bacitracin Zn-Polymyx Rash    Antimicrobials this admission: 5/8 Unasyn >>  Got dose x 1 of Rocephin, Cefepime, vanc, and azithromycin (5/7-5/8)  Dose adjustments this admission: N/a  Microbiology results: 5/7 BC: x 2 sent   Thank you for allowing [place] to participate in this patients care. Jens Som, PharmD Pager: [phone]  05/31/2015 11:24  AM

## 2015-05-31 NOTE — Progress Notes (Signed)
Family members arrived earlier around an hour ago.No home med brought from home with them as per requested by nurse this morning to the patient.

## 2015-05-31 NOTE — Care Management Note (Addendum)
Case Management Note  Patient Details  Name: [name] MRN: [id number] Date of Birth: 1955/02/29  Subjective/Objective:      SOB, chest pain              Action/Plan: Discharge Planning:  NCM spoke to pt and states she uses [facility], on [address] # [phone], spoke to pharmacist. Alveda Reasons is requiring a prior [place], # [phone]. Will fax form to unit. Pt states she does not have a neb machine at home. NCM will follow up with attending for prior auth and orders for neb machine.   PCP- Luetta Nutting MD  [phone] NCM spoke to pt and states she wants to schedule appt with her PCP, Dr Zigmund Daniel. Noah Charon on speaker phone while NCM talking to pt. Explained to dtr, [place] Pt has missed 7 appts at Dr. Zigmund Daniel office and office has counseled her several times. Office is willing to see pt if pt makes appt. Dtr states she will make sure pt keeps appt. She will call to schedule appt and give NCM a call back. Explained Xarelto 30 day free trial coupon [facility] # [phone]. Dtr states when she went to pick up medication the pharmacy states prior auth was needed and they closed before it was completed. Explained to pt that Dr Zigmund Daniel will follow up on prior auth once she has been seen in her office to do long term management of medications and refills.   68 Dtr states she made appt with Dr West Pugh office for Jun 07, 2015 at 1:15 pm. States he son has an appt that day and she will ensure she goes to appt. Information entered on dc instructions follow up.    [place] is ready for pick up.    Expected Discharge Date:  06/01/2015               Expected Discharge Plan:  Home/Self Care  In-House Referral:  NA  Discharge planning Services  CM Consult  Post Acute Care Choice:  NA Choice offered to:  NA  DME Arranged:  Nebulizer machine DME Agency:  [place]:  NA [place] Agency:  NA  Status of Service:  Completed,  signed off  Medicare Important Message Given:    Date Medicare IM Given:    Medicare IM give by:    Date Additional Medicare IM Given:    Additional Medicare Important Message give by:     If discussed at [place] of Stay Meetings, dates discussed:    Additional Comments:  Erenest Rasher, RN 05/31/2015, 10:25 AM

## 2015-05-31 NOTE — Hospital Discharge Follow-Up (Signed)
Call received from Jonnie Finner, RN CM requesting the patient's appointment at [facility] on 06/02/15 be cancelled as the patient already has an appointment scheduled with her PCP.

## 2015-05-31 NOTE — H&P (Signed)
History and Physical    [name] [id number] DOB: 1955/09/06 DOA: 05/30/2015  Referring MD/NP/PA: Dr. Verlene Mayer PCP: Marijean Bravo, MD Outpatient Specialists: None Patient coming from: Home  Chief Complaint: SOB, chest pain  HPI: [name] is a 60 y.o. female with medical history significant of cirrhosis, recent PE, admitted to our service from 5/2 to 5/3.  Discharged on Xarelto but she wasn't able to get this filled yet due to insurance issues.  She returns to the ED with worsening SOB and chest pain.  Patient reports fever at home yesterday of up to 101.  Has worsening cough, wheezing.  ED Course: Leukopenia with WBC 1.8k, this is new.  She also has chronic thrombocytopenia that is likely secondary to her known cirrhosis of the liver.  Initially tachycardic to 110s this improves with 1L IVF bolus.  Review of Systems: As per HPI otherwise 10 point review of systems negative.    Past Medical History  Diagnosis Date  . COPD (chronic obstructive pulmonary disease) ([place])   . Cirrhosis ([place])   . Pulmonary emboli ([place]) 09/2013  . Kidney stones   . Heart murmur   . DVT (deep venous thrombosis) ([place]) 09/2013    RLE  . Pneumonia     "4 times in the past year" (10/30/2013)  . History of blood transfusion     "due to excessive blood loss before hysterectomy"  . GERD (gastroesophageal reflux disease)   . Hepatitis B   . Arthritis     "knees; lower back" (10/30/2013)  . Anxiety   . Depression   . Bipolar disorder ([place])   . Hypertension     currently on no medication   . Shortness of breath dyspnea     increased exertion;exercise  . Agoraphobia with panic attacks   . History of urinary tract infection   . Urinary incontinence   . Urinary frequency   . Altered mental state 10/26/2013  . Ankle fracture, left 11/27/2010    S/p ORIF 11/2   . Cavitary pneumonia   . CKD (chronic kidney disease), stage III     Past Surgical History  Procedure Laterality Date  . Carpal tunnel  release Left   . Fracture surgery    . Ankle fracture surgery Left   . Abdominal hysterectomy    . Tubal ligation    . Dilation and curettage of uterus  X 2  . Hand surgery      1980's/left hand   . Laparoscopic assisted ventral hernia repair N/A 12/10/2014    Procedure: LAPAROSCOPIC ASSISTED REPAIR OF INCARCERATED UMBILICAL HERNIA ;  Surgeon: Michael Boston, MD;  Location: [facility];  Service: General;  Laterality: N/A;  . Insertion of mesh N/A 12/10/2014    Procedure: INSERTION OF MESH;  Surgeon: Michael Boston, MD;  Location: [facility];  Service: General;  Laterality: N/A;     reports that she quit smoking about 19 months ago. Her smoking use included Cigarettes. She has a 35 pack-year smoking history. She has never used smokeless tobacco. She reports that she does not drink alcohol or use illicit drugs.  Allergies  Allergen Reactions  . Heparin Other (See Comments)    HIT ab positive with elevated OD  . Amitriptyline Hcl     Other reaction(s): Other Causes her to be very "disoriented".  . Gabapentin Other (See Comments) and Rash    REACTION: rash in mouth Itching; rash  REACTION: rash in mouth Other reaction(s): RASH REACTION: rash in mouth  .  Cymbalta [Duloxetine Hcl] Other (See Comments)    headaches  . Elavil [Amitriptyline] Other (See Comments)    Causes her to be very "disoriented".  . Neomycin-Bacitracin Zn-Polymyx Rash    Family History  Problem Relation Age of Onset  . Stroke Mother   . Lung cancer Father      Prior to Admission medications   Medication Sig Start Date End Date Taking? Authorizing Provider  ALPRAZolam Duanne Moron) 1 MG tablet Take 1 mg by mouth 3 (three) times daily as needed for anxiety.   Yes Historical Provider, MD  budesonide-formoterol (SYMBICORT) 80-4.5 MCG/ACT inhaler Inhale 2 puffs into the lungs 2 (two) times daily. 05/27/15  Yes Mir Marry Guan, MD  ciprofloxacin (CIPRO) 250 MG tablet Take 1 tablet (250 mg total) by mouth 2 (two) times  daily. 05/27/15  Yes Mir Marry Guan, MD  citalopram (CELEXA) 20 MG tablet Take 20 mg by mouth at bedtime.   Yes Historical Provider, MD  doxepin (SINEQUAN) 25 MG capsule Take 2 capsules by mouth at bedtime. 05/27/15  Yes Historical Provider, MD  entecavir (BARACLUDE) 0.5 MG tablet Take 0.5 mg by mouth every other day.    Yes Historical Provider, MD  ibuprofen (ADVIL,MOTRIN) 200 MG tablet Take 600 mg by mouth every 6 (six) hours as needed for headache or mild pain.   Yes Historical Provider, MD  Iloperidone (FANAPT) 6 MG TABS Take 12 mg by mouth every morning.    Yes Historical Provider, MD  oxyCODONE (OXY IR/ROXICODONE) 5 MG immediate release tablet Take 1 tablet (5 mg total) by mouth every 4 (four) hours as needed for moderate pain or breakthrough pain. 05/27/15  Yes Mir Marry Guan, MD  pantoprazole (PROTONIX) 40 MG tablet Take 40 mg by mouth daily. 12/23/13  Yes Historical Provider, MD  PROAIR HFA 108 (90 Base) MCG/ACT inhaler INHALE 2 PUFFS INTO THE LUNGS EVERY 6 HOURS AS NEEDED FOR WHEEZING OR SHORTNESS OF BREATH. 02/01/15  Yes Rigoberto Noel, MD  Rivaroxaban 15 & 20 MG TBPK Take as directed on package: Start with one 15mg  tablet by mouth twice a day with food. On Day 22, switch to one 20mg  tablet once a day with food. 05/28/15  Yes Tammy Sours, MD  spironolactone (ALDACTONE) 50 MG tablet Take 100 mg by mouth every morning. 03/10/15  Yes Historical Provider, MD  temazepam (RESTORIL) 30 MG capsule Take 30 mg by mouth at bedtime. 09/14/14  Yes Historical Provider, MD  traMADol (ULTRAM) 50 MG tablet Take 1-2 tablets (50-100 mg total) by mouth every 6 (six) hours as needed for moderate pain or severe pain. 05/27/15  Yes Mir Marry Guan, MD  Spacer/Aero-Holding Chambers (AEROCHAMBER MV) inhaler Use as instructed 10/08/14   Rigoberto Noel, MD    Physical Exam: Filed Vitals:   05/30/15 2121 05/30/15 2123 05/30/15 2145  BP: 112/56  103/68  Pulse: 91  98  Temp: 97.7 F (36.5 C)    TempSrc:  Oral    Resp: 37  17  Height:  5\' 4"  (1.626 m)   Weight:  104.327 kg (230 lb)   SpO2: 100%  93%      Constitutional: NAD, calm, comfortable Filed Vitals:   05/30/15 2121 05/30/15 2123 05/30/15 2145  BP: 112/56  103/68  Pulse: 91  98  Temp: 97.7 F (36.5 C)    TempSrc: Oral    Resp: 37  17  Height:  5\' 4"  (1.626 m)   Weight:  104.327 kg (230 lb)   SpO2: 100%  93%   Eyes: PERRL, lids and conjunctivae normal ENMT: Mucous membranes are moist. Posterior pharynx clear of any exudate or lesions.Normal dentition.  Neck: normal, supple, no masses, no thyromegaly Respiratory: clear to auscultation bilaterally, no wheezing, no crackles. Normal respiratory effort. No accessory muscle use.  Cardiovascular: Regular rate and rhythm, no murmurs / rubs / gallops. No extremity edema. 2+ pedal pulses. No carotid bruits.  Abdomen: no tenderness, no masses palpated. No hepatosplenomegaly. Bowel sounds positive.  Musculoskeletal: no clubbing / cyanosis. No joint deformity upper and lower extremities. Good ROM, no contractures. Normal muscle tone.  Skin: no rashes, lesions, ulcers. No induration Neurologic: CN 2-12 grossly intact. Sensation intact, DTR normal. Strength 5/5 in all 4.  Psychiatric: Normal judgment and insight. Alert and oriented x 3. Normal mood.    Labs on Admission: I have personally reviewed following labs and imaging studies  CBC:  Recent Labs Lab 05/25/15 1944 05/26/15 0219 05/28/15 2030 05/30/15 2143  WBC 6.7 5.4 2.1* 1.8*  NEUTROABS 4.4  --   --   --   HGB 14.3 12.0 10.8* 10.5*  HCT 42.3 36.4 32.5* 32.2*  MCV 91.4 89.2 91.8 89.9  PLT 109* 103* 92* 85*   Basic Metabolic Panel:  Recent Labs Lab 05/25/15 1944 05/26/15 0219 05/28/15 2030 05/30/15 2143  NA 139 136 139 142  K 4.9 3.6 3.2* 3.4*  CL 101 103 105 108  CO2 26 24 23 22   GLUCOSE 100* 144* 107* 154*  BUN 14 13 9 12   CREATININE 1.28* 1.28* 1.15* 1.20*  CALCIUM 9.7 9.0 8.9 9.4   GFR: Estimated  Creatinine Clearance: 60.1 mL/min (by C-G formula based on Cr of 1.2). Liver Function Tests:  Recent Labs Lab 05/25/15 1944  AST 28  ALT 17  ALKPHOS 82  BILITOT 1.8*  PROT 7.1  ALBUMIN 4.1   No results for input(s): LIPASE, AMYLASE in the last 168 hours. No results for input(s): AMMONIA in the last 168 hours. Coagulation Profile:  Recent Labs Lab 05/26/15 0219 05/28/15 2030  INR 1.37 1.42   Cardiac Enzymes:  Recent Labs Lab 05/26/15 0219 05/28/15 2030  TROPONINI <0.03 <0.03   BNP (last 3 results) No results for input(s): PROBNP in the last 8760 hours. HbA1C: No results for input(s): HGBA1C in the last 72 hours. CBG:  Recent Labs Lab 05/26/15 1225 05/27/15 0608  GLUCAP 131* 124*   Lipid Profile: No results for input(s): CHOL, HDL, LDLCALC, TRIG, CHOLHDL, LDLDIRECT in the last 72 hours. Thyroid Function Tests: No results for input(s): TSH, T4TOTAL, FREET4, T3FREE, THYROIDAB in the last 72 hours. Anemia Panel: No results for input(s): VITAMINB12, FOLATE, FERRITIN, TIBC, IRON, RETICCTPCT in the last 72 hours. Urine analysis:    Component Value Date/Time   COLORURINE YELLOW 05/28/2015 2030   [place] 05/28/2015 2030   LABSPEC 1.011 05/28/2015 2030   Page 6.0 05/28/2015 2030   GLUCOSEU NEGATIVE 05/28/2015 2030   [place] NEGATIVE 05/28/2015 2030   [place] NEGATIVE 05/28/2015 2030   [place] 05/28/2015 2030   PROTEINUR NEGATIVE 05/28/2015 2030   UROBILINOGEN 1.0 04/03/2014 1645   NITRITE NEGATIVE 05/28/2015 2030   LEUKOCYTESUR NEGATIVE 05/28/2015 2030   Sepsis Labs: @LABRCNTIP (procalcitonin:4,lacticidven:4) ) Recent Results (from the past 240 hour(s))  Urine culture     Status: Abnormal   Collection Time: 05/25/15  8:21 PM  Result Value Ref Range Status   Specimen Description URINE, CLEAN CATCH  Final   Special Requests Normal  Final   Culture MULTIPLE SPECIES PRESENT, SUGGEST  RECOLLECTION (A)  Final   Report Status  05/27/2015 FINAL  Final  MRSA PCR Screening     Status: None   Collection Time: 05/26/15  2:00 AM  Result Value Ref Range Status   MRSA by PCR NEGATIVE NEGATIVE Final    Comment:        The GeneXpert MRSA Assay (FDA approved for NASAL specimens only), is one component of a comprehensive MRSA colonization surveillance program. It is not intended to diagnose MRSA infection nor to guide or monitor treatment for MRSA infections.      Radiological Exams on Admission: Dg Chest 2 View  05/30/2015  CLINICAL DATA:  60 year old female with shortness of breath and chest pain EXAM: CHEST  2 VIEW COMPARISON:  Chest radiograph dated 05/28/2015 FINDINGS: Two views of the chest demonstrate stable appearing small bilateral pleural effusions with bibasilar platelike atelectatic changes. There is no focal consolidation or pneumothorax. Stable cardiac silhouette. No acute osseous pathology. IMPRESSION: Small bilateral pleural effusions with no significant interval change. Electronically Signed   By: Anner Crete M.D.   On: 05/30/2015 22:09   Ct Angio Chest Pe W/cm &/or Wo Cm  05/31/2015  CLINICAL DATA:  60 year old female with chest pain and shortness of breath. EXAM: CT ANGIOGRAPHY CHEST WITH CONTRAST TECHNIQUE: Multidetector CT imaging of the chest was performed using the standard protocol during bolus administration of intravenous contrast. Multiplanar CT image reconstructions and MIPs were obtained to evaluate the vascular anatomy. CONTRAST:  80 cc Isovue 370 COMPARISON:  Chest radiograph dated 05/30/2015 and chest CT dated 05/25/2015 FINDINGS: There small bilateral pleural effusions grossly similar to prior study. Bibasilar linear atelectasis/ scarring noted. There are small subpleural areas of ground-glass and nodular density at the right lung base, likely representing areas of pulmonary infarct. An infectious process is not excluded. There are small patchy areas of ground-glass and nodular density in the  right upper lobe which may represent areas of infarct or atypical pneumonia. Correlation with clinical exam and sputum cultures recommended. There is a 9 mm nodular density in the right upper lobe which appears new compared the prior study. A stable area of linear scarring noted extending from the right hilum into the right apex. The central airways are patent. The thoracic aorta appears unremarkable. The visualized portions of the origins of the great vessels are patent. There has been interval resolution of the previously seen pulmonary embolism. Evaluation of the pulmonary arteries is somewhat limited due to suboptimal opacification of the distal branches. No CT evidence of central pulmonary artery embolus identified. There is enlargement of the main pulmonary trunk suggestive of a degree of pulmonary hypertension. A a there is no cardiomegaly or pericardial effusion. Top-normal right hilar lymph node similar to prior study. There is no mediastinal adenopathy. The esophagus and the thyroid gland appear grossly unremarkable. There is no axillary adenopathy. The chest wall soft tissues appear unremarkable. There is mild degenerative changes of the spine. No acute fracture. Cirrhosis of the liver.  Splenomegaly. Review of the MIP images confirms the above findings. IMPRESSION: No CT evidence of pulmonary embolism. Interval resolution of the previously seen pulmonary embolism. Small patchy areas of ground-glass density at the right lung base peripherally as well as in the right upper lobe may represent areas of pulmonary infarct since versus less likely infectious process. A 9 mm right upper lobe pulmonary nodule, new from prior study, and likely inflammatory/infectious in etiology. Small bilateral pleural effusions. Electronically Signed   By: Laren Everts.D.  On: 05/31/2015 00:05    EKG: Independently reviewed.  Assessment/Plan Principal Problem:   PE (pulmonary embolism) Active Problems:   Hepatic  cirrhosis (HCC)   COPD (chronic obstructive pulmonary disease) (HCC)   Right lower lobe pneumonia   Right upper lobe pneumonia   CAP (community acquired pneumonia)   Thrombocytopenia (H[facility])   SOB due to either pneumonia vs pulmonary infarction as described - will treat as PNA given history of fever, presence of leukopenia which is also new.  Putting patient on rocephin and azithromycin  PNA pathway  Cultures pending  Gentle "hydration" by holding her spironolactone, got 1L IVF in ED  PE - continue Xeralto  Thrombocytopenia - chronic, likely secondary to known cirrhosis of the liver  Cirrhosis of liver - in setting of HBV and HCV  COPD - at baseline sats 90% on room air at rest per history   DVT prophylaxis: Xarelto Code Status: Full Family Communication: No family in room Consults called: None Admission status: Admit to obs   Malayna Noori, [place] [facility] Pager [phone] from 7PM-7AM  If 7AM-7PM, please contact the day physician for the patient [email or web address] Password [email or web address]  05/31/2015, 12:25 AM

## 2015-05-31 NOTE — Care Management Obs Status (Signed)
[place] NOTIFICATION   Patient Details  Name: [name] MRN: [id number] Date of Birth: [birth year]   Medicare Observation Status Notification Given:  Yes    Erenest Rasher, RN 05/31/2015, 12:34 PM

## 2015-05-31 NOTE — Progress Notes (Signed)
PROGRESS NOTE                                                                                                                                                                                                             Patient Demographics:    [name], is a 60 y.o. female, DOB - 1955/11/27, [id number]  Admit date - 05/30/2015   Admitting Physician Etta Quill, DO  Outpatient Primary MD for the patient is TALBOT, DAVID Loletha Grayer, MD  LOS -   Outpatient Specialists:   Chief Complaint  Patient presents with  . Shortness of Breath  . Chest Pain  . Flank Pain       Brief Narrative   [name] is a 60 y.o. female with medical history significant of cirrhosis, recent PE, admitted to our service from 5/2 to 5/3. Discharged on Xarelto but she wasn't able to get this filled yet due to insurance issues. She returns to the ED with worsening SOB and chest pain. Patient reports fever at home yesterday of up to 101. Has worsening cough, wheezing.  ED Course: Leukopenia with WBC 1.8k, this is new. She also has chronic thrombocytopenia that is likely secondary to her known cirrhosis of the liver. Initially tachycardic to 110s this improves with 1L IVF bolus.    Subjective:    [name] today has, No headache, No chest pain, No abdominal pain - No Nausea, No new weakness tingling or numbness, No Cough - SOB.    Assessment  & Plan :     1.Pneumonia in the setting of pulmonary infarction due to recent PE. Afebrile, mildly leukopenic, not septic, lactate borderline top normal due to pulmonary infarction. Appears nontoxic, place on Unasyn, follow cultures. Supportive care with oxygen and nebulizer treatments. Increase activity. Denies any history suggestive of aspiration.  2. Recent left lower extremity DVT, PE. Replaced on xaralto, patient noncompliant with it counseled, she apparently has full coverage for xaralto and her  prescription was ready at the pharmacy which she never picked after last discharge.  3. 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 Outpatient follow-up with PCP.  4. Pancytopenia due to #3 above. Monitor clinically.  5. Underlying COPD. No wheezing, no exacerbation. Supportive care.  6. Morbid obesity. Follow with PCP for weight loss.  7. Excessive narcotic use  with somnolence. Reduce benzodiazepines and narcotics, patient counseled.  8. Hypokalemia. Replaced.  9. GERD. On PPI.  10. Depression. Not suicidal or homicidal, continue Celexa.    Code Status : Full  Family Communication  : None present  Disposition Plan  :  discharge home in 1-2 days  Barriers For Discharge : Treatment of pneumonia  Consults  :  None  Procedures  : CTA chest - Pulmonary infarction  DVT Prophylaxis  :  Xaralto  Lab Results  Component Value Date   PLT 98* 05/31/2015    Antibiotics  :     Anti-infectives    Start     Dose/Rate Route Frequency Ordered Stop   05/31/15 1000  entecavir (BARACLUDE) tablet 0.5 mg     0.5 mg Oral Every other day 05/31/15 0014     05/31/15 0800  cefTRIAXone (ROCEPHIN) 1 g in dextrose 5 % 50 mL IVPB  Status:  Discontinued     1 g 100 mL/hr over 30 Minutes Intravenous Every 24 hours 05/31/15 0025 05/31/15 1042   05/31/15 0800  azithromycin (ZITHROMAX) tablet 500 mg  Status:  Discontinued     500 mg Oral Every 24 hours 05/31/15 0025 05/31/15 1042   05/30/15 2300  ceFEPIme (MAXIPIME) 2 g in dextrose 5 % 50 mL IVPB     2 g 100 mL/hr over 30 Minutes Intravenous  Once 05/30/15 2247 05/31/15 0008   05/30/15 2300  vancomycin (VANCOCIN) IVPB 1000 mg/200 mL premix     1,000 mg 200 mL/hr over 60 Minutes Intravenous  Once 05/30/15 2247 05/31/15 0108        Objective:   Filed Vitals:   05/31/15 0100 05/31/15 0139 05/31/15 0610 05/31/15 0900  BP: 127/74 125/71 124/74 105/62  Pulse: 78 72 70 68  Temp:  97.9 F (36.6 C) 98.2 F (36.8 C) 97.4 F  (36.3 C)  TempSrc:  Oral Oral Oral  Resp: 20 18 19    Height:  5\' 4"  (1.626 m)    Weight:  105.28 kg (232 lb 1.6 oz)    SpO2: 94% 99% 100% 98%    Wt Readings from Last 3 Encounters:  05/31/15 105.28 kg (232 lb 1.6 oz)  05/28/15 108.863 kg (240 lb)  05/27/15 107.548 kg (237 lb 1.6 oz)     Intake/Output Summary (Last 24 hours) at 05/31/15 1043 Last data filed at 05/31/15 [id number]  Gross per 24 hour  Intake    240 ml  Output      0 ml  Net    240 ml     Physical Exam  Mildly somnolent, Alert, Oriented X 3, No new F.N deficits, Normal affect [place].AT,PERRAL Supple Neck,No JVD, No cervical lymphadenopathy appriciated.  Symmetrical Chest wall movement, Good air movement bilaterally, CTAB RRR,No Gallops,Rubs or new Murmurs, No Parasternal Heave +ve B.Sounds, Abd Soft, No tenderness, No organomegaly appriciated, No rebound - guarding or rigidity. No Cyanosis, Clubbing or edema, No new Rash or bruise      Data Review:    CBC  Recent Labs Lab 05/25/15 1944 05/26/15 0219 05/28/15 2030 05/30/15 2143 05/31/15 0026 05/31/15 0220  WBC 6.7 5.4 2.1* 1.8*  --  2.5*  HGB 14.3 12.0 10.8* 10.5*  --  10.2*  HCT 42.3 36.4 32.5* 32.2*  --  30.6*  PLT 109* 103* 92* 85* 97* 98*  MCV 91.4 89.2 91.8 89.9  --  91.6  MCH 30.9 29.4 30.5 29.3  --  30.5  MCHC 33.8 33.0 33.2 32.6  --  33.3  RDW 13.0 12.8 12.9 13.0  --  13.2  LYMPHSABS 1.8  --   --   --   --   --   MONOABS 0.5  --   --   --   --   --   EOSABS 0.1  --   --   --   --   --   BASOSABS 0.0  --   --   --   --   --     Chemistries   Recent Labs Lab 05/25/15 1944 05/26/15 0219 05/28/15 2030 05/30/15 2143 05/31/15 0220  NA 139 136 139 142 140  K 4.9 3.6 3.2* 3.4* 3.3*  CL 101 103 105 108 105  CO2 26 24 23 22 24   GLUCOSE 100* 144* 107* 154* 135*  BUN 14 13 9 12 11   CREATININE 1.28* 1.28* 1.15* 1.20* 1.17*  CALCIUM 9.7 9.0 8.9 9.4 9.2  AST 28  --   --   --   --   ALT 17  --   --   --   --   ALKPHOS 82  --   --   --   --     BILITOT 1.8*  --   --   --   --    ------------------------------------------------------------------------------------------------------------------ No results for input(s): CHOL, HDL, LDLCALC, TRIG, CHOLHDL, LDLDIRECT in the last 72 hours.  Lab Results  Component Value Date   HGBA1C 5.0 12/09/2013   ------------------------------------------------------------------------------------------------------------------ No results for input(s): TSH, T4TOTAL, T3FREE, THYROIDAB in the last 72 hours.  Invalid input(s): FREET3 ------------------------------------------------------------------------------------------------------------------ No results for input(s): VITAMINB12, FOLATE, FERRITIN, TIBC, IRON, RETICCTPCT in the last 72 hours.  Coagulation profile  Recent Labs Lab 05/26/15 0219 05/28/15 2030 05/31/15 0026  INR 1.37 1.42 1.50*     Recent Labs  05/31/15 0026  DDIMER 3.03*    Cardiac Enzymes  Recent Labs Lab 05/26/15 0219 05/28/15 2030  TROPONINI <0.03 <0.03   ------------------------------------------------------------------------------------------------------------------    Component Value Date/Time   BNP 74.6 05/28/2015 2047    Inpatient Medications  Scheduled Meds: . ALPRAZolam  0.5 mg Oral QHS  . citalopram  20 mg Oral QHS  . doxepin  50 mg Oral QHS  . entecavir  0.5 mg Oral QODAY  . iloperidone  12 mg Oral Daily  . mometasone-formoterol  2 puff Inhalation BID  . pantoprazole  40 mg Oral Daily  . Rivaroxaban  15 mg Oral BID WC  . [START ON 06/16/2015] rivaroxaban  20 mg Oral Q supper   Continuous Infusions:  PRN Meds:.albuterol, ibuprofen, traMADol  Micro Results Recent Results (from the past 240 hour(s))  Urine culture     Status: Abnormal   Collection Time: 05/25/15  8:21 PM  Result Value Ref Range Status   Specimen Description URINE, CLEAN CATCH  Final   Special Requests Normal  Final   Culture MULTIPLE SPECIES PRESENT, SUGGEST RECOLLECTION  (A)  Final   Report Status 05/27/2015 FINAL  Final  MRSA PCR Screening     Status: None   Collection Time: 05/26/15  2:00 AM  Result Value Ref Range Status   MRSA by PCR NEGATIVE NEGATIVE Final    Comment:        The GeneXpert MRSA Assay (FDA approved for NASAL specimens only), is one component of a comprehensive MRSA colonization surveillance program. It is not intended to diagnose MRSA infection nor to guide or monitor treatment for MRSA infections.     Radiology Reports Dg Chest 2 View  05/30/2015  CLINICAL DATA:  60 year old female with shortness of breath and chest pain EXAM: CHEST  2 VIEW COMPARISON:  Chest radiograph dated 05/28/2015 FINDINGS: Two views of the chest demonstrate stable appearing small bilateral pleural effusions with bibasilar platelike atelectatic changes. There is no focal consolidation or pneumothorax. Stable cardiac silhouette. No acute osseous pathology. IMPRESSION: Small bilateral pleural effusions with no significant interval change. Electronically Signed   By: Anner Crete M.D.   On: 05/30/2015 22:09   Dg Chest 2 View  05/28/2015  CLINICAL DATA:  60 year old female with shortness of breath and chest pain for 4 days with recent diagnosis of pulmonary emboli. EXAM: CHEST  2 VIEW COMPARISON:  05/25/2015 CT.  12/04/2014 and prior chest radiographs. FINDINGS: The cardiomediastinal silhouette is unchanged with mild cardiomegaly. Small bilateral pleural effusions are identified with mild bibasilar atelectasis. There is no evidence of pulmonary edema, pneumothorax or pulmonary mass. No acute bony abnormalities are noted. IMPRESSION: Small bilateral pleural effusions and mild bibasilar atelectasis. Electronically Signed   By: Margarette Canada M.D.   On: 05/28/2015 20:18   Ct Angio Chest Pe W/cm &/or Wo Cm  05/31/2015  CLINICAL DATA:  60 year old female with chest pain and shortness of breath. EXAM: CT ANGIOGRAPHY CHEST WITH CONTRAST TECHNIQUE: Multidetector CT imaging of  the chest was performed using the standard protocol during bolus administration of intravenous contrast. Multiplanar CT image reconstructions and MIPs were obtained to evaluate the vascular anatomy. CONTRAST:  80 cc Isovue 370 COMPARISON:  Chest radiograph dated 05/30/2015 and chest CT dated 05/25/2015 FINDINGS: There small bilateral pleural effusions grossly similar to prior study. Bibasilar linear atelectasis/ scarring noted. There are small subpleural areas of ground-glass and nodular density at the right lung base, likely representing areas of pulmonary infarct. An infectious process is not excluded. There are small patchy areas of ground-glass and nodular density in the right upper lobe which may represent areas of infarct or atypical pneumonia. Correlation with clinical exam and sputum cultures recommended. There is a 9 mm nodular density in the right upper lobe which appears new compared the prior study. A stable area of linear scarring noted extending from the right hilum into the right apex. The central airways are patent. The thoracic aorta appears unremarkable. The visualized portions of the origins of the great vessels are patent. There has been interval resolution of the previously seen pulmonary embolism. Evaluation of the pulmonary arteries is somewhat limited due to suboptimal opacification of the distal branches. No CT evidence of central pulmonary artery embolus identified. There is enlargement of the main pulmonary trunk suggestive of a degree of pulmonary hypertension. A a there is no cardiomegaly or pericardial effusion. Top-normal right hilar lymph node similar to prior study. There is no mediastinal adenopathy. The esophagus and the thyroid gland appear grossly unremarkable. There is no axillary adenopathy. The chest wall soft tissues appear unremarkable. There is mild degenerative changes of the spine. No acute fracture. Cirrhosis of the liver.  Splenomegaly. Review of the MIP images confirms  the above findings. IMPRESSION: No CT evidence of pulmonary embolism. Interval resolution of the previously seen pulmonary embolism. Small patchy areas of ground-glass density at the right lung base peripherally as well as in the right upper lobe may represent areas of pulmonary infarct since versus less likely infectious process. A 9 mm right upper lobe pulmonary nodule, new from prior study, and likely inflammatory/infectious in etiology. Small bilateral pleural effusions. Electronically Signed   By: Anner Crete M.D.   On:  05/31/2015 00:05   Ct Angio Chest Pe W/cm &/or Wo Cm  05/26/2015  CLINICAL DATA:  Dyspnea. EXAM: CT ANGIOGRAPHY CHEST WITH CONTRAST TECHNIQUE: Multidetector CT imaging of the chest was performed using the standard protocol during bolus administration of intravenous contrast. Multiplanar CT image reconstructions and MIPs were obtained to evaluate the vascular anatomy. CONTRAST:  100 mL Isovue 300 intravenous COMPARISON:  01/28/2014 FINDINGS: Cardiovascular: There is good opacification of pulmonary vasculature. There are pulmonary emboli within the lower lobe arteries of both lungs. There is enlargement of the central left pulmonary artery, suggesting pulmonary hypertension. There are findings suggesting chronic pulmonary emboli, with vessel wall thickening and some eccentric thrombus. However, there also is acute pulmonary embolus with a few saddle emboli across bifurcations distally. The thoracic aorta is normal in caliber and intact. Lungs: Peripheral airspace opacity in the right lower lobe could represent pulmonary infarction. Infectious infiltrate or atelectasis also possible. Chronic scarring in the right apex. Mild scattered mosaic attenuation suggests air trapping. Central airways: Patent Effusions: Trace right pleural effusion. Lymphadenopathy: None Esophagus: Unremarkable Upper abdomen: Cirrhotic liver, incompletely imaged. Musculoskeletal: No significant abnormality Review of  the MIP images confirms the above findings. IMPRESSION: The study is positive for pulmonary embolism, probably acute on chronic. There are findings suggesting pulmonary arterial hypertension. No evidence of right heart strain. Possible pulmonary infarction in the right lower lobe periphery, although this could also represent atelectasis or infection. Scarring in the right apex. Cirrhosis. These results were called by telephone at the time of interpretation on 05/26/2015 at 12:10 am to Dr. Duffy Bruce , who verbally acknowledged these results. Electronically Signed   By: Andreas Newport M.D.   On: 05/26/2015 00:15   Ct Renal Stone Study  05/25/2015  CLINICAL DATA:  Right flank pain since noon today. EXAM: CT ABDOMEN AND PELVIS WITHOUT CONTRAST TECHNIQUE: Multidetector CT imaging of the abdomen and pelvis was performed following the standard protocol without IV contrast. COMPARISON:  10/04/2013 FINDINGS: Small bilateral pleural effusions with atelectasis in the lung bases. Small pericardial effusion. Multiple bilateral intrarenal stones are demonstrated. Largest is in the left upper pole and measures about 5 mm. No hydronephrosis or hydroureter. No ureteral stones or bladder stones. No bladder wall thickening. Low-attenuation lesion in the midpole of the right kidney likely representing a cyst. No change since previous study. Changes of hepatic cirrhosis with nodular hepatic contour and enlargement of the lateral segment left lobe. Mild prominence of lymph nodes in the celiac axis and porta hepatis are likely related to cirrhosis. Spleen is mildly enlarged. Mild splenic varices. The unenhanced appearance of the gallbladder, pancreas, adrenal glands, abdominal aorta, inferior vena cava, and retroperitoneal lymph nodes is unremarkable. Small diverticulum of the second portion of the duodenum. Stomach, small bowel, and colon are not abnormally distended. Scattered stool in the colon. No free air or free fluid in the  abdomen. Pelvis: The appendix is normal. Uterus is surgically absent. No free or loculated pelvic fluid collections. No pelvic mass or lymphadenopathy. Degenerative changes in the spine. No destructive bone lesions. IMPRESSION: Multiple bilateral nonobstructing intrarenal stones. No hydronephrosis or hydroureter. No ureteral stones. Changes of hepatic cirrhosis with splenic enlargement. Small bilateral pleural effusions with infiltration or atelectasis in the lung bases. Small pericardial effusion. Electronically Signed   By: Lucienne Capers M.D.   On: 05/25/2015 21:17    Time Spent in minutes  30   SINGH,PRASHANT K M.D on 05/31/2015 at 10:43 AM  Between 7am to 7pm - Pager -  [phone]  After 7pm go to [email or web address] - password [facility]  [facility] -  Office  [phone]

## 2015-05-31 NOTE — Progress Notes (Signed)
ANTICOAGULATION CONSULT NOTE - Initial Consult  Pharmacy Consult for Xarelto Indication: DVT  Allergies  Allergen Reactions  . Heparin Other (See Comments)    HIT ab positive with elevated OD  . Amitriptyline Hcl     Other reaction(s): Other Causes her to be very "disoriented".  . Gabapentin Other (See Comments) and Rash    REACTION: rash in mouth Itching; rash  REACTION: rash in mouth Other reaction(s): RASH REACTION: rash in mouth  . Cymbalta [Duloxetine Hcl] Other (See Comments)    headaches  . Elavil [Amitriptyline] Other (See Comments)    Causes her to be very "disoriented".  . Neomycin-Bacitracin Zn-Polymyx Rash    Patient Measurements: Height: 5\' 4"  (162.6 cm) Weight: 230 lb (104.327 kg) IBW/kg (Calculated) : 54.7  Vital Signs: Temp: 97.7 F (36.5 C) (05/07 2121) Temp Source: Oral (05/07 2121) BP: 103/68 mmHg (05/07 2145) Pulse Rate: 98 (05/07 2145)  Labs:  Recent Labs  05/28/15 2030 05/30/15 2143  HGB 10.8* 10.5*  HCT 32.5* 32.2*  PLT 92* 85*  LABPROT 17.4*  --   INR 1.42  --   CREATININE 1.15* 1.20*  TROPONINI <0.03  --     Estimated Creatinine Clearance: 60.1 mL/min (by C-G formula based on Cr of 1.2).   Medical History: Past Medical History  Diagnosis Date  . COPD (chronic obstructive pulmonary disease) ([place])   . Cirrhosis ([place])   . Pulmonary emboli ([place]) 09/2013  . Kidney stones   . Heart murmur   . DVT (deep venous thrombosis) ([place]) 09/2013    RLE  . Pneumonia     "4 times in the past year" (10/30/2013)  . History of blood transfusion     "due to excessive blood loss before hysterectomy"  . GERD (gastroesophageal reflux disease)   . Hepatitis B   . Arthritis     "knees; lower back" (10/30/2013)  . Anxiety   . Depression   . Bipolar disorder ([place])   . Hypertension     currently on no medication   . Shortness of breath dyspnea     increased exertion;exercise  . Agoraphobia with panic attacks   . History of urinary tract infection    . Urinary incontinence   . Urinary frequency   . Altered mental state 10/26/2013  . Ankle fracture, left 11/27/2010    S/p ORIF 11/2   . Cavitary pneumonia   . CKD (chronic kidney disease), stage III     Medications:  See electronic med rec  Assessment: 60 y.o. F presents with SOB/CP. Pt was just discharged on Xarelto 15mg  po BID x 21 days - this was started on 5/3 so 20mg  daily should be started on 5/24. Xarelto 15 mg just given in ED. CT chest 5/7 shows no evidence of PE and Interval resolution of the previously seen pulmonary embolism. Estimated CrCl 60 ml/min. Hgb 10.5, plt 85 (chronic thrombocytopenia with baseline ~100).  Goal of Therapy:  Prevention of DVT/PE Monitor platelets by anticoagulation protocol: Yes   Plan:  Xarelto 15mg  po BID through 5/23 then start 20mg  po daily with supper on 5/24 Will f/u CBC and renal function  Sherlon Handing, PharmD, BCPS Clinical pharmacist, pager [phone] 05/31/2015,12:17 AM

## 2015-06-01 ENCOUNTER — Observation Stay (HOSPITAL_COMMUNITY): Payer: Medicare Other

## 2015-06-01 DIAGNOSIS — I2699 Other pulmonary embolism without acute cor pulmonale: Secondary | ICD-10-CM | POA: Diagnosis not present

## 2015-06-01 DIAGNOSIS — J441 Chronic obstructive pulmonary disease with (acute) exacerbation: Secondary | ICD-10-CM | POA: Diagnosis not present

## 2015-06-01 LAB — CBC
HCT: 34.9 % — ABNORMAL LOW (ref 36.0–46.0)
Hemoglobin: 11.3 g/dL — ABNORMAL LOW (ref 12.0–15.0)
MCH: 30.3 pg (ref 26.0–34.0)
MCHC: 32.4 g/dL (ref 30.0–36.0)
MCV: 93.6 fL (ref 78.0–100.0)
PLATELETS: 108 10*3/uL — AB (ref 150–400)
RBC: 3.73 MIL/uL — ABNORMAL LOW (ref 3.87–5.11)
RDW: 13.6 % (ref 11.5–15.5)
WBC: 3.6 10*3/uL — ABNORMAL LOW (ref 4.0–10.5)

## 2015-06-01 LAB — BASIC METABOLIC PANEL
Anion gap: 10 (ref 5–15)
BUN: 14 mg/dL (ref 6–20)
CHLORIDE: 108 mmol/L (ref 101–111)
CO2: 24 mmol/L (ref 22–32)
CREATININE: 1.4 mg/dL — AB (ref 0.44–1.00)
Calcium: 9 mg/dL (ref 8.9–10.3)
GFR calc Af Amer: 47 mL/min — ABNORMAL LOW (ref >60)
GFR calc non Af Amer: 41 mL/min — ABNORMAL LOW (ref >60)
Glucose, Bld: 105 mg/dL — ABNORMAL HIGH (ref 65–99)
Potassium: 4.1 mmol/L (ref 3.5–5.1)
Sodium: 142 mmol/L (ref 135–145)

## 2015-06-01 LAB — URINALYSIS, ROUTINE W REFLEX MICROSCOPIC
BILIRUBIN URINE: NEGATIVE
GLUCOSE, UA: NEGATIVE mg/dL
HGB URINE DIPSTICK: NEGATIVE
KETONES UR: NEGATIVE mg/dL
LEUKOCYTES UA: NEGATIVE
Nitrite: NEGATIVE
PH: 6 (ref 5.0–8.0)
Protein, ur: NEGATIVE mg/dL
Specific Gravity, Urine: 1.022 (ref 1.005–1.030)

## 2015-06-01 LAB — STREP PNEUMONIAE URINARY ANTIGEN: STREP PNEUMO URINARY ANTIGEN: NEGATIVE

## 2015-06-01 LAB — GLUCOSE, CAPILLARY: Glucose-Capillary: 111 mg/dL — ABNORMAL HIGH (ref 65–99)

## 2015-06-01 MED ORDER — SODIUM CHLORIDE 0.9 % IV SOLN
INTRAVENOUS | Status: AC
  Administered 2015-06-01 – 2015-06-02 (×2): via INTRAVENOUS

## 2015-06-01 MED ORDER — AMOXICILLIN-POT CLAVULANATE 875-125 MG PO TABS
1.0000 | ORAL_TABLET | Freq: Two times a day (BID) | ORAL | Status: DC
  Administered 2015-06-01 – 2015-06-02 (×3): 1 via ORAL
  Filled 2015-06-01 (×3): qty 1

## 2015-06-01 NOTE — Progress Notes (Signed)
Patient out of bed,giving herself sponge bath in front of sink.

## 2015-06-01 NOTE — Progress Notes (Signed)
Spoke the patient again for her home medications that needs to be brought up here as per requested by the pharmacy.Patient said ''don't worry about it.i will be all right without those medicines,anyway i am going home tomorrow".

## 2015-06-01 NOTE — Progress Notes (Signed)
PROGRESS NOTE                                                                                                                                                                                                             Patient Demographics:    [name], is a 60 y.o. female, DOB - [birth year], GX:[id number]  Admit date - 05/30/2015   Admitting Physician Etta Quill, DO  Outpatient Primary MD for the patient is Luetta Nutting, DO  [facility] -   Outpatient Specialists:   Chief Complaint  Patient presents with  . Shortness of Breath  . Chest Pain  . Flank Pain       Brief Narrative   [name] is a 60 y.o. female with medical history significant of cirrhosis, recent PE, admitted to our service from 5/2 to 5/3. Discharged on Xarelto but she wasn't able to get this filled yet due to insurance issues. She returns to the ED with worsening SOB and chest pain. Patient reports fever at home yesterday of up to 101. Has worsening cough, wheezing.  She was eventually admitted for pneumonia, pulmonary infarction, she also had evidence of narcotic and benzo overdose with somnolence, she was placed on antibiotics with good improvement, narcotics benzodiazepines minimized, she is now much alert awake unfortunately she has developed mild acute renal failure likely due to combination of NSAID and IV contrast for CT scan. We will get IV fluids and renal ultrasound. If BMP better can be discharged tomorrow.    Subjective:    [name] today has, No headache, No chest pain, No abdominal pain - No Nausea, No new weakness tingling or numbness, No Cough - SOB.    Assessment  & Plan :     1. Pneumonia in the setting of pulmonary infarction due to recent PE. Afebrile, mildly leukopenic, not septic, lactate borderline top normal due to pulmonary infarction. Appears nontoxic, placed on Unasyn and will switch to Augmentin on 06/01/2015,  follow cultures so far negative. Supportive care with oxygen (currently off oxygen ) and nebulizer treatments. Increase activity. Denies any history suggestive of aspiration.  2. Recent left lower extremity DVT, PE. Replaced on xaralto, patient noncompliant with it counseled, she apparently has full coverage for xaralto and her prescription was ready at the pharmacy which she never picked after last discharge. Case  manager on the case.  3. 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 Outpatient follow-up with PCP.  4. Pancytopenia due to #3 above. Monitor clinically. Note DIC panel inconclusive likely due to pulmonary infarction and xaralto, no schistocytes, stable 5 inversion level and Clinically she has no DIC.  5. Underlying COPD. No wheezing, no exacerbation. Supportive care.  6. Morbid obesity. Follow with PCP for weight loss.  7. Excessive narcotic use with somnolence. Reduce benzodiazepines and narcotics, patient counseled. Much more alert awake today.  8. Hypokalemia. Replaced and stable.  9. GERD. On PPI.  10. Depression. Not suicidal or homicidal, continue Celexa.  11. Mild ARF. Developed likely due to combination of IV contrast for CT and NSAID for chest pain. Hydrate, check renal ultrasound, repeat BMP in the morning, stop NSAIDs.    Code Status : Full  Family Communication  : None present  Disposition Plan  :  discharge home in 1-2 days  Barriers For Discharge : Treatment of pneumonia  Consults  :  None  Procedures  :   CTA chest - Pulmonary infarction  Renal US  DVT Prophylaxis  :  Xaralto  Lab Results  Component Value Date   PLT 108* 06/01/2015    Antibiotics  :     Anti-infectives    Start     Dose/Rate Route Frequency Ordered Stop   06/01/15 1000  amoxicillin-clavulanate (AUGMENTIN) 875-125 MG per tablet 1 tablet     1 tablet Oral Every 12 hours 06/01/15 0916     05/31/15 1300  ampicillin-sulbactam (UNASYN) 1.5 g in sodium  chloride 0.9 % 50 mL IVPB  Status:  Discontinued     1.5 g 100 mL/hr over 30 Minutes Intravenous Every 6 hours 05/31/15 1123 06/01/15 0916   05/31/15 1000  entecavir (BARACLUDE) tablet 0.5 mg     0.5 mg Oral Every other day 05/31/15 0014     05/31/15 0800  cefTRIAXone (ROCEPHIN) 1 g in dextrose 5 % 50 mL IVPB  Status:  Discontinued     1 g 100 mL/hr over 30 Minutes Intravenous Every 24 hours 05/31/15 0025 05/31/15 1042   05/31/15 0800  azithromycin (ZITHROMAX) tablet 500 mg  Status:  Discontinued     500 mg Oral Every 24 hours 05/31/15 0025 05/31/15 1042   05/30/15 2300  ceFEPIme (MAXIPIME) 2 g in dextrose 5 % 50 mL IVPB     2 g 100 mL/hr over 30 Minutes Intravenous  Once 05/30/15 2247 05/31/15 0008   05/30/15 2300  vancomycin (VANCOCIN) IVPB 1000 mg/200 mL premix     1,000 mg 200 mL/hr over 60 Minutes Intravenous  Once 05/30/15 2247 05/31/15 0108        Objective:   Filed Vitals:   05/31/15 2138 06/01/15 0510 06/01/15 0834 06/01/15 0900  BP:  130/70  98/57  Pulse:  51 60 61  Temp:  97.6 F (36.4 C)  97.5 F (36.4 C)  TempSrc:  Oral  Oral  Resp:  18 18 16   Height:      Weight:      SpO2: 93% 97% 94% 91%    Wt Readings from Last 3 Encounters:  05/31/15 105.28 kg (232 lb 1.6 oz)  05/28/15 108.863 kg (240 lb)  05/27/15 107.548 kg (237 lb 1.6 oz)     Intake/Output Summary (Last 24 hours) at 06/01/15 1117 Last data filed at 06/01/15 0900  Gross per 24 hour  Intake   1350 ml  Output  1100 ml  Net    250 ml     Physical Exam  More awake, Alert, Oriented X 3, No new F.N deficits, Normal affect [place].AT,PERRAL Supple Neck,No JVD, No cervical lymphadenopathy appriciated.  Symmetrical Chest wall movement, Good air movement bilaterally, CTAB RRR,No Gallops,Rubs or new Murmurs, No Parasternal Heave +ve B.Sounds, Abd Soft, No tenderness, No organomegaly appriciated, No rebound - guarding or rigidity. No Cyanosis, Clubbing or edema, No new Rash or bruise      Data Review:     CBC  Recent Labs Lab 05/25/15 1944 05/26/15 0219 05/28/15 2030 05/30/15 2143 05/31/15 0026 05/31/15 0220 06/01/15 0653  WBC 6.7 5.4 2.1* 1.8*  --  2.5* 3.6*  HGB 14.3 12.0 10.8* 10.5*  --  10.2* 11.3*  HCT 42.3 36.4 32.5* 32.2*  --  30.6* 34.9*  PLT 109* 103* 92* 85* 97* 98* 108*  MCV 91.4 89.2 91.8 89.9  --  91.6 93.6  MCH 30.9 29.4 30.5 29.3  --  30.5 30.3  MCHC 33.8 33.0 33.2 32.6  --  33.3 32.4  RDW 13.0 12.8 12.9 13.0  --  13.2 13.6  LYMPHSABS 1.8  --   --   --   --   --   --   MONOABS 0.5  --   --   --   --   --   --   EOSABS 0.1  --   --   --   --   --   --   BASOSABS 0.0  --   --   --   --   --   --     Chemistries   Recent Labs Lab 05/25/15 1944 05/26/15 0219 05/28/15 2030 05/30/15 2143 05/31/15 0220 06/01/15 0653  NA 139 136 139 142 140 142  K 4.9 3.6 3.2* 3.4* 3.3* 4.1  CL 101 103 105 108 105 108  CO2 26 24 23 22 24 24   GLUCOSE 100* 144* 107* 154* 135* 105*  BUN 14 13 9 12 11 14   CREATININE 1.28* 1.28* 1.15* 1.20* 1.17* 1.40*  CALCIUM 9.7 9.0 8.9 9.4 9.2 9.0  AST 28  --   --   --   --   --   ALT 17  --   --   --   --   --   ALKPHOS 82  --   --   --   --   --   BILITOT 1.8*  --   --   --   --   --    ------------------------------------------------------------------------------------------------------------------ No results for input(s): CHOL, HDL, LDLCALC, TRIG, CHOLHDL, LDLDIRECT in the last 72 hours.  Lab Results  Component Value Date   HGBA1C 5.0 12/09/2013   ------------------------------------------------------------------------------------------------------------------ No results for input(s): TSH, T4TOTAL, T3FREE, THYROIDAB in the last 72 hours.  Invalid input(s): FREET3 ------------------------------------------------------------------------------------------------------------------ No results for input(s): VITAMINB12, FOLATE, FERRITIN, TIBC, IRON, RETICCTPCT in the last 72 hours.  Coagulation profile  Recent Labs Lab  05/26/15 0219 05/28/15 2030 05/31/15 0026  INR 1.37 1.42 1.50*     Recent Labs  05/31/15 0026  DDIMER 3.03*    Cardiac Enzymes  Recent Labs Lab 05/26/15 0219 05/28/15 2030  TROPONINI <0.03 <0.03   ------------------------------------------------------------------------------------------------------------------    Component Value Date/Time   BNP 74.6 05/28/2015 2047    Inpatient Medications  Scheduled Meds: . ALPRAZolam  0.5 mg Oral QHS  . amoxicillin-clavulanate  1 tablet Oral Q12H  . citalopram  20 mg Oral QHS  . doxepin  50 mg Oral QHS  . entecavir  0.5 mg Oral QODAY  . iloperidone  12 mg Oral Daily  . mometasone-formoterol  2 puff Inhalation BID  . pantoprazole  40 mg Oral Daily  . Rivaroxaban  15 mg Oral BID WC  . [START ON 06/16/2015] rivaroxaban  20 mg Oral Q supper   Continuous Infusions: . sodium chloride     PRN Meds:.albuterol, traMADol  Micro Results Recent Results (from the past 240 hour(s))  Urine culture     Status: Abnormal   Collection Time: 05/25/15  8:21 PM  Result Value Ref Range Status   Specimen Description URINE, CLEAN CATCH  Final   Special Requests Normal  Final   Culture MULTIPLE SPECIES PRESENT, SUGGEST RECOLLECTION (A)  Final   Report Status 05/27/2015 FINAL  Final  MRSA PCR Screening     Status: None   Collection Time: 05/26/15  2:00 AM  Result Value Ref Range Status   MRSA by PCR NEGATIVE NEGATIVE Final    Comment:        The GeneXpert MRSA Assay (FDA approved for NASAL specimens only), is one component of a comprehensive MRSA colonization surveillance program. It is not intended to diagnose MRSA infection nor to guide or monitor treatment for MRSA infections.     Radiology Reports Dg Chest 2 View  05/30/2015  CLINICAL DATA:  60 year old female with shortness of breath and chest pain EXAM: CHEST  2 VIEW COMPARISON:  Chest radiograph dated 05/28/2015 FINDINGS: Two views of the chest demonstrate stable appearing small  bilateral pleural effusions with bibasilar platelike atelectatic changes. There is no focal consolidation or pneumothorax. Stable cardiac silhouette. No acute osseous pathology. IMPRESSION: Small bilateral pleural effusions with no significant interval change. Electronically Signed   By: Anner Crete M.D.   On: 05/30/2015 22:09   Dg Chest 2 View  05/28/2015  CLINICAL DATA:  60 year old female with shortness of breath and chest pain for 4 days with recent diagnosis of pulmonary emboli. EXAM: CHEST  2 VIEW COMPARISON:  05/25/2015 CT.  12/04/2014 and prior chest radiographs. FINDINGS: The cardiomediastinal silhouette is unchanged with mild cardiomegaly. Small bilateral pleural effusions are identified with mild bibasilar atelectasis. There is no evidence of pulmonary edema, pneumothorax or pulmonary mass. No acute bony abnormalities are noted. IMPRESSION: Small bilateral pleural effusions and mild bibasilar atelectasis. Electronically Signed   By: Margarette Canada M.D.   On: 05/28/2015 20:18   Ct Angio Chest Pe W/cm &/or Wo Cm  05/31/2015  CLINICAL DATA:  60 year old female with chest pain and shortness of breath. EXAM: CT ANGIOGRAPHY CHEST WITH CONTRAST TECHNIQUE: Multidetector CT imaging of the chest was performed using the standard protocol during bolus administration of intravenous contrast. Multiplanar CT image reconstructions and MIPs were obtained to evaluate the vascular anatomy. CONTRAST:  80 cc Isovue 370 COMPARISON:  Chest radiograph dated 05/30/2015 and chest CT dated 05/25/2015 FINDINGS: There small bilateral pleural effusions grossly similar to prior study. Bibasilar linear atelectasis/ scarring noted. There are small subpleural areas of ground-glass and nodular density at the right lung base, likely representing areas of pulmonary infarct. An infectious process is not excluded. There are small patchy areas of ground-glass and nodular density in the right upper lobe which may represent areas of infarct  or atypical pneumonia. Correlation with clinical exam and sputum cultures recommended. There is a 9 mm nodular density in the right upper lobe which appears new compared the prior study. A stable area of linear scarring noted extending from  the right hilum into the right apex. The central airways are patent. The thoracic aorta appears unremarkable. The visualized portions of the origins of the great vessels are patent. There has been interval resolution of the previously seen pulmonary embolism. Evaluation of the pulmonary arteries is somewhat limited due to suboptimal opacification of the distal branches. No CT evidence of central pulmonary artery embolus identified. There is enlargement of the main pulmonary trunk suggestive of a degree of pulmonary hypertension. A a there is no cardiomegaly or pericardial effusion. Top-normal right hilar lymph node similar to prior study. There is no mediastinal adenopathy. The esophagus and the thyroid gland appear grossly unremarkable. There is no axillary adenopathy. The chest wall soft tissues appear unremarkable. There is mild degenerative changes of the spine. No acute fracture. Cirrhosis of the liver.  Splenomegaly. Review of the MIP images confirms the above findings. IMPRESSION: No CT evidence of pulmonary embolism. Interval resolution of the previously seen pulmonary embolism. Small patchy areas of ground-glass density at the right lung base peripherally as well as in the right upper lobe may represent areas of pulmonary infarct since versus less likely infectious process. A 9 mm right upper lobe pulmonary nodule, new from prior study, and likely inflammatory/infectious in etiology. Small bilateral pleural effusions. Electronically Signed   By: Anner Crete M.D.   On: 05/31/2015 00:05   Ct Angio Chest Pe W/cm &/or Wo Cm  05/26/2015  CLINICAL DATA:  Dyspnea. EXAM: CT ANGIOGRAPHY CHEST WITH CONTRAST TECHNIQUE: Multidetector CT imaging of the chest was performed  using the standard protocol during bolus administration of intravenous contrast. Multiplanar CT image reconstructions and MIPs were obtained to evaluate the vascular anatomy. CONTRAST:  100 mL Isovue 300 intravenous COMPARISON:  01/28/2014 FINDINGS: Cardiovascular: There is good opacification of pulmonary vasculature. There are pulmonary emboli within the lower lobe arteries of both lungs. There is enlargement of the central left pulmonary artery, suggesting pulmonary hypertension. There are findings suggesting chronic pulmonary emboli, with vessel wall thickening and some eccentric thrombus. However, there also is acute pulmonary embolus with a few saddle emboli across bifurcations distally. The thoracic aorta is normal in caliber and intact. Lungs: Peripheral airspace opacity in the right lower lobe could represent pulmonary infarction. Infectious infiltrate or atelectasis also possible. Chronic scarring in the right apex. Mild scattered mosaic attenuation suggests air trapping. Central airways: Patent Effusions: Trace right pleural effusion. Lymphadenopathy: None Esophagus: Unremarkable Upper abdomen: Cirrhotic liver, incompletely imaged. Musculoskeletal: No significant abnormality Review of the MIP images confirms the above findings. IMPRESSION: The study is positive for pulmonary embolism, probably acute on chronic. There are findings suggesting pulmonary arterial hypertension. No evidence of right heart strain. Possible pulmonary infarction in the right lower lobe periphery, although this could also represent atelectasis or infection. Scarring in the right apex. Cirrhosis. These results were called by telephone at the time of interpretation on 05/26/2015 at 12:10 am to Dr. Duffy Bruce , who verbally acknowledged these results. Electronically Signed   By: Andreas Newport M.D.   On: 05/26/2015 00:15   Ct Renal Stone Study  05/25/2015  CLINICAL DATA:  Right flank pain since noon today. EXAM: CT ABDOMEN AND  PELVIS WITHOUT CONTRAST TECHNIQUE: Multidetector CT imaging of the abdomen and pelvis was performed following the standard protocol without IV contrast. COMPARISON:  10/04/2013 FINDINGS: Small bilateral pleural effusions with atelectasis in the lung bases. Small pericardial effusion. Multiple bilateral intrarenal stones are demonstrated. Largest is in the left upper pole and measures about 5 mm.  No hydronephrosis or hydroureter. No ureteral stones or bladder stones. No bladder wall thickening. Low-attenuation lesion in the midpole of the right kidney likely representing a cyst. No change since previous study. Changes of hepatic cirrhosis with nodular hepatic contour and enlargement of the lateral segment left lobe. Mild prominence of lymph nodes in the celiac axis and porta hepatis are likely related to cirrhosis. Spleen is mildly enlarged. Mild splenic varices. The unenhanced appearance of the gallbladder, pancreas, adrenal glands, abdominal aorta, inferior vena cava, and retroperitoneal lymph nodes is unremarkable. Small diverticulum of the second portion of the duodenum. Stomach, small bowel, and colon are not abnormally distended. Scattered stool in the colon. No free air or free fluid in the abdomen. Pelvis: The appendix is normal. Uterus is surgically absent. No free or loculated pelvic fluid collections. No pelvic mass or lymphadenopathy. Degenerative changes in the spine. No destructive bone lesions. IMPRESSION: Multiple bilateral nonobstructing intrarenal stones. No hydronephrosis or hydroureter. No ureteral stones. Changes of hepatic cirrhosis with splenic enlargement. Small bilateral pleural effusions with infiltration or atelectasis in the lung bases. Small pericardial effusion. Electronically Signed   By: Lucienne Capers M.D.   On: 05/25/2015 21:17    Time Spent in minutes  30   SINGH,PRASHANT K M.D on 06/01/2015 at 11:17 AM  Between 7am to 7pm - Pager - [phone]  After 7pm go to  [email or web address] - password [facility]  [facility] -  Office  [phone]

## 2015-06-02 ENCOUNTER — Inpatient Hospital Stay: Payer: Self-pay | Admitting: Internal Medicine

## 2015-06-02 DIAGNOSIS — J441 Chronic obstructive pulmonary disease with (acute) exacerbation: Secondary | ICD-10-CM | POA: Diagnosis not present

## 2015-06-02 DIAGNOSIS — J432 Centrilobular emphysema: Secondary | ICD-10-CM | POA: Diagnosis not present

## 2015-06-02 DIAGNOSIS — I2699 Other pulmonary embolism without acute cor pulmonale: Secondary | ICD-10-CM | POA: Diagnosis not present

## 2015-06-02 DIAGNOSIS — J189 Pneumonia, unspecified organism: Secondary | ICD-10-CM | POA: Diagnosis not present

## 2015-06-02 LAB — BASIC METABOLIC PANEL
ANION GAP: 12 (ref 5–15)
BUN: 14 mg/dL (ref 6–20)
CALCIUM: 8.9 mg/dL (ref 8.9–10.3)
CO2: 23 mmol/L (ref 22–32)
Chloride: 107 mmol/L (ref 101–111)
Creatinine, Ser: 1.27 mg/dL — ABNORMAL HIGH (ref 0.44–1.00)
GFR calc Af Amer: 53 mL/min — ABNORMAL LOW (ref >60)
GFR calc non Af Amer: 46 mL/min — ABNORMAL LOW (ref >60)
GLUCOSE: 104 mg/dL — AB (ref 65–99)
Potassium: 4.3 mmol/L (ref 3.5–5.1)
Sodium: 142 mmol/L (ref 135–145)

## 2015-06-02 LAB — GLUCOSE, CAPILLARY: Glucose-Capillary: 87 mg/dL (ref 65–99)

## 2015-06-02 MED ORDER — AMOXICILLIN-POT CLAVULANATE 875-125 MG PO TABS: 1.0000 | ORAL_TABLET | Freq: Two times a day (BID) | ORAL | Status: DC

## 2015-06-02 MED ORDER — OXYCODONE HCL 5 MG PO TABS: 5.0000 mg | ORAL_TABLET | Freq: Four times a day (QID) | ORAL | Status: DC | PRN

## 2015-06-02 MED ORDER — ALPRAZOLAM 1 MG PO TABS: 0.5000 mg | ORAL_TABLET | Freq: Two times a day (BID) | ORAL | Status: DC | PRN

## 2015-06-02 NOTE — Progress Notes (Signed)
Patient discharge teaching given, including activity, diet, follow-up appoints, and medications. Patient verbalized understanding of all discharge instructions.  Xarelto discount card provided.  IV access was d/c'd. Vitals are stable. Skin is intact except as charted in most recent assessments. Pt to be escorted out by NT, to be driven home by family.  Jillyn Ledger, MBA, BSN, RN

## 2015-06-02 NOTE — Discharge Summary (Signed)
[name], is a 60 y.o. female  DOB 1955-11-08  MRN [id number].  Admission date:  05/30/2015  Admitting Physician  Etta Quill, DO  Discharge Date:  06/02/2015   Primary MD  Luetta Nutting, DO  Recommendations for primary care physician for things to follow:  - Follow 2 view chest x-ray in 2 weeks    Admission Diagnosis  Leukopenia [D72.819] Tachycardia [R00.0] COPD exacerbation ([place]) [J44.1] CAP (community acquired pneumonia) [J18.9]   Discharge Diagnosis  Leukopenia [D72.819] Tachycardia [R00.0] COPD exacerbation ([place]) [J44.1] CAP (community acquired pneumonia) [J18.9]    Principal Problem:   PE (pulmonary embolism) Active Problems:   Hepatic cirrhosis ([place])   COPD (chronic obstructive pulmonary disease) ([place])   Right lower lobe pneumonia   Right upper lobe pneumonia   CAP (community acquired pneumonia)   Thrombocytopenia ([place])      Past Medical History  Diagnosis Date  . COPD (chronic obstructive pulmonary disease) ([place])   . Cirrhosis ([place])   . Pulmonary emboli ([place]) 09/2013  . Kidney stones   . Heart murmur   . DVT (deep venous thrombosis) ([place]) 09/2013    RLE  . Pneumonia     "4 times in the past year" (10/30/2013)  . History of blood transfusion     "due to excessive blood loss before hysterectomy"  . GERD (gastroesophageal reflux disease)   . Hepatitis B   . Arthritis     "knees; lower back" (10/30/2013)  . Anxiety   . Depression   . Bipolar disorder ([place])   . Hypertension     currently on no medication   . Shortness of breath dyspnea     increased exertion;exercise  . Agoraphobia with panic attacks   . History of urinary tract infection   . Urinary incontinence   . Urinary frequency   . Altered mental state 10/26/2013  . Ankle fracture, left 11/27/2010    S/p ORIF 11/2   . Cavitary pneumonia   . CKD (chronic kidney disease), stage III     Past Surgical  History  Procedure Laterality Date  . Carpal tunnel release Left   . Fracture surgery    . Ankle fracture surgery Left   . Abdominal hysterectomy    . Tubal ligation    . Dilation and curettage of uterus  X 2  . Hand surgery      1980's/left hand   . Laparoscopic assisted ventral hernia repair N/A 12/10/2014    Procedure: LAPAROSCOPIC ASSISTED REPAIR OF INCARCERATED UMBILICAL HERNIA ;  Surgeon: Michael Boston, MD;  Location: [facility];  Service: General;  Laterality: N/A;  . Insertion of mesh N/A 12/10/2014    Procedure: INSERTION OF MESH;  Surgeon: Michael Boston, MD;  Location: [facility];  Service: General;  Laterality: N/A;       History of present illness and  Hospital Course:     Kindly see H&P for history of present illness and admission details, please review complete Labs, Consult reports and Test reports for all details  in brief  HPI  from the history and physical done on the day of admission 05/31/2015  HPI: [name] is a 60 y.o. female with medical history significant of cirrhosis, recent PE, admitted to our service from 5/2 to 5/3. Discharged on Xarelto but she wasn't able to get this filled yet due to insurance issues. She returns to the ED with worsening SOB and chest pain. Patient reports fever at home yesterday of up to 101. Has worsening cough, wheezing.  ED Course: Leukopenia with WBC 1.8k, this is new. She also has chronic thrombocytopenia that is likely secondary to her known cirrhosis of the liver. Initially tachycardic to 110s this improves with 1L IVF bolus   Hospital Course   Brief Narrative [name] is a 60 y.o. female with medical history significant of cirrhosis, recent PE, admitted to our service from 5/2 to 5/3. Discharged on Xarelto but she wasn't able to get this filled yet due to insurance issues. She returns to the ED with worsening SOB and chest pain. Patient reports fever at home yesterday of up to 101. Has worsening cough, wheezing.  She  was eventually admitted for pneumonia, pulmonary infarction, she also had evidence of narcotic and benzo overdose with somnolence, she was placed on antibiotics with good improvement, narcotics benzodiazepines minimized, she is now much alert awake ,  with slight bump in her creatinine most likely related to NSAID, creatinine back to baseline at date of discharge.  1. Pneumonia in the setting of pulmonary infarction due to recent PE. Afebrile, mildly leukopenic, not septic, lactate borderline top normal due to pulmonary infarction. Appears nontoxic, placed on Unasyn transitioned to Augmentin, to finish another 4 days as an outpatient, total of 7 days treatment, follow cultures so far negative. Denies any history suggestive of aspiration.  2. Recent left lower extremity DVT, PE. Replaced on xaralto, patient noncompliant with it counseled, she apparently has full coverage for xaralto and her prescription was ready at the pharmacy which she never picked after last discharge. Case manager on the case.  3. 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 Outpatient follow-up with PCP.  4. Pancytopenia due to #3 above. Monitor clinically. Note DIC panel inconclusive likely due to pulmonary infarction and xaralto, no schistocytes, stable 5 inversion level and Clinically she has no DIC.  5. Underlying COPD. No wheezing, no exacerbation. Supportive care.  6. Morbid obesity. Follow with PCP for weight loss.  7. Excessive narcotic use with somnolence. Reduce benzodiazepines and narcotics, patient counseled.  alert awake today.  8. Hypokalemia. Replaced and stable.  9. GERD. On PPI.  10. Depression. Not suicidal or homicidal, continue Celexa.  11. Mild ARF. Developed likely due to combination of IV contrast for CT and NSAID , renal ultrasound is no evidence of hydronephrosis, creatinine is 1.27 on discharge, stop NSAIDs.  Discharge Condition:  stable   Follow UP  Follow-up  Information    Follow up with Luetta Nutting, DO.   Specialty:  Family Medicine   Why:  appointment scheduled for Jun 07, 2015 at 1:15 pm    Contact information:   [address] [place] [address] [phone]         Discharge Instructions  and  Discharge Medications         Discharge Instructions    Diet - low sodium heart healthy    Complete by:  As directed      Discharge instructions    Complete  by:  As directed   Follow with Primary MD Luetta Nutting, DO on scheduled appointment  Get CBC, CMP, 2 view Chest X ray checked  by Primary MD next visit.    Activity: As tolerated with Full fall precautions use walker/cane & assistance as needed   Disposition Home    Diet: Heart Healthy  , with feeding assistance and aspiration precautions.  For Heart failure patients - Check your Weight same time everyday, if you gain over 2 pounds, or you develop in leg swelling, experience more shortness of breath or chest pain, call your Primary MD immediately. Follow Cardiac Low Salt Diet and 1.5 lit/day fluid restriction.   On your next visit with your primary care physician please Get Medicines reviewed and adjusted.   Please request your Prim.MD to go over all Hospital Tests and Procedure/Radiological results at the follow up, please get all Hospital records sent to your Prim MD by signing hospital release before you go home.   If you experience worsening of your admission symptoms, develop shortness of breath, life threatening emergency, suicidal or homicidal thoughts you must seek medical attention immediately by calling 911 or calling your MD immediately  if symptoms less severe.  You Must read complete instructions/literature along with all the possible adverse reactions/side effects for all the Medicines you take and that have been prescribed to you. Take any new Medicines after you have completely understood and accpet all the possible adverse reactions/side effects.     Do not drive, operating heavy machinery, perform activities at heights, swimming or participation in water activities or provide baby sitting services if your were admitted for syncope or siezures until you have seen by Primary MD or a Neurologist and advised to do so again.  Do not drive when taking Pain medications.    Do not take more than prescribed Pain, Sleep and Anxiety Medications  Special Instructions: If you have smoked or chewed Tobacco  in the last 2 yrs please stop smoking, stop any regular Alcohol  and or any Recreational drug use.  Wear Seat belts while driving.   Please note  You were cared for by a hospitalist during your hospital stay. If you have any questions about your discharge medications or the care you received while you were in the hospital after you are discharged, you can call the unit and asked to speak with the hospitalist on call if the hospitalist that took care of you is not available. Once you are discharged, your primary care physician will handle any further medical issues. Please note that NO REFILLS for any discharge medications will be authorized once you are discharged, as it is imperative that you return to your primary care physician (or establish a relationship with a primary care physician if you do not have one) for your aftercare needs so that they can reassess your need for medications and monitor your lab values.     Increase activity slowly    Complete by:  As directed             Medication List    STOP taking these medications        ciprofloxacin 250 MG tablet  Commonly known as:  CIPRO     ibuprofen 200 MG tablet  Commonly known as:  ADVIL,MOTRIN     traMADol 50 MG tablet  Commonly known as:  ULTRAM      TAKE these medications        AEROCHAMBER MV inhaler  Use as  instructed     ALPRAZolam 1 MG tablet  Commonly known as:  XANAX  Take 0.5 tablets (0.5 mg total) by mouth 2 (two) times daily as needed for anxiety.      amoxicillin-clavulanate 875-125 MG tablet  Commonly known as:  AUGMENTIN  Take 1 tablet by mouth every 12 (twelve) hours.     budesonide-formoterol 80-4.5 MCG/ACT inhaler  Commonly known as:  SYMBICORT  Inhale 2 puffs into the lungs 2 (two) times daily.     citalopram 20 MG tablet  Commonly known as:  CELEXA  Take 20 mg by mouth at bedtime.     doxepin 25 MG capsule  Commonly known as:  SINEQUAN  Take 2 capsules by mouth at bedtime.     entecavir 0.5 MG tablet  Commonly known as:  BARACLUDE  Take 0.5 mg by mouth every other day.     FANAPT 6 MG Tabs  Generic drug:  Iloperidone  Take 12 mg by mouth every morning.     oxyCODONE 5 MG immediate release tablet  Commonly known as:  Oxy IR/ROXICODONE  Take 1 tablet (5 mg total) by mouth every 6 (six) hours as needed for severe pain or breakthrough pain.     pantoprazole 40 MG tablet  Commonly known as:  PROTONIX  Take 40 mg by mouth daily.     PROAIR HFA 108 (90 Base) MCG/ACT inhaler  Generic drug:  albuterol  INHALE 2 PUFFS INTO THE LUNGS EVERY 6 HOURS AS NEEDED FOR WHEEZING OR SHORTNESS OF BREATH.     Rivaroxaban 15 & 20 MG Tbpk  Take as directed on package: Start with one 15mg  tablet by mouth twice a day with food. On Day 22, switch to one 20mg  tablet once a day with food.     spironolactone 50 MG tablet  Commonly known as:  ALDACTONE  Take 100 mg by mouth every morning.     temazepam 30 MG capsule  Commonly known as:  RESTORIL  Take 30 mg by mouth at bedtime.          Diet and Activity recommendation: See Discharge Instructions above   Consults obtained -  none   Major procedures and Radiology Reports - PLEASE review detailed and final reports for all details, in brief -      Dg Chest 2 View  05/30/2015  CLINICAL DATA:  60 year old female with shortness of breath and chest pain EXAM: CHEST  2 VIEW COMPARISON:  Chest radiograph dated 05/28/2015 FINDINGS: Two views of the chest demonstrate stable appearing  small bilateral pleural effusions with bibasilar platelike atelectatic changes. There is no focal consolidation or pneumothorax. Stable cardiac silhouette. No acute osseous pathology. IMPRESSION: Small bilateral pleural effusions with no significant interval change. Electronically Signed   By: Anner Crete M.D.   On: 05/30/2015 22:09   Dg Chest 2 View  05/28/2015  CLINICAL DATA:  60 year old female with shortness of breath and chest pain for 4 days with recent diagnosis of pulmonary emboli. EXAM: CHEST  2 VIEW COMPARISON:  05/25/2015 CT.  12/04/2014 and prior chest radiographs. FINDINGS: The cardiomediastinal silhouette is unchanged with mild cardiomegaly. Small bilateral pleural effusions are identified with mild bibasilar atelectasis. There is no evidence of pulmonary edema, pneumothorax or pulmonary mass. No acute bony abnormalities are noted. IMPRESSION: Small bilateral pleural effusions and mild bibasilar atelectasis. Electronically Signed   By: Margarette Canada M.D.   On: 05/28/2015 20:18   Ct Angio Chest Pe W/cm &/or Wo Cm  05/31/2015  CLINICAL DATA:  60 year old female with chest pain and shortness of breath. EXAM: CT ANGIOGRAPHY CHEST WITH CONTRAST TECHNIQUE: Multidetector CT imaging of the chest was performed using the standard protocol during bolus administration of intravenous contrast. Multiplanar CT image reconstructions and MIPs were obtained to evaluate the vascular anatomy. CONTRAST:  80 cc Isovue 370 COMPARISON:  Chest radiograph dated 05/30/2015 and chest CT dated 05/25/2015 FINDINGS: There small bilateral pleural effusions grossly similar to prior study. Bibasilar linear atelectasis/ scarring noted. There are small subpleural areas of ground-glass and nodular density at the right lung base, likely representing areas of pulmonary infarct. An infectious process is not excluded. There are small patchy areas of ground-glass and nodular density in the right upper lobe which may represent areas of  infarct or atypical pneumonia. Correlation with clinical exam and sputum cultures recommended. There is a 9 mm nodular density in the right upper lobe which appears new compared the prior study. A stable area of linear scarring noted extending from the right hilum into the right apex. The central airways are patent. The thoracic aorta appears unremarkable. The visualized portions of the origins of the great vessels are patent. There has been interval resolution of the previously seen pulmonary embolism. Evaluation of the pulmonary arteries is somewhat limited due to suboptimal opacification of the distal branches. No CT evidence of central pulmonary artery embolus identified. There is enlargement of the main pulmonary trunk suggestive of a degree of pulmonary hypertension. A a there is no cardiomegaly or pericardial effusion. Top-normal right hilar lymph node similar to prior study. There is no mediastinal adenopathy. The esophagus and the thyroid gland appear grossly unremarkable. There is no axillary adenopathy. The chest wall soft tissues appear unremarkable. There is mild degenerative changes of the spine. No acute fracture. Cirrhosis of the liver.  Splenomegaly. Review of the MIP images confirms the above findings. IMPRESSION: No CT evidence of pulmonary embolism. Interval resolution of the previously seen pulmonary embolism. Small patchy areas of ground-glass density at the right lung base peripherally as well as in the right upper lobe may represent areas of pulmonary infarct since versus less likely infectious process. A 9 mm right upper lobe pulmonary nodule, new from prior study, and likely inflammatory/infectious in etiology. Small bilateral pleural effusions. Electronically Signed   By: Anner Crete M.D.   On: 05/31/2015 00:05   Ct Angio Chest Pe W/cm &/or Wo Cm  05/26/2015  CLINICAL DATA:  Dyspnea. EXAM: CT ANGIOGRAPHY CHEST WITH CONTRAST TECHNIQUE: Multidetector CT imaging of the chest was  performed using the standard protocol during bolus administration of intravenous contrast. Multiplanar CT image reconstructions and MIPs were obtained to evaluate the vascular anatomy. CONTRAST:  100 mL Isovue 300 intravenous COMPARISON:  01/28/2014 FINDINGS: Cardiovascular: There is good opacification of pulmonary vasculature. There are pulmonary emboli within the lower lobe arteries of both lungs. There is enlargement of the central left pulmonary artery, suggesting pulmonary hypertension. There are findings suggesting chronic pulmonary emboli, with vessel wall thickening and some eccentric thrombus. However, there also is acute pulmonary embolus with a few saddle emboli across bifurcations distally. The thoracic aorta is normal in caliber and intact. Lungs: Peripheral airspace opacity in the right lower lobe could represent pulmonary infarction. Infectious infiltrate or atelectasis also possible. Chronic scarring in the right apex. Mild scattered mosaic attenuation suggests air trapping. Central airways: Patent Effusions: Trace right pleural effusion. Lymphadenopathy: None Esophagus: Unremarkable Upper abdomen: Cirrhotic liver, incompletely imaged. Musculoskeletal: No significant abnormality Review of the MIP images  confirms the above findings. IMPRESSION: The study is positive for pulmonary embolism, probably acute on chronic. There are findings suggesting pulmonary arterial hypertension. No evidence of right heart strain. Possible pulmonary infarction in the right lower lobe periphery, although this could also represent atelectasis or infection. Scarring in the right apex. Cirrhosis. These results were called by telephone at the time of interpretation on 05/26/2015 at 12:10 am to Dr. Duffy Bruce , who verbally acknowledged these results. Electronically Signed   By: Andreas Newport M.D.   On: 05/26/2015 00:15   US Renal  06/01/2015  CLINICAL DATA:  Acute renal failure. Nephrolithiasis. Chronic kidney  disease stage 3. EXAM: RENAL / URINARY TRACT ULTRASOUND COMPLETE COMPARISON:  Abdomen ultrasound on 11/26/2014 FINDINGS: Right Kidney: Length: 11.5 cm. Mild diffuse renal parenchymal thinning. Two simple appearing cysts are noted in the lower pole measuring 2.9 cm and 1.3 cm in maximum diameter. No renal masses identified. No evidence of hydronephrosis. Left Kidney: Length: 11.2 cm. Mild diffuse renal parenchymal thinning. Tiny 9 mm cyst noted in lower pole. No evidence of renal mass or hydronephrosis. Bladder: Appears normal for degree of bladder distention. IMPRESSION: Mild diffuse bilateral renal parenchymal thinning/ atrophy. No evidence of hydronephrosis. Small bilateral renal cysts.  No renal mass visualized. Electronically Signed   By: Earle Gell M.D.   On: 06/01/2015 20:54   Ct Renal Stone Study  05/25/2015  CLINICAL DATA:  Right flank pain since noon today. EXAM: CT ABDOMEN AND PELVIS WITHOUT CONTRAST TECHNIQUE: Multidetector CT imaging of the abdomen and pelvis was performed following the standard protocol without IV contrast. COMPARISON:  10/04/2013 FINDINGS: Small bilateral pleural effusions with atelectasis in the lung bases. Small pericardial effusion. Multiple bilateral intrarenal stones are demonstrated. Largest is in the left upper pole and measures about 5 mm. No hydronephrosis or hydroureter. No ureteral stones or bladder stones. No bladder wall thickening. Low-attenuation lesion in the midpole of the right kidney likely representing a cyst. No change since previous study. Changes of hepatic cirrhosis with nodular hepatic contour and enlargement of the lateral segment left lobe. Mild prominence of lymph nodes in the celiac axis and porta hepatis are likely related to cirrhosis. Spleen is mildly enlarged. Mild splenic varices. The unenhanced appearance of the gallbladder, pancreas, adrenal glands, abdominal aorta, inferior vena cava, and retroperitoneal lymph nodes is unremarkable. Small  diverticulum of the second portion of the duodenum. Stomach, small bowel, and colon are not abnormally distended. Scattered stool in the colon. No free air or free fluid in the abdomen. Pelvis: The appendix is normal. Uterus is surgically absent. No free or loculated pelvic fluid collections. No pelvic mass or lymphadenopathy. Degenerative changes in the spine. No destructive bone lesions. IMPRESSION: Multiple bilateral nonobstructing intrarenal stones. No hydronephrosis or hydroureter. No ureteral stones. Changes of hepatic cirrhosis with splenic enlargement. Small bilateral pleural effusions with infiltration or atelectasis in the lung bases. Small pericardial effusion. Electronically Signed   By: Lucienne Capers M.D.   On: 05/25/2015 21:17    Micro Results     Recent Results (from the past 240 hour(s))  Urine culture     Status: Abnormal   Collection Time: 05/25/15  8:21 PM  Result Value Ref Range Status   Specimen Description URINE, CLEAN CATCH  Final   Special Requests Normal  Final   Culture MULTIPLE SPECIES PRESENT, SUGGEST RECOLLECTION (A)  Final   Report Status 05/27/2015 FINAL  Final  MRSA PCR Screening     Status: None   Collection  Time: 05/26/15  2:00 AM  Result Value Ref Range Status   MRSA by PCR NEGATIVE NEGATIVE Final    Comment:        The GeneXpert MRSA Assay (FDA approved for NASAL specimens only), is one component of a comprehensive MRSA colonization surveillance program. It is not intended to diagnose MRSA infection nor to guide or monitor treatment for MRSA infections.   Blood Culture (routine x 2)     Status: None (Preliminary result)   Collection Time: 05/30/15 11:02 PM  Result Value Ref Range Status   Specimen Description BLOOD RIGHT ANTECUBITAL  Final   Special Requests BOTTLES DRAWN AEROBIC AND ANAEROBIC 5CC EA  Final   Culture NO GROWTH 1 DAY  Final   Report Status PENDING  Incomplete  Blood Culture (routine x 2)     Status: None (Preliminary result)     Collection Time: 05/30/15 11:02 PM  Result Value Ref Range Status   Specimen Description BLOOD RIGHT HAND  Final   Special Requests BOTTLES DRAWN AEROBIC AND ANAEROBIC 5CC EA  Final   Culture NO GROWTH 1 DAY  Final   Report Status PENDING  Incomplete       Today   Subjective:   [name] today has no headache,no chest abdominal pain,no new weakness tingling or numbness, feels much better wants to go home today.   Objective:   Blood pressure 137/77, pulse 59, temperature 97.7 F (36.5 C), temperature source Oral, resp. rate 18, height 5\' 4"  (1.626 m), weight 105.28 kg (232 lb 1.6 oz), SpO2 99 %.   Intake/Output Summary (Last 24 hours) at 06/02/15 1059 Last data filed at 06/02/15 0927  Gross per 24 hour  Intake 1908.75 ml  Output    600 ml  Net 1308.75 ml    Exam More awake, Alert, Oriented X 3, No new F.N deficits, Normal affect [place].AT,PERRAL Supple Neck,No JVD, No cervical lymphadenopathy appriciated.  Symmetrical Chest wall movement, Good air movement bilaterally, CTAB RRR,No Gallops,Rubs or new Murmurs, No Parasternal Heave +ve B.Sounds, Abd Soft, No tenderness, No organomegaly appriciated, No rebound - guarding or rigidity. No Cyanosis, Clubbing or edema, No new Rash or bruise   Data Review   CBC w Diff:  Lab Results  Component Value Date   WBC 3.6* 06/01/2015   HGB 11.3* 06/01/2015   HCT 34.9* 06/01/2015   PLT 108* 06/01/2015   LYMPHOPCT 27 05/25/2015   MONOPCT 7 05/25/2015   EOSPCT 1 05/25/2015   BASOPCT 0 05/25/2015    CMP:  Lab Results  Component Value Date   NA 142 06/02/2015   K 4.3 06/02/2015   CL 107 06/02/2015   CO2 23 06/02/2015   BUN 14 06/02/2015   CREATININE 1.27* 06/02/2015   PROT 7.1 05/25/2015   ALBUMIN 4.1 05/25/2015   BILITOT 1.8* 05/25/2015   ALKPHOS 82 05/25/2015   AST 28 05/25/2015   ALT 17 05/25/2015  .   Total Time in preparing paper work, data evaluation and todays exam - 35 minutes  Param Capri M.D on  06/02/2015 at [place]  [phone]

## 2015-06-02 NOTE — Evaluation (Signed)
Physical Therapy Evaluation Patient Details Name: [name] MRN: [id number] DOB: 1955/04/28 Today's Date: 06/02/2015   History of Present Illness  [name] is a 60 y.o. female with medical history significant of cirrhosis, recent PE, admitted to our service from 5/2 to 5/3. Discharged on Xarelto but she wasn't able to get this filled yet due to insurance issues. She returns to the ED with worsening SOB and chest pain. Pneumonia in the setting of pulmonary infarction due to recent PE.  Clinical Impression   Patient evaluated by Physical Therapy with no further acute PT needs identified. All education has been completed and the patient has no further questions.  See below for any follow-up Physical Therapy or equipment needs. PT is signing off. Thank you for this referral.     Follow Up Recommendations No PT follow up;Supervision - Intermittent    Equipment Recommendations  None recommended by PT    Recommendations for Other Services       Precautions / Restrictions        Mobility  Bed Mobility                  Transfers Overall transfer level: Modified independent Equipment used: None Transfers: Sit to/from Stand Sit to Stand: Supervision         General transfer comment: Noted decr control with stand to sit  Ambulation/Gait Ambulation/Gait assistance: Supervision;Modified independent (Device/Increase time) Ambulation Distance (Feet): 300 Feet Assistive device: None Gait Pattern/deviations: Step-through pattern;Decreased stride length     General Gait Details: Cues to self-monitor for activity tolerance; Ambulated on Room Air and O2 sats remained greater than or equal to 93%  Stairs Stairs: Yes Stairs assistance: Min guard Stair Management: No rails;Alternating pattern;Forwards Number of Stairs: 2 General stair comments: Managing steps well  Wheelchair Mobility    Modified Rankin (Stroke Patients Only)       Balance              Standing balance-Leahy Scale: Good                               Pertinent Vitals/Pain Pain Assessment: Faces Faces Pain Scale: Hurts little more Pain Location: R back and flank; reports LE pain as well Pain Descriptors / Indicators: Aching Pain Intervention(s): Monitored during session    Home Living Family/patient expects to be discharged to:: Private residence Living Arrangements: Children;Other relatives Available Help at Discharge: Family;Available PRN/intermittently Type of Home: House Home Access: Stairs to enter Entrance Stairs-Rails: None Entrance Stairs-Number of Steps: 2 Home Layout: One level Home Equipment: None      Prior Function Level of Independence: Independent         Comments: Pt denies h/o falls.  She does not drive      Hand Dominance   Dominant Hand: Right    Extremity/Trunk Assessment   Upper Extremity Assessment: Overall WFL for tasks assessed           Lower Extremity Assessment: Overall WFL for tasks assessed      Cervical / Trunk Assessment: Normal  Communication   Communication: No difficulties  Cognition Arousal/Alertness: Awake/alert Behavior During Therapy: WFL for tasks assessed/performed Overall Cognitive Status: Within Functional Limits for tasks assessed                      General Comments General comments (skin integrity, edema, etc.): DOE 2/4; overall managing well  Exercises        Assessment/Plan    PT Assessment Patent does not need any further PT services  PT Diagnosis Acute pain   PT Problem List    PT Treatment Interventions     PT Goals (Current goals can be found in the Care Plan section) Acute Rehab PT Goals Patient Stated Goal: to go home and see her grandchildren PT Goal Formulation: All assessment and education complete, DC therapy    Frequency     Barriers to discharge        Co-evaluation               End of Session   Activity Tolerance: Patient  tolerated treatment well Patient left: in chair;with call bell/phone within reach Nurse Communication: Mobility status    Functional Assessment Tool Used: Clinical Judgement Functional Limitation: Mobility: Walking and moving around Mobility: Walking and Moving Around Current Status [id number]): 0 percent impaired, limited or restricted Mobility: Walking and Moving Around Goal Status [id number]): 0 percent impaired, limited or restricted Mobility: Walking and Moving Around Discharge Status [phone]): At least 1 percent but less than 20 percent impaired, limited or restricted    Time: 1046-1100 PT Time Calculation (min) (ACUTE ONLY): 14 min   Charges:   PT Evaluation $PT Eval Low Complexity: 1 Procedure     PT G Codes:   PT G-Codes **NOT FOR INPATIENT CLASS** Functional Assessment Tool Used: Clinical Judgement Functional Limitation: Mobility: Walking and moving around Mobility: Walking and Moving Around Current Status [id number]): 0 percent impaired, limited or restricted Mobility: Walking and Moving Around Goal Status [id number]): 0 percent impaired, limited or restricted Mobility: Walking and Moving Around Discharge Status [phone]): At least 1 percent but less than 20 percent impaired, limited or restricted    Roney Marion [facility] 06/02/2015, 11:17 AM  Roney Marion, [place] Pager [phone] Office [phone]

## 2015-06-05 LAB — CULTURE, BLOOD (ROUTINE X 2)
CULTURE: NO GROWTH
CULTURE: NO GROWTH

## 2015-06-11 ENCOUNTER — Telehealth (HOSPITAL_BASED_OUTPATIENT_CLINIC_OR_DEPARTMENT_OTHER): Payer: Self-pay

## 2015-06-26 DIAGNOSIS — R760 Raised antibody titer: Secondary | ICD-10-CM | POA: Insufficient documentation

## 2015-06-26 DIAGNOSIS — I2699 Other pulmonary embolism without acute cor pulmonale: Secondary | ICD-10-CM | POA: Insufficient documentation

## 2015-06-28 ENCOUNTER — Inpatient Hospital Stay: Admission: RE | Admit: 2015-06-28 | Payer: Self-pay | Source: Ambulatory Visit

## 2015-07-15 ENCOUNTER — Inpatient Hospital Stay: Admission: RE | Admit: 2015-07-15 | Payer: Self-pay | Source: Ambulatory Visit

## 2015-07-22 ENCOUNTER — Other Ambulatory Visit: Payer: Self-pay | Admitting: Family Medicine

## 2015-07-22 DIAGNOSIS — Z1231 Encounter for screening mammogram for malignant neoplasm of breast: Secondary | ICD-10-CM

## 2015-08-11 ENCOUNTER — Ambulatory Visit: Payer: Self-pay

## 2015-08-13 ENCOUNTER — Inpatient Hospital Stay: Admission: RE | Admit: 2015-08-13 | Payer: Self-pay | Source: Ambulatory Visit

## 2015-08-17 ENCOUNTER — Ambulatory Visit: Payer: Self-pay

## 2015-09-10 IMAGING — CR DG CHEST 2V
2 series · 2 of 2 positions shown · non-contrast
Comparison: 12/21/2012

CLINICAL DATA: Overdose, chest pain, history hypertension, COPD,
smoking, cirrhosis

EXAM:
CHEST  2 VIEW

[w chest pa]
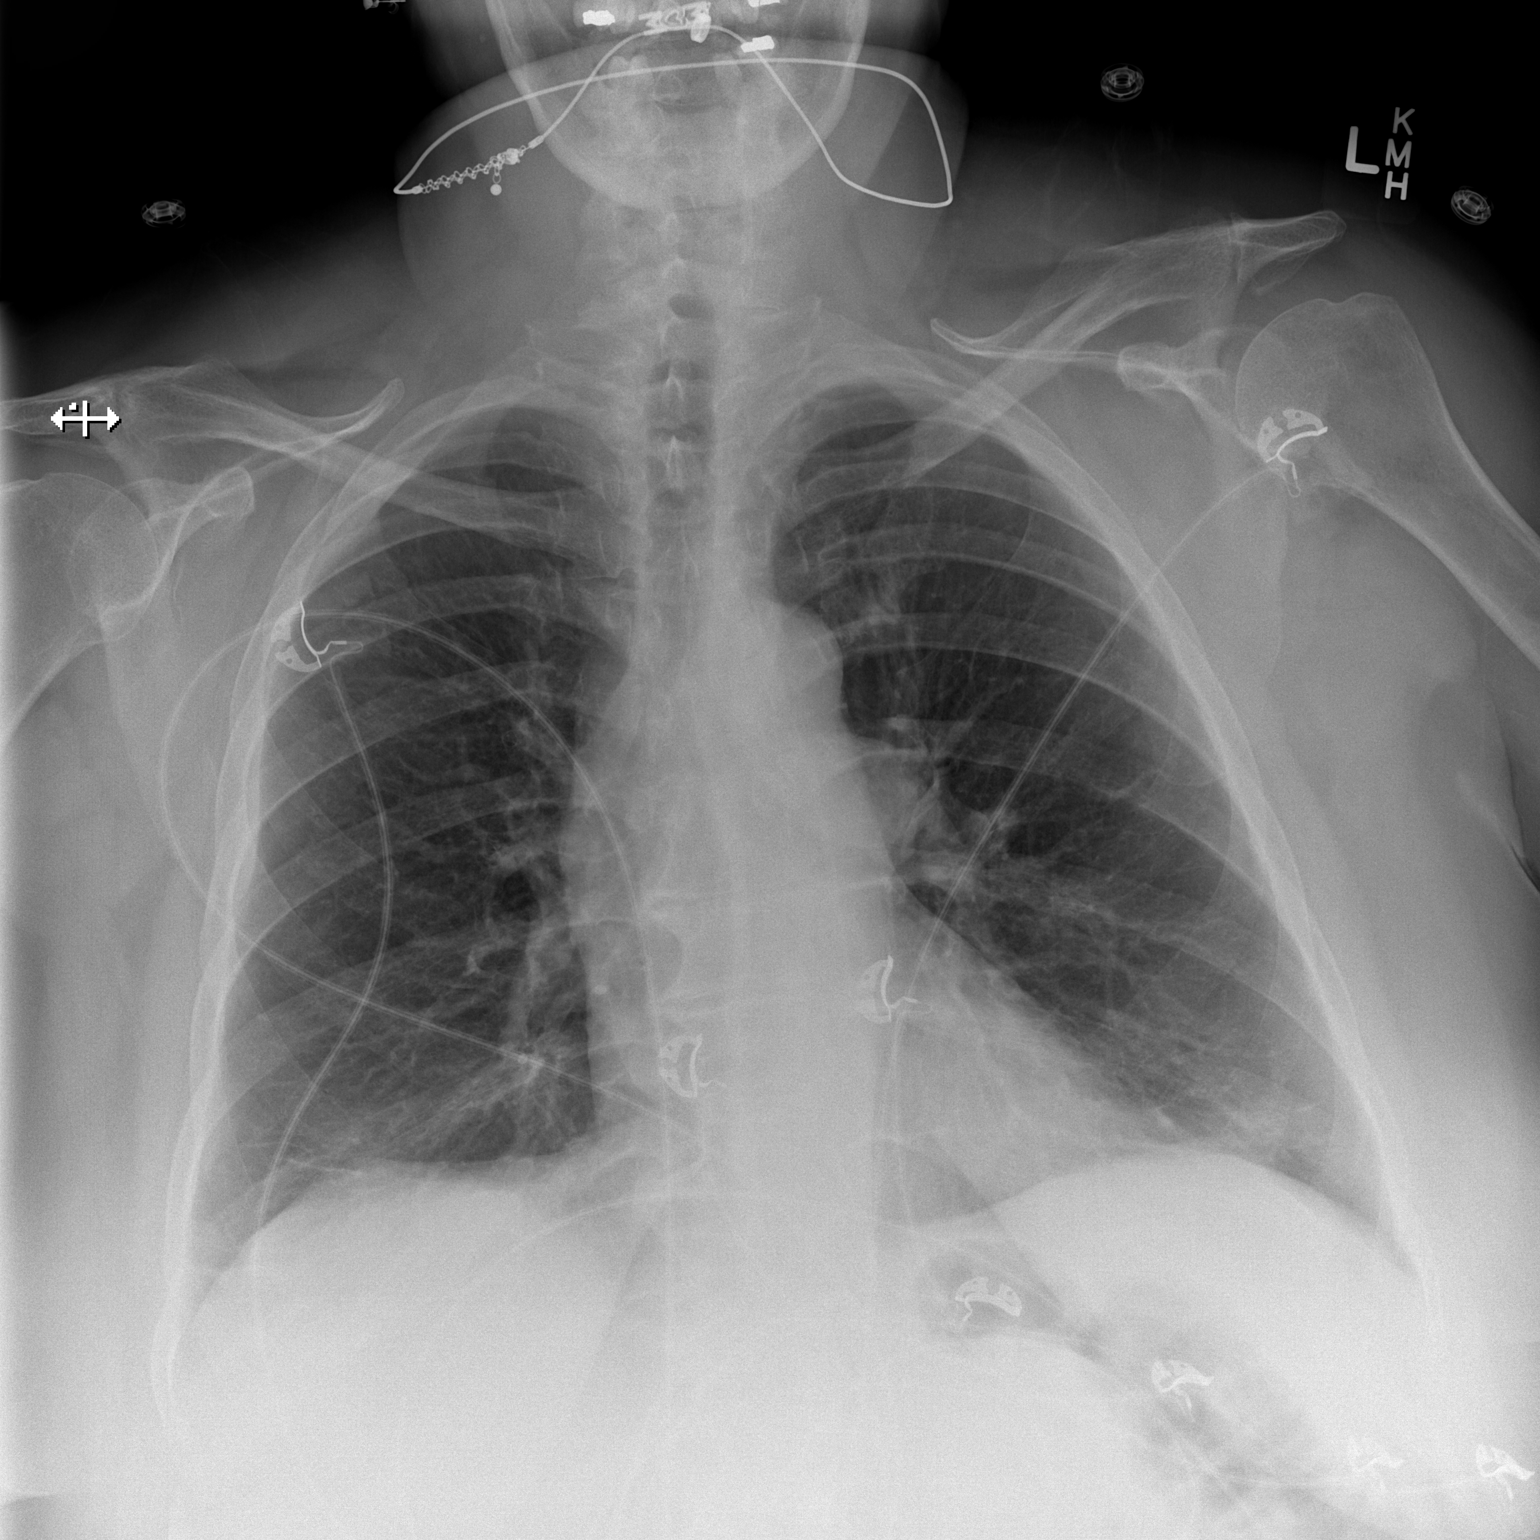

[w chest lat]
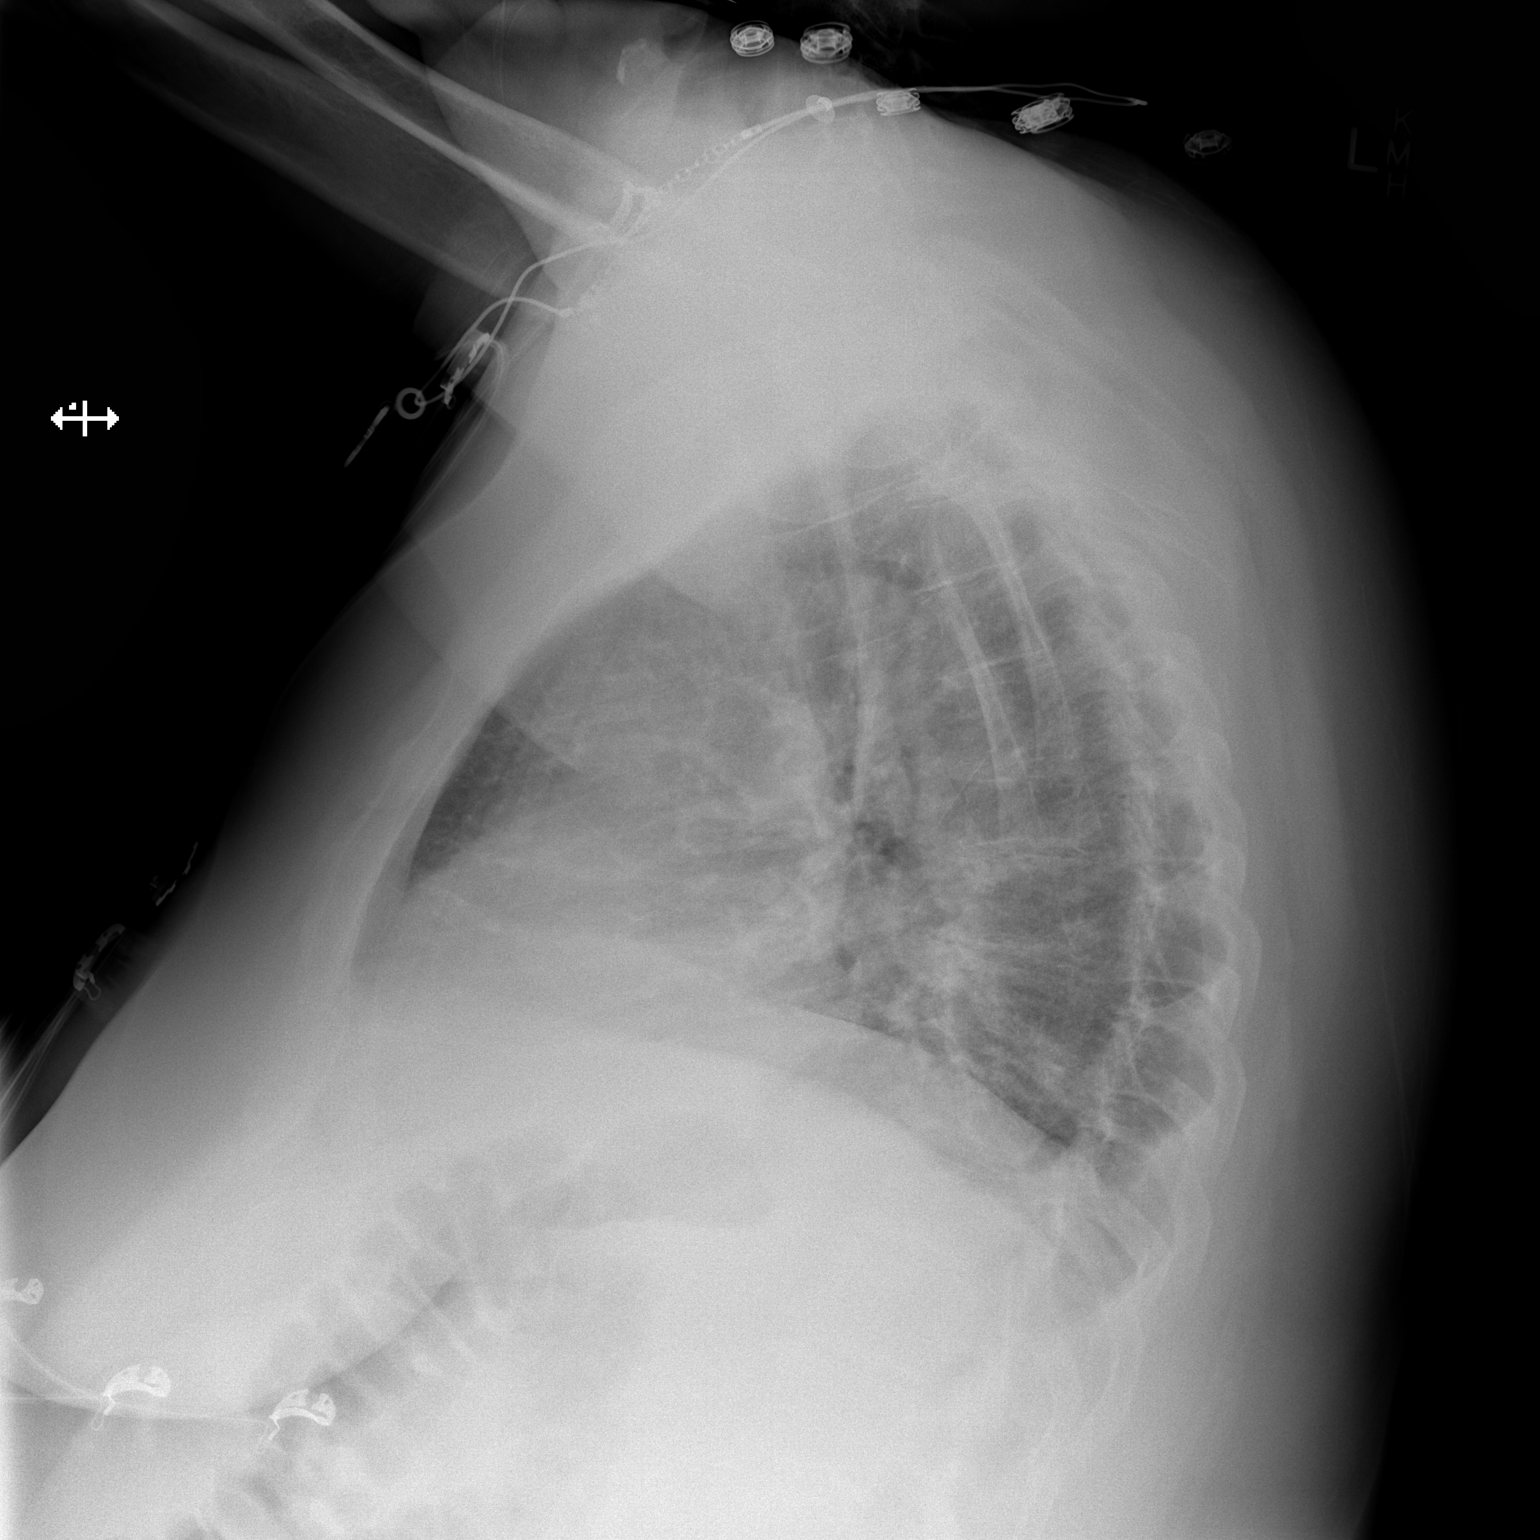

[2 of 2 positions shown; findings below may reference images not displayed]

FINDINGS: Normal heart size, mediastinal contours, and pulmonary vascularity.

Minimal bibasilar atelectasis.

Lungs otherwise clear.

No pleural effusion or pneumothorax.

No acute osseous findings.
IMPRESSION: Minimal bibasilar atelectasis.

## 2015-09-13 ENCOUNTER — Ambulatory Visit
Admission: RE | Admit: 2015-09-13 | Discharge: 2015-09-13 | Disposition: A | Discharge status: Home / Self Care | Payer: Medicare Other | Source: Ambulatory Visit | Attending: Physician Assistant | Admitting: Physician Assistant

## 2015-09-13 ENCOUNTER — Other Ambulatory Visit: Payer: Self-pay | Admitting: Physician Assistant

## 2015-09-13 DIAGNOSIS — M545 Low back pain: Secondary | ICD-10-CM

## 2015-09-26 DIAGNOSIS — R8290 Unspecified abnormal findings in urine: Secondary | ICD-10-CM | POA: Insufficient documentation

## 2015-09-30 ENCOUNTER — Ambulatory Visit: Payer: Self-pay

## 2015-10-07 ENCOUNTER — Ambulatory Visit: Payer: Self-pay

## 2015-10-07 ENCOUNTER — Other Ambulatory Visit: Payer: Self-pay

## 2015-10-20 ENCOUNTER — Other Ambulatory Visit (HOSPITAL_COMMUNITY): Payer: Self-pay | Admitting: Gastroenterology

## 2015-10-20 ENCOUNTER — Other Ambulatory Visit: Payer: Self-pay | Admitting: Gastroenterology

## 2015-10-20 DIAGNOSIS — Z1289 Encounter for screening for malignant neoplasm of other sites: Secondary | ICD-10-CM

## 2015-10-20 DIAGNOSIS — K7460 Unspecified cirrhosis of liver: Secondary | ICD-10-CM

## 2015-11-08 ENCOUNTER — Ambulatory Visit
Admission: RE | Admit: 2015-11-08 | Discharge: 2015-11-08 | Disposition: A | Discharge status: Home / Self Care | Payer: Medicare Other | Source: Ambulatory Visit | Attending: Family Medicine | Admitting: Family Medicine

## 2015-11-08 ENCOUNTER — Ambulatory Visit (HOSPITAL_COMMUNITY): Payer: Medicare Other

## 2015-11-08 DIAGNOSIS — Z1231 Encounter for screening mammogram for malignant neoplasm of breast: Secondary | ICD-10-CM

## 2015-11-08 DIAGNOSIS — E2839 Other primary ovarian failure: Secondary | ICD-10-CM

## 2015-11-11 ENCOUNTER — Emergency Department (HOSPITAL_COMMUNITY)
Admission: EM | Admit: 2015-11-11 | Discharge: 2015-11-12 | Disposition: A | Discharge status: Home / Self Care | Payer: Medicare Other | Attending: Emergency Medicine | Admitting: Emergency Medicine

## 2015-11-11 ENCOUNTER — Encounter (HOSPITAL_COMMUNITY): Payer: Self-pay | Admitting: *Deleted

## 2015-11-11 ENCOUNTER — Emergency Department (HOSPITAL_COMMUNITY): Payer: Medicare Other

## 2015-11-11 DIAGNOSIS — M25512 Pain in left shoulder: Secondary | ICD-10-CM | POA: Diagnosis present

## 2015-11-11 DIAGNOSIS — M19012 Primary osteoarthritis, left shoulder: Secondary | ICD-10-CM | POA: Diagnosis not present

## 2015-11-11 DIAGNOSIS — M19019 Primary osteoarthritis, unspecified shoulder: Secondary | ICD-10-CM

## 2015-11-11 DIAGNOSIS — Z87891 Personal history of nicotine dependence: Secondary | ICD-10-CM | POA: Insufficient documentation

## 2015-11-11 DIAGNOSIS — Z7901 Long term (current) use of anticoagulants: Secondary | ICD-10-CM | POA: Insufficient documentation

## 2015-11-11 DIAGNOSIS — J449 Chronic obstructive pulmonary disease, unspecified: Secondary | ICD-10-CM | POA: Diagnosis not present

## 2015-11-11 DIAGNOSIS — Z79899 Other long term (current) drug therapy: Secondary | ICD-10-CM | POA: Diagnosis not present

## 2015-11-11 DIAGNOSIS — N183 Chronic kidney disease, stage 3 (moderate): Secondary | ICD-10-CM | POA: Insufficient documentation

## 2015-11-11 DIAGNOSIS — I129 Hypertensive chronic kidney disease with stage 1 through stage 4 chronic kidney disease, or unspecified chronic kidney disease: Secondary | ICD-10-CM | POA: Diagnosis not present

## 2015-11-11 MED ORDER — TRAMADOL HCL 50 MG PO TABS
50.0000 mg | ORAL_TABLET | Freq: Once | ORAL | Status: AC
  Administered 2015-11-11: 50 mg via ORAL
  Filled 2015-11-11: qty 1

## 2015-11-11 NOTE — ED Provider Notes (Signed)
[facility] DEPT Provider Note   CSN: [id number] Arrival date & time: 11/11/15  2004     History   Chief Complaint Chief Complaint  Patient presents with  . Shoulder Pain    HPI [name] is a 60 y.o. female.  Patient is 60 yo F with history of arthritis, presenting with left shoulder pain x 3 months. States pain has been constant, described as "throbbing," and worse with overhead movements. Also, has had difficulty sleeping with outstretched arm. Denies any recent trauma or prior injury to left shoulder. Has not tried anything to relieve pain. Denies fever, chills, or warmth at left shoulder. Denies any pain to right shoulder.      Past Medical History:  Diagnosis Date  . Agoraphobia with panic attacks   . Altered mental state 10/26/2013  . Ankle fracture, left 11/27/2010   S/p ORIF 11/2   . Anxiety   . Arthritis    "knees; lower back" (10/30/2013)  . Bipolar disorder ([place])   . Cavitary pneumonia   . Cirrhosis ([place])   . CKD (chronic kidney disease), stage III   . COPD (chronic obstructive pulmonary disease) ([place])   . Depression   . DVT (deep venous thrombosis) ([place]) 09/2013   RLE  . GERD (gastroesophageal reflux disease)   . Heart murmur   . Hepatitis B   . History of blood transfusion    "due to excessive blood loss before hysterectomy"  . History of urinary tract infection   . Hypertension    currently on no medication   . Kidney stones   . Pneumonia    "4 times in the past year" (10/30/2013)  . Pulmonary emboli ([place]) 09/2013  . Shortness of breath dyspnea    increased exertion;exercise  . Urinary frequency   . Urinary incontinence     Patient Active Problem List   Diagnosis Date Noted  . Right lower lobe pneumonia ([place]) 05/31/2015  . Right upper lobe pneumonia ([place]) 05/31/2015  . CAP (community acquired pneumonia) 05/31/2015  . Thrombocytopenia ([place]) 05/31/2015  . UTI (lower urinary tract infection) 05/26/2015  . Right flank pain   .  Incarcerated incisional hernia s/p lap repair w mesh 12/10/2014 12/10/2014  . Cavitary lesion of lung 12/08/2013  . DVT (deep venous thrombosis) ([place]) 10/31/2013  . PE (pulmonary embolism) 10/26/2013  . Kidney stone 10/04/2013  . Tobacco abuse 10/04/2013  . COPD (chronic obstructive pulmonary disease) ([place])   . Unspecified constipation 12/23/2012  . Steroid-induced hyperglycemia 12/22/2012  . CKD (chronic kidney disease), stage III 12/21/2012  . Sepsis ([place]) 12/21/2012  . Cocaine abuse 11/27/2010  . Obesity 09/01/2008  . Hepatitis B virus infection 08/31/2008  . Acute hepatitis C virus infection 08/31/2008  . Iron deficiency anemia 08/31/2008  . Anxiety state 08/31/2008  . Essential hypertension 08/31/2008  . Coronary atherosclerosis 08/31/2008  . Hepatic cirrhosis ([place]) 08/31/2008    Past Surgical History:  Procedure Laterality Date  . ABDOMINAL HYSTERECTOMY    . ANKLE FRACTURE SURGERY Left   . CARPAL TUNNEL RELEASE Left   . DILATION AND CURETTAGE OF UTERUS  X 2  . FRACTURE SURGERY    . HAND SURGERY     1980's/left hand   . INSERTION OF MESH N/A 12/10/2014   Procedure: INSERTION OF MESH;  Surgeon: Michael Boston, MD;  Location: [facility] ORS;  Service: General;  Laterality: N/A;  . LAPAROSCOPIC ASSISTED VENTRAL HERNIA REPAIR N/A 12/10/2014   Procedure: LAPAROSCOPIC ASSISTED REPAIR OF INCARCERATED UMBILICAL HERNIA ;  Surgeon: Michael Boston, MD;  Location: [facility];  Service: General;  Laterality: N/A;  . TUBAL LIGATION      OB History    No data available       Home Medications    Prior to Admission medications   Medication Sig Start Date End Date Taking? Authorizing Provider  ALPRAZolam Duanne Moron) 1 MG tablet Take 0.5 tablets (0.5 mg total) by mouth 2 (two) times daily as needed for anxiety. Patient taking differently: Take 1 mg by mouth 4 (four) times daily.  06/02/15  Yes Albertine Patricia, MD  budesonide-formoterol (SYMBICORT) 80-4.5 MCG/ACT inhaler Inhale 2 puffs into the  lungs 2 (two) times daily. 05/27/15  Yes Mir Marry Guan, MD  citalopram (CELEXA) 20 MG tablet Take 40 mg by mouth at bedtime.    Yes Historical Provider, MD  doxepin (SINEQUAN) 25 MG capsule Take 50 capsules by mouth at bedtime.  05/27/15  Yes Historical Provider, MD  EMBEDA 30-1.2 MG CPCR Take 1 tablet by mouth daily. 11/08/15  Yes Historical Provider, MD  entecavir (BARACLUDE) 0.5 MG tablet Take 0.5 mg by mouth every other day.    Yes Historical Provider, MD  FLUARIX QUADRIVALENT 0.5 ML injection  10/02/15  Yes Historical Provider, MD  Iloperidone (FANAPT) 6 MG TABS Take 12 mg by mouth every morning.    Yes Historical Provider, MD  Multiple Vitamin (MULTIVITAMIN WITH MINERALS) TABS tablet Take 1 tablet by mouth daily.   Yes Historical Provider, MD  oxyCODONE-acetaminophen (PERCOCET) 7.5-325 MG tablet Take 1 tablet by mouth 4 (four) times daily.   Yes Historical Provider, MD  pantoprazole (PROTONIX) 40 MG tablet Take 40 mg by mouth daily. 12/23/13  Yes Historical Provider, MD  PROAIR HFA 108 (90 Base) MCG/ACT inhaler INHALE 2 PUFFS INTO THE LUNGS EVERY 6 HOURS AS NEEDED FOR WHEEZING OR SHORTNESS OF BREATH. 02/01/15  Yes Rigoberto Noel, MD  rivaroxaban (XARELTO) 20 MG TABS tablet Take 20 mg by mouth daily.   Yes Historical Provider, MD  spironolactone (ALDACTONE) 50 MG tablet Take 100 mg by mouth every morning. 03/10/15  Yes Historical Provider, MD  temazepam (RESTORIL) 30 MG capsule Take 30 mg by mouth at bedtime. 09/14/14  Yes Historical Provider, MD  valACYclovir (VALTREX) 500 MG tablet Take 500 mg by mouth 2 (two) times daily. 11/10/15 11/17/15 Yes Historical Provider, MD  amoxicillin-clavulanate (AUGMENTIN) 875-125 MG tablet Take 1 tablet by mouth every 12 (twelve) hours. Patient not taking: Reported on 11/11/2015 06/02/15   Silver Huguenin Elgergawy, MD  oxyCODONE (OXY IR/ROXICODONE) 5 MG immediate release tablet Take 1 tablet (5 mg total) by mouth every 6 (six) hours as needed for severe pain or  breakthrough pain. Patient not taking: Reported on 11/11/2015 06/02/15   Albertine Patricia, MD  Rivaroxaban 15 & 20 MG TBPK Take as directed on package: Start with one 15mg  tablet by mouth twice a day with food. On Day 22, switch to one 20mg  tablet once a day with food. Patient not taking: Reported on 11/11/2015 05/28/15   Tammy Sours, MD  Spacer/Aero-Holding Chambers (AEROCHAMBER MV) inhaler Use as instructed Patient not taking: Reported on 11/11/2015 10/08/14   Rigoberto Noel, MD    Family History Family History  Problem Relation Age of Onset  . Stroke Mother   . Lung cancer Father     Social History Social History  Substance Use Topics  . Smoking status: Former Smoker    Packs/day: 1.00    Years: 35.00    Types:  Cigarettes    Quit date: 10/26/2013  . Smokeless tobacco: Never Used  . Alcohol use No     Comment: "stopped drinking  in 2004"     Allergies   Heparin; Amitriptyline hcl; Gabapentin; Cymbalta [duloxetine hcl]; Elavil [amitriptyline]; and Neomycin-bacitracin zn-polymyx   Review of Systems Review of Systems  Constitutional: Negative for chills and fever.  Musculoskeletal: Positive for arthralgias (left shoulder pain). Negative for joint swelling.  Skin: Negative for color change and rash.  Neurological: Negative for weakness and numbness.     Physical Exam Updated Vital Signs BP 138/90 (BP Location: Left Arm)   Pulse 70   Temp 98.3 F (36.8 C)   Resp 20   Wt 110.7 kg   SpO2 100%   BMI 41.88 kg/m   Physical Exam  Constitutional: She appears well-developed and well-nourished. No distress.  HENT:  Head: Normocephalic and atraumatic.  Mouth/Throat: Oropharynx is clear and moist.  Eyes: Conjunctivae are normal.  Neck: Normal range of motion.  Cardiovascular: Normal rate.   Pulmonary/Chest: Effort normal. No respiratory distress.  Musculoskeletal: Normal range of motion.  Left shoulder with FROM without crepitus or deformity. Mild bony TTP, but no  muscular TTP or spasms noted. No swelling/effusion, bruising, erythema, or warmth. Negative impingement sign, negative drop arm test. Strength and sensation grossly intact in all extremities, distal pulses intact.  Neurological: She is alert.  Skin: Skin is warm and dry.  Psychiatric: She has a normal mood and affect.  Nursing note and vitals reviewed.    ED Treatments / Results  Labs (all labs ordered are listed, but only abnormal results are displayed) Labs Reviewed - No data to display  EKG  EKG Interpretation None       Radiology No results found.  Procedures Procedures (including critical care time)  Medications Ordered in ED Medications  traMADol (ULTRAM) tablet 50 mg (not administered)     Initial Impression / Assessment and Plan / ED Course  I have reviewed the triage vital signs and the nursing notes.  Pertinent labs & imaging results that were available during my care of the patient were reviewed by me and considered in my medical decision making (see chart for details).  Clinical Course   Patient is 60 yo F presenting with left shoulder pain x 3 months. Denies any recent trauma. Patient is afebrile, and no warmth noted at shoulder concerning for septic joint. Left shoulder FROM, mildly TTP, negative impingement, and negative drop arm. Given findings, no concerns for acute fracture, dislocation, bursitis, or rotator cuff tear. Given PO Ultram for pain. Personally reviewed X-ray left shoulder, which shows osteoarthritis of glenohumeral and AC joints, but no acute bony abnormality. Given prescription for ibuprofen and contact information to f/u with prior orthopedist, Dr. Jean Rosenthal. Patient agreeable to plan, and return precautions discussed.  Final Clinical Impressions(s) / ED Diagnoses   Final diagnoses:  Arthritis of shoulder    New Prescriptions Discharge Medication List as of 11/12/2015 12:27 AM    START taking these medications   Details    ibuprofen (ADVIL,MOTRIN) 800 MG tablet Take 1 tablet (800 mg total) by mouth 3 (three) times daily., Starting Fri 11/12/2015, Print         Brandee Markin F de Bennett II, [place] 11/13/15 1805    Davonna Belling, MD 11/14/15 0005

## 2015-11-11 NOTE — ED Triage Notes (Signed)
Pt reports atraumatic left shoulder pain for the past 3 months. Pt reports that the pain is worse when pt attempts to lift her arm to shoulder height. Pt a/o x 4 and ambulatory.

## 2015-11-12 ENCOUNTER — Ambulatory Visit (HOSPITAL_COMMUNITY)
Admission: RE | Admit: 2015-11-12 | Discharge: 2015-11-12 | Disposition: A | Discharge status: Home / Self Care | Payer: Medicare Other | Source: Ambulatory Visit | Attending: Gastroenterology | Admitting: Gastroenterology

## 2015-11-12 DIAGNOSIS — B181 Chronic viral hepatitis B without delta-agent: Secondary | ICD-10-CM | POA: Insufficient documentation

## 2015-11-12 DIAGNOSIS — K7460 Unspecified cirrhosis of liver: Secondary | ICD-10-CM

## 2015-11-12 DIAGNOSIS — R161 Splenomegaly, not elsewhere classified: Secondary | ICD-10-CM | POA: Insufficient documentation

## 2015-11-12 DIAGNOSIS — Z1289 Encounter for screening for malignant neoplasm of other sites: Secondary | ICD-10-CM

## 2015-11-12 DIAGNOSIS — M19012 Primary osteoarthritis, left shoulder: Secondary | ICD-10-CM | POA: Diagnosis not present

## 2015-11-12 MED ORDER — IBUPROFEN 800 MG PO TABS: 800.0000 mg | ORAL_TABLET | Freq: Three times a day (TID) | ORAL | 0 refills | Status: DC

## 2015-11-29 ENCOUNTER — Ambulatory Visit (INDEPENDENT_AMBULATORY_CARE_PROVIDER_SITE_OTHER): Payer: Medicare Other | Admitting: Orthopaedic Surgery

## 2015-11-29 DIAGNOSIS — M25512 Pain in left shoulder: Secondary | ICD-10-CM

## 2015-11-29 MED ORDER — LIDOCAINE HCL 1 % IJ SOLN
3.0000 mL | INTRAMUSCULAR | Status: AC | PRN
  Administered 2015-11-29: 3 mL

## 2015-11-29 MED ORDER — METHYLPREDNISOLONE ACETATE 40 MG/ML IJ SUSP
40.0000 mg | INTRAMUSCULAR | Status: AC | PRN
  Administered 2015-11-29: 40 mg via INTRA_ARTICULAR

## 2015-11-29 NOTE — Progress Notes (Signed)
Office Visit Note   Patient: [name]           Date of Birth: 01-23-1956           MRN: [id number] Visit Date: 11/29/2015              Requested by: Luetta Nutting, DO [address] [place], [place] [address] PCP: Luetta Nutting, DO   Assessment & Plan: Visit Diagnoses:  1. Acute pain of left shoulder     Plan: He tolerated the injection in her left shoulder subacromial space easily. I like see her back in about 6 weeks to see how she is doing overall. She is not a diabetic and rotator cuff feels great side would consider a repeat injection then to try to finish this off if needed Follow.-Up Instructions: Return in about 6 weeks (around 01/10/2016).   Orders:  Orders Placed This Encounter  Procedures  . Large Joint Injection/Arthrocentesis   No orders of the defined types were placed in this encounter.     Procedures: Large Joint Inj Date/Time: 11/29/2015 4:37 PM Performed by: Mcarthur Rossetti Authorized by: Mcarthur Rossetti   Location:  Shoulder Site:  L subacromial bursa Approach:  Posterior Ultrasound Guidance: No   Fluoroscopic Guidance: No   Arthrogram: No Medications:  3 mL lidocaine 1 %; 40 mg methylPREDNISolone acetate 40 MG/ML     Clinical Data: No additional findings.   Subjective: Chief Complaint  Patient presents with  . Left Shoulder - Pain    No known injury. Pain x4-6 months. Went to ER due to pain, they told her bad arthritis with bone spurs. Taking oxycodone 7.5mg --patient in pain mgmt   Her shoulder is been hurting her shoulder for a while now. Hurts mainly with overhead activities and reaching behind her. It is been waking her up at night. She has not had any type of injection before and she is definitely interested in this. She is in chronic pain management as well. HPI  Review of Systems   Objective: Vital Signs: There were no vitals taken for this visit.  Physical Exam  Ortho Exam Examination of her left  shoulder shows full range of motion with no grinding in the glenohumeral joint. Her biggest limitation is internal rotation with adduction. She still shows good strength of rotator cuff. She does have positive Neer and Hawkins signs. Specialty Comments:  No specialty comments available.  Imaging: No results found. X-rays on the Bolingbroke system of her right shoulder including AP outlet and axillary showed decrease in her subacromial outlet and before meals joint arthritis. She her shoulder is well located. There is a small bone spur at the inferior aspect of her humeral head but the glenohumeral joint itself looks good.  PMFS History: Patient Active Problem List   Diagnosis Date Noted  . Right lower lobe pneumonia ([place]) 05/31/2015  . Right upper lobe pneumonia ([place]) 05/31/2015  . CAP (community acquired pneumonia) 05/31/2015  . Thrombocytopenia ([place]) 05/31/2015  . UTI (lower urinary tract infection) 05/26/2015  . Right flank pain   . Incarcerated incisional hernia s/p lap repair w mesh 12/10/2014 12/10/2014  . Cavitary lesion of lung 12/08/2013  . DVT (deep venous thrombosis) ([place]) 10/31/2013  . PE (pulmonary embolism) 10/26/2013  . Kidney stone 10/04/2013  . Tobacco abuse 10/04/2013  . COPD (chronic obstructive pulmonary disease) ([place])   . Unspecified constipation 12/23/2012  . Steroid-induced hyperglycemia 12/22/2012  . CKD (chronic kidney disease), stage III 12/21/2012  .  Sepsis ([place]) 12/21/2012  . Cocaine abuse 11/27/2010  . Obesity 09/01/2008  . Hepatitis B virus infection 08/31/2008  . Acute hepatitis C virus infection 08/31/2008  . Iron deficiency anemia 08/31/2008  . Anxiety state 08/31/2008  . Essential hypertension 08/31/2008  . Coronary atherosclerosis 08/31/2008  . Hepatic cirrhosis ([place]) 08/31/2008   Past Medical History:  Diagnosis Date  . Agoraphobia with panic attacks   . Altered mental state 10/26/2013  . Ankle fracture, left 11/27/2010   S/p ORIF 11/2   .  Anxiety   . Arthritis    "knees; lower back" (10/30/2013)  . Bipolar disorder ([place])   . Cavitary pneumonia   . Cirrhosis ([place])   . CKD (chronic kidney disease), stage III   . COPD (chronic obstructive pulmonary disease) ([place])   . Depression   . DVT (deep venous thrombosis) ([place]) 09/2013   RLE  . GERD (gastroesophageal reflux disease)   . Heart murmur   . Hepatitis B   . History of blood transfusion    "due to excessive blood loss before hysterectomy"  . History of urinary tract infection   . Hypertension    currently on no medication   . Kidney stones   . Pneumonia    "4 times in the past year" (10/30/2013)  . Pulmonary emboli ([place]) 09/2013  . Shortness of breath dyspnea    increased exertion;exercise  . Urinary frequency   . Urinary incontinence     Family History  Problem Relation Age of Onset  . Stroke Mother   . Lung cancer Father     Past Surgical History:  Procedure Laterality Date  . ABDOMINAL HYSTERECTOMY    . ANKLE FRACTURE SURGERY Left   . CARPAL TUNNEL RELEASE Left   . DILATION AND CURETTAGE OF UTERUS  X 2  . FRACTURE SURGERY    . HAND SURGERY     1980's/left hand   . INSERTION OF MESH N/A 12/10/2014   Procedure: INSERTION OF MESH;  Surgeon: Michael Boston, MD;  Location: [facility];  Service: General;  Laterality: N/A;  . LAPAROSCOPIC ASSISTED VENTRAL HERNIA REPAIR N/A 12/10/2014   Procedure: LAPAROSCOPIC ASSISTED REPAIR OF INCARCERATED UMBILICAL HERNIA ;  Surgeon: Michael Boston, MD;  Location: [facility];  Service: General;  Laterality: N/A;  . TUBAL LIGATION     Social History   Occupational History  . Not on file.   Social History Main Topics  . Smoking status: Former Smoker    Packs/day: 1.00    Years: 35.00    Types: Cigarettes    Quit date: 10/26/2013  . Smokeless tobacco: Never Used  . Alcohol use No     Comment: "stopped drinking  in 2004"  . Drug use: No     Comment: 10/30/2013 "last crack in 2014; last marijuana in ~ 1990" drug free for last 3 years    . Sexual activity: No

## 2016-01-01 ENCOUNTER — Emergency Department (HOSPITAL_COMMUNITY)
Admission: EM | Admit: 2016-01-01 | Discharge: 2016-01-01 | Disposition: A | Discharge status: Home / Self Care | Payer: Medicare Other | Attending: Emergency Medicine | Admitting: Emergency Medicine

## 2016-01-01 ENCOUNTER — Emergency Department (HOSPITAL_COMMUNITY): Payer: Medicare Other

## 2016-01-01 ENCOUNTER — Encounter (HOSPITAL_COMMUNITY): Payer: Self-pay | Admitting: Emergency Medicine

## 2016-01-01 DIAGNOSIS — Z87891 Personal history of nicotine dependence: Secondary | ICD-10-CM | POA: Insufficient documentation

## 2016-01-01 DIAGNOSIS — I129 Hypertensive chronic kidney disease with stage 1 through stage 4 chronic kidney disease, or unspecified chronic kidney disease: Secondary | ICD-10-CM | POA: Insufficient documentation

## 2016-01-01 DIAGNOSIS — R079 Chest pain, unspecified: Secondary | ICD-10-CM | POA: Diagnosis present

## 2016-01-01 DIAGNOSIS — R0600 Dyspnea, unspecified: Secondary | ICD-10-CM | POA: Diagnosis not present

## 2016-01-01 DIAGNOSIS — J449 Chronic obstructive pulmonary disease, unspecified: Secondary | ICD-10-CM | POA: Insufficient documentation

## 2016-01-01 DIAGNOSIS — N183 Chronic kidney disease, stage 3 (moderate): Secondary | ICD-10-CM | POA: Insufficient documentation

## 2016-01-01 LAB — CBC
HCT: 44.6 % (ref 36.0–46.0)
HEMOGLOBIN: 15.1 g/dL — AB (ref 12.0–15.0)
MCH: 31.6 pg (ref 26.0–34.0)
MCHC: 33.9 g/dL (ref 30.0–36.0)
MCV: 93.3 fL (ref 78.0–100.0)
PLATELETS: 148 10*3/uL — AB (ref 150–400)
RBC: 4.78 MIL/uL (ref 3.87–5.11)
RDW: 13.7 % (ref 11.5–15.5)
WBC: 4.4 10*3/uL (ref 4.0–10.5)

## 2016-01-01 LAB — BASIC METABOLIC PANEL
ANION GAP: 8 (ref 5–15)
BUN: 16 mg/dL (ref 6–20)
CHLORIDE: 108 mmol/L (ref 101–111)
CO2: 26 mmol/L (ref 22–32)
CREATININE: 1.22 mg/dL — AB (ref 0.44–1.00)
Calcium: 10.1 mg/dL (ref 8.9–10.3)
GFR calc non Af Amer: 48 mL/min — ABNORMAL LOW (ref >60)
GFR, EST AFRICAN AMERICAN: 55 mL/min — AB (ref >60)
Glucose, Bld: 104 mg/dL — ABNORMAL HIGH (ref 65–99)
POTASSIUM: 4.2 mmol/L (ref 3.5–5.1)
SODIUM: 142 mmol/L (ref 135–145)

## 2016-01-01 LAB — I-STAT TROPONIN, ED: Troponin i, poc: 0.01 ng/mL (ref 0.00–0.08)

## 2016-01-01 MED ORDER — IOPAMIDOL (ISOVUE-370) INJECTION 76%
100.0000 mL | Freq: Once | INTRAVENOUS | Status: AC | PRN
  Administered 2016-01-01: 80 mL via INTRAVENOUS

## 2016-01-01 MED ORDER — HYDROMORPHONE HCL 1 MG/ML IJ SOLN
1.0000 mg | Freq: Once | INTRAMUSCULAR | Status: AC
  Administered 2016-01-01: 1 mg via INTRAVENOUS
  Filled 2016-01-01: qty 1

## 2016-01-01 MED ORDER — PREDNISONE 10 MG PO TABS: 10.0000 mg | ORAL_TABLET | Freq: Every day | ORAL | 0 refills | Status: DC

## 2016-01-01 MED ORDER — IOPAMIDOL (ISOVUE-370) INJECTION 76%
INTRAVENOUS | Status: AC
  Filled 2016-01-01: qty 100

## 2016-01-01 MED ORDER — SODIUM CHLORIDE 0.9 % IJ SOLN
INTRAMUSCULAR | Status: AC
  Filled 2016-01-01: qty 50

## 2016-01-01 MED ORDER — AMOXICILLIN-POT CLAVULANATE 875-125 MG PO TABS: 2.0000 | ORAL_TABLET | Freq: Two times a day (BID) | ORAL | 0 refills | Status: AC

## 2016-01-01 NOTE — ED Provider Notes (Signed)
[name] Provider Note   CSN: [id number] Arrival date & time: 01/01/16  1155     History   Chief Complaint Chief Complaint  Patient presents with  . Chest Pain    HPI Janice MAKENZYE ENGLEHART is a 60 y.o. female with past medical history of cirrhosis, CKD, COPD, cavitary lesion of lung, PE, DVT, hepatitis B, pneumonia presenting to the ED with chest pain radiating to the back since 9 AM. She characterizes her pain as gradually worsening, pleuritic, sharp, radiating to her back, 6/10 pain. She reports is feeling very similar to her previous pneumonia. She states that she is taking her medications as prescribed including her blood thinner, xarelto. She states that she's had a worsening nonproductive cough for the last 3 days and sore throat. Patient admits to generalized weakness, no LOC, numbness, no focal neurological deficits.  Patient reports history of smoking. Patient denies nausea, vomiting, diarrhea, fever, or chills.  HPI  Past Medical History:  Diagnosis Date  . Agoraphobia with panic attacks   . Altered mental state 10/26/2013  . Ankle fracture, left 11/27/2010   S/p ORIF 11/2   . Anxiety   . Arthritis    "knees; lower back" (10/30/2013)  . Bipolar disorder ([place])   . Cavitary pneumonia   . Cirrhosis ([place])   . CKD (chronic kidney disease), stage III   . COPD (chronic obstructive pulmonary disease) ([place])   . Depression   . DVT (deep venous thrombosis) ([place]) 09/2013   RLE  . GERD (gastroesophageal reflux disease)   . Heart murmur   . Hepatitis B   . History of blood transfusion    "due to excessive blood loss before hysterectomy"  . History of urinary tract infection   . Hypertension    currently on no medication   . Kidney stones   . Pneumonia    "4 times in the past year" (10/30/2013)  . Pulmonary emboli ([place]) 09/2013  . Shortness of breath dyspnea    increased exertion;exercise  . Urinary frequency   . Urinary incontinence     Patient Active Problem List   Diagnosis Date Noted  . Right lower lobe pneumonia ([place]) 05/31/2015  . Right upper lobe pneumonia ([place]) 05/31/2015  . CAP (community acquired pneumonia) 05/31/2015  . Thrombocytopenia ([place]) 05/31/2015  . UTI (lower urinary tract infection) 05/26/2015  . Right flank pain   . Incarcerated incisional hernia s/p lap repair w mesh 12/10/2014 12/10/2014  . Cavitary lesion of lung 12/08/2013  . DVT (deep venous thrombosis) ([place]) 10/31/2013  . PE (pulmonary embolism) 10/26/2013  . Kidney stone 10/04/2013  . Tobacco abuse 10/04/2013  . COPD (chronic obstructive pulmonary disease) ([place])   . Unspecified constipation 12/23/2012  . Steroid-induced hyperglycemia 12/22/2012  . CKD (chronic kidney disease), stage III 12/21/2012  . Sepsis ([place]) 12/21/2012  . Cocaine abuse 11/27/2010  . Obesity 09/01/2008  . Hepatitis B virus infection 08/31/2008  . Acute hepatitis C virus infection 08/31/2008  . Iron deficiency anemia 08/31/2008  . Anxiety state 08/31/2008  . Essential hypertension 08/31/2008  . Coronary atherosclerosis 08/31/2008  . Hepatic cirrhosis ([place]) 08/31/2008    Past Surgical History:  Procedure Laterality Date  . ABDOMINAL HYSTERECTOMY    . ANKLE FRACTURE SURGERY Left   . CARPAL TUNNEL RELEASE Left   . DILATION AND CURETTAGE OF UTERUS  X 2  . FRACTURE SURGERY    . HAND SURGERY     1980's/left hand   . INSERTION OF MESH N/A 12/10/2014  Procedure: INSERTION OF MESH;  Surgeon: Michael Boston, MD;  Location: [facility];  Service: General;  Laterality: N/A;  . LAPAROSCOPIC ASSISTED VENTRAL HERNIA REPAIR N/A 12/10/2014   Procedure: LAPAROSCOPIC ASSISTED REPAIR OF INCARCERATED UMBILICAL HERNIA ;  Surgeon: Michael Boston, MD;  Location: [facility];  Service: General;  Laterality: N/A;  . TUBAL LIGATION      OB History    No data available       Home Medications    Prior to Admission medications   Medication Sig Start Date End Date Taking? Authorizing Provider  albuterol (PROAIR HFA) 108  (90 Base) MCG/ACT inhaler INHALE 2 PUFFS INTO THE LUNGS EVERY 4 HOURS AS NEEDED FOR WHEEZING 12/14/15  Yes Historical Provider, MD  ALPRAZolam Duanne Moron) 1 MG tablet Take 0.5 tablets (0.5 mg total) by mouth 2 (two) times daily as needed for anxiety. Patient taking differently: Take 1 mg by mouth 4 (four) times daily.  06/02/15  Yes Albertine Patricia, MD  budesonide-formoterol (SYMBICORT) 160-4.5 MCG/ACT inhaler Inhale 2 puffs into the lungs 2 (two) times daily.  12/23/15 12/22/16 Yes Historical Provider, MD  citalopram (CELEXA) 20 MG tablet Take 40 mg by mouth at bedtime.    Yes Historical Provider, MD  doxepin (SINEQUAN) 75 MG capsule Take 150 mg by mouth at bedtime.    Yes Historical Provider, MD  EMBEDA 30-1.2 MG CPCR Take 1 tablet by mouth daily. 11/08/15  Yes Historical Provider, MD  entecavir (BARACLUDE) 0.5 MG tablet Take 0.5 mg by mouth every other day.    Yes Historical Provider, MD  Iloperidone (FANAPT) 6 MG TABS Take 12 mg by mouth every morning.    Yes Historical Provider, MD  Multiple Vitamin (MULTIVITAMIN WITH MINERALS) TABS tablet Take 1 tablet by mouth daily.   Yes Historical Provider, MD  oxyCODONE-acetaminophen (PERCOCET) 7.5-325 MG tablet Take 1 tablet by mouth 4 (four) times daily.   Yes Historical Provider, MD  pantoprazole (PROTONIX) 40 MG tablet Take 40 mg by mouth daily. 12/23/13  Yes Historical Provider, MD  PROAIR HFA 108 (90 Base) MCG/ACT inhaler INHALE 2 PUFFS INTO THE LUNGS EVERY 6 HOURS AS NEEDED FOR WHEEZING OR SHORTNESS OF BREATH. 02/01/15  Yes Rigoberto Noel, MD  rivaroxaban (XARELTO) 20 MG TABS tablet Take 20 mg by mouth daily.   Yes Historical Provider, MD  spironolactone (ALDACTONE) 50 MG tablet Take 100 mg by mouth every morning. 03/10/15  Yes Historical Provider, MD  temazepam (RESTORIL) 30 MG capsule Take 30 mg by mouth at bedtime. 09/14/14  Yes Historical Provider, MD  amoxicillin-clavulanate (AUGMENTIN) 875-125 MG tablet Take 2 tablets by mouth 2 (two) times daily.  01/01/16 01/08/16  [place], [place]  FLUARIX QUADRIVALENT 0.5 ML injection  10/02/15   Historical Provider, MD  ibuprofen (ADVIL,MOTRIN) 800 MG tablet Take 1 tablet (800 mg total) by mouth 3 (three) times daily. Patient not taking: Reported on 01/01/2016 11/12/15   Daryl F de Villier II, PA  predniSONE (DELTASONE) 10 MG tablet Take 1 tablet (10 mg total) by mouth daily. Take 6 tabs day 1, 5 tabs day 2, 4 tabs day 3, 3 tabs day 4, 2 tabs day 5, and 1 on day 6 01/01/16   [place], [place]  Spacer/Aero-Holding Chambers (AEROCHAMBER MV) inhaler Use as instructed Patient not taking: Reported on 01/01/2016 10/08/14   Rigoberto Noel, MD    Family History Family History  Problem Relation Age of Onset  . Stroke Mother   . Lung cancer Father  Social History Social History  Substance Use Topics  . Smoking status: Former Smoker    Packs/day: 1.00    Years: 35.00    Types: Cigarettes    Quit date: 10/26/2013  . Smokeless tobacco: Never Used  . Alcohol use No     Comment: "stopped drinking  in 2004"     Allergies   Heparin; Amitriptyline hcl; Gabapentin; Cymbalta [duloxetine hcl]; and Neomycin-bacitracin zn-polymyx   Review of Systems Review of Systems  Constitutional: Negative for chills and fever.  HENT: Positive for sore throat. Negative for ear pain.   Eyes: Negative for pain and visual disturbance.  Respiratory: Positive for cough and shortness of breath.   Cardiovascular: Positive for chest pain. Negative for palpitations.  Gastrointestinal: Negative for abdominal pain, constipation, diarrhea, nausea and vomiting.  Genitourinary: Negative for difficulty urinating, dysuria and hematuria.  Musculoskeletal: Positive for back pain. Negative for arthralgias.  Skin: Negative for color change and rash.  Neurological: Positive for weakness. Negative for syncope.  All other systems reviewed and are negative.    Physical Exam Updated Vital Signs BP 98/70 (BP Location:  Right Arm)   Pulse (!) 59   Temp 97.7 F (36.5 C) (Oral)   Resp 20   Ht 5\' 4"  (1.626 m)   Wt 104.3 kg   SpO2 97%   BMI 39.48 kg/m   Physical Exam  Constitutional: She is oriented to person, place, and time. She appears well-developed and well-nourished.  HENT:  Head: Normocephalic and atraumatic.  Nose: Nose normal.  Eyes: Conjunctivae and EOM are normal. Pupils are equal, round, and reactive to light.  Neck: Normal range of motion. Neck supple.  Cardiovascular: Normal rate and normal heart sounds.   TTP center chest  Pulmonary/Chest: Effort normal and breath sounds normal. No respiratory distress. She exhibits no tenderness.  Abdominal: Soft. Bowel sounds are normal. There is tenderness (LUQ). There is no rebound and no guarding.  Musculoskeletal: Normal range of motion.  Neurological: She is alert and oriented to person, place, and time.  Skin: Skin is warm. Capillary refill takes less than 2 seconds.  Psychiatric: She has a normal mood and affect. Her behavior is normal.  Nursing note and vitals reviewed.    ED Treatments / Results  Labs (all labs ordered are listed, but only abnormal results are displayed) Labs Reviewed  BASIC METABOLIC PANEL - Abnormal; Notable for the following:       Result Value   Glucose, Bld 104 (*)    Creatinine, Ser 1.22 (*)    GFR calc non Af Amer 48 (*)    GFR calc Af Amer 55 (*)    All other components within normal limits  CBC - Abnormal; Notable for the following:    Hemoglobin 15.1 (*)    Platelets 148 (*)    All other components within normal limits  I-STAT TROPOININ, ED    EKG  EKG Interpretation  Date/Time:  Saturday January 01 2016 12:05:35 EST Ventricular Rate:  73 PR Interval:    QRS Duration: 93 QT Interval:  399 QTC Calculation: 440 R Axis:   -10 Text Interpretation:  Sinus rhythm Confirmed by Zenia Resides  MD, ANTHONY ([id number]) on 01/01/2016 12:27:30 PM       Radiology Dg Chest 2 View  Result Date:  01/01/2016 CLINICAL DATA:  Generalized chest pain through to back, shortness of breath, cough with phlegm, symptoms for 2-3 days; history COPD, cirrhosis, pulmonary embolism, hypertension, stage III chronic kidney disease EXAM: CHEST  2  VIEW COMPARISON:  05/30/2015 FINDINGS: Normal heart size, mediastinal contours, and pulmonary vascularity. Chronic bronchitic changes with minimal RIGHT upper lobe scarring and improved atelectasis at LEFT base. Lungs otherwise clear. No acute infiltrate, pleural effusion or pneumothorax. Bones appear demineralized with degenerative disc disease changes thoracic spine. IMPRESSION: Chronic bronchitic changes with RIGHT upper lobe scarring and improved LEFT basilar atelectasis. No acute abnormalities. Electronically Signed   By: Lavonia Dana M.D.   On: 01/01/2016 12:31   Ct Angio Chest Pe W And/or Wo Contrast  Result Date: 01/01/2016 CLINICAL DATA:  Central chest pain radiating to the back. History of pulmonary embolus and DVTs, on blood thinners. EXAM: CT ANGIOGRAPHY CHEST WITH CONTRAST TECHNIQUE: Multidetector CT imaging of the chest was performed using the standard protocol during bolus administration of intravenous contrast. Multiplanar CT image reconstructions and MIPs were obtained to evaluate the vascular anatomy. CONTRAST:  80 cc Isovue 370 COMPARISON:  05/30/2015 FINDINGS: Cardiovascular: Late but adequate timing of the pulmonary arterial bolus. No filling defect is identified in the pulmonary arterial tree to suggest pulmonary embolus. Trace pericardial fluid anteriorly. Mediastinum/Nodes: No adenopathy Lungs/Pleura: Mild scarring in the right upper lobe. Mild reticulonodular opacities in both lower lobes compatible with atypical infectious bronchiolitis. There is bilateral airway thickening with scattered airway plugging, airway plugging more notable in the lower lobes and right middle lobe. Upper Abdomen: Nodular liver compatible with cirrhosis. No arterially phase  enhancing nodules in the visualized part of the liver. Musculoskeletal: Thoracic spondylosis. Review of the MIP images confirms the above findings. IMPRESSION: 1. No filling defect is identified in the pulmonary arterial tree to suggest pulmonary embolus. 2. Bilateral airway thickening with scattered airway plugging and evidence of atypical infectious bronchiolitis in the lower lobes. 3. Chronic scarring in the right upper lobe. 4. Hepatic cirrhosis. Electronically Signed   By: Van Clines M.D.   On: 01/01/2016 14:02    Procedures Procedures (including critical care time)  Medications Ordered in ED Medications  HYDROmorphone (DILAUDID) injection 1 mg (1 mg Intravenous Given 01/01/16 1400)  iopamidol (ISOVUE-370) 76 % injection 100 mL (80 mLs Intravenous Contrast Given 01/01/16 1344)     Initial Impression / Assessment and Plan / ED Course  I have reviewed the triage vital signs and the nursing notes.  Pertinent labs & imaging results that were available during my care of the patient were reviewed by me and considered in my medical decision making (see chart for details).  Clinical Course   Patient is a 60 year old female complaining of chest pain since 9 AM today. On exam patient afebrile,VSS, with good oxygen saturation at room air. Heart and lungs clear. Lungs had some adventitious sounds consistent with rales. EKG shows no acute findings. Negative troponin. Chest x-ray negative for acute findings. CTA scan of chest ordered due to previous history of PE and DVT, although she states that she takes Xarelto as prescribed. CT negative for PE but does show evidence of infection. Will treat with antibiotics and steroids. Low suspicion for ACS. Patient understood. Patient agree with assessment and plan. Patient  NAD, hemodynamically stable, good oxygen saturation at room air. Patient told to follow up with PCP in 2-5 days. Return precautions given for any new or worsening symptoms such as fever,  chills, increased chest pain, shortness of breath.  Final Clinical Impressions(s) / ED Diagnoses   Final diagnoses:  Dyspnea, unspecified type    New Prescriptions Discharge Medication List as of 01/01/2016  3:14 PM    START taking these  medications   Details  amoxicillin-clavulanate (AUGMENTIN) 875-125 MG tablet Take 2 tablets by mouth 2 (two) times daily., Starting Sat 01/01/2016, Until Sat 01/08/2016, Print    predniSONE (DELTASONE) 10 MG tablet Take 1 tablet (10 mg total) by mouth daily. Take 6 tabs day 1, 5 tabs day 2, 4 tabs day 3, 3 tabs day 4, 2 tabs day 5, and 1 on day 6, Starting Sat 01/01/2016, Print         [place], [place] 01/01/16 [place], [place] 01/01/16 2329    Lacretia Leigh, MD 01/05/16 [phone]

## 2016-01-01 NOTE — Discharge Instructions (Signed)
Please take 2 tablets of Augmentin twice a day for 7 days.. Take prednisone 6 tabs day 1, 5 tabs day 2, 4 tabs day 3, 3 tabs day 4, 2 tabs day 5, and 1 on day 6. Take albuterol inhaler with spacer 2 puffs every 4 hours as needed.  Get help right away if: Your shortness of breath gets worse. You have shortness of breath when you are resting. You feel light-headed or you faint. You have a cough that is not controlled with medicines. You cough up blood. You have pain with breathing. You have pain in your chest, arms, shoulders, or abdomen. You have a fever. You cannot walk up stairs or exercise the way that you normally do.

## 2016-01-01 NOTE — ED Notes (Signed)
Nurse is in the room collecting labs 

## 2016-01-01 NOTE — ED Notes (Signed)
Unable to collect  Labs patient wants them done when he gets an IV

## 2016-01-01 NOTE — ED Triage Notes (Signed)
Patient states she is having central chest pain that radiates to her back.  Pain is associated with weakness and lightness.  Patient has a history of smoking.  Denies LOC and  N/V/D.

## 2016-01-01 NOTE — ED Notes (Signed)
Patient transported to X-ray 

## 2016-01-01 NOTE — ED Provider Notes (Signed)
Medical screening examination/treatment/procedure(s) were conducted as a shared visit with non-physician practitioner(s) and myself.  I personally evaluated the patient during the encounter.   EKG Interpretation  Date/Time:  Saturday January 01 2016 12:05:35 EST Ventricular Rate:  73 PR Interval:    QRS Duration: 93 QT Interval:  399 QTC Calculation: 440 R Axis:   -10 Text Interpretation:  Sinus rhythm Confirmed by Zenia Resides  MD, Jillienne Egner ([id number]) on 01/01/2016 12:27:30 PM     Patient here complaining of one week history of cough and congestion as well as URI symptoms. She's had chest discomfort only when she coughs. CT scan of the chest was negative for PE but does show evidence of infection. Will treat with antibiotics and steroids. Do not think this represents ACS.   Lacretia Leigh, MD 01/01/16 1444

## 2016-01-10 ENCOUNTER — Ambulatory Visit (INDEPENDENT_AMBULATORY_CARE_PROVIDER_SITE_OTHER): Payer: Medicare Other | Admitting: Orthopaedic Surgery

## 2016-06-09 ENCOUNTER — Encounter (HOSPITAL_COMMUNITY): Payer: Self-pay | Admitting: Emergency Medicine

## 2016-06-09 ENCOUNTER — Observation Stay (HOSPITAL_COMMUNITY)
Admission: EM | Admit: 2016-06-09 | Discharge: 2016-06-11 | Disposition: A | Discharge status: Home / Self Care | Payer: Medicare Other | Attending: Family Medicine | Admitting: Family Medicine

## 2016-06-09 ENCOUNTER — Emergency Department (HOSPITAL_COMMUNITY): Payer: Medicare Other

## 2016-06-09 DIAGNOSIS — Z90710 Acquired absence of both cervix and uterus: Secondary | ICD-10-CM | POA: Insufficient documentation

## 2016-06-09 DIAGNOSIS — Z6839 Body mass index (BMI) 39.0-39.9, adult: Secondary | ICD-10-CM | POA: Insufficient documentation

## 2016-06-09 DIAGNOSIS — N179 Acute kidney failure, unspecified: Secondary | ICD-10-CM | POA: Diagnosis not present

## 2016-06-09 DIAGNOSIS — N183 Chronic kidney disease, stage 3 unspecified: Secondary | ICD-10-CM | POA: Diagnosis present

## 2016-06-09 DIAGNOSIS — F319 Bipolar disorder, unspecified: Secondary | ICD-10-CM | POA: Diagnosis not present

## 2016-06-09 DIAGNOSIS — J9811 Atelectasis: Secondary | ICD-10-CM | POA: Diagnosis not present

## 2016-06-09 DIAGNOSIS — E669 Obesity, unspecified: Secondary | ICD-10-CM

## 2016-06-09 DIAGNOSIS — Z888 Allergy status to other drugs, medicaments and biological substances status: Secondary | ICD-10-CM | POA: Diagnosis not present

## 2016-06-09 DIAGNOSIS — J9601 Acute respiratory failure with hypoxia: Secondary | ICD-10-CM | POA: Diagnosis not present

## 2016-06-09 DIAGNOSIS — Z7901 Long term (current) use of anticoagulants: Secondary | ICD-10-CM | POA: Diagnosis not present

## 2016-06-09 DIAGNOSIS — Z720 Tobacco use: Secondary | ICD-10-CM | POA: Diagnosis present

## 2016-06-09 DIAGNOSIS — Z79899 Other long term (current) drug therapy: Secondary | ICD-10-CM | POA: Diagnosis not present

## 2016-06-09 DIAGNOSIS — Z86718 Personal history of other venous thrombosis and embolism: Secondary | ICD-10-CM

## 2016-06-09 DIAGNOSIS — F411 Generalized anxiety disorder: Secondary | ICD-10-CM | POA: Diagnosis not present

## 2016-06-09 DIAGNOSIS — K219 Gastro-esophageal reflux disease without esophagitis: Secondary | ICD-10-CM | POA: Insufficient documentation

## 2016-06-09 DIAGNOSIS — F17210 Nicotine dependence, cigarettes, uncomplicated: Secondary | ICD-10-CM | POA: Insufficient documentation

## 2016-06-09 DIAGNOSIS — I129 Hypertensive chronic kidney disease with stage 1 through stage 4 chronic kidney disease, or unspecified chronic kidney disease: Secondary | ICD-10-CM | POA: Diagnosis not present

## 2016-06-09 DIAGNOSIS — G8929 Other chronic pain: Secondary | ICD-10-CM | POA: Diagnosis not present

## 2016-06-09 DIAGNOSIS — Z883 Allergy status to other anti-infective agents status: Secondary | ICD-10-CM | POA: Insufficient documentation

## 2016-06-09 DIAGNOSIS — J441 Chronic obstructive pulmonary disease with (acute) exacerbation: Principal | ICD-10-CM | POA: Insufficient documentation

## 2016-06-09 DIAGNOSIS — Z7951 Long term (current) use of inhaled steroids: Secondary | ICD-10-CM | POA: Insufficient documentation

## 2016-06-09 DIAGNOSIS — K7460 Unspecified cirrhosis of liver: Secondary | ICD-10-CM | POA: Diagnosis not present

## 2016-06-09 DIAGNOSIS — Z86711 Personal history of pulmonary embolism: Secondary | ICD-10-CM | POA: Diagnosis not present

## 2016-06-09 DIAGNOSIS — N1832 Chronic kidney disease, stage 3b: Secondary | ICD-10-CM | POA: Diagnosis present

## 2016-06-09 LAB — CBC WITH DIFFERENTIAL/PLATELET
BASOS ABS: 0 10*3/uL (ref 0.0–0.1)
BASOS PCT: 0 %
Eosinophils Absolute: 0 10*3/uL (ref 0.0–0.7)
Eosinophils Relative: 0 %
HEMATOCRIT: 45.3 % (ref 36.0–46.0)
Hemoglobin: 15.6 g/dL — ABNORMAL HIGH (ref 12.0–15.0)
Lymphocytes Relative: 11 %
Lymphs Abs: 0.8 10*3/uL (ref 0.7–4.0)
MCH: 31 pg (ref 26.0–34.0)
MCHC: 34.4 g/dL (ref 30.0–36.0)
MCV: 90.1 fL (ref 78.0–100.0)
Monocytes Absolute: 0.1 10*3/uL (ref 0.1–1.0)
Monocytes Relative: 1 %
NEUTROS ABS: 6.3 10*3/uL (ref 1.7–7.7)
NEUTROS PCT: 88 %
Platelets: 120 10*3/uL — ABNORMAL LOW (ref 150–400)
RBC: 5.03 MIL/uL (ref 3.87–5.11)
RDW: 13.5 % (ref 11.5–15.5)
WBC: 7.2 10*3/uL (ref 4.0–10.5)

## 2016-06-09 LAB — I-STAT TROPONIN, ED: Troponin i, poc: 0 ng/mL (ref 0.00–0.08)

## 2016-06-09 LAB — BASIC METABOLIC PANEL
ANION GAP: 9 (ref 5–15)
BUN: 20 mg/dL (ref 6–20)
CHLORIDE: 104 mmol/L (ref 101–111)
CO2: 22 mmol/L (ref 22–32)
Calcium: 9.3 mg/dL (ref 8.9–10.3)
Creatinine, Ser: 1.27 mg/dL — ABNORMAL HIGH (ref 0.44–1.00)
GFR calc Af Amer: 52 mL/min — ABNORMAL LOW (ref >60)
GFR calc non Af Amer: 45 mL/min — ABNORMAL LOW (ref >60)
GLUCOSE: 123 mg/dL — AB (ref 65–99)
POTASSIUM: 3.6 mmol/L (ref 3.5–5.1)
Sodium: 135 mmol/L (ref 135–145)

## 2016-06-09 LAB — BRAIN NATRIURETIC PEPTIDE: B NATRIURETIC PEPTIDE 5: 18.9 pg/mL (ref 0.0–100.0)

## 2016-06-09 MED ORDER — NICOTINE 21 MG/24HR TD PT24
21.0000 mg | MEDICATED_PATCH | Freq: Every day | TRANSDERMAL | Status: DC
  Administered 2016-06-10 – 2016-06-11 (×2): 21 mg via TRANSDERMAL
  Filled 2016-06-09 (×2): qty 1

## 2016-06-09 MED ORDER — SODIUM CHLORIDE 0.9% FLUSH
3.0000 mL | Freq: Two times a day (BID) | INTRAVENOUS | Status: DC
  Administered 2016-06-09 – 2016-06-11 (×4): 3 mL via INTRAVENOUS

## 2016-06-09 MED ORDER — ONDANSETRON HCL 4 MG PO TABS: 4.0000 mg | ORAL_TABLET | Freq: Four times a day (QID) | ORAL | Status: DC | PRN

## 2016-06-09 MED ORDER — OXYCODONE-ACETAMINOPHEN 7.5-325 MG PO TABS
1.0000 | ORAL_TABLET | Freq: Four times a day (QID) | ORAL | Status: DC
  Administered 2016-06-09 – 2016-06-11 (×7): 1 via ORAL
  Filled 2016-06-09 (×7): qty 1

## 2016-06-09 MED ORDER — ALBUTEROL SULFATE (2.5 MG/3ML) 0.083% IN NEBU
3.0000 mL | INHALATION_SOLUTION | RESPIRATORY_TRACT | Status: DC | PRN
  Administered 2016-06-10 – 2016-06-11 (×3): 3 mL via RESPIRATORY_TRACT
  Filled 2016-06-09 (×3): qty 3

## 2016-06-09 MED ORDER — ILOPERIDONE 4 MG PO TABS
12.0000 mg | ORAL_TABLET | Freq: Every day | ORAL | Status: DC
  Administered 2016-06-10 – 2016-06-11 (×2): 12 mg via ORAL
  Filled 2016-06-09 (×2): qty 3

## 2016-06-09 MED ORDER — ENTECAVIR 0.5 MG PO TABS: 0.5000 mg | ORAL_TABLET | ORAL | Status: DC

## 2016-06-09 MED ORDER — ALPRAZOLAM 0.5 MG PO TABS
0.5000 mg | ORAL_TABLET | Freq: Two times a day (BID) | ORAL | Status: DC | PRN
  Administered 2016-06-10 (×2): 0.5 mg via ORAL
  Filled 2016-06-09 (×2): qty 1

## 2016-06-09 MED ORDER — PANTOPRAZOLE SODIUM 40 MG PO TBEC
40.0000 mg | DELAYED_RELEASE_TABLET | Freq: Every day | ORAL | Status: DC
  Administered 2016-06-10 – 2016-06-11 (×2): 40 mg via ORAL
  Filled 2016-06-09 (×2): qty 1

## 2016-06-09 MED ORDER — ALBUTEROL (5 MG/ML) CONTINUOUS INHALATION SOLN
10.0000 mg/h | INHALATION_SOLUTION | Freq: Once | RESPIRATORY_TRACT | Status: AC
  Administered 2016-06-09: 10 mg/h via RESPIRATORY_TRACT
  Filled 2016-06-09: qty 20

## 2016-06-09 MED ORDER — SODIUM CHLORIDE 0.9% FLUSH: 3.0000 mL | INTRAVENOUS | Status: DC | PRN

## 2016-06-09 MED ORDER — METHYLPREDNISOLONE SODIUM SUCC 125 MG IJ SOLR
60.0000 mg | Freq: Two times a day (BID) | INTRAMUSCULAR | Status: AC
  Administered 2016-06-09 – 2016-06-10 (×2): 60 mg via INTRAVENOUS
  Filled 2016-06-09 (×2): qty 2

## 2016-06-09 MED ORDER — ZOLPIDEM TARTRATE 5 MG PO TABS
5.0000 mg | ORAL_TABLET | Freq: Once | ORAL | Status: AC
  Administered 2016-06-09: 5 mg via ORAL
  Filled 2016-06-09: qty 1

## 2016-06-09 MED ORDER — SODIUM CHLORIDE 0.9 % IV SOLN: 250.0000 mL | INTRAVENOUS | Status: DC | PRN

## 2016-06-09 MED ORDER — AZITHROMYCIN 250 MG PO TABS
500.0000 mg | ORAL_TABLET | Freq: Once | ORAL | Status: AC
  Administered 2016-06-09: 500 mg via ORAL
  Filled 2016-06-09: qty 2

## 2016-06-09 MED ORDER — ONDANSETRON HCL 4 MG/2ML IJ SOLN
4.0000 mg | Freq: Four times a day (QID) | INTRAMUSCULAR | Status: DC | PRN
Start: 2016-06-09 — End: 2016-06-11

## 2016-06-09 MED ORDER — IPRATROPIUM-ALBUTEROL 0.5-2.5 (3) MG/3ML IN SOLN
3.0000 mL | RESPIRATORY_TRACT | Status: DC | PRN
  Administered 2016-06-09: 3 mL via RESPIRATORY_TRACT

## 2016-06-09 MED ORDER — MOMETASONE FURO-FORMOTEROL FUM 200-5 MCG/ACT IN AERO
2.0000 | INHALATION_SPRAY | Freq: Two times a day (BID) | RESPIRATORY_TRACT | Status: DC
  Administered 2016-06-10: 2 via RESPIRATORY_TRACT
  Filled 2016-06-09: qty 8.8

## 2016-06-09 MED ORDER — HYDROCODONE-ACETAMINOPHEN 5-325 MG PO TABS
1.0000 | ORAL_TABLET | Freq: Once | ORAL | Status: AC
  Administered 2016-06-09: 1 via ORAL
  Filled 2016-06-09: qty 1

## 2016-06-09 MED ORDER — MAGNESIUM SULFATE 2 GM/50ML IV SOLN
2.0000 g | Freq: Once | INTRAVENOUS | Status: AC
  Administered 2016-06-09: 2 g via INTRAVENOUS
  Filled 2016-06-09: qty 50

## 2016-06-09 MED ORDER — ACETAMINOPHEN 325 MG PO TABS
650.0000 mg | ORAL_TABLET | Freq: Four times a day (QID) | ORAL | Status: DC | PRN
  Filled 2016-06-09: qty 2

## 2016-06-09 MED ORDER — DEXTROSE 5 % IV SOLN: 500.0000 mg | Freq: Once | INTRAVENOUS | Status: DC

## 2016-06-09 MED ORDER — SPIRONOLACTONE 25 MG PO TABS
100.0000 mg | ORAL_TABLET | Freq: Every morning | ORAL | Status: DC
  Administered 2016-06-10 – 2016-06-11 (×2): 100 mg via ORAL
  Filled 2016-06-09 (×2): qty 4

## 2016-06-09 MED ORDER — RIVAROXABAN 20 MG PO TABS
20.0000 mg | ORAL_TABLET | Freq: Every day | ORAL | Status: DC
  Administered 2016-06-10 – 2016-06-11 (×2): 20 mg via ORAL
  Filled 2016-06-09 (×2): qty 1

## 2016-06-09 MED ORDER — ADULT MULTIVITAMIN W/MINERALS CH
1.0000 | ORAL_TABLET | Freq: Every day | ORAL | Status: DC
  Administered 2016-06-10 – 2016-06-11 (×2): 1 via ORAL
  Filled 2016-06-09 (×2): qty 1

## 2016-06-09 MED ORDER — ILOPERIDONE 6 MG PO TABS: 12.0000 mg | ORAL_TABLET | Freq: Every morning | ORAL | Status: DC

## 2016-06-09 MED ORDER — IPRATROPIUM-ALBUTEROL 0.5-2.5 (3) MG/3ML IN SOLN
3.0000 mL | RESPIRATORY_TRACT | Status: DC
  Administered 2016-06-09 – 2016-06-10 (×4): 3 mL via RESPIRATORY_TRACT
  Filled 2016-06-09 (×5): qty 3

## 2016-06-09 MED ORDER — AZITHROMYCIN 250 MG PO TABS
250.0000 mg | ORAL_TABLET | Freq: Every day | ORAL | Status: DC
  Administered 2016-06-10 – 2016-06-11 (×2): 250 mg via ORAL
  Filled 2016-06-09 (×2): qty 1

## 2016-06-09 MED ORDER — DEXTROSE 5 % IV SOLN
1.0000 g | Freq: Once | INTRAVENOUS | Status: AC
  Administered 2016-06-09: 1 g via INTRAVENOUS
  Filled 2016-06-09: qty 10

## 2016-06-09 MED ORDER — METHOCARBAMOL 500 MG PO TABS
500.0000 mg | ORAL_TABLET | Freq: Once | ORAL | Status: AC
  Administered 2016-06-09: 500 mg via ORAL
  Filled 2016-06-09: qty 1

## 2016-06-09 MED ORDER — ACETAMINOPHEN 650 MG RE SUPP: 650.0000 mg | Freq: Four times a day (QID) | RECTAL | Status: DC | PRN

## 2016-06-09 MED ORDER — PREDNISONE 20 MG PO TABS
40.0000 mg | ORAL_TABLET | Freq: Every day | ORAL | Status: DC
  Administered 2016-06-10: 40 mg via ORAL
  Filled 2016-06-09: qty 2

## 2016-06-09 MED ORDER — NICOTINE 21 MG/24HR TD PT24
21.0000 mg | MEDICATED_PATCH | Freq: Once | TRANSDERMAL | Status: AC
  Administered 2016-06-09: 21 mg via TRANSDERMAL
  Filled 2016-06-09: qty 1

## 2016-06-09 NOTE — ED Provider Notes (Signed)
[facility] DEPT Provider Note   CSN: [id number] Arrival date & time: 06/09/16  1306     History   Chief Complaint Chief Complaint  Patient presents with  . Shortness of Breath    HPI [name] is a 61 y.o. female who presents with shortness of breath. Past medical history significant for COPD, history of DVT/PE on Xarelto, history of pneumonia, CKD, chronic pain. She states she has been more short of breath over the past week. She reports frequent cough but she has not been able to cough anything up. She reports associated chest tightness and chest pain and back pain with coughing. She denies fevers but endorses chills and seasonal allergies (sneezing and itchy watery eyes). She is a current smoker and smokes half a pack a day. EMS gave her 2 breathing treatments and 125 mg of Solu-Medrol. She denies calf pain or swelling.   HPI  Past Medical History:  Diagnosis Date  . Agoraphobia with panic attacks   . Altered mental state 10/26/2013  . Ankle fracture, left 11/27/2010   S/p ORIF 11/2   . Anxiety   . Arthritis    "knees; lower back" (10/30/2013)  . Bipolar disorder ([place])   . Cavitary pneumonia   . Cirrhosis ([place])   . CKD (chronic kidney disease), stage III   . COPD (chronic obstructive pulmonary disease) ([place])   . Depression   . DVT (deep venous thrombosis) ([place]) 09/2013   RLE  . GERD (gastroesophageal reflux disease)   . Heart murmur   . Hepatitis B   . History of blood transfusion    "due to excessive blood loss before hysterectomy"  . History of urinary tract infection   . Hypertension    currently on no medication   . Kidney stones   . Pneumonia    "4 times in the past year" (10/30/2013)  . Pulmonary emboli ([place]) 09/2013  . Shortness of breath dyspnea    increased exertion;exercise  . Urinary frequency   . Urinary incontinence     Patient Active Problem List   Diagnosis Date Noted  . Right lower lobe pneumonia ([place]) 05/31/2015  . Right upper lobe  pneumonia ([place]) 05/31/2015  . CAP (community acquired pneumonia) 05/31/2015  . Thrombocytopenia ([place]) 05/31/2015  . UTI (lower urinary tract infection) 05/26/2015  . Right flank pain   . Incarcerated incisional hernia s/p lap repair w mesh 12/10/2014 12/10/2014  . Cavitary lesion of lung 12/08/2013  . DVT (deep venous thrombosis) ([place]) 10/31/2013  . PE (pulmonary embolism) 10/26/2013  . Kidney stone 10/04/2013  . Tobacco abuse 10/04/2013  . COPD (chronic obstructive pulmonary disease) ([place])   . Unspecified constipation 12/23/2012  . Steroid-induced hyperglycemia 12/22/2012  . CKD (chronic kidney disease), stage III 12/21/2012  . Sepsis ([place]) 12/21/2012  . Cocaine abuse 11/27/2010  . Obesity 09/01/2008  . Hepatitis B virus infection 08/31/2008  . Acute hepatitis C virus infection 08/31/2008  . Iron deficiency anemia 08/31/2008  . Anxiety state 08/31/2008  . Essential hypertension 08/31/2008  . Coronary atherosclerosis 08/31/2008  . Hepatic cirrhosis ([place]) 08/31/2008    Past Surgical History:  Procedure Laterality Date  . ABDOMINAL HYSTERECTOMY    . ANKLE FRACTURE SURGERY Left   . CARPAL TUNNEL RELEASE Left   . DILATION AND CURETTAGE OF UTERUS  X 2  . FRACTURE SURGERY    . HAND SURGERY     1980's/left hand   . INSERTION OF MESH N/A 12/10/2014   Procedure: INSERTION OF MESH;  Surgeon: Michael Boston, MD;  Location: [facility] ORS;  Service: General;  Laterality: N/A;  . LAPAROSCOPIC ASSISTED VENTRAL HERNIA REPAIR N/A 12/10/2014   Procedure: LAPAROSCOPIC ASSISTED REPAIR OF INCARCERATED UMBILICAL HERNIA ;  Surgeon: Michael Boston, MD;  Location: [facility];  Service: General;  Laterality: N/A;  . TUBAL LIGATION      OB History    No data available       Home Medications    Prior to Admission medications   Medication Sig Start Date End Date Taking? Authorizing Provider  albuterol (PROAIR HFA) 108 (90 Base) MCG/ACT inhaler INHALE 2 PUFFS INTO THE LUNGS EVERY 4 HOURS AS NEEDED FOR  WHEEZING 12/14/15  Yes [provider]  ALPRAZolam (XANAX) 1 MG tablet Take 0.5 tablets (0.5 mg total) by mouth 2 (two) times daily as needed for anxiety. Patient taking differently: Take 1 mg by mouth 4 (four) times daily.  06/02/15  Yes Elgergawy, Silver Huguenin, MD  budesonide-formoterol (SYMBICORT) 160-4.5 MCG/ACT inhaler Inhale 2 puffs into the lungs 2 (two) times daily.  12/23/15 12/22/16 Yes [provider]  citalopram (CELEXA) 20 MG tablet Take 40 mg by mouth at bedtime.    Yes [provider]  doxepin (SINEQUAN) 75 MG capsule Take 150 mg by mouth at bedtime.    Yes [provider]  EMBEDA 30-1.2 MG CPCR Take 1 tablet by mouth daily. 11/08/15  Yes [provider]  entecavir (BARACLUDE) 0.5 MG tablet Take 0.5 mg by mouth every other day.    Yes [provider]  ibuprofen (ADVIL,MOTRIN) 800 MG tablet Take 1 tablet (800 mg total) by mouth 3 (three) times daily. 11/12/15  Yes de Villier, Daryl F II, PA  Iloperidone (FANAPT) 6 MG TABS Take 12 mg by mouth every morning.    Yes [provider]  Multiple Vitamin (MULTIVITAMIN WITH MINERALS) TABS tablet Take 1 tablet by mouth daily.   Yes [provider]  oxyCODONE-acetaminophen (PERCOCET) 7.5-325 MG tablet Take 1 tablet by mouth 4 (four) times daily.   Yes [provider]  pantoprazole (PROTONIX) 40 MG tablet Take 40 mg by mouth daily. 12/23/13  Yes [provider]  PROAIR HFA 108 (90 Base) MCG/ACT inhaler INHALE 2 PUFFS INTO THE LUNGS EVERY 6 HOURS AS NEEDED FOR WHEEZING OR SHORTNESS OF BREATH. 02/01/15  Yes Rigoberto Noel, MD  rivaroxaban (XARELTO) 20 MG TABS tablet Take 20 mg by mouth daily.   Yes [provider]  spironolactone (ALDACTONE) 50 MG tablet Take 100 mg by mouth every morning. 03/10/15  Yes [provider]  temazepam (RESTORIL) 30 MG capsule Take 30 mg by mouth at bedtime. 09/14/14  Yes [provider]  FLUARIX QUADRIVALENT 0.5 ML  injection  10/02/15   [provider]  predniSONE (DELTASONE) 10 MG tablet Take 1 tablet (10 mg total) by mouth daily. Take 6 tabs day 1, 5 tabs day 2, 4 tabs day 3, 3 tabs day 4, 2 tabs day 5, and 1 on day 6 Patient not taking: Reported on 06/09/2016 01/01/16   Bettey Costa, [place]  Spacer/Aero-Holding Chambers (AEROCHAMBER MV) inhaler Use as instructed Patient not taking: Reported on 01/01/2016 10/08/14   Rigoberto Noel, MD    Family History Family History  Problem Relation Age of Onset  . Stroke Mother   . Lung cancer Father     Social History Social History  Substance Use Topics  . Smoking status: Current Every Day Smoker    Packs/day: 0.50  Years: 35.00    Types: Cigarettes    Last attempt to quit: 10/26/2013  . Smokeless tobacco: Never Used  . Alcohol use No     Comment: "stopped drinking  in 2004"     Allergies   Heparin; Amitriptyline hcl; Gabapentin; Cymbalta [duloxetine hcl]; and Neomycin-bacitracin zn-polymyx   Review of Systems Review of Systems  Constitutional: Positive for chills. Negative for fever.  Respiratory: Positive for cough, chest tightness, shortness of breath and wheezing.   Cardiovascular: Positive for chest pain. Negative for leg swelling.  Gastrointestinal: Negative for abdominal pain, diarrhea, nausea and vomiting.  Genitourinary: Negative for dysuria.  Musculoskeletal: Positive for back pain.  Allergic/Immunologic: Positive for environmental allergies.  All other systems reviewed and are negative.    Physical Exam Updated Vital Signs BP 131/89   Pulse 90   Resp 18   Ht 5\' 4"  (1.626 m)   Wt 230 lb (104.3 kg)   SpO2 94%   BMI 39.48 kg/m   Physical Exam  Constitutional: She is oriented to person, place, and time. She appears well-developed and well-nourished. No distress.  HENT:  Head: Normocephalic and atraumatic.  Right Ear: Hearing, tympanic membrane, external ear and ear canal normal.  Left Ear: Hearing, tympanic  membrane, external ear and ear canal normal.  Nose: Nose normal.  Mouth/Throat: Uvula is midline, oropharynx is clear and moist and mucous membranes are normal.  Eyes: Conjunctivae are normal. Pupils are equal, round, and reactive to light. Right eye exhibits no discharge. Left eye exhibits no discharge. No scleral icterus.  Neck: Normal range of motion.  Cardiovascular: Normal rate and regular rhythm.  Exam reveals no gallop and no friction rub.   No murmur heard. Pulmonary/Chest: Effort normal. No respiratory distress. She has decreased breath sounds. She has wheezes. She has rhonchi. She has no rales. She exhibits tenderness (generalized).  Abdominal: Soft. Bowel sounds are normal. She exhibits no distension. There is no tenderness.  Musculoskeletal:  Generalized back tenderness  Neurological: She is alert and oriented to person, place, and time.  Skin: Skin is warm and dry.  Psychiatric: She has a normal mood and affect. Her behavior is normal.  Nursing note and vitals reviewed.    ED Treatments / Results  Labs (all labs ordered are listed, but only abnormal results are displayed) Labs Reviewed  BASIC METABOLIC PANEL - Abnormal; Notable for the following:       Result Value   Glucose, Bld 123 (*)    Creatinine, Ser 1.27 (*)    GFR calc non Af Amer 45 (*)    GFR calc Af Amer 52 (*)    All other components within normal limits  CBC WITH DIFFERENTIAL/PLATELET - Abnormal; Notable for the following:    Hemoglobin 15.6 (*)    Platelets 120 (*)    All other components within normal limits  BRAIN NATRIURETIC PEPTIDE  I-STAT TROPOININ, ED    EKG  EKG Interpretation None       Radiology Dg Chest 2 View  Result Date: 06/09/2016 CLINICAL DATA:  61 year old female with shortness of breath, cough and chest pain for 1 week EXAM: CHEST  2 VIEW COMPARISON:  Prior chest x-ray and CT scan of the chest 01/01/2016 FINDINGS: Cardiac and mediastinal contours are unchanged and remain  within normal limits. There is an interval increase in interstitial prominence, particularly in both lung bases with streaky linear opacities most consistent with atelectasis. Background chronic bronchitic changes and hyperinflation appear similar. No overt edema, focal airspace  consolidation, pleural effusion or pneumothorax. No acute osseous abnormality. IMPRESSION: 1. Slightly increased interstitial prominence with new bibasilar subsegmental atelectasis. Findings are concerning for COPD exacerbation versus viral or atypical respiratory infection. Electronically Signed   By: Jacqulynn Cadet M.D.   On: 06/09/2016 13:51    Procedures Procedures (including critical care time)  Medications Ordered in ED Medications  methocarbamol (ROBAXIN) tablet 500 mg (not administered)  HYDROcodone-acetaminophen (NORCO/VICODIN) 5-325 MG per tablet 1 tablet (1 tablet Oral Given 06/09/16 1429)  magnesium sulfate IVPB 2 g 50 mL (0 g Intravenous Stopped 06/09/16 1503)  albuterol (PROVENTIL,VENTOLIN) solution continuous neb (10 mg/hr Nebulization Given 06/09/16 1446)  cefTRIAXone (ROCEPHIN) 1 g in dextrose 5 % 50 mL IVPB (1 g Intravenous New Bag/Given 06/09/16 1524)     Initial Impression / Assessment and Plan / ED Course  I have reviewed the triage vital signs and the nursing notes.  Pertinent labs & imaging results that were available during my care of the patient were reviewed by me and considered in my medical decision making (see chart for details).  61 year old with COPD exacerbation. She is hypoxic on RA to 87% at rest with mild increased work of breathing. She was placed on 3L O2. Otherwise vitals are normal. CBC unremarkable. BMP remarkable for mild hyperglycemia and elevation of SCr which appears at baseline. EKG is NSR. Trop is normal. BNP is normal. CXR remarkable for increased interstitial prominence with bibasilar atelectasis. Will give Mg and continuous neb.  On recheck, she still has diffuse rhonchi.  Ceftriaxone given. Spoke with Dr. Ree Kida who will admit patient.    Final Clinical Impressions(s) / ED Diagnoses   Final diagnoses:  COPD exacerbation [facility])    New Prescriptions New Prescriptions   No medications on file     Iris Pert 06/09/16 [place], Wenda Overland, MD 06/09/16 [id number]

## 2016-06-09 NOTE — ED Triage Notes (Addendum)
Pt from home via EMS with complaints of nonproductive cough x 1 week and SOB x 2 days. Pt has hx of PNA and COPD. Pt was given 15 mg of albuterol and 1 mg of atrovent for bilateral rhonchi and lower lung wheezes. Pt was also given given 125 mg of solumedrol in route.

## 2016-06-09 NOTE — H&P (Signed)
[facility] History and Physical  [name] TSV:[id number] DOB: 1955/12/18 DOA: 06/09/2016  PCP: Luetta Nutting, DO  Patient coming from: Home  Chief Complaint: Shortness of breath  HPI: [name] is a 61 y.o. female with a medical history of history of bipolar disorder, DVT/PE, anxiety, COPD, who presented to the emergency department with complaints of shortness of breath and cough for 1 week. Patient states that over the last week she's been having more cough, nonproductive with chest tightness and back pain. She does state that she has allergies from time to time. She does admit to smoking 1-2 packs of cigarettes per day. Patient was following with a pulmonologist however has not seen them in over a year. Patient denies any recent sick contacts or travel. She currently denies any chest pain, abdominal pain, nausea or vomiting, dizziness or headache, changes in bowel or urinary habits. Of note, patient does state that she has had to sleep on at least 2 pillows she has been unable to lie flat however this has worsened over the last week.  ED Course: Found have SPO2 87% on room air at rest. Given steroids, magnesium, antibiotics, nebulizer treatment. Chest x-ray showed COPD versus atypical respiratory infection. TRH called for admission.  Review of Systems:  All other systems reviewed and are negative.   Past Medical History:  Diagnosis Date  . Agoraphobia with panic attacks   . Altered mental state 10/26/2013  . Ankle fracture, left 11/27/2010   S/p ORIF 11/2   . Anxiety   . Arthritis    "knees; lower back" (10/30/2013)  . Bipolar disorder ([place])   . Cavitary pneumonia   . Cirrhosis ([place])   . CKD (chronic kidney disease), stage III   . COPD (chronic obstructive pulmonary disease) ([place])   . Depression   . DVT (deep venous thrombosis) ([place]) 09/2013   RLE  . GERD (gastroesophageal reflux disease)   . Heart murmur   . Hepatitis B   . History of blood transfusion    "due to  excessive blood loss before hysterectomy"  . History of urinary tract infection   . Hypertension    currently on no medication   . Kidney stones   . Pneumonia    "4 times in the past year" (10/30/2013)  . Pulmonary emboli ([place]) 09/2013  . Shortness of breath dyspnea    increased exertion;exercise  . Urinary frequency   . Urinary incontinence     Past Surgical History:  Procedure Laterality Date  . ABDOMINAL HYSTERECTOMY    . ANKLE FRACTURE SURGERY Left   . CARPAL TUNNEL RELEASE Left   . DILATION AND CURETTAGE OF UTERUS  X 2  . FRACTURE SURGERY    . HAND SURGERY     1980's/left hand   . INSERTION OF MESH N/A 12/10/2014   Procedure: INSERTION OF MESH;  Surgeon: Michael Boston, MD;  Location: [facility];  Service: General;  Laterality: N/A;  . LAPAROSCOPIC ASSISTED VENTRAL HERNIA REPAIR N/A 12/10/2014   Procedure: LAPAROSCOPIC ASSISTED REPAIR OF INCARCERATED UMBILICAL HERNIA ;  Surgeon: Michael Boston, MD;  Location: [facility];  Service: General;  Laterality: N/A;  . TUBAL LIGATION      Social History:  reports that she has been smoking Cigarettes.  She has a 17.50 pack-year smoking history. She has never used smokeless tobacco. She reports that she does not drink alcohol or use drugs.  Allergies  Allergen Reactions  . Heparin Other (See Comments)    HIT ab  positive, SRA negative  . Amitriptyline Hcl Other (See Comments)     Causes her to be very "disoriented".  . Gabapentin Rash and Other (See Comments)    REACTION: rash in mouth   . Cymbalta [Duloxetine Hcl] Other (See Comments)    headaches  . Neomycin-Bacitracin Zn-Polymyx Rash    Family History  Problem Relation Age of Onset  . Stroke Mother   . Lung cancer Father      Prior to Admission medications   Medication Sig Start Date End Date Taking? Authorizing Provider  albuterol (PROAIR HFA) 108 (90 Base) MCG/ACT inhaler INHALE 2 PUFFS INTO THE LUNGS EVERY 4 HOURS AS NEEDED FOR WHEEZING 12/14/15  Yes [provider]    ALPRAZolam (XANAX) 1 MG tablet Take 0.5 tablets (0.5 mg total) by mouth 2 (two) times daily as needed for anxiety. Patient taking differently: Take 1 mg by mouth 4 (four) times daily.  06/02/15  Yes Elgergawy, Silver Huguenin, MD  budesonide-formoterol (SYMBICORT) 160-4.5 MCG/ACT inhaler Inhale 2 puffs into the lungs 2 (two) times daily.  12/23/15 12/22/16 Yes [provider]  entecavir (BARACLUDE) 0.5 MG tablet Take 0.5 mg by mouth every other day.    Yes [provider]  ibuprofen (ADVIL,MOTRIN) 800 MG tablet Take 1 tablet (800 mg total) by mouth 3 (three) times daily. 11/12/15  Yes de Villier, Daryl F II, PA  Iloperidone (FANAPT) 6 MG TABS Take 12 mg by mouth every morning.    Yes [provider]  Multiple Vitamin (MULTIVITAMIN WITH MINERALS) TABS tablet Take 1 tablet by mouth daily.   Yes [provider]  oxyCODONE-acetaminophen (PERCOCET) 7.5-325 MG tablet Take 1 tablet by mouth 4 (four) times daily.   Yes [provider]  pantoprazole (PROTONIX) 40 MG tablet Take 40 mg by mouth daily. 12/23/13  Yes [provider]  PROAIR HFA 108 (90 Base) MCG/ACT inhaler INHALE 2 PUFFS INTO THE LUNGS EVERY 6 HOURS AS NEEDED FOR WHEEZING OR SHORTNESS OF BREATH. 02/01/15  Yes Rigoberto Noel, MD  rivaroxaban (XARELTO) 20 MG TABS tablet Take 20 mg by mouth daily.   Yes [provider]  spironolactone (ALDACTONE) 50 MG tablet Take 100 mg by mouth every morning. 03/10/15  Yes [provider]  FLUARIX QUADRIVALENT 0.5 ML injection  10/02/15   [provider]  Spacer/Aero-Holding Chambers (AEROCHAMBER MV) inhaler Use as instructed Patient not taking: Reported on 01/01/2016 10/08/14   Rigoberto Noel, MD    Physical Exam: Vitals:   06/09/16 1524 06/09/16 1636  BP: 119/70   Pulse: 80   Resp: (!) 24   Temp:  98 F (36.7 C)     General: Well developed, well nourished, NAD, appears stated age  HEENT: NCAT, PERRLA, EOMI, Anicteic Sclera, mucous  membranes moist.   Neck: Supple, no JVD, no masses  Cardiovascular: S1 S2 auscultated, no rubs, murmurs or gallops. Regular rate and rhythm.  Respiratory: Diffuse expiratory wheezing, occ dry cough  Abdomen: Soft, obese, nontender, nondistended, + bowel sounds  Extremities: warm dry without cyanosis clubbing or edema  Neuro: AAOx3, cranial nerves grossly intact. Strength 5/5 in patient's upper and lower extremities bilaterally  Skin: Without rashes exudates or nodules  Psych: Normal affect and demeanor with intact judgement and insight  Labs on Admission: I have personally reviewed following labs and imaging studies CBC:  Recent Labs Lab 06/09/16 1527  WBC 7.2  NEUTROABS 6.3  HGB 15.6*  HCT 45.3  MCV 90.1  PLT 120*   Basic  Metabolic Panel:  Recent Labs Lab 06/09/16 1430  NA 135  K 3.6  CL 104  CO2 22  GLUCOSE 123*  BUN 20  CREATININE 1.27*  CALCIUM 9.3   GFR: Estimated Creatinine Clearance: 56.1 mL/min (A) (by C-G formula based on SCr of 1.27 mg/dL (H)). Liver Function Tests: No results for input(s): AST, ALT, ALKPHOS, BILITOT, PROT, ALBUMIN in the last 168 hours. No results for input(s): LIPASE, AMYLASE in the last 168 hours. No results for input(s): AMMONIA in the last 168 hours. Coagulation Profile: No results for input(s): INR, PROTIME in the last 168 hours. Cardiac Enzymes: No results for input(s): CKTOTAL, CKMB, CKMBINDEX, TROPONINI in the last 168 hours. BNP (last 3 results) No results for input(s): PROBNP in the last 8760 hours. HbA1C: No results for input(s): HGBA1C in the last 72 hours. CBG: No results for input(s): GLUCAP in the last 168 hours. Lipid Profile: No results for input(s): CHOL, HDL, LDLCALC, TRIG, CHOLHDL, LDLDIRECT in the last 72 hours. Thyroid Function Tests: No results for input(s): TSH, T4TOTAL, FREET4, T3FREE, THYROIDAB in the last 72 hours. Anemia Panel: No results for input(s): VITAMINB12, FOLATE, FERRITIN, TIBC, IRON,  RETICCTPCT in the last 72 hours. Urine analysis:    Component Value Date/Time   COLORURINE YELLOW 06/01/2015 [place] 06/01/2015 0614   LABSPEC 1.022 06/01/2015 0614   PHURINE 6.0 06/01/2015 0614   GLUCOSEU NEGATIVE 06/01/2015 0614   HGBUR NEGATIVE 06/01/2015 0614   BILIRUBINUR NEGATIVE 06/01/2015 0614   KETONESUR NEGATIVE 06/01/2015 0614   PROTEINUR NEGATIVE 06/01/2015 0614   UROBILINOGEN 1.0 04/03/2014 1645   NITRITE NEGATIVE 06/01/2015 0614   LEUKOCYTESUR NEGATIVE 06/01/2015 0614   Sepsis Labs: @LABRCNTIP (procalcitonin:4,lacticidven:4) )No results found for this or any previous visit (from the past 240 hour(s)).   Radiological Exams on Admission: Dg Chest 2 View  Result Date: 06/09/2016 CLINICAL DATA:  61 year old female with shortness of breath, cough and chest pain for 1 week EXAM: CHEST  2 VIEW COMPARISON:  Prior chest x-ray and CT scan of the chest 01/01/2016 FINDINGS: Cardiac and mediastinal contours are unchanged and remain within normal limits. There is an interval increase in interstitial prominence, particularly in both lung bases with streaky linear opacities most consistent with atelectasis. Background chronic bronchitic changes and hyperinflation appear similar. No overt edema, focal airspace consolidation, pleural effusion or pneumothorax. No acute osseous abnormality. IMPRESSION: 1. Slightly increased interstitial prominence with new bibasilar subsegmental atelectasis. Findings are concerning for COPD exacerbation versus viral or atypical respiratory infection. Electronically Signed   By: Jacqulynn Cadet M.D.   On: 06/09/2016 13:51    EKG: Independently reviewed. Sinus rhythm, rate 87  Assessment/Plan  Acute respiratory failure with hypoxia/COPD exacerbation -Likely related to continued smoking -SPO2 87% on room air at rest, during examination, SPO2 fell to 81% on room air -Continue supplemental oxygen to maintain saturations of 92% -Chest x-ray  showed COPD versus atypical respiratory infection -Currently afebrile and the leukocytosis -Patient was given 1 dose of ceftriaxone the emergency department -Will place on azithromycin, solumedrol, nebs -Suspect patient may benefit from home nebulizer at discharge -Of note, patient states that she was seeing a pulmonologist at one point in time. She was taking Spiriva, Advair and Symbicort and seemed to be doing much better. However review was discontinued  Tobacco abuse -Smoking cessation discussed -Nicotine patch  History of DVT/PE -Continue Xarelto  Bipolar disorder/depression/anxiety -Continue Fanapt, Xanax -Patient no longer on Celexa  Chronic kidney disease, stage III -Creatinine appears to be at  baseline  Hepatitis B with liver cirrhosis -Continue entecavir and spironolactone  GERD -Continue PPI  Chronic pain -Continue home regimen of oxycodone  Morbid obesity -Patient will need to follow up with PCP upon discharge to discuss lifestyle modifications  DVT prophylaxis: Xarelto  Code Status: Full  Family Communication: Family at bedside. Admission, patients condition and plan of care including tests being ordered have been discussed with the patient and family who indicate understanding and agree with the plan and Code Status.  Disposition Plan: home when stable   Consults called: None   Admission status: Observation   Time spent: 65 minutes  Calib Wadhwa D.O. [facility] Pager [phone]  If 7PM-7AM, please contact night-coverage [email or web address] Password [email or web address] 06/09/2016, 5:01 PM

## 2016-06-09 NOTE — ED Notes (Signed)
RN starting line and collecting blood work 

## 2016-06-09 NOTE — ED Notes (Signed)
Bed: WA12 Expected date:  Expected time:  Means of arrival:  Comments: EMS-SOB 

## 2016-06-09 NOTE — ED Notes (Signed)
Respiratory paged for neb tx. 

## 2016-06-10 DIAGNOSIS — N183 Chronic kidney disease, stage 3 (moderate): Secondary | ICD-10-CM | POA: Diagnosis not present

## 2016-06-10 DIAGNOSIS — Z720 Tobacco use: Secondary | ICD-10-CM

## 2016-06-10 DIAGNOSIS — N179 Acute kidney failure, unspecified: Secondary | ICD-10-CM

## 2016-06-10 DIAGNOSIS — J441 Chronic obstructive pulmonary disease with (acute) exacerbation: Principal | ICD-10-CM

## 2016-06-10 LAB — CBC
HEMATOCRIT: 41.1 % (ref 36.0–46.0)
Hemoglobin: 14.2 g/dL (ref 12.0–15.0)
MCH: 30.9 pg (ref 26.0–34.0)
MCHC: 34.5 g/dL (ref 30.0–36.0)
MCV: 89.3 fL (ref 78.0–100.0)
Platelets: 124 10*3/uL — ABNORMAL LOW (ref 150–400)
RBC: 4.6 MIL/uL (ref 3.87–5.11)
RDW: 13.3 % (ref 11.5–15.5)
WBC: 10.3 10*3/uL (ref 4.0–10.5)

## 2016-06-10 LAB — URINALYSIS, ROUTINE W REFLEX MICROSCOPIC
Bilirubin Urine: NEGATIVE
Glucose, UA: NEGATIVE mg/dL
Ketones, ur: NEGATIVE mg/dL
Leukocytes, UA: NEGATIVE
Nitrite: NEGATIVE
PH: 6 (ref 5.0–8.0)
Protein, ur: NEGATIVE mg/dL
SPECIFIC GRAVITY, URINE: 1.006 (ref 1.005–1.030)

## 2016-06-10 LAB — HIV ANTIBODY (ROUTINE TESTING W REFLEX): HIV SCREEN 4TH GENERATION: NONREACTIVE

## 2016-06-10 LAB — BASIC METABOLIC PANEL
Anion gap: 13 (ref 5–15)
BUN: 22 mg/dL — AB (ref 6–20)
CO2: 20 mmol/L — ABNORMAL LOW (ref 22–32)
Calcium: 9.8 mg/dL (ref 8.9–10.3)
Chloride: 100 mmol/L — ABNORMAL LOW (ref 101–111)
Creatinine, Ser: 1.54 mg/dL — ABNORMAL HIGH (ref 0.44–1.00)
GFR calc Af Amer: 42 mL/min — ABNORMAL LOW (ref >60)
GFR calc non Af Amer: 36 mL/min — ABNORMAL LOW (ref >60)
Glucose, Bld: 251 mg/dL — ABNORMAL HIGH (ref 65–99)
POTASSIUM: 4 mmol/L (ref 3.5–5.1)
SODIUM: 133 mmol/L — AB (ref 135–145)

## 2016-06-10 MED ORDER — ARFORMOTEROL TARTRATE 15 MCG/2ML IN NEBU
15.0000 ug | INHALATION_SOLUTION | Freq: Two times a day (BID) | RESPIRATORY_TRACT | Status: DC
  Administered 2016-06-10 – 2016-06-11 (×2): 15 ug via RESPIRATORY_TRACT
  Filled 2016-06-10 (×2): qty 2

## 2016-06-10 MED ORDER — OXYCODONE HCL 5 MG PO TABS
5.0000 mg | ORAL_TABLET | Freq: Four times a day (QID) | ORAL | Status: DC | PRN
  Administered 2016-06-10 (×2): 5 mg via ORAL
  Filled 2016-06-10 (×2): qty 1

## 2016-06-10 MED ORDER — SODIUM CHLORIDE 0.9 % IV BOLUS (SEPSIS)
1000.0000 mL | Freq: Once | INTRAVENOUS | Status: AC
  Administered 2016-06-10: 1000 mL via INTRAVENOUS

## 2016-06-10 MED ORDER — NICOTINE 21 MG/24HR TD PT24
21.0000 mg | MEDICATED_PATCH | Freq: Once | TRANSDERMAL | Status: AC
  Administered 2016-06-10: 21 mg via TRANSDERMAL

## 2016-06-10 MED ORDER — TIOTROPIUM BROMIDE MONOHYDRATE 18 MCG IN CAPS
18.0000 ug | ORAL_CAPSULE | Freq: Every day | RESPIRATORY_TRACT | Status: DC
  Administered 2016-06-11: 18 ug via RESPIRATORY_TRACT
  Filled 2016-06-10: qty 5

## 2016-06-10 MED ORDER — BUDESONIDE 0.5 MG/2ML IN SUSP
0.5000 mg | Freq: Two times a day (BID) | RESPIRATORY_TRACT | Status: DC
  Administered 2016-06-10 – 2016-06-11 (×2): 0.5 mg via RESPIRATORY_TRACT
  Filled 2016-06-10 (×2): qty 2

## 2016-06-10 NOTE — Progress Notes (Signed)
Liter bolus given as ordered. Will continue to monitor. SRP, RN

## 2016-06-10 NOTE — Progress Notes (Signed)
PROGRESS NOTE [facility]   Morganne QUANASIA DEFINO   WSF:[id number] DOB: May 09, 1955  DOA: 06/09/2016 PCP: Luetta Nutting, DO   Brief Narrative:  61 year old female with medical history of bipolar disorder, DVT/PE, anxiety, COPD who presented to the emergency department complaining of shortness of breath and increased unproductive cough for 1 week. In the ED she was found to be hypoxemic she was given steroid, magnesium antibiotic with nebulizer treatment. Chest x-ray showed COPD findings versus atypical respiratory infection. Despite treatment patient continued to be hypoxic and she was admitted for further treatment and evaluation.  Subjective: Patient seen and examined, reported breathing is doing much better. She has been ambulating with oxygen. Denies chest pain, palpitation, dizziness or headaches.  Assessment & Plan: Acute respiratory failure with hypoxia secondary to COPD exacerbation Patient currently smoking Continuous oxygen supplementation, she was ambulated on room air but saturation dropped to the 80s, will continue to wean O2 as tolerated. Agree with azithromycin. Continue nebulizers and Solu-Medrol. Will add Spiriva. Will switch to [facility] for Pulmicort and Brovana. Check ambulatory pulse oximetry the morning  Tobacco abuse Smoking cessation discussed Nicotine patch  History of DVT/PE Continue Xarelto  Acute renal failure on CKD stage III Give 1 bolus of MS Hold Aldactone for now  Check creatinine in the morning and  Hepatitis B with liver cirrhosis Continue Entecavir   DVT prophylaxis: Xarelto Code Status: (Full Family Communication: None at bedside Disposition Plan: Home in the next 24-48 hrs. Consultants:   None  Procedures:   None  Antimicrobials: Anti-infectives    Start     Dose/Rate Route Frequency Ordered Stop   06/10/16 1000  entecavir (BARACLUDE) tablet 0.5 mg     0.5 mg Oral Every other day 06/09/16 1845     06/10/16 1000  azithromycin  (ZITHROMAX) tablet 250 mg     250 mg Oral Daily 06/09/16 1845 06/14/16 0959   06/09/16 2000  azithromycin (ZITHROMAX) tablet 500 mg     500 mg Oral  Once 06/09/16 1845 06/09/16 2009   06/09/16 1430  azithromycin (ZITHROMAX) 500 mg in dextrose 5 % 250 mL IVPB  Status:  Discontinued     500 mg 250 mL/hr over 60 Minutes Intravenous  Once 06/09/16 1419 06/09/16 1419   06/09/16 1430  cefTRIAXone (ROCEPHIN) 1 g in dextrose 5 % 50 mL IVPB     1 g 100 mL/hr over 30 Minutes Intravenous  Once 06/09/16 1420 06/09/16 1601       Objective: Vitals:   06/10/16 0416 06/10/16 0500 06/10/16 0904 06/10/16 1206  BP:  (!) 145/73    Pulse:  75    Resp:  (!) 22    Temp:  97.4 F (36.3 C)    TempSrc:  Oral    SpO2: 90% 96% 90% 93%  Weight:      Height:        Intake/Output Summary (Last 24 hours) at 06/10/16 1239 Last data filed at 06/10/16 1034  Gross per 24 hour  Intake              290 ml  Output                0 ml  Net              290 ml   Filed Weights   06/09/16 1329 06/09/16 1849  Weight: 104.3 kg (230 lb) 101.8 kg (224 lb 6.9 oz)    Examination:  General exam: Appears calm and comfortable  HEENT:  AC/AT, PERRLA, OP moist and clear Respiratory system: Decrease air entry bilaterally, expiratory wheeze diffusely, mild rhonchi scattered at the bases Cardiovascular system: S1 & S2 heard, RRR. No JVD, murmurs, rubs or gallops Gastrointestinal system: Abdomen is nondistended, soft and nontender. No organomegaly or masses felt. Normal bowel sounds heard. Central nervous system: Alert and oriented. No focal neurological deficits. Extremities: No pedal edema. Symmetric, strength 5/5   Skin: No rashes, lesions or ulcers Psychiatry: Judgement and insight appear normal. Mood & affect appropriate.    Data Reviewed: I have personally reviewed following labs and imaging studies  CBC:  Recent Labs Lab 06/09/16 1527 06/10/16 0547  WBC 7.2 10.3  NEUTROABS 6.3  --   HGB 15.6* 14.2  HCT  45.3 41.1  MCV 90.1 89.3  PLT 120* 301*   Basic Metabolic Panel:  Recent Labs Lab 06/09/16 1430 06/10/16 0547  NA 135 133*  K 3.6 4.0  CL 104 100*  CO2 22 20*  GLUCOSE 123* 251*  BUN 20 22*  CREATININE 1.27* 1.54*  CALCIUM 9.3 9.8   GFR: Estimated Creatinine Clearance: 45.6 mL/min (A) (by C-G formula based on SCr of 1.54 mg/dL (H)). Liver Function Tests: No results for input(s): AST, ALT, ALKPHOS, BILITOT, PROT, ALBUMIN in the last 168 hours. No results for input(s): LIPASE, AMYLASE in the last 168 hours. No results for input(s): AMMONIA in the last 168 hours. Coagulation Profile: No results for input(s): INR, PROTIME in the last 168 hours. Cardiac Enzymes: No results for input(s): CKTOTAL, CKMB, CKMBINDEX, TROPONINI in the last 168 hours. BNP (last 3 results) No results for input(s): PROBNP in the last 8760 hours. HbA1C: No results for input(s): HGBA1C in the last 72 hours. CBG: No results for input(s): GLUCAP in the last 168 hours. Lipid Profile: No results for input(s): CHOL, HDL, LDLCALC, TRIG, CHOLHDL, LDLDIRECT in the last 72 hours. Thyroid Function Tests: No results for input(s): TSH, T4TOTAL, FREET4, T3FREE, THYROIDAB in the last 72 hours. Anemia Panel: No results for input(s): VITAMINB12, FOLATE, FERRITIN, TIBC, IRON, RETICCTPCT in the last 72 hours. Sepsis Labs: No results for input(s): PROCALCITON, LATICACIDVEN in the last 168 hours.  No results found for this or any previous visit (from the past 240 hour(s)).       Radiology Studies: Dg Chest 2 View  Result Date: 06/09/2016 CLINICAL DATA:  61 year old female with shortness of breath, cough and chest pain for 1 week EXAM: CHEST  2 VIEW COMPARISON:  Prior chest x-ray and CT scan of the chest 01/01/2016 FINDINGS: Cardiac and mediastinal contours are unchanged and remain within normal limits. There is an interval increase in interstitial prominence, particularly in both lung bases with streaky linear  opacities most consistent with atelectasis. Background chronic bronchitic changes and hyperinflation appear similar. No overt edema, focal airspace consolidation, pleural effusion or pneumothorax. No acute osseous abnormality. IMPRESSION: 1. Slightly increased interstitial prominence with new bibasilar subsegmental atelectasis. Findings are concerning for COPD exacerbation versus viral or atypical respiratory infection. Electronically Signed   By: Jacqulynn Cadet M.D.   On: 06/09/2016 13:51      Scheduled Meds: . arformoterol  15 mcg Nebulization BID  . azithromycin  250 mg Oral Daily  . budesonide (PULMICORT) nebulizer solution  0.5 mg Nebulization BID  . entecavir  0.5 mg Oral QODAY  . iloperidone  12 mg Oral Daily  . multivitamin with minerals  1 tablet Oral Daily  . nicotine  21 mg Transdermal Daily  . nicotine  21 mg Transdermal Once  .  nicotine  21 mg Transdermal Once  . oxyCODONE-acetaminophen  1 tablet Oral QID  . pantoprazole  40 mg Oral Daily  . predniSONE  40 mg Oral Q supper  . rivaroxaban  20 mg Oral Daily  . sodium chloride flush  3 mL Intravenous Q12H  . spironolactone  100 mg Oral q morning - 10a  . tiotropium  18 mcg Inhalation Daily   Continuous Infusions: . sodium chloride       LOS: 0 days    Chipper Oman, MD Pager: Text Page via [email or web address]  [phone]  If 7PM-7AM, please contact night-coverage [email or web address] Password [facility] 06/10/2016, 12:39 PM

## 2016-06-11 DIAGNOSIS — N179 Acute kidney failure, unspecified: Secondary | ICD-10-CM | POA: Diagnosis not present

## 2016-06-11 DIAGNOSIS — Z720 Tobacco use: Secondary | ICD-10-CM | POA: Diagnosis not present

## 2016-06-11 DIAGNOSIS — N183 Chronic kidney disease, stage 3 (moderate): Secondary | ICD-10-CM | POA: Diagnosis not present

## 2016-06-11 DIAGNOSIS — J441 Chronic obstructive pulmonary disease with (acute) exacerbation: Secondary | ICD-10-CM | POA: Diagnosis not present

## 2016-06-11 LAB — BASIC METABOLIC PANEL
ANION GAP: 12 (ref 5–15)
BUN: 26 mg/dL — ABNORMAL HIGH (ref 6–20)
CHLORIDE: 102 mmol/L (ref 101–111)
CO2: 21 mmol/L — ABNORMAL LOW (ref 22–32)
CREATININE: 1.37 mg/dL — AB (ref 0.44–1.00)
Calcium: 9.5 mg/dL (ref 8.9–10.3)
GFR calc non Af Amer: 41 mL/min — ABNORMAL LOW (ref >60)
GFR, EST AFRICAN AMERICAN: 48 mL/min — AB (ref >60)
Glucose, Bld: 181 mg/dL — ABNORMAL HIGH (ref 65–99)
POTASSIUM: 4.5 mmol/L (ref 3.5–5.1)
SODIUM: 135 mmol/L (ref 135–145)

## 2016-06-11 MED ORDER — ALBUTEROL SULFATE (2.5 MG/3ML) 0.083% IN NEBU: 2.5000 mg | INHALATION_SOLUTION | RESPIRATORY_TRACT | 0 refills | Status: DC | PRN

## 2016-06-11 MED ORDER — IPRATROPIUM-ALBUTEROL 0.5-2.5 (3) MG/3ML IN SOLN: 3.0000 mL | Freq: Three times a day (TID) | RESPIRATORY_TRACT | Status: DC

## 2016-06-11 MED ORDER — OXYCODONE HCL 5 MG PO TABS
5.0000 mg | ORAL_TABLET | Freq: Once | ORAL | Status: AC
  Administered 2016-06-11: 5 mg via ORAL
  Filled 2016-06-11: qty 1

## 2016-06-11 MED ORDER — OXYCODONE-ACETAMINOPHEN 7.5-325 MG PO TABS: 1.0000 | ORAL_TABLET | Freq: Four times a day (QID) | ORAL | 0 refills | Status: AC | PRN

## 2016-06-11 MED ORDER — NICOTINE 21 MG/24HR TD PT24: 21.0000 mg | MEDICATED_PATCH | Freq: Every day | TRANSDERMAL | 0 refills | Status: DC

## 2016-06-11 MED ORDER — AZITHROMYCIN 250 MG PO TABS: 250.0000 mg | ORAL_TABLET | Freq: Every day | ORAL | 0 refills | Status: AC

## 2016-06-11 MED ORDER — TIOTROPIUM BROMIDE MONOHYDRATE 18 MCG IN CAPS: 18.0000 ug | ORAL_CAPSULE | Freq: Every day | RESPIRATORY_TRACT | 12 refills | Status: DC

## 2016-06-11 MED ORDER — PREDNISONE 10 MG PO TABS: ORAL_TABLET | ORAL | 0 refills | Status: DC

## 2016-06-11 NOTE — Progress Notes (Signed)
Nutrition Brief Note  RD consulted via COPD gold protocol.  Patient's weight is stable with overall weight gain over the past 3 years. Pt has been consuming 100% of meals since admission.  Wt Readings from Last 15 Encounters:  06/09/16 224 lb 6.9 oz (101.8 kg)  01/01/16 230 lb (104.3 kg)  11/11/15 244 lb (110.7 kg)  05/31/15 232 lb 1.6 oz (105.3 kg)  05/28/15 240 lb (108.9 kg)  05/27/15 237 lb 1.6 oz (107.5 kg)  12/10/14 222 lb 8 oz (100.9 kg)  12/04/14 222 lb 8 oz (100.9 kg)  10/07/14 224 lb (101.6 kg)  08/12/14 214 lb (97.1 kg)  12/30/13 199 lb 3.2 oz (90.4 kg)  12/09/13 202 lb 13.2 oz (92 kg)  11/01/13 246 lb 8 oz (111.8 kg)  10/08/13 204 lb 5.9 oz (92.7 kg)  07/14/13 210 lb (95.3 kg)    Body mass index is 38.52 kg/m. Patient meets criteria for obesity based on current BMI.   Current diet order is Regular, patient is consuming approximately 100% of meals at this time. Labs and medications reviewed.   No nutrition interventions warranted at this time. If nutrition issues arise, please consult RD.   Janice Bibles, MS, RD, LDN Pager: [phone] After Hours Pager: [phone]

## 2016-06-11 NOTE — Care Management Note (Signed)
Case Management Note  Patient Details  Name: [name] MRN: [id number] Date of Birth: [birth year]  Subjective/Objective:     COPD exac, CKD, DVT/PE               Action/Plan: Discharge Planning: NCM spoke to pt. She was independent at home. Contacted [facility] DME rep for neb machine for home.   PCP Luetta Nutting MD   Expected Discharge Date:  06/11/16               Expected Discharge Plan:  Home/Self Care  In-House Referral:  NA  Discharge planning Services  CM Consult  Post Acute Care Choice:  NA Choice offered to:  NA  DME Arranged:  Nebulizer machine DME Agency:  [place]:  NA [place] Agency:  NA  Status of Service:  Completed, signed off  If discussed at [place] of Stay Meetings, dates discussed:    Additional Comments:  Erenest Rasher, RN 06/11/2016, 3:47 PM

## 2016-06-11 NOTE — Care Management Obs Status (Signed)
[place] NOTIFICATION   Patient Details  Name: [name] MRN: [id number] Date of Birth: [birth year]   Medicare Observation Status Notification Given:  Yes    Erenest Rasher, RN 06/11/2016, 3:54 PM

## 2016-06-11 NOTE — Progress Notes (Signed)
SATURATION QUALIFICATIONS: (This note is used to comply with regulatory documentation for home oxygen)  Patient Saturations on Room Air at Rest = 92%  Patient Saturations on Room Air while Ambulating = 91-93%  Please briefly explain why patient needs home oxygen:

## 2016-06-11 NOTE — Discharge Summary (Signed)
Physician Discharge Summary  [name]  KLK:[id number]  DOB: 16-Aug-1955  DOA: 06/09/2016 PCP: Luetta Nutting, DO  Admit date: 06/09/2016 Discharge date: 06/11/2016  Admitted From: Home  Disposition:  Home   Recommendations for Outpatient Follow-up:  1. Follow up with PCP in 1-2 weeks 2. Please obtain BMP/CBC in one week to monitor Hgb and Cr   Home Health: None  Equipment/Devices: None    Discharge Condition: Stable  CODE STATUS: FULL  Diet recommendation: Heart Healthy   Brief/Interim Summary: 61 year old female with medical history of bipolar disorder, DVT/PE, anxiety, COPD who presented to the emergency department complaining of shortness of breath and increased unproductive cough for 1 week. In the ED she was found to be hypoxemic she was given steroid, magnesium antibiotic with nebulizer treatment. Chest x-ray showed COPD findings versus atypical respiratory infection. Despite treatment patient continued to be hypoxic and she was admitted for further treatment and evaluation. Patient was treated with Solumedrol and nebs, now with clinically improvement.   Subjective: Patient seen and examined, feeling much better, breathing back to baseline. No acute event overnight. Remains afebrile. Tolerating diet well, ambulating w/o O2 and keeping sat above 92%  Discharge Diagnoses/Hospital Course:  Acute respiratory failure with hypoxia secondary to COPD exacerbation Patient currently smoking Continuous oxygen supplementation, she was ambulated on room air but saturation dropped to the 80s. Patient was treated with IV Solumedrol transitioned to Prednisone will discharge on taper for 14 days. Will continue Spiriva, Dulera, and albuterol. Nebulizer machine was given.  O2 weaned off, ambulatory pulse ox was above 91% and maintaining.   Tobacco abuse Smoking cessation discussed Nicotine patch prescribed   History of DVT/PE Continue Xarelto  Acute renal failure on CKD stage III IVF  was given and Cr and improving  Aldactone was held on admission, can resume home dose on discharge  Check BMP in 1 week   Hepatitis B with liver cirrhosis Continue Entecavir and follow up as outpatient   All other chronic medical condition were stable during the hospitalization.  On the day of the discharge the patient's vitals were stable, and no other acute medical condition were reported by patient. Patient was felt safe to be discharge to home  Discharge Instructions  You were cared for by a hospitalist during your hospital stay. If you have any questions about your discharge medications or the care you received while you were in the hospital after you are discharged, you can call the unit and asked to speak with the hospitalist on call if the hospitalist that took care of you is not available. Once you are discharged, your primary care physician will handle any further medical issues. Please note that NO REFILLS for any discharge medications will be authorized once you are discharged, as it is imperative that you return to your primary care physician (or establish a relationship with a primary care physician if you do not have one) for your aftercare needs so that they can reassess your need for medications and monitor your lab values.  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   Gabapentin Rash, Other (See Comments)   REACTION: rash in mouth   Cymbalta [duloxetine Hcl] Other (See Comments)    headaches   Neomycin-bacitracin Zn-polymyx Rash      Medication List    STOP taking these medications   FLUARIX QUADRIVALENT 0.5 ML injection Generic drug:  Influenza vac split quadrivalent PF   PROAIR HFA 108 (90 Base) MCG/ACT inhaler Generic drug:  albuterol Replaced by:  albuterol (2.5 MG/3ML) 0.083% nebulizer solution You also have another medication with the same name that you need to continue taking as instructed.     TAKE these medications   AEROCHAMBER MV inhaler Use as instructed   ALPRAZolam 1 MG tablet Commonly known as:  XANAX Take 0.5 tablets (0.5 mg total) by mouth 2 (two) times daily as needed for anxiety. What changed:  how much to take  when to take this   azithromycin 250 MG tablet Commonly known as:  ZITHROMAX Take 1 tablet (250 mg total) by mouth daily.   budesonide-formoterol 160-4.5 MCG/ACT inhaler Commonly known as:  SYMBICORT Inhale 2 puffs into the lungs 2 (two) times daily.   entecavir 0.5 MG tablet Commonly known as:  BARACLUDE Take 0.5 mg by mouth every other day.   FANAPT 6 MG Tabs Generic drug:  Iloperidone Take 12 mg by mouth every morning.   ibuprofen 800 MG tablet Commonly known as:  ADVIL,MOTRIN Take 1 tablet (800 mg total) by mouth 3 (three) times daily.   multivitamin with minerals Tabs tablet Take 1 tablet by mouth daily.   nicotine 21 mg/24hr patch Commonly known as:  NICODERM CQ - dosed in mg/24 hours Place 1 patch (21 mg total) onto the skin daily. Start taking on:  06/12/2016 Notes to patient:  Do not smoke with patch on!   oxyCODONE-acetaminophen 7.5-325 MG tablet Commonly known as:  PERCOCET Take 1 tablet by mouth every 6 (six) hours as needed for severe pain. What changed:  when to take this  reasons to take this   pantoprazole 40 MG tablet Commonly known as:  PROTONIX Take 40 mg by mouth daily.   predniSONE 10 MG tablet Commonly known as:  DELTASONE Take 4 tablets for 3 days; Take 3 tablets for 4  days; Take 2 tablets for 3 days; Take 1 tablet for 4 days What changed:  how much to take  how to take this  when to take this  additional instructions   PROAIR HFA 108 (90 Base) MCG/ACT inhaler Generic drug:  albuterol INHALE 2 PUFFS INTO THE LUNGS EVERY 4 HOURS AS NEEDED FOR WHEEZING What changed:  Another medication with the same name was added. Make sure you understand how and when to take each.  Another medication with the same name was removed. Continue taking this medication, and follow the directions you see here.   albuterol (2.5 MG/3ML) 0.083% nebulizer solution Commonly known as:  PROVENTIL Take 3 mLs (2.5 mg total) by nebulization every 4 (four) hours as needed for wheezing or shortness of breath. What changed:  You were already taking a medication with the same name, and this prescription was added. Make sure you understand how and when to take each. Replaces:  PROAIR HFA 108 (90 Base) MCG/ACT inhaler   rivaroxaban 20 MG Tabs tablet Commonly known  as:  XARELTO Take 20 mg by mouth daily.   spironolactone 50 MG tablet Commonly known as:  ALDACTONE Take 100 mg by mouth every morning.   tiotropium 18 MCG inhalation capsule Commonly known as:  SPIRIVA Place 1 capsule (18 mcg total) into inhaler and inhale daily. Start taking on:  06/12/2016            Durable Medical Equipment        Start     Ordered   06/11/16 0000  DME Nebulizer machine    Question:  Patient needs a nebulizer to treat with the following condition  Answer:  COPD exacerbation ([place])   06/11/16 1002   06/11/16 0000  For home use only DME oxygen    Question Answer Comment  Mode or (Route) Mask   Liters per Minute 2   Frequency Continuous (stationary and portable oxygen unit needed)      06/11/16 1002      Allergies  Allergen Reactions  . 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 Amitriptyline Hcl Other (See Comments)     Causes her to be very  "disoriented".  . Gabapentin Rash and Other (See Comments)    REACTION: rash in mouth   . Cymbalta [Duloxetine Hcl] Other (See Comments)    headaches  . Neomycin-Bacitracin Zn-Polymyx Rash    Consultations:  None   Procedures/Studies: Dg Chest 2 View  Result Date: 06/09/2016 CLINICAL DATA:  61 year old female with shortness of breath, cough and chest pain for 1 week EXAM: CHEST  2 VIEW COMPARISON:  Prior chest x-ray and CT scan of the chest 01/01/2016 FINDINGS: Cardiac and mediastinal contours are unchanged and remain within normal limits. There is an interval increase in interstitial prominence, particularly in both lung bases with streaky linear opacities most consistent with atelectasis. Background chronic bronchitic changes and hyperinflation appear similar. No overt edema, focal airspace consolidation, pleural effusion or pneumothorax. No acute osseous abnormality. IMPRESSION: 1. Slightly increased interstitial prominence with new bibasilar subsegmental atelectasis. Findings are concerning for COPD exacerbation versus viral or atypical respiratory infection. Electronically Signed   By: Jacqulynn Cadet M.D.   On: 06/09/2016 13:51     Discharge Exam: Vitals:   06/11/16 0511 06/11/16 1300  BP: (!) 146/86 140/84  Pulse: 70 74  Resp: (!) 22 (!) 21  Temp: 98.3 F (36.8 C) 98.4 F (36.9 C)   Vitals:   06/11/16 0511 06/11/16 0537 06/11/16 0835 06/11/16 1300  BP: (!) 146/86   140/84  Pulse: 70   74  Resp: (!) 22   (!) 21  Temp: 98.3 F (36.8 C)   98.4 F (36.9 C)  TempSrc: Oral   Oral  SpO2: 95% 94% 97% 93%  Weight:      Height:        General: Pt is alert, awake, not in acute distress Cardiovascular: RRR, S1/S2 +, no rubs, no gallops Respiratory: Good air entry, diffuse scattered rhonchi and mild expiratory wheezing Abdominal: Soft, NT, ND, bowel sounds + Extremities: no edema, no cyanosis   The results of significant diagnostics from this hospitalization (including  imaging, microbiology, ancillary and laboratory) are listed below for reference.     Microbiology: No results found for this or any previous visit (from the past 240 hour(s)).   Labs: BNP (last 3 results)  Recent Labs  06/09/16 1430  BNP 30.8   Basic Metabolic Panel:  Recent Labs Lab 06/09/16 1430 06/10/16 0547 06/11/16 0530  NA 135 133* 135  K 3.6 4.0 4.5  CL 104 100* 102  CO2 22 20* 21*  GLUCOSE 123* 251* 181*  BUN 20 22* 26*  CREATININE 1.27* 1.54* 1.37*  CALCIUM 9.3 9.8 9.5   CBC:  Recent Labs Lab 06/09/16 1527 06/10/16 0547  WBC 7.2 10.3  NEUTROABS 6.3  --   HGB 15.6* 14.2  HCT 45.3 41.1  MCV 90.1 89.3  PLT 120* 124*   Anemia work up No results for input(s): VITAMINB12, FOLATE, FERRITIN, TIBC, IRON, RETICCTPCT in the last 72 hours. Urinalysis    Component Value Date/Time   COLORURINE STRAW (A) 06/10/2016 2148   APPEARANCEUR CLEAR 06/10/2016 2148   LABSPEC 1.006 06/10/2016 2148   PHURINE 6.0 06/10/2016 2148   GLUCOSEU NEGATIVE 06/10/2016 2148   HGBUR LARGE (A) 06/10/2016 2148   BILIRUBINUR NEGATIVE 06/10/2016 2148   KETONESUR NEGATIVE 06/10/2016 2148   PROTEINUR NEGATIVE 06/10/2016 2148   UROBILINOGEN 1.0 04/03/2014 1645   NITRITE NEGATIVE 06/10/2016 2148   LEUKOCYTESUR NEGATIVE 06/10/2016 2148   Sepsis Labs Invalid input(s): PROCALCITONIN,  WBC,  LACTICIDVEN Microbiology No results found for this or any previous visit (from the past 240 hour(s)).   Time coordinating discharge: 35 minutes  SIGNED:  Chipper Oman, MD  [facility] 06/11/2016, 1:30 PM  Pager please text page via  [email or web address] Password [email or web address]

## 2016-06-17 ENCOUNTER — Encounter (HOSPITAL_COMMUNITY): Payer: Self-pay | Admitting: Emergency Medicine

## 2016-06-17 ENCOUNTER — Emergency Department (HOSPITAL_COMMUNITY): Payer: Medicare Other

## 2016-06-17 ENCOUNTER — Inpatient Hospital Stay (HOSPITAL_COMMUNITY)
Admission: EM | Admit: 2016-06-17 | Discharge: 2016-06-23 | DRG: 190 | Disposition: A | Discharge status: Home / Self Care | Payer: Medicare Other | Attending: Internal Medicine | Admitting: Internal Medicine

## 2016-06-17 DIAGNOSIS — K219 Gastro-esophageal reflux disease without esophagitis: Secondary | ICD-10-CM | POA: Diagnosis present

## 2016-06-17 DIAGNOSIS — F419 Anxiety disorder, unspecified: Secondary | ICD-10-CM | POA: Diagnosis present

## 2016-06-17 DIAGNOSIS — Z86718 Personal history of other venous thrombosis and embolism: Secondary | ICD-10-CM

## 2016-06-17 DIAGNOSIS — N183 Chronic kidney disease, stage 3 (moderate): Secondary | ICD-10-CM | POA: Diagnosis present

## 2016-06-17 DIAGNOSIS — R011 Cardiac murmur, unspecified: Secondary | ICD-10-CM | POA: Diagnosis present

## 2016-06-17 DIAGNOSIS — I2699 Other pulmonary embolism without acute cor pulmonale: Secondary | ICD-10-CM | POA: Diagnosis not present

## 2016-06-17 DIAGNOSIS — Z7901 Long term (current) use of anticoagulants: Secondary | ICD-10-CM | POA: Diagnosis not present

## 2016-06-17 DIAGNOSIS — I129 Hypertensive chronic kidney disease with stage 1 through stage 4 chronic kidney disease, or unspecified chronic kidney disease: Secondary | ICD-10-CM | POA: Diagnosis present

## 2016-06-17 DIAGNOSIS — Z720 Tobacco use: Secondary | ICD-10-CM | POA: Diagnosis present

## 2016-06-17 DIAGNOSIS — Z7951 Long term (current) use of inhaled steroids: Secondary | ICD-10-CM

## 2016-06-17 DIAGNOSIS — Z90710 Acquired absence of both cervix and uterus: Secondary | ICD-10-CM

## 2016-06-17 DIAGNOSIS — K7460 Unspecified cirrhosis of liver: Secondary | ICD-10-CM | POA: Diagnosis present

## 2016-06-17 DIAGNOSIS — Z7952 Long term (current) use of systemic steroids: Secondary | ICD-10-CM

## 2016-06-17 DIAGNOSIS — I10 Essential (primary) hypertension: Secondary | ICD-10-CM | POA: Diagnosis not present

## 2016-06-17 DIAGNOSIS — Z881 Allergy status to other antibiotic agents status: Secondary | ICD-10-CM

## 2016-06-17 DIAGNOSIS — G8929 Other chronic pain: Secondary | ICD-10-CM | POA: Diagnosis present

## 2016-06-17 DIAGNOSIS — Z86711 Personal history of pulmonary embolism: Secondary | ICD-10-CM

## 2016-06-17 DIAGNOSIS — Z888 Allergy status to other drugs, medicaments and biological substances status: Secondary | ICD-10-CM

## 2016-06-17 DIAGNOSIS — F17210 Nicotine dependence, cigarettes, uncomplicated: Secondary | ICD-10-CM | POA: Diagnosis present

## 2016-06-17 DIAGNOSIS — B1910 Unspecified viral hepatitis B without hepatic coma: Secondary | ICD-10-CM | POA: Diagnosis present

## 2016-06-17 DIAGNOSIS — Z79899 Other long term (current) drug therapy: Secondary | ICD-10-CM | POA: Diagnosis not present

## 2016-06-17 DIAGNOSIS — E875 Hyperkalemia: Secondary | ICD-10-CM | POA: Diagnosis present

## 2016-06-17 DIAGNOSIS — J9601 Acute respiratory failure with hypoxia: Secondary | ICD-10-CM | POA: Diagnosis present

## 2016-06-17 DIAGNOSIS — I2782 Chronic pulmonary embolism: Secondary | ICD-10-CM | POA: Diagnosis not present

## 2016-06-17 DIAGNOSIS — D696 Thrombocytopenia, unspecified: Secondary | ICD-10-CM | POA: Diagnosis present

## 2016-06-17 DIAGNOSIS — Z79891 Long term (current) use of opiate analgesic: Secondary | ICD-10-CM

## 2016-06-17 DIAGNOSIS — J441 Chronic obstructive pulmonary disease with (acute) exacerbation: Principal | ICD-10-CM | POA: Diagnosis present

## 2016-06-17 DIAGNOSIS — F329 Major depressive disorder, single episode, unspecified: Secondary | ICD-10-CM | POA: Diagnosis present

## 2016-06-17 DIAGNOSIS — B181 Chronic viral hepatitis B without delta-agent: Secondary | ICD-10-CM | POA: Diagnosis present

## 2016-06-17 DIAGNOSIS — Z8701 Personal history of pneumonia (recurrent): Secondary | ICD-10-CM

## 2016-06-17 LAB — POTASSIUM: Potassium: 4.5 mmol/L (ref 3.5–5.1)

## 2016-06-17 LAB — CBC WITH DIFFERENTIAL/PLATELET
BASOS ABS: 0 10*3/uL (ref 0.0–0.1)
Basophils Relative: 0 %
EOS ABS: 0 10*3/uL (ref 0.0–0.7)
Eosinophils Relative: 0 %
HCT: 43 % (ref 36.0–46.0)
HEMOGLOBIN: 14.4 g/dL (ref 12.0–15.0)
Lymphocytes Relative: 5 %
Lymphs Abs: 0.3 10*3/uL — ABNORMAL LOW (ref 0.7–4.0)
MCH: 30.8 pg (ref 26.0–34.0)
MCHC: 33.5 g/dL (ref 30.0–36.0)
MCV: 91.9 fL (ref 78.0–100.0)
Monocytes Absolute: 0.1 10*3/uL (ref 0.1–1.0)
Monocytes Relative: 1 %
NEUTROS PCT: 94 %
Neutro Abs: 6 10*3/uL (ref 1.7–7.7)
PLATELETS: 94 10*3/uL — AB (ref 150–400)
RBC: 4.68 MIL/uL (ref 3.87–5.11)
RDW: 14 % (ref 11.5–15.5)
WBC: 6.4 10*3/uL (ref 4.0–10.5)

## 2016-06-17 LAB — BASIC METABOLIC PANEL
ANION GAP: 11 (ref 5–15)
BUN: 37 mg/dL — AB (ref 6–20)
CO2: 20 mmol/L — ABNORMAL LOW (ref 22–32)
Calcium: 9.1 mg/dL (ref 8.9–10.3)
Chloride: 105 mmol/L (ref 101–111)
Creatinine, Ser: 1.7 mg/dL — ABNORMAL HIGH (ref 0.44–1.00)
GFR, EST AFRICAN AMERICAN: 37 mL/min — AB (ref >60)
GFR, EST NON AFRICAN AMERICAN: 32 mL/min — AB (ref >60)
Glucose, Bld: 336 mg/dL — ABNORMAL HIGH (ref 65–99)
POTASSIUM: 4.7 mmol/L (ref 3.5–5.1)
SODIUM: 136 mmol/L (ref 135–145)

## 2016-06-17 LAB — I-STAT CHEM 8, ED
BUN: 56 mg/dL — ABNORMAL HIGH (ref 6–20)
Calcium, Ion: 1.13 mmol/L — ABNORMAL LOW (ref 1.15–1.40)
Chloride: 106 mmol/L (ref 101–111)
Creatinine, Ser: 1.4 mg/dL — ABNORMAL HIGH (ref 0.44–1.00)
GLUCOSE: 174 mg/dL — AB (ref 65–99)
HCT: 45 % (ref 36.0–46.0)
HEMOGLOBIN: 15.3 g/dL — AB (ref 12.0–15.0)
POTASSIUM: 5.9 mmol/L — AB (ref 3.5–5.1)
SODIUM: 136 mmol/L (ref 135–145)
TCO2: 27 mmol/L (ref 0–100)

## 2016-06-17 LAB — GLUCOSE, CAPILLARY: GLUCOSE-CAPILLARY: 258 mg/dL — AB (ref 65–99)

## 2016-06-17 MED ORDER — METHYLPREDNISOLONE SODIUM SUCC 125 MG IJ SOLR: 125.0000 mg | Freq: Once | INTRAMUSCULAR | Status: DC

## 2016-06-17 MED ORDER — IPRATROPIUM-ALBUTEROL 0.5-2.5 (3) MG/3ML IN SOLN
3.0000 mL | Freq: Four times a day (QID) | RESPIRATORY_TRACT | Status: DC
  Administered 2016-06-17 (×2): 3 mL via RESPIRATORY_TRACT
  Filled 2016-06-17 (×2): qty 3

## 2016-06-17 MED ORDER — LORATADINE 10 MG PO TABS
10.0000 mg | ORAL_TABLET | Freq: Every day | ORAL | Status: DC
  Administered 2016-06-17 – 2016-06-23 (×7): 10 mg via ORAL
  Filled 2016-06-17 (×7): qty 1

## 2016-06-17 MED ORDER — IPRATROPIUM BROMIDE 0.02 % IN SOLN
0.5000 mg | Freq: Once | RESPIRATORY_TRACT | Status: AC
  Administered 2016-06-17: 0.5 mg via RESPIRATORY_TRACT
  Filled 2016-06-17: qty 2.5

## 2016-06-17 MED ORDER — ALBUTEROL (5 MG/ML) CONTINUOUS INHALATION SOLN
10.0000 mg/h | INHALATION_SOLUTION | Freq: Once | RESPIRATORY_TRACT | Status: AC
  Administered 2016-06-17: 10 mg/h via RESPIRATORY_TRACT
  Filled 2016-06-17: qty 20

## 2016-06-17 MED ORDER — BUDESONIDE 0.25 MG/2ML IN SUSP
0.2500 mg | Freq: Two times a day (BID) | RESPIRATORY_TRACT | Status: DC
  Administered 2016-06-17 – 2016-06-23 (×12): 0.25 mg via RESPIRATORY_TRACT
  Filled 2016-06-17 (×13): qty 2

## 2016-06-17 MED ORDER — METHYLPREDNISOLONE SODIUM SUCC 125 MG IJ SOLR
60.0000 mg | Freq: Three times a day (TID) | INTRAMUSCULAR | Status: DC
  Administered 2016-06-17 – 2016-06-18 (×3): 60 mg via INTRAVENOUS
  Filled 2016-06-17 (×3): qty 2

## 2016-06-17 MED ORDER — IBUPROFEN 200 MG PO TABS
600.0000 mg | ORAL_TABLET | Freq: Once | ORAL | Status: AC
  Administered 2016-06-17: 600 mg via ORAL
  Filled 2016-06-17: qty 3

## 2016-06-17 MED ORDER — SODIUM CHLORIDE 0.9% FLUSH
3.0000 mL | INTRAVENOUS | Status: DC | PRN
  Administered 2016-06-19: 3 mL via INTRAVENOUS
  Filled 2016-06-17: qty 3

## 2016-06-17 MED ORDER — LURASIDONE HCL 40 MG PO TABS
40.0000 mg | ORAL_TABLET | Freq: Every day | ORAL | Status: DC
  Administered 2016-06-17 – 2016-06-23 (×7): 40 mg via ORAL
  Filled 2016-06-17 (×7): qty 1

## 2016-06-17 MED ORDER — SODIUM CHLORIDE 0.9% FLUSH
3.0000 mL | Freq: Two times a day (BID) | INTRAVENOUS | Status: DC
  Administered 2016-06-17 – 2016-06-23 (×11): 3 mL via INTRAVENOUS

## 2016-06-17 MED ORDER — ACETAMINOPHEN 325 MG PO TABS
650.0000 mg | ORAL_TABLET | Freq: Four times a day (QID) | ORAL | Status: DC | PRN
  Filled 2016-06-17: qty 2

## 2016-06-17 MED ORDER — ONDANSETRON HCL 4 MG/2ML IJ SOLN: 4.0000 mg | Freq: Four times a day (QID) | INTRAMUSCULAR | Status: DC | PRN

## 2016-06-17 MED ORDER — OXYCODONE-ACETAMINOPHEN 5-325 MG PO TABS
1.0000 | ORAL_TABLET | Freq: Once | ORAL | Status: AC
  Administered 2016-06-17: 1 via ORAL
  Filled 2016-06-17: qty 1

## 2016-06-17 MED ORDER — INSULIN ASPART 100 UNIT/ML ~~LOC~~ SOLN
0.0000 [IU] | Freq: Three times a day (TID) | SUBCUTANEOUS | Status: DC
  Administered 2016-06-18: 3 [IU] via SUBCUTANEOUS
  Administered 2016-06-18 – 2016-06-19 (×2): 1 [IU] via SUBCUTANEOUS
  Administered 2016-06-19: 2 [IU] via SUBCUTANEOUS
  Administered 2016-06-19: 1 [IU] via SUBCUTANEOUS
  Administered 2016-06-20 (×2): 2 [IU] via SUBCUTANEOUS
  Administered 2016-06-20 – 2016-06-21 (×2): 1 [IU] via SUBCUTANEOUS
  Administered 2016-06-21: 3 [IU] via SUBCUTANEOUS
  Administered 2016-06-21 – 2016-06-22 (×2): 2 [IU] via SUBCUTANEOUS
  Administered 2016-06-22: 1 [IU] via SUBCUTANEOUS
  Administered 2016-06-22: 3 [IU] via SUBCUTANEOUS
  Administered 2016-06-23: 2 [IU] via SUBCUTANEOUS

## 2016-06-17 MED ORDER — IPRATROPIUM-ALBUTEROL 0.5-2.5 (3) MG/3ML IN SOLN
3.0000 mL | Freq: Three times a day (TID) | RESPIRATORY_TRACT | Status: DC
  Administered 2016-06-18 (×3): 3 mL via RESPIRATORY_TRACT
  Filled 2016-06-17 (×3): qty 3

## 2016-06-17 MED ORDER — SODIUM POLYSTYRENE SULFONATE 15 GM/60ML PO SUSP: 30.0000 g | Freq: Once | ORAL | Status: DC

## 2016-06-17 MED ORDER — RIVAROXABAN 20 MG PO TABS
20.0000 mg | ORAL_TABLET | Freq: Every day | ORAL | Status: DC
  Administered 2016-06-17 – 2016-06-18 (×2): 20 mg via ORAL
  Filled 2016-06-17 (×2): qty 1

## 2016-06-17 MED ORDER — PANTOPRAZOLE SODIUM 40 MG PO TBEC
40.0000 mg | DELAYED_RELEASE_TABLET | Freq: Every day | ORAL | Status: DC
  Administered 2016-06-17 – 2016-06-23 (×7): 40 mg via ORAL
  Filled 2016-06-17 (×7): qty 1

## 2016-06-17 MED ORDER — OXYCODONE-ACETAMINOPHEN 7.5-325 MG PO TABS
1.0000 | ORAL_TABLET | Freq: Four times a day (QID) | ORAL | Status: DC | PRN
  Administered 2016-06-17 – 2016-06-23 (×22): 1 via ORAL
  Filled 2016-06-17 (×22): qty 1

## 2016-06-17 MED ORDER — ACETAMINOPHEN 650 MG RE SUPP: 650.0000 mg | Freq: Four times a day (QID) | RECTAL | Status: DC | PRN

## 2016-06-17 MED ORDER — SODIUM CHLORIDE 0.9 % IV SOLN: 250.0000 mL | INTRAVENOUS | Status: DC | PRN

## 2016-06-17 MED ORDER — OXYMETAZOLINE HCL 0.05 % NA SOLN: 1.0000 | Freq: Two times a day (BID) | NASAL | Status: DC | PRN

## 2016-06-17 MED ORDER — ALBUTEROL SULFATE (2.5 MG/3ML) 0.083% IN NEBU: 2.5000 mg | INHALATION_SOLUTION | RESPIRATORY_TRACT | Status: DC | PRN

## 2016-06-17 MED ORDER — FLUTICASONE PROPIONATE 50 MCG/ACT NA SUSP
2.0000 | Freq: Every day | NASAL | Status: DC
  Administered 2016-06-17 – 2016-06-23 (×7): 2 via NASAL
  Filled 2016-06-17: qty 16

## 2016-06-17 MED ORDER — ONDANSETRON HCL 4 MG PO TABS
4.0000 mg | ORAL_TABLET | Freq: Four times a day (QID) | ORAL | Status: DC | PRN
  Administered 2016-06-22: 4 mg via ORAL
  Filled 2016-06-17: qty 1

## 2016-06-17 MED ORDER — ALPRAZOLAM 0.5 MG PO TABS
0.5000 mg | ORAL_TABLET | Freq: Two times a day (BID) | ORAL | Status: DC | PRN
  Administered 2016-06-17 – 2016-06-23 (×12): 0.5 mg via ORAL
  Filled 2016-06-17 (×12): qty 1

## 2016-06-17 MED ORDER — LAMOTRIGINE 100 MG PO TABS
100.0000 mg | ORAL_TABLET | Freq: Every morning | ORAL | Status: DC
  Administered 2016-06-17 – 2016-06-23 (×7): 100 mg via ORAL
  Filled 2016-06-17 (×7): qty 1

## 2016-06-17 MED ORDER — ALBUTEROL SULFATE (2.5 MG/3ML) 0.083% IN NEBU
5.0000 mg | INHALATION_SOLUTION | Freq: Once | RESPIRATORY_TRACT | Status: AC
  Administered 2016-06-17: 5 mg via RESPIRATORY_TRACT
  Filled 2016-06-17: qty 6

## 2016-06-17 MED ORDER — VILAZODONE HCL 20 MG PO TABS
20.0000 mg | ORAL_TABLET | Freq: Every day | ORAL | Status: DC
  Administered 2016-06-17 – 2016-06-23 (×7): 20 mg via ORAL
  Filled 2016-06-17 (×17): qty 1

## 2016-06-17 MED ORDER — GUAIFENESIN ER 600 MG PO TB12
600.0000 mg | ORAL_TABLET | Freq: Two times a day (BID) | ORAL | Status: DC
  Administered 2016-06-17 – 2016-06-23 (×12): 600 mg via ORAL
  Filled 2016-06-17 (×12): qty 1

## 2016-06-17 MED ORDER — POLYETHYLENE GLYCOL 3350 17 G PO PACK
17.0000 g | PACK | Freq: Every day | ORAL | Status: DC | PRN
  Administered 2016-06-18 – 2016-06-20 (×3): 17 g via ORAL
  Filled 2016-06-17 (×3): qty 1

## 2016-06-17 MED ORDER — ENTECAVIR 0.5 MG PO TABS
0.5000 mg | ORAL_TABLET | ORAL | Status: DC
Start: 2016-06-18 — End: 2016-06-23

## 2016-06-17 MED ORDER — VILAZODONE HCL 10 & 20 MG PO KIT: 20.0000 mg | PACK | ORAL | Status: DC

## 2016-06-17 NOTE — ED Notes (Signed)
Pt is getting breathing treatment will check back to walk PT for o2

## 2016-06-17 NOTE — ED Provider Notes (Addendum)
[facility] DEPT Provider Note   CSN: [id number] Arrival date & time: 06/17/16  0441  Time seen 05:50 AM   History   Chief Complaint Chief Complaint  Patient presents with  . Shortness of Breath    HPI [name] is a 61 y.o. female.  HPI  patient states she has a history of COPD, she was just admitted to the hospital from May 18 to the 21st. She states she was told she had a lung infection. She states she started getting worse a couple days ago. She states she's having a dry cough without fever although she does describe chills. Her chest feels tight and when she coughs it hurts in the center of her chest. She has a sore throat when she coughs. She denies rhinorrhea but she does describe sneezing and watery eyes. She denies nausea, vomiting, diarrhea. She states she ran out of her medications for her nebulizer but she has been using her pro-air, Symbicort, and Spiriva inhalers. She states they just are not lasting as long as usual. She states she finished her prednisone and her Z-Pak. She states there was some discussion about sending her home on oxygen however she was not.  PCP Rich Fuchs, PA   Past Medical History:  Diagnosis Date  . Agoraphobia with panic attacks   . Altered mental state 10/26/2013  . Ankle fracture, left 11/27/2010   S/p ORIF 11/2   . Anxiety   . Arthritis    "knees; lower back" (10/30/2013)  . Bipolar disorder ([place])   . Cavitary pneumonia   . Cirrhosis ([place])   . CKD (chronic kidney disease), stage III   . COPD (chronic obstructive pulmonary disease) ([place])   . Depression   . DVT (deep venous thrombosis) ([place]) 09/2013   RLE  . GERD (gastroesophageal reflux disease)   . Heart murmur   . Hepatitis B   . History of blood transfusion    "due to excessive blood loss before hysterectomy"  . History of urinary tract infection   . Hypertension    currently on no medication   . Kidney stones   . Pneumonia    "4 times in the past year" (10/30/2013)    . Pulmonary emboli ([place]) 09/2013  . Shortness of breath dyspnea    increased exertion;exercise  . Urinary frequency   . Urinary incontinence     Patient Active Problem List   Diagnosis Date Noted  . COPD exacerbation ([place]) 06/09/2016  . Right lower lobe pneumonia ([place]) 05/31/2015  . Right upper lobe pneumonia ([place]) 05/31/2015  . CAP (community acquired pneumonia) 05/31/2015  . Thrombocytopenia ([place]) 05/31/2015  . UTI (lower urinary tract infection) 05/26/2015  . Right flank pain   . Incarcerated incisional hernia s/p lap repair w mesh 12/10/2014 12/10/2014  . Cavitary lesion of lung 12/08/2013  . DVT (deep venous thrombosis) ([place]) 10/31/2013  . PE (pulmonary embolism) 10/26/2013  . Kidney stone 10/04/2013  . Tobacco abuse 10/04/2013  . COPD (chronic obstructive pulmonary disease) ([place])   . Unspecified constipation 12/23/2012  . Steroid-induced hyperglycemia 12/22/2012  . CKD (chronic kidney disease), stage III 12/21/2012  . Sepsis ([place]) 12/21/2012  . Cocaine abuse 11/27/2010  . Obesity 09/01/2008  . Hepatitis B virus infection 08/31/2008  . Acute hepatitis C virus infection 08/31/2008  . Iron deficiency anemia 08/31/2008  . Anxiety state 08/31/2008  . Essential hypertension 08/31/2008  . Coronary atherosclerosis 08/31/2008  . Hepatic cirrhosis ([place]) 08/31/2008    Past Surgical History:  Procedure Laterality Date  . ABDOMINAL HYSTERECTOMY    . ANKLE FRACTURE SURGERY Left   . CARPAL TUNNEL RELEASE Left   . DILATION AND CURETTAGE OF UTERUS  X 2  . FRACTURE SURGERY    . HAND SURGERY     1980's/left hand   . INSERTION OF MESH N/A 12/10/2014   Procedure: INSERTION OF MESH;  Surgeon: Michael Boston, MD;  Location: [facility] ORS;  Service: General;  Laterality: N/A;  . LAPAROSCOPIC ASSISTED VENTRAL HERNIA REPAIR N/A 12/10/2014   Procedure: LAPAROSCOPIC ASSISTED REPAIR OF INCARCERATED UMBILICAL HERNIA ;  Surgeon: Michael Boston, MD;  Location: [facility] OR[facility];  Service: General;  Laterality:  N/A;  . TUBAL LIGATION      OB History    No data available       Home Medications    Prior to Admission medications   Medication Sig Start Date End Date Taking? Authorizing Provider  albuterol (PROAIR HFA) 108 (90 Base) MCG/ACT inhaler INHALE 2 PUFFS INTO THE LUNGS EVERY 4 HOURS AS NEEDED FOR WHEEZING 12/14/15   [provider]  albuterol (PROVENTIL) (2.5 MG/3ML) 0.083% nebulizer solution Take 3 mLs (2.5 mg total) by nebulization every 4 (four) hours as needed for wheezing or shortness of breath. 06/11/16   Patrecia Pour, Christean Grief, MD  ALPRAZolam Duanne Moron) 1 MG tablet Take 0.5 tablets (0.5 mg total) by mouth 2 (two) times daily as needed for anxiety. Patient taking differently: Take 1 mg by mouth 4 (four) times daily.  06/02/15   Elgergawy, Silver Huguenin, MD  budesonide-formoterol (SYMBICORT) 160-4.5 MCG/ACT inhaler Inhale 2 puffs into the lungs 2 (two) times daily.  12/23/15 12/22/16  [provider]  entecavir (BARACLUDE) 0.5 MG tablet Take 0.5 mg by mouth every other day.     [provider]  ibuprofen (ADVIL,MOTRIN) 800 MG tablet Take 1 tablet (800 mg total) by mouth 3 (three) times daily. 11/12/15   de Villier, Daryl F II, PA  Iloperidone (FANAPT) 6 MG TABS Take 12 mg by mouth every morning.     [provider]  Multiple Vitamin (MULTIVITAMIN WITH MINERALS) TABS tablet Take 1 tablet by mouth daily.    [provider]  nicotine (NICODERM CQ - DOSED IN MG/24 HOURS) 21 mg/24hr patch Place 1 patch (21 mg total) onto the skin daily. 06/12/16   Patrecia Pour, Christean Grief, MD  pantoprazole (PROTONIX) 40 MG tablet Take 40 mg by mouth daily. 12/23/13   [provider]  predniSONE (DELTASONE) 10 MG tablet Take 4 tablets for 3 days; Take 3 tablets for 4 days; Take 2 tablets for 3 days; Take 1 tablet for 4 days 06/11/16   Patrecia Pour, Christean Grief, MD  rivaroxaban (XARELTO) 20 MG TABS tablet Take 20 mg by mouth daily.    [provider]  Spacer/Aero-Holding  Chambers (AEROCHAMBER MV) inhaler Use as instructed Patient not taking: Reported on 01/01/2016 10/08/14   Rigoberto Noel, MD  spironolactone (ALDACTONE) 50 MG tablet Take 100 mg by mouth every morning. 03/10/15   [provider]  tiotropium (SPIRIVA) 18 MCG inhalation capsule Place 1 capsule (18 mcg total) into inhaler and inhale daily. 06/12/16   Doreatha Lew, MD    Family History Family History  Problem Relation Age of Onset  . Stroke Mother   . Lung cancer Father     Social History Social History  Substance Use Topics  . Smoking status: Current Every Day Smoker    Packs/day: 0.50    Years: 35.00    Types:  Cigarettes    Last attempt to quit: 10/26/2013  . Smokeless tobacco: Never Used  . Alcohol use No     Comment: "stopped drinking  in 2004"  lives at home Smokes 1 1/2 PPD, but quit on the 18th On disability for cirrhosis and hepatitis B   Allergies   Heparin; Amitriptyline hcl; Gabapentin; Cymbalta [duloxetine hcl]; and Neomycin-bacitracin zn-polymyx   Review of Systems Review of Systems  All other systems reviewed and are negative.    Physical Exam Updated Vital Signs BP 128/79 (BP Location: Right Arm)   Pulse 70   Resp 20   SpO2 92%   Vital signs normal    Physical Exam  Constitutional: She is oriented to person, place, and time. She appears well-developed and well-nourished.  Non-toxic appearance. She does not appear ill. No distress.  HENT:  Head: Normocephalic and atraumatic.  Right Ear: External ear normal.  Left Ear: External ear normal.  Nose: Nose normal. No mucosal edema or rhinorrhea.  Mouth/Throat: Oropharynx is clear and moist and mucous membranes are normal. No dental abscesses or uvula swelling.  Eyes: Conjunctivae and EOM are normal. Pupils are equal, round, and reactive to light.  Neck: Normal range of motion and full passive range of motion without pain. Neck supple.  Cardiovascular: Normal rate, regular rhythm and normal  heart sounds.  Exam reveals no gallop and no friction rub.   No murmur heard. Pulmonary/Chest: Effort normal. No respiratory distress. She has decreased breath sounds. She has no wheezes. She has rhonchi. She has no rales. She exhibits no tenderness and no crepitus.  Rare rhonchi  Abdominal: Soft. Normal appearance and bowel sounds are normal. She exhibits no distension. There is no tenderness. There is no rebound and no guarding.  Musculoskeletal: Normal range of motion. She exhibits no edema or tenderness.  Moves all extremities well.   Neurological: She is alert and oriented to person, place, and time. She has normal strength. No cranial nerve deficit.  Skin: Skin is warm, dry and intact. No rash noted. No erythema. No pallor.  Psychiatric: She has a normal mood and affect. Her speech is normal and behavior is normal. Her mood appears not anxious.  Nursing note and vitals reviewed.    ED Treatments / Results  Labs (all labs ordered are listed, but only abnormal results are displayed)     EKG  EKG Interpretation  Date/Time:  Saturday Jun 17 2016 06:12:03 EDT Ventricular Rate:  71 PR Interval:    QRS Duration: 96 QT Interval:  388 QTC Calculation: 422 R Axis:   -10 Text Interpretation:  Sinus rhythm Left ventricular hypertrophy Anterior ST elevation, probably due to LVH No significant change since last tracing 09 Jun 2016 Confirmed by Rolland Porter [phone]) on 06/17/2016 7:25:36 AM       Radiology Dg Chest 2 View  Result Date: 06/17/2016 CLINICAL DATA:  Shortness of breath. Patient was recently hospitalized for respiratory infection (discharged home last Sunday). Patient states her symptoms are not getting better. Hx of COPD, PE and DVT, heart murmur, hypertension, pneumonia. Smoker x 35 years EXAM: CHEST  2 VIEW COMPARISON:  06/09/2016 FINDINGS: Shallow inspiration with linear atelectasis in the lung bases. No airspace disease in the lungs. No blunting of costophrenic angles. No  pneumothorax. Normal heart size and pulmonary vascularity. Tortuous aorta. Degenerative changes in the spine. IMPRESSION: Shallow inspiration with atelectasis in the lung bases. Electronically Signed   By: Lucienne Capers M.D.   On: 06/17/2016 05:42  Procedures Procedures (including critical care time)  Medications Ordered in ED Medications  methylPREDNISolone sodium succinate (SOLU-MEDROL) 125 mg/2 mL injection 125 mg (125 mg Intravenous Not Given 06/17/16 0600)  albuterol (PROVENTIL,VENTOLIN) solution continuous neb (not administered)  ipratropium (ATROVENT) nebulizer solution 0.5 mg (not administered)  albuterol (PROVENTIL) (2.5 MG/3ML) 0.083% nebulizer solution 5 mg (5 mg Nebulization Given 06/17/16 0512)  albuterol (PROVENTIL,VENTOLIN) solution continuous neb (10 mg/hr Nebulization Given 06/17/16 0616)  ipratropium (ATROVENT) nebulizer solution 0.5 mg (0.5 mg Nebulization Given 06/17/16 0616)  ibuprofen (ADVIL,MOTRIN) tablet 600 mg (600 mg Oral Given 06/17/16 0706)     Initial Impression / Assessment and Plan / ED Course  I have reviewed the triage vital signs and the nursing notes.  Pertinent labs & imaging results that were available during my care of the patient were reviewed by me and considered in my medical decision making (see chart for details).   At the time of my exam patient had received one albuterol nebulizer treatment. She was given a continuous nebulizer with Atrovent. She was given Solu-Medrol IV.  Patient was complaining to the nurse that her chest hurts when she coughs. She was given Motrin for pain.  Recheck at 7:30, patient states she's not feeling any better. She appears to be comfortable. When I listen to her she starts breathing deeply and making noise in her throat, however when I have her breathe more normally her lungs sound clear. I will have nursing staff ambulate her for pulse ox and do another continuous albuterol/Atrovent nebulizer. Patient has made it  well known me that she wants to be readmitted from the first moment I walked into the room. I will talk to the hospitalist, however I have made it clear to her that it is up  to the hospitalist whether she gets admitted or not.  08:30 AM Dr Roderic Palau will follow up on her pulse ox on ambulation and talk to the hospitalist.   Final Clinical Impressions(s) / ED Diagnoses   Final diagnoses:  COPD exacerbation ([place])   Disposition pending  Rolland Porter, MD, Barbette Or, MD 06/17/16 0830    Rolland Porter, MD 06/17/16 [phone]

## 2016-06-17 NOTE — ED Notes (Signed)
UNABLE TO COLLECT LABS AT THIS TIME MD AT BEDSIDE

## 2016-06-17 NOTE — ED Notes (Signed)
Ambulated in hall , O2 sat dropped to 84 %. MD made aware. Pt on 2 L [place] in room, O2 sat increases to 92.

## 2016-06-17 NOTE — ED Notes (Signed)
Bed: WA09 Expected date:  Expected time:  Means of arrival:  Comments: 61 yo F  resp difficulty

## 2016-06-17 NOTE — H&P (Addendum)
HISTORY AND PHYSICAL       PATIENT DETAILS Name: [name] Age: 61 y.o. Sex: female Date of Birth: Sep 06, 1955 Admit Date: 06/17/2016 DQQ:IWLNL, Janice Balzarine, PA   Patient coming from: Home   CHIEF COMPLAINT:  Shortness of breath  HPI: [name] is a 61 y.o. female with medical history significant COPD, tobacco abuse-quit one week back, hepatitis B and liver cirrhosis followed by hepatology clinic at [facility] Entecavir brought to the ED for evaluation of the above-noted complaints. Patient was just discharged from this facility approximately 6 days back, upon discharge she was doing good until a few days back. Approximately 2 days of those, she slowly started having worsening of her shortness of breath. She claims that she ran out of her regular nebulizer medications. Shortness of breath is mostly worse on exertion, and has gradually worsened in the past day or so, prompting ED visit today. She also has a cough that is mostly dry. She also complains of some nasal congestion. She denies any fever.  She denies any chest pain, nausea, vomiting or diarrhea.  In the emergency room, she was given steroids, bronchodilators-felt much better but not yet back to her baseline. She was ambulated and dropped her O2 saturation to the mid 80s. As a result the hospitalist service was asked to admit this patient for further evaluation and treatment.  ED Course:  See above  Note: Lives at: Home Mobility:  Independent Chronic Indwelling Foley:yes  REVIEW OF SYSTEMS:  Constitutional:   No  weight loss, night sweats,  Fevers, chills, fatigue.  HEENT:    No headaches, Dysphagia,Tooth/dental problems,Sore throat,  No sneezing, itching, ear ache  Cardio-vascular: No chest pain,Orthopnea, PND,lower extremity edema, anasarca, palpitations  GI:  No heartburn, indigestion, abdominal pain, nausea, vomiting, diarrhea, melena or hematochezia  Resp: No  hemoptysis,plueritic chest pain.    Skin:  No rash or lesions.  GU:  No dysuria, change in color of urine, no urgency or frequency.  No flank pain.  Musculoskeletal: No joint pain or swelling.  No decreased range of motion.  No back pain.  Endocrine: No heat intolerance, no cold intolerance, no polyuria, no polydipsia  Psych: No change in mood or affect. No depression or anxiety.  No memory loss.   ALLERGIES:   Allergies  Allergen Reactions  . 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 Amitriptyline Hcl Other (See Comments)     Causes her to be very "disoriented".  . Gabapentin Rash and Other (See Comments)    REACTION: rash in mouth   . Cymbalta [Duloxetine Hcl] Other (See Comments)    headaches  . Neomycin-Bacitracin Zn-Polymyx Rash    PAST MEDICAL HISTORY: Past Medical History:  Diagnosis Date  . Agoraphobia with panic attacks   . Altered mental state 10/26/2013  . Ankle fracture, left 11/27/2010   S/p ORIF 11/2   . Anxiety   . Arthritis    "knees; lower back" (10/30/2013)  . Bipolar disorder ([place])   . Cavitary pneumonia   . Cirrhosis ([place])   . CKD (chronic kidney disease), stage III   . COPD (chronic obstructive pulmonary disease) ([place])   . Depression   . DVT (deep venous thrombosis) ([place]) 09/2013   RLE  . GERD (gastroesophageal reflux disease)   . Heart murmur   . Hepatitis B   . History of blood transfusion    "due to excessive blood loss before  hysterectomy"  . History of urinary tract infection   . Hypertension    currently on no medication   . Kidney stones   . Pneumonia    "4 times in the past year" (10/30/2013)  . Pulmonary emboli ([place]) 09/2013  . Shortness of breath dyspnea    increased exertion;exercise  . Urinary frequency   . Urinary incontinence     PAST SURGICAL HISTORY: Past Surgical History:  Procedure Laterality Date  . ABDOMINAL HYSTERECTOMY    . ANKLE FRACTURE SURGERY Left   . CARPAL TUNNEL RELEASE Left   . DILATION AND CURETTAGE OF  UTERUS  X 2  . FRACTURE SURGERY    . HAND SURGERY     1980's/left hand   . INSERTION OF MESH N/A 12/10/2014   Procedure: INSERTION OF MESH;  Surgeon: Michael Boston, MD;  Location: [facility];  Service: General;  Laterality: N/A;  . LAPAROSCOPIC ASSISTED VENTRAL HERNIA REPAIR N/A 12/10/2014   Procedure: LAPAROSCOPIC ASSISTED REPAIR OF INCARCERATED UMBILICAL HERNIA ;  Surgeon: Michael Boston, MD;  Location: [facility];  Service: General;  Laterality: N/A;  . TUBAL LIGATION      MEDICATIONS AT HOME: Prior to Admission medications   Medication Sig Start Date End Date Taking? Authorizing Provider  albuterol (PROAIR HFA) 108 (90 Base) MCG/ACT inhaler INHALE 2 PUFFS INTO THE LUNGS EVERY 4 HOURS AS NEEDED FOR WHEEZING 12/14/15  Yes [provider]  albuterol (PROVENTIL) (2.5 MG/3ML) 0.083% nebulizer solution Take 3 mLs (2.5 mg total) by nebulization every 4 (four) hours as needed for wheezing or shortness of breath. 06/11/16  Yes Patrecia Pour, Christean Grief, MD  ALPRAZolam Duanne Moron) 1 MG tablet Take 0.5 tablets (0.5 mg total) by mouth 2 (two) times daily as needed for anxiety. Patient taking differently: Take 1 mg by mouth 4 (four) times daily as needed for anxiety.  06/02/15  Yes Elgergawy, Silver Huguenin, MD  budesonide-formoterol (SYMBICORT) 160-4.5 MCG/ACT inhaler Inhale 2 puffs into the lungs 2 (two) times daily.  12/23/15 12/22/16 Yes [provider]  entecavir (BARACLUDE) 0.5 MG tablet Take 0.5 mg by mouth every other day.    Yes [provider]  lamoTRIgine (LAMICTAL) 100 MG tablet Take 100 mg by mouth every morning. 06/12/16  Yes [provider]  LATUDA 40 MG TABS tablet Take 40 mg by mouth daily. 06/12/16  Yes [provider]  oxyCODONE-acetaminophen (PERCOCET) 7.5-325 MG tablet Take 1 tablet by mouth every 6 (six) hours as needed for severe pain.   Yes [provider]  pantoprazole (PROTONIX) 40 MG tablet Take 40 mg by mouth daily. 12/23/13  Yes [provider]  rivaroxaban (XARELTO) 20 MG TABS tablet Take 20 mg by mouth daily.   Yes [provider]  spironolactone (ALDACTONE) 50 MG tablet Take 100 mg by mouth every morning. 03/10/15  Yes [provider]  tiotropium (SPIRIVA) 18 MCG inhalation capsule Place 1 capsule (18 mcg total) into inhaler and inhale daily. 06/12/16  Yes Doreatha Lew, MD  VIIBRYD STARTER PACK 10 & 20 MG KIT See admin instructions. see package 06/13/16  Yes [provider]  ibuprofen (ADVIL,MOTRIN) 800 MG tablet Take 1 tablet (800 mg total) by mouth 3 (three) times daily. Patient not taking: Reported on 06/17/2016 11/12/15   de Villier, Daryl F II, PA  nicotine (NICODERM CQ - DOSED IN MG/24 HOURS) 21 mg/24hr patch Place 1 patch (21 mg total) onto the skin daily. Patient not taking: Reported on 06/17/2016 06/12/16   Patrecia Pour,  Christean Grief, MD  predniSONE (DELTASONE) 10 MG tablet Take 4 tablets for 3 days; Take 3 tablets for 4 days; Take 2 tablets for 3 days; Take 1 tablet for 4 days Patient not taking: Reported on 06/17/2016 06/11/16   Patrecia Pour, Christean Grief, MD  Spacer/Aero-Holding Chambers (AEROCHAMBER MV) inhaler Use as instructed Patient not taking: Reported on 06/17/2016 10/08/14   Rigoberto Noel, MD    FAMILY HISTORY: Family History  Problem Relation Age of Onset  . Stroke Mother   . Lung cancer Father     SOCIAL HISTORY:  reports that she has been smoking Cigarettes.  She has a 17.50 pack-year smoking history. She has never used smokeless tobacco. She reports that she does not drink alcohol or use drugs.  PHYSICAL EXAM: Blood pressure 122/68, pulse 80, resp. rate (!) 24, SpO2 92 %.  General appearance :Awake, alert, not in any distress. Speech Clear. Not toxic Looking Eyes:, pupils equally reactive to light and accomodation,no scleral icterus.Pink conjunctiva HEENT: Atraumatic and Normocephalic Neck: supple, no JVD. No cervical lymphadenopathy. No thyromegaly Resp:Good air entry  bilaterally,Coarse rhonchi all over CVS: S1 S2 regular, no murmurs.  GI: Bowel sounds present, Non tender and not distended with no gaurding, rigidity or rebound.No organomegaly Extremities: B/L Lower Ext shows no edema, both legs are warm to touch Neurology:  speech clear,Non focal, sensation is grossly intact. Psychiatric: Normal judgment and insight. Alert and oriented x 3. Normal mood. Musculoskeletal:.No digital cyanosis Skin:No Rash, warm and dry Wounds:N/A  LABS ON ADMISSION:  I have personally reviewed following labs and imaging studies  CBC:  Recent Labs Lab 06/17/16 0927 06/17/16 1009  WBC 6.4  --   NEUTROABS 6.0  --   HGB 14.4 15.3*  HCT 43.0 45.0  MCV 91.9  --   PLT 94*  --     Basic Metabolic Panel:  Recent Labs Lab 06/11/16 0530 06/17/16 1009  NA 135 136  K 4.5 5.9*  CL 102 106  CO2 21*  --   GLUCOSE 181* 174*  BUN 26* 56*  CREATININE 1.37* 1.40*  CALCIUM 9.5  --     GFR: Estimated Creatinine Clearance: 50.2 mL/min (A) (by C-G formula based on SCr of 1.4 mg/dL (H)).  Liver Function Tests: No results for input(s): AST, ALT, ALKPHOS, BILITOT, PROT, ALBUMIN in the last 168 hours. No results for input(s): LIPASE, AMYLASE in the last 168 hours. No results for input(s): AMMONIA in the last 168 hours.  Coagulation Profile: No results for input(s): INR, PROTIME in the last 168 hours.  Cardiac Enzymes: No results for input(s): CKTOTAL, CKMB, CKMBINDEX, TROPONINI in the last 168 hours.  BNP (last 3 results) No results for input(s): PROBNP in the last 8760 hours.  HbA1C: No results for input(s): HGBA1C in the last 72 hours.  CBG: No results for input(s): GLUCAP in the last 168 hours.  Lipid Profile: No results for input(s): CHOL, HDL, LDLCALC, TRIG, CHOLHDL, LDLDIRECT in the last 72 hours.  Thyroid Function Tests: No results for input(s): TSH, T4TOTAL, FREET4, T3FREE, THYROIDAB in the last 72 hours.  Anemia Panel: No results for input(s):  VITAMINB12, FOLATE, FERRITIN, TIBC, IRON, RETICCTPCT in the last 72 hours.  Urine analysis:    Component Value Date/Time   COLORURINE STRAW (A) 06/10/2016 2148   APPEARANCEUR CLEAR 06/10/2016 2148   LABSPEC 1.006 06/10/2016 2148   PHURINE 6.0 06/10/2016 2148   GLUCOSEU NEGATIVE 06/10/2016 2148   HGBUR LARGE (A) 06/10/2016 2148   BILIRUBINUR NEGATIVE 06/10/2016 2148  [place] NEGATIVE 06/10/2016 2148   PROTEINUR NEGATIVE 06/10/2016 2148   UROBILINOGEN 1.0 04/03/2014 1645   NITRITE NEGATIVE 06/10/2016 2148   LEUKOCYTESUR NEGATIVE 06/10/2016 2148    Sepsis Labs: Lactic Acid, Venous    Component Value Date/Time   LATICACIDVEN 2.22 ([place]) 05/30/2015 2316     Microbiology: No results found for this or any previous visit (from the past 240 hour(s)).    RADIOLOGIC STUDIES ON ADMISSION: Dg Chest 2 View  Result Date: 06/17/2016 CLINICAL DATA:  Shortness of breath. Patient was recently hospitalized for respiratory infection (discharged home last Sunday). Patient states her symptoms are not getting better. Hx of COPD, PE and DVT, heart murmur, hypertension, pneumonia. Smoker x 35 years EXAM: CHEST  2 VIEW COMPARISON:  06/09/2016 FINDINGS: Shallow inspiration with linear atelectasis in the lung bases. No airspace disease in the lungs. No blunting of costophrenic angles. No pneumothorax. Normal heart size and pulmonary vascularity. Tortuous aorta. Degenerative changes in the spine. IMPRESSION: Shallow inspiration with atelectasis in the lung bases. Electronically Signed   By: Lucienne Capers M.D.   On: 06/17/2016 05:42    I have personally reviewed images of chest xray   EKG:  Personally reviewed. Normal sinus rhythm  ASSESSMENT AND PLAN: Acute hypoxic respiratory failure secondary to COPD exacerbation: Admit to the medical surgical unit-start steroids, bronchodilators and other supportive measures. She complains of some nasal congestion-hence will start Claritin and Flonase and use  oxymetolazone as needed. She just stopped smoking a 1 week back, she was encouraged to continue to abstain from tobacco use. She will likely require ambulatory oxygen evaluation to see if she requires home oxygen prior to discharge.  Mild hyperkalemia: Not sure this is a lab error-she is on Aldactone-we will treat with 1 dose of Kayexalate, and repeat chemistry panel. Note no EKG changes.  Addendum 11:40 AM Informed by RN-that repeat potassium was within normal limits.  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 Anticoagulation will be continued during this hospital stay  History of hepatitis B and liver cirrhosis: Continue Entecavir-hold Aldactone for now due to hyperkalemia. Her volume status is stable and exam. She appears to have mild chronic thrombocytopenia.  Essential hypertension: Hold Aldactone for now-repeat electrolytes to ensure hyperkalemia has resolved.  GERD: Continue PPI  History of chronic pain: Continue usual narcotic regimen  History of bipolar disorder/depression/anxiety: Continue with Xanax, Lamictal and Latuda  Further plan will depend as patient's clinical course evolves and further radiologic and laboratory data become available. Patient will be monitored closely.  Above noted plan was discussed with patient face to face at bedside,she was in agreement.   CONSULTS: None   DVT Prophylaxis: Xarelto  Code Status: Full Code  Disposition Plan:  Discharge back home  possibly in 2 days, depending on clinical course  Admission status:  Inpatient  going to medical floor    Total time spent  55 minutes.Greater than 50% of this time was spent in counseling, explanation of diagnosis, planning of further management, and coordination of care.  Oren Binet [facility] Pager [phone]  If 7PM-7AM, please contact night-coverage [email or web address] Password [email or web address] 06/17/2016, 11:02 AM

## 2016-06-17 NOTE — ED Triage Notes (Signed)
Brought in by EMS from home with c/o shortness of breath.  Pt was recently hospitalized for respiratory infection (discharged home last Sunday).  Hx of COPD, PE and DVT.  Pt's O2 sat was 93% on room air by EMS on scene.  Was given Solu-Medrol 125 mg IV and neb treatment of Albuterol 5 mg and Atrovent 0.5 mg.

## 2016-06-18 DIAGNOSIS — I2782 Chronic pulmonary embolism: Secondary | ICD-10-CM

## 2016-06-18 DIAGNOSIS — Z720 Tobacco use: Secondary | ICD-10-CM

## 2016-06-18 LAB — CBC
HEMATOCRIT: 42.2 % (ref 36.0–46.0)
Hemoglobin: 13.8 g/dL (ref 12.0–15.0)
MCH: 30.1 pg (ref 26.0–34.0)
MCHC: 32.7 g/dL (ref 30.0–36.0)
MCV: 91.9 fL (ref 78.0–100.0)
PLATELETS: 103 10*3/uL — AB (ref 150–400)
RBC: 4.59 MIL/uL (ref 3.87–5.11)
RDW: 13.6 % (ref 11.5–15.5)
WBC: 10.5 10*3/uL (ref 4.0–10.5)

## 2016-06-18 LAB — GLUCOSE, CAPILLARY
GLUCOSE-CAPILLARY: 229 mg/dL — AB (ref 65–99)
GLUCOSE-CAPILLARY: 120 mg/dL — AB (ref 65–99)
Glucose-Capillary: 137 mg/dL — ABNORMAL HIGH (ref 65–99)

## 2016-06-18 LAB — BASIC METABOLIC PANEL
Anion gap: 11 (ref 5–15)
BUN: 31 mg/dL — ABNORMAL HIGH (ref 6–20)
CHLORIDE: 106 mmol/L (ref 101–111)
CO2: 21 mmol/L — ABNORMAL LOW (ref 22–32)
CREATININE: 1.32 mg/dL — AB (ref 0.44–1.00)
Calcium: 9.4 mg/dL (ref 8.9–10.3)
GFR calc non Af Amer: 43 mL/min — ABNORMAL LOW (ref >60)
GFR, EST AFRICAN AMERICAN: 50 mL/min — AB (ref >60)
Glucose, Bld: 136 mg/dL — ABNORMAL HIGH (ref 65–99)
POTASSIUM: 4.7 mmol/L (ref 3.5–5.1)
SODIUM: 138 mmol/L (ref 135–145)

## 2016-06-18 MED ORDER — METHYLPREDNISOLONE SODIUM SUCC 125 MG IJ SOLR
60.0000 mg | Freq: Two times a day (BID) | INTRAMUSCULAR | Status: DC
  Administered 2016-06-18 – 2016-06-22 (×8): 60 mg via INTRAVENOUS
  Filled 2016-06-18 (×8): qty 2

## 2016-06-18 MED ORDER — ARFORMOTEROL TARTRATE 15 MCG/2ML IN NEBU
15.0000 ug | INHALATION_SOLUTION | Freq: Two times a day (BID) | RESPIRATORY_TRACT | Status: DC
  Administered 2016-06-18 – 2016-06-23 (×10): 15 ug via RESPIRATORY_TRACT
  Filled 2016-06-18 (×11): qty 2

## 2016-06-18 MED ORDER — IPRATROPIUM-ALBUTEROL 0.5-2.5 (3) MG/3ML IN SOLN: 3.0000 mL | RESPIRATORY_TRACT | Status: DC | PRN

## 2016-06-18 NOTE — Progress Notes (Addendum)
Patient ID: [name], female   DOB: August 03, 1955, 61 y.o.   MRN: [id number]  PROGRESS NOTE    [name]  VPX:[id number] DOB: 12-28-1955 DOA: 06/17/2016  PCP: Rich Fuchs, PA   Brief Narrative:  61 year old female with past medical history of COPD, PE and DVT on xarelto, hepatitis B infection, recent hospitalization for COPD (from 06/09/16 - 06/11/2016) who presented to ED with worsening shortness of breath, dry cough and wheezing for past 2 days prior to this admission. Apparently she ran out of her bronchodilator meds. CXR showed low lung volumes. She was admitted for acute COPD exacerbation.   Assessment & Plan:   Principal Problem: Acute COPD exacerbation ([place]) / Acute respiratory failure with hypoxia - Continue duoneb scheduled TID, duoneb every 2 hours as needed for shortness of breath or wheezing - Continue Pulmicort nebulizer BID, add brovana nebulizer BID - Continue oxyen support via [place] to keep O2 saturation above 90% - Continue solumedrol but change frequency to Q 12 hours instead of Q 8 hours   Active Problems:   Hepatitis B virus infection - Continue entecavir    Hyperkalemia - Repeat potassium WNL    CKD stage 3 - Baseline Cr in 2015 was 1.7 - Cr within baseline range     Essential hypertension - Holding aldactone due to initial hyperkalemia - BP stable, 126/79    Tobacco abuse - Counseled on cessation    Pulmonary embolism (HCC) / History of DVT - Continue xarelto    Thrombocytopenia ([facility])   DVT prophylaxis: on Xarelto  Code Status: full code  Family Communication: no family at the bedside this am Disposition Plan: home once resp status stable    Consultants:   None   Procedures:  None   Antimicrobials:   None     Subjective: Still wheezing.   Objective: Vitals:   06/17/16 2022 06/17/16 2054 06/18/16 0459 06/18/16 0912  BP:  129/73 126/79   Pulse:  75 61 75  Resp:  20 18 (!) 22  Temp:  98 F (36.7 C) 98 F (36.7 C)     TempSrc:  Oral Oral   SpO2: 94% 96% 96% 91%  Weight:      Height:        Intake/Output Summary (Last 24 hours) at 06/18/16 1021 Last data filed at 06/18/16 0954  Gross per 24 hour  Intake             1680 ml  Output                0 ml  Net             1680 ml   Filed Weights   06/17/16 1321  Weight: 102.2 kg (225 lb 5 oz)    Examination:  General exam: Appears calm and comfortable  Respiratory system: wheezing in upper lung lobes bilaterally, no rhonchi  Cardiovascular system: S1 & S2 heard, RRR.  Gastrointestinal system: Abdomen is nondistended, soft and nontender. No organomegaly or masses felt. Normal bowel sounds heard. Central nervous system: Alert and oriented. No focal neurological deficits. Extremities: Symmetric 5 x 5 power. Skin: No rashes, lesions or ulcers Psychiatry: Judgement and insight appear normal. Mood & affect appropriate.   Data Reviewed: I have personally reviewed following labs and imaging studies  CBC:  Recent Labs Lab 06/17/16 0927 06/17/16 1009 06/18/16 0404  WBC 6.4  --  10.5  NEUTROABS 6.0  --   --   HGB  14.4 15.3* 13.8  HCT 43.0 45.0 42.2  MCV 91.9  --  91.9  PLT 94*  --  841*   Basic Metabolic Panel:  Recent Labs Lab 06/17/16 0926 06/17/16 1009 06/17/16 1529 06/18/16 0404  NA  --  136 136 138  K 4.5 5.9* 4.7 4.7  CL  --  106 105 106  CO2  --   --  20* 21*  GLUCOSE  --  174* 336* 136*  BUN  --  56* 37* 31*  CREATININE  --  1.40* 1.70* 1.32*  CALCIUM  --   --  9.1 9.4   GFR: Estimated Creatinine Clearance: 53.4 mL/min (A) (by C-G formula based on SCr of 1.32 mg/dL (H)). Liver Function Tests: No results for input(s): AST, ALT, ALKPHOS, BILITOT, PROT, ALBUMIN in the last 168 hours. No results for input(s): LIPASE, AMYLASE in the last 168 hours. No results for input(s): AMMONIA in the last 168 hours. Coagulation Profile: No results for input(s): INR, PROTIME in the last 168 hours. Cardiac Enzymes: No results for  input(s): CKTOTAL, CKMB, CKMBINDEX, TROPONINI in the last 168 hours. BNP (last 3 results) No results for input(s): PROBNP in the last 8760 hours. HbA1C: No results for input(s): HGBA1C in the last 72 hours. CBG:  Recent Labs Lab 06/17/16 2116 06/18/16 0741  GLUCAP 258* 137*   Lipid Profile: No results for input(s): CHOL, HDL, LDLCALC, TRIG, CHOLHDL, LDLDIRECT in the last 72 hours. Thyroid Function Tests: No results for input(s): TSH, T4TOTAL, FREET4, T3FREE, THYROIDAB in the last 72 hours. Anemia Panel: No results for input(s): VITAMINB12, FOLATE, FERRITIN, TIBC, IRON, RETICCTPCT in the last 72 hours. Urine analysis:    Component Value Date/Time   COLORURINE STRAW (A) 06/10/2016 2148   APPEARANCEUR CLEAR 06/10/2016 2148   LABSPEC 1.006 06/10/2016 2148   PHURINE 6.0 06/10/2016 2148   GLUCOSEU NEGATIVE 06/10/2016 2148   HGBUR LARGE (A) 06/10/2016 2148   BILIRUBINUR NEGATIVE 06/10/2016 2148   KETONESUR NEGATIVE 06/10/2016 2148   PROTEINUR NEGATIVE 06/10/2016 2148   UROBILINOGEN 1.0 04/03/2014 1645   NITRITE NEGATIVE 06/10/2016 2148   LEUKOCYTESUR NEGATIVE 06/10/2016 2148   Sepsis Labs: @LABRCNTIP (procalcitonin:4,lacticidven:4)   )No results found for this or any previous visit (from the past 240 hour(s)).    Radiology Studies: Dg Chest 2 View  Result Date: 06/17/2016 CLINICAL DATA:  Shortness of breath. Patient was recently hospitalized for respiratory infection (discharged home last Sunday). Patient states her symptoms are not getting better. Hx of COPD, PE and DVT, heart murmur, hypertension, pneumonia. Smoker x 35 years EXAM: CHEST  2 VIEW COMPARISON:  06/09/2016 FINDINGS: Shallow inspiration with linear atelectasis in the lung bases. No airspace disease in the lungs. No blunting of costophrenic angles. No pneumothorax. Normal heart size and pulmonary vascularity. Tortuous aorta. Degenerative changes in the spine. IMPRESSION: Shallow inspiration with atelectasis in the  lung bases. Electronically Signed   By: Lucienne Capers M.D.   On: 06/17/2016 05:42        Scheduled Meds: . budesonide (PULMICORT) nebulizer solution  0.25 mg Nebulization BID  . entecavir  0.5 mg Oral QODAY  . fluticasone  2 spray Each Nare Daily  . guaiFENesin  600 mg Oral BID  . insulin aspart  0-9 Units Subcutaneous TID WC  . ipratropium-albuterol  3 mL Nebulization TID  . lamoTRIgine  100 mg Oral q morning - 10a  . loratadine  10 mg Oral Daily  . lurasidone  40 mg Oral QAC breakfast  . methylPREDNISolone sodium  succinate  125 mg Intravenous Once  . methylPREDNISolone (SOLU-MEDROL) injection  60 mg Intravenous Q8H  . pantoprazole  40 mg Oral Daily  . rivaroxaban  20 mg Oral QAC supper  . sodium chloride flush  3 mL Intravenous Q12H  . Vilazodone HCl  20 mg Oral Daily   Continuous Infusions: . sodium chloride       LOS: 1 day    Time spent: 25 minutes  Greater than 50% of the time spent on counseling and coordinating the care.   Leisa Lenz, MD [facility] Pager [phone]  If 7PM-7AM, please contact night-coverage [email or web address] Password [email or web address] 06/18/2016, 10:21 AM

## 2016-06-19 DIAGNOSIS — J9601 Acute respiratory failure with hypoxia: Secondary | ICD-10-CM

## 2016-06-19 DIAGNOSIS — Z86711 Personal history of pulmonary embolism: Secondary | ICD-10-CM

## 2016-06-19 LAB — BASIC METABOLIC PANEL
ANION GAP: 9 (ref 5–15)
BUN: 36 mg/dL — ABNORMAL HIGH (ref 6–20)
CHLORIDE: 102 mmol/L (ref 101–111)
CO2: 22 mmol/L (ref 22–32)
Calcium: 9.2 mg/dL (ref 8.9–10.3)
Creatinine, Ser: 1.48 mg/dL — ABNORMAL HIGH (ref 0.44–1.00)
GFR, EST AFRICAN AMERICAN: 44 mL/min — AB (ref >60)
GFR, EST NON AFRICAN AMERICAN: 38 mL/min — AB (ref >60)
Glucose, Bld: 249 mg/dL — ABNORMAL HIGH (ref 65–99)
POTASSIUM: 4.4 mmol/L (ref 3.5–5.1)
SODIUM: 133 mmol/L — AB (ref 135–145)

## 2016-06-19 LAB — GLUCOSE, CAPILLARY
GLUCOSE-CAPILLARY: 162 mg/dL — AB (ref 65–99)
GLUCOSE-CAPILLARY: 144 mg/dL — AB (ref 65–99)
GLUCOSE-CAPILLARY: 130 mg/dL — AB (ref 65–99)
Glucose-Capillary: 147 mg/dL — ABNORMAL HIGH (ref 65–99)

## 2016-06-19 LAB — CBC
HCT: 40.6 % (ref 36.0–46.0)
HEMOGLOBIN: 13.9 g/dL (ref 12.0–15.0)
MCH: 31.3 pg (ref 26.0–34.0)
MCHC: 34.2 g/dL (ref 30.0–36.0)
MCV: 91.4 fL (ref 78.0–100.0)
PLATELETS: 94 10*3/uL — AB (ref 150–400)
RBC: 4.44 MIL/uL (ref 3.87–5.11)
RDW: 13.5 % (ref 11.5–15.5)
WBC: 8.4 10*3/uL (ref 4.0–10.5)

## 2016-06-19 MED ORDER — LEVALBUTEROL HCL 1.25 MG/0.5ML IN NEBU
1.2500 mg | INHALATION_SOLUTION | RESPIRATORY_TRACT | Status: DC
  Administered 2016-06-19: 1.25 mg via RESPIRATORY_TRACT
  Filled 2016-06-19: qty 0.5

## 2016-06-19 MED ORDER — LEVALBUTEROL HCL 1.25 MG/0.5ML IN NEBU
1.2500 mg | INHALATION_SOLUTION | Freq: Four times a day (QID) | RESPIRATORY_TRACT | Status: DC
  Administered 2016-06-19 – 2016-06-23 (×16): 1.25 mg via RESPIRATORY_TRACT
  Filled 2016-06-19 (×18): qty 0.5

## 2016-06-19 MED ORDER — IPRATROPIUM BROMIDE 0.02 % IN SOLN
0.5000 mg | Freq: Four times a day (QID) | RESPIRATORY_TRACT | Status: DC
  Administered 2016-06-19 – 2016-06-23 (×16): 0.5 mg via RESPIRATORY_TRACT
  Filled 2016-06-19 (×18): qty 2.5

## 2016-06-19 MED ORDER — IPRATROPIUM BROMIDE 0.02 % IN SOLN
0.5000 mg | RESPIRATORY_TRACT | Status: DC
  Administered 2016-06-19: 0.5 mg via RESPIRATORY_TRACT

## 2016-06-19 MED ORDER — IPRATROPIUM BROMIDE 0.02 % IN SOLN
0.5000 mg | RESPIRATORY_TRACT | Status: DC | PRN
  Administered 2016-06-19: 0.5 mg via RESPIRATORY_TRACT
  Filled 2016-06-19: qty 2.5

## 2016-06-19 MED ORDER — LEVALBUTEROL HCL 1.25 MG/0.5ML IN NEBU
1.2500 mg | INHALATION_SOLUTION | RESPIRATORY_TRACT | Status: DC | PRN
  Administered 2016-06-19: 1.25 mg via RESPIRATORY_TRACT
  Filled 2016-06-19: qty 0.5

## 2016-06-19 NOTE — Progress Notes (Signed)
Patient ID: [name], female   DOB: 10-22-1955, 61 y.o.   MRN: [id number]  PROGRESS NOTE    Jocelyn ANYELINA CLAYCOMB  [id number]:[id number] DOB: 1955-07-10 DOA: 06/17/2016  PCP: Rich Fuchs, PA   Brief Narrative:  61 year old female with past medical history of COPD, PE and DVT on xarelto, hepatitis B infection, recent hospitalization for COPD (from 06/09/16 - 06/11/2016) who presented to ED with worsening shortness of breath, dry cough and wheezing for past 2 days prior to this admission. Apparently she ran out of her bronchodilator meds. CXR showed low lung volumes. She was admitted for acute COPD exacerbation.   Assessment & Plan:   Principal Problem: Acute COPD exacerbation ([place]) / Acute respiratory failure with hypoxia - Continue current bronchodilators, use xopenex and Atrovent instead of duoneb as duoneb makes her very anxious and she ahd palpitations  - Continue Pulmicort and Brovana nebulizer BID - Continue current IV steroids - Continue oxygen support via [place] to keep O2 sats above 90%  Active Problems:   Hepatitis B virus infection - Continue entecavir    Hyperkalemia - Repeat potassium WNL    CKD stage 3 - Baseline Cr in 2015 was 1.7 - Cr within baseline range     Essential hypertension - Holding aldactone due to initial hyperkalemia - BP stable     Tobacco abuse - Counseled on cessation    Pulmonary embolism (HCC) / History of DVT - Hold xarelto as per pt she has dental procedure Tuesday next week and was instructed by dentist to hold it for 1 week prior to procedure   DVT prophylaxis: SCD  Code Status: full code  Family Communication: no family at the bedside this am Disposition Plan: home in am if resp status stable    Consultants:   None   Procedures:  None   Antimicrobials:   None     Subjective: Continuing to wheeze this am.  Objective: Vitals:   06/18/16 2019 06/18/16 2120 06/19/16 0506 06/19/16 1005  BP:  130/70 (!) 152/73   Pulse:  65 (!) 59    Resp:  16 18   Temp:  98.1 F (36.7 C) 97.7 F (36.5 C)   TempSrc:  Oral Oral   SpO2: 92% 95% 97% 96%  Weight:      Height:        Intake/Output Summary (Last 24 hours) at 06/19/16 1338 Last data filed at 06/19/16 3235  Gross per 24 hour  Intake              600 ml  Output                0 ml  Net              600 ml   Filed Weights   06/17/16 1321  Weight: 102.2 kg (225 lb 5 oz)    Examination:  General exam: Appears calm and comfortable, no distress  Respiratory system: wheezing throughout, no rhonchi  Cardiovascular system: rate controlled, appreciate S1, S2 Gastrointestinal system: (+) BS, non tender  Central nervous system: No focal deficits  Extremities: no swelling, palpable pulses  Skin: warm ,dry  Psychiatry: Normal mood and behavior    Data Reviewed: I have personally reviewed following labs and imaging studies  CBC:  Recent Labs Lab 06/17/16 0927 06/17/16 1009 06/18/16 0404 06/19/16 0947  WBC 6.4  --  10.5 8.4  NEUTROABS 6.0  --   --   --   HGB  14.4 15.3* 13.8 13.9  HCT 43.0 45.0 42.2 40.6  MCV 91.9  --  91.9 91.4  PLT 94*  --  103* 94*   Basic Metabolic Panel:  Recent Labs Lab 06/17/16 0926 06/17/16 1009 06/17/16 1529 06/18/16 0404 06/19/16 0947  NA  --  136 136 138 133*  K 4.5 5.9* 4.7 4.7 4.4  CL  --  106 105 106 102  CO2  --   --  20* 21* 22  GLUCOSE  --  174* 336* 136* 249*  BUN  --  56* 37* 31* 36*  CREATININE  --  1.40* 1.70* 1.32* 1.48*  CALCIUM  --   --  9.1 9.4 9.2   GFR: Estimated Creatinine Clearance: 47.6 mL/min (A) (by C-G formula based on SCr of 1.48 mg/dL (H)). Liver Function Tests: No results for input(s): AST, ALT, ALKPHOS, BILITOT, PROT, ALBUMIN in the last 168 hours. No results for input(s): LIPASE, AMYLASE in the last 168 hours. No results for input(s): AMMONIA in the last 168 hours. Coagulation Profile: No results for input(s): INR, PROTIME in the last 168 hours. Cardiac Enzymes: No results for input(s):  CKTOTAL, CKMB, CKMBINDEX, TROPONINI in the last 168 hours. BNP (last 3 results) No results for input(s): PROBNP in the last 8760 hours. HbA1C: No results for input(s): HGBA1C in the last 72 hours. CBG:  Recent Labs Lab 06/18/16 0741 06/18/16 1153 06/18/16 1650 06/19/16 0724 06/19/16 1146  GLUCAP 137* 229* 120* 162* 144*   Lipid Profile: No results for input(s): CHOL, HDL, LDLCALC, TRIG, CHOLHDL, LDLDIRECT in the last 72 hours. Thyroid Function Tests: No results for input(s): TSH, T4TOTAL, FREET4, T3FREE, THYROIDAB in the last 72 hours. Anemia Panel: No results for input(s): VITAMINB12, FOLATE, FERRITIN, TIBC, IRON, RETICCTPCT in the last 72 hours. Urine analysis:    Component Value Date/Time   COLORURINE STRAW (A) 06/10/2016 2148   APPEARANCEUR CLEAR 06/10/2016 2148   LABSPEC 1.006 06/10/2016 2148   PHURINE 6.0 06/10/2016 2148   GLUCOSEU NEGATIVE 06/10/2016 2148   HGBUR LARGE (A) 06/10/2016 2148   BILIRUBINUR NEGATIVE 06/10/2016 2148   KETONESUR NEGATIVE 06/10/2016 2148   PROTEINUR NEGATIVE 06/10/2016 2148   UROBILINOGEN 1.0 04/03/2014 1645   NITRITE NEGATIVE 06/10/2016 2148   LEUKOCYTESUR NEGATIVE 06/10/2016 2148   Sepsis Labs: @LABRCNTIP (procalcitonin:4,lacticidven:4)   )No results found for this or any previous visit (from the past 240 hour(s)).    Radiology Studies: Dg Chest 2 View  Result Date: 06/17/2016 CLINICAL DATA:  Shortness of breath. Patient was recently hospitalized for respiratory infection (discharged home last Sunday). Patient states her symptoms are not getting better. Hx of COPD, PE and DVT, heart murmur, hypertension, pneumonia. Smoker x 35 years EXAM: CHEST  2 VIEW COMPARISON:  06/09/2016 FINDINGS: Shallow inspiration with linear atelectasis in the lung bases. No airspace disease in the lungs. No blunting of costophrenic angles. No pneumothorax. Normal heart size and pulmonary vascularity. Tortuous aorta. Degenerative changes in the spine.  IMPRESSION: Shallow inspiration with atelectasis in the lung bases. Electronically Signed   By: Lucienne Capers M.D.   On: 06/17/2016 05:42        Scheduled Meds: . arformoterol  15 mcg Nebulization BID  . budesonide (PULMICORT) nebulizer solution  0.25 mg Nebulization BID  . entecavir  0.5 mg Oral QODAY  . fluticasone  2 spray Each Nare Daily  . guaiFENesin  600 mg Oral BID  . insulin aspart  0-9 Units Subcutaneous TID WC  . ipratropium  0.5 mg Nebulization Q6H  .  lamoTRIgine  100 mg Oral q morning - 10a  . levalbuterol  1.25 mg Nebulization Q6H  . loratadine  10 mg Oral Daily  . lurasidone  40 mg Oral QAC breakfast  . methylPREDNISolone (SOLU-MEDROL) injection  60 mg Intravenous Q12H  . pantoprazole  40 mg Oral Daily  . sodium chloride flush  3 mL Intravenous Q12H  . Vilazodone HCl  20 mg Oral Daily   Continuous Infusions: . sodium chloride       LOS: 2 days    Time spent: 25 minutes  Greater than 50% of the time spent on counseling and coordinating the care.   Leisa Lenz, MD [facility] Pager [phone]  If 7PM-7AM, please contact night-coverage [email or web address] Password [email or web address] 06/19/2016, 1:38 PM

## 2016-06-20 LAB — CBC
HCT: 41.7 % (ref 36.0–46.0)
HEMOGLOBIN: 14.3 g/dL (ref 12.0–15.0)
MCH: 31.4 pg (ref 26.0–34.0)
MCHC: 34.3 g/dL (ref 30.0–36.0)
MCV: 91.6 fL (ref 78.0–100.0)
PLATELETS: 97 10*3/uL — AB (ref 150–400)
RBC: 4.55 MIL/uL (ref 3.87–5.11)
RDW: 13.5 % (ref 11.5–15.5)
WBC: 6.4 10*3/uL (ref 4.0–10.5)

## 2016-06-20 LAB — GLUCOSE, CAPILLARY
GLUCOSE-CAPILLARY: 174 mg/dL — AB (ref 65–99)
Glucose-Capillary: 132 mg/dL — ABNORMAL HIGH (ref 65–99)
Glucose-Capillary: 197 mg/dL — ABNORMAL HIGH (ref 65–99)
Glucose-Capillary: 158 mg/dL — ABNORMAL HIGH (ref 65–99)

## 2016-06-20 LAB — BASIC METABOLIC PANEL
Anion gap: 8 (ref 5–15)
BUN: 39 mg/dL — AB (ref 6–20)
CALCIUM: 9.2 mg/dL (ref 8.9–10.3)
CO2: 25 mmol/L (ref 22–32)
CREATININE: 1.45 mg/dL — AB (ref 0.44–1.00)
Chloride: 102 mmol/L (ref 101–111)
GFR, EST AFRICAN AMERICAN: 45 mL/min — AB (ref >60)
GFR, EST NON AFRICAN AMERICAN: 39 mL/min — AB (ref >60)
Glucose, Bld: 193 mg/dL — ABNORMAL HIGH (ref 65–99)
Potassium: 4.5 mmol/L (ref 3.5–5.1)
SODIUM: 135 mmol/L (ref 135–145)

## 2016-06-20 LAB — HEMOGLOBIN A1C
Hgb A1c MFr Bld: 5.9 % — ABNORMAL HIGH (ref 4.8–5.6)
MEAN PLASMA GLUCOSE: 123 mg/dL

## 2016-06-20 MED ORDER — BISACODYL 10 MG RE SUPP
10.0000 mg | Freq: Once | RECTAL | Status: AC
  Administered 2016-06-20: 10 mg via RECTAL
  Filled 2016-06-20: qty 1

## 2016-06-20 MED ORDER — SENNA 8.6 MG PO TABS
1.0000 | ORAL_TABLET | Freq: Every day | ORAL | Status: DC
  Administered 2016-06-21 – 2016-06-23 (×3): 8.6 mg via ORAL
  Filled 2016-06-20 (×4): qty 1

## 2016-06-20 NOTE — Progress Notes (Signed)
Patient ID: [name], female   DOB: May 26, 1955, 61 y.o.   MRN: [id number]  PROGRESS NOTE    [name]  WUJ:[id number] DOB: 09-Apr-1955 DOA: 06/17/2016  PCP: Rich Fuchs, PA   Brief Narrative:  61 year old female with past medical history of COPD, PE and DVT on xarelto, hepatitis B infection, recent hospitalization for COPD (from 06/09/16 - 06/11/2016) who presented to ED with worsening shortness of breath, dry cough and wheezing for past 2 days prior to this admission. Apparently she ran out of her bronchodilator meds. CXR showed low lung volumes. She was admitted for acute COPD exacerbation.   Assessment & Plan:   Principal Problem: Acute COPD exacerbation ([place]) / Acute respiratory failure with hypoxia - Continue Atrovent and Xopenex every 6 hours scheduled and every 2 hours as needed for shortness of breath or wheezing - Continue Pulmicort and Brovana nebulizer twice daily - Continue Solu-Medrol 60 mg IV every 12 hours - Continue oxygen support via nasal cannula to keep oxygen saturation above 90%   Active Problems:   Hepatitis B virus infection - Continue entecavir    Hyperkalemia - Due to Aldactone - Potassium within normal limits    CKD stage 3 - Platelet creatinine 1.7 in 2015 - Creatinine within baseline range    Essential hypertension - Blood pressure stable - Aldactone held on admission due to initial hyperkalemia    Tobacco abuse - Patient counseled on cessation    Pulmonary embolism ([place]) / History of DVT - Holding Xarelto, patient has dental procedure Tuesday the following week and was told she needs to hold anticoagulation 1 week prior to procedure   DVT prophylaxis: SCDs bilaterally Code Status: full code  Family Communication: No family at the bedside Disposition Plan: Home once respiratory status stable   Consultants:   None   Procedures:   None   Antimicrobials:   None    Subjective: Still wheezing, short of breath with  minimal exertion.   Objective: Vitals:   06/20/16 0507 06/20/16 0540 06/20/16 0616 06/20/16 0758  BP: (!) 189/94 (!) 149/97 130/80   Pulse: 70 (!) 51 60   Resp: 18     Temp: 97.4 F (36.3 C)     TempSrc: Oral     SpO2: 93% 97%  95%  Weight:      Height:        Intake/Output Summary (Last 24 hours) at 06/20/16 1201 Last data filed at 06/20/16 0700  Gross per 24 hour  Intake             1200 ml  Output                0 ml  Net             1200 ml   Filed Weights   06/17/16 1321  Weight: 102.2 kg (225 lb 5 oz)    Examination:  General exam: Appears calm and comfortable  Respiratory system: wheezing in upper and mid lung lobes  Cardiovascular system: S1 & S2 heard, RRR. No JVD, murmurs, rubs, gallops or clicks. Gastrointestinal system: Abdomen is obese, soft and nontender. No organomegaly or masses felt. Normal bowel sounds heard. Central nervous system: Alert and oriented. No focal neurological deficits. Extremities: Symmetric 5 x 5 power. Skin: No rashes, lesions or ulcers Psychiatry: Judgement and insight appear normal. Mood & affect appropriate.   Data Reviewed: I have personally reviewed following labs and imaging studies  CBC:  Recent Labs  Lab 06/17/16 0927 06/17/16 1009 06/18/16 0404 06/19/16 0947 06/20/16 0402  WBC 6.4  --  10.5 8.4 6.4  NEUTROABS 6.0  --   --   --   --   HGB 14.4 15.3* 13.8 13.9 14.3  HCT 43.0 45.0 42.2 40.6 41.7  MCV 91.9  --  91.9 91.4 91.6  PLT 94*  --  103* 94* 97*   Basic Metabolic Panel:  Recent Labs Lab 06/17/16 1009 06/17/16 1529 06/18/16 0404 06/19/16 0947 06/20/16 0402  NA 136 136 138 133* 135  K 5.9* 4.7 4.7 4.4 4.5  CL 106 105 106 102 102  CO2  --  20* 21* 22 25  GLUCOSE 174* 336* 136* 249* 193*  BUN 56* 37* 31* 36* 39*  CREATININE 1.40* 1.70* 1.32* 1.48* 1.45*  CALCIUM  --  9.1 9.4 9.2 9.2   GFR: Estimated Creatinine Clearance: 48.6 mL/min (A) (by C-G formula based on SCr of 1.45 mg/dL (H)). Liver Function  Tests: No results for input(s): AST, ALT, ALKPHOS, BILITOT, PROT, ALBUMIN in the last 168 hours. No results for input(s): LIPASE, AMYLASE in the last 168 hours. No results for input(s): AMMONIA in the last 168 hours. Coagulation Profile: No results for input(s): INR, PROTIME in the last 168 hours. Cardiac Enzymes: No results for input(s): CKTOTAL, CKMB, CKMBINDEX, TROPONINI in the last 168 hours. BNP (last 3 results) No results for input(s): PROBNP in the last 8760 hours. HbA1C: No results for input(s): HGBA1C in the last 72 hours. CBG:  Recent Labs Lab 06/19/16 1146 06/19/16 1645 06/19/16 1951 06/20/16 0740 06/20/16 1154  GLUCAP 144* 130* 147* 132* 197*   Lipid Profile: No results for input(s): CHOL, HDL, LDLCALC, TRIG, CHOLHDL, LDLDIRECT in the last 72 hours. Thyroid Function Tests: No results for input(s): TSH, T4TOTAL, FREET4, T3FREE, THYROIDAB in the last 72 hours. Anemia Panel: No results for input(s): VITAMINB12, FOLATE, FERRITIN, TIBC, IRON, RETICCTPCT in the last 72 hours. Urine analysis:    Component Value Date/Time   COLORURINE STRAW (A) 06/10/2016 2148   APPEARANCEUR CLEAR 06/10/2016 2148   LABSPEC 1.006 06/10/2016 2148   PHURINE 6.0 06/10/2016 2148   GLUCOSEU NEGATIVE 06/10/2016 2148   HGBUR LARGE (A) 06/10/2016 2148   BILIRUBINUR NEGATIVE 06/10/2016 2148   KETONESUR NEGATIVE 06/10/2016 2148   PROTEINUR NEGATIVE 06/10/2016 2148   UROBILINOGEN 1.0 04/03/2014 1645   NITRITE NEGATIVE 06/10/2016 2148   LEUKOCYTESUR NEGATIVE 06/10/2016 2148   Sepsis Labs: @LABRCNTIP (procalcitonin:4,lacticidven:4)   )No results found for this or any previous visit (from the past 240 hour(s)).    Radiology Studies: Dg Chest 2 View  Result Date: 06/17/2016 CLINICAL DATA:  Shortness of breath. Patient was recently hospitalized for respiratory infection (discharged home last Sunday). Patient states her symptoms are not getting better. Hx of COPD, PE and DVT, heart murmur,  hypertension, pneumonia. Smoker x 35 years EXAM: CHEST  2 VIEW COMPARISON:  06/09/2016 FINDINGS: Shallow inspiration with linear atelectasis in the lung bases. No airspace disease in the lungs. No blunting of costophrenic angles. No pneumothorax. Normal heart size and pulmonary vascularity. Tortuous aorta. Degenerative changes in the spine. IMPRESSION: Shallow inspiration with atelectasis in the lung bases. Electronically Signed   By: Lucienne Capers M.D.   On: 06/17/2016 05:42        Scheduled Meds: . arformoterol  15 mcg Nebulization BID  . budesonide (PULMICORT) nebulizer solution  0.25 mg Nebulization BID  . entecavir  0.5 mg Oral QODAY  . fluticasone  2 spray Each Nare Daily  .  guaiFENesin  600 mg Oral BID  . insulin aspart  0-9 Units Subcutaneous TID WC  . ipratropium  0.5 mg Nebulization Q6H  . lamoTRIgine  100 mg Oral q morning - 10a  . levalbuterol  1.25 mg Nebulization Q6H  . loratadine  10 mg Oral Daily  . lurasidone  40 mg Oral QAC breakfast  . methylPREDNISolone (SOLU-MEDROL) injection  60 mg Intravenous Q12H  . pantoprazole  40 mg Oral Daily  . senna  1 tablet Oral Daily  . sodium chloride flush  3 mL Intravenous Q12H  . Vilazodone HCl  20 mg Oral Daily   Continuous Infusions: . sodium chloride       LOS: 3 days    Time spent: 25 minutes  Greater than 50% of the time spent on counseling and coordinating the care.   Leisa Lenz, MD [facility] Pager [phone]  If 7PM-7AM, please contact night-coverage [email or web address] Password [email or web address] 06/20/2016, 12:01 PM

## 2016-06-21 DIAGNOSIS — K7460 Unspecified cirrhosis of liver: Secondary | ICD-10-CM

## 2016-06-21 LAB — GLUCOSE, CAPILLARY
GLUCOSE-CAPILLARY: 144 mg/dL — AB (ref 65–99)
Glucose-Capillary: 182 mg/dL — ABNORMAL HIGH (ref 65–99)
Glucose-Capillary: 204 mg/dL — ABNORMAL HIGH (ref 65–99)
Glucose-Capillary: 132 mg/dL — ABNORMAL HIGH (ref 65–99)

## 2016-06-21 MED ORDER — LEVOFLOXACIN IN D5W 500 MG/100ML IV SOLN
500.0000 mg | INTRAVENOUS | Status: DC
  Administered 2016-06-21 – 2016-06-23 (×3): 500 mg via INTRAVENOUS
  Filled 2016-06-21 (×3): qty 100

## 2016-06-21 NOTE — Progress Notes (Signed)
PROGRESS NOTE  [name] WUX:[id number] DOB: 28-Sep-1955 DOA: 06/17/2016 PCP: Rich Fuchs, PA   LOS: 4 days   Brief Narrative / Interim history: 61 year old female with past medical history of COPD, PE and DVT on xarelto, hepatitis B infection, recent hospitalization for COPD (from 06/09/16 - 06/11/2016) who presented to ED with worsening shortness of breath, dry cough and wheezing for past 2 days prior to this admission. Apparently she ran out of her bronchodilator meds. CXR showed low lung volumes. She was admitted for acute COPD exacerbation.   Assessment & Plan: Principal Problem:   COPD exacerbation ([place]) Active Problems:   Hepatitis B virus infection   Essential hypertension   Hepatic cirrhosis (HCC)   Tobacco abuse   Pulmonary embolism (HCC)   Thrombocytopenia (HCC)   Acute COPD exacerbation ([place]) / Acute respiratory failure with hypoxia -Continue Atrovent and Xopenex, Pulmicort, Brovana, still has significant wheezing audible from wintering the room, keep on IV steroids today -Continue oxygen, wean off as tolerated  Hepatitis B virus infection -Continue entecavir  Hyperkalemia -Due to Aldactone -Now within normal limitsK   CKD stage 3 -Creatinine at baseline, 1.45  Essential hypertension -Blood pressure stable -Aldactone held on admission due to initial hyperkalemia  Tobacco abuse -Patient counseled on cessation  Pulmonary embolism ([place]) / History of DVT - Holding Xarelto, patient has dental procedure Tuesday the following week and was told she needs to hold anticoagulation 1 week prior to procedure   DVT prophylaxis: SCD (allergic to heparin) Code Status: Full code Family Communication: no family at bedside Disposition Plan: home when ready   Consultants:   None   Procedures:   None   Antimicrobials:  Levaquin 5/30 >>   Subjective: -Continues to complain of shortness of breath, complains of a cough as well  Objective: Vitals:   06/20/16 1944 06/20/16 2037 06/21/16 0505 06/21/16 0806  BP:  140/78 (!) 156/78   Pulse:  70 68   Resp:  18 18   Temp:  98 F (36.7 C) 98 F (36.7 C)   TempSrc:  Oral Oral   SpO2: 96% 98% 97% 94%  Weight:      Height:        Intake/Output Summary (Last 24 hours) at 06/21/16 1349 Last data filed at 06/21/16 1038  Gross per 24 hour  Intake             2040 ml  Output                0 ml  Net             2040 ml   Filed Weights   06/17/16 1321  Weight: 102.2 kg (225 lb 5 oz)    Examination:  Vitals:   06/20/16 1944 06/20/16 2037 06/21/16 0505 06/21/16 0806  BP:  140/78 (!) 156/78   Pulse:  70 68   Resp:  18 18   Temp:  98 F (36.7 C) 98 F (36.7 C)   TempSrc:  Oral Oral   SpO2: 96% 98% 97% 94%  Weight:      Height:        Constitutional: Appears in mild distress, wheezing Eyes: lids and conjunctivae normal ENMT: Mucous membranes are moist.  Respiratory: Diffuse bilateral wheezing, no crackles Cardiovascular: Regular rate and rhythm, no murmurs / rubs / gallops. No LE edema.  Abdomen: no tenderness. Bowel sounds positive.  Skin: no rashes, lesions, ulcers. No induration   Data Reviewed: I have  personally reviewed following labs and imaging studies  CBC:  Recent Labs Lab 06/17/16 0927 06/17/16 1009 06/18/16 0404 06/19/16 0947 06/20/16 0402  WBC 6.4  --  10.5 8.4 6.4  NEUTROABS 6.0  --   --   --   --   HGB 14.4 15.3* 13.8 13.9 14.3  HCT 43.0 45.0 42.2 40.6 41.7  MCV 91.9  --  91.9 91.4 91.6  PLT 94*  --  103* 94* 97*   Basic Metabolic Panel:  Recent Labs Lab 06/17/16 1009 06/17/16 1529 06/18/16 0404 06/19/16 0947 06/20/16 0402  NA 136 136 138 133* 135  K 5.9* 4.7 4.7 4.4 4.5  CL 106 105 106 102 102  CO2  --  20* 21* 22 25  GLUCOSE 174* 336* 136* 249* 193*  BUN 56* 37* 31* 36* 39*  CREATININE 1.40* 1.70* 1.32* 1.48* 1.45*  CALCIUM  --  9.1 9.4 9.2 9.2   GFR: Estimated Creatinine Clearance: 48.6 mL/min (A) (by C-G formula based on SCr of  1.45 mg/dL (H)). Liver Function Tests: No results for input(s): AST, ALT, ALKPHOS, BILITOT, PROT, ALBUMIN in the last 168 hours. No results for input(s): LIPASE, AMYLASE in the last 168 hours. No results for input(s): AMMONIA in the last 168 hours. Coagulation Profile: No results for input(s): INR, PROTIME in the last 168 hours. Cardiac Enzymes: No results for input(s): CKTOTAL, CKMB, CKMBINDEX, TROPONINI in the last 168 hours. BNP (last 3 results) No results for input(s): PROBNP in the last 8760 hours. HbA1C: No results for input(s): HGBA1C in the last 72 hours. CBG:  Recent Labs Lab 06/20/16 1154 06/20/16 1622 06/20/16 2010 06/21/16 0729 06/21/16 1212  GLUCAP 197* 158* 174* 144* 182*   Lipid Profile: No results for input(s): CHOL, HDL, LDLCALC, TRIG, CHOLHDL, LDLDIRECT in the last 72 hours. Thyroid Function Tests: No results for input(s): TSH, T4TOTAL, FREET4, T3FREE, THYROIDAB in the last 72 hours. Anemia Panel: No results for input(s): VITAMINB12, FOLATE, FERRITIN, TIBC, IRON, RETICCTPCT in the last 72 hours. Urine analysis:    Component Value Date/Time   COLORURINE STRAW (A) 06/10/2016 2148   APPEARANCEUR CLEAR 06/10/2016 2148   LABSPEC 1.006 06/10/2016 2148   PHURINE 6.0 06/10/2016 2148   GLUCOSEU NEGATIVE 06/10/2016 2148   HGBUR LARGE (A) 06/10/2016 2148   BILIRUBINUR NEGATIVE 06/10/2016 2148   [place] 06/10/2016 2148   PROTEINUR NEGATIVE 06/10/2016 2148   UROBILINOGEN 1.0 04/03/2014 1645   NITRITE NEGATIVE 06/10/2016 2148   LEUKOCYTESUR NEGATIVE 06/10/2016 2148   Sepsis Labs: Invalid input(s): PROCALCITONIN, LACTICIDVEN  No results found for this or any previous visit (from the past 240 hour(s)).    Radiology Studies: No results found.   Scheduled Meds: . arformoterol  15 mcg Nebulization BID  . budesonide (PULMICORT) nebulizer solution  0.25 mg Nebulization BID  . entecavir  0.5 mg Oral QODAY  . fluticasone  2 spray Each Nare Daily  .  guaiFENesin  600 mg Oral BID  . insulin aspart  0-9 Units Subcutaneous TID WC  . ipratropium  0.5 mg Nebulization Q6H  . lamoTRIgine  100 mg Oral q morning - 10a  . levalbuterol  1.25 mg Nebulization Q6H  . loratadine  10 mg Oral Daily  . lurasidone  40 mg Oral QAC breakfast  . methylPREDNISolone (SOLU-MEDROL) injection  60 mg Intravenous Q12H  . pantoprazole  40 mg Oral Daily  . senna  1 tablet Oral Daily  . sodium chloride flush  3 mL Intravenous Q12H  . Vilazodone HCl  20 mg Oral Daily   Continuous Infusions: . sodium chloride       Marzetta Board, MD, PhD [facility] Pager [phone]  If 7PM-7AM, please contact night-coverage [email or web address] Password [email or web address] 06/21/2016, 1:49 PM

## 2016-06-22 LAB — GLUCOSE, CAPILLARY
GLUCOSE-CAPILLARY: 174 mg/dL — AB (ref 65–99)
GLUCOSE-CAPILLARY: 168 mg/dL — AB (ref 65–99)
Glucose-Capillary: 142 mg/dL — ABNORMAL HIGH (ref 65–99)
Glucose-Capillary: 226 mg/dL — ABNORMAL HIGH (ref 65–99)

## 2016-06-22 MED ORDER — PREDNISONE 20 MG PO TABS
40.0000 mg | ORAL_TABLET | Freq: Every day | ORAL | Status: DC
  Administered 2016-06-22 – 2016-06-23 (×2): 40 mg via ORAL
  Filled 2016-06-22 (×2): qty 2

## 2016-06-22 NOTE — Care Management Important Message (Signed)
Important Message  Patient Details  Name: MYRKA SYLVA MRN: [id number] Date of Birth: [birth year]   Medicare Important Message Given:  Yes    Kerin Salen 06/22/2016, 9:50 [place] Message  Patient Details  Name: NEETI KNUDTSON MRN: [id number] Date of Birth: 04-20-1955   Medicare Important Message Given:  Yes    Kerin Salen 06/22/2016, 9:50 AM

## 2016-06-22 NOTE — Progress Notes (Signed)
PROGRESS NOTE  [name] PJA:[id number] DOB: Mar 15, 1955 DOA: 06/17/2016 PCP: Rich Fuchs, PA   LOS: 5 days   Brief Narrative / Interim history: 61 year old female with past medical history of COPD, PE and DVT on xarelto, hepatitis B infection, recent hospitalization for COPD (from 06/09/16 - 06/11/2016) who presented to ED with worsening shortness of breath, dry cough and wheezing for past 2 days prior to this admission. Apparently she ran out of her bronchodilator meds. CXR showed low lung volumes. She was admitted for acute COPD exacerbation.   Assessment & Plan: Principal Problem:   COPD exacerbation ([place]) Active Problems:   Hepatitis B virus infection   Essential hypertension   Hepatic cirrhosis (HCC)   Tobacco abuse   Pulmonary embolism (HCC)   Thrombocytopenia (HCC)   Acute COPD exacerbation ([place]) / Acute respiratory failure with hypoxia -Continue Atrovent and Xopenex, Pulmicort, Brovana, wheezing has improved, transition to po steroids, if stable will go home in am  -oxygen weaned off, on room air today -still with dyspnea with ambulation  Hepatitis B virus infection -Continue entecavir  Hyperkalemia -Due to Aldactone -Now K within normal limitsK  CKD stage 3 -Creatinine at baseline  Essential hypertension -Blood pressure stable -Aldactone held on admission due to initial hyperkalemia  Tobacco abuse -Patient counseled on cessation  Pulmonary embolism ([place]) / History of DVT - Holding Xarelto, patient has dental procedure Tuesday the following week and was told she needs to hold anticoagulation 1 week prior to procedure   DVT prophylaxis: SCD (allergic to heparin) Code Status: Full code Family Communication: no family at bedside Disposition Plan: home 1 day if respiratory status is stable Consultants:   None   Procedures:   None   Antimicrobials:  Levaquin 5/30 >>   Subjective: -still complains of wheezing on and off, dyspneic with  ambulation but overall improving  Objective: Vitals:   06/21/16 2057 06/22/16 0153 06/22/16 0606 06/22/16 0737  BP: (!) 142/82  (!) 154/87   Pulse: 65  64   Resp: 16  16   Temp: 98.8 F (37.1 C)  98.9 F (37.2 C)   TempSrc: Oral  Oral   SpO2: 95% 97% 97% 95%  Weight:      Height:        Intake/Output Summary (Last 24 hours) at 06/22/16 1219 Last data filed at 06/22/16 0926  Gross per 24 hour  Intake             1800 ml  Output                0 ml  Net             1800 ml   Filed Weights   06/17/16 1321  Weight: 102.2 kg (225 lb 5 oz)    Examination:  Vitals:   06/21/16 2057 06/22/16 0153 06/22/16 0606 06/22/16 0737  BP: (!) 142/82  (!) 154/87   Pulse: 65  64   Resp: 16  16   Temp: 98.8 F (37.1 C)  98.9 F (37.2 C)   TempSrc: Oral  Oral   SpO2: 95% 97% 97% 95%  Weight:      Height:       Constitutional: appears comfortable Respiratory: clear to auscultation bilaterally, no wheezing today Cardiovascular: Regular rate and rhythm, no murmurs / rubs / gallops.   Data Reviewed: I have personally reviewed following labs and imaging studies  CBC:  Recent Labs Lab 06/17/16 0927 06/17/16 1009 06/18/16 0404  06/19/16 0947 06/20/16 0402  WBC 6.4  --  10.5 8.4 6.4  NEUTROABS 6.0  --   --   --   --   HGB 14.4 15.3* 13.8 13.9 14.3  HCT 43.0 45.0 42.2 40.6 41.7  MCV 91.9  --  91.9 91.4 91.6  PLT 94*  --  103* 94* 97*   Basic Metabolic Panel:  Recent Labs Lab 06/17/16 1009 06/17/16 1529 06/18/16 0404 06/19/16 0947 06/20/16 0402  NA 136 136 138 133* 135  K 5.9* 4.7 4.7 4.4 4.5  CL 106 105 106 102 102  CO2  --  20* 21* 22 25  GLUCOSE 174* 336* 136* 249* 193*  BUN 56* 37* 31* 36* 39*  CREATININE 1.40* 1.70* 1.32* 1.48* 1.45*  CALCIUM  --  9.1 9.4 9.2 9.2   GFR: Estimated Creatinine Clearance: 48.6 mL/min (A) (by C-G formula based on SCr of 1.45 mg/dL (H)). Liver Function Tests: No results for input(s): AST, ALT, ALKPHOS, BILITOT, PROT, ALBUMIN in  the last 168 hours. No results for input(s): LIPASE, AMYLASE in the last 168 hours. No results for input(s): AMMONIA in the last 168 hours. Coagulation Profile: No results for input(s): INR, PROTIME in the last 168 hours. Cardiac Enzymes: No results for input(s): CKTOTAL, CKMB, CKMBINDEX, TROPONINI in the last 168 hours. BNP (last 3 results) No results for input(s): PROBNP in the last 8760 hours. HbA1C: No results for input(s): HGBA1C in the last 72 hours. CBG:  Recent Labs Lab 06/21/16 0729 06/21/16 1212 06/21/16 1753 06/21/16 2053 06/22/16 0751  GLUCAP 144* 182* 204* 132* 142*   Lipid Profile: No results for input(s): CHOL, HDL, LDLCALC, TRIG, CHOLHDL, LDLDIRECT in the last 72 hours. Thyroid Function Tests: No results for input(s): TSH, T4TOTAL, FREET4, T3FREE, THYROIDAB in the last 72 hours. Anemia Panel: No results for input(s): VITAMINB12, FOLATE, FERRITIN, TIBC, IRON, RETICCTPCT in the last 72 hours. Urine analysis:    Component Value Date/Time   COLORURINE STRAW (A) 06/10/2016 2148   APPEARANCEUR CLEAR 06/10/2016 2148   LABSPEC 1.006 06/10/2016 2148   PHURINE 6.0 06/10/2016 2148   GLUCOSEU NEGATIVE 06/10/2016 2148   HGBUR LARGE (A) 06/10/2016 2148   BILIRUBINUR NEGATIVE 06/10/2016 2148   [place] 06/10/2016 2148   PROTEINUR NEGATIVE 06/10/2016 2148   UROBILINOGEN 1.0 04/03/2014 1645   NITRITE NEGATIVE 06/10/2016 2148   LEUKOCYTESUR NEGATIVE 06/10/2016 2148   Sepsis Labs: Invalid input(s): PROCALCITONIN, LACTICIDVEN  No results found for this or any previous visit (from the past 240 hour(s)).    Radiology Studies: No results found.   Scheduled Meds: . arformoterol  15 mcg Nebulization BID  . budesonide (PULMICORT) nebulizer solution  0.25 mg Nebulization BID  . entecavir  0.5 mg Oral QODAY  . fluticasone  2 spray Each Nare Daily  . guaiFENesin  600 mg Oral BID  . insulin aspart  0-9 Units Subcutaneous TID WC  . ipratropium  0.5 mg  Nebulization Q6H  . lamoTRIgine  100 mg Oral q morning - 10a  . levalbuterol  1.25 mg Nebulization Q6H  . loratadine  10 mg Oral Daily  . lurasidone  40 mg Oral QAC breakfast  . pantoprazole  40 mg Oral Daily  . predniSONE  40 mg Oral Q breakfast  . senna  1 tablet Oral Daily  . sodium chloride flush  3 mL Intravenous Q12H  . Vilazodone HCl  20 mg Oral Daily   Continuous Infusions: . sodium chloride    . levofloxacin (LEVAQUIN) IV Stopped (  06/21/16 1701)     Marzetta Board, MD, PhD [facility] Pager [phone]  If 7PM-7AM, please contact night-coverage [email or web address] Password [email or web address] 06/22/2016, 12:19 PM

## 2016-06-22 NOTE — Care Management Note (Signed)
Case Management Note  Patient Details  Name: SAMRA PESCH MRN: [id number] Date of Birth: 1955/12/19  Subjective/Objective:    61 yo admitted with COPD exacerbation.                Action/Plan: From home with family. Independent with ADLs prior to admission.  Expected Discharge Date:                  Expected Discharge Plan:  Home/Self Care  In-House Referral:     Discharge planning Services  CM Consult  Post Acute Care Choice:    Choice offered to:     DME Arranged:    DME Agency:     HH Arranged:    HH Agency:     Status of Service:  In process, will continue to follow  If discussed at Long Length of Stay Meetings, dates discussed:    Additional CommentsLynnell Catalan, RN 06/22/2016, 12:31 PM  [phone]

## 2016-06-23 LAB — GLUCOSE, CAPILLARY
GLUCOSE-CAPILLARY: 111 mg/dL — AB (ref 65–99)
GLUCOSE-CAPILLARY: 195 mg/dL — AB (ref 65–99)

## 2016-06-23 MED ORDER — TIOTROPIUM BROMIDE MONOHYDRATE 18 MCG IN CAPS: 18.0000 ug | ORAL_CAPSULE | Freq: Every day | RESPIRATORY_TRACT | 1 refills | Status: DC

## 2016-06-23 MED ORDER — ALBUTEROL SULFATE (2.5 MG/3ML) 0.083% IN NEBU: 2.5000 mg | INHALATION_SOLUTION | RESPIRATORY_TRACT | 1 refills | Status: DC | PRN

## 2016-06-23 MED ORDER — OXYCODONE-ACETAMINOPHEN 7.5-325 MG PO TABS: 1.0000 | ORAL_TABLET | Freq: Four times a day (QID) | ORAL | 0 refills | Status: DC | PRN

## 2016-06-23 MED ORDER — LEVOFLOXACIN 500 MG PO TABS: 500.0000 mg | ORAL_TABLET | Freq: Every day | ORAL | 0 refills | Status: AC

## 2016-06-23 MED ORDER — BUDESONIDE-FORMOTEROL FUMARATE 160-4.5 MCG/ACT IN AERO: 2.0000 | INHALATION_SPRAY | Freq: Two times a day (BID) | RESPIRATORY_TRACT | 1 refills | Status: DC

## 2016-06-23 MED ORDER — ALBUTEROL SULFATE HFA 108 (90 BASE) MCG/ACT IN AERS: INHALATION_SPRAY | RESPIRATORY_TRACT | 1 refills | Status: DC

## 2016-06-23 MED ORDER — PREDNISONE 10 MG PO TABS: ORAL_TABLET | ORAL | 0 refills | Status: DC

## 2016-06-23 NOTE — Discharge Instructions (Signed)
Follow with Rich Fuchs, PA in 5-7 days  Please get a complete blood count and chemistry panel checked by your Primary MD at your next visit, and again as instructed by your Primary MD. Please get your medications reviewed and adjusted by your Primary MD.  Please request your Primary MD to go over all Hospital Tests and Procedure/Radiological results at the follow up, please get all Hospital records sent to your Prim MD by signing hospital release before you go home.  If you had Pneumonia of Lung problems at the Hospital: Please get a 2 view Chest X ray done in 6-8 weeks after hospital discharge or sooner if instructed by your Primary MD.  If you have Congestive Heart Failure: Please call your Cardiologist or Primary MD anytime you have any of the following symptoms:  1) 3 pound weight gain in 24 hours or 5 pounds in 1 week  2) shortness of breath, with or without a dry hacking cough  3) swelling in the hands, feet or stomach  4) if you have to sleep on extra pillows at night in order to breathe  Follow cardiac low salt diet and 1.5 lit/day fluid restriction.  If you have diabetes Accuchecks 4 times/day, Once in AM empty stomach and then before each meal. Log in all results and show them to your primary doctor at your next visit. If any glucose reading is under 80 or above 300 call your primary MD immediately.  If you have Seizure/Convulsions/Epilepsy: Please do not drive, operate heavy machinery, participate in activities at heights or participate in high speed sports until you have seen by Primary MD or a Neurologist and advised to do so again.  If you had Gastrointestinal Bleeding: Please ask your Primary MD to check a complete blood count within one week of discharge or at your next visit. Your endoscopic/colonoscopic biopsies that are pending at the time of discharge, will also need to followed by your Primary MD.  Get Medicines reviewed and adjusted. Please take all your  medications with you for your next visit with your Primary MD  Please request your Primary MD to go over all hospital tests and procedure/radiological results at the follow up, please ask your Primary MD to get all Hospital records sent to his/her office.  If you experience worsening of your admission symptoms, develop shortness of breath, life threatening emergency, suicidal or homicidal thoughts you must seek medical attention immediately by calling 911 or calling your MD immediately  if symptoms less severe.  You must read complete instructions/literature along with all the possible adverse reactions/side effects for all the Medicines you take and that have been prescribed to you. Take any new Medicines after you have completely understood and accpet all the possible adverse reactions/side effects.   Do not drive or operate heavy machinery when taking Pain medications.   Do not take more than prescribed Pain, Sleep and Anxiety Medications  Special Instructions: If you have smoked or chewed Tobacco  in the last 2 yrs please stop smoking, stop any regular Alcohol  and or any Recreational drug use.  Wear Seat belts while driving.  Please note You were cared for by a hospitalist during your hospital stay. If you have any questions about your discharge medications or the care you received while you were in the hospital after you are discharged, you can call the unit and asked to speak with the hospitalist on call if the hospitalist that took care of you is not available.  Once you are discharged, your primary care physician will handle any further medical issues. Please note that NO REFILLS for any discharge medications will be authorized once you are discharged, as it is imperative that you return to your primary care physician (or establish a relationship with a primary care physician if you do not have one) for your aftercare needs so that they can reassess your need for medications and monitor your  lab values. ° °You can reach the hospitalist office at phone [phone] or fax [phone] °  °If you do not have a primary care physician, you can call [phone] for a physician referral. ° °Activity: As tolerated with Full fall precautions use walker/cane & assistance as needed ° °Diet: regular ° °Disposition Home ° ° °

## 2016-06-23 NOTE — Discharge Summary (Signed)
Physician Discharge Summary  [name] PRX:[id number] DOB: [birth year] DOA: 06/17/2016  PCP: Rich Fuchs, PA  Admit date: 06/17/2016 Discharge date: 06/23/2016  Admitted From: home Disposition:  home  Recommendations for Outpatient Follow-up:  1. Follow up with PCP in 1-2 weeks 2. Prednisone taper and Levaquin on d/c for few more days as below  Home Health: none Equipment/Devices: none  Discharge Condition: stable CODE STATUS: Full code Diet recommendation: heart healthy  HPI: Per Dr. Sloan Leiter, [name] is a 61 y.o. female with medical history significant COPD, tobacco abuse-quit one week back, hepatitis B and liver cirrhosis followed by hepatology clinic at [facility] Entecavir brought to the ED for evaluation of the above-noted complaints. Patient was just discharged from this facility approximately 6 days back, upon discharge she was doing good until a few days back. Approximately 2 days of those, she slowly started having worsening of her shortness of breath. She claims that she ran out of her regular nebulizer medications. Shortness of breath is mostly worse on exertion, and has gradually worsened in the past day or so, prompting ED visit today. She also has a cough that is mostly dry. She also complains of some nasal congestion. She denies any fever. She denies any chest pain, nausea, vomiting or diarrhea. In the emergency room, she was given steroids, bronchodilators-felt much better but not yet back to her baseline. She was ambulated and dropped her O2 saturation to the mid 80s. As a result the hospitalist service was asked to admit this patient for further evaluation and treatment.   Hospital Course: Discharge Diagnoses:  Principal Problem:   COPD exacerbation ([place]) Active Problems:   Hepatitis B virus infection   Essential hypertension   Hepatic cirrhosis (H[facility])   Tobacco abuse   Pulmonary embolism (H[facility])   Thrombocytopenia ([facility])   Acute COPD exacerbation ([place]) /  Acute respiratory failure with hypoxia -patient was admitted to the hospital with acute COPD exacerbation, was started on IV steroids, antibiotics as well as scheduled nebulizers.  She initially required supplemental oxygen.  With supportive care, she was slow to improve however was eventually be able to be weaned off to room air, her wheezing is resolved, she was back to baseline unable to ambulate on room air, she was discharged home in stable condition. Hepatitis B virus infection -Continue entecavir Hyperkalemia -Due to Aldactone, hold on discharge, recommend PCP follow-up with repeat labs and reevaluation whether she should be back on this CKD stage 3 -Creatinine at baseline Essential hypertension -Blood pressure stable Tobacco abuse -Patient counseled on cessation, she states that she quit about 14 days ago, and does not plan to resume smoking Pulmonary embolism ([place]) / History of DVT - Holding Xarelto, patient has dental procedure Tuesday the following week and was told she needs to hold anticoagulation 1 week prior to procedure    Discharge Instructions   Allergies as of 06/23/2016      Reactions   Heparin Other (See Comments)   HIT ab positive, SRA negative   Amitriptyline Hcl Other (See Comments)   Causes her to be very "disoriented".   Gabapentin Rash, Other (See Comments)   REACTION: rash in mouth   Cymbalta [duloxetine Hcl] Other (See Comments)   headaches   Neomycin-bacitracin Zn-polymyx Rash      Medication List    STOP taking these medications   AEROCHAMBER MV inhaler   spironolactone 50 MG tablet Commonly known as:  ALDACTONE     TAKE these medications  albuterol (2.5 MG/3ML) 0.083% nebulizer solution Commonly known as:  PROVENTIL Take 3 mLs (2.5 mg total) by nebulization every 4 (four) hours as needed for wheezing or shortness of breath.   albuterol 108 (90 Base) MCG/ACT inhaler Commonly known as:  PROAIR HFA INHALE 2 PUFFS INTO THE LUNGS EVERY 4 HOURS AS  NEEDED FOR WHEEZING   ALPRAZolam 1 MG tablet Commonly known as:  XANAX Take 0.5 tablets (0.5 mg total) by mouth 2 (two) times daily as needed for anxiety. What changed:  how much to take  when to take this   budesonide-formoterol 160-4.5 MCG/ACT inhaler Commonly known as:  SYMBICORT Inhale 2 puffs into the lungs 2 (two) times daily.   entecavir 0.5 MG tablet Commonly known as:  BARACLUDE Take 0.5 mg by mouth every other day.   ibuprofen 800 MG tablet Commonly known as:  ADVIL,MOTRIN Take 1 tablet (800 mg total) by mouth 3 (three) times daily.   lamoTRIgine 100 MG tablet Commonly known as:  LAMICTAL Take 100 mg by mouth every morning.   LATUDA 40 MG Tabs tablet Generic drug:  lurasidone Take 40 mg by mouth daily.   levofloxacin 500 MG tablet Commonly known as:  LEVAQUIN Take 1 tablet (500 mg total) by mouth daily.   nicotine 21 mg/24hr patch Commonly known as:  NICODERM CQ - dosed in mg/24 hours Place 1 patch (21 mg total) onto the skin daily.   oxyCODONE-acetaminophen 7.5-325 MG tablet Commonly known as:  PERCOCET Take 1 tablet by mouth every 6 (six) hours as needed for severe pain.   pantoprazole 40 MG tablet Commonly known as:  PROTONIX Take 40 mg by mouth daily.   predniSONE 10 MG tablet Commonly known as:  DELTASONE Take 4 tablets for 3 days; Take 3 tablets for 4 days; Take 2 tablets for 3 days; Take 1 tablet for 4 days   rivaroxaban 20 MG Tabs tablet Commonly known as:  XARELTO Take 20 mg by mouth daily.   tiotropium 18 MCG inhalation capsule Commonly known as:  SPIRIVA Place 1 capsule (18 mcg total) into inhaler and inhale daily.   VIIBRYD STARTER PACK 10 & 20 MG Kit Generic drug:  Vilazodone HCl See admin instructions. see package      Follow-up Information    Rich Fuchs, PA. Schedule an appointment as soon as possible for a visit in 1 week(s).   Specialty:  Physician Assistant Contact information: [place]  [id number] [phone]          Allergies  Allergen Reactions  . 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 Amitriptyline Hcl Other (See Comments)     Causes her to be very "disoriented".  . Gabapentin Rash and Other (See Comments)    REACTION: rash in mouth   . Cymbalta [Duloxetine Hcl] Other (See Comments)    headaches  . Neomycin-Bacitracin Zn-Polymyx Rash    Consultations:  None   Procedures/Studies:  Dg Chest 2 View  Result Date: 06/17/2016 CLINICAL DATA:  Shortness of breath. Patient was recently hospitalized for respiratory infection (discharged home last Sunday). Patient states her symptoms are not getting better. Hx of COPD, PE and DVT, heart murmur, hypertension, pneumonia. Smoker x 35 years EXAM: CHEST  2 VIEW COMPARISON:  06/09/2016 FINDINGS: Shallow inspiration with linear atelectasis in the lung bases. No airspace disease in the lungs. No blunting of costophrenic angles. No pneumothorax. Normal heart size and pulmonary vascularity. Tortuous aorta. Degenerative  changes in the spine. IMPRESSION: Shallow inspiration with atelectasis in the lung bases. Electronically Signed   By: Lucienne Capers M.D.   On: 06/17/2016 05:42   Dg Chest 2 View  Result Date: 06/09/2016 CLINICAL DATA:  61 year old female with shortness of breath, cough and chest pain for 1 week EXAM: CHEST  2 VIEW COMPARISON:  Prior chest x-ray and CT scan of the chest 01/01/2016 FINDINGS: Cardiac and mediastinal contours are unchanged and remain within normal limits. There is an interval increase in interstitial prominence, particularly in both lung bases with streaky linear opacities most consistent with atelectasis. Background chronic bronchitic changes and hyperinflation appear similar. No overt edema, focal airspace consolidation, pleural effusion or pneumothorax. No acute osseous abnormality. IMPRESSION: 1. Slightly increased interstitial prominence with new bibasilar subsegmental  atelectasis. Findings are concerning for COPD exacerbation versus viral or atypical respiratory infection. Electronically Signed   By: Jacqulynn Cadet M.D.   On: 06/09/2016 13:51    Subjective: - no chest pain, shortness of breath, no abdominal pain, nausea or vomiting.   Discharge Exam: Vitals:   06/22/16 2101 06/23/16 0622  BP: (!) 147/73 (!) 144/84  Pulse: 70 (!) 58  Resp: 16 20  Temp: 99.2 F (37.3 C) 98.1 F (36.7 C)   Vitals:   06/23/16 0622 06/23/16 0910 06/23/16 0911 06/23/16 0912  BP: (!) 144/84     Pulse: (!) 58     Resp: 20     Temp: 98.1 F (36.7 C)     TempSrc: Oral     SpO2: 96% 94% 94% 94%  Weight:      Height:        General: Pt is alert, awake, not in acute distress Cardiovascular: RRR, S1/S2 +, no rubs, no gallops Respiratory: CTA bilaterally, no wheezing, no rhonchi Abdominal: Soft, NT, ND, bowel sounds + Extremities: no edema, no cyanosis    The results of significant diagnostics from this hospitalization (including imaging, microbiology, ancillary and laboratory) are listed below for reference.     Microbiology: No results found for this or any previous visit (from the past 240 hour(s)).   Labs: BNP (last 3 results)  Recent Labs  06/09/16 1430  BNP 92.9   Basic Metabolic Panel:  Recent Labs Lab 06/17/16 1009 06/17/16 1529 06/18/16 0404 06/19/16 0947 06/20/16 0402  NA 136 136 138 133* 135  K 5.9* 4.7 4.7 4.4 4.5  CL 106 105 106 102 102  CO2  --  20* 21* 22 25  GLUCOSE 174* 336* 136* 249* 193*  BUN 56* 37* 31* 36* 39*  CREATININE 1.40* 1.70* 1.32* 1.48* 1.45*  CALCIUM  --  9.1 9.4 9.2 9.2   Liver Function Tests: No results for input(s): AST, ALT, ALKPHOS, BILITOT, PROT, ALBUMIN in the last 168 hours. No results for input(s): LIPASE, AMYLASE in the last 168 hours. No results for input(s): AMMONIA in the last 168 hours. CBC:  Recent Labs Lab 06/17/16 0927 06/17/16 1009 06/18/16 0404 06/19/16 0947 06/20/16 0402  WBC  6.4  --  10.5 8.4 6.4  NEUTROABS 6.0  --   --   --   --   HGB 14.4 15.3* 13.8 13.9 14.3  HCT 43.0 45.0 42.2 40.6 41.7  MCV 91.9  --  91.9 91.4 91.6  PLT 94*  --  103* 94* 97*   Cardiac Enzymes: No results for input(s): CKTOTAL, CKMB, CKMBINDEX, TROPONINI in the last 168 hours. BNP: Invalid input(s): POCBNP CBG:  Recent Labs Lab 06/22/16 1231 06/22/16 1621  06/22/16 2104 06/23/16 0811 06/23/16 1210  GLUCAP 226* 174* 168* 111* 195*   D-Dimer No results for input(s): DDIMER in the last 72 hours. Hgb A1c No results for input(s): HGBA1C in the last 72 hours. Lipid Profile No results for input(s): CHOL, HDL, LDLCALC, TRIG, CHOLHDL, LDLDIRECT in the last 72 hours. Thyroid function studies No results for input(s): TSH, T4TOTAL, T3FREE, THYROIDAB in the last 72 hours.  Invalid input(s): FREET3 Anemia work up No results for input(s): VITAMINB12, FOLATE, FERRITIN, TIBC, IRON, RETICCTPCT in the last 72 hours. Urinalysis    Component Value Date/Time   COLORURINE STRAW (A) 06/10/2016 2148   APPEARANCEUR CLEAR 06/10/2016 2148   LABSPEC 1.006 06/10/2016 2148   PHURINE 6.0 06/10/2016 2148   GLUCOSEU NEGATIVE 06/10/2016 2148   HGBUR LARGE (A) 06/10/2016 2148   BILIRUBINUR NEGATIVE 06/10/2016 2148   KETONESUR NEGATIVE 06/10/2016 2148   PROTEINUR NEGATIVE 06/10/2016 2148   UROBILINOGEN 1.0 04/03/2014 1645   NITRITE NEGATIVE 06/10/2016 2148   LEUKOCYTESUR NEGATIVE 06/10/2016 2148   Sepsis Labs Invalid input(s): PROCALCITONIN,  WBC,  LACTICIDVEN Microbiology No results found for this or any previous visit (from the past 240 hour(s)).   Time coordinating discharge: 25 minutes  SIGNED:  Marzetta Board, MD  [facility] 06/23/2016, 1:46 PM Pager [phone]  If 7PM-7AM, please contact night-coverage [email or web address] Password [email or web address]

## 2016-08-10 ENCOUNTER — Other Ambulatory Visit: Payer: Self-pay | Admitting: Pain Medicine

## 2016-08-11 ENCOUNTER — Other Ambulatory Visit: Payer: Self-pay | Admitting: Pain Medicine

## 2016-08-11 DIAGNOSIS — M545 Low back pain: Secondary | ICD-10-CM

## 2016-09-01 ENCOUNTER — Other Ambulatory Visit: Payer: Self-pay

## 2016-09-14 ENCOUNTER — Other Ambulatory Visit: Payer: Self-pay | Admitting: Gastroenterology

## 2016-09-14 ENCOUNTER — Ambulatory Visit
Admission: RE | Admit: 2016-09-14 | Discharge: 2016-09-14 | Disposition: A | Discharge status: Home / Self Care | Payer: Medicare Other | Source: Ambulatory Visit | Attending: Pain Medicine | Admitting: Pain Medicine

## 2016-09-14 ENCOUNTER — Other Ambulatory Visit (HOSPITAL_COMMUNITY): Payer: Self-pay | Admitting: Gastroenterology

## 2016-09-14 DIAGNOSIS — K7469 Other cirrhosis of liver: Secondary | ICD-10-CM

## 2016-09-14 DIAGNOSIS — K7460 Unspecified cirrhosis of liver: Secondary | ICD-10-CM

## 2016-09-14 DIAGNOSIS — M545 Low back pain: Secondary | ICD-10-CM

## 2016-09-18 ENCOUNTER — Other Ambulatory Visit: Payer: Self-pay | Admitting: Obstetrics and Gynecology

## 2016-09-18 DIAGNOSIS — Z1231 Encounter for screening mammogram for malignant neoplasm of breast: Secondary | ICD-10-CM

## 2016-09-26 ENCOUNTER — Ambulatory Visit (HOSPITAL_COMMUNITY): Payer: Medicare Other

## 2016-09-27 ENCOUNTER — Other Ambulatory Visit: Payer: Self-pay

## 2016-10-04 ENCOUNTER — Ambulatory Visit
Admission: RE | Admit: 2016-10-04 | Discharge: 2016-10-04 | Disposition: A | Discharge status: Home / Self Care | Payer: Medicare Other | Source: Ambulatory Visit | Attending: Gastroenterology | Admitting: Gastroenterology

## 2016-10-04 DIAGNOSIS — K7460 Unspecified cirrhosis of liver: Secondary | ICD-10-CM

## 2016-11-09 ENCOUNTER — Ambulatory Visit
Admission: RE | Admit: 2016-11-09 | Discharge: 2016-11-09 | Disposition: A | Discharge status: Home / Self Care | Payer: Medicare Other | Source: Ambulatory Visit | Attending: Obstetrics and Gynecology | Admitting: Obstetrics and Gynecology

## 2016-11-09 DIAGNOSIS — Z1231 Encounter for screening mammogram for malignant neoplasm of breast: Secondary | ICD-10-CM

## 2018-03-05 DIAGNOSIS — G894 Chronic pain syndrome: Secondary | ICD-10-CM | POA: Insufficient documentation

## 2018-03-08 MED ORDER — GENERIC EXTERNAL MEDICATION
2.50 | Status: DC
Start: ? — End: 2018-03-08

## 2018-03-08 MED ORDER — ALUM & MAG HYDROXIDE-SIMETH 200-200-20 MG/5ML PO SUSP
30.00 | ORAL | Status: DC
Start: ? — End: 2018-03-08

## 2018-03-08 MED ORDER — SODIUM CHLORIDE 0.9 % IV SOLN
10.00 | INTRAVENOUS | Status: DC
Start: ? — End: 2018-03-08

## 2018-03-08 MED ORDER — TEMAZEPAM 15 MG PO CAPS
15.00 | ORAL_CAPSULE | ORAL | Status: DC
Start: ? — End: 2018-03-08

## 2018-03-08 MED ORDER — METHYLPREDNISOLONE SODIUM SUCC 125 MG IJ SOLR
125.00 | INTRAMUSCULAR | Status: DC
Start: 2018-03-07 — End: 2018-03-08

## 2018-03-08 MED ORDER — SPIRONOLACTONE 25 MG PO TABS
25.00 | ORAL_TABLET | ORAL | Status: DC
Start: 2018-03-07 — End: 2018-03-08

## 2018-03-08 MED ORDER — ASPIRIN EC 81 MG PO TBEC
81.00 | DELAYED_RELEASE_TABLET | ORAL | Status: DC
Start: 2018-03-08 — End: 2018-03-08

## 2018-03-08 MED ORDER — GENERIC EXTERNAL MEDICATION
2.00 | Status: DC
Start: 2018-03-08 — End: 2018-03-08

## 2018-03-08 MED ORDER — NITROGLYCERIN 0.4 MG SL SUBL
0.40 | SUBLINGUAL_TABLET | SUBLINGUAL | Status: DC
Start: ? — End: 2018-03-08

## 2018-03-08 MED ORDER — OLANZAPINE 5 MG PO TABS
5.00 | ORAL_TABLET | ORAL | Status: DC
Start: 2018-03-07 — End: 2018-03-08

## 2018-03-08 MED ORDER — ALPRAZOLAM 0.5 MG PO TABS
1.00 | ORAL_TABLET | ORAL | Status: DC
Start: ? — End: 2018-03-08

## 2018-03-08 MED ORDER — EZETIMIBE 10 MG PO TABS
10.00 | ORAL_TABLET | ORAL | Status: DC
Start: 2018-03-08 — End: 2018-03-08

## 2018-03-08 MED ORDER — FLUTICASONE PROPIONATE 50 MCG/ACT NA SUSP
2.00 | NASAL | Status: DC
Start: 2018-03-08 — End: 2018-03-08

## 2018-03-08 MED ORDER — OXYCODONE HCL 10 MG PO TABS
10.00 | ORAL_TABLET | ORAL | Status: DC
Start: ? — End: 2018-03-08

## 2018-03-08 MED ORDER — QUETIAPINE FUMARATE 200 MG PO TABS
200.00 | ORAL_TABLET | ORAL | Status: DC
Start: 2018-03-07 — End: 2018-03-08

## 2018-03-08 MED ORDER — FLUOXETINE HCL 20 MG PO CAPS
20.00 | ORAL_CAPSULE | ORAL | Status: DC
Start: 2018-03-07 — End: 2018-03-08

## 2018-03-08 MED ORDER — GENERIC EXTERNAL MEDICATION
10.00 | Status: DC
Start: ? — End: 2018-03-08

## 2018-03-08 MED ORDER — PANTOPRAZOLE SODIUM 40 MG PO TBEC
40.00 | DELAYED_RELEASE_TABLET | ORAL | Status: DC
Start: 2018-03-08 — End: 2018-03-08

## 2018-03-08 MED ORDER — RIVAROXABAN 20 MG PO TABS
20.00 | ORAL_TABLET | ORAL | Status: DC
Start: 2018-03-07 — End: 2018-03-08

## 2018-03-10 DIAGNOSIS — Z86711 Personal history of pulmonary embolism: Secondary | ICD-10-CM | POA: Insufficient documentation

## 2018-03-10 DIAGNOSIS — R1032 Left lower quadrant pain: Secondary | ICD-10-CM | POA: Insufficient documentation

## 2018-03-16 DIAGNOSIS — Z86718 Personal history of other venous thrombosis and embolism: Secondary | ICD-10-CM | POA: Insufficient documentation

## 2018-05-27 DIAGNOSIS — Z7901 Long term (current) use of anticoagulants: Secondary | ICD-10-CM | POA: Insufficient documentation

## 2018-05-27 DIAGNOSIS — R296 Repeated falls: Secondary | ICD-10-CM | POA: Insufficient documentation

## 2018-07-08 DIAGNOSIS — Z7901 Long term (current) use of anticoagulants: Secondary | ICD-10-CM | POA: Insufficient documentation

## 2018-07-08 DIAGNOSIS — S42254A Nondisplaced fracture of greater tuberosity of right humerus, initial encounter for closed fracture: Secondary | ICD-10-CM | POA: Insufficient documentation

## 2018-08-01 DIAGNOSIS — S22000A Wedge compression fracture of unspecified thoracic vertebra, initial encounter for closed fracture: Secondary | ICD-10-CM | POA: Insufficient documentation

## 2018-08-13 DIAGNOSIS — M5104 Intervertebral disc disorders with myelopathy, thoracic region: Secondary | ICD-10-CM | POA: Insufficient documentation

## 2018-10-26 MED ORDER — METHYLPREDNISOLONE SODIUM SUCC 125 MG IJ SOLR
80.00 | INTRAMUSCULAR | Status: DC
Start: 2018-10-25 — End: 2018-10-26

## 2018-10-26 MED ORDER — LABETALOL HCL 5 MG/ML IV SOLN
20.00 | INTRAVENOUS | Status: DC
Start: ? — End: 2018-10-26

## 2018-10-26 MED ORDER — TIZANIDINE HCL 4 MG PO TABS
4.00 | ORAL_TABLET | ORAL | Status: DC
Start: ? — End: 2018-10-26

## 2018-10-26 MED ORDER — GUAIFENESIN 100 MG/5ML PO LIQD
10.00 | ORAL | Status: DC
Start: ? — End: 2018-10-26

## 2018-10-26 MED ORDER — OXYCODONE HCL 10 MG PO TABS
10.00 | ORAL_TABLET | ORAL | Status: DC
Start: ? — End: 2018-10-26

## 2018-10-26 MED ORDER — IPRATROPIUM-ALBUTEROL 0.5-2.5 (3) MG/3ML IN SOLN
3.00 | RESPIRATORY_TRACT | Status: DC
Start: 2018-10-25 — End: 2018-10-26

## 2018-10-26 MED ORDER — ALPRAZOLAM 0.5 MG PO TABS
1.00 | ORAL_TABLET | ORAL | Status: DC
Start: 2018-10-25 — End: 2018-10-26

## 2018-10-26 MED ORDER — ACETAMINOPHEN 325 MG PO TABS
650.00 | ORAL_TABLET | ORAL | Status: DC
Start: ? — End: 2018-10-26

## 2018-10-26 MED ORDER — FLUOXETINE HCL 20 MG PO CAPS
60.00 | ORAL_CAPSULE | ORAL | Status: DC
Start: 2018-10-25 — End: 2018-10-26

## 2018-10-26 MED ORDER — RIVAROXABAN 20 MG PO TABS
20.00 | ORAL_TABLET | ORAL | Status: DC
Start: 2018-10-26 — End: 2018-10-26

## 2018-10-26 MED ORDER — DOCUSATE SODIUM 100 MG PO CAPS
100.00 | ORAL_CAPSULE | ORAL | Status: DC
Start: 2018-10-25 — End: 2018-10-26

## 2018-10-26 MED ORDER — BUDESONIDE-FORMOTEROL FUMARATE 160-4.5 MCG/ACT IN AERO
2.00 | INHALATION_SPRAY | RESPIRATORY_TRACT | Status: DC
Start: 2018-10-25 — End: 2018-10-26

## 2018-10-26 MED ORDER — MONTELUKAST SODIUM 10 MG PO TABS
10.00 | ORAL_TABLET | ORAL | Status: DC
Start: 2018-10-25 — End: 2018-10-26

## 2018-10-26 MED ORDER — PANTOPRAZOLE SODIUM 40 MG PO TBEC
40.00 | DELAYED_RELEASE_TABLET | ORAL | Status: DC
Start: 2018-10-26 — End: 2018-10-26

## 2018-10-26 MED ORDER — DOXYCYCLINE MONOHYDRATE 100 MG PO CAPS
100.00 | ORAL_CAPSULE | ORAL | Status: DC
Start: 2018-10-25 — End: 2018-10-26

## 2018-10-26 MED ORDER — AMLODIPINE BESYLATE 5 MG PO TABS
5.00 | ORAL_TABLET | ORAL | Status: DC
Start: 2018-10-26 — End: 2018-10-26

## 2018-10-26 MED ORDER — GENERIC EXTERNAL MEDICATION
7.50 | Status: DC
Start: 2018-10-25 — End: 2018-10-26

## 2018-10-26 MED ORDER — GENERIC EXTERNAL MEDICATION
Status: DC
Start: ? — End: 2018-10-26

## 2018-10-26 MED ORDER — SODIUM CHLORIDE 0.9 % IV SOLN
10.00 | INTRAVENOUS | Status: DC
Start: ? — End: 2018-10-26

## 2018-10-26 MED ORDER — ONDANSETRON HCL 4 MG PO TABS
4.00 | ORAL_TABLET | ORAL | Status: DC
Start: ? — End: 2018-10-26

## 2018-11-01 DIAGNOSIS — J9819 Other pulmonary collapse: Secondary | ICD-10-CM | POA: Insufficient documentation

## 2018-11-06 DIAGNOSIS — J206 Acute bronchitis due to rhinovirus: Secondary | ICD-10-CM | POA: Insufficient documentation

## 2018-11-15 ENCOUNTER — Institutional Professional Consult (permissible substitution): Payer: Medicare Other | Admitting: Pulmonary Disease

## 2018-11-26 MED ORDER — ALPRAZOLAM 0.5 MG PO TABS
1.00 | ORAL_TABLET | ORAL | Status: DC
Start: 2018-11-25 — End: 2018-11-26

## 2018-11-26 MED ORDER — HYDROXYZINE HCL 25 MG PO TABS
25.00 | ORAL_TABLET | ORAL | Status: DC
Start: ? — End: 2018-11-26

## 2018-11-27 ENCOUNTER — Institutional Professional Consult (permissible substitution): Payer: Medicare Other | Admitting: Pulmonary Disease

## 2018-12-04 ENCOUNTER — Other Ambulatory Visit: Payer: Self-pay

## 2018-12-04 ENCOUNTER — Encounter: Payer: Self-pay | Admitting: Pulmonary Disease

## 2018-12-04 ENCOUNTER — Ambulatory Visit (INDEPENDENT_AMBULATORY_CARE_PROVIDER_SITE_OTHER): Payer: Medicare Other | Admitting: Pulmonary Disease

## 2018-12-04 DIAGNOSIS — Z720 Tobacco use: Secondary | ICD-10-CM | POA: Diagnosis not present

## 2018-12-04 DIAGNOSIS — J432 Centrilobular emphysema: Secondary | ICD-10-CM

## 2018-12-04 MED ORDER — ALBUTEROL SULFATE HFA 108 (90 BASE) MCG/ACT IN AERS: INHALATION_SPRAY | RESPIRATORY_TRACT | 6 refills | Status: AC

## 2018-12-04 MED ORDER — BUDESONIDE-FORMOTEROL FUMARATE 160-4.5 MCG/ACT IN AERO: 2.0000 | INHALATION_SPRAY | Freq: Two times a day (BID) | RESPIRATORY_TRACT | 6 refills | Status: DC

## 2018-12-04 MED ORDER — ALBUTEROL SULFATE (2.5 MG/3ML) 0.083% IN NEBU: 2.5000 mg | INHALATION_SOLUTION | RESPIRATORY_TRACT | 1 refills | Status: AC | PRN

## 2018-12-04 NOTE — Assessment & Plan Note (Signed)
Refills on Symbicort, ProAir and albuterol nebs.  Spirometry pre and post on next visit in 3 months -PFTs were normal in 2016 Once again emphasized that the main treatment here would be smoking cessation Flu shot is up-to-date

## 2018-12-04 NOTE — Progress Notes (Signed)
Subjective:    Patient ID: [name], female    DOB: Jun 30, 1955, 63 y.o.   MRN: [id number]  HPI  63 yo yo quit smoking oct 2015 presents to reestablish care for COPD  She has h/o cocaine use with etoh/ hep B cirrhosis & manic depression  She was dx w/ PE in 09/2013, maintained on Xarelto since then  She was last seen by [place] in 09/2014 and referred back to direct-current ED visits. She has had 4-5 ED visits in the past 2 months for COPD exacerbation and pneumonia.  I reviewed last ED visit from [facility] ED on 11/2, chest x-ray was negative, she was given a steroid taper.  She had quit smoking in the past but relapsed, she again quit smoking about 6 months ago she waited for 2 months and has given that up to. She continues to struggle with anxiety and depression but is [facility], is now off Lamictal and Latuda and seems to be only on Xanax 1 mg 3 times daily as needed for anxiety. She also takes oxycodone for debilitating back pain.  She is maintained on a regimen of Symbicort, ProAir and albuterol nebs as needed.  She denies nocturnal wheezing.  She denies chronic cough  I also reviewed her prior imaging in our system including CT angiogram from 12/2015  She lives with her daughter and her family and there are 2 individuals who vape, there is no exposure to secondhand smoke   Significant tests/ events reviewed She was readmitted in 10/2013  with septic shock, pseudomonal bacteremia and then Nov with cavitary PNA in [facility]   CT chest without contrast 10/2018 from care everywhere-small groundglass density left lower lobe  CT angiogram 12/2015 normal mucous plugging both lower lobe CT chest 01/2014 Cystic post infectious scarring in place of consolidation noted earlier in the  apical segment right upper lobe. 11/2013  Quantiferon Gold >> negative, Sputum for AFBneg.   Echo >> EF 60 to [id number], grade 1 diastolic dysfx, mod LA dilation, PAS 32 mmHg   Past Medical  History:  Diagnosis Date  . Agoraphobia with panic attacks   . Altered mental state 10/26/2013  . Ankle fracture, left 11/27/2010   S/p ORIF 11/2   . Anxiety   . Arthritis    "knees; lower back" (10/30/2013)  . Bipolar disorder ([place])   . Cavitary pneumonia   . Cirrhosis ([place])   . CKD (chronic kidney disease), stage III   . COPD (chronic obstructive pulmonary disease) ([place])   . Depression   . DVT (deep venous thrombosis) ([place]) 09/2013   RLE  . GERD (gastroesophageal reflux disease)   . Heart murmur   . Hepatitis B   . History of blood transfusion    "due to excessive blood loss before hysterectomy"  . History of urinary tract infection   . Hypertension    currently on no medication   . Kidney stones   . Pneumonia    "4 times in the past year" (10/30/2013)  . Pulmonary emboli ([place]) 09/2013  . Shortness of breath dyspnea    increased exertion;exercise  . Urinary frequency   . Urinary incontinence       Past Surgical History:  Procedure Laterality Date  . ABDOMINAL HYSTERECTOMY    . ANKLE FRACTURE SURGERY Left   . CARPAL TUNNEL RELEASE Left   . DILATION AND CURETTAGE OF UTERUS  X 2  . FRACTURE SURGERY    . HAND SURGERY  1980's/left hand   . INSERTION OF MESH N/A 12/10/2014   Procedure: INSERTION OF MESH;  Surgeon: Michael Boston, MD;  Location: [facility];  Service: General;  Laterality: N/A;  . LAPAROSCOPIC ASSISTED VENTRAL HERNIA REPAIR N/A 12/10/2014   Procedure: LAPAROSCOPIC ASSISTED REPAIR OF INCARCERATED UMBILICAL HERNIA ;  Surgeon: Michael Boston, MD;  Location: [facility];  Service: General;  Laterality: N/A;  . TUBAL LIGATION      Allergies  Allergen Reactions  . 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 Amitriptyline Hcl Other (See Comments)     Causes her to be very "disoriented".  . Fentanyl Other (See Comments)    Sees things Pt stated had hallucinations "d/t taking high dosage, but can tolerate low dosage"   . Gabapentin Rash and Other  (See Comments)    REACTION: rash in mouth   . Hydromorphone Hcl Nausea And Vomiting  . Cymbalta [Duloxetine Hcl] Other (See Comments)    headaches  . Neomycin-Bacitracin Zn-Polymyx Rash     Social History   Socioeconomic History  . Marital status: Single    Spouse name: Not on file  . Number of children: Not on file  . Years of education: Not on file  . Highest education level: Not on file  Occupational History  . Not on file  Social Needs  . Financial resource strain: Not on file  . Food insecurity    Worry: Not on file    Inability: Not on file  . Transportation needs    Medical: Not on file    Non-medical: Not on file  Tobacco Use  . Smoking status: Current Every Day Smoker    Packs/day: 0.50    Years: 35.00    Pack years: 17.50    Types: Cigarettes    Last attempt to quit: 10/26/2013    Years since quitting: 5.1  . Smokeless tobacco: Never Used  Substance and Sexual Activity  . Alcohol use: No    Alcohol/week: 0.0 standard drinks    Comment: "stopped drinking  in 2004"  . Drug use: No    Types: "Crack" cocaine, Marijuana    Comment: 10/30/2013 "last crack in 2014; last marijuana in ~ 1990" drug free for last 3 years   . Sexual activity: Never  Lifestyle  . Physical activity    Days per week: Not on file    Minutes per session: Not on file  . Stress: Not on file  Relationships  . Social Herbalist on phone: Not on file    Gets together: Not on file    Attends religious service: Not on file    Active member of club or organization: Not on file    Attends meetings of clubs or organizations: Not on file    Relationship status: Not on file  . Intimate partner violence    Fear of current or ex partner: Not on file    Emotionally abused: Not on file    Physically abused: Not on file    Forced sexual activity: Not on file  Other Topics Concern  . Not on file  Social History Narrative  . Not on file     Family History  Problem Relation Age of  Onset  . Stroke Mother   . Lung cancer Father     09/2014 > pFTs >> nml  Review of Systems Constitutional: negative for anorexia, fevers and sweats  Eyes: negative for irritation, redness and visual  disturbance  Ears, nose, mouth, throat, and face: negative for earaches, epistaxis, nasal congestion and sore throat  Respiratory: negative for cough, , sputum and wheezing positive for dyspnea on exertion Cardiovascular: negative for chest pain,  lower extremity edema, orthopnea, palpitations and syncope  Gastrointestinal: negative for abdominal pain, constipation, diarrhea, melena, nausea and vomiting  Genitourinary:negative for dysuria, frequency and hematuria  Hematologic/lymphatic: negative for bleeding, easy bruising and lymphadenopathy  Musculoskeletal:negative for arthralgias, muscle weakness and stiff joints  Neurological: negative for coordination problems, gait problems, headaches and weakness  Endocrine: negative for diabetic symptoms including polydipsia, polyuria and weight loss     Objective:   Physical Exam  Gen. Pleasant, well-nourished, in no distress, normal affect ENT - no pallor,icterus, no post nasal drip Neck: No JVD, no thyromegaly, no carotid bruits Lungs: no use of accessory muscles, no dullness to percussion, decreased bilateral without rales or rhonchi  Cardiovascular: Rhythm regular, heart sounds  normal, no murmurs or gallops, no peripheral edema Abdomen: soft and non-tender, no hepatosplenomegaly, BS normal. Musculoskeletal: No deformities, no cyanosis or clubbing Neuro:  alert, non focal        Assessment & Plan:

## 2018-12-04 NOTE — Addendum Note (Signed)
Addended by: Nena Polio on: 12/04/2018 12:19 PM   Modules accepted: Orders

## 2018-12-04 NOTE — Assessment & Plan Note (Signed)
Smoking cessation again emphasized 

## 2018-12-04 NOTE — Patient Instructions (Signed)
Refills on Symbicort, ProAir and albuterol nebs.  Spirometry pre and post on next visit in 3 months

## 2019-01-22 DIAGNOSIS — A409 Streptococcal sepsis, unspecified: Secondary | ICD-10-CM | POA: Insufficient documentation

## 2019-01-22 DIAGNOSIS — Z8619 Personal history of other infectious and parasitic diseases: Secondary | ICD-10-CM | POA: Insufficient documentation

## 2019-02-15 DIAGNOSIS — M4802 Spinal stenosis, cervical region: Secondary | ICD-10-CM | POA: Insufficient documentation

## 2019-02-16 DIAGNOSIS — F1320 Sedative, hypnotic or anxiolytic dependence, uncomplicated: Secondary | ICD-10-CM | POA: Insufficient documentation

## 2019-03-06 ENCOUNTER — Ambulatory Visit: Payer: Medicare Other | Admitting: Adult Health

## 2019-04-06 DIAGNOSIS — J90 Pleural effusion, not elsewhere classified: Secondary | ICD-10-CM | POA: Insufficient documentation

## 2019-04-06 DIAGNOSIS — R739 Hyperglycemia, unspecified: Secondary | ICD-10-CM | POA: Insufficient documentation

## 2019-04-06 DIAGNOSIS — R0603 Acute respiratory distress: Secondary | ICD-10-CM | POA: Insufficient documentation

## 2019-04-15 ENCOUNTER — Ambulatory Visit: Payer: Medicare Other | Admitting: Pulmonary Disease

## 2019-04-19 DIAGNOSIS — Z1211 Encounter for screening for malignant neoplasm of colon: Secondary | ICD-10-CM | POA: Insufficient documentation

## 2019-04-19 DIAGNOSIS — Z1212 Encounter for screening for malignant neoplasm of rectum: Secondary | ICD-10-CM | POA: Insufficient documentation

## 2019-05-04 DIAGNOSIS — M5126 Other intervertebral disc displacement, lumbar region: Secondary | ICD-10-CM | POA: Insufficient documentation

## 2019-05-05 ENCOUNTER — Ambulatory Visit: Payer: Medicare Other | Admitting: Adult Health

## 2019-05-19 ENCOUNTER — Ambulatory Visit: Payer: Medicare Other | Admitting: Adult Health

## 2019-05-27 ENCOUNTER — Ambulatory Visit: Payer: Medicare Other | Admitting: Adult Health

## 2019-06-02 DIAGNOSIS — J690 Pneumonitis due to inhalation of food and vomit: Secondary | ICD-10-CM | POA: Insufficient documentation

## 2019-06-26 MED ORDER — RIVAROXABAN 20 MG PO TABS
20.00 | ORAL_TABLET | ORAL | Status: DC
Start: 2019-06-26 — End: 2019-06-26

## 2019-06-26 MED ORDER — MONTELUKAST SODIUM 10 MG PO TABS
10.00 | ORAL_TABLET | ORAL | Status: DC
Start: 2019-06-26 — End: 2019-06-26

## 2019-06-26 MED ORDER — CLONIDINE HCL 0.1 MG PO TABS
0.10 | ORAL_TABLET | ORAL | Status: DC
Start: ? — End: 2019-06-26

## 2019-06-26 MED ORDER — PANTOPRAZOLE SODIUM 40 MG PO TBEC
40.00 | DELAYED_RELEASE_TABLET | ORAL | Status: DC
Start: 2019-06-26 — End: 2019-06-26

## 2019-06-26 MED ORDER — DOXEPIN HCL 25 MG PO CAPS
50.00 | ORAL_CAPSULE | ORAL | Status: DC
Start: 2019-06-26 — End: 2019-06-26

## 2019-06-26 MED ORDER — SODIUM CHLORIDE 0.9 % IV SOLN
10.00 | INTRAVENOUS | Status: DC
Start: ? — End: 2019-06-26

## 2019-06-26 MED ORDER — GENERIC EXTERNAL MEDICATION
Status: DC
Start: ? — End: 2019-06-26

## 2019-06-26 MED ORDER — FAMOTIDINE 20 MG PO TABS
20.00 | ORAL_TABLET | ORAL | Status: DC
Start: 2019-06-26 — End: 2019-06-26

## 2019-06-26 MED ORDER — AMLODIPINE BESYLATE 5 MG PO TABS
5.00 | ORAL_TABLET | ORAL | Status: DC
Start: 2019-06-27 — End: 2019-06-26

## 2019-06-26 MED ORDER — IBUPROFEN 400 MG PO TABS
400.00 | ORAL_TABLET | ORAL | Status: DC
Start: ? — End: 2019-06-26

## 2019-06-26 MED ORDER — FUROSEMIDE 40 MG PO TABS
40.00 | ORAL_TABLET | ORAL | Status: DC
Start: 2019-06-27 — End: 2019-06-26

## 2019-06-26 MED ORDER — GUAIFENESIN 100 MG/5ML PO SYRP
200.00 | ORAL_SOLUTION | ORAL | Status: DC
Start: ? — End: 2019-06-26

## 2019-06-26 MED ORDER — UMECLIDINIUM-VILANTEROL 62.5-25 MCG/INH IN AEPB
1.00 | INHALATION_SPRAY | RESPIRATORY_TRACT | Status: DC
Start: 2019-06-27 — End: 2019-06-26

## 2019-06-26 MED ORDER — OXYCODONE HCL 10 MG PO TABS
10.00 | ORAL_TABLET | ORAL | Status: DC
Start: ? — End: 2019-06-26

## 2019-06-26 MED ORDER — METHYLPREDNISOLONE SODIUM SUCC 40 MG IJ SOLR
40.00 | INTRAMUSCULAR | Status: DC
Start: 2019-06-26 — End: 2019-06-26

## 2019-06-26 MED ORDER — POLYETHYLENE GLYCOL 3350 17 GM/SCOOP PO POWD
17.00 | ORAL | Status: DC
Start: ? — End: 2019-06-26

## 2019-07-31 ENCOUNTER — Ambulatory Visit: Payer: Medicare Other | Admitting: Pulmonary Disease

## 2019-08-19 ENCOUNTER — Ambulatory Visit: Payer: Medicare Other | Admitting: Pulmonary Disease

## 2019-09-18 DIAGNOSIS — L03119 Cellulitis of unspecified part of limb: Secondary | ICD-10-CM | POA: Insufficient documentation

## 2019-09-20 DIAGNOSIS — E559 Vitamin D deficiency, unspecified: Secondary | ICD-10-CM | POA: Insufficient documentation

## 2019-10-10 ENCOUNTER — Telehealth: Payer: Self-pay

## 2019-10-10 NOTE — Telephone Encounter (Signed)
Patient called she stated Dr[facility]Spivey [facility] a referral and she wants to know if referral was received and notes that include notes about the shoulder. She would like a call back:[phone].

## 2019-10-20 ENCOUNTER — Ambulatory Visit: Payer: Medicare Other | Admitting: Physician Assistant

## 2019-10-25 ENCOUNTER — Encounter (HOSPITAL_COMMUNITY): Payer: Self-pay | Admitting: Emergency Medicine

## 2019-10-25 ENCOUNTER — Other Ambulatory Visit: Payer: Self-pay

## 2019-10-25 DIAGNOSIS — N183 Chronic kidney disease, stage 3 unspecified: Secondary | ICD-10-CM | POA: Diagnosis not present

## 2019-10-25 DIAGNOSIS — Z7951 Long term (current) use of inhaled steroids: Secondary | ICD-10-CM | POA: Diagnosis not present

## 2019-10-25 DIAGNOSIS — I129 Hypertensive chronic kidney disease with stage 1 through stage 4 chronic kidney disease, or unspecified chronic kidney disease: Secondary | ICD-10-CM | POA: Diagnosis not present

## 2019-10-25 DIAGNOSIS — R2243 Localized swelling, mass and lump, lower limb, bilateral: Secondary | ICD-10-CM | POA: Diagnosis present

## 2019-10-25 DIAGNOSIS — R600 Localized edema: Secondary | ICD-10-CM | POA: Diagnosis not present

## 2019-10-25 DIAGNOSIS — F17210 Nicotine dependence, cigarettes, uncomplicated: Secondary | ICD-10-CM | POA: Insufficient documentation

## 2019-10-25 DIAGNOSIS — Z7901 Long term (current) use of anticoagulants: Secondary | ICD-10-CM | POA: Insufficient documentation

## 2019-10-25 DIAGNOSIS — J449 Chronic obstructive pulmonary disease, unspecified: Secondary | ICD-10-CM | POA: Insufficient documentation

## 2019-10-25 NOTE — ED Triage Notes (Signed)
Pt reports painful bilateral leg swelling with the R worse than the L. States that it has been ongoing for weeks. Hurts to walk. Takes blood thinners for a hx of clots.

## 2019-10-26 ENCOUNTER — Emergency Department (HOSPITAL_COMMUNITY)
Admission: EM | Admit: 2019-10-26 | Discharge: 2019-10-26 | Disposition: A | Discharge status: Home / Self Care | Payer: Medicare Other | Attending: Emergency Medicine | Admitting: Emergency Medicine

## 2019-10-26 DIAGNOSIS — R600 Localized edema: Secondary | ICD-10-CM

## 2019-10-26 LAB — COMPREHENSIVE METABOLIC PANEL WITH GFR
ALT: 36 U/L (ref 0–44)
AST: 38 U/L (ref 15–41)
Albumin: 4.4 g/dL (ref 3.5–5.0)
Alkaline Phosphatase: 110 U/L (ref 38–126)
Anion gap: 13 (ref 5–15)
BUN: 12 mg/dL (ref 8–23)
CO2: 27 mmol/L (ref 22–32)
Calcium: 10 mg/dL (ref 8.9–10.3)
Chloride: 104 mmol/L (ref 98–111)
Creatinine, Ser: 0.89 mg/dL (ref 0.44–1.00)
GFR calc Af Amer: >60 mL/min
GFR calc non Af Amer: >60 mL/min
Glucose, Bld: 96 mg/dL (ref 70–99)
Potassium: 3.5 mmol/L (ref 3.5–5.1)
Sodium: 144 mmol/L (ref 135–145)
Total Bilirubin: 0.6 mg/dL (ref 0.3–1.2)
Total Protein: 7.8 g/dL (ref 6.5–8.1)

## 2019-10-26 LAB — CBC
HCT: 38.4 % (ref 36.0–46.0)
Hemoglobin: 11.7 g/dL — ABNORMAL LOW (ref 12.0–15.0)
MCH: 25.8 pg — ABNORMAL LOW (ref 26.0–34.0)
MCHC: 30.5 g/dL (ref 30.0–36.0)
MCV: 84.8 fL (ref 80.0–100.0)
Platelets: 186 10*3/uL (ref 150–400)
RBC: 4.53 MIL/uL (ref 3.87–5.11)
RDW: 17.2 % — ABNORMAL HIGH (ref 11.5–15.5)
WBC: 6.7 10*3/uL (ref 4.0–10.5)
nRBC: 0 % (ref 0.0–0.2)

## 2019-10-26 MED ORDER — ONDANSETRON HCL 4 MG/2ML IJ SOLN
4.0000 mg | Freq: Once | INTRAMUSCULAR | Status: AC
  Administered 2019-10-26: 4 mg via INTRAVENOUS
  Filled 2019-10-26: qty 2

## 2019-10-26 MED ORDER — FUROSEMIDE 10 MG/ML IJ SOLN: 20.0000 mg | Freq: Once | INTRAMUSCULAR | Status: DC

## 2019-10-26 MED ORDER — MORPHINE SULFATE (PF) 2 MG/ML IV SOLN
2.0000 mg | Freq: Once | INTRAVENOUS | Status: AC
  Administered 2019-10-26: 2 mg via INTRAVENOUS
  Filled 2019-10-26: qty 1

## 2019-10-26 MED ORDER — POTASSIUM CHLORIDE CRYS ER 20 MEQ PO TBCR: 40.0000 meq | EXTENDED_RELEASE_TABLET | Freq: Once | ORAL | Status: DC

## 2019-10-26 NOTE — Discharge Instructions (Addendum)
You were seen in the emergency department today for lower leg swelling and discomfort.  Your labs were similar to prior blood work you have had done did not show any significant abnormalities.  Please resume taking your Lasix as prescribed.  Please keep your legs elevated whenever possible.  We recommend you try compression stockings.  Please follow-up with your primary care provider within 3 to 5 days for reevaluation.  Return to the ER for new or worsening symptoms including but not limited to increased pain, increased swelling, trouble breathing, chest pain, passing out, fever, or any other concerns.

## 2019-10-26 NOTE — ED Provider Notes (Signed)
[facility] DEPT Provider Note   CSN: [id number] Arrival date & time: 10/25/19  2301     History Chief Complaint  Patient presents with  . Leg Swelling    [name] is a 64 y.o. female with a history of prior prior PE & prior DVT of the RLE anticoagulated on xarelto, CKD, COPD, hypertension, bipolar disorder, cirrhosis, & cocaine abuse who presents to the ED with complaints of leg swelling for the past couple of months. Patient states the swelling is bilateral, R>L, waxes/wanes in severity, seems to be worse if she has been on her feet a lot. Has associated increased pain in the lower legs with the swelling. Also discusses her chronic back/leg pain some, no acute change in this.  She has seen prior providers for this problem, has had negative venous duplex.  She has been taking her Xarelto as prescribed.  She denies fever, chills, acute numbness/weakness, incontinence, abdominal pain, vomiting, shortness of breath, chest pain, orthopnea.  She sees pain management, takes oxycodone, states she needs something else for pain.  She also is prescribed as needed Lasix which she has not been taking for the last several weeks.Marland Kitchen  HPI     Past Medical History:  Diagnosis Date  . Agoraphobia with panic attacks   . Altered mental state 10/26/2013  . Ankle fracture, left 11/27/2010   S/p ORIF 11/2   . Anxiety   . Arthritis    "knees; lower back" (10/30/2013)  . Bipolar disorder ([place])   . Cavitary pneumonia   . Cirrhosis ([place])   . CKD (chronic kidney disease), stage III ([place])   . COPD (chronic obstructive pulmonary disease) ([place])   . Depression   . DVT (deep venous thrombosis) ([place]) 09/2013   RLE  . GERD (gastroesophageal reflux disease)   . Heart murmur   . Hepatitis B   . History of blood transfusion    "due to excessive blood loss before hysterectomy"  . History of urinary tract infection   . Hypertension    currently on no medication   . Kidney stones     . Pneumonia    "4 times in the past year" (10/30/2013)  . Pulmonary emboli ([place]) 09/2013  . Shortness of breath dyspnea    increased exertion;exercise  . Urinary frequency   . Urinary incontinence     Patient Active Problem List   Diagnosis Date Noted  . COPD exacerbation ([place]) 06/09/2016  . Thrombocytopenia ([place]) 05/31/2015  . UTI (lower urinary tract infection) 05/26/2015  . Right flank pain   . Incarcerated incisional hernia s/p lap repair w mesh 12/10/2014 12/10/2014  . DVT (deep venous thrombosis) ([place]) 10/31/2013  . Pulmonary embolism ([place]) 10/26/2013  . Kidney stone 10/04/2013  . Tobacco abuse 10/04/2013  . COPD (chronic obstructive pulmonary disease) ([place])   . Unspecified constipation 12/23/2012  . CKD (chronic kidney disease), stage III ([place]) 12/21/2012  . Cocaine abuse ([place]) 11/27/2010  . Obesity 09/01/2008  . Hepatitis B virus infection 08/31/2008  . Acute hepatitis C virus infection 08/31/2008  . Iron deficiency anemia 08/31/2008  . Anxiety state 08/31/2008  . Essential hypertension 08/31/2008  . Coronary atherosclerosis 08/31/2008  . Hepatic cirrhosis ([place]) 08/31/2008    Past Surgical History:  Procedure Laterality Date  . ABDOMINAL HYSTERECTOMY    . ANKLE FRACTURE SURGERY Left   . CARPAL TUNNEL RELEASE Left   . DILATION AND CURETTAGE OF UTERUS  X 2  . FRACTURE SURGERY    .  HAND SURGERY     1980's/left hand   . INSERTION OF MESH N/A 12/10/2014   Procedure: INSERTION OF MESH;  Surgeon: Michael Boston, MD;  Location: [facility];  Service: General;  Laterality: N/A;  . LAPAROSCOPIC ASSISTED VENTRAL HERNIA REPAIR N/A 12/10/2014   Procedure: LAPAROSCOPIC ASSISTED REPAIR OF INCARCERATED UMBILICAL HERNIA ;  Surgeon: Michael Boston, MD;  Location: [facility];  Service: General;  Laterality: N/A;  . TUBAL LIGATION       OB History   No obstetric history on file.     Family History  Problem Relation Age of Onset  . Stroke Mother   . Lung cancer Father     Social  History   Tobacco Use  . Smoking status: Current Every Day Smoker    Packs/day: 0.50    Years: 35.00    Pack years: 17.50    Types: Cigarettes    Last attempt to quit: 10/26/2013    Years since quitting: 6.0  . Smokeless tobacco: Never Used  Substance Use Topics  . Alcohol use: No    Alcohol/week: 0.0 standard drinks    Comment: "stopped drinking  in 2004"  . Drug use: No    Types: "Crack" cocaine, Marijuana    Comment: 10/30/2013 "last crack in 2014; last marijuana in ~ 1990" drug free for last 3 years     Home Medications Prior to Admission medications   Medication Sig Start Date End Date Taking? Authorizing Provider  albuterol (PROAIR HFA) 108 (90 Base) MCG/ACT inhaler INHALE 2 PUFFS INTO THE LUNGS EVERY 4 HOURS AS NEEDED FOR WHEEZING 12/04/18   Rigoberto Noel, MD  albuterol (PROVENTIL) (2.5 MG/3ML) 0.083% nebulizer solution Take 3 mLs (2.5 mg total) by nebulization every 4 (four) hours as needed for wheezing or shortness of breath. 12/04/18   Rigoberto Noel, MD  ALPRAZolam Duanne Moron) 1 MG tablet Take 0.5 tablets (0.5 mg total) by mouth 2 (two) times daily as needed for anxiety. Patient taking differently: Take 1 mg by mouth 3 (three) times daily as needed for anxiety.  06/02/15   Elgergawy, Silver Huguenin, MD  budesonide-formoterol (SYMBICORT) 160-4.5 MCG/ACT inhaler Inhale 2 puffs into the lungs 2 (two) times daily. 12/04/18 12/04/19  Rigoberto Noel, MD  ibuprofen (ADVIL,MOTRIN) 800 MG tablet Take 1 tablet (800 mg total) by mouth 3 (three) times daily. 11/12/15   de Villier, Daryl F II, PA  Oxycodone HCl 10 MG TABS Take by mouth. 10/19/18   [provider]  oxyCODONE-acetaminophen (PERCOCET) 7.5-325 MG tablet Take 1 tablet by mouth every 6 (six) hours as needed for severe pain. 06/23/16   Caren Griffins, MD  pantoprazole (PROTONIX) 40 MG tablet Take 40 mg by mouth daily. 12/23/13   [provider]  rivaroxaban (XARELTO) 20 MG TABS tablet Take 20 mg by mouth daily.     [provider]  enoxaparin (LOVENOX) 120 MG/0.8ML SOLN Inject 0.8 mLs (120 mg total) into the skin every 12 (twelve) hours. 11/30/10 04/16/11  Jenelle Mages, MD  loratadine (CLARITIN) 10 MG tablet Take 10 mg by mouth daily.    04/16/11  [provider]    Allergies    Heparin, Amitriptyline hcl, Fentanyl, Gabapentin, Hydromorphone hcl, Cymbalta [duloxetine hcl], and Neomycin-bacitracin zn-polymyx  Review of Systems   Review of Systems  Constitutional: Negative for chills and fever.  Respiratory: Negative for shortness of breath.        Negative for orthopnea.   Cardiovascular: Positive for leg swelling. Negative for  chest pain.  Gastrointestinal: Negative for abdominal pain and vomiting.  Genitourinary: Negative for dysuria.  Musculoskeletal: Positive for myalgias.  Neurological: Negative for weakness and numbness.       Negative for incontinence or saddle anesthesia.  All other systems reviewed and are negative.   Physical Exam Updated Vital Signs BP (!) 179/120 (BP Location: Right Arm)   Pulse 85   Temp 97.9 F (36.6 C) (Oral)   Resp 15   Ht 5\' 4"  (1.626 m)   Wt 108 kg   SpO2 100%   BMI 40.85 kg/m   Physical Exam Vitals and nursing note reviewed.  Constitutional:      General: She is not in acute distress.    Appearance: She is well-developed. She is obese. She is not toxic-appearing.  HENT:     Head: Normocephalic and atraumatic.  Eyes:     General:        Right eye: No discharge.        Left eye: No discharge.     Conjunctiva/sclera: Conjunctivae normal.  Cardiovascular:     Rate and Rhythm: Normal rate and regular rhythm.     Comments: 2+ symmetric DP/PT pulses.  Pulmonary:     Effort: Pulmonary effort is normal. No respiratory distress.     Breath sounds: Normal breath sounds. No wheezing, rhonchi or rales.  Abdominal:     General: There is no distension.     Palpations: Abdomen is soft.     Tenderness: There is no abdominal  tenderness. There is no guarding or rebound.  Musculoskeletal:     Cervical back: Neck supple.     Comments: Patient has fairly symmetric 2+ pitting edema to the bilateral lower legs, question R>L very minimally.  There is no significant erythema, warmth, or open wounds.  Intact active range of motion throughout.  She has some mild tenderness to the bilateral lower legs as well as the dorsal aspect of the feet.  No focal bony tenderness.  Compartments are soft.   Skin:    General: Skin is warm and dry.     Findings: No rash.  Neurological:     Mental Status: She is alert.     Comments: Clear speech. Sensation grossly intact to bilateral lower extremities. 5/5 symmetric strength with plantar/dorsiflexion. Ambulatory.   Psychiatric:        Behavior: Behavior normal.    ED Results / Procedures / Treatments   Labs (all labs ordered are listed, but only abnormal results are displayed) Labs Reviewed - No data to display  EKG None  Radiology No results found.  Procedures Procedures (including critical care time)  Medications Ordered in ED Medications - No data to display  ED Course  I have reviewed the triage vital signs and the nursing notes.  Pertinent labs & imaging results that were available during my care of the patient were reviewed by me and considered in my medical decision making (see chart for details).    MDM Rules/Calculators/A&P                          Patient presents to the ED with complaints of bilateral LE swelling (R>L) x 2 months.. Nontoxic, vitals w/ elevated BP, otherwise unremarkable.  On exam 2+ pitting edema present which appears fairly symmetric- appearing question R mildly worse than L. Additional history obtained:  Additional history obtained from chart review & nursing note review:  09/19/19: Echocardiogram: EF 60-65% 10/07/19: Venous  duplex of the RLE: Negative for DVT 09/07/19: MRI L spine wo contrast: Degenerative changes of the lumbar spine with  severe spinal canal stenosis at L3-L4, minimally worsened from the prior study  Lab Tests:  I Ordered, reviewed, and interpreted labs, which included:  CBC: Anemia improved from prior CMP: Unremarkable- no acute renal or hepatic failure.   Exam not consistent w/ cellulitis. No recent trauma or focal bony tenderness doubt fx/dislocation. Recent RLE venous duplex (limb patient identified as worse) that was negative for DVT, she has been compliant with her xarelto therefore feel this would be unlikely. Recent echo w/ preserved EF, no dyspnea/orthopnea therefore feel significant heart failure is less likely.  Chronic back pain, no acute neurologic deficits, recent MRI per above, do not suspect cord compression or cauda equina syndrome.  Reassuring labs today- at baseline. Given 1 time dose of IV pain medication, takes oxycodone at home for chronic pain. Recommended patient start taking her Lasix again, utilize compression stockings & elevation, and follow up with PCP. I discussed results, treatment plan, need for follow-up, and return precautions with the patient. Provided opportunity for questions, patient confirmed understanding and is in agreement with plan.   Findings and plan of care discussed with supervising physician Dr. Stark Jock who is in agreement.   Portions of this note were generated with Lobbyist. Dictation errors may occur despite best attempts at proofreading.  Final Clinical Impression(s) / ED Diagnoses Final diagnoses:  Lower extremity edema    Rx / DC Orders ED Discharge Orders    None       Amaryllis Dyke, PA-C 10/26/19 0606    Veryl Speak, MD 10/26/19 0630

## 2019-10-31 ENCOUNTER — Encounter (HOSPITAL_COMMUNITY): Payer: Self-pay

## 2019-10-31 ENCOUNTER — Emergency Department (HOSPITAL_COMMUNITY): Payer: Medicare Other

## 2019-10-31 ENCOUNTER — Emergency Department (HOSPITAL_COMMUNITY)
Admission: EM | Admit: 2019-10-31 | Discharge: 2019-11-01 | Disposition: A | Discharge status: Home / Self Care | Payer: Medicare Other | Attending: Emergency Medicine | Admitting: Emergency Medicine

## 2019-10-31 DIAGNOSIS — I129 Hypertensive chronic kidney disease with stage 1 through stage 4 chronic kidney disease, or unspecified chronic kidney disease: Secondary | ICD-10-CM | POA: Diagnosis not present

## 2019-10-31 DIAGNOSIS — Z79899 Other long term (current) drug therapy: Secondary | ICD-10-CM | POA: Diagnosis not present

## 2019-10-31 DIAGNOSIS — R600 Localized edema: Secondary | ICD-10-CM

## 2019-10-31 DIAGNOSIS — J441 Chronic obstructive pulmonary disease with (acute) exacerbation: Secondary | ICD-10-CM | POA: Insufficient documentation

## 2019-10-31 DIAGNOSIS — R2243 Localized swelling, mass and lump, lower limb, bilateral: Secondary | ICD-10-CM | POA: Diagnosis present

## 2019-10-31 DIAGNOSIS — Z7951 Long term (current) use of inhaled steroids: Secondary | ICD-10-CM | POA: Insufficient documentation

## 2019-10-31 DIAGNOSIS — N183 Chronic kidney disease, stage 3 unspecified: Secondary | ICD-10-CM | POA: Diagnosis not present

## 2019-10-31 DIAGNOSIS — Z7901 Long term (current) use of anticoagulants: Secondary | ICD-10-CM | POA: Insufficient documentation

## 2019-10-31 DIAGNOSIS — R609 Edema, unspecified: Secondary | ICD-10-CM

## 2019-10-31 DIAGNOSIS — F17210 Nicotine dependence, cigarettes, uncomplicated: Secondary | ICD-10-CM | POA: Diagnosis not present

## 2019-10-31 LAB — CBC
HCT: 36.8 % (ref 36.0–46.0)
Hemoglobin: 10.8 g/dL — ABNORMAL LOW (ref 12.0–15.0)
MCH: 25.5 pg — ABNORMAL LOW (ref 26.0–34.0)
MCHC: 29.3 g/dL — ABNORMAL LOW (ref 30.0–36.0)
MCV: 86.8 fL (ref 80.0–100.0)
Platelets: 168 10*3/uL (ref 150–400)
RBC: 4.24 MIL/uL (ref 3.87–5.11)
RDW: 17.2 % — ABNORMAL HIGH (ref 11.5–15.5)
WBC: 5.9 10*3/uL (ref 4.0–10.5)
nRBC: 0 % (ref 0.0–0.2)

## 2019-10-31 LAB — BASIC METABOLIC PANEL
Anion gap: 11 (ref 5–15)
BUN: 17 mg/dL (ref 8–23)
CO2: 26 mmol/L (ref 22–32)
Calcium: 9.7 mg/dL (ref 8.9–10.3)
Chloride: 103 mmol/L (ref 98–111)
Creatinine, Ser: 1.38 mg/dL — ABNORMAL HIGH (ref 0.44–1.00)
GFR, Estimated: 41 mL/min — ABNORMAL LOW (ref >60)
Glucose, Bld: 151 mg/dL — ABNORMAL HIGH (ref 70–99)
Potassium: 3.7 mmol/L (ref 3.5–5.1)
Sodium: 140 mmol/L (ref 135–145)

## 2019-10-31 LAB — URINALYSIS, ROUTINE W REFLEX MICROSCOPIC
Bacteria, UA: NONE SEEN
Bilirubin Urine: NEGATIVE
Glucose, UA: NEGATIVE mg/dL
Ketones, ur: NEGATIVE mg/dL
Leukocytes,Ua: NEGATIVE
Nitrite: NEGATIVE
Protein, ur: 30 mg/dL — AB
Specific Gravity, Urine: 1.014 (ref 1.005–1.030)
pH: 6 (ref 5.0–8.0)

## 2019-10-31 NOTE — ED Triage Notes (Addendum)
Pt arrives to ED w/ c/o BLE edema. Pt states she was started on lasix but it has not helped. Pt also reports back pain and painful urination. Pt reports hx of blood clots, currently on xeralto.

## 2019-11-01 DIAGNOSIS — R600 Localized edema: Secondary | ICD-10-CM | POA: Diagnosis not present

## 2019-11-01 MED ORDER — FUROSEMIDE 20 MG PO TABS
40.0000 mg | ORAL_TABLET | Freq: Once | ORAL | Status: AC
  Administered 2019-11-01: 40 mg via ORAL
  Filled 2019-11-01: qty 2

## 2019-11-01 MED ORDER — FUROSEMIDE 20 MG PO TABS: ORAL_TABLET | ORAL | 0 refills | Status: DC

## 2019-11-01 MED ORDER — ONDANSETRON 4 MG PO TBDP
4.0000 mg | ORAL_TABLET | Freq: Once | ORAL | Status: AC
  Administered 2019-11-01: 4 mg via ORAL
  Filled 2019-11-01: qty 1

## 2019-11-01 MED ORDER — HYDROMORPHONE HCL 1 MG/ML IJ SOLN
1.0000 mg | Freq: Once | INTRAMUSCULAR | Status: AC
  Administered 2019-11-01: 1 mg via INTRAMUSCULAR
  Filled 2019-11-01: qty 1

## 2019-11-01 NOTE — Discharge Instructions (Addendum)
Take Lasix as directed to help resolve the swelling in your legs.   Please keep your scheduled appointment with your doctor for follow up.

## 2019-11-01 NOTE — ED Provider Notes (Signed)
[facility] EMERGENCY DEPARTMENT Provider Note   CSN: [id number] Arrival date & time: 10/31/19  1749     History Chief Complaint  Patient presents with  . Leg Swelling    [name] is a 64 y.o. female.  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 She reports back pain which is chronic/unchanged. No chest pain, SoB, congestion, nausea. She reports being prescribed Lasix previously. She was seen at [facility] ED for similar complaints on 10/26/19 and discharged home with the recommendation to continue her Lasix, wear compression hose and follow up with PCP for recheck. She states her dose of Lasix was 10 mg and she is not out. She did not get compression stockings. She states her PCP has no availability until after the beginning of November.   The history is provided by the patient. No language interpreter was used.       Past Medical History:  Diagnosis Date  . Agoraphobia with panic attacks   . Altered mental state 10/26/2013  . Ankle fracture, left 11/27/2010   S/p ORIF 11/2   . Anxiety   . Arthritis    "knees; lower back" (10/30/2013)  . Bipolar disorder ([place])   . Cavitary pneumonia   . Cirrhosis ([place])   . CKD (chronic kidney disease), stage III ([place])   . COPD (chronic obstructive pulmonary disease) ([place])   . Depression   . DVT (deep venous thrombosis) ([place]) 09/2013   RLE  . GERD (gastroesophageal reflux disease)   . Heart murmur   . Hepatitis B   . History of blood transfusion    "due to excessive blood loss before hysterectomy"  . History of urinary tract infection   . Hypertension    currently on no medication   . Kidney stones   . Pneumonia    "4 times in the past year" (10/30/2013)  . Pulmonary emboli ([place]) 09/2013  . Shortness of breath dyspnea    increased exertion;exercise  . Urinary frequency   . Urinary  incontinence     Patient Active Problem List   Diagnosis Date Noted  . COPD exacerbation ([place]) 06/09/2016  . Thrombocytopenia ([place]) 05/31/2015  . UTI (lower urinary tract infection) 05/26/2015  . Right flank pain   . Incarcerated incisional hernia s/p lap repair w mesh 12/10/2014 12/10/2014  . DVT (deep venous thrombosis) ([place]) 10/31/2013  . Pulmonary embolism ([place]) 10/26/2013  . Kidney stone 10/04/2013  . Tobacco abuse 10/04/2013  . COPD (chronic obstructive pulmonary disease) ([place])   . Unspecified constipation 12/23/2012  . CKD (chronic kidney disease), stage III ([place]) 12/21/2012  . Cocaine abuse ([place]) 11/27/2010  . Obesity 09/01/2008  . Hepatitis B virus infection 08/31/2008  . Acute hepatitis C virus infection 08/31/2008  . Iron deficiency anemia 08/31/2008  . Anxiety state 08/31/2008  . Essential hypertension 08/31/2008  . Coronary atherosclerosis 08/31/2008  . Hepatic cirrhosis ([place]) 08/31/2008    Past Surgical History:  Procedure Laterality Date  . ABDOMINAL HYSTERECTOMY    . ANKLE FRACTURE SURGERY Left   . CARPAL TUNNEL RELEASE Left   . DILATION AND CURETTAGE OF UTERUS  X 2  . FRACTURE SURGERY    . HAND SURGERY     1980's/left hand   . INSERTION OF MESH N/A 12/10/2014   Procedure: INSERTION OF MESH;  Surgeon: Michael Boston, MD;  Location: [facility] ORS;  Service: General;  Laterality: N/A;  . LAPAROSCOPIC ASSISTED VENTRAL HERNIA REPAIR N/A 12/10/2014   Procedure: LAPAROSCOPIC ASSISTED REPAIR OF INCARCERATED UMBILICAL HERNIA ;  Surgeon: Michael Boston, MD;  Location: [facility] ORS;  Service: General;  Laterality: N/A;  . TUBAL LIGATION       OB History   No obstetric history on file.     Family History  Problem Relation Age of Onset  . Stroke Mother   . Lung cancer Father     Social History   Tobacco Use  . Smoking status: Current Every Day Smoker    Packs/day: 0.50    Years: 35.00    Pack years: 17.50    Types: Cigarettes    Last attempt to quit: 10/26/2013     Years since quitting: 6.0  . Smokeless tobacco: Never Used  Substance Use Topics  . Alcohol use: No    Alcohol/week: 0.0 standard drinks    Comment: "stopped drinking  in 2004"  . Drug use: No    Types: "Crack" cocaine, Marijuana    Comment: 10/30/2013 "last crack in 2014; last marijuana in ~ 1990" drug free for last 3 years     Home Medications Prior to Admission medications   Medication Sig Start Date End Date Taking? Authorizing Provider  albuterol (PROAIR HFA) 108 (90 Base) MCG/ACT inhaler INHALE 2 PUFFS INTO THE LUNGS EVERY 4 HOURS AS NEEDED FOR WHEEZING 12/04/18   Rigoberto Noel, MD  albuterol (PROVENTIL) (2.5 MG/3ML) 0.083% nebulizer solution Take 3 mLs (2.5 mg total) by nebulization every 4 (four) hours as needed for wheezing or shortness of breath. 12/04/18   Rigoberto Noel, MD  ALPRAZolam Duanne Moron) 1 MG tablet Take 0.5 tablets (0.5 mg total) by mouth 2 (two) times daily as needed for anxiety. Patient taking differently: Take 1 mg by mouth 3 (three) times daily as needed for anxiety.  06/02/15   Elgergawy, Silver Huguenin, MD  budesonide-formoterol (SYMBICORT) 160-4.5 MCG/ACT inhaler Inhale 2 puffs into the lungs 2 (two) times daily. 12/04/18 12/04/19  Rigoberto Noel, MD  ibuprofen (ADVIL,MOTRIN) 800 MG tablet Take 1 tablet (800 mg total) by mouth 3 (three) times daily. 11/12/15   de Villier, Daryl F II, PA  Oxycodone HCl 10 MG TABS Take by mouth. 10/19/18   [provider]  oxyCODONE-acetaminophen (PERCOCET) 7.5-325 MG tablet Take 1 tablet by mouth every 6 (six) hours as needed for severe pain. 06/23/16   Caren Griffins, MD  pantoprazole (PROTONIX) 40 MG tablet Take 40 mg by mouth daily. 12/23/13   [provider]  rivaroxaban (XARELTO) 20 MG TABS tablet Take 20 mg by mouth daily.    [provider]  enoxaparin (LOVENOX) 120 MG/0.8ML SOLN Inject 0.8 mLs (120 mg total) into the skin every 12 (twelve) hours. 11/30/10 04/16/11  Jenelle Mages, MD  loratadine  (CLARITIN) 10 MG tablet Take 10 mg by mouth daily.    04/16/11  [provider]    Allergies    Heparin, Amitriptyline hcl, Fentanyl, Gabapentin, Hydromorphone hcl, Cymbalta [duloxetine hcl], and Neomycin-bacitracin zn-polymyx  Review of Systems   Review of Systems  Constitutional: Negative for chills and fever.  HENT: Negative.   Respiratory: Negative.  Negative for shortness of breath.   Cardiovascular: Positive for leg swelling. Negative for chest pain.  Gastrointestinal: Negative.  Negative for abdominal pain and vomiting.  Genitourinary: Negative.  Negative for difficulty urinating and dysuria.  Musculoskeletal: Positive for back pain.       Bilateral LE  pain  Skin: Negative.  Negative for color change and wound.  Neurological: Negative.  Negative for weakness and light-headedness.  Hematological: Bruises/bleeds easily (on Xarelto).  Psychiatric/Behavioral: Negative for confusion.    Physical Exam Updated Vital Signs BP (!) 147/99   Pulse 75   Temp 98.4 F (36.9 C) (Oral)   Resp 18   SpO2 100%   Physical Exam Vitals and nursing note reviewed.  Constitutional:      Appearance: She is well-developed.  HENT:     Head: Normocephalic.  Cardiovascular:     Rate and Rhythm: Normal rate and regular rhythm.     Heart sounds: No murmur heard.   Pulmonary:     Effort: Pulmonary effort is normal.     Breath sounds: Normal breath sounds. No wheezing, rhonchi or rales.  Chest:     Chest wall: No tenderness.  Abdominal:     General: Bowel sounds are normal.     Palpations: Abdomen is soft.     Tenderness: There is no abdominal tenderness. There is no guarding or rebound.  Musculoskeletal:        General: Normal range of motion.     Cervical back: Normal range of motion and neck supple.     Right lower leg: Edema present.     Left lower leg: Edema present.     Comments: R>L LE edema of 1+. No evidence cellulitis. No discoloration. Pulses present and symmetric in  bilateral LE's.   Skin:    General: Skin is warm and dry.     Findings: No rash.  Neurological:     Mental Status: She is alert and oriented to person, place, and time.     ED Results / Procedures / Treatments   Labs (all labs ordered are listed, but only abnormal results are displayed) Labs Reviewed  CBC - Abnormal; Notable for the following components:      Result Value   Hemoglobin 10.8 (*)    MCH 25.5 (*)    MCHC 29.3 (*)    RDW 17.2 (*)    All other components within normal limits  BASIC METABOLIC PANEL - Abnormal; Notable for the following components:   Glucose, Bld 151 (*)    Creatinine, Ser 1.38 (*)    GFR, Estimated 41 (*)    All other components within normal limits  URINALYSIS, ROUTINE W REFLEX MICROSCOPIC - Abnormal; Notable for the following components:   Hgb urine dipstick SMALL (*)    Protein, ur 30 (*)    All other components within normal limits    EKG EKG Interpretation  Date/Time:  Saturday November 01 2019 01:51:53 EDT Ventricular Rate:  71 PR Interval:    QRS Duration: 103 QT Interval:  401 QTC Calculation: 436 R Axis:   -20 Text Interpretation: Sinus rhythm Borderline left axis deviation Minimal ST elevation, anterior leads No significant change since last tracing Confirmed by Ripley Fraise [phone]) on 11/01/2019 2:57:48 AM   Radiology DG Chest Portable 1 View  Result Date: 11/01/2019 CLINICAL DATA:  Bilateral lower extremity edema. EXAM: PORTABLE CHEST 1 VIEW COMPARISON:  10/27/2018 FINDINGS: The heart size and mediastinal contours are within normal limits. Both lungs are clear. The visualized skeletal structures are unremarkable. IMPRESSION: No active disease. Electronically Signed   By: Constance Holster M.D.   On: 11/01/2019 00:49    Procedures Procedures (including critical care time)  Medications Ordered in ED Medications  ondansetron (ZOFRAN-ODT) disintegrating tablet 4 mg (4 mg Oral Given 11/01/19 0159)  HYDROmorphone (DILAUDID)  injection 1 mg (1 mg Intramuscular Given 11/01/19 0159)  furosemide (LASIX) tablet 40 mg (40 mg Oral Given 11/01/19 0159)    ED Course  I have reviewed the triage vital signs and the nursing notes.  Pertinent labs & imaging results that were available during my care of the patient were reviewed by me and considered in my medical decision making (see chart for details).    MDM Rules/Calculators/A&P                          Patient to ED for evaluation of continuous/worsening swelling and pain in bilateral LE's. No CP, SOB. Previously on low dose Lasix but is out of this medication.   She is overall well appearing and in NAD. VSS. Mild LE edema. CXR without edema. There are no new EKG findings.   IM Dilaudid provided with improvement/relief of pain. Feel the patient can be discharged home. Will restart Lasix at higher does than reported: 20 mg BID x 3 days, then once daily after this. Encouraged PCP follow up for further management.   Final Clinical Impression(s) / ED Diagnoses Final diagnoses:  None   1. Peripheral edema  Rx / DC Orders ED Discharge Orders    None       Charlann Lange, PA-C 11/02/19 0617    Ripley Fraise, MD 11/02/19 [phone]

## 2019-11-03 ENCOUNTER — Ambulatory Visit (INDEPENDENT_AMBULATORY_CARE_PROVIDER_SITE_OTHER): Payer: Medicare Other | Admitting: Physician Assistant

## 2019-11-03 ENCOUNTER — Encounter: Payer: Self-pay | Admitting: Physician Assistant

## 2019-11-03 ENCOUNTER — Telehealth: Payer: Self-pay | Admitting: Physician Assistant

## 2019-11-03 DIAGNOSIS — M75122 Complete rotator cuff tear or rupture of left shoulder, not specified as traumatic: Secondary | ICD-10-CM

## 2019-11-03 NOTE — Telephone Encounter (Signed)
Spoke with patient, she is aware we need to have her back in office for full knee evaluation with xrays before we can submit for the gel injection.  I have patient scheduled for next Thursday 11/13/19 at 1:15pm

## 2019-11-03 NOTE — Progress Notes (Signed)
Office Visit Note   Patient: [name]           Date of Birth: 16-Apr-1955           MRN: [id number] Visit Date: 11/03/2019              Requested by: No referring provider defined for this encounter. PCP: Bernerd Limbo, MD   Assessment & Plan: Visit Diagnoses:  1. Complete tear of left rotator cuff, unspecified whether traumatic     Plan: After reviewing the MRI of her left shoulder with the patient and discussing the findings at length would recommend surgical intervention.  Surgery would be a left shoulder arthroscopy with extensive debridement and possible rotator cuff repair.  Discussed with her at length that the rotator cuff may not be repairable due to the muscle atrophy seen on the MRI and given the fact that shoulder pain has been ongoing for at least 6 months may be even longer.  She has failed multiple injections in her shoulder.  Postoperative protocol discussed with patient for both shoulder arthroscopy with extensive debridement versus shoulder arthroscopy with extensive debridement with rotator cuff repair.  See her back 1 week postop.  She will need to stop her Xarelto at least 4 days prior to surgery. She also ask about undergoing supplemental injections in both knees.  She states that these have helped in the past she last had a supplemental injection on left knee in March 2019 right knee June 2019.  Would need to see her for a another appointment and at that time x-ray both knees and then gain approval for the injections.  Follow-Up Instructions: Return 1 week post op.   Orders:  No orders of the defined types were placed in this encounter.  No orders of the defined types were placed in this encounter.     Procedures: No procedures performed   Clinical Data: No additional findings.   Subjective: Chief Complaint  Patient presents with  . Left Shoulder - Pain    HPI [name] is a 64 year old female were seen for the first time for left shoulder  pain is been ongoing for at least 6 months no known injury.  She is furred by her pain management physician Dr. Wylene Simmer V due to the fact that injections of the shoulder are no longer helping.  She brings with her an MRI on CD.  MRI images are reviewed of the left shoulder dated 10/07/2019 and show a full-thickness high-grade tear involving the distal supraspinatus tendon with muscle belly atrophy.  Severe arthritic changes involving the [facility] joint with a downsloping acromion.  Small glenohumeral effusion with mild superior subluxation of the humeral head. Past medical history pertinent for COPD, history of DVT/PE on chronic anticoagulation, hypertension and chronic back pain treated in pain clinic.  Review of Systems Negative for fevers chills shortness of breath chest pain.  Please see HPI otherwise negative.  Objective: Vital Signs: There were no vitals taken for this visit.  Physical Exam Constitutional:      Appearance: She is not ill-appearing or diaphoretic.  Pulmonary:     Effort: Pulmonary effort is normal.  Neurological:     Mental Status: She is alert and oriented to person, place, and time.  Psychiatric:        Mood and Affect: Mood normal.     Ortho Exam Empty can test is negative bilaterally.  5 out of 5 strength with external and internal rotation against resistance.  Positive  the liftoff test on the left negative on the right.  Impingement testing positive on the left negative on the right.  Actively she can forward flex left arm to approximately 140 degrees passively I can bring her to full overhead flexion on the left.  Specialty Comments:  No specialty comments available.  Imaging: No results found.   PMFS History: Patient Active Problem List   Diagnosis Date Noted  . Vitamin D deficiency 09/20/2019  . Cellulitis of foot 09/18/2019  . Aspiration pneumonia of both lower lobes due to gastric secretions ([place]) 06/02/2019  . Lumbar disc herniation 05/04/2019  . Encounter  for colorectal cancer screening 04/19/2019  . Acute respiratory distress 04/06/2019  . Hyperglycemia 04/06/2019  . Pleural effusion on right 04/06/2019  . Benzodiazepine dependence ([place]) 02/16/2019  . Cervical stenosis of spinal canal 02/15/2019  . Streptococcal sepsis ([place]) 01/22/2019  . Acute bronchitis due to Rhinovirus 11/06/2018  . Lung collapse 11/01/2018  . 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 Compression fracture of body of thoracic vertebra ([place]) 08/01/2018  . Closed nondisplaced fracture of greater tuberosity of right humerus 07/08/2018  . On rivaroxaban therapy 07/08/2018  . Chronic anticoagulation 05/27/2018  . Falls frequently 05/27/2018  . History of DVT (deep vein thrombosis) 03/16/2018  . History of pulmonary embolism 03/10/2018  . Left lower quadrant abdominal pain 03/10/2018  . Chronic pain syndrome 03/05/2018  . COPD exacerbation ([place]) 06/09/2016  . Abnormal urine odor 09/26/2015  . Recurrent pulmonary embolism ([place]) 06/26/2015  . Lupus anticoagulant positive 06/26/2015  . Thrombocytopenia ([place]) 05/31/2015  . UTI (lower urinary tract infection) 05/26/2015  . Right flank pain   . Patchy loss of hair 03/08/2015  . Primary osteoarthritis of both knees 02/25/2015  . Incarcerated incisional hernia s/p lap repair w mesh 12/10/2014 12/10/2014  . DVT (deep venous thrombosis) ([place]) 10/31/2013  . Pulmonary embolism ([place]) 10/26/2013  . Kidney stone 10/04/2013  . Tobacco abuse 10/04/2013  . COPD (chronic obstructive pulmonary disease) ([place])   . Unspecified constipation 12/23/2012  . CKD (chronic kidney disease), stage III ([place]) 12/21/2012  . Allergic rhinitis 06/29/2012  . Chronic low back pain 06/29/2012  . Dysuria 06/29/2012  . Edema 06/29/2012  . Gastro-esophageal reflux disease without esophagitis 06/29/2012  . Hemorrhoids 06/29/2012  . Hirsutism 06/29/2012  . Insomnia 06/29/2012  . Localized superficial swelling,  mass, or lump 06/29/2012  . Urinary incontinence 06/29/2012  . Skin sensation disturbance 06/29/2012  . Shortness of breath 06/29/2012  . Pulmonary embolism and infarction ([place]) 06/29/2012  . Proteinuria 06/29/2012  . Nondependent cannabis abuse 06/29/2012  . Acquired cyst of kidney 10/24/2011  . 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 Cocaine abuse ([place]) 11/27/2010  . Depressive disorder 09/23/2009  . Ascites 09/23/2009  . Obesity 09/01/2008  . Hepatitis B virus infection 08/31/2008  . Acute hepatitis C virus infection 08/31/2008  . Iron deficiency anemia 08/31/2008  . Anxiety state 08/31/2008  . Essential hypertension 08/31/2008  . Coronary atherosclerosis 08/31/2008  . Hepatic cirrhosis ([place]) 08/31/2008   Past Medical History:  Diagnosis Date  . Agoraphobia with panic attacks   . Altered mental state 10/26/2013  . Ankle fracture, left 11/27/2010   S/p ORIF 11/2   . Anxiety   . Arthritis    "knees; lower back" (10/30/2013)  . Bipolar disorder ([place])   . Cavitary pneumonia   . Cirrhosis ([place])   . CKD (chronic kidney disease), stage III ([place])   . COPD (chronic obstructive  pulmonary disease) ([place])   . Depression   . DVT (deep venous thrombosis) ([place]) 09/2013   RLE  . GERD (gastroesophageal reflux disease)   . Heart murmur   . Hepatitis B   . History of blood transfusion    "due to excessive blood loss before hysterectomy"  . History of urinary tract infection   . Hypertension    currently on no medication   . Kidney stones   . Pneumonia    "4 times in the past year" (10/30/2013)  . Pulmonary emboli ([place]) 09/2013  . Shortness of breath dyspnea    increased exertion;exercise  . Urinary frequency   . Urinary incontinence     Family History  Problem Relation Age of Onset  . Stroke Mother   . Lung cancer Father     Past Surgical History:  Procedure Laterality Date  . ABDOMINAL HYSTERECTOMY    . ANKLE FRACTURE SURGERY Left   . CARPAL TUNNEL RELEASE Left   .  DILATION AND CURETTAGE OF UTERUS  X 2  . FRACTURE SURGERY    . HAND SURGERY     1980's/left hand   . INSERTION OF MESH N/A 12/10/2014   Procedure: INSERTION OF MESH;  Surgeon: Michael Boston, MD;  Location: [facility];  Service: General;  Laterality: N/A;  . LAPAROSCOPIC ASSISTED VENTRAL HERNIA REPAIR N/A 12/10/2014   Procedure: LAPAROSCOPIC ASSISTED REPAIR OF INCARCERATED UMBILICAL HERNIA ;  Surgeon: Michael Boston, MD;  Location: [facility];  Service: General;  Laterality: N/A;  . TUBAL LIGATION     Social History   Occupational History  . Not on file  Tobacco Use  . Smoking status: Current Every Day Smoker    Packs/day: 0.50    Years: 35.00    Pack years: 17.50    Types: Cigarettes    Last attempt to quit: 10/26/2013    Years since quitting: 6.0  . Smokeless tobacco: Never Used  Substance and Sexual Activity  . Alcohol use: No    Alcohol/week: 0.0 standard drinks    Comment: "stopped drinking  in 2004"  . Drug use: No    Types: "Crack" cocaine, Marijuana    Comment: 10/30/2013 "last crack in 2014; last marijuana in ~ 1990" drug free for last 3 years   . Sexual activity: Never

## 2019-11-03 NOTE — Telephone Encounter (Signed)
Patient called advised she would like to get the Orthovisc injecting in both knees. Patient said the dates she had the injections before is- Left knee was injected 04/19/2017  Rt knee was injected 07/12/2017. Patient sad she would like to get an appointment as soon as possible. The number to contact patient is [phone]

## 2019-11-13 ENCOUNTER — Ambulatory Visit: Payer: Medicaid Other | Admitting: Physician Assistant

## 2019-11-19 ENCOUNTER — Ambulatory Visit: Payer: Medicaid Other | Admitting: Physician Assistant

## 2019-11-26 ENCOUNTER — Telehealth: Payer: Self-pay

## 2019-11-26 NOTE — Telephone Encounter (Signed)
lvm for pt to call back to r/s appointment for tomorrow with GIL

## 2019-11-27 ENCOUNTER — Ambulatory Visit: Payer: Medicaid Other | Admitting: Physician Assistant

## 2019-12-04 ENCOUNTER — Ambulatory Visit (INDEPENDENT_AMBULATORY_CARE_PROVIDER_SITE_OTHER): Payer: Medicare Other

## 2019-12-04 ENCOUNTER — Ambulatory Visit (INDEPENDENT_AMBULATORY_CARE_PROVIDER_SITE_OTHER): Payer: Medicare Other | Admitting: Physician Assistant

## 2019-12-04 ENCOUNTER — Ambulatory Visit: Payer: Self-pay

## 2019-12-04 ENCOUNTER — Encounter: Payer: Self-pay | Admitting: Physician Assistant

## 2019-12-04 VITALS — Ht 64.0 in | Wt 242.4 lb

## 2019-12-04 DIAGNOSIS — M25561 Pain in right knee: Secondary | ICD-10-CM | POA: Diagnosis not present

## 2019-12-04 DIAGNOSIS — G8929 Other chronic pain: Secondary | ICD-10-CM | POA: Diagnosis not present

## 2019-12-04 DIAGNOSIS — M1712 Unilateral primary osteoarthritis, left knee: Secondary | ICD-10-CM | POA: Diagnosis not present

## 2019-12-04 DIAGNOSIS — M25562 Pain in left knee: Secondary | ICD-10-CM

## 2019-12-04 DIAGNOSIS — M1711 Unilateral primary osteoarthritis, right knee: Secondary | ICD-10-CM | POA: Insufficient documentation

## 2019-12-04 MED ORDER — LIDOCAINE HCL 1 % IJ SOLN
0.5000 mL | INTRAMUSCULAR | Status: AC | PRN
  Administered 2019-12-04: .5 mL

## 2019-12-04 MED ORDER — METHYLPREDNISOLONE ACETATE 40 MG/ML IJ SUSP
40.0000 mg | INTRAMUSCULAR | Status: AC | PRN
  Administered 2019-12-04: 40 mg via INTRA_ARTICULAR

## 2019-12-04 NOTE — Progress Notes (Signed)
Office Visit Note   Patient: [name]           Date of Birth: 28-Jun-1955           MRN: [id number] Visit Date: 12/04/2019              Requested by: Bernerd Limbo, MD [place],   [id number] PCP: Bernerd Limbo, MD   Assessment & Plan: Visit Diagnoses:  1. Primary osteoarthritis of left knee   2. Primary osteoarthritis of right knee     Plan: Would recommend exhausting conservative treatment before discussing any knee replacements at this point in time.  She will follow up with [place] for supplemental injections in the near future once these are approved.  In interim she will work on Forensic scientist.  She will also work on gastrocsoleus stretching exercises as shown by Dr. Sharol Given.  She is given a prescription for compression hose.  Discussed with her shoewear and she needs a stiffer bottom shoe.  Questions were encouraged and answered at length today.  Follow-Up Instructions: Return for Supplemental injection.   Orders:  Orders Placed This Encounter  Procedures  . Large Joint Inj: bilateral knee  . XR Knee 1-2 Views Right  . XR Knee 1-2 Views Left   No orders of the defined types were placed in this encounter.     Procedures: Large Joint Inj: bilateral knee on 12/04/2019 6:03 PM Indications: pain Details: 22 G 1.5 in needle, anterolateral approach  Arthrogram: No  Medications (Right): 0.5 mL lidocaine 1 %; 40 mg methylPREDNISolone acetate 40 MG/ML Medications (Left): 0.5 mL lidocaine 1 %; 40 mg methylPREDNISolone acetate 40 MG/ML Outcome: tolerated well, no immediate complications Procedure, treatment alternatives, risks and benefits explained, specific risks discussed. Consent was given by the patient. Immediately prior to procedure a time out was called to verify the correct patient, procedure, equipment, support staff and site/side marked as required. Patient was prepped and draped in the usual sterile fashion.       Clinical  Data: No additional findings.   Subjective: Chief Complaint  Patient presents with  . Right Knee - Pain  . Left Knee - Pain    HPI [name] comes in today with bilateral knee pain requesting cortisone injections both knees.  She also is asking about having Orthovisc and supplemental injections in both knees as these have helped in the past.  Also would like to discuss knee replacement.  She has had no new injury to either knee.  She has pain in both knees.  She also states that she has a wound on the bottom of her right foot that will not heal.  She has tried multiple over-the-counter medications.  She denies any fevers or chills.  No known injury to the right foot.  She also notes that she has had cellulitis in the lower legs and has had swelling in both legs and reports a Doppler done bilateral lower extremities which showed no DVT.  She said no shortness of breath fevers or chills. Review of Systems See HPI  Objective: Vital Signs: Ht 5\' 4"  (1.626 m)   Wt 242 lb 6.4 oz (110 kg)   BMI 41.61 kg/m   Physical Exam Constitutional:      Appearance: She is not ill-appearing.  Cardiovascular:     Pulses: Normal pulses.  Pulmonary:     Effort: Pulmonary effort is normal.  Neurological:     Mental Status: She is alert and  oriented to person, place, and time.  Psychiatric:        Mood and Affect: Mood normal.     Ortho Exam Right foot she has callus under the first metatarsal head region.  There is no purulence no drainage.  This area is pared down by Dr. Sharol Given and reveals just significant callus with a crevice but no break in the epidermis.  She does have a tight gastrocs. Bilateral lower extremities she has pitting edema bilateral lower extremities.  No calf pain either leg. Bilateral knees she has significant patellofemoral crepitus with passive range of motion.  Tenderness globally both knees.  Good range of motion of both knees without significant pain.  No instability valgus  varus stressing of either knee.  No abnormal warmth erythema or effusion of either knee. Specialty Comments:  No specialty comments available.  Imaging: XR Knee 1-2 Views Left  Result Date: 12/04/2019 Left knee AP lateral views: No acute fractures.  Slight narrowing medial joint line.  Mild to moderate patellofemoral changes lateral compartment well-preserved.  Knee is well located.  XR Knee 1-2 Views Right  Result Date: 12/04/2019 Right knee AP lateral views: Knee is well located.  No acute fractures.  Slight narrowing medial joint line.  Mild to moderate patellofemoral changes.  Lateral joint line well-maintained.    PMFS History: Patient Active Problem List   Diagnosis Date Noted  . Primary osteoarthritis of right knee 12/04/2019  . Primary osteoarthritis of left knee 12/04/2019  . Vitamin D deficiency 09/20/2019  . Cellulitis of foot 09/18/2019  . Aspiration pneumonia of both lower lobes due to gastric secretions ([place]) 06/02/2019  . Lumbar disc herniation 05/04/2019  . Encounter for colorectal cancer screening 04/19/2019  . Acute respiratory distress 04/06/2019  . Hyperglycemia 04/06/2019  . Pleural effusion on right 04/06/2019  . Benzodiazepine dependence ([place]) 02/16/2019  . Cervical stenosis of spinal canal 02/15/2019  . Streptococcal sepsis ([place]) 01/22/2019  . Acute bronchitis due to Rhinovirus 11/06/2018  . Lung collapse 11/01/2018  . 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 Compression fracture of body of thoracic vertebra ([place]) 08/01/2018  . Closed nondisplaced fracture of greater tuberosity of right humerus 07/08/2018  . On rivaroxaban therapy 07/08/2018  . Chronic anticoagulation 05/27/2018  . Falls frequently 05/27/2018  . History of DVT (deep vein thrombosis) 03/16/2018  . History of pulmonary embolism 03/10/2018  . Left lower quadrant abdominal pain 03/10/2018  . Chronic pain syndrome 03/05/2018  . COPD  exacerbation ([place]) 06/09/2016  . Abnormal urine odor 09/26/2015  . Recurrent pulmonary embolism ([place]) 06/26/2015  . Lupus anticoagulant positive 06/26/2015  . Thrombocytopenia ([place]) 05/31/2015  . UTI (lower urinary tract infection) 05/26/2015  . Right flank pain   . Patchy loss of hair 03/08/2015  . Primary osteoarthritis of both knees 02/25/2015  . Incarcerated incisional hernia s/p lap repair w mesh 12/10/2014 12/10/2014  . DVT (deep venous thrombosis) ([place]) 10/31/2013  . Pulmonary embolism ([place]) 10/26/2013  . Kidney stone 10/04/2013  . Tobacco abuse 10/04/2013  . COPD (chronic obstructive pulmonary disease) ([place])   . Unspecified constipation 12/23/2012  . CKD (chronic kidney disease), stage III ([place]) 12/21/2012  . Allergic rhinitis 06/29/2012  . Chronic low back pain 06/29/2012  . Dysuria 06/29/2012  . Edema 06/29/2012  . Gastro-esophageal reflux disease without esophagitis 06/29/2012  . Hemorrhoids 06/29/2012  . Hirsutism 06/29/2012  . Insomnia 06/29/2012  . Localized superficial swelling, mass, or lump 06/29/2012  . Urinary incontinence 06/29/2012  .  Skin sensation disturbance 06/29/2012  . Shortness of breath 06/29/2012  . Pulmonary embolism and infarction ([place]) 06/29/2012  . Proteinuria 06/29/2012  . Nondependent cannabis abuse 06/29/2012  . Acquired cyst of kidney 10/24/2011  . 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 Cocaine abuse ([place]) 11/27/2010  . Depressive disorder 09/23/2009  . Ascites 09/23/2009  . Obesity 09/01/2008  . Hepatitis B virus infection 08/31/2008  . Acute hepatitis C virus infection 08/31/2008  . Iron deficiency anemia 08/31/2008  . Anxiety state 08/31/2008  . Essential hypertension 08/31/2008  . Coronary atherosclerosis 08/31/2008  . Hepatic cirrhosis ([place]) 08/31/2008   Past Medical History:  Diagnosis Date  . Agoraphobia with panic attacks   . Altered mental state 10/26/2013  . Ankle fracture, left 11/27/2010   S/p ORIF 11/2   .  Anxiety   . Arthritis    "knees; lower back" (10/30/2013)  . Bipolar disorder ([place])   . Cavitary pneumonia   . Cirrhosis ([place])   . CKD (chronic kidney disease), stage III ([place])   . COPD (chronic obstructive pulmonary disease) ([place])   . Depression   . DVT (deep venous thrombosis) ([place]) 09/2013   RLE  . GERD (gastroesophageal reflux disease)   . Heart murmur   . Hepatitis B   . History of blood transfusion    "due to excessive blood loss before hysterectomy"  . History of urinary tract infection   . Hypertension    currently on no medication   . Kidney stones   . Pneumonia    "4 times in the past year" (10/30/2013)  . Pulmonary emboli ([place]) 09/2013  . Shortness of breath dyspnea    increased exertion;exercise  . Urinary frequency   . Urinary incontinence     Family History  Problem Relation Age of Onset  . Stroke Mother   . Lung cancer Father     Past Surgical History:  Procedure Laterality Date  . ABDOMINAL HYSTERECTOMY    . ANKLE FRACTURE SURGERY Left   . CARPAL TUNNEL RELEASE Left   . DILATION AND CURETTAGE OF UTERUS  X 2  . FRACTURE SURGERY    . HAND SURGERY     1980's/left hand   . INSERTION OF MESH N/A 12/10/2014   Procedure: INSERTION OF MESH;  Surgeon: Michael Boston, MD;  Location: [facility];  Service: General;  Laterality: N/A;  . LAPAROSCOPIC ASSISTED VENTRAL HERNIA REPAIR N/A 12/10/2014   Procedure: LAPAROSCOPIC ASSISTED REPAIR OF INCARCERATED UMBILICAL HERNIA ;  Surgeon: Michael Boston, MD;  Location: [facility];  Service: General;  Laterality: N/A;  . TUBAL LIGATION     Social History   Occupational History  . Not on file  Tobacco Use  . Smoking status: Current Every Day Smoker    Packs/day: 0.50    Years: 35.00    Pack years: 17.50    Types: Cigarettes    Last attempt to quit: 10/26/2013    Years since quitting: 6.1  . Smokeless tobacco: Never Used  Substance and Sexual Activity  . Alcohol use: No    Alcohol/week: 0.0 standard drinks    Comment: "stopped  drinking  in 2004"  . Drug use: No    Types: "Crack" cocaine, Marijuana    Comment: 10/30/2013 "last crack in 2014; last marijuana in ~ 1990" drug free for last 3 years   . Sexual activity: Never

## 2019-12-09 ENCOUNTER — Telehealth: Payer: Self-pay

## 2019-12-09 NOTE — Telephone Encounter (Signed)
Bilateral knee gel injections 

## 2019-12-10 NOTE — Telephone Encounter (Signed)
Submitted for VOB for Synvisc one-Bilateral knee

## 2019-12-15 ENCOUNTER — Telehealth: Payer: Self-pay

## 2019-12-15 NOTE — Telephone Encounter (Signed)
Called and advised. She stated she will call back tomorrow to schedule. Ok to schedule @ next available

## 2019-12-15 NOTE — Telephone Encounter (Signed)
Approved for Synvisc one- Bilateral knee Buy and Bill Dr. Ninfa Linden No Copay 20% OOP No prior auth required

## 2019-12-31 ENCOUNTER — Emergency Department (HOSPITAL_COMMUNITY): Payer: Medicare Other

## 2019-12-31 ENCOUNTER — Encounter (HOSPITAL_COMMUNITY): Payer: Self-pay | Admitting: *Deleted

## 2019-12-31 ENCOUNTER — Emergency Department (HOSPITAL_COMMUNITY)
Admission: EM | Admit: 2019-12-31 | Discharge: 2019-12-31 | Disposition: A | Discharge status: Home / Self Care | Payer: Medicare Other | Attending: Emergency Medicine | Admitting: Emergency Medicine

## 2019-12-31 ENCOUNTER — Other Ambulatory Visit: Payer: Self-pay

## 2019-12-31 ENCOUNTER — Emergency Department (HOSPITAL_BASED_OUTPATIENT_CLINIC_OR_DEPARTMENT_OTHER): Payer: Medicare Other

## 2019-12-31 DIAGNOSIS — I129 Hypertensive chronic kidney disease with stage 1 through stage 4 chronic kidney disease, or unspecified chronic kidney disease: Secondary | ICD-10-CM | POA: Insufficient documentation

## 2019-12-31 DIAGNOSIS — N1831 Chronic kidney disease, stage 3a: Secondary | ICD-10-CM | POA: Diagnosis not present

## 2019-12-31 DIAGNOSIS — Z86718 Personal history of other venous thrombosis and embolism: Secondary | ICD-10-CM | POA: Diagnosis not present

## 2019-12-31 DIAGNOSIS — M25519 Pain in unspecified shoulder: Secondary | ICD-10-CM | POA: Diagnosis not present

## 2019-12-31 DIAGNOSIS — M7989 Other specified soft tissue disorders: Secondary | ICD-10-CM

## 2019-12-31 DIAGNOSIS — F17210 Nicotine dependence, cigarettes, uncomplicated: Secondary | ICD-10-CM | POA: Insufficient documentation

## 2019-12-31 DIAGNOSIS — J449 Chronic obstructive pulmonary disease, unspecified: Secondary | ICD-10-CM | POA: Insufficient documentation

## 2019-12-31 DIAGNOSIS — Z20822 Contact with and (suspected) exposure to covid-19: Secondary | ICD-10-CM | POA: Insufficient documentation

## 2019-12-31 DIAGNOSIS — J40 Bronchitis, not specified as acute or chronic: Secondary | ICD-10-CM | POA: Diagnosis not present

## 2019-12-31 DIAGNOSIS — Z86711 Personal history of pulmonary embolism: Secondary | ICD-10-CM | POA: Diagnosis not present

## 2019-12-31 DIAGNOSIS — G8929 Other chronic pain: Secondary | ICD-10-CM | POA: Insufficient documentation

## 2019-12-31 DIAGNOSIS — M549 Dorsalgia, unspecified: Secondary | ICD-10-CM | POA: Insufficient documentation

## 2019-12-31 DIAGNOSIS — R079 Chest pain, unspecified: Secondary | ICD-10-CM

## 2019-12-31 DIAGNOSIS — R0602 Shortness of breath: Secondary | ICD-10-CM | POA: Diagnosis present

## 2019-12-31 LAB — URINALYSIS, ROUTINE W REFLEX MICROSCOPIC
Bilirubin Urine: NEGATIVE
Glucose, UA: NEGATIVE mg/dL
Hgb urine dipstick: NEGATIVE
Ketones, ur: NEGATIVE mg/dL
Leukocytes,Ua: NEGATIVE
Nitrite: NEGATIVE
Protein, ur: NEGATIVE mg/dL
Specific Gravity, Urine: 1.005 (ref 1.005–1.030)
pH: 6 (ref 5.0–8.0)

## 2019-12-31 LAB — BASIC METABOLIC PANEL
Anion gap: 11 (ref 5–15)
BUN: 15 mg/dL (ref 8–23)
CO2: 23 mmol/L (ref 22–32)
Calcium: 9.4 mg/dL (ref 8.9–10.3)
Chloride: 104 mmol/L (ref 98–111)
Creatinine, Ser: 1.28 mg/dL — ABNORMAL HIGH (ref 0.44–1.00)
GFR, Estimated: 47 mL/min — ABNORMAL LOW (ref >60)
Glucose, Bld: 188 mg/dL — ABNORMAL HIGH (ref 70–99)
Potassium: 3.6 mmol/L (ref 3.5–5.1)
Sodium: 138 mmol/L (ref 135–145)

## 2019-12-31 LAB — TROPONIN I (HIGH SENSITIVITY)
Troponin I (High Sensitivity): 12 ng/L (ref <18)
Troponin I (High Sensitivity): 10 ng/L (ref <18)

## 2019-12-31 LAB — RESP PANEL BY RT-PCR (FLU A&B, COVID) ARPGX2
Influenza A by PCR: NEGATIVE
Influenza B by PCR: NEGATIVE
SARS Coronavirus 2 by RT PCR: NEGATIVE

## 2019-12-31 LAB — CBC
HCT: 37.7 % (ref 36.0–46.0)
Hemoglobin: 11.5 g/dL — ABNORMAL LOW (ref 12.0–15.0)
MCH: 26.8 pg (ref 26.0–34.0)
MCHC: 30.5 g/dL (ref 30.0–36.0)
MCV: 87.9 fL (ref 80.0–100.0)
Platelets: 134 10*3/uL — ABNORMAL LOW (ref 150–400)
RBC: 4.29 MIL/uL (ref 3.87–5.11)
RDW: 15.3 % (ref 11.5–15.5)
WBC: 6.2 10*3/uL (ref 4.0–10.5)
nRBC: 0 % (ref 0.0–0.2)

## 2019-12-31 LAB — D-DIMER, QUANTITATIVE: D-Dimer, Quant: 2.17 ug/mL-FEU — ABNORMAL HIGH (ref 0.00–0.50)

## 2019-12-31 MED ORDER — IOHEXOL 350 MG/ML SOLN
63.0000 mL | Freq: Once | INTRAVENOUS | Status: AC | PRN
  Administered 2019-12-31: 63 mL via INTRAVENOUS

## 2019-12-31 MED ORDER — OXYCODONE HCL 5 MG PO TABS
10.0000 mg | ORAL_TABLET | Freq: Once | ORAL | Status: AC
  Administered 2019-12-31: 10 mg via ORAL
  Filled 2019-12-31: qty 2

## 2019-12-31 MED ORDER — IPRATROPIUM-ALBUTEROL 0.5-2.5 (3) MG/3ML IN SOLN
3.0000 mL | Freq: Once | RESPIRATORY_TRACT | Status: AC
  Administered 2019-12-31: 3 mL via RESPIRATORY_TRACT
  Filled 2019-12-31: qty 3

## 2019-12-31 MED ORDER — GUAIFENESIN ER 600 MG PO TB12: 600.0000 mg | ORAL_TABLET | Freq: Two times a day (BID) | ORAL | 0 refills | Status: DC | PRN

## 2019-12-31 MED ORDER — MORPHINE SULFATE (PF) 4 MG/ML IV SOLN
8.0000 mg | Freq: Once | INTRAVENOUS | Status: AC
  Administered 2019-12-31: 8 mg via INTRAVENOUS
  Filled 2019-12-31: qty 2

## 2019-12-31 NOTE — ED Notes (Signed)
Pt says she last used her inhaler about 2 hours ago.

## 2019-12-31 NOTE — ED Provider Notes (Signed)
[facility] Emergency Department Provider Note MRN:  [id number]  Arrival date & time: 12/31/19     Chief Complaint   Shortness of Breath   History of Present Illness   [name] is a 64 y.o. year-old female with a history of COPD, VTE presenting to the ED with chief complaint of shortness of breath.  Patient has multiple complaints today.  She is endorsing continued chronic shoulder and back pain.  The back pain seems a bit worse than normal since having a fall 1 month ago.  She is followed by pain management.  She is also endorsing right-sided chest pain and shortness of breath for the past 3 to 4 days.  Intermittent.  Endorsing mucus that she cannot seem to cough up.  Denies fever, no abdominal pain.  Also endorsing lower extremity swelling that is worse in the right leg for the past couple weeks.  She explains that she did not take her blood thinner for 2 days due to needing a colonoscopy.  This was about 1 or 2 weeks ago.  Review of Systems  A complete 10 system review of systems was obtained and all systems are negative except as noted in the HPI and PMH.   Patient's Health History    Past Medical History:  Diagnosis Date  . Agoraphobia with panic attacks   . Altered mental state 10/26/2013  . Ankle fracture, left 11/27/2010   S/p ORIF 11/2   . Anxiety   . Arthritis    "knees; lower back" (10/30/2013)  . Bipolar disorder ([place])   . Cavitary pneumonia   . Cirrhosis ([place])   . CKD (chronic kidney disease), stage III ([place])   . COPD (chronic obstructive pulmonary disease) ([place])   . Depression   . DVT (deep venous thrombosis) ([place]) 09/2013   RLE  . GERD (gastroesophageal reflux disease)   . Heart murmur   . Hepatitis B   . History of blood transfusion    "due to excessive blood loss before hysterectomy"  . History of urinary tract infection   . Hypertension    currently on no medication   . Kidney stones   . Pneumonia    "4 times in the past year"  (10/30/2013)  . Pulmonary emboli ([place]) 09/2013  . Shortness of breath dyspnea    increased exertion;exercise  . Urinary frequency   . Urinary incontinence     Past Surgical History:  Procedure Laterality Date  . ABDOMINAL HYSTERECTOMY    . ANKLE FRACTURE SURGERY Left   . CARPAL TUNNEL RELEASE Left   . DILATION AND CURETTAGE OF UTERUS  X 2  . FRACTURE SURGERY    . HAND SURGERY     1980's/left hand   . INSERTION OF MESH N/A 12/10/2014   Procedure: INSERTION OF MESH;  Surgeon:  Boston, MD;  Location: [facility];  Service: General;  Laterality: N/A;  . LAPAROSCOPIC ASSISTED VENTRAL HERNIA REPAIR N/A 12/10/2014   Procedure: LAPAROSCOPIC ASSISTED REPAIR OF INCARCERATED UMBILICAL HERNIA ;  Surgeon:  Boston, MD;  Location: [facility];  Service: General;  Laterality: N/A;  . TUBAL LIGATION      Family History  Problem Relation Age of Onset  . Stroke Mother   . Lung cancer Father     Social History   Socioeconomic History  . Marital status: Single    Spouse name: Not on file  . Number of children: Not on file  . Years of education: Not on file  .  Highest education level: Not on file  Occupational History  . Not on file  Tobacco Use  . Smoking status: Current Every Day Smoker    Packs/day: 0.50    Years: 35.00    Pack years: 17.50    Types: Cigarettes    Last attempt to quit: 10/26/2013    Years since quitting: 6.1  . Smokeless tobacco: Never Used  Substance and Sexual Activity  . Alcohol use: No    Alcohol/week: 0.0 standard drinks    Comment: "stopped drinking  in 2004"  . Drug use: No    Types: "Crack" cocaine, Marijuana    Comment: 10/30/2013 "last crack in 2014; last marijuana in ~ 1990" drug free for last 3 years   . Sexual activity: Never  Other Topics Concern  . Not on file  Social History Narrative  . Not on file   Social Determinants of Health   Financial Resource Strain:   . Difficulty of Paying Living Expenses: Not on file  Food Insecurity:   . Worried  About Charity fundraiser in the Last Year: Not on file  . Ran Out of Food in the Last Year: Not on file  Transportation Needs:   . Lack of Transportation (Medical): Not on file  . Lack of Transportation (Non-Medical): Not on file  Physical Activity:   . Days of Exercise per Week: Not on file  . Minutes of Exercise per Session: Not on file  Stress:   . Feeling of Stress : Not on file  Social Connections:   . Frequency of Communication with Friends and Family: Not on file  . Frequency of Social Gatherings with Friends and Family: Not on file  . Attends Religious Services: Not on file  . Active Member of Clubs or Organizations: Not on file  . Attends Archivist Meetings: Not on file  . Marital Status: Not on file  Intimate Partner Violence:   . Fear of Current or Ex-Partner: Not on file  . Emotionally Abused: Not on file  . Physically Abused: Not on file  . Sexually Abused: Not on file     Physical Exam   Vitals:   12/31/19 1020 12/31/19 1200  BP: (!) 163/95 (!) 149/92  Pulse: 85 73  Resp: 17 18  Temp:    SpO2: 97% 93%    CONSTITUTIONAL: Chronically ill-appearing, NAD NEURO:  Alert and oriented x 3, no focal deficits EYES:  eyes equal and reactive ENT/NECK:  no LAD, no JVD CARDIO: Regular rate, well-perfused, normal S1 and S2 PULM:  CTAB no wheezing or rhonchi GI/GU:  normal bowel sounds, non-distended, non-tender MSK/SPINE:  No gross deformities, no edema SKIN:  no rash, atraumatic PSYCH:  Appropriate speech and behavior  *Additional and/or pertinent findings included in MDM below  Diagnostic and Interventional Summary    EKG Interpretation  Date/Time:  Wednesday December 31 2019 01:32:10 EST Ventricular Rate:  99 PR Interval:  146 QRS Duration: 92 QT Interval:  352 QTC Calculation: 451 R Axis:   -27 Text Interpretation: Normal sinus rhythm Normal ECG No significant change was found Confirmed by Gerlene Fee [phone]) on 12/31/2019 8:21:59 AM       Labs Reviewed  BASIC METABOLIC PANEL - Abnormal; Notable for the following components:      Result Value   Glucose, Bld 188 (*)    Creatinine, Ser 1.28 (*)    GFR, Estimated 47 (*)    All other components within normal limits  CBC - Abnormal;  Notable for the following components:   Hemoglobin 11.5 (*)    Platelets 134 (*)    All other components within normal limits  D-DIMER, QUANTITATIVE (NOT AT [facility]) - Abnormal; Notable for the following components:   D-Dimer, Quant 2.17 (*)    All other components within normal limits  URINALYSIS, ROUTINE W REFLEX MICROSCOPIC - Abnormal; Notable for the following components:   Color, Urine STRAW (*)    All other components within normal limits  RESP PANEL BY RT-PCR (FLU A&B, COVID) [id number]  TROPONIN I (HIGH SENSITIVITY)  TROPONIN I (HIGH SENSITIVITY)    CT ANGIO CHEST PE W OR WO CONTRAST  Final Result    DG Lumbar Spine Complete  Final Result    VAS [place] LOWER EXTREMITY VENOUS (DVT) (ONLY [facility] & [facility])      DG Chest Portable 1 View  Final Result      Medications  ipratropium-albuterol (DUONEB) 0.5-2.5 (3) MG/3ML nebulizer solution 3 mL (3 mLs Nebulization Given 12/31/19 0846)  oxyCODONE (Oxy IR/ROXICODONE) immediate release tablet 10 mg (10 mg Oral Given 12/31/19 0844)  morphine 4 MG/ML injection 8 mg (8 mg Intravenous Given 12/31/19 1103)  iohexol (OMNIPAQUE) 350 MG/ML injection 63 mL (63 mLs Intravenous Contrast Given 12/31/19 1146)     Procedures  /  Critical Care Procedures  ED Course and Medical Decision Making  I have reviewed the triage vital signs, the nursing notes, and pertinent available records from the EMR.  Listed above are laboratory and imaging tests that I personally ordered, reviewed, and interpreted and then considered in my medical decision making (see below for details).  Chronic pain, recent fall, will obtain x-ray of the lumbar spine to exclude obvious changes or compression fracture.  She has no bowel or bladder  dysfunction, no numbness or weakness, nothing to suggest myelopathy.  She is having some atypical chest pain, shortness of breath that seems most likely to be due to bronchitis or COPD flare.  No increased work of breathing, no hypoxia.  However given the asymmetric edema and her history of VTE and the recent couple days without blood thinners there remains concern for VTE.  Screening with D-dimer and leg ultrasounds.     D-dimer positive, CTA negative, ultrasound negative for DVT.  Work-up is overall reassuring, suspect bronchitis, appropriate for discharge.  Barth Kirks. Sedonia Small, [place] [email or web address]  Final Clinical Impressions(s) / ED Diagnoses     ICD-10-CM   1. Chest pain, unspecified type  R07.9   2. Leg swelling  M79.89   3. Other chronic pain  G89.29   4. Bronchitis  J40     ED Discharge Orders         Ordered    guaiFENesin (MUCINEX) 600 MG 12 hr tablet  2 times daily PRN        12/31/19 1241           Discharge Instructions Discussed with and Provided to Patient:     Discharge Instructions     You were evaluated in the Emergency Department and after careful evaluation, we did not find any emergent condition requiring admission or further testing in the hospital.  Your exam/testing today was overall reassuring.  Symptoms seem to be due to bronchitis.  Please take the Mucinex as needed and follow-up with your primary care doctor.  Please return to the Emergency Department if you experience any worsening of your condition.  Thank you for allowing [place] to be  a part of your care.        Maudie Flakes, MD 12/31/19 1242

## 2019-12-31 NOTE — Progress Notes (Signed)
Lower extremity venous bilateral study completed.  Preliminary results relayed to Sedonia Small, MD.   See CV Proc for preliminary results report.   Darlin Coco, RDMS

## 2019-12-31 NOTE — Discharge Instructions (Addendum)
You were evaluated in the Emergency Department and after careful evaluation, we did not find any emergent condition requiring admission or further testing in the hospital.  Your exam/testing today was overall reassuring.  Symptoms seem to be due to bronchitis.  Please take the Mucinex as needed and follow-up with your primary care doctor.  Please return to the Emergency Department if you experience any worsening of your condition.  Thank you for allowing [place] to be a part of your care.

## 2019-12-31 NOTE — ED Notes (Signed)
Patient transported to X-ray 

## 2019-12-31 NOTE — ED Triage Notes (Signed)
Lower back pain (chronic), 3 days of SOB with productive cough, right leg swelling and redness.

## 2019-12-31 NOTE — ED Notes (Signed)
Help get patient on the monitor into a gown patient is resting with call bell in reach °

## 2020-01-04 ENCOUNTER — Inpatient Hospital Stay (HOSPITAL_COMMUNITY)
Admission: EM | Admit: 2020-01-04 | Discharge: 2020-01-08 | DRG: 871 | Disposition: A | Discharge status: Home / Self Care | Payer: Medicare Other | Attending: Internal Medicine | Admitting: Internal Medicine

## 2020-01-04 ENCOUNTER — Encounter (HOSPITAL_COMMUNITY): Payer: Self-pay

## 2020-01-04 ENCOUNTER — Other Ambulatory Visit: Payer: Self-pay

## 2020-01-04 ENCOUNTER — Emergency Department (HOSPITAL_COMMUNITY): Payer: Medicare Other

## 2020-01-04 DIAGNOSIS — R6520 Severe sepsis without septic shock: Secondary | ICD-10-CM | POA: Diagnosis present

## 2020-01-04 DIAGNOSIS — J189 Pneumonia, unspecified organism: Secondary | ICD-10-CM | POA: Diagnosis present

## 2020-01-04 DIAGNOSIS — K219 Gastro-esophageal reflux disease without esophagitis: Secondary | ICD-10-CM | POA: Diagnosis present

## 2020-01-04 DIAGNOSIS — Z79899 Other long term (current) drug therapy: Secondary | ICD-10-CM

## 2020-01-04 DIAGNOSIS — I272 Pulmonary hypertension, unspecified: Secondary | ICD-10-CM

## 2020-01-04 DIAGNOSIS — K5903 Drug induced constipation: Secondary | ICD-10-CM | POA: Diagnosis not present

## 2020-01-04 DIAGNOSIS — N179 Acute kidney failure, unspecified: Secondary | ICD-10-CM | POA: Diagnosis present

## 2020-01-04 DIAGNOSIS — Z7901 Long term (current) use of anticoagulants: Secondary | ICD-10-CM

## 2020-01-04 DIAGNOSIS — Z86718 Personal history of other venous thrombosis and embolism: Secondary | ICD-10-CM

## 2020-01-04 DIAGNOSIS — D631 Anemia in chronic kidney disease: Secondary | ICD-10-CM | POA: Diagnosis present

## 2020-01-04 DIAGNOSIS — F17210 Nicotine dependence, cigarettes, uncomplicated: Secondary | ICD-10-CM | POA: Diagnosis present

## 2020-01-04 DIAGNOSIS — Z86711 Personal history of pulmonary embolism: Secondary | ICD-10-CM | POA: Diagnosis not present

## 2020-01-04 DIAGNOSIS — E872 Acidosis: Secondary | ICD-10-CM | POA: Diagnosis not present

## 2020-01-04 DIAGNOSIS — J449 Chronic obstructive pulmonary disease, unspecified: Secondary | ICD-10-CM

## 2020-01-04 DIAGNOSIS — Z90710 Acquired absence of both cervix and uterus: Secondary | ICD-10-CM

## 2020-01-04 DIAGNOSIS — A419 Sepsis, unspecified organism: Principal | ICD-10-CM | POA: Diagnosis present

## 2020-01-04 DIAGNOSIS — Z885 Allergy status to narcotic agent status: Secondary | ICD-10-CM

## 2020-01-04 DIAGNOSIS — G8929 Other chronic pain: Secondary | ICD-10-CM | POA: Diagnosis not present

## 2020-01-04 DIAGNOSIS — F32A Depression, unspecified: Secondary | ICD-10-CM | POA: Diagnosis present

## 2020-01-04 DIAGNOSIS — Z87442 Personal history of urinary calculi: Secondary | ICD-10-CM

## 2020-01-04 DIAGNOSIS — F4001 Agoraphobia with panic disorder: Secondary | ICD-10-CM | POA: Diagnosis present

## 2020-01-04 DIAGNOSIS — Z888 Allergy status to other drugs, medicaments and biological substances status: Secondary | ICD-10-CM

## 2020-01-04 DIAGNOSIS — R7989 Other specified abnormal findings of blood chemistry: Secondary | ICD-10-CM | POA: Diagnosis present

## 2020-01-04 DIAGNOSIS — J690 Pneumonitis due to inhalation of food and vomit: Secondary | ICD-10-CM | POA: Diagnosis present

## 2020-01-04 DIAGNOSIS — G894 Chronic pain syndrome: Secondary | ICD-10-CM | POA: Diagnosis present

## 2020-01-04 DIAGNOSIS — F319 Bipolar disorder, unspecified: Secondary | ICD-10-CM | POA: Diagnosis present

## 2020-01-04 DIAGNOSIS — Z20822 Contact with and (suspected) exposure to covid-19: Secondary | ICD-10-CM | POA: Diagnosis present

## 2020-01-04 DIAGNOSIS — R748 Abnormal levels of other serum enzymes: Secondary | ICD-10-CM | POA: Diagnosis not present

## 2020-01-04 DIAGNOSIS — I2721 Secondary pulmonary arterial hypertension: Secondary | ICD-10-CM | POA: Diagnosis present

## 2020-01-04 DIAGNOSIS — I5032 Chronic diastolic (congestive) heart failure: Secondary | ICD-10-CM | POA: Diagnosis present

## 2020-01-04 DIAGNOSIS — Z8619 Personal history of other infectious and parasitic diseases: Secondary | ICD-10-CM

## 2020-01-04 DIAGNOSIS — M47819 Spondylosis without myelopathy or radiculopathy, site unspecified: Secondary | ICD-10-CM | POA: Diagnosis present

## 2020-01-04 DIAGNOSIS — K5900 Constipation, unspecified: Secondary | ICD-10-CM | POA: Diagnosis present

## 2020-01-04 DIAGNOSIS — J441 Chronic obstructive pulmonary disease with (acute) exacerbation: Secondary | ICD-10-CM | POA: Diagnosis present

## 2020-01-04 DIAGNOSIS — R4182 Altered mental status, unspecified: Secondary | ICD-10-CM | POA: Diagnosis present

## 2020-01-04 DIAGNOSIS — J440 Chronic obstructive pulmonary disease with acute lower respiratory infection: Secondary | ICD-10-CM | POA: Diagnosis present

## 2020-01-04 DIAGNOSIS — B1910 Unspecified viral hepatitis B without hepatic coma: Secondary | ICD-10-CM | POA: Diagnosis present

## 2020-01-04 DIAGNOSIS — G4700 Insomnia, unspecified: Secondary | ICD-10-CM | POA: Diagnosis present

## 2020-01-04 DIAGNOSIS — G9341 Metabolic encephalopathy: Secondary | ICD-10-CM | POA: Diagnosis present

## 2020-01-04 DIAGNOSIS — R0602 Shortness of breath: Secondary | ICD-10-CM | POA: Diagnosis present

## 2020-01-04 DIAGNOSIS — I959 Hypotension, unspecified: Secondary | ICD-10-CM | POA: Diagnosis present

## 2020-01-04 DIAGNOSIS — Z7951 Long term (current) use of inhaled steroids: Secondary | ICD-10-CM

## 2020-01-04 DIAGNOSIS — N1831 Chronic kidney disease, stage 3a: Secondary | ICD-10-CM | POA: Diagnosis present

## 2020-01-04 DIAGNOSIS — I130 Hypertensive heart and chronic kidney disease with heart failure and stage 1 through stage 4 chronic kidney disease, or unspecified chronic kidney disease: Secondary | ICD-10-CM | POA: Diagnosis present

## 2020-01-04 DIAGNOSIS — Z801 Family history of malignant neoplasm of trachea, bronchus and lung: Secondary | ICD-10-CM

## 2020-01-04 DIAGNOSIS — Z823 Family history of stroke: Secondary | ICD-10-CM

## 2020-01-04 DIAGNOSIS — N189 Chronic kidney disease, unspecified: Secondary | ICD-10-CM

## 2020-01-04 LAB — COMPREHENSIVE METABOLIC PANEL
ALT: 50 U/L — ABNORMAL HIGH (ref 0–44)
AST: 66 U/L — ABNORMAL HIGH (ref 15–41)
Albumin: 3.4 g/dL — ABNORMAL LOW (ref 3.5–5.0)
Alkaline Phosphatase: 101 U/L (ref 38–126)
Anion gap: 15 (ref 5–15)
BUN: 17 mg/dL (ref 8–23)
CO2: 25 mmol/L (ref 22–32)
Calcium: 9.1 mg/dL (ref 8.9–10.3)
Chloride: 97 mmol/L — ABNORMAL LOW (ref 98–111)
Creatinine, Ser: 1.75 mg/dL — ABNORMAL HIGH (ref 0.44–1.00)
GFR, Estimated: 32 mL/min — ABNORMAL LOW (ref >60)
Glucose, Bld: 122 mg/dL — ABNORMAL HIGH (ref 70–99)
Potassium: 4 mmol/L (ref 3.5–5.1)
Sodium: 137 mmol/L (ref 135–145)
Total Bilirubin: 0.7 mg/dL (ref 0.3–1.2)
Total Protein: 6.6 g/dL (ref 6.5–8.1)

## 2020-01-04 LAB — CBC WITH DIFFERENTIAL/PLATELET
Abs Immature Granulocytes: 0.06 10*3/uL (ref 0.00–0.07)
Basophils Absolute: 0 10*3/uL (ref 0.0–0.1)
Basophils Relative: 0 %
Eosinophils Absolute: 0 10*3/uL (ref 0.0–0.5)
Eosinophils Relative: 0 %
HCT: 36.2 % (ref 36.0–46.0)
Hemoglobin: 11.4 g/dL — ABNORMAL LOW (ref 12.0–15.0)
Immature Granulocytes: 1 %
Lymphocytes Relative: 5 %
Lymphs Abs: 0.6 10*3/uL — ABNORMAL LOW (ref 0.7–4.0)
MCH: 27.2 pg (ref 26.0–34.0)
MCHC: 31.5 g/dL (ref 30.0–36.0)
MCV: 86.4 fL (ref 80.0–100.0)
Monocytes Absolute: 0.7 10*3/uL (ref 0.1–1.0)
Monocytes Relative: 7 %
Neutro Abs: 9.5 10*3/uL — ABNORMAL HIGH (ref 1.7–7.7)
Neutrophils Relative %: 87 %
Platelets: 146 10*3/uL — ABNORMAL LOW (ref 150–400)
RBC: 4.19 MIL/uL (ref 3.87–5.11)
RDW: 15.1 % (ref 11.5–15.5)
WBC: 10.9 10*3/uL — ABNORMAL HIGH (ref 4.0–10.5)
nRBC: 0 % (ref 0.0–0.2)

## 2020-01-04 LAB — URINALYSIS, ROUTINE W REFLEX MICROSCOPIC
Bilirubin Urine: NEGATIVE
Glucose, UA: NEGATIVE mg/dL
Hgb urine dipstick: NEGATIVE
Ketones, ur: NEGATIVE mg/dL
Leukocytes,Ua: NEGATIVE
Nitrite: NEGATIVE
Protein, ur: NEGATIVE mg/dL
Specific Gravity, Urine: 1.006 (ref 1.005–1.030)
pH: 8 (ref 5.0–8.0)

## 2020-01-04 LAB — RESPIRATORY PANEL BY PCR

## 2020-01-04 LAB — LACTIC ACID, PLASMA
Lactic Acid, Venous: 2.9 mmol/L (ref 0.5–1.9)
Lactic Acid, Venous: 2.4 mmol/L (ref 0.5–1.9)

## 2020-01-04 LAB — HEPATITIS PANEL, ACUTE
HCV Ab: REACTIVE — AB
Hep A IgM: NONREACTIVE
Hep B C IgM: NONREACTIVE
Hepatitis B Surface Ag: NONREACTIVE

## 2020-01-04 LAB — I-STAT VENOUS BLOOD GAS, ED
Acid-Base Excess: 6 mmol/L — ABNORMAL HIGH (ref 0.0–2.0)
Bicarbonate: 30.2 mmol/L — ABNORMAL HIGH (ref 20.0–28.0)
Calcium, Ion: 1.12 mmol/L — ABNORMAL LOW (ref 1.15–1.40)
HCT: 36 % (ref 36.0–46.0)
Hemoglobin: 12.2 g/dL (ref 12.0–15.0)
O2 Saturation: 95 %
Potassium: 3.9 mmol/L (ref 3.5–5.1)
Sodium: 135 mmol/L (ref 135–145)
TCO2: 31 mmol/L (ref 22–32)
pCO2, Ven: 40.3 mmHg — ABNORMAL LOW (ref 44.0–60.0)
pH, Ven: 7.482 — ABNORMAL HIGH (ref 7.250–7.430)
pO2, Ven: 72 mmHg — ABNORMAL HIGH (ref 32.0–45.0)

## 2020-01-04 LAB — RAPID HIV SCREEN (HIV 1/2 AB+AG)
HIV 1/2 Antibodies: NONREACTIVE
HIV-1 P24 Antigen - HIV24: NONREACTIVE

## 2020-01-04 LAB — PROCALCITONIN: Procalcitonin: 14.27 ng/mL

## 2020-01-04 LAB — PROTIME-INR
INR: 2 — ABNORMAL HIGH (ref 0.8–1.2)
Prothrombin Time: 22.3 seconds — ABNORMAL HIGH (ref 11.4–15.2)

## 2020-01-04 LAB — RESP PANEL BY RT-PCR (FLU A&B, COVID) ARPGX2
Influenza A by PCR: NEGATIVE
Influenza B by PCR: NEGATIVE
SARS Coronavirus 2 by RT PCR: NEGATIVE

## 2020-01-04 LAB — AMMONIA: Ammonia: 20 umol/L (ref 9–35)

## 2020-01-04 LAB — APTT: aPTT: 36 seconds (ref 24–36)

## 2020-01-04 MED ORDER — NALOXONE HCL 0.4 MG/ML IJ SOLN
0.4000 mg | Freq: Once | INTRAMUSCULAR | Status: AC
  Administered 2020-01-04: 18:00:00 0.4 mg via INTRAVENOUS
  Filled 2020-01-04: qty 1

## 2020-01-04 MED ORDER — BUDESONIDE 0.5 MG/2ML IN SUSP
0.5000 mg | Freq: Two times a day (BID) | RESPIRATORY_TRACT | Status: DC
  Administered 2020-01-05 – 2020-01-08 (×7): 0.5 mg via RESPIRATORY_TRACT
  Filled 2020-01-04 (×9): qty 2

## 2020-01-04 MED ORDER — ONDANSETRON HCL 4 MG PO TABS
4.0000 mg | ORAL_TABLET | Freq: Four times a day (QID) | ORAL | Status: DC | PRN
  Administered 2020-01-07: 23:00:00 4 mg via ORAL
  Filled 2020-01-04: qty 1

## 2020-01-04 MED ORDER — BISACODYL 10 MG RE SUPP: 10.0000 mg | Freq: Every day | RECTAL | Status: DC | PRN

## 2020-01-04 MED ORDER — LACTATED RINGERS IV BOLUS (SEPSIS)
500.0000 mL | Freq: Once | INTRAVENOUS | Status: AC
  Administered 2020-01-04: 14:00:00 500 mL via INTRAVENOUS

## 2020-01-04 MED ORDER — ARFORMOTEROL TARTRATE 15 MCG/2ML IN NEBU
15.0000 ug | INHALATION_SOLUTION | Freq: Two times a day (BID) | RESPIRATORY_TRACT | Status: DC
  Administered 2020-01-05 – 2020-01-08 (×7): 15 ug via RESPIRATORY_TRACT
  Filled 2020-01-04 (×9): qty 2

## 2020-01-04 MED ORDER — SODIUM CHLORIDE 0.9 % IV SOLN
2.0000 g | INTRAVENOUS | Status: DC
  Administered 2020-01-04: 17:00:00 2 g via INTRAVENOUS
  Filled 2020-01-04: qty 20

## 2020-01-04 MED ORDER — LACTATED RINGERS IV SOLN: INTRAVENOUS | Status: AC

## 2020-01-04 MED ORDER — SODIUM CHLORIDE 0.9 % IV SOLN
500.0000 mg | INTRAVENOUS | Status: DC
  Administered 2020-01-04: 17:00:00 500 mg via INTRAVENOUS
  Filled 2020-01-04: qty 500

## 2020-01-04 MED ORDER — HEPARIN (PORCINE) 25000 UT/250ML-% IV SOLN
1750.0000 [IU]/h | INTRAVENOUS | Status: AC
  Administered 2020-01-04 – 2020-01-05 (×2): 1750 [IU]/h via INTRAVENOUS
  Filled 2020-01-04 (×2): qty 250

## 2020-01-04 MED ORDER — ACETAMINOPHEN 650 MG RE SUPP: 650.0000 mg | Freq: Four times a day (QID) | RECTAL | Status: DC | PRN

## 2020-01-04 MED ORDER — LACTATED RINGERS IV BOLUS
1000.0000 mL | Freq: Once | INTRAVENOUS | Status: AC
  Administered 2020-01-04: 17:00:00 1000 mL via INTRAVENOUS

## 2020-01-04 MED ORDER — IPRATROPIUM-ALBUTEROL 0.5-2.5 (3) MG/3ML IN SOLN
3.0000 mL | RESPIRATORY_TRACT | Status: DC | PRN
  Administered 2020-01-05 – 2020-01-08 (×4): 3 mL via RESPIRATORY_TRACT
  Filled 2020-01-04 (×4): qty 3

## 2020-01-04 MED ORDER — ONDANSETRON HCL 4 MG/2ML IJ SOLN
4.0000 mg | Freq: Four times a day (QID) | INTRAMUSCULAR | Status: DC | PRN
  Administered 2020-01-07: 11:00:00 4 mg via INTRAVENOUS
  Filled 2020-01-04 (×2): qty 2

## 2020-01-04 MED ORDER — ACETAMINOPHEN 325 MG PO TABS: 650.0000 mg | ORAL_TABLET | Freq: Four times a day (QID) | ORAL | Status: DC | PRN

## 2020-01-04 NOTE — ED Provider Notes (Signed)
Emergency Department Provider Note   I have reviewed the triage vital signs and the nursing notes.   HISTORY  Chief Complaint Code Sepsis and AMS   HPI [name] is a 64 y.o. female with PMH reviewed below presents to the ED for evaluation of cough, congestion, fever, confusion. Symptoms have been progressively worsening over the last several days. No known sick contacts.  Patient has history of COPD but does not use home oxygen.  She arrives by EMS who found her to be febrile with some confusion.  They report rhonchi on her lung exam and started supplemental oxygen as well as IV access.  Patient reportedly has history of DVT and is on anticoagulation.  She denies any chest pain but feels like her breathing is worse than normal.  She denies any abdominal pain but is having some nausea but no vomiting.  She notes constipation as well as urine frequency.   Level 5 caveat: Some confusion. Patient able to provide some history but unclear how reliable it is at times.   Past Medical History:  Diagnosis Date  . Agoraphobia with panic attacks   . Altered mental state 10/26/2013  . Ankle fracture, left 11/27/2010   S/p ORIF 11/2   . Anxiety   . Arthritis    "knees; lower back" (10/30/2013)  . Bipolar disorder ([place])   . Cavitary pneumonia   . Cirrhosis ([place])   . CKD (chronic kidney disease), stage III ([place])   . COPD (chronic obstructive pulmonary disease) ([place])   . Depression   . DVT (deep venous thrombosis) ([place]) 09/2013   RLE  . GERD (gastroesophageal reflux disease)   . Heart murmur   . Hepatitis B   . History of blood transfusion    "due to excessive blood loss before hysterectomy"  . History of urinary tract infection   . Hypertension    currently on no medication   . Kidney stones   . Pneumonia    "4 times in the past year" (10/30/2013)  . Pulmonary emboli ([place]) 09/2013  . Shortness of breath dyspnea    increased exertion;exercise  . Urinary frequency   . Urinary  incontinence     Patient Active Problem List   Diagnosis Date Noted  . Severe sepsis with acute organ dysfunction ([place]) 01/04/2020  . Primary osteoarthritis of right knee 12/04/2019  . Primary osteoarthritis of left knee 12/04/2019  . Vitamin D deficiency 09/20/2019  . Cellulitis of foot 09/18/2019  . Aspiration pneumonia of both lower lobes due to gastric secretions ([place]) 06/02/2019  . Lumbar disc herniation 05/04/2019  . Encounter for colorectal cancer screening 04/19/2019  . Acute respiratory distress 04/06/2019  . Hyperglycemia 04/06/2019  . Pleural effusion on right 04/06/2019  . Benzodiazepine dependence ([place]) 02/16/2019  . Cervical stenosis of spinal canal 02/15/2019  . Streptococcal sepsis ([place]) 01/22/2019  . Acute bronchitis due to Rhinovirus 11/06/2018  . Lung collapse 11/01/2018  . 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 Compression fracture of body of thoracic vertebra ([place]) 08/01/2018  . Closed nondisplaced fracture of greater tuberosity of right humerus 07/08/2018  . On rivaroxaban therapy 07/08/2018  . Chronic anticoagulation 05/27/2018  . Falls frequently 05/27/2018  . History of DVT (deep vein thrombosis) 03/16/2018  . History of pulmonary embolism 03/10/2018  . Left lower quadrant abdominal pain 03/10/2018  . Chronic pain syndrome 03/05/2018  . COPD exacerbation ([place]) 06/09/2016  . Abnormal urine odor 09/26/2015  . Recurrent  pulmonary embolism ([place]) 06/26/2015  . Lupus anticoagulant positive 06/26/2015  . Thrombocytopenia ([place]) 05/31/2015  . UTI (lower urinary tract infection) 05/26/2015  . Right flank pain   . Patchy loss of hair 03/08/2015  . Primary osteoarthritis of both knees 02/25/2015  . Incarcerated incisional hernia s/p lap repair w mesh 12/10/2014 12/10/2014  . DVT (deep venous thrombosis) ([place]) 10/31/2013  . Pulmonary embolism ([place]) 10/26/2013  . Kidney stone 10/04/2013  . Tobacco abuse 10/04/2013  .  COPD (chronic obstructive pulmonary disease) ([place])   . Unspecified constipation 12/23/2012  . CKD (chronic kidney disease), stage III ([place]) 12/21/2012  . Allergic rhinitis 06/29/2012  . Chronic low back pain 06/29/2012  . Dysuria 06/29/2012  . Edema 06/29/2012  . Gastro-esophageal reflux disease without esophagitis 06/29/2012  . Hemorrhoids 06/29/2012  . Hirsutism 06/29/2012  . Insomnia 06/29/2012  . Localized superficial swelling, mass, or lump 06/29/2012  . Urinary incontinence 06/29/2012  . Skin sensation disturbance 06/29/2012  . Shortness of breath 06/29/2012  . Pulmonary embolism and infarction ([place]) 06/29/2012  . Proteinuria 06/29/2012  . Nondependent cannabis abuse 06/29/2012  . Acquired cyst of kidney 10/24/2011  . 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 Cocaine abuse ([place]) 11/27/2010  . Depressive disorder 09/23/2009  . Ascites 09/23/2009  . Obesity 09/01/2008  . Hepatitis B virus infection 08/31/2008  . Acute hepatitis C virus infection 08/31/2008  . Iron deficiency anemia 08/31/2008  . Anxiety state 08/31/2008  . Essential hypertension 08/31/2008  . Coronary atherosclerosis 08/31/2008  . Hepatic cirrhosis ([place]) 08/31/2008    Past Surgical History:  Procedure Laterality Date  . ABDOMINAL HYSTERECTOMY    . ANKLE FRACTURE SURGERY Left   . CARPAL TUNNEL RELEASE Left   . DILATION AND CURETTAGE OF UTERUS  X 2  . FRACTURE SURGERY    . HAND SURGERY     1980's/left hand   . INSERTION OF MESH N/A 12/10/2014   Procedure: INSERTION OF MESH;  Surgeon: Michael Boston, MD;  Location: [facility];  Service: General;  Laterality: N/A;  . LAPAROSCOPIC ASSISTED VENTRAL HERNIA REPAIR N/A 12/10/2014   Procedure: LAPAROSCOPIC ASSISTED REPAIR OF INCARCERATED UMBILICAL HERNIA ;  Surgeon: Michael Boston, MD;  Location: [facility];  Service: General;  Laterality: N/A;  . TUBAL LIGATION      Allergies Amitriptyline hcl, Fentanyl, Gabapentin, Hydromorphone hcl, Cymbalta [duloxetine hcl],  Dilaudid [hydromorphone], and Neomycin-bacitracin zn-polymyx  Family History  Problem Relation Age of Onset  . Stroke Mother   . Lung cancer Father     Social History Social History   Tobacco Use  . Smoking status: Current Every Day Smoker    Packs/day: 0.50    Years: 35.00    Pack years: 17.50    Types: Cigarettes    Last attempt to quit: 10/26/2013    Years since quitting: 6.1  . Smokeless tobacco: Never Used  Substance Use Topics  . Alcohol use: No    Alcohol/week: 0.0 standard drinks    Comment: "stopped drinking  in 2004"  . Drug use: No    Types: "Crack" cocaine, Marijuana    Comment: 10/30/2013 "last crack in 2014; last marijuana in ~ 1990" drug free for last 3 years     Review of Systems  Constitutional: Positive fever/chills and weakness.  Eyes: No visual changes. ENT: No sore throat. Cardiovascular: Denies chest pain. Respiratory: Positive shortness of breath and cough.  Gastrointestinal: No abdominal pain. Positive nausea, no vomiting.  No diarrhea.  No constipation. Genitourinary: Negative for dysuria. Musculoskeletal:  Negative for back pain. Skin: Negative for rash. Neurological: Negative for headaches, focal weakness or numbness.  10-point ROS otherwise negative.  ____________________________________________   PHYSICAL EXAM:  VITAL SIGNS: Vitals:   01/05/20 0400 01/05/20 0738  BP: 124/72 134/87  Pulse: 73 84  Resp: 20 19  Temp:  98.3 F (36.8 C)  SpO2: 100% 97%    Constitutional: Drowsy but arouses to voice and having full conversation with some apparent confusion at times.  Eyes: Conjunctivae are normal. PERRL (8mm) bilaterally.  Head: Atraumatic. Nose: No congestion/rhinnorhea. Mouth/Throat: Mucous membranes are dry.  Neck: No stridor.  No meningeal signs.  Cardiovascular: Tachycardia. Good peripheral circulation. Grossly normal heart sounds.   Respiratory: Increased respiratory effort.  No retractions. Lungs with diffuse rhonchi. No  wheezing or rales.  Gastrointestinal: Soft and nontender. No distention.  Musculoskeletal: No lower extremity tenderness nor edema. No gross deformities of extremities. Neurologic:  Normal speech and language. No gross focal neurologic deficits are appreciated.  Skin:  Skin is warm, dry and intact. No rash noted.  ____________________________________________   LABS (all labs ordered are listed, but only abnormal results are displayed)  Labs Reviewed  LACTIC ACID, PLASMA - Abnormal; Notable for the following components:      Result Value   Lactic Acid, Venous 2.9 (*)    All other components within normal limits  LACTIC ACID, PLASMA - Abnormal; Notable for the following components:   Lactic Acid, Venous 2.4 (*)    All other components within normal limits  COMPREHENSIVE METABOLIC PANEL - Abnormal; Notable for the following components:   Chloride 97 (*)    Glucose, Bld 122 (*)    Creatinine, Ser 1.75 (*)    Albumin 3.4 (*)    AST 66 (*)    ALT 50 (*)    GFR, Estimated 32 (*)    All other components within normal limits  CBC WITH DIFFERENTIAL/PLATELET - Abnormal; Notable for the following components:   WBC 10.9 (*)    Hemoglobin 11.4 (*)    Platelets 146 (*)    Neutro Abs 9.5 (*)    Lymphs Abs 0.6 (*)    All other components within normal limits  PROTIME-INR - Abnormal; Notable for the following components:   Prothrombin Time 22.3 (*)    INR 2.0 (*)    All other components within normal limits  HEPATITIS PANEL, ACUTE - Abnormal; Notable for the following components:   HCV Ab Reactive (*)    All other components within normal limits  HEPARIN LEVEL (UNFRACTIONATED) - Abnormal; Notable for the following components:   Heparin Unfractionated 1.14 (*)    All other components within normal limits  APTT - Abnormal; Notable for the following components:   aPTT 79 (*)    All other components within normal limits  CBC - Abnormal; Notable for the following components:   RBC 3.80 (*)     Hemoglobin 10.3 (*)    HCT 33.2 (*)    Platelets 109 (*)    All other components within normal limits  COMPREHENSIVE METABOLIC PANEL - Abnormal; Notable for the following components:   Glucose, Bld 106 (*)    Creatinine, Ser 1.38 (*)    Calcium 8.7 (*)    Total Protein 5.5 (*)    Albumin 2.8 (*)    GFR, Estimated 43 (*)    All other components within normal limits  CK - Abnormal; Notable for the following components:   Total CK 488 (*)    All other  components within normal limits  I-STAT VENOUS BLOOD GAS, ED - Abnormal; Notable for the following components:   pH, Ven 7.482 (*)    pCO2, Ven 40.3 (*)    pO2, Ven 72.0 (*)    Bicarbonate 30.2 (*)    Acid-Base Excess 6.0 (*)    Calcium, Ion 1.12 (*)    All other components within normal limits  CULTURE, BLOOD (ROUTINE X 2)  RESP PANEL BY RT-PCR (FLU A&B, COVID) ARPGX2  RESPIRATORY PANEL BY PCR  CULTURE, BLOOD (ROUTINE X 2)  URINE CULTURE  RAPID HIV SCREEN (HIV 1/2 AB+AG)  APTT  URINALYSIS, ROUTINE W REFLEX MICROSCOPIC  AMMONIA  PROCALCITONIN  CORTISOL-AM, BLOOD  PHOSPHORUS  MAGNESIUM  AMMONIA  PROCALCITONIN  HCV AB W/RFLX TO VERIFICATION  HCV RNA QUANT   ____________________________________________  EKG   EKG Interpretation  Date/Time:  Sunday January 04 2020 13:30:04 EST Ventricular Rate:  135 PR Interval:    QRS Duration: 86 QT Interval:  307 QTC Calculation: 461 R Axis:   -28 Text Interpretation: Fast sinus arrhythmia Probable left atrial enlargement Borderline left axis deviation No STEMI Confirmed by Nanda Quinton [phone]) on 01/04/2020 1:31:46 PM       ____________________________________________  RADIOLOGY  CT Head Wo Contrast  Result Date: 01/04/2020 CLINICAL DATA:  Mental status change. Code sepsis. EXAM: CT HEAD WITHOUT CONTRAST TECHNIQUE: Contiguous axial images were obtained from the base of the skull through the vertex without intravenous contrast. COMPARISON:  Head CT 07/14/2018 at [facility]: Brain: Brain volume is normal for age. No intracranial hemorrhage, mass effect, or midline shift. Mild periventricular chronic small vessel ischemia. No hydrocephalus. The basilar cisterns are patent. No evidence of territorial infarct or acute ischemia. No extra-axial or intracranial fluid collection. Vascular: No hyperdense vessel or unexpected calcification. Skull: No fracture or focal lesion. Sinuses/Orbits: Paranasal sinuses and mastoid air cells are clear. The visualized orbits are unremarkable. Other: None. IMPRESSION: 1. No acute intracranial abnormality. 2. Mild chronic small vessel ischemia. Electronically Signed   By: Keith Rake M.D.   On: 01/04/2020 15:52   DG Chest Port 1 View  Result Date: 01/04/2020 CLINICAL DATA:  Questionable sepsis. Weakness. EXAM: PORTABLE CHEST 1 VIEW COMPARISON:  Chest radiograph and CTA 4 days ago 12/31/2019 FINDINGS: Lower lung volumes from prior exam with crowding of bronchovascular structures. Cardiomegaly is stable. Aortic tortuosity and atherosclerosis. Progressive peribronchial thickening. Mild patchy opacity at the left lung base. No significant pleural effusion. No pneumothorax. Grossly stable osseous structures. IMPRESSION: 1. Patchy opacity at the left lung base, increased over the past 4 days may be atelectasis or pneumonia. 2. Progressive peribronchial thickening may be bronchitic or pulmonary edema. Stable cardiomegaly. Electronically Signed   By: Keith Rake M.D.   On: 01/04/2020 15:14    ____________________________________________   PROCEDURES  Procedure(s) performed:   .Critical Care Performed by: Margette Fast, MD Authorized by: Margette Fast, MD   Critical care provider statement:    Critical care time (minutes):  35   Critical care time was exclusive of:  Separately billable procedures and treating other patients and teaching time   Critical care was necessary to treat or prevent imminent or  life-threatening deterioration of the following conditions:  Respiratory failure and sepsis   Critical care was time spent personally by me on the following activities:  Discussions with consultants, evaluation of patient's response to treatment, examination of patient, ordering and performing treatments and interventions, ordering and review of laboratory studies, ordering  and review of radiographic studies, pulse oximetry, re-evaluation of patient's condition, obtaining history from patient or surrogate, review of old charts, blood draw for specimens and development of treatment plan with patient or surrogate   I assumed direction of critical care for this patient from another provider in my specialty: no       ____________________________________________   INITIAL IMPRESSION / [place] / ED COURSE  Pertinent labs & imaging results that were available during my care of the patient were reviewed by me and considered in my medical decision making (see chart for details).   Patient presents to the emergency department with confusion with fever, cough, congestion.  Differential is broad at this time.  She does seem somewhat confused but this is complicated by her underlying liver disease, opiate use at home, fever, and possible developing sepsis.  COVID-19 is also a consideration.  Patient is newly on oxygen here and is significantly rhonchorous.  She does not appear acutely volume overloaded to suspect CHF but I do note a history of CKD.  Patient is anticoagulated.  Plan for IV fluid bolus, labs, ammonia, chest x-ray, Covid swab, along with CT head.   CT head reviewed with no acute findings.  Chest x-ray showing likely pneumonia.  Covid test is negative.  CMP and lipase are pending.  CBC reviewed.  Care transferred to Dr. Regenia Skeeter pending labs and admit. Patient's respiratory status improved here with supplemental O2. No BiPAP or intubation required at this time.    I reviewed all  nursing notes, vitals, pertinent old records, EKGs, labs, imaging (as available).  ____________________________________________  FINAL CLINICAL IMPRESSION(S) / ED DIAGNOSES  Final diagnoses:  Severe sepsis ([place])  Community acquired pneumonia of left lower lobe of lung     MEDICATIONS GIVEN DURING THIS VISIT:  Medications  lactated ringers infusion ( Intravenous Rate/Dose Change 01/04/20 2044)  cefTRIAXone (ROCEPHIN) 2 g in sodium chloride 0.9 % 100 mL IVPB (0 g Intravenous Stopped 01/04/20 1753)  azithromycin (ZITHROMAX) 500 mg in sodium chloride 0.9 % 250 mL IVPB (0 mg Intravenous Stopped 01/04/20 1808)  acetaminophen (TYLENOL) tablet 650 mg (has no administration in time range)    Or  acetaminophen (TYLENOL) suppository 650 mg (has no administration in time range)  bisacodyl (DULCOLAX) suppository 10 mg (has no administration in time range)  ondansetron (ZOFRAN) tablet 4 mg (has no administration in time range)    Or  ondansetron (ZOFRAN) injection 4 mg (has no administration in time range)  ipratropium-albuterol (DUONEB) 0.5-2.5 (3) MG/3ML nebulizer solution 3 mL (3 mLs Nebulization Given 01/05/20 0309)  budesonide (PULMICORT) nebulizer solution 0.5 mg (0.5 mg Nebulization Not Given 01/04/20 2000)  arformoterol (BROVANA) nebulizer solution 15 mcg (15 mcg Nebulization Not Given 01/04/20 2000)  heparin ADULT infusion 100 units/mL (25000 units/26mL sodium chloride 0.45%) (1,750 Units/hr Intravenous New Bag/Given 01/05/20 0715)  ALPRAZolam (XANAX) tablet 0.5 mg (has no administration in time range)  oxyCODONE (Oxy IR/ROXICODONE) immediate release tablet 10 mg (has no administration in time range)  lactated ringers bolus 500 mL (0 mLs Intravenous Stopped 01/04/20 1545)  lactated ringers bolus 1,000 mL (0 mLs Intravenous Stopped 01/04/20 1814)  naloxone (NARCAN) injection 0.4 mg (0.4 mg Intravenous Given 01/04/20 1754)    Note:  This document was prepared using Dragon voice  recognition software and may include unintentional dictation errors.  Nanda Quinton, MD, [facility]    Fredi Hurtado, [facility], MD 01/05/20 [phone]

## 2020-01-04 NOTE — H&P (Signed)
History and Physical    [name] WUJ:[id number] DOB: Mar 29, 1955 DOA: 01/04/2020  PCP: Bernerd Limbo, MD Patient coming from: Home  Chief Complaint: Altered mental status, shortness of breath  HPI: [name] is a 64 y.o. female with history of COPD not on oxygen, chronic pain/OA on opiate, hepatitis B, DVT/PE on Xarelto, diastolic CHF, HTN, anxiety, bipolar disorder, CKD-3 and morbid obesity brought to ED by EMS due to altered mental status, cough, shortness of breath, congestion and fever.  On my evaluation, patient was somewhat somnolent.  Briefly wakes to voice and falls back to ssleep. Oriented to self and place but not able to stay awake to provide history.  Follows some commands.  No family at bedside.  Attempted to call daughter for collateral information but no answer.  In ED, febrile to 100.6.  HR 120>> 88.. RR 41>> 27.  BP 92/45>> 133/76. Sat 91% on RA>2L>> 98%.  WBC 10.9 with left shift.  Hgb 11.4 (baseline).  Lactic acid 2.9. Cr 1.75 (1.26 about 4 days ago).  AST 66.  ALT 50.  VBG 7.48/40/72/30.  PT/INR 22/2.0.  COVID-19 and influenza PCR nonreactive.  EKG sinus tachycardia without acute ischemic finding.  Portable CXR with patchy opacity at left lung base and progressive peribronchial thickening.  CT head without acute finding.  Cultures obtained.  Received LR bolus 1.5 L and started on maintenance at 150 cc an hour.  Started on ceftriaxone, azithromycin.  Hospitalist service called for admission for possible sepsis due to pneumonia.  ROS Not able to conduct review of system due to patient's mental status. PMH Past Medical History:  Diagnosis Date  . Agoraphobia with panic attacks   . Altered mental state 10/26/2013  . Ankle fracture, left 11/27/2010   S/p ORIF 11/2   . Anxiety   . Arthritis    "knees; lower back" (10/30/2013)  . Bipolar disorder ([place])   . Cavitary pneumonia   . Cirrhosis ([place])   . CKD (chronic kidney disease), stage III ([place])   . COPD (chronic  obstructive pulmonary disease) ([place])   . Depression   . DVT (deep venous thrombosis) ([place]) 09/2013   RLE  . GERD (gastroesophageal reflux disease)   . Heart murmur   . Hepatitis B   . History of blood transfusion    "due to excessive blood loss before hysterectomy"  . History of urinary tract infection   . Hypertension    currently on no medication   . Kidney stones   . Pneumonia    "4 times in the past year" (10/30/2013)  . Pulmonary emboli ([place]) 09/2013  . Shortness of breath dyspnea    increased exertion;exercise  . Urinary frequency   . Urinary incontinence    PSH Past Surgical History:  Procedure Laterality Date  . ABDOMINAL HYSTERECTOMY    . ANKLE FRACTURE SURGERY Left   . CARPAL TUNNEL RELEASE Left   . DILATION AND CURETTAGE OF UTERUS  X 2  . FRACTURE SURGERY    . HAND SURGERY     1980's/left hand   . INSERTION OF MESH N/A 12/10/2014   Procedure: INSERTION OF MESH;  Surgeon: Michael Boston, MD;  Location: [facility];  Service: General;  Laterality: N/A;  . LAPAROSCOPIC ASSISTED VENTRAL HERNIA REPAIR N/A 12/10/2014   Procedure: LAPAROSCOPIC ASSISTED REPAIR OF INCARCERATED UMBILICAL HERNIA ;  Surgeon: Michael Boston, MD;  Location: [facility];  Service: General;  Laterality: N/A;  . TUBAL LIGATION     Fam HX Family  History  Problem Relation Age of Onset  . Stroke Mother   . Lung cancer Father     Social Hx  reports that she has been smoking cigarettes. She has a 17.50 pack-year smoking history. She has never used smokeless tobacco. She reports that she does not drink alcohol and does not use drugs.  Allergy Allergies  Allergen Reactions  . 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 Amitriptyline Hcl Other (See Comments)     Causes her to be very "disoriented".  . Fentanyl Other (See Comments)    Sees things Pt stated had hallucinations "d/t taking high dosage, but can tolerate low dosage"   . Gabapentin Rash and Other (See Comments)    REACTION:  rash in mouth   . Hydromorphone Hcl Nausea And Vomiting  . Cymbalta [Duloxetine Hcl] Other (See Comments)    headaches  . Dilaudid [Hydromorphone] Nausea And Vomiting  . Neomycin-Bacitracin Zn-Polymyx Rash   Home Meds Prior to Admission medications   Medication Sig Start Date End Date Taking? Authorizing Provider  albuterol (PROAIR HFA) 108 (90 Base) MCG/ACT inhaler INHALE 2 PUFFS INTO THE LUNGS EVERY 4 HOURS AS NEEDED FOR WHEEZING 12/04/18  Yes Rigoberto Noel, MD  albuterol (PROVENTIL) (2.5 MG/3ML) 0.083% nebulizer solution Take 3 mLs (2.5 mg total) by nebulization every 4 (four) hours as needed for wheezing or shortness of breath. 12/04/18  Yes Rigoberto Noel, MD  ALPRAZolam Duanne Moron) 1 MG tablet Take 0.5 tablets (0.5 mg total) by mouth 2 (two) times daily as needed for anxiety. Patient taking differently: Take 1 mg by mouth 3 (three) times daily as needed for anxiety. 06/02/15  Yes Elgergawy, Silver Huguenin, MD  amLODipine (NORVASC) 5 MG tablet Take 5 mg by mouth daily. 12/21/19  Yes [provider]  ascorbic acid (VITAMIN C) 500 MG tablet Take by mouth.   Yes [provider]  baclofen (LIORESAL) 10 MG tablet Take 10 mg by mouth 3 (three) times daily. 06/09/19  Yes [provider]  bisacodyl (DULCOLAX) 5 MG EC tablet Take 5 mg by mouth once.   Yes [provider]  budesonide-formoterol (SYMBICORT) 160-4.5 MCG/ACT inhaler Inhale 2 puffs into the lungs 2 (two) times daily. 12/04/18 12/04/19 Yes Rigoberto Noel, MD  Docusate Sodium (DSS) 100 MG CAPS Take by mouth. 07/08/19  Yes [provider]  doxepin (SINEQUAN) 25 MG capsule Take 25 mg by mouth at bedtime as needed. 01/30/19  Yes [provider]  entecavir (BARACLUDE) 0.5 MG tablet Take by mouth. 11/25/19  Yes [provider]  ergocalciferol (VITAMIN D2) 1.25 MG (50000 UT) capsule Take by mouth. 09/27/19  Yes [provider]  famotidine (PEPCID) 20 MG tablet Take 20 mg by mouth 2 (two)  times daily. 10/28/19  Yes [provider]  FANAPT 2 MG TABS Take by mouth. 12/29/19  Yes [provider]  ferrous sulfate 325 (65 FE) MG tablet Take by mouth.   Yes [provider]  fluconazole (DIFLUCAN) 150 MG tablet Take by mouth. 07/10/19  Yes [provider]  furosemide (LASIX) 20 MG tablet Take one tablet twice daily for 3 days then one tablet daily after that until gone. 11/01/19  Yes Upstill, Nehemiah Settle, PA-C  guaiFENesin (MUCINEX) 600 MG 12 hr tablet Take 1 tablet (600 mg total) by mouth 2 (two) times daily as needed. 12/31/19  Yes Maudie Flakes, MD  ibuprofen (ADVIL,MOTRIN) 800 MG tablet Take 1 tablet (800 mg total)  by mouth 3 (three) times daily. 11/12/15  Yes de Villier, Daryl F II, PA  levocetirizine (XYZAL) 5 MG tablet Take 5 mg by mouth at bedtime. 12/02/19  Yes [provider]  Magnesium 100 MG CAPS Take 100 mg by mouth daily as needed (nerve pain).   Yes [provider]  Multiple Vitamins-Minerals (MULTIVITAMIN WOMEN 50+) TABS Take 1 tablet by mouth daily.   Yes [provider]  naloxone (NARCAN) 4 MG/0.1ML LIQD nasal spray kit FOR SUSPECTED OVERDOSE, INSTILL THE CONTENTS OF NASAL SPRAY INTO ONE NOSTRIL AS A SINGLE DOSE. MAY REPEAT DOSE IN 2 TO 3 MINUTES IN ALTERNAT 07/01/19  Yes [provider]  ondansetron (ZOFRAN-ODT) 8 MG disintegrating tablet Take 8 mg by mouth every 8 (eight) hours as needed. 09/18/19  Yes [provider]  Oxycodone HCl 10 MG TABS Take 10 mg by mouth daily. 10/19/18  Yes [provider]  pantoprazole (PROTONIX) 40 MG tablet Take 40 mg by mouth daily. 12/23/13  Yes [provider]  polyethylene glycol powder (GLYCOLAX/MIRALAX) 17 GM/SCOOP powder Take by mouth. 07/08/19  Yes [provider]  Potassium 99 MG TABS Take 99 mg by mouth daily.   Yes [provider]  pregabalin (LYRICA) 75 MG capsule Take 75 mg by mouth daily. 10/09/19  Yes [provider]   RELISTOR 150 MG TABS Take 3 tablets by mouth daily. 12/15/19  Yes [provider]  rivaroxaban (XARELTO) 20 MG TABS tablet Take 20 mg by mouth daily.   Yes [provider]  spironolactone (ALDACTONE) 50 MG tablet Take 50 mg by mouth once. 08/14/17  Yes [provider]  sucralfate (CARAFATE) 1 g tablet 1 gram four times daily 07/22/19  Yes [provider]  valACYclovir (VALTREX) 500 MG tablet Take 500 mg by mouth daily as needed (outbreaks). 10/22/19  Yes [provider]  dicyclomine (BENTYL) 20 MG tablet Take by mouth. 08/07/19   [provider]  enoxaparin (LOVENOX) 120 MG/0.8ML SOLN Inject 0.8 mLs (120 mg total) into the skin every 12 (twelve) hours. 11/30/10 04/16/11  Jenelle Mages, MD  loratadine (CLARITIN) 10 MG tablet Take 10 mg by mouth daily.    04/16/11  [provider]    Physical Exam: Vitals:   01/04/20 1640 01/04/20 1650 01/04/20 1700 01/04/20 1710  BP: 132/83 128/74 118/77 133/76  Pulse: (!) 102 88 88 88  Resp: (!) 27 (!) 25 (!) 24 (!) 27  Temp:      TempSrc:      SpO2: 97% 97% 97% 98%  Weight:      Height:        GENERAL: No acute distress.  Appears well.  HEENT: MMM.  Vision and hearing grossly intact.  NECK: Supple.  No apparent JVD.  RESP: 98% on 2 L.  No IWOB.  Rhonchi bilaterally. CVS:  RRR. Heart sounds normal.  ABD/GI/GU: Bowel sounds present. Soft. Non tender.  MSK/EXT:  Moves extremities. No apparent deformity or edema.  SKIN: no apparent skin lesion or wound NEURO: Somnolent.  Briefly wakes to voice and falls back to sleep.  Oriented to self and place only.  Follows some commands.  Pinpoint pupils slightly reactive to light.  No facial asymmetry.  Patellar reflex symmetric.  Grip strength symmetric. PSYCH: Calm.  No distress or agitation.  Personally Reviewed Radiological Exams CT Head Wo Contrast  Result Date: 01/04/2020 CLINICAL DATA:  Mental status change. Code sepsis. EXAM: CT  HEAD WITHOUT CONTRAST TECHNIQUE: Contiguous axial images  were obtained from the base of the skull through the vertex without intravenous contrast. COMPARISON:  Head CT 07/14/2018 at [place] [facility]: Brain: Brain volume is normal for age. No intracranial hemorrhage, mass effect, or midline shift. Mild periventricular chronic small vessel ischemia. No hydrocephalus. The basilar cisterns are patent. No evidence of territorial infarct or acute ischemia. No extra-axial or intracranial fluid collection. Vascular: No hyperdense vessel or unexpected calcification. Skull: No fracture or focal lesion. Sinuses/Orbits: Paranasal sinuses and mastoid air cells are clear. The visualized orbits are unremarkable. Other: None. IMPRESSION: 1. No acute intracranial abnormality. 2. Mild chronic small vessel ischemia. Electronically Signed   By: Keith Rake M.D.   On: 01/04/2020 15:52   DG Chest Port 1 View  Result Date: 01/04/2020 CLINICAL DATA:  Questionable sepsis. Weakness. EXAM: PORTABLE CHEST 1 VIEW COMPARISON:  Chest radiograph and CTA 4 days ago 12/31/2019 FINDINGS: Lower lung volumes from prior exam with crowding of bronchovascular structures. Cardiomegaly is stable. Aortic tortuosity and atherosclerosis. Progressive peribronchial thickening. Mild patchy opacity at the left lung base. No significant pleural effusion. No pneumothorax. Grossly stable osseous structures. IMPRESSION: 1. Patchy opacity at the left lung base, increased over the past 4 days may be atelectasis or pneumonia. 2. Progressive peribronchial thickening may be bronchitic or pulmonary edema. Stable cardiomegaly. Electronically Signed   By: Keith Rake M.D.   On: 01/04/2020 15:14     Personally Reviewed Labs: CBC: Recent Labs  Lab 12/31/19 0131 01/04/20 1400 01/04/20 1416  WBC 6.2 10.9*  --   NEUTROABS  --  9.5*  --   HGB 11.5* 11.4* 12.2  HCT 37.7 36.2 36.0  MCV 87.9 86.4  --   PLT 134* 146*  --    Basic Metabolic  Panel: Recent Labs  Lab 12/31/19 0131 01/04/20 1400 01/04/20 1416  NA 138 137 135  K 3.6 4.0 3.9  CL 104 97*  --   CO2 23 25  --   GLUCOSE 188* 122*  --   BUN 15 17  --   CREATININE 1.28* 1.75*  --   CALCIUM 9.4 9.1  --    GFR: Estimated Creatinine Clearance: 39.9 mL/min (A) (by C-G formula based on SCr of 1.75 mg/dL (H)). Liver Function Tests: Recent Labs  Lab 01/04/20 1400  AST 66*  ALT 50*  ALKPHOS 101  BILITOT 0.7  PROT 6.6  ALBUMIN 3.4*   No results for input(s): LIPASE, AMYLASE in the last 168 hours. Recent Labs  Lab 01/04/20 1400  AMMONIA 20   Coagulation Profile: Recent Labs  Lab 01/04/20 1400  INR 2.0*   Cardiac Enzymes: No results for input(s): CKTOTAL, CKMB, CKMBINDEX, TROPONINI in the last 168 hours. BNP (last 3 results) No results for input(s): PROBNP in the last 8760 hours. HbA1C: No results for input(s): HGBA1C in the last 72 hours. CBG: No results for input(s): GLUCAP in the last 168 hours. Lipid Profile: No results for input(s): CHOL, HDL, LDLCALC, TRIG, CHOLHDL, LDLDIRECT in the last 72 hours. Thyroid Function Tests: No results for input(s): TSH, T4TOTAL, FREET4, T3FREE, THYROIDAB in the last 72 hours. Anemia Panel: No results for input(s): VITAMINB12, FOLATE, FERRITIN, TIBC, IRON, RETICCTPCT in the last 72 hours. Urine analysis:    Component Value Date/Time   COLORURINE STRAW (A) 12/31/2019 1105   APPEARANCEUR CLEAR 12/31/2019 1105   LABSPEC 1.005 12/31/2019 1105   PHURINE 6.0 12/31/2019 1105   GLUCOSEU NEGATIVE 12/31/2019 1105   HGBUR NEGATIVE 12/31/2019 1105   BILIRUBINUR NEGATIVE 12/31/2019  [place] 12/31/2019 [place] 12/31/2019 1105   UROBILINOGEN 1.0 04/03/2014 1645   NITRITE NEGATIVE 12/31/2019 1105   LEUKOCYTESUR NEGATIVE 12/31/2019 1105    Sepsis Labs:  Lactic acid 2.9.  Personally Reviewed EKG:  Twelve-lead EKG with sinus tachycardia.  Assessment/Plan Severe sepsis due to  possible aspiration pneumonia: POA.  -Meets criteria with fever, tachycardia, tachypnea, lactic acidosis, AKI and encephalopathy. -CXR with left lingula and left base opacity -Check procalcitonin -Continue ceftriaxone and azithromycin -Continue IV fluid -Trend lactic acid. -Follow blood cultures.  Septic encephalopathy: Likely due to the above.  No focal neuro deficit but difficult exam.  She also seems to have pinpoint pupil concerning for opiates.  She is on oxycodone for chronic back pain.  CT head without acute finding. -IV Narcan x1 -Treat sepsis as above. -N.p.o. pending SLP eval and improvement in mental status -Fall and aspiration precautions -Consider MRI brain if no improvement in mental status.  AKI on CKD-3A: Likely due to sepsis.  She might be taking NSAIDs as well. -Avoid nephrotoxic meds -IV fluid as above -Monitor urine output  Elevated liver enzymes: Could be due to sepsis or hepatitis B. -Recheck in the morning  Chronic COPD: VBG not consistent with COPD exacerbation although she has symptoms -Budesonide and Brovana -As needed DuoNeb -Antibiotic as above.  History of PE/DVT-on Xarelto at home. -Heparin per pharmacy while n.p.o.  Essential hypertension: Was slightly hypotensive on arrival but responded to IV fluid. -Hold antihypertensive meds -Continue IV fluid  Chronic diastolic CHF/pulmonary hypertension: TTE in 2017 with LVEF of 55 to 60%, G1 DD and PA PP of 47 mmHg.  Does not appear fluid overloaded. -Hold home diuretics -Monitor fluid status while on IV fluid  Chronic pain syndrome/osteoarthritis-since she is on oxycodone per med list but not reviewed. -As needed rectal Tylenol while n.p.o. -Hold opiates, Lyrica and baclofen  Anxiety/bipolar disorder: Xanax  Hepatitis B infection-Entecavir on her med list.  -Continue after med review  GERD: -IV PPI while n.p.o.  DVT prophylaxis: On full dose heparin  Code Status: Full code by default Family  Communication: Attempted to call patient's daughter but no answer.  Did not leave voicemail on generic voicemail.  Disposition Plan: Admit to progressive unit Consults called: None Admission status: Inpatient.  Critically sick patient with severe sepsis with multiple endorgan damage including AKI, lactic acidosis, transaminitis hypotension and septic encephalopathy   Mercy Riding MD [facility]  If 7PM-7AM, please contact night-coverage [email or web address]  01/04/2020, 5:37 PM

## 2020-01-04 NOTE — Progress Notes (Signed)
Patient arrived to [facility] from ED. Patient is somnolent and responds to voice and touch. Per ED RN patein had not had output in 6hours after multiple boluses and continuous LR infusion. Bladder scan performed showed 647. In and out cath performed obtained 1100cc of clear yellow urine. Urine culture sent to lab. CCMD notified. Mews yellow. Mews protocol initiated. Will continue to monitor patient.

## 2020-01-04 NOTE — Progress Notes (Signed)
Pharmacy Heparin Induced Thrombocytopenia (HIT) Note:  [name] is an 64 y.o. female who was evaluated for HIT back in 2015.   HIT labs were ordered on 11/06/2013.   A. HIT antibody result available: Positive (11/06/2013)   B. SRA result availability: NEGATIVE (11/06/2013)  Plan: Will d/c heparin allergy from profile as SRA negative. D/w rounding [facility].   Sherlon Handing, PharmD, BCPS Please see amion for complete clinical pharmacist phone list 01/04/2020, 10:47 PM

## 2020-01-04 NOTE — ED Notes (Signed)
Called lab to see what the delay is on results for pt labs that were collected and sent at 1400.

## 2020-01-04 NOTE — ED Provider Notes (Signed)
Patient care transferred to me. Patient with pneumonia and mild hypoxia. Found to have lactic of 2.9 and acute kidney injury. Will give another fluid bolus. Will need admission.   Sherwood Gambler, MD 01/04/20 [phone]

## 2020-01-04 NOTE — ED Notes (Signed)
Lab reports the reason the labs are delayed is d/t they are busy. Explained that blood was sent at 1400 and that some are resulted and everything was sent down together. Also explained that pt is Code Sepsis and blood needs to be priority. Notified provider.

## 2020-01-04 NOTE — Progress Notes (Signed)
ANTICOAGULATION CONSULT NOTE - Initial Consult  Pharmacy Consult for heparin Indication: VTE treatment  Allergies  Allergen Reactions  . 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 Amitriptyline Hcl Other (See Comments)     Causes her to be very "disoriented".  . Fentanyl Other (See Comments)    Sees things Pt stated had hallucinations "d/t taking high dosage, but can tolerate low dosage"   . Gabapentin Rash and Other (See Comments)    REACTION: rash in mouth   . Hydromorphone Hcl Nausea And Vomiting  . Cymbalta [Duloxetine Hcl] Other (See Comments)    headaches  . Dilaudid [Hydromorphone] Nausea And Vomiting  . Neomycin-Bacitracin Zn-Polymyx Rash    Patient Measurements: Height: 5\' 4"  (162.6 cm) Weight: 110 kg (242 lb 8.1 oz) IBW/kg (Calculated) : 54.7 Heparin Dosing Weight: 110 kg   Vital Signs: Temp: 100.6 F (38.1 C) (12/12 1347) Temp Source: Oral (12/12 1347) BP: 133/76 (12/12 1710) Pulse Rate: 88 (12/12 1710)  Labs: Recent Labs    01/04/20 1400 01/04/20 1416  HGB 11.4* 12.2  HCT 36.2 36.0  PLT 146*  --   APTT 36  --   LABPROT 22.3*  --   INR 2.0*  --   CREATININE 1.75*  --     Estimated Creatinine Clearance: 39.9 mL/min (A) (by C-G formula based on SCr of 1.75 mg/dL (H)).   Medical History: Past Medical History:  Diagnosis Date  . Agoraphobia with panic attacks   . Altered mental state 10/26/2013  . Ankle fracture, left 11/27/2010   S/p ORIF 11/2   . Anxiety   . Arthritis    "knees; lower back" (10/30/2013)  . Bipolar disorder ([place])   . Cavitary pneumonia   . Cirrhosis ([place])   . CKD (chronic kidney disease), stage III ([place])   . COPD (chronic obstructive pulmonary disease) ([place])   . Depression   . DVT (deep venous thrombosis) ([place]) 09/2013   RLE  . GERD (gastroesophageal reflux disease)   . Heart murmur   . Hepatitis B   . History of blood transfusion    "due to excessive blood loss before hysterectomy"  . History of  urinary tract infection   . Hypertension    currently on no medication   . Kidney stones   . Pneumonia    "4 times in the past year" (10/30/2013)  . Pulmonary emboli ([place]) 09/2013  . Shortness of breath dyspnea    increased exertion;exercise  . Urinary frequency   . Urinary incontinence     Medications:  (Not in a hospital admission)   Assessment: 41 YOF on Xarelto PTA for h/o DVT/PE. Pharmacy consulted to transition to IV heparin. Of note, last dose of Xarelto was yesterday  H/H wnl, Plt low. SCr 1.75.   Of note, patient chart has a listed allergy of HIT to heparin but per documentation, a serotonin release assay was negative.   Goal of Therapy:  Heparin level 0.3-0.7 units/ml aPTT 66-102 seconds Monitor platelets by anticoagulation protocol: Yes   Plan:  -Heparin 1750 units/hr. No bolus -F/u 6 hr aPTT/HL -Monitor daily HL, aPTT, CBC and s/s of bleeding  Albertina Parr, PharmD., BCPS, BCCCP Clinical Pharmacist Please refer to [facility] for unit-specific pharmacist

## 2020-01-04 NOTE — ED Notes (Signed)
Lactic Acid 2.9, notified Regenia Skeeter MD

## 2020-01-04 NOTE — ED Notes (Signed)
Admitting provider at bedside.

## 2020-01-04 NOTE — ED Triage Notes (Signed)
Pt arrived to ED from home via [facility] as a Code Sepsis. EMS report pt said she wasn't feeling good for 24 hrs. Also reports that pt took [place] at 2330 which is a new prescription and that pt has been feeling loopy, tired and weak since. Pt denies taking any other of her meds that can cause sedation including her pain medication. Pt pupils are pinpoint. EMS reports pt A&Ox4. Upon assessment pt is drowsy and oriented to person, place, situation. Pt has hx of COPD, chronic bronchitis, Hep B. Pt is on xarelto for DVT and reports that she may have fallen yesterday but isn't sure. EMS activated Code Sepsis at 1305.  EMS VS: 99.5, HR 100-165 ST, BP 140/70, ETCO2 15-35, RR 30-40, RA O2 92% then 97% on 4L [place] Access: 20g L hand, 20g L FA, 224mL NS given by EMS.

## 2020-01-04 NOTE — ED Notes (Signed)
Pts daughter at bedside  

## 2020-01-05 ENCOUNTER — Ambulatory Visit: Payer: Medicare Other | Admitting: Physician Assistant

## 2020-01-05 DIAGNOSIS — G8929 Other chronic pain: Secondary | ICD-10-CM

## 2020-01-05 DIAGNOSIS — R0602 Shortness of breath: Secondary | ICD-10-CM

## 2020-01-05 LAB — MAGNESIUM: Magnesium: 2.2 mg/dL (ref 1.7–2.4)

## 2020-01-05 LAB — HCV RNA QUANT: HCV Quantitative: NOT DETECTED IU/mL (ref >50)

## 2020-01-05 LAB — URINE CULTURE: Culture: NO GROWTH

## 2020-01-05 LAB — COMPREHENSIVE METABOLIC PANEL
ALT: 39 U/L (ref 0–44)
AST: 41 U/L (ref 15–41)
Albumin: 2.8 g/dL — ABNORMAL LOW (ref 3.5–5.0)
Alkaline Phosphatase: 89 U/L (ref 38–126)
Anion gap: 12 (ref 5–15)
BUN: 16 mg/dL (ref 8–23)
CO2: 27 mmol/L (ref 22–32)
Calcium: 8.7 mg/dL — ABNORMAL LOW (ref 8.9–10.3)
Chloride: 99 mmol/L (ref 98–111)
Creatinine, Ser: 1.38 mg/dL — ABNORMAL HIGH (ref 0.44–1.00)
GFR, Estimated: 43 mL/min — ABNORMAL LOW (ref >60)
Glucose, Bld: 106 mg/dL — ABNORMAL HIGH (ref 70–99)
Potassium: 3.7 mmol/L (ref 3.5–5.1)
Sodium: 138 mmol/L (ref 135–145)
Total Bilirubin: 0.6 mg/dL (ref 0.3–1.2)
Total Protein: 5.5 g/dL — ABNORMAL LOW (ref 6.5–8.1)

## 2020-01-05 LAB — PROCALCITONIN: Procalcitonin: 14.81 ng/mL

## 2020-01-05 LAB — CBC
HCT: 33.2 % — ABNORMAL LOW (ref 36.0–46.0)
Hemoglobin: 10.3 g/dL — ABNORMAL LOW (ref 12.0–15.0)
MCH: 27.1 pg (ref 26.0–34.0)
MCHC: 31 g/dL (ref 30.0–36.0)
MCV: 87.4 fL (ref 80.0–100.0)
Platelets: 109 10*3/uL — ABNORMAL LOW (ref 150–400)
RBC: 3.8 MIL/uL — ABNORMAL LOW (ref 3.87–5.11)
RDW: 15.4 % (ref 11.5–15.5)
WBC: 10.1 10*3/uL (ref 4.0–10.5)
nRBC: 0 % (ref 0.0–0.2)

## 2020-01-05 LAB — PHOSPHORUS: Phosphorus: 3.5 mg/dL (ref 2.5–4.6)

## 2020-01-05 LAB — HEPARIN LEVEL (UNFRACTIONATED): Heparin Unfractionated: 1.14 IU/mL — ABNORMAL HIGH (ref 0.30–0.70)

## 2020-01-05 LAB — AMMONIA: Ammonia: 17 umol/L (ref 9–35)

## 2020-01-05 LAB — CORTISOL-AM, BLOOD: Cortisol - AM: 10.4 ug/dL (ref 6.7–22.6)

## 2020-01-05 LAB — APTT: aPTT: 79 seconds — ABNORMAL HIGH (ref 24–36)

## 2020-01-05 LAB — CK: Total CK: 488 U/L — ABNORMAL HIGH (ref 38–234)

## 2020-01-05 MED ORDER — PREGABALIN 75 MG PO CAPS
75.0000 mg | ORAL_CAPSULE | Freq: Three times a day (TID) | ORAL | Status: DC
  Administered 2020-01-05 – 2020-01-08 (×9): 75 mg via ORAL
  Filled 2020-01-05 (×10): qty 1

## 2020-01-05 MED ORDER — SODIUM CHLORIDE 0.9 % IV SOLN
3.0000 g | Freq: Three times a day (TID) | INTRAVENOUS | Status: DC
  Administered 2020-01-05 – 2020-01-07 (×6): 3 g via INTRAVENOUS
  Filled 2020-01-05: qty 3
  Filled 2020-01-05: qty 8
  Filled 2020-01-05: qty 3
  Filled 2020-01-05 (×6): qty 8

## 2020-01-05 MED ORDER — ALBUTEROL SULFATE HFA 108 (90 BASE) MCG/ACT IN AERS: 2.0000 | INHALATION_SPRAY | RESPIRATORY_TRACT | Status: DC | PRN

## 2020-01-05 MED ORDER — ENTECAVIR 0.5 MG PO TABS
0.5000 mg | ORAL_TABLET | ORAL | Status: DC
  Administered 2020-01-05 – 2020-01-07 (×2): 0.5 mg via ORAL
  Filled 2020-01-05 (×2): qty 1

## 2020-01-05 MED ORDER — PANTOPRAZOLE SODIUM 40 MG PO TBEC
40.0000 mg | DELAYED_RELEASE_TABLET | Freq: Two times a day (BID) | ORAL | Status: DC
  Administered 2020-01-05 – 2020-01-08 (×6): 40 mg via ORAL
  Filled 2020-01-05 (×7): qty 1

## 2020-01-05 MED ORDER — ALPRAZOLAM 0.5 MG PO TABS
0.5000 mg | ORAL_TABLET | Freq: Two times a day (BID) | ORAL | Status: DC | PRN
  Administered 2020-01-05 – 2020-01-06 (×3): 0.5 mg via ORAL
  Filled 2020-01-05 (×3): qty 1

## 2020-01-05 MED ORDER — RIVAROXABAN 20 MG PO TABS
20.0000 mg | ORAL_TABLET | Freq: Every day | ORAL | Status: DC
  Administered 2020-01-05 – 2020-01-07 (×3): 20 mg via ORAL
  Filled 2020-01-05 (×3): qty 1

## 2020-01-05 MED ORDER — OXYCODONE HCL 5 MG PO TABS
10.0000 mg | ORAL_TABLET | Freq: Four times a day (QID) | ORAL | Status: DC | PRN
  Administered 2020-01-05 – 2020-01-08 (×10): 10 mg via ORAL
  Filled 2020-01-05 (×12): qty 2

## 2020-01-05 MED ORDER — METHYLNALTREXONE BROMIDE 150 MG PO TABS
450.0000 mg | ORAL_TABLET | Freq: Every day | ORAL | Status: DC
  Administered 2020-01-05 – 2020-01-08 (×4): 450 mg via ORAL
  Filled 2020-01-05 (×5): qty 3

## 2020-01-05 MED ORDER — AMLODIPINE BESYLATE 5 MG PO TABS
5.0000 mg | ORAL_TABLET | Freq: Every day | ORAL | Status: DC
  Administered 2020-01-05 – 2020-01-08 (×4): 5 mg via ORAL
  Filled 2020-01-05 (×4): qty 1

## 2020-01-05 MED ORDER — BISACODYL 5 MG PO TBEC
10.0000 mg | DELAYED_RELEASE_TABLET | Freq: Every day | ORAL | Status: DC | PRN
  Administered 2020-01-06 – 2020-01-07 (×2): 10 mg via ORAL
  Filled 2020-01-05 (×2): qty 2

## 2020-01-05 MED ORDER — ALBUTEROL SULFATE (2.5 MG/3ML) 0.083% IN NEBU
2.5000 mg | INHALATION_SOLUTION | RESPIRATORY_TRACT | Status: DC | PRN
  Administered 2020-01-05 – 2020-01-06 (×3): 2.5 mg via RESPIRATORY_TRACT
  Filled 2020-01-05 (×3): qty 3

## 2020-01-05 MED ORDER — VITAMIN D 25 MCG (1000 UNIT) PO TABS
2000.0000 [IU] | ORAL_TABLET | Freq: Every day | ORAL | Status: DC
  Administered 2020-01-05 – 2020-01-08 (×4): 2000 [IU] via ORAL
  Filled 2020-01-05 (×4): qty 2

## 2020-01-05 MED ORDER — BACLOFEN 10 MG PO TABS
20.0000 mg | ORAL_TABLET | Freq: Every day | ORAL | Status: DC
  Administered 2020-01-05 – 2020-01-07 (×3): 20 mg via ORAL
  Filled 2020-01-05 (×3): qty 2

## 2020-01-05 MED ORDER — ILOPERIDONE 2 MG PO TABS
4.0000 mg | ORAL_TABLET | Freq: Every day | ORAL | Status: DC
  Administered 2020-01-05 – 2020-01-08 (×4): 4 mg via ORAL
  Filled 2020-01-05 (×5): qty 2

## 2020-01-05 MED ORDER — FAMOTIDINE 20 MG PO TABS
20.0000 mg | ORAL_TABLET | Freq: Two times a day (BID) | ORAL | Status: DC
  Administered 2020-01-05 – 2020-01-08 (×8): 20 mg via ORAL
  Filled 2020-01-05 (×8): qty 1

## 2020-01-05 NOTE — Evaluation (Signed)
Physical Therapy Evaluation Patient Details Name: [name] MRN: [id number] DOB: 1955/11/22 Today's Date: 01/05/2020   History of Present Illness  64 yo female presenting with AMS, shortness of breath, cough, congestion, and fever. Tested COVID-19 negative. PMH including COPD not on oxygen, chronic pain/OA on opiate, hepatitis B, DVT/PE on Xarelto, diastolic CHF, HTN, anxiety, bipolar disorder, CKD-3 and morbid obesity.  Clinical Impression  Prior to admission, pt lives with her family, uses a cane for community mobility and is independent with BADL's. Pt presents with decreased cardiopulmonary endurance and balance deficits. Ambulating 150 feet with a walker at a supervision level,SpO2 86-90% on RA. Cues provided for activity pacing and not conversing to conserve energy. Will benefit from acute PT follow up to address deficits and maximize functional independence.     Follow Up Recommendations No PT follow up;Supervision for mobility/OOB    Equipment Recommendations  Rolling walker with 5" wheels    Recommendations for Other Services       Precautions / Restrictions Precautions Precautions: Fall Restrictions Weight Bearing Restrictions: No      Mobility  Bed Mobility Overal bed mobility: Modified Independent             General bed mobility comments: OOB in chair    Transfers Overall transfer level: Needs assistance Equipment used: None Transfers: Sit to/from Stand Sit to Stand: Supervision         General transfer comment: Supervision for safety  Ambulation/Gait Ambulation/Gait assistance: Supervision;Min guard Gait Distance (Feet): 150 Feet Assistive device: Rolling walker (2 wheeled);None Gait Pattern/deviations: Step-through pattern;Decreased stride length Gait velocity: decreased   General Gait Details: With no AD, pt demonstrates increased trunk flexion and reaches for additional external support, requiring min guard assist for safety. Progressing  to supervision level with use of walker  Stairs            Wheelchair Mobility    Modified Rankin (Stroke Patients Only)       Balance Overall balance assessment: Mild deficits observed, not formally tested                                           Pertinent Vitals/Pain Pain Assessment: Faces Faces Pain Scale: No hurt    Home Living Family/patient expects to be discharged to:: Private residence Living Arrangements: Children Available Help at Discharge: Family;Available PRN/intermittently Type of Home: House Home Access: Stairs to enter Entrance Stairs-Rails: Right Entrance Stairs-Number of Steps: 6 Home Layout: One level Home Equipment: Grab bars - tub/shower;Cane - single point      Prior Function Level of Independence: Independent         Comments: ADLs and light IADLs. transportation for to/from dr apt. Uses the cane when in community.     Hand Dominance   Dominant Hand: Right    Extremity/Trunk Assessment   Upper Extremity Assessment Upper Extremity Assessment: Overall WFL for tasks assessed    Lower Extremity Assessment Lower Extremity Assessment: Overall WFL for tasks assessed    Cervical / Trunk Assessment Cervical / Trunk Assessment: Other exceptions Cervical / Trunk Exceptions: Increased body habitus  Communication   Communication: No difficulties  Cognition Arousal/Alertness: Awake/alert Behavior During Therapy: WFL for tasks assessed/performed;Impulsive Overall Cognitive Status: No family/caregiver present to determine baseline cognitive functioning  General Comments: impulsive and anxious. Repeating that she is upset and overwhelmed since she doesnt remember being admitted to the hospital. Throughout session, pt tangiental in conversation and quickly jumping from topic to topic.Reports "she is talker."      General Comments General comments (skin integrity, edema,  etc.): SpO2 >89% on room air. HR 87-109. RR 20-30s. At end of session, replacing O2 monitor and Spo2 86% on RA; RN present and notified her has she was changing IV on the same hand.    Exercises     Assessment/Plan    PT Assessment Patient needs continued PT services  PT Problem List Decreased strength;Decreased activity tolerance;Decreased balance;Decreased mobility;Cardiopulmonary status limiting activity       PT Treatment Interventions DME instruction;Gait training;Functional mobility training;Therapeutic activities;Therapeutic exercise;Stair training;Balance training;Patient/family education    PT Goals (Current goals can be found in the Care Plan section)  Acute Rehab PT Goals Patient Stated Goal: Go home PT Goal Formulation: With patient Time For Goal Achievement: 01/19/20 Potential to Achieve Goals: Good    Frequency Min 3X/week   Barriers to discharge        Co-evaluation               AM-PAC PT "6 Clicks" Mobility  Outcome Measure Help needed turning from your back to your side while in a flat bed without using bedrails?: None Help needed moving from lying on your back to sitting on the side of a flat bed without using bedrails?: None Help needed moving to and from a bed to a chair (including a wheelchair)?: None Help needed standing up from a chair using your arms (e.g., wheelchair or bedside chair)?: None Help needed to walk in hospital room?: A Little Help needed climbing 3-5 steps with a railing? : A Little 6 Click Score: 22    End of Session   Activity Tolerance: Patient tolerated treatment well Patient left: in chair;with call bell/phone within reach;with chair alarm set Nurse Communication: Mobility status PT Visit Diagnosis: Unsteadiness on feet (R26.81);Difficulty in walking, not elsewhere classified (R26.2)    Time: 4098-1191 PT Time Calculation (min) (ACUTE ONLY): 25 min   Charges:   PT Evaluation $PT Eval Moderate Complexity: 1 Mod PT  Treatments $Therapeutic Activity: 8-22 mins        Wyona Almas, PT, DPT Acute Rehabilitation Services Pager [phone] Office [phone]   Deno Etienne 01/05/2020, 11:19 AM

## 2020-01-05 NOTE — Evaluation (Signed)
Clinical/Bedside Swallow Evaluation Patient Details  Name: [name] MRN: [id number] Date of Birth: 1955-12-18  Today's Date: 01/05/2020 Time: SLP Start Time (ACUTE ONLY): 0911 SLP Stop Time (ACUTE ONLY): 0950 SLP Time Calculation (min) (ACUTE ONLY): 39 min  Past Medical History:  Past Medical History:  Diagnosis Date  . Agoraphobia with panic attacks   . Altered mental state 10/26/2013  . Ankle fracture, left 11/27/2010   S/p ORIF 11/2   . Anxiety   . Arthritis    "knees; lower back" (10/30/2013)  . Bipolar disorder ([place])   . Cavitary pneumonia   . Cirrhosis ([place])   . CKD (chronic kidney disease), stage III ([place])   . COPD (chronic obstructive pulmonary disease) ([place])   . Depression   . DVT (deep venous thrombosis) ([place]) 09/2013   RLE  . GERD (gastroesophageal reflux disease)   . Heart murmur   . Hepatitis B   . History of blood transfusion    "due to excessive blood loss before hysterectomy"  . History of urinary tract infection   . Hypertension    currently on no medication   . Kidney stones   . Pneumonia    "4 times in the past year" (10/30/2013)  . Pulmonary emboli ([place]) 09/2013  . Shortness of breath dyspnea    increased exertion;exercise  . Urinary frequency   . Urinary incontinence    Past Surgical History:  Past Surgical History:  Procedure Laterality Date  . ABDOMINAL HYSTERECTOMY    . ANKLE FRACTURE SURGERY Left   . CARPAL TUNNEL RELEASE Left   . DILATION AND CURETTAGE OF UTERUS  X 2  . FRACTURE SURGERY    . HAND SURGERY     1980's/left hand   . INSERTION OF MESH N/A 12/10/2014   Procedure: INSERTION OF MESH;  Surgeon: Michael Boston, MD;  Location: [facility];  Service: General;  Laterality: N/A;  . LAPAROSCOPIC ASSISTED VENTRAL HERNIA REPAIR N/A 12/10/2014   Procedure: LAPAROSCOPIC ASSISTED REPAIR OF INCARCERATED UMBILICAL HERNIA ;  Surgeon: Michael Boston, MD;  Location: [facility];  Service: General;  Laterality: N/A;  . TUBAL LIGATION     HPI:  Pt is a 64  yo female  admitted with severe sepsis. CXR revealed a patchy opacity at the L lung base concerning for PNA. CTH negative. Pt had two prior swallowing evaluations in 2015 recommending regular solids and thin liquids, although with possible esophageal symptoms noted as well. PMH includes: PNA, GERD, COPD, PE, HTN, hepatitis B, DVT, CKD, cirrhosis, bipolar disorder, anxiety\   Assessment / Plan / Recommendation Clinical Impression  Pt's oropharyngeal swallow appears to be grossly functional, with rare and delayed coughs that do not appear to be overtly associated with PO intake. Pt describes a long-standing h/o esophageal and digestive issues, describing a lot of reflux in particular. Suspect that her risk for aspiration would most likely be higher post-prandially. SLP reviewed esophageal precautions, although pt is already aware of them PTA from her GI doctor. She reports that she has been following most of the precautions except for stopping food close to bedtime and sleeping propped up. She has a harder time following diet recommendations. SLP reinforced these precautions for today. No further acute SLP needs identified, but would consider additional f/u with her GI doctor if her symptoms persist. SLP Visit Diagnosis: Dysphagia, unspecified (R13.10)    Aspiration Risk  Mild aspiration risk    Diet Recommendation Regular;Thin liquid   Liquid Administration via: Cup;Straw Medication Administration: Whole meds  with liquid Supervision: Patient able to self feed;Intermittent supervision to cue for compensatory strategies Compensations: Slow rate;Small sips/bites;Follow solids with liquid Postural Changes: Remain upright for at least 30 minutes after po intake;Seated upright at 90 degrees    Other  Recommendations Oral Care Recommendations: Oral care BID   Follow up Recommendations None      Frequency and Duration            Prognosis Prognosis for Safe Diet Advancement: Good      Swallow  Study   General HPI: Pt is a 64 yo female  admitted with severe sepsis. CXR revealed a patchy opacity at the L lung base concerning for PNA. CTH negative. Pt had two prior swallowing evaluations in 2015 recommending regular solids and thin liquids, although with possible esophageal symptoms noted as well. PMH includes: PNA, GERD, COPD, PE, HTN, hepatitis B, DVT, CKD, cirrhosis, bipolar disorder, anxiety\ Type of Study: Bedside Swallow Evaluation Previous Swallow Assessment: see HPI Diet Prior to this Study: NPO Temperature Spikes Noted: Yes (100.6) Respiratory Status: Room air History of Recent Intubation: No Behavior/Cognition: Alert;Cooperative Oral Care Completed by SLP: No Oral Cavity - Dentition: Missing dentition Vision: Functional for self-feeding Self-Feeding Abilities: Able to feed self Patient Positioning: Upright in chair Baseline Vocal Quality: Normal Volitional Cough: Congested;Strong    Oral/Motor/Sensory Function Overall Oral Motor/Sensory Function:  (appears functional)   Ice Chips Ice chips: Not tested   Thin Liquid Thin Liquid: Impaired Presentation: Cup;Self Fed;Straw Pharyngeal  Phase Impairments: Cough - Delayed    Nectar Thick Nectar Thick Liquid: Not tested   Honey Thick Honey Thick Liquid: Not tested   Puree Puree: Impaired Presentation: Self Fed;Spoon   Solid     Solid: Impaired Presentation: Self Fed Pharyngeal Phase Impairments: Cough - Delayed      Osie Bond., M.A. [place] Acute Rehabilitation Services Pager [phone] Office [phone]  01/05/2020,10:02 AM

## 2020-01-05 NOTE — Progress Notes (Signed)
Explained to pt the reason she is NPO at this time. Requested Speech to see pt. Sooner than later. IV in RtFA removed d/t infiltrate. Asked pt call for assistance to the bed, bathroom and ambulation. Made pt aware of the PRN pain and anxiety medications Verbalized understand.

## 2020-01-05 NOTE — Progress Notes (Addendum)
[place] for heparin Indication: VTE treatment  Allergies  Allergen Reactions  . Amitriptyline Hcl Other (See Comments)     Causes her to be very "disoriented".  . Fentanyl Other (See Comments)    Sees things Pt stated had hallucinations "d/t taking high dosage, but can tolerate low dosage"   . Gabapentin Rash and Other (See Comments)    REACTION: rash in mouth   . Hydromorphone Hcl Nausea And Vomiting  . Cymbalta [Duloxetine Hcl] Other (See Comments)    Headaches   . Dilaudid [Hydromorphone] Nausea And Vomiting  . Neomycin-Bacitracin Zn-Polymyx Rash    Patient Measurements: Height: 5\' 4"  (162.6 cm) Weight: 117.6 kg (259 lb 4.2 oz) IBW/kg (Calculated) : 54.7 Heparin Dosing Weight: 110 kg   Vital Signs: Temp: 98.3 F (36.8 C) (12/13 0738) Temp Source: Oral (12/13 0738) BP: 134/87 (12/13 0738) Pulse Rate: 84 (12/13 0738)  Labs: Recent Labs    01/04/20 1400 01/04/20 1416 01/05/20 0626  HGB 11.4* 12.2 10.3*  HCT 36.2 36.0 33.2*  PLT 146*  --  PENDING  APTT 36  --  79*  LABPROT 22.3*  --   --   INR 2.0*  --   --   CREATININE 1.75*  --  1.38*  CKTOTAL  --   --  488*    Estimated Creatinine Clearance: 52.6 mL/min (A) (by C-G formula based on SCr of 1.38 mg/dL (H)).   Assessment: 58 YOF on Xarelto PTA for history DVT/PE.  Pharmacy consulted to transition to IV heparin.  Currently using aPTT to guide heparin dosing and aPTT is therapeutic.  No bleeding reported.  Goal of Therapy:  Heparin level 0.3-0.7 units/ml aPTT 66-102 seconds Monitor platelets by anticoagulation protocol: Yes   Plan:  Continue heparin gtt at 1750 units/hr Daily heparin level, aPTT and CBC Monitor plts closely (HIT allergy listed but SRA negative, removed allergy 12/12)  Betsie Peckman D. Mina Marble, PharmD, BCPS, [place] 01/05/2020, 8:08 AM  =======================================  Addendum: Transition patient back to Xarelto 20mg  PO daily with supper - D/C  heparin when Xarelto is given Change antibiotics to Unasyn 3gm IV Q8H for aspiration PNA Pharmacy will sign off and follow peripherally.  Thank you for the consult!  Dathan Attia D. Mina Marble, PharmD, BCPS, [place] 01/05/2020, 10:31 AM

## 2020-01-05 NOTE — Evaluation (Signed)
Occupational Therapy Evaluation Patient Details Name: [name] MRN: [id number] DOB: November 11, 1955 Today's Date: 01/05/2020    History of Present Illness 64 yo female presenting with AMS, shortness of breath, cough, congestion, and fever. Tested COVID-19 negative. PMH including COPD not on oxygen, chronic pain/OA on opiate, hepatitis B, DVT/PE on Xarelto, diastolic CHF, HTN, anxiety, bipolar disorder, CKD-3 and morbid obesity.   Clinical Impression   PTA, pt was living with her daughter, son in law, and five grandchildren and was independent with BADLs (with exception of managing socks) and using a cane for mobility outside of home. Pt currently requiring Supervision for ADLs and Supervision-Min Guard A for functional mobility. Pt presenting with decreased balance and activity tolerance. Pt with impulsivity, poor attention, and decreased safety. However, feel pt is close to her baseline for functional performance. SpO2 >89% on RA and HR 80-100s. Pt would benefit from further acute OT to facilitate safe dc. Recommend dc to home once medically stable per physician.      Follow Up Recommendations  No OT follow up    Equipment Recommendations  Tub/shower seat (bari)    Recommendations for Other Services PT consult     Precautions / Restrictions Precautions Precautions: Fall      Mobility Bed Mobility Overal bed mobility: Modified Independent                  Transfers Overall transfer level: Needs assistance   Transfers: Sit to/from Stand Sit to Stand: Supervision         General transfer comment: Supervision for safety    Balance Overall balance assessment: Mild deficits observed, not formally tested                                         ADL either performed or assessed with clinical judgement   ADL Overall ADL's : Needs assistance/impaired Eating/Feeding: NPO   Grooming: Oral care;Wash/dry face;Supervision/safety;Set up;Standing;Wash/dry  hands Grooming Details (indicate cue type and reason): Pt performing grooming at sink with Supervision. Pt completing oral care, hand hygiene, and washing her face. Upper Body Bathing: Set up;Sitting   Lower Body Bathing: Set up;Supervison/ safety;Sit to/from stand   Upper Body Dressing : Set up;Sitting   Lower Body Dressing: Minimal assistance;Sit to/from stand Lower Body Dressing Details (indicate cue type and reason): Able to bend forward in attempt to don socks; feel she could don underwear. However, unable to don socks. Requiring assistance. Reports she either doesnt wear socks or has her family help Toilet Transfer: Supervision/safety;Ambulation;Regular Toilet   Toileting- Water quality scientist and Hygiene: Supervision/safety;Sit to/from stand       Functional mobility during ADLs: Supervision/safety;Min guard General ADL Comments: Pt presenting with decreased balance, activity tolerance, and safety. Poor awareness impacting her safety. However, feel she is close to baseline functional performance     Vision Baseline Vision/History: Wears glasses Patient Visual Report: No change from baseline       Perception     Praxis      Pertinent Vitals/Pain Pain Assessment: No/denies pain     Hand Dominance Right   Extremity/Trunk Assessment Upper Extremity Assessment Upper Extremity Assessment: Overall WFL for tasks assessed   Lower Extremity Assessment Lower Extremity Assessment: Defer to PT evaluation   Cervical / Trunk Assessment Cervical / Trunk Assessment: Other exceptions Cervical / Trunk Exceptions: Increased body habitus   Communication Communication Communication: No difficulties  Cognition Arousal/Alertness: Awake/alert Behavior During Therapy: WFL for tasks assessed/performed;Impulsive Overall Cognitive Status: No family/caregiver present to determine baseline cognitive functioning                                 General Comments: impulsive  and anxious. Repeating that she is upset and overwhelmed since she doesnt remember being admitted to the hospital. Throughout session, pt tangiental in conversation and quickly jumping from topic to topic.   General Comments  SpO2 >89% on room air. HR 87-109. RR 20-30s. At end of session, replacing O2 monitor and Spo2 86% on RA; RN present and notified her has she was changing IV on the same hand.    Exercises     Shoulder Instructions      Home Living Family/patient expects to be discharged to:: Private residence Living Arrangements: Children Available Help at Discharge: Family;Available PRN/intermittently Type of Home: House Home Access: Stairs to enter [employer] of Steps: 6 Entrance Stairs-Rails: Right Home Layout: One level     Bathroom Shower/Tub: Teacher, early years/pre: Standard     Home Equipment: Grab bars - tub/shower;Cane - single point          Prior Functioning/Environment Level of Independence: Independent        Comments: ADLs abnd light IADLs. transportation for to/from dr apt. Uses the cane when in community.        OT Problem List: Decreased strength;Decreased range of motion;Impaired balance (sitting and/or standing);Decreased activity tolerance;Decreased knowledge of precautions;Decreased cognition;Decreased safety awareness;Decreased knowledge of use of DME or AE;Obesity      OT Treatment/Interventions: Self-care/ADL training;Therapeutic exercise;Energy conservation;DME and/or AE instruction;Therapeutic activities;Patient/family education    OT Goals(Current goals can be found in the care plan section) Acute Rehab OT Goals Patient Stated Goal: Go home OT Goal Formulation: With patient Time For Goal Achievement: 01/19/20 Potential to Achieve Goals: Good  OT Frequency: Min 2X/week   Barriers to D/C:            Co-evaluation              AM-PAC OT "6 Clicks" Daily Activity     Outcome Measure Help from another  person eating meals?: Total Help from another person taking care of personal grooming?: A Little Help from another person toileting, which includes using toliet, bedpan, or urinal?: A Little Help from another person bathing (including washing, rinsing, drying)?: A Little Help from another person to put on and taking off regular upper body clothing?: A Little Help from another person to put on and taking off regular lower body clothing?: A Little 6 Click Score: 16   End of Session Nurse Communication: Mobility status  Activity Tolerance: Patient tolerated treatment well Patient left: in chair;with call bell/phone within reach;with chair alarm set;with nursing/sitter in room  OT Visit Diagnosis: Unsteadiness on feet (R26.81);Other abnormalities of gait and mobility (R26.89);Muscle weakness (generalized) (M62.81)                Time: 5427-0623 OT Time Calculation (min): 41 min Charges:  OT General Charges $OT Visit: 1 Visit OT Evaluation $OT Eval Moderate Complexity: 1 Mod OT Treatments $Self Care/Home Management : 23-37 mins  [place], OTR/L Acute Rehab Pager: [phone] Office: [place] 01/05/2020, 9:41 AM

## 2020-01-05 NOTE — Progress Notes (Signed)
PROGRESS NOTE    [name]  JQB:[id number] DOB: [birth year] DOA: 01/04/2020 PCP: Bernerd Limbo, MD   Brief Narrative:  HPI On 01/04/2020 by Dr. Wendee Beavers [name] is a 64 y.o. female with history of COPD not on oxygen, chronic pain/OA on opiate, hepatitis B, DVT/PE on Xarelto, diastolic CHF, HTN, anxiety, bipolar disorder, CKD-3 and morbid obesity brought to ED by EMS due to altered mental status, cough, shortness of breath, congestion and fever.  On my evaluation, patient was somewhat somnolent.  Briefly wakes to voice and falls back to ssleep. Oriented to self and place but not able to stay awake to provide history.  Follows some commands.  No family at bedside.  Attempted to call daughter for collateral information but no answer.  In ED, febrile to 100.6.  HR 120>> 88.. RR 41>> 27.  BP 92/45>> 133/76. Sat 91% on RA>2L>> 98%.  WBC 10.9 with left shift.  Hgb 11.4 (baseline).  Lactic acid 2.9. Cr 1.75 (1.26 about 4 days ago).  AST 66.  ALT 50.  VBG 7.48/40/72/30.  PT/INR 22/2.0.  COVID-19 and influenza PCR nonreactive.  EKG sinus tachycardia without acute ischemic finding.  Portable CXR with patchy opacity at left lung base and progressive peribronchial thickening.  CT head without acute finding.  Cultures obtained.  Received LR bolus 1.5 L and started on maintenance at 150 cc an hour.  Started on ceftriaxone, azithromycin.  Hospitalist service called for admission for possible sepsis due to pneumonia. Assessment & Plan   Severe sepsis secondary to possible aspiration pneumonia -On admission, present met sepsis criteria with fever, tachycardia, tachypnea, elevated lactic acid level, acute kidney injury as well as altered mental status -Chest x-ray showed left lingula and left base opacity -Was placed on ceftriaxone and azithromycin-however if this is due to aspiration pneumonia will need to switch antibiotics to Unasyn -Blood cultures pending -Will attempt to obtain sputum culture if  possible -Patient tells me that she has followed up with gastroenterology and was told that she does aspirate especially when she lays down.  She has had an EGD and colonoscopy recently at [facility] consulted and recommended regular diet with thin liquids  Acute metabolic encephalopathy -Resolved, secondary to the above versus possible medications -Patient currently alert and oriented x3 -CT head without acute findings -Patient was also given 1 dose of IV Narcan  Acute kidney injury on chronic kidney disease, stage IIIa -Likely secondary to sepsis -Baseline creatinine approximately 1.2-1.4, was 1.75 on admission -Placed on IV fluids -Creatinine down to 1.38 -Continue t  Elevated LFTs/ Hepatitis B -Improved -Hepatitis panel shows reactive hepatitis C antibody- patient will need to follow up as an outpatient. Additional testing pending. -Continue Entecavir  Chronic diastolic heart failure/pulmonary hypertension -Echocardiogram in 2017 showed an EF of 55 to 40%, grade 1 diastolic dysfunction, PA P of 47 mmHg -Currently does not appear to be volume overloaded  -Lasix currently held  -monitor closely given the patient was started on IV fluids -Monitor intake and output, daily weights  Chronic COPD -VBG now consistent with COPD exacerbation -Continue nebulizer treatments as well as budesonide, Brovana and antibiotics as listed above  History of PE/DVT -Xarelto was initially held and patient was placed on heparin -Will restart Xarelto as patient appears to have passed her swallow eval  Essential hypertension -BP appears to be stable, will restart home medications of amlodipine -hold lasix  Chronic pain syndrome/osteoarthritis -Patient states she follows up with the pain management physician  and she is on oxycodone 10 mg 6 times daily -Will restart oxycodone 10 mg and make this 6 hours as needed -Reviewed [place] controlled substance database, patient does receive  oxycodone on a tablets monthly along with Xanax, Lyrica and most recently lunesta  -Will restart baclofen  Anxiety/bipolar disorder -Continue home meds- have restarted Xanax  GERD -Continue PPI   DVT Prophylaxis  Heparin--> xarelto  Code Status: Full  Family Communication: None at bedside  Disposition Plan:  Status is: Inpatient  Remains inpatient appropriate because:IV treatments appropriate due to intensity of illness or inability to take PO and Inpatient level of care appropriate due to severity of illness   Dispo: The patient is from: Home              Anticipated d/c is to: Home              Anticipated d/c date is: 2 days              Patient currently is not medically stable to d/c.   Consultants None  Procedures  None  Antibiotics   Anti-infectives (From admission, onward)   Start     Dose/Rate Route Frequency Ordered Stop   01/04/20 1530  cefTRIAXone (ROCEPHIN) 2 g in sodium chloride 0.9 % 100 mL IVPB        2 g 200 mL/hr over 30 Minutes Intravenous Every 24 hours 01/04/20 1524     01/04/20 1530  azithromycin (ZITHROMAX) 500 mg in sodium chloride 0.9 % 250 mL IVPB        500 mg 250 mL/hr over 60 Minutes Intravenous Every 24 hours 01/04/20 1524        Subjective:   [name] seen and examined today.  Patient states she is feeling confused.  She is not sure of what occurred yesterday.  She continues to have cough and shortness of breath.  Complains of back pain and anxiety.  Denies current chest pain, abdominal pain, nausea or vomiting, dizziness or headache.  States that she sees gastroenterology at [facility] and was told that she has a very narrow esophagus.  Objective:   Vitals:   01/05/20 0333 01/05/20 0400 01/05/20 0738 01/05/20 0945  BP:  124/72 134/87   Pulse:  73 84   Resp:  20 19   Temp:   98.3 F (36.8 C)   TempSrc:   Oral   SpO2:  100% 97% 95%  Weight: 117.6 kg     Height:        Intake/Output Summary (Last 24 hours) at 01/05/2020  0958 Last data filed at 01/05/2020 0941 Gross per 24 hour  Intake 4015.13 ml  Output 2250 ml  Net 1765.13 ml   Filed Weights   01/04/20 2231 01/05/20 0000 01/05/20 0333  Weight: 117 kg 117.6 kg 117.6 kg    Exam  General: Well developed, [place] ill-appearing, NAD  HEENT: NCAT, mucous membranes moist.   Cardiovascular: S1 S2 auscultated, RRR  Respiratory: Coarse breath sounds, expiratory wheezing  Abdomen: Soft, nontender, nondistended, + bowel sounds  Extremities: warm dry without cyanosis clubbing or edema  Neuro: AAOx3, nonfocal  Psych: anxious   Data Reviewed: I have personally reviewed following labs and imaging studies  CBC: Recent Labs  Lab 12/31/19 0131 01/04/20 1400 01/04/20 1416 01/05/20 0626  WBC 6.2 10.9*  --  10.1  NEUTROABS  --  9.5*  --   --   HGB 11.5* 11.4* 12.2 10.3*  HCT 37.7 36.2 36.0 33.2*  MCV 87.9 86.4  --  87.4  PLT 134* 146*  --  732*   Basic Metabolic Panel: Recent Labs  Lab 12/31/19 0131 01/04/20 1400 01/04/20 1416 01/05/20 0626  NA 138 137 135 138  K 3.6 4.0 3.9 3.7  CL 104 97*  --  99  CO2 23 25  --  27  GLUCOSE 188* 122*  --  106*  BUN 15 17  --  16  CREATININE 1.28* 1.75*  --  1.38*  CALCIUM 9.4 9.1  --  8.7*  MG  --   --   --  2.2  PHOS  --   --   --  3.5   GFR: Estimated Creatinine Clearance: 52.6 mL/min (A) (by C-G formula based on SCr of 1.38 mg/dL (H)). Liver Function Tests: Recent Labs  Lab 01/04/20 1400 01/05/20 0626  AST 66* 41  ALT 50* 39  ALKPHOS 101 89  BILITOT 0.7 0.6  PROT 6.6 5.5*  ALBUMIN 3.4* 2.8*   No results for input(s): LIPASE, AMYLASE in the last 168 hours. Recent Labs  Lab 01/04/20 1400 01/05/20 0626  AMMONIA 20 17   Coagulation Profile: Recent Labs  Lab 01/04/20 1400  INR 2.0*   Cardiac Enzymes: Recent Labs  Lab 01/05/20 0626  CKTOTAL 488*   BNP (last 3 results) No results for input(s): PROBNP in the last 8760 hours. HbA1C: No results for input(s): HGBA1C in the  last 72 hours. CBG: No results for input(s): GLUCAP in the last 168 hours. Lipid Profile: No results for input(s): CHOL, HDL, LDLCALC, TRIG, CHOLHDL, LDLDIRECT in the last 72 hours. Thyroid Function Tests: No results for input(s): TSH, T4TOTAL, FREET4, T3FREE, THYROIDAB in the last 72 hours. Anemia Panel: No results for input(s): VITAMINB12, FOLATE, FERRITIN, TIBC, IRON, RETICCTPCT in the last 72 hours. Urine analysis:    Component Value Date/Time   COLORURINE YELLOW 01/04/2020 2113   APPEARANCEUR CLEAR 01/04/2020 2113   LABSPEC 1.006 01/04/2020 2113   PHURINE 8.0 01/04/2020 2113   GLUCOSEU NEGATIVE 01/04/2020 2113   HGBUR NEGATIVE 01/04/2020 2113   BILIRUBINUR NEGATIVE 01/04/2020 2113   [place] NEGATIVE 01/04/2020 2113   PROTEINUR NEGATIVE 01/04/2020 2113   UROBILINOGEN 1.0 04/03/2014 1645   NITRITE NEGATIVE 01/04/2020 2113   LEUKOCYTESUR NEGATIVE 01/04/2020 2113   Sepsis Labs: _0 (procalcitonin:4,lacticidven:4)  ) Recent Results (from the past 240 hour(s))  Resp Panel by RT-PCR (Flu A&B, Covid) Nasopharyngeal Swab     Status: None   Collection Time: 12/31/19  1:32 AM   Specimen: Nasopharyngeal Swab; Nasopharyngeal(NP) swabs in vial transport medium  Result Value Ref Range Status   SARS Coronavirus 2 by RT PCR NEGATIVE NEGATIVE Final    Comment: (NOTE) SARS-CoV-2 target nucleic acids are NOT DETECTED.  The SARS-CoV-2 RNA is generally detectable in upper respiratory specimens during the acute phase of infection. The lowest concentration of SARS-CoV-2 viral copies this assay can detect is 138 copies/mL. A negative result does not preclude SARS-Cov-2 infection and should not be used as the sole basis for treatment or other patient management decisions. A negative result may occur with  improper specimen collection/handling, submission of specimen other than nasopharyngeal swab, presence of viral mutation(s) within the areas targeted by this assay, and inadequate  number of viral copies(<138 copies/mL). A negative result must be combined with clinical observations, patient history, and epidemiological information. The expected result is Negative.  Fact Sheet for Patients:  EntrepreneurPulse.com.au  Fact Sheet for Healthcare Providers:  IncredibleEmployment.be  This test is no t  yet approved or cleared by the [place] and  has been authorized for detection and/or diagnosis of SARS-CoV-2 by FDA under an Emergency Use Authorization (EUA). This EUA will remain  in effect (meaning this test can be used) for the duration of the COVID-19 declaration under Section 564(b)(1) of the Act, 21 U.S.C.section 360bbb-3(b)(1), unless the authorization is terminated  or revoked sooner.       Influenza A by PCR NEGATIVE NEGATIVE Final   Influenza B by PCR NEGATIVE NEGATIVE Final    Comment: (NOTE) The Xpert Xpress SARS-CoV-2/FLU/RSV plus assay is intended as an aid in the diagnosis of influenza from Nasopharyngeal swab specimens and should not be used as a sole basis for treatment. Nasal washings and aspirates are unacceptable for Xpert Xpress SARS-CoV-2/FLU/RSV testing.  Fact Sheet for Patients: EntrepreneurPulse.com.au  Fact Sheet for Healthcare Providers: IncredibleEmployment.be  This test is not yet approved or cleared by the [place] FDA and has been authorized for detection and/or diagnosis of SARS-CoV-2 by FDA under an Emergency Use Authorization (EUA). This EUA will remain in effect (meaning this test can be used) for the duration of the COVID-19 declaration under Section 564(b)(1) of the Act, 21 U.S.C. section 360bbb-3(b)(1), unless the authorization is terminated or revoked.  Performed at [facility], [place] [address], [place], [place] [address]   Blood Culture (routine x 2)     Status: None (Preliminary result)   Collection Time: 01/04/20  1:47 PM    Specimen: BLOOD  Result Value Ref Range Status   Specimen Description BLOOD BLOOD LEFT HAND  Final   Special Requests   Final    BOTTLES DRAWN AEROBIC ONLY Blood Culture results may not be optimal due to an inadequate volume of blood received in culture bottles Performed at [facility] [address] [place], [place] [address]    Culture PENDING  Incomplete   Report Status PENDING  Incomplete  Resp Panel by RT-PCR (Flu A&B, Covid) Nasopharyngeal Swab     Status: None   Collection Time: 01/04/20  2:00 PM   Specimen: Nasopharyngeal Swab; Nasopharyngeal(NP) swabs in vial transport medium  Result Value Ref Range Status   SARS Coronavirus 2 by RT PCR NEGATIVE NEGATIVE Final    Comment: (NOTE) SARS-CoV-2 target nucleic acids are NOT DETECTED.  The SARS-CoV-2 RNA is generally detectable in upper respiratory specimens during the acute phase of infection. The lowest concentration of SARS-CoV-2 viral copies this assay can detect is 138 copies/mL. A negative result does not preclude SARS-Cov-2 infection and should not be used as the sole basis for treatment or other patient management decisions. A negative result may occur with  improper specimen collection/handling, submission of specimen other than nasopharyngeal swab, presence of viral mutation(s) within the areas targeted by this assay, and inadequate number of viral copies(<138 copies/mL). A negative result must be combined with clinical observations, patient history, and epidemiological information. The expected result is Negative.  Fact Sheet for Patients:  EntrepreneurPulse.com.au  Fact Sheet for Healthcare Providers:  IncredibleEmployment.be  This test is no t yet approved or cleared by the [place] FDA and  has been authorized for detection and/or diagnosis of SARS-CoV-2 by FDA under an Emergency Use Authorization (EUA). This EUA will remain  in effect (meaning this test can be  used) for the duration of the COVID-19 declaration under Section 564(b)(1) of the Act, 21 U.S.C.section 360bbb-3(b)(1), unless the authorization is terminated  or revoked sooner.  Influenza A by PCR NEGATIVE NEGATIVE Final   Influenza B by PCR NEGATIVE NEGATIVE Final    Comment: (NOTE) The Xpert Xpress SARS-CoV-2/FLU/RSV plus assay is intended as an aid in the diagnosis of influenza from Nasopharyngeal swab specimens and should not be used as a sole basis for treatment. Nasal washings and aspirates are unacceptable for Xpert Xpress SARS-CoV-2/FLU/RSV testing.  Fact Sheet for Patients: EntrepreneurPulse.com.au  Fact Sheet for Healthcare Providers: IncredibleEmployment.be  This test is not yet approved or cleared by the [place] FDA and has been authorized for detection and/or diagnosis of SARS-CoV-2 by FDA under an Emergency Use Authorization (EUA). This EUA will remain in effect (meaning this test can be used) for the duration of the COVID-19 declaration under Section 564(b)(1) of the Act, 21 U.S.C. section 360bbb-3(b)(1), unless the authorization is terminated or revoked.  Performed at [facility], [place] [address], [place], [place] [address]   Respiratory Panel by PCR     Status: None   Collection Time: 01/04/20  7:21 PM   Specimen: Nasopharyngeal Swab; Respiratory  Result Value Ref Range Status   Adenovirus NOT DETECTED NOT DETECTED Final   Coronavirus 229E NOT DETECTED NOT DETECTED Final    Comment: (NOTE) The Coronavirus on the Respiratory Panel, DOES NOT test for the novel  Coronavirus (2019 nCoV)    Coronavirus HKU1 NOT DETECTED NOT DETECTED Final   Coronavirus NL63 NOT DETECTED NOT DETECTED Final   Coronavirus OC43 NOT DETECTED NOT DETECTED Final   Metapneumovirus NOT DETECTED NOT DETECTED Final   Rhinovirus / Enterovirus NOT DETECTED NOT DETECTED Final   Influenza A NOT DETECTED NOT DETECTED Final   Influenza  B NOT DETECTED NOT DETECTED Final   Parainfluenza Virus 1 NOT DETECTED NOT DETECTED Final   Parainfluenza Virus 2 NOT DETECTED NOT DETECTED Final   Parainfluenza Virus 3 NOT DETECTED NOT DETECTED Final   Parainfluenza Virus 4 NOT DETECTED NOT DETECTED Final   Respiratory Syncytial Virus NOT DETECTED NOT DETECTED Final   Bordetella pertussis NOT DETECTED NOT DETECTED Final   Bordetella Parapertussis NOT DETECTED NOT DETECTED Final   Chlamydophila pneumoniae NOT DETECTED NOT DETECTED Final   Mycoplasma pneumoniae NOT DETECTED NOT DETECTED Final    Comment: Performed at [facility], [place]. [address], [place], [place] [address]      Radiology Studies: CT Head Wo Contrast  Result Date: 01/04/2020 CLINICAL DATA:  Mental status change. Code sepsis. EXAM: CT HEAD WITHOUT CONTRAST TECHNIQUE: Contiguous axial images were obtained from the base of the skull through the vertex without intravenous contrast. COMPARISON:  Head CT 07/14/2018 at [place]: Brain: Brain volume is normal for age. No intracranial hemorrhage, mass effect, or midline shift. Mild periventricular chronic small vessel ischemia. No hydrocephalus. The basilar cisterns are patent. No evidence of territorial infarct or acute ischemia. No extra-axial or intracranial fluid collection. Vascular: No hyperdense vessel or unexpected calcification. Skull: No fracture or focal lesion. Sinuses/Orbits: Paranasal sinuses and mastoid air cells are clear. The visualized orbits are unremarkable. Other: None. IMPRESSION: 1. No acute intracranial abnormality. 2. Mild chronic small vessel ischemia. Electronically Signed   By: Keith Rake M.D.   On: 01/04/2020 15:52   DG Chest Port 1 View  Result Date: 01/04/2020 CLINICAL DATA:  Questionable sepsis. Weakness. EXAM: PORTABLE CHEST 1 VIEW COMPARISON:  Chest radiograph and CTA 4 days ago 12/31/2019 FINDINGS: Lower lung volumes from prior exam with crowding of bronchovascular  structures. Cardiomegaly is stable. Aortic tortuosity and  atherosclerosis. Progressive peribronchial thickening. Mild patchy opacity at the left lung base. No significant pleural effusion. No pneumothorax. Grossly stable osseous structures. IMPRESSION: 1. Patchy opacity at the left lung base, increased over the past 4 days may be atelectasis or pneumonia. 2. Progressive peribronchial thickening may be bronchitic or pulmonary edema. Stable cardiomegaly. Electronically Signed   By: Keith Rake M.D.   On: 01/04/2020 15:14     Scheduled Meds: . arformoterol  15 mcg Nebulization BID  . budesonide (PULMICORT) nebulizer solution  0.5 mg Nebulization BID   Continuous Infusions: . azithromycin Stopped (01/04/20 1808)  . cefTRIAXone (ROCEPHIN)  IV Stopped (01/04/20 1753)  . heparin 1,750 Units/hr (01/05/20 0715)     LOS: 1 day   Time Spent in minutes   45 minutes  Pearce Littlefield D.O. on 01/05/2020 at 9:58 AM  Between 7am to 7pm - Please see pager noted on [email or web address]  After 7pm go to [email or web address]  And look for the night coverage person covering for me after hours  [facility] Office  [phone]

## 2020-01-05 NOTE — Progress Notes (Signed)
Patient C/O thick secretions.  Patient evidentially familiar with mucomyst would like an order for that, along with order for mucinex.  Patient c/o not receiving diuretic states she take 20mg  Qday.

## 2020-01-05 NOTE — Progress Notes (Signed)
Pt stating she had a "gold" necklace and earrings on when she came to the hospital. Ring on left hand 3 bracelets on rt hand noted.

## 2020-01-06 LAB — BASIC METABOLIC PANEL
Anion gap: 11 (ref 5–15)
BUN: 11 mg/dL (ref 8–23)
CO2: 25 mmol/L (ref 22–32)
Calcium: 9.2 mg/dL (ref 8.9–10.3)
Chloride: 101 mmol/L (ref 98–111)
Creatinine, Ser: 1.19 mg/dL — ABNORMAL HIGH (ref 0.44–1.00)
GFR, Estimated: 51 mL/min — ABNORMAL LOW (ref >60)
Glucose, Bld: 107 mg/dL — ABNORMAL HIGH (ref 70–99)
Potassium: 3.6 mmol/L (ref 3.5–5.1)
Sodium: 137 mmol/L (ref 135–145)

## 2020-01-06 LAB — PROCALCITONIN: Procalcitonin: 8.2 ng/mL

## 2020-01-06 LAB — EXPECTORATED SPUTUM ASSESSMENT W GRAM STAIN, RFLX TO RESP C

## 2020-01-06 LAB — LACTIC ACID, PLASMA: Lactic Acid, Venous: 2.1 mmol/L (ref 0.5–1.9)

## 2020-01-06 LAB — HEMOGLOBIN AND HEMATOCRIT, BLOOD
HCT: 31.7 % — ABNORMAL LOW (ref 36.0–46.0)
Hemoglobin: 9.8 g/dL — ABNORMAL LOW (ref 12.0–15.0)

## 2020-01-06 MED ORDER — ESZOPICLONE 1 MG PO TABS
1.0000 mg | ORAL_TABLET | Freq: Every day | ORAL | Status: DC
  Administered 2020-01-06 – 2020-01-07 (×2): 1 mg via ORAL
  Filled 2020-01-06 (×2): qty 1

## 2020-01-06 MED ORDER — ACETYLCYSTEINE 20 % IN SOLN
2.0000 mL | Freq: Three times a day (TID) | RESPIRATORY_TRACT | Status: DC
  Administered 2020-01-06 – 2020-01-08 (×5): 2 mL via RESPIRATORY_TRACT
  Filled 2020-01-06: qty 2
  Filled 2020-01-06 (×3): qty 4
  Filled 2020-01-06: qty 2
  Filled 2020-01-06 (×6): qty 4

## 2020-01-06 MED ORDER — ALPRAZOLAM 0.5 MG PO TABS
0.5000 mg | ORAL_TABLET | Freq: Three times a day (TID) | ORAL | Status: DC | PRN
  Administered 2020-01-06: 13:00:00 1 mg via ORAL
  Administered 2020-01-07 (×2): 0.5 mg via ORAL
  Administered 2020-01-08: 10:00:00 1 mg via ORAL
  Filled 2020-01-06 (×4): qty 2
  Filled 2020-01-06: qty 1

## 2020-01-06 MED ORDER — FUROSEMIDE 20 MG PO TABS: 20.0000 mg | ORAL_TABLET | Freq: Every day | ORAL | Status: DC | PRN

## 2020-01-06 MED ORDER — GUAIFENESIN ER 600 MG PO TB12
1200.0000 mg | ORAL_TABLET | Freq: Two times a day (BID) | ORAL | Status: DC
  Administered 2020-01-06 – 2020-01-08 (×5): 1200 mg via ORAL
  Filled 2020-01-06 (×5): qty 2

## 2020-01-06 NOTE — Plan of Care (Signed)
?  Problem: Education: ?Goal: Ability to demonstrate management of disease process will improve ?Outcome: Progressing ?Goal: Ability to verbalize understanding of medication therapies will improve ?Outcome: Progressing ?Goal: Individualized Educational Video(s) ?Outcome: Progressing ?  ?Problem: Activity: ?Goal: Capacity to carry out activities will improve ?Outcome: Progressing ?  ?Problem: Cardiac: ?Goal: Ability to achieve and maintain adequate cardiopulmonary perfusion will improve ?Outcome: Progressing ?  ?Problem: Education: ?Goal: Knowledge of disease or condition will improve ?Outcome: Progressing ?Goal: Knowledge of the prescribed therapeutic regimen will improve ?Outcome: Progressing ?Goal: Individualized Educational Video(s) ?Outcome: Progressing ?  ?Problem: Activity: ?Goal: Ability to tolerate increased activity will improve ?Outcome: Progressing ?Goal: Will verbalize the importance of balancing activity with adequate rest periods ?Outcome: Progressing ?  ?Problem: Respiratory: ?Goal: Ability to maintain a clear airway will improve ?Outcome: Progressing ?Goal: Levels of oxygenation will improve ?Outcome: Progressing ?Goal: Ability to maintain adequate ventilation will improve ?Outcome: Progressing ?  ?

## 2020-01-06 NOTE — Progress Notes (Signed)
PROGRESS NOTE    [name]  DPO:[id number] DOB: March 22, 1955 DOA: 01/04/2020 PCP: Bernerd Limbo, MD   Brief Narrative:  HPI On 01/04/2020 by Dr. Wendee Beavers [name] is a 64 y.o. female with history of COPD not on oxygen, chronic pain/OA on opiate, hepatitis B, DVT/PE on Xarelto, diastolic CHF, HTN, anxiety, bipolar disorder, CKD-3 and morbid obesity brought to ED by EMS due to altered mental status, cough, shortness of breath, congestion and fever.  On my evaluation, patient was somewhat somnolent.  Briefly wakes to voice and falls back to ssleep. Oriented to self and place but not able to stay awake to provide history.  Follows some commands.  No family at bedside.  Attempted to call daughter for collateral information but no answer.  In ED, febrile to 100.6.  HR 120>> 88.. RR 41>> 27.  BP 92/45>> 133/76. Sat 91% on RA>2L>> 98%.  WBC 10.9 with left shift.  Hgb 11.4 (baseline).  Lactic acid 2.9. Cr 1.75 (1.26 about 4 days ago).  AST 66.  ALT 50.  VBG 7.48/40/72/30.  PT/INR 22/2.0.  COVID-19 and influenza PCR nonreactive.  EKG sinus tachycardia without acute ischemic finding.  Portable CXR with patchy opacity at left lung base and progressive peribronchial thickening.  CT head without acute finding.  Cultures obtained.  Received LR bolus 1.5 L and started on maintenance at 150 cc an hour.  Started on ceftriaxone, azithromycin.  Hospitalist service called for admission for possible sepsis due to pneumonia.  Interim history Patient met with severe sepsis secondary to possible aspiration pneumonia.  Continue IV Unasyn.  Have added on Mucinex as well as Mucomyst neb treatment.  Patient was also found to have acute metabolic encephalopathy on admission however this is now resolved.  Restarting some of her home medication such as Xanax for anxiety and oxycodone for pain. Assessment & Plan   Severe sepsis secondary to possible aspiration pneumonia -On admission, present met sepsis criteria with  fever, tachycardia, tachypnea, elevated lactic acid level, acute kidney injury as well as altered mental status -Chest x-ray showed left lingula and left base opacity -Was placed on ceftriaxone and azithromycin-however given that this is possibly aspiration, switch to Unasyn -Blood cultures show no growth to date -Will attempt to obtain sputum culture if possible -Patient tells me that she has followed up with gastroenterology and was told that she does aspirate especially when she lays down.  She has had an EGD and colonoscopy recently at [facility] consulted and recommended regular diet with thin liquids -Continue neb treatments, have added Mucinex.  Continue flutter valve as well as chest PT  Acute metabolic encephalopathy -Resolved, secondary to the above versus possible medications -Patient currently alert and oriented x3 -CT head without acute findings -Patient was also given 1 dose of IV Narcan  Acute kidney injury on chronic kidney disease, stage IIIa -Likely secondary to sepsis -Baseline creatinine approximately 1.2-1.4, was 1.75 on admission -IV fluids discontinued -Creatinine down to 1.19 -Continue to monitor BMP  Elevated LFTs/ Hepatitis B -Improved -Hepatitis panel shows reactive hepatitis C antibody- patient will need to follow up as an outpatient. Additional testing pending. -Continue Entecavir  Chronic diastolic heart failure/pulmonary hypertension -Echocardiogram in 2017 showed an EF of 55 to 43%, grade 1 diastolic dysfunction, PA P of 47 mmHg -Currently does not appear to be volume overloaded  -Lasix initially held due to AKI.  Will restart 20 mg p.o. daily as needed -Monitor intake and output, daily weights  Chronic COPD -VBG now consistent with COPD exacerbation -Continue nebulizer treatments as well as budesonide, Brovana and antibiotics as listed above  History of PE/DVT -Xarelto was initially held and patient was placed on  heparin -Continue Xarelto  Essential hypertension -BP appears to be stable, will restart home medications of amlodipine -BP stable  Chronic pain syndrome/osteoarthritis -Patient states she follows up with the pain management physician and she is on oxycodone 10 mg 6 times daily -Reviewed [place] controlled substance database, patient does receive oxycodone on a tablets monthly along with Xanax, Lyrica and most recently lunesta  -Continue baclofen, oxycodone 10 mg every 6 hours as needed  Insomnia -Patient recently started Lunesta 3 mg, this may be too much given her recent acute metabolic encephalopathy. -Will restart at 1 mg nightly  Anxiety/bipolar disorder -Continue Xanax, patient takes 1 mg 3 times daily at home.  Given her acute metabolic encephalopathy have restarted at 0.5 to 1 mg 3 times daily as needed.  GERD -Continue PPI   Normocytic anemia -Secondary to IV fluids/dilutional component -Continue to monitor CBC  Physical deconditioning -PT consulted, no further PT follow-up needed.  Rolling walker with 5 meals recommended  DVT Prophylaxis  Heparin--> xarelto  Code Status: Full  Family Communication: None at bedside  Disposition Plan:  Status is: Inpatient  Remains inpatient appropriate because:IV treatments appropriate due to intensity of illness or inability to take PO and Inpatient level of care appropriate due to severity of illness   Dispo: The patient is from: Home              Anticipated d/c is to: Home              Anticipated d/c date is: 2 days              Patient currently is not medically stable to d/c.   Consultants None  Procedures  None  Antibiotics   Anti-infectives (From admission, onward)   Start     Dose/Rate Route Frequency Ordered Stop   01/05/20 1200  Ampicillin-Sulbactam (UNASYN) 3 g in sodium chloride 0.9 % 100 mL IVPB        3 g 200 mL/hr over 30 Minutes Intravenous Every 8 hours 01/05/20 1035     01/05/20 1115  entecavir  (BARACLUDE) tablet 0.5 mg        0.5 mg Oral Every other day 01/05/20 1020     01/04/20 1530  cefTRIAXone (ROCEPHIN) 2 g in sodium chloride 0.9 % 100 mL IVPB  Status:  Discontinued        2 g 200 mL/hr over 30 Minutes Intravenous Every 24 hours 01/04/20 1524 01/05/20 1035   01/04/20 1530  azithromycin (ZITHROMAX) 500 mg in sodium chloride 0.9 % 250 mL IVPB  Status:  Discontinued        500 mg 250 mL/hr over 60 Minutes Intravenous Every 24 hours 01/04/20 1524 01/05/20 1035      Subjective:   Taunia Prim seen and examined today.  Patient upset about her medications and how her dosages have been changed.  Continues to complain of congestion and cough.  Denies current shortness of breath, chest pain, abdominal pain, nausea or vomiting, dizziness or headache. Objective:   Vitals:   01/06/20 0335 01/06/20 0726 01/06/20 1120 01/06/20 1121  BP: 121/74 (!) 168/91 (!) 164/93   Pulse: 83 85 99   Resp: _0 Temp: 98.2 F (36.8 C) 98.5 F (36.9 C) 98.5 F (36.9 C)  TempSrc: Oral Oral Oral   SpO2: 91% 92% 96% 96%  Weight:      Height:        Intake/Output Summary (Last 24 hours) at 01/06/2020 1357 Last data filed at 01/06/2020 1354 Gross per 24 hour  Intake 1997.46 ml  Output 3200 ml  Net -1202.54 ml   Filed Weights   01/05/20 0000 01/05/20 0333 01/06/20 0000  Weight: 117.6 kg 117.6 kg 119.7 kg   Exam  General: Well developed, chronically ill-appearing, NAD  HEENT: NCAT,  mucous membranes moist.   Cardiovascular: S1 S2 auscultated, RRR  Respiratory: Coarse breath sounds, expiratory wheezing  Abdomen: Soft, nontender, nondistended, + bowel sounds  Extremities: warm dry without cyanosis clubbing or edema  Neuro: AAOx3, nonfocal  Psych: appropriate mood and affect   Data Reviewed: I have personally reviewed following labs and imaging studies  CBC: Recent Labs  Lab 12/31/19 0131 01/04/20 1400 01/04/20 1416 01/05/20 0626 01/06/20 0448  WBC 6.2 10.9*  --  10.1   --   NEUTROABS  --  9.5*  --   --   --   HGB 11.5* 11.4* 12.2 10.3* 9.8*  HCT 37.7 36.2 36.0 33.2* 31.7*  MCV 87.9 86.4  --  87.4  --   PLT 134* 146*  --  109*  --    Basic Metabolic Panel: Recent Labs  Lab 12/31/19 0131 01/04/20 1400 01/04/20 1416 01/05/20 0626 01/06/20 0629  NA 138 137 135 138 137  K 3.6 4.0 3.9 3.7 3.6  CL 104 97*  --  99 101  CO2 23 25  --  27 25  GLUCOSE 188* 122*  --  106* 107*  BUN 15 17  --  16 11  CREATININE 1.28* 1.75*  --  1.38* 1.19*  CALCIUM 9.4 9.1  --  8.7* 9.2  MG  --   --   --  2.2  --   PHOS  --   --   --  3.5  --    GFR: Estimated Creatinine Clearance: 61.6 mL/min (A) (by C-G formula based on SCr of 1.19 mg/dL (H)). Liver Function Tests: Recent Labs  Lab 01/04/20 1400 01/05/20 0626  AST 66* 41  ALT 50* 39  ALKPHOS 101 89  BILITOT 0.7 0.6  PROT 6.6 5.5*  ALBUMIN 3.4* 2.8*   No results for input(s): LIPASE, AMYLASE in the last 168 hours. Recent Labs  Lab 01/04/20 1400 01/05/20 0626  AMMONIA 20 17   Coagulation Profile: Recent Labs  Lab 01/04/20 1400  INR 2.0*   Cardiac Enzymes: Recent Labs  Lab 01/05/20 0626  CKTOTAL 488*   BNP (last 3 results) No results for input(s): PROBNP in the last 8760 hours. HbA1C: No results for input(s): HGBA1C in the last 72 hours. CBG: No results for input(s): GLUCAP in the last 168 hours. Lipid Profile: No results for input(s): CHOL, HDL, LDLCALC, TRIG, CHOLHDL, LDLDIRECT in the last 72 hours. Thyroid Function Tests: No results for input(s): TSH, T4TOTAL, FREET4, T3FREE, THYROIDAB in the last 72 hours. Anemia Panel: No results for input(s): VITAMINB12, FOLATE, FERRITIN, TIBC, IRON, RETICCTPCT in the last 72 hours. Urine analysis:    Component Value Date/Time   COLORURINE YELLOW 01/04/2020 2113   APPEARANCEUR CLEAR 01/04/2020 2113   LABSPEC 1.006 01/04/2020 2113   PHURINE 8.0 01/04/2020 2113   GLUCOSEU NEGATIVE 01/04/2020 2113   HGBUR NEGATIVE 01/04/2020 2113   [place]  NEGATIVE 01/04/2020 2113   [place] [address] NEGATIVE 01/04/2020 2113   PROTEINUR NEGATIVE 01/04/2020 2113  UROBILINOGEN 1.0 04/03/2014 1645   NITRITE NEGATIVE 01/04/2020 2113   LEUKOCYTESUR NEGATIVE 01/04/2020 2113   Sepsis Labs: _0 (procalcitonin:4,lacticidven:4)  ) Recent Results (from the past 240 hour(s))  Resp Panel by RT-PCR (Flu A&B, Covid) Nasopharyngeal Swab     Status: None   Collection Time: 12/31/19  1:32 AM   Specimen: Nasopharyngeal Swab; Nasopharyngeal(NP) swabs in vial transport medium  Result Value Ref Range Status   SARS Coronavirus 2 by RT PCR NEGATIVE NEGATIVE Final    Comment: (NOTE) SARS-CoV-2 target nucleic acids are NOT DETECTED.  The SARS-CoV-2 RNA is generally detectable in upper respiratory specimens during the acute phase of infection. The lowest concentration of SARS-CoV-2 viral copies this assay can detect is 138 copies/mL. A negative result does not preclude SARS-Cov-2 infection and should not be used as the sole basis for treatment or other patient management decisions. A negative result may occur with  improper specimen collection/handling, submission of specimen other than nasopharyngeal swab, presence of viral mutation(s) within the areas targeted by this assay, and inadequate number of viral copies(<138 copies/mL). A negative result must be combined with clinical observations, patient history, and epidemiological information. The expected result is Negative.  Fact Sheet for Patients:  EntrepreneurPulse.com.au  Fact Sheet for Healthcare Providers:  IncredibleEmployment.be  This test is no t yet approved or cleared by the [place] FDA and  has been authorized for detection and/or diagnosis of SARS-CoV-2 by FDA under an Emergency Use Authorization (EUA). This EUA will remain  in effect (meaning this test can be used) for the duration of the COVID-19 declaration under Section 564(b)(1) of the Act,  21 U.S.C.section 360bbb-3(b)(1), unless the authorization is terminated  or revoked sooner.       Influenza A by PCR NEGATIVE NEGATIVE Final   Influenza B by PCR NEGATIVE NEGATIVE Final    Comment: (NOTE) The Xpert Xpress SARS-CoV-2/FLU/RSV plus assay is intended as an aid in the diagnosis of influenza from Nasopharyngeal swab specimens and should not be used as a sole basis for treatment. Nasal washings and aspirates are unacceptable for Xpert Xpress SARS-CoV-2/FLU/RSV testing.  Fact Sheet for Patients: EntrepreneurPulse.com.au  Fact Sheet for Healthcare Providers: IncredibleEmployment.be  This test is not yet approved or cleared by the [place] FDA and has been authorized for detection and/or diagnosis of SARS-CoV-2 by FDA under an Emergency Use Authorization (EUA). This EUA will remain in effect (meaning this test can be used) for the duration of the COVID-19 declaration under Section 564(b)(1) of the Act, 21 U.S.C. section 360bbb-3(b)(1), unless the authorization is terminated or revoked.  Performed at [facility], [place] [address] [place], [place] [address]   Blood Culture (routine x 2)     Status: None (Preliminary result)   Collection Time: 01/04/20  1:47 PM   Specimen: BLOOD  Result Value Ref Range Status   Specimen Description BLOOD BLOOD LEFT HAND  Final   Special Requests   Final    BOTTLES DRAWN AEROBIC ONLY Blood Culture results may not be optimal due to an inadequate volume of blood received in culture bottles   Culture   Final    NO GROWTH < 24 HOURS Performed at [facility], [place] [address] [place], [place] [address]    Report Status PENDING  Incomplete  Blood Culture (routine x 2)     Status: None (Preliminary result)   Collection Time: 01/04/20  2:00 PM   Specimen: BLOOD  Result Value Ref Range Status   Specimen  Description BLOOD BLOOD RIGHT HAND  Final   Special Requests   Final    BOTTLES DRAWN  AEROBIC AND ANAEROBIC Blood Culture adequate volume   Culture   Final    NO GROWTH < 24 HOURS Performed at [facility], [address], [place], [place] [address]    Report Status PENDING  Incomplete  Resp Panel by RT-PCR (Flu A&B, Covid) Nasopharyngeal Swab     Status: None   Collection Time: 01/04/20  2:00 PM   Specimen: Nasopharyngeal Swab; Nasopharyngeal(NP) swabs in vial transport medium  Result Value Ref Range Status   SARS Coronavirus 2 by RT PCR NEGATIVE NEGATIVE Final    Comment: (NOTE) SARS-CoV-2 target nucleic acids are NOT DETECTED.  The SARS-CoV-2 RNA is generally detectable in upper respiratory specimens during the acute phase of infection. The lowest concentration of SARS-CoV-2 viral copies this assay can detect is 138 copies/mL. A negative result does not preclude SARS-Cov-2 infection and should not be used as the sole basis for treatment or other patient management decisions. A negative result may occur with  improper specimen collection/handling, submission of specimen other than nasopharyngeal swab, presence of viral mutation(s) within the areas targeted by this assay, and inadequate number of viral copies(<138 copies/mL). A negative result must be combined with clinical observations, patient history, and epidemiological information. The expected result is Negative.  Fact Sheet for Patients:  EntrepreneurPulse.com.au  Fact Sheet for Healthcare Providers:  IncredibleEmployment.be  This test is no t yet approved or cleared by the [place] FDA and  has been authorized for detection and/or diagnosis of SARS-CoV-2 by FDA under an Emergency Use Authorization (EUA). This EUA will remain  in effect (meaning this test can be used) for the duration of the COVID-19 declaration under Section 564(b)(1) of the Act, 21 U.S.C.section 360bbb-3(b)(1), unless the authorization is terminated  or revoked sooner.       Influenza A  by PCR NEGATIVE NEGATIVE Final   Influenza B by PCR NEGATIVE NEGATIVE Final    Comment: (NOTE) The Xpert Xpress SARS-CoV-2/FLU/RSV plus assay is intended as an aid in the diagnosis of influenza from Nasopharyngeal swab specimens and should not be used as a sole basis for treatment. Nasal washings and aspirates are unacceptable for Xpert Xpress SARS-CoV-2/FLU/RSV testing.  Fact Sheet for Patients: EntrepreneurPulse.com.au  Fact Sheet for Healthcare Providers: IncredibleEmployment.be  This test is not yet approved or cleared by the [place] FDA and has been authorized for detection and/or diagnosis of SARS-CoV-2 by FDA under an Emergency Use Authorization (EUA). This EUA will remain in effect (meaning this test can be used) for the duration of the COVID-19 declaration under Section 564(b)(1) of the Act, 21 U.S.C. section 360bbb-3(b)(1), unless the authorization is terminated or revoked.  Performed at [facility], [place] [address] [place], [place] [address]   Respiratory Panel by PCR     Status: None   Collection Time: 01/04/20  7:21 PM   Specimen: Nasopharyngeal Swab; Respiratory  Result Value Ref Range Status   Adenovirus NOT DETECTED NOT DETECTED Final   Coronavirus 229E NOT DETECTED NOT DETECTED Final    Comment: (NOTE) The Coronavirus on the Respiratory Panel, DOES NOT test for the novel  Coronavirus (2019 nCoV)    Coronavirus HKU1 NOT DETECTED NOT DETECTED Final   Coronavirus NL63 NOT DETECTED NOT DETECTED Final   Coronavirus OC43 NOT DETECTED NOT DETECTED Final   Metapneumovirus NOT DETECTED NOT DETECTED Final   Rhinovirus / Enterovirus NOT DETECTED  NOT DETECTED Final   Influenza A NOT DETECTED NOT DETECTED Final   Influenza B NOT DETECTED NOT DETECTED Final   Parainfluenza Virus 1 NOT DETECTED NOT DETECTED Final   Parainfluenza Virus 2 NOT DETECTED NOT DETECTED Final   Parainfluenza Virus 3 NOT DETECTED NOT DETECTED Final    Parainfluenza Virus 4 NOT DETECTED NOT DETECTED Final   Respiratory Syncytial Virus NOT DETECTED NOT DETECTED Final   Bordetella pertussis NOT DETECTED NOT DETECTED Final   Bordetella Parapertussis NOT DETECTED NOT DETECTED Final   Chlamydophila pneumoniae NOT DETECTED NOT DETECTED Final   Mycoplasma pneumoniae NOT DETECTED NOT DETECTED Final    Comment: Performed at [facility], [place] [address] [place], [place] [address]  Urine culture     Status: None   Collection Time: 01/04/20  9:17 PM   Specimen: Urine, Random  Result Value Ref Range Status   Specimen Description URINE, RANDOM  Final   Special Requests NONE  Final   Culture   Final    NO GROWTH Performed at [facility], [place] [address], [place], [place] [address]    Report Status 01/05/2020 FINAL  Final      Radiology Studies: CT Head Wo Contrast  Result Date: 01/04/2020 CLINICAL DATA:  Mental status change. Code sepsis. EXAM: CT HEAD WITHOUT CONTRAST TECHNIQUE: Contiguous axial images were obtained from the base of the skull through the vertex without intravenous contrast. COMPARISON:  Head CT 07/14/2018 at [place]: Brain: Brain volume is normal for age. No intracranial hemorrhage, mass effect, or midline shift. Mild periventricular chronic small vessel ischemia. No hydrocephalus. The basilar cisterns are patent. No evidence of territorial infarct or acute ischemia. No extra-axial or intracranial fluid collection. Vascular: No hyperdense vessel or unexpected calcification. Skull: No fracture or focal lesion. Sinuses/Orbits: Paranasal sinuses and mastoid air cells are clear. The visualized orbits are unremarkable. Other: None. IMPRESSION: 1. No acute intracranial abnormality. 2. Mild chronic small vessel ischemia. Electronically Signed   By: Keith Rake M.D.   On: 01/04/2020 15:52   DG Chest Port 1 View  Result Date: 01/04/2020 CLINICAL DATA:  Questionable sepsis. Weakness. EXAM: PORTABLE  CHEST 1 VIEW COMPARISON:  Chest radiograph and CTA 4 days ago 12/31/2019 FINDINGS: Lower lung volumes from prior exam with crowding of bronchovascular structures. Cardiomegaly is stable. Aortic tortuosity and atherosclerosis. Progressive peribronchial thickening. Mild patchy opacity at the left lung base. No significant pleural effusion. No pneumothorax. Grossly stable osseous structures. IMPRESSION: 1. Patchy opacity at the left lung base, increased over the past 4 days may be atelectasis or pneumonia. 2. Progressive peribronchial thickening may be bronchitic or pulmonary edema. Stable cardiomegaly. Electronically Signed   By: Keith Rake M.D.   On: 01/04/2020 15:14     Scheduled Meds: . acetylcysteine  2 mL Nebulization TID  . amLODipine  5 mg Oral Daily  . arformoterol  15 mcg Nebulization BID  . baclofen  20 mg Oral QHS  . budesonide (PULMICORT) nebulizer solution  0.5 mg Nebulization BID  . cholecalciferol  2,000 Units Oral Daily  . entecavir  0.5 mg Oral QODAY  . eszopiclone  1 mg Oral QHS  . famotidine  20 mg Oral BID  . guaiFENesin  1,200 mg Oral BID  . Iloperidone  4 mg Oral Daily  . Methylnaltrexone Bromide  450 mg Oral Daily  . pantoprazole  40 mg Oral BID AC  . pregabalin  75 mg Oral TID  . rivaroxaban  20  mg Oral Q supper   Continuous Infusions: . ampicillin-sulbactam (UNASYN) IV 3 g (01/06/20 0808)     LOS: 2 days   Time Spent in minutes   45 minutes  Imogene Gravelle D.O. on 01/06/2020 at 1:57 PM  Between 7am to 7pm - Please see pager noted on [email or web address]  After 7pm go to [email or web address]  And look for the night coverage person covering for me after hours  [facility] Office  [phone]

## 2020-01-06 NOTE — Plan of Care (Signed)
  Problem: Activity: Goal: Ability to tolerate increased activity will improve Outcome: Progressing   Problem: Respiratory: Goal: Ability to maintain a clear airway will improve Outcome: Progressing   

## 2020-01-07 ENCOUNTER — Telehealth: Payer: Self-pay | Admitting: Orthopaedic Surgery

## 2020-01-07 DIAGNOSIS — J189 Pneumonia, unspecified organism: Secondary | ICD-10-CM

## 2020-01-07 DIAGNOSIS — K5903 Drug induced constipation: Secondary | ICD-10-CM

## 2020-01-07 LAB — HEMOGLOBIN AND HEMATOCRIT, BLOOD
HCT: 31.5 % — ABNORMAL LOW (ref 36.0–46.0)
Hemoglobin: 10.2 g/dL — ABNORMAL LOW (ref 12.0–15.0)

## 2020-01-07 LAB — BASIC METABOLIC PANEL
Anion gap: 11 (ref 5–15)
BUN: 9 mg/dL (ref 8–23)
CO2: 24 mmol/L (ref 22–32)
Calcium: 9.1 mg/dL (ref 8.9–10.3)
Chloride: 103 mmol/L (ref 98–111)
Creatinine, Ser: 1.12 mg/dL — ABNORMAL HIGH (ref 0.44–1.00)
GFR, Estimated: 55 mL/min — ABNORMAL LOW (ref >60)
Glucose, Bld: 116 mg/dL — ABNORMAL HIGH (ref 70–99)
Potassium: 3.7 mmol/L (ref 3.5–5.1)
Sodium: 138 mmol/L (ref 135–145)

## 2020-01-07 LAB — HCV RNA NAA QUALITATIVE: HCV RNA NAA QUALITATIVE: NEGATIVE

## 2020-01-07 LAB — HCV AB W/RFLX TO VERIFICATION: HCV Ab: 5.5 s/co ratio — ABNORMAL HIGH (ref 0.0–0.9)

## 2020-01-07 MED ORDER — BIOTENE DRY MOUTH MT LIQD: 15.0000 mL | OROMUCOSAL | Status: DC | PRN

## 2020-01-07 MED ORDER — DOCUSATE SODIUM 100 MG PO CAPS
100.0000 mg | ORAL_CAPSULE | Freq: Two times a day (BID) | ORAL | Status: DC
  Administered 2020-01-07: 14:00:00 100 mg via ORAL
  Filled 2020-01-07 (×2): qty 1

## 2020-01-07 MED ORDER — AMOXICILLIN-POT CLAVULANATE 875-125 MG PO TABS
1.0000 | ORAL_TABLET | Freq: Two times a day (BID) | ORAL | Status: DC
  Administered 2020-01-07 – 2020-01-08 (×3): 1 via ORAL
  Filled 2020-01-07 (×3): qty 1

## 2020-01-07 MED ORDER — FUROSEMIDE 10 MG/ML IJ SOLN
40.0000 mg | Freq: Once | INTRAMUSCULAR | Status: AC
  Administered 2020-01-07: 14:00:00 40 mg via INTRAVENOUS
  Filled 2020-01-07: qty 4

## 2020-01-07 MED ORDER — GELCLAIR MT GEL
1.0000 | Freq: Three times a day (TID) | OROMUCOSAL | Status: DC
  Filled 2020-01-07: qty 1

## 2020-01-07 MED ORDER — SORBITOL 70 % SOLN
960.0000 mL | TOPICAL_OIL | Freq: Once | ORAL | Status: AC
  Administered 2020-01-07: 960 mL via RECTAL
  Filled 2020-01-07: qty 473

## 2020-01-07 MED ORDER — POLYETHYLENE GLYCOL 3350 17 G PO PACK
17.0000 g | PACK | Freq: Two times a day (BID) | ORAL | Status: DC
  Administered 2020-01-07: 14:00:00 17 g via ORAL
  Filled 2020-01-07: qty 1

## 2020-01-07 NOTE — Progress Notes (Signed)
Pt refuse CPT at this time due to her stomach feeling tight, pt stated she would try CPT in AM

## 2020-01-07 NOTE — Plan of Care (Signed)
?  Problem: Education: ?Goal: Ability to demonstrate management of disease process will improve ?Outcome: Progressing ?Goal: Ability to verbalize understanding of medication therapies will improve ?Outcome: Progressing ?Goal: Individualized Educational Video(s) ?Outcome: Progressing ?  ?Problem: Activity: ?Goal: Capacity to carry out activities will improve ?Outcome: Progressing ?  ?Problem: Cardiac: ?Goal: Ability to achieve and maintain adequate cardiopulmonary perfusion will improve ?Outcome: Progressing ?  ?Problem: Education: ?Goal: Knowledge of disease or condition will improve ?Outcome: Progressing ?Goal: Knowledge of the prescribed therapeutic regimen will improve ?Outcome: Progressing ?Goal: Individualized Educational Video(s) ?Outcome: Progressing ?  ?Problem: Activity: ?Goal: Ability to tolerate increased activity will improve ?Outcome: Progressing ?Goal: Will verbalize the importance of balancing activity with adequate rest periods ?Outcome: Progressing ?  ?Problem: Respiratory: ?Goal: Ability to maintain a clear airway will improve ?Outcome: Progressing ?Goal: Levels of oxygenation will improve ?Outcome: Progressing ?Goal: Ability to maintain adequate ventilation will improve ?Outcome: Progressing ?  ?

## 2020-01-07 NOTE — Progress Notes (Signed)
Occupational Therapy Treatment Patient Details Name: [name] MRN: [id number] DOB: 13-Jun-1955 Today's Date: 01/07/2020    History of present illness 64 yo female presenting with AMS, shortness of breath, cough, congestion, and fever. Tested COVID-19 negative. PMH including COPD not on oxygen, chronic pain/OA on opiate, hepatitis B, DVT/PE on Xarelto, diastolic CHF, HTN, anxiety, bipolar disorder, CKD-3 and morbid obesity.   OT comments  Pt making good progress with functional goals. Pt ambulated with no AD to bathroom for toilet transfers, toileting and stood at sink for hygiene. Initiated ADL A/E edcuation for LB selfcare and pt states that she know what the A/E is but that she is on a fixed income and is "doing ok right now" and that her daughter can help her with LEs/LB selfcare. OT will continue to follow acutely to maximize level of function and safety  Follow Up Recommendations  No OT follow up    Equipment Recommendations  Tub/shower seat;Other (comment) (reacher, LH bath sponge)    Recommendations for Other Services      Precautions / Restrictions Precautions Precautions: Fall Restrictions Weight Bearing Restrictions: No       Mobility Bed Mobility Overal bed mobility: Modified Independent             General bed mobility comments: OOB in chair upon arrival  Transfers Overall transfer level: Needs assistance Equipment used: None Transfers: Sit to/from Stand Sit to Stand: Supervision         General transfer comment: Supervision for safety    Balance Overall balance assessment: Needs assistance   Sitting balance-Leahy Scale: Good     Standing balance support: No upper extremity supported Standing balance-Leahy Scale: Good                             ADL either performed or assessed with clinical judgement   ADL Overall ADL's : Needs assistance/impaired     Grooming: Wash/dry face;Supervision/safety;Standing;Wash/dry hands        Lower Body Bathing: Supervison/ safety;Sit to/from stand       Lower Body Dressing: Minimal assistance;Sitting/lateral leans Lower Body Dressing Details (indicate cue type and reason): min A to don R slipper, pt unable to cross legs or bend forward to reach R foot. Pt able to pull up underwear standing with Sup Toilet Transfer: Supervision/safety;Ambulation;Regular Toilet   Toileting- Water quality scientist and Hygiene: Supervision/safety;Sit to/from stand       Functional mobility during ADLs: Supervision/safety General ADL Comments: initiated ADL A/E edcuation for LB selfcare and pt states that she know what the A/E is but that she is on a fixed income and is "doing ok right now" and that her daughter can help her with LEs/LB selfcare     Vision Baseline Vision/History: Wears glasses Patient Visual Report: No change from baseline     Perception     Praxis      Cognition Arousal/Alertness: Awake/alert Behavior During Therapy: WFL for tasks assessed/performed Overall Cognitive Status: Within Functional Limits for tasks assessed                                          Exercises     Shoulder Instructions       General Comments Pt on RA with sats 99% rest and 93% ambultion but DOE of 3/4.  HR 99 rest and up to 122  bpm with ambulation    Pertinent Vitals/ Pain       Pain Assessment: No/denies pain Faces Pain Scale: No hurt  Home Living                                          Prior Functioning/Environment              Frequency  Min 2X/week        Progress Toward Goals  OT Goals(current goals can now be found in the care plan section)  Progress towards OT goals: Progressing toward goals  Acute Rehab OT Goals Patient Stated Goal: Go home  Plan Discharge plan remains appropriate    Co-evaluation                 AM-PAC OT "6 Clicks" Daily Activity     Outcome Measure   Help from another person eating  meals?: None Help from another person taking care of personal grooming?: None Help from another person toileting, which includes using toliet, bedpan, or urinal?: None Help from another person bathing (including washing, rinsing, drying)?: A Little Help from another person to put on and taking off regular upper body clothing?: None Help from another person to put on and taking off regular lower body clothing?: A Little 6 Click Score: 22    End of Session    OT Visit Diagnosis: Unsteadiness on feet (R26.81);Other abnormalities of gait and mobility (R26.89);Muscle weakness (generalized) (M62.81)   Activity Tolerance Patient tolerated treatment well   Patient Left in chair;with call bell/phone within reach   Nurse Communication          Time: 9191-6606 OT Time Calculation (min): 19 min  Charges: OT General Charges $OT Visit: 1 Visit OT Treatments $Self Care/Home Management : 8-22 mins     Britt Bottom 01/07/2020, 4:16 PM

## 2020-01-07 NOTE — Telephone Encounter (Signed)
Pt called asking if her insurance [employer] covers her to get compression socks.   Pt is asking do we need to call or does she need to call regarding her socks.

## 2020-01-07 NOTE — Progress Notes (Signed)
[facility] PROGRESS NOTE    Progress Note  Danita LATONDA LARRIVEE  URK:[id number] DOB: December 15, 1955 DOA: 01/04/2020 PCP: Bernerd Limbo, MD     Brief Narrative:   Bentley GWENDLYN HANBACK is an 64 y.o. female past medical history of COPD not on oxygen, chronic pain hepatitis B DVT/PE on Xarelto chronic diastolic heart failure chronic kidney disease stage III is brought into EMS due to altered mental status cough shortness of breath and fever  Assessment/Plan:   Severe sepsis secondary possibly to aspiration pneumonia: She was started on Rocephin and azithromycin on admission due to the risk of aspiration pneumonia she was switched to Unasyn. Blood cultures have been negative till date. She has remained afebrile with no leukocytosis change Unasyn to oral Augmentin.  Acute metabolic encephalopathy: Likely due to infectious etiology now resolved.  Acute kidney injury on chronic kidney disease stage IIIa: Likely due to sepsis Baseline creatinine around 1.2 resolved with IV fluid hydration.  Elevated LFTs/hepatitis B: Only follow-up with GI as an outpatient continue entecavir.  Chronic diastolic heart failure/pulmonary hypertension: No signs of fluid overloaded resume back on her home dose of Lasix.  COPD: Continue nebs.  History of PE/DVT: Continue Xarelto.  Essential hypertension: Restart home medications.  Chronic pain syndrome/osteoarthritis: Continue current regimen.  Severe constipation: Start her on MiraLAX p.o. twice daily give her a smog enema.   DVT prophylaxis: Xarelto Family Communication:none Status is: Inpatient  Remains inpatient appropriate because:Hemodynamically unstable   Dispo: The patient is from: Home              Anticipated d/c is to: Home              Anticipated d/c date is: 1 day              Patient currently is not medically stable to d/c.        Code Status:     Code Status Orders  (From admission, onward)         Start     Ordered    01/04/20 1725  Full code  Continuous        01/04/20 1726        Code Status History    Date Active Date Inactive Code Status Order ID Comments User Context   06/17/2016 1209 06/23/2016 2033 Full Code [id number]  Jonetta Osgood, MD Inpatient   06/09/2016 1845 06/11/2016 1922 Full Code [id number]  Cristal Ford, DO Inpatient   05/31/2015 0025 06/02/2015 1747 Full Code [id number]  Etta Quill, DO ED   05/26/2015 0103 05/27/2015 1656 Full Code [id number]  Ivor Costa, MD ED   12/10/2014 1056 12/11/2014 1350 Full Code [id number]  Michael Boston, MD Inpatient   12/08/2013 2222 12/16/2013 1512 Full Code [id number]  Allyne Gee, MD Inpatient   10/26/2013 1136 11/02/2013 1606 Full Code [id number]  Minor, Grace Bushy, NP ED   10/04/2013 0342 10/14/2013 1804 Full Code [id number]  Ivor Costa, MD Inpatient   01/14/2013 1409 01/14/2013 2017 Full Code [id number]  Illene Labrador, PA-C ED   12/21/2012 1936 12/24/2012 1806 Full Code [id number]  Charlynne Cousins, MD Inpatient   11/27/2010 0614 11/30/2010 1745 Full Code [id number]  Glennon Hamilton, RN Inpatient   Advance Care Planning Activity    Advance Directive Documentation   Flowsheet Row Most Recent Value  Type of Advance Directive Healthcare Power of Attorney  Pre-existing out of facility DNR order (yellow form or pink MOST form) --  "MOST" Form in Place? --  IV Access:    Peripheral IV   Procedures and diagnostic studies:   No results found.   Medical Consultants:    None.  Anti-Infectives:   augmentin  Subjective:    Luisana TEIRRA CARAPIA she relates some abdominal pain has not had a bowel movement in 5 days.  Objective:    Vitals:   01/07/20 0436 01/07/20 0438 01/07/20 0733 01/07/20 1134  BP: 130/79   (!) 146/86  Pulse: 70   70  Resp: 19   17  Temp: 98.2 F (36.8 C)   98.5 F (36.9 C)  TempSrc: Oral   Oral  SpO2: 90%  93% 95%  Weight:  120.1 kg    Height:       SpO2: 95 % O2 Flow Rate (L/min): 3 L/min FiO2 (%): 21  %   Intake/Output Summary (Last 24 hours) at 01/07/2020 1143 Last data filed at 01/07/2020 0959 Gross per 24 hour  Intake 2614.67 ml  Output 7000 ml  Net -4385.33 ml   Filed Weights   01/05/20 0333 01/06/20 0000 01/07/20 0438  Weight: 117.6 kg 119.7 kg 120.1 kg    Exam: General exam: In no acute distress. Respiratory system: Good air movement and clear to auscultation. Cardiovascular system: S1 & S2 heard, RRR. No JVD. Gastrointestinal system: Abdomen is nondistended, soft and nontender.  Extremities: No pedal edema. Skin: No rashes, lesions or ulcers Psychiatry: Judgement and insight appear normal. Mood & affect appropriate.    Data Reviewed:    Labs: Basic Metabolic Panel: Recent Labs  Lab 01/04/20 1400 01/04/20 1416 01/05/20 0626 01/06/20 0629 01/07/20 0335  NA 137 135 138 137 138  K 4.0 3.9 3.7 3.6 3.7  CL 97*  --  99 101 103  CO2 25  --  27 25 24   GLUCOSE 122*  --  106* 107* 116*  BUN 17  --  16 11 9   CREATININE 1.75*  --  1.38* 1.19* 1.12*  CALCIUM 9.1  --  8.7* 9.2 9.1  MG  --   --  2.2  --   --   PHOS  --   --  3.5  --   --    GFR Estimated Creatinine Clearance: 65.7 mL/min (A) (by C-G formula based on SCr of 1.12 mg/dL (H)). Liver Function Tests: Recent Labs  Lab 01/04/20 1400 01/05/20 0626  AST 66* 41  ALT 50* 39  ALKPHOS 101 89  BILITOT 0.7 0.6  PROT 6.6 5.5*  ALBUMIN 3.4* 2.8*   No results for input(s): LIPASE, AMYLASE in the last 168 hours. Recent Labs  Lab 01/04/20 1400 01/05/20 0626  AMMONIA 20 17   Coagulation profile Recent Labs  Lab 01/04/20 1400  INR 2.0*   COVID-19 Labs  No results for input(s): DDIMER, FERRITIN, LDH, CRP in the last 72 hours.  Lab Results  Component Value Date   SARSCOV2NAA NEGATIVE 01/04/2020   [place] NEGATIVE 12/31/2019    CBC: Recent Labs  Lab 01/04/20 1400 01/04/20 1416 01/05/20 0626 01/06/20 0448 01/07/20 0335  WBC 10.9*  --  10.1  --   --   NEUTROABS 9.5*  --   --   --   --    HGB 11.4* 12.2 10.3* 9.8* 10.2*  HCT 36.2 36.0 33.2* 31.7* 31.5*  MCV 86.4  --  87.4  --   --   PLT 146*  --  109*  --   --    Cardiac Enzymes: Recent Labs  Lab 01/05/20 0626  CKTOTAL  488*   BNP (last 3 results) No results for input(s): PROBNP in the last 8760 hours. CBG: No results for input(s): GLUCAP in the last 168 hours. D-Dimer: No results for input(s): DDIMER in the last 72 hours. Hgb A1c: No results for input(s): HGBA1C in the last 72 hours. Lipid Profile: No results for input(s): CHOL, HDL, LDLCALC, TRIG, CHOLHDL, LDLDIRECT in the last 72 hours. Thyroid function studies: No results for input(s): TSH, T4TOTAL, T3FREE, THYROIDAB in the last 72 hours.  Invalid input(s): FREET3 Anemia work up: No results for input(s): VITAMINB12, FOLATE, FERRITIN, TIBC, IRON, RETICCTPCT in the last 72 hours. Sepsis Labs: Recent Labs  Lab 01/04/20 1400 01/04/20 1557 01/04/20 1733 01/05/20 0626 01/06/20 0448 01/06/20 0629  PROCALCITON  --   --  14.27 14.81  --  8.20  WBC 10.9*  --   --  10.1  --   --   LATICACIDVEN 2.9* 2.4*  --   --  2.1*  --    Microbiology Recent Results (from the past 240 hour(s))  Resp Panel by RT-PCR (Flu A&B, Covid) Nasopharyngeal Swab     Status: None   Collection Time: 12/31/19  1:32 AM   Specimen: Nasopharyngeal Swab; Nasopharyngeal(NP) swabs in vial transport medium  Result Value Ref Range Status   SARS Coronavirus 2 by RT PCR NEGATIVE NEGATIVE Final    Comment: (NOTE) SARS-CoV-2 target nucleic acids are NOT DETECTED.  The SARS-CoV-2 RNA is generally detectable in upper respiratory specimens during the acute phase of infection. The lowest concentration of SARS-CoV-2 viral copies this assay can detect is 138 copies/mL. A negative result does not preclude SARS-Cov-2 infection and should not be used as the sole basis for treatment or other patient management decisions. A negative result may occur with  improper specimen collection/handling,  submission of specimen other than nasopharyngeal swab, presence of viral mutation(s) within the areas targeted by this assay, and inadequate number of viral copies(<138 copies/mL). A negative result must be combined with clinical observations, patient history, and epidemiological information. The expected result is Negative.  Fact Sheet for Patients:  EntrepreneurPulse.com.au  Fact Sheet for Healthcare Providers:  IncredibleEmployment.be  This test is no t yet approved or cleared by the [place] FDA and  has been authorized for detection and/or diagnosis of SARS-CoV-2 by FDA under an Emergency Use Authorization (EUA). This EUA will remain  in effect (meaning this test can be used) for the duration of the COVID-19 declaration under Section 564(b)(1) of the Act, 21 U.S.C.section 360bbb-3(b)(1), unless the authorization is terminated  or revoked sooner.       Influenza A by PCR NEGATIVE NEGATIVE Final   Influenza B by PCR NEGATIVE NEGATIVE Final    Comment: (NOTE) The Xpert Xpress SARS-CoV-2/FLU/RSV plus assay is intended as an aid in the diagnosis of influenza from Nasopharyngeal swab specimens and should not be used as a sole basis for treatment. Nasal washings and aspirates are unacceptable for Xpert Xpress SARS-CoV-2/FLU/RSV testing.  Fact Sheet for Patients: EntrepreneurPulse.com.au  Fact Sheet for Healthcare Providers: IncredibleEmployment.be  This test is not yet approved or cleared by the [place] FDA and has been authorized for detection and/or diagnosis of SARS-CoV-2 by FDA under an Emergency Use Authorization (EUA). This EUA will remain in effect (meaning this test can be used) for the duration of the COVID-19 declaration under Section 564(b)(1) of the Act, 21 U.S.C. section 360bbb-3(b)(1), unless the authorization is terminated or revoked.  Performed at [facility], [place]  [address], [place], [place] [address]   Blood Culture (routine x 2)     Status: None (Preliminary result)   Collection Time: 01/04/20  1:47 PM   Specimen: BLOOD  Result Value Ref Range Status   Specimen Description BLOOD BLOOD LEFT HAND  Final   Special Requests   Final    BOTTLES DRAWN AEROBIC ONLY Blood Culture results may not be optimal due to an inadequate volume of blood received in culture bottles   Culture   Final    NO GROWTH 3 DAYS Performed at [facility], [place] [address] [place], [place] [address]    Report Status PENDING  Incomplete  Blood Culture (routine x 2)     Status: None (Preliminary result)   Collection Time: 01/04/20  2:00 PM   Specimen: BLOOD  Result Value Ref Range Status   Specimen Description BLOOD BLOOD RIGHT HAND  Final   Special Requests   Final    BOTTLES DRAWN AEROBIC AND ANAEROBIC Blood Culture adequate volume   Culture   Final    NO GROWTH 3 DAYS Performed at [facility], [place] [address] [place], [place] [address]    Report Status PENDING  Incomplete  Resp Panel by RT-PCR (Flu A&B, Covid) Nasopharyngeal Swab     Status: None   Collection Time: 01/04/20  2:00 PM   Specimen: Nasopharyngeal Swab; Nasopharyngeal(NP) swabs in vial transport medium  Result Value Ref Range Status   SARS Coronavirus 2 by RT PCR NEGATIVE NEGATIVE Final    Comment: (NOTE) SARS-CoV-2 target nucleic acids are NOT DETECTED.  The SARS-CoV-2 RNA is generally detectable in upper respiratory specimens during the acute phase of infection. The lowest concentration of SARS-CoV-2 viral copies this assay can detect is 138 copies/mL. A negative result does not preclude SARS-Cov-2 infection and should not be used as the sole basis for treatment or other patient management decisions. A negative result may occur with  improper specimen collection/handling, submission of specimen other than nasopharyngeal swab, presence of viral mutation(s) within the areas targeted by this  assay, and inadequate number of viral copies(<138 copies/mL). A negative result must be combined with clinical observations, patient history, and epidemiological information. The expected result is Negative.  Fact Sheet for Patients:  EntrepreneurPulse.com.au  Fact Sheet for Healthcare Providers:  IncredibleEmployment.be  This test is no t yet approved or cleared by the [place] FDA and  has been authorized for detection and/or diagnosis of SARS-CoV-2 by FDA under an Emergency Use Authorization (EUA). This EUA will remain  in effect (meaning this test can be used) for the duration of the COVID-19 declaration under Section 564(b)(1) of the Act, 21 U.S.C.section 360bbb-3(b)(1), unless the authorization is terminated  or revoked sooner.       Influenza A by PCR NEGATIVE NEGATIVE Final   Influenza B by PCR NEGATIVE NEGATIVE Final    Comment: (NOTE) The Xpert Xpress SARS-CoV-2/FLU/RSV plus assay is intended as an aid in the diagnosis of influenza from Nasopharyngeal swab specimens and should not be used as a sole basis for treatment. Nasal washings and aspirates are unacceptable for Xpert Xpress SARS-CoV-2/FLU/RSV testing.  Fact Sheet for Patients: EntrepreneurPulse.com.au  Fact Sheet for Healthcare Providers: IncredibleEmployment.be  This test is not yet approved or cleared by the [place] FDA and has been authorized for detection and/or diagnosis of SARS-CoV-2 by FDA under an Emergency Use Authorization (EUA). This EUA will remain in effect (meaning this test can be used) for the duration of the  COVID-19 declaration under Section 564(b)(1) of the Act, 21 U.S.C. section 360bbb-3(b)(1), unless the authorization is terminated or revoked.  Performed at [facility], [place] [address] [place], [place] [address]   Respiratory Panel by PCR     Status: None   Collection Time: 01/04/20  7:21 PM    Specimen: Nasopharyngeal Swab; Respiratory  Result Value Ref Range Status   Adenovirus NOT DETECTED NOT DETECTED Final   Coronavirus 229E NOT DETECTED NOT DETECTED Final    Comment: (NOTE) The Coronavirus on the Respiratory Panel, DOES NOT test for the novel  Coronavirus (2019 nCoV)    Coronavirus HKU1 NOT DETECTED NOT DETECTED Final   Coronavirus NL63 NOT DETECTED NOT DETECTED Final   Coronavirus OC43 NOT DETECTED NOT DETECTED Final   Metapneumovirus NOT DETECTED NOT DETECTED Final   Rhinovirus / Enterovirus NOT DETECTED NOT DETECTED Final   Influenza A NOT DETECTED NOT DETECTED Final   Influenza B NOT DETECTED NOT DETECTED Final   Parainfluenza Virus 1 NOT DETECTED NOT DETECTED Final   Parainfluenza Virus 2 NOT DETECTED NOT DETECTED Final   Parainfluenza Virus 3 NOT DETECTED NOT DETECTED Final   Parainfluenza Virus 4 NOT DETECTED NOT DETECTED Final   Respiratory Syncytial Virus NOT DETECTED NOT DETECTED Final   Bordetella pertussis NOT DETECTED NOT DETECTED Final   Bordetella Parapertussis NOT DETECTED NOT DETECTED Final   Chlamydophila pneumoniae NOT DETECTED NOT DETECTED Final   Mycoplasma pneumoniae NOT DETECTED NOT DETECTED Final    Comment: Performed at [facility], [place]. [address] [place], [place] [address]  Urine culture     Status: None   Collection Time: 01/04/20  9:17 PM   Specimen: Urine, Random  Result Value Ref Range Status   Specimen Description URINE, RANDOM  Final   Special Requests NONE  Final   Culture   Final    NO GROWTH Performed at [facility], [place] [address] [place], [place] [address]    Report Status 01/05/2020 FINAL  Final  Expectorated sputum assessment w rflx to resp cult     Status: None   Collection Time: 01/06/20  3:29 PM   Specimen: Expectorated Sputum  Result Value Ref Range Status   Specimen Description EXPECTORATED SPUTUM  Final   Special Requests NONE  Final   Sputum evaluation   Final    THIS SPECIMEN IS ACCEPTABLE FOR  SPUTUM CULTURE Performed at [facility], [place] [address] [place], [place] [address]    Report Status 01/06/2020 FINAL  Final  Culture, respiratory     Status: None (Preliminary result)   Collection Time: 01/06/20  3:29 PM  Result Value Ref Range Status   Specimen Description EXPECTORATED SPUTUM  Final   Special Requests NONE Reflexed from [id number]  Final   Gram Stain   Final    MODERATE WBC PRESENT,BOTH PMN AND MONONUCLEAR RARE GRAM POSITIVE COCCI FEW YEAST    Culture   Final    TOO YOUNG TO READ Performed at [facility], [place] [address] [place], [place] [address]    Report Status PENDING  Incomplete     Medications:   . acetylcysteine  2 mL Nebulization TID  . amLODipine  5 mg Oral Daily  . arformoterol  15 mcg Nebulization BID  . baclofen  20 mg Oral QHS  . budesonide (PULMICORT) nebulizer solution  0.5 mg Nebulization BID  . cholecalciferol  2,000 Units Oral Daily  . docusate sodium  100 mg  Oral BID  . entecavir  0.5 mg Oral QODAY  . eszopiclone  1 mg Oral QHS  . famotidine  20 mg Oral BID  . guaiFENesin  1,200 mg Oral BID  . Iloperidone  4 mg Oral Daily  . Methylnaltrexone Bromide  450 mg Oral Daily  . pantoprazole  40 mg Oral BID AC  . pregabalin  75 mg Oral TID  . rivaroxaban  20 mg Oral Q supper   Continuous Infusions: . ampicillin-sulbactam (UNASYN) IV 3 g (01/07/20 0759)      LOS: 3 days   Charlynne Cousins  [facility]  01/07/2020, 11:43 AM

## 2020-01-07 NOTE — Telephone Encounter (Signed)
She does.

## 2020-01-07 NOTE — Progress Notes (Signed)
Physical Therapy Treatment Patient Details Name: [name] MRN: [id number] DOB: 12/05/1955 Today's Date: 01/07/2020    History of Present Illness 63 yo female presenting with AMS, shortness of breath, cough, congestion, and fever. Tested COVID-19 negative. PMH including COPD not on oxygen, chronic pain/OA on opiate, hepatitis B, DVT/PE on Xarelto, diastolic CHF, HTN, anxiety, bipolar disorder, CKD-3 and morbid obesity.    PT Comments    Pt demonstrating gradual progress but with antalgic gait and fatigued easily.  Encouraged use of RW to assist with antalgic gait (reports needs bil TKA) and energy conservation.  Continue plan of care.    Follow Up Recommendations  No PT follow up;Supervision for mobility/OOB     Equipment Recommendations  Rolling walker with 5" wheels    Recommendations for Other Services       Precautions / Restrictions Precautions Precautions: Fall    Mobility  Bed Mobility Overal bed mobility: Modified Independent                Transfers Overall transfer level: Needs assistance Equipment used: None Transfers: Sit to/from Stand Sit to Stand: Supervision         General transfer comment: Supervision for safety  Ambulation/Gait Ambulation/Gait assistance: Supervision;Min guard Gait Distance (Feet): 200 Feet Assistive device: None Gait Pattern/deviations: Step-to pattern;Decreased stride length;Wide base of support;Antalgic Gait velocity: decreased   General Gait Details: 1 standing rest break; pt was steady but with antalgic gait and fatigued - encouraged use of RW   Stairs             Wheelchair Mobility    Modified Rankin (Stroke Patients Only)       Balance Overall balance assessment: Needs assistance   Sitting balance-Leahy Scale: Normal     Standing balance support: No upper extremity supported Standing balance-Leahy Scale: Good                              Cognition Arousal/Alertness:  Awake/alert Behavior During Therapy: WFL for tasks assessed/performed Overall Cognitive Status: Within Functional Limits for tasks assessed                                        Exercises      General Comments General comments (skin integrity, edema, etc.): Pt on RA with sats 99% rest and 93% ambultion but DOE of 3/4.  HR 99 rest and up to 122 bpm with ambulation      Pertinent Vitals/Pain Pain Assessment: No/denies pain    Home Living                      Prior Function            PT Goals (current goals can now be found in the care plan section) Acute Rehab PT Goals Patient Stated Goal: Go home PT Goal Formulation: With patient Time For Goal Achievement: 01/19/20 Potential to Achieve Goals: Good Progress towards PT goals: Progressing toward goals    Frequency    Min 3X/week      PT Plan Current plan remains appropriate    Co-evaluation              AM-PAC PT "6 Clicks" Mobility   Outcome Measure  Help needed turning from your back to your side while in a flat bed without using bedrails?: None  Help needed moving from lying on your back to sitting on the side of a flat bed without using bedrails?: None Help needed moving to and from a bed to a chair (including a wheelchair)?: None Help needed standing up from a chair using your arms (e.g., wheelchair or bedside chair)?: A Little Help needed to walk in hospital room?: A Little Help needed climbing 3-5 steps with a railing? : A Little 6 Click Score: 21    End of Session   Activity Tolerance: Patient tolerated treatment well Patient left: with call bell/phone within reach;in chair Nurse Communication: Mobility status PT Visit Diagnosis: Unsteadiness on feet (R26.81);Difficulty in walking, not elsewhere classified (R26.2)     Time: 7282-0601 PT Time Calculation (min) (ACUTE ONLY): 20 min  Charges:  $Gait Training: 8-22 mins                     Abran Richard, PT Acute Rehab  Services Pager [phone] [facility] 01/07/2020, 3:47 PM

## 2020-01-07 NOTE — Progress Notes (Signed)
Patient took po laxatives today went in to do SMOG enema she stated has nausea too sick to do enema may do that later. She is resting at present. She is sitting up in geri chair at present. I received call that patient has exceed her diet limits this am with breakfast alone,she had diet change today that has effected her meal orders the rest of the day. I alerted MD that we would restrict diet as much as possible but patient states still has to eat today. No further changes noted.

## 2020-01-08 ENCOUNTER — Other Ambulatory Visit (HOSPITAL_COMMUNITY): Payer: Self-pay | Admitting: Internal Medicine

## 2020-01-08 MED ORDER — AMOXICILLIN-POT CLAVULANATE 875-125 MG PO TABS: 1.0000 | ORAL_TABLET | Freq: Two times a day (BID) | ORAL | 0 refills | Status: DC

## 2020-01-08 MED FILL — AMOX-CLAV 875-125 MG TABLET: 875-125 | 2 days supply | Qty: 4 | Fill #0

## 2020-01-08 NOTE — Progress Notes (Signed)
Home medications given to pt. from pharmacy.

## 2020-01-08 NOTE — Progress Notes (Signed)
Occupational Therapy Treatment Patient Details Name: [name] MRN: [id number] DOB: [birth year] Today's Date: 01/08/2020    History of present illness 64 yo female presenting with AMS, shortness of breath, cough, congestion, and fever. Tested COVID-19 negative. PMH including COPD not on oxygen, chronic pain/OA on opiate, hepatitis B, DVT/PE on Xarelto, diastolic CHF, HTN, anxiety, bipolar disorder, CKD-3 and morbid obesity.   OT comments  Pt educated and provided AE for LB ADL. Educated in importance of safe footwear. Pt verbalized and/or demonstrated understanding.   Follow Up Recommendations  No OT follow up    Equipment Recommendations   (pt is declining DME)    Recommendations for Other Services      Precautions / Restrictions Precautions Precautions: None Restrictions Weight Bearing Restrictions: No       Mobility Bed Mobility               General bed mobility comments: OOB in chair upon arrival  Transfers Overall transfer level: Modified independent Equipment used: None                  Balance Overall balance assessment: Needs assistance   Sitting balance-Leahy Scale: Good     Standing balance support: No upper extremity supported Standing balance-Leahy Scale: Good                             ADL either performed or assessed with clinical judgement   ADL Overall ADL's : Needs assistance/impaired               Lower Body Bathing Details (indicate cue type and reason): educated pt in use of long handled bath sponge and reacher     Lower Body Dressing: Set up;Sitting/lateral leans Lower Body Dressing Details (indicate cue type and reason): educated in use of sock aide, reacher and long shoe horn. recommended elastic laces and more structured shoes Toilet Transfer: Regular Toilet;Modified Independent;Ambulation   Toileting- Clothing Manipulation and Hygiene: Modified independent               Vision        Perception     Praxis      Cognition Arousal/Alertness: Awake/alert Behavior During Therapy: Anxious Overall Cognitive Status: Within Functional Limits for tasks assessed                                 General Comments: needs repetition due to anxiety        Exercises     Shoulder Instructions       General Comments      Pertinent Vitals/ Pain       Pain Assessment: Faces Faces Pain Scale: No hurt  Home Living                                          Prior Functioning/Environment              Frequency  Min 2X/week        Progress Toward Goals  OT Goals(current goals can now be found in the care plan section)  Progress towards OT goals: Progressing toward goals  Acute Rehab OT Goals Patient Stated Goal: Go home OT Goal Formulation: With patient Time For Goal Achievement: 01/19/20 Potential to Achieve Goals: Good  Plan  Discharge plan remains appropriate    Co-evaluation                 AM-PAC OT "6 Clicks" Daily Activity     Outcome Measure   Help from another person eating meals?: None Help from another person taking care of personal grooming?: None Help from another person toileting, which includes using toliet, bedpan, or urinal?: None Help from another person bathing (including washing, rinsing, drying)?: None Help from another person to put on and taking off regular upper body clothing?: None Help from another person to put on and taking off regular lower body clothing?: None 6 Click Score: 24    End of Session    OT Visit Diagnosis: Unsteadiness on feet (R26.81);Other abnormalities of gait and mobility (R26.89);Muscle weakness (generalized) (M62.81)   Activity Tolerance Patient tolerated treatment well   Patient Left in chair;with call bell/phone within reach   Nurse Communication          Time: 0511-0211 OT Time Calculation (min): 26 min  Charges: OT General Charges $OT Visit: 1  Visit OT Treatments $Self Care/Home Management : 23-37 mins  Nestor Lewandowsky, OTR/L Acute Rehabilitation Services Pager: [phone] Office: [phone]   Malka So 01/08/2020, 10:03 AM

## 2020-01-08 NOTE — TOC Initial Note (Signed)
Transition of Care [facility]) - Initial/Assessment Note    Patient Details  Name: [name] MRN: [id number] Date of Birth: 06-07-1955  [facility] CM/SW Contact:    Zenon Mayo, RN Phone Number: 01/08/2020, 8:57 AM  Clinical Narrative:                 NCM spoke with patient, asked if she wanted a rolling walker, she states she uses a cane at home so she does not want a rolling walker, also asked if she wanted the shower chair, she stated she does not want a shower chair.  NCM informed her that she could get a reacher from [employer] the [employer].  She states she does not really need one.  She has transportation at [employer], she states her daughter works  And will pick her up later this evening when she gets off work.    Expected Discharge Plan: Home/Self Care Barriers to Discharge: No Barriers Identified   Patient Goals and CMS Choice Patient states their goals for this hospitalization and ongoing recovery are:: to do better with her diet   Choice offered to / list presented to : NA  Expected Discharge Plan and Services Expected Discharge Plan: Home/Self Care   Discharge Planning Services: CM Consult Post Acute Care Choice: NA Living arrangements for the past 2 months: Single Family Home Expected Discharge Date: 01/08/20                 DME Agency: NA       HH Arranged: NA          Prior Living Arrangements/Services Living arrangements for the past 2 months: Single Family Home Lives with:: Adult Children Patient language and need for interpreter reviewed:: Yes Do you feel safe going back to the place where you live?: Yes      Need for Family Participation in Patient Care: Yes (Comment) Care giver support system in place?: Yes (comment)   Criminal Activity/Legal Involvement Pertinent to Current Situation/Hospitalization: No - Comment as needed  Activities of Daily Living Home Assistive Devices/Equipment: None ADL Screening (condition at time of  admission) Patient's cognitive ability adequate to safely complete daily activities?: Yes Is the patient deaf or have difficulty hearing?: No Does the patient have difficulty seeing, even when wearing glasses/contacts?: No Does the patient have difficulty concentrating, remembering, or making decisions?: Yes Patient able to express need for assistance with ADLs?: Yes Does the patient have difficulty dressing or bathing?: No Independently performs ADLs?: Yes (appropriate for developmental age) (At baseline she is completely independent) Does the patient have difficulty walking or climbing stairs?: No Weakness of Legs: None Weakness of Arms/Hands: None  Permission Sought/Granted                  Emotional Assessment   Attitude/Demeanor/Rapport: Engaged Affect (typically observed): Appropriate Orientation: : Oriented to Self,Oriented to Place,Oriented to  Time,Oriented to Situation Alcohol / Substance Use: Not Applicable    Admission diagnosis:  Shortness of breath [R06.02] Severe sepsis ([place]) [A41.9, R65.20] Severe sepsis with acute organ dysfunction ([place]) [A41.9, R65.20] Community acquired pneumonia of left lower lobe of lung [J18.9] Patient Active Problem List   Diagnosis Date Noted  . Severe sepsis with acute organ dysfunction ([place]) 01/04/2020  . Primary osteoarthritis of right knee 12/04/2019  . Primary osteoarthritis of left knee 12/04/2019  . Vitamin D deficiency 09/20/2019  . Cellulitis of foot 09/18/2019  . Aspiration pneumonia of both lower lobes due to gastric  secretions ([place]) 06/02/2019  . Lumbar disc herniation 05/04/2019  . Encounter for colorectal cancer screening 04/19/2019  . Acute respiratory distress 04/06/2019  . Hyperglycemia 04/06/2019  . Pleural effusion on right 04/06/2019  . Benzodiazepine dependence ([place]) 02/16/2019  . Cervical stenosis of spinal canal 02/15/2019  . Streptococcal sepsis ([place]) 01/22/2019  . Acute bronchitis due to Rhinovirus  11/06/2018  . Lung collapse 11/01/2018  . 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 Compression fracture of body of thoracic vertebra ([place]) 08/01/2018  . Closed nondisplaced fracture of greater tuberosity of right humerus 07/08/2018  . On rivaroxaban therapy 07/08/2018  . Chronic anticoagulation 05/27/2018  . Falls frequently 05/27/2018  . History of DVT (deep vein thrombosis) 03/16/2018  . History of pulmonary embolism 03/10/2018  . Left lower quadrant abdominal pain 03/10/2018  . Chronic pain syndrome 03/05/2018  . COPD exacerbation ([place]) 06/09/2016  . Abnormal urine odor 09/26/2015  . Recurrent pulmonary embolism ([place]) 06/26/2015  . Lupus anticoagulant positive 06/26/2015  . Thrombocytopenia ([place]) 05/31/2015  . UTI (lower urinary tract infection) 05/26/2015  . Right flank pain   . Patchy loss of hair 03/08/2015  . Primary osteoarthritis of both knees 02/25/2015  . Incarcerated incisional hernia s/p lap repair w mesh 12/10/2014 12/10/2014  . DVT (deep venous thrombosis) ([place]) 10/31/2013  . Pulmonary embolism ([place]) 10/26/2013  . Kidney stone 10/04/2013  . Tobacco abuse 10/04/2013  . COPD (chronic obstructive pulmonary disease) ([place])   . Unspecified constipation 12/23/2012  . CKD (chronic kidney disease), stage III ([place]) 12/21/2012  . Allergic rhinitis 06/29/2012  . Chronic low back pain 06/29/2012  . Dysuria 06/29/2012  . Edema 06/29/2012  . Gastro-esophageal reflux disease without esophagitis 06/29/2012  . Hemorrhoids 06/29/2012  . Hirsutism 06/29/2012  . Insomnia 06/29/2012  . Localized superficial swelling, mass, or lump 06/29/2012  . Urinary incontinence 06/29/2012  . Skin sensation disturbance 06/29/2012  . Shortness of breath 06/29/2012  . Pulmonary embolism and infarction ([place]) 06/29/2012  . Proteinuria 06/29/2012  . Nondependent cannabis abuse 06/29/2012  . Acquired cyst of kidney 10/24/2011  . Neoplasm of connective  tissue 01/06/2011  . Cocaine abuse ([place]) 11/27/2010  . Depressive disorder 09/23/2009  . Ascites 09/23/2009  . Obesity 09/01/2008  . Hepatitis B virus infection 08/31/2008  . Acute hepatitis C virus infection 08/31/2008  . Iron deficiency anemia 08/31/2008  . Anxiety state 08/31/2008  . Essential hypertension 08/31/2008  . Coronary atherosclerosis 08/31/2008  . Hepatic cirrhosis (HCC) 08/31/2008   PCP:  Bernerd Limbo, MD Pharmacy:   [facility] [address] [place] [address] Phone: [phone] Fax: [phone]     Social Determinants of Health (SDOH) Interventions    Readmission Risk Interventions Readmission Risk Prevention Plan 01/08/2020  Transportation Screening Complete  PCP or Specialist Appt within 3-5 Days Complete  HRI or [place] Complete  Social Work Consult for [place] Planning/Counseling Complete  Palliative Care Screening Not Applicable  Medication Review Press photographer) Complete  Some recent data might be hidden

## 2020-01-08 NOTE — Progress Notes (Signed)
D/C instructions given and reviewed. Questions asked and answered. [facility] pharmacy notified that pt would like her antibiotic filled by them. Tele and IV removed, tolerated well. States her ride will not be here until 6pm and is refusing [facility] transport. Also states she isn't sure about her bowels since she has been having loose stools, if she would be able to go home. Assured pt we could provide brief for car ride.

## 2020-01-08 NOTE — Discharge Summary (Signed)
Physician Discharge Summary  [name]:[id number] DOB: 1955/12/29 DOA: 01/04/2020  PCP: Bernerd Limbo, MD  Admit date: 01/04/2020 Discharge date: 01/08/2020  Admitted From: Home Disposition:  Home  Recommendations for Outpatient Follow-up:  1. Follow up with PCP in 1-2 weeks 2. Please obtain BMP/CBC in one week 3. She will go home with physical therapy.  Home Health:Yes Equipment/Devices: Rolling walker  Discharge Condition:stable CODE STATUS:Full Diet recommendation: Heart Healthy  Brief/Interim Summary: 64 y.o. female past medical history of COPD not on oxygen, chronic pain hepatitis B DVT/PE on Xarelto chronic diastolic heart failure chronic kidney disease stage III is brought into EMS due to altered mental status cough shortness of breath and fever  Discharge Diagnoses:  Active Problems:   Shortness of breath   Severe sepsis with acute organ dysfunction (HCC)  Severe sepsis with acute organ dysfunction possibly due to aspiration pneumonia: She was started on IV Rocephin and azithromycin on admission, she was changed to IV Unasyn due to the high risk of aspiration. Blood cultures remain negative. She stabilized her renal function improved. She will continue oral Augmentin for 2 days post discharge.  Acute metabolic encephalopathy: Likely due to infectious etiology now resolved.  Acute kidney injury on chronic kidney disease stage IIIa: Likely due to sepsis resolved with IV fluid hydration.  Elevated LFTs/hepatitis B: No change made to his medication follow-up with GI as an outpatient.  Chronic diastolic heart failure/pulmonary hypertension:*No signs of fluid overload no changes made to her medication.  COPD: Continue duo nebs.  History of PE/DVT: No change made to her medication continue Xarelto.  Essential hypertension: Initially her antihypertensive medications were held on admission they will be resumed as an outpatient.  Severe constipation: She  related she did have a bowel movement in over 5 to 7 days she was started on MiraLAX and a smog enema and she had over 5 hard bowel movements in house she will continue MiraLAX as an outpatient as needed.  Discharge Instructions  Discharge Instructions    Diet - low sodium heart healthy   Complete by: As directed    For home use only DME 4 wheeled rolling walker with seat   Complete by: As directed    Patient needs a walker to treat with the following condition: Aspiration pneumonia ([place])   Increase activity slowly   Complete by: As directed      Allergies as of 01/08/2020      Reactions   Amitriptyline Hcl Other (See Comments)   Causes her to be very "disoriented".   Fentanyl Other (See Comments)   Sees things Pt stated had hallucinations "d/t taking high dosage, but can tolerate low dosage"   Gabapentin Rash, Other (See Comments)   REACTION: rash in mouth   Hydromorphone Hcl Nausea And Vomiting   Cymbalta [duloxetine Hcl] Other (See Comments)   Headaches   Dilaudid [hydromorphone] Nausea And Vomiting   Neomycin-bacitracin Zn-polymyx Rash      Medication List    TAKE these medications   albuterol 108 (90 Base) MCG/ACT inhaler Commonly known as: ProAir HFA INHALE 2 PUFFS INTO THE LUNGS EVERY 4 HOURS AS NEEDED FOR WHEEZING What changed:   how much to take  how to take this  when to take this  reasons to take this  additional instructions   albuterol (2.5 MG/3ML) 0.083% nebulizer solution Commonly known as: PROVENTIL Take 3 mLs (2.5 mg total) by nebulization every 4 (four) hours as needed for wheezing or shortness of breath. What  changed: Another medication with the same name was changed. Make sure you understand how and when to take each.   ALPRAZolam 1 MG tablet Commonly known as: XANAX Take 0.5 tablets (0.5 mg total) by mouth 2 (two) times daily as needed for anxiety. What changed:   how much to take  when to take this   amLODipine 5 MG tablet Commonly  known as: NORVASC Take 5 mg by mouth daily.   amoxicillin-clavulanate 875-125 MG tablet Commonly known as: Augmentin Take 1 tablet by mouth 2 (two) times daily for 2 days. Through 01/11/2020   ascorbic acid 500 MG tablet Commonly known as: VITAMIN C Take 500 mg by mouth daily.   baclofen 20 MG tablet Commonly known as: LIORESAL Take 20 mg by mouth at bedtime.   bisacodyl 5 MG EC tablet Commonly known as: DULCOLAX Take 10 mg by mouth daily as needed for mild constipation.   budesonide-formoterol 160-4.5 MCG/ACT inhaler Commonly known as: SYMBICORT Inhale 2 puffs into the lungs 2 (two) times daily.   dicyclomine 20 MG tablet Commonly known as: BENTYL Take by mouth.   docusate sodium 100 MG capsule Commonly known as: COLACE Take 100 mg by mouth daily as needed for mild constipation.   doxepin 25 MG capsule Commonly known as: SINEQUAN Take 25 mg by mouth at bedtime as needed.   entecavir 0.5 MG tablet Commonly known as: BARACLUDE Take 0.5 mg by mouth every other day.   ergocalciferol 1.25 MG (50000 UT) capsule Commonly known as: VITAMIN D2 Take by mouth.   eszopiclone 3 MG Tabs Generic drug: Eszopiclone Take 3 mg by mouth at bedtime. Take immediately before bedtime   famotidine 20 MG tablet Commonly known as: PEPCID Take 20 mg by mouth 2 (two) times daily.   Fanapt 2 MG Tabs Generic drug: Iloperidone Take 2-4 mg by mouth See admin instructions. Take 2 mg by mouth in the morning for 7 days, then increase to 4 mg thereafter   furosemide 20 MG tablet Commonly known as: LASIX Take one tablet twice daily for 3 days then one tablet daily after that until gone. What changed:   how much to take  how to take this  when to take this  reasons to take this  additional instructions   guaiFENesin 600 MG 12 hr tablet Commonly known as: Mucinex Take 1 tablet (600 mg total) by mouth 2 (two) times daily as needed. What changed: reasons to take this   ibuprofen 800  MG tablet Commonly known as: ADVIL Take 1 tablet (800 mg total) by mouth 3 (three) times daily.   levocetirizine 5 MG tablet Commonly known as: XYZAL Take 5 mg by mouth every evening.   Magnesium 100 MG Caps Take 100 mg by mouth daily as needed (nerve pain).   magnesium hydroxide 400 MG/5ML suspension Commonly known as: MILK OF MAGNESIA Take 30 mLs by mouth daily as needed for mild constipation.   montelukast 10 MG tablet Commonly known as: SINGULAIR Take 10 mg by mouth at bedtime.   Multivitamin Women 50+ Tabs Take 1 tablet by mouth daily.   Narcan 4 MG/0.1ML Liqd nasal spray kit Generic drug: naloxone Place 4 mg into the nose once as needed (for accidental overdose).   ondansetron 8 MG disintegrating tablet Commonly known as: ZOFRAN-ODT Take 8 mg by mouth every 8 (eight) hours as needed for nausea or vomiting (dissolve orally).   Oxycodone HCl 10 MG Tabs Take 10 mg by mouth See admin instructions. Take 10 mg  by mouth every four to six hours as needed for pain   pantoprazole 40 MG tablet Commonly known as: PROTONIX Take 40 mg by mouth 2 (two) times daily before a meal.   polyethylene glycol powder 17 GM/SCOOP powder Commonly known as: GLYCOLAX/MIRALAX Take 17 g by mouth daily as needed for mild constipation or moderate constipation (MIX AND DRINK).   Potassium 99 MG Tabs Take 99 mg by mouth daily.   pregabalin 75 MG capsule Commonly known as: LYRICA Take 75 mg by mouth 3 (three) times daily.   Relistor 150 MG Tabs Generic drug: Methylnaltrexone Bromide Take 450 mg by mouth daily.   rivaroxaban 20 MG Tabs tablet Commonly known as: XARELTO Take 20 mg by mouth daily.   sucralfate 1 g tablet Commonly known as: CARAFATE Take 1 g by mouth 4 (four) times daily.   valACYclovir 500 MG tablet Commonly known as: VALTREX Take 500 mg by mouth 2 (two) times daily.   Vitamin D3 50 MCG (2000 UT) Tabs Take 2,000 Units by mouth daily.            Durable Medical  Equipment  (From admission, onward)         Start     Ordered   01/08/20 0000  For home use only DME 4 wheeled rolling walker with seat       Question:  Patient needs a walker to treat with the following condition  Answer:  Aspiration pneumonia ([place])   01/08/20 0749          Allergies  Allergen Reactions  . Amitriptyline Hcl Other (See Comments)     Causes her to be very "disoriented".  . Fentanyl Other (See Comments)    Sees things Pt stated had hallucinations "d/t taking high dosage, but can tolerate low dosage"   . Gabapentin Rash and Other (See Comments)    REACTION: rash in mouth   . Hydromorphone Hcl Nausea And Vomiting  . Cymbalta [Duloxetine Hcl] Other (See Comments)    Headaches   . Dilaudid [Hydromorphone] Nausea And Vomiting  . Neomycin-Bacitracin Zn-Polymyx Rash    Consultations: None  Procedures/Studies: DG Lumbar Spine Complete  Result Date: 12/31/2019 CLINICAL DATA:  Fall EXAM: LUMBAR SPINE - COMPLETE 4+ VIEW COMPARISON:  None. FINDINGS: Diffuse degenerative disc and facet disease. Normal alignment. No fracture. SI joints symmetric and unremarkable. IMPRESSION: Diffuse degenerative disc and facet disease. No acute bony abnormality. Electronically Signed   By: Rolm Baptise M.D.   On: 12/31/2019 10:10   CT Head Wo Contrast  Result Date: 01/04/2020 CLINICAL DATA:  Mental status change. Code sepsis. EXAM: CT HEAD WITHOUT CONTRAST TECHNIQUE: Contiguous axial images were obtained from the base of the skull through the vertex without intravenous contrast. COMPARISON:  Head CT 07/14/2018 at [facility]: Brain: Brain volume is normal for age. No intracranial hemorrhage, mass effect, or midline shift. Mild periventricular chronic small vessel ischemia. No hydrocephalus. The basilar cisterns are patent. No evidence of territorial infarct or acute ischemia. No extra-axial or intracranial fluid collection. Vascular: No hyperdense vessel or unexpected  calcification. Skull: No fracture or focal lesion. Sinuses/Orbits: Paranasal sinuses and mastoid air cells are clear. The visualized orbits are unremarkable. Other: None. IMPRESSION: 1. No acute intracranial abnormality. 2. Mild chronic small vessel ischemia. Electronically Signed   By: Keith Rake M.D.   On: 01/04/2020 15:52   CT ANGIO CHEST PE W OR WO CONTRAST  Result Date: 12/31/2019 CLINICAL DATA:  Positive D-dimer.  Shortness of  breath. EXAM: CT ANGIOGRAPHY CHEST WITH CONTRAST TECHNIQUE: Multidetector CT imaging of the chest was performed using the standard protocol during bolus administration of intravenous contrast. Multiplanar CT image reconstructions and MIPs were obtained to evaluate the vascular anatomy. CONTRAST:  52m OMNIPAQUE IOHEXOL 350 MG/ML SOLN COMPARISON:  Chest radiography same day.  Previous CT 01/01/2016. FINDINGS: Cardiovascular: Pulmonary arterial opacification is excellent. Pulmonary arteries are enlarged consistent with pulmonary arterial hypertension. No filling defect seen to indicate pulmonary embolism. Heart size upper limits of normal. No pericardial fluid. No visible coronary artery calcification or aortic atherosclerotic calcification. Mediastinum/Nodes: No mass or lymphadenopathy. Lungs/Pleura: Chronic scarring at the lung apices right more than left. Mild chronic scarring at the lung bases. Slightly more chronic scarring or active atelectasis at the right lung base compared to the comparison study. No pleural effusion. Upper Abdomen: Cirrhosis of the liver.  No acute finding otherwise. Musculoskeletal: Ordinary thoracic degenerative changes. Review of the MIP images confirms the above findings. IMPRESSION: 1. No pulmonary embolism. Enlarged pulmonary arteries, left more than right, consistent with pulmonary arterial hypertension. 2. Chronic scarring at the lung apices right more than left. Slightly more chronic scarring or active atelectasis at the right lung base  compared to the comparison study. 3. Cirrhosis of the liver. Electronically Signed   By: MNelson ChimesM.D.   On: 12/31/2019 11:56   DG Chest Port 1 View  Result Date: 01/04/2020 CLINICAL DATA:  Questionable sepsis. Weakness. EXAM: PORTABLE CHEST 1 VIEW COMPARISON:  Chest radiograph and CTA 4 days ago 12/31/2019 FINDINGS: Lower lung volumes from prior exam with crowding of bronchovascular structures. Cardiomegaly is stable. Aortic tortuosity and atherosclerosis. Progressive peribronchial thickening. Mild patchy opacity at the left lung base. No significant pleural effusion. No pneumothorax. Grossly stable osseous structures. IMPRESSION: 1. Patchy opacity at the left lung base, increased over the past 4 days may be atelectasis or pneumonia. 2. Progressive peribronchial thickening may be bronchitic or pulmonary edema. Stable cardiomegaly. Electronically Signed   By: MKeith RakeM.D.   On: 01/04/2020 15:14   DG Chest Portable 1 View  Result Date: 12/31/2019 CLINICAL DATA:  Cough and shortness of breath EXAM: PORTABLE CHEST 1 VIEW COMPARISON:  None. FINDINGS: The heart size and mediastinal contours are mildly enlarged. Both lungs are clear. The visualized skeletal structures are unremarkable. IMPRESSION: No active disease. Electronically Signed   By: BPrudencio PairM.D.   On: 12/31/2019 01:54   VAS [place]OWER EXTREMITY VENOUS (DVT) (ONLY [facility] & [facility])  Result Date: 12/31/2019  Lower Venous DVT Study Indications: Swelling, and history of PE and DVT.  Limitations: Poor ultrasound/tissue interface. Comparison Study: 05-26-2015 Prior bilateral venous available for comparison. Performing Technologist: RDarlin CocoRDMS  Examination Guidelines: A complete evaluation includes B-mode imaging, spectral Doppler, color Doppler, and power Doppler as needed of all accessible portions of each vessel. Bilateral testing is considered an integral part of a complete examination. Limited examinations for reoccurring indications  may be performed as noted. The reflux portion of the exam is performed with the patient in reverse Trendelenburg.  +---------+---------------+---------+-----------+----------+-------------------+ RIGHT    CompressibilityPhasicitySpontaneityPropertiesThrombus Aging      +---------+---------------+---------+-----------+----------+-------------------+ CFV      Full           Yes      Yes                                      +---------+---------------+---------+-----------+----------+-------------------+  SFJ      Full                                                             +---------+---------------+---------+-----------+----------+-------------------+ FV Prox  Full                                                             +---------+---------------+---------+-----------+----------+-------------------+ FV Mid   Full                                                             +---------+---------------+---------+-----------+----------+-------------------+ FV DistalFull                                                             +---------+---------------+---------+-----------+----------+-------------------+ PFV      Full                                                             +---------+---------------+---------+-----------+----------+-------------------+ POP      Full           Yes      Yes                                      +---------+---------------+---------+-----------+----------+-------------------+ PTV      Full                                                             +---------+---------------+---------+-----------+----------+-------------------+ PERO     Full                                         Not well visualized +---------+---------------+---------+-----------+----------+-------------------+   +---------+---------------+---------+-----------+----------+-------------------+ LEFT      CompressibilityPhasicitySpontaneityPropertiesThrombus Aging      +---------+---------------+---------+-----------+----------+-------------------+ CFV      Full           Yes      Yes                                      +---------+---------------+---------+-----------+----------+-------------------+ SFJ      Full                                                             +---------+---------------+---------+-----------+----------+-------------------+  FV Prox  Full                                                             +---------+---------------+---------+-----------+----------+-------------------+ FV Mid   Full                                                             +---------+---------------+---------+-----------+----------+-------------------+ FV DistalFull                                                             +---------+---------------+---------+-----------+----------+-------------------+ PFV      Full                                                             +---------+---------------+---------+-----------+----------+-------------------+ POP      Full           Yes      Yes                                      +---------+---------------+---------+-----------+----------+-------------------+ PTV      Full                                                             +---------+---------------+---------+-----------+----------+-------------------+ PERO     Full                                         Not well visualized +---------+---------------+---------+-----------+----------+-------------------+     Summary: RIGHT: - There is no evidence of deep vein thrombosis in the lower extremity. However, portions of this examination were limited- see technologist comments above.  - No cystic structure found in the popliteal fossa.  LEFT: - There is no evidence of deep vein thrombosis in the lower extremity. However, portions of this  examination were limited- see technologist comments above.  - No cystic structure found in the popliteal fossa.  *See table(s) above for measurements and observations. Electronically signed by Servando Snare MD on 12/31/2019 at 3:12:34 PM.    Final      Subjective: She related she had several bowel movements overnight no further complaints.  Discharge Exam: Vitals:   01/07/20 2314 01/08/20 0725  BP: 125/81   Pulse: 99   Resp: 18   Temp: 97.7 F (36.5 C)   SpO2: 97% 92%   Vitals:   01/07/20 2136 01/07/20  2314 01/08/20 0234 01/08/20 0725  BP: 136/84 125/81    Pulse: (!) 106 99    Resp: 18 18    Temp: 98.3 F (36.8 C) 97.7 F (36.5 C)    TempSrc: Oral Oral    SpO2: 95% 97%  92%  Weight:   117.5 kg   Height:        General: Pt is alert, awake, not in acute distress Cardiovascular: RRR, S1/S2 +, no rubs, no gallops Respiratory: CTA bilaterally, no wheezing, no rhonchi Abdominal: Soft, NT, ND, bowel sounds + Extremities: no edema, no cyanosis    The results of significant diagnostics from this hospitalization (including imaging, microbiology, ancillary and laboratory) are listed below for reference.     Microbiology: Recent Results (from the past 240 hour(s))  Resp Panel by RT-PCR (Flu A&B, Covid) Nasopharyngeal Swab     Status: None   Collection Time: 12/31/19  1:32 AM   Specimen: Nasopharyngeal Swab; Nasopharyngeal(NP) swabs in vial transport medium  Result Value Ref Range Status   SARS Coronavirus 2 by RT PCR NEGATIVE NEGATIVE Final    Comment: (NOTE) SARS-CoV-2 target nucleic acids are NOT DETECTED.  The SARS-CoV-2 RNA is generally detectable in upper respiratory specimens during the acute phase of infection. The lowest concentration of SARS-CoV-2 viral copies this assay can detect is 138 copies/mL. A negative result does not preclude SARS-Cov-2 infection and should not be used as the sole basis for treatment or other patient management decisions. A negative result  may occur with  improper specimen collection/handling, submission of specimen other than nasopharyngeal swab, presence of viral mutation(s) within the areas targeted by this assay, and inadequate number of viral copies(<138 copies/mL). A negative result must be combined with clinical observations, patient history, and epidemiological information. The expected result is Negative.  Fact Sheet for Patients:  EntrepreneurPulse.com.au  Fact Sheet for Healthcare Providers:  IncredibleEmployment.be  This test is no t yet approved or cleared by the [place] FDA and  has been authorized for detection and/or diagnosis of SARS-CoV-2 by FDA under an Emergency Use Authorization (EUA). This EUA will remain  in effect (meaning this test can be used) for the duration of the COVID-19 declaration under Section 564(b)(1) of the Act, 21 U.S.C.section 360bbb-3(b)(1), unless the authorization is terminated  or revoked sooner.       Influenza A by PCR NEGATIVE NEGATIVE Final   Influenza B by PCR NEGATIVE NEGATIVE Final    Comment: (NOTE) The Xpert Xpress SARS-CoV-2/FLU/RSV plus assay is intended as an aid in the diagnosis of influenza from Nasopharyngeal swab specimens and should not be used as a sole basis for treatment. Nasal washings and aspirates are unacceptable for Xpert Xpress SARS-CoV-2/FLU/RSV testing.  Fact Sheet for Patients: EntrepreneurPulse.com.au  Fact Sheet for Healthcare Providers: IncredibleEmployment.be  This test is not yet approved or cleared by the [place] FDA and has been authorized for detection and/or diagnosis of SARS-CoV-2 by FDA under an Emergency Use Authorization (EUA). This EUA will remain in effect (meaning this test can be used) for the duration of the COVID-19 declaration under Section 564(b)(1) of the Act, 21 U.S.C. section 360bbb-3(b)(1), unless the authorization is terminated  or revoked.  Performed at [facility], [place] [address] [place], [place] [address]   Blood Culture (routine x 2)     Status: None (Preliminary result)   Collection Time: 01/04/20  1:47 PM   Specimen: BLOOD  Result Value Ref Range Status   Specimen Description BLOOD BLOOD LEFT  HAND  Final   Special Requests   Final    BOTTLES DRAWN AEROBIC ONLY Blood Culture results may not be optimal due to an inadequate volume of blood received in culture bottles   Culture   Final    NO GROWTH 3 DAYS Performed at [facility], [place] [address], [place], [place] [address]    Report Status PENDING  Incomplete  Blood Culture (routine x 2)     Status: None (Preliminary result)   Collection Time: 01/04/20  2:00 PM   Specimen: BLOOD  Result Value Ref Range Status   Specimen Description BLOOD BLOOD RIGHT HAND  Final   Special Requests   Final    BOTTLES DRAWN AEROBIC AND ANAEROBIC Blood Culture adequate volume   Culture   Final    NO GROWTH 3 DAYS Performed at [facility], [place] [address], [place], [place] [address]    Report Status PENDING  Incomplete  Resp Panel by RT-PCR (Flu A&B, Covid) Nasopharyngeal Swab     Status: None   Collection Time: 01/04/20  2:00 PM   Specimen: Nasopharyngeal Swab; Nasopharyngeal(NP) swabs in vial transport medium  Result Value Ref Range Status   SARS Coronavirus 2 by RT PCR NEGATIVE NEGATIVE Final    Comment: (NOTE) SARS-CoV-2 target nucleic acids are NOT DETECTED.  The SARS-CoV-2 RNA is generally detectable in upper respiratory specimens during the acute phase of infection. The lowest concentration of SARS-CoV-2 viral copies this assay can detect is 138 copies/mL. A negative result does not preclude SARS-Cov-2 infection and should not be used as the sole basis for treatment or other patient management decisions. A negative result may occur with  improper specimen collection/handling, submission of specimen other than nasopharyngeal swab, presence  of viral mutation(s) within the areas targeted by this assay, and inadequate number of viral copies(<138 copies/mL). A negative result must be combined with clinical observations, patient history, and epidemiological information. The expected result is Negative.  Fact Sheet for Patients:  EntrepreneurPulse.com.au  Fact Sheet for Healthcare Providers:  IncredibleEmployment.be  This test is no t yet approved or cleared by the [place] FDA and  has been authorized for detection and/or diagnosis of SARS-CoV-2 by FDA under an Emergency Use Authorization (EUA). This EUA will remain  in effect (meaning this test can be used) for the duration of the COVID-19 declaration under Section 564(b)(1) of the Act, 21 U.S.C.section 360bbb-3(b)(1), unless the authorization is terminated  or revoked sooner.       Influenza A by PCR NEGATIVE NEGATIVE Final   Influenza B by PCR NEGATIVE NEGATIVE Final    Comment: (NOTE) The Xpert Xpress SARS-CoV-2/FLU/RSV plus assay is intended as an aid in the diagnosis of influenza from Nasopharyngeal swab specimens and should not be used as a sole basis for treatment. Nasal washings and aspirates are unacceptable for Xpert Xpress SARS-CoV-2/FLU/RSV testing.  Fact Sheet for Patients: EntrepreneurPulse.com.au  Fact Sheet for Healthcare Providers: [employer]redible[employer]ployment[employer]  This test is not yet approved or cleared by the [place] FDA and has been authorized for detection and/or diagnosis of SARS-CoV-2 by FDA under an Emergency Use Authorization (EUA). This EUA will remain in effect (meaning this test can be used) for the duration of the COVID-19 declaration under Section 564(b)(1) of the Act, 21 U.S.C. section 360bbb-3(b)(1), unless the authorization is terminated or revoked.  Performed at [facility], [place] [address], [place], [place] [address]   Respiratory Panel by PCR  Status: None   Collection Time: 01/04/20  7:21 PM   Specimen: Nasopharyngeal Swab; Respiratory  Result Value Ref Range Status   Adenovirus NOT DETECTED NOT DETECTED Final   Coronavirus 229E NOT DETECTED NOT DETECTED Final    Comment: (NOTE) The Coronavirus on the Respiratory Panel, DOES NOT test for the novel  Coronavirus (2019 nCoV)    Coronavirus HKU1 NOT DETECTED NOT DETECTED Final   Coronavirus NL63 NOT DETECTED NOT DETECTED Final   Coronavirus OC43 NOT DETECTED NOT DETECTED Final   Metapneumovirus NOT DETECTED NOT DETECTED Final   Rhinovirus / Enterovirus NOT DETECTED NOT DETECTED Final   Influenza A NOT DETECTED NOT DETECTED Final   Influenza B NOT DETECTED NOT DETECTED Final   Parainfluenza Virus 1 NOT DETECTED NOT DETECTED Final   Parainfluenza Virus 2 NOT DETECTED NOT DETECTED Final   Parainfluenza Virus 3 NOT DETECTED NOT DETECTED Final   Parainfluenza Virus 4 NOT DETECTED NOT DETECTED Final   Respiratory Syncytial Virus NOT DETECTED NOT DETECTED Final   Bordetella pertussis NOT DETECTED NOT DETECTED Final   Bordetella Parapertussis NOT DETECTED NOT DETECTED Final   Chlamydophila pneumoniae NOT DETECTED NOT DETECTED Final   Mycoplasma pneumoniae NOT DETECTED NOT DETECTED Final    Comment: Performed at [facility], [place]. [address], [place], [place] [address]  Urine culture     Status: None   Collection Time: 01/04/20  9:17 PM   Specimen: Urine, Random  Result Value Ref Range Status   Specimen Description URINE, RANDOM  Final   Special Requests NONE  Final   Culture   Final    NO GROWTH Performed at [facility], [place] [address] [place], [place] [address]    Report Status 01/05/2020 FINAL  Final  Expectorated sputum assessment w rflx to resp cult     Status: None   Collection Time: 01/06/20  3:29 PM   Specimen: Expectorated Sputum  Result Value Ref Range Status   Specimen Description EXPECTORATED SPUTUM  Final   Special Requests NONE  Final   Sputum  evaluation   Final    THIS SPECIMEN IS ACCEPTABLE FOR SPUTUM CULTURE Performed at [facility], [place] [address] [place], [place] [address]    Report Status 01/06/2020 FINAL  Final  Culture, respiratory     Status: None (Preliminary result)   Collection Time: 01/06/20  3:29 PM  Result Value Ref Range Status   Specimen Description EXPECTORATED SPUTUM  Final   Special Requests NONE Reflexed from [id number]  Final   Gram Stain   Final    MODERATE WBC PRESENT,BOTH PMN AND MONONUCLEAR RARE GRAM POSITIVE COCCI FEW YEAST    Culture   Final    TOO YOUNG TO READ Performed at [facility], [place] [address], [place], [place] [address]    Report Status PENDING  Incomplete     Labs: BNP (last 3 results) No results for input(s): BNP in the last 8760 hours. Basic Metabolic Panel: Recent Labs  Lab 01/04/20 1400 01/04/20 1416 01/05/20 0626 01/06/20 0629 01/07/20 0335  NA 137 135 138 137 138  K 4.0 3.9 3.7 3.6 3.7  CL 97*  --  99 101 103  CO2 25  --  27 25 24   GLUCOSE 122*  --  106* 107* 116*  BUN 17  --  16 11 9   CREATININE 1.75*  --  1.38* 1.19* 1.12*  CALCIUM 9.1  --  8.7* 9.2 9.1  MG  --   --  2.2  --   --   PHOS  --   --  3.5  --   --    Liver Function Tests: Recent Labs  Lab 01/04/20 1400 01/05/20 0626  AST 66* 41  ALT 50* 39  ALKPHOS 101 89  BILITOT 0.7 0.6  PROT 6.6 5.5*  ALBUMIN 3.4* 2.8*   No results for input(s): LIPASE, AMYLASE in the last 168 hours. Recent Labs  Lab 01/04/20 1400 01/05/20 0626  AMMONIA 20 17   CBC: Recent Labs  Lab 01/04/20 1400 01/04/20 1416 01/05/20 0626 01/06/20 0448 01/07/20 0335  WBC 10.9*  --  10.1  --   --   NEUTROABS 9.5*  --   --   --   --   HGB 11.4* 12.2 10.3* 9.8* 10.2*  HCT 36.2 36.0 33.2* 31.7* 31.5*  MCV 86.4  --  87.4  --   --   PLT 146*  --  109*  --   --    Cardiac Enzymes: Recent Labs  Lab 01/05/20 0626  CKTOTAL 488*   BNP: Invalid input(s): POCBNP CBG: No results for input(s): GLUCAP in the last  168 hours. D-Dimer No results for input(s): DDIMER in the last 72 hours. Hgb A1c No results for input(s): HGBA1C in the last 72 hours. Lipid Profile No results for input(s): CHOL, HDL, LDLCALC, TRIG, CHOLHDL, LDLDIRECT in the last 72 hours. Thyroid function studies No results for input(s): TSH, T4TOTAL, T3FREE, THYROIDAB in the last 72 hours.  Invalid input(s): FREET3 Anemia work up No results for input(s): VITAMINB12, FOLATE, FERRITIN, TIBC, IRON, RETICCTPCT in the last 72 hours. Urinalysis    Component Value Date/Time   COLORURINE YELLOW 01/04/2020 2113   APPEARANCEUR CLEAR 01/04/2020 2113   LABSPEC 1.006 01/04/2020 2113   PHURINE 8.0 01/04/2020 2113   GLUCOSEU NEGATIVE 01/04/2020 2113   HGBUR NEGATIVE 01/04/2020 2113   [place] NEGATIVE 01/04/2020 2113   [place] NEGATIVE 01/04/2020 2113   PROTEINUR NEGATIVE 01/04/2020 2113   UROBILINOGEN 1.0 04/03/2014 1645   NITRITE NEGATIVE 01/04/2020 2113   LEUKOCYTESUR NEGATIVE 01/04/2020 2113   Sepsis Labs Invalid input(s): PROCALCITONIN,  WBC,  LACTICIDVEN Microbiology Recent Results (from the past 240 hour(s))  Resp Panel by RT-PCR (Flu A&B, Covid) Nasopharyngeal Swab     Status: None   Collection Time: 12/31/19  1:32 AM   Specimen: Nasopharyngeal Swab; Nasopharyngeal(NP) swabs in vial transport medium  Result Value Ref Range Status   SARS Coronavirus 2 by RT PCR NEGATIVE NEGATIVE Final    Comment: (NOTE) SARS-CoV-2 target nucleic acids are NOT DETECTED.  The SARS-CoV-2 RNA is generally detectable in upper respiratory specimens during the acute phase of infection. The lowest concentration of SARS-CoV-2 viral copies this assay can detect is 138 copies/mL. A negative result does not preclude SARS-Cov-2 infection and should not be used as the sole basis for treatment or other patient management decisions. A negative result may occur with  improper specimen collection/handling, submission of specimen other than  nasopharyngeal swab, presence of viral mutation(s) within the areas targeted by this assay, and inadequate number of viral copies(<138 copies/mL). A negative result must be combined with clinical observations, patient history, and epidemiological information. The expected result is Negative.  Fact Sheet for Patients:  EntrepreneurPulse.com.au  Fact Sheet for Healthcare Providers:  IncredibleEmployment[employer]be  This test is no t yet approved or cleared by the [place] FDA and  has been authorized for detection and/or diagnosis of SARS-CoV-2 by FDA under an Emergency Use Authorization (EUA). This  EUA will remain  in effect (meaning this test can be used) for the duration of the COVID-19 declaration under Section 564(b)(1) of the Act, 21 U.S.C.section 360bbb-3(b)(1), unless the authorization is terminated  or revoked sooner.       Influenza A by PCR NEGATIVE NEGATIVE Final   Influenza B by PCR NEGATIVE NEGATIVE Final    Comment: (NOTE) The Xpert Xpress SARS-CoV-2/FLU/RSV plus assay is intended as an aid in the diagnosis of influenza from Nasopharyngeal swab specimens and should not be used as a sole basis for treatment. Nasal washings and aspirates are unacceptable for Xpert Xpress SARS-CoV-2/FLU/RSV testing.  Fact Sheet for Patients: EntrepreneurPulse.com.au  Fact Sheet for Healthcare Providers: IncredibleEmployment.be  This test is not yet approved or cleared by the [place] FDA and has been authorized for detection and/or diagnosis of SARS-CoV-2 by FDA under an Emergency Use Authorization (EUA). This EUA will remain in effect (meaning this test can be used) for the duration of the COVID-19 declaration under Section 564(b)(1) of the Act, 21 U.S.C. section 360bbb-3(b)(1), unless the authorization is terminated or revoked.  Performed at [facility], [place] [address], [place], [place] [address]    Blood Culture (routine x 2)     Status: None (Preliminary result)   Collection Time: 01/04/20  1:47 PM   Specimen: BLOOD  Result Value Ref Range Status   Specimen Description BLOOD BLOOD LEFT HAND  Final   Special Requests   Final    BOTTLES DRAWN AEROBIC ONLY Blood Culture results may not be optimal due to an inadequate volume of blood received in culture bottles   Culture   Final    NO GROWTH 3 DAYS Performed at [facility], [place] [address] [place], [place] [address]    Report Status PENDING  Incomplete  Blood Culture (routine x 2)     Status: None (Preliminary result)   Collection Time: 01/04/20  2:00 PM   Specimen: BLOOD  Result Value Ref Range Status   Specimen Description BLOOD BLOOD RIGHT HAND  Final   Special Requests   Final    BOTTLES DRAWN AEROBIC AND ANAEROBIC Blood Culture adequate volume   Culture   Final    NO GROWTH 3 DAYS Performed at [facility], [place] [address] [place], [place] [address]    Report Status PENDING  Incomplete  Resp Panel by RT-PCR (Flu A&B, Covid) Nasopharyngeal Swab     Status: None   Collection Time: 01/04/20  2:00 PM   Specimen: Nasopharyngeal Swab; Nasopharyngeal(NP) swabs in vial transport medium  Result Value Ref Range Status   SARS Coronavirus 2 by RT PCR NEGATIVE NEGATIVE Final    Comment: (NOTE) SARS-CoV-2 target nucleic acids are NOT DETECTED.  The SARS-CoV-2 RNA is generally detectable in upper respiratory specimens during the acute phase of infection. The lowest concentration of SARS-CoV-2 viral copies this assay can detect is 138 copies/mL. A negative result does not preclude SARS-Cov-2 infection and should not be used as the sole basis for treatment or other patient management decisions. A negative result may occur with  improper specimen collection/handling, submission of specimen other than nasopharyngeal swab, presence of viral mutation(s) within the areas targeted by this assay, and inadequate number of  viral copies(<138 copies/mL). A negative result must be combined with clinical observations, patient history, and epidemiological information. The expected result is Negative.  Fact Sheet for Patients:  EntrepreneurPulse.com.au  Fact Sheet for Healthcare Providers:  IncredibleEmployment.be  This test is no  t yet approved or cleared by the [place] and  has been authorized for detection and/or diagnosis of SARS-CoV-2 by FDA under an Emergency Use Authorization (EUA). This EUA will remain  in effect (meaning this test can be used) for the duration of the COVID-19 declaration under Section 564(b)(1) of the Act, 21 U.S.C.section 360bbb-3(b)(1), unless the authorization is terminated  or revoked sooner.       Influenza A by PCR NEGATIVE NEGATIVE Final   Influenza B by PCR NEGATIVE NEGATIVE Final    Comment: (NOTE) The Xpert Xpress SARS-CoV-2/FLU/RSV plus assay is intended as an aid in the diagnosis of influenza from Nasopharyngeal swab specimens and should not be used as a sole basis for treatment. Nasal washings and aspirates are unacceptable for Xpert Xpress SARS-CoV-2/FLU/RSV testing.  Fact Sheet for Patients: EntrepreneurPulse.com.au  Fact Sheet for Healthcare Providers: IncredibleEmployment.be  This test is not yet approved or cleared by the [place] FDA and has been authorized for detection and/or diagnosis of SARS-CoV-2 by FDA under an Emergency Use Authorization (EUA). This EUA will remain in effect (meaning this test can be used) for the duration of the COVID-19 declaration under Section 564(b)(1) of the Act, 21 U.S.C. section 360bbb-3(b)(1), unless the authorization is terminated or revoked.  Performed at [facility], [place] [address] [place], [place] [address]   Respiratory Panel by PCR     Status: None   Collection Time: 01/04/20  7:21 PM   Specimen: Nasopharyngeal Swab;  Respiratory  Result Value Ref Range Status   Adenovirus NOT DETECTED NOT DETECTED Final   Coronavirus 229E NOT DETECTED NOT DETECTED Final    Comment: (NOTE) The Coronavirus on the Respiratory Panel, DOES NOT test for the novel  Coronavirus (2019 nCoV)    Coronavirus HKU1 NOT DETECTED NOT DETECTED Final   Coronavirus NL63 NOT DETECTED NOT DETECTED Final   Coronavirus OC43 NOT DETECTED NOT DETECTED Final   Metapneumovirus NOT DETECTED NOT DETECTED Final   Rhinovirus / Enterovirus NOT DETECTED NOT DETECTED Final   Influenza A NOT DETECTED NOT DETECTED Final   Influenza B NOT DETECTED NOT DETECTED Final   Parainfluenza Virus 1 NOT DETECTED NOT DETECTED Final   Parainfluenza Virus 2 NOT DETECTED NOT DETECTED Final   Parainfluenza Virus 3 NOT DETECTED NOT DETECTED Final   Parainfluenza Virus 4 NOT DETECTED NOT DETECTED Final   Respiratory Syncytial Virus NOT DETECTED NOT DETECTED Final   Bordetella pertussis NOT DETECTED NOT DETECTED Final   Bordetella Parapertussis NOT DETECTED NOT DETECTED Final   Chlamydophila pneumoniae NOT DETECTED NOT DETECTED Final   Mycoplasma pneumoniae NOT DETECTED NOT DETECTED Final    Comment: Performed at [facility], [place]. [address], [place], [place] [address]  Urine culture     Status: None   Collection Time: 01/04/20  9:17 PM   Specimen: Urine, Random  Result Value Ref Range Status   Specimen Description URINE, RANDOM  Final   Special Requests NONE  Final   Culture   Final    NO GROWTH Performed at [facility], [place] [address] [place],  [address]    Report Status 01/05/2020 FINAL  Final  Expectorated sputum assessment w rflx to resp cult     Status: None   Collection Time: 01/06/20  3:29 PM   Specimen: Expectorated Sputum  Result Value Ref Range Status   Specimen Description EXPECTORATED SPUTUM  Final   Special Requests NONE  Final   Sputum evaluation  Final    THIS SPECIMEN IS ACCEPTABLE FOR SPUTUM CULTURE Performed at  [facility], [place] [address], [place], [place] [address]    Report Status 01/06/2020 FINAL  Final  Culture, respiratory     Status: None (Preliminary result)   Collection Time: 01/06/20  3:29 PM  Result Value Ref Range Status   Specimen Description EXPECTORATED SPUTUM  Final   Special Requests NONE Reflexed from [id number]  Final   Gram Stain   Final    MODERATE WBC PRESENT,BOTH PMN AND MONONUCLEAR RARE GRAM POSITIVE COCCI FEW YEAST    Culture   Final    TOO YOUNG TO READ Performed at [facility], [place] [address], [place], [place] [address]    Report Status PENDING  Incomplete     Time coordinating discharge: Over 30 minutes  SIGNED:   Charlynne Cousins, MD  [facility] 01/08/2020, 7:49 AM Pager   If 7PM-7AM, please contact night-coverage [email or web address] Password [email or web address]

## 2020-01-08 NOTE — TOC Transition Note (Addendum)
Transition of Care [facility]) - CM/SW Discharge Note   Patient Details  Name: [name] MRN: [id number] Date of Birth: [birth year]  Transition of Care [facility]) CM/SW Contact:  Zenon Mayo, RN Phone Number: 01/08/2020, 8:59 AM   Clinical Narrative:    NCM spoke with patient, asked if she wanted a rolling walker, she states she uses a cane at home so she does not want a rolling walker, also asked if she wanted the shower chair, she stated she does not want a shower chair.  NCM informed her that she could get a reacher from like the [employer].  She states she does not really need one.  She has transportation at [employer], she states she does not really need one.  She has transportation at [employer], she states her daughter works  And will pick her up later this evening when she gets off. NCM asked about transportation to her MD apts she states she uses Medicaid transport services.  Final next level of care: Home/Self Care Barriers to Discharge: No Barriers Identified   Patient Goals and CMS Choice Patient states their goals for this hospitalization and ongoing recovery are:: to do better with her diet   Choice offered to / list presented to : NA  Discharge Placement                       Discharge Plan and Services   Discharge Planning Services: CM Consult Post Acute Care Choice: NA            DME Agency: NA       HH Arranged: NA          Social Determinants of Health (SDOH) Interventions     Readmission Risk Interventions Readmission Risk Prevention Plan 01/08/2020  Transportation Screening Complete  PCP or Specialist Appt within 3-5 Days Complete  HRI or [place] Complete  Social Work Consult for [place] Planning/Counseling Complete  Palliative Care Screening Not Applicable  Medication Review Press photographer) Complete  Some recent data might be hidden

## 2020-01-08 NOTE — Plan of Care (Signed)
°  Problem: Education: °Goal: Ability to demonstrate management of disease process will improve °Outcome: Adequate for Discharge °Goal: Ability to verbalize understanding of medication therapies will improve °Outcome: Adequate for Discharge °Goal: Individualized Educational Video(s) °Outcome: Adequate for Discharge °  °Problem: Activity: °Goal: Capacity to carry out activities will improve °Outcome: Adequate for Discharge °  °Problem: Cardiac: °Goal: Ability to achieve and maintain adequate cardiopulmonary perfusion will improve °Outcome: Adequate for Discharge °  °Problem: Education: °Goal: Knowledge of disease or condition will improve °Outcome: Adequate for Discharge °Goal: Knowledge of the prescribed therapeutic regimen will improve °Outcome: Adequate for Discharge °Goal: Individualized Educational Video(s) °Outcome: Adequate for Discharge °  °Problem: Activity: °Goal: Ability to tolerate increased activity will improve °Outcome: Adequate for Discharge °Goal: Will verbalize the importance of balancing activity with adequate rest periods °Outcome: Adequate for Discharge °  °Problem: Respiratory: °Goal: Ability to maintain a clear airway will improve °Outcome: Adequate for Discharge °Goal: Levels of oxygenation will improve °Outcome: Adequate for Discharge °Goal: Ability to maintain adequate ventilation will improve °Outcome: Adequate for Discharge °  °

## 2020-01-09 LAB — CULTURE, RESPIRATORY W GRAM STAIN: Culture: NORMAL

## 2020-01-09 LAB — CULTURE, BLOOD (ROUTINE X 2)
Culture: NO GROWTH
Culture: NO GROWTH
Special Requests: ADEQUATE

## 2020-01-26 ENCOUNTER — Ambulatory Visit: Payer: Medicare Other | Admitting: Physician Assistant

## 2020-01-26 ENCOUNTER — Telehealth: Payer: Self-pay

## 2020-01-26 NOTE — Telephone Encounter (Signed)
Called pt to r/s gel injection appt due to not verifying benefits for the 1st of the year  and she stated she is in the hospital anyways and that she will call back when she is ready to have them.

## 2020-02-02 ENCOUNTER — Telehealth: Payer: Self-pay

## 2020-02-02 NOTE — Telephone Encounter (Signed)
Submitted for VOB for Synvisc one-Bilateral knee  

## 2020-02-16 ENCOUNTER — Telehealth: Payer: Self-pay

## 2020-02-16 NOTE — Telephone Encounter (Signed)
Approved for Synvisc One-Bilateral knee Buy and Bill Dr. Ninfa [place] No copay 20% OOP No prior auth required

## 2020-02-18 ENCOUNTER — Ambulatory Visit: Payer: Medicare Other | Admitting: Physician Assistant

## 2020-03-03 ENCOUNTER — Emergency Department (HOSPITAL_COMMUNITY)
Admission: EM | Admit: 2020-03-03 | Discharge: 2020-03-05 | Disposition: A | Discharge status: Home / Self Care | Payer: Medicare Other | Attending: Emergency Medicine | Admitting: Emergency Medicine

## 2020-03-03 ENCOUNTER — Other Ambulatory Visit: Payer: Self-pay

## 2020-03-03 ENCOUNTER — Encounter (HOSPITAL_COMMUNITY): Payer: Self-pay | Admitting: Emergency Medicine

## 2020-03-03 DIAGNOSIS — Z7901 Long term (current) use of anticoagulants: Secondary | ICD-10-CM | POA: Insufficient documentation

## 2020-03-03 DIAGNOSIS — Z86711 Personal history of pulmonary embolism: Secondary | ICD-10-CM | POA: Insufficient documentation

## 2020-03-03 DIAGNOSIS — F17210 Nicotine dependence, cigarettes, uncomplicated: Secondary | ICD-10-CM | POA: Insufficient documentation

## 2020-03-03 DIAGNOSIS — K219 Gastro-esophageal reflux disease without esophagitis: Secondary | ICD-10-CM | POA: Insufficient documentation

## 2020-03-03 DIAGNOSIS — I129 Hypertensive chronic kidney disease with stage 1 through stage 4 chronic kidney disease, or unspecified chronic kidney disease: Secondary | ICD-10-CM | POA: Insufficient documentation

## 2020-03-03 DIAGNOSIS — M545 Low back pain, unspecified: Secondary | ICD-10-CM | POA: Insufficient documentation

## 2020-03-03 DIAGNOSIS — R197 Diarrhea, unspecified: Secondary | ICD-10-CM | POA: Insufficient documentation

## 2020-03-03 DIAGNOSIS — B181 Chronic viral hepatitis B without delta-agent: Secondary | ICD-10-CM | POA: Insufficient documentation

## 2020-03-03 DIAGNOSIS — N183 Chronic kidney disease, stage 3 unspecified: Secondary | ICD-10-CM | POA: Insufficient documentation

## 2020-03-03 DIAGNOSIS — J449 Chronic obstructive pulmonary disease, unspecified: Secondary | ICD-10-CM | POA: Diagnosis not present

## 2020-03-03 DIAGNOSIS — R1032 Left lower quadrant pain: Secondary | ICD-10-CM | POA: Insufficient documentation

## 2020-03-03 DIAGNOSIS — Z79899 Other long term (current) drug therapy: Secondary | ICD-10-CM | POA: Diagnosis not present

## 2020-03-03 DIAGNOSIS — G8929 Other chronic pain: Secondary | ICD-10-CM | POA: Diagnosis not present

## 2020-03-03 DIAGNOSIS — K7469 Other cirrhosis of liver: Secondary | ICD-10-CM | POA: Insufficient documentation

## 2020-03-03 LAB — COMPREHENSIVE METABOLIC PANEL
ALT: 26 U/L (ref 0–44)
AST: 31 U/L (ref 15–41)
Albumin: 3.5 g/dL (ref 3.5–5.0)
Alkaline Phosphatase: 101 U/L (ref 38–126)
Anion gap: 10 (ref 5–15)
BUN: 5 mg/dL — ABNORMAL LOW (ref 8–23)
CO2: 26 mmol/L (ref 22–32)
Calcium: 9.4 mg/dL (ref 8.9–10.3)
Chloride: 95 mmol/L — ABNORMAL LOW (ref 98–111)
Creatinine, Ser: 1.03 mg/dL — ABNORMAL HIGH (ref 0.44–1.00)
GFR, Estimated: >60 mL/min (ref >60)
Glucose, Bld: 100 mg/dL — ABNORMAL HIGH (ref 70–99)
Potassium: 3.7 mmol/L (ref 3.5–5.1)
Sodium: 131 mmol/L — ABNORMAL LOW (ref 135–145)
Total Bilirubin: 0.6 mg/dL (ref 0.3–1.2)
Total Protein: 6.8 g/dL (ref 6.5–8.1)

## 2020-03-03 LAB — URINALYSIS, ROUTINE W REFLEX MICROSCOPIC
Bilirubin Urine: NEGATIVE
Glucose, UA: NEGATIVE mg/dL
Hgb urine dipstick: NEGATIVE
Ketones, ur: NEGATIVE mg/dL
Leukocytes,Ua: NEGATIVE
Nitrite: NEGATIVE
Protein, ur: NEGATIVE mg/dL
Specific Gravity, Urine: <1.005 — ABNORMAL LOW (ref 1.005–1.030)
pH: 7 (ref 5.0–8.0)

## 2020-03-03 LAB — CBC
HCT: 32.5 % — ABNORMAL LOW (ref 36.0–46.0)
Hemoglobin: 9.9 g/dL — ABNORMAL LOW (ref 12.0–15.0)
MCH: 25.3 pg — ABNORMAL LOW (ref 26.0–34.0)
MCHC: 30.5 g/dL (ref 30.0–36.0)
MCV: 83.1 fL (ref 80.0–100.0)
Platelets: 186 10*3/uL (ref 150–400)
RBC: 3.91 MIL/uL (ref 3.87–5.11)
RDW: 14.2 % (ref 11.5–15.5)
WBC: 6.5 10*3/uL (ref 4.0–10.5)
nRBC: 0 % (ref 0.0–0.2)

## 2020-03-03 LAB — LIPASE, BLOOD: Lipase: 43 U/L (ref 11–51)

## 2020-03-03 NOTE — ED Triage Notes (Signed)
Patient reports generalized abdominal pain with diarrhea onset 3 days ago , no emesis or fever , patient added bilateral lower legs edema with swelling and pain .

## 2020-03-04 ENCOUNTER — Emergency Department (HOSPITAL_COMMUNITY): Payer: Medicare Other

## 2020-03-04 ENCOUNTER — Ambulatory Visit: Payer: Medicare Other | Admitting: Physician Assistant

## 2020-03-04 DIAGNOSIS — R1032 Left lower quadrant pain: Secondary | ICD-10-CM | POA: Diagnosis not present

## 2020-03-04 MED ORDER — MORPHINE SULFATE (PF) 4 MG/ML IV SOLN
4.0000 mg | Freq: Once | INTRAVENOUS | Status: AC
  Administered 2020-03-04: 4 mg via INTRAVENOUS
  Filled 2020-03-04: qty 1

## 2020-03-04 MED ORDER — ONDANSETRON HCL 4 MG/2ML IJ SOLN
4.0000 mg | Freq: Once | INTRAMUSCULAR | Status: AC
  Administered 2020-03-04: 4 mg via INTRAVENOUS
  Filled 2020-03-04: qty 2

## 2020-03-04 MED ORDER — IOHEXOL 300 MG/ML  SOLN
100.0000 mL | Freq: Once | INTRAMUSCULAR | Status: AC | PRN
  Administered 2020-03-04: 100 mL via INTRAVENOUS

## 2020-03-04 NOTE — ED Notes (Signed)
Patient transported to X-ray 

## 2020-03-04 NOTE — Discharge Instructions (Signed)
Your work-up today was normal-- CT did not show any acute findings in the abdomen or back. You will need to follow-up with your primary care doctor and your specialists about the concerns. For diarrhea, can try using imodium.  Make sure to stay hydrated. Return here for new concerns.

## 2020-03-04 NOTE — ED Provider Notes (Signed)
[facility] EMERGENCY DEPARTMENT Provider Note   CSN: [id number] Arrival date & time: 03/03/20  1951     History Chief Complaint  Patient presents with  . Abdominal Pain    [name] is a 65 y.o. female.  The history is provided by the patient and medical records.  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  1.  Abdominal pain-- states diarrhea for 3 days, pain seems more so localized to left lower abdomen.  No nausea, vomiting, fever, chills.  No recent travel, sick contacts, abnormal food intake, no recent antibiotics.  2.  Back pain-- ongoing issues for "years".  States she had re-do lumbar laminectomy on 01/26/20 with her neurosurgeon at [facility].  States she continues to have ongoing pain, but also reports "I am just eat up with bulging discs".  She denies any new numbness/weakness of the legs.  No bowel or bladder incontinence.  She continues to have some paresthesias of her feet which have been present for several months now.  She had follow-up in the neurosurgery clinic just a few days ago and everything seemed to be normal.  She is scheduled to start PT soon.  She does take Roxicodone 10 mg, but states "I have been on this so long, I do not know if it works anymore".  She is requesting dose of meds in IV now.  3.  Peripheral edema--states her legs feel like they are going to "pop".  She takes lasix PRN as she has had issues with this for several years now.  She denies SOB or chest pain.  No difficulty walking.  States symptoms do not seem to improve with elevating her legs.  Past Medical History:  Diagnosis Date  . Agoraphobia with panic attacks   . Altered mental state 10/26/2013  . Ankle fracture, left 11/27/2010   S/p ORIF 11/2   . Anxiety   .  Arthritis    "knees; lower back" (10/30/2013)  . Bipolar disorder ([place])   . Cavitary pneumonia   . Cirrhosis ([place])   . CKD (chronic kidney disease), stage III ([place])   . COPD (chronic obstructive pulmonary disease) ([place])   . Depression   . DVT (deep venous thrombosis) ([place]) 09/2013   RLE  . GERD (gastroesophageal reflux disease)   . Heart murmur   . Hepatitis B   . History of blood transfusion    "due to excessive blood loss before hysterectomy"  . History of urinary tract infection   . Hypertension    currently on no medication   . Kidney stones   . Pneumonia    "4 times in the past year" (10/30/2013)  . Pulmonary emboli ([place]) 09/2013  . Shortness of breath dyspnea    increased exertion;exercise  . Urinary frequency   . Urinary incontinence     Patient Active Problem List   Diagnosis Date Noted  . Severe sepsis with acute organ dysfunction ([place]) 01/04/2020  . Primary osteoarthritis of right knee 12/04/2019  . Primary osteoarthritis of left knee 12/04/2019  . Vitamin D deficiency 09/20/2019  . Cellulitis of foot 09/18/2019  . Aspiration pneumonia of both lower lobes due to gastric secretions ([place]) 06/02/2019  . Lumbar disc herniation 05/04/2019  . Encounter for colorectal cancer screening 04/19/2019  . Acute respiratory distress 04/06/2019  .  Hyperglycemia 04/06/2019  . Pleural effusion on right 04/06/2019  . Benzodiazepine dependence ([place]) 02/16/2019  . Cervical stenosis of spinal canal 02/15/2019  . Streptococcal sepsis ([place]) 01/22/2019  . Acute bronchitis due to Rhinovirus 11/06/2018  . Lung collapse 11/01/2018  . 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 Compression fracture of body of thoracic vertebra ([place]) 08/01/2018  . Closed nondisplaced fracture of greater tuberosity of right humerus 07/08/2018  . On rivaroxaban therapy 07/08/2018  . Chronic anticoagulation 05/27/2018  . Falls frequently 05/27/2018  . History of DVT  (deep vein thrombosis) 03/16/2018  . History of pulmonary embolism 03/10/2018  . Left lower quadrant abdominal pain 03/10/2018  . Chronic pain syndrome 03/05/2018  . COPD exacerbation ([place]) 06/09/2016  . Abnormal urine odor 09/26/2015  . Recurrent pulmonary embolism ([place]) 06/26/2015  . Lupus anticoagulant positive 06/26/2015  . Thrombocytopenia ([place]) 05/31/2015  . UTI (lower urinary tract infection) 05/26/2015  . Right flank pain   . Patchy loss of hair 03/08/2015  . Primary osteoarthritis of both knees 02/25/2015  . Incarcerated incisional hernia s/p lap repair w mesh 12/10/2014 12/10/2014  . DVT (deep venous thrombosis) ([place]) 10/31/2013  . Pulmonary embolism ([place]) 10/26/2013  . Kidney stone 10/04/2013  . Tobacco abuse 10/04/2013  . COPD (chronic obstructive pulmonary disease) ([place])   . Unspecified constipation 12/23/2012  . CKD (chronic kidney disease), stage III ([place]) 12/21/2012  . Allergic rhinitis 06/29/2012  . Chronic low back pain 06/29/2012  . Dysuria 06/29/2012  . Edema 06/29/2012  . Gastro-esophageal reflux disease without esophagitis 06/29/2012  . Hemorrhoids 06/29/2012  . Hirsutism 06/29/2012  . Insomnia 06/29/2012  . Localized superficial swelling, mass, or lump 06/29/2012  . Urinary incontinence 06/29/2012  . Skin sensation disturbance 06/29/2012  . Shortness of breath 06/29/2012  . Pulmonary embolism and infarction ([place]) 06/29/2012  . Proteinuria 06/29/2012  . Nondependent cannabis abuse 06/29/2012  . Acquired cyst of kidney 10/24/2011  . 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 Cocaine abuse ([place]) 11/27/2010  . Depressive disorder 09/23/2009  . Ascites 09/23/2009  . Obesity 09/01/2008  . Hepatitis B virus infection 08/31/2008  . Acute hepatitis C virus infection 08/31/2008  . Iron deficiency anemia 08/31/2008  . Anxiety state 08/31/2008  . Essential hypertension 08/31/2008  . Coronary atherosclerosis 08/31/2008  . Hepatic cirrhosis ([place]) 08/31/2008     Past Surgical History:  Procedure Laterality Date  . ABDOMINAL HYSTERECTOMY    . ANKLE FRACTURE SURGERY Left   . CARPAL TUNNEL RELEASE Left   . DILATION AND CURETTAGE OF UTERUS  X 2  . FRACTURE SURGERY    . HAND SURGERY     1980's/left hand   . INSERTION OF MESH N/A 12/10/2014   Procedure: INSERTION OF MESH;  Surgeon: Michael Boston, MD;  Location: [facility];  Service: General;  Laterality: N/A;  . LAPAROSCOPIC ASSISTED VENTRAL HERNIA REPAIR N/A 12/10/2014   Procedure: LAPAROSCOPIC ASSISTED REPAIR OF INCARCERATED UMBILICAL HERNIA ;  Surgeon: Michael Boston, MD;  Location: [facility];  Service: General;  Laterality: N/A;  . TUBAL LIGATION       OB History   No obstetric history on file.     Family History  Problem Relation Age of Onset  . Stroke Mother   . Lung cancer Father     Social History   Tobacco Use  . Smoking status: Current Every Day Smoker    Packs/day: 0.50    Years: 35.00    Pack years: 17.50  Types: Cigarettes    Last attempt to quit: 10/26/2013    Years since quitting: 6.3  . Smokeless tobacco: Never Used  Substance Use Topics  . Alcohol use: No    Alcohol/week: 0.0 standard drinks    Comment: "stopped drinking  in 2004"  . Drug use: No    Types: "Crack" cocaine, Marijuana    Comment: 10/30/2013 "last crack in 2014; last marijuana in ~ 1990" drug free for last 3 years     Home Medications Prior to Admission medications   Medication Sig Start Date End Date Taking? Authorizing Provider  albuterol (PROAIR HFA) 108 (90 Base) MCG/ACT inhaler INHALE 2 PUFFS INTO THE LUNGS EVERY 4 HOURS AS NEEDED FOR WHEEZING Patient taking differently: Inhale 2 puffs into the lungs every 4 (four) hours as needed for shortness of breath or wheezing. 12/04/18   Rigoberto Noel, MD  albuterol (PROVENTIL) (2.5 MG/3ML) 0.083% nebulizer solution Take 3 mLs (2.5 mg total) by nebulization every 4 (four) hours as needed for wheezing or shortness of breath. 12/04/18   Rigoberto Noel, MD   ALPRAZolam Duanne Moron) 1 MG tablet Take 0.5 tablets (0.5 mg total) by mouth 2 (two) times daily as needed for anxiety. Patient taking differently: Take 1 mg by mouth 3 (three) times daily. 06/02/15   Elgergawy, Silver Huguenin, MD  amLODipine (NORVASC) 5 MG tablet Take 5 mg by mouth daily. 12/21/19   [provider]  ascorbic acid (VITAMIN C) 500 MG tablet Take 500 mg by mouth daily.    [provider]  baclofen (LIORESAL) 20 MG tablet Take 20 mg by mouth at bedtime.    [provider]  bisacodyl (DULCOLAX) 5 MG EC tablet Take 10 mg by mouth daily as needed for mild constipation.    [provider]  budesonide-formoterol (SYMBICORT) 160-4.5 MCG/ACT inhaler Inhale 2 puffs into the lungs 2 (two) times daily. 12/04/18 12/04/19  Rigoberto Noel, MD  Cholecalciferol (VITAMIN D3) 50 MCG (2000 UT) TABS Take 2,000 Units by mouth daily.    [provider]  dicyclomine (BENTYL) 20 MG tablet Take by mouth. 08/07/19   [provider]  docusate sodium (COLACE) 100 MG capsule Take 100 mg by mouth daily as needed for mild constipation.    [provider]  doxepin (SINEQUAN) 25 MG capsule Take 25 mg by mouth at bedtime as needed. 01/30/19   [provider]  entecavir (BARACLUDE) 0.5 MG tablet Take 0.5 mg by mouth every other day. 11/25/19   [provider]  ergocalciferol (VITAMIN D2) 1.25 MG (50000 UT) capsule Take by mouth. 09/27/19   [provider]  Eszopiclone (ESZOPICLONE) 3 MG TABS Take 3 mg by mouth at bedtime. Take immediately before bedtime    [provider]  famotidine (PEPCID) 20 MG tablet Take 20 mg by mouth 2 (two) times daily. 10/28/19   [provider]  FANAPT 2 MG TABS Take 2-4 mg by mouth See admin instructions. Take 2 mg by mouth in the morning for 7 days, then increase to 4 mg thereafter 12/29/19   [provider]  furosemide (LASIX) 20 MG tablet Take one tablet twice daily for 3 days then one  tablet daily after that until gone. Patient taking differently: Take 20 mg by mouth daily as needed for edema. 11/01/19   Charlann Lange, PA-C  guaiFENesin (MUCINEX) 600 MG 12 hr tablet Take 1 tablet (600 mg total) by mouth 2 (two) times daily as needed. Patient taking differently: Take 600  mg by mouth 2 (two) times daily as needed for to loosen phlegm. 12/31/19   Maudie Flakes, MD  ibuprofen (ADVIL,MOTRIN) 800 MG tablet Take 1 tablet (800 mg total) by mouth 3 (three) times daily. Patient not taking: Reported on 01/04/2020 11/12/15   de Villier, Daryl F II, PA  levocetirizine (XYZAL) 5 MG tablet Take 5 mg by mouth every evening. 12/02/19   [provider]  Magnesium 100 MG CAPS Take 100 mg by mouth daily as needed (nerve pain).    [provider]  magnesium hydroxide (MILK OF MAGNESIA) 400 MG/5ML suspension Take 30 mLs by mouth daily as needed for mild constipation.    [provider]  montelukast (SINGULAIR) 10 MG tablet Take 10 mg by mouth at bedtime.    [provider]  Multiple Vitamins-Minerals (MULTIVITAMIN WOMEN 50+) TABS Take 1 tablet by mouth daily.    [provider]  naloxone [facility]) 4 MG/0.1ML LIQD nasal spray kit Place 4 mg into the nose once as needed (for accidental overdose). 07/01/19   [provider]  ondansetron (ZOFRAN-ODT) 8 MG disintegrating tablet Take 8 mg by mouth every 8 (eight) hours as needed for nausea or vomiting (dissolve orally). 09/18/19   [provider]  Oxycodone HCl 10 MG TABS Take 10 mg by mouth See admin instructions. Take 10 mg by mouth every four to six hours as needed for pain 10/19/18   [provider]  pantoprazole (PROTONIX) 40 MG tablet Take 40 mg by mouth 2 (two) times daily before a meal. 12/23/13   [provider]  polyethylene glycol powder (GLYCOLAX/MIRALAX) 17 GM/SCOOP powder Take 17 g by mouth daily as needed for mild constipation or moderate constipation (MIX AND DRINK).  07/08/19   [provider]  Potassium 99 MG TABS Take 99 mg by mouth daily.    [provider]  pregabalin (LYRICA) 75 MG capsule Take 75 mg by mouth 3 (three) times daily. 10/09/19   [provider]  RELISTOR 150 MG TABS Take 450 mg by mouth daily. 12/15/19   [provider]  rivaroxaban (XARELTO) 20 MG TABS tablet Take 20 mg by mouth daily.    [provider]  sucralfate (CARAFATE) 1 g tablet Take 1 g by mouth 4 (four) times daily. 07/22/19   [provider]  valACYclovir (VALTREX) 500 MG tablet Take 500 mg by mouth 2 (two) times daily. 10/22/19   [provider]  enoxaparin (LOVENOX) 120 MG/0.8ML SOLN Inject 0.8 mLs (120 mg total) into the skin every 12 (twelve) hours. 11/30/10 04/16/11  Jenelle Mages, MD  loratadine (CLARITIN) 10 MG tablet Take 10 mg by mouth daily.    04/16/11  [provider]    Allergies    Amitriptyline hcl, Fentanyl, Gabapentin, Hydromorphone hcl, Cymbalta [duloxetine hcl], Dilaudid [hydromorphone], and Neomycin-bacitracin zn-polymyx  Review of Systems   Review of Systems  Cardiovascular: Positive for leg swelling.  Gastrointestinal: Positive for abdominal pain and diarrhea.  Musculoskeletal: Positive for back pain.  All other systems reviewed and are negative.   Physical Exam Updated Vital Signs BP (!) 156/112 (BP Location: Right Arm)   Pulse 71   Temp 98.3 F (36.8 C) (Oral)   Resp 20   Ht 5' 4"  (1.626 m)   Wt 115 kg   SpO2 100%   BMI 43.52 kg/m   Physical Exam Vitals and nursing note reviewed.  Constitutional:      Appearance: She is well-developed and well-nourished.  Comments: obese  HENT:     Head: Normocephalic and atraumatic.     Mouth/Throat:     Mouth: Oropharynx is clear and moist.  Eyes:     Extraocular Movements: EOM normal.     Conjunctiva/sclera: Conjunctivae normal.     Pupils: Pupils are equal, round, and reactive to light.  Cardiovascular:      Rate and Rhythm: Normal rate and regular rhythm.     Heart sounds: Normal heart sounds.  Pulmonary:     Effort: Pulmonary effort is normal.     Breath sounds: Normal breath sounds.  Abdominal:     General: Bowel sounds are normal.     Palpations: Abdomen is soft.     Tenderness: There is abdominal tenderness in the left lower quadrant. There is no rebound. Negative signs include McBurney's sign.     Comments: Mild tenderness LLQ, abdomen is obese, no peritoneal signs noted  Musculoskeletal:        General: Normal range of motion.     Cervical back: Normal range of motion.     Comments: No appreciable pitting edema of lower extremities  Skin:    General: Skin is warm and dry.  Neurological:     Mental Status: She is alert and oriented to person, place, and time.  Psychiatric:        Mood and Affect: Mood and affect normal.     Comments: Anxious appearing     ED Results / Procedures / Treatments   Labs (all labs ordered are listed, but only abnormal results are displayed) Labs Reviewed  COMPREHENSIVE METABOLIC PANEL - Abnormal; Notable for the following components:      Result Value   Sodium 131 (*)    Chloride 95 (*)    Glucose, Bld 100 (*)    BUN 5 (*)    Creatinine, Ser 1.03 (*)    All other components within normal limits  CBC - Abnormal; Notable for the following components:   Hemoglobin 9.9 (*)    HCT 32.5 (*)    MCH 25.3 (*)    All other components within normal limits  URINALYSIS, ROUTINE W REFLEX MICROSCOPIC - Abnormal; Notable for the following components:   Specific Gravity, Urine <1.005 (*)    All other components within normal limits  LIPASE, BLOOD    EKG None  Radiology DG Chest 2 View  Result Date: 03/04/2020 CLINICAL DATA:  Peripheral edema EXAM: CHEST - 2 VIEW COMPARISON:  01/04/2020 FINDINGS: Heart is upper limits normal in size. Left base atelectasis. Right lung clear. No effusions. No acute bony abnormality. IMPRESSION: Left base atelectasis.  Electronically Signed   By: Rolm Baptise M.D.   On: 03/04/2020 02:13   CT ABDOMEN PELVIS W CONTRAST  Result Date: 03/04/2020 CLINICAL DATA:  Left lower quadrant pain. Diverticulitis suspected. Abdominal pain and diarrhea. EXAM: CT ABDOMEN AND PELVIS WITH CONTRAST TECHNIQUE: Multidetector CT imaging of the abdomen and pelvis was performed using the standard protocol following bolus administration of intravenous contrast. CONTRAST:  170m OMNIPAQUE IOHEXOL 300 MG/ML  SOLN COMPARISON:  May 25, 2015 FINDINGS: Lower chest: The lung bases are clear. The heart size is normal. Hepatobiliary: The liver is cirrhotic appearing. Normal gallbladder.There is no biliary ductal dilation. Pancreas: Normal contours without ductal dilatation. No peripancreatic fluid collection. Spleen: The spleen is enlarged measuring approximately 13 cm craniocaudad. Adrenals/Urinary Tract: --Adrenal glands: Unremarkable. --Right kidney/ureter: The right kidney is lobular in contour with multiple nonobstructing kidney stones measuring up to approximately 3  mm. --Left kidney/ureter: The left kidney is lobular in contour multiple nonobstructing stones measuring up to approximately 4 mm in the upper pole. --Urinary bladder: Unremarkable. Stomach/Bowel: --Stomach/Duodenum: No hiatal hernia or other gastric abnormality. Normal duodenal course and caliber. --Small bowel: Unremarkable. --Colon: Unremarkable. --Appendix: Normal. Vascular/Lymphatic: Atherosclerotic calcification is present within the non-aneurysmal abdominal aorta, without hemodynamically significant stenosis. --No retroperitoneal lymphadenopathy. --No mesenteric lymphadenopathy. --No pelvic or inguinal lymphadenopathy. Reproductive: Status post hysterectomy. No adnexal mass. Other: No ascites or free air. The abdominal wall is normal. Musculoskeletal. The patient is status post prior laminectomy. There are multilevel degenerative changes throughout the patient's lumbar spine. IMPRESSION:  1. No acute abdominopelvic abnormality. 2. Cirrhosis with sequela of portal hypertension. 3. Bilateral nonobstructive nephrolithiasis. Aortic Atherosclerosis (ICD10-I70.0). Electronically Signed   By: Constance Holster M.D.   On: 03/04/2020 03:03    Procedures Procedures   Medications Ordered in ED Medications  morphine 4 MG/ML injection 4 mg (4 mg Intravenous Given 03/04/20 0220)  ondansetron (ZOFRAN) injection 4 mg (4 mg Intravenous Given 03/04/20 0222)  iohexol (OMNIPAQUE) 300 MG/ML solution 100 mL (100 mLs Intravenous Contrast Given 03/04/20 0254)    ED Course  I have reviewed the triage vital signs and the nursing notes.  Pertinent labs & imaging results that were available during my care of the patient were reviewed by me and considered in my medical decision making (see chart for details).    MDM Rules/Calculators/A&P  65 year old female presenting to the ED with multiple complaints, most of which seems somewhat chronic in nature.  1.  Abdominal pain-- diarrhea x 3 days, no vomiting. No travel, abnormal food intake, or abx use.  Labs grossly reassuring.  Normal WBC count.  Does have some mild LLQ tenderness on exam.   CT obtained without acute findings.  Can use imodium PRN loose stools.  Encouraged good water intake.  2.  Back pain-- longstanding issues, follows closely with neurosurgery at [facility] with recent visit in the past week.  No new red flag symptoms today.  No new focal deficits.  Continues to have issues with paresthesias of the feet but this has been noted in several of her neurosurgery notes including visit on 02/26/20.  CT without any noted fluid collections or other concerning findings. Can follow-up with neurosurgery.  3.  Peripheral edema-- no profound edema noted on exam.  Does not clinically appear fluid overloaded.  No chest pain or SOB, O2 sats remain 100% on RA.  CXR without pulmonary edema.  Can continue PRN lasix.  She will need close follow-up with her  primary care doctor and specialists regarding these concerns.  Return here for new/acute changes.  Final Clinical Impression(s) / ED Diagnoses Final diagnoses:  Left lower quadrant abdominal pain  Diarrhea, unspecified type  Chronic bilateral low back pain without sciatica    Rx / DC Orders ED Discharge Orders    None       Larene Pickett, PA-C 89/16/94 [id number]    Delora Fuel, MD 88/28/00 [phone]

## 2020-03-18 ENCOUNTER — Ambulatory Visit: Payer: Medicare Other | Admitting: Physician Assistant

## 2020-03-26 ENCOUNTER — Ambulatory Visit (INDEPENDENT_AMBULATORY_CARE_PROVIDER_SITE_OTHER): Payer: Medicare Other | Admitting: Family Medicine

## 2020-03-26 ENCOUNTER — Other Ambulatory Visit: Payer: Self-pay

## 2020-03-26 ENCOUNTER — Encounter: Payer: Self-pay | Admitting: Family Medicine

## 2020-03-26 ENCOUNTER — Ambulatory Visit: Payer: Self-pay

## 2020-03-26 DIAGNOSIS — M25512 Pain in left shoulder: Secondary | ICD-10-CM | POA: Diagnosis not present

## 2020-03-26 MED ORDER — OXYCODONE-ACETAMINOPHEN 5-325 MG PO TABS: 1.0000 | ORAL_TABLET | ORAL | 0 refills | Status: DC | PRN

## 2020-03-26 NOTE — Progress Notes (Signed)
Office Visit Note   Patient: [name]           Date of Birth: 1955/10/31           MRN: [id number] Visit Date: 03/26/2020 Requested by: Bernerd Limbo, MD [address] [place],  [place] [id number] PCP: Bernerd Limbo, MD  Subjective: Chief Complaint  Patient presents with  . Left Shoulder - Pain    Fell last night while getting out of the car, landing on the left shoulder.    HPI: She is here with left shoulder pain.  Yesterday she had chest pain and went to the ER and was evaluated for that.  When she came home she got out of her car and fell landing on her left side.  She has had excruciating left shoulder pain since then.  She has a known rotator cuff tear and is supposed to have surgery at some point.  The pain has been tolerable until she fell.  She could not sleep at all last night.  Pain is in the lateral aspect.              ROS:   All other systems were reviewed and are negative.  Objective: Vital Signs: There were no vitals taken for this visit.  Physical Exam:  General:  Alert and oriented, in no acute distress. Pulm:  Breathing unlabored. Psy:  Normal mood, congruent affect. Skin: There is a small abrasion on the anterolateral aspect of her shoulder. Left shoulder: She has limited active abduction, but this is normal for her.  It is very painful for her to move her shoulder.  She is very tender to palpation in the proximal humerus area.   Imaging: XR Shoulder Left  Result Date: 03/26/2020 X-rays show anatomic alignment with degenerative spurring on the humeral head, I do not see a definite fracture.  There is DJD at the [facility] joint.  No rib fracture seen.   Assessment & Plan: 1.  Status post fall with severe left shoulder pain, previously known rotator cuff tear.  Cannot rule out occult fracture versus worsening of rotator cuff tear. -Sling for comfort, Percocet for pain.  Follow-up next week with Dr. Ninfa Linden.     Procedures: No procedures  performed        PMFS History: Patient Active Problem List   Diagnosis Date Noted  . Severe sepsis with acute organ dysfunction ([place]) 01/04/2020  . Primary osteoarthritis of right knee 12/04/2019  . Primary osteoarthritis of left knee 12/04/2019  . Vitamin D deficiency 09/20/2019  . Cellulitis of foot 09/18/2019  . Aspiration pneumonia of both lower lobes due to gastric secretions ([place]) 06/02/2019  . Lumbar disc herniation 05/04/2019  . Encounter for colorectal cancer screening 04/19/2019  . Acute respiratory distress 04/06/2019  . Hyperglycemia 04/06/2019  . Pleural effusion on right 04/06/2019  . Benzodiazepine dependence ([place]) 02/16/2019  . Cervical stenosis of spinal canal 02/15/2019  . Streptococcal sepsis ([place]) 01/22/2019  . Acute bronchitis due to Rhinovirus 11/06/2018  . Lung collapse 11/01/2018  . 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 Compression fracture of body of thoracic vertebra ([place]) 08/01/2018  . Closed nondisplaced fracture of greater tuberosity of right humerus 07/08/2018  . On rivaroxaban therapy 07/08/2018  . Chronic anticoagulation 05/27/2018  . Falls frequently 05/27/2018  . History of DVT (deep vein thrombosis) 03/16/2018  . History of pulmonary embolism 03/10/2018  . Left lower quadrant abdominal pain 03/10/2018  .  Chronic pain syndrome 03/05/2018  . COPD exacerbation ([place]) 06/09/2016  . Abnormal urine odor 09/26/2015  . Recurrent pulmonary embolism ([place]) 06/26/2015  . Lupus anticoagulant positive 06/26/2015  . Thrombocytopenia ([place]) 05/31/2015  . UTI (lower urinary tract infection) 05/26/2015  . Right flank pain   . Patchy loss of hair 03/08/2015  . Primary osteoarthritis of both knees 02/25/2015  . Incarcerated incisional hernia s/p lap repair w mesh 12/10/2014 12/10/2014  . DVT (deep venous thrombosis) ([place]) 10/31/2013  . Pulmonary embolism ([place]) 10/26/2013  . Kidney stone 10/04/2013  . Tobacco  abuse 10/04/2013  . COPD (chronic obstructive pulmonary disease) ([place])   . Unspecified constipation 12/23/2012  . CKD (chronic kidney disease), stage III ([place]) 12/21/2012  . Allergic rhinitis 06/29/2012  . Chronic low back pain 06/29/2012  . Dysuria 06/29/2012  . Edema 06/29/2012  . Gastro-esophageal reflux disease without esophagitis 06/29/2012  . Hemorrhoids 06/29/2012  . Hirsutism 06/29/2012  . Insomnia 06/29/2012  . Localized superficial swelling, mass, or lump 06/29/2012  . Urinary incontinence 06/29/2012  . Skin sensation disturbance 06/29/2012  . Shortness of breath 06/29/2012  . Pulmonary embolism and infarction ([place]) 06/29/2012  . Proteinuria 06/29/2012  . Nondependent cannabis abuse 06/29/2012  . Acquired cyst of kidney 10/24/2011  . 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 Cocaine abuse ([place]) 11/27/2010  . Depressive disorder 09/23/2009  . Ascites 09/23/2009  . Obesity 09/01/2008  . Hepatitis B virus infection 08/31/2008  . Acute hepatitis C virus infection 08/31/2008  . Iron deficiency anemia 08/31/2008  . Anxiety state 08/31/2008  . Essential hypertension 08/31/2008  . Coronary atherosclerosis 08/31/2008  . Hepatic cirrhosis ([place]) 08/31/2008   Past Medical History:  Diagnosis Date  . Agoraphobia with panic attacks   . Altered mental state 10/26/2013  . Ankle fracture, left 11/27/2010   S/p ORIF 11/2   . Anxiety   . Arthritis    "knees; lower back" (10/30/2013)  . Bipolar disorder ([place])   . Cavitary pneumonia   . Cirrhosis ([place])   . CKD (chronic kidney disease), stage III ([place])   . COPD (chronic obstructive pulmonary disease) ([place])   . Depression   . DVT (deep venous thrombosis) ([place]) 09/2013   RLE  . GERD (gastroesophageal reflux disease)   . Heart murmur   . Hepatitis B   . History of blood transfusion    "due to excessive blood loss before hysterectomy"  . History of urinary tract infection   . Hypertension    currently on no medication   .  Kidney stones   . Pneumonia    "4 times in the past year" (10/30/2013)  . Pulmonary emboli ([place]) 09/2013  . Shortness of breath dyspnea    increased exertion;exercise  . Urinary frequency   . Urinary incontinence     Family History  Problem Relation Age of Onset  . Stroke Mother   . Lung cancer Father     Past Surgical History:  Procedure Laterality Date  . ABDOMINAL HYSTERECTOMY    . ANKLE FRACTURE SURGERY Left   . CARPAL TUNNEL RELEASE Left   . DILATION AND CURETTAGE OF UTERUS  X 2  . FRACTURE SURGERY    . HAND SURGERY     1980's/left hand   . INSERTION OF MESH N/A 12/10/2014   Procedure: INSERTION OF MESH;  Surgeon:  Boston, MD;  Location: [facility];  Service: General;  Laterality: N/A;  . LAPAROSCOPIC ASSISTED VENTRAL HERNIA REPAIR N/A 12/10/2014   Procedure: LAPAROSCOPIC ASSISTED REPAIR  OF INCARCERATED UMBILICAL HERNIA ;  Surgeon:  Boston, MD;  Location: [facility];  Service: General;  Laterality: N/A;  . TUBAL LIGATION     Social History   Occupational History  . Not on file  Tobacco Use  . Smoking status: Current Every Day Smoker    Packs/day: 0.50    Years: 35.00    Pack years: 17.50    Types: Cigarettes    Last attempt to quit: 10/26/2013    Years since quitting: 6.4  . Smokeless tobacco: Never Used  Substance and Sexual Activity  . Alcohol use: No    Alcohol/week: 0.0 standard drinks    Comment: "stopped drinking  in 2004"  . Drug use: No    Types: "Crack" cocaine, Marijuana    Comment: 10/30/2013 "last crack in 2014; last marijuana in ~ 1990" drug free for last 3 years   . Sexual activity: Never

## 2020-03-29 ENCOUNTER — Telehealth: Payer: Self-pay

## 2020-03-29 ENCOUNTER — Telehealth: Payer: Self-pay | Admitting: Family Medicine

## 2020-03-29 MED ORDER — BACLOFEN 10 MG PO TABS: 5.0000 mg | ORAL_TABLET | Freq: Three times a day (TID) | ORAL | 3 refills | Status: DC | PRN

## 2020-03-29 NOTE — Telephone Encounter (Signed)
Please advise 

## 2020-03-29 NOTE — Telephone Encounter (Signed)
Called and left this info on the patient's mobile voice mail.

## 2020-03-29 NOTE — Telephone Encounter (Signed)
It won't help to take two muscle relaxants together.  In fact, could cause side effects.

## 2020-03-29 NOTE — Telephone Encounter (Signed)
Pt called and states that she already has BACLOFEN prescribed for digestive issues and she was wondering if Dr.Hilts can prescribe her something else?

## 2020-03-29 NOTE — Telephone Encounter (Signed)
I called and advised her of this. She said she will take the baclofen, as normal.

## 2020-03-29 NOTE — Telephone Encounter (Signed)
Rx sent 

## 2020-03-29 NOTE — Telephone Encounter (Signed)
Pt called and would like some muscle relaxer's called into the [facility] on [address].

## 2020-04-05 ENCOUNTER — Ambulatory Visit (INDEPENDENT_AMBULATORY_CARE_PROVIDER_SITE_OTHER): Payer: Medicare Other | Admitting: Physician Assistant

## 2020-04-05 ENCOUNTER — Other Ambulatory Visit: Payer: Self-pay

## 2020-04-05 ENCOUNTER — Encounter: Payer: Self-pay | Admitting: Physician Assistant

## 2020-04-05 DIAGNOSIS — M25512 Pain in left shoulder: Secondary | ICD-10-CM | POA: Diagnosis not present

## 2020-04-05 DIAGNOSIS — M1711 Unilateral primary osteoarthritis, right knee: Secondary | ICD-10-CM | POA: Diagnosis not present

## 2020-04-05 DIAGNOSIS — M1712 Unilateral primary osteoarthritis, left knee: Secondary | ICD-10-CM | POA: Diagnosis not present

## 2020-04-05 DIAGNOSIS — M170 Bilateral primary osteoarthritis of knee: Secondary | ICD-10-CM

## 2020-04-05 MED ORDER — LIDOCAINE HCL 1 % IJ SOLN
5.0000 mL | INTRAMUSCULAR | Status: AC | PRN
  Administered 2020-04-05: 5 mL

## 2020-04-05 MED ORDER — HYLAN G-F 20 48 MG/6ML IX SOSY
48.0000 mg | PREFILLED_SYRINGE | INTRA_ARTICULAR | Status: AC | PRN
  Administered 2020-04-05: 48 mg via INTRA_ARTICULAR

## 2020-04-05 MED ORDER — LIDOCAINE HCL 1 % IJ SOLN
3.0000 mL | INTRAMUSCULAR | Status: AC | PRN
  Administered 2020-04-05: 3 mL

## 2020-04-05 MED ORDER — METHYLPREDNISOLONE ACETATE 40 MG/ML IJ SUSP
40.0000 mg | INTRAMUSCULAR | Status: AC | PRN
  Administered 2020-04-05: 40 mg via INTRA_ARTICULAR

## 2020-04-05 NOTE — Progress Notes (Signed)
Office Visit Note   Patient: [name]           Date of Birth: [birth year]           MRN: [id number] Visit Date: 04/05/2020              Requested by: Bernerd Limbo, MD [address] [place],  [place] [id number] PCP: Bernerd Limbo, MD   Assessment & Plan: Visit Diagnoses:  1. Acute pain of left shoulder   2. Primary osteoarthritis of left knee   3. Primary osteoarthritis of right knee     Plan: She shown wall crawls, pendulum, Codman and forward flexion exercises for her left shoulder.  We will see her back in 2 weeks see what type of response she had to the exercises and the injection left shoulder.  In regards to the knee she knows she needs to wait at least 6 months between supplemental injections in 3 months between cortisone injections.  Patient tolerated the injections well today.  Questions were encouraged and answered.  Follow-Up Instructions: Return in about 2 weeks (around 04/19/2020).   Orders:  Orders Placed This Encounter  Procedures  . Large Joint Inj  . Large Joint Inj   No orders of the defined types were placed in this encounter.     Procedures: Large Joint Inj: bilateral knee on 04/05/2020 4:46 PM Indications: pain Details: 22 G 1.5 in needle, superolateral approach  Arthrogram: No  Medications (Right): 5 mL lidocaine 1 %; 48 mg Hylan 48 MG/6ML Aspirate (Right): 45 mL yellow and blood-tinged Medications (Left): 5 mL lidocaine 1 %; 48 mg Hylan 48 MG/6ML Outcome: tolerated well, no immediate complications Procedure, treatment alternatives, risks and benefits explained, specific risks discussed. Consent was given by the patient. Immediately prior to procedure a time out was called to verify the correct patient, procedure, equipment, support staff and site/side marked as required. Patient was prepped and draped in the usual sterile fashion.   Large Joint Inj on 04/05/2020 4:48 PM Indications: pain Details: 22 G 1.5 in needle, superior  approach  Arthrogram: No  Medications: 3 mL lidocaine 1 %; 40 mg methylPREDNISolone acetate 40 MG/ML Outcome: tolerated well, no immediate complications Procedure, treatment alternatives, risks and benefits explained, specific risks discussed. Consent was given by the patient. Immediately prior to procedure a time out was called to verify the correct patient, procedure, equipment, support staff and site/side marked as required. Patient was prepped and draped in the usual sterile fashion.       Clinical Data: No additional findings.   Subjective: Chief Complaint  Patient presents with  . Left Knee - Follow-up  . Right Knee - Follow-up  . Left Shoulder - Follow-up    HPI [name] is here for Synvisc injections both knees. She diagnosed with osteoarthritis of the knee(s).   Radiographs show evidence of joint space narrowing, osteophytes, subchondral sclerosis and/or subchondral cysts. She has knee pain which interferes with functional and activities of daily living.   She  has experienced inadequate response, adverse effects and/or intolerance with conservative treatments such as acetaminophen, NSAIDS, topical creams, physical therapy or regular exercise, knee bracing and/or weight loss.  She  is not scheduled to have a total knee replacement within 6 months of starting treatment with viscosupplementation. She comes in today for the injection of both knees but she is also having pain in her left shoulder status post fall she saw Dr. Junius Roads for and radiographs were obtained during  that visit on 03/26/2020.  I personally reviewed these x-rays and showed no acute fracture and shoulder be well located.  She states that she was placed in sling and has slowly increased her activity and regards to her left shoulder.  She does feel the shoulder has improved some but still having pain in the arm.  Review of Systems See HPI otherwise negative  Objective: Vital Signs: There were no vitals taken  for this visit.  Physical Exam Constitutional:      Appearance: She is not ill-appearing or diaphoretic.  Pulmonary:     Effort: Pulmonary effort is normal.  Neurological:     Mental Status: She is alert.  Psychiatric:        Mood and Affect: Mood normal.     Ortho Exam Bilateral knees good range of motion of both knees without significant pain.  No instability either knee.  Right knee positive effusion no abnormal warmth or erythema.  Left knee no effusion abnormal warmth erythema. Bilateral shoulders 5 5 strength with external/internal rotation against resistance empty can test is negative bilaterally.  Positive impingement testing on the left.  Forward flexion passively left arm to 180 degrees.  Specialty Comments:  No specialty comments available.  Imaging: No results found.   PMFS History: Patient Active Problem List   Diagnosis Date Noted  . Severe sepsis with acute organ dysfunction ([place]) 01/04/2020  . Primary osteoarthritis of right knee 12/04/2019  . Primary osteoarthritis of left knee 12/04/2019  . Vitamin D deficiency 09/20/2019  . Cellulitis of foot 09/18/2019  . Aspiration pneumonia of both lower lobes due to gastric secretions ([place]) 06/02/2019  . Lumbar disc herniation 05/04/2019  . Encounter for colorectal cancer screening 04/19/2019  . Acute respiratory distress 04/06/2019  . Hyperglycemia 04/06/2019  . Pleural effusion on right 04/06/2019  . Benzodiazepine dependence ([place]) 02/16/2019  . Cervical stenosis of spinal canal 02/15/2019  . Streptococcal sepsis ([place]) 01/22/2019  . Acute bronchitis due to Rhinovirus 11/06/2018  . Lung collapse 11/01/2018  . 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 Compression fracture of body of thoracic vertebra ([place]) 08/01/2018  . Closed nondisplaced fracture of greater tuberosity of right humerus 07/08/2018  . On rivaroxaban therapy 07/08/2018  . Chronic anticoagulation 05/27/2018   . Falls frequently 05/27/2018  . History of DVT (deep vein thrombosis) 03/16/2018  . History of pulmonary embolism 03/10/2018  . Left lower quadrant abdominal pain 03/10/2018  . Chronic pain syndrome 03/05/2018  . COPD exacerbation ([place]) 06/09/2016  . Abnormal urine odor 09/26/2015  . Recurrent pulmonary embolism ([place]) 06/26/2015  . Lupus anticoagulant positive 06/26/2015  . Thrombocytopenia ([place]) 05/31/2015  . UTI (lower urinary tract infection) 05/26/2015  . Right flank pain   . Patchy loss of hair 03/08/2015  . Primary osteoarthritis of both knees 02/25/2015  . Incarcerated incisional hernia s/p lap repair w mesh 12/10/2014 12/10/2014  . DVT (deep venous thrombosis) ([place]) 10/31/2013  . Pulmonary embolism ([place]) 10/26/2013  . Kidney stone 10/04/2013  . Tobacco abuse 10/04/2013  . COPD (chronic obstructive pulmonary disease) ([place])   . Unspecified constipation 12/23/2012  . CKD (chronic kidney disease), stage III ([place]) 12/21/2012  . Allergic rhinitis 06/29/2012  . Chronic low back pain 06/29/2012  . Dysuria 06/29/2012  . Edema 06/29/2012  . Gastro-esophageal reflux disease without esophagitis 06/29/2012  . Hemorrhoids 06/29/2012  . Hirsutism 06/29/2012  . Insomnia 06/29/2012  . Localized superficial swelling, mass, or lump 06/29/2012  . Urinary incontinence 06/29/2012  .  Skin sensation disturbance 06/29/2012  . Shortness of breath 06/29/2012  . Pulmonary embolism and infarction ([place]) 06/29/2012  . Proteinuria 06/29/2012  . Nondependent cannabis abuse 06/29/2012  . Acquired cyst of kidney 10/24/2011  . 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 Cocaine abuse ([place]) 11/27/2010  . Depressive disorder 09/23/2009  . Ascites 09/23/2009  . Obesity 09/01/2008  . Hepatitis B virus infection 08/31/2008  . Acute hepatitis C virus infection 08/31/2008  . Iron deficiency anemia 08/31/2008  . Anxiety state 08/31/2008  . Essential hypertension 08/31/2008  . Coronary atherosclerosis  08/31/2008  . Hepatic cirrhosis ([place]) 08/31/2008   Past Medical History:  Diagnosis Date  . Agoraphobia with panic attacks   . Altered mental state 10/26/2013  . Ankle fracture, left 11/27/2010   S/p ORIF 11/2   . Anxiety   . Arthritis    "knees; lower back" (10/30/2013)  . Bipolar disorder ([place])   . Cavitary pneumonia   . Cirrhosis ([place])   . CKD (chronic kidney disease), stage III ([place])   . COPD (chronic obstructive pulmonary disease) ([place])   . Depression   . DVT (deep venous thrombosis) ([place]) 09/2013   RLE  . GERD (gastroesophageal reflux disease)   . Heart murmur   . Hepatitis B   . History of blood transfusion    "due to excessive blood loss before hysterectomy"  . History of urinary tract infection   . Hypertension    currently on no medication   . Kidney stones   . Pneumonia    "4 times in the past year" (10/30/2013)  . Pulmonary emboli ([place]) 09/2013  . Shortness of breath dyspnea    increased exertion;exercise  . Urinary frequency   . Urinary incontinence     Family History  Problem Relation Age of Onset  . Stroke Mother   . Lung cancer Father     Past Surgical History:  Procedure Laterality Date  . ABDOMINAL HYSTERECTOMY    . ANKLE FRACTURE SURGERY Left   . CARPAL TUNNEL RELEASE Left   . DILATION AND CURETTAGE OF UTERUS  X 2  . FRACTURE SURGERY    . HAND SURGERY     1980's/left hand   . INSERTION OF MESH N/A 12/10/2014   Procedure: INSERTION OF MESH;  Surgeon: Michael Boston, MD;  Location: [facility];  Service: General;  Laterality: N/A;  . LAPAROSCOPIC ASSISTED VENTRAL HERNIA REPAIR N/A 12/10/2014   Procedure: LAPAROSCOPIC ASSISTED REPAIR OF INCARCERATED UMBILICAL HERNIA ;  Surgeon: Michael Boston, MD;  Location: [facility];  Service: General;  Laterality: N/A;  . TUBAL LIGATION     Social History   Occupational History  . Not on file  Tobacco Use  . Smoking status: Current Every Day Smoker    Packs/day: 0.50    Years: 35.00    Pack years: 17.50    Types:  Cigarettes    Last attempt to quit: 10/26/2013    Years since quitting: 6.4  . Smokeless tobacco: Never Used  Substance and Sexual Activity  . Alcohol use: No    Alcohol/week: 0.0 standard drinks    Comment: "stopped drinking  in 2004"  . Drug use: No    Types: "Crack" cocaine, Marijuana    Comment: 10/30/2013 "last crack in 2014; last marijuana in ~ 1990" drug free for last 3 years   . Sexual activity: Never

## 2020-04-19 ENCOUNTER — Ambulatory Visit: Payer: Medicare Other | Admitting: Physician Assistant

## 2020-04-29 ENCOUNTER — Encounter (HOSPITAL_COMMUNITY): Payer: Self-pay

## 2020-04-29 ENCOUNTER — Emergency Department (HOSPITAL_COMMUNITY)
Admission: EM | Admit: 2020-04-29 | Discharge: 2020-04-29 | Disposition: A | Discharge status: Home / Self Care | Payer: Medicare Other | Attending: Emergency Medicine | Admitting: Emergency Medicine

## 2020-04-29 ENCOUNTER — Emergency Department (HOSPITAL_COMMUNITY): Payer: Medicare Other

## 2020-04-29 ENCOUNTER — Other Ambulatory Visit: Payer: Self-pay

## 2020-04-29 DIAGNOSIS — J441 Chronic obstructive pulmonary disease with (acute) exacerbation: Secondary | ICD-10-CM | POA: Insufficient documentation

## 2020-04-29 DIAGNOSIS — R109 Unspecified abdominal pain: Secondary | ICD-10-CM | POA: Insufficient documentation

## 2020-04-29 DIAGNOSIS — Z87891 Personal history of nicotine dependence: Secondary | ICD-10-CM | POA: Diagnosis not present

## 2020-04-29 DIAGNOSIS — B369 Superficial mycosis, unspecified: Secondary | ICD-10-CM | POA: Insufficient documentation

## 2020-04-29 DIAGNOSIS — Z79899 Other long term (current) drug therapy: Secondary | ICD-10-CM | POA: Diagnosis not present

## 2020-04-29 DIAGNOSIS — R52 Pain, unspecified: Secondary | ICD-10-CM

## 2020-04-29 DIAGNOSIS — Z7951 Long term (current) use of inhaled steroids: Secondary | ICD-10-CM | POA: Diagnosis not present

## 2020-04-29 DIAGNOSIS — R079 Chest pain, unspecified: Secondary | ICD-10-CM | POA: Insufficient documentation

## 2020-04-29 DIAGNOSIS — R1084 Generalized abdominal pain: Secondary | ICD-10-CM

## 2020-04-29 DIAGNOSIS — R21 Rash and other nonspecific skin eruption: Secondary | ICD-10-CM | POA: Diagnosis present

## 2020-04-29 DIAGNOSIS — Z7901 Long term (current) use of anticoagulants: Secondary | ICD-10-CM | POA: Insufficient documentation

## 2020-04-29 DIAGNOSIS — I129 Hypertensive chronic kidney disease with stage 1 through stage 4 chronic kidney disease, or unspecified chronic kidney disease: Secondary | ICD-10-CM | POA: Diagnosis not present

## 2020-04-29 DIAGNOSIS — N183 Chronic kidney disease, stage 3 unspecified: Secondary | ICD-10-CM | POA: Diagnosis not present

## 2020-04-29 DIAGNOSIS — R059 Cough, unspecified: Secondary | ICD-10-CM | POA: Diagnosis not present

## 2020-04-29 DIAGNOSIS — K219 Gastro-esophageal reflux disease without esophagitis: Secondary | ICD-10-CM | POA: Insufficient documentation

## 2020-04-29 LAB — COMPREHENSIVE METABOLIC PANEL
ALT: 21 U/L (ref 0–44)
AST: 25 U/L (ref 15–41)
Albumin: 4 g/dL (ref 3.5–5.0)
Alkaline Phosphatase: 98 U/L (ref 38–126)
Anion gap: 9 (ref 5–15)
BUN: 10 mg/dL (ref 8–23)
CO2: 27 mmol/L (ref 22–32)
Calcium: 9.6 mg/dL (ref 8.9–10.3)
Chloride: 104 mmol/L (ref 98–111)
Creatinine, Ser: 1.22 mg/dL — ABNORMAL HIGH (ref 0.44–1.00)
GFR, Estimated: 50 mL/min — ABNORMAL LOW (ref >60)
Glucose, Bld: 99 mg/dL (ref 70–99)
Potassium: 4 mmol/L (ref 3.5–5.1)
Sodium: 140 mmol/L (ref 135–145)
Total Bilirubin: 0.6 mg/dL (ref 0.3–1.2)
Total Protein: 7.5 g/dL (ref 6.5–8.1)

## 2020-04-29 LAB — URINALYSIS, ROUTINE W REFLEX MICROSCOPIC
Bilirubin Urine: NEGATIVE
Glucose, UA: NEGATIVE mg/dL
Hgb urine dipstick: NEGATIVE
Ketones, ur: NEGATIVE mg/dL
Nitrite: NEGATIVE
Protein, ur: 100 mg/dL — AB
Specific Gravity, Urine: 1.016 (ref 1.005–1.030)
pH: 6 (ref 5.0–8.0)

## 2020-04-29 LAB — CBC
HCT: 40.1 % (ref 36.0–46.0)
Hemoglobin: 12.2 g/dL (ref 12.0–15.0)
MCH: 25.3 pg — ABNORMAL LOW (ref 26.0–34.0)
MCHC: 30.4 g/dL (ref 30.0–36.0)
MCV: 83.2 fL (ref 80.0–100.0)
Platelets: 161 10*3/uL (ref 150–400)
RBC: 4.82 MIL/uL (ref 3.87–5.11)
RDW: 17.3 % — ABNORMAL HIGH (ref 11.5–15.5)
WBC: 4.9 10*3/uL (ref 4.0–10.5)
nRBC: 0 % (ref 0.0–0.2)

## 2020-04-29 LAB — TROPONIN I (HIGH SENSITIVITY): Troponin I (High Sensitivity): 7 ng/L (ref <18)

## 2020-04-29 LAB — LIPASE, BLOOD: Lipase: 45 U/L (ref 11–51)

## 2020-04-29 MED ORDER — ONDANSETRON HCL 4 MG/2ML IJ SOLN
4.0000 mg | Freq: Once | INTRAMUSCULAR | Status: AC
  Administered 2020-04-29: 4 mg via INTRAVENOUS
  Filled 2020-04-29: qty 2

## 2020-04-29 MED ORDER — IPRATROPIUM BROMIDE HFA 17 MCG/ACT IN AERS
2.0000 | INHALATION_SPRAY | Freq: Once | RESPIRATORY_TRACT | Status: AC
  Administered 2020-04-29: 2 via RESPIRATORY_TRACT
  Filled 2020-04-29: qty 12.9

## 2020-04-29 MED ORDER — OXYCODONE-ACETAMINOPHEN 5-325 MG PO TABS
1.0000 | ORAL_TABLET | Freq: Once | ORAL | Status: AC
Start: 2020-04-29 — End: 2020-04-29
  Administered 2020-04-29: 1 via ORAL
  Filled 2020-04-29: qty 1

## 2020-04-29 MED ORDER — MORPHINE SULFATE (PF) 4 MG/ML IV SOLN
4.0000 mg | Freq: Once | INTRAVENOUS | Status: AC
Start: 2020-04-29 — End: 2020-04-29
  Administered 2020-04-29: 4 mg via INTRAVENOUS
  Filled 2020-04-29: qty 1

## 2020-04-29 MED ORDER — ALBUTEROL SULFATE HFA 108 (90 BASE) MCG/ACT IN AERS
4.0000 | INHALATION_SPRAY | Freq: Once | RESPIRATORY_TRACT | Status: AC
  Administered 2020-04-29: 4 via RESPIRATORY_TRACT
  Filled 2020-04-29: qty 6.7

## 2020-04-29 MED ORDER — SODIUM CHLORIDE 0.9 % IV SOLN
1.0000 g | Freq: Once | INTRAVENOUS | Status: AC
  Administered 2020-04-29: 1 g via INTRAVENOUS
  Filled 2020-04-29: qty 10

## 2020-04-29 MED ORDER — CLOTRIMAZOLE-BETAMETHASONE 1-0.05 % EX CREA: TOPICAL_CREAM | CUTANEOUS | 0 refills | Status: DC

## 2020-04-29 MED ORDER — CEPHALEXIN 500 MG PO CAPS: 500.0000 mg | ORAL_CAPSULE | Freq: Four times a day (QID) | ORAL | 0 refills | Status: DC

## 2020-04-29 NOTE — ED Notes (Signed)
Provided pt with sprite and sandwich

## 2020-04-29 NOTE — ED Triage Notes (Signed)
Patient c/o chest pain/congestion x 2-3 days. Patient states a productive cough with yellow sputum. Patient states she was hospitalized last week with pneumonia. Patient also c/o lower abdominal pain and nausea x 2-3 days.   Patient also reports a rash under arms x 1 week.

## 2020-04-29 NOTE — ED Provider Notes (Signed)
[facility] DEPT Provider Note   CSN: [id number] Arrival date & time: 04/29/20  0816     History Chief Complaint  Patient presents with  . Abdominal Pain  . Rash  . chest congestion    [name] is a 65 y.o. female.  Patient c/o general pain, especially to abdomen and chest. Symptoms present for past 1-2 weeks, states hospitalized for same at outside facility but symptoms persist. Pain dull, moderate, constant, diffuse abd and and chest, non radiating, at rest, no specific exacerbating or alleviating factors. Occasional non prod cough. No sore throat or runny nose. No fever or chills. No vomiting or diarrhea. Is having normal bms, denies severe constipation. No dysuria or hematuria. No leg pain or swelling. Also notes mildly itchy rash/redness to bilateral axilla, notes hx yeast.   The history is provided by the patient.  Abdominal Pain Associated symptoms: chest pain and cough   Associated symptoms: no chills, no diarrhea, no dysuria, no fever, no hematuria, no shortness of breath, no sore throat and no vomiting   Rash Associated symptoms: abdominal pain   Associated symptoms: no diarrhea, no fever, no headaches, no shortness of breath, no sore throat and not vomiting        Past Medical History:  Diagnosis Date  . Agoraphobia with panic attacks   . Altered mental state 10/26/2013  . Ankle fracture, left 11/27/2010   S/p ORIF 11/2   . Anxiety   . Arthritis    "knees; lower back" (10/30/2013)  . Bipolar disorder ([place])   . Cavitary pneumonia   . Cirrhosis ([place])   . CKD (chronic kidney disease), stage III ([place])   . COPD (chronic obstructive pulmonary disease) ([place])   . Depression   . DVT (deep venous thrombosis) ([place]) 09/2013   RLE  . GERD (gastroesophageal reflux disease)   . Heart murmur   . Hepatitis B   . History of blood transfusion    "due to excessive blood loss before hysterectomy"  . History of urinary tract infection   .  Hypertension    currently on no medication   . Kidney stones   . Pneumonia    "4 times in the past year" (10/30/2013)  . Pulmonary emboli ([place]) 09/2013  . Shortness of breath dyspnea    increased exertion;exercise  . Urinary frequency   . Urinary incontinence     Patient Active Problem List   Diagnosis Date Noted  . Severe sepsis with acute organ dysfunction ([place]) 01/04/2020  . Primary osteoarthritis of right knee 12/04/2019  . Primary osteoarthritis of left knee 12/04/2019  . Vitamin D deficiency 09/20/2019  . Cellulitis of foot 09/18/2019  . Aspiration pneumonia of both lower lobes due to gastric secretions ([place]) 06/02/2019  . Lumbar disc herniation 05/04/2019  . Encounter for colorectal cancer screening 04/19/2019  . Acute respiratory distress 04/06/2019  . Hyperglycemia 04/06/2019  . Pleural effusion on right 04/06/2019  . Benzodiazepine dependence ([place]) 02/16/2019  . Cervical stenosis of spinal canal 02/15/2019  . Streptococcal sepsis ([place]) 01/22/2019  . Acute bronchitis due to Rhinovirus 11/06/2018  . Lung collapse 11/01/2018  . 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 Compression fracture of body of thoracic vertebra ([place]) 08/01/2018  . Closed nondisplaced fracture of greater tuberosity of right humerus 07/08/2018  . On rivaroxaban therapy 07/08/2018  . Chronic anticoagulation 05/27/2018  . Falls frequently 05/27/2018  . History of DVT (deep vein thrombosis) 03/16/2018  .  History of pulmonary embolism 03/10/2018  . Left lower quadrant abdominal pain 03/10/2018  . Chronic pain syndrome 03/05/2018  . COPD exacerbation ([place]) 06/09/2016  . Abnormal urine odor 09/26/2015  . Recurrent pulmonary embolism ([place]) 06/26/2015  . Lupus anticoagulant positive 06/26/2015  . Thrombocytopenia ([place]) 05/31/2015  . UTI (lower urinary tract infection) 05/26/2015  . Right flank pain   . Patchy loss of hair 03/08/2015  . Primary osteoarthritis  of both knees 02/25/2015  . Incarcerated incisional hernia s/p lap repair w mesh 12/10/2014 12/10/2014  . DVT (deep venous thrombosis) ([place]) 10/31/2013  . Pulmonary embolism ([place]) 10/26/2013  . Kidney stone 10/04/2013  . Tobacco abuse 10/04/2013  . COPD (chronic obstructive pulmonary disease) ([place])   . Unspecified constipation 12/23/2012  . CKD (chronic kidney disease), stage III ([place]) 12/21/2012  . Allergic rhinitis 06/29/2012  . Chronic low back pain 06/29/2012  . Dysuria 06/29/2012  . Edema 06/29/2012  . Gastro-esophageal reflux disease without esophagitis 06/29/2012  . Hemorrhoids 06/29/2012  . Hirsutism 06/29/2012  . Insomnia 06/29/2012  . Localized superficial swelling, mass, or lump 06/29/2012  . Urinary incontinence 06/29/2012  . Skin sensation disturbance 06/29/2012  . Shortness of breath 06/29/2012  . Pulmonary embolism and infarction ([place]) 06/29/2012  . Proteinuria 06/29/2012  . Nondependent cannabis abuse 06/29/2012  . Acquired cyst of kidney 10/24/2011  . 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 Cocaine abuse ([place]) 11/27/2010  . Depressive disorder 09/23/2009  . Ascites 09/23/2009  . Obesity 09/01/2008  . Hepatitis B virus infection 08/31/2008  . Acute hepatitis C virus infection 08/31/2008  . Iron deficiency anemia 08/31/2008  . Anxiety state 08/31/2008  . Essential hypertension 08/31/2008  . Coronary atherosclerosis 08/31/2008  . Hepatic cirrhosis ([place]) 08/31/2008    Past Surgical History:  Procedure Laterality Date  . ABDOMINAL HYSTERECTOMY    . ANKLE FRACTURE SURGERY Left   . CARPAL TUNNEL RELEASE Left   . DILATION AND CURETTAGE OF UTERUS  X 2  . FRACTURE SURGERY    . HAND SURGERY     1980's/left hand   . INSERTION OF MESH N/A 12/10/2014   Procedure: INSERTION OF MESH;  Surgeon: Michael Boston, MD;  Location: [facility] ORS;  Service: General;  Laterality: N/A;  . LAPAROSCOPIC ASSISTED VENTRAL HERNIA REPAIR N/A 12/10/2014   Procedure: LAPAROSCOPIC ASSISTED  REPAIR OF INCARCERATED UMBILICAL HERNIA ;  Surgeon: Michael Boston, MD;  Location: [facility] ORS;  Service: General;  Laterality: N/A;  . TUBAL LIGATION       OB History   No obstetric history on file.     Family History  Problem Relation Age of Onset  . Stroke Mother   . Lung cancer Father     Social History   Tobacco Use  . Smoking status: Former Smoker    Packs/day: 0.50    Years: 35.00    Pack years: 17.50    Types: Cigarettes    Quit date: 10/26/2013    Years since quitting: 6.5  . Smokeless tobacco: Never Used  Vaping Use  . Vaping Use: Some days  . Substances: Nicotine, Flavoring  Substance Use Topics  . Alcohol use: No    Alcohol/week: 0.0 standard drinks    Comment: "stopped drinking  in 2004"  . Drug use: No    Types: "Crack" cocaine, Marijuana    Comment: 10/30/2013 "last crack in 2014; last marijuana in ~ 1990" drug free for last 3 years     Home Medications Prior to Admission medications   Medication  Sig Start Date End Date Taking? Authorizing Provider  albuterol (PROAIR HFA) 108 (90 Base) MCG/ACT inhaler INHALE 2 PUFFS INTO THE LUNGS EVERY 4 HOURS AS NEEDED FOR WHEEZING Patient taking differently: Inhale 2 puffs into the lungs every 4 (four) hours as needed for shortness of breath or wheezing. 12/04/18   Rigoberto Noel, MD  albuterol (PROVENTIL) (2.5 MG/3ML) 0.083% nebulizer solution Take 3 mLs (2.5 mg total) by nebulization every 4 (four) hours as needed for wheezing or shortness of breath. 12/04/18   Rigoberto Noel, MD  ALPRAZolam Duanne Moron) 1 MG tablet Take 0.5 tablets (0.5 mg total) by mouth 2 (two) times daily as needed for anxiety. Patient taking differently: Take 1 mg by mouth 3 (three) times daily. 06/02/15   Elgergawy, Silver Huguenin, MD  amLODipine (NORVASC) 5 MG tablet Take 5 mg by mouth daily. 12/21/19   [provider]  amoxicillin-clavulanate (AUGMENTIN) 875-125 MG tablet TAKE 1 TABLET BY MOUTH TWO TIMES DAILY FOR 2 DAYS. THROUGH 01/11/2020 01/08/20  01/07/21  Charlynne Cousins, MD  ascorbic acid (VITAMIN C) 500 MG tablet Take 500 mg by mouth daily.    [provider]  baclofen (LIORESAL) 10 MG tablet Take 0.5-1 tablets (5-10 mg total) by mouth 3 (three) times daily as needed for muscle spasms. 03/29/20   Hilts, Legrand Como, MD  bisacodyl (DULCOLAX) 5 MG EC tablet Take 10 mg by mouth daily as needed for mild constipation.    [provider]  budesonide-formoterol (SYMBICORT) 160-4.5 MCG/ACT inhaler Inhale 2 puffs into the lungs 2 (two) times daily. 12/04/18 12/04/19  Rigoberto Noel, MD  Cholecalciferol (VITAMIN D3) 50 MCG (2000 UT) TABS Take 2,000 Units by mouth daily.    [provider]  dicyclomine (BENTYL) 20 MG tablet Take by mouth. 08/07/19   [provider]  docusate sodium (COLACE) 100 MG capsule Take 100 mg by mouth daily as needed for mild constipation.    [provider]  doxepin (SINEQUAN) 25 MG capsule Take 25 mg by mouth at bedtime as needed. 01/30/19   [provider]  entecavir (BARACLUDE) 0.5 MG tablet Take 0.5 mg by mouth every other day. 11/25/19   [provider]  ergocalciferol (VITAMIN D2) 1.25 MG (50000 UT) capsule Take by mouth. 09/27/19   [provider]  Eszopiclone (ESZOPICLONE) 3 MG TABS Take 3 mg by mouth at bedtime. Take immediately before bedtime    [provider]  famotidine (PEPCID) 20 MG tablet Take 20 mg by mouth 2 (two) times daily. 10/28/19   [provider]  FANAPT 2 MG TABS Take 2-4 mg by mouth See admin instructions. Take 2 mg by mouth in the morning for 7 days, then increase to 4 mg thereafter 12/29/19   [provider]  furosemide (LASIX) 20 MG tablet Take one tablet twice daily for 3 days then one tablet daily after that until gone. Patient taking differently: Take 20 mg by mouth daily as needed for edema. 11/01/19   Charlann Lange, PA-C  guaiFENesin (MUCINEX) 600 MG 12 hr tablet Take 1 tablet (600 mg total) by mouth 2  (two) times daily as needed. Patient taking differently: Take 600 mg by mouth 2 (two) times daily as needed for to loosen phlegm. 12/31/19   Maudie Flakes, MD  ibuprofen (ADVIL,MOTRIN) 800 MG tablet Take 1 tablet (800 mg total) by mouth 3 (three) times daily. Patient not taking: Reported on 01/04/2020 11/12/15   de Villier, Daryl F II, PA  levocetirizine (XYZAL) 5  MG tablet Take 5 mg by mouth every evening. 12/02/19   [provider]  Magnesium 100 MG CAPS Take 100 mg by mouth daily as needed (nerve pain).    [provider]  magnesium hydroxide (MILK OF MAGNESIA) 400 MG/5ML suspension Take 30 mLs by mouth daily as needed for mild constipation.    [provider]  montelukast (SINGULAIR) 10 MG tablet Take 10 mg by mouth at bedtime.    [provider]  Multiple Vitamins-Minerals (MULTIVITAMIN WOMEN 50+) TABS Take 1 tablet by mouth daily.    [provider]  naloxone [facility]) 4 MG/0.1ML LIQD nasal spray kit Place 4 mg into the nose once as needed (for accidental overdose). 07/01/19   [provider]  ondansetron (ZOFRAN-ODT) 8 MG disintegrating tablet Take 8 mg by mouth every 8 (eight) hours as needed for nausea or vomiting (dissolve orally). 09/18/19   [provider]  Oxycodone HCl 10 MG TABS Take 10 mg by mouth See admin instructions. Take 10 mg by mouth every four to six hours as needed for pain 10/19/18   [provider]  oxyCODONE-acetaminophen (PERCOCET/ROXICET) 5-325 MG tablet Take 1 tablet by mouth every 4 (four) hours as needed for severe pain. 03/26/20   Hilts, Legrand Como, MD  pantoprazole (PROTONIX) 40 MG tablet Take 40 mg by mouth 2 (two) times daily before a meal. 12/23/13   [provider]  polyethylene glycol powder (GLYCOLAX/MIRALAX) 17 GM/SCOOP powder Take 17 g by mouth daily as needed for mild constipation or moderate constipation (MIX AND DRINK). 07/08/19   [provider]  Potassium 99 MG TABS Take 99 mg  by mouth daily.    [provider]  pregabalin (LYRICA) 75 MG capsule Take 75 mg by mouth 3 (three) times daily. 10/09/19   [provider]  RELISTOR 150 MG TABS Take 450 mg by mouth daily. 12/15/19   [provider]  rivaroxaban (XARELTO) 20 MG TABS tablet Take 20 mg by mouth daily.    [provider]  sucralfate (CARAFATE) 1 g tablet Take 1 g by mouth 4 (four) times daily. 07/22/19   [provider]  valACYclovir (VALTREX) 500 MG tablet Take 500 mg by mouth 2 (two) times daily. 10/22/19   [provider]  enoxaparin (LOVENOX) 120 MG/0.8ML SOLN Inject 0.8 mLs (120 mg total) into the skin every 12 (twelve) hours. 11/30/10 04/16/11  Jenelle Mages, MD  loratadine (CLARITIN) 10 MG tablet Take 10 mg by mouth daily.    04/16/11  [provider]    Allergies    Amitriptyline hcl, Azithromycin, Fentanyl, Gabapentin, Hydromorphone hcl, Cymbalta [duloxetine hcl], Dilaudid [hydromorphone], and Neomycin-bacitracin zn-polymyx  Review of Systems   Review of Systems  Constitutional: Negative for chills and fever.  HENT: Negative for sore throat.   Eyes: Negative for redness.  Respiratory: Positive for cough. Negative for shortness of breath.   Cardiovascular: Positive for chest pain. Negative for palpitations and leg swelling.  Gastrointestinal: Positive for abdominal pain. Negative for diarrhea and vomiting.  Genitourinary: Negative for dysuria, flank pain and hematuria.  Musculoskeletal: Negative for back pain and neck pain.  Skin: Positive for rash.  Neurological: Negative for headaches.  Hematological: Does not bruise/bleed easily.  Psychiatric/Behavioral: Negative for confusion.    Physical Exam Updated Vital Signs BP (!) 197/95   Pulse 79   Temp 97.8 F (36.6 C) (Oral)   Resp 16   Ht 1.626 m (5' 4" )   Wt 108.9 kg   SpO2  98%   BMI 41.20 kg/m   Physical Exam Vitals and nursing note reviewed.  Constitutional:       Appearance: Normal appearance. She is well-developed.  HENT:     Head: Atraumatic.     Nose: Nose normal.     Mouth/Throat:     Mouth: Mucous membranes are moist.  Eyes:     General: No scleral icterus.    Conjunctiva/sclera: Conjunctivae normal.  Neck:     Trachea: No tracheal deviation.  Cardiovascular:     Rate and Rhythm: Normal rate and regular rhythm.     Pulses: Normal pulses.     Heart sounds: Normal heart sounds. No murmur heard. No friction rub. No gallop.   Pulmonary:     Effort: Pulmonary effort is normal. No respiratory distress.     Breath sounds: Wheezing present.     Comments: Normal chest movement, no crepitus. Mild wheezing, good air movement.  Chest:     Chest wall: Tenderness present.  Abdominal:     General: Bowel sounds are normal. There is no distension.     Palpations: Abdomen is soft. There is no mass.     Tenderness: There is no abdominal tenderness. There is no guarding or rebound.     Hernia: No hernia is present.  Genitourinary:    Comments: No cva tenderness.  Musculoskeletal:        General: No swelling or tenderness.     Cervical back: Normal range of motion and neck supple. No rigidity. No muscular tenderness.  Skin:    General: Skin is warm and dry.     Comments: Mild fungal dermatitis bilateral axillary creases.   Neurological:     Mental Status: She is alert.     Comments: Alert, speech normal.   Psychiatric:        Mood and Affect: Mood normal.     ED Results / Procedures / Treatments   Labs (all labs ordered are listed, but only abnormal results are displayed) Results for orders placed or performed during the hospital encounter of 04/29/20  Lipase, blood  Result Value Ref Range   Lipase 45 11 - 51 U/L  Comprehensive metabolic panel  Result Value Ref Range   Sodium 140 135 - 145 mmol/L   Potassium 4.0 3.5 - 5.1 mmol/L   Chloride 104 98 - 111 mmol/L   CO2 27 22 - 32 mmol/L   Glucose, Bld 99 70 - 99 mg/dL   BUN 10 8 - 23  mg/dL   Creatinine, Ser 1.22 (H) 0.44 - 1.00 mg/dL   Calcium 9.6 8.9 - 10.3 mg/dL   Total Protein 7.5 6.5 - 8.1 g/dL   Albumin 4.0 3.5 - 5.0 g/dL   AST 25 15 - 41 U/L   ALT 21 0 - 44 U/L   Alkaline Phosphatase 98 38 - 126 U/L   Total Bilirubin 0.6 0.3 - 1.2 mg/dL   GFR, Estimated 50 (L) >60 mL/min   Anion gap 9 5 - 15  CBC  Result Value Ref Range   WBC 4.9 4.0 - 10.5 K/uL   RBC 4.82 3.87 - 5.11 MIL/uL   Hemoglobin 12.2 12.0 - 15.0 g/dL   HCT 40.1 36.0 - 46.0 %   MCV 83.2 80.0 - 100.0 fL   MCH 25.3 (L) 26.0 - 34.0 pg   MCHC 30.4 30.0 - 36.0 g/dL   RDW 17.3 (H) 11.5 - 15.5 %   Platelets 161 150 - 400 K/uL   nRBC  0.0 0.0 - 0.2 %  Urinalysis, Routine w reflex microscopic Urine, Clean Catch  Result Value Ref Range   Color, Urine YELLOW YELLOW   APPearance HAZY (A) CLEAR   Specific Gravity, Urine 1.016 1.005 - 1.030   pH 6.0 5.0 - 8.0   Glucose, UA NEGATIVE NEGATIVE mg/dL   Hgb urine dipstick NEGATIVE NEGATIVE   Bilirubin Urine NEGATIVE NEGATIVE   Ketones, ur NEGATIVE NEGATIVE mg/dL   Protein, ur 100 (A) NEGATIVE mg/dL   Nitrite NEGATIVE NEGATIVE   Leukocytes,Ua LARGE (A) NEGATIVE   RBC / HPF 0-5 0 - 5 RBC/hpf   WBC, UA 11-20 0 - 5 WBC/hpf   Bacteria, UA FEW (A) NONE SEEN   Squamous Epithelial / LPF 21-50 0 - 5   Mucus PRESENT    Non Squamous Epithelial 0-5 (A) NONE SEEN  Troponin I (High Sensitivity)  Result Value Ref Range   Troponin I (High Sensitivity) 7 <18 ng/L    EKG EKG Interpretation  Date/Time:  Thursday April 29 2020 09:53:28 EDT Ventricular Rate:  66 PR Interval:  165 QRS Duration: 94 QT Interval:  391 QTC Calculation: 407 R Axis:   6 Text Interpretation: Sinus rhythm Confirmed by Lajean Saver [phone]) on 04/29/2020 10:06:22 AM   Radiology DG Chest 2 View  Result Date: 04/29/2020 CLINICAL DATA:  Patient c/o chest pain/congestion x 2-3 days. Patient states a productive cough with yellow sputum. Patient states she was hospitalized last week with  pneumonia. Patient also c/o lower abdominal pain and nausea x 2-3 days. EXAM: CHEST - 2 VIEW COMPARISON:  03/04/2020 FINDINGS: Cardiac silhouette is normal in size. No mediastinal or hilar masses. No evidence of adenopathy. Mild linear scarring or atelectasis at the left lung base, stable. Lungs otherwise clear. No pleural effusion or pneumothorax. Skeletal structures are intact. IMPRESSION: No active cardiopulmonary disease. Electronically Signed   By: Lajean Manes M.D.   On: 04/29/2020 10:23    Procedures Procedures   Medications Ordered in ED Medications  albuterol (VENTOLIN HFA) 108 (90 Base) MCG/ACT inhaler 4 puff (has no administration in time range)  ipratropium (ATROVENT HFA) inhaler 2 puff (has no administration in time range)  ondansetron (ZOFRAN) injection 4 mg (has no administration in time range)  morphine 4 MG/ML injection 4 mg (has no administration in time range)    ED Course  I have reviewed the triage vital signs and the nursing notes.  Pertinent labs & imaging results that were available during my care of the patient were reviewed by me and considered in my medical decision making (see chart for details).    MDM Rules/Calculators/A&P                         Iv ns. Continuous pulse ox and cardiac monitoring. Stat labs. Imaging.  Reviewed nursing notes and prior charts for additional history. Recent ct abd/pelvis for same symptoms at outside facility 3/27 - neg for acute process.   Labs reviewed/interpreted by me - wbc normal.  CXR reviewed/interpreted by me - no pna.   Albuterol and atrovent treatment given.  Recheck no wheezing or increased wob.   Additional labs reviewed/interpreted by me - ua w possible uti, cx sent, rocephin iv. Po fluids/food provided. Trop is normal - after constant symptoms for days, not felt c/w acs.   Recheck, abd soft non tender, eating and drinking. Pt currently appears stable for d/c.   rec pcp f/u.   Return precautions provided.  Final Clinical Impression(s) / ED Diagnoses Final diagnoses:  None    Rx / DC Orders ED Discharge Orders    None       Lajean Saver, MD 04/29/20 1255

## 2020-04-29 NOTE — Discharge Instructions (Addendum)
It was our pleasure to provide your ER care today - we hope that you feel better.  Your lab work shows a possible urine infection - take antibiotic as prescribed.   For armpit area rash, keep skin clean and dry - use clotrimazole cream as prescribed.   Take acetaminophen or ibuprofen as need.  Follow up with primary care doctor in 1 week.   Return to ER if worse, new symptoms, fevers, new, worsening or severe pain, persistent vomiting, trouble breathing, or other concern.   You were given pain medication in the ER - no driving for the next 8 hours.

## 2020-05-01 LAB — URINE CULTURE: Culture: 10000 — AB

## 2020-05-02 ENCOUNTER — Telehealth: Payer: Self-pay | Admitting: Emergency Medicine

## 2020-05-02 NOTE — Progress Notes (Signed)
ED Antimicrobial Stewardship Positive Culture Follow Up   [name] is an 65 y.o. female who presented to [facility] on 04/29/2020 with a chief complaint of  Chief Complaint  Patient presents with  . Abdominal Pain  . Rash  . chest congestion    Recent Results (from the past 720 hour(s))  Urine Culture     Status: Abnormal   Collection Time: 04/29/20  8:31 AM   Specimen: Urine, Clean Catch  Result Value Ref Range Status   Specimen Description   Final    URINE, CLEAN CATCH Performed at [facility], [place] [address] [place], [place] [address]    Special Requests   Final    NONE Performed at [facility], [place] [address], [place], [place] [address]    Culture 10,000 COLONIES/mL ENTEROCOCCUS FAECALIS (A)  Final   Report Status 05/01/2020 FINAL  Final   Organism ID, Bacteria ENTEROCOCCUS FAECALIS (A)  Final      Susceptibility   Enterococcus faecalis - MIC*    AMPICILLIN <=2 SENSITIVE Sensitive     NITROFURANTOIN <=16 SENSITIVE Sensitive     VANCOMYCIN 1 SENSITIVE Sensitive     * 10,000 COLONIES/mL ENTEROCOCCUS FAECALIS    Cephalexin will not cover enterococcus, however, Urine culture growing 10K colonies of enterococcus considered insignificant growth for UTI.   Plan: No changes at this time  ED Provider: Sherol Dade, PA-C   [place] 05/02/2020, 1:14 PM Clinical Pharmacist [phone]

## 2020-05-02 NOTE — Telephone Encounter (Signed)
Post ED Visit - Positive Culture Follow-up  Culture report reviewed by antimicrobial stewardship pharmacist: [place] Team []  Elenor Quinones, Pharm.D. []  Heide Guile, Pharm.D., BCPS AQ-ID []  Parks Neptune, Pharm.D., BCPS []  Alycia Rossetti, Pharm.D., BCPS []  [place], Pharm.D., BCPS, AAHIVP []  Legrand Como, Pharm.D., BCPS, AAHIVP []  Salome Arnt, PharmD, BCPS []  Johnnette Gourd, PharmD, BCPS []  Hughes Better, PharmD, BCPS []  Leeroy Cha, PharmD []  Laqueta Linden, PharmD, BCPS []  Albertina Parr, PharmD  [place] Team []  Leodis Sias, PharmD []  Lindell Spar, PharmD []  Royetta Asal, PharmD []  Graylin Shiver, Rph []  Rema Fendt) Glennon Mac, PharmD []  Arlyn Dunning, PharmD []  Netta Cedars, PharmD []  Dia Sitter, PharmD []  Leone Haven, PharmD []  Gretta Arab, PharmD [x]  Theodis Shove, PharmD []  Peggyann Juba, PharmD []  Reuel Boom, PharmD   Positive urine culture Treated with Cephalexin, organism sensitive to the same and no further patient follow-up is required at this time.  Sandi Raveling Raedyn Klinck 05/02/2020, 4:19 PM

## 2020-05-17 ENCOUNTER — Emergency Department (HOSPITAL_COMMUNITY): Payer: Medicare Other

## 2020-05-17 ENCOUNTER — Encounter (HOSPITAL_COMMUNITY): Payer: Self-pay | Admitting: Emergency Medicine

## 2020-05-17 ENCOUNTER — Emergency Department (HOSPITAL_COMMUNITY)
Admission: EM | Admit: 2020-05-17 | Discharge: 2020-05-17 | Disposition: A | Discharge status: Home / Self Care | Payer: Medicare Other | Attending: Emergency Medicine | Admitting: Emergency Medicine

## 2020-05-17 ENCOUNTER — Emergency Department (HOSPITAL_BASED_OUTPATIENT_CLINIC_OR_DEPARTMENT_OTHER): Payer: Medicare Other

## 2020-05-17 ENCOUNTER — Other Ambulatory Visit: Payer: Self-pay

## 2020-05-17 DIAGNOSIS — Z7901 Long term (current) use of anticoagulants: Secondary | ICD-10-CM | POA: Insufficient documentation

## 2020-05-17 DIAGNOSIS — M79604 Pain in right leg: Secondary | ICD-10-CM

## 2020-05-17 DIAGNOSIS — M7989 Other specified soft tissue disorders: Secondary | ICD-10-CM

## 2020-05-17 DIAGNOSIS — I129 Hypertensive chronic kidney disease with stage 1 through stage 4 chronic kidney disease, or unspecified chronic kidney disease: Secondary | ICD-10-CM | POA: Diagnosis not present

## 2020-05-17 DIAGNOSIS — N183 Chronic kidney disease, stage 3 unspecified: Secondary | ICD-10-CM | POA: Insufficient documentation

## 2020-05-17 DIAGNOSIS — Z79899 Other long term (current) drug therapy: Secondary | ICD-10-CM | POA: Insufficient documentation

## 2020-05-17 DIAGNOSIS — Z87891 Personal history of nicotine dependence: Secondary | ICD-10-CM | POA: Diagnosis not present

## 2020-05-17 DIAGNOSIS — Z20822 Contact with and (suspected) exposure to covid-19: Secondary | ICD-10-CM | POA: Diagnosis not present

## 2020-05-17 DIAGNOSIS — R0602 Shortness of breath: Secondary | ICD-10-CM | POA: Diagnosis present

## 2020-05-17 DIAGNOSIS — R609 Edema, unspecified: Secondary | ICD-10-CM

## 2020-05-17 DIAGNOSIS — J441 Chronic obstructive pulmonary disease with (acute) exacerbation: Secondary | ICD-10-CM

## 2020-05-17 DIAGNOSIS — Z85831 Personal history of malignant neoplasm of soft tissue: Secondary | ICD-10-CM | POA: Insufficient documentation

## 2020-05-17 LAB — COMPREHENSIVE METABOLIC PANEL
ALT: 30 U/L (ref 0–44)
AST: 25 U/L (ref 15–41)
Albumin: 3.7 g/dL (ref 3.5–5.0)
Alkaline Phosphatase: 83 U/L (ref 38–126)
Anion gap: 7 (ref 5–15)
BUN: 18 mg/dL (ref 8–23)
CO2: 25 mmol/L (ref 22–32)
Calcium: 9.5 mg/dL (ref 8.9–10.3)
Chloride: 105 mmol/L (ref 98–111)
Creatinine, Ser: 1.24 mg/dL — ABNORMAL HIGH (ref 0.44–1.00)
GFR, Estimated: 49 mL/min — ABNORMAL LOW (ref >60)
Glucose, Bld: 126 mg/dL — ABNORMAL HIGH (ref 70–99)
Potassium: 3.5 mmol/L (ref 3.5–5.1)
Sodium: 137 mmol/L (ref 135–145)
Total Bilirubin: 0.3 mg/dL (ref 0.3–1.2)
Total Protein: 6.9 g/dL (ref 6.5–8.1)

## 2020-05-17 LAB — CBC WITH DIFFERENTIAL/PLATELET
Abs Immature Granulocytes: 0.01 10*3/uL (ref 0.00–0.07)
Basophils Absolute: 0 10*3/uL (ref 0.0–0.1)
Basophils Relative: 0 %
Eosinophils Absolute: 0.1 10*3/uL (ref 0.0–0.5)
Eosinophils Relative: 2 %
HCT: 33.7 % — ABNORMAL LOW (ref 36.0–46.0)
Hemoglobin: 10.3 g/dL — ABNORMAL LOW (ref 12.0–15.0)
Immature Granulocytes: 0 %
Lymphocytes Relative: 29 %
Lymphs Abs: 1.2 10*3/uL (ref 0.7–4.0)
MCH: 25.5 pg — ABNORMAL LOW (ref 26.0–34.0)
MCHC: 30.6 g/dL (ref 30.0–36.0)
MCV: 83.4 fL (ref 80.0–100.0)
Monocytes Absolute: 0.4 10*3/uL (ref 0.1–1.0)
Monocytes Relative: 9 %
Neutro Abs: 2.5 10*3/uL (ref 1.7–7.7)
Neutrophils Relative %: 60 %
Platelets: 145 10*3/uL — ABNORMAL LOW (ref 150–400)
RBC: 4.04 MIL/uL (ref 3.87–5.11)
RDW: 17.2 % — ABNORMAL HIGH (ref 11.5–15.5)
WBC: 4.1 10*3/uL (ref 4.0–10.5)
nRBC: 0 % (ref 0.0–0.2)

## 2020-05-17 LAB — RESP PANEL BY RT-PCR (FLU A&B, COVID) ARPGX2
Influenza A by PCR: NEGATIVE
Influenza B by PCR: NEGATIVE
SARS Coronavirus 2 by RT PCR: NEGATIVE

## 2020-05-17 LAB — BRAIN NATRIURETIC PEPTIDE: B Natriuretic Peptide: 24.7 pg/mL (ref 0.0–100.0)

## 2020-05-17 LAB — URINALYSIS, ROUTINE W REFLEX MICROSCOPIC
Bacteria, UA: NONE SEEN
Bilirubin Urine: NEGATIVE
Glucose, UA: NEGATIVE mg/dL
Hgb urine dipstick: NEGATIVE
Ketones, ur: NEGATIVE mg/dL
Nitrite: NEGATIVE
Protein, ur: NEGATIVE mg/dL
Specific Gravity, Urine: 1.004 — ABNORMAL LOW (ref 1.005–1.030)
pH: 6 (ref 5.0–8.0)

## 2020-05-17 LAB — TROPONIN I (HIGH SENSITIVITY)
Troponin I (High Sensitivity): 6 ng/L (ref <18)
Troponin I (High Sensitivity): 6 ng/L (ref <18)

## 2020-05-17 MED ORDER — IPRATROPIUM-ALBUTEROL 0.5-2.5 (3) MG/3ML IN SOLN
3.0000 mL | Freq: Once | RESPIRATORY_TRACT | Status: AC
  Administered 2020-05-17: 3 mL via RESPIRATORY_TRACT
  Filled 2020-05-17: qty 3

## 2020-05-17 MED ORDER — ALBUTEROL SULFATE HFA 108 (90 BASE) MCG/ACT IN AERS
2.0000 | INHALATION_SPRAY | Freq: Once | RESPIRATORY_TRACT | Status: AC
  Administered 2020-05-17: 2 via RESPIRATORY_TRACT
  Filled 2020-05-17: qty 6.7

## 2020-05-17 MED ORDER — METHYLPREDNISOLONE SODIUM SUCC 125 MG IJ SOLR
80.0000 mg | Freq: Once | INTRAMUSCULAR | Status: AC
  Administered 2020-05-17: 80 mg via INTRAVENOUS
  Filled 2020-05-17: qty 2

## 2020-05-17 MED ORDER — ALBUTEROL SULFATE HFA 108 (90 BASE) MCG/ACT IN AERS
4.0000 | INHALATION_SPRAY | Freq: Once | RESPIRATORY_TRACT | Status: AC
  Administered 2020-05-17: 4 via RESPIRATORY_TRACT
  Filled 2020-05-17: qty 6.7

## 2020-05-17 MED ORDER — IOHEXOL 350 MG/ML SOLN
100.0000 mL | Freq: Once | INTRAVENOUS | Status: AC | PRN
  Administered 2020-05-17: 100 mL via INTRAVENOUS

## 2020-05-17 MED ORDER — DOXYCYCLINE HYCLATE 100 MG PO CAPS: 100.0000 mg | ORAL_CAPSULE | Freq: Two times a day (BID) | ORAL | 0 refills | Status: AC

## 2020-05-17 MED ORDER — MORPHINE SULFATE (PF) 2 MG/ML IV SOLN
2.0000 mg | Freq: Once | INTRAVENOUS | Status: AC
  Administered 2020-05-17: 2 mg via INTRAVENOUS
  Filled 2020-05-17: qty 1

## 2020-05-17 MED ORDER — SODIUM CHLORIDE 0.9 % IV SOLN
1.0000 g | Freq: Once | INTRAVENOUS | Status: AC
  Administered 2020-05-17: 1 g via INTRAVENOUS
  Filled 2020-05-17: qty 10

## 2020-05-17 MED ORDER — FUROSEMIDE 10 MG/ML IJ SOLN
20.0000 mg | Freq: Once | INTRAMUSCULAR | Status: AC
  Administered 2020-05-17: 20 mg via INTRAVENOUS
  Filled 2020-05-17: qty 4

## 2020-05-17 NOTE — ED Notes (Signed)
Xray right knee.

## 2020-05-17 NOTE — ED Notes (Signed)
Patient is on a purewick to void.

## 2020-05-17 NOTE — ED Notes (Signed)
Patient is talking on the telephone when checked.  Reports the breathing treatment has helped

## 2020-05-17 NOTE — ED Provider Notes (Signed)
  Physical Exam  BP 116/60 (BP Location: Left Arm)   Pulse 100   Temp 98.6 F (37 C) (Oral)   Resp 18   SpO2 93%   Physical Exam  ED Course/Procedures     Procedures  MDM   Care of patient assumed from preceding ED provider, Providence Lanius, PA-C at time of shift change.  Please see her associated note for further insight and the patient's ED course.  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  Laboratory studies were reassuring , CT angiogram of the chest was negative for acute pulmonary embolus though did reveal questionable acute bronchopneumonia.  At time of shift change patient is awaiting lower extremity venous Doppler to rule out DVT.  If negative plan is for her to be discharged home on doxycycline for her pneumonia; if DVT study is positive, patient will require admission to the hospital for failure on Xarelto, will likely require vascular consult.  Patient states she feels much improved after DuoNeb.  There is mild erythema and tenderness palpation over the anterior right lower extremity without crepitus or open wound.  DVT study negative for right lower extremity DVT.  Now complaining of right knee pain after a [place] approximately 1 week [place] cement.  Will proceed with plain film of the knee.  Assuming it is normal she will be discharged home with doxycycline prescription for her pneumonia and possible cellulitis of the right lower leg.  Plain film of the knee negative for acute fracture or dislocation though there is a small effusion.  Will offer supportive sleeve.  No further work-up warranted in ED at this time.  Patient will be discharged home with course of antibiotics for her new pneumonia and recommend she follow-up with her PCP and pain management providers for further work-up of her right lower leg pain.  Azuri voiced  understanding of her medical evaluation and treatment plan.  Each of her questions was answered to her expressed satisfaction.  Her vital signs remain normal at this time.  Strict return precautions were given.  Patient is well-appearing, stable, and appropriate for discharge at this time.  This chart was dictated using voice recognition software, Dragon. Despite the best efforts of this provider to proofread and correct errors, errors may still occur which can change documentation meaning.    Aura Dials 05/17/20 1012    Blanchie Dessert, MD 05/20/20 2329

## 2020-05-17 NOTE — ED Notes (Signed)
Vascular ultrasound is at the bedside

## 2020-05-17 NOTE — ED Provider Notes (Signed)
[facility] DEPT Provider Note   CSN: [id number] Arrival date & time: 05/17/20  0009     History Chief Complaint  Patient presents with  . Shortness of Breath  . Leg Swelling    [name] is a 65 y.o. female with PMH/o CKD, COPD, DVT (currently on Xarelto), GERD, who presents for evaluation of SOB, bilateral leg edema (R>L).  She reports that she has had bilateral lower extremity edema that has been ongoing for the last several months, her right is always more swollen than her left.  She states that recently, she feels like it has been slightly more swollen and more painful.  She states it is always erythematous.  She states that her shortness of breath has been worsening over the last 2 to 3 days.  She states normally she has to sleep at somewhat of an incline given her history of GERD and states that recently, she has had to sit up straighter.  She states that she is short of breath all the time and feels like it is getting worse.  She also reports that her last 2 to 3 days, she has had some intermittent chest pain.  She states she felt like she had a fever few days ago.  She has had a cough that is productive of phlegm.  No hemoptysis.  She has a history of DVT and PE several years ago.  She is on Xarelto and states she has not missed any of her doses.   The history is provided by the patient.       Past Medical History:  Diagnosis Date  . Agoraphobia with panic attacks   . Altered mental state 10/26/2013  . Ankle fracture, left 11/27/2010   S/p ORIF 11/2   . Anxiety   . Arthritis    "knees; lower back" (10/30/2013)  . Bipolar disorder ([place])   . Cavitary pneumonia   . Cirrhosis ([place])   . CKD (chronic kidney disease), stage III ([place])   . COPD (chronic obstructive pulmonary disease) ([place])   . Depression   . DVT (deep venous thrombosis) ([place]) 09/2013   RLE  . GERD (gastroesophageal reflux disease)   . Heart murmur   . Hepatitis B   . History of  blood transfusion    "due to excessive blood loss before hysterectomy"  . History of urinary tract infection   . Hypertension    currently on no medication   . Kidney stones   . Pneumonia    "4 times in the past year" (10/30/2013)  . Pulmonary emboli ([place]) 09/2013  . Shortness of breath dyspnea    increased exertion;exercise  . Urinary frequency   . Urinary incontinence     Patient Active Problem List   Diagnosis Date Noted  . Severe sepsis with acute organ dysfunction ([place]) 01/04/2020  . Primary osteoarthritis of right knee 12/04/2019  . Primary osteoarthritis of left knee 12/04/2019  . Vitamin D deficiency 09/20/2019  . Cellulitis of foot 09/18/2019  . Aspiration pneumonia of both lower lobes due to gastric secretions ([place]) 06/02/2019  . Lumbar disc herniation 05/04/2019  . Encounter for colorectal cancer screening 04/19/2019  . Acute respiratory distress 04/06/2019  . Hyperglycemia 04/06/2019  . Pleural effusion on right 04/06/2019  . Benzodiazepine dependence ([place]) 02/16/2019  . Cervical stenosis of spinal canal 02/15/2019  . Streptococcal sepsis ([place]) 01/22/2019  . Acute bronchitis due to Rhinovirus 11/06/2018  . Lung collapse 11/01/2018  . 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 Compression fracture of body of thoracic vertebra ([place]) 08/01/2018  . Closed nondisplaced fracture of greater tuberosity of right humerus 07/08/2018  . On rivaroxaban therapy 07/08/2018  . Chronic anticoagulation 05/27/2018  . Falls frequently 05/27/2018  . History of DVT (deep vein thrombosis) 03/16/2018  . History of pulmonary embolism 03/10/2018  . Left lower quadrant abdominal pain 03/10/2018  . Chronic pain syndrome 03/05/2018  . COPD exacerbation ([place]) 06/09/2016  . Abnormal urine odor 09/26/2015  . Recurrent pulmonary embolism ([place]) 06/26/2015  . Lupus anticoagulant positive 06/26/2015  . Thrombocytopenia ([place]) 05/31/2015  . UTI (lower  urinary tract infection) 05/26/2015  . Right flank pain   . Patchy loss of hair 03/08/2015  . Primary osteoarthritis of both knees 02/25/2015  . Incarcerated incisional hernia s/p lap repair w mesh 12/10/2014 12/10/2014  . DVT (deep venous thrombosis) ([place]) 10/31/2013  . Pulmonary embolism ([place]) 10/26/2013  . Kidney stone 10/04/2013  . Tobacco abuse 10/04/2013  . COPD (chronic obstructive pulmonary disease) ([place])   . Unspecified constipation 12/23/2012  . CKD (chronic kidney disease), stage III ([place]) 12/21/2012  . Allergic rhinitis 06/29/2012  . Chronic low back pain 06/29/2012  . Dysuria 06/29/2012  . Edema 06/29/2012  . Gastro-esophageal reflux disease without esophagitis 06/29/2012  . Hemorrhoids 06/29/2012  . Hirsutism 06/29/2012  . Insomnia 06/29/2012  . Localized superficial swelling, mass, or lump 06/29/2012  . Urinary incontinence 06/29/2012  . Skin sensation disturbance 06/29/2012  . Shortness of breath 06/29/2012  . Pulmonary embolism and infarction ([place]) 06/29/2012  . Proteinuria 06/29/2012  . Nondependent cannabis abuse 06/29/2012  . Acquired cyst of kidney 10/24/2011  . 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 Cocaine abuse ([place]) 11/27/2010  . Depressive disorder 09/23/2009  . Ascites 09/23/2009  . Obesity 09/01/2008  . Hepatitis B virus infection 08/31/2008  . Acute hepatitis C virus infection 08/31/2008  . Iron deficiency anemia 08/31/2008  . Anxiety state 08/31/2008  . Essential hypertension 08/31/2008  . Coronary atherosclerosis 08/31/2008  . Hepatic cirrhosis ([place]) 08/31/2008    Past Surgical History:  Procedure Laterality Date  . ABDOMINAL HYSTERECTOMY    . ANKLE FRACTURE SURGERY Left   . CARPAL TUNNEL RELEASE Left   . DILATION AND CURETTAGE OF UTERUS  X 2  . FRACTURE SURGERY    . HAND SURGERY     1980's/left hand   . INSERTION OF MESH N/A 12/10/2014   Procedure: INSERTION OF MESH;  Surgeon: Michael Boston, MD;  Location: [facility] ORS;  Service:  General;  Laterality: N/A;  . LAPAROSCOPIC ASSISTED VENTRAL HERNIA REPAIR N/A 12/10/2014   Procedure: LAPAROSCOPIC ASSISTED REPAIR OF INCARCERATED UMBILICAL HERNIA ;  Surgeon: Michael Boston, MD;  Location: [facility] ORS;  Service: General;  Laterality: N/A;  . TUBAL LIGATION       OB History   No obstetric history on file.     Family History  Problem Relation Age of Onset  . Stroke Mother   . Lung cancer Father     Social History   Tobacco Use  . Smoking status: Former Smoker    Packs/day: 0.50    Years: 35.00    Pack years: 17.50    Types: Cigarettes    Quit date: 10/26/2013    Years since quitting: 6.5  . Smokeless tobacco: Never Used  Vaping Use  . Vaping Use: Some days  . Substances: Nicotine, Flavoring  Substance Use Topics  . Alcohol use: No    Alcohol/week:  0.0 standard drinks    Comment: "stopped drinking  in 2004"  . Drug use: No    Types: "Crack" cocaine, Marijuana    Comment: 10/30/2013 "last crack in 2014; last marijuana in ~ 1990" drug free for last 3 years     Home Medications Prior to Admission medications   Medication Sig Start Date End Date Taking? Authorizing Provider  doxycycline (VIBRAMYCIN) 100 MG capsule Take 1 capsule (100 mg total) by mouth 2 (two) times daily for 7 days. 05/17/20 05/24/20 Yes Volanda Napoleon, PA-C  albuterol (PROAIR HFA) 108 (90 Base) MCG/ACT inhaler INHALE 2 PUFFS INTO THE LUNGS EVERY 4 HOURS AS NEEDED FOR WHEEZING Patient taking differently: Inhale 2 puffs into the lungs every 4 (four) hours as needed for shortness of breath or wheezing. 12/04/18   Rigoberto Noel, MD  albuterol (PROVENTIL) (2.5 MG/3ML) 0.083% nebulizer solution Take 3 mLs (2.5 mg total) by nebulization every 4 (four) hours as needed for wheezing or shortness of breath. 12/04/18   Rigoberto Noel, MD  ALPRAZolam Duanne Moron) 1 MG tablet Take 0.5 tablets (0.5 mg total) by mouth 2 (two) times daily as needed for anxiety. Patient taking differently: Take 1 mg by mouth 3 (three)  times daily. 06/02/15   Elgergawy, Silver Huguenin, MD  amLODipine (NORVASC) 5 MG tablet Take 5 mg by mouth daily. 12/21/19   [provider]  amoxicillin-clavulanate (AUGMENTIN) 875-125 MG tablet TAKE 1 TABLET BY MOUTH TWO TIMES DAILY FOR 2 DAYS. THROUGH 01/11/2020 01/08/20 01/07/21  Charlynne Cousins, MD  ascorbic acid (VITAMIN C) 500 MG tablet Take 500 mg by mouth daily.    [provider]  baclofen (LIORESAL) 10 MG tablet Take 0.5-1 tablets (5-10 mg total) by mouth 3 (three) times daily as needed for muscle spasms. 03/29/20   Hilts, Legrand Como, MD  bisacodyl (DULCOLAX) 5 MG EC tablet Take 10 mg by mouth daily as needed for mild constipation.    [provider]  budesonide-formoterol (SYMBICORT) 160-4.5 MCG/ACT inhaler Inhale 2 puffs into the lungs 2 (two) times daily. 12/04/18 12/04/19  Rigoberto Noel, MD  cephALEXin (KEFLEX) 500 MG capsule Take 1 capsule (500 mg total) by mouth 4 (four) times daily. 04/29/20   Lajean Saver, MD  Cholecalciferol (VITAMIN D3) 50 MCG (2000 UT) TABS Take 2,000 Units by mouth daily.    [provider]  clotrimazole-betamethasone (LOTRISONE) cream Apply to affected area 2 times daily prn 04/29/20   Lajean Saver, MD  dicyclomine (BENTYL) 20 MG tablet Take by mouth. 08/07/19   [provider]  docusate sodium (COLACE) 100 MG capsule Take 100 mg by mouth daily as needed for mild constipation.    [provider]  doxepin (SINEQUAN) 25 MG capsule Take 25 mg by mouth at bedtime as needed. 01/30/19   [provider]  entecavir (BARACLUDE) 0.5 MG tablet Take 0.5 mg by mouth every other day. 11/25/19   [provider]  ergocalciferol (VITAMIN D2) 1.25 MG (50000 UT) capsule Take by mouth. 09/27/19   [provider]  Eszopiclone (ESZOPICLONE) 3 MG TABS Take 3 mg by mouth at bedtime. Take immediately before bedtime    [provider]  famotidine (PEPCID) 20 MG tablet Take 20 mg by mouth 2 (two) times daily.  10/28/19   [provider]  FANAPT 2 MG TABS Take 2-4 mg by mouth See admin instructions. Take 2 mg by mouth in the morning for 7 days, then increase to 4 mg thereafter 12/29/19   [provider]  furosemide (LASIX) 20 MG tablet Take one tablet twice daily for 3 days then one tablet daily after that until gone. Patient taking differently: Take 20 mg by mouth daily as needed for edema. 11/01/19   Charlann Lange, PA-C  guaiFENesin (MUCINEX) 600 MG 12 hr tablet Take 1 tablet (600 mg total) by mouth 2 (two) times daily as needed. Patient taking differently: Take 600 mg by mouth 2 (two) times daily as needed for to loosen phlegm. 12/31/19   Maudie Flakes, MD  ibuprofen (ADVIL,MOTRIN) 800 MG tablet Take 1 tablet (800 mg total) by mouth 3 (three) times daily. Patient not taking: Reported on 01/04/2020 11/12/15   de Villier, Daryl F II, PA  levocetirizine (XYZAL) 5 MG tablet Take 5 mg by mouth every evening. 12/02/19   [provider]  Magnesium 100 MG CAPS Take 100 mg by mouth daily as needed (nerve pain).    [provider]  magnesium hydroxide (MILK OF MAGNESIA) 400 MG/5ML suspension Take 30 mLs by mouth daily as needed for mild constipation.    [provider]  montelukast (SINGULAIR) 10 MG tablet Take 10 mg by mouth at bedtime.    [provider]  Multiple Vitamins-Minerals (MULTIVITAMIN WOMEN 50+) TABS Take 1 tablet by mouth daily.    [provider]  naloxone [facility]) 4 MG/0.1ML LIQD nasal spray kit Place 4 mg into the nose once as needed (for accidental overdose). 07/01/19   [provider]  ondansetron (ZOFRAN-ODT) 8 MG disintegrating tablet Take 8 mg by mouth every 8 (eight) hours as needed for nausea or vomiting (dissolve orally). 09/18/19   [provider]  Oxycodone HCl 10 MG TABS Take 10 mg by mouth See admin instructions. Take 10 mg by mouth every four to six hours as needed for pain 10/19/18   [provider]   oxyCODONE-acetaminophen (PERCOCET/ROXICET) 5-325 MG tablet Take 1 tablet by mouth every 4 (four) hours as needed for severe pain. 03/26/20   Hilts, Legrand Como, MD  pantoprazole (PROTONIX) 40 MG tablet Take 40 mg by mouth 2 (two) times daily before a meal. 12/23/13   [provider]  polyethylene glycol powder (GLYCOLAX/MIRALAX) 17 GM/SCOOP powder Take 17 g by mouth daily as needed for mild constipation or moderate constipation (MIX AND DRINK). 07/08/19   [provider]  Potassium 99 MG TABS Take 99 mg by mouth daily.    [provider]  pregabalin (LYRICA) 75 MG capsule Take 75 mg by mouth 3 (three) times daily. 10/09/19   [provider]  RELISTOR 150 MG TABS Take 450 mg by mouth daily. 12/15/19   [provider]  rivaroxaban (XARELTO) 20 MG TABS tablet Take 20 mg by mouth daily.    [provider]  sucralfate (CARAFATE) 1 g tablet Take 1 g by mouth 4 (four) times daily. 07/22/19   [provider]  valACYclovir (VALTREX) 500 MG tablet Take 500 mg by mouth 2 (two) times daily. 10/22/19   [provider]  enoxaparin (LOVENOX) 120 MG/0.8ML SOLN Inject 0.8 mLs (120 mg total) into the skin every 12 (twelve) hours. 11/30/10 04/16/11  Jenelle Mages, MD  loratadine (CLARITIN) 10 MG tablet Take 10 mg by mouth daily.    04/16/11  [provider]    Allergies    Amitriptyline hcl, Azithromycin, Fentanyl, Gabapentin, Hydromorphone hcl, Cymbalta [duloxetine hcl], Dilaudid [hydromorphone], and Neomycin-bacitracin zn-polymyx  Review of Systems   Review of Systems  Respiratory: Positive for cough and shortness  of breath.   Cardiovascular: Positive for chest pain and leg swelling.  Gastrointestinal: Negative for abdominal pain, nausea and vomiting.  Genitourinary: Negative for dysuria and hematuria.  Skin: Positive for color change.  Neurological: Negative for headaches.  All other systems reviewed and are  negative.   Physical Exam Updated Vital Signs BP (!) 147/90   Pulse 86   Temp 98.6 F (37 C) (Oral)   Resp 16   SpO2 100%   Physical Exam Vitals and nursing note reviewed.  Constitutional:      Appearance: Normal appearance. She is well-developed.  HENT:     Head: Normocephalic and atraumatic.  Eyes:     General: Lids are normal.     Conjunctiva/sclera: Conjunctivae normal.     Pupils: Pupils are equal, round, and reactive to light.  Cardiovascular:     Rate and Rhythm: Normal rate and regular rhythm.     Pulses: Normal pulses.          Radial pulses are 2+ on the right side and 2+ on the left side.       Dorsalis pedis pulses are 2+ on the right side and 2+ on the left side.     Heart sounds: Normal heart sounds. No murmur heard. No friction rub. No gallop.   Pulmonary:     Effort: Pulmonary effort is normal.     Breath sounds: Wheezing, rhonchi and rales present.     Comments: Audible wheezing noted.  Diffuse expiratory wheezing noted throughout all lung fields, diffuse rhonchi, rales noted bilateral lung fields particularly from the mid area extends distally. Abdominal:     Palpations: Abdomen is soft. Abdomen is not rigid.     Tenderness: There is no abdominal tenderness. There is no guarding.     Comments: Abdomen is soft, non-distended, non-tender. No rigidity, No guarding. No peritoneal signs.  Musculoskeletal:        General: Normal range of motion.     Cervical back: Full passive range of motion without pain.     Comments: 2+ pitting edema to the right lower extremity with some mild overlying warmth, erythema.  1+ pitting edema noted to left lower extremity.  Skin:    General: Skin is warm and dry.     Capillary Refill: Capillary refill takes less than 2 seconds.  Neurological:     Mental Status: She is alert and oriented to person, place, and time.  Psychiatric:        Speech: Speech normal.     ED Results / Procedures / Treatments   Labs (all labs  ordered are listed, but only abnormal results are displayed) Labs Reviewed  COMPREHENSIVE METABOLIC PANEL - Abnormal; Notable for the following components:      Result Value   Glucose, Bld 126 (*)    Creatinine, Ser 1.24 (*)    GFR, Estimated 49 (*)    All other components within normal limits  CBC WITH DIFFERENTIAL/PLATELET - Abnormal; Notable for the following components:   Hemoglobin 10.3 (*)    HCT 33.7 (*)    MCH 25.5 (*)    RDW 17.2 (*)    Platelets 145 (*)    All other components within normal limits  URINALYSIS, ROUTINE W REFLEX MICROSCOPIC - Abnormal; Notable for the following components:   Color, Urine COLORLESS (*)    Specific Gravity, Urine 1.004 (*)    Leukocytes,Ua TRACE (*)    All other components within normal limits  RESP PANEL BY RT-PCR (FLU  A&B, COVID) ARPGX2  BRAIN NATRIURETIC PEPTIDE  TROPONIN I (HIGH SENSITIVITY)  TROPONIN I (HIGH SENSITIVITY)    EKG None  Radiology DG Chest 2 View  Result Date: 05/17/2020 CLINICAL DATA:  Shortness of breath.  Leg swelling. EXAM: CHEST - 2 VIEW COMPARISON:  Chest x-ray 04/29/2020, CT angiography 12/31/2019, CT angiography 01/01/2016 FINDINGS: The heart size and mediastinal contours are unchanged. Aortic arch calcifications. Similar-appearing prominent ascending aorta. Persistent enlarged pulmonary artery. No focal consolidation. No pulmonary edema. No pleural effusion. No pneumothorax. No acute osseous abnormality. Cervical spine left side surgical hardware IMPRESSION: No acute cardiopulmonary abnormality. Electronically Signed   By: Iven Finn M.D.   On: 05/17/2020 01:38   CT Angio Chest PE W and/or Wo Contrast  Result Date: 05/17/2020 CLINICAL DATA:  65 year old female with chest pain, shortness of breath and lower extremity swelling. Abnormal pulmonary auscultation, crackles. EXAM: CT ANGIOGRAPHY CHEST WITH CONTRAST TECHNIQUE: Multidetector CT imaging of the chest was performed using the standard protocol during bolus  administration of intravenous contrast. Multiplanar CT image reconstructions and MIPs were obtained to evaluate the vascular anatomy. CONTRAST:  19m OMNIPAQUE IOHEXOL 350 MG/ML SOLN COMPARISON:  Chest CTA 12/31/2019. Chest radiographs 0109 hours today. FINDINGS: Adequate contrast timing in the pulmonary artery system. Asymmetric central pulmonary artery enlargement appear stable since December greater on the left. Mild to moderate respiratory motion today. No central or hilar pulmonary artery filling defect. Segmental and distal upper lobe branch detail relatively maintained, but middle and lower lobe branches are obscured. No cardiomegaly or pericardial effusion. Little contrast in the aorta. No calcified coronary artery atherosclerosis is evident. Mediastinum is negative.  No lymphadenopathy. Lobulated liver contour were consistent with cirrhosis. Partially visible splenomegaly. Visible upper abdomen appears stable since December. Stable lung volumes compared to December. Chronic right apical lung scarring is stable. But there is a small new area of distal peribronchial solid and sub solid nodularity in the posterior right upper lobe (series 6, image 41). Atelectatic changes to the central airways are stable. There is mild new peripheral ground-glass opacity in the superior segment of the left lower lobe on series 6, image 74. Stable atelectasis or scarring in both costophrenic angles. No pleural effusion. No pulmonary edema. Stable visible osseous structures with chronically advanced disc and endplate degeneration throughout the thoracic spine. Review of the MIP images confirms the above findings. IMPRESSION: 1. No acute pulmonary embolus identified. Middle and lower lobe pulmonary artery branches are obscured by motion. 2. New peribronchial and peripheral opacity in the right upper and the left lower lobes is suspicious for mild acute bronchopneumonia. No pulmonary edema or pleural effusion. 3. Stable  asymmetric central pulmonary artery enlargement suggesting a degree of pulmonary artery hypertension. 4.  Stable partially visible Cirrhosis. Electronically Signed   By: HGenevie AnnM.D.   On: 05/17/2020 05:39    Procedures Procedures   Medications Ordered in ED Medications  furosemide (LASIX) injection 20 mg (20 mg Intravenous Given 05/17/20 0138)  albuterol (VENTOLIN HFA) 108 (90 Base) MCG/ACT inhaler 4 puff (4 puffs Inhalation Given 05/17/20 0140)  morphine 2 MG/ML injection 2 mg (2 mg Intravenous Given 05/17/20 0248)  iohexol (OMNIPAQUE) 350 MG/ML injection 100 mL (100 mLs Intravenous Contrast Given 05/17/20 0448)  methylPREDNISolone sodium succinate (SOLU-MEDROL) 125 mg/2 mL injection 80 mg (80 mg Intravenous Given 05/17/20 0607)  morphine 2 MG/ML injection 2 mg (2 mg Intravenous Given 05/17/20 0607)  cefTRIAXone (ROCEPHIN) 1 g in sodium chloride 0.9 % 100 mL IVPB (1 g Intravenous  New Bag/Given 05/17/20 0649)  ipratropium-albuterol (DUONEB) 0.5-2.5 (3) MG/3ML nebulizer solution 3 mL (3 mLs Nebulization Given 05/17/20 9476)    ED Course  I have reviewed the triage vital signs and the nursing notes.  Pertinent labs & imaging results that were available during my care of the patient were reviewed by me and considered in my medical decision making (see chart for details).    MDM Rules/Calculators/A&P                          65 year old female who presents for evaluation of shortness of breath, leg swelling.  Reports that leg swelling has been ongoing for several months.  She has been taking Lasix and spironolactone.  They recently took her off of the Lasix and just had her do spironolactone.  She states she feels like it is getting worse, particularly the right 1 which is always slightly worse.  History of DVT and PE and states she has been taking her Xarelto and has not missed any doses.  Also reports that over the last 2 to 3 days, she has had worsening shortness of breath, chest pain.  She has  had a cough as well.  She does vape.  On initial arrival, she is afebrile, slightly tachycardic, tachypneic.  O2 sats are stable.  On exam, she has audible wheezing noted as well as diffuse expiratory wheezing, rales, rhonchi noted.  She has edema both lower extremities but right is slightly worse than the left with some mild overlying warmth, erythema. Concern for infectious process versus CHF.  Also consider PE.  She does report she has been on her Xarelto and has not missed any doses.  History/physical exam not concerning for ACS. Plan for labs, CXR.  Troponin negative.  COVID-negative.  BMP is normal.  UA shows no infectious etiology.  CBC shows no leukocytosis.  Hemoglobin is 10.3.  CMP shows normal BUN and creatinine.  Chest x-ray shows no acute abnormality.  Reevaluation.  Patient does sound slightly better after albuterol here in the ED.  She still feels like she is having to sit up and is more short of breath.  She tells me she has not missed any doses of her Xarelto.  We will plan for CTA.  CTA shows no evidence of PE.  There is some motion artifact which obscures the middle and lower lobe.  She also has new parabronchial.  Peripheral opacity in the right upper lobe that could be acute bronchopneumonia.  Patient given DuoNeb here in the ED.  Question if this is infectious process as seen on CT scan versus COPD exacerbation.  We will plan to treat.  Patient with worsening lower extremity edema, right, greater than left.  Right lower extremity is erythematous, warm.  Patient does have a history of DVTs.  We will plan for ultrasound to ensure there is not a DVT failed on Xarelto that would require admission, IVC filter.  Patient signed to oncoming provider, Rod Can, PA-C.   Portions of this note were generated with [employer]. Dictation errors may occur despite best attempts at proofreading.    Final Clinical Impression(s) / ED Diagnoses Final diagnoses:  COPD  exacerbation ([place])  Leg swelling    Rx / DC Orders ED Discharge Orders         Ordered    doxycycline (VIBRAMYCIN) 100 MG capsule  2 times daily        05/17/20 0707  Volanda Napoleon, PA-C 05/17/20 Nyoka Lint, April, MD 05/17/20 2313

## 2020-05-17 NOTE — Discharge Instructions (Addendum)
You are found to have a small pneumonia on your CT scan today.  You have been prescribed an antibiotic called doxycycline.  Take antibiotics as directed. Please take all of your antibiotics until finished.  Additionally it is reassuring that you have no blood clot in your lungs or in your leg at this time  Follow up with your primary care doctor and pain management providers for the pain you have in your leg.  Use inhalers as directed.   Return to the Emergency Dept for any worsening trouble breathing, chest pain, vomiting.

## 2020-05-17 NOTE — Progress Notes (Signed)
RLE venous duplex has been completed. Preliminary results given to Silverio Decamp, PA.  Results can be found under chart review under CV PROC. 05/17/2020 10:26 AM Mickey Hebel RVT, RDMS

## 2020-05-17 NOTE — Progress Notes (Signed)
Orthopedic Tech Progress Note Patient Details:  [name] 02/08/1955 [id number]  Ortho Devices Type of Ortho Device: Knee Sleeve Ortho Device/Splint Location: right Ortho Device/Splint Interventions: Application   Post Interventions Patient Tolerated: Well Instructions Provided: Care of device   [name] 05/17/2020, 10:42 AM

## 2020-05-17 NOTE — ED Notes (Signed)
Ortho Tech at the bedside for placement of brace to right knee

## 2020-05-17 NOTE — ED Notes (Signed)
Ortho tech, Anderson Malta notified of the need for right knee brace/sleeve- she will place it in approx 10 minutes.

## 2020-05-17 NOTE — ED Triage Notes (Signed)
Patient arrives complaining of shortness of breath and leg swelling. Patient states leg swelling x3 months, and shortness of breath for the past few days. Patient noted to be labored, difficulty speaking in full sentences with auditory crackles.

## 2020-05-17 NOTE — ED Notes (Signed)
Patient was repositioned in the bed.

## 2020-05-26 ENCOUNTER — Telehealth: Payer: Self-pay

## 2020-05-26 DIAGNOSIS — M25512 Pain in left shoulder: Secondary | ICD-10-CM

## 2020-05-26 NOTE — Telephone Encounter (Signed)
Please advise 

## 2020-05-26 NOTE — Telephone Encounter (Signed)
Pt was called and informed and stated understanding. She will call us back once she is done with MRI

## 2020-05-26 NOTE — Addendum Note (Signed)
Addended by: Robyne Peers on: 05/26/2020 01:32 PM   Modules accepted: Orders

## 2020-05-26 NOTE — Telephone Encounter (Signed)
Patient would like to know if she can get an MRI of left shoulder with contrast? Would like this done through [facility]. CB# [phone].  Please advise.  Thank you.

## 2020-07-01 ENCOUNTER — Ambulatory Visit
Admission: RE | Admit: 2020-07-01 | Discharge: 2020-07-01 | Disposition: A | Discharge status: Home / Self Care | Payer: Medicare Other | Source: Ambulatory Visit | Attending: Orthopaedic Surgery | Admitting: Orthopaedic Surgery

## 2020-07-01 DIAGNOSIS — M25512 Pain in left shoulder: Secondary | ICD-10-CM

## 2020-07-05 ENCOUNTER — Ambulatory Visit (INDEPENDENT_AMBULATORY_CARE_PROVIDER_SITE_OTHER): Payer: Medicare Other | Admitting: Orthopaedic Surgery

## 2020-07-05 ENCOUNTER — Encounter: Payer: Self-pay | Admitting: Orthopaedic Surgery

## 2020-07-05 DIAGNOSIS — M75122 Complete rotator cuff tear or rupture of left shoulder, not specified as traumatic: Secondary | ICD-10-CM | POA: Diagnosis not present

## 2020-07-05 DIAGNOSIS — G8929 Other chronic pain: Secondary | ICD-10-CM | POA: Diagnosis not present

## 2020-07-05 DIAGNOSIS — M25561 Pain in right knee: Secondary | ICD-10-CM | POA: Diagnosis not present

## 2020-07-05 DIAGNOSIS — M25512 Pain in left shoulder: Secondary | ICD-10-CM | POA: Diagnosis not present

## 2020-07-05 MED ORDER — METHYLPREDNISOLONE ACETATE 40 MG/ML IJ SUSP
40.0000 mg | INTRAMUSCULAR | Status: AC | PRN
  Administered 2020-07-05: 40 mg via INTRA_ARTICULAR

## 2020-07-05 MED ORDER — LIDOCAINE HCL 1 % IJ SOLN
3.0000 mL | INTRAMUSCULAR | Status: AC | PRN
  Administered 2020-07-05: 3 mL

## 2020-07-05 NOTE — Progress Notes (Signed)
Office Visit Note   Patient: [name]           Date of Birth: 12-01-1955           MRN: [id number] Visit Date: 07/05/2020              Requested by: Bernerd Limbo, MD [address] [place],  [place] [id number] PCP: Bernerd Limbo, MD   Assessment & Plan: Visit Diagnoses:  1. Chronic pain of right knee   2. Chronic left shoulder pain   3. Complete tear of left rotator cuff, unspecified whether traumatic     Plan: I shared with the patient the findings of her left shoulder on the MRI showing a full-thickness retracted rotator cuff tear that is showing muscle atrophy as well.  This is not his shoulder that I would recommend a rotator cuff repair based on what we are seeing on the MRI.  Per her request I did provide a steroid injection of the left shoulder subacromial space and the right knee.  She would like to consider hyaluronic acid again for her right knee when we can do that again which I believe is in September.  With that being said I would like to see her for a follow-up appointment in 2 months from now to go over things again and see how her left shoulder and right knee are doing.  I would like a weight and BMI calculation at that visit as well.  No x-rays will be needed.  Follow-Up Instructions: Return in about 2 months (around 09/04/2020).   Orders:  Orders Placed This Encounter  Procedures   Large Joint Inj: L subacromial bursa   Large Joint Inj: R knee   No orders of the defined types were placed in this encounter.     Procedures: Large Joint Inj: L subacromial bursa on 07/05/2020 4:04 PM Indications: pain and diagnostic evaluation Details: 22 G 1.5 in needle  Arthrogram: No  Medications: 3 mL lidocaine 1 %; 40 mg methylPREDNISolone acetate 40 MG/ML Outcome: tolerated well, no immediate complications Procedure, treatment alternatives, risks and benefits explained, specific risks discussed. Consent was given by the patient. Immediately prior to  procedure a time out was called to verify the correct patient, procedure, equipment, support staff and site/side marked as required. Patient was prepped and draped in the usual sterile fashion.    Large Joint Inj: R knee on 07/05/2020 4:04 PM Indications: diagnostic evaluation and pain Details: 22 G 1.5 in needle, superolateral approach  Arthrogram: No  Medications: 3 mL lidocaine 1 %; 40 mg methylPREDNISolone acetate 40 MG/ML Outcome: tolerated well, no immediate complications Procedure, treatment alternatives, risks and benefits explained, specific risks discussed. Consent was given by the patient. Immediately prior to procedure a time out was called to verify the correct patient, procedure, equipment, support staff and site/side marked as required. Patient was prepped and draped in the usual sterile fashion.      Clinical Data: No additional findings.   Subjective: Chief Complaint  Patient presents with   Left Shoulder - Pain  The patient is finally seen before with known arthritis in both of her knees.  She is also been having chronic shoulder issues with her left shoulder and at the very conservative treatment it was warranted to have a MRI of the left shoulder.  She is able to reach overhead and behind her but has been weak and painful for a long period of time.  We have injected  both knees before with hyaluronic acid.  The right knee is bothering her more and she would like to have a steroid injection and release her right knee and her left shoulder today.  She is also here to go over MRI of the left shoulder.  HPI  Review of Systems There is currently listed no headache, chest pain, shortness of breath, fever, chills, nausea, vomiting she is alert and orient x3 and in no acute distress  Objective: Vital Signs: There were no vitals taken for this visit.  Physical Exam  Ortho Exam Examination of her left shoulder shows she is able to lift overhead and behind her and there is  some weakness in the rotator cuff.  Neither knee has an effusion today but the right knee is certainly painful throughout its arc of motion. Specialty Comments:  No specialty comments available.  Imaging: No results found.  The MRI of her left shoulder is reviewed and does show a full-thickness and retracted rotator cuff tear all the way back to the level of the glenoid.  There is significant muscle atrophy as well.  There is some moderate thinning of the cartilage of the glenohumeral joint but the humeral head is not high riding. PMFS History: Patient Active Problem List   Diagnosis Date Noted   Severe sepsis with acute organ dysfunction ([place]) 01/04/2020   Primary osteoarthritis of right knee 12/04/2019   Primary osteoarthritis of left knee 12/04/2019   Vitamin D deficiency 09/20/2019   Cellulitis of foot 09/18/2019   Aspiration pneumonia of both lower lobes due to gastric secretions ([place]) 06/02/2019   Lumbar disc herniation 05/04/2019   Encounter for colorectal cancer screening 04/19/2019   Acute respiratory distress 04/06/2019   Hyperglycemia 04/06/2019   Pleural effusion on right 04/06/2019   Benzodiazepine dependence ([place]) 02/16/2019   Cervical stenosis of spinal canal 02/15/2019   Streptococcal sepsis ([place]) 01/22/2019   Acute bronchitis due to Rhinovirus 11/06/2018   Lung collapse 11/01/2018   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 08/13/2018   Compression fracture of body of thoracic vertebra (HCC) 08/01/2018   Closed nondisplaced fracture of greater tuberosity of right humerus 07/08/2018   On rivaroxaban therapy 07/08/2018   Chronic anticoagulation 05/27/2018   Falls frequently 05/27/2018   History of DVT (deep vein thrombosis) 03/16/2018   History of pulmonary embolism 03/10/2018   Left lower quadrant abdominal pain 03/10/2018   Chronic pain syndrome 03/05/2018   COPD exacerbation ([place]) 06/09/2016   Abnormal urine odor 09/26/2015    Recurrent pulmonary embolism ([place]) 06/26/2015   Lupus anticoagulant positive 06/26/2015   Thrombocytopenia ([place]) 05/31/2015   UTI (lower urinary tract infection) 05/26/2015   Right flank pain    Patchy loss of hair 03/08/2015   Primary osteoarthritis of both knees 02/25/2015   Incarcerated incisional hernia s/p lap repair w mesh 12/10/2014 12/10/2014   DVT (deep venous thrombosis) ([place]) 10/31/2013   Pulmonary embolism ([place]) 10/26/2013   Kidney stone 10/04/2013   Tobacco abuse 10/04/2013   COPD (chronic obstructive pulmonary disease) ([place])    Unspecified constipation 12/23/2012   CKD (chronic kidney disease), stage III ([place]) 12/21/2012   Allergic rhinitis 06/29/2012   Chronic low back pain 06/29/2012   Dysuria 06/29/2012   Edema 06/29/2012   Gastro-esophageal reflux disease without esophagitis 06/29/2012   Hemorrhoids 06/29/2012   Hirsutism 06/29/2012   Insomnia 06/29/2012   Localized superficial swelling, mass, or lump 06/29/2012   Urinary incontinence 06/29/2012   Skin sensation disturbance 06/29/2012  Shortness of breath 06/29/2012   Pulmonary embolism and infarction ([place]) 06/29/2012   Proteinuria 06/29/2012   Nondependent cannabis abuse 06/29/2012   Acquired cyst of kidney 10/24/2011   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   Cocaine abuse ([place]) 11/27/2010   Depressive disorder 09/23/2009   Ascites 09/23/2009   Obesity 09/01/2008   Hepatitis B virus infection 08/31/2008   Acute hepatitis C virus infection 08/31/2008   Iron deficiency anemia 08/31/2008   Anxiety state 08/31/2008   Essential hypertension 08/31/2008   Coronary atherosclerosis 08/31/2008   Hepatic cirrhosis ([place]) 08/31/2008   Past Medical History:  Diagnosis Date   Agoraphobia with panic attacks    Altered mental state 10/26/2013   Ankle fracture, left 11/27/2010   S/p ORIF 11/2    Anxiety    Arthritis    "knees; lower back" (10/30/2013)   Bipolar disorder ([place])    Cavitary pneumonia    Cirrhosis  ([place])    CKD (chronic kidney disease), stage III ([place])    COPD (chronic obstructive pulmonary disease) ([place])    Depression    DVT (deep venous thrombosis) ([place]) 09/2013   RLE   GERD (gastroesophageal reflux disease)    Heart murmur    Hepatitis B    History of blood transfusion    "due to excessive blood loss before hysterectomy"   History of urinary tract infection    Hypertension    currently on no medication    Kidney stones    Pneumonia    "4 times in the past year" (10/30/2013)   Pulmonary emboli ([place]) 09/2013   Shortness of breath dyspnea    increased exertion;exercise   Urinary frequency    Urinary incontinence     Family History  Problem Relation Age of Onset   Stroke Mother    Lung cancer Father     Past Surgical History:  Procedure Laterality Date   ABDOMINAL HYSTERECTOMY     ANKLE FRACTURE SURGERY Left    CARPAL TUNNEL RELEASE Left    DILATION AND CURETTAGE OF UTERUS  X 2   FRACTURE SURGERY     HAND SURGERY     1980's/left hand    INSERTION OF MESH N/A 12/10/2014   Procedure: INSERTION OF MESH;  Surgeon: Michael Boston, MD;  Location: [facility];  Service: General;  Laterality: N/A;   LAPAROSCOPIC ASSISTED VENTRAL HERNIA REPAIR N/A 12/10/2014   Procedure: LAPAROSCOPIC ASSISTED REPAIR OF INCARCERATED UMBILICAL HERNIA ;  Surgeon: Michael Boston, MD;  Location: [facility];  Service: General;  Laterality: N/A;   TUBAL LIGATION     Social History   Occupational History   Not on file  Tobacco Use   Smoking status: Former    Packs/day: 0.50    Years: 35.00    Pack years: 17.50    Types: Cigarettes    Quit date: 10/26/2013    Years since quitting: 6.6   Smokeless tobacco: Never  Vaping Use   Vaping Use: Some days   Substances: Nicotine, Flavoring  Substance and Sexual Activity   Alcohol use: No    Alcohol/week: 0.0 standard drinks    Comment: "stopped drinking  in 2004"   Drug use: No    Types: "Crack" cocaine, Marijuana    Comment: 10/30/2013 "last crack in 2014;  last marijuana in ~ 1990" drug free for last 3 years    Sexual activity: Never

## 2020-07-17 ENCOUNTER — Other Ambulatory Visit: Payer: Self-pay

## 2020-07-17 ENCOUNTER — Encounter (HOSPITAL_COMMUNITY): Payer: Self-pay | Admitting: Emergency Medicine

## 2020-07-17 ENCOUNTER — Emergency Department (HOSPITAL_COMMUNITY)
Admission: EM | Admit: 2020-07-17 | Discharge: 2020-07-18 | Disposition: A | Discharge status: Home / Self Care | Payer: Medicare Other | Attending: Emergency Medicine | Admitting: Emergency Medicine

## 2020-07-17 DIAGNOSIS — R300 Dysuria: Secondary | ICD-10-CM | POA: Diagnosis not present

## 2020-07-17 DIAGNOSIS — I129 Hypertensive chronic kidney disease with stage 1 through stage 4 chronic kidney disease, or unspecified chronic kidney disease: Secondary | ICD-10-CM | POA: Insufficient documentation

## 2020-07-17 DIAGNOSIS — Z79899 Other long term (current) drug therapy: Secondary | ICD-10-CM | POA: Diagnosis not present

## 2020-07-17 DIAGNOSIS — R0602 Shortness of breath: Secondary | ICD-10-CM | POA: Diagnosis not present

## 2020-07-17 DIAGNOSIS — R112 Nausea with vomiting, unspecified: Secondary | ICD-10-CM | POA: Diagnosis not present

## 2020-07-17 DIAGNOSIS — R1084 Generalized abdominal pain: Secondary | ICD-10-CM | POA: Diagnosis not present

## 2020-07-17 DIAGNOSIS — J441 Chronic obstructive pulmonary disease with (acute) exacerbation: Secondary | ICD-10-CM | POA: Diagnosis not present

## 2020-07-17 DIAGNOSIS — N183 Chronic kidney disease, stage 3 unspecified: Secondary | ICD-10-CM | POA: Insufficient documentation

## 2020-07-17 DIAGNOSIS — Z87891 Personal history of nicotine dependence: Secondary | ICD-10-CM | POA: Insufficient documentation

## 2020-07-17 DIAGNOSIS — R5383 Other fatigue: Secondary | ICD-10-CM | POA: Diagnosis not present

## 2020-07-17 DIAGNOSIS — Z7901 Long term (current) use of anticoagulants: Secondary | ICD-10-CM | POA: Diagnosis not present

## 2020-07-17 DIAGNOSIS — R197 Diarrhea, unspecified: Secondary | ICD-10-CM | POA: Diagnosis not present

## 2020-07-17 DIAGNOSIS — R509 Fever, unspecified: Secondary | ICD-10-CM | POA: Diagnosis not present

## 2020-07-17 LAB — URINALYSIS, ROUTINE W REFLEX MICROSCOPIC
Bacteria, UA: NONE SEEN
Bilirubin Urine: NEGATIVE
Glucose, UA: NEGATIVE mg/dL
Ketones, ur: NEGATIVE mg/dL
Leukocytes,Ua: NEGATIVE
Nitrite: NEGATIVE
Protein, ur: 30 mg/dL — AB
Specific Gravity, Urine: 1.003 — ABNORMAL LOW (ref 1.005–1.030)
pH: 8 (ref 5.0–8.0)

## 2020-07-17 LAB — COMPREHENSIVE METABOLIC PANEL
ALT: 40 U/L (ref 0–44)
AST: 40 U/L (ref 15–41)
Albumin: 4.1 g/dL (ref 3.5–5.0)
Alkaline Phosphatase: 106 U/L (ref 38–126)
Anion gap: 15 (ref 5–15)
BUN: 13 mg/dL (ref 8–23)
CO2: 21 mmol/L — ABNORMAL LOW (ref 22–32)
Calcium: 10.2 mg/dL (ref 8.9–10.3)
Chloride: 101 mmol/L (ref 98–111)
Creatinine, Ser: 1.2 mg/dL — ABNORMAL HIGH (ref 0.44–1.00)
GFR, Estimated: 51 mL/min — ABNORMAL LOW (ref >60)
Glucose, Bld: 90 mg/dL (ref 70–99)
Potassium: 4.2 mmol/L (ref 3.5–5.1)
Sodium: 137 mmol/L (ref 135–145)
Total Bilirubin: 0.7 mg/dL (ref 0.3–1.2)
Total Protein: 7.5 g/dL (ref 6.5–8.1)

## 2020-07-17 LAB — CBC
HCT: 42.8 % (ref 36.0–46.0)
Hemoglobin: 13.6 g/dL (ref 12.0–15.0)
MCH: 26.4 pg (ref 26.0–34.0)
MCHC: 31.8 g/dL (ref 30.0–36.0)
MCV: 82.9 fL (ref 80.0–100.0)
Platelets: 153 10*3/uL (ref 150–400)
RBC: 5.16 MIL/uL — ABNORMAL HIGH (ref 3.87–5.11)
RDW: 17.6 % — ABNORMAL HIGH (ref 11.5–15.5)
WBC: 8.1 10*3/uL (ref 4.0–10.5)
nRBC: 0 % (ref 0.0–0.2)

## 2020-07-17 LAB — LIPASE, BLOOD: Lipase: 52 U/L — ABNORMAL HIGH (ref 11–51)

## 2020-07-17 NOTE — ED Triage Notes (Signed)
Pt reports generalized abd pain x 3 days that radiates to back with nausea, vomiting, and diarrhea.

## 2020-07-18 ENCOUNTER — Emergency Department (HOSPITAL_COMMUNITY): Payer: Medicare Other

## 2020-07-18 DIAGNOSIS — R1084 Generalized abdominal pain: Secondary | ICD-10-CM | POA: Diagnosis not present

## 2020-07-18 MED ORDER — ONDANSETRON 4 MG PO TBDP: 4.0000 mg | ORAL_TABLET | Freq: Three times a day (TID) | ORAL | 0 refills | Status: DC | PRN

## 2020-07-18 MED ORDER — SODIUM CHLORIDE 0.9 % IV SOLN
12.5000 mg | Freq: Once | INTRAVENOUS | Status: AC
  Administered 2020-07-18: 12.5 mg via INTRAVENOUS
  Filled 2020-07-18: qty 0.5

## 2020-07-18 MED ORDER — IOHEXOL 300 MG/ML  SOLN
100.0000 mL | Freq: Once | INTRAMUSCULAR | Status: AC | PRN
  Administered 2020-07-18: 100 mL via INTRAVENOUS

## 2020-07-18 MED ORDER — LOPERAMIDE HCL 2 MG PO CAPS: 2.0000 mg | ORAL_CAPSULE | Freq: Four times a day (QID) | ORAL | 0 refills | Status: DC | PRN

## 2020-07-18 MED ORDER — LORAZEPAM 2 MG/ML IJ SOLN
1.0000 mg | Freq: Once | INTRAMUSCULAR | Status: AC
  Administered 2020-07-18: 1 mg via INTRAVENOUS
  Filled 2020-07-18: qty 1

## 2020-07-18 MED ORDER — FENTANYL CITRATE (PF) 100 MCG/2ML IJ SOLN
100.0000 ug | Freq: Once | INTRAMUSCULAR | Status: AC
  Administered 2020-07-18: 100 ug via INTRAVENOUS
  Filled 2020-07-18: qty 2

## 2020-07-18 MED ORDER — ONDANSETRON HCL 4 MG/2ML IJ SOLN
4.0000 mg | Freq: Once | INTRAMUSCULAR | Status: AC
  Administered 2020-07-18: 4 mg via INTRAVENOUS
  Filled 2020-07-18: qty 2

## 2020-07-18 MED ORDER — LOPERAMIDE HCL 2 MG PO CAPS
2.0000 mg | ORAL_CAPSULE | Freq: Once | ORAL | Status: AC
  Administered 2020-07-18: 2 mg via ORAL
  Filled 2020-07-18: qty 1

## 2020-07-18 MED ORDER — LACTATED RINGERS IV BOLUS
1000.0000 mL | Freq: Once | INTRAVENOUS | Status: AC
  Administered 2020-07-18: 1000 mL via INTRAVENOUS

## 2020-07-18 MED ORDER — DICYCLOMINE HCL 20 MG PO TABS: 20.0000 mg | ORAL_TABLET | Freq: Two times a day (BID) | ORAL | 0 refills | Status: DC

## 2020-07-18 MED ORDER — DICYCLOMINE HCL 10 MG PO CAPS
10.0000 mg | ORAL_CAPSULE | Freq: Once | ORAL | Status: AC
  Administered 2020-07-18: 10 mg via ORAL
  Filled 2020-07-18: qty 1

## 2020-07-18 NOTE — ED Provider Notes (Signed)
  Provider Note MRN:  [id number]  Arrival date & time: 07/18/20    ED Course and Medical Decision Making  Assumed care from Dr. Christy Gentles at shift change.  Persistent nausea vomiting, diarrhea, now with worsening abdominal pain, awaiting CT imaging.  Candidate for discharge if imaging reassuring.  CT is normal.  Patient informed of her renal nodule.  Tolerating p.o., appropriate for discharge.  Procedures  Final Clinical Impressions(s) / ED Diagnoses     ICD-10-CM   1. Diarrhea, unspecified type  R19.7       ED Discharge Orders          Ordered    dicyclomine (BENTYL) 20 MG tablet  2 times daily        07/18/20 1022    ondansetron (ZOFRAN ODT) 4 MG disintegrating tablet  Every 8 hours PRN        07/18/20 1022    loperamide (IMODIUM) 2 MG capsule  4 times daily PRN        07/18/20 1022              Discharge Instructions      You were evaluated in the Emergency Department and after careful evaluation, we did not find any emergent condition requiring admission or further testing in the hospital.  Your exam/testing today was overall reassuring.  Symptoms likely due to a virus causing nausea and diarrhea.  Use the Imodium as needed for diarrhea.  Use the Zofran as needed for nausea.  Use the Bentyl as needed for pain.  Drink plenty of fluids at home.  Your CT showed a small renal nodule that should be followed up with by your primary care doctor, who can schedule repeat imaging within the next year.  Please return to the Emergency Department if you experience any worsening of your condition.  Thank you for allowing [place] to be a part of your care.       Barth Kirks. [place] Small, [place] [email or web address]    Maudie Flakes, MD 07/18/20 1022

## 2020-07-18 NOTE — Discharge Instructions (Addendum)
You were evaluated in the Emergency Department and after careful evaluation, we did not find any emergent condition requiring admission or further testing in the hospital.  Your exam/testing today was overall reassuring.  Symptoms likely due to a virus causing nausea and diarrhea.  Use the Imodium as needed for diarrhea.  Use the Zofran as needed for nausea.  Use the Bentyl as needed for pain.  Drink plenty of fluids at home.  Your CT showed a small renal nodule that should be followed up with by your primary care doctor, who can schedule repeat imaging within the next year.  Please return to the Emergency Department if you experience any worsening of your condition.  Thank you for allowing [place] to be a part of your care.

## 2020-07-18 NOTE — ED Provider Notes (Signed)
[facility] EMERGENCY DEPARTMENT Provider Note   CSN: [id number] Arrival date & time: 07/17/20  1521     History Chief Complaint  Patient presents with   Abdominal Pain    [name] is a 65 y.o. female.   Abdominal Pain Pain location:  Generalized Pain quality: cramping   Pain radiates to:  Does not radiate Pain severity:  Moderate Onset quality:  Gradual Duration:  3 days Timing:  Intermittent Progression:  Unchanged Chronicity:  New Relieved by:  Nothing Worsened by:  Nothing Associated symptoms: chills, diarrhea, dysuria, fatigue, fever, nausea, shortness of breath and vomiting   Associated symptoms: no chest pain, no hematemesis and no hematochezia   Patient with extensive history including bipolar, COPD, VTE on anticoagulation presents with abdominal pain, vomiting and diarrhea.  She reports multiple episodes of both vomiting and diarrhea.  They are nonbloody Also reports abdominal pain and back pain.  She also reports having fevers and chills and weakness.  She also endorses dysuria.  She reports chronic shortness of breath from her COPD   Patient is requesting admission to the hospital and medications for her to "stop pooping " Past Medical History:  Diagnosis Date   Agoraphobia with panic attacks    Altered mental state 10/26/2013   Ankle fracture, left 11/27/2010   S/p ORIF 11/2    Anxiety    Arthritis    "knees; lower back" (10/30/2013)   Bipolar disorder ([place])    Cavitary pneumonia    Cirrhosis ([place])    CKD (chronic kidney disease), stage III ([place])    COPD (chronic obstructive pulmonary disease) ([place])    Depression    DVT (deep venous thrombosis) ([place]) 09/2013   RLE   GERD (gastroesophageal reflux disease)    Heart murmur    Hepatitis B    History of blood transfusion    "due to excessive blood loss before hysterectomy"   History of urinary tract infection    Hypertension    currently on no medication    Kidney stones     Pneumonia    "4 times in the past year" (10/30/2013)   Pulmonary emboli ([place]) 09/2013   Shortness of breath dyspnea    increased exertion;exercise   Urinary frequency    Urinary incontinence     Patient Active Problem List   Diagnosis Date Noted   Severe sepsis with acute organ dysfunction ([place]) 01/04/2020   Primary osteoarthritis of right knee 12/04/2019   Primary osteoarthritis of left knee 12/04/2019   Vitamin D deficiency 09/20/2019   Cellulitis of foot 09/18/2019   Aspiration pneumonia of both lower lobes due to gastric secretions ([place]) 06/02/2019   Lumbar disc herniation 05/04/2019   Encounter for colorectal cancer screening 04/19/2019   Acute respiratory distress 04/06/2019   Hyperglycemia 04/06/2019   Pleural effusion on right 04/06/2019   Benzodiazepine dependence ([place]) 02/16/2019   Cervical stenosis of spinal canal 02/15/2019   Streptococcal sepsis ([place]) 01/22/2019   Acute bronchitis due to Rhinovirus 11/06/2018   Lung collapse 11/01/2018   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 08/13/2018   Compression fracture of body of thoracic vertebra (HCC) 08/01/2018   Closed nondisplaced fracture of greater tuberosity of right humerus 07/08/2018   On rivaroxaban therapy 07/08/2018   Chronic anticoagulation 05/27/2018   Falls frequently 05/27/2018   History of DVT (deep vein thrombosis) 03/16/2018   History of pulmonary embolism 03/10/2018   Left lower quadrant abdominal pain 03/10/2018   Chronic  pain syndrome 03/05/2018   COPD exacerbation ([place]) 06/09/2016   Abnormal urine odor 09/26/2015   Recurrent pulmonary embolism ([place]) 06/26/2015   Lupus anticoagulant positive 06/26/2015   Thrombocytopenia ([place]) 05/31/2015   UTI (lower urinary tract infection) 05/26/2015   Right flank pain    Patchy loss of hair 03/08/2015   Primary osteoarthritis of both knees 02/25/2015   Incarcerated incisional hernia s/p lap repair w mesh 12/10/2014 12/10/2014    DVT (deep venous thrombosis) ([place]) 10/31/2013   Pulmonary embolism ([place]) 10/26/2013   Kidney stone 10/04/2013   Tobacco abuse 10/04/2013   COPD (chronic obstructive pulmonary disease) ([place])    Unspecified constipation 12/23/2012   CKD (chronic kidney disease), stage III ([place]) 12/21/2012   Allergic rhinitis 06/29/2012   Chronic low back pain 06/29/2012   Dysuria 06/29/2012   Edema 06/29/2012   Gastro-esophageal reflux disease without esophagitis 06/29/2012   Hemorrhoids 06/29/2012   Hirsutism 06/29/2012   Insomnia 06/29/2012   Localized superficial swelling, mass, or lump 06/29/2012   Urinary incontinence 06/29/2012   Skin sensation disturbance 06/29/2012   Shortness of breath 06/29/2012   Pulmonary embolism and infarction ([place]) 06/29/2012   Proteinuria 06/29/2012   Nondependent cannabis abuse 06/29/2012   Acquired cyst of kidney 10/24/2011   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   Cocaine abuse ([place]) 11/27/2010   Depressive disorder 09/23/2009   Ascites 09/23/2009   Obesity 09/01/2008   Hepatitis B virus infection 08/31/2008   Acute hepatitis C virus infection 08/31/2008   Iron deficiency anemia 08/31/2008   Anxiety state 08/31/2008   Essential hypertension 08/31/2008   Coronary atherosclerosis 08/31/2008   Hepatic cirrhosis ([place]) 08/31/2008    Past Surgical History:  Procedure Laterality Date   ABDOMINAL HYSTERECTOMY     ANKLE FRACTURE SURGERY Left    CARPAL TUNNEL RELEASE Left    DILATION AND CURETTAGE OF UTERUS  X 2   FRACTURE SURGERY     HAND SURGERY     1980's/left hand    INSERTION OF MESH N/A 12/10/2014   Procedure: INSERTION OF MESH;  Surgeon: Michael Boston, MD;  Location: [facility] ORS;  Service: General;  Laterality: N/A;   LAPAROSCOPIC ASSISTED VENTRAL HERNIA REPAIR N/A 12/10/2014   Procedure: LAPAROSCOPIC ASSISTED REPAIR OF INCARCERATED UMBILICAL HERNIA ;  Surgeon: Michael Boston, MD;  Location: [facility] ORS;  Service: General;  Laterality: N/A;   TUBAL LIGATION        OB History   No obstetric history on file.     Family History  Problem Relation Age of Onset   Stroke Mother    Lung cancer Father     Social History   Tobacco Use   Smoking status: Former    Packs/day: 0.50    Years: 35.00    Pack years: 17.50    Types: Cigarettes    Quit date: 10/26/2013    Years since quitting: 6.7   Smokeless tobacco: Never  Vaping Use   Vaping Use: Some days   Substances: Nicotine, Flavoring  Substance Use Topics   Alcohol use: No    Alcohol/week: 0.0 standard drinks    Comment: "stopped drinking  in 2004"   Drug use: No    Types: "Crack" cocaine, Marijuana    Comment: 10/30/2013 "last crack in 2014; last marijuana in ~ 1990" drug free for last 3 years     Home Medications Prior to Admission medications   Medication Sig Start Date End Date Taking? Authorizing Provider  albuterol (PROAIR HFA) 108 (90 Base) MCG/ACT inhaler INHALE  2 PUFFS INTO THE LUNGS EVERY 4 HOURS AS NEEDED FOR WHEEZING Patient taking differently: Inhale 2 puffs into the lungs every 4 (four) hours as needed for shortness of breath or wheezing. 12/04/18   Rigoberto Noel, MD  albuterol (PROVENTIL) (2.5 MG/3ML) 0.083% nebulizer solution Take 3 mLs (2.5 mg total) by nebulization every 4 (four) hours as needed for wheezing or shortness of breath. 12/04/18   Rigoberto Noel, MD  ALPRAZolam Duanne Moron) 1 MG tablet Take 0.5 tablets (0.5 mg total) by mouth 2 (two) times daily as needed for anxiety. Patient taking differently: Take 1 mg by mouth 3 (three) times daily. 06/02/15   Elgergawy, Silver Huguenin, MD  amLODipine (NORVASC) 5 MG tablet Take 5 mg by mouth daily. Patient not taking: No sig reported 12/21/19   [provider]  amoxicillin-clavulanate (AUGMENTIN) 875-125 MG tablet TAKE 1 TABLET BY MOUTH TWO TIMES DAILY FOR 2 DAYS. THROUGH 01/11/2020 Patient not taking: No sig reported 01/08/20 01/07/21  Charlynne Cousins, MD  baclofen (LIORESAL) 10 MG tablet Take 0.5-1 tablets (5-10 mg  total) by mouth 3 (three) times daily as needed for muscle spasms. Patient not taking: No sig reported 03/29/20   Hilts, Michael, MD  baclofen (LIORESAL) 20 MG tablet Take 20 mg by mouth at bedtime. 04/22/20   [provider]  budesonide-formoterol (SYMBICORT) 160-4.5 MCG/ACT inhaler Inhale 2 puffs into the lungs 2 (two) times daily. 12/04/18 12/04/19  Rigoberto Noel, MD  cephALEXin (KEFLEX) 500 MG capsule Take 1 capsule (500 mg total) by mouth 4 (four) times daily. Patient not taking: No sig reported 04/29/20   Lajean Saver, MD  Cholecalciferol (VITAMIN D3) 50 MCG (2000 UT) TABS Take 2,000 Units by mouth daily.    [provider]  clotrimazole-betamethasone (LOTRISONE) cream Apply to affected area 2 times daily prn Patient taking differently: Apply 1 application topically 2 (two) times daily as needed (rash). Apply to affected area 2 times daily prn 04/29/20   Lajean Saver, MD  docusate sodium (COLACE) 100 MG capsule Take 100 mg by mouth daily as needed for mild constipation.    [provider]  entecavir (BARACLUDE) 0.5 MG tablet Take 0.5 mg by mouth every other day. 11/25/19   [provider]  famotidine (PEPCID) 20 MG tablet Take 20 mg by mouth 2 (two) times daily. 10/28/19   [provider]  furosemide (LASIX) 20 MG tablet Take one tablet twice daily for 3 days then one tablet daily after that until gone. Patient not taking: No sig reported 11/01/19   Charlann Lange, PA-C  guaiFENesin (MUCINEX) 600 MG 12 hr tablet Take 1 tablet (600 mg total) by mouth 2 (two) times daily as needed. Patient not taking: No sig reported 12/31/19   Maudie Flakes, MD  levocetirizine (XYZAL) 5 MG tablet Take 5 mg by mouth every evening. 12/02/19   [provider]  magnesium hydroxide (MILK OF MAGNESIA) 400 MG/5ML suspension Take 30 mLs by mouth daily as needed for mild constipation.    [provider]  metoprolol tartrate (LOPRESSOR) 25 MG tablet Take 25 mg by  mouth 2 (two) times daily. 04/22/20   [provider]  montelukast (SINGULAIR) 10 MG tablet Take 10 mg by mouth at bedtime.    [provider]  Multiple Vitamins-Minerals (MULTIVITAMIN WOMEN 50+) TABS Take 1 tablet by mouth daily.    [provider]  naloxone (NARCAN) 4 MG/0.1ML LIQD nasal spray kit Place 4 mg into the nose once as needed (for  accidental overdose). 07/01/19   [provider]  ondansetron (ZOFRAN-ODT) 8 MG disintegrating tablet Take 8 mg by mouth every 8 (eight) hours as needed for nausea or vomiting (dissolve orally). 09/18/19   [provider]  Oxycodone HCl 10 MG TABS Take 10 mg by mouth See admin instructions. Take 10 mg by mouth every four to six hours as needed for pain 10/19/18   [provider]  oxyCODONE-acetaminophen (PERCOCET/ROXICET) 5-325 MG tablet Take 1 tablet by mouth every 4 (four) hours as needed for severe pain. Patient not taking: No sig reported 03/26/20   Hilts, Legrand Como, MD  pantoprazole (PROTONIX) 40 MG tablet Take 40 mg by mouth 2 (two) times daily before a meal. 12/23/13   [provider]  pregabalin (LYRICA) 150 MG capsule Take 150 mg by mouth 3 (three) times daily. 10/09/19   [provider]  QUEtiapine (SEROQUEL) 200 MG tablet Take 200 mg by mouth at bedtime. 05/06/20   [provider]  rivaroxaban (XARELTO) 20 MG TABS tablet Take 20 mg by mouth daily.    [provider]  spironolactone (ALDACTONE) 25 MG tablet Take 25 mg by mouth daily. 04/13/20   [provider]  sucralfate (CARAFATE) 1 g tablet Take 1 g by mouth 4 (four) times daily. 07/22/19   [provider]  SYMPROIC 0.2 MG TABS Take 0.2 mg by mouth daily. 04/30/20   [provider]  enoxaparin (LOVENOX) 120 MG/0.8ML SOLN Inject 0.8 mLs (120 mg total) into the skin every 12 (twelve) hours. 11/30/10 04/16/11  Jenelle Mages, MD  loratadine (CLARITIN) 10 MG tablet Take 10 mg by mouth daily.     04/16/11  [provider]    Allergies    Amitriptyline hcl, Azithromycin, Fentanyl, Gabapentin, Hydromorphone hcl, Cymbalta [duloxetine hcl], Dilaudid [hydromorphone], and Neomycin-bacitracin zn-polymyx  Review of Systems   Review of Systems  Constitutional:  Positive for chills, fatigue and fever.  Respiratory:  Positive for shortness of breath.   Cardiovascular:  Negative for chest pain.  Gastrointestinal:  Positive for abdominal pain, diarrhea, nausea and vomiting. Negative for blood in stool, hematemesis and hematochezia.  Genitourinary:  Positive for dysuria.  All other systems reviewed and are negative.  Physical Exam Updated Vital Signs BP (!) 171/111   Pulse 64   Temp 98.3 F (36.8 C) (Oral)   Resp 13   SpO2 99%   Physical Exam CONSTITUTIONAL: Well developed/well nourished HEAD: Normocephalic/atraumatic EYES: EOMI/PERRL, no icterus ENMT: Mucous membranes dry NECK: supple no meningeal signs SPINE/BACK:entire spine nontender CV: S1/S2 noted, no murmurs/rubs/gallops noted LUNGS: Lungs are clear to auscultation bilaterally, no apparent distress ABDOMEN: soft, nontender, no rebound or guarding, bowel sounds noted throughout abdomen, obese GU:no cva tenderness NEURO: Pt is awake/alert/appropriate, moves all extremitiesx4.  No facial droop.   EXTREMITIES: pulses normal/equal, full ROM SKIN: warm, color normal PSYCH: no abnormalities of mood noted, alert and oriented to situation  ED Results / Procedures / Treatments   Labs (all labs ordered are listed, but only abnormal results are displayed) Labs Reviewed  LIPASE, BLOOD - Abnormal; Notable for the following components:      Result Value   Lipase 52 (*)    All other components within normal limits  COMPREHENSIVE METABOLIC PANEL - Abnormal; Notable for the following components:   CO2 21 (*)    Creatinine, Ser 1.20 (*)    GFR, Estimated 51 (*)    All other components within normal limits  CBC -  Abnormal; Notable for the  following components:   RBC 5.16 (*)    RDW 17.6 (*)    All other components within normal limits  URINALYSIS, ROUTINE W REFLEX MICROSCOPIC - Abnormal; Notable for the following components:   Color, Urine STRAW (*)    Specific Gravity, Urine 1.003 (*)    Hgb urine dipstick SMALL (*)    Protein, ur 30 (*)    All other components within normal limits    EKG EKG Interpretation  Date/Time:  Sunday July 18 2020 02:01:34 EDT Ventricular Rate:  61 PR Interval:  151 QRS Duration: 103 QT Interval:  415 QTC Calculation: 418 R Axis:   -20 Text Interpretation: Sinus rhythm Abnormal R-wave progression, early transition LVH with secondary repolarization abnormality Baseline wander in lead(s) V4 V5 Confirmed by Ripley Fraise [phone]) on 07/18/2020 2:06:18 AM  Radiology No results found.  Procedures Procedures   Medications Ordered in ED Medications  lactated ringers bolus 1,000 mL (1,000 mLs Intravenous New Bag/Given 07/18/20 0212)  ondansetron (ZOFRAN) injection 4 mg (4 mg Intravenous Given 07/18/20 0213)  fentaNYL (SUBLIMAZE) injection 100 mcg (100 mcg Intravenous Given 07/18/20 0214)  loperamide (IMODIUM) capsule 2 mg (2 mg Oral Given 07/18/20 0216)    ED Course  I have reviewed the triage vital signs and the nursing notes.  Pertinent labs results that were available during my care of the patient were reviewed by me and considered in my medical decision making (see chart for details).    MDM Rules/Calculators/A&P                          2:41 AM Patient presents with vomiting and diarrhea for the past 3 days.  Denies any recent antibiotics or sick contacts. Overall patient is in no acute distress.  No focal abdominal tenderness.  We will treat symptoms, give IV fluids and reassess.  Will defer imaging at this time.  Patient has had 2 CT abdomen pelvis scans in the past 6 months that were both negative-1 of which was at an outside hospital 5:35 AM Patient  reports continued diarrhea.  She reports continued nausea despite multiple medications.  She reports continued abdominal pain.  On exam she has diffuse abdominal pain.  We will obtain CT abdomen pelvis then admit to the hospital due to intractable nausea 6:58 AM Signed out to Dr Sedonia Small at shift change to f/u on CT imaging and admit  Final Clinical Impression(s) / ED Diagnoses Final diagnoses:  None    Rx / DC Orders ED Discharge Orders     None        Ripley Fraise, MD 07/18/20 [phone]

## 2020-07-29 ENCOUNTER — Ambulatory Visit (HOSPITAL_COMMUNITY)
Admission: RE | Admit: 2020-07-29 | Discharge: 2020-07-29 | Disposition: A | Discharge status: Home / Self Care | Payer: Medicare Other | Source: Ambulatory Visit | Attending: Physician Assistant | Admitting: Physician Assistant

## 2020-07-29 ENCOUNTER — Other Ambulatory Visit: Payer: Self-pay

## 2020-07-29 ENCOUNTER — Ambulatory Visit
Admission: EM | Admit: 2020-07-29 | Discharge: 2020-07-29 | Disposition: A | Discharge status: Home / Self Care | Payer: Medicare Other | Attending: Physician Assistant | Admitting: Physician Assistant

## 2020-07-29 ENCOUNTER — Encounter: Payer: Self-pay | Admitting: Emergency Medicine

## 2020-07-29 DIAGNOSIS — M79604 Pain in right leg: Secondary | ICD-10-CM | POA: Diagnosis not present

## 2020-07-29 DIAGNOSIS — M7989 Other specified soft tissue disorders: Secondary | ICD-10-CM

## 2020-07-29 DIAGNOSIS — L03115 Cellulitis of right lower limb: Secondary | ICD-10-CM | POA: Diagnosis not present

## 2020-07-29 DIAGNOSIS — M79606 Pain in leg, unspecified: Secondary | ICD-10-CM | POA: Diagnosis not present

## 2020-07-29 DIAGNOSIS — S81801A Unspecified open wound, right lower leg, initial encounter: Secondary | ICD-10-CM

## 2020-07-29 MED ORDER — CEPHALEXIN 500 MG PO CAPS: 500.0000 mg | ORAL_CAPSULE | Freq: Three times a day (TID) | ORAL | 0 refills | Status: DC

## 2020-07-29 NOTE — Progress Notes (Signed)
Right lower extremity venous duplex completed. Refer to "CV Proc" under chart review to view preliminary results.  07/29/2020 3:00 PM Kelby Aline., MHA, RVT, RDCS, RDMS

## 2020-07-29 NOTE — ED Provider Notes (Signed)
[name]    CSN: [id number] Arrival date & time: 07/29/20  1000      History   Chief Complaint Chief Complaint  Patient presents with   Rash   Leg Swelling   Leg Pain    HPI [name] is a 65 y.o. female.   Patient presents today with a several month history of lower extremity edema that has worsened significantly in the past several days.  She reports pain is rated 7 on a 0-10 pain scale, localized to right lower leg, described as throbbing/tightness, no aggravating relieving factors identified.  She does have a history of DVT and reports that she is on Xarelto and believes she is taking this as prescribed without missing doses or noted side effects.  She denies any chest pain or shortness of breath.  She does have a history of chronic back pain and several previous spinal surgeries.  Reports back pain has worsened recently but she denies any worsening bowel/bladder incontinence, lower extremity weakness, saddle anesthesia.  She is prescribed chronic pain medication and has been taking this medicine as prescribed but provides no relief of lower extremity pain.  She denies any immobilization or hospitalization.  She has not seen a vascular surgeon in the past.  She was previously been prescribed diuretics which provided no relief of symptoms.  She is concerned that sudden worsening of symptoms prompting evaluation today.  Denies any fever, increased nausea, abdominal pain, dizziness, syncope.   Past Medical History:  Diagnosis Date   Agoraphobia with panic attacks    Altered mental state 10/26/2013   Ankle fracture, left 11/27/2010   S/p ORIF 11/2    Anxiety    Arthritis    "knees; lower back" (10/30/2013)   Bipolar disorder ([place])    Cavitary pneumonia    Cirrhosis ([place])    CKD (chronic kidney disease), stage III ([place])    COPD (chronic obstructive pulmonary disease) ([place])    Depression    DVT (deep venous thrombosis) ([place]) 09/2013   RLE   GERD (gastroesophageal  reflux disease)    Heart murmur    Hepatitis B    History of blood transfusion    "due to excessive blood loss before hysterectomy"   History of urinary tract infection    Hypertension    currently on no medication    Kidney stones    Pneumonia    "4 times in the past year" (10/30/2013)   Pulmonary emboli ([place]) 09/2013   Shortness of breath dyspnea    increased exertion;exercise   Urinary frequency    Urinary incontinence     Patient Active Problem List   Diagnosis Date Noted   Severe sepsis with acute organ dysfunction ([place]) 01/04/2020   Primary osteoarthritis of right knee 12/04/2019   Primary osteoarthritis of left knee 12/04/2019   Vitamin D deficiency 09/20/2019   Cellulitis of foot 09/18/2019   Aspiration pneumonia of both lower lobes due to gastric secretions ([place]) 06/02/2019   Lumbar disc herniation 05/04/2019   Encounter for colorectal cancer screening 04/19/2019   Acute respiratory distress 04/06/2019   Hyperglycemia 04/06/2019   Pleural effusion on right 04/06/2019   Benzodiazepine dependence ([place]) 02/16/2019   Cervical stenosis of spinal canal 02/15/2019   Streptococcal sepsis ([place]) 01/22/2019   Acute bronchitis due to Rhinovirus 11/06/2018   Lung collapse 11/01/2018   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 08/13/2018   Compression fracture of body of thoracic vertebra ([place]) 08/01/2018  Closed nondisplaced fracture of greater tuberosity of right humerus 07/08/2018   On rivaroxaban therapy 07/08/2018   Chronic anticoagulation 05/27/2018   Falls frequently 05/27/2018   History of DVT (deep vein thrombosis) 03/16/2018   History of pulmonary embolism 03/10/2018   Left lower quadrant abdominal pain 03/10/2018   Chronic pain syndrome 03/05/2018   COPD exacerbation ([place]) 06/09/2016   Abnormal urine odor 09/26/2015   Recurrent pulmonary embolism ([place]) 06/26/2015   Lupus anticoagulant positive 06/26/2015   Thrombocytopenia ([place])  05/31/2015   UTI (lower urinary tract infection) 05/26/2015   Right flank pain    Patchy loss of hair 03/08/2015   Primary osteoarthritis of both knees 02/25/2015   Incarcerated incisional hernia s/p lap repair w mesh 12/10/2014 12/10/2014   DVT (deep venous thrombosis) ([place]) 10/31/2013   Pulmonary embolism ([place]) 10/26/2013   Kidney stone 10/04/2013   Tobacco abuse 10/04/2013   COPD (chronic obstructive pulmonary disease) ([place])    Unspecified constipation 12/23/2012   CKD (chronic kidney disease), stage III ([place]) 12/21/2012   Allergic rhinitis 06/29/2012   Chronic low back pain 06/29/2012   Dysuria 06/29/2012   Edema 06/29/2012   Gastro-esophageal reflux disease without esophagitis 06/29/2012   Hemorrhoids 06/29/2012   Hirsutism 06/29/2012   Insomnia 06/29/2012   Localized superficial swelling, mass, or lump 06/29/2012   Urinary incontinence 06/29/2012   Skin sensation disturbance 06/29/2012   Shortness of breath 06/29/2012   Pulmonary embolism and infarction ([place]) 06/29/2012   Proteinuria 06/29/2012   Nondependent cannabis abuse 06/29/2012   Acquired cyst of kidney 10/24/2011   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   Cocaine abuse ([place]) 11/27/2010   Depressive disorder 09/23/2009   Ascites 09/23/2009   Obesity 09/01/2008   Hepatitis B virus infection 08/31/2008   Acute hepatitis C virus infection 08/31/2008   Iron deficiency anemia 08/31/2008   Anxiety state 08/31/2008   Essential hypertension 08/31/2008   Coronary atherosclerosis 08/31/2008   Hepatic cirrhosis ([place]) 08/31/2008    Past Surgical History:  Procedure Laterality Date   ABDOMINAL HYSTERECTOMY     ANKLE FRACTURE SURGERY Left    CARPAL TUNNEL RELEASE Left    DILATION AND CURETTAGE OF UTERUS  X 2   FRACTURE SURGERY     HAND SURGERY     1980's/left hand    INSERTION OF MESH N/A 12/10/2014   Procedure: INSERTION OF MESH;  Surgeon: Michael Boston, MD;  Location: [facility];  Service: General;  Laterality: N/A;    LAPAROSCOPIC ASSISTED VENTRAL HERNIA REPAIR N/A 12/10/2014   Procedure: LAPAROSCOPIC ASSISTED REPAIR OF INCARCERATED UMBILICAL HERNIA ;  Surgeon: Michael Boston, MD;  Location: [facility];  Service: General;  Laterality: N/A;   TUBAL LIGATION      OB History   No obstetric history on file.      Home Medications    Prior to Admission medications   Medication Sig Start Date End Date Taking? Authorizing Provider  cephALEXin (KEFLEX) 500 MG capsule Take 1 capsule (500 mg total) by mouth 3 (three) times daily. 07/29/20  Yes Brayton Baumgartner K, PA-C  albuterol (PROAIR HFA) 108 (90 Base) MCG/ACT inhaler INHALE 2 PUFFS INTO THE LUNGS EVERY 4 HOURS AS NEEDED FOR WHEEZING Patient taking differently: Inhale 2 puffs into the lungs every 4 (four) hours as needed for shortness of breath or wheezing. 12/04/18   Rigoberto Noel, MD  albuterol (PROVENTIL) (2.5 MG/3ML) 0.083% nebulizer solution Take 3 mLs (2.5 mg total) by nebulization every 4 (four) hours as needed for wheezing or shortness  of breath. 12/04/18   Rigoberto Noel, MD  ALPRAZolam Duanne Moron) 1 MG tablet Take 0.5 tablets (0.5 mg total) by mouth 2 (two) times daily as needed for anxiety. Patient taking differently: Take 1 mg by mouth 3 (three) times daily. 06/02/15   Elgergawy, Silver Huguenin, MD  baclofen (LIORESAL) 10 MG tablet Take 0.5-1 tablets (5-10 mg total) by mouth 3 (three) times daily as needed for muscle spasms. 03/29/20   Hilts, Legrand Como, MD  baclofen (LIORESAL) 20 MG tablet Take 20 mg by mouth at bedtime. 04/22/20   [provider]  budesonide-formoterol (SYMBICORT) 160-4.5 MCG/ACT inhaler Inhale 2 puffs into the lungs 2 (two) times daily. 12/04/18 07/18/20  Rigoberto Noel, MD  Cholecalciferol (VITAMIN D3) 50 MCG (2000 UT) TABS Take 2,000 Units by mouth daily.    [provider]  clotrimazole-betamethasone (LOTRISONE) cream Apply to affected area 2 times daily prn Patient taking differently: Apply 1 application topically 2 (two) times daily  as needed (rash). Apply to affected area 2 times daily prn 04/29/20   Lajean Saver, MD  dicyclomine (BENTYL) 20 MG tablet Take 1 tablet (20 mg total) by mouth 2 (two) times daily. 07/18/20   Maudie Flakes, MD  docusate sodium (COLACE) 100 MG capsule Take 100 mg by mouth daily.    [provider]  entecavir (BARACLUDE) 0.5 MG tablet Take 0.5 mg by mouth every other day. 11/25/19   [provider]  famotidine (PEPCID) 20 MG tablet Take 20 mg by mouth 2 (two) times daily. 10/28/19   [provider]  gabapentin (NEURONTIN) 300 MG capsule Take 300 mg by mouth at bedtime. 07/13/20   [provider]  levocetirizine (XYZAL) 5 MG tablet Take 5 mg by mouth every evening. 12/02/19   [provider]  loperamide (IMODIUM) 2 MG capsule Take 1 capsule (2 mg total) by mouth 4 (four) times daily as needed for diarrhea or loose stools. 07/18/20   Maudie Flakes, MD  magnesium hydroxide (MILK OF MAGNESIA) 400 MG/5ML suspension Take 30 mLs by mouth daily as needed for mild constipation.    [provider]  metoprolol tartrate (LOPRESSOR) 25 MG tablet Take 25 mg by mouth 2 (two) times daily. 04/22/20   [provider]  montelukast (SINGULAIR) 10 MG tablet Take 10 mg by mouth at bedtime.    [provider]  Multiple Vitamins-Minerals (MULTIVITAMIN WOMEN 50+) TABS Take 1 tablet by mouth daily.    [provider]  naloxone [facility]) 4 MG/0.1ML LIQD nasal spray kit Place 4 mg into the nose once as needed (for accidental overdose). 07/01/19   [provider]  ondansetron (ZOFRAN ODT) 4 MG disintegrating tablet Take 1 tablet (4 mg total) by mouth every 8 (eight) hours as needed for nausea or vomiting. 07/18/20   Maudie Flakes, MD  Oxycodone HCl 10 MG TABS Take 10 mg by mouth every 4 (four) hours as needed. Take 10 mg by mouth every four to six hours as needed for pain 10/19/18   [provider]  pantoprazole (PROTONIX) 40 MG tablet Take  40 mg by mouth 2 (two) times daily before a meal. 12/23/13   [provider]  QUEtiapine (SEROQUEL) 300 MG tablet Take 300 mg by mouth at bedtime. 05/06/20   [provider]  rivaroxaban (XARELTO) 20 MG TABS tablet Take 20 mg by mouth daily.    [provider]  spironolactone (ALDACTONE) 50 MG tablet Take 50 mg by mouth daily. 04/13/20   [provider]  sucralfate (CARAFATE) 1 g tablet Take 1 g by mouth 4 (four) times daily. 07/22/19   [provider]  SYMPROIC 0.2 MG TABS Take 0.2 mg by mouth daily. 04/30/20   [provider]  enoxaparin (LOVENOX) 120 MG/0.8ML SOLN Inject 0.8 mLs (120 mg total) into the skin every 12 (twelve) hours. 11/30/10 04/16/11  Jenelle Mages, MD  loratadine (CLARITIN) 10 MG tablet Take 10 mg by mouth daily.    04/16/11  [provider]    Family History Family History  Problem Relation Age of Onset   Stroke Mother    Lung cancer Father     Social History Social History   Tobacco Use   Smoking status: Former    Packs/day: 0.50    Years: 35.00    Pack years: 17.50    Types: Cigarettes    Quit date: 10/26/2013    Years since quitting: 6.7   Smokeless tobacco: Never  Vaping Use   Vaping Use: Some days   Substances: Nicotine, Flavoring  Substance Use Topics   Alcohol use: No    Alcohol/week: 0.0 standard drinks    Comment: "stopped drinking  in 2004"   Drug use: No    Types: "Crack" cocaine, Marijuana    Comment: 10/30/2013 "last crack in 2014; last marijuana in ~ 1990" drug free for last 3 years      Allergies   Amitriptyline hcl, Azithromycin, Fentanyl, Gabapentin, Hydromorphone hcl, Cymbalta [duloxetine hcl], Dilaudid [hydromorphone], and Neomycin-bacitracin zn-polymyx   Review of Systems Review of Systems  Constitutional:  Positive for activity change. Negative for appetite change, fatigue and fever.  Respiratory:  Negative for cough and shortness of breath.   Cardiovascular:   Positive for leg swelling. Negative for chest pain and palpitations.  Gastrointestinal:  Negative for abdominal pain, diarrhea, nausea and vomiting.  Musculoskeletal:  Positive for myalgias. Negative for arthralgias.  Skin:  Positive for rash and wound.  Neurological:  Negative for dizziness, light-headedness and headaches.    Physical Exam Triage Vital Signs ED Triage Vitals  Enc Vitals Group     BP 07/29/20 1058 (!) 153/91     Pulse Rate 07/29/20 1058 71     Resp 07/29/20 1058 (!) 22     Temp 07/29/20 1058 98.1 F (36.7 C)     Temp Source 07/29/20 1058 Oral     SpO2 07/29/20 1058 95 %     Weight --      Height --      Head Circumference --      Peak Flow --      Pain Score 07/29/20 1051 5     Pain Loc --      Pain Edu? --      Excl. in [place]? --    No data found.  Updated Vital Signs BP (!) 153/91 (BP Location: Left Arm)   Pulse 71   Temp 98.1 F (36.7 C) (Oral)   Resp (!) 22   SpO2 95%   Visual Acuity Right Eye Distance:   Left Eye Distance:   Bilateral Distance:    Right Eye Near:   Left Eye Near:    Bilateral Near:     Physical Exam Vitals reviewed.  Constitutional:      General: She is awake. She is not in acute distress.    Appearance: Normal appearance. She is normal weight. She is not ill-appearing.     Comments: Very pleasant female appears stated age in no acute  distress sitting comfortably in exam room  HENT:     Head: Normocephalic and atraumatic.  Cardiovascular:     Rate and Rhythm: Normal rate and regular rhythm.     Heart sounds: Normal heart sounds, S1 normal and S2 normal. No murmur heard.    Comments: 3+ pitting edema to knee right lower extremity.  Negative Homans' sign. Pulmonary:     Effort: Pulmonary effort is normal.     Breath sounds: Normal breath sounds. No wheezing, rhonchi or rales.     Comments: Clear to auscultation bilaterally Abdominal:     General: Bowel sounds are normal.     Palpations: Abdomen is soft.     Tenderness:  There is no abdominal tenderness. There is no right CVA tenderness, left CVA tenderness, guarding or rebound.  Musculoskeletal:     Right lower leg: Tenderness present. No bony tenderness. 3+ Edema present.     Left lower leg: No tenderness. No edema.  Feet:     Comments: Decree sensation of left foot patient reports this is chronic due to previous back surgeries. Skin:    Findings: Erythema and wound present.     Comments: 0.5 cm ulcerated lesion noted anterior tibia with surrounding erythema.  No streaking or evidence of lymphangitis.  No bleeding or drainage noted.  Psychiatric:        Behavior: Behavior is cooperative.     UC Treatments / Results  Labs (all labs ordered are listed, but only abnormal results are displayed) Labs Reviewed - No data to display  EKG   Radiology No results found.  Procedures Procedures (including critical care time)  Medications Ordered in UC Medications - No data to display  Initial Impression / Assessment and Plan / UC Course  I have reviewed the triage vital signs and the nursing notes.  Pertinent labs & imaging results that were available during my care of the patient were reviewed by me and considered in my medical decision making (see chart for details).      Will obtain ultrasound to rule out DVT.  More suspicious for cellulitis we will start patient on Keflex 3 times daily as she is safely taking this medication before.  Encouraged her to keep leg elevated and use compression stockings for additional symptom relief.  Discussed that if this is a chronic issue that has not responded to previous treatment will be worthwhile to see a vascular provider but this would need to be arranged through her primary care provider.  Discussed alarm symptoms that warrant emergent evaluation including chest pain and shortness of breath.  Strict return precautions given to which patient expressed understanding.  Final Clinical Impressions(s) / UC  Diagnoses   Final diagnoses:  Right leg swelling  Right leg pain  Wound of right lower extremity, initial encounter  Cellulitis of right lower extremity     Discharge Instructions      We are going to cover you for an infection by starting an antibiotic called Keflex (cephalexin).  Take this 3 times a day.  This would also cover for urinary tract infection.  We will contact you if your ultrasound is abnormal.  Continue Xarelto as previously prescribed.  If anything worsens or does not improve with the antibiotic you need to go to the emergency room as we discussed.  Please follow-up with your primary care provider to consider referral to vascular surgeon.     ED Prescriptions     Medication Sig Dispense Auth. Provider   cephALEXin (  KEFLEX) 500 MG capsule Take 1 capsule (500 mg total) by mouth 3 (three) times daily. 21 capsule Jakeline Dave K, PA-C      PDMP not reviewed this encounter.   Terrilee Croak, PA-C 07/29/20 1134

## 2020-07-29 NOTE — Discharge Instructions (Addendum)
We are going to cover you for an infection by starting an antibiotic called Keflex (cephalexin).  Take this 3 times a day.  This would also cover for urinary tract infection.  We will contact you if your ultrasound is abnormal.  Continue Xarelto as previously prescribed.  If anything worsens or does not improve with the antibiotic you need to go to the emergency room as we discussed.  Please follow-up with your primary care provider to consider referral to vascular surgeon.

## 2020-07-29 NOTE — ED Triage Notes (Signed)
Pt presents today with c/o of right lower leg pain/swelling x 4 months (denies injury). She also c/o of rash to left lower abdomen from using [place] to remove vaginal hair for upcoming beach trip. She c/o of pain to right lower back. She was seen in ER on 6/25 for back pain. She is unable to get in to see her PCP.

## 2020-08-09 ENCOUNTER — Telehealth: Payer: Self-pay | Admitting: Orthopaedic Surgery

## 2020-08-09 NOTE — Telephone Encounter (Signed)
Pt called asking for a CB in regards to having a letter written to submit to insurance. Pt states Dr.Blackman has been seeing her for her shoulder and while they were at the beach this weekend she hurt it even more. Pt is wanting the letter to petition getting a home health aid. Pt would like a CB to discuss further; sometime today if possible.   [phone]

## 2020-08-09 NOTE — Telephone Encounter (Signed)
Letter completed...lvm for pt to cb to discuss

## 2020-08-09 NOTE — Telephone Encounter (Signed)
Please advise 

## 2020-08-09 NOTE — Telephone Encounter (Signed)
Talked to pt. She also wanted a handicap form. This was added to envelope with letter and placed @ front desk

## 2020-08-17 ENCOUNTER — Other Ambulatory Visit: Payer: Self-pay

## 2020-08-17 ENCOUNTER — Ambulatory Visit (INDEPENDENT_AMBULATORY_CARE_PROVIDER_SITE_OTHER): Payer: Medicare Other | Admitting: Orthopaedic Surgery

## 2020-08-17 DIAGNOSIS — M25561 Pain in right knee: Secondary | ICD-10-CM

## 2020-08-17 DIAGNOSIS — M75122 Complete rotator cuff tear or rupture of left shoulder, not specified as traumatic: Secondary | ICD-10-CM | POA: Diagnosis not present

## 2020-08-17 DIAGNOSIS — M25512 Pain in left shoulder: Secondary | ICD-10-CM

## 2020-08-17 DIAGNOSIS — G8929 Other chronic pain: Secondary | ICD-10-CM

## 2020-08-17 MED ORDER — LIDOCAINE HCL 1 % IJ SOLN
3.0000 mL | INTRAMUSCULAR | Status: AC | PRN
  Administered 2020-08-17: 3 mL

## 2020-08-17 MED ORDER — METHYLPREDNISOLONE ACETATE 40 MG/ML IJ SUSP
40.0000 mg | INTRAMUSCULAR | Status: AC | PRN
  Administered 2020-08-17: 40 mg via INTRA_ARTICULAR

## 2020-08-17 NOTE — Progress Notes (Signed)
Office Visit Note   Patient: [name]           Date of Birth: February 19, 1955           MRN: [id number] Visit Date: 08/17/2020              Requested by: Bernerd Limbo, MD [address] [place],  [place] [id number] PCP: Bernerd Limbo, MD   Assessment & Plan: Visit Diagnoses:  1. Chronic left shoulder pain   2. Complete tear of left rotator cuff, unspecified whether traumatic   3. Chronic pain of right knee     Plan: I did aspirate at least 40 to 50 cc of fluid off of the right knee that was consistent with an inflammatory process.  I placed a steroid injection in that knee since she is not a diabetic.  I recommended at some point she see Dr. Sharol Given to evaluate her chronic cellulitis and her chronic right foot wound.  I can see her back myself in 4 weeks to see about potentially placing a steroid injection in her left shoulder.  Follow-Up Instructions: Return in about 4 weeks (around 09/14/2020).   Orders:  No orders of the defined types were placed in this encounter.  No orders of the defined types were placed in this encounter.     Procedures: Large Joint Inj: R knee on 08/17/2020 10:41 AM Indications: diagnostic evaluation and pain Details: 22 G 1.5 in needle, superolateral approach  Arthrogram: No  Medications: 3 mL lidocaine 1 %; 40 mg methylPREDNISolone acetate 40 MG/ML Outcome: tolerated well, no immediate complications Procedure, treatment alternatives, risks and benefits explained, specific risks discussed. Consent was given by the patient. Immediately prior to procedure a time out was called to verify the correct patient, procedure, equipment, support staff and site/side marked as required. Patient was prepped and draped in the usual sterile fashion.      Clinical Data: No additional findings.   Subjective: Chief Complaint  Patient presents with   Right Knee - Pain  The patient comes today with chief complaint of left shoulder pain and right  knee pain.  I have seen her for these things before.  She has a chronic retracted rotator cuff tear of the left shoulder and and chronic effusions with her left knee.  She is also been dealing with a chronic right foot wound and chronic cellulitis in the right lower extremity.  She has been at the beach recently had a mechanical fall causing bruising all around her left upper extremity.  She reports a recurrent effusion with her right knee.  HPI  Review of Systems There is currently listed no headache, chest pain, shortness of breath, fever, chills, nausea, vomiting  Objective: Vital Signs: There were no vitals taken for this visit.  Physical Exam She is alert and orient x3 and in no acute distress.  She mobilizes very slowly. Ortho Exam Examination of her left upper extremity shows chronic rotator cuff tearing of the left shoulder.  She has bruising throughout her left arm but no obvious fracture.  She is able to mobilize her shoulder and arm somewhat.  Her right knee does show a moderate effusion.  There is chronic cellulitis of the right lower extremity.  Her wound is wrapped up on her foot and has been treated by a podiatrist. Specialty Comments:  No specialty comments available.  Imaging: No results found.   PMFS History: Patient Active Problem List   Diagnosis Date Noted  Severe sepsis with acute organ dysfunction (HCC) 01/04/2020   Primary osteoarthritis of right knee 12/04/2019   Primary osteoarthritis of left knee 12/04/2019   Vitamin D deficiency 09/20/2019   Cellulitis of foot 09/18/2019   Aspiration pneumonia of both lower lobes due to gastric secretions (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 08/13/2018   Compression fracture of body of thoracic vertebra (HCC) 08/01/2018   Closed nondisplaced fracture of greater tuberosity of right humerus 07/08/2018   On rivaroxaban therapy 07/08/2018   Chronic anticoagulation 05/27/2018   Falls frequently 05/27/2018   History of DVT (deep vein thrombosis) 03/16/2018   History of pulmonary embolism 03/10/2018   Left lower quadrant abdominal pain 03/10/2018   Chronic pain syndrome 03/05/2018   COPD exacerbation ([place]) 06/09/2016   Abnormal urine odor 09/26/2015   Recurrent pulmonary embolism ([place]) 06/26/2015   Lupus anticoagulant positive 06/26/2015   Thrombocytopenia ([place]) 05/31/2015   UTI (lower urinary tract infection) 05/26/2015   Right flank pain    Patchy loss of hair 03/08/2015   Primary osteoarthritis of both knees 02/25/2015   Incarcerated incisional hernia s/p lap repair w mesh 12/10/2014 12/10/2014   DVT (deep venous thrombosis) ([place]) 10/31/2013   Pulmonary embolism ([place]) 10/26/2013   Kidney stone 10/04/2013   Tobacco abuse 10/04/2013   COPD (chronic obstructive pulmonary disease) ([place])    Unspecified constipation 12/23/2012   CKD (chronic kidney disease), stage III ([place]) 12/21/2012   Allergic rhinitis 06/29/2012   Chronic low back pain 06/29/2012   Dysuria 06/29/2012   Edema 06/29/2012   Gastro-esophageal reflux disease without esophagitis 06/29/2012   Hemorrhoids 06/29/2012   Hirsutism 06/29/2012   Insomnia 06/29/2012   Localized superficial swelling, mass, or lump 06/29/2012   Urinary incontinence 06/29/2012   Skin sensation disturbance 06/29/2012   Shortness of breath 06/29/2012   Pulmonary embolism and infarction ([place]) 06/29/2012   Proteinuria 06/29/2012   Nondependent cannabis abuse 06/29/2012   Acquired cyst of kidney 10/24/2011    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   Cocaine abuse ([place]) 11/27/2010   Depressive disorder 09/23/2009   Ascites 09/23/2009   Obesity 09/01/2008   Hepatitis B virus infection 08/31/2008   Acute hepatitis C virus infection 08/31/2008   Iron deficiency anemia 08/31/2008   Anxiety state 08/31/2008   Essential hypertension 08/31/2008   Coronary atherosclerosis 08/31/2008   Hepatic cirrhosis ([place]) 08/31/2008   Past Medical History:  Diagnosis Date   Agoraphobia with panic attacks    Altered mental state 10/26/2013   Ankle fracture, left 11/27/2010   S/p ORIF 11/2    Anxiety    Arthritis    "knees; lower back" (10/30/2013)   Bipolar disorder ([place])    Cavitary pneumonia    Cirrhosis ([place])    CKD (chronic kidney disease), stage III ([place])    COPD (chronic obstructive pulmonary disease) ([place])    Depression    DVT (deep venous thrombosis) ([place]) 09/2013   RLE   GERD (gastroesophageal reflux disease)    Heart murmur    Hepatitis B    History of blood transfusion    "due to  excessive blood loss before hysterectomy"   History of urinary tract infection    Hypertension    currently on no medication    Kidney stones    Pneumonia    "4 times in the past year" (10/30/2013)   Pulmonary emboli (HCC) 09/2013   Shortness of breath dyspnea    increased exertion;exercise   Urinary frequency    Urinary incontinence     Family History  Problem Relation Age of Onset   Stroke Mother    Lung cancer Father     Past Surgical History:  Procedure Laterality Date   ABDOMINAL HYSTERECTOMY     ANKLE FRACTURE SURGERY Left    CARPAL TUNNEL RELEASE Left    DILATION AND CURETTAGE OF UTERUS  X 2   FRACTURE SURGERY     HAND SURGERY     1980's/left hand    INSERTION OF MESH N/A 12/10/2014   Procedure: INSERTION OF MESH;  Surgeon: Michael Boston, MD;  Location: [facility];  Service: General;  Laterality: N/A;   LAPAROSCOPIC ASSISTED VENTRAL HERNIA REPAIR N/A 12/10/2014   Procedure: LAPAROSCOPIC  ASSISTED REPAIR OF INCARCERATED UMBILICAL HERNIA ;  Surgeon: Michael Boston, MD;  Location: [facility];  Service: General;  Laterality: N/A;   TUBAL LIGATION     Social History   Occupational History   Not on file  Tobacco Use   Smoking status: Former    Packs/day: 0.50    Years: 35.00    Pack years: 17.50    Types: Cigarettes    Quit date: 10/26/2013    Years since quitting: 6.8   Smokeless tobacco: Never  Vaping Use   Vaping Use: Some days   Substances: Nicotine, Flavoring  Substance and Sexual Activity   Alcohol use: No    Alcohol/week: 0.0 standard drinks    Comment: "stopped drinking  in 2004"   Drug use: No    Types: "Crack" cocaine, Marijuana    Comment: 10/30/2013 "last crack in 2014; last marijuana in ~ 1990" drug free for last 3 years    Sexual activity: Never

## 2020-08-24 ENCOUNTER — Encounter: Payer: Self-pay | Admitting: Orthopedic Surgery

## 2020-08-24 ENCOUNTER — Ambulatory Visit (INDEPENDENT_AMBULATORY_CARE_PROVIDER_SITE_OTHER): Payer: Medicare Other | Admitting: Orthopedic Surgery

## 2020-08-24 ENCOUNTER — Other Ambulatory Visit: Payer: Self-pay

## 2020-08-24 ENCOUNTER — Ambulatory Visit: Payer: Self-pay

## 2020-08-24 DIAGNOSIS — M79671 Pain in right foot: Secondary | ICD-10-CM | POA: Diagnosis not present

## 2020-08-24 DIAGNOSIS — M6701 Short Achilles tendon (acquired), right ankle: Secondary | ICD-10-CM | POA: Diagnosis not present

## 2020-08-24 NOTE — Progress Notes (Signed)
Office Visit Note   Patient: [name]           Date of Birth: May 11, 1955           MRN: [id number] Visit Date: 08/24/2020              Requested by: Bernerd Limbo, MD [address] [place],  [place] [id number] PCP: Bernerd Limbo, MD  Chief Complaint  Patient presents with   Right Foot - Pain      HPI: Patient is a 65 year old woman who is seen for evaluation for right foot pain.  She complains of a painful callus beneath the great toe MTP joint.  She has been soaking her foot in [place] salt she has been using creams and lotions.  She states she is been to the emergency room and urgent care center both recently and treated with Keflex.  Patient states she usually only drinks Sprite but she states that during her week at the beach on vacation she drank water.  She states she is now drinking Gatorade.  She is status post partial right great toenail excision.  Assessment & Plan: Visit Diagnoses:  1. Pain in right foot   2. Achilles tendon contracture, right     Plan: Patient was demonstrated 2 different ways to stretch her Achilles she shall do this 5 times a day a minute at a time follow-up as needed.  Patient may need formalized physical therapy to help with the Achilles stretching.  Recommended supportive sneakers.  Patient states she cannot get sneakers on due to her abdominal distention and ascites.  Follow-Up Instructions: Return if symptoms worsen or fail to improve.   Ortho Exam  Patient is alert, oriented, no adenopathy, well-dressed, normal affect, normal respiratory effort. Examination patient's calf is 40 cm in circumference she has a partial nail removal of the right great toenail but there is no tenderness to palpation no cellulitis no signs of infection.  Her pain primarily is beneath the first metatarsal head with callus and cracked skin secondary to pressure overloading.  With her knee extended she has dorsiflexion 10 degrees short of neutral.   The Achilles contracture is the cause of her callus.  The Achilles is intact no evidence of ligamentous injury to the foot or ankle.  Imaging: XR Foot Complete Right  Result Date: 08/24/2020 Three-view radiographs of the right foot shows no fractures no joint space abnormalities.  No images are attached to the encounter.  Labs: Lab Results  Component Value Date   HGBA1C 5.9 (H) 06/17/2016   HGBA1C 5.0 12/09/2013   REPTSTATUS 05/01/2020 FINAL 04/29/2020   GRAMSTAIN  01/06/2020    MODERATE WBC PRESENT,BOTH PMN AND MONONUCLEAR RARE GRAM POSITIVE COCCI FEW YEAST    CULT 10,000 COLONIES/mL ENTEROCOCCUS FAECALIS (A) 04/29/2020   LABORGA ENTEROCOCCUS FAECALIS (A) 04/29/2020     Lab Results  Component Value Date   ALBUMIN 4.1 07/17/2020   ALBUMIN 3.7 05/17/2020   ALBUMIN 4.0 04/29/2020    Lab Results  Component Value Date   MG 2.2 01/05/2020   MG 1.9 10/28/2013   MG 1.4 (L) 10/26/2013   No results found for: VD25OH  No results found for: PREALBUMIN CBC EXTENDED Latest Ref Rng & Units 07/17/2020 05/17/2020 04/29/2020  WBC 4.0 - 10.5 K/uL 8.1 4.1 4.9  RBC 3.87 - 5.11 MIL/uL 5.16(H) 4.04 4.82  HGB 12.0 - 15.0 g/dL 13.6 10.3(L) 12.2  HCT 36.0 - 46.0 % 42.8 33.7(L) 40.1  PLT 150 -  400 K/uL 153 145(L) 161  NEUTROABS 1.7 - 7.7 K/uL - 2.5 -  LYMPHSABS 0.7 - 4.0 K/uL - 1.2 -     There is no height or weight on file to calculate BMI.  Orders:  Orders Placed This Encounter  Procedures   XR Foot Complete Right   No orders of the defined types were placed in this encounter.    Procedures: No procedures performed  Clinical Data: No additional findings.  ROS:  All other systems negative, except as noted in the HPI. Review of Systems  Objective: Vital Signs: There were no vitals taken for this visit.  Specialty Comments:  No specialty comments available.  PMFS History: Patient Active Problem List   Diagnosis Date Noted   Severe sepsis with acute organ dysfunction  ([place]) 01/04/2020   Primary osteoarthritis of right knee 12/04/2019   Primary osteoarthritis of left knee 12/04/2019   Vitamin D deficiency 09/20/2019   Cellulitis of foot 09/18/2019   Aspiration pneumonia of both lower lobes due to gastric secretions ([place]) 06/02/2019   Lumbar disc herniation 05/04/2019   Encounter for colorectal cancer screening 04/19/2019   Acute respiratory distress 04/06/2019   Hyperglycemia 04/06/2019   Pleural effusion on right 04/06/2019   Benzodiazepine dependence ([place]) 02/16/2019   Cervical stenosis of spinal canal 02/15/2019   Streptococcal sepsis ([place]) 01/22/2019   Acute bronchitis due to Rhinovirus 11/06/2018   Lung collapse 11/01/2018   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 08/13/2018   Compression fracture of body of thoracic vertebra (HCC) 08/01/2018   Closed nondisplaced fracture of greater tuberosity of right humerus 07/08/2018   On rivaroxaban therapy 07/08/2018   Chronic anticoagulation 05/27/2018   Falls frequently 05/27/2018   History of DVT (deep vein thrombosis) 03/16/2018   History of pulmonary embolism 03/10/2018   Left lower quadrant abdominal pain 03/10/2018   Chronic pain syndrome 03/05/2018   COPD exacerbation ([place]) 06/09/2016   Abnormal urine odor 09/26/2015   Recurrent pulmonary embolism ([place]) 06/26/2015   Lupus anticoagulant positive 06/26/2015   Thrombocytopenia ([place]) 05/31/2015   UTI (lower urinary tract infection) 05/26/2015   Right flank pain    Patchy loss of hair 03/08/2015   Primary osteoarthritis of both knees 02/25/2015   Incarcerated incisional hernia s/p lap repair w mesh 12/10/2014 12/10/2014   DVT (deep venous thrombosis) ([place]) 10/31/2013   Pulmonary embolism ([place]) 10/26/2013   Kidney stone 10/04/2013   Tobacco abuse 10/04/2013   COPD (chronic obstructive pulmonary disease) ([place])    Unspecified constipation 12/23/2012   CKD (chronic kidney disease), stage III ([place]) 12/21/2012    Allergic rhinitis 06/29/2012   Chronic low back pain 06/29/2012   Dysuria 06/29/2012   Edema 06/29/2012   Gastro-esophageal reflux disease without esophagitis 06/29/2012   Hemorrhoids 06/29/2012   Hirsutism 06/29/2012   Insomnia 06/29/2012   Localized superficial swelling, mass, or lump 06/29/2012   Urinary incontinence 06/29/2012   Skin sensation disturbance 06/29/2012   Shortness of breath 06/29/2012   Pulmonary embolism and infarction ([place]) 06/29/2012   Proteinuria 06/29/2012   Nondependent cannabis abuse 06/29/2012   Acquired cyst of kidney 10/24/2011   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   Cocaine abuse ([place]) 11/27/2010   Depressive disorder 09/23/2009   Ascites 09/23/2009   Obesity 09/01/2008   Hepatitis B virus infection 08/31/2008   Acute hepatitis C virus infection 08/31/2008   Iron deficiency anemia 08/31/2008   Anxiety state 08/31/2008   Essential hypertension 08/31/2008   Coronary atherosclerosis 08/31/2008  Hepatic cirrhosis ([place]) 08/31/2008   Past Medical History:  Diagnosis Date   Agoraphobia with panic attacks    Altered mental state 10/26/2013   Ankle fracture, left 11/27/2010   S/p ORIF 11/2    Anxiety    Arthritis    "knees; lower back" (10/30/2013)   Bipolar disorder ([place])    Cavitary pneumonia    Cirrhosis ([place])    CKD (chronic kidney disease), stage III ([place])    COPD (chronic obstructive pulmonary disease) ([place])    Depression    DVT (deep venous thrombosis) ([place]) 09/2013   RLE   GERD (gastroesophageal reflux disease)    Heart murmur    Hepatitis B    History of blood transfusion    "due to excessive blood loss before hysterectomy"   History of urinary tract infection    Hypertension    currently on no medication    Kidney stones    Pneumonia    "4 times in the past year" (10/30/2013)   Pulmonary emboli ([place]) 09/2013   Shortness of breath dyspnea    increased exertion;exercise   Urinary frequency    Urinary incontinence     Family  History  Problem Relation Age of Onset   Stroke Mother    Lung cancer Father     Past Surgical History:  Procedure Laterality Date   ABDOMINAL HYSTERECTOMY     ANKLE FRACTURE SURGERY Left    CARPAL TUNNEL RELEASE Left    DILATION AND CURETTAGE OF UTERUS  X 2   FRACTURE SURGERY     HAND SURGERY     1980's/left hand    INSERTION OF MESH N/A 12/10/2014   Procedure: INSERTION OF MESH;  Surgeon: Michael Boston, MD;  Location: [facility];  Service: General;  Laterality: N/A;   LAPAROSCOPIC ASSISTED VENTRAL HERNIA REPAIR N/A 12/10/2014   Procedure: LAPAROSCOPIC ASSISTED REPAIR OF INCARCERATED UMBILICAL HERNIA ;  Surgeon: Michael Boston, MD;  Location: [facility];  Service: General;  Laterality: N/A;   TUBAL LIGATION     Social History   Occupational History   Not on file  Tobacco Use   Smoking status: Former    Packs/day: 0.50    Years: 35.00    Pack years: 17.50    Types: Cigarettes    Quit date: 10/26/2013    Years since quitting: 6.8   Smokeless tobacco: Never  Vaping Use   Vaping Use: Some days   Substances: Nicotine, Flavoring  Substance and Sexual Activity   Alcohol use: No    Alcohol/week: 0.0 standard drinks    Comment: "stopped drinking  in 2004"   Drug use: No    Types: "Crack" cocaine, Marijuana    Comment: 10/30/2013 "last crack in 2014; last marijuana in ~ 1990" drug free for last 3 years    Sexual activity: Never

## 2020-08-25 ENCOUNTER — Telehealth: Payer: Self-pay | Admitting: Orthopedic Surgery

## 2020-08-25 NOTE — Telephone Encounter (Signed)
I called [name] and advised that I have printed achilles tendonitis exercises and the name of the shoes Dr. Sharol Given suggests ( new balance or [place]) I put this information in the mail for the [name]. I called to advise and half was through the call the phone cut out. Was not able to reach [name] after.

## 2020-08-25 NOTE — Telephone Encounter (Signed)
Patient called asked if she can get in writing what she was told yesterday by Dr Sharol Given. Patient said she couldn't remember what exercise Dr Sharol Given told her to do. Patient said her daughter also want to know what her mother was told during the visit. Patient said someone told her to get some billy tennis shoes  to wear. Patient said she has never heard of this tennis shoe. Patient asked for a call back when this information is mailed to her. The number to contact patient is [phone]

## 2020-09-16 ENCOUNTER — Telehealth: Payer: Self-pay

## 2020-09-16 NOTE — Telephone Encounter (Signed)
Noted  

## 2020-09-16 NOTE — Telephone Encounter (Signed)
Pt would like to get a pre approval for a gel injection in her right knee.

## 2020-09-20 ENCOUNTER — Encounter: Payer: Self-pay | Admitting: Orthopaedic Surgery

## 2020-09-20 ENCOUNTER — Ambulatory Visit (INDEPENDENT_AMBULATORY_CARE_PROVIDER_SITE_OTHER): Payer: Medicare Other | Admitting: Orthopaedic Surgery

## 2020-09-20 ENCOUNTER — Other Ambulatory Visit: Payer: Self-pay

## 2020-09-20 DIAGNOSIS — M25561 Pain in right knee: Secondary | ICD-10-CM

## 2020-09-20 DIAGNOSIS — M25512 Pain in left shoulder: Secondary | ICD-10-CM | POA: Diagnosis not present

## 2020-09-20 DIAGNOSIS — G8929 Other chronic pain: Secondary | ICD-10-CM

## 2020-09-20 MED ORDER — METHYLPREDNISOLONE ACETATE 40 MG/ML IJ SUSP
40.0000 mg | INTRAMUSCULAR | Status: AC | PRN
  Administered 2020-09-20: 40 mg via INTRA_ARTICULAR

## 2020-09-20 MED ORDER — LIDOCAINE HCL 1 % IJ SOLN
3.0000 mL | INTRAMUSCULAR | Status: AC | PRN
  Administered 2020-09-20: 3 mL

## 2020-09-20 NOTE — Progress Notes (Signed)
Office Visit Note   Patient: [name]           Date of Birth: 04/10/1955           MRN: [id number] Visit Date: 09/20/2020              Requested by: Bernerd Limbo, MD [address] [place],  [place] [id number] PCP: Bernerd Limbo, MD   Assessment & Plan: Visit Diagnoses:  1. Chronic left shoulder pain   2. Chronic pain of right knee     Plan: I did place a steroid injection in her left shoulder without difficulty per her request.  I then aspirated 30 to 40 cc of yellow fluid from the right knee.  At this point I feel it is appropriate to try a knee brace for her right knee and to send her for MRI of the right knee so we can better assess the cartilage and look for the potential for meniscal tear given the locking and catching.  We will see her back in 3 weeks hopefully by then she will have had the MRI.  All questions and concerns were answered and addressed.  Follow-Up Instructions: Return in about 3 weeks (around 10/11/2020).   Orders:  Orders Placed This Encounter  Procedures   Large Joint Inj   No orders of the defined types were placed in this encounter.     Procedures: Large Joint Inj: L subacromial bursa on 09/20/2020 1:58 PM Indications: pain and diagnostic evaluation Details: 22 G 1.5 in needle  Arthrogram: No  Medications: 3 mL lidocaine 1 %; 40 mg methylPREDNISolone acetate 40 MG/ML Outcome: tolerated well, no immediate complications Procedure, treatment alternatives, risks and benefits explained, specific risks discussed. Consent was given by the patient. Immediately prior to procedure a time out was called to verify the correct patient, procedure, equipment, support staff and site/side marked as required. Patient was prepped and draped in the usual sterile fashion.      Clinical Data: No additional findings.   Subjective: Chief Complaint  Patient presents with   Left Shoulder - Follow-up  The patient comes in with chronic left  shoulder issues and chronic right knee issues.  I have aspirated a large amount of fluid off of this knee before and placed a steroid injection.  X-rays still showed a well-maintained joint space earlier this year.  Left shoulder does have a full-thickness and retracted rotator cuff tear and arthritis of glenohumeral joint with the only treatment for her shoulder being a shoulder arthroplasty.  She is not ready for any type of surgery she states.  She still been dealing with right knee pain with a recurrent effusion again of the right knee.  She is requesting a steroid injection at least her left shoulder today as well.  HPI  Review of Systems There is currently listed no headache, chest pain, shortness of breath, fever, chills, nausea, vomiting  Objective: Vital Signs: There were no vitals taken for this visit.  Physical Exam She is alert and oriented x3 and in no acute distress Ortho Exam Examination of her left shoulder shows a well located shoulder with significant weakness of the rotator cuff and pain at the glenohumeral joint.  The right knee has a valgus malalignment and a large effusion. Specialty Comments:  No specialty comments available.  Imaging: No results found.   PMFS History: Patient Active Problem List   Diagnosis Date Noted   Severe sepsis with acute organ dysfunction ([place]) 01/04/2020  Primary osteoarthritis of right knee 12/04/2019   Primary osteoarthritis of left knee 12/04/2019   Vitamin D deficiency 09/20/2019   Cellulitis of foot 09/18/2019   Aspiration pneumonia of both lower lobes due to gastric secretions (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 08/13/2018   Compression fracture of body of thoracic vertebra (HCC) 08/01/2018   Closed nondisplaced fracture of greater tuberosity of right humerus 07/08/2018   On rivaroxaban therapy 07/08/2018   Chronic anticoagulation 05/27/2018   Falls frequently 05/27/2018   History of DVT (deep vein thrombosis) 03/16/2018   History of pulmonary embolism 03/10/2018   Left lower quadrant abdominal pain 03/10/2018   Chronic pain syndrome 03/05/2018   COPD exacerbation ([place]) 06/09/2016   Abnormal urine odor 09/26/2015   Recurrent pulmonary embolism ([place]) 06/26/2015   Lupus anticoagulant positive 06/26/2015   Thrombocytopenia ([place]) 05/31/2015   UTI (lower urinary tract infection) 05/26/2015   Right flank pain    Patchy loss of hair 03/08/2015   Primary osteoarthritis of both knees 02/25/2015   Incarcerated incisional hernia s/p lap repair w mesh 12/10/2014 12/10/2014   DVT (deep venous thrombosis) ([place]) 10/31/2013   Pulmonary embolism ([place]) 10/26/2013   Kidney stone 10/04/2013   Tobacco abuse 10/04/2013   COPD (chronic obstructive pulmonary disease) ([place])    Unspecified constipation 12/23/2012   CKD (chronic kidney disease), stage III ([place]) 12/21/2012   Allergic rhinitis 06/29/2012   Chronic low back pain 06/29/2012   Dysuria 06/29/2012   Edema 06/29/2012   Gastro-esophageal reflux disease without esophagitis 06/29/2012   Hemorrhoids 06/29/2012   Hirsutism 06/29/2012   Insomnia 06/29/2012   Localized superficial swelling, mass, or lump 06/29/2012   Urinary incontinence 06/29/2012   Skin sensation disturbance 06/29/2012   Shortness of breath 06/29/2012   Pulmonary embolism and infarction ([place]) 06/29/2012   Proteinuria 06/29/2012   Nondependent cannabis abuse 06/29/2012   Acquired cyst of kidney 10/24/2011   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    Cocaine abuse ([place]) 11/27/2010   Depressive disorder 09/23/2009   Ascites 09/23/2009   Obesity 09/01/2008   Hepatitis B virus infection 08/31/2008   Acute hepatitis C virus infection 08/31/2008   Iron deficiency anemia 08/31/2008   Anxiety state 08/31/2008   Essential hypertension 08/31/2008   Coronary atherosclerosis 08/31/2008   Hepatic cirrhosis ([place]) 08/31/2008   Past Medical History:  Diagnosis Date   Agoraphobia with panic attacks    Altered mental state 10/26/2013   Ankle fracture, left 11/27/2010   S/p ORIF 11/2    Anxiety    Arthritis    "knees; lower back" (10/30/2013)   Bipolar disorder ([place])    Cavitary pneumonia    Cirrhosis ([place])    CKD (chronic kidney disease), stage III ([place])    COPD (chronic obstructive pulmonary disease) ([place])    Depression    DVT (deep venous thrombosis) ([place]) 09/2013   RLE   GERD (gastroesophageal reflux disease)    Heart murmur    Hepatitis B    History of blood transfusion    "due to excessive blood loss before hysterectomy"   History of urinary  tract infection    Hypertension    currently on no medication    Kidney stones    Pneumonia    "4 times in the past year" (10/30/2013)   Pulmonary emboli (HCC) 09/2013   Shortness of breath dyspnea    increased exertion;exercise   Urinary frequency    Urinary incontinence     Family History  Problem Relation Age of Onset   Stroke Mother    Lung cancer Father     Past Surgical History:  Procedure Laterality Date   ABDOMINAL HYSTERECTOMY     ANKLE FRACTURE SURGERY Left    CARPAL TUNNEL RELEASE Left    DILATION AND CURETTAGE OF UTERUS  X 2   FRACTURE SURGERY     HAND SURGERY     1980's/left hand    INSERTION OF MESH N/A 12/10/2014   Procedure: INSERTION OF MESH;  Surgeon: Michael Boston, MD;  Location: [facility];  Service: General;  Laterality: N/A;   LAPAROSCOPIC ASSISTED VENTRAL HERNIA REPAIR N/A 12/10/2014   Procedure: LAPAROSCOPIC ASSISTED REPAIR OF INCARCERATED UMBILICAL HERNIA ;   Surgeon: Michael Boston, MD;  Location: [facility];  Service: General;  Laterality: N/A;   TUBAL LIGATION     Social History   Occupational History   Not on file  Tobacco Use   Smoking status: Former    Packs/day: 0.50    Years: 35.00    Pack years: 17.50    Types: Cigarettes    Quit date: 10/26/2013    Years since quitting: 6.9   Smokeless tobacco: Never  Vaping Use   Vaping Use: Some days   Substances: Nicotine, Flavoring  Substance and Sexual Activity   Alcohol use: No    Alcohol/week: 0.0 standard drinks    Comment: "stopped drinking  in 2004"   Drug use: No    Types: "Crack" cocaine, Marijuana    Comment: 10/30/2013 "last crack in 2014; last marijuana in ~ 1990" drug free for last 3 years    Sexual activity: Never

## 2020-10-01 ENCOUNTER — Telehealth: Payer: Self-pay

## 2020-10-01 NOTE — Telephone Encounter (Signed)
VOB submitted for SynviscOne, right knee. Pending BV. 

## 2020-10-04 ENCOUNTER — Telehealth: Payer: Self-pay

## 2020-10-04 NOTE — Telephone Encounter (Signed)
Approved for SynviscOne, right knee. [place] Patient will be responsible for 20% OOP. No Co-pay No PA required  Appt. 10/11/2020 with Dr. Ninfa Linden

## 2020-10-05 ENCOUNTER — Ambulatory Visit
Admission: RE | Admit: 2020-10-05 | Discharge: 2020-10-05 | Disposition: A | Discharge status: Home / Self Care | Payer: Medicare Other | Source: Ambulatory Visit | Attending: Orthopaedic Surgery | Admitting: Orthopaedic Surgery

## 2020-10-05 ENCOUNTER — Other Ambulatory Visit: Payer: Self-pay

## 2020-10-05 DIAGNOSIS — G8929 Other chronic pain: Secondary | ICD-10-CM

## 2020-10-05 DIAGNOSIS — M25561 Pain in right knee: Secondary | ICD-10-CM

## 2020-10-11 ENCOUNTER — Ambulatory Visit (INDEPENDENT_AMBULATORY_CARE_PROVIDER_SITE_OTHER): Payer: Medicare Other | Admitting: Orthopaedic Surgery

## 2020-10-11 ENCOUNTER — Encounter: Payer: Self-pay | Admitting: Orthopaedic Surgery

## 2020-10-11 DIAGNOSIS — M1711 Unilateral primary osteoarthritis, right knee: Secondary | ICD-10-CM | POA: Diagnosis not present

## 2020-10-11 MED ORDER — HYLAN G-F 20 48 MG/6ML IX SOSY
48.0000 mg | PREFILLED_SYRINGE | INTRA_ARTICULAR | Status: AC | PRN
  Administered 2020-10-11: 48 mg via INTRA_ARTICULAR

## 2020-10-11 NOTE — Progress Notes (Signed)
   Procedure Note  Patient: [name]             Date of Birth: Jun 06, 1955           MRN: [id number]             Visit Date: 10/11/2020  Procedures: Visit Diagnoses:  1. Primary osteoarthritis of right knee     Large Joint Inj: R knee on 10/11/2020 3:37 PM Indications: pain and diagnostic evaluation Details: 22 G 1.5 in needle, superolateral approach  Arthrogram: No  Medications: 48 mg Hylan 48 MG/6ML Outcome: tolerated well, no immediate complications Procedure, treatment alternatives, risks and benefits explained, specific risks discussed. Consent was given by the patient. Immediately prior to procedure a time out was called to verify the correct patient, procedure, equipment, support staff and site/side marked as required. Patient was prepped and draped in the usual sterile fashion.   Patient comes in today for a Synvisc 1 injection in her right knee to treat the pain from osteoarthritis.  She has had a recent MRI of the right knee showing some areas of full-thickness cartilage loss of the lateral compartment of the knee and patellofemoral joint.  She does have recurrent effusion today and I was able to aspirate about 30 cc of fluid off of her right knee.  She is only 65 years old.  She does ambulate with a cane.  She is trying conservative treatment.  Hyaluronic acid has helped in the past and she had failed steroid injections with her right knee.  She does have valgus malalignment of that knee.  I did place Synvisc 1 in her right knee without difficulty.  We will see her back in 2 months to see how she is doing from that but also to see her for her shoulder for another steroid injection in her shoulder.

## 2020-12-15 ENCOUNTER — Ambulatory Visit: Payer: Medicare Other | Admitting: Orthopaedic Surgery

## 2021-01-12 ENCOUNTER — Ambulatory Visit: Payer: Medicare Other | Admitting: Physician Assistant

## 2021-02-11 ENCOUNTER — Telehealth: Payer: Self-pay | Admitting: Orthopaedic Surgery

## 2021-02-11 NOTE — Telephone Encounter (Signed)
Patient called. She would like gel injection in her knee and shoulder. Her call back number is [phone]

## 2021-02-11 NOTE — Telephone Encounter (Signed)
Will call patient.  No longer do gel injections in the shoulder.

## 2021-02-14 NOTE — Telephone Encounter (Signed)
Called and left a Vm advising patient to give me a call back concerning gel injection for her knee.

## 2021-02-15 ENCOUNTER — Telehealth: Payer: Self-pay | Admitting: Orthopaedic Surgery

## 2021-02-15 NOTE — Telephone Encounter (Signed)
Pt called. Pt is wanting an update on her Right knee and left shoulder injection.   CB [phone]

## 2021-02-16 NOTE — Telephone Encounter (Signed)
Called and left a VM for patient to CB concerning gel injection.

## 2021-03-09 ENCOUNTER — Ambulatory Visit: Payer: Medicare Other | Admitting: Physician Assistant

## 2021-03-23 ENCOUNTER — Telehealth: Payer: Self-pay | Admitting: Orthopaedic Surgery

## 2021-03-23 NOTE — Telephone Encounter (Signed)
Patient called asked if she can get the Orthovisc  injection in her right knee? Patient asked if she can also get her right knee X-rayed because she fell about a month ago? Patient said she need to get a cortisone injection in her left shoulder when she get the Orthovisc injection. The number to contact patient is [phone] ?

## 2021-03-24 NOTE — Telephone Encounter (Signed)
Called and left a VM advising patient that next available gel injection would need to be after 04/10/2021.  Advised to call back with any questions. ?

## 2021-03-28 ENCOUNTER — Telehealth: Payer: Self-pay

## 2021-03-28 NOTE — Telephone Encounter (Signed)
Called and left a VM advising patient to CB to schedule for gel injection with Dr. Ninfa Linden after 04/10/2021. ? ?Approved for SynviscOne, right knee. ?[place] ?Patient will be responsible for 20% OOP. ?No Co-pay ?No PA required ? ? ?

## 2021-03-31 ENCOUNTER — Telehealth: Payer: Self-pay

## 2021-03-31 NOTE — Telephone Encounter (Deleted)
VOB submitted for Durolane, right knee. ?BV pending. ?Appointment needs to be after 04/10/2021. ?

## 2021-04-01 NOTE — Telephone Encounter (Signed)
Patient previously approved for gel injection. ?

## 2021-04-13 ENCOUNTER — Encounter: Payer: Self-pay | Admitting: Orthopaedic Surgery

## 2021-04-13 ENCOUNTER — Ambulatory Visit (INDEPENDENT_AMBULATORY_CARE_PROVIDER_SITE_OTHER): Payer: Medicare Other | Admitting: Orthopaedic Surgery

## 2021-04-13 ENCOUNTER — Ambulatory Visit (INDEPENDENT_AMBULATORY_CARE_PROVIDER_SITE_OTHER): Payer: Medicare Other

## 2021-04-13 DIAGNOSIS — M25512 Pain in left shoulder: Secondary | ICD-10-CM | POA: Diagnosis not present

## 2021-04-13 DIAGNOSIS — G8929 Other chronic pain: Secondary | ICD-10-CM | POA: Diagnosis not present

## 2021-04-13 DIAGNOSIS — M1711 Unilateral primary osteoarthritis, right knee: Secondary | ICD-10-CM | POA: Diagnosis not present

## 2021-04-13 MED ORDER — METHYLPREDNISOLONE ACETATE 40 MG/ML IJ SUSP
40.0000 mg | INTRAMUSCULAR | Status: AC | PRN
  Administered 2021-04-13: 40 mg via INTRA_ARTICULAR

## 2021-04-13 MED ORDER — HYLAN G-F 20 48 MG/6ML IX SOSY
48.0000 mg | PREFILLED_SYRINGE | INTRA_ARTICULAR | Status: AC | PRN
  Administered 2021-04-13: 48 mg via INTRA_ARTICULAR

## 2021-04-13 MED ORDER — LIDOCAINE HCL 1 % IJ SOLN
3.0000 mL | INTRAMUSCULAR | Status: AC | PRN
  Administered 2021-04-13: 3 mL

## 2021-04-13 NOTE — Progress Notes (Signed)
? ?Office Visit Note ?  ?Patient: [name]           ?Date of Birth: [birth year]           ?MRN: [id number] ?Visit Date: 04/13/2021 ?             ?Requested by: Bernerd Limbo, MD ?[address] ?[address] ?[place],  [place] [id number] ?PCP: Bernerd Limbo, MD ? ? ?Assessment & Plan: ?Visit Diagnoses:  ?1. Primary osteoarthritis of right knee   ?2. Chronic left shoulder pain   ? ? ?Plan: I did place a steroid injection in her left shoulder subacromial outlet today which will definitely infiltrate to the glenohumeral joint given the traction of the rotator cuff tear.  Also place Synvisc 1 into her right knee to treat the pain from osteoarthritis.  We can see her back in 3 months to consider repeat steroid injections.  She knows that hyaluronic acid can be done again until at least 6 months. ? ?Follow-Up Instructions: Return in about 3 months (around 07/14/2021).  ? ?Orders:  ?Orders Placed This Encounter  ?Procedures  ? Large Joint Inj  ? Large Joint Inj  ? XR Knee 1-2 Views Right  ? ?No orders of the defined types were placed in this encounter. ? ? ? ? Procedures: ?Large Joint Inj: L subacromial bursa on 04/13/2021 9:15 AM ?Indications: pain and diagnostic evaluation ?Details: 22 G 1.5 in needle ? ?Arthrogram: No ? ?Medications: 3 mL lidocaine 1 %; 40 mg methylPREDNISolone acetate 40 MG/ML ?Outcome: tolerated well, no immediate complications ?Procedure, treatment alternatives, risks and benefits explained, specific risks discussed. Consent was given by the patient. Immediately prior to procedure a time out was called to verify the correct patient, procedure, equipment, support staff and site/side marked as required. Patient was prepped and draped in the usual sterile fashion.  ? ? ?Large Joint Inj: R knee on 04/13/2021 9:16 AM ?Indications: pain and diagnostic evaluation ?Details: 22 G 1.5 in needle, superolateral approach ? ?Arthrogram: No ? ?Medications: 48 mg Hylan 48 MG/6ML ?Outcome: tolerated well, no  immediate complications ?Procedure, treatment alternatives, risks and benefits explained, specific risks discussed. Consent was given by the patient. Immediately prior to procedure a time out was called to verify the correct patient, procedure, equipment, support staff and site/side marked as required. Patient was prepped and draped in the usual sterile fashion.  ? ? ? ? ?Clinical Data: ?No additional findings. ? ? ?Subjective: ?Chief Complaint  ?Patient presents with  ? Left Shoulder - Pain  ? Right Knee - Pain  ?The patient is a 66 year old female well-known to [place].  She was scheduled today to have a hyaluronic acid injection with Synvisc 1 in her right knee to treat the pain from osteoarthritis.  She did request x-rays of her right knee today since she fell last month and her knee is hurting her worse.  She has also known osteoarthritis of the left shoulder glenohumeral joint and a full-thickness retracted rotator cuff tear.  She is requesting steroid injection in her left shoulder today as well.  She has had no acute change in her medical status.  She denies being a diabetic.  She ambulates very slowly. ? ?HPI ? ?Review of Systems ?She currently denies any headache, chest pain, shortness of breath, fever, chills, nausea, vomiting ? ?Objective: ?Vital Signs: There were no vitals taken for this visit. ? ?Physical Exam ?She is alert and orient x3 and in no acute distress ?Ortho Exam ?Examination of her  left shoulder shows deficits of the rotator cuff with grinding of the glenohumeral joint.  Examination of her left knee shows no effusion.  Her extensor mechanism is intact.  There is global tenderness.  The knee is ligamentously stable. ?Specialty Comments:  ?No specialty comments available. ? ?Imaging: ?XR Knee 1-2 Views Right ? ?Result Date: 04/13/2021 ?2 views of the right knee show no acute findings.  There is no evidence of fracture.  ? ? ?PMFS History: ?Patient Active Problem List  ? Diagnosis Date Noted  ?  Severe sepsis with acute organ dysfunction ([place]) 01/04/2020  ? Primary osteoarthritis of right knee 12/04/2019  ? Primary osteoarthritis of left knee 12/04/2019  ? Vitamin D deficiency 09/20/2019  ? Cellulitis of foot 09/18/2019  ? Aspiration pneumonia of both lower lobes due to gastric secretions ([place]) 06/02/2019  ? Lumbar disc herniation 05/04/2019  ? Encounter for colorectal cancer screening 04/19/2019  ? Acute respiratory distress 04/06/2019  ? Hyperglycemia 04/06/2019  ? Pleural effusion on right 04/06/2019  ? Benzodiazepine dependence ([place]) 02/16/2019  ? Cervical stenosis of spinal canal 02/15/2019  ? Streptococcal sepsis ([place]) 01/22/2019  ? Acute bronchitis due to Rhinovirus 11/06/2018  ? Lung collapse 11/01/2018  ? 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 Compression fracture of body of thoracic vertebra ([place]) 08/01/2018  ? Closed nondisplaced fracture of greater tuberosity of right humerus 07/08/2018  ? On rivaroxaban therapy 07/08/2018  ? Chronic anticoagulation 05/27/2018  ? Falls frequently 05/27/2018  ? History of DVT (deep vein thrombosis) 03/16/2018  ? History of pulmonary embolism 03/10/2018  ? Left lower quadrant abdominal pain 03/10/2018  ? Chronic pain syndrome 03/05/2018  ? COPD exacerbation ([place]) 06/09/2016  ? Abnormal urine odor 09/26/2015  ? Recurrent pulmonary embolism ([place]) 06/26/2015  ? Lupus anticoagulant positive 06/26/2015  ? Thrombocytopenia ([place]) 05/31/2015  ? UTI (lower urinary tract infection) 05/26/2015  ? Right flank pain   ? Patchy loss of hair 03/08/2015  ? Primary osteoarthritis of both knees 02/25/2015  ? Incarcerated incisional hernia s/p lap repair w mesh 12/10/2014 12/10/2014  ? DVT (deep venous thrombosis) ([place]) 10/31/2013  ? Pulmonary embolism ([place]) 10/26/2013  ? Kidney stone 10/04/2013  ? Tobacco abuse 10/04/2013  ? COPD (chronic obstructive pulmonary disease) ([place])   ? Unspecified constipation 12/23/2012  ? CKD (chronic kidney  disease), stage III ([place]) 12/21/2012  ? Allergic rhinitis 06/29/2012  ? Chronic low back pain 06/29/2012  ? Dysuria 06/29/2012  ? Edema 06/29/2012  ? Gastro-esophageal reflux disease without esophagitis 06/29/2012  ? Hemorrhoids 06/29/2012  ? Hirsutism 06/29/2012  ? Insomnia 06/29/2012  ? Localized superficial swelling, mass, or lump 06/29/2012  ? Urinary incontinence 06/29/2012  ? Skin sensation disturbance 06/29/2012  ? Shortness of breath 06/29/2012  ? Pulmonary embolism and infarction ([place]) 06/29/2012  ? Proteinuria 06/29/2012  ? Nondependent cannabis abuse 06/29/2012  ? Acquired cyst of kidney 10/24/2011  ? 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  ? Cocaine abuse ([place]) 11/27/2010  ? Depressive disorder 09/23/2009  ? Ascites 09/23/2009  ? Obesity 09/01/2008  ? Hepatitis B virus infection 08/31/2008  ? Acute hepatitis C virus infection 08/31/2008  ? Iron deficiency anemia 08/31/2008  ? Anxiety state 08/31/2008  ? Essential hypertension 08/31/2008  ? Coronary atherosclerosis 08/31/2008  ? Hepatic cirrhosis ([place]) 08/31/2008  ? ?Past Medical History:  ?Diagnosis Date  ? Agoraphobia with panic attacks   ? Altered mental state 10/26/2013  ? Ankle fracture, left 11/27/2010  ? S/p ORIF 11/2   ?  Anxiety   ? Arthritis   ? "knees; lower back" (10/30/2013)  ? Bipolar disorder ([place])   ? Cavitary pneumonia   ? Cirrhosis ([place])   ? CKD (chronic kidney disease), stage III ([place])   ? COPD (chronic obstructive pulmonary disease) ([place])   ? Depression   ? DVT (deep venous thrombosis) ([place]) 09/2013  ? RLE  ? GERD (gastroesophageal reflux disease)   ? Heart murmur   ? Hepatitis B   ? History of blood transfusion   ? "due to excessive blood loss before hysterectomy"  ? History of urinary tract infection   ? Hypertension   ? currently on no medication   ? Kidney stones   ? Pneumonia   ? "4 times in the past year" (10/30/2013)  ? Pulmonary emboli ([place]) 09/2013  ? Shortness of breath dyspnea   ? increased exertion;exercise  ? Urinary frequency    ? Urinary incontinence   ?  ?Family History  ?Problem Relation Age of Onset  ? Stroke Mother   ? Lung cancer Father   ?  ?Past Surgical History:  ?Procedure Laterality Date  ? ABDOMINAL HYSTERECTOMY    ? ANKLE F

## 2021-04-25 ENCOUNTER — Encounter (HOSPITAL_COMMUNITY): Payer: Self-pay | Admitting: *Deleted

## 2021-04-25 ENCOUNTER — Other Ambulatory Visit: Payer: Self-pay

## 2021-04-25 ENCOUNTER — Emergency Department (HOSPITAL_COMMUNITY): Payer: Medicare Other

## 2021-04-25 ENCOUNTER — Emergency Department (HOSPITAL_COMMUNITY)
Admission: EM | Admit: 2021-04-25 | Discharge: 2021-04-25 | Disposition: A | Discharge status: Home / Self Care | Payer: Medicare Other | Attending: Emergency Medicine | Admitting: Emergency Medicine

## 2021-04-25 DIAGNOSIS — I10 Essential (primary) hypertension: Secondary | ICD-10-CM

## 2021-04-25 DIAGNOSIS — N189 Chronic kidney disease, unspecified: Secondary | ICD-10-CM | POA: Insufficient documentation

## 2021-04-25 DIAGNOSIS — I129 Hypertensive chronic kidney disease with stage 1 through stage 4 chronic kidney disease, or unspecified chronic kidney disease: Secondary | ICD-10-CM | POA: Diagnosis not present

## 2021-04-25 DIAGNOSIS — Z20822 Contact with and (suspected) exposure to covid-19: Secondary | ICD-10-CM | POA: Diagnosis not present

## 2021-04-25 DIAGNOSIS — M25512 Pain in left shoulder: Secondary | ICD-10-CM | POA: Diagnosis not present

## 2021-04-25 DIAGNOSIS — D696 Thrombocytopenia, unspecified: Secondary | ICD-10-CM | POA: Insufficient documentation

## 2021-04-25 DIAGNOSIS — Z7901 Long term (current) use of anticoagulants: Secondary | ICD-10-CM | POA: Diagnosis not present

## 2021-04-25 DIAGNOSIS — J441 Chronic obstructive pulmonary disease with (acute) exacerbation: Secondary | ICD-10-CM | POA: Diagnosis not present

## 2021-04-25 DIAGNOSIS — Z79899 Other long term (current) drug therapy: Secondary | ICD-10-CM | POA: Diagnosis not present

## 2021-04-25 DIAGNOSIS — R110 Nausea: Secondary | ICD-10-CM | POA: Insufficient documentation

## 2021-04-25 DIAGNOSIS — I2510 Atherosclerotic heart disease of native coronary artery without angina pectoris: Secondary | ICD-10-CM | POA: Insufficient documentation

## 2021-04-25 DIAGNOSIS — Z7951 Long term (current) use of inhaled steroids: Secondary | ICD-10-CM | POA: Diagnosis not present

## 2021-04-25 DIAGNOSIS — R0602 Shortness of breath: Secondary | ICD-10-CM | POA: Diagnosis present

## 2021-04-25 LAB — CBC WITH DIFFERENTIAL/PLATELET
Abs Immature Granulocytes: 0.02 10*3/uL (ref 0.00–0.07)
Basophils Absolute: 0 10*3/uL (ref 0.0–0.1)
Basophils Relative: 0 %
Eosinophils Absolute: 0 10*3/uL (ref 0.0–0.5)
Eosinophils Relative: 0 %
HCT: 42.2 % (ref 36.0–46.0)
Hemoglobin: 13.2 g/dL (ref 12.0–15.0)
Immature Granulocytes: 0 %
Lymphocytes Relative: 19 %
Lymphs Abs: 1 10*3/uL (ref 0.7–4.0)
MCH: 28.1 pg (ref 26.0–34.0)
MCHC: 31.3 g/dL (ref 30.0–36.0)
MCV: 90 fL (ref 80.0–100.0)
Monocytes Absolute: 0.3 10*3/uL (ref 0.1–1.0)
Monocytes Relative: 6 %
Neutro Abs: 4.1 10*3/uL (ref 1.7–7.7)
Neutrophils Relative %: 75 %
Platelets: 131 10*3/uL — ABNORMAL LOW (ref 150–400)
RBC: 4.69 MIL/uL (ref 3.87–5.11)
RDW: 13.9 % (ref 11.5–15.5)
WBC: 5.5 10*3/uL (ref 4.0–10.5)
nRBC: 0 % (ref 0.0–0.2)

## 2021-04-25 LAB — BASIC METABOLIC PANEL
Anion gap: 9 (ref 5–15)
BUN: 14 mg/dL (ref 8–23)
CO2: 26 mmol/L (ref 22–32)
Calcium: 9.7 mg/dL (ref 8.9–10.3)
Chloride: 104 mmol/L (ref 98–111)
Creatinine, Ser: 1.37 mg/dL — ABNORMAL HIGH (ref 0.44–1.00)
GFR, Estimated: 43 mL/min — ABNORMAL LOW (ref >60)
Glucose, Bld: 98 mg/dL (ref 70–99)
Potassium: 4 mmol/L (ref 3.5–5.1)
Sodium: 139 mmol/L (ref 135–145)

## 2021-04-25 LAB — RESP PANEL BY RT-PCR (FLU A&B, COVID) ARPGX2
Influenza A by PCR: NEGATIVE
Influenza B by PCR: NEGATIVE
SARS Coronavirus 2 by RT PCR: NEGATIVE

## 2021-04-25 LAB — BRAIN NATRIURETIC PEPTIDE: B Natriuretic Peptide: 37.9 pg/mL (ref 0.0–100.0)

## 2021-04-25 LAB — TROPONIN I (HIGH SENSITIVITY): Troponin I (High Sensitivity): 9 ng/L (ref <18)

## 2021-04-25 MED ORDER — PREDNISONE 10 MG PO TABS: 20.0000 mg | ORAL_TABLET | Freq: Two times a day (BID) | ORAL | 0 refills | Status: DC

## 2021-04-25 MED ORDER — CEFDINIR 300 MG PO CAPS: 300.0000 mg | ORAL_CAPSULE | Freq: Two times a day (BID) | ORAL | 0 refills | Status: DC

## 2021-04-25 MED ORDER — CLONIDINE HCL 0.2 MG PO TABS
0.2000 mg | ORAL_TABLET | Freq: Once | ORAL | Status: AC
  Administered 2021-04-25: 0.2 mg via ORAL
  Filled 2021-04-25: qty 1

## 2021-04-25 MED ORDER — ALBUTEROL SULFATE (2.5 MG/3ML) 0.083% IN NEBU
5.0000 mg | INHALATION_SOLUTION | Freq: Once | RESPIRATORY_TRACT | Status: AC
Start: 2021-04-25 — End: 2021-04-25
  Administered 2021-04-25: 5 mg via RESPIRATORY_TRACT
  Filled 2021-04-25: qty 6

## 2021-04-25 MED ORDER — METHYLPREDNISOLONE SODIUM SUCC 125 MG IJ SOLR
125.0000 mg | Freq: Once | INTRAMUSCULAR | Status: AC
Start: 2021-04-25 — End: 2021-04-25
  Administered 2021-04-25: 125 mg via INTRAVENOUS
  Filled 2021-04-25: qty 2

## 2021-04-25 MED ORDER — CEFDINIR 300 MG PO CAPS
600.0000 mg | ORAL_CAPSULE | Freq: Every day | ORAL | Status: DC
  Administered 2021-04-25: 600 mg via ORAL
  Filled 2021-04-25: qty 2

## 2021-04-25 NOTE — ED Provider Triage Note (Signed)
Emergency Medicine Provider Triage Evaluation Note ? ?[name] , a 66 y.o. female  was evaluated in triage.  Patient was brought in by EMS from home for shortness of breath with productive cough for 3 days.  Patient has history of COPD, currently on Symbicort and Proventil without any significant improvement.  She also has productive cough with thick white sputum.  Denies fevers but has chills.  Additionally, patient also complains of left shoulder pain that has been ongoing for quite a while but has been acutely exacerbated recently. ?Review of Systems  ?Positive: SOB, cough, shoulder pain, nausea ?Negative: fevers ? ?Physical Exam  ?BP (!) 179/102 (BP Location: Left Arm)   Pulse 64   Temp 97.9 ?F (36.6 ?C) (Oral)   Resp 17   SpO2 97%  ?Gen:   Awake, no distress   ?Resp:  Normal effort; diffuse expiratory wheezing in all lung fields, increased work of effort, 97% on room air ?MSK:   Moves extremities without difficulty  ?Other:   ? ?Medical Decision Making  ?Medically screening exam initiated at 12:13 PM.  Appropriate orders placed.  [name] was informed that the remainder of the evaluation will be completed by another provider, this initial triage assessment does not replace that evaluation, and the importance of remaining in the ED until their evaluation is complete. ? ? ?  ?Tonye Pearson, [place] ?04/25/21 1221 ? ?

## 2021-04-25 NOTE — ED Triage Notes (Signed)
Pt here via [facility] from home for shob w/ productive cough x3 days. Pt has been coughing up thick white sputum, pt states she has hx of pneumonia and this feels similar, hx of COPD. Pt states she woke up feeling shob on Friday and it has been getting worse since. Pt was c/o nausea and L shoulder pain (hx of shoulder problems). 158/107, 70HR, SpO2 96% on RA, pt placed on 2L O2 for comfort. 20g R wrist, EMS gave '4mg'$  zofran. Pt has not been taking daily meds, says she feels "too sick to get anything down" ?

## 2021-04-25 NOTE — Discharge Instructions (Signed)
Please take prednisone and antibiotics as prescribed ?Please use your inhaler every 4 hours ?Return if you are having worsening dyspnea or pain. ?Take your blood pressure medication as prescribed ?Follow-up with your doctor in the next 2 to 5 days. ?Return if you are having worsening symptoms ?

## 2021-04-25 NOTE — ED Notes (Signed)
Patient verbalizes understanding of discharge instructions. Opportunity for questioning and answers were provided. Armband removed by staff, pt discharged from ED.  

## 2021-04-25 NOTE — ED Provider Notes (Signed)
?[facility] ?Provider Note ? ? ?CSN: [id number] ?Arrival date & time: 04/25/21  1145 ? ?  ? ?History ? ?Chief Complaint  ?Patient presents with  ? Shortness of Breath  ? ? ?[name] is a 66 y.o. female. ? ?HPI ?66 year old female history of COPD, PE, hypertension, coronary artery disease, polysubstance abuse, CKD, DVT, presents today complaining of chills, cough, sharp chest discomfort, and chronic left shoulder pain.  Symptoms have been present for several days.  She denies fever but endorses chills.  She has had cough productive of yellowish sputum.  Has some associated dyspnea.  She reports some ongoing right lower leg swelling.  He is on rivaroxaban for history of DVT.  She has had some nausea.  Otherwise she has been taking p.o. well. ? ?  ? ?Home Medications ?Prior to Admission medications   ?Medication Sig Start Date End Date Taking? Authorizing Provider  ?cefdinir (OMNICEF) 300 MG capsule Take 1 capsule (300 mg total) by mouth 2 (two) times daily. 04/25/21  Yes Pattricia Boss, MD  ?predniSONE (DELTASONE) 10 MG tablet Take 2 tablets (20 mg total) by mouth 2 (two) times daily with a meal. 04/25/21  Yes Johnice Riebe, Andee Poles, MD  ?albuterol (PROAIR HFA) 108 (90 Base) MCG/ACT inhaler INHALE 2 PUFFS INTO THE LUNGS EVERY 4 HOURS AS NEEDED FOR WHEEZING ?Patient taking differently: Inhale 2 puffs into the lungs every 4 (four) hours as needed for shortness of breath or wheezing. 12/04/18   Rigoberto Noel, MD  ?albuterol (PROVENTIL) (2.5 MG/3ML) 0.083% nebulizer solution Take 3 mLs (2.5 mg total) by nebulization every 4 (four) hours as needed for wheezing or shortness of breath. 12/04/18   Rigoberto Noel, MD  ?ALPRAZolam Duanne Moron) 1 MG tablet Take 0.5 tablets (0.5 mg total) by mouth 2 (two) times daily as needed for anxiety. ?Patient taking differently: Take 1 mg by mouth 3 (three) times daily. 06/02/15   Elgergawy, Silver Huguenin, MD  ?baclofen (LIORESAL) 10 MG tablet Take 0.5-1 tablets (5-10 mg  total) by mouth 3 (three) times daily as needed for muscle spasms. 03/29/20   Hilts, Legrand Como, MD  ?baclofen (LIORESAL) 20 MG tablet Take 20 mg by mouth at bedtime. 04/22/20   [provider]  ?budesonide-formoterol (SYMBICORT) 160-4.5 MCG/ACT inhaler Inhale 2 puffs into the lungs 2 (two) times daily. 12/04/18 07/18/20  Rigoberto Noel, MD  ?cephALEXin (KEFLEX) 500 MG capsule Take 1 capsule (500 mg total) by mouth 3 (three) times daily. 07/29/20   Raspet, Derry Skill, PA-C  ?Cholecalciferol (VITAMIN D3) 50 MCG (2000 UT) TABS Take 2,000 Units by mouth daily.    [provider]  ?clotrimazole-betamethasone (LOTRISONE) cream Apply to affected area 2 times daily prn ?Patient taking differently: Apply 1 application topically 2 (two) times daily as needed (rash). Apply to affected area 2 times daily prn 04/29/20   Lajean Saver, MD  ?dicyclomine (BENTYL) 20 MG tablet Take 1 tablet (20 mg total) by mouth 2 (two) times daily. 07/18/20   Maudie Flakes, MD  ?docusate sodium (COLACE) 100 MG capsule Take 100 mg by mouth daily.    [provider]  ?entecavir (BARACLUDE) 0.5 MG tablet Take 0.5 mg by mouth every other day. 11/25/19   [provider]  ?famotidine (PEPCID) 20 MG tablet Take 20 mg by mouth 2 (two) times daily. 10/28/19   [provider]  ?gabapentin (NEURONTIN) 300 MG capsule Take 300 mg by mouth at bedtime. 07/13/20   [provider]  ?levocetirizine (  XYZAL) 5 MG tablet Take 5 mg by mouth every evening. 12/02/19   [provider]  ?loperamide (IMODIUM) 2 MG capsule Take 1 capsule (2 mg total) by mouth 4 (four) times daily as needed for diarrhea or loose stools. 07/18/20   Maudie Flakes, MD  ?magnesium hydroxide (MILK OF MAGNESIA) 400 MG/5ML suspension Take 30 mLs by mouth daily as needed for mild constipation.    [provider]  ?metoprolol tartrate (LOPRESSOR) 25 MG tablet Take 25 mg by mouth 2 (two) times daily. 04/22/20   [provider]   ?montelukast (SINGULAIR) 10 MG tablet Take 10 mg by mouth at bedtime.    [provider]  ?Multiple Vitamins-Minerals (MULTIVITAMIN WOMEN 50+) TABS Take 1 tablet by mouth daily.    [provider]  ?naloxone Karma Greaser) 4 MG/0.1ML LIQD nasal spray kit Place 4 mg into the nose once as needed (for accidental overdose). 07/01/19   [provider]  ?ondansetron (ZOFRAN ODT) 4 MG disintegrating tablet Take 1 tablet (4 mg total) by mouth every 8 (eight) hours as needed for nausea or vomiting. 07/18/20   Maudie Flakes, MD  ?Oxycodone HCl 10 MG TABS Take 10 mg by mouth every 4 (four) hours as needed. Take 10 mg by mouth every four to six hours as needed for pain 10/19/18   [provider]  ?pantoprazole (PROTONIX) 40 MG tablet Take 40 mg by mouth 2 (two) times daily before a meal. 12/23/13   [provider]  ?QUEtiapine (SEROQUEL) 300 MG tablet Take 300 mg by mouth at bedtime. 05/06/20   [provider]  ?rivaroxaban (XARELTO) 20 MG TABS tablet Take 20 mg by mouth daily.    [provider]  ?spironolactone (ALDACTONE) 50 MG tablet Take 50 mg by mouth daily. 04/13/20   [provider]  ?sucralfate (CARAFATE) 1 g tablet Take 1 g by mouth 4 (four) times daily. 07/22/19   [provider]  ?SYMPROIC 0.2 MG TABS Take 0.2 mg by mouth daily. 04/30/20   [provider]  ?enoxaparin (LOVENOX) 120 MG/0.8ML SOLN Inject 0.8 mLs (120 mg total) into the skin every 12 (twelve) hours. 11/30/10 04/16/11  Jenelle Mages, MD  ?loratadine (CLARITIN) 10 MG tablet Take 10 mg by mouth daily.    04/16/11  [provider]  ?   ? ?Allergies    ?Amitriptyline hcl, Azithromycin, Fentanyl, Gabapentin, Hydromorphone hcl, Cymbalta [duloxetine hcl], Dilaudid [hydromorphone], and Neomycin-bacitracin zn-polymyx   ? ?Review of Systems   ?Review of Systems  ?All other systems reviewed and are negative. ? ?Physical Exam ?Updated Vital Signs ?BP (!) 176/116    Pulse 75   Temp 97.9 ?F (36.6 ?C) (Oral)   Resp 17   SpO2 97%  ?Physical Exam ?Vitals and nursing note reviewed.  ?Constitutional:   ?   General: She is not in acute distress. ?   Appearance: She is well-developed.  ?HENT:  ?   Head: Normocephalic.  ?   Mouth/Throat:  ?   Mouth: Mucous membranes are moist.  ?Eyes:  ?   Pupils: Pupils are equal, round, and reactive to light.  ?Cardiovascular:  ?   Rate and Rhythm: Normal rate and regular rhythm.  ?Pulmonary:  ?   Effort: Pulmonary effort is normal.  ?   Breath sounds: Wheezing present. No decreased breath sounds, rhonchi or rales.  ?Abdominal:  ?   Palpations: Abdomen is soft.  ?Musculoskeletal:     ?   General: Normal range of motion.  ?  Cervical back: Normal range of motion and neck supple.  ?Skin: ?   General: Skin is warm.  ?   Capillary Refill: Capillary refill takes less than 2 seconds.  ?Neurological:  ?   General: No focal deficit present.  ?   Mental Status: She is alert.  ?Psychiatric:     ?   Mood and Affect: Mood normal.  ? ? ?ED Results / Procedures / Treatments   ?Labs ?(all labs ordered are listed, but only abnormal results are displayed) ?Labs Reviewed  ?CBC WITH DIFFERENTIAL/PLATELET - Abnormal; Notable for the following components:  ?    Result Value  ? Platelets 131 (*)   ? All other components within normal limits  ?BASIC METABOLIC PANEL - Abnormal; Notable for the following components:  ? Creatinine, Ser 1.37 (*)   ? GFR, Estimated 43 (*)   ? All other components within normal limits  ?RESP PANEL BY RT-PCR (FLU A&B, COVID) ARPGX2  ?BRAIN NATRIURETIC PEPTIDE  ?TROPONIN I (HIGH SENSITIVITY)  ? ? ?EKG ?EKG Interpretation ? ?Date/Time:  Monday April 25 2021 11:58:49 EDT ?Ventricular Rate:  65 ?PR Interval:  178 ?QRS Duration: 98 ?QT Interval:  398 ?QTC Calculation: 413 ?R Axis:   -29 ?Text Interpretation: Normal sinus rhythm Normal ECG When compared with ECG of 18-Jul-2020 02:01, PREVIOUS ECG IS PRESENT Confirmed by Pattricia Boss [id number]) on  04/25/2021 1:42:22 PM ? ?Radiology ?DG Chest 2 View ? ?Result Date: 04/25/2021 ?CLINICAL DATA:  Shortness of breath and cough EXAM: CHEST - 2 VIEW COMPARISON:  05/17/2020 FINDINGS: AP and lateral views. The AP view is mildly degr

## 2021-05-19 ENCOUNTER — Other Ambulatory Visit: Payer: Self-pay

## 2021-05-19 ENCOUNTER — Emergency Department (HOSPITAL_COMMUNITY)
Admission: EM | Admit: 2021-05-19 | Discharge: 2021-05-19 | Disposition: A | Discharge status: Home / Self Care | Payer: Medicare Other | Attending: Emergency Medicine | Admitting: Emergency Medicine

## 2021-05-19 ENCOUNTER — Encounter (HOSPITAL_COMMUNITY): Payer: Self-pay

## 2021-05-19 ENCOUNTER — Telehealth: Payer: Self-pay

## 2021-05-19 DIAGNOSIS — M25512 Pain in left shoulder: Secondary | ICD-10-CM | POA: Insufficient documentation

## 2021-05-19 DIAGNOSIS — G8929 Other chronic pain: Secondary | ICD-10-CM | POA: Insufficient documentation

## 2021-05-19 DIAGNOSIS — Z79899 Other long term (current) drug therapy: Secondary | ICD-10-CM | POA: Diagnosis not present

## 2021-05-19 DIAGNOSIS — I10 Essential (primary) hypertension: Secondary | ICD-10-CM | POA: Insufficient documentation

## 2021-05-19 DIAGNOSIS — G894 Chronic pain syndrome: Secondary | ICD-10-CM

## 2021-05-19 MED ORDER — HYDROMORPHONE HCL 2 MG/ML IJ SOLN
2.0000 mg | Freq: Once | INTRAMUSCULAR | Status: DC
  Filled 2021-05-19: qty 1

## 2021-05-19 MED ORDER — OXYCODONE HCL 5 MG PO TABS
10.0000 mg | ORAL_TABLET | Freq: Once | ORAL | Status: AC
Start: 2021-05-19 — End: 2021-05-19
  Administered 2021-05-19: 10 mg via ORAL
  Filled 2021-05-19: qty 2

## 2021-05-19 MED ORDER — HYDROMORPHONE HCL 2 MG/ML IJ SOLN
2.0000 mg | Freq: Once | INTRAMUSCULAR | Status: AC
  Administered 2021-05-19: 2 mg via INTRAMUSCULAR

## 2021-05-19 MED ORDER — ONDANSETRON 8 MG PO TBDP
8.0000 mg | ORAL_TABLET | Freq: Once | ORAL | Status: AC
  Administered 2021-05-19: 8 mg via ORAL
  Filled 2021-05-19: qty 1

## 2021-05-19 NOTE — ED Triage Notes (Signed)
Pt from home via EMS with c/o left shoulder pain. Pt has chronic left shoulder pain but has not had any oxycodone since Monday since she ran out so the pain has worsened over the last two days. Pt reports she had a Dr's appointment with a pain doctor but was "in too much pain to go today." Patient is hypertensive at this time with a HA.  ?

## 2021-05-19 NOTE — Telephone Encounter (Signed)
Pt called and states that she is in terrible pain. She has a RTC tear and that CB said there was nothing he could do to help her but she is not able to dress herself and that pain is so intense that she is not able to sleep. The pt was crying on the phone and asked for a call back to discuss options.  ?

## 2021-05-19 NOTE — Discharge Instructions (Addendum)
You will have to contact your pain doctor for management of chronic pain and new prescriptions.  In the meantime use heat on the sore area of your shoulder and wear the sling for comfort.  You could also consider following up with your orthopedist, Dr. Ninfa Linden about the pain. ?

## 2021-05-19 NOTE — Telephone Encounter (Signed)
How quickly would she be able to see Dr. Marlou Sa? ?

## 2021-05-19 NOTE — ED Provider Notes (Signed)
?[facility] DEPT ?Provider Note ? ? ?CSN: [id number] ?Arrival date & time: 05/19/21  1708 ? ?  ? ?History ? ?Chief Complaint  ?Patient presents with  ? Shoulder Pain  ? Hypertension  ? ? ?[name] is a 66 y.o. female. ? ?HPI ?Complains of being out of narcotic medication, and having left shoulder pain.  She missed her appointment with her pain management doctor today because of "pain."  She has chronic left shoulder pain, from what she states is "a rotator cuff problem."  She came here today by EMS.  No recent trauma.  She does not currently have a sling at home. ? ?She has chronic pain and her daily MME is 75.  Last prescription 04/15/2021, was 430 days ?  ? ?Home Medications ?Prior to Admission medications   ?Medication Sig Start Date End Date Taking? Authorizing Provider  ?albuterol (PROAIR HFA) 108 (90 Base) MCG/ACT inhaler INHALE 2 PUFFS INTO THE LUNGS EVERY 4 HOURS AS NEEDED FOR WHEEZING ?Patient taking differently: Inhale 2 puffs into the lungs every 4 (four) hours as needed for shortness of breath or wheezing. 12/04/18   Rigoberto Noel, MD  ?albuterol (PROVENTIL) (2.5 MG/3ML) 0.083% nebulizer solution Take 3 mLs (2.5 mg total) by nebulization every 4 (four) hours as needed for wheezing or shortness of breath. 12/04/18   Rigoberto Noel, MD  ?ALPRAZolam Duanne Moron) 1 MG tablet Take 0.5 tablets (0.5 mg total) by mouth 2 (two) times daily as needed for anxiety. ?Patient taking differently: Take 1 mg by mouth 3 (three) times daily. 06/02/15   Elgergawy, Silver Huguenin, MD  ?baclofen (LIORESAL) 10 MG tablet Take 0.5-1 tablets (5-10 mg total) by mouth 3 (three) times daily as needed for muscle spasms. 03/29/20   Hilts, Legrand Como, MD  ?baclofen (LIORESAL) 20 MG tablet Take 20 mg by mouth at bedtime. 04/22/20   [provider]  ?budesonide-formoterol (SYMBICORT) 160-4.5 MCG/ACT inhaler Inhale 2 puffs into the lungs 2 (two) times daily. 12/04/18 07/18/20  Rigoberto Noel, MD  ?cefdinir (OMNICEF)  300 MG capsule Take 1 capsule (300 mg total) by mouth 2 (two) times daily. 04/25/21   Pattricia Boss, MD  ?cephALEXin (KEFLEX) 500 MG capsule Take 1 capsule (500 mg total) by mouth 3 (three) times daily. 07/29/20   Raspet, Derry Skill, PA-C  ?Cholecalciferol (VITAMIN D3) 50 MCG (2000 UT) TABS Take 2,000 Units by mouth daily.    [provider]  ?clotrimazole-betamethasone (LOTRISONE) cream Apply to affected area 2 times daily prn ?Patient taking differently: Apply 1 application topically 2 (two) times daily as needed (rash). Apply to affected area 2 times daily prn 04/29/20   Lajean Saver, MD  ?dicyclomine (BENTYL) 20 MG tablet Take 1 tablet (20 mg total) by mouth 2 (two) times daily. 07/18/20   Maudie Flakes, MD  ?docusate sodium (COLACE) 100 MG capsule Take 100 mg by mouth daily.    [provider]  ?entecavir (BARACLUDE) 0.5 MG tablet Take 0.5 mg by mouth every other day. 11/25/19   [provider]  ?famotidine (PEPCID) 20 MG tablet Take 20 mg by mouth 2 (two) times daily. 10/28/19   [provider]  ?gabapentin (NEURONTIN) 300 MG capsule Take 300 mg by mouth at bedtime. 07/13/20   [provider]  ?levocetirizine (XYZAL) 5 MG tablet Take 5 mg by mouth every evening. 12/02/19   [provider]  ?loperamide (IMODIUM) 2 MG capsule Take 1 capsule (2 mg total) by mouth 4 (four) times daily  as needed for diarrhea or loose stools. 07/18/20   Maudie Flakes, MD  ?magnesium hydroxide (MILK OF MAGNESIA) 400 MG/5ML suspension Take 30 mLs by mouth daily as needed for mild constipation.    [provider]  ?metoprolol tartrate (LOPRESSOR) 25 MG tablet Take 25 mg by mouth 2 (two) times daily. 04/22/20   [provider]  ?montelukast (SINGULAIR) 10 MG tablet Take 10 mg by mouth at bedtime.    [provider]  ?Multiple Vitamins-Minerals (MULTIVITAMIN WOMEN 50+) TABS Take 1 tablet by mouth daily.    [provider]  ?naloxone Karma Greaser) 4 MG/0.1ML LIQD  nasal spray kit Place 4 mg into the nose once as needed (for accidental overdose). 07/01/19   [provider]  ?ondansetron (ZOFRAN ODT) 4 MG disintegrating tablet Take 1 tablet (4 mg total) by mouth every 8 (eight) hours as needed for nausea or vomiting. 07/18/20   Maudie Flakes, MD  ?Oxycodone HCl 10 MG TABS Take 10 mg by mouth every 4 (four) hours as needed. Take 10 mg by mouth every four to six hours as needed for pain 10/19/18   [provider]  ?pantoprazole (PROTONIX) 40 MG tablet Take 40 mg by mouth 2 (two) times daily before a meal. 12/23/13   [provider]  ?predniSONE (DELTASONE) 10 MG tablet Take 2 tablets (20 mg total) by mouth 2 (two) times daily with a meal. 04/25/21   Pattricia Boss, MD  ?QUEtiapine (SEROQUEL) 300 MG tablet Take 300 mg by mouth at bedtime. 05/06/20   [provider]  ?rivaroxaban (XARELTO) 20 MG TABS tablet Take 20 mg by mouth daily.    [provider]  ?spironolactone (ALDACTONE) 50 MG tablet Take 50 mg by mouth daily. 04/13/20   [provider]  ?sucralfate (CARAFATE) 1 g tablet Take 1 g by mouth 4 (four) times daily. 07/22/19   [provider]  ?SYMPROIC 0.2 MG TABS Take 0.2 mg by mouth daily. 04/30/20   [provider]  ?enoxaparin (LOVENOX) 120 MG/0.8ML SOLN Inject 0.8 mLs (120 mg total) into the skin every 12 (twelve) hours. 11/30/10 04/16/11  Jenelle Mages, MD  ?loratadine (CLARITIN) 10 MG tablet Take 10 mg by mouth daily.    04/16/11  [provider]  ?   ? ?Allergies    ?Amitriptyline hcl, Azithromycin, Fentanyl, Gabapentin, Hydromorphone hcl, Cymbalta [duloxetine hcl], Dilaudid [hydromorphone], and Neomycin-bacitracin zn-polymyx   ? ?Review of Systems   ?Review of Systems ? ?Physical Exam ?Updated Vital Signs ?BP (!) 185/112   Pulse 78   Temp 98.2 ?F (36.8 ?C) (Oral)   Resp 20   Ht 5' 4"  (1.626 m)   Wt 104.3 kg   SpO2 100%   BMI 39.48 kg/m?  ?Physical Exam ?Vitals and nursing note  reviewed.  ?Constitutional:   ?   General: She is not in acute distress. ?   Appearance: She is well-developed. She is not ill-appearing, toxic-appearing or diaphoretic.  ?HENT:  ?   Head: Normocephalic and atraumatic.  ?   Right Ear: External ear normal.  ?   Left Ear: External ear normal.  ?Eyes:  ?   Conjunctiva/sclera: Conjunctivae normal.  ?   Pupils: Pupils are equal, round, and reactive to light.  ?Neck:  ?   Trachea: Phonation normal.  ?Cardiovascular:  ?   Rate and Rhythm: Normal rate.  ?Pulmonary:  ?   Effort: Pulmonary effort is normal.  ?Abdominal:  ?   General: There is no distension.  ?  Musculoskeletal:  ?   Cervical back: Normal range of motion and neck supple.  ?   Comments: She guards against move the left arm secondary to left shoulder pain.  There is no left shoulder deformity.  ?Skin: ?   General: Skin is warm and dry.  ?Neurological:  ?   Mental Status: She is alert and oriented to person, place, and time.  ?   Cranial Nerves: No cranial nerve deficit.  ?   Sensory: No sensory deficit.  ?   Motor: No abnormal muscle tone.  ?   Coordination: Coordination normal.  ?Psychiatric:     ?   Mood and Affect: Mood normal.     ?   Behavior: Behavior normal.     ?   Thought Content: Thought content normal.     ?   Judgment: Judgment normal.  ? ? ?ED Results / Procedures / Treatments   ?Labs ?(all labs ordered are listed, but only abnormal results are displayed) ?Labs Reviewed - No data to display ? ?EKG ?None ? ?Radiology ?No results found. ? ?Procedures ?Procedures  ? ? ?Medications Ordered in ED ?Medications  ?ondansetron (ZOFRAN-ODT) disintegrating tablet 8 mg (8 mg Oral Given 05/19/21 1844)  ?HYDROmorphone (DILAUDID) injection 2 mg (2 mg Intramuscular Given 05/19/21 1917)  ?oxyCODONE (Oxy IR/ROXICODONE) immediate release tablet 10 mg (10 mg Oral Given 05/19/21 2037)  ? ? ?ED Course/ Medical Decision Making/ A&P ?  ?                        ?Medical Decision Making ?Patient presenting with shoulder pain  after running out of chronic narcotic medication 3 days ago.  She missed her appointment today to get refills on prescription.  No apparent change in chronic condition.  Treated with pain medicine in the ED. ? ?Probl

## 2021-05-20 NOTE — Telephone Encounter (Signed)
I called and talked to pt. She went to the ED last night and she was given Diluadid. She is scheduled to see [facility] for pain management. She will cb  on Monday to discuss appt with Dr. Marlou Sa. Per pt. She has a lot going on right now. Thank you ?

## 2021-05-28 ENCOUNTER — Emergency Department (HOSPITAL_COMMUNITY): Payer: Medicare Other

## 2021-05-28 ENCOUNTER — Encounter (HOSPITAL_COMMUNITY): Payer: Self-pay

## 2021-05-28 ENCOUNTER — Emergency Department (HOSPITAL_COMMUNITY)
Admission: EM | Admit: 2021-05-28 | Discharge: 2021-05-28 | Disposition: A | Discharge status: Home / Self Care | Payer: Medicare Other | Attending: Emergency Medicine | Admitting: Emergency Medicine

## 2021-05-28 ENCOUNTER — Other Ambulatory Visit: Payer: Self-pay

## 2021-05-28 DIAGNOSIS — R4182 Altered mental status, unspecified: Secondary | ICD-10-CM

## 2021-05-28 DIAGNOSIS — J449 Chronic obstructive pulmonary disease, unspecified: Secondary | ICD-10-CM | POA: Diagnosis not present

## 2021-05-28 DIAGNOSIS — Z79899 Other long term (current) drug therapy: Secondary | ICD-10-CM | POA: Diagnosis not present

## 2021-05-28 DIAGNOSIS — I129 Hypertensive chronic kidney disease with stage 1 through stage 4 chronic kidney disease, or unspecified chronic kidney disease: Secondary | ICD-10-CM | POA: Insufficient documentation

## 2021-05-28 DIAGNOSIS — N183 Chronic kidney disease, stage 3 unspecified: Secondary | ICD-10-CM | POA: Insufficient documentation

## 2021-05-28 DIAGNOSIS — Z7901 Long term (current) use of anticoagulants: Secondary | ICD-10-CM | POA: Insufficient documentation

## 2021-05-28 DIAGNOSIS — R519 Headache, unspecified: Secondary | ICD-10-CM | POA: Insufficient documentation

## 2021-05-28 DIAGNOSIS — W19XXXA Unspecified fall, initial encounter: Secondary | ICD-10-CM | POA: Insufficient documentation

## 2021-05-28 LAB — CBC WITH DIFFERENTIAL/PLATELET
Abs Immature Granulocytes: 0.02 10*3/uL (ref 0.00–0.07)
Basophils Absolute: 0 10*3/uL (ref 0.0–0.1)
Basophils Relative: 0 %
Eosinophils Absolute: 0 10*3/uL (ref 0.0–0.5)
Eosinophils Relative: 0 %
HCT: 39.3 % (ref 36.0–46.0)
Hemoglobin: 12.7 g/dL (ref 12.0–15.0)
Immature Granulocytes: 0 %
Lymphocytes Relative: 14 %
Lymphs Abs: 0.9 10*3/uL (ref 0.7–4.0)
MCH: 28.4 pg (ref 26.0–34.0)
MCHC: 32.3 g/dL (ref 30.0–36.0)
MCV: 87.9 fL (ref 80.0–100.0)
Monocytes Absolute: 0.4 10*3/uL (ref 0.1–1.0)
Monocytes Relative: 7 %
Neutro Abs: 5 10*3/uL (ref 1.7–7.7)
Neutrophils Relative %: 79 %
Platelets: 162 10*3/uL (ref 150–400)
RBC: 4.47 MIL/uL (ref 3.87–5.11)
RDW: 14.3 % (ref 11.5–15.5)
WBC: 6.4 10*3/uL (ref 4.0–10.5)
nRBC: 0 % (ref 0.0–0.2)

## 2021-05-28 LAB — COMPREHENSIVE METABOLIC PANEL
ALT: 66 U/L — ABNORMAL HIGH (ref 0–44)
AST: 110 U/L — ABNORMAL HIGH (ref 15–41)
Albumin: 4.2 g/dL (ref 3.5–5.0)
Alkaline Phosphatase: 105 U/L (ref 38–126)
Anion gap: 15 (ref 5–15)
BUN: 14 mg/dL (ref 8–23)
CO2: 19 mmol/L — ABNORMAL LOW (ref 22–32)
Calcium: 10.1 mg/dL (ref 8.9–10.3)
Chloride: 98 mmol/L (ref 98–111)
Creatinine, Ser: 1.39 mg/dL — ABNORMAL HIGH (ref 0.44–1.00)
GFR, Estimated: 42 mL/min — ABNORMAL LOW (ref >60)
Glucose, Bld: 92 mg/dL (ref 70–99)
Potassium: 3.7 mmol/L (ref 3.5–5.1)
Sodium: 132 mmol/L — ABNORMAL LOW (ref 135–145)
Total Bilirubin: 1.4 mg/dL — ABNORMAL HIGH (ref 0.3–1.2)
Total Protein: 7 g/dL (ref 6.5–8.1)

## 2021-05-28 LAB — URINALYSIS, ROUTINE W REFLEX MICROSCOPIC
Bilirubin Urine: NEGATIVE
Glucose, UA: NEGATIVE mg/dL
Ketones, ur: 5 mg/dL — AB
Leukocytes,Ua: NEGATIVE
Nitrite: NEGATIVE
Protein, ur: NEGATIVE mg/dL
Specific Gravity, Urine: 1.002 — ABNORMAL LOW (ref 1.005–1.030)
pH: 6 (ref 5.0–8.0)

## 2021-05-28 LAB — CBG MONITORING, ED: Glucose-Capillary: 97 mg/dL (ref 70–99)

## 2021-05-28 LAB — PROTIME-INR
INR: 1.3 — ABNORMAL HIGH (ref 0.8–1.2)
Prothrombin Time: 16.2 seconds — ABNORMAL HIGH (ref 11.4–15.2)

## 2021-05-28 LAB — AMMONIA: Ammonia: 22 umol/L (ref 9–35)

## 2021-05-28 NOTE — ED Notes (Signed)
Pt asking for an MRI of her shoulder and her kidneys. EDP notified of the pt's complaints and pt was educated that MRI orders were up to the EDP based on their assessments. PT able move all extremities equally and well,  ?
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
## 2021-05-28 NOTE — ED Notes (Signed)
EDP notified that the pt is complaining of hitting her head however there appears to be not head trauma noted and pt seems unsure if she actually hit her head. VSS  ?

## 2021-05-28 NOTE — ED Notes (Signed)
Pt's daughter given an update ?

## 2021-05-28 NOTE — ED Triage Notes (Signed)
Pt BIB EMS fpr a fall 2.5 hours ago in the shower. Pt states she hit her head but there are no evidence of head trauma. C/O generalized, stating he wants a MRI of her kidneys and to see if she has a hernia.  ? ? ?Pt states she is on Xarelto  ? ?VSS ? ?

## 2021-05-28 NOTE — Discharge Instructions (Addendum)
To the daughter Jaretzy Lhommedieu): Please manage patient's medications.  She is on multiple drugs that can alter someone's mental status; the 2 that are most concerning when combined or Xanax and oxycodone.  She should not be taking both of these at the same time, but should only be taking 1 of these.  Please talk to your primary care physician about dropping either the Xanax or the oxycodone.  I encourage you to stop giving her 1 of these medications until you can get the problem sorted out with the primary care physician.  ?Patient's blood pressure was elevated throughout hospital visit.  Please visit PCP for further blood pressure management. ?Please do not hesitate to return to the emergency department if the worrisome signs and symptoms we discussed become apparent. ?

## 2021-05-28 NOTE — ED Notes (Signed)
Pt stated she felt like she needed to be in a crazy hospital. After speaking with the pt she denies  actually feeling this way and was frustrated that she could  not get her phone to work. Denies SI, HI and hallucinations  ?

## 2021-05-28 NOTE — ED Notes (Signed)
Pt's daugther met with and explained pt's discharge papers. Daughter expressed understanding and pt was safely put in the car.  ?

## 2021-05-28 NOTE — ED Notes (Signed)
Pt able to ambulate 110f from the bed to the toilet without assistance. Staff there for support in case the pt needs assistance ?

## 2021-05-30 ENCOUNTER — Encounter: Payer: Self-pay | Admitting: Orthopaedic Surgery

## 2021-05-30 ENCOUNTER — Ambulatory Visit (INDEPENDENT_AMBULATORY_CARE_PROVIDER_SITE_OTHER): Payer: Medicare Other | Admitting: Orthopaedic Surgery

## 2021-05-30 DIAGNOSIS — G8929 Other chronic pain: Secondary | ICD-10-CM | POA: Diagnosis not present

## 2021-05-30 DIAGNOSIS — M25512 Pain in left shoulder: Secondary | ICD-10-CM | POA: Diagnosis not present

## 2021-05-30 LAB — URINE CULTURE: Culture: <10000 — AB

## 2021-05-30 MED ORDER — PREDNISONE 50 MG PO TABS
ORAL_TABLET | ORAL | 0 refills | Status: DC
Start: 2021-05-30 — End: 2022-06-25

## 2021-05-30 NOTE — Progress Notes (Signed)
The patient comes in today requesting a steroid injection her left shoulder.  She has known arthritis in that shoulder and retracted rotator cuff tear.  However, we just injected her shoulder 6 weeks ago.  Since then she did have a mechanical fall.  I did review x-rays from just a few weeks ago from her left shoulder that was done in the emergency room.  There is no evidence of fracture. ? ?Her shoulder does move well but is painful.  She cannot take anti-inflammatories since she is on blood thinning medication.  She is not a diabetic she states. ? ?She understands that it is not safe to place a steroid injection in her left shoulder today.  I recommended 5 days of prednisone 50 mg orally as well as over-the-counter Voltaren gel.  We can certainly see her back in 6 weeks and safely inject her shoulder then if needed with a steroid. ?

## 2021-06-29 ENCOUNTER — Telehealth: Payer: Self-pay | Admitting: Orthopaedic Surgery

## 2021-06-29 NOTE — Telephone Encounter (Signed)
Pt called and was wondering if she can she get a Orthovisc injection in her right knee. Also she is wanting to get an injection in her right shoulder. She would like to do both injections in the same day if possible. Can we try to get her approved by 06/26?   Cb [phone]

## 2021-06-29 NOTE — Telephone Encounter (Signed)
Talked with patient and advised her that next available gel injection would need to be after 10/14/2021 due to having gel injection in right knee on 04/13/2021.  Patient voiced that she understands.

## 2021-07-11 ENCOUNTER — Telehealth: Payer: Self-pay | Admitting: Orthopaedic Surgery

## 2021-07-11 NOTE — Telephone Encounter (Signed)
Pt called requesting a call back first thing tomorrow morning. Pt states she is in severe pain and need a call back. Pt phone number is [phone].

## 2021-07-12 NOTE — Telephone Encounter (Signed)
LMOM for patient letting her know I was returning her call

## 2021-07-18 ENCOUNTER — Ambulatory Visit (INDEPENDENT_AMBULATORY_CARE_PROVIDER_SITE_OTHER): Payer: Medicare Other | Admitting: Orthopaedic Surgery

## 2021-07-18 ENCOUNTER — Encounter: Payer: Self-pay | Admitting: Orthopaedic Surgery

## 2021-07-18 ENCOUNTER — Ambulatory Visit (INDEPENDENT_AMBULATORY_CARE_PROVIDER_SITE_OTHER): Payer: Medicare Other

## 2021-07-18 DIAGNOSIS — G8929 Other chronic pain: Secondary | ICD-10-CM | POA: Diagnosis not present

## 2021-07-18 DIAGNOSIS — M25512 Pain in left shoulder: Secondary | ICD-10-CM

## 2021-07-18 MED ORDER — METHYLPREDNISOLONE ACETATE 40 MG/ML IJ SUSP
40.0000 mg | INTRAMUSCULAR | Status: AC | PRN
  Administered 2021-07-18: 40 mg via INTRA_ARTICULAR

## 2021-07-18 MED ORDER — LIDOCAINE HCL 1 % IJ SOLN
3.0000 mL | INTRAMUSCULAR | Status: AC | PRN
  Administered 2021-07-18: 3 mL

## 2021-08-08 ENCOUNTER — Telehealth: Payer: Self-pay | Admitting: Orthopaedic Surgery

## 2021-08-08 ENCOUNTER — Other Ambulatory Visit: Payer: Self-pay | Admitting: Orthopaedic Surgery

## 2021-08-08 MED ORDER — ACETAMINOPHEN-CODEINE 300-30 MG PO TABS: 1.0000 | ORAL_TABLET | Freq: Three times a day (TID) | ORAL | 0 refills | Status: DC | PRN

## 2021-08-08 NOTE — Telephone Encounter (Signed)
Please advise 

## 2021-08-08 NOTE — Telephone Encounter (Signed)
Patient called asked if she can get something for pain called into her pharmacy. Patient said she is in so much pain. Patient uses [facility]. Patient said she ran out of pain medicine a couple days ago. The number to contact patient is [phone]

## 2021-08-08 NOTE — Telephone Encounter (Signed)
Pt was informed. She stated she is in a lot of pain and wanted to see if she can be worked in sooner to discuss surgery. Does have appts on 7/24 and 31st and needs 3 days to set up transportation.

## 2021-08-08 NOTE — Telephone Encounter (Signed)
IC advised that unfortunately do not have anything sooner. She will keep scheduled appt with Dr Marlou Sa

## 2021-08-10 ENCOUNTER — Other Ambulatory Visit: Payer: Self-pay | Admitting: Orthopaedic Surgery

## 2021-08-10 ENCOUNTER — Telehealth: Payer: Self-pay | Admitting: Orthopedic Surgery

## 2021-08-10 ENCOUNTER — Telehealth: Payer: Self-pay | Admitting: Orthopaedic Surgery

## 2021-08-10 MED ORDER — TRAMADOL HCL 50 MG PO TABS
50.0000 mg | ORAL_TABLET | Freq: Four times a day (QID) | ORAL | 0 refills | Status: DC | PRN
Start: 2021-08-10 — End: 2022-06-25

## 2021-08-10 NOTE — Telephone Encounter (Signed)
Ok thx.

## 2021-08-10 NOTE — Telephone Encounter (Signed)
Patient called advised due to her medical history she can not take Tylenol with Codeine. Patient said she is not going to take the medication. Patient said she is going back to the pain clinic. The number to contact patient if needed is [phone]

## 2021-08-10 NOTE — Telephone Encounter (Signed)
Patient was prescribed tylenol but is unable to take it because she has cirrhosis of her liver and some digestion problems and would like to know if she could be prescribed something else instead. She's in a lot of pain. CB # [phone]

## 2021-08-10 NOTE — Telephone Encounter (Signed)
Please advise 

## 2021-08-15 ENCOUNTER — Ambulatory Visit: Payer: Medicare Other | Admitting: Orthopedic Surgery

## 2021-08-24 ENCOUNTER — Encounter: Payer: Self-pay | Admitting: Orthopedic Surgery

## 2021-08-24 ENCOUNTER — Ambulatory Visit (INDEPENDENT_AMBULATORY_CARE_PROVIDER_SITE_OTHER): Payer: Medicare Other | Admitting: Surgical

## 2021-08-24 ENCOUNTER — Ambulatory Visit (INDEPENDENT_AMBULATORY_CARE_PROVIDER_SITE_OTHER): Payer: Medicare Other

## 2021-08-24 DIAGNOSIS — M79602 Pain in left arm: Secondary | ICD-10-CM

## 2021-08-24 DIAGNOSIS — M12812 Other specific arthropathies, not elsewhere classified, left shoulder: Secondary | ICD-10-CM

## 2021-08-24 NOTE — Progress Notes (Signed)
Office Visit Note   Patient: [name]           Date of Birth: [birth year]           MRN: [id number] Visit Date: 08/24/2021 Requested by: Bernerd Limbo, MD [address] [place],  [place] [id number] PCP: Bernerd Limbo, MD  Subjective: Chief Complaint  Patient presents with   Left Shoulder - Pain    HPI: [name] is a 66 y.o. female who presents to the office complaining of left shoulder pain.  Patient has history of left shoulder arthritis and rotator cuff arthropathy.  She has been seeing Dr. Ninfa Linden.  She has history of prior left shoulder MRI scan with supraspinatus tear that is retracted to the glenohumeral joint.  She describes anterolateral pain with difficulty raising her arm and difficulty sleeping on her left side.  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  Recent injection by Dr. Ninfa Linden on 07/18/2021 which gave her some relief but has mostly worn off at this point.  She has had several injections over the last several years but would like definitive management of her left shoulder pain.  No history of prior shoulder surgery or dislocation.  She does state that she is in pain management and is switching pain management physicians.  She takes oxycodone 10 mg twice daily.  She cannot take Tylenol due to history of cirrhosis.  She does have history of DVT/PE and takes Xarelto which is managed by her PCP.  She does have a cardiologist at [facility] and reports a normal stress test with prior echocardiogram in 2017 demonstrating grade 1 diastolic dysfunction and pericardial effusion mildly.  Good ejection fraction at that time.  No history of CKD, stroke, diabetes.  She does occasionally vape but does not smoke.  She lives with her daughter and 5 grandchildren who range from 75 years old to 30 years old.              ROS: All systems reviewed are negative as they relate to the chief complaint within the history of present  illness.  Patient denies fevers or chills.  Assessment & Plan: Visit Diagnoses:  1. Rotator cuff arthropathy of left shoulder   2. Left arm pain     Plan: Patient is a 66 year old female who presents for evaluation of left shoulder pain.  She has history of rotator cuff arthropathy with retracted supraspinatus tear.  Had recent injury 2 weeks ago where she tripped and hit the front of her left arm into the door frame but did not actually fall to the ground.  She has had mildly increased pain since that event but no loss of function of her arm.  Does have weakness with supra and infra on exam today.  Axillary nerve is intact with deltoid firing.  Radiographs of the left humerus taken today demonstrate no new injury compared with prior radiographs of the left shoulder.  After discussion of options, patient would like to proceed with left shoulder reverse shoulder arthroplasty.  Discussed the risks and benefits of the procedure including but not limited to the risk of nerve/blood vessel damage, shoulder stiffness, shoulder instability, intraoperative fracture, revision surgery, prosthetic joint infection, medical complication from surgery such as DVT/PE/stroke/MI/death.  Discussed the recovery timeframe.  After discussion of options and surgery, she would like to proceed.  Plan to order CT thin cut of the left shoulder for preoperative planning  purposes.  Patient was discussed with Dr. Marlou Sa today and she will meet him upon her return to review CT scan.  She will need to come off of her Xarelto and need risk stratification by her cardiologist and PCP.  She prefers surgery on a Monday as her daughter has that they often will be able to spend time with her in the hospital. Last injection was 6/26 so soonest surgical date would be late September  Follow-Up Instructions: No follow-ups on file.   Orders:  Orders Placed This Encounter  Procedures   XR Humerus Left   CT SHOULDER LEFT WO CONTRAST   No orders  of the defined types were placed in this encounter.     Procedures: No procedures performed   Clinical Data: No additional findings.  Objective: Vital Signs: There were no vitals taken for this visit.  Physical Exam:  Constitutional: Patient appears well-developed HEENT:  Head: Normocephalic Eyes:EOM are normal Neck: Normal range of motion Cardiovascular: Normal rate Pulmonary/chest: Effort normal Neurologic: Patient is alert Skin: Skin is warm Psychiatric: Patient has normal mood and affect  Ortho Exam: Ortho exam demonstrates left shoulder with 35 degrees external rotation, 75 degrees abduction, 140 degrees forward flexion.  Axillary nerve intact with deltoid firing.  Weakness of supraspinatus and infraspinatus of the left shoulder relative to the right.  She has good subscapularis strength rated 5/5.  Right shoulder with range of motion 45 degrees external rotation, 95 degrees abduction, 160 degrees forward flexion.  Positive external rotation lag sign of the left shoulder.  She has coarseness and grinding throughout the left shoulder consistent with arthritis.  5/5 motor strength of bilateral grip strength, finger abduction, pronation/supination, bicep, tricep, deltoid.  2+ radial pulse of the left upper extremity.Faint ecchymosis noted throughout the anterior aspect of the humerus.  Specialty Comments:  No specialty comments available.  Imaging: No results found.   PMFS History: Patient Active Problem List   Diagnosis Date Noted   Severe sepsis with acute organ dysfunction ([place]) 01/04/2020   Primary osteoarthritis of right knee 12/04/2019   Primary osteoarthritis of left knee 12/04/2019   Vitamin D deficiency 09/20/2019   Cellulitis of foot 09/18/2019   Aspiration pneumonia of both lower lobes due to gastric secretions ([place]) 06/02/2019   Lumbar disc herniation 05/04/2019   Encounter for colorectal cancer screening 04/19/2019   Acute respiratory distress 04/06/2019    Hyperglycemia 04/06/2019   Pleural effusion on right 04/06/2019   Benzodiazepine dependence ([place]) 02/16/2019   Cervical stenosis of spinal canal 02/15/2019   Streptococcal sepsis ([place]) 01/22/2019   Acute bronchitis due to Rhinovirus 11/06/2018   Lung collapse 11/01/2018   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 08/13/2018   Compression fracture of body of thoracic vertebra (HCC) 08/01/2018   Closed nondisplaced fracture of greater tuberosity of right humerus 07/08/2018   On rivaroxaban therapy 07/08/2018   Chronic anticoagulation 05/27/2018   Falls frequently 05/27/2018   History of DVT (deep vein thrombosis) 03/16/2018   History of pulmonary embolism 03/10/2018   Left lower quadrant abdominal pain 03/10/2018   Chronic pain syndrome 03/05/2018   COPD exacerbation ([place]) 06/09/2016   Abnormal urine odor 09/26/2015   Recurrent pulmonary embolism ([place]) 06/26/2015   Lupus anticoagulant positive 06/26/2015   Thrombocytopenia ([place]) 05/31/2015   UTI (lower urinary tract infection) 05/26/2015   Right flank pain    Patchy loss of hair 03/08/2015   Primary osteoarthritis of both knees 02/25/2015   Incarcerated incisional hernia s/p lap  repair w mesh 12/10/2014 12/10/2014   DVT (deep venous thrombosis) ([place]) 10/31/2013   Pulmonary embolism ([place]) 10/26/2013   Kidney stone 10/04/2013   Tobacco abuse 10/04/2013   COPD (chronic obstructive pulmonary disease) ([place])    Unspecified constipation 12/23/2012   CKD (chronic kidney disease), stage III ([place]) 12/21/2012   Allergic rhinitis 06/29/2012   Chronic low back pain 06/29/2012   Dysuria 06/29/2012   Edema 06/29/2012   Gastro-esophageal reflux disease without esophagitis 06/29/2012   Hemorrhoids 06/29/2012   Hirsutism 06/29/2012   Insomnia 06/29/2012   Localized superficial swelling, mass, or lump 06/29/2012   Urinary incontinence 06/29/2012   Skin sensation disturbance 06/29/2012   Shortness of breath  06/29/2012   Pulmonary embolism and infarction ([place]) 06/29/2012   Proteinuria 06/29/2012   Nondependent cannabis abuse 06/29/2012   Acquired cyst of kidney 10/24/2011   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   Cocaine abuse ([place]) 11/27/2010   Depressive disorder 09/23/2009   Ascites 09/23/2009   Obesity 09/01/2008   Hepatitis B virus infection 08/31/2008   Acute hepatitis C virus infection 08/31/2008   Iron deficiency anemia 08/31/2008   Anxiety state 08/31/2008   Essential hypertension 08/31/2008   Coronary atherosclerosis 08/31/2008   Hepatic cirrhosis ([place]) 08/31/2008   Past Medical History:  Diagnosis Date   Agoraphobia with panic attacks    Altered mental state 10/26/2013   Ankle fracture, left 11/27/2010   S/p ORIF 11/2    Anxiety    Arthritis    "knees; lower back" (10/30/2013)   Bipolar disorder ([place])    Cavitary pneumonia    Cirrhosis ([place])    CKD (chronic kidney disease), stage III ([place])    COPD (chronic obstructive pulmonary disease) ([place])    Depression    DVT (deep venous thrombosis) ([place]) 09/2013   RLE   GERD (gastroesophageal reflux disease)    Heart murmur    Hepatitis B    History of blood transfusion    "due to excessive blood loss before hysterectomy"   History of urinary tract infection    Hypertension    currently on no medication    Kidney stones    Pneumonia    "4 times in the past year" (10/30/2013)   Pulmonary emboli ([place]) 09/2013   Shortness of breath dyspnea    increased exertion;exercise   Urinary frequency    Urinary incontinence     Family History  Problem Relation Age of Onset   Stroke Mother    Lung cancer Father     Past Surgical History:  Procedure Laterality Date   ABDOMINAL HYSTERECTOMY     ANKLE FRACTURE SURGERY Left    CARPAL TUNNEL RELEASE Left    DILATION AND CURETTAGE OF UTERUS  X 2   FRACTURE SURGERY     HAND SURGERY     1980's/left hand    INSERTION OF MESH N/A 12/10/2014   Procedure: INSERTION OF MESH;  Surgeon:  Michael Boston, MD;  Location: [facility];  Service: General;  Laterality: N/A;   LAPAROSCOPIC ASSISTED VENTRAL HERNIA REPAIR N/A 12/10/2014   Procedure: LAPAROSCOPIC ASSISTED REPAIR OF INCARCERATED UMBILICAL HERNIA ;  Surgeon: Michael Boston, MD;  Location: [facility];  Service: General;  Laterality: N/A;   TUBAL LIGATION     Social History   Occupational History   Not on file  Tobacco Use   Smoking status: Former    Packs/day: 0.50    Years: 35.00    Total pack years: 17.50    Types: Cigarettes  Quit date: 10/26/2013    Years since quitting: 7.8   Smokeless tobacco: Never  Vaping Use   Vaping Use: Some days   Substances: Nicotine, Flavoring  Substance and Sexual Activity   Alcohol use: No    Alcohol/week: 0.0 standard drinks of alcohol    Comment: "stopped drinking  in 2004"   Drug use: No    Types: "Crack" cocaine, Marijuana    Comment: 10/30/2013 "last crack in 2014; last marijuana in ~ 1990" drug free for last 3 years    Sexual activity: Never

## 2021-09-05 ENCOUNTER — Ambulatory Visit: Payer: Medicare Other | Admitting: Orthopedic Surgery

## 2021-09-22 ENCOUNTER — Ambulatory Visit
Admission: RE | Admit: 2021-09-22 | Discharge: 2021-09-22 | Disposition: A | Discharge status: Home / Self Care | Payer: Medicare Other | Source: Ambulatory Visit | Attending: Orthopedic Surgery | Admitting: Orthopedic Surgery

## 2021-09-22 DIAGNOSIS — M79602 Pain in left arm: Secondary | ICD-10-CM

## 2021-09-27 ENCOUNTER — Telehealth: Payer: Self-pay

## 2021-09-27 NOTE — Telephone Encounter (Signed)
Called and lm on vm for pt to advise open appt time for next wed 10/05/21 at 2:30 to call and advise if she can make that appt time.

## 2021-09-27 NOTE — Telephone Encounter (Signed)
-----   Message from Meredith Pel, MD sent at 09/27/2021  1:35 PM EDT ----- Pls schedule f/u thx

## 2021-09-27 NOTE — Progress Notes (Signed)
Pls schedule f/u thx

## 2021-10-12 ENCOUNTER — Telehealth: Payer: Self-pay | Admitting: Orthopedic Surgery

## 2021-10-12 NOTE — Telephone Encounter (Signed)
Patient called asked if she can get the Orthovisc injection  in her right knee when she comes to her appointment 09/27/223. The number to contact patient is [phone]

## 2021-10-12 NOTE — Telephone Encounter (Signed)
Talked with patient to clarify gel injection.   VOB submitted for SynviscOne, right knee

## 2021-10-18 ENCOUNTER — Telehealth: Payer: Self-pay | Admitting: Internal Medicine

## 2021-10-18 ENCOUNTER — Other Ambulatory Visit: Payer: Self-pay

## 2021-10-18 DIAGNOSIS — M1711 Unilateral primary osteoarthritis, right knee: Secondary | ICD-10-CM

## 2021-10-18 NOTE — Telephone Encounter (Signed)
Good Morning Dr.Dorsey,  Supervising MD 7/13 AM  We received a referral for this patient to transfer her care from [facility] to [place]. Patient is requesting this transfer as she lives in [place] and the drive to [place] is too far.  We have records for review, please advise on scheduling. Thank you.

## 2021-10-19 ENCOUNTER — Ambulatory Visit (INDEPENDENT_AMBULATORY_CARE_PROVIDER_SITE_OTHER): Payer: Medicare Other | Admitting: Orthopedic Surgery

## 2021-10-19 DIAGNOSIS — M12812 Other specific arthropathies, not elsewhere classified, left shoulder: Secondary | ICD-10-CM | POA: Diagnosis not present

## 2021-10-19 NOTE — Telephone Encounter (Signed)
Patient has been made aware of Dr.Dorseys recommendation that she would be better served at [facility] in [place].

## 2021-10-20 ENCOUNTER — Encounter: Payer: Self-pay | Admitting: Orthopedic Surgery

## 2021-10-20 NOTE — Progress Notes (Signed)
Office Visit Note   Patient: [name]           Date of Birth: 05-Jan-1956           MRN: [id number] Visit Date: 10/19/2021 Requested by: Bernerd Limbo, MD [facility] [place],  [place] [id number] PCP: Bernerd Limbo, MD  Subjective: Chief Complaint  Patient presents with   Right Knee - Pain    Wants synvisc one injection   Other     Left shoulder review scan    HPI: [name] is a 66 year old patient with left shoulder rotator cuff arthropathy.  Reports pain mornings loss of function.  Left activity she does the better.  Her neighbor here as well as closed.  Rainy weather is painful.  No prior surgery on the left shoulder.  Her daughter is in town and helps to care for her.  Hard for her to get comfortable at night.  She has much more pain in any functional problem.  She has tried taking dry medication and tramadol in the past which have not helped.  She does use a cane for history of knee arthritis.  Does report having some falls.              ROS: All systems reviewed are negative as they relate to the chief complaint within the history of present illness.  Patient denies  fevers or chills.   Assessment & Plan: Visit Diagnoses:  1. Rotator cuff arthropathy of left shoulder     Plan: Impression is end-stage rotator cuff arthropathy of the shoulder with maintained forward flexion and abduction above 90 degrees.  Significant pain is present however.  Discussed the risk and benefits of left shoulder reverse shoulder replacement using models and the rationale for patient specific instrumentation.  She wants to consider her options.  CT scan does show adequate glenoid vault for reconstruction.  Risks and benefits of the procedure discussed.  All questions answered.  Debbie's card is provided.  Follow-up as needed.  Follow-Up Instructions: No follow-ups on file.   Orders:  No orders of the defined types were placed in this encounter.  No orders of the defined types  were placed in this encounter.     Procedures: No procedures performed   Clinical Data: No additional findings.  Objective: Vital Signs: There were no vitals taken for this visit.  Physical Exam:   Constitutional: Patient appears well-developed HEENT:  Head: Normocephalic Eyes:EOM are normal Neck: Normal range of motion Cardiovascular: Normal rate Pulmonary/chest: Effort normal Neurologic: Patient is alert Skin: Skin is warm Psychiatric: Patient has normal mood and affect   Ortho Exam: Ortho exam demonstrates range of motion on the left at 35/95/160 passive range of motion on the right of 70/110/175 passive.  Deltoid is functional on the left.  Does have predictable weakness with external rotation strength testing.  No Popeye deformity.  Motor or sensory function to the hand is intact.  Specialty Comments:  No specialty comments available.  Imaging: No results found.   PMFS History: Patient Active Problem List   Diagnosis Date Noted   Severe sepsis with acute organ dysfunction ([place]) 01/04/2020   Primary osteoarthritis of right knee 12/04/2019   Primary osteoarthritis of left knee 12/04/2019   Vitamin D deficiency 09/20/2019   Cellulitis of foot 09/18/2019   Aspiration pneumonia of both lower lobes due to gastric secretions ([place]) 06/02/2019   Lumbar disc herniation 05/04/2019   Encounter for colorectal cancer screening 04/19/2019  Acute respiratory distress 04/06/2019   Hyperglycemia 04/06/2019   Pleural effusion on right 04/06/2019   Benzodiazepine dependence ([place]) 02/16/2019   Cervical stenosis of spinal canal 02/15/2019   Streptococcal sepsis ([place]) 01/22/2019   Acute bronchitis due to Rhinovirus 11/06/2018   Lung collapse 11/01/2018   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 08/13/2018   Compression fracture of body of thoracic vertebra (HCC) 08/01/2018   Closed nondisplaced fracture of greater tuberosity of right humerus  07/08/2018   On rivaroxaban therapy 07/08/2018   Chronic anticoagulation 05/27/2018   Falls frequently 05/27/2018   History of DVT (deep vein thrombosis) 03/16/2018   History of pulmonary embolism 03/10/2018   Left lower quadrant abdominal pain 03/10/2018   Chronic pain syndrome 03/05/2018   COPD exacerbation ([place]) 06/09/2016   Abnormal urine odor 09/26/2015   Recurrent pulmonary embolism ([place]) 06/26/2015   Lupus anticoagulant positive 06/26/2015   Thrombocytopenia ([place]) 05/31/2015   UTI (lower urinary tract infection) 05/26/2015   Right flank pain    Patchy loss of hair 03/08/2015   Primary osteoarthritis of both knees 02/25/2015   Incarcerated incisional hernia s/p lap repair w mesh 12/10/2014 12/10/2014   DVT (deep venous thrombosis) ([place]) 10/31/2013   Pulmonary embolism ([place]) 10/26/2013   Kidney stone 10/04/2013   Tobacco abuse 10/04/2013   COPD (chronic obstructive pulmonary disease) ([place])    Unspecified constipation 12/23/2012   CKD (chronic kidney disease), stage III ([place]) 12/21/2012   Allergic rhinitis 06/29/2012   Chronic low back pain 06/29/2012   Dysuria 06/29/2012   Edema 06/29/2012   Gastro-esophageal reflux disease without esophagitis 06/29/2012   Hemorrhoids 06/29/2012   Hirsutism 06/29/2012   Insomnia 06/29/2012   Localized superficial swelling, mass, or lump 06/29/2012   Urinary incontinence 06/29/2012   Skin sensation disturbance 06/29/2012   Shortness of breath 06/29/2012   Pulmonary embolism and infarction ([place]) 06/29/2012   Proteinuria 06/29/2012   Nondependent cannabis abuse 06/29/2012   Acquired cyst of kidney 10/24/2011   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   Cocaine abuse ([place]) 11/27/2010   Depressive disorder 09/23/2009   Ascites 09/23/2009   Obesity 09/01/2008   Hepatitis B virus infection 08/31/2008   Acute hepatitis C virus infection 08/31/2008   Iron deficiency anemia 08/31/2008   Anxiety state 08/31/2008   Essential hypertension  08/31/2008   Coronary atherosclerosis 08/31/2008   Hepatic cirrhosis ([place]) 08/31/2008   Past Medical History:  Diagnosis Date   Agoraphobia with panic attacks    Altered mental state 10/26/2013   Ankle fracture, left 11/27/2010   S/p ORIF 11/2    Anxiety    Arthritis    "knees; lower back" (10/30/2013)   Bipolar disorder ([place])    Cavitary pneumonia    Cirrhosis ([place])    CKD (chronic kidney disease), stage III ([place])    COPD (chronic obstructive pulmonary disease) ([place])    Depression    DVT (deep venous thrombosis) ([place]) 09/2013   RLE   GERD (gastroesophageal reflux disease)    Heart murmur    Hepatitis B    History of blood transfusion    "due to excessive blood loss before hysterectomy"   History of urinary tract infection    Hypertension    currently on no medication    Kidney stones    Pneumonia    "4 times in the past year" (10/30/2013)   Pulmonary emboli ([place]) 09/2013   Shortness of breath dyspnea    increased exertion;exercise   Urinary frequency  Urinary incontinence     Family History  Problem Relation Age of Onset   Stroke Mother    Lung cancer Father     Past Surgical History:  Procedure Laterality Date   ABDOMINAL HYSTERECTOMY     ANKLE FRACTURE SURGERY Left    CARPAL TUNNEL RELEASE Left    DILATION AND CURETTAGE OF UTERUS  X 2   FRACTURE SURGERY     HAND SURGERY     1980's/left hand    INSERTION OF MESH N/A 12/10/2014   Procedure: INSERTION OF MESH;  Surgeon: Michael Boston, MD;  Location: [facility];  Service: General;  Laterality: N/A;   LAPAROSCOPIC ASSISTED VENTRAL HERNIA REPAIR N/A 12/10/2014   Procedure: LAPAROSCOPIC ASSISTED REPAIR OF INCARCERATED UMBILICAL HERNIA ;  Surgeon: Michael Boston, MD;  Location: [facility];  Service: General;  Laterality: N/A;   TUBAL LIGATION     Social History   Occupational History   Not on file  Tobacco Use   Smoking status: Former    Packs/day: 0.50    Years: 35.00    Total pack years: 17.50    Types: Cigarettes     Quit date: 10/26/2013    Years since quitting: 7.9   Smokeless tobacco: Never  Vaping Use   Vaping Use: Some days   Substances: Nicotine, Flavoring  Substance and Sexual Activity   Alcohol use: No    Alcohol/week: 0.0 standard drinks of alcohol    Comment: "stopped drinking  in 2004"   Drug use: No    Types: "Crack" cocaine, Marijuana    Comment: 10/30/2013 "last crack in 2014; last marijuana in ~ 1990" drug free for last 3 years    Sexual activity: Never

## 2022-05-10 ENCOUNTER — Telehealth: Payer: Self-pay | Admitting: Surgical

## 2022-05-10 NOTE — Telephone Encounter (Signed)
Knee xr's from March 2023 show she has knee OA, she can be approved for gel injection and come back unless insurance requires appointment first

## 2022-05-10 NOTE — Telephone Encounter (Signed)
VOB submitted for Durolane, right knee.  

## 2022-05-10 NOTE — Telephone Encounter (Signed)
Please get auth for gel injections. I will call pt about brace.

## 2022-05-10 NOTE — Telephone Encounter (Signed)
Oh, okay for that from my standpoint

## 2022-05-10 NOTE — Telephone Encounter (Signed)
I called and talked to the pt. She stated she wants to wait on gel injection appt for the braces.

## 2022-05-10 NOTE — Telephone Encounter (Signed)
Noted.  Duplicate message. 

## 2022-05-10 NOTE — Telephone Encounter (Signed)
Patient wants to get gel shot in her right knee. Please advise. Patient also asking if she can get a knee brace for her right knee and a lower back brace.

## 2022-05-10 NOTE — Telephone Encounter (Signed)
I meant for the knee and back brace

## 2022-05-17 ENCOUNTER — Ambulatory Visit
Admission: EM | Admit: 2022-05-17 | Discharge: 2022-05-17 | Disposition: A | Discharge status: Home / Self Care | Payer: 59 | Attending: Physician Assistant | Admitting: Physician Assistant

## 2022-05-17 DIAGNOSIS — J069 Acute upper respiratory infection, unspecified: Secondary | ICD-10-CM | POA: Diagnosis not present

## 2022-05-17 DIAGNOSIS — R051 Acute cough: Secondary | ICD-10-CM

## 2022-05-17 DIAGNOSIS — J208 Acute bronchitis due to other specified organisms: Secondary | ICD-10-CM | POA: Diagnosis not present

## 2022-05-17 MED ORDER — PREDNISONE 10 MG PO TABS: 10.0000 mg | ORAL_TABLET | Freq: Three times a day (TID) | ORAL | 0 refills | Status: DC

## 2022-05-17 MED ORDER — DM-GUAIFENESIN ER 30-600 MG PO TB12: 1.0000 | ORAL_TABLET | Freq: Two times a day (BID) | ORAL | 0 refills | Status: DC

## 2022-05-17 MED ORDER — METHYLPREDNISOLONE ACETATE 80 MG/ML IJ SUSP
80.0000 mg | Freq: Once | INTRAMUSCULAR | Status: AC
  Administered 2022-05-17: 80 mg via INTRAMUSCULAR

## 2022-05-17 MED ORDER — AMOXICILLIN-POT CLAVULANATE 875-125 MG PO TABS: 1.0000 | ORAL_TABLET | Freq: Two times a day (BID) | ORAL | 0 refills | Status: DC

## 2022-05-17 NOTE — Discharge Instructions (Signed)
Advised take Augmentin 875 mg every 12 hours with food to help treat infection. Advised taking Mucinex DM every 12 hours to help loosen the chest congestion and to control cough. Advised take prednisone 10 mg every 8 hours to help reduce the wheezing and inflammatory response.  Advised to continue to use the albuterol nebulizer unit that you have at home, every 6 hours on a regular basis to help decrease wheezing and shortness of breath. Advised to get the Symbicort filled when possible and start using it on a regular basis as this will help prevent wheezing and shortness of breath from occurring.  Follow-up follow-up PCP return to urgent care as needed.

## 2022-05-17 NOTE — ED Triage Notes (Signed)
Pt c/o headache, cough, nasal congestion, "can't breathe real good,"   Onset ~ 1 week ago

## 2022-05-17 NOTE — ED Provider Notes (Signed)
[name]    CSN: [id number] Arrival date & time: 05/17/22  1528      History   Chief Complaint Chief Complaint  Patient presents with   Cough    HPI [name] is a 67 y.o. female.   67 year old female presents with cough, congestion and wheezing.  Patient indicates for the past week she has been having upper respiratory congestion with sinus congestion, bilateral ear congestion, rhinitis which is mainly been clear to yellow, postnasal drip, and intermittent sore throat.  Patient also indicates she has been having chest congestion with intermittent and progressive cough, wheezing, and shortness of breath.  Patient indicates that the cough is worse with activity and at night, production is thick and difficult to bring up.  Patient indicates she has a history of having COPD and asthma.  She indicates she has been using her albuterol nebulizer at home every 6 hours and also has been using her Symbicort inhaler but she ran out of this and cannot get it filled due to restriction.  She is without fever or chills, she is tolerating fluids well.   Cough Associated symptoms: rhinorrhea, shortness of breath and wheezing     Past Medical History:  Diagnosis Date   Agoraphobia with panic attacks    Altered mental state 10/26/2013   Ankle fracture, left 11/27/2010   S/p ORIF 11/2    Anxiety    Arthritis    "knees; lower back" (10/30/2013)   Bipolar disorder    Cavitary pneumonia    Cirrhosis    CKD (chronic kidney disease), stage III    COPD (chronic obstructive pulmonary disease)    Depression    DVT (deep venous thrombosis) 09/2013   RLE   GERD (gastroesophageal reflux disease)    Heart murmur    Hepatitis B    History of blood transfusion    "due to excessive blood loss before hysterectomy"   History of urinary tract infection    Hypertension    currently on no medication    Kidney stones    Pneumonia    "4 times in the past year" (10/30/2013)   Pulmonary  emboli 09/2013   Shortness of breath dyspnea    increased exertion;exercise   Urinary frequency    Urinary incontinence     Patient Active Problem List   Diagnosis Date Noted   Severe sepsis with acute organ dysfunction 01/04/2020   Primary osteoarthritis of right knee 12/04/2019   Primary osteoarthritis of left knee 12/04/2019   Vitamin D deficiency 09/20/2019   Cellulitis of foot 09/18/2019   Aspiration pneumonia of both lower lobes due to gastric secretions 06/02/2019   Lumbar disc herniation 05/04/2019   Encounter for colorectal cancer screening 04/19/2019   Acute respiratory distress 04/06/2019   Hyperglycemia 04/06/2019   Pleural effusion on right 04/06/2019   Benzodiazepine dependence 02/16/2019   Cervical stenosis of spinal canal 02/15/2019   Streptococcal sepsis 01/22/2019   Acute bronchitis due to Rhinovirus 11/06/2018   Lung collapse 11/01/2018   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 08/13/2018   Compression fracture of body of thoracic vertebra 08/01/2018   Closed nondisplaced fracture of greater tuberosity of right humerus 07/08/2018   On rivaroxaban therapy 07/08/2018   Chronic anticoagulation 05/27/2018   Falls frequently 05/27/2018   History of DVT (deep vein thrombosis) 03/16/2018   History of pulmonary embolism 03/10/2018   Left lower quadrant abdominal pain 03/10/2018   Chronic pain syndrome 03/05/2018  COPD exacerbation 06/09/2016   Abnormal urine odor 09/26/2015   Recurrent pulmonary embolism 06/26/2015   Lupus anticoagulant positive 06/26/2015   Thrombocytopenia 05/31/2015   UTI (lower urinary tract infection) 05/26/2015   Right flank pain    Patchy loss of hair 03/08/2015   Primary osteoarthritis of both knees 02/25/2015   Incarcerated incisional hernia s/p lap repair w mesh 12/10/2014 12/10/2014   DVT (deep venous thrombosis) (HCC) 10/31/2013   Pulmonary embolism (HCC) 10/26/2013   Kidney stone 10/04/2013    Tobacco abuse 10/04/2013   COPD (chronic obstructive pulmonary disease) (HCC)    Unspecified constipation 12/23/2012   CKD (chronic kidney disease), stage III 12/21/2012   Allergic rhinitis 06/29/2012   Chronic low back pain 06/29/2012   Dysuria 06/29/2012   Edema 06/29/2012   Gastro-esophageal reflux disease without esophagitis 06/29/2012   Hemorrhoids 06/29/2012   Hirsutism 06/29/2012   Insomnia 06/29/2012   Localized superficial swelling, mass, or lump 06/29/2012   Urinary incontinence 06/29/2012   Skin sensation disturbance 06/29/2012   Shortness of breath 06/29/2012   Pulmonary embolism and infarction 06/29/2012   Proteinuria 06/29/2012   Nondependent cannabis abuse 06/29/2012   Acquired cyst of kidney 10/24/2011   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   Cocaine abuse 11/27/2010   Depressive disorder 09/23/2009   Ascites 09/23/2009   Obesity 09/01/2008   Hepatitis B virus infection 08/31/2008   Acute hepatitis C virus infection 08/31/2008   Iron deficiency anemia 08/31/2008   Anxiety state 08/31/2008   Essential hypertension 08/31/2008   Coronary atherosclerosis 08/31/2008   Hepatic cirrhosis 08/31/2008    Past Surgical History:  Procedure Laterality Date   ABDOMINAL HYSTERECTOMY     ANKLE FRACTURE SURGERY Left    CARPAL TUNNEL RELEASE Left    DILATION AND CURETTAGE OF UTERUS  X 2   FRACTURE SURGERY     HAND SURGERY     1980's/left hand    INSERTION OF MESH N/A 12/10/2014   Procedure: INSERTION OF MESH;  Surgeon: Karie Soda, MD;  Location: [facility];  Service: General;  Laterality: N/A;   LAPAROSCOPIC ASSISTED VENTRAL HERNIA REPAIR N/A 12/10/2014   Procedure: LAPAROSCOPIC ASSISTED REPAIR OF INCARCERATED UMBILICAL HERNIA ;  Surgeon: Karie Soda, MD;  Location: [facility];  Service: General;  Laterality: N/A;   TUBAL LIGATION      OB History   No obstetric history on file.      Home Medications    Prior to Admission medications   Medication Sig Start Date  End Date Taking? Authorizing Provider  amoxicillin-clavulanate (AUGMENTIN) 875-125 MG tablet Take 1 tablet by mouth every 12 (twelve) hours. 05/17/22  Yes Ellsworth Lennox, PA-C  dextromethorphan-guaiFENesin [facility] DM) 30-600 MG 12hr tablet Take 1 tablet by mouth 2 (two) times daily. 05/17/22  Yes Ellsworth Lennox, PA-C  predniSONE (DELTASONE) 10 MG tablet Take 1 tablet (10 mg total) by mouth in the morning, at noon, and at bedtime. 05/17/22  Yes Ellsworth Lennox, PA-C  acetaminophen-codeine (TYLENOL #3) 300-30 MG tablet Take 1-2 tablets by mouth every 8 (eight) hours as needed for moderate pain. 08/08/21   Kathryne Hitch, MD  albuterol (PROAIR HFA) 108 (90 Base) MCG/ACT inhaler INHALE 2 PUFFS INTO THE LUNGS EVERY 4 HOURS AS NEEDED FOR WHEEZING Patient taking differently: Inhale 2 puffs into the lungs every 4 (four) hours as needed for shortness of breath or wheezing. 12/04/18   Oretha Milch, MD  albuterol (PROVENTIL) (2.5 MG/3ML) 0.083% nebulizer solution Take 3 mLs (2.5 mg total) by nebulization  every 4 (four) hours as needed for wheezing or shortness of breath. 12/04/18   Oretha Milch, MD  ALPRAZolam Prudy Feeler) 1 MG tablet Take 0.5 tablets (0.5 mg total) by mouth 2 (two) times daily as needed for anxiety. Patient taking differently: Take 1 mg by mouth 3 (three) times daily. 06/02/15   Elgergawy, Leana Roe, MD  baclofen (LIORESAL) 10 MG tablet Take 0.5-1 tablets (5-10 mg total) by mouth 3 (three) times daily as needed for muscle spasms. 03/29/20   Hilts, Casimiro Needle, MD  baclofen (LIORESAL) 20 MG tablet Take 20 mg by mouth at bedtime. 04/22/20   [provider]  budesonide-formoterol (SYMBICORT) 160-4.5 MCG/ACT inhaler Inhale 2 puffs into the lungs 2 (two) times daily. 12/04/18 07/18/20  Oretha Milch, MD  cefdinir (OMNICEF) 300 MG capsule Take 1 capsule (300 mg total) by mouth 2 (two) times daily. 04/25/21   Margarita Grizzle, MD  cephALEXin (KEFLEX) 500 MG capsule Take 1 capsule (500 mg total) by mouth 3  (three) times daily. 07/29/20   Raspet, Noberto Retort, PA-C  Cholecalciferol (VITAMIN D3) 50 MCG (2000 UT) TABS Take 2,000 Units by mouth daily.    [provider]  clotrimazole-betamethasone (LOTRISONE) cream Apply to affected area 2 times daily prn Patient taking differently: Apply 1 application topically 2 (two) times daily as needed (rash). Apply to affected area 2 times daily prn 04/29/20   Cathren Laine, MD  dicyclomine (BENTYL) 20 MG tablet Take 1 tablet (20 mg total) by mouth 2 (two) times daily. 07/18/20   Sabas Sous, MD  docusate sodium (COLACE) 100 MG capsule Take 100 mg by mouth daily.    [provider]  entecavir (BARACLUDE) 0.5 MG tablet Take 0.5 mg by mouth every other day. 11/25/19   [provider]  famotidine (PEPCID) 20 MG tablet Take 20 mg by mouth 2 (two) times daily. 10/28/19   [provider]  gabapentin (NEURONTIN) 300 MG capsule Take 300 mg by mouth at bedtime. 07/13/20   [provider]  levocetirizine (XYZAL) 5 MG tablet Take 5 mg by mouth every evening. 12/02/19   [provider]  loperamide (IMODIUM) 2 MG capsule Take 1 capsule (2 mg total) by mouth 4 (four) times daily as needed for diarrhea or loose stools. 07/18/20   Sabas Sous, MD  magnesium hydroxide (MILK OF MAGNESIA) 400 MG/5ML suspension Take 30 mLs by mouth daily as needed for mild constipation.    [provider]  metoprolol tartrate (LOPRESSOR) 25 MG tablet Take 25 mg by mouth 2 (two) times daily. 04/22/20   [provider]  montelukast (SINGULAIR) 10 MG tablet Take 10 mg by mouth at bedtime.    [provider]  Multiple Vitamins-Minerals (MULTIVITAMIN WOMEN 50+) TABS Take 1 tablet by mouth daily.    [provider]  naloxone [facility]) 4 MG/0.1ML LIQD nasal spray kit Place 4 mg into the nose once as needed (for accidental overdose). 07/01/19   [provider]  ondansetron (ZOFRAN ODT) 4 MG disintegrating tablet Take 1  tablet (4 mg total) by mouth every 8 (eight) hours as needed for nausea or vomiting. 07/18/20   Sabas Sous, MD  Oxycodone HCl 10 MG TABS Take 10 mg by mouth every 4 (four) hours as needed. Take 10 mg by mouth every four to six hours as needed for pain 10/19/18   [provider]  pantoprazole (PROTONIX) 40 MG tablet Take 40 mg by mouth 2 (two) times daily before a meal. 12/23/13  [provider]  predniSONE (DELTASONE) 50 MG tablet Take one tablet daily for 5 days. 05/30/21   Kathryne Hitch, MD  QUEtiapine (SEROQUEL) 300 MG tablet Take 300 mg by mouth at bedtime. 05/06/20   [provider]  rivaroxaban (XARELTO) 20 MG TABS tablet Take 20 mg by mouth daily.    [provider]  spironolactone (ALDACTONE) 50 MG tablet Take 50 mg by mouth daily. 04/13/20   [provider]  sucralfate (CARAFATE) 1 g tablet Take 1 g by mouth 4 (four) times daily. 07/22/19   [provider]  SYMPROIC 0.2 MG TABS Take 0.2 mg by mouth daily. 04/30/20   [provider]  traMADol (ULTRAM) 50 MG tablet Take 1-2 tablets (50-100 mg total) by mouth every 6 (six) hours as needed. 08/10/21   Kathryne Hitch, MD  enoxaparin (LOVENOX) 120 MG/0.8ML SOLN Inject 0.8 mLs (120 mg total) into the skin every 12 (twelve) hours. 11/30/10 04/16/11  Daryel Gerald, MD  loratadine (CLARITIN) 10 MG tablet Take 10 mg by mouth daily.    04/16/11  [provider]    Family History Family History  Problem Relation Age of Onset   Stroke Mother    Lung cancer Father     Social History Social History   Tobacco Use   Smoking status: Former    Packs/day: 0.50    Years: 35.00    Additional pack years: 0.00    Total pack years: 17.50    Types: Cigarettes    Quit date: 10/26/2013    Years since quitting: 8.5   Smokeless tobacco: Never  Vaping Use   Vaping Use: Some days   Substances: Nicotine, Flavoring  Substance Use Topics   Alcohol use: No     Alcohol/week: 0.0 standard drinks of alcohol    Comment: "stopped drinking  in 2004"   Drug use: No    Types: "Crack" cocaine, Marijuana    Comment: 10/30/2013 "last crack in 2014; last marijuana in ~ 1990" drug free for last 3 years      Allergies   Amitriptyline hcl, Azithromycin, Fentanyl, Gabapentin, Hydromorphone hcl, Cymbalta [duloxetine hcl], Dilaudid [hydromorphone], and Neomycin-bacitracin zn-polymyx   Review of Systems Review of Systems  HENT:  Positive for postnasal drip and rhinorrhea.   Respiratory:  Positive for cough, shortness of breath and wheezing.      Physical Exam Triage Vital Signs ED Triage Vitals  Enc Vitals Group     BP 05/17/22 1644 (!) 143/91     Pulse Rate 05/17/22 1644 67     Resp 05/17/22 1644 18     Temp 05/17/22 1644 (!) 97.2 F (36.2 C)     Temp Source 05/17/22 1644 Oral     SpO2 05/17/22 1644 97 %     Weight --      Height --      Head Circumference --      Peak Flow --      Pain Score 05/17/22 1642 4     Pain Loc --      Pain Edu? --      Excl. in GC? --    No data found.  Updated Vital Signs BP (!) 143/91 (BP Location: Right Arm)   Pulse 67   Temp (!) 97.2 F (36.2 C) (Oral)   Resp 18   SpO2 97%   Visual Acuity Right Eye Distance:   Left Eye Distance:   Bilateral Distance:    Right Eye Near:  Left Eye Near:    Bilateral Near:     Physical Exam Constitutional:      Appearance: Normal appearance.  HENT:     Right Ear: Ear canal normal. Tympanic membrane is injected.     Left Ear: Ear canal normal. Tympanic membrane is injected.     Mouth/Throat:     Mouth: Mucous membranes are moist.     Pharynx: Oropharynx is clear.  Cardiovascular:     Rate and Rhythm: Normal rate and regular rhythm.     Heart sounds: Normal heart sounds.  Pulmonary:     Effort: Pulmonary effort is normal.     Breath sounds: Normal air entry. Examination of the right-lower field reveals wheezing. Examination of the left-lower field reveals  wheezing. Wheezing (mild bilat) and rhonchi (moderate bilat) present. No rales.  Lymphadenopathy:     Cervical: No cervical adenopathy.  Neurological:     Mental Status: She is alert.      UC Treatments / Results  Labs (all labs ordered are listed, but only abnormal results are displayed) Labs Reviewed - No data to display  EKG   Radiology No results found.  Procedures Procedures (including critical care time)  Medications Ordered in UC Medications  methylPREDNISolone acetate (DEPO-MEDROL) injection 80 mg (has no administration in time range)    Initial Impression / Assessment and Plan / UC Course  I have reviewed the triage vital signs and the nursing notes.  Pertinent labs & imaging results that were available during my care of the patient were reviewed by me and considered in my medical decision making (see chart for details).    Plan: The diagnosis will be treated with the following: 1.  Acute upper respiratory tract infection: A.  Mucinex DM every 12 hours for congestion. 2.  Cough: A.  Mucinex DM every 12 hours for congestion. 3.  Acute bronchitis: A.  Depo-Medrol 80 mg given IM in the office today. B.  Prednisone 10 mg 3 times a day for 5 days only to help reduce acute inflammatory response. C.  Augmentin 875 mg every 12 hours with food to help treat infection. D.  Patient advised to continue use nebulizer unit at home every 6 hours to help reduce wheezing and shortness of breath. 4.  Patient advised to follow-up with her PCP at her appointment time which is next week for evaluation and treatment modification if needed. Final Clinical Impressions(s) / UC Diagnoses   Final diagnoses:  Acute upper respiratory infection  Acute cough  Acute bronchitis due to other specified organisms     Discharge Instructions      Advised take Augmentin 875 mg every 12 hours with food to help treat infection. Advised taking Mucinex DM every 12 hours to help loosen the  chest congestion and to control cough. Advised take prednisone 10 mg every 8 hours to help reduce the wheezing and inflammatory response.  Advised to continue to use the albuterol nebulizer unit that you have at home, every 6 hours on a regular basis to help decrease wheezing and shortness of breath. Advised to get the Symbicort filled when possible and start using it on a regular basis as this will help prevent wheezing and shortness of breath from occurring.  Follow-up follow-up PCP return to urgent care as needed.    ED Prescriptions     Medication Sig Dispense Auth. Provider   predniSONE (DELTASONE) 10 MG tablet Take 1 tablet (10 mg total) by mouth in the morning, at noon,  and at bedtime. 15 tablet Ellsworth Lennox, PA-C   amoxicillin-clavulanate (AUGMENTIN) 875-125 MG tablet Take 1 tablet by mouth every 12 (twelve) hours. 14 tablet Ellsworth Lennox, PA-C   dextromethorphan-guaiFENesin [facility] DM) 30-600 MG 12hr tablet Take 1 tablet by mouth 2 (two) times daily. 20 tablet Ellsworth Lennox, PA-C      PDMP not reviewed this encounter.   Ellsworth Lennox, PA-C 05/17/22 1705

## 2022-06-13 ENCOUNTER — Telehealth: Payer: Self-pay | Admitting: Surgical

## 2022-06-13 NOTE — Telephone Encounter (Signed)
Patient would like Gel Injection in right knee it was sent in 04/17 but she never got it, please advise if she can still get injection with [name] pt also would like to schedule cortisone injection on left shoulder on the same day if possible, please advise

## 2022-06-14 ENCOUNTER — Other Ambulatory Visit: Payer: Self-pay

## 2022-06-14 DIAGNOSIS — M1711 Unilateral primary osteoarthritis, right knee: Secondary | ICD-10-CM

## 2022-06-14 NOTE — Telephone Encounter (Signed)
Talked with patient and appt.has been scheduled for gel injection.  

## 2022-06-24 ENCOUNTER — Other Ambulatory Visit: Payer: Self-pay

## 2022-06-24 ENCOUNTER — Encounter (HOSPITAL_COMMUNITY): Payer: Self-pay

## 2022-06-24 ENCOUNTER — Inpatient Hospital Stay (HOSPITAL_COMMUNITY)
Admission: EM | Admit: 2022-06-24 | Discharge: 2022-06-27 | DRG: 378 | Disposition: A | Discharge status: Home / Self Care | Payer: 59 | Attending: Internal Medicine | Admitting: Internal Medicine

## 2022-06-24 DIAGNOSIS — F319 Bipolar disorder, unspecified: Secondary | ICD-10-CM | POA: Diagnosis present

## 2022-06-24 DIAGNOSIS — Z9851 Tubal ligation status: Secondary | ICD-10-CM

## 2022-06-24 DIAGNOSIS — Z87891 Personal history of nicotine dependence: Secondary | ICD-10-CM | POA: Diagnosis not present

## 2022-06-24 DIAGNOSIS — M4854XA Collapsed vertebra, not elsewhere classified, thoracic region, initial encounter for fracture: Secondary | ICD-10-CM | POA: Diagnosis present

## 2022-06-24 DIAGNOSIS — K7460 Unspecified cirrhosis of liver: Secondary | ICD-10-CM | POA: Diagnosis present

## 2022-06-24 DIAGNOSIS — I5031 Acute diastolic (congestive) heart failure: Secondary | ICD-10-CM | POA: Diagnosis not present

## 2022-06-24 DIAGNOSIS — R195 Other fecal abnormalities: Secondary | ICD-10-CM | POA: Diagnosis present

## 2022-06-24 DIAGNOSIS — I5032 Chronic diastolic (congestive) heart failure: Secondary | ICD-10-CM | POA: Diagnosis not present

## 2022-06-24 DIAGNOSIS — Z79899 Other long term (current) drug therapy: Secondary | ICD-10-CM | POA: Diagnosis not present

## 2022-06-24 DIAGNOSIS — M545 Low back pain, unspecified: Secondary | ICD-10-CM | POA: Diagnosis present

## 2022-06-24 DIAGNOSIS — B1910 Unspecified viral hepatitis B without hepatic coma: Secondary | ICD-10-CM | POA: Diagnosis present

## 2022-06-24 DIAGNOSIS — K2971 Gastritis, unspecified, with bleeding: Principal | ICD-10-CM | POA: Diagnosis present

## 2022-06-24 DIAGNOSIS — Z87440 Personal history of urinary (tract) infections: Secondary | ICD-10-CM

## 2022-06-24 DIAGNOSIS — I10 Essential (primary) hypertension: Secondary | ICD-10-CM | POA: Diagnosis not present

## 2022-06-24 DIAGNOSIS — I2782 Chronic pulmonary embolism: Secondary | ICD-10-CM | POA: Diagnosis not present

## 2022-06-24 DIAGNOSIS — B181 Chronic viral hepatitis B without delta-agent: Secondary | ICD-10-CM | POA: Diagnosis present

## 2022-06-24 DIAGNOSIS — J449 Chronic obstructive pulmonary disease, unspecified: Secondary | ICD-10-CM | POA: Diagnosis present

## 2022-06-24 DIAGNOSIS — Z86711 Personal history of pulmonary embolism: Secondary | ICD-10-CM | POA: Diagnosis present

## 2022-06-24 DIAGNOSIS — Z7952 Long term (current) use of systemic steroids: Secondary | ICD-10-CM

## 2022-06-24 DIAGNOSIS — Z86718 Personal history of other venous thrombosis and embolism: Secondary | ICD-10-CM

## 2022-06-24 DIAGNOSIS — K922 Gastrointestinal hemorrhage, unspecified: Secondary | ICD-10-CM | POA: Diagnosis not present

## 2022-06-24 DIAGNOSIS — F1411 Cocaine abuse, in remission: Secondary | ICD-10-CM | POA: Diagnosis present

## 2022-06-24 DIAGNOSIS — J439 Emphysema, unspecified: Secondary | ICD-10-CM | POA: Diagnosis not present

## 2022-06-24 DIAGNOSIS — I130 Hypertensive heart and chronic kidney disease with heart failure and stage 1 through stage 4 chronic kidney disease, or unspecified chronic kidney disease: Secondary | ICD-10-CM | POA: Diagnosis present

## 2022-06-24 DIAGNOSIS — G894 Chronic pain syndrome: Secondary | ICD-10-CM | POA: Diagnosis present

## 2022-06-24 DIAGNOSIS — Z881 Allergy status to other antibiotic agents status: Secondary | ICD-10-CM

## 2022-06-24 DIAGNOSIS — F411 Generalized anxiety disorder: Secondary | ICD-10-CM | POA: Diagnosis not present

## 2022-06-24 DIAGNOSIS — M7989 Other specified soft tissue disorders: Secondary | ICD-10-CM | POA: Diagnosis not present

## 2022-06-24 DIAGNOSIS — Z90710 Acquired absence of both cervix and uterus: Secondary | ICD-10-CM

## 2022-06-24 DIAGNOSIS — I2699 Other pulmonary embolism without acute cor pulmonale: Secondary | ICD-10-CM | POA: Diagnosis present

## 2022-06-24 DIAGNOSIS — K219 Gastro-esophageal reflux disease without esophagitis: Secondary | ICD-10-CM | POA: Diagnosis present

## 2022-06-24 DIAGNOSIS — K224 Dyskinesia of esophagus: Secondary | ICD-10-CM | POA: Diagnosis present

## 2022-06-24 DIAGNOSIS — Z885 Allergy status to narcotic agent status: Secondary | ICD-10-CM

## 2022-06-24 DIAGNOSIS — D500 Iron deficiency anemia secondary to blood loss (chronic): Secondary | ICD-10-CM | POA: Diagnosis not present

## 2022-06-24 DIAGNOSIS — K2920 Alcoholic gastritis without bleeding: Secondary | ICD-10-CM | POA: Diagnosis not present

## 2022-06-24 DIAGNOSIS — S22000A Wedge compression fracture of unspecified thoracic vertebra, initial encounter for closed fracture: Secondary | ICD-10-CM | POA: Diagnosis present

## 2022-06-24 DIAGNOSIS — D62 Acute posthemorrhagic anemia: Secondary | ICD-10-CM | POA: Diagnosis present

## 2022-06-24 DIAGNOSIS — Z7901 Long term (current) use of anticoagulants: Secondary | ICD-10-CM

## 2022-06-24 DIAGNOSIS — N183 Chronic kidney disease, stage 3 unspecified: Secondary | ICD-10-CM | POA: Diagnosis present

## 2022-06-24 DIAGNOSIS — G8929 Other chronic pain: Secondary | ICD-10-CM | POA: Diagnosis present

## 2022-06-24 DIAGNOSIS — Z87442 Personal history of urinary calculi: Secondary | ICD-10-CM

## 2022-06-24 DIAGNOSIS — Z6841 Body Mass Index (BMI) 40.0 and over, adult: Secondary | ICD-10-CM | POA: Diagnosis not present

## 2022-06-24 DIAGNOSIS — Z888 Allergy status to other drugs, medicaments and biological substances status: Secondary | ICD-10-CM

## 2022-06-24 DIAGNOSIS — N1832 Chronic kidney disease, stage 3b: Secondary | ICD-10-CM | POA: Diagnosis present

## 2022-06-24 DIAGNOSIS — Z7951 Long term (current) use of inhaled steroids: Secondary | ICD-10-CM

## 2022-06-24 LAB — PROTIME-INR
INR: 2 — ABNORMAL HIGH (ref 0.8–1.2)
Prothrombin Time: 22.6 seconds — ABNORMAL HIGH (ref 11.4–15.2)

## 2022-06-24 LAB — COMPREHENSIVE METABOLIC PANEL
ALT: 25 U/L (ref 0–44)
AST: 39 U/L (ref 15–41)
Albumin: 3.2 g/dL — ABNORMAL LOW (ref 3.5–5.0)
Alkaline Phosphatase: 80 U/L (ref 38–126)
Anion gap: 9 (ref 5–15)
BUN: 19 mg/dL (ref 8–23)
CO2: 22 mmol/L (ref 22–32)
Calcium: 8.5 mg/dL — ABNORMAL LOW (ref 8.9–10.3)
Chloride: 106 mmol/L (ref 98–111)
Creatinine, Ser: 1.57 mg/dL — ABNORMAL HIGH (ref 0.44–1.00)
GFR, Estimated: 36 mL/min — ABNORMAL LOW (ref >60)
Glucose, Bld: 124 mg/dL — ABNORMAL HIGH (ref 70–99)
Potassium: 3.6 mmol/L (ref 3.5–5.1)
Sodium: 137 mmol/L (ref 135–145)
Total Bilirubin: 0.9 mg/dL (ref 0.3–1.2)
Total Protein: 6.2 g/dL — ABNORMAL LOW (ref 6.5–8.1)

## 2022-06-24 LAB — MRSA NEXT GEN BY PCR, NASAL: MRSA by PCR Next Gen: NOT DETECTED

## 2022-06-24 LAB — BPAM RBC
Blood Product Expiration Date: 202406182359
ISSUE DATE / TIME: 202406011732

## 2022-06-24 LAB — TYPE AND SCREEN

## 2022-06-24 LAB — CBC
HCT: 27.2 % — ABNORMAL LOW (ref 36.0–46.0)
Hemoglobin: 7.6 g/dL — ABNORMAL LOW (ref 12.0–15.0)
MCH: 23 pg — ABNORMAL LOW (ref 26.0–34.0)
MCHC: 27.9 g/dL — ABNORMAL LOW (ref 30.0–36.0)
MCV: 82.2 fL (ref 80.0–100.0)
Platelets: 177 10*3/uL (ref 150–400)
RBC: 3.31 MIL/uL — ABNORMAL LOW (ref 3.87–5.11)
RDW: 15.7 % — ABNORMAL HIGH (ref 11.5–15.5)
WBC: 4.8 10*3/uL (ref 4.0–10.5)
nRBC: 0 % (ref 0.0–0.2)

## 2022-06-24 LAB — PREPARE RBC (CROSSMATCH)

## 2022-06-24 LAB — HEMOGLOBIN AND HEMATOCRIT, BLOOD
HCT: 29.2 % — ABNORMAL LOW (ref 36.0–46.0)
Hemoglobin: 8.4 g/dL — ABNORMAL LOW (ref 12.0–15.0)

## 2022-06-24 MED ORDER — SODIUM CHLORIDE 0.9 % IV SOLN
50.0000 ug/h | INTRAVENOUS | Status: DC
  Administered 2022-06-24 – 2022-06-26 (×4): 50 ug/h via INTRAVENOUS
  Filled 2022-06-24 (×5): qty 1

## 2022-06-24 MED ORDER — ONDANSETRON HCL 4 MG/2ML IJ SOLN
4.0000 mg | Freq: Four times a day (QID) | INTRAMUSCULAR | Status: DC | PRN
  Administered 2022-06-24 – 2022-06-27 (×7): 4 mg via INTRAVENOUS
  Filled 2022-06-24 (×7): qty 2

## 2022-06-24 MED ORDER — CHLORHEXIDINE GLUCONATE CLOTH 2 % EX PADS
6.0000 | MEDICATED_PAD | Freq: Every day | CUTANEOUS | Status: DC
  Administered 2022-06-24 – 2022-06-26 (×2): 6 via TOPICAL

## 2022-06-24 MED ORDER — HYDROMORPHONE HCL 1 MG/ML IJ SOLN: 1.0000 mg | INTRAMUSCULAR | Status: DC | PRN

## 2022-06-24 MED ORDER — OCTREOTIDE LOAD VIA INFUSION
50.0000 ug | Freq: Once | INTRAVENOUS | Status: AC
  Administered 2022-06-24: 50 ug via INTRAVENOUS
  Filled 2022-06-24: qty 25

## 2022-06-24 MED ORDER — HYDROMORPHONE HCL 1 MG/ML IJ SOLN
1.0000 mg | Freq: Four times a day (QID) | INTRAMUSCULAR | Status: DC | PRN
  Administered 2022-06-24 – 2022-06-27 (×9): 1 mg via INTRAVENOUS
  Filled 2022-06-24 (×10): qty 1

## 2022-06-24 MED ORDER — PANTOPRAZOLE SODIUM 40 MG IV SOLR: 40.0000 mg | Freq: Two times a day (BID) | INTRAVENOUS | Status: DC

## 2022-06-24 MED ORDER — OXYCODONE HCL 5 MG PO TABS
5.0000 mg | ORAL_TABLET | Freq: Once | ORAL | Status: AC
  Administered 2022-06-24: 5 mg via ORAL
  Filled 2022-06-24: qty 1

## 2022-06-24 MED ORDER — MOMETASONE FURO-FORMOTEROL FUM 200-5 MCG/ACT IN AERO
2.0000 | INHALATION_SPRAY | Freq: Two times a day (BID) | RESPIRATORY_TRACT | Status: DC
  Administered 2022-06-25 – 2022-06-27 (×4): 2 via RESPIRATORY_TRACT
  Filled 2022-06-24: qty 8.8

## 2022-06-24 MED ORDER — ALBUTEROL SULFATE (2.5 MG/3ML) 0.083% IN NEBU
2.5000 mg | INHALATION_SOLUTION | RESPIRATORY_TRACT | Status: DC | PRN
  Administered 2022-06-25: 2.5 mg via RESPIRATORY_TRACT
  Filled 2022-06-24: qty 3

## 2022-06-24 MED ORDER — SODIUM CHLORIDE 0.9 % IV SOLN
2.0000 g | Freq: Once | INTRAVENOUS | Status: AC
  Administered 2022-06-24: 2 g via INTRAVENOUS
  Filled 2022-06-24: qty 20

## 2022-06-24 MED ORDER — SODIUM CHLORIDE 0.9% IV SOLUTION: Freq: Once | INTRAVENOUS | Status: AC

## 2022-06-24 MED ORDER — PANTOPRAZOLE INFUSION (NEW) - SIMPLE MED
8.0000 mg/h | INTRAVENOUS | Status: DC
  Administered 2022-06-24 – 2022-06-26 (×4): 8 mg/h via INTRAVENOUS
  Filled 2022-06-24 (×2): qty 80
  Filled 2022-06-24: qty 100
  Filled 2022-06-24 (×2): qty 80

## 2022-06-24 MED ORDER — PANTOPRAZOLE SODIUM 40 MG IV SOLR
40.0000 mg | Freq: Once | INTRAVENOUS | Status: AC
  Administered 2022-06-24: 40 mg via INTRAVENOUS
  Filled 2022-06-24: qty 10

## 2022-06-24 MED ORDER — DOXEPIN HCL 50 MG PO CAPS
100.0000 mg | ORAL_CAPSULE | Freq: Every day | ORAL | Status: DC
  Administered 2022-06-24 – 2022-06-26 (×3): 100 mg via ORAL
  Filled 2022-06-24 (×4): qty 2

## 2022-06-24 MED ORDER — ORAL CARE MOUTH RINSE: 15.0000 mL | OROMUCOSAL | Status: DC | PRN

## 2022-06-24 MED ORDER — SODIUM CHLORIDE 0.9 % IV SOLN
2.0000 g | INTRAVENOUS | Status: DC
  Administered 2022-06-25 – 2022-06-26 (×2): 2 g via INTRAVENOUS
  Filled 2022-06-24 (×2): qty 20

## 2022-06-24 MED ORDER — OXYCODONE HCL 10 MG PO TABS: 10.0000 mg | ORAL_TABLET | ORAL | Status: DC | PRN

## 2022-06-24 NOTE — ED Provider Notes (Signed)
EMERGENCY DEPARTMENT AT [facility] Provider Note   CSN: [id number] Arrival date & time: 06/24/22  1347     History  Chief Complaint  Patient presents with   Abnormal Labs    [name] is a 67 y.o. female.  67 yo  F with a chief complaints of having a low hemoglobin level.  The patient saw her family doctor yesterday as a normal follow-up after she had had an acute kidney injury.  They rechecked her blood work and called her back today and told her that her hemoglobin was around 6 and told her to come to the ER for transfusion.  She has been having dark stools.  Has multiple stools a day but does not think they have increased in frequency from her baseline.  She denies vomiting blood.  She has chronic pain everywhere and feels like she needs something for that.        Home Medications Prior to Admission medications   Medication Sig Start Date End Date Taking? Authorizing Provider  acetaminophen-codeine (TYLENOL #3) 300-30 MG tablet Take 1-2 tablets by mouth every 8 (eight) hours as needed for moderate pain. 08/08/21   Kathryne Hitch, MD  albuterol (PROAIR HFA) 108 (90 Base) MCG/ACT inhaler INHALE 2 PUFFS INTO THE LUNGS EVERY 4 HOURS AS NEEDED FOR WHEEZING Patient taking differently: Inhale 2 puffs into the lungs every 4 (four) hours as needed for shortness of breath or wheezing. 12/04/18   Oretha Milch, MD  albuterol (PROVENTIL) (2.5 MG/3ML) 0.083% nebulizer solution Take 3 mLs (2.5 mg total) by nebulization every 4 (four) hours as needed for wheezing or shortness of breath. 12/04/18   Oretha Milch, MD  ALPRAZolam Prudy Feeler) 1 MG tablet Take 0.5 tablets (0.5 mg total) by mouth 2 (two) times daily as needed for anxiety. Patient taking differently: Take 1 mg by mouth 3 (three) times daily. 06/02/15   Elgergawy, Leana Roe, MD  amoxicillin-clavulanate (AUGMENTIN) 875-125 MG tablet Take 1 tablet by mouth every 12 (twelve) hours. 05/17/22   Ellsworth Lennox, PA-C   baclofen (LIORESAL) 10 MG tablet Take 0.5-1 tablets (5-10 mg total) by mouth 3 (three) times daily as needed for muscle spasms. 03/29/20   Hilts, Casimiro Needle, MD  baclofen (LIORESAL) 20 MG tablet Take 20 mg by mouth at bedtime. 04/22/20   [provider]  budesonide-formoterol (SYMBICORT) 160-4.5 MCG/ACT inhaler Inhale 2 puffs into the lungs 2 (two) times daily. 12/04/18 07/18/20  Oretha Milch, MD  cefdinir (OMNICEF) 300 MG capsule Take 1 capsule (300 mg total) by mouth 2 (two) times daily. 04/25/21   Margarita Grizzle, MD  cephALEXin (KEFLEX) 500 MG capsule Take 1 capsule (500 mg total) by mouth 3 (three) times daily. 07/29/20   Raspet, Noberto Retort, PA-C  Cholecalciferol (VITAMIN D3) 50 MCG (2000 UT) TABS Take 2,000 Units by mouth daily.    [provider]  clotrimazole-betamethasone (LOTRISONE) cream Apply to affected area 2 times daily prn Patient taking differently: Apply 1 application topically 2 (two) times daily as needed (rash). Apply to affected area 2 times daily prn 04/29/20   Cathren Laine, MD  dextromethorphan-guaiFENesin [facility] DM) 30-600 MG 12hr tablet Take 1 tablet by mouth 2 (two) times daily. 05/17/22   Ellsworth Lennox, PA-C  dicyclomine (BENTYL) 20 MG tablet Take 1 tablet (20 mg total) by mouth 2 (two) times daily. 07/18/20   Sabas Sous, MD  docusate sodium (COLACE) 100 MG capsule Take 100 mg by mouth daily.  [provider]  entecavir (BARACLUDE) 0.5 MG tablet Take 0.5 mg by mouth every other day. 11/25/19   [provider]  famotidine (PEPCID) 20 MG tablet Take 20 mg by mouth 2 (two) times daily. 10/28/19   [provider]  gabapentin (NEURONTIN) 300 MG capsule Take 300 mg by mouth at bedtime. 07/13/20   [provider]  levocetirizine (XYZAL) 5 MG tablet Take 5 mg by mouth every evening. 12/02/19   [provider]  loperamide (IMODIUM) 2 MG capsule Take 1 capsule (2 mg total) by mouth 4 (four) times daily as needed for diarrhea or loose  stools. 07/18/20   Sabas Sous, MD  magnesium hydroxide (MILK OF MAGNESIA) 400 MG/5ML suspension Take 30 mLs by mouth daily as needed for mild constipation.    [provider]  metoprolol tartrate (LOPRESSOR) 25 MG tablet Take 25 mg by mouth 2 (two) times daily. 04/22/20   [provider]  montelukast (SINGULAIR) 10 MG tablet Take 10 mg by mouth at bedtime.    [provider]  Multiple Vitamins-Minerals (MULTIVITAMIN WOMEN 50+) TABS Take 1 tablet by mouth daily.    [provider]  naloxone [facility]) 4 MG/0.1ML LIQD nasal spray kit Place 4 mg into the nose once as needed (for accidental overdose). 07/01/19   [provider]  ondansetron (ZOFRAN ODT) 4 MG disintegrating tablet Take 1 tablet (4 mg total) by mouth every 8 (eight) hours as needed for nausea or vomiting. 07/18/20   Sabas Sous, MD  Oxycodone HCl 10 MG TABS Take 10 mg by mouth every 4 (four) hours as needed. Take 10 mg by mouth every four to six hours as needed for pain 10/19/18   [provider]  pantoprazole (PROTONIX) 40 MG tablet Take 40 mg by mouth 2 (two) times daily before a meal. 12/23/13   [provider]  predniSONE (DELTASONE) 10 MG tablet Take 1 tablet (10 mg total) by mouth in the morning, at noon, and at bedtime. 05/17/22   Ellsworth Lennox, PA-C  predniSONE (DELTASONE) 50 MG tablet Take one tablet daily for 5 days. 05/30/21   Kathryne Hitch, MD  QUEtiapine (SEROQUEL) 300 MG tablet Take 300 mg by mouth at bedtime. 05/06/20   [provider]  rivaroxaban (XARELTO) 20 MG TABS tablet Take 20 mg by mouth daily.    [provider]  spironolactone (ALDACTONE) 50 MG tablet Take 50 mg by mouth daily. 04/13/20   [provider]  sucralfate (CARAFATE) 1 g tablet Take 1 g by mouth 4 (four) times daily. 07/22/19   [provider]  SYMPROIC 0.2 MG TABS Take 0.2 mg by mouth daily. 04/30/20   [provider]  traMADol (ULTRAM) 50 MG  tablet Take 1-2 tablets (50-100 mg total) by mouth every 6 (six) hours as needed. 08/10/21   Kathryne Hitch, MD  enoxaparin (LOVENOX) 120 MG/0.8ML SOLN Inject 0.8 mLs (120 mg total) into the skin every 12 (twelve) hours. 11/30/10 04/16/11  Daryel Gerald, MD  loratadine (CLARITIN) 10 MG tablet Take 10 mg by mouth daily.    04/16/11  [provider]      Allergies    Amitriptyline hcl, Azithromycin, Fentanyl, Gabapentin, Hydromorphone hcl, Cymbalta [duloxetine hcl], Dilaudid [hydromorphone], and Neomycin-bacitracin zn-polymyx    Review of Systems   Review of Systems  Physical Exam Updated Vital Signs BP (!) 114/58   Pulse 92   Temp 99.3 F (37.4 C) (Oral)   Resp 18  Ht 5\' 4"  (1.626 m)   Wt 108 kg   SpO2 97%   BMI 40.87 kg/m  Physical Exam Vitals and nursing note reviewed.  Constitutional:      General: She is not in acute distress.    Appearance: She is well-developed. She is not diaphoretic.  HENT:     Head: Normocephalic and atraumatic.  Eyes:     Pupils: Pupils are equal, round, and reactive to light.  Cardiovascular:     Rate and Rhythm: Normal rate and regular rhythm.     Heart sounds: No murmur heard.    No friction rub. No gallop.  Pulmonary:     Effort: Pulmonary effort is normal.     Breath sounds: No wheezing or rales.  Abdominal:     General: There is no distension.     Palpations: Abdomen is soft.     Tenderness: There is no abdominal tenderness.  Musculoskeletal:        General: No tenderness.     Cervical back: Normal range of motion and neck supple.  Skin:    General: Skin is warm and dry.  Neurological:     Mental Status: She is alert and oriented to person, place, and time.  Psychiatric:        Behavior: Behavior normal.     ED Results / Procedures / Treatments   Labs (all labs ordered are listed, but only abnormal results are displayed) Labs Reviewed  COMPREHENSIVE METABOLIC PANEL - Abnormal; Notable for the  following components:      Result Value   Glucose, Bld 124 (*)    Creatinine, Ser 1.57 (*)    Calcium 8.5 (*)    Total Protein 6.2 (*)    Albumin 3.2 (*)    GFR, Estimated 36 (*)    All other components within normal limits  CBC - Abnormal; Notable for the following components:   RBC 3.31 (*)    Hemoglobin 7.6 (*)    HCT 27.2 (*)    MCH 23.0 (*)    MCHC 27.9 (*)    RDW 15.7 (*)    All other components within normal limits  PROTIME-INR  POC OCCULT BLOOD, ED  TYPE AND SCREEN  PREPARE RBC (CROSSMATCH)    EKG None  Radiology No results found.  Procedures .Critical Care  Performed by: Melene Plan, DO Authorized by: Melene Plan, DO   Critical care provider statement:    Critical care time (minutes):  35   Critical care time was exclusive of:  Separately billable procedures and treating other patients   Critical care was time spent personally by me on the following activities:  Development of treatment plan with patient or surrogate, discussions with consultants, evaluation of patient's response to treatment, examination of patient, ordering and review of laboratory studies, ordering and review of radiographic studies, ordering and performing treatments and interventions, pulse oximetry, re-evaluation of patient's condition and review of old charts   Care discussed with: admitting provider       Medications Ordered in ED Medications  cefTRIAXone (ROCEPHIN) 2 g in sodium chloride 0.9 % 100 mL IVPB (2 g Intravenous New Bag/Given 06/24/22 1607)  0.9 %  sodium chloride infusion (Manually program via Guardrails IV Fluids) (has no administration in time range)  octreotide (SANDOSTATIN) 2 mcg/mL load via infusion 50 mcg (50 mcg Intravenous Bolus from Bag 06/24/22 1628)    And  octreotide (SANDOSTATIN) 500 mcg in sodium chloride 0.9 % 250 mL (2 mcg/mL) infusion (has  no administration in time range)  pantoprazole (PROTONIX) injection 40 mg (40 mg Intravenous Given 06/24/22 1607)  oxyCODONE  (Oxy IR/ROXICODONE) immediate release tablet 5 mg (5 mg Oral Given 06/24/22 1606)    ED Course/ Medical Decision Making/ A&P                             Medical Decision Making Amount and/or Complexity of Data Reviewed Labs: ordered.  Risk Prescription drug management.   67 yo F with a chief complaints of dark and tarry stools and a low hemoglobin.  She has had dark stools for about 3 days now.  Had a routine visit with her PCP for lab recheck and was found to have low hemoglobin acutely.  She then came here for evaluation.  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  She is followed by multiple GI doctors.  Will order a unit of blood here.  She tells me she has a history of varices though I cannot verify this in the electronic medical record.  Will start on octreotide.  Protonix.  Rocephin.  Will discuss with GI.  Admit.  Hemoglobin here is 7.6.  No significant electrolyte abnormalities.  LFTs are unremarkable.  Discussed case with Dr. Dulce Sellar with [facility].  Recommend to get an INR.  Received patient bedside.  Will discuss with medicine.  The patients results and plan were reviewed and discussed.   Any x-rays performed were independently reviewed by myself.   Differential diagnosis were considered with the presenting HPI.  Medications  cefTRIAXone (ROCEPHIN) 2 g in sodium chloride 0.9 % 100 mL IVPB (2 g Intravenous New Bag/Given 06/24/22 1607)  0.9 %  sodium chloride infusion (Manually program via Guardrails IV Fluids) (has no administration in time range)  octreotide (SANDOSTATIN) 2 mcg/mL load via infusion 50 mcg (50 mcg Intravenous Bolus from Bag 06/24/22 1628)    And  octreotide (SANDOSTATIN) 500 mcg in sodium chloride 0.9 % 250 mL (2 mcg/mL) infusion (has no administration in time range)  pantoprazole (PROTONIX) injection 40 mg (40 mg Intravenous Given 06/24/22 1607)  oxyCODONE (Oxy IR/ROXICODONE) immediate release tablet 5 mg (5 mg Oral Given 06/24/22 1606)    Vitals:    06/24/22 1356 06/24/22 1358 06/24/22 1455  BP: (!) 143/101  (!) 114/58  Pulse: 78  92  Resp: 16  18  Temp: 99.3 F (37.4 C)    TempSrc: Oral    SpO2: 99%  97%  Weight:  108 kg   Height:  5\' 4"  (1.626 m)     Final diagnoses:  Upper GI bleed    Admission/ observation were discussed with the admitting physician, patient and/or family and they are comfortable with the plan.          Final Clinical Impression(s) / ED Diagnoses Final diagnoses:  Upper GI bleed    Rx / DC Orders ED Discharge Orders     None         Melene Plan, DO 06/24/22 1630

## 2022-06-24 NOTE — ED Notes (Signed)
ED TO INPATIENT HANDOFF REPORT  Name/Age/Gender [name] 67 y.o. female  Code Status Code Status History     Date Active Date Inactive Code Status Order ID Comments User Context   01/04/2020 1726 01/08/2020 1801 Full Code [id number]  Almon Hercules, MD ED   06/17/2016 1209 06/23/2016 2033 Full Code [id number]  Maretta Bees, MD Inpatient   06/09/2016 1845 06/11/2016 1922 Full Code [id number]  Edsel Petrin, DO Inpatient   05/31/2015 0025 06/02/2015 1747 Full Code [id number]  Hillary Bow, DO ED   05/26/2015 0103 05/27/2015 1656 Full Code [id number]  Lorretta Harp, MD ED   12/10/2014 1056 12/11/2014 1350 Full Code [id number]  Karie Soda, MD Inpatient   12/08/2013 2222 12/16/2013 1512 Full Code [id number]  Yevonne Pax, MD Inpatient   10/26/2013 1136 11/02/2013 1606 Full Code [id number]  Minor, Vilinda Blanks, NP ED   10/04/2013 0342 10/14/2013 1804 Full Code [id number]  Lorretta Harp, MD Inpatient   01/14/2013 1409 01/14/2013 2017 Full Code [id number]  Trevor Mace, PA-C ED   12/21/2012 1936 12/24/2012 1806 Full Code [id number]  Marinda Elk, MD Inpatient   11/27/2010 0614 11/30/2010 1745 Full Code [id number]  Sandria Senter, RN Inpatient       Home/SNF/Other Home  Chief Complaint UGI bleed [K92.2]  Level of Care/Admitting Diagnosis ED Disposition     ED Disposition  Admit   Condition  --   Comment  Hospital Area: [facility] [[id number]]  Level of Care: Stepdown [14]  Admit to SDU based on following criteria: Hemodynamic compromise or significant risk of instability:  Patient requiring short term acute titration and management of vasoactive drips, and invasive monitoring (i.e., CVP and Arterial line).  May admit patient to [facility] or [facility] if equivalent level of care is available:: No  Covid Evaluation: Asymptomatic - no recent exposure (last 10 days) testing not required  Diagnosis: UGI bleed [267063]  Admitting Physician: Bobette Mo  [[id number]]  Attending Physician: Bobette Mo [[id number]]  Certification:: I certify this patient will need inpatient services for at least 2 midnights  Estimated Length of Stay: 2          Medical History Past Medical History:  Diagnosis Date   Agoraphobia with panic attacks    Altered mental state 10/26/2013   Ankle fracture, left 11/27/2010   S/p ORIF 11/2    Anxiety    Arthritis    "knees; lower back" (10/30/2013)   Bipolar disorder (HCC)    Cavitary pneumonia    Cirrhosis (HCC)    CKD (chronic kidney disease), stage III (HCC)    COPD (chronic obstructive pulmonary disease) (HCC)    Depression    DVT (deep venous thrombosis) (HCC) 09/2013   RLE   GERD (gastroesophageal reflux disease)    Heart murmur    Hepatitis B    History of blood transfusion    "due to excessive blood loss before hysterectomy"   History of urinary tract infection    Hypertension    currently on no medication    Kidney stones    Pneumonia    "4 times in the past year" (10/30/2013)   Pulmonary emboli (HCC) 09/2013   Shortness of breath dyspnea    increased exertion;exercise   Urinary frequency    Urinary incontinence     Allergies Allergies  Allergen Reactions   Amitriptyline Hcl Other (See Comments)     Causes her to be very "disoriented".   Azithromycin  Rash   Fentanyl Other (See Comments)    Sees things Pt stated had hallucinations "d/t taking high dosage, but can tolerate low dosage"    Gabapentin Rash and Other (See Comments)    REACTION: rash in mouth    Hydromorphone Hcl Nausea And Vomiting   Cymbalta [Duloxetine Hcl] Other (See Comments)    Headaches    Dilaudid [Hydromorphone] Nausea And Vomiting   Neomycin-Bacitracin Zn-Polymyx Rash    IV Location/Drains/Wounds Patient Lines/Drains/Airways Status     Active Line/Drains/Airways     Name Placement date Placement time Site Days   Peripheral IV 06/24/22 20 G Right;Posterior Forearm 06/24/22  1509  Forearm  less than  1   Peripheral IV 06/24/22 22 G Left;Posterior Hand 06/24/22  1640  Hand  less than 1            Labs/Imaging Results for orders placed or performed during the hospital encounter of 06/24/22 (from the past 48 hour(s))  Comprehensive metabolic panel     Status: Abnormal   Collection Time: 06/24/22  2:44 PM  Result Value Ref Range   Sodium 137 135 - 145 mmol/L   Potassium 3.6 3.5 - 5.1 mmol/L    Comment: HEMOLYSIS AT THIS LEVEL MAY AFFECT RESULT   Chloride 106 98 - 111 mmol/L   CO2 22 22 - 32 mmol/L   Glucose, Bld 124 (H) 70 - 99 mg/dL    Comment: Glucose reference range applies only to samples taken after fasting for at least 8 hours.   BUN 19 8 - 23 mg/dL   Creatinine, Ser 1.61 (H) 0.44 - 1.00 mg/dL   Calcium 8.5 (L) 8.9 - 10.3 mg/dL   Total Protein 6.2 (L) 6.5 - 8.1 g/dL   Albumin 3.2 (L) 3.5 - 5.0 g/dL   AST 39 15 - 41 U/L    Comment: HEMOLYSIS AT THIS LEVEL MAY AFFECT RESULT   ALT 25 0 - 44 U/L    Comment: HEMOLYSIS AT THIS LEVEL MAY AFFECT RESULT   Alkaline Phosphatase 80 38 - 126 U/L   Total Bilirubin 0.9 0.3 - 1.2 mg/dL    Comment: HEMOLYSIS AT THIS LEVEL MAY AFFECT RESULT   GFR, Estimated 36 (L) >60 mL/min    Comment: (NOTE) Calculated using the CKD-EPI Creatinine Equation (2021)    Anion gap 9 5 - 15    Comment: Performed at [facility], [address], [place], [place] [address]  CBC     Status: Abnormal   Collection Time: 06/24/22  2:44 PM  Result Value Ref Range   WBC 4.8 4.0 - 10.5 K/uL   RBC 3.31 (L) 3.87 - 5.11 MIL/uL   Hemoglobin 7.6 (L) 12.0 - 15.0 g/dL   HCT 54.0 (L) 98.1 - 19.1 %   MCV 82.2 80.0 - 100.0 fL   MCH 23.0 (L) 26.0 - 34.0 pg   MCHC 27.9 (L) 30.0 - 36.0 g/dL   RDW 47.8 (H) 29.5 - 62.1 %   Platelets 177 150 - 400 K/uL   nRBC 0.0 0.0 - 0.2 %    Comment: Performed at [facility] [address] [place], [place] [address]  Protime-INR     Status: Abnormal   Collection Time: 06/24/22  3:55 PM   Result Value Ref Range   Prothrombin Time 22.6 (H) 11.4 - 15.2 seconds   INR 2.0 (H) 0.8 - 1.2    Comment: (NOTE) INR goal varies based on device and disease states. Performed at [facility]  [facility], [address], [place], [place] [address]    No results found.  Pending Labs [employer] (From admission, onward)     Start     Ordered   06/24/22 1550  Prepare RBC (crossmatch)  (Blood Administration Adult)  Once,   R       Question Answer Comment  # of Units 1 unit   Transfusion Indications Hemoglobin 8 gm/dL or less and orthopedic or cardiac surgery or pre-existing cardiac condition   Number of Units to Keep Ahead NO units ahead   If emergent release call blood bank Not emergent release      06/24/22 1550   06/24/22 1444  Type and screen [facility]  Once,   STAT       Comments: [facility]    06/24/22 1443            Vitals/Pain Today's Vitals   06/24/22 1356 06/24/22 1358 06/24/22 1455 06/24/22 1625  BP: (!) 143/101  (!) 114/58 (!) 140/85  Pulse: 78  92 90  Resp: 16  18   Temp: 99.3 F (37.4 C)     TempSrc: Oral     SpO2: 99%  97% 92%  Weight:  108 kg    Height:  5\' 4"  (1.626 m)    PainSc:  0-No pain      Isolation Precautions No active isolations  Medications Medications  0.9 %  sodium chloride infusion (Manually program via Guardrails IV Fluids) (has no administration in time range)  octreotide (SANDOSTATIN) 2 mcg/mL load via infusion 50 mcg (50 mcg Intravenous Bolus from Bag 06/24/22 1628)    And  octreotide (SANDOSTATIN) 500 mcg in sodium chloride 0.9 % 250 mL (2 mcg/mL) infusion (50 mcg/hr Intravenous New Bag/Given 06/24/22 1643)  HYDROmorphone (DILAUDID) injection 1 mg (has no administration in time range)  pantoprozole (PROTONIX) 80 mg /NS 100 mL infusion (has no administration in time range)  pantoprazole (PROTONIX) injection 40 mg (has no administration in time range)  pantoprazole (PROTONIX)  injection 40 mg (has no administration in time range)  cefTRIAXone (ROCEPHIN) 2 g in sodium chloride 0.9 % 100 mL IVPB (0 g Intravenous Stopped 06/24/22 1637)  pantoprazole (PROTONIX) injection 40 mg (40 mg Intravenous Given 06/24/22 1607)  oxyCODONE (Oxy IR/ROXICODONE) immediate release tablet 5 mg (5 mg Oral Given 06/24/22 1606)    Mobility walks with device

## 2022-06-24 NOTE — ED Notes (Signed)
Unsuccessful IV attempt x2.  

## 2022-06-24 NOTE — Progress Notes (Signed)
Patient arrived to room 1223 alert and oriented x 4. Denies needs or wants.

## 2022-06-24 NOTE — ED Triage Notes (Signed)
Pt states she got a call from pcp this morning stating her hgb was 6.0.

## 2022-06-24 NOTE — ED Notes (Signed)
Unsuccessful IV attempt.

## 2022-06-24 NOTE — H&P (Signed)
History and Physical    Patient: [name]:[id number] DOB: 23-Jun-1955 DOA: 06/24/2022 DOS: the patient was seen and examined on 06/24/2022 PCP: Tracey Harries, MD  Patient coming from: Home  Chief Complaint:  Chief Complaint  Patient presents with   Abnormal Labs   HPI: [name] is a 67 y.o. female with medical history significant of panic attacks, agoraphobia, anxiety, depression, bipolar disorder, history of AMS, benzodiazepine dependence, CKD stage III, cocaine abuse in remission, chronic lower back pain, cervical spinal stenosis, thoracic vertebra compression fracture, right humerus fracture, history of cellulitis of foot cellulitis, coronary arthrosclerosis, DVT, recurrent pulmonary embolism, left ankle fracture with ORIF, osteoarthritis, cavitary pneumonia, COPD, history of right pleural effusion, history of lung collapse, hypertension, history of UTI, vitamin D deficiency, tobacco abuse, thrombocytopenia, constipation, , hepatitis C, hepatitis B, hepatic cirrhosis, iron deficiency anemia who was referred by her PCP to the emergency department due to decreased hemoglobin to 7.6 g/dL.  The patient also endorses recent melanotic stools, dyspnea, fatigue and hypersomnolence.  No fever, chills or night sweats. No sore throat, rhinorrhea, dyspnea, wheezing or hemoptysis.  No chest pain, palpitations, diaphoresis, PND, orthopnea, but gets frequent  pitting edema of the lower extremities.  No appetite changes, diarrhea or hematochezia.  No flank pain, dysuria, frequency or hematuria.  No polyuria, polydipsia, polyphagia or blurred vision.  ED course: Initial vital signs were temperature 99.3 F, pulse 78, respirations 16, BP 143/101 mmHg and O2 sat 99% on room air.  The patient received ceftriaxone 2 g IVPB, oxycodone 5 mg p.o. x 1 pantoprazole 40 mg IVP and was started on a new octreotide infusion after 50 mcg IVP load.  Lab work: CBC is her white count 4.8, hemoglobin 7.6 g/dL platelets  811.  CMP showed normal electrolytes after calcium correction.  Glucose 124, BUN 19 and creatinine 1.57 mg/dL.  LFTs were normal, except for a total protein of 6.2 and albumin of 3.2 g/dL.   Review of Systems: As mentioned in the history of present illness. All other systems reviewed and are negative.  Past Medical History:  Diagnosis Date   Agoraphobia with panic attacks    Altered mental state 10/26/2013   Ankle fracture, left 11/27/2010   S/p ORIF 11/2    Anxiety    Arthritis    "knees; lower back" (10/30/2013)   Bipolar disorder (HCC)    Cavitary pneumonia    Cirrhosis (HCC)    CKD (chronic kidney disease), stage III (HCC)    COPD (chronic obstructive pulmonary disease) (HCC)    Depression    DVT (deep venous thrombosis) (HCC) 09/2013   RLE   GERD (gastroesophageal reflux disease)    Heart murmur    Hepatitis B    History of blood transfusion    "due to excessive blood loss before hysterectomy"   History of urinary tract infection    Hypertension    currently on no medication    Kidney stones    Pneumonia    "4 times in the past year" (10/30/2013)   Pulmonary emboli (HCC) 09/2013   Shortness of breath dyspnea    increased exertion;exercise   Urinary frequency    Urinary incontinence    Past Surgical History:  Procedure Laterality Date   ABDOMINAL HYSTERECTOMY     ANKLE FRACTURE SURGERY Left    CARPAL TUNNEL RELEASE Left    DILATION AND CURETTAGE OF UTERUS  X 2   FRACTURE SURGERY     HAND SURGERY  1980's/left hand    INSERTION OF MESH N/A 12/10/2014   Procedure: INSERTION OF MESH;  Surgeon: Karie Soda, MD;  Location: [facility];  Service: General;  Laterality: N/A;   LAPAROSCOPIC ASSISTED VENTRAL HERNIA REPAIR N/A 12/10/2014   Procedure: LAPAROSCOPIC ASSISTED REPAIR OF INCARCERATED UMBILICAL HERNIA ;  Surgeon: Karie Soda, MD;  Location: [facility];  Service: General;  Laterality: N/A;   TUBAL LIGATION     Social History:  reports that she quit smoking about 8 years  ago. Her smoking use included cigarettes. She has a 17.50 pack-year smoking history. She has never used smokeless tobacco. She reports that she does not drink alcohol and does not use drugs.  Allergies  Allergen Reactions   Amitriptyline Hcl Other (See Comments)     Causes her to be very "disoriented".   Azithromycin Rash   Fentanyl Other (See Comments)    Sees things Pt stated had hallucinations "d/t taking high dosage, but can tolerate low dosage"    Gabapentin Rash and Other (See Comments)    REACTION: rash in mouth    Hydromorphone Hcl Nausea And Vomiting   Cymbalta [Duloxetine Hcl] Other (See Comments)    Headaches    Dilaudid [Hydromorphone] Nausea And Vomiting   Neomycin-Bacitracin Zn-Polymyx Rash    Family History  Problem Relation Age of Onset   Stroke Mother    Lung cancer Father     Prior to Admission medications   Medication Sig Start Date End Date Taking? Authorizing Provider  acetaminophen-codeine (TYLENOL #3) 300-30 MG tablet Take 1-2 tablets by mouth every 8 (eight) hours as needed for moderate pain. 08/08/21   Kathryne Hitch, MD  albuterol (PROAIR HFA) 108 (90 Base) MCG/ACT inhaler INHALE 2 PUFFS INTO THE LUNGS EVERY 4 HOURS AS NEEDED FOR WHEEZING Patient taking differently: Inhale 2 puffs into the lungs every 4 (four) hours as needed for shortness of breath or wheezing. 12/04/18   Oretha Milch, MD  albuterol (PROVENTIL) (2.5 MG/3ML) 0.083% nebulizer solution Take 3 mLs (2.5 mg total) by nebulization every 4 (four) hours as needed for wheezing or shortness of breath. 12/04/18   Oretha Milch, MD  ALPRAZolam Prudy Feeler) 1 MG tablet Take 0.5 tablets (0.5 mg total) by mouth 2 (two) times daily as needed for anxiety. Patient taking differently: Take 1 mg by mouth 3 (three) times daily. 06/02/15   Elgergawy, Leana Roe, MD  amoxicillin-clavulanate (AUGMENTIN) 875-125 MG tablet Take 1 tablet by mouth every 12 (twelve) hours. 05/17/22   Ellsworth Lennox, PA-C  baclofen  (LIORESAL) 10 MG tablet Take 0.5-1 tablets (5-10 mg total) by mouth 3 (three) times daily as needed for muscle spasms. 03/29/20   Hilts, Casimiro Needle, MD  baclofen (LIORESAL) 20 MG tablet Take 20 mg by mouth at bedtime. 04/22/20   [provider]  budesonide-formoterol (SYMBICORT) 160-4.5 MCG/ACT inhaler Inhale 2 puffs into the lungs 2 (two) times daily. 12/04/18 07/18/20  Oretha Milch, MD  cefdinir (OMNICEF) 300 MG capsule Take 1 capsule (300 mg total) by mouth 2 (two) times daily. 04/25/21   Margarita Grizzle, MD  cephALEXin (KEFLEX) 500 MG capsule Take 1 capsule (500 mg total) by mouth 3 (three) times daily. 07/29/20   Raspet, Noberto Retort, PA-C  Cholecalciferol (VITAMIN D3) 50 MCG (2000 UT) TABS Take 2,000 Units by mouth daily.    [provider]  clotrimazole-betamethasone (LOTRISONE) cream Apply to affected area 2 times daily prn Patient taking differently: Apply 1 application topically 2 (two) times daily as needed (rash).  Apply to affected area 2 times daily prn 04/29/20   Cathren Laine, MD  dextromethorphan-guaiFENesin [facility] DM) 30-600 MG 12hr tablet Take 1 tablet by mouth 2 (two) times daily. 05/17/22   Ellsworth Lennox, PA-C  dicyclomine (BENTYL) 20 MG tablet Take 1 tablet (20 mg total) by mouth 2 (two) times daily. 07/18/20   Sabas Sous, MD  docusate sodium (COLACE) 100 MG capsule Take 100 mg by mouth daily.    [provider]  entecavir (BARACLUDE) 0.5 MG tablet Take 0.5 mg by mouth every other day. 11/25/19   [provider]  famotidine (PEPCID) 20 MG tablet Take 20 mg by mouth 2 (two) times daily. 10/28/19   [provider]  gabapentin (NEURONTIN) 300 MG capsule Take 300 mg by mouth at bedtime. 07/13/20   [provider]  levocetirizine (XYZAL) 5 MG tablet Take 5 mg by mouth every evening. 12/02/19   [provider]  loperamide (IMODIUM) 2 MG capsule Take 1 capsule (2 mg total) by mouth 4 (four) times daily as needed for diarrhea or loose stools.  07/18/20   Sabas Sous, MD  magnesium hydroxide (MILK OF MAGNESIA) 400 MG/5ML suspension Take 30 mLs by mouth daily as needed for mild constipation.    [provider]  metoprolol tartrate (LOPRESSOR) 25 MG tablet Take 25 mg by mouth 2 (two) times daily. 04/22/20   [provider]  montelukast (SINGULAIR) 10 MG tablet Take 10 mg by mouth at bedtime.    [provider]  Multiple Vitamins-Minerals (MULTIVITAMIN WOMEN 50+) TABS Take 1 tablet by mouth daily.    [provider]  naloxone [employer]) 4 MG/0.1ML LIQD nasal spray kit Place 4 mg into the nose once as needed (for accidental overdose). 07/01/19   [provider]  ondansetron (ZOFRAN ODT) 4 MG disintegrating tablet Take 1 tablet (4 mg total) by mouth every 8 (eight) hours as needed for nausea or vomiting. 07/18/20   Sabas Sous, MD  Oxycodone HCl 10 MG TABS Take 10 mg by mouth every 4 (four) hours as needed. Take 10 mg by mouth every four to six hours as needed for pain 10/19/18   [provider]  pantoprazole (PROTONIX) 40 MG tablet Take 40 mg by mouth 2 (two) times daily before a meal. 12/23/13   [provider]  predniSONE (DELTASONE) 10 MG tablet Take 1 tablet (10 mg total) by mouth in the morning, at noon, and at bedtime. 05/17/22   Ellsworth Lennox, PA-C  predniSONE (DELTASONE) 50 MG tablet Take one tablet daily for 5 days. 05/30/21   Kathryne Hitch, MD  QUEtiapine (SEROQUEL) 300 MG tablet Take 300 mg by mouth at bedtime. 05/06/20   [provider]  rivaroxaban (XARELTO) 20 MG TABS tablet Take 20 mg by mouth daily.    [provider]  spironolactone (ALDACTONE) 50 MG tablet Take 50 mg by mouth daily. 04/13/20   [provider]  sucralfate (CARAFATE) 1 g tablet Take 1 g by mouth 4 (four) times daily. 07/22/19   [provider]  SYMPROIC 0.2 MG TABS Take 0.2 mg by mouth daily. 04/30/20   [provider]  traMADol (ULTRAM) 50 MG tablet Take  1-2 tablets (50-100 mg total) by mouth every 6 (six) hours as needed. 08/10/21   Kathryne Hitch, MD  enoxaparin (LOVENOX) 120 MG/0.8ML SOLN Inject 0.8 mLs (120 mg total) into the skin every 12 (twelve) hours. 11/30/10 04/16/11  Daryel Gerald, MD  loratadine (CLARITIN) 10  MG tablet Take 10 mg by mouth daily.    04/16/11  [provider]    Physical Exam: Vitals:   06/24/22 1356 06/24/22 1358 06/24/22 1455  BP: (!) 143/101  (!) 114/58  Pulse: 78  92  Resp: 16  18  Temp: 99.3 F (37.4 C)    TempSrc: Oral    SpO2: 99%  97%  Weight:  108 kg   Height:  5\' 4"  (1.626 m)    Physical Exam Vitals and nursing note reviewed.  Constitutional:      General: She is awake. She is not in acute distress.    Appearance: Normal appearance. She is morbidly obese. She is ill-appearing.     Comments: Chronically ill-appearing.  HENT:     Head: Normocephalic.     Nose: No rhinorrhea.     Mouth/Throat:     Mouth: Mucous membranes are moist.     Pharynx: Posterior oropharyngeal erythema present.  Eyes:     Pupils: Pupils are equal, round, and reactive to light.  Neck:     Vascular: No JVD.  Cardiovascular:     Rate and Rhythm: Normal rate and regular rhythm.     Heart sounds: S1 normal and S2 normal.  Pulmonary:     Effort: Pulmonary effort is normal.     Breath sounds: Normal breath sounds.  Abdominal:     General: Bowel sounds are normal.     Palpations: Abdomen is soft.  Musculoskeletal:     Cervical back: Neck supple.     Right lower leg: 2+ Edema present.     Left lower leg: No edema.  Skin:    General: Skin is warm and dry.     Coloration: Skin is pale.  Neurological:     General: No focal deficit present.     Mental Status: She is alert and oriented to person, place, and time.  Psychiatric:        Mood and Affect: Mood normal.        Behavior: Behavior normal. Behavior is cooperative.    Data Reviewed:  Results are pending, will review when  available.  Assessment and Plan: Principal Problem:   ABLA (acute blood loss anemia) In the setting of:   UGI bleed Admit to stepdown/inpatient. Keep NPO for now. Hold rivaroxaban. Continue IV fluids. Continue pantoprazole infusion. Continue octreotide infusion. Monitor H&H. Transfuse as needed. Continue ceftriaxone for SBP prophylaxis. [facility] will be consulting.  Active Problems:   History of DVT (deep vein thrombosis)   History of pulmonary embolism RLE looks more than a dose. Will check bilateral venous Doppler. Holding DOAC at the moment. If positive, will need IVC filter.    Hepatitis B virus infection Continue Entecavir 0.5 mg every other day.    Anxiety  Continue doxepin 100 mg p.o. at bedtime.    Essential hypertension Hold antihypertensives due to blood loss.    COPD (chronic obstructive pulmonary disease) (HCC) No signs of decompensation. Continue Symbicort or formulary equivalent. SABA as needed.    Chronic low back pain   Chronic pain syndrome   Compression fracture of body of thoracic vertebra (HCC) Continue oxycodone 10 mg p.o. every 4 hours as needed. Hydromorphone 1 mg every 6 hours as needed for acute pain. Hydromorphone should be administered with ondansetron 4 mg IVP.    Advance Care Planning:   Code Status: Full Code   Consults: [facility] Willis Modena, MD).  Family Communication:   Severity of Illness: The appropriate  patient status for this patient is INPATIENT. Inpatient status is judged to be reasonable and necessary in order to provide the required intensity of service to ensure the patient's safety. The patient's presenting symptoms, physical exam findings, and initial radiographic and laboratory data in the context of their chronic comorbidities is felt to place them at high risk for further clinical deterioration. Furthermore, it is not anticipated that the patient will be medically stable for discharge from the hospital within 2  midnights of admission.   * I certify that at the point of admission it is my clinical judgment that the patient will require inpatient hospital care spanning beyond 2 midnights from the point of admission due to high intensity of service, high risk for further deterioration and high frequency of surveillance required.*  Author: Bobette Mo, MD 06/24/2022 4:47 PM  For on call review [email or web address].   This document was prepared using Dragon voice recognition software and may contain some unintended transcription errors.

## 2022-06-25 ENCOUNTER — Inpatient Hospital Stay (HOSPITAL_COMMUNITY): Payer: 59

## 2022-06-25 DIAGNOSIS — I10 Essential (primary) hypertension: Secondary | ICD-10-CM

## 2022-06-25 DIAGNOSIS — N1832 Chronic kidney disease, stage 3b: Secondary | ICD-10-CM

## 2022-06-25 DIAGNOSIS — B181 Chronic viral hepatitis B without delta-agent: Secondary | ICD-10-CM

## 2022-06-25 DIAGNOSIS — K7460 Unspecified cirrhosis of liver: Secondary | ICD-10-CM

## 2022-06-25 DIAGNOSIS — K922 Gastrointestinal hemorrhage, unspecified: Secondary | ICD-10-CM | POA: Diagnosis not present

## 2022-06-25 DIAGNOSIS — M7989 Other specified soft tissue disorders: Secondary | ICD-10-CM | POA: Diagnosis not present

## 2022-06-25 DIAGNOSIS — D500 Iron deficiency anemia secondary to blood loss (chronic): Secondary | ICD-10-CM

## 2022-06-25 DIAGNOSIS — I5032 Chronic diastolic (congestive) heart failure: Secondary | ICD-10-CM | POA: Diagnosis present

## 2022-06-25 DIAGNOSIS — J439 Emphysema, unspecified: Secondary | ICD-10-CM

## 2022-06-25 DIAGNOSIS — F411 Generalized anxiety disorder: Secondary | ICD-10-CM

## 2022-06-25 DIAGNOSIS — I2782 Chronic pulmonary embolism: Secondary | ICD-10-CM

## 2022-06-25 LAB — CBC
HCT: 29.1 % — ABNORMAL LOW (ref 36.0–46.0)
HCT: 32.1 % — ABNORMAL LOW (ref 36.0–46.0)
Hemoglobin: 8.5 g/dL — ABNORMAL LOW (ref 12.0–15.0)
Hemoglobin: 9.1 g/dL — ABNORMAL LOW (ref 12.0–15.0)
MCH: 23.9 pg — ABNORMAL LOW (ref 26.0–34.0)
MCH: 23.7 pg — ABNORMAL LOW (ref 26.0–34.0)
MCHC: 29.2 g/dL — ABNORMAL LOW (ref 30.0–36.0)
MCHC: 28.3 g/dL — ABNORMAL LOW (ref 30.0–36.0)
MCV: 81.7 fL (ref 80.0–100.0)
MCV: 83.6 fL (ref 80.0–100.0)
Platelets: 158 10*3/uL (ref 150–400)
Platelets: 161 10*3/uL (ref 150–400)
RBC: 3.56 MIL/uL — ABNORMAL LOW (ref 3.87–5.11)
RBC: 3.84 MIL/uL — ABNORMAL LOW (ref 3.87–5.11)
RDW: 16.7 % — ABNORMAL HIGH (ref 11.5–15.5)
RDW: 17 % — ABNORMAL HIGH (ref 11.5–15.5)
WBC: 5 10*3/uL (ref 4.0–10.5)
WBC: 6.5 10*3/uL (ref 4.0–10.5)
nRBC: 0 % (ref 0.0–0.2)
nRBC: 0 % (ref 0.0–0.2)

## 2022-06-25 LAB — COMPREHENSIVE METABOLIC PANEL
ALT: 23 U/L (ref 0–44)
AST: 22 U/L (ref 15–41)
Albumin: 3.2 g/dL — ABNORMAL LOW (ref 3.5–5.0)
Alkaline Phosphatase: 83 U/L (ref 38–126)
Anion gap: 8 (ref 5–15)
BUN: 18 mg/dL (ref 8–23)
CO2: 24 mmol/L (ref 22–32)
Calcium: 8.3 mg/dL — ABNORMAL LOW (ref 8.9–10.3)
Chloride: 105 mmol/L (ref 98–111)
Creatinine, Ser: 1.45 mg/dL — ABNORMAL HIGH (ref 0.44–1.00)
GFR, Estimated: 40 mL/min — ABNORMAL LOW (ref >60)
Glucose, Bld: 130 mg/dL — ABNORMAL HIGH (ref 70–99)
Potassium: 3.7 mmol/L (ref 3.5–5.1)
Sodium: 137 mmol/L (ref 135–145)
Total Bilirubin: 0.5 mg/dL (ref 0.3–1.2)
Total Protein: 6.2 g/dL — ABNORMAL LOW (ref 6.5–8.1)

## 2022-06-25 LAB — BRAIN NATRIURETIC PEPTIDE: B Natriuretic Peptide: 32 pg/mL (ref 0.0–100.0)

## 2022-06-25 LAB — GROUP A STREP BY PCR: Group A Strep by PCR: NOT DETECTED

## 2022-06-25 MED ORDER — ALPRAZOLAM 0.5 MG PO TABS
1.0000 mg | ORAL_TABLET | Freq: Three times a day (TID) | ORAL | Status: DC
  Administered 2022-06-25 – 2022-06-27 (×7): 1 mg via ORAL
  Filled 2022-06-25 (×7): qty 2

## 2022-06-25 MED ORDER — ALPRAZOLAM 0.5 MG PO TABS: 1.0000 mg | ORAL_TABLET | Freq: Three times a day (TID) | ORAL | Status: DC

## 2022-06-25 MED ORDER — HYDRALAZINE HCL 20 MG/ML IJ SOLN: 10.0000 mg | Freq: Four times a day (QID) | INTRAMUSCULAR | Status: DC | PRN

## 2022-06-25 MED ORDER — CARVEDILOL 6.25 MG PO TABS
6.2500 mg | ORAL_TABLET | Freq: Two times a day (BID) | ORAL | Status: DC
  Administered 2022-06-25 – 2022-06-27 (×4): 6.25 mg via ORAL
  Filled 2022-06-25 (×4): qty 1

## 2022-06-25 MED ORDER — DOXEPIN HCL 50 MG PO CAPS: 100.0000 mg | ORAL_CAPSULE | Freq: Every day | ORAL | Status: DC

## 2022-06-25 MED ORDER — ALBUTEROL SULFATE (2.5 MG/3ML) 0.083% IN NEBU
2.5000 mg | INHALATION_SOLUTION | Freq: Two times a day (BID) | RESPIRATORY_TRACT | Status: DC
  Administered 2022-06-25 – 2022-06-27 (×3): 2.5 mg via RESPIRATORY_TRACT
  Filled 2022-06-25 (×3): qty 3

## 2022-06-25 MED ORDER — MONTELUKAST SODIUM 10 MG PO TABS
10.0000 mg | ORAL_TABLET | Freq: Every day | ORAL | Status: DC
  Administered 2022-06-25 – 2022-06-26 (×2): 10 mg via ORAL
  Filled 2022-06-25 (×2): qty 1

## 2022-06-25 MED ORDER — ALUM & MAG HYDROXIDE-SIMETH 200-200-20 MG/5ML PO SUSP: 30.0000 mL | ORAL | Status: DC | PRN

## 2022-06-25 MED ORDER — BACLOFEN 20 MG PO TABS
20.0000 mg | ORAL_TABLET | Freq: Every day | ORAL | Status: DC
  Administered 2022-06-25 – 2022-06-26 (×2): 20 mg via ORAL
  Filled 2022-06-25 (×2): qty 1

## 2022-06-25 MED ORDER — BUPROPION HCL ER (XL) 150 MG PO TB24
150.0000 mg | ORAL_TABLET | Freq: Every morning | ORAL | Status: DC
  Administered 2022-06-26 – 2022-06-27 (×2): 150 mg via ORAL
  Filled 2022-06-25 (×2): qty 1

## 2022-06-25 MED ORDER — LIP MEDEX EX OINT
TOPICAL_OINTMENT | CUTANEOUS | Status: DC | PRN
  Administered 2022-06-25: 75 via TOPICAL
  Filled 2022-06-25: qty 7

## 2022-06-25 MED ORDER — GUAIFENESIN 100 MG/5ML PO LIQD
5.0000 mL | ORAL | Status: DC | PRN
  Administered 2022-06-25: 5 mL via ORAL
  Filled 2022-06-25: qty 10

## 2022-06-25 MED ORDER — ENTECAVIR 0.5 MG PO TABS: 0.5000 mg | ORAL_TABLET | ORAL | Status: DC

## 2022-06-25 NOTE — Assessment & Plan Note (Signed)
History of multiple pulmonary emboli on lifelong therapy.   Will resume Xarelto and monitor overnight for bleeding complications.

## 2022-06-25 NOTE — Assessment & Plan Note (Signed)
Continue scheduled Xanax Regimen has been confirmed via controlled substance database.

## 2022-06-25 NOTE — Progress Notes (Signed)
Bilateral lower extremity venous duplex has been completed. Preliminary results can be found in CV Proc through chart review.   06/25/22 9:28 AM Olen Cordial RVT

## 2022-06-25 NOTE — Assessment & Plan Note (Signed)
   No evidence of COPD exacerbation this time  Continue home regimen of maintenance inhalers  As needed bronchodilator therapy for episodic shortness of breath and wheezing.  

## 2022-06-25 NOTE — Assessment & Plan Note (Signed)
Continue longstanding regimen of Entecavir Supportive care

## 2022-06-25 NOTE — Progress Notes (Signed)
PROGRESS NOTE   [name]  JXB:[id number] DOB: April 08, 1955 DOA: 06/24/2022 PCP: Tracey Harries, MD   Date of Service: the patient was seen and examined on 06/25/2022  Brief Narrative:  67 year old female with past medical history of GERD, anxiety disorder, depression, agoraphobia, bipolar disorder, chronic kidney disease stage IIIb, cocaine abuse (remote), COPD hypertension, vitamin D deficiency hepatitis B cirrhosis (on Entecavir, follows with [facility]), prior pulmonary embolism (on Xarelto), iron deficiency anemia, diastolic congestive heart failure (Echo 2017 EF 55-60% with G1DD) presenting to [facility] emergency department by her primary care provider due to having a low hemoglobin of 7.6.  Upon evaluation in the emergency department patient.  Considering patient's history of cirrhosis patient was initiated on intravenous Protonix, octreotide and ceftriaxone.  1 unit of packed red blood cells was transfused.  The hospitalist group was then called to assess the patient for admission to the hospital.  [facility] gastroenterology was contacted for consultation and patient was admitted to the stepdown unit under the hospitalist service.     Assessment and Plan: Iron deficiency anemia due to chronic blood loss Patient presenting with substantial anemia, suspected to be secondary to chronic blood loss in a patient with known cirrhosis on chronic anticoagulation Patient is status post 1 unit packed red blood cell transfusion during his hospitalization with hemoglobin incrementing appropriately He will gastroenterology consulted, their input is appreciated.  They plan to proceed with EGD morning of 6/3. Continue prophylactic ceftriaxone considering possible bleeding in the setting of cirrhosis. Continue Protonix, continue octreotide Continue to monitor hemoglobin and hematocrit with serial CBCs  Essential hypertension Continue Coreg As needed intravenous antihypertensives for  markedly elevated blood pressures.  Chronic hepatitis B with cirrhosis (HCC) Continue longstanding regimen of Entecavir Supportive care  Chronic kidney disease, stage 3b (HCC) Strict intake and output monitoring Creatinine near baseline Minimizing nephrotoxic agents as much as possible Serial chemistries to monitor renal function and electrolytes   COPD (chronic obstructive pulmonary disease) (HCC) No evidence of COPD exacerbation this time Continue home regimen of maintenance inhalers As needed bronchodilator therapy for episodic shortness of breath and wheezing.   Generalized anxiety disorder Continue scheduled Xanax Regimen has been confirmed via controlled substance database.  Chronic diastolic CHF (congestive heart failure) (HCC) Coarse breath sounds on exam Obtaining chest x-ray, BNP and echocardiogram  Pulmonary embolism (HCC) History of multiple pulmonary emboli on lifelong therapy.   Temporarily holding Xarelto as we manage the patient's potential bleeding    Subjective:  Patient denies abdominal pain, denies melena or blood in the stool.  Patient denies chest pain or shortness of breath.  Patient does complain of cough wheezing and mild shortness of breath however.  Physical Exam:  Vitals:   06/25/22 1800 06/25/22 1956 06/25/22 2046 06/25/22 2050  BP: 117/70  (!) 153/127   Pulse: 82  (!) 101   Resp: 15  20   Temp:  98.3 F (36.8 C) 98.7 F (37.1 C)   TempSrc:  Oral Oral   SpO2: 94% 96% 100%   Weight:    118.6 kg  Height:    5\' 4"  (1.626 m)    Constitutional: Awake alert and oriented x3, no associated distress.   Skin: no rashes, no lesions, poor skin turgor noted. Eyes: Pupils are equally reactive to light.  No evidence of scleral icterus or conjunctival pallor.  ENMT: Moist mucous membranes noted.  Posterior pharynx clear of any exudate or lesions.   Respiratory: Notable coarse of breath sounds with  intermittent wheezing and bibasilar rales.  Normal  respiratory effort. No accessory muscle use.  Cardiovascular: Regular rate and rhythm, no murmurs / rubs / gallops. No extremity edema. 2+ pedal pulses. No carotid bruits.  Abdomen: Abdomen is soft and nontender.  No evidence of intra-abdominal masses.  Positive bowel sounds noted in all quadrants.   Musculoskeletal: No joint deformity upper and lower extremities. Good ROM, no contractures. Normal muscle tone.    Data Reviewed:  I have personally reviewed and interpreted labs, imaging.  Significant findings are   CBC: Recent Labs  Lab 06/24/22 1444 06/24/22 2129 06/25/22 0228 06/25/22 1541  WBC 4.8  --  5.0 6.5  HGB 7.6* 8.4* 8.5* 9.1*  HCT 27.2* 29.2* 29.1* 32.1*  MCV 82.2  --  81.7 83.6  PLT 177  --  158 161   Basic Metabolic Panel: Recent Labs  Lab 06/24/22 1444 06/25/22 0228  NA 137 137  K 3.6 3.7  CL 106 105  CO2 22 24  GLUCOSE 124* 130*  BUN 19 18  CREATININE 1.57* 1.45*  CALCIUM 8.5* 8.3*   GFR: Estimated Creatinine Clearance: 49 mL/min (A) (by C-G formula based on SCr of 1.45 mg/dL (H)). Liver Function Tests: Recent Labs  Lab 06/24/22 1444 06/25/22 0228  AST 39 22  ALT 25 23  ALKPHOS 80 83  BILITOT 0.9 0.5  PROT 6.2* 6.2*  ALBUMIN 3.2* 3.2*    Coagulation Profile: Recent Labs  Lab 06/24/22 1555  INR 2.0*     EKG/Telemetry: Personally reviewed.  Rhythm is normal sinus rhythm with heart rate of 85.  No dynamic ST segment changes appreciated.   Code Status:  Full code.  Code status decision has been confirmed with: patient   Severity of Illness:  The appropriate patient status for this patient is INPATIENT. Inpatient status is judged to be reasonable and necessary in order to provide the required intensity of service to ensure the patient's safety. The patient's presenting symptoms, physical exam findings, and initial radiographic and laboratory data in the context of their chronic comorbidities is felt to place them at high risk for further  clinical deterioration. Furthermore, it is not anticipated that the patient will be medically stable for discharge from the hospital within 2 midnights of admission.   * I certify that at the point of admission it is my clinical judgment that the patient will require inpatient hospital care spanning beyond 2 midnights from the point of admission due to high intensity of service, high risk for further deterioration and high frequency of surveillance required.*  Time spent:  52 minutes  Author:  Marinda Elk MD  06/25/2022 11:22 PM

## 2022-06-25 NOTE — Consult Note (Signed)
[facility] Consultation Note  Referring Provider: [facility] Primary Care Physician:  Tracey Harries, MD  Reason for Consultation:  anemia, dark stools  HPI: [name] is a 67 y.o. 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  No hematemesis.  No hematochezia.  Has generalized abdominal pain.  Has progressive solid-predominant dysphagia.  Reports prior endoscopies and colonoscopies.  Reports history of esophageal dilatations in setting of esophageal strictures with dysphagia.  Reports what sounds like pH study and manometry which per patient were "abnormal."  Based on care-everywhere, the last endoscopy/colonoscopy patient had were in 2021 (elsewhere).   Past Medical History:  Diagnosis Date   Agoraphobia with panic attacks    Altered mental state 10/26/2013   Ankle fracture, left 11/27/2010   S/p ORIF 11/2    Anxiety    Arthritis    "knees; lower back" (10/30/2013)   Bipolar disorder (HCC)    Cavitary pneumonia    Cirrhosis (HCC)    CKD (chronic kidney disease), stage III (HCC)    COPD (chronic obstructive pulmonary disease) (HCC)    Depression    DVT (deep venous thrombosis) (HCC) 09/2013   RLE   GERD (gastroesophageal reflux disease)    Heart murmur    Hepatitis B    History of blood transfusion    "due to excessive blood loss before hysterectomy"   History of urinary tract infection    Hypertension    currently on no medication    Kidney stones    Pneumonia    "4 times in the past year" (10/30/2013)   Pulmonary emboli (HCC) 09/2013   Shortness of breath dyspnea    increased exertion;exercise   Urinary frequency    Urinary incontinence     Past Surgical History:  Procedure Laterality Date   ABDOMINAL HYSTERECTOMY     ANKLE FRACTURE SURGERY Left    CARPAL TUNNEL RELEASE Left    DILATION AND CURETTAGE OF UTERUS  X 2   FRACTURE  SURGERY     HAND SURGERY     1980's/left hand    INSERTION OF MESH N/A 12/10/2014   Procedure: INSERTION OF MESH;  Surgeon: Karie Soda, MD;  Location: [facility];  Service: General;  Laterality: N/A;   LAPAROSCOPIC ASSISTED VENTRAL HERNIA REPAIR N/A 12/10/2014   Procedure: LAPAROSCOPIC ASSISTED REPAIR OF INCARCERATED UMBILICAL HERNIA ;  Surgeon: Karie Soda, MD;  Location: [facility];  Service: General;  Laterality: N/A;   TUBAL LIGATION      Prior to Admission medications   Medication Sig Start Date End Date Taking? Authorizing Provider  albuterol (PROAIR HFA) 108 (90 Base) MCG/ACT inhaler INHALE 2 PUFFS INTO THE LUNGS EVERY 4 HOURS AS NEEDED FOR WHEEZING Patient taking differently: Inhale 2 puffs into the lungs every 4 (four) hours as needed for shortness of breath or wheezing. 12/04/18  Yes Oretha Milch, MD  albuterol (PROVENTIL) (2.5 MG/3ML) 0.083% nebulizer solution Take 3 mLs (2.5 mg total) by nebulization every 4 (four) hours as needed for wheezing or shortness of breath. 12/04/18  Yes Oretha Milch, MD  ALPRAZolam Prudy Feeler) 1 MG tablet Take 0.5 tablets (0.5 mg total) by mouth 2 (two) times daily as needed for anxiety. Patient taking differently: Take 1 mg by mouth 3 (three) times daily. 06/02/15  Yes Elgergawy, Leana Roe, MD  baclofen (LIORESAL) 20 MG tablet Take 20 mg by mouth at bedtime. 04/22/20  Yes [provider]  budesonide-formoterol (SYMBICORT) 160-4.5 MCG/ACT inhaler Inhale 2 puffs into the lungs 2 (two) times daily. 12/04/18 06/24/22 Yes Oretha Milch, MD  carvedilol (COREG) 6.25 MG tablet Take 6.25 mg by mouth 2 (two) times daily.   Yes [provider]  Cholecalciferol (VITAMIN D3) 50 MCG (2000 UT) TABS Take 2,000 Units by mouth daily.   Yes [provider]  docusate sodium (COLACE) 100 MG capsule Take 100 mg by mouth 2 (two) times daily.   Yes [provider]  doxepin (SINEQUAN) 100 MG capsule Take 100 mg by mouth at bedtime. 06/01/22  Yes  [provider]  doxycycline (VIBRAMYCIN) 100 MG capsule Take 100 mg by mouth 2 (two) times daily. 06/18/22 06/25/22 Yes [provider]  entecavir (BARACLUDE) 0.5 MG tablet Take 0.5 mg by mouth every other day. 11/25/19  Yes [provider]  famotidine (PEPCID) 20 MG tablet Take 20 mg by mouth 2 (two) times daily. 10/28/19  Yes [provider]  gabapentin (NEURONTIN) 300 MG capsule Take 300 mg by mouth 3 (three) times daily. 07/13/20  Yes [provider]  levocetirizine (XYZAL) 5 MG tablet Take 5 mg by mouth every evening. 12/02/19  Yes [provider]  LORazepam (ATIVAN) 1 MG tablet Take 1 mg by mouth every 8 (eight) hours as needed for anxiety.   Yes [provider]  montelukast (SINGULAIR) 10 MG tablet Take 10 mg by mouth at bedtime.   Yes [provider]  ondansetron (ZOFRAN ODT) 4 MG disintegrating tablet Take 1 tablet (4 mg total) by mouth every 8 (eight) hours as needed for nausea or vomiting. 07/18/20  Yes Sabas Sous, MD  pantoprazole (PROTONIX) 40 MG tablet Take 40 mg by mouth 2 (two) times daily before a meal. 12/23/13  Yes [provider]  rivaroxaban (XARELTO) 20 MG TABS tablet Take 20 mg by mouth daily.   Yes [provider]  sucralfate (CARAFATE) 1 g tablet Take 1 g by mouth 4 (four) times daily. 07/22/19  Yes [provider]  valACYclovir (VALTREX) 500 MG tablet Take 500 mg by mouth daily as needed (breakout). 08/21/17  Yes [provider]  buPROPion (WELLBUTRIN XL) 150 MG 24 hr tablet Take 150 mg by mouth every morning.    [provider]  cefdinir (OMNICEF) 300 MG capsule Take 1 capsule (300 mg total) by mouth 2 (two) times daily. 04/25/21   Margarita Grizzle, MD  clotrimazole-betamethasone (LOTRISONE) cream Apply to affected area 2 times daily prn Patient taking differently: Apply 1 application topically 2 (two) times daily as needed (rash). Apply to affected area 2 times daily  prn 04/29/20   Cathren Laine, MD  metoprolol tartrate (LOPRESSOR) 25 MG tablet Take 25 mg by mouth 2 (two) times daily. 04/22/20   [provider]  spironolactone (ALDACTONE) 50 MG tablet Take 50 mg by mouth daily. 04/13/20   [provider]  enoxaparin (LOVENOX) 120 MG/0.8ML SOLN Inject 0.8 mLs (120 mg total) into the skin every 12 (twelve) hours. 11/30/10 04/16/11  Daryel Gerald, MD  loratadine (CLARITIN) 10 MG tablet Take 10 mg by mouth daily.    04/16/11  [provider]    Current Facility-Administered Medications  Medication Dose Route Frequency Provider Last Rate Last Admin   albuterol (PROVENTIL) (2.5 MG/3ML) 0.083% nebulizer solution 2.5 mg  2.5 mg Inhalation Q4H PRN Bobette Mo, MD   2.5 mg at 06/25/22 0815   ALPRAZolam (XANAX) tablet 1 mg  1 mg Oral TID Marinda Elk, MD   1 mg at 06/25/22 1024   baclofen (LIORESAL) tablet 20 mg  20 mg Oral QHS Marinda Elk, MD       [START ON 06/26/2022] buPROPion (WELLBUTRIN XL) 24 hr tablet 150 mg  150 mg Oral q morning Shalhoub, Deno Lunger, MD       carvedilol (COREG) tablet 6.25 mg  6.25 mg Oral BID Marinda Elk, MD       cefTRIAXone (ROCEPHIN) 2 g in sodium chloride 0.9 % 100 mL IVPB  2 g Intravenous Q24H Bobette Mo, MD       Chlorhexidine Gluconate Cloth 2 % PADS 6 each  6 each Topical Daily Bobette Mo, MD   6 each at 06/24/22 2030   doxepin (SINEQUAN) capsule 100 mg  100 mg Oral QHS Bobette Mo, MD   100 mg at 06/24/22 2247   [START ON 06/26/2022] entecavir (BARACLUDE) tablet 0.5 mg  0.5 mg Oral QODAY Shalhoub, Deno Lunger, MD       HYDROmorphone (DILAUDID) injection 1 mg  1 mg Intravenous Q6H PRN Bobette Mo, MD   1 mg at 06/25/22 1024   mometasone-formoterol (DULERA) 200-5 MCG/ACT inhaler 2 puff  2 puff Inhalation BID Bobette Mo, MD   2 puff at 06/25/22 0815   montelukast (SINGULAIR) tablet 10 mg  10 mg Oral QHS Marinda Elk, MD        octreotide (SANDOSTATIN) 500 mcg in sodium chloride 0.9 % 250 mL (2 mcg/mL) infusion  50 mcg/hr Intravenous Continuous Bobette Mo, MD 25 mL/hr at 06/25/22 1027 50 mcg/hr at 06/25/22 1027   ondansetron (ZOFRAN) injection 4 mg  4 mg Intravenous Q6H PRN Bobette Mo, MD   4 mg at 06/25/22 1024   Oral care mouth rinse  15 mL Mouth Rinse PRN Bobette Mo, MD       [START ON 06/28/2022] pantoprazole (PROTONIX) injection 40 mg  40 mg Intravenous Q12H Bobette Mo, MD       pantoprozole (PROTONIX) 80 mg /NS 100 mL infusion  8 mg/hr Intravenous Continuous Bobette Mo, MD 10 mL/hr at 06/25/22 1027 8 mg/hr at 06/25/22 1027    Allergies as of 06/24/2022 - Review Complete 06/24/2022  Allergen Reaction Noted   Amitriptyline hcl Other (See Comments) 05/28/2015   Azithromycin Rash 04/18/2020   Fentanyl Other (See Comments) 08/02/2018   Cymbalta [duloxetine hcl] Other (See Comments) 04/03/2014   Neomycin-bacitracin zn-polymyx Rash     Family History  Problem Relation Age of Onset   Stroke Mother    Lung cancer Father     Social History   Socioeconomic History   Marital status: Single    Spouse name: Not on file   Number of children: Not on file   Years of education: Not on file   Highest education level: Not on file  Occupational History   Not on file  Tobacco Use   Smoking status: Former    Packs/day: 0.50    Years: 35.00    Additional pack years: 0.00    Total pack years: 17.50    Types: Cigarettes    Quit date: 10/26/2013    Years since quitting: 8.6   Smokeless tobacco: Never  Vaping Use   Vaping Use: Some days   Substances: Nicotine, Flavoring  Substance and Sexual Activity   Alcohol use: No    Alcohol/week: 0.0 standard drinks of alcohol    Comment: "  stopped drinking  in 2004"   Drug use: No    Types: "Crack" cocaine, Marijuana    Comment: 10/30/2013 "last crack in 2014; last marijuana in ~ 1990" drug free for last 3 years    Sexual activity:  Never  Other Topics Concern   Not on file  Social History Narrative   Not on file   Social Determinants of Health   Financial Resource Strain: Not on file  Food Insecurity: No Food Insecurity (06/25/2022)   Hunger Vital Sign    Worried About Running Out of Food in the Last Year: Never true    Ran Out of Food in the Last Year: Never true  Transportation Needs: No Transportation Needs (06/25/2022)   PRAPARE - Administrator, Civil Service (Medical): No    Lack of Transportation (Non-Medical): No  Physical Activity: Not on file  Stress: Not on file  Social Connections: Not on file  Intimate Partner Violence: Not At Risk (06/25/2022)   Humiliation, Afraid, Rape, and Kick questionnaire    Fear of Current or Ex-Partner: No    Emotionally Abused: No    Physically Abused: No    Sexually Abused: No    Review of Systems: As per HPI, all others negative  Physical Exam: Vital signs in last 24 hours: Temp:  [97.5 F (36.4 C)-99.3 F (37.4 C)] 98 F (36.7 C) (06/02 0700) Pulse Rate:  [50-138] 138 (06/02 1000) Resp:  [15-29] 20 (06/02 1000) BP: (112-194)/(58-107) 156/67 (06/02 1000) SpO2:  [80 %-100 %] 80 % (06/02 1000) Weight:  [161 kg] 108 kg (06/01 1358) Last BM Date : 06/24/22 General:   Alert,  overweight, chronically ill-appearing, older-appearing than stated age, pleasant and cooperative in NAD Head:  Normocephalic and atraumatic. Eyes:  Sclera clear, no icterus.   Conjunctiva pale Ears:  Normal auditory acuity. Nose:  No deformity, discharge,  or lesions. Mouth:  No deformity or lesions.  Oropharynx pale and dry Neck:  Supple; no masses or thyromegaly. Lungs:  No respiratory distress Abdomen:  Soft, protuberant, mild generalized distention, No masses, hepatosplenomegaly or hernias noted. Without guarding, and without rebound.     Msk:  Symmetrical without gross deformities. Normal posture. Pulses:  Normal pulses noted. Extremities:  Without clubbing or  edema. Neurologic:  Alert and  oriented x4;  grossly normal neurologically. Skin:  Intact without significant lesions or rashes. Psych:  Alert and cooperative. Normal mood and affect.   Lab Results: Recent Labs    06/24/22 1444 06/24/22 2129 06/25/22 0228  WBC 4.8  --  5.0  HGB 7.6* 8.4* 8.5*  HCT 27.2* 29.2* 29.1*  PLT 177  --  158   BMET Recent Labs    06/24/22 1444 06/25/22 0228  NA 137 137  K 3.6 3.7  CL 106 105  CO2 22 24  GLUCOSE 124* 130*  BUN 19 18  CREATININE 1.57* 1.45*  CALCIUM 8.5* 8.3*   LFT Recent Labs    06/25/22 0228  PROT 6.2*  ALBUMIN 3.2*  AST 22  ALT 23  ALKPHOS 83  BILITOT 0.5   PT/INR Recent Labs    06/24/22 1555  LABPROT 22.6*  INR 2.0*    Studies/Results: VAS [place] LOWER EXTREMITY VENOUS (DVT)  Result Date: 06/25/2022  Lower Venous DVT Study Patient Name:  [name]  Date of Exam:   06/25/2022 Medical Rec #: [id number]     Accession #:    [id number] Date of Birth: 1955/03/19    Patient Gender:  F Patient Age:   35 years Exam Location:  [facility] Procedure:      VAS [place] LOWER EXTREMITY VENOUS (DVT) Referring Phys: DAVID ORTIZ --------------------------------------------------------------------------------  Indications: Swelling.  Risk Factors: None identified. Limitations: Poor ultrasound/tissue interface, body habitus and patient movement. Comparison Study: No prior studies. Performing Technologist: Chanda Busing RVT  Examination Guidelines: A complete evaluation includes B-mode imaging, spectral Doppler, color Doppler, and power Doppler as needed of all accessible portions of each vessel. Bilateral testing is considered an integral part of a complete examination. Limited examinations for reoccurring indications may be performed as noted. The reflux portion of the exam is performed with the patient in reverse Trendelenburg.  +---------+---------------+---------+-----------+----------+--------------+ RIGHT     CompressibilityPhasicitySpontaneityPropertiesThrombus Aging +---------+---------------+---------+-----------+----------+--------------+ CFV      Full           Yes      Yes                                 +---------+---------------+---------+-----------+----------+--------------+ SFJ      Full                                                        +---------+---------------+---------+-----------+----------+--------------+ FV Prox  Full                                                        +---------+---------------+---------+-----------+----------+--------------+ FV Mid   Full                                                        +---------+---------------+---------+-----------+----------+--------------+ FV DistalFull                                                        +---------+---------------+---------+-----------+----------+--------------+ PFV      Full                                                        +---------+---------------+---------+-----------+----------+--------------+ POP      Full           Yes      Yes                                 +---------+---------------+---------+-----------+----------+--------------+ PTV      Full                                                        +---------+---------------+---------+-----------+----------+--------------+  PERO     Full                                                        +---------+---------------+---------+-----------+----------+--------------+   +---------+---------------+---------+-----------+----------+--------------+ LEFT     CompressibilityPhasicitySpontaneityPropertiesThrombus Aging +---------+---------------+---------+-----------+----------+--------------+ CFV      Full           Yes      Yes                                 +---------+---------------+---------+-----------+----------+--------------+ SFJ      Full                                                         +---------+---------------+---------+-----------+----------+--------------+ FV Prox  Full                                                        +---------+---------------+---------+-----------+----------+--------------+ FV Mid   Full                                                        +---------+---------------+---------+-----------+----------+--------------+ FV DistalFull                                                        +---------+---------------+---------+-----------+----------+--------------+ PFV      Full                                                        +---------+---------------+---------+-----------+----------+--------------+ POP      Full           Yes      Yes                                 +---------+---------------+---------+-----------+----------+--------------+ PTV      Full                                                        +---------+---------------+---------+-----------+----------+--------------+ PERO     Full                                                        +---------+---------------+---------+-----------+----------+--------------+  Summary: RIGHT: - There is no evidence of deep vein thrombosis in the lower extremity.  - No cystic structure found in the popliteal fossa.  LEFT: - There is no evidence of deep vein thrombosis in the lower extremity.  - No cystic structure found in the popliteal fossa.  *See table(s) above for measurements and observations.    Preliminary     Impression:   Anemia, acute blood loss. Melena. Per patient started only once she started iron therapy. Chronic anticoagulation, Xarelto, for DVT/PE. Cirrhosis with history of Hepatitis B on entecavir. Personal history of colon polyps.  Multiple medical problems. Recurrent dysphagia, reports history of prior esophageal dilatations.  Plan:   Hold Xarelto. CBC's, transfuse as needed. Follow INR; currently 2, hopefully will  drift down off Xarelto.  Do not see acute need for reversal. Soft diet, NPO after midnight. If INR tomorrow </= 1.8, will plan EGD +/- DIL +/- therapeutic bleeding intervention. Risks (bleeding, infection, bowel perforation that could require surgery, sedation-related changes in cardiopulmonary systems), benefits (identification and possible treatment of source of symptoms, exclusion of certain causes of symptoms), and alternatives (watchful waiting, radiographic imaging studies, empiric medical treatment) of upper endoscopy (EGD) were explained to patient/family in detail and patient wishes to proceed.  [facility] will follow.   LOS: 1 day   Raydan Schlabach M  06/25/2022, 10:31 AM  Cell [phone] If no answer or after 5 PM call [phone]

## 2022-06-25 NOTE — Anesthesia Preprocedure Evaluation (Signed)
Anesthesia Evaluation  Patient identified by MRN, date of birth, ID band Patient awake    Reviewed: Allergy & Precautions, NPO status , Patient's Chart, lab work & pertinent test results  History of Anesthesia Complications Negative for: history of anesthetic complications  Airway Mallampati: III  TM Distance: >3 FB Neck ROM: Full    Dental  (+) Dental Advisory Given   Pulmonary neg shortness of breath, neg sleep apnea, COPD,  COPD inhaler, Recent URI , Residual Cough, Patient abstained from smoking., former smoker, PE (09/2013)   Pulmonary exam normal breath sounds clear to auscultation       Cardiovascular hypertension, Pt. on home beta blockers (-) angina + CAD, +CHF and + DVT (09/2013)  (-) Past MI, (-) Cardiac Stents and (-) CABG + Valvular Problems/Murmurs  Rhythm:Regular Rate:Normal     Neuro/Psych neg Seizures PSYCHIATRIC DISORDERS (agorabphobia with panic attacks) Anxiety Depression Bipolar Disorder   Cervical spine stenosis    GI/Hepatic ,GERD  Medicated,,(+) Cirrhosis       , Hepatitis -, B, C  Endo/Other  neg diabetes  Morbid obesity  Renal/GU CRFRenal disease     Musculoskeletal  (+) Arthritis ,    Abdominal  (+) + obese  Peds  Hematology  (+) Blood dyscrasia, anemia   Anesthesia Other Findings Last Xarelto: 06/24/2022  INR 1.2  Reproductive/Obstetrics                             Anesthesia Physical Anesthesia Plan  ASA: 3  Anesthesia Plan: MAC   Post-op Pain Management:    Induction: Intravenous  PONV Risk Score and Plan: 2 and Propofol infusion and Treatment may vary due to age or medical condition  Airway Management Planned: Natural Airway and Simple Face Mask  Additional Equipment:   Intra-op Plan:   Post-operative Plan:   Informed Consent: I have reviewed the patients History and Physical, chart, labs and discussed the procedure including the risks,  benefits and alternatives for the proposed anesthesia with the patient or authorized representative who has indicated his/her understanding and acceptance.     Dental advisory given  Plan Discussed with: CRNA and Anesthesiologist  Anesthesia Plan Comments: (Preop albuterol nebulizer given.  Discussed with patient risks of MAC including, but not limited to, minor pain or discomfort, hearing people in the room, and possible need for backup general anesthesia. Risks for general anesthesia also discussed including, but not limited to, sore throat, hoarse voice, chipped/damaged teeth, injury to vocal cords, nausea and vomiting, allergic reactions, lung infection, heart attack, stroke, and death. All questions answered. )       Anesthesia Quick Evaluation

## 2022-06-25 NOTE — Assessment & Plan Note (Signed)
Continue Coreg As needed intravenous antihypertensives for markedly elevated blood pressures.

## 2022-06-25 NOTE — Assessment & Plan Note (Signed)
Strict intake and output monitoring Creatinine near baseline Minimizing nephrotoxic agents as much as possible Serial chemistries to monitor renal function and electrolytes  

## 2022-06-25 NOTE — Assessment & Plan Note (Addendum)
Patient presenting with substantial anemia, suspected to be secondary to chronic blood loss in a patient with known cirrhosis on chronic anticoagulation Patient is status post 1 unit packed red blood cell transfusion during his hospitalization with hemoglobin incrementing appropriately He will gastroenterology consulted, their input is appreciated.  They plan to proceed with EGD morning of 6/3. Continue prophylactic ceftriaxone considering possible bleeding in the setting of cirrhosis. Continue Protonix, continue octreotide Continue to monitor hemoglobin and hematocrit with serial CBCs

## 2022-06-25 NOTE — Plan of Care (Signed)
Plan of care and general admission reviewed with patient, time given for Q&A, patient oriented to room and bed controls. Orders carried out, pain medication given for her complaints of pain.  Problem: Education: Goal: Knowledge of General Education information will improve Description: Including pain rating scale, medication(s)/side effects and non-pharmacologic comfort measures Outcome: Progressing   Problem: Health Behavior/Discharge Planning: Goal: Ability to manage health-related needs will improve Outcome: Progressing   Problem: Clinical Measurements: Goal: Ability to maintain clinical measurements within normal limits will improve Outcome: Progressing Goal: Will remain free from infection Outcome: Progressing Goal: Diagnostic test results will improve Outcome: Progressing Goal: Respiratory complications will improve Outcome: Progressing Goal: Cardiovascular complication will be avoided Outcome: Progressing   Problem: Activity: Goal: Risk for activity intolerance will decrease Outcome: Progressing   Problem: Nutrition: Goal: Adequate nutrition will be maintained Outcome: Progressing   Problem: Coping: Goal: Level of anxiety will decrease Outcome: Progressing   Problem: Elimination: Goal: Will not experience complications related to bowel motility Outcome: Progressing Goal: Will not experience complications related to urinary retention Outcome: Progressing   Problem: Pain Managment: Goal: General experience of comfort will improve Outcome: Progressing   Problem: Safety: Goal: Ability to remain free from injury will improve Outcome: Progressing   Problem: Skin Integrity: Goal: Risk for impaired skin integrity will decrease Outcome: Progressing   Problem: Education: Goal: Ability to identify signs and symptoms of gastrointestinal bleeding will improve Outcome: Progressing   Problem: Bowel/Gastric: Goal: Will show no signs and symptoms of gastrointestinal  bleeding Outcome: Progressing   Problem: Fluid Volume: Goal: Will show no signs and symptoms of excessive bleeding Outcome: Progressing   Problem: Clinical Measurements: Goal: Complications related to the disease process, condition or treatment will be avoided or minimized Outcome: Progressing

## 2022-06-25 NOTE — Assessment & Plan Note (Addendum)
Echocardiogram revealing grade 1 diastolic dysfunction with preserved ejection fraction No clinical evidence of acute congestive heart failure

## 2022-06-25 NOTE — Hospital Course (Addendum)
67 year old female with past medical history of GERD, anxiety disorder, depression, agoraphobia, bipolar disorder, chronic kidney disease stage IIIb, cocaine abuse (remote), COPD hypertension, vitamin D deficiency hepatitis B cirrhosis (on Entecavir, follows with [facility] Transplant clinic), prior pulmonary embolism (on Xarelto), iron deficiency anemia, diastolic congestive heart failure (Echo 2017 EF 55-60% with G1DD) presenting to [facility] emergency department by her primary care provider due to having a low hemoglobin of 7.6.  Upon evaluation in the emergency department patient.  Considering patient's history of cirrhosis patient was initiated on intravenous Protonix, octreotide and ceftriaxone.  1 unit of packed red blood cells was transfused.  The hospitalist group was then called to assess the patient for admission to the hospital.  [facility] gastroenterology was contacted for consultation and patient was admitted to the stepdown unit under the hospitalist service.  Patient Xarelto was held and after monitoring patient's hemoglobin and hematocrit overnight the patient eventually went for upper endoscopy on 6/3 with Dr. Ewing Schlein.  The endoscopy revealed gastritis but without any evidence of active bleeding or ulcerations that would serve as a source..  In the following 24 hours the patient's octreotide infusion was weaned and turned off.    Patient's history was reviewed.  Considering patient has had pulmonary emboli on 3 separate occasions it was felt that the patient must remain on Xarelto and it cannot be discontinued.  This was therefore initiated and the patient was monitored until 06/27/2022 with a stable hemoglobin and hematocrit and no evidence of recurrent bleeding.  Of note, also during this hospitalization patient was placed back on a reduced dose of her usual regimen of spironolactone at 25 mg daily.  Apparently this was discontinued in the outpatient setting recently.  Furthermore the patient  was noted to be quite hypertensive throughout the hospitalization.  Her usual regimen of carvedilol was increased to 12.5 mg twice daily.  The patient was discharged home in improved and stable condition on 06/27/2022.  Patient was advised to follow-up closely with her primary care provider as well as following up as an outpatient with gastroenterology.  She reported that she wished to follow-up with a gastroenterologist that saw her during this hospitalization and therefore she plans to follow-up with [facility] gastroenterology in the next several weeks.

## 2022-06-26 ENCOUNTER — Inpatient Hospital Stay (HOSPITAL_COMMUNITY): Payer: 59

## 2022-06-26 ENCOUNTER — Encounter (HOSPITAL_COMMUNITY)
Admission: EM | Disposition: A | Discharge status: Home / Self Care | Payer: Self-pay | Source: Home / Self Care | Attending: Internal Medicine

## 2022-06-26 ENCOUNTER — Inpatient Hospital Stay (HOSPITAL_COMMUNITY): Payer: 59 | Admitting: Anesthesiology

## 2022-06-26 ENCOUNTER — Encounter (HOSPITAL_COMMUNITY): Payer: Self-pay | Admitting: Internal Medicine

## 2022-06-26 ENCOUNTER — Ambulatory Visit: Payer: 59 | Admitting: Surgical

## 2022-06-26 ENCOUNTER — Telehealth: Payer: Self-pay

## 2022-06-26 DIAGNOSIS — J449 Chronic obstructive pulmonary disease, unspecified: Secondary | ICD-10-CM

## 2022-06-26 DIAGNOSIS — K2920 Alcoholic gastritis without bleeding: Secondary | ICD-10-CM

## 2022-06-26 DIAGNOSIS — K224 Dyskinesia of esophagus: Secondary | ICD-10-CM

## 2022-06-26 DIAGNOSIS — I5031 Acute diastolic (congestive) heart failure: Secondary | ICD-10-CM

## 2022-06-26 DIAGNOSIS — D500 Iron deficiency anemia secondary to blood loss (chronic): Secondary | ICD-10-CM | POA: Diagnosis not present

## 2022-06-26 DIAGNOSIS — B181 Chronic viral hepatitis B without delta-agent: Secondary | ICD-10-CM | POA: Diagnosis not present

## 2022-06-26 DIAGNOSIS — K922 Gastrointestinal hemorrhage, unspecified: Secondary | ICD-10-CM | POA: Diagnosis not present

## 2022-06-26 DIAGNOSIS — I10 Essential (primary) hypertension: Secondary | ICD-10-CM | POA: Diagnosis not present

## 2022-06-26 DIAGNOSIS — Z87891 Personal history of nicotine dependence: Secondary | ICD-10-CM

## 2022-06-26 HISTORY — PX: BIOPSY: SHX5522

## 2022-06-26 HISTORY — PX: ESOPHAGOGASTRODUODENOSCOPY (EGD) WITH PROPOFOL: SHX5813

## 2022-06-26 LAB — COMPREHENSIVE METABOLIC PANEL
ALT: 20 U/L (ref 0–44)
AST: 19 U/L (ref 15–41)
Albumin: 3.1 g/dL — ABNORMAL LOW (ref 3.5–5.0)
Alkaline Phosphatase: 83 U/L (ref 38–126)
Anion gap: 7 (ref 5–15)
BUN: 16 mg/dL (ref 8–23)
CO2: 25 mmol/L (ref 22–32)
Calcium: 8.1 mg/dL — ABNORMAL LOW (ref 8.9–10.3)
Chloride: 104 mmol/L (ref 98–111)
Creatinine, Ser: 1.35 mg/dL — ABNORMAL HIGH (ref 0.44–1.00)
GFR, Estimated: 44 mL/min — ABNORMAL LOW (ref >60)
Glucose, Bld: 119 mg/dL — ABNORMAL HIGH (ref 70–99)
Potassium: 3.5 mmol/L (ref 3.5–5.1)
Sodium: 136 mmol/L (ref 135–145)
Total Bilirubin: 0.4 mg/dL (ref 0.3–1.2)
Total Protein: 6.1 g/dL — ABNORMAL LOW (ref 6.5–8.1)

## 2022-06-26 LAB — CBC WITH DIFFERENTIAL/PLATELET
Abs Immature Granulocytes: 0.03 10*3/uL (ref 0.00–0.07)
Basophils Absolute: 0 10*3/uL (ref 0.0–0.1)
Basophils Relative: 0 %
Eosinophils Absolute: 0.1 10*3/uL (ref 0.0–0.5)
Eosinophils Relative: 1 %
HCT: 29.8 % — ABNORMAL LOW (ref 36.0–46.0)
Hemoglobin: 8.5 g/dL — ABNORMAL LOW (ref 12.0–15.0)
Immature Granulocytes: 1 %
Lymphocytes Relative: 21 %
Lymphs Abs: 1.2 10*3/uL (ref 0.7–4.0)
MCH: 23.5 pg — ABNORMAL LOW (ref 26.0–34.0)
MCHC: 28.5 g/dL — ABNORMAL LOW (ref 30.0–36.0)
MCV: 82.5 fL (ref 80.0–100.0)
Monocytes Absolute: 0.3 10*3/uL (ref 0.1–1.0)
Monocytes Relative: 6 %
Neutro Abs: 4.1 10*3/uL (ref 1.7–7.7)
Neutrophils Relative %: 71 %
Platelets: 154 10*3/uL (ref 150–400)
RBC: 3.61 MIL/uL — ABNORMAL LOW (ref 3.87–5.11)
RDW: 17.2 % — ABNORMAL HIGH (ref 11.5–15.5)
WBC: 5.8 10*3/uL (ref 4.0–10.5)
nRBC: 0 % (ref 0.0–0.2)

## 2022-06-26 LAB — FOLATE: Folate: 6.6 ng/mL (ref >5.9)

## 2022-06-26 LAB — FERRITIN: Ferritin: 8 ng/mL — ABNORMAL LOW (ref 11–307)

## 2022-06-26 LAB — CBC
HCT: 28.9 % — ABNORMAL LOW (ref 36.0–46.0)
Hemoglobin: 8.3 g/dL — ABNORMAL LOW (ref 12.0–15.0)
MCH: 23.9 pg — ABNORMAL LOW (ref 26.0–34.0)
MCHC: 28.7 g/dL — ABNORMAL LOW (ref 30.0–36.0)
MCV: 83.3 fL (ref 80.0–100.0)
Platelets: 149 10*3/uL — ABNORMAL LOW (ref 150–400)
RBC: 3.47 MIL/uL — ABNORMAL LOW (ref 3.87–5.11)
RDW: 17.2 % — ABNORMAL HIGH (ref 11.5–15.5)
WBC: 6.1 10*3/uL (ref 4.0–10.5)
nRBC: 0 % (ref 0.0–0.2)

## 2022-06-26 LAB — IRON AND TIBC
Iron: 34 ug/dL (ref 28–170)
Saturation Ratios: 9 % — ABNORMAL LOW (ref 10.4–31.8)
TIBC: 391 ug/dL (ref 250–450)
UIBC: 357 ug/dL

## 2022-06-26 LAB — APTT: aPTT: 28 seconds (ref 24–36)

## 2022-06-26 LAB — ECHOCARDIOGRAM COMPLETE
AR max vel: 2.26 cm2
AV Area VTI: 2.25 cm2
AV Area mean vel: 2.18 cm2
AV Mean grad: 8 mmHg
AV Peak grad: 15.8 mmHg
Ao pk vel: 1.99 m/s
Area-P 1/2: 2.54 cm2
Calc EF: 68.5 %
Height: 64 in
MV VTI: 2.57 cm2
S' Lateral: 2.4 cm
Single Plane A2C EF: 70.8 %
Single Plane A4C EF: 69.6 %
Weight: 4182.4 oz

## 2022-06-26 LAB — PROTIME-INR
INR: 1.2 (ref 0.8–1.2)
Prothrombin Time: 15.4 seconds — ABNORMAL HIGH (ref 11.4–15.2)

## 2022-06-26 LAB — RETICULOCYTES
Immature Retic Fract: 26.3 % — ABNORMAL HIGH (ref 2.3–15.9)
RBC.: 3.57 MIL/uL — ABNORMAL LOW (ref 3.87–5.11)
Retic Count, Absolute: 76.4 10*3/uL (ref 19.0–186.0)
Retic Ct Pct: 2.1 % (ref 0.4–3.1)

## 2022-06-26 LAB — BPAM RBC: Unit Type and Rh: 1700

## 2022-06-26 LAB — MAGNESIUM: Magnesium: 1.8 mg/dL (ref 1.7–2.4)

## 2022-06-26 LAB — TYPE AND SCREEN
ABO/RH(D): B NEG
Antibody Screen: NEGATIVE
Unit division: 0

## 2022-06-26 LAB — VITAMIN B12: Vitamin B-12: 317 pg/mL (ref 180–914)

## 2022-06-26 SURGERY — ESOPHAGOGASTRODUODENOSCOPY (EGD) WITH PROPOFOL
Anesthesia: Monitor Anesthesia Care

## 2022-06-26 MED ORDER — LACTATED RINGERS IV SOLN
INTRAVENOUS | Status: DC
  Administered 2022-06-26: 1000 mL via INTRAVENOUS

## 2022-06-26 MED ORDER — IOHEXOL 9 MG/ML PO SOLN
ORAL | Status: AC
  Filled 2022-06-26: qty 1000

## 2022-06-26 MED ORDER — PROPOFOL 500 MG/50ML IV EMUL
INTRAVENOUS | Status: AC
  Filled 2022-06-26: qty 50

## 2022-06-26 MED ORDER — ALBUTEROL SULFATE (2.5 MG/3ML) 0.083% IN NEBU
2.5000 mg | INHALATION_SOLUTION | Freq: Once | RESPIRATORY_TRACT | Status: AC
  Administered 2022-06-26: 2.5 mg via RESPIRATORY_TRACT

## 2022-06-26 MED ORDER — MIDAZOLAM HCL 2 MG/2ML IJ SOLN
INTRAMUSCULAR | Status: DC | PRN
  Administered 2022-06-26 (×2): .5 mg via INTRAVENOUS

## 2022-06-26 MED ORDER — MIDAZOLAM HCL 2 MG/2ML IJ SOLN
INTRAMUSCULAR | Status: AC
  Filled 2022-06-26: qty 2

## 2022-06-26 MED ORDER — SODIUM CHLORIDE 0.9 % IV SOLN
25.0000 ug/h | INTRAVENOUS | Status: AC
  Administered 2022-06-26: 25 ug/h via INTRAVENOUS
  Filled 2022-06-26: qty 1

## 2022-06-26 MED ORDER — BUTAMBEN-TETRACAINE-BENZOCAINE 2-2-14 % EX AERO
INHALATION_SPRAY | CUTANEOUS | Status: DC | PRN
  Administered 2022-06-26: 2 via TOPICAL

## 2022-06-26 MED ORDER — PHENYLEPHRINE 80 MCG/ML (10ML) SYRINGE FOR IV PUSH (FOR BLOOD PRESSURE SUPPORT)
PREFILLED_SYRINGE | INTRAVENOUS | Status: DC | PRN
  Administered 2022-06-26: 80 ug via INTRAVENOUS

## 2022-06-26 MED ORDER — RIVAROXABAN 20 MG PO TABS
20.0000 mg | ORAL_TABLET | Freq: Every day | ORAL | Status: DC
  Administered 2022-06-26: 20 mg via ORAL
  Filled 2022-06-26: qty 1

## 2022-06-26 MED ORDER — RIVAROXABAN 20 MG PO TABS: 20.0000 mg | ORAL_TABLET | Freq: Every day | ORAL | Status: DC

## 2022-06-26 MED ORDER — DEXMEDETOMIDINE HCL IN NACL 80 MCG/20ML IV SOLN
INTRAVENOUS | Status: DC | PRN
  Administered 2022-06-26: 8 ug via INTRAVENOUS

## 2022-06-26 MED ORDER — IOHEXOL 9 MG/ML PO SOLN
500.0000 mL | ORAL | Status: AC
  Administered 2022-06-26 (×2): 500 mL via ORAL

## 2022-06-26 MED ORDER — SODIUM CHLORIDE 0.9 % IV SOLN: INTRAVENOUS | Status: DC

## 2022-06-26 MED ORDER — PANTOPRAZOLE SODIUM 40 MG PO TBEC
40.0000 mg | DELAYED_RELEASE_TABLET | Freq: Every day | ORAL | Status: DC
  Administered 2022-06-26 – 2022-06-27 (×2): 40 mg via ORAL
  Filled 2022-06-26 (×2): qty 1

## 2022-06-26 MED ORDER — PROPOFOL 500 MG/50ML IV EMUL
INTRAVENOUS | Status: DC | PRN
  Administered 2022-06-26: 150 ug/kg/min via INTRAVENOUS

## 2022-06-26 MED ORDER — GLYCOPYRROLATE PF 0.2 MG/ML IJ SOSY
PREFILLED_SYRINGE | INTRAMUSCULAR | Status: DC | PRN
  Administered 2022-06-26: .2 mg via INTRAVENOUS

## 2022-06-26 MED ORDER — LIDOCAINE 2% (20 MG/ML) 5 ML SYRINGE
INTRAMUSCULAR | Status: DC | PRN
  Administered 2022-06-26: 60 mg via INTRAVENOUS

## 2022-06-26 MED ORDER — PROPOFOL 10 MG/ML IV BOLUS
INTRAVENOUS | Status: DC | PRN
  Administered 2022-06-26: 50 mg via INTRAVENOUS

## 2022-06-26 MED ORDER — ALBUTEROL SULFATE (2.5 MG/3ML) 0.083% IN NEBU
INHALATION_SOLUTION | RESPIRATORY_TRACT | Status: AC
  Filled 2022-06-26: qty 3

## 2022-06-26 SURGICAL SUPPLY — 15 items

## 2022-06-26 NOTE — Anesthesia Postprocedure Evaluation (Signed)
Anesthesia Post Note  Patient: [name]  Procedure(s) Performed: ESOPHAGOGASTRODUODENOSCOPY (EGD) WITH PROPOFOL     Patient location during evaluation: PACU Anesthesia Type: MAC Level of consciousness: awake Pain management: pain level controlled Vital Signs Assessment: post-procedure vital signs reviewed and stable Respiratory status: spontaneous breathing, nonlabored ventilation and respiratory function stable Cardiovascular status: stable and blood pressure returned to baseline Postop Assessment: no apparent nausea or vomiting Anesthetic complications: no   No notable events documented.  Last Vitals:  Vitals:   06/26/22 0830 06/26/22 0840  BP: (!) 166/79 (!) 149/78  Pulse: 89 89  Resp: 16 15  Temp:    SpO2: 100% 100%    Last Pain:  Vitals:   06/26/22 0840  TempSrc:   PainSc: 4                  Linton Rump

## 2022-06-26 NOTE — Progress Notes (Signed)
PROGRESS NOTE   [name]  ZOX:[id number] DOB: 1955-07-22 DOA: 06/24/2022 PCP: Tracey Harries, MD   Date of Service: the patient was seen and examined on 06/26/2022  Brief Narrative:  67 year old female with past medical history of GERD, anxiety disorder, depression, agoraphobia, bipolar disorder, chronic kidney disease stage IIIb, cocaine abuse (remote), COPD hypertension, vitamin D deficiency hepatitis B cirrhosis (on Entecavir, follows with [facility] Transplant clinic), prior pulmonary embolism (on Xarelto), iron deficiency anemia, diastolic congestive heart failure (Echo 2017 EF 55-60% with G1DD) presenting to [facility] emergency department by her primary care provider due to having a low hemoglobin of 7.6.  Upon evaluation in the emergency department patient.  Considering patient's history of cirrhosis patient was initiated on intravenous Protonix, octreotide and ceftriaxone.  1 unit of packed red blood cells was transfused.  The hospitalist group was then called to assess the patient for admission to the hospital.  [facility] gastroenterology was contacted for consultation and patient was admitted to the stepdown unit under the hospitalist service.     Assessment and Plan: Iron deficiency anemia due to chronic blood loss Hemoglobin and hematocrit continue to be stable without any further evidence of bleeding.   Monitoring hemoglobin and hematocrit with serial CBCs. Case discussed with Dr. Ewing Schlein with gastroenterology, no obvious source of bleeding on EGD performed earlier today  patient is status post 1 unit packed red blood cell transfusion  Weaning off octreotide today Continue prophylactic ceftriaxone considering possible bleeding in the setting of cirrhosis. Transitioning patient from intravenous to oral Protonix.  Essential hypertension Continue Coreg As needed intravenous antihypertensives for markedly elevated blood pressures.  Chronic hepatitis B with cirrhosis  (HCC) Continue longstanding regimen of Entecavir Supportive care  Chronic kidney disease, stage 3b (HCC) Strict intake and output monitoring Creatinine near baseline Minimizing nephrotoxic agents as much as possible Serial chemistries to monitor renal function and electrolytes   COPD (chronic obstructive pulmonary disease) (HCC) No evidence of COPD exacerbation this time Continue home regimen of maintenance inhalers As needed bronchodilator therapy for episodic shortness of breath and wheezing.   Generalized anxiety disorder Continue scheduled Xanax Regimen has been confirmed via controlled substance database.  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   Will resume Xarelto and monitor overnight for bleeding complications.    Subjective:  Patient denies abdominal pain, denies melena or blood in the stool.  Patient denies chest pain or shortness of breath.  Patient does complain of cough wheezing and mild shortness of breath however.  Physical Exam:  Vitals:   06/26/22 0921 06/26/22 1231 06/26/22 1809 06/26/22 2101  BP: (!) 163/84 (!) 171/91  (!) 152/87  Pulse: 87 88  88  Resp: 18 20  (!) 22  Temp: 98.2 F (36.8 C) 97.9 F (36.6 C)  99.1 F (37.3 C)  TempSrc: Oral Oral  Oral  SpO2: 95% 100% 98% 100%  Weight:      Height:        Constitutional: Awake alert and oriented x3, no associated distress.   Skin: no rashes, no lesions, poor skin turgor noted. Eyes: Pupils are equally reactive to light.  No evidence of scleral icterus or conjunctival pallor.  ENMT: Moist mucous membranes noted.  Posterior pharynx clear of any exudate or lesions.   Respiratory: Mild intermittent end-expiratory wheezing.  No evidence  of rales.  Normal  respiratory effort. No accessory muscle use.  Cardiovascular: Regular rate and rhythm, no murmurs / rubs / gallops. No extremity edema. 2+ pedal pulses. No carotid bruits.  Abdomen: Abdomen protuberant but soft and nontender.  No evidence of intra-abdominal masses.  Positive bowel sounds noted in all quadrants.   Musculoskeletal: No joint deformity upper and lower extremities. Good ROM, no contractures. Normal muscle tone.    Data Reviewed:  I have personally reviewed and interpreted labs, imaging.  Significant findings are   CBC: Recent Labs  Lab 06/24/22 1444 06/24/22 2129 06/25/22 0228 06/25/22 1541 06/26/22 0451 06/26/22 1558  WBC 4.8  --  5.0 6.5 5.8 6.1  NEUTROABS  --   --   --   --  4.1  --   HGB 7.6* 8.4* 8.5* 9.1* 8.5* 8.3*  HCT 27.2* 29.2* 29.1* 32.1* 29.8* 28.9*  MCV 82.2  --  81.7 83.6 82.5 83.3  PLT 177  --  158 161 154 149*   Basic Metabolic Panel: Recent Labs  Lab 06/24/22 1444 06/25/22 0228 06/26/22 0451  NA 137 137 136  K 3.6 3.7 3.5  CL 106 105 104  CO2 22 24 25   GLUCOSE 124* 130* 119*  BUN 19 18 16   CREATININE 1.57* 1.45* 1.35*  CALCIUM 8.5* 8.3* 8.1*  MG  --   --  1.8   GFR: Estimated Creatinine Clearance: 52.7 mL/min (A) (by C-G formula based on SCr of 1.35 mg/dL (H)). Liver Function Tests: Recent Labs  Lab 06/24/22 1444 06/25/22 0228 06/26/22 0451  AST 39 22 19  ALT 25 23 20   ALKPHOS 80 83 83  BILITOT 0.9 0.5 0.4  PROT 6.2* 6.2* 6.1*  ALBUMIN 3.2* 3.2* 3.1*    Coagulation Profile: Recent Labs  Lab 06/24/22 1555 06/26/22 0451  INR 2.0* 1.2     EKG/Telemetry: Personally reviewed.  Rhythm is normal sinus rhythm with heart rate of 85.  No dynamic ST segment changes appreciated.   Code Status:  Full code.  Code status decision has been confirmed with: patient   Severity of Illness:  The appropriate patient status for this patient is INPATIENT. Inpatient status is judged to be reasonable and necessary in order to provide the  required intensity of service to ensure the patient's safety. The patient's presenting symptoms, physical exam findings, and initial radiographic and laboratory data in the context of their chronic comorbidities is felt to place them at high risk for further clinical deterioration. Furthermore, it is not anticipated that the patient will be medically stable for discharge from the hospital within 2 midnights of admission.   * I certify that at the point of admission it is my clinical judgment that the patient will require inpatient hospital care spanning beyond 2 midnights from the point of admission due to high intensity of service, high risk for further deterioration and high frequency of surveillance required.*  Time spent:  50 minutes  Author:  Marinda Elk MD  06/26/2022

## 2022-06-26 NOTE — Telephone Encounter (Signed)
Patient called to let us know that she will call to R/S her appt.once she is out of the hospital.

## 2022-06-26 NOTE — Op Note (Signed)
[facility] Patient Name: [name] Procedure Date: 06/26/2022 MRN: [id number] Attending MD: Vida Rigger , MD, [id number] Date of Birth: [birth year] CSN: [id number] Age: 67 Admit Type: Inpatient Procedure:                Upper GI endoscopy Indications:              Iron deficiency anemia secondary to chronic blood                            loss, Iron deficiency anemia Providers:                Vida Rigger, MD, Martha Clan, RN, Marge Duncans, RN, Sunday Corn Mbumina, Technician Referring MD:              Medicines:                Monitored Anesthesia Care Complications:            No immediate complications. Estimated Blood Loss:     Estimated blood loss: none. Procedure:                Pre-Anesthesia Assessment:                           - Prior to the procedure, a History and Physical                            was performed, and patient medications and                            allergies were reviewed. The patient's tolerance of                            previous anesthesia was also reviewed. The risks                            and benefits of the procedure and the sedation                            options and risks were discussed with the patient.                            All questions were answered, and informed consent                            was obtained. Prior Anticoagulants: The patient has                            taken Xarelto (rivaroxaban), last dose was 2 days                            prior to procedure. ASA Grade Assessment: III - A  patient with severe systemic disease. After                            reviewing the risks and benefits, the patient was                            deemed in satisfactory condition to undergo the                            procedure.                           After obtaining informed consent, the endoscope was                            passed under direct  vision. Throughout the                            procedure, the patient's blood pressure, pulse, and                            oxygen saturations were monitored continuously. The                            [id number]-[id number] ([id number]) Olympus endoscope was introduced                            through the mouth, and advanced to the second part                            of duodenum. The upper GI endoscopy was                            accomplished without difficulty. The patient                            tolerated the procedure well. Scope In: Scope Out: Findings:      The larynx was normal. Biopsies for histology were taken with a cold       forceps for evaluation of celiac disease. Biopsies for histology were       taken with a cold forceps for evaluation of celiac disease.      Abnormal motility was noted in the distal esophagus. There is spasticity       of the esophageal body. The distal esophagus/lower esophageal sphincter       is open.      Patchy mild inflammation characterized by erosions and erythema was       found in the gastric antrum.      The duodenal bulb, first portion of the duodenum and second portion of       the duodenum were normal.      The exam was otherwise without abnormality. Except possibly some very       early patchy portal gastropathy and some minimal residual food in the       stomach some of which could be washed and suctioned Impression:               -  Normal larynx.                           - Abnormal esophageal motility, suspicious for                            presbyesophagus.                           - Gastritis.                           - Normal duodenal bulb, first portion of the                            duodenum and second portion of the duodenum.                           - The examination was otherwise normal. Moderate Sedation:      Not Applicable - Patient had care per Anesthesia. Recommendation:           - Soft diet today. Consider a  oral only CT of the                            abdomen and pelvis to rule out anything significant                            and reevaluate her liver                           - Continue present medications. Reevaluate Xarelto                            needs                           - Return to GI clinic at appointment to be                            scheduled. She does have a primary                            gastroenterologist who could complete the workup                           - Telephone GI clinic if symptomatic PRN. And                            please let us know if we can be of any further                            assistance with this hospital stay Procedure Code(s):        --- Professional ---                           [phone], Esophagogastroduodenoscopy, flexible,  transoral; with biopsy, single or multiple Diagnosis Code(s):        --- Professional ---                           K22.4, Dyskinesia of esophagus                           K29.70, Gastritis, unspecified, without bleeding                           D50.0, Iron deficiency anemia secondary to blood                            loss (chronic)                           D50.9, Iron deficiency anemia, unspecified CPT copyright 2022 American Medical Association. All rights reserved. The codes documented in this report are preliminary and upon coder review may  be revised to meet current compliance requirements. Vida Rigger, MD 06/26/2022 8:17:59 AM This report has been signed electronically. Number of Addenda: 0

## 2022-06-26 NOTE — TOC Initial Note (Signed)
Transition of Care [facility]) - Initial/Assessment Note    Patient Details  Name: [name] MRN: [id number] Date of Birth: 1955-11-12  Transition of [facility]) CM/SW Contact:    Lanier Clam, RN Phone Number: 06/26/2022, 12:00 PM  Clinical Narrative: Gi following. Monitor for d/c needs.                  Expected Discharge Plan: Home/Self Care Barriers to Discharge: Continued Medical Work up   Patient Goals and CMS Choice Patient states their goals for this hospitalization and ongoing recovery are:: Home CMS Medicare.gov Compare Post Acute Care list provided to:: Patient   [place] ownership interest in [facility].provided to:: Patient    Expected Discharge Plan and Services   Discharge Planning Services: CM Consult   Living arrangements for the past 2 months: Single Family Home                                      Prior Living Arrangements/Services Living arrangements for the past 2 months: Single Family Home Lives with:: Adult Children Patient language and need for interpreter reviewed:: Yes        Need for Family Participation in Patient Care: Yes (Comment) Care giver support system in place?: Yes (comment)   Criminal Activity/Legal Involvement Pertinent to Current Situation/Hospitalization: No - Comment as needed  Activities of Daily Living Home Assistive Devices/Equipment: Other (Comment) (Cane) ADL Screening (condition at time of admission) Patient's cognitive ability adequate to safely complete daily activities?: Yes Is the patient deaf or have difficulty hearing?: No Does the patient have difficulty seeing, even when wearing glasses/contacts?: No Does the patient have difficulty concentrating, remembering, or making decisions?: Yes Patient able to express need for assistance with ADLs?: Yes Does the patient have difficulty dressing or bathing?: Yes Independently performs ADLs?: Yes (appropriate for developmental age) Does the patient  have difficulty walking or climbing stairs?: Yes Weakness of Legs: Both Weakness of Arms/Hands: None  Permission Sought/Granted   Permission granted to share information with : Yes, Verbal Permission Granted  Share Information with NAME: Case manager           Emotional Assessment Appearance:: Appears stated age Attitude/Demeanor/Rapport: Gracious Affect (typically observed): Accepting Orientation: : Oriented to Self, Oriented to Place, Oriented to  Time, Oriented to Situation Alcohol / Substance Use: Not Applicable Psych Involvement: No (comment)  Admission diagnosis:  UGI bleed [K92.2] Upper GI bleed [K92.2] Patient Active Problem List   Diagnosis Date Noted   Chronic diastolic CHF (congestive heart failure) (HCC) 06/25/2022   UGI bleed 06/24/2022   ABLA (acute blood loss anemia) 06/24/2022   Severe sepsis with acute organ dysfunction (HCC) 01/04/2020   Primary osteoarthritis of right knee 12/04/2019   Primary osteoarthritis of left knee 12/04/2019   Vitamin D deficiency 09/20/2019   Cellulitis of foot 09/18/2019   Aspiration pneumonia of both lower lobes due to gastric secretions (HCC) 06/02/2019   Lumbar disc herniation 05/04/2019   Encounter for colorectal cancer screening 04/19/2019   Acute respiratory distress 04/06/2019   Hyperglycemia 04/06/2019   Pleural effusion on right 04/06/2019   Benzodiazepine dependence (HCC) 02/16/2019   Cervical stenosis of spinal canal 02/15/2019   Streptococcal sepsis (HCC) 01/22/2019   History of sepsis 01/22/2019   Acute bronchitis due to Rhinovirus 11/06/2018   Lung collapse 11/01/2018   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 08/13/2018  Compression fracture of body of thoracic vertebra (HCC) 08/01/2018   Closed nondisplaced fracture of greater tuberosity of right humerus 07/08/2018   On rivaroxaban therapy 07/08/2018   Chronic anticoagulation 05/27/2018   Falls frequently 05/27/2018   History  of DVT (deep vein thrombosis) 03/16/2018   History of pulmonary embolism 03/10/2018   Left lower quadrant abdominal pain 03/10/2018   Chronic pain syndrome 03/05/2018   COPD exacerbation (HCC) 06/09/2016   Abnormal urine odor 09/26/2015   Recurrent pulmonary embolism (HCC) 06/26/2015   Lupus anticoagulant positive 06/26/2015   Thrombocytopenia (HCC) 05/31/2015   UTI (lower urinary tract infection) 05/26/2015   Right flank pain    Patchy loss of hair 03/08/2015   Primary osteoarthritis of both knees 02/25/2015   Incarcerated incisional hernia s/p lap repair w mesh 12/10/2014 12/10/2014   DVT (deep venous thrombosis) (HCC) 10/31/2013   Pulmonary embolism (HCC) 10/26/2013   Kidney stone 10/04/2013   Tobacco abuse 10/04/2013   COPD (chronic obstructive pulmonary disease) (HCC)    Unspecified constipation 12/23/2012   Chronic kidney disease, stage 3b (HCC) 12/21/2012   Allergic rhinitis 06/29/2012   Chronic low back pain 06/29/2012   Dysuria 06/29/2012   Edema 06/29/2012   Gastro-esophageal reflux disease without esophagitis 06/29/2012   Hemorrhoids 06/29/2012   Hirsutism 06/29/2012   Insomnia 06/29/2012   Localized superficial swelling, mass, or lump 06/29/2012   Urinary incontinence 06/29/2012   Skin sensation disturbance 06/29/2012   Shortness of breath 06/29/2012   Pulmonary embolism and infarction (HCC) 06/29/2012   Proteinuria 06/29/2012   Nondependent cannabis abuse 06/29/2012   Acquired cyst of kidney 10/24/2011   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   Cocaine abuse (HCC) 11/27/2010   Depressive disorder 09/23/2009   Ascites 09/23/2009   Obesity 09/01/2008   Chronic hepatitis B with cirrhosis (HCC) 08/31/2008   Acute hepatitis C virus infection 08/31/2008   Iron deficiency anemia due to chronic blood loss 08/31/2008   Generalized anxiety disorder 08/31/2008   Essential hypertension 08/31/2008   Coronary atherosclerosis 08/31/2008   Hepatic cirrhosis (HCC)  08/31/2008   History of hepatitis C 08/31/2008   PCP:  Tracey Harries, MD Pharmacy:   [facility] #[id number] [place], [place] - [address] AT [facility] & [address] [place] [address] Phone: [phone] Fax: [phone]  [facility] #[id number] - [place], [place] - [address] AT [address] [place] [address] Phone: [phone] Fax: [phone]  [facility] - [place], [place] - [address] [place] [address] Phone: [phone] Fax: [phone]     Social Determinants of Health (SDOH) Social History: SDOH Screenings   Food Insecurity: No Food Insecurity (06/25/2022)  Housing: Low Risk  (06/25/2022)  Transportation Needs: No Transportation Needs (06/25/2022)  Utilities: Not At Risk (06/25/2022)  Tobacco Use: Medium Risk (06/26/2022)   SDOH Interventions:     Readmission Risk Interventions    01/08/2020    8:55 AM  Readmission Risk Prevention Plan  Transportation Screening Complete  PCP or Specialist Appt within 3-5 Days Complete  HRI or Home Care Consult Complete  Social Work Consult for Recovery Care Planning/Counseling Complete  Palliative Care Screening Not Applicable  Medication Review Oceanographer) Complete

## 2022-06-26 NOTE — Progress Notes (Signed)
Cynthia Hughes Better 7:48 AM  Subjective: Patient seen and examined and hospital computer chart reviewed and case discussed with my partner Dr. Dulce Sellar and currently she has no new complaints no signs of obvious bleeding except with some dark stools on iron  Objective: Vital signs stable afebrile no acute distress exam please see preassessment evaluation decreased BUN and creatinine iron deficient hemoglobin stable INR okay  Assessment: Anemia guaiac positivity patient multiple medical problems  Plan: Okay to proceed with endoscopy with anesthesia assistance  [facility]  office [phone] After 5PM or if no answer call [phone]

## 2022-06-26 NOTE — Anesthesia Procedure Notes (Signed)
Procedure Name: MAC Date/Time: 06/26/2022 7:53 AM  Performed by: Ludwig Lean, CRNAPre-anesthesia Checklist: Patient identified, Emergency Drugs available, Suction available and Patient being monitored Patient Re-evaluated:Patient Re-evaluated prior to induction Oxygen Delivery Method: Simple face mask Preoxygenation: Pre-oxygenation with 100% oxygen Placement Confirmation: positive ETCO2 and breath sounds checked- equal and bilateral

## 2022-06-26 NOTE — Transfer of Care (Signed)
Immediate Anesthesia Transfer of Care Note  Patient: [name]  Procedure(s) Performed: Procedure(s): ESOPHAGOGASTRODUODENOSCOPY (EGD) WITH PROPOFOL (N/A)  Patient Location: PACU  Anesthesia Type:MAC  Level of Consciousness: Patient easily awoken, sedated, comfortable, cooperative, following commands, responds to stimulation.   Airway & Oxygen Therapy: Patient spontaneously breathing, ventilating well, oxygen via simple oxygen mask.  Post-op Assessment: Report given to PACU RN, vital signs reviewed and stable, moving all extremities.   Post vital signs: Reviewed and stable.  Complications: No apparent anesthesia complications  Last Vitals:  Vitals Value Taken Time  BP 113/55 06/26/22 0813  Temp    Pulse    Resp 17 06/26/22 0816  SpO2 98% 06/26/22 0817  Vitals shown include unvalidated device data.  Last Pain:  Vitals:   06/26/22 0711  TempSrc: Temporal  PainSc: 4       Patients Stated Pain Goal: 2 (06/25/22 2148)  Complications: No notable events documented.

## 2022-06-27 DIAGNOSIS — B181 Chronic viral hepatitis B without delta-agent: Secondary | ICD-10-CM | POA: Diagnosis not present

## 2022-06-27 DIAGNOSIS — I10 Essential (primary) hypertension: Secondary | ICD-10-CM | POA: Diagnosis not present

## 2022-06-27 DIAGNOSIS — N1832 Chronic kidney disease, stage 3b: Secondary | ICD-10-CM | POA: Diagnosis not present

## 2022-06-27 DIAGNOSIS — D500 Iron deficiency anemia secondary to blood loss (chronic): Secondary | ICD-10-CM | POA: Diagnosis not present

## 2022-06-27 LAB — COMPREHENSIVE METABOLIC PANEL
ALT: 19 U/L (ref 0–44)
AST: 19 U/L (ref 15–41)
Albumin: 3.3 g/dL — ABNORMAL LOW (ref 3.5–5.0)
Alkaline Phosphatase: 86 U/L (ref 38–126)
Anion gap: 10 (ref 5–15)
BUN: 13 mg/dL (ref 8–23)
CO2: 23 mmol/L (ref 22–32)
Calcium: 8.7 mg/dL — ABNORMAL LOW (ref 8.9–10.3)
Chloride: 103 mmol/L (ref 98–111)
Creatinine, Ser: 1.19 mg/dL — ABNORMAL HIGH (ref 0.44–1.00)
GFR, Estimated: 51 mL/min — ABNORMAL LOW (ref >60)
Glucose, Bld: 142 mg/dL — ABNORMAL HIGH (ref 70–99)
Potassium: 3.7 mmol/L (ref 3.5–5.1)
Sodium: 136 mmol/L (ref 135–145)
Total Bilirubin: 0.4 mg/dL (ref 0.3–1.2)
Total Protein: 6.6 g/dL (ref 6.5–8.1)

## 2022-06-27 LAB — CBC WITH DIFFERENTIAL/PLATELET
Abs Immature Granulocytes: 0.02 10*3/uL (ref 0.00–0.07)
Basophils Absolute: 0 10*3/uL (ref 0.0–0.1)
Basophils Relative: 0 %
Eosinophils Absolute: 0.1 10*3/uL (ref 0.0–0.5)
Eosinophils Relative: 1 %
HCT: 32.1 % — ABNORMAL LOW (ref 36.0–46.0)
Hemoglobin: 9.1 g/dL — ABNORMAL LOW (ref 12.0–15.0)
Immature Granulocytes: 0 %
Lymphocytes Relative: 23 %
Lymphs Abs: 1.1 10*3/uL (ref 0.7–4.0)
MCH: 23.6 pg — ABNORMAL LOW (ref 26.0–34.0)
MCHC: 28.3 g/dL — ABNORMAL LOW (ref 30.0–36.0)
MCV: 83.2 fL (ref 80.0–100.0)
Monocytes Absolute: 0.2 10*3/uL (ref 0.1–1.0)
Monocytes Relative: 4 %
Neutro Abs: 3.4 10*3/uL (ref 1.7–7.7)
Neutrophils Relative %: 72 %
Platelets: 147 10*3/uL — ABNORMAL LOW (ref 150–400)
RBC: 3.86 MIL/uL — ABNORMAL LOW (ref 3.87–5.11)
RDW: 17.8 % — ABNORMAL HIGH (ref 11.5–15.5)
WBC: 4.9 10*3/uL (ref 4.0–10.5)
nRBC: 0 % (ref 0.0–0.2)

## 2022-06-27 LAB — MISC LABCORP TEST (SEND OUT)
LabCorp test name: 83935
Labcorp test code: 83935

## 2022-06-27 LAB — MAGNESIUM: Magnesium: 1.7 mg/dL (ref 1.7–2.4)

## 2022-06-27 IMAGING — CR DG CHEST 1V PORT
1 series · 1 of 1 positions shown · non-contrast
Comparison: 10/27/2018

CLINICAL DATA: Bilateral lower extremity edema.

EXAM:
PORTABLE CHEST 1 VIEW

[AP]
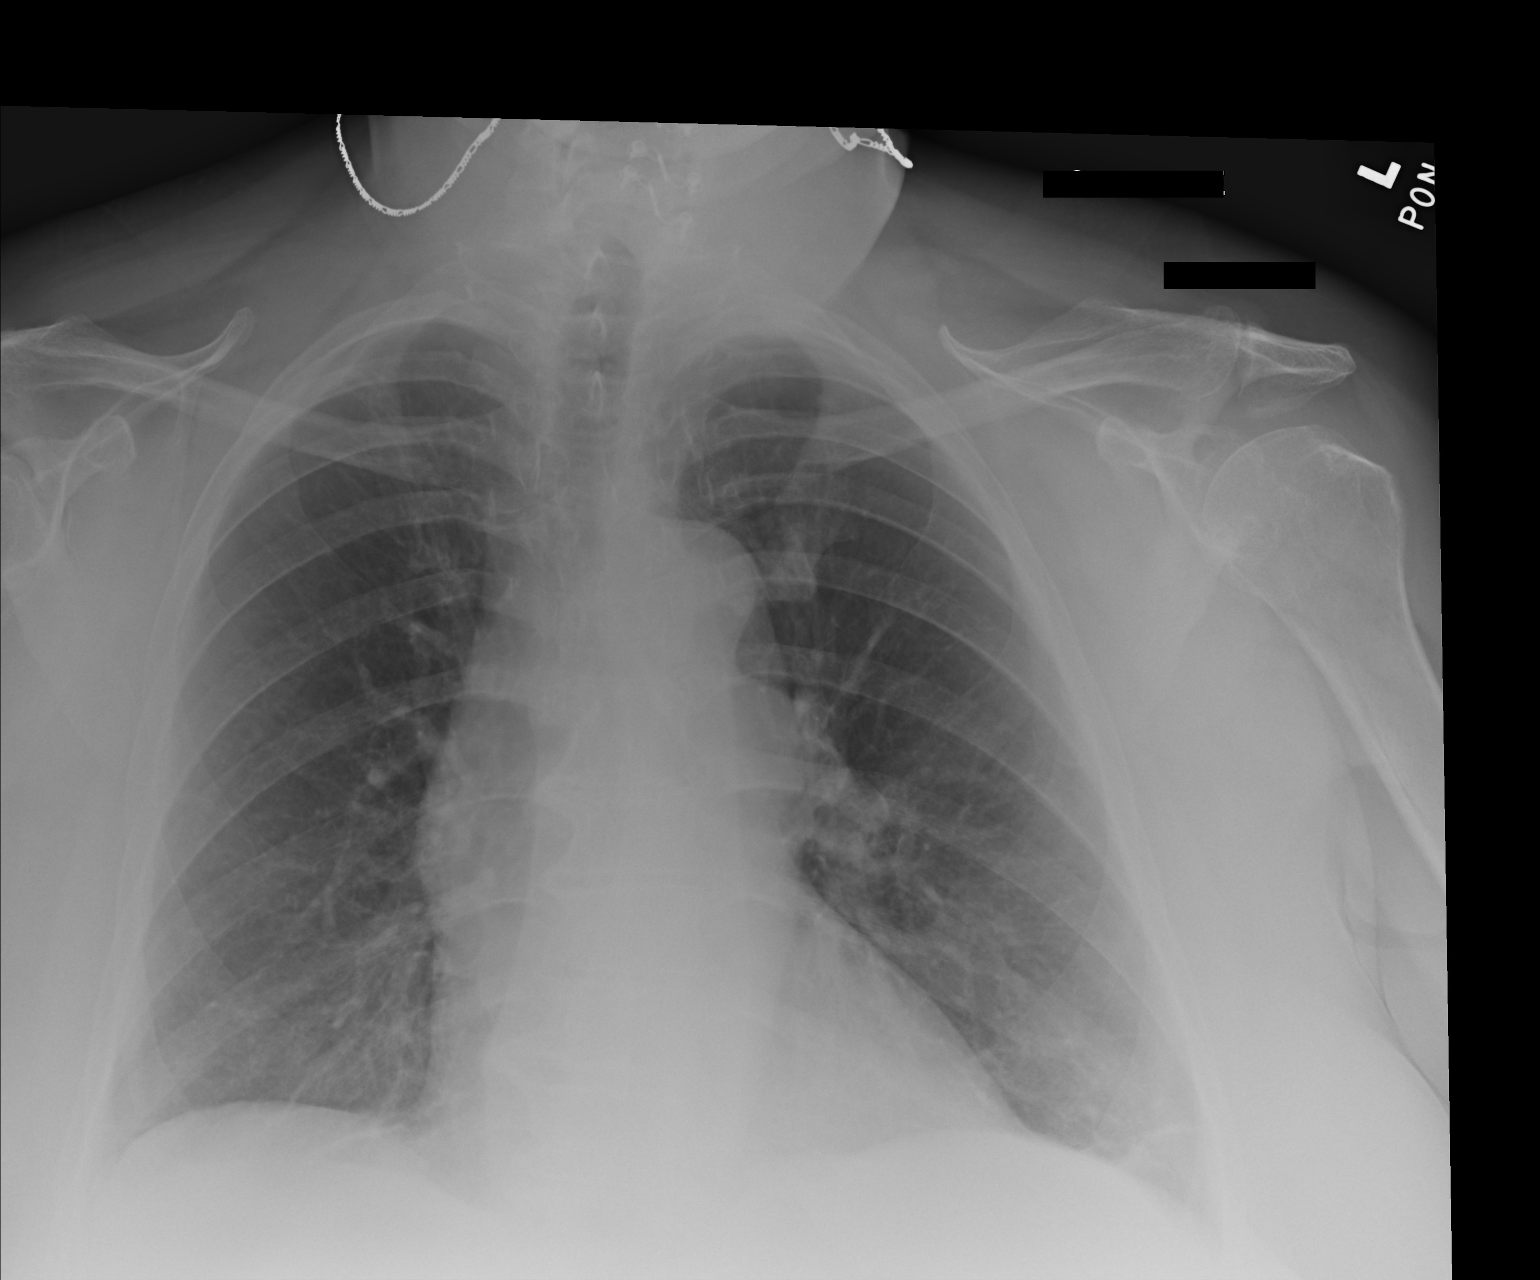

[1 of 1 positions shown; findings below may reference images not displayed]

FINDINGS: The heart size and mediastinal contours are within normal limits.
Both lungs are clear. The visualized skeletal structures are
unremarkable.
IMPRESSION: No active disease.

## 2022-06-27 MED ORDER — CARVEDILOL 6.25 MG PO TABS
6.2500 mg | ORAL_TABLET | Freq: Once | ORAL | Status: AC
  Administered 2022-06-27: 6.25 mg via ORAL
  Filled 2022-06-27: qty 1

## 2022-06-27 MED ORDER — SPIRONOLACTONE 25 MG PO TABS: 25.0000 mg | ORAL_TABLET | Freq: Every day | ORAL | 2 refills | Status: DC

## 2022-06-27 MED ORDER — ALPRAZOLAM 1 MG PO TABS: 1.0000 mg | ORAL_TABLET | Freq: Three times a day (TID) | ORAL | Status: AC

## 2022-06-27 MED ORDER — CARVEDILOL 12.5 MG PO TABS: 12.5000 mg | ORAL_TABLET | Freq: Two times a day (BID) | ORAL | Status: DC

## 2022-06-27 MED ORDER — CARVEDILOL 12.5 MG PO TABS: 12.5000 mg | ORAL_TABLET | Freq: Two times a day (BID) | ORAL | 2 refills | Status: AC

## 2022-06-27 NOTE — Discharge Summary (Signed)
Physician Discharge Summary   Patient: [name] MRN: [id number] DOB: 03-19-1955  Admit date:     06/24/2022  Discharge date: 06/27/22  Discharge Physician: Marinda Elk   PCP: Tracey Harries, MD   Recommendations at discharge:   Please take all prescribed medications exactly as instructed including increasing your regimen of carvedilol to 12.5 mg twice daily and reducing your spironolactone to 25 mg once daily. You may resume your regularly scheduled Xarelto regimen for prevention of blood clots however please be vigilant and monitor for any evidence of bleeding. Please consume a low-sodium low-fat diet.  Please avoid any alcohol use or taking any NSAIDs in the outpatient setting. Please increase your physical activity as tolerated. Please maintain all outpatient follow-up appointments including follow-up with your primary care provider as well as gastroenterology in the outpatient setting for continued workup. Please return to the emergency department if you develop black stool, bloody stool, chest pain, fevers in excess of 100.4 F, weakness or inability to tolerate oral intake.  Discharge Diagnoses: Principal Problem:   Iron deficiency anemia due to chronic blood loss Active Problems:   Essential hypertension   Chronic hepatitis B with cirrhosis (HCC)   Chronic kidney disease, stage 3b (HCC)   COPD (chronic obstructive pulmonary disease) (HCC)   Generalized anxiety disorder   Chronic diastolic CHF (congestive heart failure) (HCC)   Pulmonary embolism (HCC)   Chronic low back pain   Chronic pain syndrome   Compression fracture of body of thoracic vertebra (HCC)   History of DVT (deep vein thrombosis)   History of pulmonary embolism   ABLA (acute blood loss anemia)  Resolved Problems:   * No resolved hospital problems. *   Hospital Course: 67 year old female with past medical history of GERD, anxiety disorder, depression, agoraphobia, bipolar disorder, chronic kidney  disease stage IIIb, cocaine abuse (remote), COPD hypertension, vitamin D deficiency hepatitis B cirrhosis (on Entecavir, follows with [facility]), prior pulmonary embolism (on Xarelto), iron deficiency anemia, diastolic congestive heart failure (Echo 2017 EF 55-60% with G1DD) presenting to [facility] emergency department by her primary care provider due to having a low hemoglobin of 7.6.  Upon evaluation in the emergency department patient.  Considering patient's history of cirrhosis patient was initiated on intravenous Protonix, octreotide and ceftriaxone.  1 unit of packed red blood cells was transfused.  The hospitalist group was then called to assess the patient for admission to the hospital.  [facility] gastroenterology was contacted for consultation and patient was admitted to the stepdown unit under the hospitalist service.  Patient Xarelto was held and after monitoring patient's hemoglobin and hematocrit overnight the patient eventually went for upper endoscopy on 6/3 with Dr. Ewing Schlein.  The endoscopy revealed gastritis but without any evidence of active bleeding or ulcerations that would serve as a source..  In the following 24 hours the patient's octreotide infusion was weaned and turned off.    Patient's history was reviewed.  Considering patient has had pulmonary emboli on 3 separate occasions it was felt that the patient must remain on Xarelto and it cannot be discontinued.  This was therefore initiated and the patient was monitored until 06/27/2022 with a stable hemoglobin and hematocrit and no evidence of recurrent bleeding.  Of note, also during this hospitalization patient was placed back on a reduced dose of her usual regimen of spironolactone at 25 mg daily.  Apparently this was discontinued in the outpatient setting recently.  Furthermore the patient was noted to be  quite hypertensive throughout the hospitalization.  Her usual regimen of carvedilol was increased to 12.5 mg  twice daily.  The patient was discharged home in improved and stable condition on 06/27/2022.  Patient was advised to follow-up closely with her primary care provider as well as following up as an outpatient with gastroenterology.  She reported that she wished to follow-up with a gastroenterologist that saw her during this hospitalization and therefore she plans to follow-up with [facility] gastroenterology in the next several weeks.     Pain control - [employer] Controlled Substance Reporting System database was reviewed. and patient was instructed, not to drive, operate heavy machinery, perform activities at heights, swimming or participation in water activities or provide baby-sitting services while on Pain, Sleep and Anxiety Medications; until their outpatient Physician has advised to do so again. Also recommended to not to take more than prescribed Pain, Sleep and Anxiety Medications.   Consultants: Dr. Ewing Schlein with [facility] Gastro[facility] Procedures performed: None  Disposition: Home Diet recommendation:  Discharge Diet Orders (From admission, onward)     Start     Ordered   06/27/22 0000  Diet - low sodium heart healthy        06/27/22 1507           Cardiac diet  DISCHARGE MEDICATION: Allergies as of 06/27/2022       Reactions   Amitriptyline Hcl Other (See Comments)   Causes her to be very "disoriented".   Azithromycin Rash   Fentanyl Other (See Comments)   Sees things Pt stated had hallucinations "d/t taking high dosage, but can tolerate low dosage"   Cymbalta [duloxetine Hcl] Other (See Comments)   Headaches   Neomycin-bacitracin Zn-polymyx Rash        Medication List     STOP taking these medications    cefdinir 300 MG capsule Commonly known as: OMNICEF   doxycycline 100 MG capsule Commonly known as: VIBRAMYCIN   LORazepam 1 MG tablet Commonly known as: ATIVAN   metoprolol tartrate 25 MG tablet Commonly known as: LOPRESSOR       TAKE these  medications    albuterol 108 (90 Base) MCG/ACT inhaler Commonly known as: ProAir HFA INHALE 2 PUFFS INTO THE LUNGS EVERY 4 HOURS AS NEEDED FOR WHEEZING What changed:  how much to take how to take this when to take this reasons to take this additional instructions   albuterol (2.5 MG/3ML) 0.083% nebulizer solution Commonly known as: PROVENTIL Take 3 mLs (2.5 mg total) by nebulization every 4 (four) hours as needed for wheezing or shortness of breath. What changed: Another medication with the same name was changed. Make sure you understand how and when to take each.   ALPRAZolam 1 MG tablet Commonly known as: XANAX Take 1 tablet (1 mg total) by mouth 3 (three) times daily.   baclofen 20 MG tablet Commonly known as: LIORESAL Take 20 mg by mouth at bedtime.   budesonide-formoterol 160-4.5 MCG/ACT inhaler Commonly known as: SYMBICORT Inhale 2 puffs into the lungs 2 (two) times daily.   buPROPion 150 MG 24 hr tablet Commonly known as: WELLBUTRIN XL Take 150 mg by mouth every morning.   carvedilol 12.5 MG tablet Commonly known as: COREG Take 1 tablet (12.5 mg total) by mouth 2 (two) times daily with a meal. What changed:  medication strength how much to take when to take this   clotrimazole-betamethasone cream Commonly known as: LOTRISONE Apply to affected area 2 times daily prn What changed:  how much  to take how to take this when to take this reasons to take this   docusate sodium 100 MG capsule Commonly known as: COLACE Take 100 mg by mouth 2 (two) times daily.   doxepin 100 MG capsule Commonly known as: SINEQUAN Take 100 mg by mouth at bedtime.   entecavir 0.5 MG tablet Commonly known as: BARACLUDE Take 0.5 mg by mouth every other day.   famotidine 20 MG tablet Commonly known as: PEPCID Take 20 mg by mouth 2 (two) times daily.   gabapentin 300 MG capsule Commonly known as: NEURONTIN Take 300 mg by mouth 3 (three) times daily.   levocetirizine 5 MG  tablet Commonly known as: XYZAL Take 5 mg by mouth every evening.   montelukast 10 MG tablet Commonly known as: SINGULAIR Take 10 mg by mouth at bedtime.   ondansetron 4 MG disintegrating tablet Commonly known as: Zofran ODT Take 1 tablet (4 mg total) by mouth every 8 (eight) hours as needed for nausea or vomiting.   pantoprazole 40 MG tablet Commonly known as: PROTONIX Take 40 mg by mouth 2 (two) times daily before a meal.   rivaroxaban 20 MG Tabs tablet Commonly known as: XARELTO Take 20 mg by mouth daily.   spironolactone 25 MG tablet Commonly known as: ALDACTONE Take 1 tablet (25 mg total) by mouth daily. What changed:  medication strength how much to take   sucralfate 1 g tablet Commonly known as: CARAFATE Take 1 g by mouth 4 (four) times daily.   valACYclovir 500 MG tablet Commonly known as: VALTREX Take 500 mg by mouth daily as needed (breakout).   Vitamin D3 50 MCG (2000 UT) Tabs Take 2,000 Units by mouth daily.        Follow-up Information     Tracey Harries, MD. Schedule an appointment as soon as possible for a visit in 1 week(s).   Specialty: Family Medicine Contact information: [address] [place] [address] [phone]         Gastroenterology, Deboraha Sprang. Schedule an appointment as soon as possible for a visit in 2 week(s).   Why: Dr. Ewing Schlein is who you in your hospital stay.  You may see him, one of his nurse practitioner or PA's or one of his partners. Contact information: [address] [place] [address] [phone]                 Discharge Exam: [name]   06/24/22 1358 06/25/22 2050  Weight: 108 kg 118.6 kg    Constitutional: Awake alert and oriented x3, no associated distress.   Respiratory: clear to auscultation bilaterally, no wheezing, no crackles. Normal respiratory effort. No accessory muscle use.  Cardiovascular: Regular rate and rhythm, no murmurs / rubs / gallops. No  extremity edema. 2+ pedal pulses. No carotid bruits.  Abdomen: Abdomen is soft and nontender.  No evidence of intra-abdominal masses.  Positive bowel sounds noted in all quadrants.   Musculoskeletal: No joint deformity upper and lower extremities. Good ROM, no contractures. Normal muscle tone.     Condition at discharge: fair  The results of significant diagnostics from this hospitalization (including imaging, microbiology, ancillary and laboratory) are listed below for reference.   Imaging Studies: ECHOCARDIOGRAM COMPLETE  Result Date: 06/26/2022    ECHOCARDIOGRAM REPORT   Patient Name:   [name] Date of Exam: 06/26/2022 Medical Rec #:  [id number]    Height:       64.0 in Accession #:  [id number]   Weight:       261.4 lb Date of Birth:  May 21, 1955   BSA:          2.192 m Patient Age:    66 years     BP:           163/84 mmHg Patient Gender: F            HR:           86 bpm. Exam Location:  Inpatient Procedure: 2D Echo, Cardiac Doppler and Color Doppler Indications:    CHF - Acute Diastolic  History:        Patient has prior history of Echocardiogram examinations, most                 recent 07/08/2020. CHF, COPD and Hepatitis B, Cirrhosis, CKD,                 Spinal stenosis, Signs/Symptoms:Dyspnea, Fatigue and Edema; Risk                 Factors:Hypertension, Former Smoker and Hx of PE, cellulitis, hx                 of DVT.  Sonographer:    Wallie Char Referring Phys: [id number] Marinda Elk  Sonographer Comments: Patient is obese and Technically difficult study due to poor echo windows. Image acquisition challenging due to patient body habitus and Image acquisition challenging due to COPD. IMPRESSIONS  1. Left ventricular ejection fraction, by estimation, is 60 to 65%. The left ventricle has normal function. The left ventricle has no regional wall motion abnormalities. There is moderate concentric left ventricular hypertrophy of the septal segment. Left ventricular diastolic parameters are  consistent with Grade I diastolic dysfunction (impaired relaxation).  2. Right ventricular systolic function is normal. The right ventricular size is normal. There is mildly elevated pulmonary artery systolic pressure. The estimated right ventricular systolic pressure is 41.4 mmHg.  3. The mitral valve is normal in structure. No evidence of mitral valve regurgitation. No evidence of mitral stenosis.  4. The aortic valve is normal in structure. Aortic valve regurgitation is not visualized. No aortic stenosis is present.  5. The inferior vena cava is dilated in size with <50% respiratory variability, suggesting right atrial pressure of 15 mmHg. FINDINGS  Left Ventricle: Left ventricular ejection fraction, by estimation, is 60 to 65%. The left ventricle has normal function. The left ventricle has no regional wall motion abnormalities. The left ventricular internal cavity size was normal in size. There is  moderate concentric left ventricular hypertrophy of the septal segment. Left ventricular diastolic parameters are consistent with Grade I diastolic dysfunction (impaired relaxation). Right Ventricle: The right ventricular size is normal. No increase in right ventricular wall thickness. Right ventricular systolic function is normal. There is mildly elevated pulmonary artery systolic pressure. The tricuspid regurgitant velocity is 2.57  m/s, and with an assumed right atrial pressure of 15 mmHg, the estimated right ventricular systolic pressure is 41.4 mmHg. Left Atrium: Left atrial size was normal in size. Right Atrium: Right atrial size was normal in size. Pericardium: There is no evidence of pericardial effusion. Mitral Valve: The mitral valve is normal in structure. No evidence of mitral valve regurgitation. No evidence of mitral valve stenosis. MV peak gradient, 7.4 mmHg. The mean mitral valve gradient is 3.0 mmHg. Tricuspid Valve: The tricuspid valve is normal in structure. Tricuspid valve regurgitation is not  demonstrated. No evidence of tricuspid stenosis. Aortic  Valve: The aortic valve is normal in structure. Aortic valve regurgitation is not visualized. No aortic stenosis is present. Aortic valve mean gradient measures 8.0 mmHg. Aortic valve peak gradient measures 15.8 mmHg. Aortic valve area, by VTI measures 2.25 cm. Pulmonic Valve: The pulmonic valve was normal in structure. Pulmonic valve regurgitation is not visualized. No evidence of pulmonic stenosis. Aorta: The aortic root is normal in size and structure. Venous: The inferior vena cava is dilated in size with less than 50% respiratory variability, suggesting right atrial pressure of 15 mmHg. IAS/Shunts: No atrial level shunt detected by color flow Doppler.  LEFT VENTRICLE PLAX 2D LVIDd:         4.10 cm     Diastology LVIDs:         2.40 cm     LV e' medial:    6.05 cm/s LV PW:         1.10 cm     LV E/e' medial:  11.2 LV IVS:        1.40 cm     LV e' lateral:   5.03 cm/s LVOT diam:     1.90 cm     LV E/e' lateral: 13.4 LV SV:         75 LV SV Index:   34 LVOT Area:     2.84 cm  LV Volumes (MOD) LV vol d, MOD A2C: 78.9 ml LV vol d, MOD A4C: 98.9 ml LV vol s, MOD A2C: 23.0 ml LV vol s, MOD A4C: 30.1 ml LV SV MOD A2C:     55.9 ml LV SV MOD A4C:     98.9 ml LV SV MOD BP:      61.2 ml RIGHT VENTRICLE             IVC RV S prime:     16.30 cm/s  IVC diam: 2.80 cm TAPSE (M-mode): 2.5 cm LEFT ATRIUM             Index        RIGHT ATRIUM           Index LA diam:        3.20 cm 1.46 cm/m   RA Area:     14.80 cm LA Vol (A2C):   47.3 ml 21.58 ml/m  RA Volume:   35.50 ml  16.19 ml/m LA Vol (A4C):   54.5 ml 24.86 ml/m LA Biplane Vol: 51.4 ml 23.45 ml/m  AORTIC VALVE AV Area (Vmax):    2.26 cm AV Area (Vmean):   2.18 cm AV Area (VTI):     2.25 cm AV Vmax:           198.50 cm/s AV Vmean:          130.500 cm/s AV VTI:            0.334 m AV Peak Grad:      15.8 mmHg AV Mean Grad:      8.0 mmHg LVOT Vmax:         158.00 cm/s LVOT Vmean:        100.500 cm/s LVOT VTI:           0.266 m LVOT/AV VTI ratio: 0.79  AORTA Ao Root diam: 3.20 cm Ao Asc diam:  3.10 cm MITRAL VALVE               TRICUSPID VALVE MV Area (PHT): 2.54 cm    TR Peak grad:   26.4 mmHg MV Area VTI:  2.57 cm    TR Vmax:        257.00 cm/s MV Peak grad:  7.4 mmHg MV Mean grad:  3.0 mmHg    SHUNTS MV Vmax:       1.36 m/s    Systemic VTI:  0.27 m MV Vmean:      83.8 cm/s   Systemic Diam: 1.90 cm MV Decel Time: 299 msec MV E velocity: 67.60 cm/s MV A velocity: 99.50 cm/s MV E/A ratio:  0.68 Aditya Sabharwal Electronically signed by Dorthula Nettles Signature Date/Time: 06/26/2022/12:18:04 PM    Final    CT ABDOMEN PELVIS WO CONTRAST  Result Date: 06/26/2022 CLINICAL DATA:  Lower abdominal pain, possible lower gastrointestinal bleeding. EXAM: CT ABDOMEN AND PELVIS WITHOUT CONTRAST TECHNIQUE: Multidetector CT imaging of the abdomen and pelvis was performed following the standard protocol without IV contrast. RADIATION DOSE REDUCTION: This exam was performed according to the departmental dose-optimization program which includes automated exposure control, adjustment of the mA and/or kV according to patient size and/or use of iterative reconstruction technique. COMPARISON:  July 18, 2020. FINDINGS: Lower chest: Minimal bibasilar subsegmental atelectasis. Hepatobiliary: No cholelithiasis or biliary dilatation is noted. Nodular hepatic contours are noted suggesting hepatic cirrhosis. Pancreas: Unremarkable. No pancreatic ductal dilatation or surrounding inflammatory changes. Spleen: Normal in size without focal abnormality. Adrenals/Urinary Tract: Adrenal glands appear normal. Bilateral renal cortical scarring is noted. Stable right renal cyst is noted for which no further follow-up required. Bilateral nonobstructive nephrolithiasis. No hydronephrosis or renal obstruction is noted. Urinary bladder is unremarkable. Stomach/Bowel: Stomach is within normal limits. Appendix appears normal. No evidence of bowel wall thickening,  distention, or inflammatory changes. Vascular/Lymphatic: Aortic atherosclerosis. No enlarged abdominal or pelvic lymph nodes. Reproductive: Status post hysterectomy. No adnexal masses. Other: No abdominal wall hernia or abnormality. No abdominopelvic ascites. Musculoskeletal: No acute or significant osseous findings. IMPRESSION: Hepatic cirrhosis. Bilateral nonobstructive nephrolithiasis. Aortic Atherosclerosis (ICD10-I70.0). Electronically Signed   By: Lupita Raider M.D.   On: 06/26/2022 11:48   DG Chest 1 View  Result Date: 06/25/2022 CLINICAL DATA:  Pulmonary edema. EXAM: CHEST  1 VIEW COMPARISON:  April 25, 2021 FINDINGS: The cardiomediastinal silhouette is stable. The thoracic aorta remains tortuous and prominent. No pneumothorax. Minimal atelectasis in the left base. No pulmonary edema, nodule, mass, or suspicious infiltrate. IMPRESSION: 1. Minimal atelectasis in the left base. No other acute abnormalities. 2. The thoracic aorta remains tortuous and prominent. Electronically Signed   By: Gerome Sam III M.D.   On: 06/25/2022 14:58   VAS [place] LOWER EXTREMITY VENOUS (DVT)  Result Date: 06/25/2022  Lower Venous DVT Study Patient Name:  SHONIE GIMENEZ  Date of Exam:   06/25/2022 Medical Rec #: [id number]     Accession #:    [id number] Date of Birth: 05/25/1955    Patient Gender: F Patient Age:   61 years Exam Location:  [facility] Procedure:      VAS [place] LOWER EXTREMITY VENOUS (DVT) Referring Phys: DAVID ORTIZ --------------------------------------------------------------------------------  Indications: Swelling.  Risk Factors: None identified. Limitations: Poor ultrasound/tissue interface, body habitus and patient movement. Comparison Study: No prior studies. Performing Technologist: Chanda Busing RVT  Examination Guidelines: A complete evaluation includes B-mode imaging, spectral Doppler, color Doppler, and power Doppler as needed of all accessible portions of each vessel. Bilateral testing is  considered an integral part of a complete examination. Limited examinations for reoccurring indications may be performed as noted. The reflux portion of the exam is performed with the patient in  reverse Trendelenburg.  +---------+---------------+---------+-----------+----------+--------------+ RIGHT    CompressibilityPhasicitySpontaneityPropertiesThrombus Aging +---------+---------------+---------+-----------+----------+--------------+ CFV      Full           Yes      Yes                                 +---------+---------------+---------+-----------+----------+--------------+ SFJ      Full                                                        +---------+---------------+---------+-----------+----------+--------------+ FV Prox  Full                                                        +---------+---------------+---------+-----------+----------+--------------+ FV Mid   Full                                                        +---------+---------------+---------+-----------+----------+--------------+ FV DistalFull                                                        +---------+---------------+---------+-----------+----------+--------------+ PFV      Full                                                        +---------+---------------+---------+-----------+----------+--------------+ POP      Full           Yes      Yes                                 +---------+---------------+---------+-----------+----------+--------------+ PTV      Full                                                        +---------+---------------+---------+-----------+----------+--------------+ PERO     Full                                                        +---------+---------------+---------+-----------+----------+--------------+   +---------+---------------+---------+-----------+----------+--------------+ LEFT      CompressibilityPhasicitySpontaneityPropertiesThrombus Aging +---------+---------------+---------+-----------+----------+--------------+ CFV      Full           Yes      Yes                                 +---------+---------------+---------+-----------+----------+--------------+  SFJ      Full                                                        +---------+---------------+---------+-----------+----------+--------------+ FV Prox  Full                                                        +---------+---------------+---------+-----------+----------+--------------+ FV Mid   Full                                                        +---------+---------------+---------+-----------+----------+--------------+ FV DistalFull                                                        +---------+---------------+---------+-----------+----------+--------------+ PFV      Full                                                        +---------+---------------+---------+-----------+----------+--------------+ POP      Full           Yes      Yes                                 +---------+---------------+---------+-----------+----------+--------------+ PTV      Full                                                        +---------+---------------+---------+-----------+----------+--------------+ PERO     Full                                                        +---------+---------------+---------+-----------+----------+--------------+     Summary: RIGHT: - There is no evidence of deep vein thrombosis in the lower extremity.  - No cystic structure found in the popliteal fossa.  LEFT: - There is no evidence of deep vein thrombosis in the lower extremity.  - No cystic structure found in the popliteal fossa.  *See table(s) above for measurements and observations. Electronically signed by Sherald Hess MD on 06/25/2022 at 11:33:56 AM.    Final      Microbiology: Results for orders placed or performed during the hospital encounter of 06/24/22  MRSA Next Gen by PCR, Nasal     Status: None   Collection Time: 06/24/22  7:17 PM   Specimen: Nasal Mucosa; Nasal Swab  Result Value Ref Range Status   MRSA by PCR Next Gen NOT DETECTED NOT DETECTED Final    Comment: (NOTE) The GeneXpert MRSA Assay (FDA approved for NASAL specimens only), is one component of a comprehensive MRSA colonization surveillance program. It is not intended to diagnose MRSA infection nor to guide or monitor treatment for MRSA infections. Test performance is not FDA approved in patients less than 43 years old. Performed at [facility], [address], [place], [place] [address]   Group A Strep by PCR     Status: None   Collection Time: 06/25/22  3:48 PM   Specimen: Throat; Sterile Swab  Result Value Ref Range Status   Group A Strep by PCR NOT DETECTED NOT DETECTED Final    Comment: Performed at [facility], [address] [place], [place] [address]    Labs: CBC: Recent Labs  Lab 06/25/22 0228 06/25/22 1541 06/26/22 0451 06/26/22 1558 06/27/22 0502  WBC 5.0 6.5 5.8 6.1 4.9  NEUTROABS  --   --  4.1  --  3.4  HGB 8.5* 9.1* 8.5* 8.3* 9.1*  HCT 29.1* 32.1* 29.8* 28.9* 32.1*  MCV 81.7 83.6 82.5 83.3 83.2  PLT 158 161 154 149* 147*   Basic Metabolic Panel: Recent Labs  Lab 06/24/22 1444 06/25/22 0228 06/26/22 0451 06/27/22 0502  NA 137 137 136 136  K 3.6 3.7 3.5 3.7  CL 106 105 104 103  CO2 22 24 25 23   GLUCOSE 124* 130* 119* 142*  BUN 19 18 16 13   CREATININE 1.57* 1.45* 1.35* 1.19*  CALCIUM 8.5* 8.3* 8.1* 8.7*  MG  --   --  1.8 1.7   Liver Function Tests: Recent Labs  Lab 06/24/22 1444 06/25/22 0228 06/26/22 0451 06/27/22 0502  AST 39 22 19 19   ALT 25 23 20 19   ALKPHOS 80 83 83 86  BILITOT 0.9 0.5 0.4 0.4  PROT 6.2* 6.2* 6.1* 6.6  ALBUMIN 3.2* 3.2* 3.1* 3.3*   CBG: No results for input(s):  "GLUCAP" in the last 168 hours.  Discharge time spent: greater than 30 minutes.  Signed: Marinda Elk, MD [facility] 06/27/2022

## 2022-06-27 NOTE — TOC Transition Note (Signed)
Transition of Care [facility]) - CM/SW Discharge Note   Patient Details  Name: [name] MRN: [id number] Date of Birth: 05-18-1955  Transition of Care [facility]) CM/SW Contact:  Lanier Clam, RN Phone Number: 06/27/2022, 3:15 PM   Clinical Narrative:  d/c home No needs or orders.     Final next level of care: Home/Self Care Barriers to Discharge: No Barriers Identified   Patient Goals and CMS Choice CMS Medicare.gov Compare Post Acute Care list provided to:: Patient    Discharge Placement                         Discharge Plan and Services Additional resources added to the After Visit Summary for     Discharge Planning Services: CM Consult                                 Social Determinants of Health (SDOH) Interventions SDOH Screenings   Food Insecurity: No Food Insecurity (06/25/2022)  Housing: Low Risk  (06/25/2022)  Transportation Needs: No Transportation Needs (06/25/2022)  Utilities: Not At Risk (06/25/2022)  Tobacco Use: Medium Risk (06/26/2022)     Readmission Risk Interventions    06/26/2022   12:01 PM 01/08/2020    8:55 AM  Readmission Risk Prevention Plan  Transportation Screening Complete Complete  PCP or Specialist Appt within 5-7 Days Complete   PCP or Specialist Appt within 3-5 Days  Complete  Home Care Screening Complete   Medication Review (RN CM) Complete   HRI or Home Care Consult  Complete  Social Work Consult for Recovery Care Planning/Counseling  Complete  Palliative Care Screening  Not Applicable  Medication Review Ocean[facility])  Complete

## 2022-06-27 NOTE — Progress Notes (Signed)
Mobility Specialist - Progress Note   06/27/22 1205  Mobility  Activity Ambulated with assistance in hallway  Level of Assistance Standby assist, set-up cues, supervision of patient - no hands on  Assistive Device Front wheel walker  Distance Ambulated (ft) 200 ft  Range of Motion/Exercises Active  Activity Response Tolerated well  Mobility Referral Yes  $Mobility charge 1 Mobility  Mobility Specialist Start Time (ACUTE ONLY) 1130  Mobility Specialist Stop Time (ACUTE ONLY) 1205  Mobility Specialist Time Calculation (min) (ACUTE ONLY) 35 min   Pt was found sitting EOB and agreeable to ambulate. Pt stated feeling weaker than her baseline and preferring to use RW than cane. Had no major complaints during session and at EOS returned to recliner chair with all necessities in reach. RN made aware.  Billey Chang Mobility Specialist

## 2022-06-27 NOTE — Discharge Instructions (Addendum)
Please take all prescribed medications exactly as instructed including increasing your regimen of carvedilol to 12.5 mg twice daily and reducing your spironolactone to 25 mg once daily. You may resume your regularly scheduled Xarelto regimen for prevention of blood clots however please be vigilant and monitor for any evidence of bleeding. Please consume a low-sodium low-fat diet.  Please avoid any alcohol use or taking any NSAIDs in the outpatient setting. Please increase your physical activity as tolerated. Please maintain all outpatient follow-up appointments including follow-up with your primary care provider as well as gastroenterology in the outpatient setting for continued workup. Please return to the emergency department if you develop black stool, bloody stool, chest pain, fevers in excess of 100.4 F, weakness or inability to tolerate oral intake.

## 2022-06-29 ENCOUNTER — Encounter (HOSPITAL_COMMUNITY): Payer: Self-pay | Admitting: Gastroenterology

## 2022-07-02 ENCOUNTER — Ambulatory Visit
Admission: EM | Admit: 2022-07-02 | Discharge: 2022-07-02 | Disposition: A | Discharge status: Home / Self Care | Payer: 59 | Attending: Family Medicine | Admitting: Family Medicine

## 2022-07-02 DIAGNOSIS — D509 Iron deficiency anemia, unspecified: Secondary | ICD-10-CM

## 2022-07-02 DIAGNOSIS — R600 Localized edema: Secondary | ICD-10-CM | POA: Diagnosis not present

## 2022-07-02 NOTE — ED Triage Notes (Signed)
Pt reports she has swelling in her right calf/leg x 1 year but has never been this severe.  Pt wants her blood checked

## 2022-07-02 NOTE — Discharge Instructions (Signed)
We have drawn a blood count to check for anemia and a chemistry panel to check your sodium and potassium and kidney and liver function numbers.  Staff will notify you if there is anything abnormal that needs treatment.  You can take 2 of your spironolactone 25 mg tablets once daily for the next 2 days.  Please follow-up with your primary care and your specialists about this issue

## 2022-07-02 NOTE — ED Provider Notes (Signed)
[name]    CSN: [id number] Arrival date & time: 07/02/22  1127      History   Chief Complaint No chief complaint on file.   HPI [name] is a 67 y.o. female.   HPI Here for repeat blood work.  She states she is concerned about swelling in her right lower leg.  It has been swelling more in the last year but has been worse in the last 6 months.  Also she was admitted for a GI bleed on June 1 through June 4.  Her hemoglobin had dropped to 7.6.  Of note, during that admission she had bilateral venous Dopplers of the lower extremities.  There was no DVT on those studies.  She did have a BNP during that admission that was normal.  Electrolytes were normal with a normal sodium of 136 and potassium 3.7.  Creatinine when last done was 1.19 with an EGFR of 51.  Last hemoglobin, after transfusion was 9.1 and her hematocrit was 32.1  She states she has had a little sore throat.  She reports her temperature was 1022 nights ago, but temperature is normal here at 97.6. She reports a headache.    She did restart her Xarelto as instructed  Past Medical History:  Diagnosis Date   Agoraphobia with panic attacks    Altered mental state 10/26/2013   Ankle fracture, left 11/27/2010   S/p ORIF 11/2    Anxiety    Arthritis    "knees; lower back" (10/30/2013)   Bipolar disorder (HCC)    Cavitary pneumonia    Cirrhosis (HCC)    CKD (chronic kidney disease), stage III (HCC)    COPD (chronic obstructive pulmonary disease) (HCC)    Depression    DVT (deep venous thrombosis) (HCC) 09/2013   RLE   GERD (gastroesophageal reflux disease)    Heart murmur    Hepatitis B    History of blood transfusion    "due to excessive blood loss before hysterectomy"   History of urinary tract infection    Hypertension    currently on no medication    Kidney stones    Pneumonia    "4 times in the past year" (10/30/2013)   Pulmonary emboli (HCC) 09/2013   Shortness of breath dyspnea     increased exertion;exercise   Urinary frequency    Urinary incontinence     Patient Active Problem List   Diagnosis Date Noted   Chronic diastolic CHF (congestive heart failure) (HCC) 06/25/2022   ABLA (acute blood loss anemia) 06/24/2022   Severe sepsis with acute organ dysfunction (HCC) 01/04/2020   Primary osteoarthritis of right knee 12/04/2019   Primary osteoarthritis of left knee 12/04/2019   Vitamin D deficiency 09/20/2019   Cellulitis of foot 09/18/2019   Aspiration pneumonia of both lower lobes due to gastric secretions (HCC) 06/02/2019   Lumbar disc herniation 05/04/2019   Encounter for colorectal cancer screening 04/19/2019   Acute respiratory distress 04/06/2019   Hyperglycemia 04/06/2019   Pleural effusion on right 04/06/2019   Benzodiazepine dependence (HCC) 02/16/2019   Cervical stenosis of spinal canal 02/15/2019   Streptococcal sepsis (HCC) 01/22/2019   History of sepsis 01/22/2019   Acute bronchitis due to Rhinovirus 11/06/2018   Lung collapse 11/01/2018   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 08/13/2018   Compression fracture of body of thoracic vertebra (HCC) 08/01/2018   Closed nondisplaced fracture of greater tuberosity of right humerus 07/08/2018   On rivaroxaban  therapy 07/08/2018   Chronic anticoagulation 05/27/2018   Falls frequently 05/27/2018   History of DVT (deep vein thrombosis) 03/16/2018   History of pulmonary embolism 03/10/2018   Left lower quadrant abdominal pain 03/10/2018   Chronic pain syndrome 03/05/2018   COPD exacerbation (HCC) 06/09/2016   Abnormal urine odor 09/26/2015   Recurrent pulmonary embolism (HCC) 06/26/2015   Lupus anticoagulant positive 06/26/2015   Thrombocytopenia (HCC) 05/31/2015   UTI (lower urinary tract infection) 05/26/2015   Right flank pain    Patchy loss of hair 03/08/2015   Primary osteoarthritis of both knees 02/25/2015   Incarcerated incisional hernia s/p lap repair w mesh  12/10/2014 12/10/2014   DVT (deep venous thrombosis) (HCC) 10/31/2013   Pulmonary embolism (HCC) 10/26/2013   Kidney stone 10/04/2013   Tobacco abuse 10/04/2013   COPD (chronic obstructive pulmonary disease) (HCC)    Unspecified constipation 12/23/2012   Chronic kidney disease, stage 3b (HCC) 12/21/2012   Allergic rhinitis 06/29/2012   Chronic low back pain 06/29/2012   Dysuria 06/29/2012   Edema 06/29/2012   Gastro-esophageal reflux disease without esophagitis 06/29/2012   Hemorrhoids 06/29/2012   Hirsutism 06/29/2012   Insomnia 06/29/2012   Localized superficial swelling, mass, or lump 06/29/2012   Urinary incontinence 06/29/2012   Skin sensation disturbance 06/29/2012   Shortness of breath 06/29/2012   Pulmonary embolism and infarction (HCC) 06/29/2012   Proteinuria 06/29/2012   Nondependent cannabis abuse 06/29/2012   Acquired cyst of kidney 10/24/2011   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   Cocaine abuse (HCC) 11/27/2010   Depressive disorder 09/23/2009   Ascites 09/23/2009   Obesity 09/01/2008   Chronic hepatitis B with cirrhosis (HCC) 08/31/2008   Acute hepatitis C virus infection 08/31/2008   Iron deficiency anemia due to chronic blood loss 08/31/2008   Generalized anxiety disorder 08/31/2008   Essential hypertension 08/31/2008   Coronary atherosclerosis 08/31/2008   Hepatic cirrhosis (HCC) 08/31/2008   History of hepatitis C 08/31/2008    Past Surgical History:  Procedure Laterality Date   ABDOMINAL HYSTERECTOMY     ANKLE FRACTURE SURGERY Left    BIOPSY  06/26/2022   Procedure: BIOPSY;  Surgeon: Vida Rigger, MD;  Location: [facility];  Service: Gastroenterology;;   CARPAL TUNNEL RELEASE Left    DILATION AND CURETTAGE OF UTERUS  X 2   ESOPHAGOGASTRODUODENOSCOPY (EGD) WITH PROPOFOL N/A 06/26/2022   Procedure: ESOPHAGOGASTRODUODENOSCOPY (EGD) WITH PROPOFOL;  Surgeon: Vida Rigger, MD;  Location: [facility];  Service: Gastroenterology;  Laterality: N/A;    FRACTURE SURGERY     HAND SURGERY     1980's/left hand    INSERTION OF MESH N/A 12/10/2014   Procedure: INSERTION OF MESH;  Surgeon: Karie Soda, MD;  Location: [facility];  Service: General;  Laterality: N/A;   LAPAROSCOPIC ASSISTED VENTRAL HERNIA REPAIR N/A 12/10/2014   Procedure: LAPAROSCOPIC ASSISTED REPAIR OF INCARCERATED UMBILICAL HERNIA ;  Surgeon: Karie Soda, MD;  Location: [facility];  Service: General;  Laterality: N/A;   TUBAL LIGATION      OB History   No obstetric history on file.      Home Medications    Prior to Admission medications   Medication Sig Start Date End Date Taking? Authorizing Provider  albuterol (PROAIR HFA) 108 (90 Base) MCG/ACT inhaler INHALE 2 PUFFS INTO THE LUNGS EVERY 4 HOURS AS NEEDED FOR WHEEZING Patient taking differently: Inhale 2 puffs into the lungs every 4 (four) hours as needed for shortness of breath or wheezing. 12/04/18   Oretha Milch, MD  albuterol (PROVENTIL) (2.5 MG/3ML) 0.083% nebulizer solution Take 3 mLs (2.5 mg total) by nebulization every 4 (four) hours as needed for wheezing or shortness of breath. 12/04/18   Oretha Milch, MD  ALPRAZolam Prudy Feeler) 1 MG tablet Take 1 tablet (1 mg total) by mouth 3 (three) times daily. 06/27/22   Shalhoub, Deno Lunger, MD  baclofen (LIORESAL) 20 MG tablet Take 20 mg by mouth at bedtime. 04/22/20   [provider]  budesonide-formoterol (SYMBICORT) 160-4.5 MCG/ACT inhaler Inhale 2 puffs into the lungs 2 (two) times daily. 12/04/18 06/24/22  Oretha Milch, MD  buPROPion (WELLBUTRIN XL) 150 MG 24 hr tablet Take 150 mg by mouth every morning.    [provider]  carvedilol (COREG) 12.5 MG tablet Take 1 tablet (12.5 mg total) by mouth 2 (two) times daily with a meal. 06/27/22   Shalhoub, Deno Lunger, MD  Cholecalciferol (VITAMIN D3) 50 MCG (2000 UT) TABS Take 2,000 Units by mouth daily.    [provider]  clotrimazole-betamethasone (LOTRISONE) cream Apply to affected area 2 times daily  prn Patient taking differently: Apply 1 application topically 2 (two) times daily as needed (rash). Apply to affected area 2 times daily prn 04/29/20   Cathren Laine, MD  docusate sodium (COLACE) 100 MG capsule Take 100 mg by mouth 2 (two) times daily.    [provider]  doxepin (SINEQUAN) 100 MG capsule Take 100 mg by mouth at bedtime. 06/01/22   [provider]  entecavir (BARACLUDE) 0.5 MG tablet Take 0.5 mg by mouth every other day. 11/25/19   [provider]  famotidine (PEPCID) 20 MG tablet Take 20 mg by mouth 2 (two) times daily. 10/28/19   [provider]  gabapentin (NEURONTIN) 300 MG capsule Take 300 mg by mouth 3 (three) times daily. 07/13/20   [provider]  levocetirizine (XYZAL) 5 MG tablet Take 5 mg by mouth every evening. 12/02/19   [provider]  montelukast (SINGULAIR) 10 MG tablet Take 10 mg by mouth at bedtime.    [provider]  ondansetron (ZOFRAN ODT) 4 MG disintegrating tablet Take 1 tablet (4 mg total) by mouth every 8 (eight) hours as needed for nausea or vomiting. 07/18/20   Sabas Sous, MD  pantoprazole (PROTONIX) 40 MG tablet Take 40 mg by mouth 2 (two) times daily before a meal. 12/23/13   [provider]  rivaroxaban (XARELTO) 20 MG TABS tablet Take 20 mg by mouth daily.    [provider]  spironolactone (ALDACTONE) 25 MG tablet Take 1 tablet (25 mg total) by mouth daily. 06/27/22   Shalhoub, Deno Lunger, MD  sucralfate (CARAFATE) 1 g tablet Take 1 g by mouth 4 (four) times daily. 07/22/19   [provider]  valACYclovir (VALTREX) 500 MG tablet Take 500 mg by mouth daily as needed (breakout). 08/21/17   [provider]  enoxaparin (LOVENOX) 120 MG/0.8ML SOLN Inject 0.8 mLs (120 mg total) into the skin every 12 (twelve) hours. 11/30/10 04/16/11  Daryel Gerald, MD  loratadine (CLARITIN) 10 MG tablet Take 10 mg by mouth daily.    04/16/11  [provider]     Family History Family History  Problem Relation Age of Onset   Stroke Mother    Lung cancer Father     Social History Social History   Tobacco Use   Smoking status: Former    Packs/day: 0.50    Years: 35.00    Additional pack years: 0.00  Total pack years: 17.50    Types: Cigarettes    Quit date: 10/26/2013    Years since quitting: 8.6   Smokeless tobacco: Never  Vaping Use   Vaping Use: Some days   Substances: Nicotine, Flavoring  Substance Use Topics   Alcohol use: No    Alcohol/week: 0.0 standard drinks of alcohol    Comment: "stopped drinking  in 2004"   Drug use: No    Types: "Crack" cocaine, Marijuana    Comment: 10/30/2013 "last crack in 2014; last marijuana in ~ 1990" drug free for last 3 years      Allergies   Amitriptyline hcl, Azithromycin, Fentanyl, Cymbalta [duloxetine hcl], and Neomycin-bacitracin zn-polymyx   Review of Systems Review of Systems   Physical Exam Triage Vital Signs ED Triage Vitals [07/02/22 1221]  Enc Vitals Group     BP (!) 161/90     Pulse Rate (!) 108     Resp 18     Temp 97.6 F (36.4 C)     Temp Source Oral     SpO2 95 %     Weight      Height      Head Circumference      Peak Flow      Pain Score 0     Pain Loc      Pain Edu?      Excl. in GC?    No data found.  Updated Vital Signs BP (!) 161/90 (BP Location: Left Arm)   Pulse (!) 108   Temp 97.6 F (36.4 C) (Oral)   Resp 18   SpO2 95%   Visual Acuity Right Eye Distance:   Left Eye Distance:   Bilateral Distance:    Right Eye Near:   Left Eye Near:    Bilateral Near:     Physical Exam Vitals reviewed.  Constitutional:      General: She is not in acute distress.    Appearance: She is not ill-appearing, toxic-appearing or diaphoretic.  HENT:     Mouth/Throat:     Mouth: Mucous membranes are moist.     Pharynx: No oropharyngeal exudate or posterior oropharyngeal erythema.  Eyes:     Extraocular Movements: Extraocular movements intact.      Conjunctiva/sclera: Conjunctivae normal.     Pupils: Pupils are equal, round, and reactive to light.  Cardiovascular:     Rate and Rhythm: Normal rate and regular rhythm.     Heart sounds: No murmur heard. Pulmonary:     Effort: Pulmonary effort is normal.     Breath sounds: Normal breath sounds.  Musculoskeletal:     Comments: There is edema of both lower extremities, but there is more on the right.  There is some very faint pink erythema of the right lower leg laterally.  She is a little tender.  Skin:    Coloration: Skin is not jaundiced or pale.  Neurological:     General: No focal deficit present.     Mental Status: She is alert and oriented to person, place, and time.  Psychiatric:        Behavior: Behavior normal.      UC Treatments / Results  Labs (all labs ordered are listed, but only abnormal results are displayed) Labs Reviewed  CBC  COMPREHENSIVE METABOLIC PANEL    EKG   Radiology No results found.  Procedures Procedures (including critical care time)  Medications Ordered in UC Medications - No data to display  Initial Impression / Assessment  and Plan / UC Course  I have reviewed the triage vital signs and the nursing notes.  Pertinent labs & imaging results that were available during my care of the patient were reviewed by me and considered in my medical decision making (see chart for details).       I have confirmed at least twice during the history that her leg is not more swollen compared to when she had her Dopplers done in the hospital.  CBC and CMP are drawn today.  I discussed with the patient that these results will not be available for 2 to 3 days.  I have asked her to take an extra dose of her spironolactone daily for 2 days.  I have also asked her to please follow-up with her primary care and her usual specialist Final Clinical Impressions(s) / UC Diagnoses   Final diagnoses:  Pedal edema  Iron deficiency anemia, unspecified iron  deficiency anemia type     Discharge Instructions      We have drawn a blood count to check for anemia and a chemistry panel to check your sodium and potassium and kidney and liver function numbers.  Staff will notify you if there is anything abnormal that needs treatment.  You can take 2 of your spironolactone 25 mg tablets once daily for the next 2 days.  Please follow-up with your primary care and your specialists about this issue     ED Prescriptions   None    PDMP not reviewed this encounter.   Zenia Resides, MD 07/02/22 [phone]

## 2022-07-04 LAB — COMPREHENSIVE METABOLIC PANEL
ALT: 22 IU/L (ref 0–32)
AST: 32 IU/L (ref 0–40)
Albumin/Globulin Ratio: 1.5 (ref 1.2–2.2)
Albumin: 3.9 g/dL (ref 3.9–4.9)
Alkaline Phosphatase: 108 IU/L (ref 44–121)
BUN/Creatinine Ratio: 8 — ABNORMAL LOW (ref 12–28)
BUN: 11 mg/dL (ref 8–27)
Bilirubin Total: <0.2 mg/dL (ref 0.0–1.2)
CO2: 23 mmol/L (ref 20–29)
Calcium: 9.7 mg/dL (ref 8.7–10.3)
Chloride: 102 mmol/L (ref 96–106)
Creatinine, Ser: 1.32 mg/dL — ABNORMAL HIGH (ref 0.57–1.00)
Globulin, Total: 2.6 g/dL (ref 1.5–4.5)
Glucose: 91 mg/dL (ref 70–99)
Potassium: 4.2 mmol/L (ref 3.5–5.2)
Sodium: 142 mmol/L (ref 134–144)
Total Protein: 6.5 g/dL (ref 6.0–8.5)
eGFR: 45 mL/min/{1.73_m2} — ABNORMAL LOW (ref >59)

## 2022-07-04 LAB — CBC
Hematocrit: 29.9 % — ABNORMAL LOW (ref 34.0–46.6)
Hemoglobin: 8.7 g/dL — ABNORMAL LOW (ref 11.1–15.9)
MCH: 23.2 pg — ABNORMAL LOW (ref 26.6–33.0)
MCHC: 29.1 g/dL — ABNORMAL LOW (ref 31.5–35.7)
MCV: 80 fL (ref 79–97)
Platelets: 155 10*3/uL (ref 150–450)
RBC: 3.75 x10E6/uL — ABNORMAL LOW (ref 3.77–5.28)
RDW: 18 % — ABNORMAL HIGH (ref 11.7–15.4)
WBC: 5.2 10*3/uL (ref 3.4–10.8)

## 2022-07-17 ENCOUNTER — Ambulatory Visit (INDEPENDENT_AMBULATORY_CARE_PROVIDER_SITE_OTHER): Payer: 59 | Admitting: Orthopedic Surgery

## 2022-07-17 ENCOUNTER — Encounter: Payer: Self-pay | Admitting: Orthopedic Surgery

## 2022-07-17 DIAGNOSIS — M12812 Other specific arthropathies, not elsewhere classified, left shoulder: Secondary | ICD-10-CM | POA: Diagnosis not present

## 2022-07-17 DIAGNOSIS — M1711 Unilateral primary osteoarthritis, right knee: Secondary | ICD-10-CM | POA: Diagnosis not present

## 2022-07-17 MED ORDER — BUPIVACAINE HCL 0.25 % IJ SOLN
4.0000 mL | INTRAMUSCULAR | Status: AC | PRN
Start: 2022-07-17 — End: 2022-07-17
  Administered 2022-07-17: 4 mL via INTRA_ARTICULAR

## 2022-07-17 MED ORDER — LIDOCAINE HCL 1 % IJ SOLN
5.0000 mL | INTRAMUSCULAR | Status: AC | PRN
Start: 2022-07-17 — End: 2022-07-17
  Administered 2022-07-17: 5 mL

## 2022-07-17 MED ORDER — BUPIVACAINE HCL 0.5 % IJ SOLN
9.0000 mL | INTRAMUSCULAR | Status: AC | PRN
Start: 2022-07-17 — End: 2022-07-17
  Administered 2022-07-17: 9 mL via INTRA_ARTICULAR

## 2022-07-17 MED ORDER — METHYLPREDNISOLONE ACETATE 40 MG/ML IJ SUSP
40.0000 mg | INTRAMUSCULAR | Status: AC | PRN
Start: 2022-07-17 — End: 2022-07-17
  Administered 2022-07-17: 40 mg via INTRA_ARTICULAR

## 2022-07-17 NOTE — Progress Notes (Signed)
Office Visit Note   Patient: [name]           Date of Birth: Apr 30, 1955           MRN: [id number] Visit Date: 07/17/2022 Requested by: Tracey Harries, MD [address] [place],  [place] [address] PCP: Tracey Harries, MD  Subjective: Chief Complaint  Patient presents with   Right Knee - Pain   Left Shoulder - Pain    HPI: [name] is a 67 y.o. female who presents to the office reporting right knee pain and left shoulder pain.  She has a known history of right knee arthritis.  Was here for Durolane injection but that was not approved by insurance.  She would like to get cortisone today.  She also has known history of left shoulder rotator cuff arthropathy.  She is trying to avoid surgery.  She had a lot of back surgeries and wants to avoid surgery if possible..                ROS: All systems reviewed are negative as they relate to the chief complaint within the history of present illness.  Patient denies fevers or chills.  Assessment & Plan: Visit Diagnoses:  1. Primary osteoarthritis of right knee   2. Rotator cuff arthropathy of left shoulder     Plan: Impression is right knee arthritis and left shoulder rotator cuff arthropathy.  Both are injected today.  Continue with stretching and nonweightbearing exercises.  She will follow-up as needed.  Follow-Up Instructions: No follow-ups on file.   Orders:  No orders of the defined types were placed in this encounter.  No orders of the defined types were placed in this encounter.     Procedures: Large Joint Inj: R knee on 07/17/2022 7:02 PM Indications: diagnostic evaluation, joint swelling and pain Details: 18 G 1.5 in needle, superolateral approach  Arthrogram: No  Medications: 5 mL lidocaine 1 %; 40 mg methylPREDNISolone acetate 40 MG/ML; 4 mL bupivacaine 0.25 % Outcome: tolerated well, no immediate complications Procedure, treatment alternatives, risks and benefits explained, specific risks  discussed. Consent was given by the patient. Immediately prior to procedure a time out was called to verify the correct patient, procedure, equipment, support staff and site/side marked as required. Patient was prepped and draped in the usual sterile fashion.    Large Joint Inj: L subacromial bursa on 07/17/2022 7:02 PM Indications: diagnostic evaluation and pain Details: 18 G 1.5 in needle, posterior approach  Arthrogram: No  Medications: 9 mL bupivacaine 0.5 %; 40 mg methylPREDNISolone acetate 40 MG/ML; 5 mL lidocaine 1 % Outcome: tolerated well, no immediate complications Procedure, treatment alternatives, risks and benefits explained, specific risks discussed. Consent was given by the patient. Immediately prior to procedure a time out was called to verify the correct patient, procedure, equipment, support staff and site/side marked as required. Patient was prepped and draped in the usual sterile fashion.       Clinical Data: No additional findings.  Objective: Vital Signs: There were no vitals taken for this visit.  Physical Exam:  Constitutional: Patient appears well-developed HEENT:  Head: Normocephalic Eyes:EOM are normal Neck: Normal range of motion Cardiovascular: Normal rate Pulmonary/chest: Effort normal Neurologic: Patient is alert Skin: Skin is warm Psychiatric: Patient has normal mood and affect  Ortho Exam: Ortho exam demonstrates on the right knee range of motion of 5 to 105 degrees.  Trace effusion.  Extensor mechanism intact.  No groin pain with  internal/external Tatian of the right leg.  Left shoulder exam demonstrates diminished rotator cuff strength consistent with known diagnosis of rotator cuff arthropathy but with passive motion maintain above 90 degrees of forward flexion and abduction.  Deltoid fires.  Specialty Comments:  No specialty comments available.  Imaging: No results found.   PMFS History: Patient Active Problem List   Diagnosis Date  Noted   Chronic diastolic CHF (congestive heart failure) (HCC) 06/25/2022   ABLA (acute blood loss anemia) 06/24/2022   Severe sepsis with acute organ dysfunction (HCC) 01/04/2020   Primary osteoarthritis of right knee 12/04/2019   Primary osteoarthritis of left knee 12/04/2019   Vitamin D deficiency 09/20/2019   Cellulitis of foot 09/18/2019   Aspiration pneumonia of both lower lobes due to gastric secretions (HCC) 06/02/2019   Lumbar disc herniation 05/04/2019   Encounter for colorectal cancer screening 04/19/2019   Acute respiratory distress 04/06/2019   Hyperglycemia 04/06/2019   Pleural effusion on right 04/06/2019   Benzodiazepine dependence (HCC) 02/16/2019   Cervical stenosis of spinal canal 02/15/2019   Streptococcal sepsis (HCC) 01/22/2019   History of sepsis 01/22/2019   Acute bronchitis due to Rhinovirus 11/06/2018   Lung collapse 11/01/2018   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 08/13/2018   Compression fracture of body of thoracic vertebra (HCC) 08/01/2018   Closed nondisplaced fracture of greater tuberosity of right humerus 07/08/2018   On rivaroxaban therapy 07/08/2018   Chronic anticoagulation 05/27/2018   Falls frequently 05/27/2018   History of DVT (deep vein thrombosis) 03/16/2018   History of pulmonary embolism 03/10/2018   Left lower quadrant abdominal pain 03/10/2018   Chronic pain syndrome 03/05/2018   COPD exacerbation (HCC) 06/09/2016   Abnormal urine odor 09/26/2015   Recurrent pulmonary embolism (HCC) 06/26/2015   Lupus anticoagulant positive 06/26/2015   Thrombocytopenia (HCC) 05/31/2015   UTI (lower urinary tract infection) 05/26/2015   Right flank pain    Patchy loss of hair 03/08/2015   Primary osteoarthritis of both knees 02/25/2015   Incarcerated incisional hernia s/p lap repair w mesh 12/10/2014 12/10/2014   DVT (deep venous thrombosis) (HCC) 10/31/2013   Pulmonary embolism (HCC) 10/26/2013   Kidney stone  10/04/2013   Tobacco abuse 10/04/2013   COPD (chronic obstructive pulmonary disease) (HCC)    Unspecified constipation 12/23/2012   Chronic kidney disease, stage 3b (HCC) 12/21/2012   Allergic rhinitis 06/29/2012   Chronic low back pain 06/29/2012   Dysuria 06/29/2012   Edema 06/29/2012   Gastro-esophageal reflux disease without esophagitis 06/29/2012   Hemorrhoids 06/29/2012   Hirsutism 06/29/2012   Insomnia 06/29/2012   Localized superficial swelling, mass, or lump 06/29/2012   Urinary incontinence 06/29/2012   Skin sensation disturbance 06/29/2012   Shortness of breath 06/29/2012   Pulmonary embolism and infarction (HCC) 06/29/2012   Proteinuria 06/29/2012   Nondependent cannabis abuse 06/29/2012   Acquired cyst of kidney 10/24/2011   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   Cocaine abuse (HCC) 11/27/2010   Depressive disorder 09/23/2009   Ascites 09/23/2009   Obesity 09/01/2008   Chronic hepatitis B with cirrhosis (HCC) 08/31/2008   Acute hepatitis C virus infection 08/31/2008   Iron deficiency anemia due to chronic blood loss 08/31/2008   Generalized anxiety disorder 08/31/2008   Essential hypertension 08/31/2008   Coronary atherosclerosis 08/31/2008   Hepatic cirrhosis (HCC) 08/31/2008   History of hepatitis C 08/31/2008   Past Medical History:  Diagnosis Date   Agoraphobia with panic attacks    Altered mental state  10/26/2013   Ankle fracture, left 11/27/2010   S/p ORIF 11/2    Anxiety    Arthritis    "knees; lower back" (10/30/2013)   Bipolar disorder (HCC)    Cavitary pneumonia    Cirrhosis (HCC)    CKD (chronic kidney disease), stage III (HCC)    COPD (chronic obstructive pulmonary disease) (HCC)    Depression    DVT (deep venous thrombosis) (HCC) 09/2013   RLE   GERD (gastroesophageal reflux disease)    Heart murmur    Hepatitis B    History of blood transfusion    "due to excessive blood loss before hysterectomy"   History of urinary tract infection     Hypertension    currently on no medication    Kidney stones    Pneumonia    "4 times in the past year" (10/30/2013)   Pulmonary emboli (HCC) 09/2013   Shortness of breath dyspnea    increased exertion;exercise   Urinary frequency    Urinary incontinence     Family History  Problem Relation Age of Onset   Stroke Mother    Lung cancer Father     Past Surgical History:  Procedure Laterality Date   ABDOMINAL HYSTERECTOMY     ANKLE FRACTURE SURGERY Left    BIOPSY  06/26/2022   Procedure: BIOPSY;  Surgeon: Vida Rigger, MD;  Location: [facility];  Service: Gastroenterology;;   CARPAL TUNNEL RELEASE Left    DILATION AND CURETTAGE OF UTERUS  X 2   ESOPHAGOGASTRODUODENOSCOPY (EGD) WITH PROPOFOL N/A 06/26/2022   Procedure: ESOPHAGOGASTRODUODENOSCOPY (EGD) WITH PROPOFOL;  Surgeon: Vida Rigger, MD;  Location: [facility];  Service: Gastroenterology;  Laterality: N/A;   FRACTURE SURGERY     HAND SURGERY     1980's/left hand    INSERTION OF MESH N/A 12/10/2014   Procedure: INSERTION OF MESH;  Surgeon: Karie Soda, MD;  Location: [facility];  Service: General;  Laterality: N/A;   LAPAROSCOPIC ASSISTED VENTRAL HERNIA REPAIR N/A 12/10/2014   Procedure: LAPAROSCOPIC ASSISTED REPAIR OF INCARCERATED UMBILICAL HERNIA ;  Surgeon: Karie Soda, MD;  Location: [facility];  Service: General;  Laterality: N/A;   TUBAL LIGATION     Social History   Occupational History   Not on file  Tobacco Use   Smoking status: Former    Packs/day: 0.50    Years: 35.00    Additional pack years: 0.00    Total pack years: 17.50    Types: Cigarettes    Quit date: 10/26/2013    Years since quitting: 8.7   Smokeless tobacco: Never  Vaping Use   Vaping Use: Some days   Substances: Nicotine, Flavoring  Substance and Sexual Activity   Alcohol use: No    Alcohol/week: 0.0 standard drinks of alcohol    Comment: "stopped drinking  in 2004"   Drug use: No    Types: "Crack" cocaine, Marijuana    Comment: 10/30/2013 "last  crack in 2014; last marijuana in ~ 1990" drug free for last 3 years    Sexual activity: Never

## 2022-07-18 ENCOUNTER — Telehealth: Payer: Self-pay

## 2022-07-18 NOTE — Telephone Encounter (Signed)
Patient approved for right knee durolane. Was scheduled on 06/24 but durolane out of stock. To call patient once we receive them so she can get gel injection

## 2022-07-20 NOTE — Telephone Encounter (Signed)
Durolane in stock. IC LMVM for patient to call us back to schedule appt for bil knee gel.

## 2022-08-08 ENCOUNTER — Inpatient Hospital Stay (HOSPITAL_COMMUNITY)
Admission: EM | Admit: 2022-08-08 | Discharge: 2022-08-09 | DRG: 176 | Disposition: A | Discharge status: Home / Self Care | Payer: 59 | Attending: Family Medicine | Admitting: Family Medicine

## 2022-08-08 DIAGNOSIS — M79604 Pain in right leg: Secondary | ICD-10-CM | POA: Diagnosis not present

## 2022-08-08 DIAGNOSIS — B181 Chronic viral hepatitis B without delta-agent: Secondary | ICD-10-CM | POA: Diagnosis present

## 2022-08-08 DIAGNOSIS — Z801 Family history of malignant neoplasm of trachea, bronchus and lung: Secondary | ICD-10-CM

## 2022-08-08 DIAGNOSIS — Z87440 Personal history of urinary (tract) infections: Secondary | ICD-10-CM

## 2022-08-08 DIAGNOSIS — Z87891 Personal history of nicotine dependence: Secondary | ICD-10-CM

## 2022-08-08 DIAGNOSIS — M170 Bilateral primary osteoarthritis of knee: Secondary | ICD-10-CM | POA: Diagnosis present

## 2022-08-08 DIAGNOSIS — Z79899 Other long term (current) drug therapy: Secondary | ICD-10-CM

## 2022-08-08 DIAGNOSIS — Z86718 Personal history of other venous thrombosis and embolism: Secondary | ICD-10-CM

## 2022-08-08 DIAGNOSIS — I10 Essential (primary) hypertension: Secondary | ICD-10-CM | POA: Diagnosis present

## 2022-08-08 DIAGNOSIS — F319 Bipolar disorder, unspecified: Secondary | ICD-10-CM | POA: Diagnosis present

## 2022-08-08 DIAGNOSIS — K7460 Unspecified cirrhosis of liver: Secondary | ICD-10-CM | POA: Diagnosis present

## 2022-08-08 DIAGNOSIS — K219 Gastro-esophageal reflux disease without esophagitis: Secondary | ICD-10-CM | POA: Diagnosis present

## 2022-08-08 DIAGNOSIS — N183 Chronic kidney disease, stage 3 unspecified: Secondary | ICD-10-CM | POA: Diagnosis present

## 2022-08-08 DIAGNOSIS — M7989 Other specified soft tissue disorders: Secondary | ICD-10-CM | POA: Diagnosis present

## 2022-08-08 DIAGNOSIS — I2699 Other pulmonary embolism without acute cor pulmonale: Secondary | ICD-10-CM | POA: Diagnosis not present

## 2022-08-08 DIAGNOSIS — Z7951 Long term (current) use of inhaled steroids: Secondary | ICD-10-CM

## 2022-08-08 DIAGNOSIS — Z87442 Personal history of urinary calculi: Secondary | ICD-10-CM

## 2022-08-08 DIAGNOSIS — I129 Hypertensive chronic kidney disease with stage 1 through stage 4 chronic kidney disease, or unspecified chronic kidney disease: Secondary | ICD-10-CM | POA: Diagnosis present

## 2022-08-08 DIAGNOSIS — N1832 Chronic kidney disease, stage 3b: Secondary | ICD-10-CM | POA: Diagnosis present

## 2022-08-08 DIAGNOSIS — Z86711 Personal history of pulmonary embolism: Secondary | ICD-10-CM

## 2022-08-08 DIAGNOSIS — F4001 Agoraphobia with panic disorder: Secondary | ICD-10-CM | POA: Diagnosis present

## 2022-08-08 DIAGNOSIS — J449 Chronic obstructive pulmonary disease, unspecified: Secondary | ICD-10-CM | POA: Diagnosis present

## 2022-08-08 DIAGNOSIS — Z90710 Acquired absence of both cervix and uterus: Secondary | ICD-10-CM

## 2022-08-08 DIAGNOSIS — Z8701 Personal history of pneumonia (recurrent): Secondary | ICD-10-CM

## 2022-08-08 DIAGNOSIS — M47816 Spondylosis without myelopathy or radiculopathy, lumbar region: Secondary | ICD-10-CM | POA: Diagnosis present

## 2022-08-08 DIAGNOSIS — F32A Depression, unspecified: Secondary | ICD-10-CM | POA: Diagnosis present

## 2022-08-08 DIAGNOSIS — Z7901 Long term (current) use of anticoagulants: Secondary | ICD-10-CM

## 2022-08-08 DIAGNOSIS — Z888 Allergy status to other drugs, medicaments and biological substances status: Secondary | ICD-10-CM

## 2022-08-08 DIAGNOSIS — Z881 Allergy status to other antibiotic agents status: Secondary | ICD-10-CM

## 2022-08-08 DIAGNOSIS — K2970 Gastritis, unspecified, without bleeding: Secondary | ICD-10-CM | POA: Diagnosis present

## 2022-08-09 ENCOUNTER — Inpatient Hospital Stay (HOSPITAL_COMMUNITY): Payer: 59

## 2022-08-09 ENCOUNTER — Emergency Department (HOSPITAL_COMMUNITY): Payer: 59

## 2022-08-09 ENCOUNTER — Other Ambulatory Visit: Payer: Self-pay

## 2022-08-09 ENCOUNTER — Encounter (HOSPITAL_COMMUNITY): Payer: Self-pay

## 2022-08-09 DIAGNOSIS — Z7901 Long term (current) use of anticoagulants: Secondary | ICD-10-CM | POA: Diagnosis not present

## 2022-08-09 DIAGNOSIS — M79661 Pain in right lower leg: Secondary | ICD-10-CM

## 2022-08-09 DIAGNOSIS — M7989 Other specified soft tissue disorders: Secondary | ICD-10-CM | POA: Diagnosis not present

## 2022-08-09 DIAGNOSIS — Z801 Family history of malignant neoplasm of trachea, bronchus and lung: Secondary | ICD-10-CM | POA: Diagnosis not present

## 2022-08-09 DIAGNOSIS — Z881 Allergy status to other antibiotic agents status: Secondary | ICD-10-CM | POA: Diagnosis not present

## 2022-08-09 DIAGNOSIS — N1832 Chronic kidney disease, stage 3b: Secondary | ICD-10-CM | POA: Diagnosis present

## 2022-08-09 DIAGNOSIS — I10 Essential (primary) hypertension: Secondary | ICD-10-CM | POA: Diagnosis not present

## 2022-08-09 DIAGNOSIS — F32A Depression, unspecified: Secondary | ICD-10-CM

## 2022-08-09 DIAGNOSIS — Z7951 Long term (current) use of inhaled steroids: Secondary | ICD-10-CM | POA: Diagnosis not present

## 2022-08-09 DIAGNOSIS — M47816 Spondylosis without myelopathy or radiculopathy, lumbar region: Secondary | ICD-10-CM | POA: Diagnosis present

## 2022-08-09 DIAGNOSIS — Z87442 Personal history of urinary calculi: Secondary | ICD-10-CM | POA: Diagnosis not present

## 2022-08-09 DIAGNOSIS — F319 Bipolar disorder, unspecified: Secondary | ICD-10-CM | POA: Diagnosis present

## 2022-08-09 DIAGNOSIS — I2699 Other pulmonary embolism without acute cor pulmonale: Secondary | ICD-10-CM | POA: Diagnosis present

## 2022-08-09 DIAGNOSIS — Z90710 Acquired absence of both cervix and uterus: Secondary | ICD-10-CM | POA: Diagnosis not present

## 2022-08-09 DIAGNOSIS — K7460 Unspecified cirrhosis of liver: Secondary | ICD-10-CM | POA: Diagnosis present

## 2022-08-09 DIAGNOSIS — K219 Gastro-esophageal reflux disease without esophagitis: Secondary | ICD-10-CM | POA: Diagnosis present

## 2022-08-09 DIAGNOSIS — B181 Chronic viral hepatitis B without delta-agent: Secondary | ICD-10-CM

## 2022-08-09 DIAGNOSIS — K2970 Gastritis, unspecified, without bleeding: Secondary | ICD-10-CM | POA: Diagnosis present

## 2022-08-09 DIAGNOSIS — Z86718 Personal history of other venous thrombosis and embolism: Secondary | ICD-10-CM | POA: Diagnosis not present

## 2022-08-09 DIAGNOSIS — Z87891 Personal history of nicotine dependence: Secondary | ICD-10-CM | POA: Diagnosis not present

## 2022-08-09 DIAGNOSIS — F4001 Agoraphobia with panic disorder: Secondary | ICD-10-CM | POA: Diagnosis present

## 2022-08-09 DIAGNOSIS — M170 Bilateral primary osteoarthritis of knee: Secondary | ICD-10-CM | POA: Diagnosis present

## 2022-08-09 DIAGNOSIS — Z888 Allergy status to other drugs, medicaments and biological substances status: Secondary | ICD-10-CM | POA: Diagnosis not present

## 2022-08-09 DIAGNOSIS — J449 Chronic obstructive pulmonary disease, unspecified: Secondary | ICD-10-CM | POA: Diagnosis present

## 2022-08-09 DIAGNOSIS — I129 Hypertensive chronic kidney disease with stage 1 through stage 4 chronic kidney disease, or unspecified chronic kidney disease: Secondary | ICD-10-CM | POA: Diagnosis present

## 2022-08-09 DIAGNOSIS — Z8701 Personal history of pneumonia (recurrent): Secondary | ICD-10-CM | POA: Diagnosis not present

## 2022-08-09 DIAGNOSIS — Z86711 Personal history of pulmonary embolism: Secondary | ICD-10-CM | POA: Diagnosis not present

## 2022-08-09 DIAGNOSIS — J439 Emphysema, unspecified: Secondary | ICD-10-CM

## 2022-08-09 DIAGNOSIS — M79604 Pain in right leg: Secondary | ICD-10-CM | POA: Diagnosis present

## 2022-08-09 DIAGNOSIS — Z87440 Personal history of urinary (tract) infections: Secondary | ICD-10-CM | POA: Diagnosis not present

## 2022-08-09 LAB — COMPREHENSIVE METABOLIC PANEL
ALT: 33 U/L (ref 0–44)
AST: 26 U/L (ref 15–41)
Albumin: 3.2 g/dL — ABNORMAL LOW (ref 3.5–5.0)
Alkaline Phosphatase: 102 U/L (ref 38–126)
Anion gap: 9 (ref 5–15)
BUN: 16 mg/dL (ref 8–23)
CO2: 27 mmol/L (ref 22–32)
Calcium: 9.2 mg/dL (ref 8.9–10.3)
Chloride: 102 mmol/L (ref 98–111)
Creatinine, Ser: 1.58 mg/dL — ABNORMAL HIGH (ref 0.44–1.00)
GFR, Estimated: 36 mL/min — ABNORMAL LOW (ref >60)
Glucose, Bld: 113 mg/dL — ABNORMAL HIGH (ref 70–99)
Potassium: 4 mmol/L (ref 3.5–5.1)
Sodium: 138 mmol/L (ref 135–145)
Total Bilirubin: 0.3 mg/dL (ref 0.3–1.2)
Total Protein: 6.3 g/dL — ABNORMAL LOW (ref 6.5–8.1)

## 2022-08-09 LAB — I-STAT VENOUS BLOOD GAS, ED
Acid-Base Excess: 2 mmol/L (ref 0.0–2.0)
Bicarbonate: 29.9 mmol/L — ABNORMAL HIGH (ref 20.0–28.0)
Calcium, Ion: 1.22 mmol/L (ref 1.15–1.40)
HCT: 37 % (ref 36.0–46.0)
Hemoglobin: 12.6 g/dL (ref 12.0–15.0)
O2 Saturation: 83 %
Potassium: 4.1 mmol/L (ref 3.5–5.1)
Sodium: 139 mmol/L (ref 135–145)
TCO2: 32 mmol/L (ref 22–32)
pCO2, Ven: 58.9 mmHg (ref 44–60)
pH, Ven: 7.313 (ref 7.25–7.43)
pO2, Ven: 53 mmHg — ABNORMAL HIGH (ref 32–45)

## 2022-08-09 LAB — CBC WITH DIFFERENTIAL/PLATELET
Abs Immature Granulocytes: 0.03 10*3/uL (ref 0.00–0.07)
Basophils Absolute: 0 10*3/uL (ref 0.0–0.1)
Basophils Relative: 0 %
Eosinophils Absolute: 0.1 10*3/uL (ref 0.0–0.5)
Eosinophils Relative: 1 %
HCT: 37.1 % (ref 36.0–46.0)
Hemoglobin: 10.9 g/dL — ABNORMAL LOW (ref 12.0–15.0)
Immature Granulocytes: 1 %
Lymphocytes Relative: 28 %
Lymphs Abs: 1.4 10*3/uL (ref 0.7–4.0)
MCH: 26.3 pg (ref 26.0–34.0)
MCHC: 29.4 g/dL — ABNORMAL LOW (ref 30.0–36.0)
MCV: 89.6 fL (ref 80.0–100.0)
Monocytes Absolute: 0.4 10*3/uL (ref 0.1–1.0)
Monocytes Relative: 7 %
Neutro Abs: 3.1 10*3/uL (ref 1.7–7.7)
Neutrophils Relative %: 63 %
Platelets: 143 10*3/uL — ABNORMAL LOW (ref 150–400)
RBC: 4.14 MIL/uL (ref 3.87–5.11)
RDW: 22.5 % — ABNORMAL HIGH (ref 11.5–15.5)
WBC: 5 10*3/uL (ref 4.0–10.5)
nRBC: 0 % (ref 0.0–0.2)

## 2022-08-09 LAB — BRAIN NATRIURETIC PEPTIDE: B Natriuretic Peptide: 22.3 pg/mL (ref 0.0–100.0)

## 2022-08-09 LAB — D-DIMER, QUANTITATIVE: D-Dimer, Quant: 1.9 ug/mL-FEU — ABNORMAL HIGH (ref 0.00–0.50)

## 2022-08-09 LAB — TROPONIN I (HIGH SENSITIVITY)
Troponin I (High Sensitivity): 10 ng/L (ref <18)
Troponin I (High Sensitivity): 10 ng/L (ref <18)

## 2022-08-09 LAB — CBG MONITORING, ED: Glucose-Capillary: 106 mg/dL — ABNORMAL HIGH (ref 70–99)

## 2022-08-09 LAB — CK: Total CK: 180 U/L (ref 38–234)

## 2022-08-09 MED ORDER — DOCUSATE SODIUM 100 MG PO CAPS
100.0000 mg | ORAL_CAPSULE | Freq: Two times a day (BID) | ORAL | Status: DC
  Administered 2022-08-09: 100 mg via ORAL
  Filled 2022-08-09: qty 1

## 2022-08-09 MED ORDER — CARVEDILOL 12.5 MG PO TABS
12.5000 mg | ORAL_TABLET | Freq: Two times a day (BID) | ORAL | Status: DC
  Filled 2022-08-09: qty 1

## 2022-08-09 MED ORDER — GABAPENTIN 300 MG PO CAPS: 300.0000 mg | ORAL_CAPSULE | Freq: Three times a day (TID) | ORAL | Status: DC

## 2022-08-09 MED ORDER — SODIUM CHLORIDE 0.9% FLUSH
3.0000 mL | Freq: Two times a day (BID) | INTRAVENOUS | Status: DC
  Administered 2022-08-09: 3 mL via INTRAVENOUS

## 2022-08-09 MED ORDER — DOXEPIN HCL 25 MG PO CAPS
100.0000 mg | ORAL_CAPSULE | Freq: Every day | ORAL | Status: DC
  Filled 2022-08-09: qty 1

## 2022-08-09 MED ORDER — MONTELUKAST SODIUM 10 MG PO TABS: 10.0000 mg | ORAL_TABLET | Freq: Every day | ORAL | Status: DC

## 2022-08-09 MED ORDER — HEPARIN BOLUS VIA INFUSION
4000.0000 [IU] | Freq: Once | INTRAVENOUS | Status: DC
  Filled 2022-08-09: qty 4000

## 2022-08-09 MED ORDER — ONDANSETRON HCL 4 MG/2ML IJ SOLN
4.0000 mg | Freq: Once | INTRAMUSCULAR | Status: AC
  Administered 2022-08-09: 4 mg via INTRAVENOUS
  Filled 2022-08-09: qty 2

## 2022-08-09 MED ORDER — BACLOFEN 10 MG PO TABS: 20.0000 mg | ORAL_TABLET | Freq: Every day | ORAL | Status: DC

## 2022-08-09 MED ORDER — IOHEXOL 350 MG/ML SOLN
75.0000 mL | Freq: Once | INTRAVENOUS | Status: AC | PRN
  Administered 2022-08-09: 75 mL via INTRAVENOUS

## 2022-08-09 MED ORDER — ACETAMINOPHEN 650 MG RE SUPP: 650.0000 mg | Freq: Four times a day (QID) | RECTAL | Status: DC | PRN

## 2022-08-09 MED ORDER — HYDROMORPHONE HCL 1 MG/ML IJ SOLN
0.5000 mg | INTRAMUSCULAR | Status: DC | PRN
  Administered 2022-08-09: 1 mg via INTRAVENOUS
  Filled 2022-08-09: qty 1

## 2022-08-09 MED ORDER — ALPRAZOLAM 0.25 MG PO TABS: 1.0000 mg | ORAL_TABLET | Freq: Three times a day (TID) | ORAL | Status: DC

## 2022-08-09 MED ORDER — HYDROCODONE-ACETAMINOPHEN 5-325 MG PO TABS: 1.0000 | ORAL_TABLET | Freq: Three times a day (TID) | ORAL | 0 refills | Status: AC

## 2022-08-09 MED ORDER — ENTECAVIR 0.5 MG PO TABS: 0.5000 mg | ORAL_TABLET | ORAL | Status: DC

## 2022-08-09 MED ORDER — MORPHINE SULFATE (PF) 4 MG/ML IV SOLN
4.0000 mg | Freq: Once | INTRAVENOUS | Status: AC
  Administered 2022-08-09: 4 mg via INTRAVENOUS
  Filled 2022-08-09: qty 1

## 2022-08-09 MED ORDER — ACETAMINOPHEN 325 MG PO TABS: 650.0000 mg | ORAL_TABLET | Freq: Four times a day (QID) | ORAL | Status: DC | PRN

## 2022-08-09 MED ORDER — MOMETASONE FURO-FORMOTEROL FUM 200-5 MCG/ACT IN AERO
2.0000 | INHALATION_SPRAY | Freq: Two times a day (BID) | RESPIRATORY_TRACT | Status: DC
  Filled 2022-08-09: qty 8.8

## 2022-08-09 MED ORDER — PANTOPRAZOLE SODIUM 40 MG PO TBEC
40.0000 mg | DELAYED_RELEASE_TABLET | Freq: Two times a day (BID) | ORAL | Status: DC
  Filled 2022-08-09: qty 1

## 2022-08-09 MED ORDER — SUCRALFATE 1 G PO TABS
1.0000 g | ORAL_TABLET | Freq: Three times a day (TID) | ORAL | Status: DC
  Filled 2022-08-09: qty 1

## 2022-08-09 MED ORDER — ALBUTEROL SULFATE (2.5 MG/3ML) 0.083% IN NEBU: 2.5000 mg | INHALATION_SOLUTION | RESPIRATORY_TRACT | Status: DC | PRN

## 2022-08-09 MED ORDER — HEPARIN (PORCINE) 25000 UT/250ML-% IV SOLN: 1500.0000 [IU]/h | INTRAVENOUS | Status: DC

## 2022-08-09 MED ORDER — BUPROPION HCL ER (XL) 150 MG PO TB24
150.0000 mg | ORAL_TABLET | Freq: Every morning | ORAL | Status: DC
  Administered 2022-08-09: 150 mg via ORAL
  Filled 2022-08-09: qty 1

## 2022-08-09 MED ORDER — OXYCODONE HCL 5 MG PO TABS: 5.0000 mg | ORAL_TABLET | ORAL | Status: DC | PRN

## 2022-08-09 MED ORDER — ALUM & MAG HYDROXIDE-SIMETH 200-200-20 MG/5ML PO SUSP
30.0000 mL | Freq: Once | ORAL | Status: AC
  Administered 2022-08-09: 30 mL via ORAL
  Filled 2022-08-09: qty 30

## 2022-08-09 MED ORDER — RIVAROXABAN 15 MG PO TABS
15.0000 mg | ORAL_TABLET | Freq: Two times a day (BID) | ORAL | Status: DC
  Administered 2022-08-09: 15 mg via ORAL
  Filled 2022-08-09: qty 1

## 2022-08-09 MED ORDER — RIVAROXABAN 10 MG PO TABS: 20.0000 mg | ORAL_TABLET | Freq: Every day | ORAL | Status: DC

## 2022-08-09 MED ORDER — ONDANSETRON HCL 4 MG/2ML IJ SOLN
4.0000 mg | Freq: Four times a day (QID) | INTRAMUSCULAR | Status: DC | PRN
  Administered 2022-08-09: 4 mg via INTRAVENOUS
  Filled 2022-08-09: qty 2

## 2022-08-09 MED ORDER — ONDANSETRON HCL 4 MG PO TABS: 4.0000 mg | ORAL_TABLET | Freq: Four times a day (QID) | ORAL | Status: DC | PRN

## 2022-08-09 MED ORDER — RIVAROXABAN (XARELTO) VTE STARTER PACK (15 & 20 MG): ORAL_TABLET | ORAL | 0 refills | Status: DC

## 2022-08-09 MED ORDER — POLYETHYLENE GLYCOL 3350 17 G PO PACK: 17.0000 g | PACK | Freq: Every day | ORAL | Status: DC | PRN

## 2022-08-09 NOTE — H&P (Addendum)
History and Physical    [name]:[id number] DOB: May 20, 1955 DOA: 08/08/2022  PCP: Tracey Harries, MD   Patient coming from: Home   Chief Complaint: Right leg pain and swelling   HPI: [name] is a 67 y.o. 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  Patient reports that she was experiencing leg swelling roughly a month ago and was started on Lasix with good effect initially.  She then stopped Xarelto for 5 days in preparation for a tooth extraction that was to occur yesterday, but by this time the right leg had began to swell again and was becoming progressively more painful.  She was unable to walk yesterday due to the pain and had to cancel the tooth extraction.  She then went on to develop chest discomfort and lightheadedness upon standing yesterday and called EMS.  ED Course: Upon arrival to the ED, patient is found to be afebrile and saturating well on room air with normal heart rate and elevated blood pressure.  Plain radiographs of the right foot and lower leg are negative for acute findings.  CTA chest is notable for acute occlusive PE in the right lower lobe affecting segmental branches and without right heart dilation.  Labs are most notable for creatinine 1.58, hemoglobin 10.9, platelets 143,000, normal troponin x 2, and normal BNP.  Patient was given 2 doses of morphine in the ED.  Review of Systems:  All other systems reviewed and apart from HPI, are negative.  Past Medical History:  Diagnosis Date   Agoraphobia with panic attacks    Altered mental state 10/26/2013   Ankle fracture, left 11/27/2010   S/p ORIF 11/2    Anxiety    Arthritis    "knees; lower back" (10/30/2013)   Bipolar disorder (HCC)    Cavitary pneumonia    Cirrhosis (HCC)    CKD  (chronic kidney disease), stage III (HCC)    COPD (chronic obstructive pulmonary disease) (HCC)    Depression    DVT (deep venous thrombosis) (HCC) 09/2013   RLE   GERD (gastroesophageal reflux disease)    Heart murmur    Hepatitis B    History of blood transfusion    "due to excessive blood loss before hysterectomy"   History of urinary tract infection    Hypertension    currently on no medication    Kidney stones    Pneumonia    "4 times in the past year" (10/30/2013)   Pulmonary emboli (HCC) 09/2013   Shortness of breath dyspnea    increased exertion;exercise   Urinary frequency    Urinary incontinence     Past Surgical History:  Procedure Laterality Date   ABDOMINAL HYSTERECTOMY     ANKLE FRACTURE SURGERY Left    BIOPSY  06/26/2022   Procedure: BIOPSY;  Surgeon: Vida Rigger, MD;  Location: [facility];  Service: Gastroenterology;;   CARPAL TUNNEL RELEASE Left    DILATION AND CURETTAGE OF UTERUS  X 2   ESOPHAGOGASTRODUODENOSCOPY (EGD) WITH PROPOFOL N/A 06/26/2022   Procedure: ESOPHAGOGASTRODUODENOSCOPY (EGD) WITH PROPOFOL;  Surgeon: Vida Rigger, MD;  Location: [facility];  Service: Gastroenterology;  Laterality: N/A;   FRACTURE SURGERY     HAND SURGERY     1980's/left hand    INSERTION OF MESH N/A 12/10/2014   Procedure: INSERTION  OF MESH;  Surgeon: Karie Soda, MD;  Location: [facility];  Service: General;  Laterality: N/A;   LAPAROSCOPIC ASSISTED VENTRAL HERNIA REPAIR N/A 12/10/2014   Procedure: LAPAROSCOPIC ASSISTED REPAIR OF INCARCERATED UMBILICAL HERNIA ;  Surgeon: Karie Soda, MD;  Location: [facility];  Service: General;  Laterality: N/A;   TUBAL LIGATION      Social History:   reports that she quit smoking about 8 years ago. Her smoking use included cigarettes. She started smoking about 43 years ago. She has a 17.5 pack-year smoking history. She has never used smokeless tobacco. She reports that she does not drink alcohol and does not use drugs.  Allergies  Allergen  Reactions   Amitriptyline Hcl Other (See Comments)     Causes her to be very "disoriented".   Azithromycin Rash   Fentanyl Other (See Comments)    Sees things Pt stated had hallucinations "d/t taking high dosage, but can tolerate low dosage"    Cymbalta [Duloxetine Hcl] Other (See Comments)    Headaches    Neomycin-Bacitracin Zn-Polymyx Rash    Family History  Problem Relation Age of Onset   Stroke Mother    Lung cancer Father      Prior to Admission medications   Medication Sig Start Date End Date Taking? Authorizing Provider  albuterol (PROAIR HFA) 108 (90 Base) MCG/ACT inhaler INHALE 2 PUFFS INTO THE LUNGS EVERY 4 HOURS AS NEEDED FOR WHEEZING Patient taking differently: Inhale 2 puffs into the lungs every 4 (four) hours as needed for shortness of breath or wheezing. 12/04/18   Oretha Milch, MD  albuterol (PROVENTIL) (2.5 MG/3ML) 0.083% nebulizer solution Take 3 mLs (2.5 mg total) by nebulization every 4 (four) hours as needed for wheezing or shortness of breath. 12/04/18   Oretha Milch, MD  ALPRAZolam Prudy Feeler) 1 MG tablet Take 1 tablet (1 mg total) by mouth 3 (three) times daily. 06/27/22   Shalhoub, Deno Lunger, MD  baclofen (LIORESAL) 20 MG tablet Take 20 mg by mouth at bedtime. 04/22/20   [provider]  budesonide-formoterol (SYMBICORT) 160-4.5 MCG/ACT inhaler Inhale 2 puffs into the lungs 2 (two) times daily. 12/04/18 06/24/22  Oretha Milch, MD  buPROPion (WELLBUTRIN XL) 150 MG 24 hr tablet Take 150 mg by mouth every morning.    [provider]  carvedilol (COREG) 12.5 MG tablet Take 1 tablet (12.5 mg total) by mouth 2 (two) times daily with a meal. 06/27/22   Shalhoub, Deno Lunger, MD  Cholecalciferol (VITAMIN D3) 50 MCG (2000 UT) TABS Take 2,000 Units by mouth daily.    [provider]  clotrimazole-betamethasone (LOTRISONE) cream Apply to affected area 2 times daily prn Patient taking differently: Apply 1 application topically 2 (two) times daily as  needed (rash). Apply to affected area 2 times daily prn 04/29/20   Cathren Laine, MD  docusate sodium (COLACE) 100 MG capsule Take 100 mg by mouth 2 (two) times daily.    [provider]  doxepin (SINEQUAN) 100 MG capsule Take 100 mg by mouth at bedtime. 06/01/22   [provider]  entecavir (BARACLUDE) 0.5 MG tablet Take 0.5 mg by mouth every other day. 11/25/19   [provider]  famotidine (PEPCID) 20 MG tablet Take 20 mg by mouth 2 (two) times daily. 10/28/19   [provider]  gabapentin (NEURONTIN) 300 MG capsule Take 300 mg by mouth 3 (three) times daily. 07/13/20   [provider]  levocetirizine (XYZAL) 5 MG tablet Take 5 mg by  mouth every evening. 12/02/19   [provider]  montelukast (SINGULAIR) 10 MG tablet Take 10 mg by mouth at bedtime.    [provider]  ondansetron (ZOFRAN ODT) 4 MG disintegrating tablet Take 1 tablet (4 mg total) by mouth every 8 (eight) hours as needed for nausea or vomiting. 07/18/20   Sabas Sous, MD  pantoprazole (PROTONIX) 40 MG tablet Take 40 mg by mouth 2 (two) times daily before a meal. 12/23/13   [provider]  rivaroxaban (XARELTO) 20 MG TABS tablet Take 20 mg by mouth daily.    [provider]  spironolactone (ALDACTONE) 25 MG tablet Take 1 tablet (25 mg total) by mouth daily. 06/27/22   Shalhoub, Deno Lunger, MD  sucralfate (CARAFATE) 1 g tablet Take 1 g by mouth 4 (four) times daily. 07/22/19   [provider]  valACYclovir (VALTREX) 500 MG tablet Take 500 mg by mouth daily as needed (breakout). 08/21/17   [provider]  enoxaparin (LOVENOX) 120 MG/0.8ML SOLN Inject 0.8 mLs (120 mg total) into the skin every 12 (twelve) hours. 11/30/10 04/16/11  Daryel Gerald, MD  loratadine (CLARITIN) 10 MG tablet Take 10 mg by mouth daily.    04/16/11  [provider]    Physical Exam: Vitals:   08/09/22 0315 08/09/22 0330 08/09/22 0349 08/09/22 0415   BP:  (!) 169/98  (!) 140/95  Pulse: 79 79  84  Resp:  20  16  Temp:  98 F (36.7 C) 98 F (36.7 C)   TempSrc:   Oral   SpO2:  98%  98%  Height:        Constitutional: NAD, no pallor or diaphoresis   Eyes: PERTLA, lids and conjunctivae normal ENMT: Mucous membranes are moist. Posterior pharynx clear of any exudate or lesions.   Neck: supple, no masses  Respiratory: no wheezing, no crackles. No accessory muscle use.  Cardiovascular: S1 & S2 heard, regular rate and rhythm. Right > left lower extremity edema.  Abdomen: Distended, no tenderness, soft. Bowel sounds active.  Musculoskeletal: no clubbing / cyanosis. No joint deformity upper and lower extremities.   Skin: no significant rashes, lesions, ulcers. Warm, dry, well-perfused. Neurologic: CN 2-12 grossly intact. Moving all extremities. Alert and oriented.  Psychiatric: Tearful. Cooperative.    Labs and Imaging on Admission: I have personally reviewed following labs and imaging studies  CBC: Recent Labs  Lab 08/09/22 0019  WBC 5.0  NEUTROABS 3.1  HGB 10.9*  HCT 37.1  MCV 89.6  PLT 143*   Basic Metabolic Panel: Recent Labs  Lab 08/09/22 0019  NA 138  K 4.0  CL 102  CO2 27  GLUCOSE 113*  BUN 16  CREATININE 1.58*  CALCIUM 9.2   GFR: CrCl cannot be calculated (Unknown ideal weight.). Liver Function Tests: Recent Labs  Lab 08/09/22 0019  AST 26  ALT 33  ALKPHOS 102  BILITOT 0.3  PROT 6.3*  ALBUMIN 3.2*   No results for input(s): "LIPASE", "AMYLASE" in the last 168 hours. No results for input(s): "AMMONIA" in the last 168 hours. Coagulation Profile: No results for input(s): "INR", "PROTIME" in the last 168 hours. Cardiac Enzymes: Recent Labs  Lab 08/09/22 0019  CKTOTAL 180   BNP (last 3 results) No results for input(s): "PROBNP" in the last 8760 hours. HbA1C: No results for input(s): "HGBA1C" in the last 72 hours. CBG: No results for input(s): "GLUCAP" in the last 168 hours. Lipid  Profile: No results for input(s): "CHOL", "HDL", "  LDLCALC", "TRIG", "CHOLHDL", "LDLDIRECT" in the last 72 hours. Thyroid Function Tests: No results for input(s): "TSH", "T4TOTAL", "FREET4", "T3FREE", "THYROIDAB" in the last 72 hours. Anemia Panel: No results for input(s): "VITAMINB12", "FOLATE", "FERRITIN", "TIBC", "IRON", "RETICCTPCT" in the last 72 hours. Urine analysis:    Component Value Date/Time   COLORURINE STRAW (A) 05/28/2021 2110   APPEARANCEUR CLEAR 05/28/2021 2110   LABSPEC 1.002 (L) 05/28/2021 2110   PHURINE 6.0 05/28/2021 2110   GLUCOSEU NEGATIVE 05/28/2021 2110   HGBUR MODERATE (A) 05/28/2021 2110   BILIRUBINUR NEGATIVE 05/28/2021 2110   KETONESUR 5 (A) 05/28/2021 2110   PROTEINUR NEGATIVE 05/28/2021 2110   UROBILINOGEN 1.0 04/03/2014 1645   NITRITE NEGATIVE 05/28/2021 2110   LEUKOCYTESUR NEGATIVE 05/28/2021 2110   Sepsis Labs: @LABRCNTIP (procalcitonin:4,lacticidven:4) )No results found for this or any previous visit (from the past 240 hour(s)).   Radiological Exams on Admission: CT Angio Chest Pulmonary Embolism (PE) W or WO Contrast  Result Date: 08/09/2022 CLINICAL DATA:  Pulmonary embolism suspected. Right leg swelling with tenderness to palpation. EXAM: CT ANGIOGRAPHY CHEST WITH CONTRAST TECHNIQUE: Multidetector CT imaging of the chest was performed using the standard protocol during bolus administration of intravenous contrast. Multiplanar CT image reconstructions and MIPs were obtained to evaluate the vascular anatomy. RADIATION DOSE REDUCTION: This exam was performed according to the departmental dose-optimization program which includes automated exposure control, adjustment of the mA and/or kV according to patient size and/or use of iterative reconstruction technique. CONTRAST:  75mL OMNIPAQUE IOHEXOL 350 MG/ML SOLN COMPARISON:  05/17/2020 FINDINGS: Cardiovascular: Satisfactory opacification of the pulmonary arteries to the segmental level. Occlusive filling  defect within segmental branches of the right lower lobe. Asymmetric dilatation of the central left pulmonary artery measuring up to 3.6 cm which could be developmental given the asymmetry. No right heart dilatation. No cardiomegaly or pericardial effusion. Mediastinum/Nodes: No mass or adenopathy. Lungs/Pleura: Some degree of tracheal bronchomalacia with bronchial airway thickening and partial collapse of the trachea. No pulmonary infarct or pulmonary edema. Scarring or atelectasis to a mild degree in the bilateral lungs. Upper Abdomen: Known cirrhosis. No acute finding in the covered abdomen. Musculoskeletal: Spondylitic spurring.  No acute finding. Critical Value/emergent results were called by telephone at the time of interpretation on 08/09/2022 at 5:12 am to provider [facility] , who verbally acknowledged these results. Review of the MIP images confirms the above findings. IMPRESSION: 1. Acute occlusive pulmonary embolism to the right lower lobe affecting segmental branches. 2. 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 Bilateral atelectasis or scarring. 3. 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 4. Cirrhosis. Electronically Signed   By: Tiburcio Pea M.D.   On: 08/09/2022 05:12   DG Tibia/Fibula Right  Result Date: 08/09/2022 CLINICAL DATA:  Pain and swelling EXAM: RIGHT TIBIA AND FIBULA - 2 VIEW; RIGHT FOOT COMPLETE - 3+ VIEW COMPARISON:  Foot radiographs 08/24/2020 FINDINGS: No acute fracture or dislocation. 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 Calcaneal spurs. Soft tissues are radiographically unremarkable. IMPRESSION: No acute fracture or dislocation. Electronically Signed   By: Minerva Fester M.D.   On: 08/09/2022 01:30   DG Foot Complete Right  Result Date: 08/09/2022 CLINICAL DATA:  Pain and swelling EXAM: RIGHT TIBIA AND FIBULA - 2 VIEW; RIGHT FOOT COMPLETE - 3+ VIEW  COMPARISON:  Foot radiographs 08/24/2020 FINDINGS: No acute fracture or dislocation. 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 Calcaneal spurs. Soft tissues are radiographically unremarkable. IMPRESSION: No acute  fracture or dislocation. Electronically Signed   By: Minerva Fester M.D.   On: 08/09/2022 01:30   DG Chest Port 1 View  Result Date: 08/09/2022 CLINICAL DATA:  Chest pain and leg swelling EXAM: PORTABLE CHEST 1 VIEW COMPARISON:  Radiographs 06/25/2022 FINDINGS: Stable cardiomediastinal silhouette given patient rotation. Aortic atherosclerotic calcification. Left basilar atelectasis or infiltrates. No pleural effusion or pneumothorax. No displaced rib fractures. IMPRESSION: Left basilar atelectasis or infiltrates. Electronically Signed   By: Minerva Fester M.D.   On: 08/09/2022 01:27    EKG: Independently reviewed. Sinus rhythm.   Assessment/Plan   1. Acute PE, recurrent  - She is hemodynamically stable, not hypoxic, and cardiac enzymes are normal but she is requiring IV narcotic for pain-control  - She had been off of Xarelto for a dental procedure  - Start IV heparin for now, check RLE venous duplex US, and can likely resume Xarelto at 15 mg BID    Addendum: Pt refuses IV heparin. Discussed with pharmacy, will resume Xarelto at 15 mg BID for 21 days, then 20 mg daily.    2. RLE pain and swelling  - Atraumatic - Check venous duplex US    3. Gastritis  - Continue PPI and Carafate   4. Hepatitis B; cirrhosis  - Continue entecavir, Aldactone   5. CKD 3B  - SCr is 1.58 on admission, up from 1.32 last month  - Renally-dose medications, monitor    6. Depression, anxiety  - Continue Wellbutrin, Sinequan, and Xanax   7. Hypertension  - Continue Coreg    8. COPD  - Not in exacerbation on admission   - Continue ICS-LABA and as-needed albuterol     DVT prophylaxis: IV heparin  Code Status: Full   Level of Care: Level of care:  Telemetry Medical Family Communication: None present  Disposition Plan:  Patient is from: Home Anticipated d/c is to: home  Anticipated d/c date is: 08/11/22  Patient currently: Pending RLE venous duplex US, stable H&H on anticoagulation, pain-control  Consults called: None  Admission status: Inpatient     Briscoe Deutscher, MD [facility]  08/09/2022, 5:45 AM

## 2022-08-09 NOTE — ED Provider Notes (Signed)
[facility] AT [facility] Provider Note   CSN: [id number] Arrival date & time: 08/08/22  2353     History  Chief Complaint  Patient presents with   Leg Pain    [name] is a 67 y.o. female.  Presents to the emergency department with multiple complaints.  She reports that her right leg has become very painful.  She has a history of chronic swelling of her legs, right greater than left.  She was seen by primary care earlier this month and was started on Lasix.  DVT was felt to be unlikely at that time because she was hospitalized in June and had bilateral DVT studies that were negative.  Patient reports that the swelling of the leg is better than it was but she is experiencing pain from the knee down, now unable to bear weight on it.  No injuries.  She also was experiencing chest pain, no significant shortness of breath.       Home Medications Prior to Admission medications   Medication Sig Start Date End Date Taking? Authorizing Provider  albuterol (PROAIR HFA) 108 (90 Base) MCG/ACT inhaler INHALE 2 PUFFS INTO THE LUNGS EVERY 4 HOURS AS NEEDED FOR WHEEZING Patient taking differently: Inhale 2 puffs into the lungs every 4 (four) hours as needed for shortness of breath or wheezing. 12/04/18   Oretha Milch, MD  albuterol (PROVENTIL) (2.5 MG/3ML) 0.083% nebulizer solution Take 3 mLs (2.5 mg total) by nebulization every 4 (four) hours as needed for wheezing or shortness of breath. 12/04/18   Oretha Milch, MD  ALPRAZolam Prudy Feeler) 1 MG tablet Take 1 tablet (1 mg total) by mouth 3 (three) times daily. 06/27/22   Shalhoub, Deno Lunger, MD  baclofen (LIORESAL) 20 MG tablet Take 20 mg by mouth at bedtime. 04/22/20   [provider]  budesonide-formoterol (SYMBICORT) 160-4.5 MCG/ACT inhaler Inhale 2 puffs into the lungs 2 (two) times daily. 12/04/18 06/24/22  Oretha Milch, MD  buPROPion (WELLBUTRIN XL) 150 MG 24 hr tablet Take 150 mg by mouth every  morning.    [provider]  carvedilol (COREG) 12.5 MG tablet Take 1 tablet (12.5 mg total) by mouth 2 (two) times daily with a meal. 06/27/22   Shalhoub, Deno Lunger, MD  Cholecalciferol (VITAMIN D3) 50 MCG (2000 UT) TABS Take 2,000 Units by mouth daily.    [provider]  clotrimazole-betamethasone (LOTRISONE) cream Apply to affected area 2 times daily prn Patient taking differently: Apply 1 application topically 2 (two) times daily as needed (rash). Apply to affected area 2 times daily prn 04/29/20   Cathren Laine, MD  docusate sodium (COLACE) 100 MG capsule Take 100 mg by mouth 2 (two) times daily.    [provider]  doxepin (SINEQUAN) 100 MG capsule Take 100 mg by mouth at bedtime. 06/01/22   [provider]  entecavir (BARACLUDE) 0.5 MG tablet Take 0.5 mg by mouth every other day. 11/25/19   [provider]  famotidine (PEPCID) 20 MG tablet Take 20 mg by mouth 2 (two) times daily. 10/28/19   [provider]  gabapentin (NEURONTIN) 300 MG capsule Take 300 mg by mouth 3 (three) times daily. 07/13/20   [provider]  levocetirizine (XYZAL) 5 MG tablet Take 5 mg by mouth every evening. 12/02/19   [provider]  montelukast (SINGULAIR) 10 MG tablet Take 10 mg by mouth at bedtime.    [provider]  ondansetron (ZOFRAN ODT) 4  MG disintegrating tablet Take 1 tablet (4 mg total) by mouth every 8 (eight) hours as needed for nausea or vomiting. 07/18/20   Sabas Sous, MD  pantoprazole (PROTONIX) 40 MG tablet Take 40 mg by mouth 2 (two) times daily before a meal. 12/23/13   [provider]  rivaroxaban (XARELTO) 20 MG TABS tablet Take 20 mg by mouth daily.    [provider]  spironolactone (ALDACTONE) 25 MG tablet Take 1 tablet (25 mg total) by mouth daily. 06/27/22   Shalhoub, Deno Lunger, MD  sucralfate (CARAFATE) 1 g tablet Take 1 g by mouth 4 (four) times daily. 07/22/19   [provider]  valACYclovir  (VALTREX) 500 MG tablet Take 500 mg by mouth daily as needed (breakout). 08/21/17   [provider]  enoxaparin (LOVENOX) 120 MG/0.8ML SOLN Inject 0.8 mLs (120 mg total) into the skin every 12 (twelve) hours. 11/30/10 04/16/11  Daryel Gerald, MD  loratadine (CLARITIN) 10 MG tablet Take 10 mg by mouth daily.    04/16/11  [provider]      Allergies    Amitriptyline hcl, Azithromycin, Fentanyl, Cymbalta [duloxetine hcl], and Neomycin-bacitracin zn-polymyx    Review of Systems   Review of Systems  Physical Exam Updated Vital Signs BP (!) 140/95   Pulse 84   Temp 98 F (36.7 C) (Oral)   Resp 16   Ht 5\' 4"  (1.626 m)   SpO2 98%   BMI 44.87 kg/m  Physical Exam Vitals and nursing note reviewed.  Constitutional:      General: She is not in acute distress.    Appearance: She is well-developed.  HENT:     Head: Normocephalic and atraumatic.     Mouth/Throat:     Mouth: Mucous membranes are moist.  Eyes:     General: Vision grossly intact. Gaze aligned appropriately.     Extraocular Movements: Extraocular movements intact.     Conjunctiva/sclera: Conjunctivae normal.  Cardiovascular:     Rate and Rhythm: Normal rate and regular rhythm.     Pulses:          Dorsalis pedis pulses are 1+ on the right side and 1+ on the left side.     Heart sounds: Normal heart sounds, S1 normal and S2 normal. No murmur heard.    No friction rub. No gallop.  Pulmonary:     Effort: Pulmonary effort is normal. No respiratory distress.     Breath sounds: Normal breath sounds.  Abdominal:     General: Bowel sounds are normal.     Palpations: Abdomen is soft.     Tenderness: There is no abdominal tenderness. There is no guarding or rebound.     Hernia: No hernia is present.  Musculoskeletal:        General: No swelling.     Cervical back: Full passive range of motion without pain, normal range of motion and neck supple. No spinous process tenderness or muscular  tenderness. Normal range of motion.     Right lower leg: Edema present.     Left lower leg: Edema present.     Comments: Right lower extremity edema greater than left  Skin:    General: Skin is warm and dry.     Capillary Refill: Capillary refill takes less than 2 seconds.     Findings: No ecchymosis, erythema, rash or wound.  Neurological:     General: No focal deficit present.     Mental Status: She is alert and  oriented to person, place, and time.     GCS: GCS eye subscore is 4. GCS verbal subscore is 5. GCS motor subscore is 6.     Cranial Nerves: Cranial nerves 2-12 are intact.     Sensory: Sensation is intact.     Motor: Motor function is intact.     Coordination: Coordination is intact.  Psychiatric:        Attention and Perception: Attention normal.        Mood and Affect: Mood normal.        Speech: Speech normal.        Behavior: Behavior normal.     ED Results / Procedures / Treatments   Labs (all labs ordered are listed, but only abnormal results are displayed) Labs Reviewed  CBC WITH DIFFERENTIAL/PLATELET - Abnormal; Notable for the following components:      Result Value   Hemoglobin 10.9 (*)    MCHC 29.4 (*)    RDW 22.5 (*)    Platelets 143 (*)    All other components within normal limits  COMPREHENSIVE METABOLIC PANEL - Abnormal; Notable for the following components:   Glucose, Bld 113 (*)    Creatinine, Ser 1.58 (*)    Total Protein 6.3 (*)    Albumin 3.2 (*)    GFR, Estimated 36 (*)    All other components within normal limits  D-DIMER, QUANTITATIVE - Abnormal; Notable for the following components:   D-Dimer, Quant 1.90 (*)    All other components within normal limits  BRAIN NATRIURETIC PEPTIDE  CK  HEPARIN LEVEL (UNFRACTIONATED)  TROPONIN I (HIGH SENSITIVITY)  TROPONIN I (HIGH SENSITIVITY)    EKG EKG Interpretation Date/Time:  Wednesday August 09 2022 00:03:25 EDT Ventricular Rate:  78 PR Interval:  168 QRS Duration:  97 QT  Interval:  367 QTC Calculation: 418 R Axis:   -29  Text Interpretation: Sinus rhythm Borderline left axis deviation Abnormal R-wave progression, early transition Nonspecific T abnormalities, lateral leads Confirmed by Gilda Crease [phone]) on 08/09/2022 12:11:14 AM  Radiology CT Angio Chest Pulmonary Embolism (PE) W or WO Contrast  Result Date: 08/09/2022 CLINICAL DATA:  Pulmonary embolism suspected. Right leg swelling with tenderness to palpation. EXAM: CT ANGIOGRAPHY CHEST WITH CONTRAST TECHNIQUE: Multidetector CT imaging of the chest was performed using the standard protocol during bolus administration of intravenous contrast. Multiplanar CT image reconstructions and MIPs were obtained to evaluate the vascular anatomy. RADIATION DOSE REDUCTION: This exam was performed according to the departmental dose-optimization program which includes automated exposure control, adjustment of the mA and/or kV according to patient size and/or use of iterative reconstruction technique. CONTRAST:  75mL OMNIPAQUE IOHEXOL 350 MG/ML SOLN COMPARISON:  05/17/2020 FINDINGS: Cardiovascular: Satisfactory opacification of the pulmonary arteries to the segmental level. Occlusive filling defect within segmental branches of the right lower lobe. Asymmetric dilatation of the central left pulmonary artery measuring up to 3.6 cm which could be developmental given the asymmetry. No right heart dilatation. No cardiomegaly or pericardial effusion. Mediastinum/Nodes: No mass or adenopathy. Lungs/Pleura: Some degree of tracheal bronchomalacia with bronchial airway thickening and partial collapse of the trachea. No pulmonary infarct or pulmonary edema. Scarring or atelectasis to a mild degree in the bilateral lungs. Upper Abdomen: Known cirrhosis. No acute finding in the covered abdomen. Musculoskeletal: Spondylitic spurring.  No acute finding. Critical Value/emergent results were called by telephone at the time of interpretation on  08/09/2022 at 5:12 am to provider [facility] , who verbally acknowledged these results. Review of  the MIP images confirms the above findings. IMPRESSION: 1. Acute occlusive pulmonary embolism to the right lower lobe affecting segmental branches. 2. 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 Bilateral atelectasis or scarring. 3. 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 4. Cirrhosis. Electronically Signed   By: Tiburcio Pea M.D.   On: 08/09/2022 05:12   DG Tibia/Fibula Right  Result Date: 08/09/2022 CLINICAL DATA:  Pain and swelling EXAM: RIGHT TIBIA AND FIBULA - 2 VIEW; RIGHT FOOT COMPLETE - 3+ VIEW COMPARISON:  Foot radiographs 08/24/2020 FINDINGS: No acute fracture or dislocation. 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 Calcaneal spurs. Soft tissues are radiographically unremarkable. IMPRESSION: No acute fracture or dislocation. Electronically Signed   By: Minerva Fester M.D.   On: 08/09/2022 01:30   DG Foot Complete Right  Result Date: 08/09/2022 CLINICAL DATA:  Pain and swelling EXAM: RIGHT TIBIA AND FIBULA - 2 VIEW; RIGHT FOOT COMPLETE - 3+ VIEW COMPARISON:  Foot radiographs 08/24/2020 FINDINGS: No acute fracture or dislocation. 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 Calcaneal spurs. Soft tissues are radiographically unremarkable. IMPRESSION: No acute fracture or dislocation. Electronically Signed   By: Minerva Fester M.D.   On: 08/09/2022 01:30   DG Chest Port 1 View  Result Date: 08/09/2022 CLINICAL DATA:  Chest pain and leg swelling EXAM: PORTABLE CHEST 1 VIEW COMPARISON:  Radiographs 06/25/2022 FINDINGS: Stable cardiomediastinal silhouette given patient rotation. Aortic atherosclerotic calcification. Left basilar atelectasis or infiltrates. No pleural effusion or pneumothorax. No displaced rib fractures. IMPRESSION: Left basilar  atelectasis or infiltrates. Electronically Signed   By: Minerva Fester M.D.   On: 08/09/2022 01:27    Procedures Procedures    Medications Ordered in ED Medications  entecavir (BARACLUDE) tablet 0.5 mg (has no administration in time range)  carvedilol (COREG) tablet 12.5 mg (has no administration in time range)  ALPRAZolam (XANAX) tablet 1 mg (has no administration in time range)  buPROPion (WELLBUTRIN XL) 24 hr tablet 150 mg (has no administration in time range)  doxepin (SINEQUAN) capsule 100 mg (has no administration in time range)  pantoprazole (PROTONIX) EC tablet 40 mg (has no administration in time range)  sucralfate (CARAFATE) tablet 1 g (has no administration in time range)  baclofen (LIORESAL) tablet 20 mg (has no administration in time range)  gabapentin (NEURONTIN) capsule 300 mg (has no administration in time range)  albuterol (PROVENTIL) (2.5 MG/3ML) 0.083% nebulizer solution 2.5 mg (has no administration in time range)  mometasone-formoterol (DULERA) 200-5 MCG/ACT inhaler 2 puff (has no administration in time range)  montelukast (SINGULAIR) tablet 10 mg (has no administration in time range)  sodium chloride flush (NS) 0.9 % injection 3 mL (has no administration in time range)  acetaminophen (TYLENOL) tablet 650 mg (has no administration in time range)    Or  acetaminophen (TYLENOL) suppository 650 mg (has no administration in time range)  oxyCODONE (Oxy IR/ROXICODONE) immediate release tablet 5 mg (has no administration in time range)  HYDROmorphone (DILAUDID) injection 0.5-1 mg (has no administration in time range)  docusate sodium (COLACE) capsule 100 mg (has no administration in time range)  polyethylene glycol (MIRALAX / GLYCOLAX) packet 17 g (has no administration in time range)  ondansetron (ZOFRAN) tablet 4 mg (has no administration in time range)    Or  ondansetron (ZOFRAN) injection 4 mg (has no administration in time range)  heparin bolus via infusion 4,000  Units (has no administration in time range)  heparin ADULT  infusion 100 units/mL (25000 units/249mL) (has no administration in time range)  ondansetron (ZOFRAN) injection 4 mg (4 mg Intravenous Given 08/09/22 0124)  morphine (PF) 4 MG/ML injection 4 mg (4 mg Intravenous Given 08/09/22 0124)  morphine (PF) 4 MG/ML injection 4 mg (4 mg Intravenous Given 08/09/22 0348)  alum & mag hydroxide-simeth (MAALOX/MYLANTA) 200-200-20 MG/5ML suspension 30 mL (30 mLs Oral Given 08/09/22 0347)  iohexol (OMNIPAQUE) 350 MG/ML injection 75 mL (75 mLs Intravenous Contrast Given 08/09/22 0448)    ED Course/ Medical Decision Making/ A&P                             Medical Decision Making Amount and/or Complexity of Data Reviewed Labs: ordered. Radiology: ordered.  Risk OTC drugs. Prescription drug management.   Differential Diagnosis considered includes, but not limited to: STEMI; NSTEMI; myocarditis; pericarditis; pulmonary embolism; aortic dissection; pneumothorax; pneumonia; gastritis; musculoskeletal pain  Patient presents to the emergency department for evaluation of right-sided chest pain and right leg pain.  She does have a history of chronic swelling of both of her legs and reports that the right leg is generally more swollen than the left.  Over the last couple of days, however, the leg has become very painful and she is having trouble walking on it.  She recently saw her primary care doctor for this and was put on Lasix for her swelling.  At that time it was felt that it was unlikely to be a DVT because the patient is anticoagulated and did have bilateral DVT studies in the beginning of June that were negative.  Patient does not have significant edema currently and she does have increased circumference of the right calf with tenderness.  Additionally, she is now experiencing right-sided chest pain.  PE and DVT were once again considered.  Patient underwent CT scan that does show subsegmental PE.  I  suspect there will be a DVT in the leg when vascular lab is available later today.  Although patient is on Xarelto, she has been off of it twice in the last 6 weeks.  She was admitted to the hospital on June 1 for upper GI bleed which was found to be secondary to gastritis.  She was restarted on her Xarelto at discharge.  She has been on single dose per day of 20 mg.  She also stopped her Xarelto again 1 week ago for a dental procedure.  She reports that she did not undergo the dental procedure and did restart her Xarelto yesterday.  She takes it in the evening.  I suspect that this is not a Xarelto failure as much as it is secondary to the patient being on and off her Xarelto.  Once a day dosing might not be the best option at this time.  Discussed with Dr. Antionette Char, hospitalist service.  We will heparinize the patient and then determine what the best anticoagulation pathway is for her.   CRITICAL CARE Performed by: Gilda Crease   Total critical care time: 30 minutes  Critical care time was exclusive of separately billable procedures and treating other patients.  Critical care was necessary to treat or prevent imminent or life-threatening deterioration.  Critical care was time spent personally by me on the following activities: development of treatment plan with patient and/or surrogate as well as nursing, discussions with consultants, evaluation of patient's response to treatment, examination of patient, obtaining history from patient or surrogate, ordering and performing treatments  and interventions, ordering and review of laboratory studies, ordering and review of radiographic studies, pulse oximetry and re-evaluation of patient's condition.        Final Clinical Impression(s) / ED Diagnoses Final diagnoses:  PE (pulmonary thromboembolism) (HCC)    Rx / DC Orders ED Discharge Orders     None         Caryl Fate, Canary Brim, MD 08/09/22 [phone]

## 2022-08-09 NOTE — Progress Notes (Signed)
Right lower extremity venous study completed.   Preliminary results relayed to MD and RN in ER.  Please see CV Procedures for preliminary results.  Monita Swier, RVT  9:27 AM 08/09/22

## 2022-08-09 NOTE — Discharge Summary (Signed)
Physician Discharge Summary   Patient: [name] MRN: [id number] DOB: August 21, 1955  Admit date:     08/08/2022  Discharge date: 08/09/22  Discharge Physician: Tyrone Nine   PCP: Tracey Harries, MD   Recommendations at discharge:  Follow up with PCP in 1-2 weeks. Reload xarelto for recurrent PE.  Discharge Diagnoses: Principal Problem:   Recurrent pulmonary embolism (HCC) Active Problems:   Essential hypertension   Chronic hepatitis B with cirrhosis (HCC)   Chronic kidney disease, stage 3b (HCC)   COPD (chronic obstructive pulmonary disease) (HCC)   Depressive disorder   Pain and swelling of right lower leg  Hospital Course: HPI: [name] is a 67 y.o. 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   Patient reports that she was experiencing leg swelling roughly a month ago and was started on Lasix with good effect initially.  She then stopped Xarelto for 5 days in preparation for a tooth extraction that was to occur yesterday, but by this time the right leg had began to swell again and was becoming progressively more painful.  She was unable to walk yesterday due to the pain and had to cancel the tooth extraction.  She then went on to develop chest discomfort and lightheadedness upon standing yesterday and called EMS.   ED Course: Upon arrival to the ED, patient is found to be afebrile and saturating well on room air with normal heart rate and elevated blood pressure.  Plain radiographs of the right foot and lower leg are negative for acute findings.  CTA chest is notable for acute occlusive PE in the right lower lobe affecting segmental branches and without right heart dilation.  Labs are most notable for creatinine 1.58, hemoglobin 10.9, platelets 143,000, normal  troponin x 2, and normal BNP.   Patient was given 2 doses of morphine in the ED. A subsequent IV dilaudid dose and suffered lethargy due to that. Her mentation has improved/normalized. She has no shortness of breath, VSS.   Assessment and Plan: 1. Acute PE, recurrent  - She is hemodynamically stable, not hypoxic, and cardiac enzymes are normal. No RV dilatation on CT.  - She had been off of Xarelto for a dental procedure  - Restarted this at loading dose, prescribed. No RLE DVT on U/S.   - For acute pain, after PDMP review, prescribed hydrocodone as below with extreme caution advised to pt and daughter not to take with benzodiazepine     Consultants: None Procedures performed: None  Disposition: Home Diet recommendation:  Cardiac and Carb modified diet DISCHARGE MEDICATION: Allergies as of 08/09/2022       Reactions   Heparin Hives   Pt states she cannot take because she will break out in hives   Amitriptyline Hcl Other (See Comments)   Causes her to be very "disoriented".   Azithromycin Rash   Fentanyl Other (See Comments)   Sees things Pt stated had hallucinations "d/t taking high dosage, but can tolerate low dosage"   Cymbalta [duloxetine Hcl] Other (See Comments)   Headaches   Neomycin-bacitracin Zn-polymyx Rash        Medication List     STOP taking these medications    rivaroxaban 20 MG Tabs tablet Commonly known as: XARELTO Replaced by: Rivaroxaban Starter Pack (15 mg and 20 mg)  TAKE these medications    albuterol 108 (90 Base) MCG/ACT inhaler Commonly known as: ProAir HFA INHALE 2 PUFFS INTO THE LUNGS EVERY 4 HOURS AS NEEDED FOR WHEEZING What changed:  how much to take how to take this when to take this reasons to take this additional instructions   albuterol (2.5 MG/3ML) 0.083% nebulizer solution Commonly known as: PROVENTIL Take 3 mLs (2.5 mg total) by nebulization every 4 (four) hours as needed for wheezing or shortness of breath. What  changed: Another medication with the same name was changed. Make sure you understand how and when to take each.   ALPRAZolam 1 MG tablet Commonly known as: XANAX Take 1 tablet (1 mg total) by mouth 3 (three) times daily.   baclofen 20 MG tablet Commonly known as: LIORESAL Take 20 mg by mouth at bedtime.   budesonide-formoterol 160-4.5 MCG/ACT inhaler Commonly known as: SYMBICORT Inhale 2 puffs into the lungs 2 (two) times daily.   buPROPion 150 MG 24 hr tablet Commonly known as: WELLBUTRIN XL Take 150 mg by mouth every morning.   carvedilol 12.5 MG tablet Commonly known as: COREG Take 1 tablet (12.5 mg total) by mouth 2 (two) times daily with a meal.   clotrimazole-betamethasone cream Commonly known as: LOTRISONE Apply to affected area 2 times daily prn What changed:  how much to take how to take this when to take this reasons to take this   docusate sodium 100 MG capsule Commonly known as: COLACE Take 100 mg by mouth 2 (two) times daily.   doxepin 100 MG capsule Commonly known as: SINEQUAN Take 100 mg by mouth at bedtime.   entecavir 0.5 MG tablet Commonly known as: BARACLUDE Take 0.5 mg by mouth every other day.   famotidine 20 MG tablet Commonly known as: PEPCID Take 20 mg by mouth 2 (two) times daily.   gabapentin 300 MG capsule Commonly known as: NEURONTIN Take 300 mg by mouth 3 (three) times daily.   HYDROcodone-acetaminophen 5-325 MG tablet Commonly known as: NORCO/VICODIN Take 1 tablet by mouth every 8 (eight) hours for 5 days.   levocetirizine 5 MG tablet Commonly known as: XYZAL Take 5 mg by mouth every evening.   montelukast 10 MG tablet Commonly known as: SINGULAIR Take 10 mg by mouth at bedtime.   ondansetron 4 MG disintegrating tablet Commonly known as: Zofran ODT Take 1 tablet (4 mg total) by mouth every 8 (eight) hours as needed for nausea or vomiting.   pantoprazole 40 MG tablet Commonly known as: PROTONIX Take 40 mg by mouth 2  (two) times daily before a meal.   Rivaroxaban Starter Pack (15 mg and 20 mg) Commonly known as: XARELTO STARTER PACK Follow package directions: Take one 15mg  tablet by mouth twice a day. On day 22, switch to one 20mg  tablet once a day. Take with food. Replaces: rivaroxaban 20 MG Tabs tablet   spironolactone 25 MG tablet Commonly known as: ALDACTONE Take 1 tablet (25 mg total) by mouth daily.   sucralfate 1 g tablet Commonly known as: CARAFATE Take 1 g by mouth 4 (four) times daily.   valACYclovir 500 MG tablet Commonly known as: VALTREX Take 500 mg by mouth daily as needed (breakout).   Vitamin D3 50 MCG (2000 UT) Tabs Take 2,000 Units by mouth daily.        Follow-up Information     Tracey Harries, MD. Schedule an appointment as soon as possible for a visit in 1 week(s).   Specialty: Family Medicine Contact  information: [address] [place] [address] [phone]                Discharge Exam: BP (!) 149/90   Pulse 80   Temp 97.9 F (36.6 C) (Oral)   Resp 15   Ht 5\' 4"  (1.626 m)   SpO2 99%   BMI 44.87 kg/m   No distress, talkative, at her mental baseline per daughter at bedside who confirms she will be managing her medications.  Clear, nonlabored, speaking in very full sentences RRR, no MRG. Trace R > L LE edema.   Condition at discharge: stable  The results of significant diagnostics from this hospitalization (including imaging, microbiology, ancillary and laboratory) are listed below for reference.   Imaging Studies: VAS [place] LOWER EXTREMITY VENOUS (DVT)  Result Date: 08/09/2022  Lower Venous DVT Study Patient Name:  KYLEIGHA MARKERT  Date of Exam:   08/09/2022 Medical Rec #: [id number]     Accession #:    [id number] Date of Birth: [birth year]    Patient Gender: F Patient Age:   33 years Exam Location:  [facility] Procedure:      VAS [place] LOWER EXTREMITY VENOUS (DVT) Referring Phys: TIMOTHY OPYD  --------------------------------------------------------------------------------  Indications: RLE swelling, recently stopped xarelto, PE.  Comparison Study: Previous 06/25/22 negative. Performing Technologist: McKayla Maag RVT, [place]  Examination Guidelines: A complete evaluation includes B-mode imaging, spectral Doppler, color Doppler, and power Doppler as needed of all accessible portions of each vessel. Bilateral testing is considered an integral part of a complete examination. Limited examinations for reoccurring indications may be performed as noted. The reflux portion of the exam is performed with the patient in reverse Trendelenburg.  +---------+---------------+---------+-----------+----------+--------------+ RIGHT    CompressibilityPhasicitySpontaneityPropertiesThrombus Aging +---------+---------------+---------+-----------+----------+--------------+ CFV      Full           Yes      Yes                                 +---------+---------------+---------+-----------+----------+--------------+ SFJ      Full                                                        +---------+---------------+---------+-----------+----------+--------------+ FV Prox  Full                                                        +---------+---------------+---------+-----------+----------+--------------+ FV Mid   Full                                                        +---------+---------------+---------+-----------+----------+--------------+ FV DistalFull                                                        +---------+---------------+---------+-----------+----------+--------------+  PFV      Full                                                        +---------+---------------+---------+-----------+----------+--------------+ POP      Full           Yes      Yes                                 +---------+---------------+---------+-----------+----------+--------------+ PTV       Full                                                        +---------+---------------+---------+-----------+----------+--------------+ PERO     Full                                                        +---------+---------------+---------+-----------+----------+--------------+   +----+---------------+---------+-----------+----------+--------------+ LEFTCompressibilityPhasicitySpontaneityPropertiesThrombus Aging +----+---------------+---------+-----------+----------+--------------+ CFV Full           Yes      Yes                                 +----+---------------+---------+-----------+----------+--------------+ SFJ Full                                                        +----+---------------+---------+-----------+----------+--------------+     Summary: RIGHT: - There is no evidence of deep vein thrombosis in the lower extremity.  - No cystic structure found in the popliteal fossa.  LEFT: - No evidence of common femoral vein obstruction.  *See table(s) above for measurements and observations.    Preliminary    CT Angio Chest Pulmonary Embolism (PE) W or WO Contrast  Result Date: 08/09/2022 CLINICAL DATA:  Pulmonary embolism suspected. Right leg swelling with tenderness to palpation. EXAM: CT ANGIOGRAPHY CHEST WITH CONTRAST TECHNIQUE: Multidetector CT imaging of the chest was performed using the standard protocol during bolus administration of intravenous contrast. Multiplanar CT image reconstructions and MIPs were obtained to evaluate the vascular anatomy. RADIATION DOSE REDUCTION: This exam was performed according to the departmental dose-optimization program which includes automated exposure control, adjustment of the mA and/or kV according to patient size and/or use of iterative reconstruction technique. CONTRAST:  75mL OMNIPAQUE IOHEXOL 350 MG/ML SOLN COMPARISON:  05/17/2020 FINDINGS: Cardiovascular: Satisfactory opacification of the pulmonary arteries to the  segmental level. Occlusive filling defect within segmental branches of the right lower lobe. Asymmetric dilatation of the central left pulmonary artery measuring up to 3.6 cm which could be developmental given the asymmetry. No right heart dilatation. No cardiomegaly or pericardial effusion. Mediastinum/Nodes: No mass or adenopathy. Lungs/Pleura: Some degree of tracheal bronchomalacia with bronchial airway thickening and partial collapse of the trachea.  No pulmonary infarct or pulmonary edema. Scarring or atelectasis to a mild degree in the bilateral lungs. Upper Abdomen: Known cirrhosis. No acute finding in the covered abdomen. Musculoskeletal: Spondylitic spurring.  No acute finding. Critical Value/emergent results were called by telephone at the time of interpretation on 08/09/2022 at 5:12 am to provider [facility] , who verbally acknowledged these results. Review of the MIP images confirms the above findings. IMPRESSION: 1. Acute occlusive pulmonary embolism to the right lower lobe affecting segmental branches. 2. 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 Bilateral atelectasis or scarring. 3. 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 4. Cirrhosis. Electronically Signed   By: Tiburcio Pea M.D.   On: 08/09/2022 05:12   DG Tibia/Fibula Right  Result Date: 08/09/2022 CLINICAL DATA:  Pain and swelling EXAM: RIGHT TIBIA AND FIBULA - 2 VIEW; RIGHT FOOT COMPLETE - 3+ VIEW COMPARISON:  Foot radiographs 08/24/2020 FINDINGS: No acute fracture or dislocation. 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 Calcaneal spurs. Soft tissues are radiographically unremarkable. IMPRESSION: No acute fracture or dislocation. Electronically Signed   By: Minerva Fester M.D.   On: 08/09/2022 01:30   DG Foot Complete Right  Result Date: 08/09/2022 CLINICAL DATA:  Pain and swelling EXAM: RIGHT TIBIA AND FIBULA - 2 VIEW;  RIGHT FOOT COMPLETE - 3+ VIEW COMPARISON:  Foot radiographs 08/24/2020 FINDINGS: No acute fracture or dislocation. 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 Calcaneal spurs. Soft tissues are radiographically unremarkable. IMPRESSION: No acute fracture or dislocation. Electronically Signed   By: Minerva Fester M.D.   On: 08/09/2022 01:30   DG Chest Port 1 View  Result Date: 08/09/2022 CLINICAL DATA:  Chest pain and leg swelling EXAM: PORTABLE CHEST 1 VIEW COMPARISON:  Radiographs 06/25/2022 FINDINGS: Stable cardiomediastinal silhouette given patient rotation. Aortic atherosclerotic calcification. Left basilar atelectasis or infiltrates. No pleural effusion or pneumothorax. No displaced rib fractures. IMPRESSION: Left basilar atelectasis or infiltrates. Electronically Signed   By: Minerva Fester M.D.   On: 08/09/2022 01:27    Microbiology: Results for orders placed or performed during the hospital encounter of 06/24/22  MRSA Next Gen by PCR, Nasal     Status: None   Collection Time: 06/24/22  7:17 PM   Specimen: Nasal Mucosa; Nasal Swab  Result Value Ref Range Status   MRSA by PCR Next Gen NOT DETECTED NOT DETECTED Final    Comment: (NOTE) The GeneXpert MRSA Assay (FDA approved for NASAL specimens only), is one component of a comprehensive MRSA colonization surveillance program. It is not intended to diagnose MRSA infection nor to guide or monitor treatment for MRSA infections. Test performance is not FDA approved in patients less than 33 years old. Performed at [facility], [address], [place], [place] [address]   Group A Strep by PCR     Status: None   Collection Time: 06/25/22  3:48 PM   Specimen: Throat; Sterile Swab  Result Value Ref Range Status   Group A Strep by PCR NOT DETECTED NOT DETECTED Final    Comment: Performed at [facility], [address], [place], [place] [address]     Labs: CBC: Recent Labs  Lab 08/09/22 0019 08/09/22 1013  WBC 5.0  --   NEUTROABS 3.1  --   HGB 10.9* 12.6  HCT 37.1 37.0  MCV 89.6  --   PLT 143*  --    Basic Metabolic Panel: Recent Labs  Lab  08/09/22 0019 08/09/22 1013  NA 138 139  K 4.0 4.1  CL 102  --   CO2 27  --   GLUCOSE 113*  --   BUN 16  --   CREATININE 1.58*  --   CALCIUM 9.2  --    Liver Function Tests: Recent Labs  Lab 08/09/22 0019  AST 26  ALT 33  ALKPHOS 102  BILITOT 0.3  PROT 6.3*  ALBUMIN 3.2*   CBG: Recent Labs  Lab 08/09/22 0940  GLUCAP 106*    Discharge time spent: greater than 30 minutes.  Signed: Tyrone Nine, MD [facility] 08/09/2022

## 2022-08-09 NOTE — ED Notes (Signed)
Walked patient to the bathroom patient did well patient is now back in bed on the monitor with family at bedside and call bell in reach  ?

## 2022-08-09 NOTE — Progress Notes (Signed)
ANTICOAGULATION CONSULT NOTE - Initial Consult  Pharmacy Consult for Heparin  Indication: pulmonary embolus, also possible DVT (has history of both-recently stopped Xarelto)  Allergies  Allergen Reactions   Amitriptyline Hcl Other (See Comments)     Causes her to be very "disoriented".   Azithromycin Rash   Fentanyl Other (See Comments)    Sees things Pt stated had hallucinations "d/t taking high dosage, but can tolerate low dosage"    Cymbalta [Duloxetine Hcl] Other (See Comments)    Headaches    Neomycin-Bacitracin Zn-Polymyx Rash    Patient Measurements: Height: 5\' 4"  (162.6 cm) IBW/kg (Calculated) : 54.7  Vital Signs: Temp: 98 F (36.7 C) (07/17 0349) Temp Source: Oral (07/17 0349) BP: 140/95 (07/17 0415) Pulse Rate: 84 (07/17 0415)  Labs: Recent Labs    08/09/22 0019 08/09/22 0337  HGB 10.9*  --   HCT 37.1  --   PLT 143*  --   CREATININE 1.58*  --   CKTOTAL 180  --   TROPONINIHS 10 10    CrCl cannot be calculated (Unknown ideal weight.).   Medical History: Past Medical History:  Diagnosis Date   Agoraphobia with panic attacks    Altered mental state 10/26/2013   Ankle fracture, left 11/27/2010   S/p ORIF 11/2    Anxiety    Arthritis    "knees; lower back" (10/30/2013)   Bipolar disorder (HCC)    Cavitary pneumonia    Cirrhosis (HCC)    CKD (chronic kidney disease), stage III (HCC)    COPD (chronic obstructive pulmonary disease) (HCC)    Depression    DVT (deep venous thrombosis) (HCC) 09/2013   RLE   GERD (gastroesophageal reflux disease)    Heart murmur    Hepatitis B    History of blood transfusion    "due to excessive blood loss before hysterectomy"   History of urinary tract infection    Hypertension    currently on no medication    Kidney stones    Pneumonia    "4 times in the past year" (10/30/2013)   Pulmonary emboli (HCC) 09/2013   Shortness of breath dyspnea    increased exertion;exercise   Urinary frequency    Urinary  incontinence       Assessment: 67 y/o F with swollen right leg, history of DVT and PE but recently stopped Xarelto about one week ago, CT Angio reveals acute PE, starting heparin.   Recently admitted last month with concern for GI bleeding, but nothing found on endoscopy per GI, Hgb now up to 10.9  Goal of Therapy:  Heparin level 0.3-0.7 units/ml, will aim for normal goal given GI found no evidence GIB Monitor platelets by anticoagulation protocol: Yes   Plan:  Heparin 4000 units BOLUS Start heparin drip at 1500 units/hr 1400 Heparin level Daily CBC/Heparin level Monitor for bleeding  Abran Duke, PharmD, BCPS Clinical Pharmacist Phone: [phone]

## 2022-08-09 NOTE — ED Triage Notes (Signed)
Pt BIB EMS from c/o Right leg swollen, tender on palpation. Pt states leg hurts and is heavy with sharp burning pain. hx of DVT's/PE's on xarelto, was told by her primary care to stop blood thinners x 5 days ago, last 3 days now having pain to right lower calf down to foot.

## 2022-08-10 ENCOUNTER — Emergency Department (HOSPITAL_COMMUNITY): Payer: 59

## 2022-08-10 ENCOUNTER — Other Ambulatory Visit: Payer: Self-pay

## 2022-08-10 ENCOUNTER — Emergency Department (HOSPITAL_COMMUNITY)
Admission: EM | Admit: 2022-08-10 | Discharge: 2022-08-10 | Disposition: A | Discharge status: Home / Self Care | Payer: 59 | Attending: Emergency Medicine | Admitting: Emergency Medicine

## 2022-08-10 ENCOUNTER — Encounter (HOSPITAL_COMMUNITY): Payer: Self-pay | Admitting: Emergency Medicine

## 2022-08-10 DIAGNOSIS — M79604 Pain in right leg: Secondary | ICD-10-CM | POA: Insufficient documentation

## 2022-08-10 DIAGNOSIS — R4781 Slurred speech: Secondary | ICD-10-CM | POA: Insufficient documentation

## 2022-08-10 DIAGNOSIS — Z7901 Long term (current) use of anticoagulants: Secondary | ICD-10-CM | POA: Diagnosis not present

## 2022-08-10 DIAGNOSIS — R400 Somnolence: Secondary | ICD-10-CM | POA: Diagnosis present

## 2022-08-10 DIAGNOSIS — F1110 Opioid abuse, uncomplicated: Secondary | ICD-10-CM | POA: Insufficient documentation

## 2022-08-10 MED ORDER — RIVAROXABAN 15 MG PO TABS: 15.0000 mg | ORAL_TABLET | Freq: Every day | ORAL | 0 refills | Status: DC

## 2022-08-10 NOTE — ED Notes (Signed)
Patient transported to CT 

## 2022-08-10 NOTE — ED Triage Notes (Signed)
Pt from home, c/o right leg pain, was discharged yesterday for the same.  "I told them I didn't want to go home b/c I hurt."  Pt's was discharged w/ pain medication and her pupils are 2 and her speech is slurred.  Pt requested pain medication several times w/ EMS.  Pt is now asleep.  146/95 P 76 94% on room air

## 2022-08-10 NOTE — ED Notes (Signed)
Daughter, Whitney Post, contacted and updated to results and discharge plan. Daughter and patient verbalize understanding. Patient discharged at this time. Escorted to lobby in wheelchair to wait for daughter's arrival.

## 2022-08-10 NOTE — Discharge Instructions (Addendum)
Your CT head did not show and some new bleeding causing your symptoms.  The previous team were concerned about your mental status with the pain medicine.  Please be careful taking her pain medicine and not overdo it.  Please follow-up with your primary doctor.  As you spilled coffee and dissolved this evening's Xarelto dose, I printed a prescription for you to get a single tablet refilled.  Please take this tonight.  Please be careful taking her medication safely and follow-up with your regular doctor.

## 2022-08-10 NOTE — ED Provider Notes (Signed)
[place] EMERGENCY DEPARTMENT AT [facility] Provider Note   CSN: [id number] Arrival date & time: 08/10/22  0453     History  Chief Complaint  Patient presents with   Leg Pain    [name] is a 67 y.o. female.  Patient presents to the department for evaluation of leg pain.  This is per EMS, patient is somnolent at arrival.  Patient was admitted to the hospital yesterday for pulmonary embolism and discharged.       Home Medications Prior to Admission medications   Medication Sig Start Date End Date Taking? Authorizing Provider  albuterol (PROAIR HFA) 108 (90 Base) MCG/ACT inhaler INHALE 2 PUFFS INTO THE LUNGS EVERY 4 HOURS AS NEEDED FOR WHEEZING Patient taking differently: Inhale 2 puffs into the lungs every 4 (four) hours as needed for shortness of breath or wheezing. 12/04/18   Oretha Milch, MD  albuterol (PROVENTIL) (2.5 MG/3ML) 0.083% nebulizer solution Take 3 mLs (2.5 mg total) by nebulization every 4 (four) hours as needed for wheezing or shortness of breath. 12/04/18   Oretha Milch, MD  ALPRAZolam Prudy Feeler) 1 MG tablet Take 1 tablet (1 mg total) by mouth 3 (three) times daily. 06/27/22   Shalhoub, Deno Lunger, MD  baclofen (LIORESAL) 20 MG tablet Take 20 mg by mouth at bedtime. 04/22/20   [provider]  budesonide-formoterol (SYMBICORT) 160-4.5 MCG/ACT inhaler Inhale 2 puffs into the lungs 2 (two) times daily. 12/04/18 06/24/22  Oretha Milch, MD  buPROPion (WELLBUTRIN XL) 150 MG 24 hr tablet Take 150 mg by mouth every morning.    [provider]  carvedilol (COREG) 12.5 MG tablet Take 1 tablet (12.5 mg total) by mouth 2 (two) times daily with a meal. 06/27/22   Shalhoub, Deno Lunger, MD  Cholecalciferol (VITAMIN D3) 50 MCG (2000 UT) TABS Take 2,000 Units by mouth daily.    [provider]  clotrimazole-betamethasone (LOTRISONE) cream Apply to affected area 2 times daily prn Patient taking differently: Apply 1 application topically 2 (two)  times daily as needed (rash). Apply to affected area 2 times daily prn 04/29/20   Cathren Laine, MD  docusate sodium (COLACE) 100 MG capsule Take 100 mg by mouth 2 (two) times daily.    [provider]  doxepin (SINEQUAN) 100 MG capsule Take 100 mg by mouth at bedtime. 06/01/22   [provider]  entecavir (BARACLUDE) 0.5 MG tablet Take 0.5 mg by mouth every other day. 11/25/19   [provider]  famotidine (PEPCID) 20 MG tablet Take 20 mg by mouth 2 (two) times daily. 10/28/19   [provider]  gabapentin (NEURONTIN) 300 MG capsule Take 300 mg by mouth 3 (three) times daily. 07/13/20   [provider]  HYDROcodone-acetaminophen (NORCO/VICODIN) 5-325 MG tablet Take 1 tablet by mouth every 8 (eight) hours for 5 days. 08/09/22 08/14/22  Tyrone Nine, MD  levocetirizine (XYZAL) 5 MG tablet Take 5 mg by mouth every evening. 12/02/19   [provider]  montelukast (SINGULAIR) 10 MG tablet Take 10 mg by mouth at bedtime.    [provider]  ondansetron (ZOFRAN ODT) 4 MG disintegrating tablet Take 1 tablet (4 mg total) by mouth every 8 (eight) hours as needed for nausea or vomiting. 07/18/20   Sabas Sous, MD  pantoprazole (PROTONIX) 40 MG tablet Take 40 mg by mouth 2 (two) times daily before a meal. 12/23/13   [provider]  RIVAROXABAN Carlena Hurl) VTE STARTER PACK (15 &  20 MG) Follow package directions: Take one 15mg  tablet by mouth twice a day. On day 22, switch to one 20mg  tablet once a day. Take with food. 08/09/22   Tyrone Nine, MD  spironolactone (ALDACTONE) 25 MG tablet Take 1 tablet (25 mg total) by mouth daily. 06/27/22   Shalhoub, Deno Lunger, MD  sucralfate (CARAFATE) 1 g tablet Take 1 g by mouth 4 (four) times daily. 07/22/19   [provider]  valACYclovir (VALTREX) 500 MG tablet Take 500 mg by mouth daily as needed (breakout). 08/21/17   [provider]  enoxaparin (LOVENOX) 120 MG/0.8ML SOLN Inject 0.8 mLs (120 mg  total) into the skin every 12 (twelve) hours. 11/30/10 04/16/11  Daryel Gerald, MD  loratadine (CLARITIN) 10 MG tablet Take 10 mg by mouth daily.    04/16/11  [provider]      Allergies    Heparin, Amitriptyline hcl, Azithromycin, Fentanyl, Cymbalta [duloxetine hcl], and Neomycin-bacitracin zn-polymyx    Review of Systems   Review of Systems  Physical Exam Updated Vital Signs BP 124/77 (BP Location: Right Arm)   Pulse 77   Temp 98.3 F (36.8 C) (Oral)   Resp 20   Ht 5\' 4"  (1.626 m)   Wt 118.6 kg   SpO2 93%   BMI 44.88 kg/m  Physical Exam Vitals and nursing note reviewed.  Constitutional:      General: She is not in acute distress.    Appearance: She is well-developed.  HENT:     Head: Normocephalic and atraumatic.     Mouth/Throat:     Mouth: Mucous membranes are moist.  Eyes:     General: Vision grossly intact. Gaze aligned appropriately.     Extraocular Movements: Extraocular movements intact.     Conjunctiva/sclera: Conjunctivae normal.     Comments: Pinpoint  Cardiovascular:     Rate and Rhythm: Normal rate and regular rhythm.     Pulses: Normal pulses.     Heart sounds: Normal heart sounds, S1 normal and S2 normal. No murmur heard.    No friction rub. No gallop.  Pulmonary:     Effort: Pulmonary effort is normal. No respiratory distress.     Breath sounds: Normal breath sounds.  Abdominal:     General: Bowel sounds are normal.     Palpations: Abdomen is soft.     Tenderness: There is no abdominal tenderness. There is no guarding or rebound.     Hernia: No hernia is present.  Musculoskeletal:        General: No swelling.     Cervical back: Full passive range of motion without pain, normal range of motion and neck supple. No spinous process tenderness or muscular tenderness. Normal range of motion.     Right lower leg: No edema.     Left lower leg: No edema.  Skin:    General: Skin is warm and dry.     Capillary Refill: Capillary  refill takes less than 2 seconds.     Findings: No ecchymosis, erythema, rash or wound.  Neurological:     General: No focal deficit present.     GCS: GCS eye subscore is 4. GCS verbal subscore is 5. GCS motor subscore is 6.     Cranial Nerves: Cranial nerves 2-12 are intact.     Sensory: Sensation is intact.     Motor: Motor function is intact.     Comments: Somnolent  Psychiatric:        Attention and  Perception: She is inattentive.        Speech: Speech is slurred.     ED Results / Procedures / Treatments   Labs (all labs ordered are listed, but only abnormal results are displayed) Labs Reviewed - No data to display  EKG None  Radiology VAS [place] LOWER EXTREMITY VENOUS (DVT)  Result Date: 08/09/2022  Lower Venous DVT Study Patient Name:  TARESSA RAUH  Date of Exam:   08/09/2022 Medical Rec #: [id number]     Accession #:    [id number] Date of Birth: 1955-02-07    Patient Gender: F Patient Age:   63 years Exam Location:  [facility] Procedure:      VAS [place] LOWER EXTREMITY VENOUS (DVT) Referring Phys: TIMOTHY OPYD --------------------------------------------------------------------------------  Indications: RLE swelling, recently stopped xarelto, PE.  Comparison Study: Previous 06/25/22 negative. Performing Technologist: McKayla Maag RVT, [place]  Examination Guidelines: A complete evaluation includes B-mode imaging, spectral Doppler, color Doppler, and power Doppler as needed of all accessible portions of each vessel. Bilateral testing is considered an integral part of a complete examination. Limited examinations for reoccurring indications may be performed as noted. The reflux portion of the exam is performed with the patient in reverse Trendelenburg.  +---------+---------------+---------+-----------+----------+--------------+ RIGHT    CompressibilityPhasicitySpontaneityPropertiesThrombus Aging +---------+---------------+---------+-----------+----------+--------------+ CFV      Full            Yes      Yes                                 +---------+---------------+---------+-----------+----------+--------------+ SFJ      Full                                                        +---------+---------------+---------+-----------+----------+--------------+ FV Prox  Full                                                        +---------+---------------+---------+-----------+----------+--------------+ FV Mid   Full                                                        +---------+---------------+---------+-----------+----------+--------------+ FV DistalFull                                                        +---------+---------------+---------+-----------+----------+--------------+ PFV      Full                                                        +---------+---------------+---------+-----------+----------+--------------+ POP      Full  Yes      Yes                                 +---------+---------------+---------+-----------+----------+--------------+ PTV      Full                                                        +---------+---------------+---------+-----------+----------+--------------+ PERO     Full                                                        +---------+---------------+---------+-----------+----------+--------------+   +----+---------------+---------+-----------+----------+--------------+ LEFTCompressibilityPhasicitySpontaneityPropertiesThrombus Aging +----+---------------+---------+-----------+----------+--------------+ CFV Full           Yes      Yes                                 +----+---------------+---------+-----------+----------+--------------+ SFJ Full                                                        +----+---------------+---------+-----------+----------+--------------+     Summary: RIGHT: - There is no evidence of deep vein thrombosis in the lower extremity.  - No  cystic structure found in the popliteal fossa.  LEFT: - No evidence of common femoral vein obstruction.  *See table(s) above for measurements and observations. Electronically signed by Coral Else MD on 08/09/2022 at 9:45:06 PM.    Final    CT Angio Chest Pulmonary Embolism (PE) W or WO Contrast  Result Date: 08/09/2022 CLINICAL DATA:  Pulmonary embolism suspected. Right leg swelling with tenderness to palpation. EXAM: CT ANGIOGRAPHY CHEST WITH CONTRAST TECHNIQUE: Multidetector CT imaging of the chest was performed using the standard protocol during bolus administration of intravenous contrast. Multiplanar CT image reconstructions and MIPs were obtained to evaluate the vascular anatomy. RADIATION DOSE REDUCTION: This exam was performed according to the departmental dose-optimization program which includes automated exposure control, adjustment of the mA and/or kV according to patient size and/or use of iterative reconstruction technique. CONTRAST:  75mL OMNIPAQUE IOHEXOL 350 MG/ML SOLN COMPARISON:  05/17/2020 FINDINGS: Cardiovascular: Satisfactory opacification of the pulmonary arteries to the segmental level. Occlusive filling defect within segmental branches of the right lower lobe. Asymmetric dilatation of the central left pulmonary artery measuring up to 3.6 cm which could be developmental given the asymmetry. No right heart dilatation. No cardiomegaly or pericardial effusion. Mediastinum/Nodes: No mass or adenopathy. Lungs/Pleura: Some degree of tracheal bronchomalacia with bronchial airway thickening and partial collapse of the trachea. No pulmonary infarct or pulmonary edema. Scarring or atelectasis to a mild degree in the bilateral lungs. Upper Abdomen: Known cirrhosis. No acute finding in the covered abdomen. Musculoskeletal: Spondylitic spurring.  No acute finding. Critical Value/emergent results were called by telephone at the time of interpretation on 08/09/2022 at 5:12 am to provider [facility] , who verbally acknowledged these results. Review of the MIP images confirms the  above findings. IMPRESSION: 1. Acute occlusive pulmonary embolism to the right lower lobe affecting segmental branches. 2. 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 Bilateral atelectasis or scarring. 3. 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 4. Cirrhosis. Electronically Signed   By: Tiburcio Pea M.D.   On: 08/09/2022 05:12   DG Tibia/Fibula Right  Result Date: 08/09/2022 CLINICAL DATA:  Pain and swelling EXAM: RIGHT TIBIA AND FIBULA - 2 VIEW; RIGHT FOOT COMPLETE - 3+ VIEW COMPARISON:  Foot radiographs 08/24/2020 FINDINGS: No acute fracture or dislocation. 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 Calcaneal spurs. Soft tissues are radiographically unremarkable. IMPRESSION: No acute fracture or dislocation. Electronically Signed   By: Minerva Fester M.D.   On: 08/09/2022 01:30   DG Foot Complete Right  Result Date: 08/09/2022 CLINICAL DATA:  Pain and swelling EXAM: RIGHT TIBIA AND FIBULA - 2 VIEW; RIGHT FOOT COMPLETE - 3+ VIEW COMPARISON:  Foot radiographs 08/24/2020 FINDINGS: No acute fracture or dislocation. 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 Calcaneal spurs. Soft tissues are radiographically unremarkable. IMPRESSION: No acute fracture or dislocation. Electronically Signed   By: Minerva Fester M.D.   On: 08/09/2022 01:30   DG Chest Port 1 View  Result Date: 08/09/2022 CLINICAL DATA:  Chest pain and leg swelling EXAM: PORTABLE CHEST 1 VIEW COMPARISON:  Radiographs 06/25/2022 FINDINGS: Stable cardiomediastinal silhouette given patient rotation. Aortic atherosclerotic calcification. Left basilar atelectasis or infiltrates. No pleural effusion or pneumothorax. No displaced rib fractures. IMPRESSION: Left basilar atelectasis or infiltrates. Electronically  Signed   By: Minerva Fester M.D.   On: 08/09/2022 01:27    Procedures Procedures    Medications Ordered in ED Medications - No data to display  ED Course/ Medical Decision Making/ A&P                             Medical Decision Making Amount and/or Complexity of Data Reviewed External Data Reviewed: labs, radiology and notes. Radiology: ordered and independent interpretation performed. Decision-making details documented in ED Course.   Differential diagnosis considered includes, but not limited to: TIA; Stroke; ICH; Seizure; electrolyte abnormality; hypoglycemia; toxic/pharmacologic causes; CNS infection; psychiatric disorder  Presents for right leg pain.  I did see the patient in the ED yesterday with her original complaints.  She had pain in the right leg as well as chest pain.  She was diagnosed with recurrent PE.  She was admitted.  Reviewing her records reveals that she did have a DVT study while in the hospital that did not show any evidence of DVT.  Xarelto dosing was changed from once a day to twice a day at discharge.  At arrival, patient is difficult to arouse.  Pupils are pinpoint.  When she is awake speech is slurred.  She clearly is under the influence of opioid medication and is drug-seeking currently as she continues to ask for pain medicine when she is having difficulty staying awake due to the amount of medicine she has in her system.  No additional narcotics.  CT head performed to rule out intracranial bleed as a cause of her altered mental status, as she does take Xarelto and dosing was increased at discharge.  Scan negative for bleed.  Patient still somnolent, overmedicated.  Will sign out to oncoming ER physician, discharged when more awake and alert.        Final Clinical Impression(s) / ED Diagnoses  Final diagnoses:  Opioid abuse [facility])    Rx / DC Orders ED Discharge Orders     None         Joette Schmoker, Canary Brim, MD 08/10/22 [phone]

## 2022-08-10 NOTE — ED Provider Notes (Signed)
7:48 AM Care assumed from Dr. Blinda Leatherwood.  At time of transfer of care, patient is awaiting reassessment after waking up from suspected too much pain medicine overnight.  Workup was reassuring from a head CT standpoint and plan of care will be to discharge when she is ready.  10:53 AM Patient is now awake and eating food.  She reportedly spilled her coffee and dissolved her evening Xarelto tablet for this evening.  I printed a prescription for a single Xarelto tablet.  Pharmacy will also go talk to her.  Patient be discharged for outpatient follow-up.  Patient agrees with plan of care and was discharged in good condition after giving her instructions to be careful with pain medicine so as not to accidentally overdose.   Clinical Impression: 1. Opioid abuse (HCC)     Disposition: Discharge  Condition: Good  I have discussed the results, Dx and Tx plan with the pt(& family if present). He/she/they expressed understanding and agree(s) with the plan. Discharge instructions discussed at great length. Strict return precautions discussed and pt &/or family have verbalized understanding of the instructions. No further questions at time of discharge.    New Prescriptions   RIVAROXABAN (XARELTO) 15 MG TABS TABLET    Take 1 tablet (15 mg total) by mouth daily with supper for 1 dose.    Follow Up: Tracey Harries, MD [address] [place] [address] [phone]     Tracey Harries, MD [address] [place] [address] [phone]     [facility] Emergency Department at [facility] [address] [place] [address] [phone]       Tia Gelb, Canary Brim, MD 08/10/22 1053

## 2022-08-17 ENCOUNTER — Emergency Department (HOSPITAL_COMMUNITY)
Admission: EM | Admit: 2022-08-17 | Discharge: 2022-08-17 | Disposition: A | Discharge status: Home / Self Care | Payer: 59 | Attending: Emergency Medicine | Admitting: Emergency Medicine

## 2022-08-17 ENCOUNTER — Emergency Department (HOSPITAL_COMMUNITY): Payer: 59

## 2022-08-17 DIAGNOSIS — W19XXXA Unspecified fall, initial encounter: Secondary | ICD-10-CM

## 2022-08-17 DIAGNOSIS — Z7951 Long term (current) use of inhaled steroids: Secondary | ICD-10-CM | POA: Diagnosis not present

## 2022-08-17 DIAGNOSIS — I129 Hypertensive chronic kidney disease with stage 1 through stage 4 chronic kidney disease, or unspecified chronic kidney disease: Secondary | ICD-10-CM | POA: Diagnosis not present

## 2022-08-17 DIAGNOSIS — J449 Chronic obstructive pulmonary disease, unspecified: Secondary | ICD-10-CM | POA: Diagnosis not present

## 2022-08-17 DIAGNOSIS — Z86718 Personal history of other venous thrombosis and embolism: Secondary | ICD-10-CM | POA: Diagnosis not present

## 2022-08-17 DIAGNOSIS — M7989 Other specified soft tissue disorders: Secondary | ICD-10-CM | POA: Diagnosis not present

## 2022-08-17 DIAGNOSIS — R059 Cough, unspecified: Secondary | ICD-10-CM | POA: Diagnosis not present

## 2022-08-17 DIAGNOSIS — Z79899 Other long term (current) drug therapy: Secondary | ICD-10-CM | POA: Insufficient documentation

## 2022-08-17 DIAGNOSIS — R079 Chest pain, unspecified: Secondary | ICD-10-CM | POA: Insufficient documentation

## 2022-08-17 DIAGNOSIS — Z7901 Long term (current) use of anticoagulants: Secondary | ICD-10-CM | POA: Insufficient documentation

## 2022-08-17 DIAGNOSIS — W01198A Fall on same level from slipping, tripping and stumbling with subsequent striking against other object, initial encounter: Secondary | ICD-10-CM | POA: Insufficient documentation

## 2022-08-17 DIAGNOSIS — S0990XA Unspecified injury of head, initial encounter: Secondary | ICD-10-CM | POA: Diagnosis present

## 2022-08-17 DIAGNOSIS — N183 Chronic kidney disease, stage 3 unspecified: Secondary | ICD-10-CM | POA: Diagnosis not present

## 2022-08-17 DIAGNOSIS — M79604 Pain in right leg: Secondary | ICD-10-CM | POA: Diagnosis not present

## 2022-08-17 LAB — CBC
HCT: 36.8 % (ref 36.0–46.0)
Hemoglobin: 11.3 g/dL — ABNORMAL LOW (ref 12.0–15.0)
MCH: 27.4 pg (ref 26.0–34.0)
MCHC: 30.7 g/dL (ref 30.0–36.0)
MCV: 89.3 fL (ref 80.0–100.0)
Platelets: 140 10*3/uL — ABNORMAL LOW (ref 150–400)
RBC: 4.12 MIL/uL (ref 3.87–5.11)
RDW: 20.2 % — ABNORMAL HIGH (ref 11.5–15.5)
WBC: 6.7 10*3/uL (ref 4.0–10.5)
nRBC: 0 % (ref 0.0–0.2)

## 2022-08-17 LAB — BASIC METABOLIC PANEL
Anion gap: 11 (ref 5–15)
BUN: 19 mg/dL (ref 8–23)
CO2: 24 mmol/L (ref 22–32)
Calcium: 9.2 mg/dL (ref 8.9–10.3)
Chloride: 100 mmol/L (ref 98–111)
Creatinine, Ser: 1.47 mg/dL — ABNORMAL HIGH (ref 0.44–1.00)
GFR, Estimated: 39 mL/min — ABNORMAL LOW (ref >60)
Glucose, Bld: 99 mg/dL (ref 70–99)
Potassium: 3.3 mmol/L — ABNORMAL LOW (ref 3.5–5.1)
Sodium: 135 mmol/L (ref 135–145)

## 2022-08-17 LAB — ETHANOL: Alcohol, Ethyl (B): <10 mg/dL (ref <10)

## 2022-08-17 LAB — TROPONIN I (HIGH SENSITIVITY): Troponin I (High Sensitivity): 9 ng/L (ref <18)

## 2022-08-17 NOTE — ED Provider Notes (Signed)
[place] EMERGENCY DEPARTMENT AT [facility] Provider Note   CSN: [id number] Arrival date & time: 08/17/22  0440     History  Chief Complaint  Patient presents with   Leg Pain   Chest Pain    [name] is a 67 y.o. female.  The history is provided by the patient.   Patient with extensive history including VTE on Xarelto, hypertension, chronic pain, cirrhosis presents with multiple complaints.  Patient reports she has had a recent pain and swelling in the right leg.  No recent trauma.  She also reports chest pain and pressure that started several hours ago that is improving.  No shortness of breath, no fever.  She reports mild cough without hemoptysis.  She does report yesterday she lost her balance and did fall hitting her neck and head.  No LOC.  She reports compliance for her Xarelto.  Patient recently admitted for recurrent PE   Past Medical History:  Diagnosis Date   Agoraphobia with panic attacks    Altered mental state 10/26/2013   Ankle fracture, left 11/27/2010   S/p ORIF 11/2    Anxiety    Arthritis    "knees; lower back" (10/30/2013)   Bipolar disorder (HCC)    Cavitary pneumonia    Cirrhosis (HCC)    CKD (chronic kidney disease), stage III (HCC)    COPD (chronic obstructive pulmonary disease) (HCC)    Depression    DVT (deep venous thrombosis) (HCC) 09/2013   RLE   GERD (gastroesophageal reflux disease)    Heart murmur    Hepatitis B    History of blood transfusion    "due to excessive blood loss before hysterectomy"   History of urinary tract infection    Hypertension    currently on no medication    Kidney stones    Pneumonia    "4 times in the past year" (10/30/2013)   Pulmonary emboli (HCC) 09/2013   Shortness of breath dyspnea    increased exertion;exercise   Urinary frequency    Urinary incontinence     Home Medications Prior to Admission medications   Medication Sig Start Date End Date Taking? Authorizing Provider  albuterol  (PROAIR HFA) 108 (90 Base) MCG/ACT inhaler INHALE 2 PUFFS INTO THE LUNGS EVERY 4 HOURS AS NEEDED FOR WHEEZING Patient taking differently: Inhale 2 puffs into the lungs every 4 (four) hours as needed for shortness of breath or wheezing. 12/04/18   Oretha Milch, MD  albuterol (PROVENTIL) (2.5 MG/3ML) 0.083% nebulizer solution Take 3 mLs (2.5 mg total) by nebulization every 4 (four) hours as needed for wheezing or shortness of breath. 12/04/18   Oretha Milch, MD  ALPRAZolam Prudy Feeler) 1 MG tablet Take 1 tablet (1 mg total) by mouth 3 (three) times daily. 06/27/22   Shalhoub, Deno Lunger, MD  baclofen (LIORESAL) 20 MG tablet Take 20 mg by mouth at bedtime. 04/22/20   [provider]  budesonide-formoterol (SYMBICORT) 160-4.5 MCG/ACT inhaler Inhale 2 puffs into the lungs 2 (two) times daily. 12/04/18 06/24/22  Oretha Milch, MD  buPROPion (WELLBUTRIN XL) 150 MG 24 hr tablet Take 150 mg by mouth every morning.    [provider]  carvedilol (COREG) 12.5 MG tablet Take 1 tablet (12.5 mg total) by mouth 2 (two) times daily with a meal. 06/27/22   Shalhoub, Deno Lunger, MD  Cholecalciferol (VITAMIN D3) 50 MCG (2000 UT) TABS Take 2,000 Units by mouth daily.    [provider]  clotrimazole-betamethasone (LOTRISONE) cream Apply to affected area 2 times daily prn Patient taking differently: Apply 1 application  topically 2 (two) times daily as needed (rash). Apply to affected area 2 times daily prn 04/29/20   Cathren Laine, MD  docusate sodium (COLACE) 100 MG capsule Take 100 mg by mouth 2 (two) times daily.    [provider]  doxepin (SINEQUAN) 100 MG capsule Take 100 mg by mouth at bedtime. 06/01/22   [provider]  entecavir (BARACLUDE) 0.5 MG tablet Take 0.5 mg by mouth every other day. 11/25/19   [provider]  famotidine (PEPCID) 20 MG tablet Take 20 mg by mouth 2 (two) times daily. 10/28/19   [provider]  gabapentin (NEURONTIN) 300 MG capsule Take 300  mg by mouth 3 (three) times daily. 07/13/20   [provider]  levocetirizine (XYZAL) 5 MG tablet Take 5 mg by mouth every evening. 12/02/19   [provider]  montelukast (SINGULAIR) 10 MG tablet Take 10 mg by mouth at bedtime.    [provider]  ondansetron (ZOFRAN ODT) 4 MG disintegrating tablet Take 1 tablet (4 mg total) by mouth every 8 (eight) hours as needed for nausea or vomiting. 07/18/20   Sabas Sous, MD  pantoprazole (PROTONIX) 40 MG tablet Take 40 mg by mouth 2 (two) times daily before a meal. 12/23/13   [provider]  Rivaroxaban (XARELTO) 15 MG TABS tablet Take 1 tablet (15 mg total) by mouth daily with supper for 1 dose. 08/10/22 08/11/22  Tegeler, Canary Brim, MD  RIVAROXABAN Carlena Hurl) VTE STARTER PACK (15 & 20 MG) Follow package directions: Take one 15mg  tablet by mouth twice a day. On day 22, switch to one 20mg  tablet once a day. Take with food. 08/09/22   Tyrone Nine, MD  spironolactone (ALDACTONE) 25 MG tablet Take 1 tablet (25 mg total) by mouth daily. 06/27/22   Shalhoub, Deno Lunger, MD  sucralfate (CARAFATE) 1 g tablet Take 1 g by mouth 4 (four) times daily. 07/22/19   [provider]  valACYclovir (VALTREX) 500 MG tablet Take 500 mg by mouth daily as needed (breakout). 08/21/17   [provider]  enoxaparin (LOVENOX) 120 MG/0.8ML SOLN Inject 0.8 mLs (120 mg total) into the skin every 12 (twelve) hours. 11/30/10 04/16/11  Daryel Gerald, MD  loratadine (CLARITIN) 10 MG tablet Take 10 mg by mouth daily.    04/16/11  [provider]      Allergies    Heparin, Amitriptyline hcl, Azithromycin, Fentanyl, Cymbalta [duloxetine hcl], and Neomycin-bacitracin zn-polymyx    Review of Systems   Review of Systems  Musculoskeletal:  Positive for arthralgias.    Physical Exam Updated Vital Signs BP 135/77 (BP Location: Right Arm)   Pulse 90   Temp 98.9 F (37.2 C) (Oral)   Resp 16   Ht 1.626 m (5\' 4" )   Wt  106.6 kg   SpO2 99%   BMI 40.34 kg/m  Physical Exam CONSTITUTIONAL chronically ill-appearing, mildly somnolent HEAD: Normocephalic/atraumatic EYES: EOMI/PERRL ENMT: Mucous membranes moist NECK: supple no meningeal signs SPINE/BACK:entire spine nontender No bruising/crepitance/stepoffs noted to spine Tenderness to left cervical paraspinal region No bruising CV: S1/S2 noted, no murmurs/rubs/gallops noted LUNGS: Lungs are clear to auscultation bilaterally, no apparent distress ABDOMEN: soft, nontender NEURO: Pt is awake/alert/appropriate, moves all extremitiesx4.  No facial droop.   EXTREMITIES: pulses normal/equal, full ROM Distal pulses equal intact in both feet.  There is pitting edema of the right lower extremity  but no significant erythema.  Full range of motion of both lower extremities SKIN: warm, color normal PSYCH: Mildly somnolent, but does have pressured speech when awake  ED Results / Procedures / Treatments   Labs (all labs ordered are listed, but only abnormal results are displayed) Labs Reviewed  CBC - Abnormal; Notable for the following components:      Result Value   Hemoglobin 11.3 (*)    RDW 20.2 (*)    Platelets 140 (*)    All other components within normal limits  BASIC METABOLIC PANEL  ETHANOL  RAPID URINE DRUG SCREEN, HOSP PERFORMED  TROPONIN I (HIGH SENSITIVITY)    EKG EKG Interpretation Date/Time:  Thursday August 17 2022 04:47:42 EDT Ventricular Rate:  90 PR Interval:  161 QRS Duration:  105 QT Interval:  362 QTC Calculation: 443 R Axis:   -41  Text Interpretation: Sinus or ectopic atrial rhythm LVH with secondary repolarization abnormality Confirmed by Zadie Rhine ([id number]) on 08/17/2022 4:54:05 AM  Radiology CT Head Wo Contrast  Result Date: 08/17/2022 CLINICAL DATA:  67 year old female with history of trauma from a fall. EXAM: CT HEAD WITHOUT CONTRAST CT CERVICAL SPINE WITHOUT CONTRAST TECHNIQUE: Multidetector CT imaging of the head and  cervical spine was performed following the standard protocol without intravenous contrast. Multiplanar CT image reconstructions of the cervical spine were also generated. RADIATION DOSE REDUCTION: This exam was performed according to the departmental dose-optimization program which includes automated exposure control, adjustment of the mA and/or kV according to patient size and/or use of iterative reconstruction technique. COMPARISON:  Head CT 08/10/2022. FINDINGS: CT HEAD FINDINGS Brain: No evidence of acute infarction, hemorrhage, hydrocephalus, extra-axial collection or mass lesion/mass effect. Vascular: No hyperdense vessel or unexpected calcification. Skull: Normal. Negative for fracture or focal lesion. Sinuses/Orbits: No acute finding. Mild multifocal mucosal thickening in the right posterior ethmoid and right sphenoid sinuses. Other: None. CT CERVICAL SPINE FINDINGS Alignment: Normal. Skull base and vertebrae: No acute fracture. No primary bone lesion or focal pathologic process. Orthopedic fixation hardware in the left facets and lamina from C4-C7, presumably from prior hemilaminectomy. Soft tissues and spinal canal: No prevertebral fluid or swelling. No visible canal hematoma. Disc levels: Mild multilevel degenerative disc disease, most pronounced at C5-C6 and C6-C7. Mild multilevel facet arthropathy. Upper chest: Mild scarring in the apex of the right upper lobe. Severe compression of the trachea during apparent expiration, indicative of severe tracheomalacia. Other: None. IMPRESSION: 1. No evidence of significant acute traumatic injury to the skull, brain or cervical spine. 2. The appearance of the brain is normal. 3. Multilevel degenerative disc disease and cervical spondylosis with postoperative changes in the cervical spine, as above. Electronically Signed   By: Trudie Reed M.D.   On: 08/17/2022 06:40   CT Cervical Spine Wo Contrast  Result Date: 08/17/2022 CLINICAL DATA:  67 year old female  with history of trauma from a fall. EXAM: CT HEAD WITHOUT CONTRAST CT CERVICAL SPINE WITHOUT CONTRAST TECHNIQUE: Multidetector CT imaging of the head and cervical spine was performed following the standard protocol without intravenous contrast. Multiplanar CT image reconstructions of the cervical spine were also generated. RADIATION DOSE REDUCTION: This exam was performed according to the departmental dose-optimization program which includes automated exposure control, adjustment of the mA and/or kV according to patient size and/or use of iterative reconstruction technique. COMPARISON:  Head CT 08/10/2022. FINDINGS: CT HEAD FINDINGS Brain: No evidence of acute infarction, hemorrhage, hydrocephalus, extra-axial collection or mass lesion/mass effect. Vascular: No hyperdense vessel or unexpected calcification.  Skull: Normal. Negative for fracture or focal lesion. Sinuses/Orbits: No acute finding. Mild multifocal mucosal thickening in the right posterior ethmoid and right sphenoid sinuses. Other: None. CT CERVICAL SPINE FINDINGS Alignment: Normal. Skull base and vertebrae: No acute fracture. No primary bone lesion or focal pathologic process. Orthopedic fixation hardware in the left facets and lamina from C4-C7, presumably from prior hemilaminectomy. Soft tissues and spinal canal: No prevertebral fluid or swelling. No visible canal hematoma. Disc levels: Mild multilevel degenerative disc disease, most pronounced at C5-C6 and C6-C7. Mild multilevel facet arthropathy. Upper chest: Mild scarring in the apex of the right upper lobe. Severe compression of the trachea during apparent expiration, indicative of severe tracheomalacia. Other: None. IMPRESSION: 1. No evidence of significant acute traumatic injury to the skull, brain or cervical spine. 2. The appearance of the brain is normal. 3. Multilevel degenerative disc disease and cervical spondylosis with postoperative changes in the cervical spine, as above. Electronically  Signed   By: Trudie Reed M.D.   On: 08/17/2022 06:40    Procedures Procedures    Medications Ordered in ED Medications - No data to display  ED Course/ Medical Decision Making/ A&P Clinical Course as of 08/17/22 0714  Thu Aug 17, 2022  0610 Patient with recent admission for recurrent PE, now back on Xarelto.  She is already had imaging including x-ray and ultrasound imaging of right leg without any acute findings.  Patient now reports she fell and hit her head and neck.  CT head and C-spine ordered.  Will also workup her chest pain.  Patient also asking frequently for pain medications, she appears more concerned with what pain medicine she will receive then her recent fall.  However she had a recent ER visit for accidental opioid overdose, will defer meds for now [DW]  0712 Pt with labs/imaging pending.  Signed out to Dr Rush Landmark.  If all negative she can be discharged [DW]    Clinical Course User Index [DW] Zadie Rhine, MD                             Medical Decision Making Amount and/or Complexity of Data Reviewed Labs: ordered. Radiology: ordered.   This patient presents to the ED for concern of chest pain, this involves an extensive number of treatment options, and is a complaint that carries with it a high risk of complications and morbidity.  The differential diagnosis includes but is not limited to acute coronary syndrome, aortic dissection, pulmonary embolism, pericarditis, pneumothorax, pneumonia, myocarditis, pleurisy, esophageal rupture    Comorbidities that complicate the patient evaluation: Patient's presentation is complicated by their history of PE, chronic pain  Social Determinants of Health: Patient's  chronic pain   increases the complexity of managing their presentation  Additional history obtained: Records reviewed previous admission documents  Lab Tests: I Ordered, and personally interpreted labs.  The pertinent results include:  mild  anemia  Imaging Studies ordered: I ordered imaging studies including  CT head  I independently visualized and interpreted imaging which showed negative CT head I agree with the radiologist interpretation    Reevaluation: After the interventions noted above, I reevaluated the patient and found that they have :stayed the same  Complexity of problems addressed: Patient's presentation is most consistent with  acute presentation with potential threat to life or bodily function           Final Clinical Impression(s) / ED Diagnoses Final diagnoses:  None  Rx / DC Orders ED Discharge Orders     None         Zadie Rhine, MD 08/17/22 [phone]

## 2022-08-17 NOTE — Discharge Instructions (Signed)
Your history, exam, and workup today did not reveal significant traumatic injuries from your fall.  I suspect that aggravated some of your chronic pain problems as well.  Please call and follow-up with your primary doctor to discuss ongoing pain management.  Please be careful with your pain medicine use as it does not cause you to be more drowsy or have more falls.  Please continue eating and drinking well as your potassium was just slightly low.  Please follow-up with your primary doctor for this as well.  If any symptoms change or worsen, return to the nearest emergency department.

## 2022-08-17 NOTE — ED Provider Notes (Signed)
7:13 AM Care assumed from Dr. Bebe Shaggy.  At time of transfer of care, patient is awaiting results of labs, chest x-ray, and reassessment.  Patient had imaging of head and neck that were reassuring after fall on blood thinners.  Plan of care will be to discharge home for outpatient pain management.   8:28 AM Patient's lab testing returned without critical abnormalities.  Mild anemia and mild hypokalemia but troponin was normal.  Alcohol undetected.  Patient plan will be to discharge home after the troponin was negative and without other critical abnormalities.  Patient reassessed and she is still having reported chronic pain but agrees with the reassuring workup and discharge home.  She will follow-up with a primary doctor to continue her outpatient pain regimen.  We agreed to not give her new pain medicine today given her frequent falls and recent visit for accidental narcotic overdose.  Patient agrees and will eat and drink and will be discharged home.  Clinical Impression: 1. Fall, initial encounter     Disposition: Discharge  Condition: Good  I have discussed the results, Dx and Tx plan with the pt(& family if present). He/she/they expressed understanding and agree(s) with the plan. Discharge instructions discussed at great length. Strict return precautions discussed and pt &/or family have verbalized understanding of the instructions. No further questions at time of discharge.    New Prescriptions   No medications on file    Follow Up: Tracey Harries, MD [address] [place] [address] [phone]     [facility] Emergency Department at [facility] [address] [place] [address] [phone]        Alexiss Iturralde, Canary Brim, MD 08/17/22 [phone]

## 2022-08-17 NOTE — ED Triage Notes (Signed)
Pt BIB EMS for increasingly worsening right leg pain/swelling and chest pain onset about 2 hours ago. Hx of PE, on Xarelto. Pt also had a fall yesterday - lost her balance and hit her neck against the wooden window.

## 2022-08-23 ENCOUNTER — Other Ambulatory Visit: Payer: Self-pay

## 2022-08-23 ENCOUNTER — Encounter (HOSPITAL_COMMUNITY): Payer: Self-pay | Admitting: Emergency Medicine

## 2022-08-23 ENCOUNTER — Inpatient Hospital Stay (HOSPITAL_COMMUNITY)
Admission: EM | Admit: 2022-08-23 | Discharge: 2022-08-25 | DRG: 092 | Disposition: A | Discharge status: Home / Self Care | Payer: 59 | Attending: Family Medicine | Admitting: Family Medicine

## 2022-08-23 DIAGNOSIS — Z7951 Long term (current) use of inhaled steroids: Secondary | ICD-10-CM

## 2022-08-23 DIAGNOSIS — K219 Gastro-esophageal reflux disease without esophagitis: Secondary | ICD-10-CM | POA: Diagnosis present

## 2022-08-23 DIAGNOSIS — Z86718 Personal history of other venous thrombosis and embolism: Secondary | ICD-10-CM

## 2022-08-23 DIAGNOSIS — Z90710 Acquired absence of both cervix and uterus: Secondary | ICD-10-CM

## 2022-08-23 DIAGNOSIS — T426X5A Adverse effect of other antiepileptic and sedative-hypnotic drugs, initial encounter: Secondary | ICD-10-CM | POA: Diagnosis present

## 2022-08-23 DIAGNOSIS — F411 Generalized anxiety disorder: Secondary | ICD-10-CM | POA: Diagnosis present

## 2022-08-23 DIAGNOSIS — G9341 Metabolic encephalopathy: Secondary | ICD-10-CM | POA: Diagnosis present

## 2022-08-23 DIAGNOSIS — T43295A Adverse effect of other antidepressants, initial encounter: Secondary | ICD-10-CM | POA: Diagnosis present

## 2022-08-23 DIAGNOSIS — Z881 Allergy status to other antibiotic agents status: Secondary | ICD-10-CM

## 2022-08-23 DIAGNOSIS — N200 Calculus of kidney: Secondary | ICD-10-CM | POA: Diagnosis present

## 2022-08-23 DIAGNOSIS — I82409 Acute embolism and thrombosis of unspecified deep veins of unspecified lower extremity: Secondary | ICD-10-CM | POA: Diagnosis present

## 2022-08-23 DIAGNOSIS — I700 Atherosclerosis of aorta: Secondary | ICD-10-CM | POA: Diagnosis present

## 2022-08-23 DIAGNOSIS — I10 Essential (primary) hypertension: Secondary | ICD-10-CM | POA: Diagnosis present

## 2022-08-23 DIAGNOSIS — Z91148 Patient's other noncompliance with medication regimen for other reason: Secondary | ICD-10-CM

## 2022-08-23 DIAGNOSIS — F32A Depression, unspecified: Secondary | ICD-10-CM | POA: Diagnosis present

## 2022-08-23 DIAGNOSIS — T428X5A Adverse effect of antiparkinsonism drugs and other central muscle-tone depressants, initial encounter: Secondary | ICD-10-CM | POA: Diagnosis present

## 2022-08-23 DIAGNOSIS — G9340 Encephalopathy, unspecified: Principal | ICD-10-CM

## 2022-08-23 DIAGNOSIS — Z5900 Homelessness unspecified: Secondary | ICD-10-CM

## 2022-08-23 DIAGNOSIS — G928 Other toxic encephalopathy: Principal | ICD-10-CM | POA: Diagnosis present

## 2022-08-23 DIAGNOSIS — Z801 Family history of malignant neoplasm of trachea, bronchus and lung: Secondary | ICD-10-CM

## 2022-08-23 DIAGNOSIS — T43015A Adverse effect of tricyclic antidepressants, initial encounter: Secondary | ICD-10-CM | POA: Diagnosis present

## 2022-08-23 DIAGNOSIS — I130 Hypertensive heart and chronic kidney disease with heart failure and stage 1 through stage 4 chronic kidney disease, or unspecified chronic kidney disease: Secondary | ICD-10-CM | POA: Diagnosis present

## 2022-08-23 DIAGNOSIS — J4489 Other specified chronic obstructive pulmonary disease: Secondary | ICD-10-CM | POA: Diagnosis present

## 2022-08-23 DIAGNOSIS — Z86711 Personal history of pulmonary embolism: Secondary | ICD-10-CM

## 2022-08-23 DIAGNOSIS — B1910 Unspecified viral hepatitis B without hepatic coma: Secondary | ICD-10-CM | POA: Diagnosis present

## 2022-08-23 DIAGNOSIS — Z7901 Long term (current) use of anticoagulants: Secondary | ICD-10-CM

## 2022-08-23 DIAGNOSIS — Z79899 Other long term (current) drug therapy: Secondary | ICD-10-CM

## 2022-08-23 DIAGNOSIS — I5032 Chronic diastolic (congestive) heart failure: Secondary | ICD-10-CM | POA: Diagnosis present

## 2022-08-23 DIAGNOSIS — F319 Bipolar disorder, unspecified: Secondary | ICD-10-CM | POA: Diagnosis present

## 2022-08-23 DIAGNOSIS — K7460 Unspecified cirrhosis of liver: Secondary | ICD-10-CM | POA: Diagnosis present

## 2022-08-23 DIAGNOSIS — Z87891 Personal history of nicotine dependence: Secondary | ICD-10-CM

## 2022-08-23 DIAGNOSIS — Z8673 Personal history of transient ischemic attack (TIA), and cerebral infarction without residual deficits: Secondary | ICD-10-CM

## 2022-08-23 DIAGNOSIS — J45909 Unspecified asthma, uncomplicated: Secondary | ICD-10-CM | POA: Diagnosis present

## 2022-08-23 DIAGNOSIS — Z6841 Body Mass Index (BMI) 40.0 and over, adult: Secondary | ICD-10-CM

## 2022-08-23 DIAGNOSIS — T424X5A Adverse effect of benzodiazepines, initial encounter: Secondary | ICD-10-CM | POA: Diagnosis present

## 2022-08-23 DIAGNOSIS — E876 Hypokalemia: Secondary | ICD-10-CM | POA: Diagnosis present

## 2022-08-23 DIAGNOSIS — Z823 Family history of stroke: Secondary | ICD-10-CM

## 2022-08-23 DIAGNOSIS — E871 Hypo-osmolality and hyponatremia: Secondary | ICD-10-CM | POA: Diagnosis present

## 2022-08-23 DIAGNOSIS — Z888 Allergy status to other drugs, medicaments and biological substances status: Secondary | ICD-10-CM

## 2022-08-23 DIAGNOSIS — N183 Chronic kidney disease, stage 3 unspecified: Secondary | ICD-10-CM | POA: Diagnosis present

## 2022-08-23 DIAGNOSIS — N1832 Chronic kidney disease, stage 3b: Secondary | ICD-10-CM | POA: Diagnosis present

## 2022-08-23 NOTE — ED Triage Notes (Signed)
Patient BIB EMS for multiple complaints.  Reports she is having confusion per daughter, knee pain, and pain around "my belly button."  Has hx of anxiety.  Continues to add more complaints as triage completed.  Was seen for similar symptoms on 7/25

## 2022-08-24 ENCOUNTER — Emergency Department (HOSPITAL_COMMUNITY): Payer: 59

## 2022-08-24 ENCOUNTER — Inpatient Hospital Stay (HOSPITAL_COMMUNITY): Payer: 59

## 2022-08-24 ENCOUNTER — Encounter (HOSPITAL_COMMUNITY): Payer: Self-pay

## 2022-08-24 DIAGNOSIS — G9340 Encephalopathy, unspecified: Secondary | ICD-10-CM | POA: Diagnosis present

## 2022-08-24 DIAGNOSIS — Z79899 Other long term (current) drug therapy: Secondary | ICD-10-CM | POA: Diagnosis not present

## 2022-08-24 DIAGNOSIS — F411 Generalized anxiety disorder: Secondary | ICD-10-CM | POA: Diagnosis present

## 2022-08-24 DIAGNOSIS — B1910 Unspecified viral hepatitis B without hepatic coma: Secondary | ICD-10-CM | POA: Diagnosis present

## 2022-08-24 DIAGNOSIS — I700 Atherosclerosis of aorta: Secondary | ICD-10-CM | POA: Diagnosis present

## 2022-08-24 DIAGNOSIS — N1831 Chronic kidney disease, stage 3a: Secondary | ICD-10-CM

## 2022-08-24 DIAGNOSIS — Z823 Family history of stroke: Secondary | ICD-10-CM | POA: Diagnosis not present

## 2022-08-24 DIAGNOSIS — K7460 Unspecified cirrhosis of liver: Secondary | ICD-10-CM

## 2022-08-24 DIAGNOSIS — Z6841 Body Mass Index (BMI) 40.0 and over, adult: Secondary | ICD-10-CM | POA: Diagnosis not present

## 2022-08-24 DIAGNOSIS — N1832 Chronic kidney disease, stage 3b: Secondary | ICD-10-CM | POA: Diagnosis present

## 2022-08-24 DIAGNOSIS — K219 Gastro-esophageal reflux disease without esophagitis: Secondary | ICD-10-CM | POA: Diagnosis present

## 2022-08-24 DIAGNOSIS — F319 Bipolar disorder, unspecified: Secondary | ICD-10-CM | POA: Diagnosis present

## 2022-08-24 DIAGNOSIS — Z888 Allergy status to other drugs, medicaments and biological substances status: Secondary | ICD-10-CM | POA: Diagnosis not present

## 2022-08-24 DIAGNOSIS — Z881 Allergy status to other antibiotic agents status: Secondary | ICD-10-CM | POA: Diagnosis not present

## 2022-08-24 DIAGNOSIS — I130 Hypertensive heart and chronic kidney disease with heart failure and stage 1 through stage 4 chronic kidney disease, or unspecified chronic kidney disease: Secondary | ICD-10-CM | POA: Diagnosis present

## 2022-08-24 DIAGNOSIS — J4489 Other specified chronic obstructive pulmonary disease: Secondary | ICD-10-CM | POA: Diagnosis present

## 2022-08-24 DIAGNOSIS — G9341 Metabolic encephalopathy: Secondary | ICD-10-CM | POA: Diagnosis not present

## 2022-08-24 DIAGNOSIS — Z7901 Long term (current) use of anticoagulants: Secondary | ICD-10-CM | POA: Diagnosis not present

## 2022-08-24 DIAGNOSIS — Z5900 Homelessness unspecified: Secondary | ICD-10-CM | POA: Diagnosis not present

## 2022-08-24 DIAGNOSIS — I10 Essential (primary) hypertension: Secondary | ICD-10-CM | POA: Diagnosis not present

## 2022-08-24 DIAGNOSIS — I5032 Chronic diastolic (congestive) heart failure: Secondary | ICD-10-CM | POA: Diagnosis present

## 2022-08-24 DIAGNOSIS — E876 Hypokalemia: Secondary | ICD-10-CM | POA: Diagnosis present

## 2022-08-24 DIAGNOSIS — G928 Other toxic encephalopathy: Secondary | ICD-10-CM | POA: Diagnosis present

## 2022-08-24 DIAGNOSIS — F32A Depression, unspecified: Secondary | ICD-10-CM

## 2022-08-24 DIAGNOSIS — E871 Hypo-osmolality and hyponatremia: Secondary | ICD-10-CM | POA: Diagnosis present

## 2022-08-24 DIAGNOSIS — Z86711 Personal history of pulmonary embolism: Secondary | ICD-10-CM | POA: Diagnosis not present

## 2022-08-24 DIAGNOSIS — J45909 Unspecified asthma, uncomplicated: Secondary | ICD-10-CM | POA: Diagnosis present

## 2022-08-24 DIAGNOSIS — Z86718 Personal history of other venous thrombosis and embolism: Secondary | ICD-10-CM | POA: Diagnosis not present

## 2022-08-24 DIAGNOSIS — Z801 Family history of malignant neoplasm of trachea, bronchus and lung: Secondary | ICD-10-CM | POA: Diagnosis not present

## 2022-08-24 DIAGNOSIS — Z87891 Personal history of nicotine dependence: Secondary | ICD-10-CM | POA: Diagnosis not present

## 2022-08-24 HISTORY — DX: Unspecified asthma, uncomplicated: J45.909

## 2022-08-24 LAB — URINALYSIS, W/ REFLEX TO CULTURE (INFECTION SUSPECTED)
Bacteria, UA: NONE SEEN
Bilirubin Urine: NEGATIVE
Glucose, UA: NEGATIVE mg/dL
Ketones, ur: NEGATIVE mg/dL
Leukocytes,Ua: NEGATIVE
Nitrite: NEGATIVE
Protein, ur: 30 mg/dL — AB
Specific Gravity, Urine: 1.004 — ABNORMAL LOW (ref 1.005–1.030)
pH: 7 (ref 5.0–8.0)

## 2022-08-24 LAB — OSMOLALITY, URINE: Osmolality, Ur: 189 mOsm/kg — ABNORMAL LOW (ref 300–900)

## 2022-08-24 LAB — BLOOD GAS, VENOUS
Acid-base deficit: 0.8 mmol/L (ref 0.0–2.0)
Bicarbonate: 24.9 mmol/L (ref 20.0–28.0)
O2 Saturation: 60.6 %
Patient temperature: 37
pCO2, Ven: 44 mmHg (ref 44–60)
pH, Ven: 7.36 (ref 7.25–7.43)
pO2, Ven: 36 mmHg (ref 32–45)

## 2022-08-24 LAB — COMPREHENSIVE METABOLIC PANEL
ALT: 29 U/L (ref 0–44)
AST: 51 U/L — ABNORMAL HIGH (ref 15–41)
Albumin: 4.2 g/dL (ref 3.5–5.0)
Alkaline Phosphatase: 100 U/L (ref 38–126)
Anion gap: 13 (ref 5–15)
BUN: 16 mg/dL (ref 8–23)
CO2: 22 mmol/L (ref 22–32)
Calcium: 9.8 mg/dL (ref 8.9–10.3)
Chloride: 99 mmol/L (ref 98–111)
Creatinine, Ser: 1.33 mg/dL — ABNORMAL HIGH (ref 0.44–1.00)
GFR, Estimated: 44 mL/min — ABNORMAL LOW (ref >60)
Glucose, Bld: 97 mg/dL (ref 70–99)
Potassium: 3.3 mmol/L — ABNORMAL LOW (ref 3.5–5.1)
Sodium: 134 mmol/L — ABNORMAL LOW (ref 135–145)
Total Bilirubin: 0.8 mg/dL (ref 0.3–1.2)
Total Protein: 8.4 g/dL — ABNORMAL HIGH (ref 6.5–8.1)

## 2022-08-24 LAB — CBC WITH DIFFERENTIAL/PLATELET
Abs Immature Granulocytes: 0.03 10*3/uL (ref 0.00–0.07)
Basophils Absolute: 0 10*3/uL (ref 0.0–0.1)
Basophils Relative: 0 %
Eosinophils Absolute: 0 10*3/uL (ref 0.0–0.5)
Eosinophils Relative: 0 %
HCT: 40.4 % (ref 36.0–46.0)
Hemoglobin: 12.9 g/dL (ref 12.0–15.0)
Immature Granulocytes: 0 %
Lymphocytes Relative: 17 %
Lymphs Abs: 1.6 10*3/uL (ref 0.7–4.0)
MCH: 27.8 pg (ref 26.0–34.0)
MCHC: 31.9 g/dL (ref 30.0–36.0)
MCV: 87.1 fL (ref 80.0–100.0)
Monocytes Absolute: 0.6 10*3/uL (ref 0.1–1.0)
Monocytes Relative: 6 %
Neutro Abs: 7.2 10*3/uL (ref 1.7–7.7)
Neutrophils Relative %: 77 %
Platelets: 167 10*3/uL (ref 150–400)
RBC: 4.64 MIL/uL (ref 3.87–5.11)
RDW: 19.3 % — ABNORMAL HIGH (ref 11.5–15.5)
WBC: 9.4 10*3/uL (ref 4.0–10.5)
nRBC: 0 % (ref 0.0–0.2)

## 2022-08-24 LAB — OSMOLALITY: Osmolality: 290 mOsm/kg (ref 275–295)

## 2022-08-24 LAB — RAPID URINE DRUG SCREEN, HOSP PERFORMED
Amphetamines: NOT DETECTED
Barbiturates: NOT DETECTED
Benzodiazepines: POSITIVE — AB
Cocaine: NOT DETECTED
Opiates: NOT DETECTED
Tetrahydrocannabinol: NOT DETECTED

## 2022-08-24 LAB — MAGNESIUM: Magnesium: 1.9 mg/dL (ref 1.7–2.4)

## 2022-08-24 LAB — AMMONIA: Ammonia: 20 umol/L (ref 9–35)

## 2022-08-24 LAB — ETHANOL: Alcohol, Ethyl (B): <10 mg/dL (ref <10)

## 2022-08-24 LAB — SODIUM, URINE, RANDOM: Sodium, Ur: 56 mmol/L

## 2022-08-24 MED ORDER — SODIUM CHLORIDE (PF) 0.9 % IJ SOLN
INTRAMUSCULAR | Status: AC
  Filled 2022-08-24: qty 50

## 2022-08-24 MED ORDER — RIVAROXABAN 15 MG PO TABS
15.0000 mg | ORAL_TABLET | Freq: Two times a day (BID) | ORAL | Status: DC
  Filled 2022-08-24: qty 1

## 2022-08-24 MED ORDER — SODIUM CHLORIDE 0.9 % IV SOLN: 250.0000 mL | INTRAVENOUS | Status: DC | PRN

## 2022-08-24 MED ORDER — MONTELUKAST SODIUM 10 MG PO TABS
10.0000 mg | ORAL_TABLET | Freq: Every day | ORAL | Status: DC
  Administered 2022-08-24: 10 mg via ORAL
  Filled 2022-08-24: qty 1

## 2022-08-24 MED ORDER — HYDRALAZINE HCL 20 MG/ML IJ SOLN: 10.0000 mg | Freq: Four times a day (QID) | INTRAMUSCULAR | Status: DC | PRN

## 2022-08-24 MED ORDER — RIVAROXABAN 20 MG PO TABS: 20.0000 mg | ORAL_TABLET | Freq: Every day | ORAL | Status: DC

## 2022-08-24 MED ORDER — RIVAROXABAN 20 MG PO TABS
30.0000 mg | ORAL_TABLET | Freq: Once | ORAL | Status: AC
  Administered 2022-08-24: 30 mg via ORAL
  Filled 2022-08-24: qty 1

## 2022-08-24 MED ORDER — SODIUM CHLORIDE 0.9% FLUSH
3.0000 mL | Freq: Two times a day (BID) | INTRAVENOUS | Status: DC
  Administered 2022-08-24 – 2022-08-25 (×3): 3 mL via INTRAVENOUS

## 2022-08-24 MED ORDER — SPIRONOLACTONE 25 MG PO TABS
25.0000 mg | ORAL_TABLET | Freq: Every day | ORAL | Status: DC
  Administered 2022-08-24 – 2022-08-25 (×2): 25 mg via ORAL
  Filled 2022-08-24 (×2): qty 1

## 2022-08-24 MED ORDER — ALBUTEROL SULFATE (2.5 MG/3ML) 0.083% IN NEBU: 3.0000 mL | INHALATION_SOLUTION | RESPIRATORY_TRACT | Status: DC | PRN

## 2022-08-24 MED ORDER — DOXEPIN HCL 50 MG PO CAPS
100.0000 mg | ORAL_CAPSULE | Freq: Every day | ORAL | Status: DC
  Administered 2022-08-24: 100 mg via ORAL
  Filled 2022-08-24 (×2): qty 2

## 2022-08-24 MED ORDER — LORAZEPAM 2 MG/ML IJ SOLN
0.5000 mg | Freq: Once | INTRAMUSCULAR | Status: AC
  Administered 2022-08-24: 0.5 mg via INTRAVENOUS
  Filled 2022-08-24: qty 1

## 2022-08-24 MED ORDER — IOHEXOL 300 MG/ML  SOLN
75.0000 mL | Freq: Once | INTRAMUSCULAR | Status: AC | PRN
  Administered 2022-08-24: 75 mL via INTRAVENOUS

## 2022-08-24 MED ORDER — HALOPERIDOL LACTATE 5 MG/ML IJ SOLN
5.0000 mg | Freq: Once | INTRAMUSCULAR | Status: AC
  Administered 2022-08-24: 5 mg via INTRAMUSCULAR
  Filled 2022-08-24: qty 1

## 2022-08-24 MED ORDER — RIVAROXABAN 15 MG PO TABS: 15.0000 mg | ORAL_TABLET | Freq: Two times a day (BID) | ORAL | Status: DC

## 2022-08-24 MED ORDER — DOCUSATE SODIUM 100 MG PO CAPS
100.0000 mg | ORAL_CAPSULE | Freq: Two times a day (BID) | ORAL | Status: DC
  Administered 2022-08-24 – 2022-08-25 (×3): 100 mg via ORAL
  Filled 2022-08-24 (×3): qty 1

## 2022-08-24 MED ORDER — ONDANSETRON HCL 4 MG/2ML IJ SOLN: 4.0000 mg | Freq: Four times a day (QID) | INTRAMUSCULAR | Status: DC | PRN

## 2022-08-24 MED ORDER — MOMETASONE FURO-FORMOTEROL FUM 200-5 MCG/ACT IN AERO
2.0000 | INHALATION_SPRAY | Freq: Two times a day (BID) | RESPIRATORY_TRACT | Status: DC
  Administered 2022-08-24 – 2022-08-25 (×2): 2 via RESPIRATORY_TRACT
  Filled 2022-08-24: qty 8.8

## 2022-08-24 MED ORDER — PANTOPRAZOLE SODIUM 40 MG PO TBEC
40.0000 mg | DELAYED_RELEASE_TABLET | Freq: Two times a day (BID) | ORAL | Status: DC
  Administered 2022-08-24 – 2022-08-25 (×3): 40 mg via ORAL
  Filled 2022-08-24 (×3): qty 1

## 2022-08-24 MED ORDER — ACETAMINOPHEN 325 MG PO TABS
650.0000 mg | ORAL_TABLET | Freq: Four times a day (QID) | ORAL | Status: DC | PRN
  Administered 2022-08-24 – 2022-08-25 (×2): 650 mg via ORAL
  Filled 2022-08-24 (×2): qty 2

## 2022-08-24 MED ORDER — ENTECAVIR 0.5 MG PO TABS: 0.5000 mg | ORAL_TABLET | ORAL | Status: DC

## 2022-08-24 MED ORDER — GABAPENTIN 300 MG PO CAPS
300.0000 mg | ORAL_CAPSULE | Freq: Three times a day (TID) | ORAL | Status: DC
  Administered 2022-08-24 – 2022-08-25 (×4): 300 mg via ORAL
  Filled 2022-08-24 (×4): qty 1

## 2022-08-24 MED ORDER — ALPRAZOLAM 0.5 MG PO TABS
1.0000 mg | ORAL_TABLET | Freq: Three times a day (TID) | ORAL | Status: DC | PRN
  Administered 2022-08-24 – 2022-08-25 (×2): 1 mg via ORAL
  Filled 2022-08-24 (×2): qty 2

## 2022-08-24 MED ORDER — POTASSIUM CHLORIDE 20 MEQ PO PACK
40.0000 meq | PACK | Freq: Once | ORAL | Status: AC
  Administered 2022-08-24: 40 meq via ORAL
  Filled 2022-08-24: qty 2

## 2022-08-24 MED ORDER — SODIUM CHLORIDE 0.9% FLUSH: 3.0000 mL | INTRAVENOUS | Status: DC | PRN

## 2022-08-24 MED ORDER — ONDANSETRON HCL 4 MG PO TABS: 4.0000 mg | ORAL_TABLET | Freq: Four times a day (QID) | ORAL | Status: DC | PRN

## 2022-08-24 MED ORDER — CARVEDILOL 12.5 MG PO TABS
12.5000 mg | ORAL_TABLET | Freq: Two times a day (BID) | ORAL | Status: DC
  Administered 2022-08-24 – 2022-08-25 (×3): 12.5 mg via ORAL
  Filled 2022-08-24: qty 1
  Filled 2022-08-24: qty 4
  Filled 2022-08-24: qty 1
  Filled 2022-08-24: qty 4
  Filled 2022-08-24 (×2): qty 1

## 2022-08-24 MED ORDER — ALPRAZOLAM 0.5 MG PO TABS: 1.0000 mg | ORAL_TABLET | Freq: Three times a day (TID) | ORAL | Status: DC

## 2022-08-24 MED ORDER — BUPROPION HCL ER (XL) 150 MG PO TB24
150.0000 mg | ORAL_TABLET | Freq: Every morning | ORAL | Status: DC
  Administered 2022-08-24 – 2022-08-25 (×2): 150 mg via ORAL
  Filled 2022-08-24 (×2): qty 1

## 2022-08-24 MED ORDER — RIVAROXABAN 15 MG PO TABS
15.0000 mg | ORAL_TABLET | Freq: Two times a day (BID) | ORAL | Status: DC
  Administered 2022-08-25: 15 mg via ORAL
  Filled 2022-08-24 (×2): qty 1

## 2022-08-24 MED ORDER — ACETAMINOPHEN 650 MG RE SUPP: 650.0000 mg | Freq: Four times a day (QID) | RECTAL | Status: DC | PRN

## 2022-08-24 NOTE — ED Notes (Signed)
Pt ambulatory to bathroom utilizing personal cane. Handheld assist per pt request. Steady gait

## 2022-08-24 NOTE — Patient Instructions (Addendum)
If you are having difficulty stepping in/out of the tub/shower, I recommend purchasing a Tub Bench.   Walk towards the tub bench, sit on the edge then swing legs into the tub.   - It will be cheaper to purchase through [employer] versus going through a medical supply store.       To assist with toileting hygiene:  Using toilet tongs may help to provide a better reach when wiping. Recommend using wet wipes for easier clean up.    Or    Placing a bedside commode over the toilet could also help you be able to lean right/left to clean up.      All these adaptive equipments can be purchased on [employer]. Insurance may cover all or a portion of the cost for a bedside commode.

## 2022-08-24 NOTE — ED Notes (Signed)
ED TO INPATIENT HANDOFF REPORT  ED Nurse Name and Phone #: Crist Infante, RN [phone]  S Name/Age/Gender [name] 67 y.o. female Room/Bed: WA17/WA17  Code Status   Code Status: Full Code  Home/SNF/Other Home Patient oriented to: self and situation Is this baseline? No   Triage Complete: Triage complete  Chief Complaint Acute metabolic encephalopathy [G93.41]  Triage Note Patient BIB EMS for multiple complaints.  Reports she is having confusion per daughter, knee pain, and pain around "my belly button."  Has hx of anxiety.  Continues to add more complaints as triage completed.  Was seen for similar symptoms on 7/25   Allergies Allergies  Allergen Reactions   Heparin Hives    Pt states she cannot take because she will break out in hives   Amitriptyline Hcl Other (See Comments)     Causes her to be very "disoriented".   Azithromycin Rash   Fentanyl Other (See Comments)    Sees things Pt stated had hallucinations "d/t taking high dosage, but can tolerate low dosage"    Cymbalta [Duloxetine Hcl] Other (See Comments)    Headaches    Neomycin-Bacitracin Zn-Polymyx Rash    Level of Care/Admitting Diagnosis ED Disposition     ED Disposition  Admit   Condition  --   Comment  Hospital [facility] [[id number]]  Level of Care: Med-Surg [16]  May admit patient to [facility] or [facility] if equivalent level of care is available:: No  Covid Evaluation: Asymptomatic - no recent exposure (last 10 days) testing not required  Diagnosis: Acute metabolic encephalopathy [[id number]]  Admitting Physician: Tereasa Coop [[id number]]  Attending Physician: Tereasa Coop [[id number]]  Certification:: I certify this patient will need inpatient services for at least 2 midnights  Estimated Length of Stay: 5          B Medical/Surgery History Past Medical History:  Diagnosis Date   Agoraphobia with panic attacks    Altered mental state 10/26/2013   Ankle fracture,  left 11/27/2010   S/p ORIF 11/2    Anxiety    Arthritis    "knees; lower back" (10/30/2013)   Bipolar disorder (HCC)    Cavitary pneumonia    Cirrhosis (HCC)    CKD (chronic kidney disease), stage III (HCC)    COPD (chronic obstructive pulmonary disease) (HCC)    Depression    DVT (deep venous thrombosis) (HCC) 09/2013   RLE   GERD (gastroesophageal reflux disease)    Heart murmur    Hepatitis B    History of blood transfusion    "due to excessive blood loss before hysterectomy"   History of urinary tract infection    Hypertension    currently on no medication    Kidney stones    Pneumonia    "4 times in the past year" (10/30/2013)   Pulmonary emboli (HCC) 09/2013   Reactive airway disease 08/24/2022   Shortness of breath dyspnea    increased exertion;exercise   Urinary frequency    Urinary incontinence    Past Surgical History:  Procedure Laterality Date   ABDOMINAL HYSTERECTOMY     ANKLE FRACTURE SURGERY Left    BIOPSY  06/26/2022   Procedure: BIOPSY;  Surgeon: Vida Rigger, MD;  Location: [facility];  Service: Gastroenterology;;   CARPAL TUNNEL RELEASE Left    DILATION AND CURETTAGE OF UTERUS  X 2   ESOPHAGOGASTRODUODENOSCOPY (EGD) WITH PROPOFOL N/A 06/26/2022   Procedure: ESOPHAGOGASTRODUODENOSCOPY (EGD) WITH PROPOFOL;  Surgeon: Vida Rigger, MD;  Location: [facility];  Service: Gastroenterology;  Laterality: N/A;   FRACTURE SURGERY     HAND SURGERY     1980's/left hand    INSERTION OF MESH N/A 12/10/2014   Procedure: INSERTION OF MESH;  Surgeon: Karie Soda, MD;  Location: [facility];  Service: General;  Laterality: N/A;   LAPAROSCOPIC ASSISTED VENTRAL HERNIA REPAIR N/A 12/10/2014   Procedure: LAPAROSCOPIC ASSISTED REPAIR OF INCARCERATED UMBILICAL HERNIA ;  Surgeon: Karie Soda, MD;  Location: [facility];  Service: General;  Laterality: N/A;   TUBAL LIGATION       A IV Location/Drains/Wounds Patient Lines/Drains/Airways Status     Active Line/Drains/Airways     Name  Placement date Placement time Site Days   Peripheral IV 08/24/22 20 G 1" Anterior;Left Forearm 08/24/22  0206  Forearm  less than 1            Intake/Output Last 24 hours No intake or output data in the 24 hours ending 08/24/22 0736  Labs/Imaging Results for orders placed or performed during the hospital encounter of 08/23/22 (from the past 48 hour(s))  Comprehensive metabolic panel     Status: Abnormal   Collection Time: 08/24/22  2:10 AM  Result Value Ref Range   Sodium 134 (L) 135 - 145 mmol/L   Potassium 3.3 (L) 3.5 - 5.1 mmol/L   Chloride 99 98 - 111 mmol/L   CO2 22 22 - 32 mmol/L   Glucose, Bld 97 70 - 99 mg/dL    Comment: Glucose reference range applies only to samples taken after fasting for at least 8 hours.   BUN 16 8 - 23 mg/dL   Creatinine, Ser 1.61 (H) 0.44 - 1.00 mg/dL   Calcium 9.8 8.9 - 09.6 mg/dL   Total Protein 8.4 (H) 6.5 - 8.1 g/dL   Albumin 4.2 3.5 - 5.0 g/dL   AST 51 (H) 15 - 41 U/L   ALT 29 0 - 44 U/L   Alkaline Phosphatase 100 38 - 126 U/L   Total Bilirubin 0.8 0.3 - 1.2 mg/dL   GFR, Estimated 44 (L) >60 mL/min    Comment: (NOTE) Calculated using the CKD-EPI Creatinine Equation (2021)    Anion gap 13 5 - 15    Comment: Performed at [facility], [address], [place], [place] [address]  Ethanol     Status: None   Collection Time: 08/24/22  2:10 AM  Result Value Ref Range   Alcohol, Ethyl (B) <10 <10 mg/dL    Comment: (NOTE) Lowest detectable limit for serum alcohol is 10 mg/dL.  For medical purposes only. Performed at [facility], [address] [place], [place] [address]   CBC with Differential     Status: Abnormal   Collection Time: 08/24/22  2:10 AM  Result Value Ref Range   WBC 9.4 4.0 - 10.5 K/uL   RBC 4.64 3.87 - 5.11 MIL/uL   Hemoglobin 12.9 12.0 - 15.0 g/dL   HCT 14.7 82.9 - 56.2 %   MCV 87.1 80.0 - 100.0 fL   MCH 27.8 26.0 - 34.0 pg   MCHC 31.9 30.0 - 36.0 g/dL   RDW 13.0 (H) 86.5 -  15.5 %   Platelets 167 150 - 400 K/uL   nRBC 0.0 0.0 - 0.2 %   Neutrophils Relative % 77 %   Neutro Abs 7.2 1.7 - 7.7 K/uL   Lymphocytes Relative 17 %   Lymphs Abs 1.6 0.7 - 4.0 K/uL   Monocytes Relative  6 %   Monocytes Absolute 0.6 0.1 - 1.0 K/uL   Eosinophils Relative 0 %   Eosinophils Absolute 0.0 0.0 - 0.5 K/uL   Basophils Relative 0 %   Basophils Absolute 0.0 0.0 - 0.1 K/uL   Immature Granulocytes 0 %   Abs Immature Granulocytes 0.03 0.00 - 0.07 K/uL    Comment: Performed at [facility], [address] [place], [place] [address]  Ammonia     Status: None   Collection Time: 08/24/22  2:10 AM  Result Value Ref Range   Ammonia 20 9 - 35 umol/L    Comment: Performed at [facility], [address], [place], [place] [address]  Blood gas, venous     Status: None   Collection Time: 08/24/22  2:10 AM  Result Value Ref Range   pH, Ven 7.36 7.25 - 7.43   pCO2, Ven 44 44 - 60 mmHg   pO2, Ven 36 32 - 45 mmHg   Bicarbonate 24.9 20.0 - 28.0 mmol/L   Acid-base deficit 0.8 0.0 - 2.0 mmol/L   O2 Saturation 60.6 %   Patient temperature 37.0     Comment: Performed at [facility], [address], [place], [place] [address]  Urinalysis, w/ Reflex to Culture (Infection Suspected) -Urine, Clean Catch     Status: Abnormal   Collection Time: 08/24/22  2:13 AM  Result Value Ref Range   Specimen Source URINE, CLEAN CATCH    Color, Urine STRAW (A) YELLOW   APPearance CLEAR CLEAR   Specific Gravity, Urine 1.004 (L) 1.005 - 1.030   pH 7.0 5.0 - 8.0   Glucose, UA NEGATIVE NEGATIVE mg/dL   Hgb urine dipstick SMALL (A) NEGATIVE   Bilirubin Urine NEGATIVE NEGATIVE   Ketones, ur NEGATIVE NEGATIVE mg/dL   Protein, ur 30 (A) NEGATIVE mg/dL   Nitrite NEGATIVE NEGATIVE   Leukocytes,Ua NEGATIVE NEGATIVE   RBC / HPF 0-5 0 - 5 RBC/hpf   WBC, UA 0-5 0 - 5 WBC/hpf    Comment:        Reflex urine culture not performed if WBC <=10, OR if Squamous  epithelial cells >5. If Squamous epithelial cells >5 suggest recollection.    Bacteria, UA NONE SEEN NONE SEEN   Squamous Epithelial / HPF 0-5 0 - 5 /HPF    Comment: Performed at [facility], [address], [place], [place] [address]  Urine rapid drug screen (hosp performed)     Status: Abnormal   Collection Time: 08/24/22  2:13 AM  Result Value Ref Range   Opiates NONE DETECTED NONE DETECTED   Cocaine NONE DETECTED NONE DETECTED   Benzodiazepines POSITIVE (A) NONE DETECTED   Amphetamines NONE DETECTED NONE DETECTED   Tetrahydrocannabinol NONE DETECTED NONE DETECTED   Barbiturates NONE DETECTED NONE DETECTED    Comment: (NOTE) DRUG SCREEN FOR MEDICAL PURPOSES ONLY.  IF CONFIRMATION IS NEEDED FOR ANY PURPOSE, NOTIFY LAB WITHIN 5 DAYS.  LOWEST DETECTABLE LIMITS FOR URINE DRUG SCREEN Drug Class                     Cutoff (ng/mL) Amphetamine and metabolites    1000 Barbiturate and metabolites    200 Benzodiazepine                 200 Opiates and metabolites        300 Cocaine and metabolites        300 THC  50 Performed at [facility], [address], [place], [place] [address]    CT ABDOMEN PELVIS W CONTRAST  Result Date: 08/24/2022 CLINICAL DATA:  Abdominal pain.  History of hematuria and cirrhosis. EXAM: CT ABDOMEN AND PELVIS WITH CONTRAST TECHNIQUE: Multidetector CT imaging of the abdomen and pelvis was performed using the standard protocol following bolus administration of intravenous contrast. RADIATION DOSE REDUCTION: This exam was performed according to the departmental dose-optimization program which includes automated exposure control, adjustment of the mA and/or kV according to patient size and/or use of iterative reconstruction technique. CONTRAST:  75mL OMNIPAQUE IOHEXOL 300 MG/ML  SOLN COMPARISON:  06/26/2022 FINDINGS: Lower chest: Scar versus subsegmental atelectasis noted in the lung bases. No pleural  fluid or airspace consolidation. Hepatobiliary: The liver has a cirrhotic morphology with a diffuse nodular shrunken appearance. No focal liver abnormality noted. Gallbladder appears normal. No bile duct dilatation. Pancreas: Unremarkable. No pancreatic ductal dilatation or surrounding inflammatory changes. Spleen: Spleen measures 14 cm in length.  No focal splenic lesion. Adrenals/Urinary Tract: Normal adrenal glands. Bilateral nonobstructing renal calculi. The largest stone is in the upper pole of left kidney measuring 5 mm, image 38/3. Cyst off the upper pole of the right kidney is again seen measuring 5.4 cm. No follow-up imaging recommended. No signs of hydronephrosis or hydroureter bilaterally. The urinary bladder appears within normal limits. Stomach/Bowel: Stomach is normal. The appendix is within normal limits. No pathologic dilatation of the large or small bowel loops. No bowel wall thickening or inflammation identified Vascular/Lymphatic: Aortic atherosclerosis. No aneurysm. No abdominopelvic adenopathy. Reproductive: Status post hysterectomy. No adnexal masses. Other: No free fluid or fluid collections. No signs of pneumoperitoneum. No ventral abdominal wall hernia identified. Musculoskeletal: No acute or significant osseous findings. Multilevel lumbar spondylosis scratch multilevel lumbar degenerative disc disease. 6 mm anterolisthesis of L4 on L5. No acute or suspicious osseous lesions. IMPRESSION: 1. No acute findings in the abdomen or pelvis. No specific findings to explain the patient's history of abdominal pain. 2. Cirrhosis with splenomegaly. No ascites. 3. Bilateral nonobstructing renal calculi. 4. Aortic Atherosclerosis (ICD10-I70.0). Electronically Signed   By: Signa Kell M.D.   On: 08/24/2022 05:28   CT Head Wo Contrast  Result Date: 08/24/2022 CLINICAL DATA:  Confusion EXAM: CT HEAD WITHOUT CONTRAST TECHNIQUE: Contiguous axial images were obtained from the base of the skull through the  vertex without intravenous contrast. RADIATION DOSE REDUCTION: This exam was performed according to the departmental dose-optimization program which includes automated exposure control, adjustment of the mA and/or kV according to patient size and/or use of iterative reconstruction technique. COMPARISON:  08/17/2022 FINDINGS: Brain: No evidence of acute infarction, hemorrhage, hydrocephalus, extra-axial collection or mass lesion/mass effect. Mild low-density in the cerebral white matter, likely chronic small vessel ischemia. Vascular: No hyperdense vessel or unexpected calcification. Skull: Normal. Negative for fracture or focal lesion. Sinuses/Orbits: No acute finding. IMPRESSION: No new or acute finding. Electronically Signed   By: Tiburcio Pea M.D.   On: 08/24/2022 05:09   DG Chest Port 1 View  Result Date: 08/24/2022 CLINICAL DATA:  Altered mental status. EXAM: PORTABLE CHEST 1 VIEW COMPARISON:  August 17, 2022 FINDINGS: The study is limited secondary to patient rotation. The heart size and mediastinal contours are within normal limits. Mild left infrahilar atelectasis is seen. No pleural effusion or pneumothorax is identified. Multilevel degenerative changes seen throughout the thoracic spine. IMPRESSION: Mild left infrahilar atelectasis. Electronically Signed   By: Aram Candela M.D.   On: 08/24/2022 01:50  Pending Labs Unresulted Labs (From admission, onward)     Start     Ordered   08/25/22 0500  Comprehensive metabolic panel  Tomorrow morning,   R        08/24/22 0626   08/25/22 0500  CBC  Tomorrow morning,   R        08/24/22 0626   08/24/22 0646  Magnesium  Add-on,   AD        08/24/22 0645   08/24/22 0646  Osmolality  Add-on,   AD        08/24/22 0645   08/24/22 0646  Sodium, urine, random  Once,   R       Question:  Release to patient  Answer:  Immediate   08/24/22 0645   08/24/22 0646  Osmolality, urine  Once,   R        08/24/22 0645            Vitals/Pain Today's  Vitals   08/24/22 0245 08/24/22 0330 08/24/22 0626 08/24/22 0736  BP: (!) 154/97 128/83    Pulse: 83 68    Resp: (!) 25 (!) 22    Temp: 97.6 F (36.4 C)  97.6 F (36.4 C)   TempSrc:      SpO2: 93% 100%    Weight:      Height:      PainSc:    0-No pain    Isolation Precautions No active isolations  Medications Medications  entecavir (BARACLUDE) tablet 0.5 mg (has no administration in time range)  carvedilol (COREG) tablet 12.5 mg (has no administration in time range)  spironolactone (ALDACTONE) tablet 25 mg (has no administration in time range)  docusate sodium (COLACE) capsule 100 mg (has no administration in time range)  pantoprazole (PROTONIX) EC tablet 40 mg (has no administration in time range)  Rivaroxaban (XARELTO) tablet 15 mg (has no administration in time range)  rivaroxaban (XARELTO) tablet 20 mg (has no administration in time range)  buPROPion (WELLBUTRIN XL) 24 hr tablet 150 mg (has no administration in time range)  doxepin (SINEQUAN) capsule 100 mg (has no administration in time range)  albuterol (VENTOLIN HFA) 108 (90 Base) MCG/ACT inhaler 2 puff (has no administration in time range)  mometasone-formoterol (DULERA) 200-5 MCG/ACT inhaler 2 puff (has no administration in time range)  montelukast (SINGULAIR) tablet 10 mg (has no administration in time range)  sodium chloride flush (NS) 0.9 % injection 3 mL (has no administration in time range)  sodium chloride flush (NS) 0.9 % injection 3 mL (has no administration in time range)  0.9 %  sodium chloride infusion (has no administration in time range)  acetaminophen (TYLENOL) tablet 650 mg (has no administration in time range)    Or  acetaminophen (TYLENOL) suppository 650 mg (has no administration in time range)  ondansetron (ZOFRAN) tablet 4 mg (has no administration in time range)    Or  ondansetron (ZOFRAN) injection 4 mg (has no administration in time range)  hydrALAZINE (APRESOLINE) injection 10 mg (has no  administration in time range)  ALPRAZolam (XANAX) tablet 1 mg (has no administration in time range)  haloperidol lactate (HALDOL) injection 5 mg (5 mg Intramuscular Given 08/24/22 0221)  iohexol (OMNIPAQUE) 300 MG/ML solution 75 mL (75 mLs Intravenous Contrast Given 08/24/22 0436)  LORazepam (ATIVAN) injection 0.5 mg (0.5 mg Intravenous Given 08/24/22 0637)  potassium chloride (KLOR-CON) packet 40 mEq (40 mEq Oral Given 08/24/22 0641)    Mobility walks with device  Focused Assessments Neuro Assessment Handoff:  Swallow screen pass?  N/A Cardiac Rhythm: Normal sinus rhythm       Neuro Assessment:   Neuro Checks:      Has TPA been given? No If patient is a Neuro Trauma and patient is going to OR before floor call report to 4N Charge nurse: [phone] or [phone]   R Recommendations: See Admitting Provider Note  Report given to:   Additional Notes:

## 2022-08-24 NOTE — H&P (Addendum)
History and Physical    [name] HQI:[id number] DOB: [birth year] DOA: 08/23/2022  PCP: Tracey Harries, MD   Patient coming from: Home   Chief Complaint:  Chief Complaint  Patient presents with   Altered Mental Status    HPI:  [name] is a 67 y.o. female with medical history significant of significant of chronic depression, generalized anxiety disorder, bipolar disorder, CKD stage IIIb, COPD, hypertension, hepatitis B associated cirrhosis, iron deficiency anemia and history of VTE on Xarelto presented to emergency department with complaining of altered mentation.  Patient called EMS as she was not feeling fine at home.  Patient has nonspecific lot of complaints.  She has pain around her bellybutton.  Denies any nausea, vomiting, change of appetite and weight.  Reported she is unable to clean herself after bathroom and crying that she needed help with that.  She is homeless and requesting for placement.  She has multiple bouts of crying spell and complaining about bilateral lower extremities pain more on the right side. Patient denies any chest pain, shortness of breath, palpitation, headache, blurry vision, focal weakness, facial droop, nausea, vomiting, diarrhea, change of appetite change of weight. Patient is a very poor historian.  Chart review recent hospital admission 08/09/2022 for complaining of right leg pain and swelling.  Workup showed showed acute development of PE in the setting of medication noncompliance with Xarelto.  She is being started on Xarelto and discharged on the same day from hospital.  Also patient was prescribed hydrocodone as needed as needed and advised not to take benzodiazepine and advised patient daughter to monitor her not to take visit as it been as well.  ED Course:  At presentation to ED patient's blood pressure elevated 182/9017, respiratory rate 20, heart rate 78 and O2 sat 97% room air.  CMP showed sodium 134, potassium 3.3, chloride 99, bicarb 22,  blood glucose 97, BUN 16, creatinine 1.33, elevated AST 51, GFR 44 (around patient baseline and anion gap 13. CBC showed WBC 9.4, hemoglobin 12.9, hematocrit 40 and platelet 167. Ethanol less than 10 and ammonia 20 WNL. VBG unremarkable. UA strappings, no specific gravity 1.004, small hemoglobin, protein 30.  No evidence of UTI. UDS positive with benzodiazepine.  Chest x-ray unremarkable with mild left perihilar atelectasis.  CT head no evidence of acute infraction, hemorrhage hydrocephalus.  Mid to low density cerebral apartment likely chronic small vessel ischemia.  CT abdomen pelvis no acute finding.  Cirrhosis splenomegaly.  No ascites.  Bilateral nonobstructing renal calculi.  Aortic atherosclerosis.  In the ED patient suddenly become very agitated and required Haldol 5 mg once.  Hospitalist team has been consulted to identify the cause of acute metabolic encephalopathy and further evaluation.   Review of Systems:  Review of Systems  Constitutional:  Negative for chills, fever, malaise/fatigue and weight loss.  HENT:  Negative for hearing loss.   Eyes:  Negative for blurred vision and double vision.  Respiratory:  Negative for cough and sputum production.   Cardiovascular:  Negative for chest pain, palpitations, orthopnea, claudication, leg swelling and PND.  Gastrointestinal:  Positive for abdominal pain. Negative for blood in stool, constipation, diarrhea, heartburn, nausea and vomiting.  Genitourinary:  Negative for dysuria, frequency, hematuria and urgency.  Musculoskeletal:  Negative for back pain, joint pain, myalgias and neck pain.  Skin:  Negative for itching and rash.  Neurological:  Negative for dizziness, tingling, tremors, sensory change, speech change, focal weakness, seizures, loss of consciousness, weakness and headaches.  Psychiatric/Behavioral:  The patient is not nervous/anxious.     Past Medical History:  Diagnosis Date   Agoraphobia with panic attacks     Altered mental state 10/26/2013   Ankle fracture, left 11/27/2010   S/p ORIF 11/2    Anxiety    Arthritis    "knees; lower back" (10/30/2013)   Bipolar disorder (HCC)    Cavitary pneumonia    Cirrhosis (HCC)    CKD (chronic kidney disease), stage III (HCC)    COPD (chronic obstructive pulmonary disease) (HCC)    Depression    DVT (deep venous thrombosis) (HCC) 09/2013   RLE   GERD (gastroesophageal reflux disease)    Heart murmur    Hepatitis B    History of blood transfusion    "due to excessive blood loss before hysterectomy"   History of urinary tract infection    Hypertension    currently on no medication    Kidney stones    Pneumonia    "4 times in the past year" (10/30/2013)   Pulmonary emboli (HCC) 09/2013   Reactive airway disease 08/24/2022   Shortness of breath dyspnea    increased exertion;exercise   Urinary frequency    Urinary incontinence     Past Surgical History:  Procedure Laterality Date   ABDOMINAL HYSTERECTOMY     ANKLE FRACTURE SURGERY Left    BIOPSY  06/26/2022   Procedure: BIOPSY;  Surgeon: Vida Rigger, MD;  Location: [facility];  Service: Gastroenterology;;   CARPAL TUNNEL RELEASE Left    DILATION AND CURETTAGE OF UTERUS  X 2   ESOPHAGOGASTRODUODENOSCOPY (EGD) WITH PROPOFOL N/A 06/26/2022   Procedure: ESOPHAGOGASTRODUODENOSCOPY (EGD) WITH PROPOFOL;  Surgeon: Vida Rigger, MD;  Location: [facility];  Service: Gastroenterology;  Laterality: N/A;   FRACTURE SURGERY     HAND SURGERY     1980's/left hand    INSERTION OF MESH N/A 12/10/2014   Procedure: INSERTION OF MESH;  Surgeon: Karie Soda, MD;  Location: [facility];  Service: General;  Laterality: N/A;   LAPAROSCOPIC ASSISTED VENTRAL HERNIA REPAIR N/A 12/10/2014   Procedure: LAPAROSCOPIC ASSISTED REPAIR OF INCARCERATED UMBILICAL HERNIA ;  Surgeon: Karie Soda, MD;  Location: [facility];  Service: General;  Laterality: N/A;   TUBAL LIGATION       reports that she quit smoking about 8 years ago. Her smoking  use included cigarettes. She started smoking about 43 years ago. She has a 17.5 pack-year smoking history. She has never used smokeless tobacco. She reports that she does not drink alcohol and does not use drugs.  Allergies  Allergen Reactions   Heparin Hives    Pt states she cannot take because she will break out in hives   Amitriptyline Hcl Other (See Comments)     Causes her to be very "disoriented".   Azithromycin Rash   Fentanyl Other (See Comments)    Sees things Pt stated had hallucinations "d/t taking high dosage, but can tolerate low dosage"    Cymbalta [Duloxetine Hcl] Other (See Comments)    Headaches    Neomycin-Bacitracin Zn-Polymyx Rash    Family History  Problem Relation Age of Onset   Stroke Mother    Lung cancer Father     Prior to Admission medications   Medication Sig Start Date End Date Taking? Authorizing Provider  albuterol (PROAIR HFA) 108 (90 Base) MCG/ACT inhaler INHALE 2 PUFFS INTO THE LUNGS EVERY 4 HOURS AS NEEDED FOR WHEEZING Patient taking differently: Inhale 2 puffs into the lungs every 4 (four) hours  as needed for shortness of breath or wheezing. 12/04/18   Oretha Milch, MD  albuterol (PROVENTIL) (2.5 MG/3ML) 0.083% nebulizer solution Take 3 mLs (2.5 mg total) by nebulization every 4 (four) hours as needed for wheezing or shortness of breath. 12/04/18   Oretha Milch, MD  ALPRAZolam Prudy Feeler) 1 MG tablet Take 1 tablet (1 mg total) by mouth 3 (three) times daily. 06/27/22   Shalhoub, Deno Lunger, MD  baclofen (LIORESAL) 20 MG tablet Take 20 mg by mouth at bedtime. 04/22/20   [provider]  budesonide-formoterol (SYMBICORT) 160-4.5 MCG/ACT inhaler Inhale 2 puffs into the lungs 2 (two) times daily. 12/04/18 06/24/22  Oretha Milch, MD  buPROPion (WELLBUTRIN XL) 150 MG 24 hr tablet Take 150 mg by mouth every morning.    [provider]  carvedilol (COREG) 12.5 MG tablet Take 1 tablet (12.5 mg total) by mouth 2 (two) times daily with a meal.  06/27/22   Shalhoub, Deno Lunger, MD  Cholecalciferol (VITAMIN D3) 50 MCG (2000 UT) TABS Take 2,000 Units by mouth daily.    [provider]  clotrimazole-betamethasone (LOTRISONE) cream Apply to affected area 2 times daily prn Patient taking differently: Apply 1 application  topically 2 (two) times daily as needed (rash). Apply to affected area 2 times daily prn 04/29/20   Cathren Laine, MD  docusate sodium (COLACE) 100 MG capsule Take 100 mg by mouth 2 (two) times daily.    [provider]  doxepin (SINEQUAN) 100 MG capsule Take 100 mg by mouth at bedtime. 06/01/22   [provider]  entecavir (BARACLUDE) 0.5 MG tablet Take 0.5 mg by mouth every other day. 11/25/19   [provider]  famotidine (PEPCID) 20 MG tablet Take 20 mg by mouth 2 (two) times daily. 10/28/19   [provider]  gabapentin (NEURONTIN) 300 MG capsule Take 300 mg by mouth 3 (three) times daily. 07/13/20   [provider]  levocetirizine (XYZAL) 5 MG tablet Take 5 mg by mouth every evening. 12/02/19   [provider]  montelukast (SINGULAIR) 10 MG tablet Take 10 mg by mouth at bedtime.    [provider]  ondansetron (ZOFRAN ODT) 4 MG disintegrating tablet Take 1 tablet (4 mg total) by mouth every 8 (eight) hours as needed for nausea or vomiting. 07/18/20   Sabas Sous, MD  pantoprazole (PROTONIX) 40 MG tablet Take 40 mg by mouth 2 (two) times daily before a meal. 12/23/13   [provider]  Rivaroxaban (XARELTO) 15 MG TABS tablet Take 1 tablet (15 mg total) by mouth daily with supper for 1 dose. 08/10/22 08/11/22  Tegeler, Canary Brim, MD  RIVAROXABAN Carlena Hurl) VTE STARTER PACK (15 & 20 MG) Follow package directions: Take one 15mg  tablet by mouth twice a day. On day 22, switch to one 20mg  tablet once a day. Take with food. 08/09/22   Tyrone Nine, MD  spironolactone (ALDACTONE) 25 MG tablet Take 1 tablet (25 mg total) by mouth daily. 06/27/22   Shalhoub, Deno Lunger, MD  sucralfate (CARAFATE) 1 g tablet Take 1 g by mouth 4 (four) times daily. 07/22/19   [provider]  valACYclovir (VALTREX) 500 MG tablet Take 500 mg by mouth daily as needed (breakout). 08/21/17   [provider]  enoxaparin (LOVENOX) 120 MG/0.8ML SOLN Inject 0.8 mLs (120 mg total) into the skin every 12 (twelve) hours. 11/30/10 04/16/11  Daryel Gerald, MD  loratadine (CLARITIN) 10 MG tablet Take 10 mg by  mouth daily.    04/16/11  [provider]     Physical Exam: Vitals:   08/23/22 2300 08/24/22 0245 08/24/22 0330 08/24/22 0626  BP:  (!) 154/97 128/83   Pulse:  83 68   Resp:  (!) 25 (!) 22   Temp:  97.6 F (36.4 C)  97.6 F (36.4 C)  TempSrc:      SpO2:  93% 100%   Weight: 106.6 kg     Height: 5\' 4"  (1.626 m)       Physical Exam Constitutional:      General: She is not in acute distress.    Appearance: She is obese. She is not ill-appearing.  HENT:     Head: Normocephalic and atraumatic.     Nose: Nose normal.     Mouth/Throat:     Mouth: Mucous membranes are moist.  Eyes:     Conjunctiva/sclera: Conjunctivae normal.  Cardiovascular:     Rate and Rhythm: Normal rate and regular rhythm.     Pulses: Normal pulses.     Heart sounds: Normal heart sounds.  Pulmonary:     Effort: Pulmonary effort is normal.     Breath sounds: Normal breath sounds.  Abdominal:     General: Bowel sounds are normal. There is no distension.     Palpations: There is no mass.     Tenderness: There is no abdominal tenderness. There is no guarding or rebound.     Hernia: No hernia is present.  Musculoskeletal:        General: No swelling, tenderness, deformity or signs of injury.     Right lower leg: No edema.     Left lower leg: No edema.  Skin:    General: Skin is warm.     Capillary Refill: Capillary refill takes less than 2 seconds.  Neurological:     Mental Status: She is alert and oriented to person, place, and time.     Cranial Nerves: No  cranial nerve deficit.     Sensory: No sensory deficit.     Motor: No weakness.     Coordination: Coordination normal.  Psychiatric:     Comments: Labile mood.  Patient is crying sporadically.  Poor judgment.  Agitated and unpredictable behavior.      Labs on Admission: I have personally reviewed following labs and imaging studies  CBC: Recent Labs  Lab 08/24/22 0210  WBC 9.4  NEUTROABS 7.2  HGB 12.9  HCT 40.4  MCV 87.1  PLT 167   Basic Metabolic Panel: Recent Labs  Lab 08/24/22 0210  NA 134*  K 3.3*  CL 99  CO2 22  GLUCOSE 97  BUN 16  CREATININE 1.33*  CALCIUM 9.8   GFR: Estimated Creatinine Clearance: 50.3 mL/min (A) (by C-G formula based on SCr of 1.33 mg/dL (H)). Liver Function Tests: Recent Labs  Lab 08/24/22 0210  AST 51*  ALT 29  ALKPHOS 100  BILITOT 0.8  PROT 8.4*  ALBUMIN 4.2   No results for input(s): "LIPASE", "AMYLASE" in the last 168 hours. Recent Labs  Lab 08/24/22 0210  AMMONIA 20   Coagulation Profile: No results for input(s): "INR", "PROTIME" in the last 168 hours. Cardiac Enzymes: No results for input(s): "CKTOTAL", "CKMB", "CKMBINDEX", "TROPONINI", "TROPONINIHS" in the last 168 hours.  BNP (last 3 results) Recent Labs    06/25/22 1541 08/09/22 0019  BNP 32.0 22.3   HbA1C: No results for input(s): "HGBA1C" in the last 72 hours. CBG: No results for input(s): "  GLUCAP" in the last 168 hours. Lipid Profile: No results for input(s): "CHOL", "HDL", "LDLCALC", "TRIG", "CHOLHDL", "LDLDIRECT" in the last 72 hours. Thyroid Function Tests: No results for input(s): "TSH", "T4TOTAL", "FREET4", "T3FREE", "THYROIDAB" in the last 72 hours. Anemia Panel: No results for input(s): "VITAMINB12", "FOLATE", "FERRITIN", "TIBC", "IRON", "RETICCTPCT" in the last 72 hours. Urine analysis:    Component Value Date/Time   COLORURINE STRAW (A) 08/24/2022 0213   APPEARANCEUR CLEAR 08/24/2022 0213   LABSPEC 1.004 (L) 08/24/2022 0213   PHURINE 7.0  08/24/2022 0213   GLUCOSEU NEGATIVE 08/24/2022 0213   HGBUR SMALL (A) 08/24/2022 0213   BILIRUBINUR NEGATIVE 08/24/2022 0213   KETONESUR NEGATIVE 08/24/2022 0213   PROTEINUR 30 (A) 08/24/2022 0213   UROBILINOGEN 1.0 04/03/2014 1645   NITRITE NEGATIVE 08/24/2022 0213   LEUKOCYTESUR NEGATIVE 08/24/2022 3244    Radiological Exams on Admission: I have personally reviewed images CT ABDOMEN PELVIS W CONTRAST  Result Date: 08/24/2022 CLINICAL DATA:  Abdominal pain.  History of hematuria and cirrhosis. EXAM: CT ABDOMEN AND PELVIS WITH CONTRAST TECHNIQUE: Multidetector CT imaging of the abdomen and pelvis was performed using the standard protocol following bolus administration of intravenous contrast. RADIATION DOSE REDUCTION: This exam was performed according to the departmental dose-optimization program which includes automated exposure control, adjustment of the mA and/or kV according to patient size and/or use of iterative reconstruction technique. CONTRAST:  75mL OMNIPAQUE IOHEXOL 300 MG/ML  SOLN COMPARISON:  06/26/2022 FINDINGS: Lower chest: Scar versus subsegmental atelectasis noted in the lung bases. No pleural fluid or airspace consolidation. Hepatobiliary: The liver has a cirrhotic morphology with a diffuse nodular shrunken appearance. No focal liver abnormality noted. Gallbladder appears normal. No bile duct dilatation. Pancreas: Unremarkable. No pancreatic ductal dilatation or surrounding inflammatory changes. Spleen: Spleen measures 14 cm in length.  No focal splenic lesion. Adrenals/Urinary Tract: Normal adrenal glands. Bilateral nonobstructing renal calculi. The largest stone is in the upper pole of left kidney measuring 5 mm, image 38/3. Cyst off the upper pole of the right kidney is again seen measuring 5.4 cm. No follow-up imaging recommended. No signs of hydronephrosis or hydroureter bilaterally. The urinary bladder appears within normal limits. Stomach/Bowel: Stomach is normal. The appendix  is within normal limits. No pathologic dilatation of the large or small bowel loops. No bowel wall thickening or inflammation identified Vascular/Lymphatic: Aortic atherosclerosis. No aneurysm. No abdominopelvic adenopathy. Reproductive: Status post hysterectomy. No adnexal masses. Other: No free fluid or fluid collections. No signs of pneumoperitoneum. No ventral abdominal wall hernia identified. Musculoskeletal: No acute or significant osseous findings. Multilevel lumbar spondylosis scratch multilevel lumbar degenerative disc disease. 6 mm anterolisthesis of L4 on L5. No acute or suspicious osseous lesions. IMPRESSION: 1. No acute findings in the abdomen or pelvis. No specific findings to explain the patient's history of abdominal pain. 2. Cirrhosis with splenomegaly. No ascites. 3. Bilateral nonobstructing renal calculi. 4. Aortic Atherosclerosis (ICD10-I70.0). Electronically Signed   By: Signa Kell M.D.   On: 08/24/2022 05:28   CT Head Wo Contrast  Result Date: 08/24/2022 CLINICAL DATA:  Confusion EXAM: CT HEAD WITHOUT CONTRAST TECHNIQUE: Contiguous axial images were obtained from the base of the skull through the vertex without intravenous contrast. RADIATION DOSE REDUCTION: This exam was performed according to the departmental dose-optimization program which includes automated exposure control, adjustment of the mA and/or kV according to patient size and/or use of iterative reconstruction technique. COMPARISON:  08/17/2022 FINDINGS: Brain: No evidence of acute infarction, hemorrhage, hydrocephalus, extra-axial collection or mass lesion/mass effect. Mild  low-density in the cerebral white matter, likely chronic small vessel ischemia. Vascular: No hyperdense vessel or unexpected calcification. Skull: Normal. Negative for fracture or focal lesion. Sinuses/Orbits: No acute finding. IMPRESSION: No new or acute finding. Electronically Signed   By: Tiburcio Pea M.D.   On: 08/24/2022 05:09   DG Chest Port 1  View  Result Date: 08/24/2022 CLINICAL DATA:  Altered mental status. EXAM: PORTABLE CHEST 1 VIEW COMPARISON:  August 17, 2022 FINDINGS: The study is limited secondary to patient rotation. The heart size and mediastinal contours are within normal limits. Mild left infrahilar atelectasis is seen. No pleural effusion or pneumothorax is identified. Multilevel degenerative changes seen throughout the thoracic spine. IMPRESSION: Mild left infrahilar atelectasis. Electronically Signed   By: Aram Candela M.D.   On: 08/24/2022 01:50    EKG: My personal interpretation of EKG shows: normal sinus rhythm heart rate 78.  No ST and T wave abnormality.    Assessment/Plan: Principal Problem:   Acute metabolic encephalopathy Active Problems:   Generalized anxiety disorder   DVT (deep venous thrombosis) on Xarelto (HCC)   Chronic depression   Bipolar disorder (HCC)   Essential hypertension   Chronic kidney disease (CKD), stage III (moderate) (HCC)   Chronic diastolic CHF (congestive heart failure) (HCC)   Hepatitis B related cirrhosis (HCC)   Hyponatremia   Hypokalemia   Reactive airway disease    Assessment and Plan: Acute metabolic encephalopathy secondary from polypharmacy Altered mentation-resolved -Patient coming with nonspecific complaint and she called EMS as she was not feeling well.  Currently homeless.  Initial presentation to ED patient suddenly became violent and required Haldol 5 mg IV once. - CT head ruled out any stroke. - Obtaining MRI without contrast. -Blood alcohol level and ammonia within normal range - At home patient taking multiple antidepressant, angiolytic and medication for bipolar disorder.  Concern for polypharmacy associated acute metabolic encephalopathy/altered mentation which has been improved with Haldol.  Patient requesting for hospital admission and requesting for placement as she does not want to be homeless anymore. -If MRI rule out any acute intracranial  abnormality concern for altered mentation likely secondary from polypharmacy (patient is currently taking bupropion, doxepin, gabapentin, baclofen and Xanax) -Continue neurocheck every 4 hours - Continue fall precaution   History of DVT on Xarelto Acute PE on Xarelto (diagnosed on 08/09/2022) Noncompliance with Xarelto -Patient needs to complete Xarelto 15 mg twice daily until 08/29/2022 and then continue 20 mg daily. -Resume Xarelto 15 mg twice daily.   CKD stage IIIb -Creatinine 1.33 which is around baseline.  And GFR 44.  (Baseline GFR in between 39-51) -Avoid nephrotoxic agent   Hepatitis B related cirrhosis Reported complaining of pain around her bellybutton.  Denies any nausea, vomiting, fever, chill, change of appetite and change of weight. -CT abdomen pelvis no acute finding of the abdomen and pelvis.  Cirrhosis due to splenomegaly.  No ascites. -Resumed home Entecavir 0.5 mg every other day.   Generalized anxiety disorder Chronic depressive disorder Bipolar disorder -Patient's mood is very labile.  She has bouts of crying.  She is homeless requesting for placement.  Reported cannot take care of of herself and medications.  Denies any suicidal ideation and thought. -Resumed home Wellbutrin XL 150 mg every morning, Seroquel 100 mg at bedtime and continue Xanax 1 mg 3 times daily as needed. -Holding baclofen and Neurontin to avoid polypharmacy and drug interaction with Wellbutrin, Seroquel and Xanax. -Patient need inpatient psychiatric evaluation for further recommendation and medication adjustment.  Euvolemic hyponatremia -Slightly low sodium level 134.  Hyponatremia likely secondary from poor oral intake of solute. - Continue to monitor. -Checking serum osmolarity, urine osmolarity and urine sodium level  Hypokalemia -Slightly low potassium 3.3. -Repleted with KCl 40 meq oral once. - Checking magnesium level -Checking potassium and mag, will replete as  needed.  Reactive airway disease -Patient denies shortness of breath and wheezing. -Continue Dulera 2 puff twice daily and Ventolin 2 puffs every 4 hours as needed for wheezing/shortness of breath.   Diastolic heart failure with preserved EF 60 to 65% Essential hypertension Elevated pressure-resolved - Initial presentation patient's blood pressure was 182/117 which improved itself to 128/83. -Per chart review previous echocardiogram from 06/2022 showed EF 60 to 65% and grade 1 diastolic dysfunction. -Continue Coreg 12.5 mg twice daily and spironolactone 25 mg daily.  GERD -Continue Protonix 40 mg twice daily   DVT prophylaxis:  Xarelto Code Status:  Full Code Diet: Heart healthy regular diet Family Communication: Discussed treatment plan with patient.  Unable to reach patient daughter over phone. Disposition Plan: Pending clinical disposition.  Patient is homeless requesting for placement. Consults: Psychiatry and transition care team Admission status:   Inpatient, Med-Surg  Severity of Illness: The appropriate patient status for this patient is INPATIENT. Inpatient status is judged to be reasonable and necessary in order to provide the required intensity of service to ensure the patient's safety. The patient's presenting symptoms, physical exam findings, and initial radiographic and laboratory data in the context of their chronic comorbidities is felt to place them at high risk for further clinical deterioration. Furthermore, it is not anticipated that the patient will be medically stable for discharge from the hospital within 2 midnights of admission.   * I certify that at the point of admission it is my clinical judgment that the patient will require inpatient hospital care spanning beyond 2 midnights from the point of admission due to high intensity of service, high risk for further deterioration and high frequency of surveillance required.Marland Kitchen    Tereasa Coop, MD [facility]  How to contact the [facility] Attending or Consulting provider 7A - 7P or covering provider during after hours 7P -7A, for this patient.  Check the care team in [facility] and look for a) attending/consulting [facility] provider listed and b) the [facility] team listed Log into [email or web address] and use [facility] universal password to access. If you do not have the password, please contact the hospital operator. Locate the [facility] provider you are looking for under [facility] and page to a number that you can be directly reached. If you still have difficulty reaching the provider, please page the [facility] (Director on Call) for the Hospitalists listed on amion for assistance.  08/24/2022, 6:47 AM

## 2022-08-24 NOTE — TOC Initial Note (Signed)
Transition of Care [facility]) - Initial/Assessment Note    Patient Details  Name: [name] MRN: [id number] Date of Birth: 1955/03/15  Transition [facility]) CM/SW Contact:    Coralyn Helling, LCSW Phone Number: 08/24/2022, 12:03 PM  Clinical Narrative:  [facility] consulted for homelessness and medication assistance. TOC spoke with patient who reports being "tired and wanting tor est" Patient asked that I speak with her daughter.   TOC spoke with daughter Whitney Post by phone. Whitney Post reports that patient is not homeless and has been confused for 48 hrs. She reports that patient lives with her and has lived with her for 23 yrs. She reports they are moving in September, but no one is homeless or on the verge of being homeless. She also denies patient needing help with medications. She reports that she thinks patient took double dose of medication by accident and is now confused. She reports she will be distributing patients medications from now own.    TOC signing off. No further TOC needs.        Expected Discharge Plan: Home/Self Care Barriers to Discharge: Continued Medical Work up   Patient Goals and CMS Choice Patient states their goals for this hospitalization and ongoing recovery are:: "talk to daughter"   Choice offered to / list presented to : NA      Expected Discharge Plan and Services In-house Referral: Clinical Social Work Discharge Planning Services: NA   Living arrangements for the past 2 months: Single Family Home                                      Prior Living Arrangements/Services Living arrangements for the past 2 months: Single Family Home Lives with:: Adult Children Patient language and need for interpreter reviewed:: Yes        Need for Family Participation in Patient Care: Yes (Comment) Care giver support system in place?: Yes (comment)   Criminal Activity/Legal Involvement Pertinent to Current Situation/Hospitalization: No - Comment as needed  Activities  of Daily Living Home Assistive Devices/Equipment: Eyeglasses ADL Screening (condition at time of admission) Patient's cognitive ability adequate to safely complete daily activities?: Yes Is the patient deaf or have difficulty hearing?: No Does the patient have difficulty seeing, even when wearing glasses/contacts?: Yes Does the patient have difficulty concentrating, remembering, or making decisions?: Yes Patient able to express need for assistance with ADLs?: Yes Does the patient have difficulty dressing or bathing?: No Independently performs ADLs?: Yes (appropriate for developmental age) Does the patient have difficulty walking or climbing stairs?: Yes Weakness of Legs: Both Weakness of Arms/Hands: None  Permission Sought/Granted   Permission granted to share information with : Yes, Verbal Permission Granted  Share Information with NAME: Whitney Post, Daughter           Emotional Assessment Appearance:: Appears stated age Attitude/Demeanor/Rapport: Lethargic, Avoidant Affect (typically observed): Calm Orientation: : Oriented to Self, Oriented to Place, Oriented to Situation Alcohol / Substance Use: Not Applicable Psych Involvement: No (comment)  Admission diagnosis:  Acute encephalopathy [G93.40] Acute metabolic encephalopathy [G93.41] Patient Active Problem List   Diagnosis Date Noted   Acute metabolic encephalopathy 08/24/2022   Bipolar disorder (HCC) 08/24/2022   Hyponatremia 08/24/2022   Hypokalemia 08/24/2022   Reactive airway disease 08/24/2022   Acute pulmonary embolism (HCC) 08/09/2022   Pain and swelling of right lower leg 08/09/2022   Chronic diastolic CHF (congestive heart failure) (HCC) 06/25/2022  ABLA (acute blood loss anemia) 06/24/2022   Severe sepsis with acute organ dysfunction (HCC) 01/04/2020   Primary osteoarthritis of right knee 12/04/2019   Primary osteoarthritis of left knee 12/04/2019   Vitamin D deficiency 09/20/2019   Cellulitis of foot 09/18/2019    Aspiration pneumonia of both lower lobes due to gastric secretions (HCC) 06/02/2019   Lumbar disc herniation 05/04/2019   Encounter for colorectal cancer screening 04/19/2019   Acute respiratory distress 04/06/2019   Hyperglycemia 04/06/2019   Pleural effusion on right 04/06/2019   Benzodiazepine dependence (HCC) 02/16/2019   Cervical stenosis of spinal canal 02/15/2019   Streptococcal sepsis (HCC) 01/22/2019   History of sepsis 01/22/2019   Acute bronchitis due to Rhinovirus 11/06/2018   Lung collapse 11/01/2018   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 08/13/2018   Compression fracture of body of thoracic vertebra (HCC) 08/01/2018   Closed nondisplaced fracture of greater tuberosity of right humerus 07/08/2018   On rivaroxaban therapy 07/08/2018   Chronic anticoagulation 05/27/2018   Falls frequently 05/27/2018   History of DVT (deep vein thrombosis) 03/16/2018   History of pulmonary embolism 03/10/2018   Left lower quadrant abdominal pain 03/10/2018   Chronic pain syndrome 03/05/2018   COPD exacerbation (HCC) 06/09/2016   Abnormal urine odor 09/26/2015   Recurrent pulmonary embolism (HCC) 06/26/2015   Lupus anticoagulant positive 06/26/2015   Thrombocytopenia (HCC) 05/31/2015   UTI (lower urinary tract infection) 05/26/2015   Right flank pain    Patchy loss of hair 03/08/2015   Primary osteoarthritis of both knees 02/25/2015   Incarcerated incisional hernia s/p lap repair w mesh 12/10/2014 12/10/2014   DVT (deep venous thrombosis) on Xarelto (HCC) 10/31/2013   Pulmonary embolism (HCC) 10/26/2013   Kidney stone 10/04/2013   Tobacco abuse 10/04/2013   COPD (chronic obstructive pulmonary disease) (HCC)    Unspecified constipation 12/23/2012   Chronic kidney disease (CKD), stage III (moderate) (HCC) 12/21/2012   Allergic rhinitis 06/29/2012   Chronic low back pain 06/29/2012   Dysuria 06/29/2012   Edema 06/29/2012   Gastro-esophageal reflux  disease without esophagitis 06/29/2012   Hemorrhoids 06/29/2012   Hirsutism 06/29/2012   Insomnia 06/29/2012   Localized superficial swelling, mass, or lump 06/29/2012   Urinary incontinence 06/29/2012   Skin sensation disturbance 06/29/2012   Shortness of breath 06/29/2012   Pulmonary embolism and infarction (HCC) 06/29/2012   Proteinuria 06/29/2012   Nondependent cannabis abuse 06/29/2012   Acquired cyst of kidney 10/24/2011   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   Cocaine abuse (HCC) 11/27/2010   Chronic depression 09/23/2009   Ascites 09/23/2009   Obesity 09/01/2008   Chronic hepatitis B with cirrhosis (HCC) 08/31/2008   Acute hepatitis C virus infection 08/31/2008   Iron deficiency anemia due to chronic blood loss 08/31/2008   Generalized anxiety disorder 08/31/2008   Essential hypertension 08/31/2008   Coronary atherosclerosis 08/31/2008   Hepatitis B related cirrhosis (HCC) 08/31/2008   History of hepatitis C 08/31/2008   PCP:  Tracey Harries, MD Pharmacy:   [id number] [place], [place] - [address] AT [facility] & [address] [place] [address] Phone: [phone] Fax: [phone]  [facility] [address] [place] - [address] [place] [address] Phone: [phone] Fax: [phone]  [facility] - [place], [place] - [address] [place] [address] Phone:  [phone] Fax: [phone]     Social Determinants of Health (SDOH) Social History: SDOH Screenings   Food Insecurity: No Food Insecurity (08/24/2022)  Housing: Low Risk  (08/24/2022)  Transportation Needs: No Transportation Needs (08/24/2022)  Utilities: Not At Risk (08/24/2022)  Financial Resource Strain: Low Risk  (05/24/2022)   Received from [facility], [facility]  Physical Activity: Unknown (12/05/2021)   Received from [facility],  [facility]  Social Connections: Socially Integrated (12/05/2021)   Received from [facility], [facility]  Stress: No Stress Concern Present (12/05/2021)   Received from [facility], [facility]  Tobacco Use: Medium Risk (08/24/2022)   SDOH Interventions:     Readmission Risk Interventions    08/24/2022   11:55 AM 06/26/2022   12:01 PM 01/08/2020    8:55 AM  Readmission Risk Prevention Plan  Transportation Screening Complete Complete Complete  PCP or Specialist Appt within 5-7 Days  Complete   PCP or Specialist Appt within 3-5 Days   Complete  Home Care Screening  Complete   Medication Review (RN CM)  Complete   HRI or Home Care Consult   Complete  Social Work Consult for Recovery Care Planning/Counseling   Complete  Palliative Care Screening   Not Applicable  Medication Review Oceanographer)   Complete  HRI or Home Care Consult Complete    SW Recovery Care/Counseling Consult Complete

## 2022-08-24 NOTE — Consult Note (Signed)
[facility] Face-to-Face Psychiatry Consult   Reason for Consult:   Acute metabolic encephalopathy secondary from polypharmacy, antidepressant, and generalized anxiety.  Need help with medication reconciliation.  History of anxiety, depression, and bipolar. Referring Physician: Dr. Janalyn Shy Patient Identification: [name] MRN:  [id number] Principal Diagnosis: Acute metabolic encephalopathy Diagnosis:  Principal Problem:   Acute metabolic encephalopathy Active Problems:   Generalized anxiety disorder   Essential hypertension   Hepatitis B related cirrhosis (HCC)   Chronic kidney disease (CKD), stage III (moderate) (HCC)   DVT (deep venous thrombosis) on Xarelto (HCC)   Chronic depression   Chronic diastolic CHF (congestive heart failure) (HCC)   Bipolar disorder (HCC)   Hyponatremia   Hypokalemia   Reactive airway disease   Total Time spent with patient: 1 hour  Subjective:   [name] is a 67 y.o. female patient admitted with altered mentation.  Patient developed confusion and became aggressive while in the emergency department, therefore psychiatric consult was placed.  As per patient she is able to recall last night's events leading up to hospitalization.  She does report a previous psychiatric history of agoraphobia with panic attacks, generalized anxiety, bipolar disorder?,  Depression.  She is currently managed by Mingo Amber, psychiatric nurse practitioner.  Her current medication regimen includes alprazolam 1 mg p.o. 3 times daily (verified by PDMP), bupropion 150 p.o. every morning, doxepin 100 mg p.o. nightly, gabapentin 300 mg p.o. 3 times daily.  She does feel that she has been on appropriate medication regimen, and unclear as to what happened that led to this admission.  Patient does report recently using hydrocodone (also verified by PDMP), and identifies hydrocodone as most recent medication change.  Patient reports since taking this medication she has noticed increase in  confusion, falls, and weakness; and contributes these changes due to hydrocodone.  While assessing for drug abuse, diversion, illicit substance use; patient expresses great interest in beginning benzodiazepine taper as she is fearful of additional things that will happen.  However she does endorse anxiety about her upcoming move with her daughter, and would like to hold off on benzodiazepine taper until they have moved and are stable.  Patient does appear to have appropriate insight and judgment, also seems future oriented and advocating for her overall mental health.  We briefly discussed pharmacology/kinetics associated with benzodiazepine and Wellbutrin that can lead to complications such as seizure, falls, mania, and confusion.   During evaluation [name] 67 y.o. is seated at the edge of hospital chair. She is alert/oriented x 4; calm/cooperative; and mood congruent with affect.  Patient is speaking in a clear tone at moderate volume, and normal pace; with good eye contact.  Her thought process is coherent and relevant; There is no indication that she is currently responding to internal/external stimuli or experiencing delusional thought content.  Patient denies suicidal/self-harm/homicidal ideation, psychosis, and paranoia.  She furthermore denies a undisclosed suicide intent that resulted in confusion and altered mental status on admission.  Patient has remained calm throughout assessment and has answered questions appropriately.  Her current findings are incongruent with information obtained in HPI, historically this has been the case as patient continues to present to the emergency room with vague complaints.    HPI:  [name] is a 67 y.o. female with medical history significant of significant of chronic depression, generalized anxiety disorder, bipolar disorder, CKD stage IIIb, COPD, hypertension, hepatitis B associated cirrhosis, iron deficiency anemia and history of VTE on Xarelto presented to  emergency department  with complaining of altered mentation.  Patient called EMS as she was not feeling fine at home.  Patient has nonspecific lot of complaints.  She has pain around her bellybutton.  Denies any nausea, vomiting, change of appetite and weight.  Reported she is unable to clean herself after bathroom and crying that she needed help with that.  She is homeless and requesting for placement.  She has multiple bouts of crying spell and complaining about bilateral lower extremities pain more on the right side. Patient denies any chest pain, shortness of breath, palpitation, headache, blurry vision, focal weakness, facial droop, nausea, vomiting, diarrhea, change of appetite change of weight. Patient is a very poor historian.    Past Psychiatric History: Anxiety, depression, nicotine use disorder, questionable bipolar diagnosis.  Currently being managed by [facility] appointments outpatient.  She denies any current illicit substance use.  She denies any current alcohol use.  She denies any recent inpatient psychiatric hospitalization.  She denies any appropriate misuse or diversion of benzodiazepines.  She denies any legal charges, court dates, and or probation.  Risk to Self:  Denies Risk to Others:  Denies Prior Inpatient Therapy:  Denies  prior Outpatient Therapy:  Denies  Past Medical History:  Past Medical History:  Diagnosis Date   Agoraphobia with panic attacks    Altered mental state 10/26/2013   Ankle fracture, left 11/27/2010   S/p ORIF 11/2    Anxiety    Arthritis    "knees; lower back" (10/30/2013)   Bipolar disorder (HCC)    Cavitary pneumonia    Cirrhosis (HCC)    CKD (chronic kidney disease), stage III (HCC)    COPD (chronic obstructive pulmonary disease) (HCC)    Depression    DVT (deep venous thrombosis) (HCC) 09/2013   RLE   GERD (gastroesophageal reflux disease)    Heart murmur    Hepatitis B    History of blood transfusion    "due to excessive blood loss before  hysterectomy"   History of urinary tract infection    Hypertension    currently on no medication    Kidney stones    Pneumonia    "4 times in the past year" (10/30/2013)   Pulmonary emboli (HCC) 09/2013   Reactive airway disease 08/24/2022   Shortness of breath dyspnea    increased exertion;exercise   Urinary frequency    Urinary incontinence     Past Surgical History:  Procedure Laterality Date   ABDOMINAL HYSTERECTOMY     ANKLE FRACTURE SURGERY Left    BIOPSY  06/26/2022   Procedure: BIOPSY;  Surgeon: Vida Rigger, MD;  Location: [facility];  Service: Gastroenterology;;   CARPAL TUNNEL RELEASE Left    DILATION AND CURETTAGE OF UTERUS  X 2   ESOPHAGOGASTRODUODENOSCOPY (EGD) WITH PROPOFOL N/A 06/26/2022   Procedure: ESOPHAGOGASTRODUODENOSCOPY (EGD) WITH PROPOFOL;  Surgeon: Vida Rigger, MD;  Location: [facility];  Service: Gastroenterology;  Laterality: N/A;   FRACTURE SURGERY     HAND SURGERY     1980's/left hand    INSERTION OF MESH N/A 12/10/2014   Procedure: INSERTION OF MESH;  Surgeon: Karie Soda, MD;  Location: [facility];  Service: General;  Laterality: N/A;   LAPAROSCOPIC ASSISTED VENTRAL HERNIA REPAIR N/A 12/10/2014   Procedure: LAPAROSCOPIC ASSISTED REPAIR OF INCARCERATED UMBILICAL HERNIA ;  Surgeon: Karie Soda, MD;  Location: [facility];  Service: General;  Laterality: N/A;   TUBAL LIGATION     Family History:  Family History  Problem Relation Age of Onset  Stroke Mother    Lung cancer Father    Family Psychiatric  History: Denies Social History:  Social History   Substance and Sexual Activity  Alcohol Use No   Alcohol/week: 0.0 standard drinks of alcohol   Comment: "stopped drinking  in 2004"     Social History   Substance and Sexual Activity  Drug Use No   Types: "Crack" cocaine, Marijuana   Comment: 10/30/2013 "last crack in 2014; last marijuana in ~ 1990" drug free for last 3 years     Social History   Socioeconomic History   Marital status: Single     Spouse name: Not on file   Number of children: Not on file   Years of education: Not on file   Highest education level: Not on file  Occupational History   Not on file  Tobacco Use   Smoking status: Former    Current packs/day: 0.00    Average packs/day: 0.5 packs/day for 35.0 years (17.5 ttl pk-yrs)    Types: Cigarettes    Start date: 10/27/1978    Quit date: 10/26/2013    Years since quitting: 8.8   Smokeless tobacco: Never  Vaping Use   Vaping status: Some Days   Substances: Nicotine, Flavoring  Substance and Sexual Activity   Alcohol use: No    Alcohol/week: 0.0 standard drinks of alcohol    Comment: "stopped drinking  in 2004"   Drug use: No    Types: "Crack" cocaine, Marijuana    Comment: 10/30/2013 "last crack in 2014; last marijuana in ~ 1990" drug free for last 3 years    Sexual activity: Never  Other Topics Concern   Not on file  Social History Narrative   Not on file   Social Determinants of Health   Financial Resource Strain: Low Risk  (05/24/2022)   Received from [facility], [facility]   Overall Financial Resource Strain (CARDIA)    Difficulty of Paying Living Expenses: Not hard at all  Food Insecurity: No Food Insecurity (08/24/2022)   Hunger Vital Sign    Worried About Running Out of Food in the Last Year: Never true    Ran Out of Food in the Last Year: Never true  Transportation Needs: No Transportation Needs (08/24/2022)   PRAPARE - Administrator, Civil Service (Medical): No    Lack of Transportation (Non-Medical): No  Physical Activity: Unknown (12/05/2021)   Received from [facility], [facility]   Exercise Vital Sign    Days of Exercise per Week: 0 days    Minutes of Exercise per Session: Not on file  Stress: No Stress Concern Present (12/05/2021)   Received from [facility], [facility] - Occupational Stress Questionnaire    Feeling of Stress : Only a little  Social Connections:  Socially Integrated (12/05/2021)   Received from [facility], [facility]   Social Network    How would you rate your social network (family, work, friends)?: Good participation with social networks   Additional Social History:    Allergies:   Allergies  Allergen Reactions   Heparin Hives    Pt states she cannot take because she will break out in hives   Amitriptyline Hcl Other (See Comments)     Causes her to be very "disoriented".   Azithromycin Rash   Fentanyl Other (See Comments)    Sees things Pt stated had hallucinations "d/t taking high dosage, but can tolerate low dosage"  Cymbalta [Duloxetine Hcl] Other (See Comments)    Headaches    Neomycin-Bacitracin Zn-Polymyx Rash    Labs:  Results for orders placed or performed during the hospital encounter of 08/23/22 (from the past 48 hour(s))  Comprehensive metabolic panel     Status: Abnormal   Collection Time: 08/24/22  2:10 AM  Result Value Ref Range   Sodium 134 (L) 135 - 145 mmol/L   Potassium 3.3 (L) 3.5 - 5.1 mmol/L   Chloride 99 98 - 111 mmol/L   CO2 22 22 - 32 mmol/L   Glucose, Bld 97 70 - 99 mg/dL    Comment: Glucose reference range applies only to samples taken after fasting for at least 8 hours.   BUN 16 8 - 23 mg/dL   Creatinine, Ser 4.09 (H) 0.44 - 1.00 mg/dL   Calcium 9.8 8.9 - 81.1 mg/dL   Total Protein 8.4 (H) 6.5 - 8.1 g/dL   Albumin 4.2 3.5 - 5.0 g/dL   AST 51 (H) 15 - 41 U/L   ALT 29 0 - 44 U/L   Alkaline Phosphatase 100 38 - 126 U/L   Total Bilirubin 0.8 0.3 - 1.2 mg/dL   GFR, Estimated 44 (L) >60 mL/min    Comment: (NOTE) Calculated using the CKD-EPI Creatinine Equation (2021)    Anion gap 13 5 - 15    Comment: Performed at [facility], [address], [place], [place] [address]  Ethanol     Status: None   Collection Time: 08/24/22  2:10 AM  Result Value Ref Range   Alcohol, Ethyl (B) <10 <10 mg/dL    Comment: (NOTE) Lowest detectable limit for serum alcohol is  10 mg/dL.  For medical purposes only. Performed at [facility], [address], [place], [place] [address]   CBC with Differential     Status: Abnormal   Collection Time: 08/24/22  2:10 AM  Result Value Ref Range   WBC 9.4 4.0 - 10.5 K/uL   RBC 4.64 3.87 - 5.11 MIL/uL   Hemoglobin 12.9 12.0 - 15.0 g/dL   HCT 13.0 86.5 - 78.4 %   MCV 87.1 80.0 - 100.0 fL   MCH 27.8 26.0 - 34.0 pg   MCHC 31.9 30.0 - 36.0 g/dL   RDW 69.6 (H) 29.5 - 28.4 %   Platelets 167 150 - 400 K/uL   nRBC 0.0 0.0 - 0.2 %   Neutrophils Relative % 77 %   Neutro Abs 7.2 1.7 - 7.7 K/uL   Lymphocytes Relative 17 %   Lymphs Abs 1.6 0.7 - 4.0 K/uL   Monocytes Relative 6 %   Monocytes Absolute 0.6 0.1 - 1.0 K/uL   Eosinophils Relative 0 %   Eosinophils Absolute 0.0 0.0 - 0.5 K/uL   Basophils Relative 0 %   Basophils Absolute 0.0 0.0 - 0.1 K/uL   Immature Granulocytes 0 %   Abs Immature Granulocytes 0.03 0.00 - 0.07 K/uL    Comment: Performed at [facility], [address], [place], [place] [address]  Ammonia     Status: None   Collection Time: 08/24/22  2:10 AM  Result Value Ref Range   Ammonia 20 9 - 35 umol/L    Comment: Performed at [facility], [address] [place], [place] [address]  Blood gas, venous     Status: None   Collection Time: 08/24/22  2:10 AM  Result Value Ref Range   pH, Ven 7.36 7.25 -  7.43   pCO2, Ven 44 44 - 60 mmHg   pO2, Ven 36 32 - 45 mmHg   Bicarbonate 24.9 20.0 - 28.0 mmol/L   Acid-base deficit 0.8 0.0 - 2.0 mmol/L   O2 Saturation 60.6 %   Patient temperature 37.0     Comment: Performed at [facility], [address], [place], [place] [address]  Magnesium     Status: None   Collection Time: 08/24/22  2:10 AM  Result Value Ref Range   Magnesium 1.9 1.7 - 2.4 mg/dL    Comment: Performed at [facility], [address], [place], [place] [address]  Osmolality     Status: None    Collection Time: 08/24/22  2:10 AM  Result Value Ref Range   Osmolality 290 275 - 295 mOsm/kg    Comment: Performed at [facility], [address] [place], [place] [address]  Urinalysis, w/ Reflex to Culture (Infection Suspected) -Urine, Clean Catch     Status: Abnormal   Collection Time: 08/24/22  2:13 AM  Result Value Ref Range   Specimen Source URINE, CLEAN CATCH    Color, Urine STRAW (A) YELLOW   APPearance CLEAR CLEAR   Specific Gravity, Urine 1.004 (L) 1.005 - 1.030   pH 7.0 5.0 - 8.0   Glucose, UA NEGATIVE NEGATIVE mg/dL   Hgb urine dipstick SMALL (A) NEGATIVE   Bilirubin Urine NEGATIVE NEGATIVE   Ketones, ur NEGATIVE NEGATIVE mg/dL   Protein, ur 30 (A) NEGATIVE mg/dL   Nitrite NEGATIVE NEGATIVE   Leukocytes,Ua NEGATIVE NEGATIVE   RBC / HPF 0-5 0 - 5 RBC/hpf   WBC, UA 0-5 0 - 5 WBC/hpf    Comment:        Reflex urine culture not performed if WBC <=10, OR if Squamous epithelial cells >5. If Squamous epithelial cells >5 suggest recollection.    Bacteria, UA NONE SEEN NONE SEEN   Squamous Epithelial / HPF 0-5 0 - 5 /HPF    Comment: Performed at [facility], [address], [place], [place] [address]  Urine rapid drug screen (hosp performed)     Status: Abnormal   Collection Time: 08/24/22  2:13 AM  Result Value Ref Range   Opiates NONE DETECTED NONE DETECTED   Cocaine NONE DETECTED NONE DETECTED   Benzodiazepines POSITIVE (A) NONE DETECTED   Amphetamines NONE DETECTED NONE DETECTED   Tetrahydrocannabinol NONE DETECTED NONE DETECTED   Barbiturates NONE DETECTED NONE DETECTED    Comment: (NOTE) DRUG SCREEN FOR MEDICAL PURPOSES ONLY.  IF CONFIRMATION IS NEEDED FOR ANY PURPOSE, NOTIFY LAB WITHIN 5 DAYS.  LOWEST DETECTABLE LIMITS FOR URINE DRUG SCREEN Drug Class                     Cutoff (ng/mL) Amphetamine and metabolites    1000 Barbiturate and metabolites    200 Benzodiazepine                 200 Opiates and metabolites         300 Cocaine and metabolites        300 THC                            50 Performed at [facility], [address] [place], [place] [address]   Sodium, urine, random     Status: None   Collection Time: 08/24/22  6:46 AM  Result Value Ref Range   Sodium, Ur 56 mmol/L    Comment: Performed at [facility], [address], [place], [place] [address]  Osmolality, urine     Status: Abnormal   Collection Time: 08/24/22  6:46 AM  Result Value Ref Range   Osmolality, Ur 189 (L) 300 - 900 mOsm/kg    Comment: Performed at [facility], [address] [place], [place] [address]    Current Facility-Administered Medications  Medication Dose Route Frequency Provider Last Rate Last Admin   0.9 %  sodium chloride infusion  250 mL Intravenous PRN Janalyn Shy, Subrina, MD       acetaminophen (TYLENOL) tablet 650 mg  650 mg Oral Q6H PRN Janalyn Shy, Subrina, MD   650 mg at 08/24/22 1234   Or   acetaminophen (TYLENOL) suppository 650 mg  650 mg Rectal Q6H PRN Janalyn Shy, Subrina, MD       albuterol (PROVENTIL) (2.5 MG/3ML) 0.083% nebulizer solution 3 mL  3 mL Inhalation Q4H PRN Janalyn Shy, Subrina, MD       ALPRAZolam Prudy Feeler) tablet 1 mg  1 mg Oral TID PRN Janalyn Shy, Subrina, MD       buPROPion (WELLBUTRIN XL) 24 hr tablet 150 mg  150 mg Oral q morning Sundil, Subrina, MD   150 mg at 08/24/22 1234   carvedilol (COREG) tablet 12.5 mg  12.5 mg Oral BID WC Sundil, Subrina, MD   12.5 mg at 08/24/22 1622   docusate sodium (COLACE) capsule 100 mg  100 mg Oral BID Sundil, Subrina, MD   100 mg at 08/24/22 1234   doxepin (SINEQUAN) capsule 100 mg  100 mg Oral QHS Sundil, Subrina, MD       entecavir (BARACLUDE) tablet 0.5 mg  0.5 mg Oral QODAY Sundil, Subrina, MD       gabapentin (NEURONTIN) capsule 300 mg  300 mg Oral TID Narda Bonds, MD   300 mg at 08/24/22 1622   hydrALAZINE (APRESOLINE) injection 10 mg  10 mg Intravenous Q6H PRN Sundil, Subrina, MD       mometasone-formoterol  [facility]) 200-5 MCG/ACT inhaler 2 puff  2 puff Inhalation BID Sundil, Subrina, MD       montelukast (SINGULAIR) tablet 10 mg  10 mg Oral QHS Sundil, Subrina, MD       ondansetron [facility]) tablet 4 mg  4 mg Oral Q6H PRN Janalyn Shy, Subrina, MD       Or   ondansetron [facility]) injection 4 mg  4 mg Intravenous Q6H PRN Janalyn Shy, Subrina, MD       pantoprazole (PROTONIX) EC tablet 40 mg  40 mg Oral BID AC Sundil, Subrina, MD   40 mg at 08/24/22 1622   [START ON 08/25/2022] Rivaroxaban (XARELTO) tablet 15 mg  15 mg Oral BID WC Ellington, Abby K, RPH       Followed by   Melene Muller ON 08/30/2022] rivaroxaban (XARELTO) tablet 20 mg  20 mg Oral Q supper Ellington, Abby K, RPH       sodium chloride flush (NS) 0.9 % injection 3 mL  3 mL Intravenous Q12H Sundil, Subrina, MD   3 mL at 08/24/22 1148   sodium chloride flush (NS) 0.9 % injection 3 mL  3 mL Intravenous PRN Sundil, Subrina, MD       spironolactone (ALDACTONE) tablet 25 mg  25 mg Oral Daily Sundil, Subrina, MD   25 mg at 08/24/22 1144    Musculoskeletal: Strength & Muscle Tone: within normal limits Gait & Station:  normal Patient leans: N/A            Psychiatric Specialty Exam:  Presentation  General Appearance:  Appropriate for Environment; Casual  Eye Contact: Good  Speech: Clear and Coherent; Normal Rate  Speech Volume: Normal  Handedness: Right   Mood and Affect  Mood: Euthymic  Affect: Appropriate; Congruent   Thought Process  Thought Processes: Coherent; Linear; Goal Directed  Descriptions of Associations:Intact  Orientation:Full (Time, Place and Person)  Thought Content:WDL  History of Schizophrenia/Schizoaffective disorder:No data recorded Duration of Psychotic Symptoms:No data recorded Hallucinations:Hallucinations: None  Ideas of Reference:None  Suicidal Thoughts:Suicidal Thoughts: No  Homicidal Thoughts:Homicidal Thoughts: No   Sensorium  Memory: Immediate Good; Recent Good; Remote  Good  Judgment: Good  Insight: Good   Executive Functions  Concentration: Good  Attention Span: Good  Recall: Good  Fund of Knowledge: Good  Language: Good   Psychomotor Activity  Psychomotor Activity: Psychomotor Activity: Normal   Assets  Assets: Communication Skills; Desire for Improvement; Physical Health; Social Support; Housing; Financial Resources/Insurance   Sleep  Sleep: Sleep: Fair   Physical Exam: Physical Exam Vitals and nursing note reviewed.  Constitutional:      Appearance: Normal appearance. She is obese.  Skin:    Capillary Refill: Capillary refill takes less than 2 seconds.  Neurological:     General: No focal deficit present.     Mental Status: She is alert and oriented to person, place, and time. Mental status is at baseline.  Psychiatric:        Mood and Affect: Mood normal.        Behavior: Behavior normal.        Thought Content: Thought content normal.        Judgment: Judgment normal.    ROS Blood pressure 105/64, pulse 73, temperature (!) 97.1 F (36.2 C), resp. rate 16, height 5\' 4"  (1.626 m), weight 106.6 kg, SpO2 95%. Body mass index is 40.34 kg/m.  Treatment Plan Summary: Plan   -No current changes will be made to medication management at this time, as patient does not appear to present with any confusion or encephalopathic.  She is appropriate for outpatient psychiatric follow-up. -Recommend refraining from opiates use and benzodiazepine use in combination.  When in combination both of these medications are unknown to close weakness, confusion, altered mental status, and increase patient's risk for falls. -TOC referral for outpatient psychiatric therapy, current psychiatric provider does not offer therapy and office.  Labs reviewed and assessed appear to be normal with the exception of potassium (3.3), sodium(134), elevated AST (51), abnormal urinalysis, urine drug screen positive for benzodiazepines (currently  prescribed).  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   Psychiatric consult service to sign off at this time.  Disposition: No evidence of imminent risk to self or others at present.   Patient does not meet criteria for psychiatric inpatient admission. Supportive therapy provided about ongoing stressors. Discussed crisis plan, support from social network, calling 911, coming to the Emergency Department, and calling Suicide Hotline.  Maryagnes Amos, FNP 08/24/2022 5:17 PM

## 2022-08-24 NOTE — Progress Notes (Signed)
   Patient seen and examined at bedside, patient admitted after midnight, please see earlier detailed admission note by Tereasa Coop, MD. Briefly, patient presented secondary to altered mental status concerning for polypharmacy.  Subjective:  Patient reports continuing to feel confused. She also reports burning pain of her right lower extremity. No other concerns this morning.  BP 117/69 (BP Location: Left Arm)   Pulse 60   Temp (!) 97.5 F (36.4 C)   Resp 16   Ht 5\' 4"  (1.626 m)   Wt 106.6 kg   SpO2 96%   BMI 40.34 kg/m   General exam: Appears calm and comfortable Respiratory system: Mild wheezing. Respiratory effort normal. Cardiovascular system: S1 & S2 heard, RRR. No murmurs. Gastrointestinal system: Abdomen is nondistended, soft and nontender. Normal bowel sounds heard. Central nervous system: Alert and oriented. Musculoskeletal: RLE edema. Right calf tenderness   Brief assessment/Plan:  Acute metabolic encephalopathy Secondary to polypharmacy. Improved. Head imaging without acute process to identify etiology. Psychiatry consulted. -PT/OT recs -Psychiatry recs  CKD stage IIIb Hepatitis B related cirrhosis Hyponatremia Bipolar disorder Depression GERD Diastolic heart failure Primary hypertension Per H&P  Family communication: Daughter at bedside DVT prophylaxis: Xarelto Disposition: Discharge home likely in 24 hours  Jacquelin Hawking, MD [facility] 08/24/2022, 5:44 PM

## 2022-08-24 NOTE — ED Notes (Signed)
Upon entering the room, pt taking all of her clothes on. Staff stated that pt needed to keep her clothes on and she said no. Pt continues to take off gown. Pt also became agitated while trying to give her an IV. RN needed three other persons to help hold pt down. Pt was still agitated so mittens were placed on pt. Pt wanted to get out of bed and walk around the room. RN asked MD for medication to help calm the pt down.

## 2022-08-24 NOTE — Evaluation (Signed)
Occupational Therapy Evaluation Patient Details Name: [name] MRN: [id number] DOB: 1955/12/12 Today's Date: 08/24/2022   History of Present Illness Pt is a 67 year old female presenting to ED for AMS.  PMHx including but not limited to COPD not on oxygen, chronic pain/OA on opiate, hepatitis B, DVT/PE on Xarelto, diastolic CHF, HTN, anxiety, bipolar disorder, CKD-3 and morbid obesity.   Clinical Impression   Pt admitted with the above diagnosis. Pt currently with functional limitations due to the deficits listed below (see OT Problem List). Pt reports that she lives at home with her daughter, who works full time. She typically is Mod I with BADL tasks although requires assist for toileting hygiene as she is unable to reach d/t body habitus and UE shoulder deficits. Pt educated and provided information handout on use of AE/DME such as toilet tongs, tub bench, and BSC that could help decrease level of difficulty when completing ADL tasks. Pt concerned about purchases items herself as she is on a fixed income. Recommend follow up home Health OT services to further assess pt's functional performance and assist with increasing her level of independence with ADL tasks.  Pt will benefit from acute skilled OT to increase their safety and independence with ADL and functional mobility for ADL to facilitate discharge. OT will continue to follow patient acutely.        Recommendations for follow up therapy are one component of a multi-disciplinary discharge planning process, led by the attending physician.  Recommendations may be updated based on patient status, additional functional criteria and insurance authorization.   Assistance Recommended at Discharge Intermittent Supervision/Assistance  Patient can return home with the following A little help with walking and/or transfers;A little help with bathing/dressing/bathroom;Help with stairs or ramp for entrance;Assistance with cooking/housework;Assist for  transportation    Functional Status Assessment  Patient has had a recent decline in their functional status and demonstrates the ability to make significant improvements in function in a reasonable and predictable amount of time.  Equipment Recommendations  Tub/shower bench;Other (comment);BSC/3in1 (toilet tongs. Provided handout for AE/DME education and where to purchase. Pt would like to wait until they move to assess the bathroom.)       Precautions / Restrictions Precautions Precautions: Fall Precaution Comments: confusion      Mobility Bed Mobility Overal bed mobility:  (Pt up in recliner upon therapy arrival)                  Transfers Overall transfer level: Needs assistance Equipment used: Rolling walker (2 wheels) Transfers: Sit to/from Stand Sit to Stand: Min assist    General transfer comment: very minimal assist to power up into stand. VC for hand placement with RW use.      Balance Overall balance assessment: History of Falls     ADL either performed or assessed with clinical judgement   ADL Overall ADL's : Needs assistance/impaired Eating/Feeding: Independent;Sitting   Grooming: Wash/dry hands;Wash/dry face;Oral care;Set up;Sitting   Upper Body Bathing: Minimal assistance;Sitting   Lower Body Bathing: Maximal assistance;Sitting/lateral leans;Sit to/from stand   Upper Body Dressing : Minimal assistance;Cueing for sequencing;Sitting   Lower Body Dressing: Total assistance;Sit to/from stand        Vision Baseline Vision/History: 1 Wears glasses Ability to See in Adequate Light: 1 Impaired Patient Visual Report: No change from baseline Vision Assessment?: No apparent visual deficits            Pertinent Vitals/Pain Pain Assessment Pain Assessment: Faces Faces Pain Scale: Hurts  little more Pain Location: R lower leg Pain Descriptors / Indicators: Tender Pain Intervention(s): Monitored during session, Limited activity within patient's  tolerance     Hand Dominance Right   Extremity/Trunk Assessment Upper Extremity Assessment Upper Extremity Assessment: Generalized weakness LUE Deficits / Details: pt reports RTC tear with impaired shoulder ROM and use. Demonstrates generalized weakness d/t shoulder deficits.   Lower Extremity Assessment Lower Extremity Assessment: RLE deficits/detail RLE Deficits / Details: redness noted from shin to ankle. Pt reports pain with all movement.   Cervical / Trunk Assessment Cervical / Trunk Assessment: Other exceptions Cervical / Trunk Exceptions: body habitus   Communication Communication Communication: No difficulties   Cognition Arousal/Alertness: Awake/alert Behavior During Therapy: WFL for tasks assessed/performed Overall Cognitive Status: No family/caregiver present to determine baseline cognitive functioning            General Comments: admitted with AMS, appears appropriate during session although did repeat herself on occassion. Required occassional clarification during education.     General Comments  Skin red and warm RLE shin to ankle.            Home Living Family/patient expects to be discharged to:: Private residence Living Arrangements: Children Available Help at Discharge: Family;Available PRN/intermittently (Daughter works) Type of Home: House Home Access: Stairs to enter Secretary/administrator of Steps: 3 Entrance Stairs-Rails: Right Home Layout: Able to live on main level with bedroom/bathroom     Bathroom Shower/Tub: Chief Strategy Officer: Standard     Home Equipment: Agricultural consultant (2 wheels);Gilmer Mor - single point   Additional Comments: lives with her daughter, [name]. Pt reports that they plan to move in September.      Prior Functioning/Environment Prior Level of Function : Independent/Modified Independent;Needs assist       Physical Assist : ADLs (physical)   ADLs (physical): Toileting Mobility Comments: uses  cane ADLs Comments: Reports that she has difficulty with toilet hygiene d/t body habitus and shoulder deficits. Left shoulder is more impaired than right shoulder.        OT Problem List: Decreased knowledge of use of DME or AE;Impaired UE functional use      OT Treatment/Interventions: Self-care/ADL training;Therapeutic exercise;Therapeutic activities;Energy conservation;Patient/family education;DME and/or AE instruction;Balance training    OT Goals(Current goals can be found in the care plan section) Acute Rehab OT Goals Patient Stated Goal: to be able to wipe herself after toileting OT Goal Formulation: Patient unable to participate in goal setting Time For Goal Achievement: 09/07/22 Potential to Achieve Goals: Fair  OT Frequency: Min 1X/week       AM-PAC OT "6 Clicks" Daily Activity     Outcome Measure Help from another person eating meals?: None Help from another person taking care of personal grooming?: A Little Help from another person toileting, which includes using toliet, bedpan, or urinal?: A Lot Help from another person bathing (including washing, rinsing, drying)?: Total Help from another person to put on and taking off regular upper body clothing?: A Little Help from another person to put on and taking off regular lower body clothing?: A Lot 6 Click Score: 15   End of Session Equipment Utilized During Treatment: Rolling walker (2 wheels)  Activity Tolerance: Patient tolerated treatment well Patient left: in chair;with call bell/phone within reach  OT Visit Diagnosis: Muscle weakness (generalized) (M62.81);Unsteadiness on feet (R26.81)                Time: 1610-9604 OT Time Calculation (min): 37 min Charges:  OT General Charges $  OT Visit: 1 Visit OT Evaluation $OT Eval Moderate Complexity: 1 Mod OT Treatments $Self Care/Home Management : 23-37 mins  Limmie Patricia, OTR/L,CBIS  Supplemental OT - [facility] and [facility] Secure Chat Preferred    Remi Lopata, Charisse March 08/24/2022, 4:14 PM

## 2022-08-24 NOTE — ED Provider Notes (Signed)
[place] EMERGENCY DEPARTMENT AT [facility] Provider Note   CSN: [id number] Arrival date & time: 08/23/22  2221     History  Chief Complaint  Patient presents with   Altered Mental Status    [name] is a 67 y.o. female.  The history is provided by the patient and medical records.  Altered Mental Status [name] is a 67 y.o. female who presents to the Emergency Department complaining of altered mental.  Patient was brought into the emergency department by EMS for multiple complaints per EMS.  When patient is placed in a room and evaluated she is unsure why she is in the emergency department.  When asked what her concerns are she states that her she cannot see her bellybutton and she is uncomfortable in her private.  She is unable to provide additional history or details.  On review of systems she does report shortness of breath, dysuria, lower abdominal discomfort.  She does report living in a trailer, she does not know who she lives with or how she got to the emergency department.  She denies any tobacco, alcohol, drug use.  She does not know if she feels safe at home.     Home Medications Prior to Admission medications   Medication Sig Start Date End Date Taking? Authorizing Provider  albuterol (PROAIR HFA) 108 (90 Base) MCG/ACT inhaler INHALE 2 PUFFS INTO THE LUNGS EVERY 4 HOURS AS NEEDED FOR WHEEZING Patient taking differently: Inhale 2 puffs into the lungs every 4 (four) hours as needed for shortness of breath or wheezing. 12/04/18   Oretha Milch, MD  albuterol (PROVENTIL) (2.5 MG/3ML) 0.083% nebulizer solution Take 3 mLs (2.5 mg total) by nebulization every 4 (four) hours as needed for wheezing or shortness of breath. 12/04/18   Oretha Milch, MD  ALPRAZolam Prudy Feeler) 1 MG tablet Take 1 tablet (1 mg total) by mouth 3 (three) times daily. 06/27/22   Shalhoub, Deno Lunger, MD  baclofen (LIORESAL) 20 MG tablet Take 20 mg by mouth at bedtime. 04/22/20   [provider]  budesonide-formoterol (SYMBICORT) 160-4.5 MCG/ACT inhaler Inhale 2 puffs into the lungs 2 (two) times daily. 12/04/18 06/24/22  Oretha Milch, MD  buPROPion (WELLBUTRIN XL) 150 MG 24 hr tablet Take 150 mg by mouth every morning.    [provider]  carvedilol (COREG) 12.5 MG tablet Take 1 tablet (12.5 mg total) by mouth 2 (two) times daily with a meal. 06/27/22   Shalhoub, Deno Lunger, MD  Cholecalciferol (VITAMIN D3) 50 MCG (2000 UT) TABS Take 2,000 Units by mouth daily.    [provider]  clotrimazole-betamethasone (LOTRISONE) cream Apply to affected area 2 times daily prn Patient taking differently: Apply 1 application  topically 2 (two) times daily as needed (rash). Apply to affected area 2 times daily prn 04/29/20   Cathren Laine, MD  docusate sodium (COLACE) 100 MG capsule Take 100 mg by mouth 2 (two) times daily.    [provider]  doxepin (SINEQUAN) 100 MG capsule Take 100 mg by mouth at bedtime. 06/01/22   [provider]  entecavir (BARACLUDE) 0.5 MG tablet Take 0.5 mg by mouth every other day. 11/25/19   [provider]  famotidine (PEPCID) 20 MG tablet Take 20 mg by mouth 2 (two) times daily. 10/28/19   [provider]  gabapentin (NEURONTIN) 300 MG capsule Take 300 mg by mouth 3 (three) times daily. 07/13/20   [provider]  levocetirizine Elita Boone)  5 MG tablet Take 5 mg by mouth every evening. 12/02/19   [provider]  montelukast (SINGULAIR) 10 MG tablet Take 10 mg by mouth at bedtime.    [provider]  ondansetron (ZOFRAN ODT) 4 MG disintegrating tablet Take 1 tablet (4 mg total) by mouth every 8 (eight) hours as needed for nausea or vomiting. 07/18/20   Sabas Sous, MD  pantoprazole (PROTONIX) 40 MG tablet Take 40 mg by mouth 2 (two) times daily before a meal. 12/23/13   [provider]  Rivaroxaban (XARELTO) 15 MG TABS tablet Take 1 tablet (15 mg total) by mouth daily with supper for  1 dose. 08/10/22 08/11/22  Tegeler, Canary Brim, MD  RIVAROXABAN Carlena Hurl) VTE STARTER PACK (15 & 20 MG) Follow package directions: Take one 15mg  tablet by mouth twice a day. On day 22, switch to one 20mg  tablet once a day. Take with food. 08/09/22   Tyrone Nine, MD  spironolactone (ALDACTONE) 25 MG tablet Take 1 tablet (25 mg total) by mouth daily. 06/27/22   Shalhoub, Deno Lunger, MD  sucralfate (CARAFATE) 1 g tablet Take 1 g by mouth 4 (four) times daily. 07/22/19   [provider]  valACYclovir (VALTREX) 500 MG tablet Take 500 mg by mouth daily as needed (breakout). 08/21/17   [provider]  enoxaparin (LOVENOX) 120 MG/0.8ML SOLN Inject 0.8 mLs (120 mg total) into the skin every 12 (twelve) hours. 11/30/10 04/16/11  Daryel Gerald, MD  loratadine (CLARITIN) 10 MG tablet Take 10 mg by mouth daily.    04/16/11  [provider]      Allergies    Heparin, Amitriptyline hcl, Azithromycin, Fentanyl, Cymbalta [duloxetine hcl], and Neomycin-bacitracin zn-polymyx    Review of Systems   Review of Systems  All other systems reviewed and are negative.   Physical Exam Updated Vital Signs BP 128/83   Pulse 68   Temp 97.6 F (36.4 C)   Resp (!) 22   Ht 5\' 4"  (1.626 m)   Wt 106.6 kg   SpO2 100%   BMI 40.34 kg/m  Physical Exam Vitals and nursing note reviewed.  Constitutional:      Appearance: She is well-developed.  HENT:     Head: Normocephalic and atraumatic.  Cardiovascular:     Rate and Rhythm: Normal rate and regular rhythm.     Heart sounds: No murmur heard. Pulmonary:     Effort: Pulmonary effort is normal. No respiratory distress.     Breath sounds: Normal breath sounds.  Abdominal:     Palpations: Abdomen is soft.     Tenderness: There is no abdominal tenderness. There is no guarding or rebound.  Musculoskeletal:        General: No tenderness.  Skin:    General: Skin is warm and dry.  Neurological:     Mental Status: She is alert.      Comments: Oriented to person, hospital.  Moves all extremities symmetrically.  Confused.  She is hyperactive, mildly agitated.  She is taking her close on and off repeatedly     ED Results / Procedures / Treatments   Labs (all labs ordered are listed, but only abnormal results are displayed) Labs Reviewed  COMPREHENSIVE METABOLIC PANEL - Abnormal; Notable for the following components:      Result Value   Sodium 134 (*)    Potassium 3.3 (*)    Creatinine, Ser 1.33 (*)    Total Protein 8.4 (*)    AST 51 (*)  GFR, Estimated 44 (*)    All other components within normal limits  CBC WITH DIFFERENTIAL/PLATELET - Abnormal; Notable for the following components:   RDW 19.3 (*)    All other components within normal limits  URINALYSIS, W/ REFLEX TO CULTURE (INFECTION SUSPECTED) - Abnormal; Notable for the following components:   Color, Urine STRAW (*)    Specific Gravity, Urine 1.004 (*)    Hgb urine dipstick SMALL (*)    Protein, ur 30 (*)    All other components within normal limits  RAPID URINE DRUG SCREEN, HOSP PERFORMED - Abnormal; Notable for the following components:   Benzodiazepines POSITIVE (*)    All other components within normal limits  ETHANOL  AMMONIA  BLOOD GAS, VENOUS  MAGNESIUM  OSMOLALITY  SODIUM, URINE, RANDOM  OSMOLALITY, URINE    EKG EKG Interpretation Date/Time:  Thursday August 24 2022 03:37:31 EDT Ventricular Rate:  78 PR Interval:  153 QRS Duration:  107 QT Interval:  409 QTC Calculation: 466 R Axis:   -35  Text Interpretation: Sinus rhythm Left axis deviation Borderline repolarization abnormality Confirmed by Tilden Fossa [phone]) on 08/24/2022 4:24:48 AM  Radiology CT ABDOMEN PELVIS W CONTRAST  Result Date: 08/24/2022 CLINICAL DATA:  Abdominal pain.  History of hematuria and cirrhosis. EXAM: CT ABDOMEN AND PELVIS WITH CONTRAST TECHNIQUE: Multidetector CT imaging of the abdomen and pelvis was performed using the standard protocol following bolus  administration of intravenous contrast. RADIATION DOSE REDUCTION: This exam was performed according to the departmental dose-optimization program which includes automated exposure control, adjustment of the mA and/or kV according to patient size and/or use of iterative reconstruction technique. CONTRAST:  75mL OMNIPAQUE IOHEXOL 300 MG/ML  SOLN COMPARISON:  06/26/2022 FINDINGS: Lower chest: Scar versus subsegmental atelectasis noted in the lung bases. No pleural fluid or airspace consolidation. Hepatobiliary: The liver has a cirrhotic morphology with a diffuse nodular shrunken appearance. No focal liver abnormality noted. Gallbladder appears normal. No bile duct dilatation. Pancreas: Unremarkable. No pancreatic ductal dilatation or surrounding inflammatory changes. Spleen: Spleen measures 14 cm in length.  No focal splenic lesion. Adrenals/Urinary Tract: Normal adrenal glands. Bilateral nonobstructing renal calculi. The largest stone is in the upper pole of left kidney measuring 5 mm, image 38/3. Cyst off the upper pole of the right kidney is again seen measuring 5.4 cm. No follow-up imaging recommended. No signs of hydronephrosis or hydroureter bilaterally. The urinary bladder appears within normal limits. Stomach/Bowel: Stomach is normal. The appendix is within normal limits. No pathologic dilatation of the large or small bowel loops. No bowel wall thickening or inflammation identified Vascular/Lymphatic: Aortic atherosclerosis. No aneurysm. No abdominopelvic adenopathy. Reproductive: Status post hysterectomy. No adnexal masses. Other: No free fluid or fluid collections. No signs of pneumoperitoneum. No ventral abdominal wall hernia identified. Musculoskeletal: No acute or significant osseous findings. Multilevel lumbar spondylosis scratch multilevel lumbar degenerative disc disease. 6 mm anterolisthesis of L4 on L5. No acute or suspicious osseous lesions. IMPRESSION: 1. No acute findings in the abdomen or pelvis.  No specific findings to explain the patient's history of abdominal pain. 2. Cirrhosis with splenomegaly. No ascites. 3. Bilateral nonobstructing renal calculi. 4. Aortic Atherosclerosis (ICD10-I70.0). Electronically Signed   By: Signa Kell M.D.   On: 08/24/2022 05:28   CT Head Wo Contrast  Result Date: 08/24/2022 CLINICAL DATA:  Confusion EXAM: CT HEAD WITHOUT CONTRAST TECHNIQUE: Contiguous axial images were obtained from the base of the skull through the vertex without intravenous contrast. RADIATION DOSE REDUCTION: This exam was performed according  to the departmental dose-optimization program which includes automated exposure control, adjustment of the mA and/or kV according to patient size and/or use of iterative reconstruction technique. COMPARISON:  08/17/2022 FINDINGS: Brain: No evidence of acute infarction, hemorrhage, hydrocephalus, extra-axial collection or mass lesion/mass effect. Mild low-density in the cerebral white matter, likely chronic small vessel ischemia. Vascular: No hyperdense vessel or unexpected calcification. Skull: Normal. Negative for fracture or focal lesion. Sinuses/Orbits: No acute finding. IMPRESSION: No new or acute finding. Electronically Signed   By: Tiburcio Pea M.D.   On: 08/24/2022 05:09   DG Chest Port 1 View  Result Date: 08/24/2022 CLINICAL DATA:  Altered mental status. EXAM: PORTABLE CHEST 1 VIEW COMPARISON:  August 17, 2022 FINDINGS: The study is limited secondary to patient rotation. The heart size and mediastinal contours are within normal limits. Mild left infrahilar atelectasis is seen. No pleural effusion or pneumothorax is identified. Multilevel degenerative changes seen throughout the thoracic spine. IMPRESSION: Mild left infrahilar atelectasis. Electronically Signed   By: Aram Candela M.D.   On: 08/24/2022 01:50    Procedures Procedures    Medications Ordered in ED Medications  entecavir (BARACLUDE) tablet 0.5 mg (has no administration in time  range)  carvedilol (COREG) tablet 12.5 mg (has no administration in time range)  spironolactone (ALDACTONE) tablet 25 mg (has no administration in time range)  docusate sodium (COLACE) capsule 100 mg (has no administration in time range)  pantoprazole (PROTONIX) EC tablet 40 mg (has no administration in time range)  Rivaroxaban (XARELTO) tablet 15 mg (has no administration in time range)  rivaroxaban (XARELTO) tablet 20 mg (has no administration in time range)  buPROPion (WELLBUTRIN XL) 24 hr tablet 150 mg (has no administration in time range)  doxepin (SINEQUAN) capsule 100 mg (has no administration in time range)  albuterol (VENTOLIN HFA) 108 (90 Base) MCG/ACT inhaler 2 puff (has no administration in time range)  mometasone-formoterol (DULERA) 200-5 MCG/ACT inhaler 2 puff (has no administration in time range)  montelukast (SINGULAIR) tablet 10 mg (has no administration in time range)  sodium chloride flush (NS) 0.9 % injection 3 mL (has no administration in time range)  sodium chloride flush (NS) 0.9 % injection 3 mL (has no administration in time range)  0.9 %  sodium chloride infusion (has no administration in time range)  acetaminophen (TYLENOL) tablet 650 mg (has no administration in time range)    Or  acetaminophen (TYLENOL) suppository 650 mg (has no administration in time range)  ondansetron (ZOFRAN) tablet 4 mg (has no administration in time range)    Or  ondansetron (ZOFRAN) injection 4 mg (has no administration in time range)  hydrALAZINE (APRESOLINE) injection 10 mg (has no administration in time range)  ALPRAZolam (XANAX) tablet 1 mg (has no administration in time range)  haloperidol lactate (HALDOL) injection 5 mg (5 mg Intramuscular Given 08/24/22 0221)  iohexol (OMNIPAQUE) 300 MG/ML solution 75 mL (75 mLs Intravenous Contrast Given 08/24/22 0436)  LORazepam (ATIVAN) injection 0.5 mg (0.5 mg Intravenous Given 08/24/22 0637)  potassium chloride (KLOR-CON) packet 40 mEq (40 mEq Oral  Given 08/24/22 3875)    ED Course/ Medical Decision Making/ A&P                                 Medical Decision Making Amount and/or Complexity of Data Reviewed Labs: ordered. Radiology: ordered.  Risk Prescription drug management. Decision regarding hospitalization.   Patient brought in by EMS for  evaluation of altered mental status per report.  Patient with numerous complaints that are difficult to follow.  She is agitated, at times appears to be hallucinating.  She does have a history of cirrhosis.  She did require haloperidol for her agitation to expedite her workup.  CT head is without acute abnormality.  She was complaining of some abdominal pain-CT abdomen pelvis without clear source of her pain.  Labs are near her baseline.  On record review she did recently see her PCP and they had concern for polypharmacy.  Plan to admit for altered mental status, delirium/encephalopathy.  Hospitalist consulted for admission.  After call for admission daughter called and stated that Rayona has been very confused for the last 24 to 48 hours and this is an abrupt change in her behavior.  Daughter describes behavior similar to what was witnessed in the emergency department.  Updated daughter of findings of studies at this point and plan to admit for ongoing workup.       Final Clinical Impression(s) / ED Diagnoses Final diagnoses:  Acute encephalopathy    Rx / DC Orders ED Discharge Orders     None         Tilden Fossa, MD 08/24/22 [phone]

## 2022-08-24 NOTE — Evaluation (Signed)
Physical Therapy Evaluation Patient Details Name: [name] MRN: [id number] DOB: 1955-10-30 Today's Date: 08/24/2022  History of Present Illness  Pt is a 67 year old female presenting to ED for AMS.  PMHx including but not limited to COPD not on oxygen, chronic pain/OA on opiate, hepatitis B, DVT/PE on Xarelto, diastolic CHF, HTN, anxiety, bipolar disorder, CKD-3 and morbid obesity.  Clinical Impression  Pt admitted with above diagnosis.  Pt currently with functional limitations due to the deficits listed below (see PT Problem List). Pt will benefit from acute skilled PT to increase their independence and safety with mobility to allow discharge.  Pt admitted with AMS however cognition appearing appropriate during time of session.  Pt assisted with ambulating however distance limited by Rt leg pain.  Rt lower leg appears red and edematous compared to Lt LE.   Pt reports pain, swelling, redness have been present for couple weeks.  Imaging of right tib/fib and foot on 7/17 (ED visit) are negative for acute fracture.  Pt uncertain if she has RW at home and would benefit from RW upon d/c to assist with pain control.         If plan is discharge home, recommend the following: A little help with walking and/or transfers;A little help with bathing/dressing/bathroom;Help with stairs or ramp for entrance   Can travel by private vehicle        Equipment Recommendations Rolling walker (2 wheels)  Recommendations for Other Services       Functional Status Assessment Patient has had a recent decline in their functional status and demonstrates the ability to make significant improvements in function in a reasonable and predictable amount of time.     Precautions / Restrictions Precautions Precautions: Fall      Mobility  Bed Mobility Overal bed mobility: Modified Independent                  Transfers Overall transfer level: Needs assistance Equipment used: Rolling walker (2  wheels) Transfers: Sit to/from Stand Sit to Stand: Min assist           General transfer comment: light assist to rise, cues for hand placement    Ambulation/Gait Ambulation/Gait assistance: Min guard Gait Distance (Feet): 30 Feet Assistive device: Rolling walker (2 wheels) Gait Pattern/deviations: Step-to pattern, Decreased stance time - right, Antalgic       General Gait Details: cues for sequence and use of UEs through RW to assist with pain control as pt reports painful weight bearing on Rt leg; pain also limited distance  Stairs            Wheelchair Mobility     Tilt Bed    Modified Rankin (Stroke Patients Only)       Balance Overall balance assessment: History of Falls (pt does not recall cause of falls)                                           Pertinent Vitals/Pain Pain Assessment Pain Assessment: Faces Faces Pain Scale: Hurts little more Pain Location: R lower leg Pain Descriptors / Indicators: Tender Pain Intervention(s): Repositioned, Monitored during session (RN notified of pain and redness/swelling in right lower leg)    Home Living Family/patient expects to be discharged to:: Private residence Living Arrangements: Children Available Help at Discharge: Family;Available PRN/intermittently Type of Home: House Home Access: Stairs to enter Entrance  Stairs-Rails: Right Entrance Stairs-Number of Steps: 3   Home Layout: Able to live on main level with bedroom/bathroom Home Equipment: Rolling Walker (2 wheels);Cane - single point Additional Comments: lives with her daughter, Whitney Post    Prior Function Prior Level of Function : Independent/Modified Independent               ADLs Comments: reports sometimes needing assist with pericare     Hand Dominance        Extremity/Trunk Assessment   Upper Extremity Assessment Upper Extremity Assessment: LUE deficits/detail LUE Deficits / Details: reports "torn" rotator cuff     Lower Extremity Assessment Lower Extremity Assessment: Overall WFL for tasks assessed;RLE deficits/detail RLE Deficits / Details: reports hx of shooting burning pain from previous back surgery, also painful right ankle with limited active PF/DF due to pain, redness observed lower leg as well as more edematous then Lt LE    Cervical / Trunk Assessment Cervical / Trunk Assessment: Normal  Communication   Communication: No difficulties  Cognition Arousal/Alertness: Awake/alert Behavior During Therapy: Flat affect Overall Cognitive Status: No family/caregiver present to determine baseline cognitive functioning                                 General Comments: admitted with AMS, appears appropriate during session        General Comments      Exercises     Assessment/Plan    PT Assessment Patient needs continued PT services  PT Problem List Decreased strength;Decreased activity tolerance;Decreased mobility;Pain;Decreased balance;Decreased knowledge of use of DME       PT Treatment Interventions DME instruction;Balance training;Gait training;Therapeutic exercise;Functional mobility training;Therapeutic activities;Patient/family education;Stair training    PT Goals (Current goals can be found in the Care Plan section)  Acute Rehab PT Goals PT Goal Formulation: With patient Time For Goal Achievement: 09/07/22 Potential to Achieve Goals: Good    Frequency Min 1X/week     Co-evaluation               AM-PAC PT "6 Clicks" Mobility  Outcome Measure Help needed turning from your back to your side while in a flat bed without using bedrails?: A Little Help needed moving from lying on your back to sitting on the side of a flat bed without using bedrails?: A Little Help needed moving to and from a bed to a chair (including a wheelchair)?: A Little Help needed standing up from a chair using your arms (e.g., wheelchair or bedside chair)?: A Little Help needed to  walk in hospital room?: A Lot Help needed climbing 3-5 steps with a railing? : A Lot 6 Click Score: 16    End of Session Equipment Utilized During Treatment: Gait belt Activity Tolerance: Patient tolerated treatment well Patient left: in chair;with call bell/phone within reach Nurse Communication: Mobility status (aware pt is not on chair alarm) PT Visit Diagnosis: Other abnormalities of gait and mobility (R26.89)    Time: 8295-6213 PT Time Calculation (min) (ACUTE ONLY): 20 min   Charges:   PT Evaluation $PT Eval Low Complexity: 1 Low   PT General Charges $$ ACUTE PT VISIT: 1 Visit        Paulino Door, DPT Physical Therapist Acute Rehabilitation Services Office: [phone]   Janan Halter [place] 08/24/2022, 2:46 PM

## 2022-08-25 DIAGNOSIS — G9341 Metabolic encephalopathy: Secondary | ICD-10-CM | POA: Diagnosis not present

## 2022-08-25 LAB — LDL CHOLESTEROL, DIRECT: Direct LDL: 101 mg/dL — ABNORMAL HIGH (ref 0–99)

## 2022-08-25 MED ORDER — BISACODYL 5 MG PO TBEC
5.0000 mg | DELAYED_RELEASE_TABLET | Freq: Every day | ORAL | Status: DC | PRN
  Administered 2022-08-25: 5 mg via ORAL
  Filled 2022-08-25: qty 1

## 2022-08-25 NOTE — Plan of Care (Signed)

## 2022-08-25 NOTE — Plan of Care (Signed)
  Problem: Education: Goal: Knowledge of General Education information will improve Description: Including pain rating scale, medication(s)/side effects and non-pharmacologic comfort measures Outcome: Progressing   Problem: Health Behavior/Discharge Planning: Goal: Ability to manage health-related needs will improve Outcome: Progressing   Problem: Clinical Measurements: Goal: Ability to maintain clinical measurements within normal limits will improve Outcome: Progressing Goal: Will remain free from infection Outcome: Progressing Goal: Diagnostic test results will improve Outcome: Progressing Goal: Respiratory complications will improve Outcome: Progressing Goal: Cardiovascular complication will be avoided Outcome: Progressing   Problem: Activity: Goal: Risk for activity intolerance will decrease Outcome: Progressing   Problem: Nutrition: Goal: Adequate nutrition will be maintained Outcome: Progressing   Problem: Coping: Goal: Level of anxiety will decrease Outcome: Progressing   Problem: Elimination: Goal: Will not experience complications related to bowel motility Outcome: Progressing Goal: Will not experience complications related to urinary retention Outcome: Progressing   Problem: Pain Managment: Goal: General experience of comfort will improve Outcome: Progressing   Problem: Safety: Goal: Ability to remain free from injury will improve Outcome: Progressing   Problem: Skin Integrity: Goal: Risk for impaired skin integrity will decrease Outcome: Progressing  Pt A/Ox4, forgetful at times, on RA. C/o R lower extremity pain, +1 edema, warm. PRN xanax administered for anxiety.

## 2022-08-25 NOTE — Discharge Summary (Signed)
Physician Discharge Summary   Patient: [name] MRN: [id number] DOB: [birth year]  Admit date:     08/23/2022  Discharge date: {dischdate:26783}  Discharge Physician: Jacquelin Hawking   PCP: Tracey Harries, MD   Recommendations at discharge:  {Tip this will not be part of the note when signed- Example include specific recommendations for outpatient follow-up, pending tests to follow-up on. (Optional):26781}  ***  Discharge Diagnoses: Principal Problem:   Acute metabolic encephalopathy Active Problems:   Generalized anxiety disorder   DVT (deep venous thrombosis) on Xarelto (HCC)   Chronic depression   Bipolar disorder (HCC)   Essential hypertension   Chronic kidney disease (CKD), stage III (moderate) (HCC)   Chronic diastolic CHF (congestive heart failure) (HCC)   Hepatitis B related cirrhosis (HCC)   Hyponatremia   Hypokalemia   Reactive airway disease  Resolved Problems:   * No resolved hospital problems. [facility] Course: No notes on file  Assessment and Plan: No notes have been filed under this hospital service. Service: Hospitalist     {Tip this will not be part of the note when signed Body mass index is 43.82 kg/m. , ,  (Optional):26781}  {(NOTE) Pain control PDMP Statment (Optional):26782} Consultants: *** Procedures performed: ***  Disposition: {Plan; Disposition:26390} Diet recommendation:  {Diet_Plan:26776} DISCHARGE MEDICATION: Allergies as of 08/25/2022       Reactions   Heparin Hives   Pt states she cannot take because she will break out in hives   Amitriptyline Hcl Other (See Comments)   Causes her to be very "disoriented".   Azithromycin Rash   Fentanyl Other (See Comments)   Sees things Pt stated had hallucinations "d/t taking high dosage, but can tolerate low dosage"   Cymbalta [duloxetine Hcl] Other (See Comments)   Headaches   Neomycin-bacitracin Zn-polymyx Rash        Medication List     STOP taking these medications     baclofen 20 MG tablet Commonly known as: LIORESAL   HYDROcodone-acetaminophen 5-325 MG tablet Commonly known as: NORCO/VICODIN       TAKE these medications    albuterol 108 (90 Base) MCG/ACT inhaler Commonly known as: ProAir HFA INHALE 2 PUFFS INTO THE LUNGS EVERY 4 HOURS AS NEEDED FOR WHEEZING What changed:  how much to take how to take this when to take this reasons to take this additional instructions   albuterol (2.5 MG/3ML) 0.083% nebulizer solution Commonly known as: PROVENTIL Take 3 mLs (2.5 mg total) by nebulization every 4 (four) hours as needed for wheezing or shortness of breath. What changed: when to take this   ALPRAZolam 1 MG tablet Commonly known as: XANAX Take 1 tablet (1 mg total) by mouth 3 (three) times daily. What changed:  when to take this reasons to take this   budesonide-formoterol 160-4.5 MCG/ACT inhaler Commonly known as: SYMBICORT Inhale 2 puffs into the lungs 2 (two) times daily. What changed: when to take this   buPROPion 150 MG 24 hr tablet Commonly known as: WELLBUTRIN XL Take 150 mg by mouth every morning.   carvedilol 12.5 MG tablet Commonly known as: COREG Take 1 tablet (12.5 mg total) by mouth 2 (two) times daily with a meal.   clotrimazole-betamethasone cream Commonly known as: LOTRISONE Apply to affected area 2 times daily prn What changed:  how much to take how to take this when to take this reasons to take this additional instructions   docusate sodium 100 MG capsule Commonly known as: COLACE Take 100 mg  by mouth 2 (two) times daily.   doxepin 100 MG capsule Commonly known as: SINEQUAN Take 100 mg by mouth at bedtime.   entecavir 0.5 MG tablet Commonly known as: BARACLUDE Take 0.5 mg by mouth every other day.   famotidine 20 MG tablet Commonly known as: PEPCID Take 20 mg by mouth 2 (two) times daily.   FeroSul 325 (65 FE) MG tablet Generic drug: ferrous sulfate Take 325 mg by mouth daily.    furosemide 20 MG tablet Commonly known as: LASIX Take 20 mg by mouth daily.   gabapentin 300 MG capsule Commonly known as: NEURONTIN Take 300 mg by mouth 3 (three) times daily.   levocetirizine 5 MG tablet Commonly known as: XYZAL Take 5 mg by mouth every evening.   montelukast 10 MG tablet Commonly known as: SINGULAIR Take 10 mg by mouth daily.   naloxone 4 MG/0.1ML Liqd nasal spray kit Commonly known as: NARCAN Place into the nose.   nystatin powder Generic drug: nystatin Apply 1 Application topically as needed (yeast).   ondansetron 8 MG disintegrating tablet Commonly known as: ZOFRAN-ODT Take 8 mg by mouth every 8 (eight) hours as needed for nausea or vomiting.   pantoprazole 40 MG tablet Commonly known as: PROTONIX Take 40 mg by mouth 2 (two) times daily before a meal.   Rivaroxaban Starter Pack (15 mg and 20 mg) Commonly known as: XARELTO STARTER PACK Follow package directions: Take one 15mg  tablet by mouth twice a day. On day 22, switch to one 20mg  tablet once a day. Take with food. What changed: Another medication with the same name was removed. Continue taking this medication, and follow the directions you see here.   spironolactone 25 MG tablet Commonly known as: ALDACTONE Take 1 tablet (25 mg total) by mouth daily.   sucralfate 1 g tablet Commonly known as: CARAFATE Take 1 g by mouth 4 (four) times daily.   valACYclovir 500 MG tablet Commonly known as: VALTREX Take 500 mg by mouth daily as needed (breakout).   Vitamin D3 50 MCG (2000 UT) Tabs Take 2,000 Units by mouth daily.        Follow-up Information     Tracey Harries, MD. Schedule an appointment as soon as possible for a visit in 1 week(s).   Specialty: Family Medicine Why: For hospital follow-up Contact information: [address] [place] [address] [phone]         [facility]. Schedule an appointment as soon as possible for a visit.    Contact information: [address]     [address] [place] [address] [phone]               Discharge Exam: [name]   08/23/22 2300 08/25/22 0500  Weight: 106.6 kg 115.8 kg   ***  Condition at discharge: {DC Condition:26389}  The results of significant diagnostics from this hospitalization (including imaging, microbiology, ancillary and laboratory) are listed below for reference.   Imaging Studies: MR ANGIO HEAD WO CONTRAST  Result Date: 08/24/2022 CLINICAL DATA:  Provided history: Mental status change, unknown cause. Altered mentation. EXAM: MRI HEAD WITHOUT CONTRAST MRA HEAD WITHOUT CONTRAST TECHNIQUE: Multiplanar, multi-echo pulse sequences of the brain and surrounding structures were acquired without intravenous contrast. Angiographic images of the Circle of Willis were acquired using MRA technique without intravenous contrast. COMPARISON:  Head CT 08/24/2022. Report from brain MRI 07/11/2018 (images unavailable). FINDINGS: MRI HEAD FINDINGS Brain: No age advanced or lobar predominant parenchymal atrophy.  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 Multifocal T2 FLAIR hyperintense signal abnormality within the cerebral white matter and pons, nonspecific but compatible with mild chronic small vessel ischemic disease. There is no acute infarct. No evidence of an intracranial mass. No chronic intracranial blood products. No extra-axial fluid collection. No midline shift. Vascular: Maintained flow voids within the proximal large arterial vessels. Skull and upper cervical spine: No focal suspicious marrow lesion. Incompletely assessed cervical spondylosis. Sinuses/Orbits: No mass or acute finding within the imaged orbits. Minimal mucosal thickening within the bilateral maxillary sinuses. Other: Trace fluid within the bilateral mastoid air cells. MRA HEAD FINDINGS Anterior circulation: The intracranial internal carotid  arteries are patent. The M1 middle cerebral arteries are patent. No M2 proximal branch occlusion is identified. The anterior cerebral arteries are patent. No intracranial aneurysm is identified. Posterior circulation: The intracranial vertebral arteries are patent. The non-dominant right vertebral artery terminates as the right PICA. Apparent severe stenosis within the proximal left PICA (series 1042, image 3). The basilar artery is patent. The posterior cerebral arteries are patent. Posterior communicating arteries are diminutive or absent, bilaterally. Anatomic variants: As described. IMPRESSION: MRI brain: 1.  No evidence of an acute intracranial abnormality. 2. 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 3. Mild chronic small vessel ischemic changes within the cerebral white matter and pons. MRA head: 1. No intracranial large vessel occlusion. 2. 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 Electronically Signed   By: Jackey Loge D.O.   On: 08/24/2022 08:27   MR BRAIN WO CONTRAST  Result Date: 08/24/2022 CLINICAL DATA:  Provided history: Mental status change, unknown cause. Altered mentation. EXAM: MRI HEAD WITHOUT CONTRAST MRA HEAD WITHOUT CONTRAST TECHNIQUE: Multiplanar, multi-echo pulse sequences of the brain and surrounding structures were acquired without intravenous contrast. Angiographic images of the Circle of Willis were acquired using MRA technique without intravenous contrast. COMPARISON:  Head CT 08/24/2022. Report from brain MRI 07/11/2018 (images unavailable). FINDINGS: MRI HEAD FINDINGS Brain: No age advanced or lobar predominant parenchymal atrophy. 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 Multifocal T2 FLAIR hyperintense signal abnormality within the cerebral white matter and pons, nonspecific but compatible with mild chronic small vessel ischemic disease. There is no acute infarct. No  evidence of an intracranial mass. No chronic intracranial blood products. No extra-axial fluid collection. No midline shift. Vascular: Maintained flow voids within the proximal large arterial vessels. Skull and upper cervical spine: No focal suspicious marrow lesion. Incompletely assessed cervical spondylosis. Sinuses/Orbits: No mass or acute finding within the imaged orbits. Minimal mucosal thickening within the bilateral maxillary sinuses. Other: Trace fluid within the bilateral mastoid air cells. MRA HEAD FINDINGS Anterior circulation: The intracranial internal carotid arteries are patent. The M1 middle cerebral arteries are patent. No M2 proximal branch occlusion is identified. The anterior cerebral arteries are patent. No intracranial aneurysm is identified. Posterior circulation: The intracranial vertebral arteries are patent. The non-dominant right vertebral artery terminates as the right PICA. Apparent severe stenosis within the proximal left PICA (series 1042, image 3). The basilar artery is patent. The posterior cerebral arteries are patent. Posterior communicating arteries are diminutive or absent, bilaterally. Anatomic variants: As described. IMPRESSION: MRI brain: 1.  No evidence of an acute intracranial abnormality. 2. 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 3. Mild chronic small vessel ischemic changes within the cerebral white matter and pons. MRA head: 1. No intracranial large vessel occlusion.  2. 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 Electronically Signed   By: Jackey Loge D.O.   On: 08/24/2022 08:27   CT ABDOMEN PELVIS W CONTRAST  Result Date: 08/24/2022 CLINICAL DATA:  Abdominal pain.  History of hematuria and cirrhosis. EXAM: CT ABDOMEN AND PELVIS WITH CONTRAST TECHNIQUE: Multidetector CT imaging of the abdomen and pelvis was performed using the standard protocol following bolus administration of intravenous  contrast. RADIATION DOSE REDUCTION: This exam was performed according to the departmental dose-optimization program which includes automated exposure control, adjustment of the mA and/or kV according to patient size and/or use of iterative reconstruction technique. CONTRAST:  75mL OMNIPAQUE IOHEXOL 300 MG/ML  SOLN COMPARISON:  06/26/2022 FINDINGS: Lower chest: Scar versus subsegmental atelectasis noted in the lung bases. No pleural fluid or airspace consolidation. Hepatobiliary: The liver has a cirrhotic morphology with a diffuse nodular shrunken appearance. No focal liver abnormality noted. Gallbladder appears normal. No bile duct dilatation. Pancreas: Unremarkable. No pancreatic ductal dilatation or surrounding inflammatory changes. Spleen: Spleen measures 14 cm in length.  No focal splenic lesion. Adrenals/Urinary Tract: Normal adrenal glands. Bilateral nonobstructing renal calculi. The largest stone is in the upper pole of left kidney measuring 5 mm, image 38/3. Cyst off the upper pole of the right kidney is again seen measuring 5.4 cm. No follow-up imaging recommended. No signs of hydronephrosis or hydroureter bilaterally. The urinary bladder appears within normal limits. Stomach/Bowel: Stomach is normal. The appendix is within normal limits. No pathologic dilatation of the large or small bowel loops. No bowel wall thickening or inflammation identified Vascular/Lymphatic: Aortic atherosclerosis. No aneurysm. No abdominopelvic adenopathy. Reproductive: Status post hysterectomy. No adnexal masses. Other: No free fluid or fluid collections. No signs of pneumoperitoneum. No ventral abdominal wall hernia identified. Musculoskeletal: No acute or significant osseous findings. Multilevel lumbar spondylosis scratch multilevel lumbar degenerative disc disease. 6 mm anterolisthesis of L4 on L5. No acute or suspicious osseous lesions. IMPRESSION: 1. No acute findings in the abdomen or pelvis. No specific findings to  explain the patient's history of abdominal pain. 2. Cirrhosis with splenomegaly. No ascites. 3. Bilateral nonobstructing renal calculi. 4. Aortic Atherosclerosis (ICD10-I70.0). Electronically Signed   By: Signa Kell M.D.   On: 08/24/2022 05:28   CT Head Wo Contrast  Result Date: 08/24/2022 CLINICAL DATA:  Confusion EXAM: CT HEAD WITHOUT CONTRAST TECHNIQUE: Contiguous axial images were obtained from the base of the skull through the vertex without intravenous contrast. RADIATION DOSE REDUCTION: This exam was performed according to the departmental dose-optimization program which includes automated exposure control, adjustment of the mA and/or kV according to patient size and/or use of iterative reconstruction technique. COMPARISON:  08/17/2022 FINDINGS: Brain: No evidence of acute infarction, hemorrhage, hydrocephalus, extra-axial collection or mass lesion/mass effect. Mild low-density in the cerebral white matter, likely chronic small vessel ischemia. Vascular: No hyperdense vessel or unexpected calcification. Skull: Normal. Negative for fracture or focal lesion. Sinuses/Orbits: No acute finding. IMPRESSION: No new or acute finding. Electronically Signed   By: Tiburcio Pea M.D.   On: 08/24/2022 05:09   DG Chest Port 1 View  Result Date: 08/24/2022 CLINICAL DATA:  Altered mental status. EXAM: PORTABLE CHEST 1 VIEW COMPARISON:  August 17, 2022 FINDINGS: The study is limited secondary to patient rotation. The heart size and mediastinal contours are within normal limits. Mild left infrahilar atelectasis is seen. No pleural effusion or pneumothorax is identified. Multilevel degenerative changes seen throughout the thoracic spine. IMPRESSION: Mild left infrahilar atelectasis. Electronically Signed   By: Waylan Rocher  Houston M.D.   On: 08/24/2022 01:50   DG Chest Portable 1 View  Result Date: 08/17/2022 CLINICAL DATA:  Cough, congestion, pneumonia EXAM: PORTABLE CHEST 1 VIEW COMPARISON:  Prior chest x-ray  08/09/2022 FINDINGS: Stable enlargement the cardiac mediastinal contours. Extensive metallic artifact overlies the chest from numerous cardiac leads pooled in the middle of the chest. Nonspecific patchy airspace opacities in both lower lobes without significant interval change. No large effusion. No pneumothorax. IMPRESSION: Grossly stable chest x-ray with bibasilar patchy airspace opacities that may reflect atelectasis and/or infiltrate. Electronically Signed   By: Malachy Moan M.D.   On: 08/17/2022 07:40   CT Head Wo Contrast  Result Date: 08/17/2022 CLINICAL DATA:  68 year old female with history of trauma from a fall. EXAM: CT HEAD WITHOUT CONTRAST CT CERVICAL SPINE WITHOUT CONTRAST TECHNIQUE: Multidetector CT imaging of the head and cervical spine was performed following the standard protocol without intravenous contrast. Multiplanar CT image reconstructions of the cervical spine were also generated. RADIATION DOSE REDUCTION: This exam was performed according to the departmental dose-optimization program which includes automated exposure control, adjustment of the mA and/or kV according to patient size and/or use of iterative reconstruction technique. COMPARISON:  Head CT 08/10/2022. FINDINGS: CT HEAD FINDINGS Brain: No evidence of acute infarction, hemorrhage, hydrocephalus, extra-axial collection or mass lesion/mass effect. Vascular: No hyperdense vessel or unexpected calcification. Skull: Normal. Negative for fracture or focal lesion. Sinuses/Orbits: No acute finding. Mild multifocal mucosal thickening in the right posterior ethmoid and right sphenoid sinuses. Other: None. CT CERVICAL SPINE FINDINGS Alignment: Normal. Skull base and vertebrae: No acute fracture. No primary bone lesion or focal pathologic process. Orthopedic fixation hardware in the left facets and lamina from C4-C7, presumably from prior hemilaminectomy. Soft tissues and spinal canal: No prevertebral fluid or swelling. No visible  canal hematoma. Disc levels: Mild multilevel degenerative disc disease, most pronounced at C5-C6 and C6-C7. Mild multilevel facet arthropathy. Upper chest: Mild scarring in the apex of the right upper lobe. Severe compression of the trachea during apparent expiration, indicative of severe tracheomalacia. Other: None. IMPRESSION: 1. No evidence of significant acute traumatic injury to the skull, brain or cervical spine. 2. The appearance of the brain is normal. 3. Multilevel degenerative disc disease and cervical spondylosis with postoperative changes in the cervical spine, as above. Electronically Signed   By: Trudie Reed M.D.   On: 08/17/2022 06:40   CT Cervical Spine Wo Contrast  Result Date: 08/17/2022 CLINICAL DATA:  67 year old female with history of trauma from a fall. EXAM: CT HEAD WITHOUT CONTRAST CT CERVICAL SPINE WITHOUT CONTRAST TECHNIQUE: Multidetector CT imaging of the head and cervical spine was performed following the standard protocol without intravenous contrast. Multiplanar CT image reconstructions of the cervical spine were also generated. RADIATION DOSE REDUCTION: This exam was performed according to the departmental dose-optimization program which includes automated exposure control, adjustment of the mA and/or kV according to patient size and/or use of iterative reconstruction technique. COMPARISON:  Head CT 08/10/2022. FINDINGS: CT HEAD FINDINGS Brain: No evidence of acute infarction, hemorrhage, hydrocephalus, extra-axial collection or mass lesion/mass effect. Vascular: No hyperdense vessel or unexpected calcification. Skull: Normal. Negative for fracture or focal lesion. Sinuses/Orbits: No acute finding. Mild multifocal mucosal thickening in the right posterior ethmoid and right sphenoid sinuses. Other: None. CT CERVICAL SPINE FINDINGS Alignment: Normal. Skull base and vertebrae: No acute fracture. No primary bone lesion or focal pathologic process. Orthopedic fixation hardware in  the left facets and lamina from C4-C7, presumably from prior hemilaminectomy.  Soft tissues and spinal canal: No prevertebral fluid or swelling. No visible canal hematoma. Disc levels: Mild multilevel degenerative disc disease, most pronounced at C5-C6 and C6-C7. Mild multilevel facet arthropathy. Upper chest: Mild scarring in the apex of the right upper lobe. Severe compression of the trachea during apparent expiration, indicative of severe tracheomalacia. Other: None. IMPRESSION: 1. No evidence of significant acute traumatic injury to the skull, brain or cervical spine. 2. The appearance of the brain is normal. 3. Multilevel degenerative disc disease and cervical spondylosis with postoperative changes in the cervical spine, as above. Electronically Signed   By: Trudie Reed M.D.   On: 08/17/2022 06:40   CT HEAD WO CONTRAST ( )  Result Date: 08/10/2022 CLINICAL DATA:  Mental status change with unknown cause. EXAM: CT HEAD WITHOUT CONTRAST TECHNIQUE: Contiguous axial images were obtained from the base of the skull through the vertex without intravenous contrast. RADIATION DOSE REDUCTION: This exam was performed according to the departmental dose-optimization program which includes automated exposure control, adjustment of the mA and/or kV according to patient size and/or use of iterative reconstruction technique. COMPARISON:  05/28/2021 FINDINGS: Brain: No evidence of acute infarction, hemorrhage, hydrocephalus, extra-axial collection or mass lesion/mass effect. Vascular: No hyperdense vessel or unexpected calcification. Skull: Normal. Negative for fracture or focal lesion. Sinuses/Orbits: No acute finding. Other: Intermittent streak artifact primarily from motion IMPRESSION: Stable, negative head CT. Electronically Signed   By: Tiburcio Pea M.D.   On: 08/10/2022 07:19   VAS [place] LOWER EXTREMITY VENOUS (DVT)  Result Date: 08/09/2022  Lower Venous DVT Study Patient Name:  [name]  Date of Exam:    08/09/2022 Medical Rec #: [id number]     Accession #:    [id number] Date of Birth: Jun 05, 1955    Patient Gender: F Patient Age:   54 years Exam Location:  [facility] Procedure:      VAS [place] LOWER EXTREMITY VENOUS (DVT) Referring Phys: TIMOTHY OPYD --------------------------------------------------------------------------------  Indications: RLE swelling, recently stopped xarelto, PE.  Comparison Study: Previous 06/25/22 negative. Performing Technologist: McKayla Maag RVT, [place]  Examination Guidelines: A complete evaluation includes B-mode imaging, spectral Doppler, color Doppler, and power Doppler as needed of all accessible portions of each vessel. Bilateral testing is considered an integral part of a complete examination. Limited examinations for reoccurring indications may be performed as noted. The reflux portion of the exam is performed with the patient in reverse Trendelenburg.  +---------+---------------+---------+-----------+----------+--------------+ RIGHT    CompressibilityPhasicitySpontaneityPropertiesThrombus Aging +---------+---------------+---------+-----------+----------+--------------+ CFV      Full           Yes      Yes                                 +---------+---------------+---------+-----------+----------+--------------+ SFJ      Full                                                        +---------+---------------+---------+-----------+----------+--------------+ FV Prox  Full                                                        +---------+---------------+---------+-----------+----------+--------------+  FV Mid   Full                                                        +---------+---------------+---------+-----------+----------+--------------+ FV DistalFull                                                        +---------+---------------+---------+-----------+----------+--------------+ PFV      Full                                                         +---------+---------------+---------+-----------+----------+--------------+ POP      Full           Yes      Yes                                 +---------+---------------+---------+-----------+----------+--------------+ PTV      Full                                                        +---------+---------------+---------+-----------+----------+--------------+ PERO     Full                                                        +---------+---------------+---------+-----------+----------+--------------+   +----+---------------+---------+-----------+----------+--------------+ LEFTCompressibilityPhasicitySpontaneityPropertiesThrombus Aging +----+---------------+---------+-----------+----------+--------------+ CFV Full           Yes      Yes                                 +----+---------------+---------+-----------+----------+--------------+ SFJ Full                                                        +----+---------------+---------+-----------+----------+--------------+     Summary: RIGHT: - There is no evidence of deep vein thrombosis in the lower extremity.  - No cystic structure found in the popliteal fossa.  LEFT: - No evidence of common femoral vein obstruction.  *See table(s) above for measurements and observations. Electronically signed by Coral Else MD on 08/09/2022 at 9:45:06 PM.    Final    CT Angio Chest Pulmonary Embolism (PE) W or WO Contrast  Result Date: 08/09/2022 CLINICAL DATA:  Pulmonary embolism suspected. Right leg swelling with tenderness to palpation. EXAM: CT ANGIOGRAPHY CHEST WITH CONTRAST TECHNIQUE: Multidetector CT imaging of the chest was performed using the standard protocol during bolus administration of intravenous contrast. Multiplanar CT  image reconstructions and MIPs were obtained to evaluate the vascular anatomy. RADIATION DOSE REDUCTION: This exam was performed according to the departmental dose-optimization program  which includes automated exposure control, adjustment of the mA and/or kV according to patient size and/or use of iterative reconstruction technique. CONTRAST:  75mL OMNIPAQUE IOHEXOL 350 MG/ML SOLN COMPARISON:  05/17/2020 FINDINGS: Cardiovascular: Satisfactory opacification of the pulmonary arteries to the segmental level. Occlusive filling defect within segmental branches of the right lower lobe. Asymmetric dilatation of the central left pulmonary artery measuring up to 3.6 cm which could be developmental given the asymmetry. No right heart dilatation. No cardiomegaly or pericardial effusion. Mediastinum/Nodes: No mass or adenopathy. Lungs/Pleura: Some degree of tracheal bronchomalacia with bronchial airway thickening and partial collapse of the trachea. No pulmonary infarct or pulmonary edema. Scarring or atelectasis to a mild degree in the bilateral lungs. Upper Abdomen: Known cirrhosis. No acute finding in the covered abdomen. Musculoskeletal: Spondylitic spurring.  No acute finding. Critical Value/emergent results were called by telephone at the time of interpretation on 08/09/2022 at 5:12 am to provider [facility] , who verbally acknowledged these results. Review of the MIP images confirms the above findings. IMPRESSION: 1. Acute occlusive pulmonary embolism to the right lower lobe affecting segmental branches. 2. 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 Bilateral atelectasis or scarring. 3. 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 4. Cirrhosis. Electronically Signed   By: Tiburcio Pea M.D.   On: 08/09/2022 05:12   DG Tibia/Fibula Right  Result Date: 08/09/2022 CLINICAL DATA:  Pain and swelling EXAM: RIGHT TIBIA AND FIBULA - 2 VIEW; RIGHT FOOT COMPLETE - 3+ VIEW COMPARISON:  Foot radiographs 08/24/2020 FINDINGS: No acute fracture or dislocation. 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  Calcaneal spurs. Soft tissues are radiographically unremarkable. IMPRESSION: No acute fracture or dislocation. Electronically Signed   By: Minerva Fester M.D.   On: 08/09/2022 01:30   DG Foot Complete Right  Result Date: 08/09/2022 CLINICAL DATA:  Pain and swelling EXAM: RIGHT TIBIA AND FIBULA - 2 VIEW; RIGHT FOOT COMPLETE - 3+ VIEW COMPARISON:  Foot radiographs 08/24/2020 FINDINGS: No acute fracture or dislocation. 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 Calcaneal spurs. Soft tissues are radiographically unremarkable. IMPRESSION: No acute fracture or dislocation. Electronically Signed   By: Minerva Fester M.D.   On: 08/09/2022 01:30   DG Chest Port 1 View  Result Date: 08/09/2022 CLINICAL DATA:  Chest pain and leg swelling EXAM: PORTABLE CHEST 1 VIEW COMPARISON:  Radiographs 06/25/2022 FINDINGS: Stable cardiomediastinal silhouette given patient rotation. Aortic atherosclerotic calcification. Left basilar atelectasis or infiltrates. No pleural effusion or pneumothorax. No displaced rib fractures. IMPRESSION: Left basilar atelectasis or infiltrates. Electronically Signed   By: Minerva Fester M.D.   On: 08/09/2022 01:27    Microbiology: Results for orders placed or performed during the hospital encounter of 06/24/22  MRSA Next Gen by PCR, Nasal     Status: None   Collection Time: 06/24/22  7:17 PM   Specimen: Nasal Mucosa; Nasal Swab  Result Value Ref Range Status   MRSA by PCR Next Gen NOT DETECTED NOT DETECTED Final    Comment: (NOTE) The GeneXpert MRSA Assay (FDA approved for NASAL specimens only), is one component of a comprehensive MRSA colonization surveillance program. It is not intended to diagnose MRSA infection nor to guide or monitor treatment for MRSA infections. Test performance is not FDA approved in patients less than 54 years old.  Performed at [facility], [address] [place], [place] [address]   Group A  Strep by PCR     Status: None   Collection Time: 06/25/22  3:48 PM   Specimen: Throat; Sterile Swab  Result Value Ref Range Status   Group A Strep by PCR NOT DETECTED NOT DETECTED Final    Comment: Performed at [facility], [address], [place], [place] [address]    Labs: CBC: Recent Labs  Lab 08/24/22 0210  WBC 9.4  NEUTROABS 7.2  HGB 12.9  HCT 40.4  MCV 87.1  PLT 167   Basic Metabolic Panel: Recent Labs  Lab 08/24/22 0210 08/25/22 0351  NA 134*  --   K 3.3* 4.0  CL 99  --   CO2 22  --   GLUCOSE 97  --   BUN 16  --   CREATININE 1.33*  --   CALCIUM 9.8  --   MG 1.9  --    Liver Function Tests: Recent Labs  Lab 08/24/22 0210  AST 51*  ALT 29  ALKPHOS 100  BILITOT 0.8  PROT 8.4*  ALBUMIN 4.2   CBG: No results for input(s): "GLUCAP" in the last 168 hours.  Discharge time spent: {LESS THAN/GREATER HYQM:57846} 30 minutes.  Signed: Jacquelin Hawking, MD [facility] 08/25/2022

## 2022-08-25 NOTE — Progress Notes (Signed)
Mobility Specialist - Progress Note   08/25/22 0851  Mobility  Activity Ambulated with assistance in hallway  Level of Assistance Contact guard assist, steadying assist  Assistive Device Front wheel walker  Distance Ambulated (ft) 75 ft  Range of Motion/Exercises Active  Activity Response Tolerated well  Mobility Referral Yes  $Mobility charge 1 Mobility  Mobility Specialist Start Time (ACUTE ONLY) 0840  Mobility Specialist Stop Time (ACUTE ONLY) 0849  Mobility Specialist Time Calculation (min) (ACUTE ONLY) 9 min   Pt received in chair and agreed to mobility. She endorsed pain in L shoulder, and R leg. Returned to chair with all needs met and MD in room.  Marilynne Halsted Mobility Specialist
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
## 2022-09-28 ENCOUNTER — Telehealth: Payer: Self-pay | Admitting: Surgical

## 2022-09-28 NOTE — Telephone Encounter (Signed)
Pt called requesting to submit to insurance company for gel injections for left shoulder and right knee. Please call pt with approval. Pt has an appt for 9/30 for cortisone but want to change to gel injection per approval. Will keep appt just incase gel injection approval comes before upcoming appt. Pt phone number is [phone].

## 2022-10-02 NOTE — Telephone Encounter (Signed)
Talked with patient to clarify which knee for gel injection. Resubmitted VOB for Durolane, right knee due to last submission being done in [place], 2024

## 2022-10-19 ENCOUNTER — Other Ambulatory Visit: Payer: Self-pay

## 2022-10-19 DIAGNOSIS — M1711 Unilateral primary osteoarthritis, right knee: Secondary | ICD-10-CM

## 2022-10-23 ENCOUNTER — Ambulatory Visit: Payer: 59 | Admitting: Surgical

## 2022-12-11 ENCOUNTER — Ambulatory Visit: Payer: 59 | Admitting: Surgical

## 2022-12-18 ENCOUNTER — Ambulatory Visit: Payer: 59 | Admitting: Diagnostic Neuroimaging

## 2022-12-25 ENCOUNTER — Ambulatory Visit: Payer: 59 | Admitting: Surgical

## 2023-01-08 ENCOUNTER — Ambulatory Visit: Payer: 59 | Admitting: Neurology

## 2023-01-13 ENCOUNTER — Emergency Department (HOSPITAL_COMMUNITY): Payer: 59

## 2023-01-13 ENCOUNTER — Emergency Department (HOSPITAL_COMMUNITY)
Admission: EM | Admit: 2023-01-13 | Discharge: 2023-01-14 | Disposition: A | Discharge status: Home / Self Care | Payer: 59 | Attending: Emergency Medicine | Admitting: Emergency Medicine

## 2023-01-13 ENCOUNTER — Other Ambulatory Visit: Payer: Self-pay

## 2023-01-13 ENCOUNTER — Encounter (HOSPITAL_COMMUNITY): Payer: Self-pay

## 2023-01-13 DIAGNOSIS — R030 Elevated blood-pressure reading, without diagnosis of hypertension: Secondary | ICD-10-CM | POA: Insufficient documentation

## 2023-01-13 DIAGNOSIS — R0602 Shortness of breath: Secondary | ICD-10-CM | POA: Insufficient documentation

## 2023-01-13 DIAGNOSIS — D696 Thrombocytopenia, unspecified: Secondary | ICD-10-CM | POA: Diagnosis not present

## 2023-01-13 DIAGNOSIS — R058 Other specified cough: Secondary | ICD-10-CM

## 2023-01-13 DIAGNOSIS — J40 Bronchitis, not specified as acute or chronic: Secondary | ICD-10-CM | POA: Insufficient documentation

## 2023-01-13 DIAGNOSIS — M25511 Pain in right shoulder: Secondary | ICD-10-CM | POA: Diagnosis not present

## 2023-01-13 DIAGNOSIS — S0990XA Unspecified injury of head, initial encounter: Secondary | ICD-10-CM | POA: Insufficient documentation

## 2023-01-13 DIAGNOSIS — W01198A Fall on same level from slipping, tripping and stumbling with subsequent striking against other object, initial encounter: Secondary | ICD-10-CM | POA: Diagnosis not present

## 2023-01-13 DIAGNOSIS — Z7901 Long term (current) use of anticoagulants: Secondary | ICD-10-CM | POA: Diagnosis not present

## 2023-01-13 DIAGNOSIS — Z20822 Contact with and (suspected) exposure to covid-19: Secondary | ICD-10-CM | POA: Diagnosis not present

## 2023-01-13 DIAGNOSIS — Z79899 Other long term (current) drug therapy: Secondary | ICD-10-CM | POA: Insufficient documentation

## 2023-01-13 DIAGNOSIS — I10 Essential (primary) hypertension: Secondary | ICD-10-CM | POA: Insufficient documentation

## 2023-01-13 DIAGNOSIS — R062 Wheezing: Secondary | ICD-10-CM

## 2023-01-13 LAB — CBC
HCT: 40 % (ref 36.0–46.0)
Hemoglobin: 12.5 g/dL (ref 12.0–15.0)
MCH: 28.4 pg (ref 26.0–34.0)
MCHC: 31.3 g/dL (ref 30.0–36.0)
MCV: 90.9 fL (ref 80.0–100.0)
Platelets: 113 10*3/uL — ABNORMAL LOW (ref 150–400)
RBC: 4.4 MIL/uL (ref 3.87–5.11)
RDW: 15.1 % (ref 11.5–15.5)
WBC: 6.1 10*3/uL (ref 4.0–10.5)
nRBC: 0 % (ref 0.0–0.2)

## 2023-01-13 LAB — BASIC METABOLIC PANEL
Anion gap: 9 (ref 5–15)
BUN: 20 mg/dL (ref 8–23)
CO2: 28 mmol/L (ref 22–32)
Calcium: 9.8 mg/dL (ref 8.9–10.3)
Chloride: 102 mmol/L (ref 98–111)
Creatinine, Ser: 1.18 mg/dL — ABNORMAL HIGH (ref 0.44–1.00)
GFR, Estimated: 51 mL/min — ABNORMAL LOW (ref >60)
Glucose, Bld: 94 mg/dL (ref 70–99)
Potassium: 4.4 mmol/L (ref 3.5–5.1)
Sodium: 139 mmol/L (ref 135–145)

## 2023-01-13 LAB — RESP PANEL BY RT-PCR (RSV, FLU A&B, COVID)  RVPGX2
Influenza A by PCR: NEGATIVE
Influenza B by PCR: NEGATIVE
Resp Syncytial Virus by PCR: NEGATIVE
SARS Coronavirus 2 by RT PCR: NEGATIVE

## 2023-01-13 LAB — BRAIN NATRIURETIC PEPTIDE: B Natriuretic Peptide: 56.5 pg/mL (ref 0.0–100.0)

## 2023-01-13 LAB — TROPONIN I (HIGH SENSITIVITY): Troponin I (High Sensitivity): 7 ng/L (ref <18)

## 2023-01-13 MED ORDER — OXYCODONE HCL 5 MG PO TABS
5.0000 mg | ORAL_TABLET | Freq: Once | ORAL | Status: AC
Start: 2023-01-13 — End: 2023-01-13
  Administered 2023-01-13: 5 mg via ORAL
  Filled 2023-01-13: qty 1

## 2023-01-13 MED ORDER — PREDNISONE 20 MG PO TABS: 60.0000 mg | ORAL_TABLET | Freq: Every day | ORAL | 0 refills | Status: DC

## 2023-01-13 MED ORDER — DOXYCYCLINE HYCLATE 100 MG PO CAPS: 100.0000 mg | ORAL_CAPSULE | Freq: Two times a day (BID) | ORAL | 0 refills | Status: DC

## 2023-01-13 MED ORDER — ALBUTEROL SULFATE (2.5 MG/3ML) 0.083% IN NEBU
5.0000 mg | INHALATION_SOLUTION | Freq: Once | RESPIRATORY_TRACT | Status: AC
  Administered 2023-01-13: 5 mg via RESPIRATORY_TRACT
  Filled 2023-01-13: qty 6

## 2023-01-13 MED ORDER — IPRATROPIUM BROMIDE 0.02 % IN SOLN
0.5000 mg | Freq: Once | RESPIRATORY_TRACT | Status: AC
  Administered 2023-01-13: 0.5 mg via RESPIRATORY_TRACT
  Filled 2023-01-13: qty 2.5

## 2023-01-13 MED ORDER — AMLODIPINE BESYLATE 5 MG PO TABS
5.0000 mg | ORAL_TABLET | Freq: Once | ORAL | Status: AC
Start: 2023-01-13 — End: 2023-01-13
  Administered 2023-01-13: 5 mg via ORAL
  Filled 2023-01-13: qty 1

## 2023-01-13 MED ORDER — DOXYCYCLINE HYCLATE 100 MG PO TABS
100.0000 mg | ORAL_TABLET | Freq: Once | ORAL | Status: AC
  Administered 2023-01-13: 100 mg via ORAL
  Filled 2023-01-13: qty 1

## 2023-01-13 MED ORDER — HYDRALAZINE HCL 20 MG/ML IJ SOLN: 10.0000 mg | Freq: Once | INTRAMUSCULAR | Status: DC

## 2023-01-13 MED ORDER — ALBUTEROL SULFATE HFA 108 (90 BASE) MCG/ACT IN AERS: 2.0000 | INHALATION_SPRAY | RESPIRATORY_TRACT | 1 refills | Status: DC | PRN

## 2023-01-13 MED ORDER — PREDNISONE 20 MG PO TABS
60.0000 mg | ORAL_TABLET | Freq: Once | ORAL | Status: AC
  Administered 2023-01-13: 60 mg via ORAL
  Filled 2023-01-13: qty 3

## 2023-01-13 MED ORDER — CARVEDILOL 12.5 MG PO TABS
12.5000 mg | ORAL_TABLET | Freq: Once | ORAL | Status: AC
  Administered 2023-01-13: 12.5 mg via ORAL
  Filled 2023-01-13: qty 1

## 2023-01-13 MED ORDER — OXYCODONE-ACETAMINOPHEN 5-325 MG PO TABS
1.0000 | ORAL_TABLET | Freq: Once | ORAL | Status: AC
  Administered 2023-01-13: 1 via ORAL
  Filled 2023-01-13: qty 1

## 2023-01-13 NOTE — ED Triage Notes (Addendum)
PT arrives via EMS from home. Pt reports dizziness, she fell and injured her right shoulder, pt did hit her head. No loc after the fall. She states she is on xarelto.  PT also endorses productive cough, sob, and chest pain for over a week. Pt reports she has coughed up blood. Hx of PE.

## 2023-01-13 NOTE — Discharge Instructions (Addendum)
It was our pleasure to provide your ER care today - we hope that you feel better.  Drink plenty of fluids/stay well hydrated. Avoid any smoking/vaping. Take doxycycline as prescribed. Take prednisone as prescribed. Use albuterol treatment as need.   Your xray was read as showing 'tiny left effusion'.  Follow up with your doctor for repeat cxr in one month.   Follow up with primary care doctor in one week if symptoms fail to improve/resolve. Also follow up closely with your doctor in the next 1-2 weeks regarding your blood pressure that is high today.  From your labs, your platelet count is low - follow up with your doctor.   Return to ER right away if worse, recurrent/persistent chest pain, increased trouble breathing, high fevers, or other concern.   You were given pain medication in the ER - no driving for the next 6 hours.

## 2023-01-13 NOTE — ED Provider Notes (Signed)
[place] [facility]GENC[facility]MENT AT [facility] Provider Note   CSN: [id number] Arrival date & time: 01/13/23  1652     History  Chief Complaint  Patient presents with   Fall   Chest Pain   Shortness of Breath   Dizziness    [name] is a 67 y.o. female.  Pt c/o productive cough with greenish phlegm, and sob/wheezing. No known fevers. No specific known ill contacts. Indicated to triage RN chest discomfort, currently denies. No exertional chest pain. No constant and/or pleuritic chest pain. Also indicates recent fall, hit head, no loc, c/o right shoulder pain. Is on xarelto. No neck or back pain. No abd pain or nv. No focal extremity pain or swelling. Former smoker, now vapes. Uses inhaler prn.   The history is provided by the patient, medical records and the EMS personnel.       Home Medications Prior to Admission medications   Medication Sig Start Date End Date Taking? Authorizing Provider  albuterol (PROAIR HFA) 108 (90 Base) MCG/ACT inhaler INHALE 2 PUFFS INTO THE LUNGS EVERY 4 HOURS AS NEEDED FOR WHEEZING Patient taking differently: Inhale 2 puffs into the lungs every 4 (four) hours as needed for shortness of breath or wheezing. 12/04/18   Oretha Milch, MD  albuterol (PROVENTIL) (2.5 MG/3ML) 0.083% nebulizer solution Take 3 mLs (2.5 mg total) by nebulization every 4 (four) hours as needed for wheezing or shortness of breath. Patient taking differently: Take 2.5 mg by nebulization as needed for wheezing or shortness of breath. 12/04/18   Oretha Milch, MD  ALPRAZolam Prudy Feeler) 1 MG tablet Take 1 tablet (1 mg total) by mouth 3 (three) times daily. Patient taking differently: Take 1 mg by mouth 3 (three) times daily as needed for anxiety. 06/27/22   Shalhoub, Deno Lunger, MD  budesonide-formoterol (SYMBICORT) 160-4.5 MCG/ACT inhaler Inhale 2 puffs into the lungs 2 (two) times daily. Patient taking differently: Inhale 2 puffs into the lungs in the morning and at  bedtime. 12/04/18 08/24/22  Oretha Milch, MD  buPROPion (WELLBUTRIN XL) 150 MG 24 hr tablet Take 150 mg by mouth every morning.    [provider]  carvedilol (COREG) 12.5 MG tablet Take 1 tablet (12.5 mg total) by mouth 2 (two) times daily with a meal. 06/27/22   Shalhoub, Deno Lunger, MD  Cholecalciferol (VITAMIN D3) 50 MCG (2000 UT) TABS Take 2,000 Units by mouth daily.    [provider]  clotrimazole-betamethasone (LOTRISONE) cream Apply to affected area 2 times daily prn Patient taking differently: Apply 1 application  topically as needed (rash). 04/29/20   Cathren Laine, MD  docusate sodium (COLACE) 100 MG capsule Take 100 mg by mouth 2 (two) times daily.    [provider]  doxepin (SINEQUAN) 100 MG capsule Take 100 mg by mouth at bedtime. 06/01/22   [provider]  entecavir (BARACLUDE) 0.5 MG tablet Take 0.5 mg by mouth every other day. 11/25/19   [provider]  famotidine (PEPCID) 20 MG tablet Take 20 mg by mouth 2 (two) times daily. 10/28/19   [provider]  ferrous sulfate (FEROSUL) 325 (65 FE) MG tablet Take 325 mg by mouth daily.    [provider]  furosemide (LASIX) 20 MG tablet Take 20 mg by mouth daily. 07/06/22   [provider]  gabapentin (NEURONTIN) 300 MG capsule Take 300 mg by mouth 3 (three) times daily. 07/13/20   [provider]  levocetirizine (XYZAL) 5 MG tablet  Take 5 mg by mouth every evening. 12/02/19   [provider]  montelukast (SINGULAIR) 10 MG tablet Take 10 mg by mouth daily.    [provider]  naloxone [facility]) nasal spray 4 mg/0.1 mL Place into the nose. 04/14/21   [provider]  NYSTATIN powder Apply 1 Application topically as needed (yeast). 07/05/21   [provider]  ondansetron (ZOFRAN-ODT) 8 MG disintegrating tablet Take 8 mg by mouth every 8 (eight) hours as needed for nausea or vomiting.    [provider]  pantoprazole (PROTONIX) 40  MG tablet Take 40 mg by mouth 2 (two) times daily before a meal. 12/23/13   [provider]  RIVAROXABAN Carlena Hurl) VTE STARTER PACK (15 & 20 MG) Follow package directions: Take one 15mg  tablet by mouth twice a day. On day 22, switch to one 20mg  tablet once a day. Take with food. 08/09/22   Tyrone Nine, MD  spironolactone (ALDACTONE) 25 MG tablet Take 1 tablet (25 mg total) by mouth daily. 06/27/22   Shalhoub, Deno Lunger, MD  sucralfate (CARAFATE) 1 g tablet Take 1 g by mouth 4 (four) times daily. 07/22/19   [provider]  valACYclovir (VALTREX) 500 MG tablet Take 500 mg by mouth daily as needed (breakout). 08/21/17   [provider]  enoxaparin (LOVENOX) 120 MG/0.8ML SOLN Inject 0.8 mLs (120 mg total) into the skin every 12 (twelve) hours. 11/30/10 04/16/11  Daryel Gerald, MD  loratadine (CLARITIN) 10 MG tablet Take 10 mg by mouth daily.    04/16/11  [provider]      Allergies    Heparin, Amitriptyline hcl, Azithromycin, Fentanyl, Cymbalta [duloxetine hcl], and Neomycin-bacitracin zn-polymyx    Review of Systems   Review of Systems  Constitutional:  Negative for chills and fever.  HENT:  Positive for rhinorrhea. Negative for sore throat.   Eyes:  Negative for redness and visual disturbance.  Respiratory:  Positive for cough, shortness of breath and wheezing.   Cardiovascular:  Negative for leg swelling.  Gastrointestinal:  Negative for abdominal pain, diarrhea and vomiting.  Genitourinary:  Negative for flank pain.  Musculoskeletal:  Negative for back pain and neck pain.  Skin:  Negative for rash.  Neurological:  Negative for speech difficulty, numbness and headaches.  Psychiatric/Behavioral:  Negative for confusion.     Physical Exam Updated Vital Signs BP (!) 143/92 (BP Location: Left Arm)   Pulse 79   Temp 97.6 F (36.4 C) (Oral)   Resp 18   Ht 1.626 m (5\' 4" )   Wt 104.3 kg   SpO2 98%   BMI 39.48 kg/m  Physical Exam Vitals and  nursing note reviewed.  Constitutional:      Appearance: Normal appearance. She is well-developed.  HENT:     Head: Atraumatic.     Nose: Nose normal.     Mouth/Throat:     Mouth: Mucous membranes are moist.  Eyes:     General: No scleral icterus.    Conjunctiva/sclera: Conjunctivae normal.     Pupils: Pupils are equal, round, and reactive to light.  Neck:     Trachea: No tracheal deviation.  Cardiovascular:     Rate and Rhythm: Normal rate and regular rhythm.     Pulses: Normal pulses.     Heart sounds: Normal heart sounds. No murmur heard.    No friction rub. No gallop.  Pulmonary:     Effort: Pulmonary effort is normal. No respiratory distress.  Breath sounds: Wheezing present.  Chest:     Chest wall: No tenderness.  Abdominal:     General: Bowel sounds are normal. There is no distension.     Palpations: Abdomen is soft.     Tenderness: There is no abdominal tenderness. There is no guarding.  Genitourinary:    Comments: No cva tenderness.  Musculoskeletal:        General: No swelling.     Cervical back: Normal range of motion and neck supple. No rigidity. No muscular tenderness.     Comments: Mild tenderness right shoulder. No deformity or significant sts noted. No other focal pain or bony tenderness on bilateral extremity exam. CTLS spine, non tender, aligned, no step off.   Skin:    General: Skin is warm and dry.     Findings: No rash.  Neurological:     Mental Status: She is alert.     Comments: Alert, speech normal. Motor/sens grossly intact bil.   Psychiatric:        Mood and Affect: Mood normal.     ED Results / Procedures / Treatments   Labs (all labs ordered are listed, but only abnormal results are displayed) Results for orders placed or performed during the hospital encounter of 01/13/23  Basic metabolic panel   Collection Time: 01/13/23  6:20 PM  Result Value Ref Range   Sodium 139 135 - 145 mmol/L   Potassium 4.4 3.5 - 5.1 mmol/L   Chloride 102  98 - 111 mmol/L   CO2 28 22 - 32 mmol/L   Glucose, Bld 94 70 - 99 mg/dL   BUN 20 8 - 23 mg/dL   Creatinine, Ser 1.47 (H) 0.44 - 1.00 mg/dL   Calcium 9.8 8.9 - 82.9 mg/dL   GFR, Estimated 51 (L) >60 mL/min   Anion gap 9 5 - 15  CBC   Collection Time: 01/13/23  6:20 PM  Result Value Ref Range   WBC 6.1 4.0 - 10.5 K/uL   RBC 4.40 3.87 - 5.11 MIL/uL   Hemoglobin 12.5 12.0 - 15.0 g/dL   HCT 56.2 13.0 - 86.5 %   MCV 90.9 80.0 - 100.0 fL   MCH 28.4 26.0 - 34.0 pg   MCHC 31.3 30.0 - 36.0 g/dL   RDW 78.4 69.6 - 29.5 %   Platelets 113 (L) 150 - 400 K/uL   nRBC 0.0 0.0 - 0.2 %  Brain natriuretic peptide   Collection Time: 01/13/23  6:20 PM  Result Value Ref Range   B Natriuretic Peptide 56.5 0.0 - 100.0 pg/mL  Troponin I (High Sensitivity)   Collection Time: 01/13/23  6:20 PM  Result Value Ref Range   Troponin I (High Sensitivity) 7 <18 ng/L  Resp panel by RT-PCR (RSV, Flu A&B, Covid) Anterior Nasal Swab   Collection Time: 01/13/23  6:39 PM   Specimen: Anterior Nasal Swab  Result Value Ref Range   SARS Coronavirus 2 by RT PCR NEGATIVE NEGATIVE   Influenza A by PCR NEGATIVE NEGATIVE   Influenza B by PCR NEGATIVE NEGATIVE   Resp Syncytial Virus by PCR NEGATIVE NEGATIVE      EKG EKG Interpretation Date/Time:  Saturday January 13 2023 17:31:33 EST Ventricular Rate:  93 PR Interval:  178 QRS Duration:  94 QT Interval:  333 QTC Calculation: 415 R Axis:   -39  Text Interpretation: Sinus rhythm Probable LVH with secondary repol abnrm Confirmed by Cathren Laine ([id number]) on 01/13/2023 5:47:37 PM  Radiology DG Shoulder Right Result  Date: 01/13/2023 CLINICAL DATA:  Patient fell and injured right shoulder. EXAM: RIGHT SHOULDER - 2+ VIEW COMPARISON:  None Available. FINDINGS: Bones are diffusely demineralized. No evidence for an acute fracture. No shoulder separation or dislocation. Degenerative changes are noted in the glenohumeral joint with undersurface spurring noted along the  acromion. IMPRESSION: Degenerative changes in the right shoulder without acute bony findings. Electronically Signed   By: Kennith Center M.D.   On: 01/13/2023 18:14   DG Chest 2 View Result Date: 01/13/2023 CLINICAL DATA:  Shortness of breath. EXAM: CHEST - 2 VIEW COMPARISON:  08/24/2022 FINDINGS: Right lung clear. Blunting of the left costophrenic angle suggests tiny left pleural effusion. There may be some minimal atelectasis or infiltrate at the left base. Cardiopericardial silhouette is at upper limits of normal for size. No acute bony abnormality. Telemetry leads overlie the chest. IMPRESSION: Tiny left pleural effusion with minimal atelectasis or infiltrate at the left base . Electronically Signed   By: Kennith Center M.D.   On: 01/13/2023 18:12   CT Head Wo Contrast Result Date: 01/13/2023 CLINICAL DATA:  Minor head trauma. Dizziness after a fall, striking the head. No loss of consciousness. Xarelto. Cough, shortness of breath, and chest pain. EXAM: CT HEAD WITHOUT CONTRAST TECHNIQUE: Contiguous axial images were obtained from the base of the skull through the vertex without intravenous contrast. RADIATION DOSE REDUCTION: This exam was performed according to the departmental dose-optimization program which includes automated exposure control, adjustment of the mA and/or kV according to patient size and/or use of iterative reconstruction technique. COMPARISON:  MRI brain 08/24/2022.  CT head 08/24/2022. FINDINGS: Brain: No evidence of acute infarction, hemorrhage, hydrocephalus, extra-axial collection or mass lesion/mass effect. Vascular: No hyperdense vessel or unexpected calcification. Skull: Normal. Negative for fracture or focal lesion. Sinuses/Orbits: No acute finding. Other: None. IMPRESSION: No acute intracranial abnormalities. Electronically Signed   By: Burman Nieves M.D.   On: 01/13/2023 17:58    Procedures Procedures    Medications Ordered in ED Medications  hydrALAZINE  (APRESOLINE) injection 10 mg (has no administration in time range)  albuterol (PROVENTIL) (2.5 MG/3ML) 0.083% nebulizer solution 5 mg (5 mg Nebulization Given 01/13/23 1822)  oxyCODONE-acetaminophen (PERCOCET/ROXICET) 5-325 MG per tablet 1 tablet (1 tablet Oral Given 01/13/23 1822)  albuterol (PROVENTIL) (2.5 MG/3ML) 0.083% nebulizer solution 5 mg (5 mg Nebulization Given 01/13/23 2040)  ipratropium (ATROVENT) nebulizer solution 0.5 mg (0.5 mg Nebulization Given 01/13/23 2039)  predniSONE (DELTASONE) tablet 60 mg (60 mg Oral Given 01/13/23 2038)  oxyCODONE (Oxy IR/ROXICODONE) immediate release tablet 5 mg (5 mg Oral Given 01/13/23 2038)  doxycycline (VIBRA-TABS) tablet 100 mg (100 mg Oral Given 01/13/23 2143)  amLODipine (NORVASC) tablet 5 mg (5 mg Oral Given 01/13/23 2143)  carvedilol (COREG) tablet 12.5 mg (12.5 mg Oral Given 01/13/23 2143)    ED Course/ Medical Decision Making/ A&P                                 Medical Decision Making Problems Addressed: Bronchitis: acute illness or injury with systemic symptoms Elevated blood pressure reading: acute illness or injury Essential hypertension: chronic illness or injury with exacerbation, progression, or side effects of treatment that poses a threat to life or bodily functions Productive cough: acute illness or injury with systemic symptoms Thrombocytopenia (HCC): acute illness or injury Wheezing: acute illness or injury with systemic symptoms that poses a threat to life or bodily functions  Amount and/or  Complexity of Data Reviewed Independent Historian: EMS    Details: hx External Data Reviewed: notes. Labs: ordered. Decision-making details documented in ED Course. Radiology: ordered and independent interpretation performed. Decision-making details documented in ED Course. ECG/medicine tests: ordered and independent interpretation performed. Decision-making details documented in ED Course.  Risk Prescription drug  management. Decision regarding hospitalization.   Iv ns. Continuous pulse ox and cardiac monitoring. Labs ordered/sent. Imaging ordered.   Differential diagnosis includes pna, covid, flu, etc. Dispo decision including potential need for admission considered - will get labs and imaging and reassess.   Reviewed nursing notes and prior charts for additional history. External reports reviewed. Additional history from: EMS.   Cardiac monitor: sinus rhythm, rate 90.  Albuterol tx.   Labs reviewed/interpreted by me - chem normal. Plt low. Wbc normal. Trop normal. Bnp normal.   Xrays reviewed/interpreted by me - ?tiny effusion. No pna.   CT reviewed/interpreted by me - no hem.   Recheck, no wheezing or increased wob. Bp is high, has not had meds today - will give dose of meds.  No current headache. No chest pain or sob. Pt requests pain medication 'without acetaminophen'. Oxycodone po.  Wheezing persists. Albuterol and atrovent. Prednisone po.   Pt is breathing comfortably, no acute distress. No wheezing or increased wob. No cp.   Given productive cough, congestion, will give rx doxycycline.   Bp improved, 143/92. Breathing comfortably.   Pt currently appears stable for discharge.   Rec close pcp f/u.  Return precautions provided.          Final Clinical Impression(s) / ED Diagnoses Final diagnoses:  Productive cough  Wheezing  Bronchitis  Elevated blood pressure reading  Essential hypertension  Thrombocytopenia (HCC)    Rx / DC Orders ED Discharge Orders     None         Cathren Laine, MD 01/13/23 2156

## 2023-01-14 NOTE — ED Notes (Signed)
Pt called her grandson to come get her. Pt is alert and oriented x 4 .

## 2023-03-12 ENCOUNTER — Ambulatory Visit: Payer: 59 | Admitting: Neurology

## 2023-04-26 ENCOUNTER — Other Ambulatory Visit (INDEPENDENT_AMBULATORY_CARE_PROVIDER_SITE_OTHER): Payer: Self-pay

## 2023-04-26 ENCOUNTER — Ambulatory Visit: Admitting: Surgical

## 2023-04-26 ENCOUNTER — Other Ambulatory Visit: Payer: Self-pay | Admitting: Surgical

## 2023-04-26 ENCOUNTER — Telehealth: Payer: Self-pay | Admitting: Radiology

## 2023-04-26 DIAGNOSIS — R2243 Localized swelling, mass and lump, lower limb, bilateral: Secondary | ICD-10-CM

## 2023-04-26 DIAGNOSIS — M25511 Pain in right shoulder: Secondary | ICD-10-CM | POA: Diagnosis not present

## 2023-04-26 DIAGNOSIS — Z86718 Personal history of other venous thrombosis and embolism: Secondary | ICD-10-CM | POA: Diagnosis not present

## 2023-04-26 NOTE — Progress Notes (Signed)
 Office Visit Note   Patient: [name]           Date of Birth: [birth year]           MRN: [id number] Visit Date: 04/26/2023 Requested by: Tracey Harries, MD [address] [place],  [place] [address] PCP: Tracey Harries, MD  Subjective: Chief Complaint  Patient presents with  . Right Shoulder - Pain  . Right Knee - Pain  . Left Shoulder - Pain    HPI: [name] is a 68 y.o. female who presents to the office reporting bilateral knee pain, right shoulder pain, left shoulder pain.  She states that she fell on Tuesday.  She went to put her hand on her rollator walker and missed the handle causing her to fall and impact her right shoulder into a door frame.  She states that is very painful.  She also complains of left shoulder pain.  She has history of rotator cuff arthropathy in both shoulders.  Both shoulders are causing pain at night in particular and make it difficult to sleep.  Recently has been treated for cellulitis and states that this has resolved after several weeks of antibiotics.  She is intending to eventually do surgery for her left shoulder.  She has history of knee arthritis in her right knee and she would like to pursue gel injection for this..                ROS: All systems reviewed are negative as they relate to the chief complaint within the history of present illness.  Patient denies fevers or chills.  Assessment & Plan: Visit Diagnoses:  1. Acute pain of right shoulder     Plan: ***  Follow-Up Instructions: No follow-ups on file.   Orders:  Orders Placed This Encounter  Procedures  . XR Shoulder Right   No orders of the defined types were placed in this encounter.     Procedures: No procedures performed   Clinical Data: No additional findings.  Objective: Vital Signs: There were no vitals taken for this visit.  Physical Exam:  Constitutional: Patient appears well-developed HEENT:  Head: Normocephalic Eyes:EOM are  normal Neck: Normal range of motion Cardiovascular: Normal rate Pulmonary/chest: Effort normal Neurologic: Patient is alert Skin: Skin is warm Psychiatric: Patient has normal mood and affect  Ortho Exam: ***  Specialty Comments:  No specialty comments available.  Imaging: No results found.   PMFS History: Patient Active Problem List   Diagnosis Date Noted  . Acute metabolic encephalopathy 08/24/2022  . Bipolar disorder (HCC) 08/24/2022  . Hyponatremia 08/24/2022  . Hypokalemia 08/24/2022  . Reactive airway disease 08/24/2022  . Acute pulmonary embolism (HCC) 08/09/2022  . Pain and swelling of right lower leg 08/09/2022  . Chronic diastolic CHF (congestive heart failure) (HCC) 06/25/2022  . ABLA (acute blood loss anemia) 06/24/2022  . Severe sepsis with acute organ dysfunction (HCC) 01/04/2020  . Primary osteoarthritis of right knee 12/04/2019  . Primary osteoarthritis of left knee 12/04/2019  . Vitamin D deficiency 09/20/2019  . Cellulitis of foot 09/18/2019  . Aspiration pneumonia of both lower lobes due to gastric secretions (HCC) 06/02/2019  . Lumbar disc herniation 05/04/2019  . Encounter for colorectal cancer screening 04/19/2019  . Acute respiratory distress 04/06/2019  . Hyperglycemia 04/06/2019  . Pleural effusion on right 04/06/2019  . Benzodiazepine dependence (HCC) 02/16/2019  . Cervical stenosis of spinal canal 02/15/2019  . Streptococcal sepsis (HCC) 01/22/2019  . History  of sepsis 01/22/2019  . Acute bronchitis due to Rhinovirus 11/06/2018  . Lung collapse 11/01/2018  . 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 Compression fracture of body of thoracic vertebra (HCC) 08/01/2018  . Closed nondisplaced fracture of greater tuberosity of right humerus 07/08/2018  . On rivaroxaban therapy 07/08/2018  . Chronic anticoagulation 05/27/2018  . Falls frequently 05/27/2018  . History of DVT (deep vein thrombosis)  03/16/2018  . History of pulmonary embolism 03/10/2018  . Left lower quadrant abdominal pain 03/10/2018  . Chronic pain syndrome 03/05/2018  . COPD exacerbation (HCC) 06/09/2016  . Abnormal urine odor 09/26/2015  . Recurrent pulmonary embolism (HCC) 06/26/2015  . Lupus anticoagulant positive 06/26/2015  . Thrombocytopenia (HCC) 05/31/2015  . Lower urinary tract infectious disease 05/26/2015  . Right flank pain   . Patchy loss of hair 03/08/2015  . Primary osteoarthritis of both knees 02/25/2015  . Incarcerated incisional hernia s/p lap repair w mesh 12/10/2014 12/10/2014  . DVT (deep venous thrombosis) on Xarelto (HCC) 10/31/2013  . Pulmonary embolism (HCC) 10/26/2013  . Kidney stone 10/04/2013  . Tobacco abuse 10/04/2013  . COPD (chronic obstructive pulmonary disease) (HCC)   . Constipation 12/23/2012  . Chronic kidney disease (CKD), stage III (moderate) (HCC) 12/21/2012  . Allergic rhinitis 06/29/2012  . Chronic low back pain 06/29/2012  . Dysuria 06/29/2012  . Edema 06/29/2012  . Gastro-esophageal reflux disease without esophagitis 06/29/2012  . Hemorrhoids 06/29/2012  . Hirsutism 06/29/2012  . Insomnia 06/29/2012  . Localized superficial swelling, mass, or lump 06/29/2012  . Urinary incontinence 06/29/2012  . Skin sensation disturbance 06/29/2012  . Shortness of breath 06/29/2012  . Pulmonary embolism and infarction (HCC) 06/29/2012  . Proteinuria 06/29/2012  . Nondependent cannabis abuse 06/29/2012  . Acquired cyst of kidney 10/24/2011  . 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 Cocaine abuse (HCC) 11/27/2010  . Chronic depression 09/23/2009  . Ascites 09/23/2009  . Obesity 09/01/2008  . Chronic hepatitis B with cirrhosis (HCC) 08/31/2008  . Acute hepatitis C virus infection 08/31/2008  . Iron deficiency anemia due to chronic blood loss 08/31/2008  . Generalized anxiety disorder 08/31/2008  . Essential hypertension 08/31/2008  . Coronary atherosclerosis  08/31/2008  . Hepatitis B related cirrhosis (HCC) 08/31/2008  . History of hepatitis C 08/31/2008   Past Medical History:  Diagnosis Date  . Agoraphobia with panic attacks   . Altered mental state 10/26/2013  . Ankle fracture, left 11/27/2010   S/p ORIF 11/2   . Anxiety   . Arthritis    "knees; lower back" (10/30/2013)  . Bipolar disorder (HCC)   . Cavitary pneumonia   . Cirrhosis (HCC)   . CKD (chronic kidney disease), stage III (HCC)   . COPD (chronic obstructive pulmonary disease) (HCC)   . Depression   . DVT (deep venous thrombosis) (HCC) 09/2013   RLE  . GERD (gastroesophageal reflux disease)   . Heart murmur   . Hepatitis B   . History of blood transfusion    "due to excessive blood loss before hysterectomy"  . History of urinary tract infection   . Hypertension    currently on no medication   . Kidney stones   . Pneumonia    "4 times in the past year" (10/30/2013)  . Pulmonary emboli (HCC) 09/2013  . Reactive airway disease 08/24/2022  . Shortness of breath dyspnea    increased exertion;exercise  . Urinary frequency   . Urinary incontinence     Family  History  Problem Relation Age of Onset  . Stroke Mother   . Lung cancer Father     Past Surgical History:  Procedure Laterality Date  . ABDOMINAL HYSTERECTOMY    . ANKLE FRACTURE SURGERY Left   . BIOPSY  06/26/2022   Procedure: BIOPSY;  Surgeon: Vida Rigger, MD;  Location: [facility];  Service: Gastroenterology;;  . CARPAL TUNNEL RELEASE Left   . DILATION AND CURETTAGE OF UTERUS  X 2  . ESOPHAGOGASTRODUODENOSCOPY (EGD) WITH PROPOFOL N/A 06/26/2022   Procedure: ESOPHAGOGASTRODUODENOSCOPY (EGD) WITH PROPOFOL;  Surgeon: Vida Rigger, MD;  Location: [facility];  Service: Gastroenterology;  Laterality: N/A;  . FRACTURE SURGERY    . HAND SURGERY     1980's/left hand   . INSERTION OF MESH N/A 12/10/2014   Procedure: INSERTION OF MESH;  Surgeon: Karie Soda, MD;  Location: [facility];  Service: General;  Laterality: N/A;  .  LAPAROSCOPIC ASSISTED VENTRAL HERNIA REPAIR N/A 12/10/2014   Procedure: LAPAROSCOPIC ASSISTED REPAIR OF INCARCERATED UMBILICAL HERNIA ;  Surgeon: Karie Soda, MD;  Location: [facility];  Service: General;  Laterality: N/A;  . TUBAL LIGATION     Social History   Occupational History  . Not on file  Tobacco Use  . Smoking status: Former    Current packs/day: 0.00    Average packs/day: 0.5 packs/day for 35.0 years (17.5 ttl pk-yrs)    Types: Cigarettes    Start date: 10/27/1978    Quit date: 10/26/2013    Years since quitting: 9.5  . Smokeless tobacco: Never  Vaping Use  . Vaping status: Some Days  . Substances: Nicotine, Flavoring  Substance and Sexual Activity  . Alcohol use: No    Alcohol/week: 0.0 standard drinks of alcohol    Comment: "stopped drinking  in 2004"  . Drug use: No    Types: "Crack" cocaine, Marijuana    Comment: 10/30/2013 "last crack in 2014; last marijuana in ~ 1990" drug free for last 3 years   . Sexual activity: Never

## 2023-04-26 NOTE — Telephone Encounter (Signed)
 Please obtain authorization for right knee gel injection -[name]

## 2023-04-27 ENCOUNTER — Telehealth: Payer: Self-pay | Admitting: Radiology

## 2023-04-27 ENCOUNTER — Ambulatory Visit (HOSPITAL_COMMUNITY)
Admission: RE | Admit: 2023-04-27 | Discharge: 2023-04-27 | Disposition: A | Discharge status: Home / Self Care | Source: Ambulatory Visit | Attending: Surgical | Admitting: Surgical

## 2023-04-27 ENCOUNTER — Telehealth (HOSPITAL_COMMUNITY): Payer: Self-pay

## 2023-04-27 ENCOUNTER — Ambulatory Visit (HOSPITAL_COMMUNITY)

## 2023-04-27 ENCOUNTER — Encounter: Payer: Self-pay | Admitting: Surgical

## 2023-04-27 DIAGNOSIS — R2243 Localized swelling, mass and lump, lower limb, bilateral: Secondary | ICD-10-CM | POA: Insufficient documentation

## 2023-04-27 DIAGNOSIS — Z86718 Personal history of other venous thrombosis and embolism: Secondary | ICD-10-CM | POA: Diagnosis present

## 2023-04-27 NOTE — Telephone Encounter (Signed)
 FYI  Patient is scheduled for Bilateral Lower Extremity Dopplers this afternoon at 3:30pm-[address] location.

## 2023-04-27 NOTE — Telephone Encounter (Signed)
 Received message after hours regarding scheduling a [place]AS [place].  Attempted to contact the patient to schedule VAS [place].  No answer.  Unable to leave message.  First Attempt. Unable to provide direct contact number for scheduling: [phone].   Sent response to original message requesting scheduling assistance, with details on the approved add-on appointment.

## 2023-05-03 ENCOUNTER — Telehealth: Payer: Self-pay | Admitting: Surgical

## 2023-05-03 NOTE — Telephone Encounter (Signed)
 Patient called and said she is having trouble with transportation so is it possible to get an knee brace sent to her. She needs one very bad. [phone]

## 2023-05-04 NOTE — Telephone Encounter (Signed)
 Lvm advising pt.

## 2023-05-05 ENCOUNTER — Emergency Department (HOSPITAL_COMMUNITY)

## 2023-05-05 ENCOUNTER — Other Ambulatory Visit: Payer: Self-pay

## 2023-05-05 ENCOUNTER — Emergency Department (HOSPITAL_COMMUNITY)
Admission: EM | Admit: 2023-05-05 | Discharge: 2023-05-05 | Disposition: A | Discharge status: Home / Self Care | Attending: Emergency Medicine | Admitting: Emergency Medicine

## 2023-05-05 DIAGNOSIS — I129 Hypertensive chronic kidney disease with stage 1 through stage 4 chronic kidney disease, or unspecified chronic kidney disease: Secondary | ICD-10-CM | POA: Insufficient documentation

## 2023-05-05 DIAGNOSIS — Z7901 Long term (current) use of anticoagulants: Secondary | ICD-10-CM | POA: Insufficient documentation

## 2023-05-05 DIAGNOSIS — Z87891 Personal history of nicotine dependence: Secondary | ICD-10-CM | POA: Insufficient documentation

## 2023-05-05 DIAGNOSIS — J441 Chronic obstructive pulmonary disease with (acute) exacerbation: Secondary | ICD-10-CM | POA: Diagnosis not present

## 2023-05-05 DIAGNOSIS — M79661 Pain in right lower leg: Secondary | ICD-10-CM | POA: Diagnosis not present

## 2023-05-05 DIAGNOSIS — M7989 Other specified soft tissue disorders: Secondary | ICD-10-CM | POA: Diagnosis not present

## 2023-05-05 DIAGNOSIS — Z7951 Long term (current) use of inhaled steroids: Secondary | ICD-10-CM | POA: Insufficient documentation

## 2023-05-05 DIAGNOSIS — N183 Chronic kidney disease, stage 3 unspecified: Secondary | ICD-10-CM | POA: Insufficient documentation

## 2023-05-05 DIAGNOSIS — R0602 Shortness of breath: Secondary | ICD-10-CM | POA: Diagnosis present

## 2023-05-05 LAB — CBC WITH DIFFERENTIAL/PLATELET
Abs Immature Granulocytes: 0.02 10*3/uL (ref 0.00–0.07)
Basophils Absolute: 0 10*3/uL (ref 0.0–0.1)
Basophils Relative: 1 %
Eosinophils Absolute: 0.1 10*3/uL (ref 0.0–0.5)
Eosinophils Relative: 1 %
HCT: 33.7 % — ABNORMAL LOW (ref 36.0–46.0)
Hemoglobin: 10.3 g/dL — ABNORMAL LOW (ref 12.0–15.0)
Immature Granulocytes: 1 %
Lymphocytes Relative: 38 %
Lymphs Abs: 1.6 10*3/uL (ref 0.7–4.0)
MCH: 26.8 pg (ref 26.0–34.0)
MCHC: 30.6 g/dL (ref 30.0–36.0)
MCV: 87.8 fL (ref 80.0–100.0)
Monocytes Absolute: 0.3 10*3/uL (ref 0.1–1.0)
Monocytes Relative: 8 %
Neutro Abs: 2.2 10*3/uL (ref 1.7–7.7)
Neutrophils Relative %: 51 %
Platelets: 136 10*3/uL — ABNORMAL LOW (ref 150–400)
RBC: 3.84 MIL/uL — ABNORMAL LOW (ref 3.87–5.11)
RDW: 14 % (ref 11.5–15.5)
WBC: 4.3 10*3/uL (ref 4.0–10.5)
nRBC: 0 % (ref 0.0–0.2)

## 2023-05-05 LAB — COMPREHENSIVE METABOLIC PANEL WITH GFR
ALT: 13 U/L (ref 0–44)
AST: 19 U/L (ref 15–41)
Albumin: 2.9 g/dL — ABNORMAL LOW (ref 3.5–5.0)
Alkaline Phosphatase: 100 U/L (ref 38–126)
Anion gap: 8 (ref 5–15)
BUN: 7 mg/dL — ABNORMAL LOW (ref 8–23)
CO2: 26 mmol/L (ref 22–32)
Calcium: 9.1 mg/dL (ref 8.9–10.3)
Chloride: 104 mmol/L (ref 98–111)
Creatinine, Ser: 1.13 mg/dL — ABNORMAL HIGH (ref 0.44–1.00)
GFR, Estimated: 54 mL/min — ABNORMAL LOW (ref >60)
Glucose, Bld: 120 mg/dL — ABNORMAL HIGH (ref 70–99)
Potassium: 3.8 mmol/L (ref 3.5–5.1)
Sodium: 138 mmol/L (ref 135–145)
Total Bilirubin: 0.3 mg/dL (ref 0.0–1.2)
Total Protein: 6.6 g/dL (ref 6.5–8.1)

## 2023-05-05 LAB — TROPONIN I (HIGH SENSITIVITY)
Troponin I (High Sensitivity): 8 ng/L (ref <18)
Troponin I (High Sensitivity): 8 ng/L (ref <18)

## 2023-05-05 LAB — RESP PANEL BY RT-PCR (RSV, FLU A&B, COVID)  RVPGX2
Influenza A by PCR: NEGATIVE
Influenza B by PCR: NEGATIVE
Resp Syncytial Virus by PCR: NEGATIVE
SARS Coronavirus 2 by RT PCR: NEGATIVE

## 2023-05-05 MED ORDER — OXYCODONE HCL 5 MG PO TABS
5.0000 mg | ORAL_TABLET | Freq: Once | ORAL | Status: AC
  Administered 2023-05-05: 5 mg via ORAL
  Filled 2023-05-05: qty 1

## 2023-05-05 MED ORDER — ONDANSETRON HCL 4 MG/2ML IJ SOLN
4.0000 mg | Freq: Once | INTRAMUSCULAR | Status: AC
  Administered 2023-05-05: 4 mg via INTRAVENOUS
  Filled 2023-05-05: qty 2

## 2023-05-05 MED ORDER — METHYLPREDNISOLONE 4 MG PO TBPK
ORAL_TABLET | ORAL | 0 refills | Status: AC
Start: 2023-05-05 — End: ?

## 2023-05-05 MED ORDER — BUDESONIDE-FORMOTEROL FUMARATE 80-4.5 MCG/ACT IN AERO: 2.0000 | INHALATION_SPRAY | Freq: Two times a day (BID) | RESPIRATORY_TRACT | 12 refills | Status: AC

## 2023-05-05 MED ORDER — HYDROCODONE-ACETAMINOPHEN 5-325 MG PO TABS
1.0000 | ORAL_TABLET | Freq: Once | ORAL | Status: DC
  Filled 2023-05-05: qty 1

## 2023-05-05 MED ORDER — DOXYCYCLINE HYCLATE 100 MG PO CAPS: 100.0000 mg | ORAL_CAPSULE | Freq: Two times a day (BID) | ORAL | 0 refills | Status: AC

## 2023-05-05 MED ORDER — SODIUM CHLORIDE 0.9 % IV BOLUS
500.0000 mL | Freq: Once | INTRAVENOUS | Status: AC
  Administered 2023-05-05: 500 mL via INTRAVENOUS

## 2023-05-05 MED ORDER — IOHEXOL 350 MG/ML SOLN
75.0000 mL | Freq: Once | INTRAVENOUS | Status: AC | PRN
  Administered 2023-05-05: 75 mL via INTRAVENOUS

## 2023-05-05 MED ORDER — OXYCODONE HCL 5 MG PO TABS: 5.0000 mg | ORAL_TABLET | Freq: Four times a day (QID) | ORAL | 0 refills | Status: DC | PRN

## 2023-05-05 MED ORDER — ALBUTEROL SULFATE HFA 108 (90 BASE) MCG/ACT IN AERS: 2.0000 | INHALATION_SPRAY | RESPIRATORY_TRACT | 0 refills | Status: DC | PRN

## 2023-05-05 MED ORDER — MORPHINE SULFATE (PF) 4 MG/ML IV SOLN
4.0000 mg | Freq: Once | INTRAVENOUS | Status: AC
  Administered 2023-05-05: 4 mg via INTRAVENOUS
  Filled 2023-05-05: qty 1

## 2023-05-05 MED ORDER — IPRATROPIUM-ALBUTEROL 0.5-2.5 (3) MG/3ML IN SOLN
3.0000 mL | Freq: Once | RESPIRATORY_TRACT | Status: AC
  Administered 2023-05-05: 3 mL via RESPIRATORY_TRACT
  Filled 2023-05-05: qty 3

## 2023-05-05 NOTE — ED Provider Notes (Addendum)
 [facility] Provider Note  CSN: [id number] Arrival date & time: 05/05/23 0131  Chief Complaint(s) Shortness of Breath and Leg Pain (Right calf pain)  HPI [name] is a 68 y.o. female with past medical history as below, significant for bipolar disorder, DVT on  xarelto, HTN, GERD, copd, ckd who presents to the ED with complaint of dib, leg pain  Patient reports right calf swelling over the past year, gradually worsening.  She had outpatient ultrasound performed about a week ago and was told there is no evidence of blood clot.  She was recent treated for cellulitis to her forearm after cat Wound, this is since improved  She reports worsening difficulty breathing and coughing over the past 3 days after another exposure to a cat.  Occasional productive cough with brown sputum.  Mild exertional dyspnea worsened from baseline.  No chest pain.  No fevers.  He is compliant with her DOAC  Past Medical History Past Medical History:  Diagnosis Date   Agoraphobia with panic attacks    Altered mental state 10/26/2013   Ankle fracture, left 11/27/2010   S/p ORIF 11/2    Anxiety    Arthritis    "knees; lower back" (10/30/2013)   Bipolar disorder (HCC)    Cavitary pneumonia    Cirrhosis (HCC)    CKD (chronic kidney disease), stage III (HCC)    COPD (chronic obstructive pulmonary disease) (HCC)    Depression    DVT (deep venous thrombosis) (HCC) 09/2013   RLE   GERD (gastroesophageal reflux disease)    Heart murmur    Hepatitis B    History of blood transfusion    "due to excessive blood loss before hysterectomy"   History of urinary tract infection    Hypertension    currently on no medication    Kidney stones    Pneumonia    "4 times in the past year" (10/30/2013)   Pulmonary emboli (HCC) 09/2013   Reactive airway disease 08/24/2022   Shortness of breath dyspnea    increased exertion;exercise   Urinary frequency    Urinary incontinence     Patient Active Problem List   Diagnosis Date Noted   Acute metabolic encephalopathy 08/24/2022   Bipolar disorder (HCC) 08/24/2022   Hyponatremia 08/24/2022   Hypokalemia 08/24/2022   Reactive airway disease 08/24/2022   Acute pulmonary embolism (HCC) 08/09/2022   Pain and swelling of right lower leg 08/09/2022   Chronic diastolic CHF (congestive heart failure) (HCC) 06/25/2022   ABLA (acute blood loss anemia) 06/24/2022   Severe sepsis with acute organ dysfunction (HCC) 01/04/2020   Primary osteoarthritis of right knee 12/04/2019   Primary osteoarthritis of left knee 12/04/2019   Vitamin D deficiency 09/20/2019   Cellulitis of foot 09/18/2019   Aspiration pneumonia of both lower lobes due to gastric secretions (HCC) 06/02/2019   Lumbar disc herniation 05/04/2019   Encounter for colorectal cancer screening 04/19/2019   Acute respiratory distress 04/06/2019   Hyperglycemia 04/06/2019   Pleural effusion on right 04/06/2019   Benzodiazepine dependence (HCC) 02/16/2019   Cervical stenosis of spinal canal 02/15/2019   Streptococcal sepsis (HCC) 01/22/2019   History of sepsis 01/22/2019   Acute bronchitis due to Rhinovirus 11/06/2018   Lung collapse 11/01/2018   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 08/13/2018   Compression fracture of body of thoracic vertebra (HCC) 08/01/2018   Closed nondisplaced fracture of greater tuberosity of right humerus 07/08/2018  On rivaroxaban therapy 07/08/2018   Chronic anticoagulation 05/27/2018   Falls frequently 05/27/2018   History of DVT (deep vein thrombosis) 03/16/2018   History of pulmonary embolism 03/10/2018   Left lower quadrant abdominal pain 03/10/2018   Chronic pain syndrome 03/05/2018   COPD exacerbation (HCC) 06/09/2016   Abnormal urine odor 09/26/2015   Recurrent pulmonary embolism (HCC) 06/26/2015   Lupus anticoagulant positive 06/26/2015   Thrombocytopenia (HCC) 05/31/2015   Lower urinary  tract infectious disease 05/26/2015   Right flank pain    Patchy loss of hair 03/08/2015   Primary osteoarthritis of both knees 02/25/2015   Incarcerated incisional hernia s/p lap repair w mesh 12/10/2014 12/10/2014   DVT (deep venous thrombosis) on Xarelto (HCC) 10/31/2013   Pulmonary embolism (HCC) 10/26/2013   Kidney stone 10/04/2013   Tobacco abuse 10/04/2013   COPD (chronic obstructive pulmonary disease) (HCC)    Constipation 12/23/2012   Chronic kidney disease (CKD), stage III (moderate) (HCC) 12/21/2012   Allergic rhinitis 06/29/2012   Chronic low back pain 06/29/2012   Dysuria 06/29/2012   Edema 06/29/2012   Gastro-esophageal reflux disease without esophagitis 06/29/2012   Hemorrhoids 06/29/2012   Hirsutism 06/29/2012   Insomnia 06/29/2012   Localized superficial swelling, mass, or lump 06/29/2012   Urinary incontinence 06/29/2012   Skin sensation disturbance 06/29/2012   Shortness of breath 06/29/2012   Pulmonary embolism and infarction (HCC) 06/29/2012   Proteinuria 06/29/2012   Nondependent cannabis abuse 06/29/2012   Acquired cyst of kidney 10/24/2011   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 Authorizing Provider  albuterol (VENTOLIN HFA) 108 (90 Base) MCG/ACT inhaler Inhale 2 puffs into the lungs every 4 (four) hours as needed for wheezing or shortness of breath. 05/05/23  Yes Russella Courts A, DO   budesonide-formoterol (SYMBICORT) 80-4.5 MCG/ACT inhaler Inhale 2 puffs into the lungs in the morning and at bedtime. 05/05/23  Yes Russella Courts A, DO  doxycycline (VIBRAMYCIN) 100 MG capsule Take 1 capsule (100 mg total) by mouth 2 (two) times daily for 7 days. 05/05/23 05/12/23 Yes Teddi Favors, DO  methylPREDNISolone (MEDROL DOSEPAK) 4 MG TBPK tablet Per manufacturers instructions 05/05/23  Yes Russella Courts A, DO  oxyCODONE (ROXICODONE) 5 MG immediate release tablet Take 1 tablet (5 mg total) by mouth every 6 (six) hours as needed for severe pain (pain score 7-10). 05/05/23  Yes Russella Courts A, DO  albuterol (PROAIR HFA) 108 (90 Base) MCG/ACT inhaler INHALE 2 PUFFS INTO THE LUNGS EVERY 4 HOURS AS NEEDED FOR WHEEZING Patient taking differently: Inhale 2 puffs into the lungs every 4 (four) hours as needed for shortness of breath or wheezing. 12/04/18   Lind Repine, MD  albuterol (PROVENTIL) (2.5 MG/3ML) 0.083% nebulizer solution Take 3 mLs (2.5 mg total) by nebulization every 4 (four) hours as needed for wheezing or shortness of breath. Patient taking differently: Take 2.5 mg by nebulization as needed for wheezing or shortness of breath. 12/04/18   Villa Greaser,  Comer Locket, MD  albuterol (VENTOLIN HFA) 108 (90 Base) MCG/ACT inhaler Inhale 2 puffs into the lungs every 4 (four) hours as needed for wheezing or shortness of breath. 01/13/23   Cathren Laine, MD  ALPRAZolam Prudy Feeler) 1 MG tablet Take 1 tablet (1 mg total) by mouth 3 (three) times daily. Patient taking differently: Take 1 mg by mouth 3 (three) times daily as needed for anxiety. 06/27/22   Shalhoub, Deno Lunger, MD  budesonide-formoterol (SYMBICORT) 160-4.5 MCG/ACT inhaler Inhale 2 puffs into the lungs 2 (two) times daily. Patient taking differently: Inhale 2 puffs into the lungs in the morning and at bedtime. 12/04/18 08/24/22  Oretha Milch, MD  buPROPion (WELLBUTRIN XL) 150 MG 24 hr tablet Take 150 mg by mouth every morning.    [provider]   carvedilol (COREG) 12.5 MG tablet Take 1 tablet (12.5 mg total) by mouth 2 (two) times daily with a meal. 06/27/22   Shalhoub, Deno Lunger, MD  Cholecalciferol (VITAMIN D3) 50 MCG (2000 UT) TABS Take 2,000 Units by mouth daily.    [provider]  clotrimazole-betamethasone (LOTRISONE) cream Apply to affected area 2 times daily prn Patient taking differently: Apply 1 application  topically as needed (rash). 04/29/20   Cathren Laine, MD  docusate sodium (COLACE) 100 MG capsule Take 100 mg by mouth 2 (two) times daily.    [provider]  doxepin (SINEQUAN) 100 MG capsule Take 100 mg by mouth at bedtime. 06/01/22   [provider]  entecavir (BARACLUDE) 0.5 MG tablet Take 0.5 mg by mouth every other day. 11/25/19   [provider]  famotidine (PEPCID) 20 MG tablet Take 20 mg by mouth 2 (two) times daily. 10/28/19   [provider]  ferrous sulfate (FEROSUL) 325 (65 FE) MG tablet Take 325 mg by mouth daily.    [provider]  furosemide (LASIX) 20 MG tablet Take 20 mg by mouth daily. 07/06/22   [provider]  gabapentin (NEURONTIN) 300 MG capsule Take 300 mg by mouth 3 (three) times daily. 07/13/20   [provider]  levocetirizine (XYZAL) 5 MG tablet Take 5 mg by mouth every evening. 12/02/19   [provider]  montelukast (SINGULAIR) 10 MG tablet Take 10 mg by mouth daily.    [provider]  naloxone [facility]) nasal spray 4 mg/0.1 mL Place into the nose. 04/14/21   [provider]  NYSTATIN powder Apply 1 Application topically as needed (yeast). 07/05/21   [provider]  ondansetron (ZOFRAN-ODT) 8 MG disintegrating tablet Take 8 mg by mouth every 8 (eight) hours as needed for nausea or vomiting.    [provider]  pantoprazole (PROTONIX) 40 MG tablet Take 40 mg by mouth 2 (two) times daily before a meal. 12/23/13   [provider]  RIVAROXABAN Carlena Hurl) VTE STARTER PACK (15 & 20 MG)  Follow package directions: Take one 15mg  tablet by mouth twice a day. On day 22, switch to one 20mg  tablet once a day. Take with food. 08/09/22   Tyrone Nine, MD  spironolactone (ALDACTONE) 25 MG tablet Take 1 tablet (25 mg total) by mouth daily. 06/27/22   Shalhoub, Deno Lunger, MD  sucralfate (CARAFATE) 1 g tablet Take 1 g by mouth 4 (four) times daily. 07/22/19   [provider]  valACYclovir (VALTREX) 500 MG tablet Take 500 mg by mouth daily as needed (breakout). 08/21/17   [provider]  enoxaparin (LOVENOX) 120 MG/0.8ML SOLN Inject 0.8 mLs (120 mg  total) into the skin every 12 (twelve) hours. 11/30/10 04/16/11  Antionette Bath, MD  loratadine (CLARITIN) 10 MG tablet Take 10 mg by mouth daily.    04/16/11  [provider]                                                                                                                                    Past Surgical History Past Surgical History:  Procedure Laterality Date   ABDOMINAL HYSTERECTOMY     ANKLE FRACTURE SURGERY Left    BIOPSY  06/26/2022   Procedure: BIOPSY;  Surgeon: Ozell Blunt, MD;  Location: [facility];  Service: Gastroenterology;;   CARPAL TUNNEL RELEASE Left    DILATION AND CURETTAGE OF UTERUS  X 2   ESOPHAGOGASTRODUODENOSCOPY (EGD) WITH PROPOFOL N/A 06/26/2022   Procedure: ESOPHAGOGASTRODUODENOSCOPY (EGD) WITH PROPOFOL;  Surgeon: Ozell Blunt, MD;  Location: [facility];  Service: Gastroenterology;  Laterality: N/A;   FRACTURE SURGERY     HAND SURGERY     1980's/left hand    INSERTION OF MESH N/A 12/10/2014   Procedure: INSERTION OF MESH;  Surgeon: Candyce Champagne, MD;  Location: [facility];  Service: General;  Laterality: N/A;   LAPAROSCOPIC ASSISTED VENTRAL HERNIA REPAIR N/A 12/10/2014   Procedure: LAPAROSCOPIC ASSISTED REPAIR OF INCARCERATED UMBILICAL HERNIA ;  Surgeon: Candyce Champagne, MD;  Location: [facility];  Service: General;  Laterality: N/A;   TUBAL LIGATION     Family History Family  History  Problem Relation Age of Onset   Stroke Mother    Lung cancer Father     Social History Social History   Tobacco Use   Smoking status: Former    Current packs/day: 0.00    Average packs/day: 0.5 packs/day for 35.0 years (17.5 ttl pk-yrs)    Types: Cigarettes    Start date: 10/27/1978    Quit date: 10/26/2013    Years since quitting: 9.5   Smokeless tobacco: Never  Vaping Use   Vaping status: Some Days   Substances: Nicotine, Flavoring  Substance Use Topics   Alcohol use: No    Alcohol/week: 0.0 standard drinks of alcohol    Comment: "stopped drinking  in 2004"   Drug use: No    Types: "Crack" cocaine, Marijuana    Comment: 10/30/2013 "last crack in 2014; last marijuana in ~ 1990" drug free for last 3 years    Allergies Heparin, Amitriptyline hcl, Azithromycin, Fentanyl, Cymbalta [duloxetine hcl], and Neomycin-bacitracin zn-polymyx  Review of Systems A thorough review of systems was obtained and all systems are negative except as noted in the HPI and PMH.   Physical Exam Vital Signs  I have reviewed the triage vital signs BP (!) 140/83 (BP Location: Left Arm)   Pulse 77   Temp 97.9 F (36.6 C) (Oral)   Resp (!) 22   SpO2 99%  Physical Exam Vitals and nursing note reviewed.  Constitutional:  General: She is not in acute distress.    Appearance: Normal appearance. She is obese.  HENT:     Head: Normocephalic and atraumatic.     Right Ear: External ear normal.     Left Ear: External ear normal.     Nose: Nose normal.     Mouth/Throat:     Mouth: Mucous membranes are moist.  Eyes:     General: No scleral icterus.       Right eye: No discharge.        Left eye: No discharge.  Cardiovascular:     Rate and Rhythm: Normal rate and regular rhythm.     Pulses: Normal pulses.     Heart sounds: Normal heart sounds.  Pulmonary:     Effort: Pulmonary effort is normal. No respiratory distress.     Breath sounds: No stridor. Decreased breath sounds present.   Abdominal:     General: Abdomen is flat. There is no distension.     Palpations: Abdomen is soft.     Tenderness: There is no abdominal tenderness.  Musculoskeletal:     Cervical back: No rigidity.     Right lower leg: No edema.     Left lower leg: No edema.       Legs:  Skin:    General: Skin is warm and dry.     Capillary Refill: Capillary refill takes less than 2 seconds.  Neurological:     Mental Status: She is alert.  Psychiatric:        Mood and Affect: Mood normal.        Behavior: Behavior normal. Behavior is cooperative.     ED Results and Treatments Labs (all labs ordered are listed, but only abnormal results are displayed) Labs Reviewed  COMPREHENSIVE METABOLIC PANEL WITH GFR - Abnormal; Notable for the following components:      Result Value   Glucose, Bld 120 (*)    BUN 7 (*)    Creatinine, Ser 1.13 (*)    Albumin 2.9 (*)    GFR, Estimated 54 (*)    All other components within normal limits  CBC WITH DIFFERENTIAL/PLATELET - Abnormal; Notable for the following components:   RBC 3.84 (*)    Hemoglobin 10.3 (*)    HCT 33.7 (*)    Platelets 136 (*)    All other components within normal limits  RESP PANEL BY RT-PCR (RSV, FLU A&B, COVID)  RVPGX2  TROPONIN I (HIGH SENSITIVITY)  TROPONIN I (HIGH SENSITIVITY)                                                                                                                          Radiology CT Angio Chest Pulmonary Embolism (PE) W or WO Contrast Result Date: 05/05/2023 CLINICAL DATA:  Pulmonary embolism suspected, high probability EXAM: CT ANGIOGRAPHY CHEST WITH CONTRAST TECHNIQUE: Multidetector CT imaging of the chest was performed using the standard protocol during bolus administration of intravenous contrast. Multiplanar CT image  reconstructions and MIPs were obtained to evaluate the vascular anatomy. RADIATION DOSE REDUCTION: This exam was performed according to the departmental dose-optimization program which  includes automated exposure control, adjustment of the mA and/or kV according to patient size and/or use of iterative reconstruction technique. CONTRAST:  75mL OMNIPAQUE IOHEXOL 350 MG/ML SOLN COMPARISON:  08/09/2022 FINDINGS: Cardiovascular: Satisfactory opacification of the pulmonary arteries to the segmental level. No evidence of pulmonary embolism. Normal heart size. No pericardial effusion. No residual clot seen in right lower lobe branches. Unchanged dilatation of the pulmonary arteries, especially the left main pulmonary artery. Mediastinum/Nodes: Negative for mass, adenopathy, or soft a GIA thickening. Lungs/Pleura: Generalized airway thickening with areas of mild scarring or atelectasis. There is no edema, consolidation, effusion, or pneumothorax. Motion artifact affects visualization of the lung bases. Upper Abdomen: Cirrhosis and splenomegaly. Musculoskeletal: Generalized degeneration of the thoracic spine. Bilateral glenohumeral osteoarthritis. Review of the MIP images confirms the above findings. IMPRESSION: Negative for pulmonary embolism or other acute finding. Cirrhosis Electronically Signed   By: Ronnette Coke M.D.   On: 05/05/2023 04:32   DG Chest Port 1 View Result Date: 05/05/2023 CLINICAL DATA:  dib EXAM: PORTABLE CHEST 1 VIEW COMPARISON:  Chest x-ray 01/13/2023, CT chest 08/09/2022 FINDINGS: The heart and mediastinal contours are unchanged. Question nodular consolidation at the left base. No pulmonary edema. No pleural effusion. No pneumothorax. No acute osseous abnormality. Bilateral severe degenerative changes of the shoulders. IMPRESSION: Question nodular density at the left base. Finding could represent atelectasis versus true consolidation or nodule. Followup PA and lateral chest X-ray is recommended in 3-4 weeks following therapy to ensure resolution and exclude underlying malignancy. Electronically Signed   By: Morgane  Naveau M.D.   On: 05/05/2023 02:00    Pertinent labs &  imaging results that were available during my care of the patient were reviewed by me and considered in my medical decision making (see MDM for details).  Medications Ordered in ED Medications  morphine (PF) 4 MG/ML injection 4 mg (4 mg Intravenous Given 05/05/23 0227)  ondansetron (ZOFRAN) injection 4 mg (4 mg Intravenous Given 05/05/23 0228)  sodium chloride 0.9 % bolus 500 mL (0 mLs Intravenous Stopped 05/05/23 0407)  iohexol (OMNIPAQUE) 350 MG/ML injection 75 mL (75 mLs Intravenous Contrast Given 05/05/23 0400)  ipratropium-albuterol (DUONEB) 0.5-2.5 (3) MG/3ML nebulizer solution 3 mL (3 mLs Nebulization Given 05/05/23 0508)  oxyCODONE (Oxy IR/ROXICODONE) immediate release tablet 5 mg (5 mg Oral Given 05/05/23 0522)                                                                                                                                     Procedures Procedures  (including critical care time)  Medical Decision Making / ED Course    Medical Decision Making:    Bernardina MCKINZEE SPIRITO is a 68 y.o. female with past medical history as below, significant for bipolar disorder, DVT on  xarelto, HTN,  GERD, copd, ckd who presents to the ED with complaint of dib, leg pain. The complaint involves an extensive differential diagnosis and also carries with it a high risk of complications and morbidity.  Serious etiology was considered. Ddx includes but is not limited to: In my evaluation of this patient's dyspnea my DDx includes, but is not limited to, pneumonia, pulmonary embolism, pneumothorax, pulmonary edema, metabolic acidosis, asthma, COPD, cardiac cause, anemia, anxiety, etc.    Complete initial physical exam performed, notably the patient was in no distress no hypoxia.    Reviewed and confirmed nursing documentation for past medical history, family history, social history.  Vital signs reviewed.     Clinical Course as of 05/05/23 0603  Sat May 05, 2023  0459 CT PE neg [SG]  0540 Feeling better  [SG]  0559 Hemoglobin(!): 10.3 Mildly reduced from prior, denies bleeding [SG]    Clinical Course User Index [SG] Teddi Favors, DO    Brief summary: [age]-year-old female here with right leg pain, cough, dyspnea.  She has some swelling to her right lower extremity ongoing over the past year, unchanged in the past few weeks with the patient.  She had recent lower extremity duplex which was negative per the patient.  She has no hypoxia, does have a cough, no significant wheezing.  Will get screening labs, chest imaging, reassess.  Labs and imaging reviewed, these are stable.  Feeling much better after nebulized breathing treatment and analgesic.  Favor her cough and dyspnea likely secondary to mild COPD exacerbation.  Will start albuterol at home, steroids, azithromycin, follow-up with PCP.  She has chronic swelling to her right calf, she is following with vascular surgery and orthopedics; symptoms ongoing for over a year. LE NVI, unchanged >6 mos, recent duplex, unclear etiology, recommend further eval outpatient    The patient improved significantly and was discharged in stable condition. Detailed discussions were had with the patient/guardian regarding current findings, and need for close f/u with PCP or on call doctor. The patient/guardian has been instructed to return immediately if the symptoms worsen in any way for re-evaluation. Patient/guardian verbalized understanding and is in agreement with current care plan. All questions answered prior to discharge.               Additional history obtained: -Additional history obtained from na -External records from outside source obtained and reviewed including: Chart review including previous notes, labs, imaging, consultation notes including  PDMP, prior labs/imaging    Lab Tests: -I ordered, reviewed, and interpreted labs.   The pertinent results include:   Labs Reviewed  COMPREHENSIVE METABOLIC PANEL WITH GFR - Abnormal;  Notable for the following components:      Result Value   Glucose, Bld 120 (*)    BUN 7 (*)    Creatinine, Ser 1.13 (*)    Albumin 2.9 (*)    GFR, Estimated 54 (*)    All other components within normal limits  CBC WITH DIFFERENTIAL/PLATELET - Abnormal; Notable for the following components:   RBC 3.84 (*)    Hemoglobin 10.3 (*)    HCT 33.7 (*)    Platelets 136 (*)    All other components within normal limits  RESP PANEL BY RT-PCR (RSV, FLU A&B, COVID)  RVPGX2  TROPONIN I (HIGH SENSITIVITY)  TROPONIN I (HIGH SENSITIVITY)    Notable for labs stable  EKG   EKG Interpretation Date/Time:  Saturday May 05 2023 01:37:47 EDT Ventricular Rate:  77 PR Interval:  141  QRS Duration:  100 QT Interval:  391 QTC Calculation: 443 R Axis:   -26  Text Interpretation: Sinus rhythm LVH with secondary repolarization abnormality Confirmed by Russella Courts ([id number]) on 05/05/2023 3:41:27 AM         Imaging Studies ordered: I ordered imaging studies including CXR CTPE I independently visualized the following imaging with scope of interpretation limited to determining acute life threatening conditions related to emergency care; findings noted above I independently visualized and interpreted imaging. I agree with the radiologist interpretation   Medicines ordered and prescription drug management: Meds ordered this encounter  Medications   morphine (PF) 4 MG/ML injection 4 mg   ondansetron (ZOFRAN) injection 4 mg   sodium chloride 0.9 % bolus 500 mL   iohexol (OMNIPAQUE) 350 MG/ML injection 75 mL   DISCONTD: HYDROcodone-acetaminophen (NORCO/VICODIN) 5-325 MG per tablet 1 tablet    Refill:  0   ipratropium-albuterol (DUONEB) 0.5-2.5 (3) MG/3ML nebulizer solution 3 mL   oxyCODONE (Oxy IR/ROXICODONE) immediate release tablet 5 mg    Refill:  0   albuterol (VENTOLIN HFA) 108 (90 Base) MCG/ACT inhaler    Sig: Inhale 2 puffs into the lungs every 4 (four) hours as needed for wheezing or shortness of  breath.    Dispense:  1 each    Refill:  0   budesonide-formoterol (SYMBICORT) 80-4.5 MCG/ACT inhaler    Sig: Inhale 2 puffs into the lungs in the morning and at bedtime.    Dispense:  1 each    Refill:  12   oxyCODONE (ROXICODONE) 5 MG immediate release tablet    Sig: Take 1 tablet (5 mg total) by mouth every 6 (six) hours as needed for severe pain (pain score 7-10).    Dispense:  5 tablet    Refill:  0   doxycycline (VIBRAMYCIN) 100 MG capsule    Sig: Take 1 capsule (100 mg total) by mouth 2 (two) times daily for 7 days.    Dispense:  14 capsule    Refill:  0   methylPREDNISolone (MEDROL DOSEPAK) 4 MG TBPK tablet    Sig: Per manufacturers instructions    Dispense:  1 each    Refill:  0    -I have reviewed the patients home medicines and have made adjustments as needed   Consultations Obtained: na   Cardiac Monitoring: The patient was maintained on a cardiac monitor.  I personally viewed and interpreted the cardiac monitored which showed an underlying rhythm of: nsr Continuous pulse oximetry interpreted by myself, 99% on RA.    Social Determinants of Health:  Diagnosis or treatment significantly limited by social determinants of health: former smoker and obesity   Reevaluation: After the interventions noted above, I reevaluated the patient and found that they have improved  Co morbidities that complicate the patient evaluation  Past Medical History:  Diagnosis Date   Agoraphobia with panic attacks    Altered mental state 10/26/2013   Ankle fracture, left 11/27/2010   S/p ORIF 11/2    Anxiety    Arthritis    "knees; lower back" (10/30/2013)   Bipolar disorder (HCC)    Cavitary pneumonia    Cirrhosis (HCC)    CKD (chronic kidney disease), stage III (HCC)    COPD (chronic obstructive pulmonary disease) (HCC)    Depression    DVT (deep venous thrombosis) (HCC) 09/2013   RLE   GERD (gastroesophageal reflux disease)    Heart murmur    Hepatitis B    History of  blood transfusion    "due to excessive blood loss before hysterectomy"   History of urinary tract infection    Hypertension    currently on no medication    Kidney stones    Pneumonia    "4 times in the past year" (10/30/2013)   Pulmonary emboli (HCC) 09/2013   Reactive airway disease 08/24/2022   Shortness of breath dyspnea    increased exertion;exercise   Urinary frequency    Urinary incontinence       Dispostion: Disposition decision including need for hospitalization was considered, and patient discharged from emergency department.    Final Clinical Impression(s) / ED Diagnoses Final diagnoses:  COPD exacerbation (HCC)  Pain and swelling of right lower leg        Teddi Favors, DO 05/05/23 0600    Teddi Favors, DO 05/05/23 [phone]

## 2023-05-05 NOTE — ED Triage Notes (Addendum)
 Pt with PMX HTN, DVT, COPD, CHF BIBA from home with c/o progressing right calf pain, pitting edema to BLE, SHOB with productive cough, clear phlegm, and reports of slight distension to abdomen, nonrigid. EMS noted inspiratory/expiratory wheezing to BL upper lobes, albuterol 10mg  administered. Pedals pulses present, skin warm and dry. Pt also endorses right lower back pain.

## 2023-05-05 NOTE — ED Notes (Signed)
 ED Provider at bedside.

## 2023-05-05 NOTE — ED Notes (Signed)
 CCMD notified for continuous cardiac monitoring.

## 2023-05-05 NOTE — Discharge Instructions (Signed)
 It was a pleasure caring for you today in the emergency department.  Please follow up with your primary care specialists  Please return to the emergency department for any worsening or worrisome symptoms.

## 2023-05-07 ENCOUNTER — Telehealth: Payer: Self-pay | Admitting: Surgical

## 2023-05-07 ENCOUNTER — Ambulatory Visit: Admitting: Surgical

## 2023-05-07 DIAGNOSIS — Z86718 Personal history of other venous thrombosis and embolism: Secondary | ICD-10-CM

## 2023-05-07 DIAGNOSIS — R2243 Localized swelling, mass and lump, lower limb, bilateral: Secondary | ICD-10-CM

## 2023-05-07 NOTE — Telephone Encounter (Signed)
 We saw her for bilateral lower extremity swelling that is worst in her right leg.  She had ultrasound ordered demonstrating no DVT.  She had discussed with her PCP about vascular surgery referral and so it sounds like this was the next step in her evaluation.  Okay from my perspective to put in a vascular surgery referral so that she can be evaluated.

## 2023-05-07 NOTE — Addendum Note (Signed)
 Addended by: Acey Ace on: 05/07/2023 02:58 PM   Modules accepted: Orders

## 2023-05-07 NOTE — Telephone Encounter (Signed)
 Patient called and the doctor told her to stay off her legs. Also, she went to the ER and they said tell him that she needs to be seen for poor circulation in her legs. [phone]

## 2023-05-07 NOTE — Telephone Encounter (Signed)
Referral entered. I called patient and advised.

## 2023-05-11 ENCOUNTER — Emergency Department (HOSPITAL_COMMUNITY)

## 2023-05-11 ENCOUNTER — Emergency Department (HOSPITAL_COMMUNITY)
Admission: EM | Admit: 2023-05-11 | Discharge: 2023-05-12 | Disposition: A | Discharge status: Home / Self Care | Attending: Emergency Medicine | Admitting: Emergency Medicine

## 2023-05-11 ENCOUNTER — Encounter (HOSPITAL_COMMUNITY): Payer: Self-pay | Admitting: Emergency Medicine

## 2023-05-11 DIAGNOSIS — R0602 Shortness of breath: Secondary | ICD-10-CM | POA: Diagnosis present

## 2023-05-11 DIAGNOSIS — M25551 Pain in right hip: Secondary | ICD-10-CM | POA: Insufficient documentation

## 2023-05-11 DIAGNOSIS — M79604 Pain in right leg: Secondary | ICD-10-CM | POA: Diagnosis present

## 2023-05-11 DIAGNOSIS — M79605 Pain in left leg: Secondary | ICD-10-CM | POA: Insufficient documentation

## 2023-05-11 DIAGNOSIS — R059 Cough, unspecified: Secondary | ICD-10-CM | POA: Diagnosis not present

## 2023-05-11 LAB — CBC WITH DIFFERENTIAL/PLATELET
Abs Immature Granulocytes: 0.03 10*3/uL (ref 0.00–0.07)
Basophils Absolute: 0 10*3/uL (ref 0.0–0.1)
Basophils Relative: 0 %
Eosinophils Absolute: 0 10*3/uL (ref 0.0–0.5)
Eosinophils Relative: 0 %
HCT: 34.1 % — ABNORMAL LOW (ref 36.0–46.0)
Hemoglobin: 10.7 g/dL — ABNORMAL LOW (ref 12.0–15.0)
Immature Granulocytes: 0 %
Lymphocytes Relative: 25 %
Lymphs Abs: 1.9 10*3/uL (ref 0.7–4.0)
MCH: 26.9 pg (ref 26.0–34.0)
MCHC: 31.4 g/dL (ref 30.0–36.0)
MCV: 85.7 fL (ref 80.0–100.0)
Monocytes Absolute: 0.6 10*3/uL (ref 0.1–1.0)
Monocytes Relative: 8 %
Neutro Abs: 5 10*3/uL (ref 1.7–7.7)
Neutrophils Relative %: 67 %
Platelets: 153 10*3/uL (ref 150–400)
RBC: 3.98 MIL/uL (ref 3.87–5.11)
RDW: 14.1 % (ref 11.5–15.5)
WBC: 7.5 10*3/uL (ref 4.0–10.5)
nRBC: 0 % (ref 0.0–0.2)

## 2023-05-11 LAB — RESP PANEL BY RT-PCR (RSV, FLU A&B, COVID)  RVPGX2
Influenza A by PCR: NEGATIVE
Influenza B by PCR: NEGATIVE
Resp Syncytial Virus by PCR: NEGATIVE
SARS Coronavirus 2 by RT PCR: NEGATIVE

## 2023-05-11 LAB — BASIC METABOLIC PANEL WITH GFR
Anion gap: 10 (ref 5–15)
BUN: 20 mg/dL (ref 8–23)
CO2: 22 mmol/L (ref 22–32)
Calcium: 9.4 mg/dL (ref 8.9–10.3)
Chloride: 103 mmol/L (ref 98–111)
Creatinine, Ser: 1.13 mg/dL — ABNORMAL HIGH (ref 0.44–1.00)
GFR, Estimated: 54 mL/min — ABNORMAL LOW (ref >60)
Glucose, Bld: 88 mg/dL (ref 70–99)
Potassium: 4 mmol/L (ref 3.5–5.1)
Sodium: 135 mmol/L (ref 135–145)

## 2023-05-11 LAB — TROPONIN I (HIGH SENSITIVITY): Troponin I (High Sensitivity): 7 ng/L (ref <18)

## 2023-05-11 MED ORDER — OXYCODONE HCL 5 MG PO TABS
5.0000 mg | ORAL_TABLET | Freq: Once | ORAL | Status: AC
  Administered 2023-05-11: 5 mg via ORAL
  Filled 2023-05-11: qty 1

## 2023-05-11 MED ORDER — OXYCODONE HCL 5 MG PO TABS: 5.0000 mg | ORAL_TABLET | Freq: Four times a day (QID) | ORAL | 0 refills | Status: DC | PRN

## 2023-05-11 NOTE — ED Notes (Signed)
 ED Provider at bedside.

## 2023-05-11 NOTE — ED Triage Notes (Signed)
 Patient with history of PE's - bilateral lower leg pain that has been on-going. Patient states she can not walk now, due to the pain. Patient is currently on steroids & ABX for lung illness - see previous encounter. Patient is on [place] oxygen  for comfort, still with cough & mild shortness of breath with exertion.

## 2023-05-11 NOTE — ED Provider Notes (Signed)
 [place] [facility] Provider Note   CSN: [id number] Arrival date & time: 05/11/23  2045     History Chief Complaint  Patient presents with   Shortness of Breath   Leg Pain    HPI [name] is a 68 y.o. female presenting for chief complaint of right leg pain States that it has been going on for a year and she is having right hip pain.  States that when she tries to get up and move it is severe and tonight she can't walk  Seen PCP/Orthopedic and chronic back pain.  CLD/HTN/PSA  Patient's recorded medical, surgical, social, medication list and allergies were reviewed in the Snapshot window as part of the initial history.   Review of Systems   Review of Systems  Constitutional:  Negative for chills and fever.  HENT:  Negative for ear pain and sore throat.   Eyes:  Negative for pain and visual disturbance.  Respiratory:  Negative for cough and shortness of breath.   Cardiovascular:  Negative for chest pain and palpitations.  Gastrointestinal:  Negative for abdominal pain and vomiting.  Genitourinary:  Negative for dysuria and hematuria.  Musculoskeletal:  Negative for arthralgias and back pain.  Skin:  Negative for color change and rash.  Neurological:  Negative for seizures and syncope.  All other systems reviewed and are negative.   Physical Exam Updated Vital Signs BP (!) 154/84   Pulse (!) 58   Temp 98.2 F (36.8 C) (Oral)   Resp 18   Ht 5\' 4"  (1.626 m)   Wt 104 kg   SpO2 98%   BMI 39.36 kg/m  Physical Exam Vitals and nursing note reviewed.  Constitutional:      General: She is not in acute distress.    Appearance: She is well-developed.  HENT:     Head: Normocephalic and atraumatic.  Eyes:     Conjunctiva/sclera: Conjunctivae normal.  Cardiovascular:     Rate and Rhythm: Normal rate and regular rhythm.     Heart sounds: No murmur heard. Pulmonary:     Effort: Pulmonary effort is normal. No respiratory distress.      Breath sounds: Normal breath sounds.  Abdominal:     General: There is no distension.     Palpations: Abdomen is soft.     Tenderness: There is no abdominal tenderness. There is no right CVA tenderness or left CVA tenderness.  Musculoskeletal:        General: No swelling or tenderness. Normal range of motion.     Cervical back: Neck supple.  Skin:    General: Skin is warm and dry.  Neurological:     General: No focal deficit present.     Mental Status: She is alert and oriented to person, place, and time. Mental status is at baseline.     Cranial Nerves: No cranial nerve deficit.      ED Course/ Medical Decision Making/ A&P    Procedures Procedures   Medications Ordered in ED Medications  oxyCODONE  (Oxy IR/ROXICODONE ) immediate release tablet 5 mg (5 mg Oral Given 05/11/23 2127)  oxyCODONE  (Oxy IR/ROXICODONE ) immediate release tablet 5 mg (5 mg Oral Given 05/11/23 2332)    Medical Decision Making:   68 YOF with  a chief complaint of bilateral leg pain.  States it has been present for a year.  Acute on chronic.  Denies fevers chills nausea vomiting shortness of breath.  It is the entirety of both legs.  States she is seeing orthopedics.  Used to follow with pain clinic but has been lost to follow-up after her doctor retired.  Has had x-rays recently in the outpatient setting, ultrasound recently in the outpatient setting none of which diagnostic for any acute pathology.  She is ambulatory though states she needs pain medication to stay on her feet for long.  No acute distress Her history of present illness and physical exam findings are most consistent with chronic musculoskeletal pain.  Considered DVT, PE, ACS, cellulitis, pneumonia, pneumothorax and the seem grossly consistent.  X-ray performed to evaluate for intrathoracic pathology grossly negative. Telemetry was reviewed as well as her EKG and initial troponin without evidence of ACS or any other acute coronary  pathology. Her pain is controlled after p.o. oxycodone . Will refill her pain medication and recommend she follow-up with a primary care provider.  This is her second fill of pain medication from the emergency room.  I have educated her on appropriate follow-up for chronic pain and the limitations of ER evaluations for chronic pain and patient expressed understanding.  Notably, during the evaluation I was approached by nursing as patient had requested IV narcotics for management of her symptoms.  Educated patient on why this would be inappropriate for chronic pain.  Disposition:  I have considered need for hospitalization, however, considering all of the above, I believe this patient is stable for discharge at this time.  Patient/family educated about specific return precautions for given chief complaint and symptoms.  Patient/family educated about follow-up with PCP.     Patient/family expressed understanding of return precautions and need for follow-up. Patient spoken to regarding all imaging and laboratory results and appropriate follow up for these results. All education provided in verbal form with additional information in written form. Time was allowed for answering of patient questions. Patient discharged.    Emergency Department Medication Summary:   Medications  oxyCODONE  (Oxy IR/ROXICODONE ) immediate release tablet 5 mg (5 mg Oral Given 05/11/23 2127)  oxyCODONE  (Oxy IR/ROXICODONE ) immediate release tablet 5 mg (5 mg Oral Given 05/11/23 2332)        Clinical Impression:  1. Pain of right lower extremity      Discharge   Final Clinical Impression(s) / ED Diagnoses Final diagnoses:  Pain of right lower extremity    Rx / DC Orders ED Discharge Orders          Ordered    oxyCODONE  (ROXICODONE ) 5 MG immediate release tablet  Every 6 hours PRN        05/11/23 2329              Onetha Bile, MD 05/11/23 2334

## 2023-05-12 DIAGNOSIS — M79604 Pain in right leg: Secondary | ICD-10-CM | POA: Diagnosis not present

## 2023-05-12 MED ORDER — OXYCODONE HCL 5 MG PO TABS
5.0000 mg | ORAL_TABLET | Freq: Once | ORAL | Status: AC
  Administered 2023-05-12: 5 mg via ORAL
  Filled 2023-05-12: qty 1

## 2023-05-12 NOTE — ED Notes (Signed)
 Patient assisted to bedside commode; stand-by assist; able to bare weight & pivot to bedside commode.

## 2023-05-14 ENCOUNTER — Ambulatory Visit: Payer: 59 | Admitting: Diagnostic Neuroimaging

## 2023-05-17 NOTE — Telephone Encounter (Signed)
 VOB has been submitted for Durolane, right knee

## 2023-06-08 ENCOUNTER — Other Ambulatory Visit: Payer: Self-pay | Admitting: *Deleted

## 2023-06-08 DIAGNOSIS — M7989 Other specified soft tissue disorders: Secondary | ICD-10-CM

## 2023-06-18 NOTE — Progress Notes (Deleted)
 [facility] [place]  ASSESSMENT / PLAN: 68 y.o. female with *** - ***  CHIEF COMPLAINT: ***  HISTORY OF PRESENT ILLNESS: [name] is a 68 y.o. female ***  VASCULAR SURGICAL HISTORY: ***  VASCULAR RISK FACTORS: {FINDINGS; POSITIVE [phone]} history of stroke / transient ischemic attack. {FINDINGS; POSITIVE [phone]} history of coronary artery disease. *** history of PCI. *** history of CABG.  {FINDINGS; POSITIVE [phone]} history of diabetes mellitus. Last A1c ***. {FINDINGS; POSITIVE [phone]} history of smoking. *** actively smoking. {FINDINGS; POSITIVE [phone]} history of hypertension. *** drug regimen with *** control. {FINDINGS; POSITIVE [phone]} history of chronic kidney disease.  Last GFR ***. CKD {stage:30421363}. {FINDINGS; POSITIVE [phone]} history of chronic obstructive pulmonary disease, treated with ***.  FUNCTIONAL STATUS: ECOG performance status: {findings; ecog performance status:31780} Ambulatory status: {TNHAmbulation:25868}  CAREY 1 AND 3 YEAR INDEX Female (2pts) 75-79 or 80-84 (2pts) >84 (3pts) Dependence in toileting (1pt) Partial or full dependence in dressing (1pt) History of malignant neoplasm (2pts) CHF (3pts) COPD (1pts) CKD (3pts)  0-3 pts 6% 1 year mortality ; 21% 3 year mortality 4-5 pts 12% 1 year mortality ; 36% 3 year mortality >5 pts 21% 1 year mortality; 54% 3 year mortality   Past Medical History:  Diagnosis Date   Agoraphobia with panic attacks    Altered mental state 10/26/2013   Ankle fracture, left 11/27/2010   S/p ORIF 11/2    Anxiety    Arthritis    "knees; lower back" (10/30/2013)   Bipolar disorder (HCC)    Cavitary pneumonia    Cirrhosis (HCC)    CKD (chronic kidney disease), stage III (HCC)    COPD (chronic obstructive pulmonary disease) (HCC)    Depression    DVT (deep venous thrombosis) (HCC) 09/2013   RLE   GERD  (gastroesophageal reflux disease)    Heart murmur    Hepatitis B    History of blood transfusion    "due to excessive blood loss before hysterectomy"   History of urinary tract infection    Hypertension    currently on no medication    Kidney stones    Pneumonia    "4 times in the past year" (10/30/2013)   Pulmonary emboli (HCC) 09/2013   Reactive airway disease 08/24/2022   Shortness of breath dyspnea    increased exertion;exercise   Urinary frequency    Urinary incontinence     Past Surgical History:  Procedure Laterality Date   ABDOMINAL HYSTERECTOMY     ANKLE FRACTURE SURGERY Left    BIOPSY  06/26/2022   Procedure: BIOPSY;  Surgeon: Ozell Blunt, MD;  Location: [facility];  Service: Gastroenterology;;   CARPAL TUNNEL RELEASE Left    DILATION AND CURETTAGE OF UTERUS  X 2   ESOPHAGOGASTRODUODENOSCOPY (EGD) WITH PROPOFOL  N/A 06/26/2022   Procedure: ESOPHAGOGASTRODUODENOSCOPY (EGD) WITH PROPOFOL ;  Surgeon: Ozell Blunt, MD;  Location: [facility];  Service: Gastroenterology;  Laterality: N/A;   FRACTURE SURGERY     HAND SURGERY     1980's/left hand    INSERTION OF MESH N/A 12/10/2014   Procedure: INSERTION OF MESH;  Surgeon: Candyce Champagne, MD;  Location: [facility];  Service: General;  Laterality: N/A;   LAPAROSCOPIC ASSISTED VENTRAL HERNIA REPAIR N/A 12/10/2014   Procedure: LAPAROSCOPIC ASSISTED REPAIR OF INCARCERATED UMBILICAL HERNIA ;  Surgeon: Candyce Champagne, MD;  Location: [facility];  Service: General;  Laterality: N/A;   TUBAL LIGATION      Family History  Problem Relation Age of Onset  Stroke Mother    Lung cancer Father     Social History   Socioeconomic History   Marital status: Single    Spouse name: Not on file   Number of children: Not on file   Years of education: Not on file   Highest education level: Not on file  Occupational History   Not on file  Tobacco Use   Smoking status: Former    Current packs/day: 0.00    Average packs/day: 0.5 packs/day for 35.0  years (17.5 ttl pk-yrs)    Types: Cigarettes    Start date: 10/27/1978    Quit date: 10/26/2013    Years since quitting: 9.6   Smokeless tobacco: Never  Vaping Use   Vaping status: Some Days   Substances: Nicotine , Flavoring  Substance and Sexual Activity   Alcohol use: No    Alcohol/week: 0.0 standard drinks of alcohol    Comment: "stopped drinking  in 2004"   Drug use: No    Types: "Crack" cocaine, Marijuana    Comment: 10/30/2013 "last crack in 2014; last marijuana in ~ 1990" drug free for last 3 years    Sexual activity: Never  Other Topics Concern   Not on file  Social History Narrative   Not on file   Social Drivers of Health   Financial Resource Strain: Low Risk  (04/16/2023)   Received from [facility]   Overall Financial Resource Strain (CARDIA)    Difficulty of Paying Living Expenses: Not hard at all  Food Insecurity: Food Insecurity Present (04/16/2023)   Received from [facility]   Hunger Vital Sign    Worried About Running Out of Food in the Last Year: Sometimes true    Ran Out of Food in the Last Year: Sometimes true  Transportation Needs: No Transportation Needs (04/16/2023)   Received from [facility] - Transportation    Lack of Transportation (Medical): No    Lack of Transportation (Non-Medical): No  Physical Activity: Unknown (12/05/2021)   Received from [facility]   Exercise Vital Sign    Days of Exercise per Week: 0 days    Minutes of Exercise per Session: Not on file  Stress: No Stress Concern Present (12/05/2021)   Received from [facility] - Occupational Stress Questionnaire    Feeling of Stress : Only a little  Social Connections: Socially Integrated (12/05/2021)   Received from [facility], [facility]   Social Network    How would you rate your social network (family, work, friends)?: Good participation with social networks  Intimate Partner  Violence: Not At Risk (08/24/2022)   Humiliation, Afraid, Rape, and Kick questionnaire    Fear of Current or Ex-Partner: No    Emotionally Abused: No    Physically Abused: No    Sexually Abused: No    Allergies  Allergen Reactions   Heparin  Hives    Pt states she cannot take because she will break out in hives   Amitriptyline  Hcl Other (See Comments)     Causes her to be very "disoriented".   Azithromycin  Rash   Fentanyl  Other (See Comments)    Sees things Pt stated had hallucinations "d/t taking high dosage, but can tolerate low dosage"    Cymbalta [Duloxetine Hcl] Other (See Comments)    Headaches    Neomycin-Bacitracin Zn-Polymyx Rash    Current Outpatient Medications  Medication Sig Dispense Refill   albuterol  (PROAIR  HFA) 108 (  90 Base) MCG/ACT inhaler INHALE 2 PUFFS INTO THE LUNGS EVERY 4 HOURS AS NEEDED FOR WHEEZING (Patient taking differently: Inhale 2 puffs into the lungs every 4 (four) hours as needed for shortness of breath or wheezing.) 1 g 6   albuterol  (PROVENTIL ) (2.5 MG/3ML) 0.083% nebulizer solution Take 3 mLs (2.5 mg total) by nebulization every 4 (four) hours as needed for wheezing or shortness of breath. (Patient taking differently: Take 2.5 mg by nebulization as needed for wheezing or shortness of breath.) 75 mL 1   albuterol  (VENTOLIN  HFA) 108 (90 Base) MCG/ACT inhaler Inhale 2 puffs into the lungs every 4 (four) hours as needed for wheezing or shortness of breath. 1 each 1   albuterol  (VENTOLIN  HFA) 108 (90 Base) MCG/ACT inhaler Inhale 2 puffs into the lungs every 4 (four) hours as needed for wheezing or shortness of breath. 1 each 0   ALPRAZolam  (XANAX ) 1 MG tablet Take 1 tablet (1 mg total) by mouth 3 (three) times daily. (Patient taking differently: Take 1 mg by mouth 3 (three) times daily as needed for anxiety.)     budesonide -formoterol  (SYMBICORT ) 160-4.5 MCG/ACT inhaler Inhale 2 puffs into the lungs 2 (two) times daily. (Patient taking differently: Inhale 2  puffs into the lungs in the morning and at bedtime.) 1 Inhaler 6   budesonide -formoterol  (SYMBICORT ) 80-4.5 MCG/ACT inhaler Inhale 2 puffs into the lungs in the morning and at bedtime. 1 each 12   buPROPion  (WELLBUTRIN  XL) 150 MG 24 hr tablet Take 150 mg by mouth every morning.     carvedilol  (COREG ) 12.5 MG tablet Take 1 tablet (12.5 mg total) by mouth 2 (two) times daily with a meal. 60 tablet 2   Cholecalciferol  (VITAMIN D3) 50 MCG (2000 UT) TABS Take 2,000 Units by mouth daily.     clotrimazole -betamethasone  (LOTRISONE ) cream Apply to affected area 2 times daily prn (Patient taking differently: Apply 1 application  topically as needed (rash).) 15 g 0   docusate sodium  (COLACE) 100 MG capsule Take 100 mg by mouth 2 (two) times daily.     doxepin  (SINEQUAN ) 100 MG capsule Take 100 mg by mouth at bedtime.     entecavir  (BARACLUDE ) 0.5 MG tablet Take 0.5 mg by mouth every other day.     famotidine  (PEPCID ) 20 MG tablet Take 20 mg by mouth 2 (two) times daily.     ferrous sulfate (FEROSUL) 325 (65 FE) MG tablet Take 325 mg by mouth daily.     furosemide  (LASIX ) 20 MG tablet Take 20 mg by mouth daily.     gabapentin  (NEURONTIN ) 300 MG capsule Take 300 mg by mouth 3 (three) times daily.     levocetirizine (XYZAL) 5 MG tablet Take 5 mg by mouth every evening.     methylPREDNISolone  (MEDROL  DOSEPAK) 4 MG TBPK tablet Per manufacturers instructions 1 each 0   montelukast  (SINGULAIR ) 10 MG tablet Take 10 mg by mouth daily.     naloxone  (NARCAN ) nasal spray 4 mg/0.1 mL Place into the nose.     NYSTATIN powder Apply 1 Application topically as needed (yeast).     ondansetron  (ZOFRAN -ODT) 8 MG disintegrating tablet Take 8 mg by mouth every 8 (eight) hours as needed for nausea or vomiting.     oxyCODONE  (ROXICODONE ) 5 MG immediate release tablet Take 1 tablet (5 mg total) by mouth every 6 (six) hours as needed for severe pain (pain score 7-10). 15 tablet 0   pantoprazole  (PROTONIX ) 40 MG tablet Take 40 mg by  mouth  2 (two) times daily before a meal.  0   RIVAROXABAN  (XARELTO ) VTE STARTER PACK (15 & 20 MG) Follow package directions: Take one 15mg  tablet by mouth twice a day. On day 22, switch to one 20mg  tablet once a day. Take with food. 51 each 0   spironolactone  (ALDACTONE ) 25 MG tablet Take 1 tablet (25 mg total) by mouth daily. 30 tablet 2   sucralfate  (CARAFATE ) 1 g tablet Take 1 g by mouth 4 (four) times daily.     valACYclovir (VALTREX) 500 MG tablet Take 500 mg by mouth daily as needed (breakout).     No current facility-administered medications for this visit.    PHYSICAL EXAM There were no vitals filed for this visit.  Constitutional: *** appearing. *** distress. Appears *** nourished.  Neurologic: CN ***. *** focal findings. *** sensory loss. Psychiatric: *** Mood and affect symmetric and appropriate. Eyes: *** No icterus. No conjunctival pallor. Ears, nose, throat: *** mucous membranes moist. Midline trachea.  Cardiac: *** rate and rhythm.  Respiratory: *** unlabored. Abdominal: *** soft, non-tender, non-distended.  Peripheral vascular: *** Extremity: *** edema. *** cyanosis. *** pallor.  Skin: *** gangrene. *** ulceration.  Lymphatic: *** Stemmer's sign. *** palpable lymphadenopathy.    PERTINENT LABORATORY AND RADIOLOGIC DATA  Most recent CBC    Latest Ref Rng & Units 05/11/2023    9:06 PM 05/05/2023    1:38 AM 01/13/2023    6:20 PM  CBC  WBC 4.0 - 10.5 K/uL 7.5  4.3  6.1   Hemoglobin 12.0 - 15.0 g/dL 16.1  09.6  04.5   Hematocrit 36.0 - 46.0 % 34.1  33.7  40.0   Platelets 150 - 400 K/uL 153  136  113      Most recent CMP    Latest Ref Rng & Units 05/11/2023    9:06 PM 05/05/2023    1:38 AM 01/13/2023    6:20 PM  CMP  Glucose 70 - 99 mg/dL 88  409  94   BUN 8 - 23 mg/dL 20  7  20    Creatinine 0.44 - 1.00 mg/dL 8.11  9.14  7.82   Sodium 135 - 145 mmol/L 135  138  139   Potassium 3.5 - 5.1 mmol/L 4.0  3.8  4.4   Chloride 98 - 111 mmol/L 103  104  102   CO2 22  - 32 mmol/L 22  26  28    Calcium 8.9 - 10.3 mg/dL 9.4  9.1  9.8   Total Protein 6.5 - 8.1 g/dL  6.6    Total Bilirubin 0.0 - 1.2 mg/dL  0.3    Alkaline Phos 38 - 126 U/L  100    AST 15 - 41 U/L  19    ALT 0 - 44 U/L  13      Renal function CrCl cannot be calculated (Patient's most recent lab result is older than the maximum 21 days allowed.).  Hgb A1c MFr Bld (%)  Date Value  06/17/2016 5.9 (H)    LDL Cholesterol  Date Value Ref Range Status  05/30/2010  0 - 99 mg/dL Final   58        Total Cholesterol/HDL:CHD Risk Coronary Heart Disease Risk Table                     Men   Women  1/2 Average Risk   3.4   3.3  Average Risk       5.0  4.4  2 X Average Risk   9.6   7.1  3 X Average Risk  23.4   11.0        Use the calculated Patient Ratio above and the CHD Risk Table to determine the patient's CHD Risk.        ATP III CLASSIFICATION (LDL):  <100     mg/dL   Optimal  324-401  mg/dL   Near or Above                    Optimal  130-159  mg/dL   Borderline  027-253  mg/dL   High  >664     mg/dL   Very High   Direct LDL  Date Value Ref Range Status  08/25/2022 101 (H) 0 - 99 mg/dL Final    Comment:    Performed at [facility], [address] [place], [place] [address]     Vascular Imaging: ***  Heber Little. Edgardo Goodwill, MD [facility] Phone Number: [phone] 06/18/2023 7:13 PM   Total time spent on preparing this encounter including chart review, data review, collecting history, examining the patient, and coordinating care: {tnhtimebilling:26202} {billinglist:27273}  Portions of this report may have been transcribed using voice recognition software.  Every effort has been made to ensure accuracy; however, inadvertent computerized transcription errors may still be present.

## 2023-06-19 ENCOUNTER — Ambulatory Visit: Admitting: Vascular Surgery

## 2023-06-19 ENCOUNTER — Ambulatory Visit (HOSPITAL_COMMUNITY): Admission: RE | Admit: 2023-06-19 | Source: Ambulatory Visit

## 2023-06-19 ENCOUNTER — Encounter: Payer: Self-pay | Admitting: Radiology

## 2023-06-19 ENCOUNTER — Other Ambulatory Visit: Payer: Self-pay | Admitting: Radiology

## 2023-06-19 DIAGNOSIS — M1711 Unilateral primary osteoarthritis, right knee: Secondary | ICD-10-CM

## 2023-06-19 NOTE — Telephone Encounter (Signed)
 Thank you :)

## 2023-06-20 ENCOUNTER — Telehealth: Payer: Self-pay | Admitting: Orthopedic Surgery

## 2023-06-20 NOTE — Telephone Encounter (Signed)
 Pt called with an FYI. Pt states she is not ready to do gel injections until she sees vein specialist and find out more about her blood circulation. No call back needed.

## 2023-06-21 NOTE — Telephone Encounter (Signed)
 Thank you :)

## 2023-07-10 ENCOUNTER — Telehealth (HOSPITAL_COMMUNITY): Payer: Self-pay | Admitting: Pharmacy Technician

## 2023-07-10 ENCOUNTER — Emergency Department (HOSPITAL_COMMUNITY)

## 2023-07-10 ENCOUNTER — Other Ambulatory Visit: Payer: Self-pay

## 2023-07-10 ENCOUNTER — Inpatient Hospital Stay (HOSPITAL_COMMUNITY)
Admission: EM | Admit: 2023-07-10 | Discharge: 2023-07-17 | DRG: 871 | Disposition: A | Discharge status: Home Health | Attending: Internal Medicine | Admitting: Internal Medicine

## 2023-07-10 ENCOUNTER — Other Ambulatory Visit (HOSPITAL_COMMUNITY): Payer: Self-pay

## 2023-07-10 ENCOUNTER — Encounter (HOSPITAL_COMMUNITY): Payer: Self-pay | Admitting: *Deleted

## 2023-07-10 DIAGNOSIS — Z801 Family history of malignant neoplasm of trachea, bronchus and lung: Secondary | ICD-10-CM

## 2023-07-10 DIAGNOSIS — A403 Sepsis due to Streptococcus pneumoniae: Secondary | ICD-10-CM | POA: Diagnosis present

## 2023-07-10 DIAGNOSIS — E871 Hypo-osmolality and hyponatremia: Secondary | ICD-10-CM | POA: Diagnosis present

## 2023-07-10 DIAGNOSIS — N179 Acute kidney failure, unspecified: Secondary | ICD-10-CM | POA: Diagnosis present

## 2023-07-10 DIAGNOSIS — M25519 Pain in unspecified shoulder: Secondary | ICD-10-CM | POA: Diagnosis present

## 2023-07-10 DIAGNOSIS — T424X5A Adverse effect of benzodiazepines, initial encounter: Secondary | ICD-10-CM | POA: Diagnosis present

## 2023-07-10 DIAGNOSIS — A419 Sepsis, unspecified organism: Secondary | ICD-10-CM | POA: Diagnosis not present

## 2023-07-10 DIAGNOSIS — R339 Retention of urine, unspecified: Secondary | ICD-10-CM | POA: Diagnosis present

## 2023-07-10 DIAGNOSIS — K7460 Unspecified cirrhosis of liver: Secondary | ICD-10-CM | POA: Diagnosis present

## 2023-07-10 DIAGNOSIS — J690 Pneumonitis due to inhalation of food and vomit: Secondary | ICD-10-CM | POA: Diagnosis present

## 2023-07-10 DIAGNOSIS — Z87442 Personal history of urinary calculi: Secondary | ICD-10-CM

## 2023-07-10 DIAGNOSIS — D6959 Other secondary thrombocytopenia: Secondary | ICD-10-CM | POA: Diagnosis present

## 2023-07-10 DIAGNOSIS — J13 Pneumonia due to Streptococcus pneumoniae: Secondary | ICD-10-CM | POA: Diagnosis present

## 2023-07-10 DIAGNOSIS — D509 Iron deficiency anemia, unspecified: Secondary | ICD-10-CM | POA: Diagnosis present

## 2023-07-10 DIAGNOSIS — G928 Other toxic encephalopathy: Secondary | ICD-10-CM | POA: Diagnosis present

## 2023-07-10 DIAGNOSIS — Z86718 Personal history of other venous thrombosis and embolism: Secondary | ICD-10-CM

## 2023-07-10 DIAGNOSIS — N281 Cyst of kidney, acquired: Secondary | ICD-10-CM | POA: Diagnosis present

## 2023-07-10 DIAGNOSIS — R6521 Severe sepsis with septic shock: Secondary | ICD-10-CM | POA: Diagnosis present

## 2023-07-10 DIAGNOSIS — Z1152 Encounter for screening for COVID-19: Secondary | ICD-10-CM | POA: Diagnosis not present

## 2023-07-10 DIAGNOSIS — G9340 Encephalopathy, unspecified: Secondary | ICD-10-CM | POA: Diagnosis not present

## 2023-07-10 DIAGNOSIS — J9601 Acute respiratory failure with hypoxia: Secondary | ICD-10-CM | POA: Diagnosis present

## 2023-07-10 DIAGNOSIS — Z6841 Body Mass Index (BMI) 40.0 and over, adult: Secondary | ICD-10-CM | POA: Diagnosis not present

## 2023-07-10 DIAGNOSIS — G894 Chronic pain syndrome: Secondary | ICD-10-CM | POA: Diagnosis present

## 2023-07-10 DIAGNOSIS — Z79899 Other long term (current) drug therapy: Secondary | ICD-10-CM | POA: Diagnosis not present

## 2023-07-10 DIAGNOSIS — I129 Hypertensive chronic kidney disease with stage 1 through stage 4 chronic kidney disease, or unspecified chronic kidney disease: Secondary | ICD-10-CM | POA: Diagnosis present

## 2023-07-10 DIAGNOSIS — K7682 Hepatic encephalopathy: Secondary | ICD-10-CM

## 2023-07-10 DIAGNOSIS — Z7951 Long term (current) use of inhaled steroids: Secondary | ICD-10-CM

## 2023-07-10 DIAGNOSIS — B181 Chronic viral hepatitis B without delta-agent: Secondary | ICD-10-CM | POA: Diagnosis present

## 2023-07-10 DIAGNOSIS — Z888 Allergy status to other drugs, medicaments and biological substances status: Secondary | ICD-10-CM

## 2023-07-10 DIAGNOSIS — Z87891 Personal history of nicotine dependence: Secondary | ICD-10-CM | POA: Diagnosis not present

## 2023-07-10 DIAGNOSIS — M545 Low back pain, unspecified: Secondary | ICD-10-CM | POA: Diagnosis present

## 2023-07-10 DIAGNOSIS — G629 Polyneuropathy, unspecified: Secondary | ICD-10-CM | POA: Diagnosis present

## 2023-07-10 DIAGNOSIS — T50904A Poisoning by unspecified drugs, medicaments and biological substances, undetermined, initial encounter: Secondary | ICD-10-CM

## 2023-07-10 DIAGNOSIS — Z86711 Personal history of pulmonary embolism: Secondary | ICD-10-CM

## 2023-07-10 DIAGNOSIS — N182 Chronic kidney disease, stage 2 (mild): Secondary | ICD-10-CM | POA: Diagnosis present

## 2023-07-10 DIAGNOSIS — D696 Thrombocytopenia, unspecified: Secondary | ICD-10-CM | POA: Diagnosis not present

## 2023-07-10 DIAGNOSIS — E872 Acidosis, unspecified: Secondary | ICD-10-CM | POA: Diagnosis not present

## 2023-07-10 DIAGNOSIS — F4001 Agoraphobia with panic disorder: Secondary | ICD-10-CM | POA: Diagnosis present

## 2023-07-10 DIAGNOSIS — Z823 Family history of stroke: Secondary | ICD-10-CM

## 2023-07-10 DIAGNOSIS — I480 Paroxysmal atrial fibrillation: Secondary | ICD-10-CM | POA: Diagnosis not present

## 2023-07-10 DIAGNOSIS — J440 Chronic obstructive pulmonary disease with acute lower respiratory infection: Secondary | ICD-10-CM | POA: Diagnosis present

## 2023-07-10 DIAGNOSIS — Z87440 Personal history of urinary (tract) infections: Secondary | ICD-10-CM

## 2023-07-10 DIAGNOSIS — K219 Gastro-esophageal reflux disease without esophagitis: Secondary | ICD-10-CM | POA: Diagnosis present

## 2023-07-10 DIAGNOSIS — J181 Lobar pneumonia, unspecified organism: Secondary | ICD-10-CM | POA: Diagnosis not present

## 2023-07-10 DIAGNOSIS — Z90710 Acquired absence of both cervix and uterus: Secondary | ICD-10-CM

## 2023-07-10 DIAGNOSIS — Z7901 Long term (current) use of anticoagulants: Secondary | ICD-10-CM | POA: Diagnosis not present

## 2023-07-10 LAB — SAMPLE TO BLOOD BANK

## 2023-07-10 LAB — RESPIRATORY PANEL BY PCR

## 2023-07-10 LAB — I-STAT VENOUS BLOOD GAS, ED
Acid-base deficit: 2 mmol/L (ref 0.0–2.0)
Bicarbonate: 24.7 mmol/L (ref 20.0–28.0)
Calcium, Ion: 1.21 mmol/L (ref 1.15–1.40)
HCT: 32 % — ABNORMAL LOW (ref 36.0–46.0)
Hemoglobin: 10.9 g/dL — ABNORMAL LOW (ref 12.0–15.0)
O2 Saturation: 80 %
Potassium: 4.5 mmol/L (ref 3.5–5.1)
Sodium: 133 mmol/L — ABNORMAL LOW (ref 135–145)
TCO2: 26 mmol/L (ref 22–32)
pCO2, Ven: 47.4 mmHg (ref 44–60)
pH, Ven: 7.324 (ref 7.25–7.43)
pO2, Ven: 48 mmHg — ABNORMAL HIGH (ref 32–45)

## 2023-07-10 LAB — URINALYSIS, ROUTINE W REFLEX MICROSCOPIC
Bacteria, UA: NONE SEEN
Bilirubin Urine: NEGATIVE
Glucose, UA: NEGATIVE mg/dL
Ketones, ur: NEGATIVE mg/dL
Leukocytes,Ua: NEGATIVE
Nitrite: NEGATIVE
Protein, ur: NEGATIVE mg/dL
Specific Gravity, Urine: 1.02 (ref 1.005–1.030)
pH: 6 (ref 5.0–8.0)

## 2023-07-10 LAB — COMPREHENSIVE METABOLIC PANEL WITH GFR
ALT: 14 U/L (ref 0–44)
AST: 23 U/L (ref 15–41)
Albumin: 2.9 g/dL — ABNORMAL LOW (ref 3.5–5.0)
Alkaline Phosphatase: 79 U/L (ref 38–126)
Anion gap: 13 (ref 5–15)
BUN: 19 mg/dL (ref 8–23)
CO2: 21 mmol/L — ABNORMAL LOW (ref 22–32)
Calcium: 9.4 mg/dL (ref 8.9–10.3)
Chloride: 100 mmol/L (ref 98–111)
Creatinine, Ser: 2.14 mg/dL — ABNORMAL HIGH (ref 0.44–1.00)
GFR, Estimated: 25 mL/min — ABNORMAL LOW (ref >60)
Glucose, Bld: 92 mg/dL (ref 70–99)
Potassium: 4.5 mmol/L (ref 3.5–5.1)
Sodium: 134 mmol/L — ABNORMAL LOW (ref 135–145)
Total Bilirubin: 0.7 mg/dL (ref 0.0–1.2)
Total Protein: 6.2 g/dL — ABNORMAL LOW (ref 6.5–8.1)

## 2023-07-10 LAB — GLUCOSE, CAPILLARY
Glucose-Capillary: 107 mg/dL — ABNORMAL HIGH (ref 70–99)
Glucose-Capillary: 143 mg/dL — ABNORMAL HIGH (ref 70–99)
Glucose-Capillary: 143 mg/dL — ABNORMAL HIGH (ref 70–99)
Glucose-Capillary: 99 mg/dL (ref 70–99)

## 2023-07-10 LAB — RAPID URINE DRUG SCREEN, HOSP PERFORMED
Amphetamines: NOT DETECTED
Barbiturates: NOT DETECTED
Benzodiazepines: POSITIVE — AB
Cocaine: NOT DETECTED
Opiates: NOT DETECTED
Tetrahydrocannabinol: NOT DETECTED

## 2023-07-10 LAB — CBC
HCT: 34.6 % — ABNORMAL LOW (ref 36.0–46.0)
Hemoglobin: 10.4 g/dL — ABNORMAL LOW (ref 12.0–15.0)
MCH: 25.7 pg — ABNORMAL LOW (ref 26.0–34.0)
MCHC: 30.1 g/dL (ref 30.0–36.0)
MCV: 85.4 fL (ref 80.0–100.0)
Platelets: 170 10*3/uL (ref 150–400)
RBC: 4.05 MIL/uL (ref 3.87–5.11)
RDW: 15 % (ref 11.5–15.5)
WBC: 11.5 10*3/uL — ABNORMAL HIGH (ref 4.0–10.5)
nRBC: 0.2 % (ref 0.0–0.2)

## 2023-07-10 LAB — I-STAT CHEM 8, ED
BUN: 22 mg/dL (ref 8–23)
Calcium, Ion: 1.2 mmol/L (ref 1.15–1.40)
Chloride: 98 mmol/L (ref 98–111)
Creatinine, Ser: 2.2 mg/dL — ABNORMAL HIGH (ref 0.44–1.00)
Glucose, Bld: 90 mg/dL (ref 70–99)
HCT: 34 % — ABNORMAL LOW (ref 36.0–46.0)
Hemoglobin: 11.6 g/dL — ABNORMAL LOW (ref 12.0–15.0)
Potassium: 4.5 mmol/L (ref 3.5–5.1)
Sodium: 132 mmol/L — ABNORMAL LOW (ref 135–145)
TCO2: 24 mmol/L (ref 22–32)

## 2023-07-10 LAB — MRSA NEXT GEN BY PCR, NASAL: MRSA by PCR Next Gen: NOT DETECTED

## 2023-07-10 LAB — TROPONIN I (HIGH SENSITIVITY)
Troponin I (High Sensitivity): 30 ng/L — ABNORMAL HIGH (ref <18)
Troponin I (High Sensitivity): 28 ng/L — ABNORMAL HIGH (ref <18)

## 2023-07-10 LAB — I-STAT CG4 LACTIC ACID, ED
Lactic Acid, Venous: 3.9 mmol/L (ref 0.5–1.9)
Lactic Acid, Venous: 3.6 mmol/L (ref 0.5–1.9)
Lactic Acid, Venous: 3.3 mmol/L (ref 0.5–1.9)

## 2023-07-10 LAB — PROTIME-INR
INR: 2.3 — ABNORMAL HIGH (ref 0.8–1.2)
Prothrombin Time: 25.5 s — ABNORMAL HIGH (ref 11.4–15.2)

## 2023-07-10 LAB — HEMOGLOBIN A1C
Hgb A1c MFr Bld: 5.6 % (ref 4.8–5.6)
Mean Plasma Glucose: 114.02 mg/dL

## 2023-07-10 LAB — ETHANOL: Alcohol, Ethyl (B): <15 mg/dL (ref <15)

## 2023-07-10 LAB — AMMONIA: Ammonia: <13 umol/L (ref 9–35)

## 2023-07-10 LAB — STREP PNEUMONIAE URINARY ANTIGEN: Strep Pneumo Urinary Antigen: POSITIVE — AB

## 2023-07-10 LAB — SARS CORONAVIRUS 2 BY RT PCR: SARS Coronavirus 2 by RT PCR: NEGATIVE

## 2023-07-10 MED ORDER — FLUTICASONE FUROATE-VILANTEROL 100-25 MCG/ACT IN AEPB
1.0000 | INHALATION_SPRAY | Freq: Every day | RESPIRATORY_TRACT | Status: DC
  Administered 2023-07-11 – 2023-07-17 (×7): 1 via RESPIRATORY_TRACT
  Filled 2023-07-10 (×3): qty 28

## 2023-07-10 MED ORDER — NOREPINEPHRINE 4 MG/250ML-% IV SOLN
0.0000 ug/min | INTRAVENOUS | Status: DC
  Administered 2023-07-10: 5 ug/min via INTRAVENOUS

## 2023-07-10 MED ORDER — OXYCODONE HCL 5 MG PO TABS
5.0000 mg | ORAL_TABLET | ORAL | Status: DC | PRN
  Administered 2023-07-10 – 2023-07-17 (×13): 5 mg via ORAL
  Filled 2023-07-10 (×14): qty 1

## 2023-07-10 MED ORDER — NOREPINEPHRINE 4 MG/250ML-% IV SOLN
0.0000 ug/min | INTRAVENOUS | Status: DC
  Administered 2023-07-10 – 2023-07-11 (×2): 8 ug/min via INTRAVENOUS
  Filled 2023-07-10 (×2): qty 250

## 2023-07-10 MED ORDER — SODIUM CHLORIDE 0.9 % IV SOLN
250.0000 mL | INTRAVENOUS | Status: AC
  Administered 2023-07-10: 250 mL via INTRAVENOUS

## 2023-07-10 MED ORDER — PIPERACILLIN-TAZOBACTAM 3.375 G IVPB
3.3750 g | Freq: Three times a day (TID) | INTRAVENOUS | Status: DC
  Administered 2023-07-10 – 2023-07-11 (×3): 3.375 g via INTRAVENOUS
  Filled 2023-07-10 (×3): qty 50

## 2023-07-10 MED ORDER — INSULIN ASPART 100 UNIT/ML IJ SOLN
0.0000 [IU] | INTRAMUSCULAR | Status: DC
  Administered 2023-07-10 (×2): 1 [IU] via SUBCUTANEOUS

## 2023-07-10 MED ORDER — FERROUS SULFATE 325 (65 FE) MG PO TABS
325.0000 mg | ORAL_TABLET | Freq: Every day | ORAL | Status: DC
  Administered 2023-07-10 – 2023-07-17 (×8): 325 mg via ORAL
  Filled 2023-07-10 (×8): qty 1

## 2023-07-10 MED ORDER — CHLORHEXIDINE GLUCONATE CLOTH 2 % EX PADS
6.0000 | MEDICATED_PAD | Freq: Every day | CUTANEOUS | Status: DC
  Administered 2023-07-10 – 2023-07-17 (×8): 6 via TOPICAL

## 2023-07-10 MED ORDER — ENTECAVIR 0.5 MG PO TABS: 0.5000 mg | ORAL_TABLET | ORAL | Status: DC

## 2023-07-10 MED ORDER — LACTATED RINGERS IV SOLN: INTRAVENOUS | Status: DC

## 2023-07-10 MED ORDER — GABAPENTIN 100 MG PO CAPS
100.0000 mg | ORAL_CAPSULE | Freq: Three times a day (TID) | ORAL | Status: DC
  Administered 2023-07-10 – 2023-07-11 (×5): 100 mg via ORAL
  Filled 2023-07-10 (×6): qty 1

## 2023-07-10 MED ORDER — SODIUM CHLORIDE 0.9 % IV SOLN
3.0000 g | Freq: Once | INTRAVENOUS | Status: AC
  Administered 2023-07-10: 3 g via INTRAVENOUS
  Filled 2023-07-10: qty 8

## 2023-07-10 MED ORDER — ONDANSETRON HCL 4 MG/2ML IJ SOLN
4.0000 mg | Freq: Four times a day (QID) | INTRAMUSCULAR | Status: DC | PRN
Start: 2023-07-10 — End: 2023-07-17
  Administered 2023-07-15: 4 mg via INTRAVENOUS
  Filled 2023-07-10: qty 2

## 2023-07-10 MED ORDER — ORAL CARE MOUTH RINSE: 15.0000 mL | OROMUCOSAL | Status: DC | PRN

## 2023-07-10 MED ORDER — SODIUM CHLORIDE 0.9 % IV BOLUS
1000.0000 mL | Freq: Once | INTRAVENOUS | Status: AC
  Administered 2023-07-10: 1000 mL via INTRAVENOUS

## 2023-07-10 MED ORDER — APIXABAN 5 MG PO TABS
5.0000 mg | ORAL_TABLET | Freq: Two times a day (BID) | ORAL | Status: DC
  Administered 2023-07-10 – 2023-07-17 (×14): 5 mg via ORAL
  Filled 2023-07-10 (×14): qty 1

## 2023-07-10 MED ORDER — BUPROPION HCL ER (XL) 150 MG PO TB24: 150.0000 mg | ORAL_TABLET | Freq: Every morning | ORAL | Status: DC

## 2023-07-10 MED ORDER — DOCUSATE SODIUM 100 MG PO CAPS
100.0000 mg | ORAL_CAPSULE | Freq: Two times a day (BID) | ORAL | Status: DC | PRN
  Administered 2023-07-15 – 2023-07-16 (×2): 100 mg via ORAL
  Filled 2023-07-10 (×2): qty 1

## 2023-07-10 MED ORDER — ALPRAZOLAM 0.5 MG PO TABS
0.5000 mg | ORAL_TABLET | Freq: Three times a day (TID) | ORAL | Status: DC | PRN
  Administered 2023-07-10 – 2023-07-12 (×2): 0.5 mg via ORAL
  Filled 2023-07-10 (×2): qty 1

## 2023-07-10 MED ORDER — FAMOTIDINE 20 MG PO TABS
20.0000 mg | ORAL_TABLET | Freq: Two times a day (BID) | ORAL | Status: DC
  Administered 2023-07-11 – 2023-07-17 (×12): 20 mg via ORAL
  Filled 2023-07-10 (×13): qty 1

## 2023-07-10 MED ORDER — ALBUTEROL SULFATE (2.5 MG/3ML) 0.083% IN NEBU
2.5000 mg | INHALATION_SOLUTION | RESPIRATORY_TRACT | Status: DC
Start: 2023-07-10 — End: 2023-07-13
  Administered 2023-07-10 – 2023-07-13 (×16): 2.5 mg via RESPIRATORY_TRACT
  Filled 2023-07-10 (×18): qty 3

## 2023-07-10 MED ORDER — DOXEPIN HCL 25 MG PO CAPS
100.0000 mg | ORAL_CAPSULE | Freq: Every day | ORAL | Status: DC
  Administered 2023-07-10 – 2023-07-16 (×7): 100 mg via ORAL
  Filled 2023-07-10: qty 4
  Filled 2023-07-10: qty 1
  Filled 2023-07-10 (×5): qty 4
  Filled 2023-07-10: qty 1
  Filled 2023-07-10: qty 4

## 2023-07-10 MED ORDER — VITAMIN D 25 MCG (1000 UNIT) PO TABS
2000.0000 [IU] | ORAL_TABLET | Freq: Every day | ORAL | Status: DC
  Administered 2023-07-10 – 2023-07-17 (×8): 2000 [IU] via ORAL
  Filled 2023-07-10 (×8): qty 2

## 2023-07-10 MED ORDER — ALBUTEROL SULFATE (2.5 MG/3ML) 0.083% IN NEBU
2.5000 mg | INHALATION_SOLUTION | RESPIRATORY_TRACT | Status: DC | PRN
  Administered 2023-07-13 – 2023-07-14 (×3): 2.5 mg via RESPIRATORY_TRACT
  Filled 2023-07-10 (×2): qty 3

## 2023-07-10 MED ORDER — POLYETHYLENE GLYCOL 3350 17 G PO PACK
17.0000 g | PACK | Freq: Every day | ORAL | Status: DC | PRN
  Administered 2023-07-15 – 2023-07-16 (×2): 17 g via ORAL
  Filled 2023-07-10 (×2): qty 1

## 2023-07-10 MED ORDER — ENOXAPARIN SODIUM 30 MG/0.3ML IJ SOSY: 30.0000 mg | PREFILLED_SYRINGE | INTRAMUSCULAR | Status: DC

## 2023-07-10 MED ORDER — ORAL CARE MOUTH RINSE
15.0000 mL | OROMUCOSAL | Status: DC
  Administered 2023-07-10 – 2023-07-17 (×20): 15 mL via OROMUCOSAL

## 2023-07-10 MED ORDER — MONTELUKAST SODIUM 10 MG PO TABS
10.0000 mg | ORAL_TABLET | Freq: Every day | ORAL | Status: DC
  Administered 2023-07-10 – 2023-07-17 (×8): 10 mg via ORAL
  Filled 2023-07-10 (×8): qty 1

## 2023-07-10 MED ORDER — IOHEXOL 350 MG/ML SOLN
85.0000 mL | Freq: Once | INTRAVENOUS | Status: AC | PRN
Start: 2023-07-10 — End: 2023-07-10
  Administered 2023-07-10: 85 mL via INTRAVENOUS

## 2023-07-10 MED ORDER — PANTOPRAZOLE SODIUM 40 MG PO TBEC
40.0000 mg | DELAYED_RELEASE_TABLET | Freq: Two times a day (BID) | ORAL | Status: DC
  Administered 2023-07-10 – 2023-07-17 (×14): 40 mg via ORAL
  Filled 2023-07-10 (×14): qty 1

## 2023-07-10 MED ORDER — SODIUM CHLORIDE 0.9 % IV SOLN: 2.0000 g | INTRAVENOUS | Status: DC

## 2023-07-10 MED ORDER — SODIUM CHLORIDE 0.9 % IV SOLN
100.0000 mg | Freq: Two times a day (BID) | INTRAVENOUS | Status: DC
  Administered 2023-07-10 (×2): 100 mg via INTRAVENOUS
  Filled 2023-07-10 (×3): qty 100

## 2023-07-10 MED ORDER — VALACYCLOVIR HCL 500 MG PO TABS: 500.0000 mg | ORAL_TABLET | Freq: Every day | ORAL | Status: DC | PRN

## 2023-07-10 NOTE — Progress Notes (Signed)
 Orthopedic Tech Progress Note Patient Details:  [name] 1955-08-29 [id number]  LEVEL 2 TRAUMA  Patient ID: [name], female   DOB: Aug 22, 1955, 68 y.o.   MRN: [id number]  [name] 07/10/2023, 10:00 AM

## 2023-07-10 NOTE — Consult Note (Signed)
 Upgrade to L1A after CT completed but prior to results due to hypotension after fall on thinners. CTs resulted shortly thereafter and all negative. Defer to internal medicine service for further management.   Anda Bamberg, MD General and Trauma Surgery [facility]

## 2023-07-10 NOTE — Telephone Encounter (Signed)

## 2023-07-10 NOTE — ED Notes (Signed)
 Portable xrays in progress

## 2023-07-10 NOTE — Sepsis Progress Note (Signed)
 Code Sepsis protocol being monitored by [employer].

## 2023-07-10 NOTE — ED Provider Notes (Signed)
 [place] EMERGENCY DEPARTMENT AT [facility] Provider Note   CSN: [id number] Arrival date & time: 07/10/23  7829     Patient presents with: Trauma and Fall   [name] is a 68 y.o. female.   HPI   68 year old female with medical history significant for DVT/PE on Xarelto , hepatitis C with liver cirrhosis, COPD, polysubstance abuse and benzodiazepine dependence who presents to the emergency department as a level 2 trauma after being found down.  History is provided by EMS.  The patient had a questionable overdose on Xanax , took reportedly 1-3 tabs per family members according to EMS.  Had borderline low systolic blood pressure, had a witnessed fall fell against a dresser and hit her face and head, fell twice with some bleeding noted from her mouth with some concern for aspiration.  The patient arrives confused, GCS 14, complaining of pain in her shoulder on the right, low back which is chronic pain, chest pain which is also chronic pain.  She endorses mild nausea.  She denies any headache.  Prior to Admission medications   Medication Sig Start Date End Date Taking? Authorizing Provider  albuterol  (PROAIR  HFA) 108 (90 Base) MCG/ACT inhaler INHALE 2 PUFFS INTO THE LUNGS EVERY 4 HOURS AS NEEDED FOR WHEEZING Patient taking differently: Inhale 2 puffs into the lungs every 4 (four) hours as needed for shortness of breath or wheezing. 12/04/18   Lind Repine, MD  albuterol  (PROVENTIL ) (2.5 MG/3ML) 0.083% nebulizer solution Take 3 mLs (2.5 mg total) by nebulization every 4 (four) hours as needed for wheezing or shortness of breath. Patient taking differently: Take 2.5 mg by nebulization as needed for wheezing or shortness of breath. 12/04/18   Lind Repine, MD  ALPRAZolam  (XANAX ) 1 MG tablet Take 1 tablet (1 mg total) by mouth 3 (three) times daily. Patient taking differently: Take 1 mg by mouth 3 (three) times daily as needed for anxiety. 06/27/22   Shalhoub, Merrill Abide, MD   budesonide -formoterol  (SYMBICORT ) 160-4.5 MCG/ACT inhaler Inhale 2 puffs into the lungs 2 (two) times daily. Patient taking differently: Inhale 2 puffs into the lungs in the morning and at bedtime. 12/04/18 07/10/23  Lind Repine, MD  budesonide -formoterol  (SYMBICORT ) 80-4.5 MCG/ACT inhaler Inhale 2 puffs into the lungs in the morning and at bedtime. 05/05/23   Teddi Favors, DO  buPROPion  (WELLBUTRIN  XL) 150 MG 24 hr tablet Take 150 mg by mouth every morning.    [provider]  carvedilol  (COREG ) 12.5 MG tablet Take 1 tablet (12.5 mg total) by mouth 2 (two) times daily with a meal. 06/27/22   Shalhoub, Merrill Abide, MD  Cholecalciferol  (VITAMIN D3) 50 MCG (2000 UT) TABS Take 2,000 Units by mouth daily.    [provider]  clotrimazole -betamethasone  (LOTRISONE ) cream Apply to affected area 2 times daily prn Patient taking differently: Apply 1 application  topically as needed (rash). 04/29/20   Steinl, Kevin, MD  docusate sodium  (COLACE) 100 MG capsule Take 100 mg by mouth 2 (two) times daily.    [provider]  doxepin  (SINEQUAN ) 100 MG capsule Take 100 mg by mouth at bedtime. 06/01/22   [provider]  entecavir  (BARACLUDE ) 0.5 MG tablet Take 0.5 mg by mouth every other day. 11/25/19   [provider]  famotidine  (PEPCID ) 20 MG tablet Take 20 mg by mouth 2 (two) times daily. 10/28/19   [provider]  ferrous sulfate (FEROSUL) 325 (65 FE) MG tablet Take 325 mg by mouth daily.  [provider]  furosemide  (LASIX ) 20 MG tablet Take 20 mg by mouth daily. 07/06/22   [provider]  gabapentin  (NEURONTIN ) 300 MG capsule Take 300 mg by mouth 3 (three) times daily. 07/13/20   [provider]  levocetirizine (XYZAL) 5 MG tablet Take 5 mg by mouth every evening. 12/02/19   [provider]  montelukast  (SINGULAIR ) 10 MG tablet Take 10 mg by mouth daily.    [provider]  NYSTATIN powder Apply 1 Application  topically as needed (yeast). 07/05/21   [provider]  ondansetron  (ZOFRAN -ODT) 8 MG disintegrating tablet Take 8 mg by mouth every 8 (eight) hours as needed for nausea or vomiting.    [provider]  pantoprazole  (PROTONIX ) 40 MG tablet Take 40 mg by mouth 2 (two) times daily before a meal. 12/23/13   [provider]  spironolactone  (ALDACTONE ) 50 MG tablet Take 50 mg by mouth daily. 06/27/23 12/24/23  [provider]  sucralfate  (CARAFATE ) 1 g tablet Take 1 g by mouth 4 (four) times daily. 07/22/19   [provider]  valACYclovir (VALTREX) 500 MG tablet Take 500 mg by mouth daily as needed (breakout). 08/21/17   [provider]  XARELTO  20 MG TABS tablet Take 20 mg by mouth daily.    [provider]  enoxaparin  (LOVENOX ) 120 MG/0.8ML SOLN Inject 0.8 mLs (120 mg total) into the skin every 12 (twelve) hours. 11/30/10 04/16/11  Antionette Bath, MD  loratadine  (CLARITIN ) 10 MG tablet Take 10 mg by mouth daily.    04/16/11  [provider]    Allergies: Heparin , Amitriptyline  hcl, Azithromycin , Fentanyl , Cymbalta [duloxetine hcl], and Neomycin-bacitracin zn-polymyx    Review of Systems  Unable to perform ROS: Mental status change    Updated Vital Signs BP 122/66   Pulse 83   Temp 99.4 F (37.4 C) (Oral)   Resp (!) 23   Ht 5' 4 (1.626 m)   Wt 103.9 kg   SpO2 98%   BMI 39.31 kg/m   Physical Exam Vitals and nursing note reviewed.  Constitutional:      General: She is not in acute distress.    Appearance: She is well-developed. She is obese.     Comments: GCS 14, ABC intact  HENT:     Head: Normocephalic and atraumatic.   Eyes:     Extraocular Movements: Extraocular movements intact.     Conjunctiva/sclera: Conjunctivae normal.     Pupils: Pupils are equal, round, and reactive to light.   Neck:     Comments: C-collar in place. No midline tenderness to palpation of the cervical spine.   Cardiovascular:      Rate and Rhythm: Regular rhythm. Tachycardia present.  Pulmonary:     Effort: Pulmonary effort is normal. No respiratory distress.     Breath sounds: Rhonchi present.  Chest:     Comments: Clavicles stable nontender to AP compression.  Chest wall stable and nontender to AP and lateral compression. Abdominal:     General: There is distension.     Palpations: Abdomen is soft.     Tenderness: There is no abdominal tenderness.     Comments: Pelvis stable to lateral compression   Musculoskeletal:     Cervical back: Neck supple.     Comments: No midline tenderness to palpation of the thoracic or lumbar spine.  Extremities atraumatic with intact range of motion   Skin:    General: Skin is warm and dry.   Neurological:  Mental Status: She is alert.     Comments: Cranial nerves II through XII grossly intact.  Moving all 4 extremities spontaneously.  Sensation grossly intact all 4 extremities.  Asterixis present bilaterally    (all labs ordered are listed, but only abnormal results are displayed) Labs Reviewed  COMPREHENSIVE METABOLIC PANEL WITH GFR - Abnormal; Notable for the following components:      Result Value   Sodium 134 (*)    CO2 21 (*)    Creatinine, Ser 2.14 (*)    Total Protein 6.2 (*)    Albumin 2.9 (*)    GFR, Estimated 25 (*)    All other components within normal limits  CBC - Abnormal; Notable for the following components:   WBC 11.5 (*)    Hemoglobin 10.4 (*)    HCT 34.6 (*)    MCH 25.7 (*)    All other components within normal limits  URINALYSIS, ROUTINE W REFLEX MICROSCOPIC - Abnormal; Notable for the following components:   Hgb urine dipstick SMALL (*)    All other components within normal limits  PROTIME-INR - Abnormal; Notable for the following components:   Prothrombin Time 25.5 (*)    INR 2.3 (*)    All other components within normal limits  RAPID URINE DRUG SCREEN, HOSP PERFORMED - Abnormal; Notable for the following components:    Benzodiazepines POSITIVE (*)    All other components within normal limits  GLUCOSE, CAPILLARY - Abnormal; Notable for the following components:   Glucose-Capillary 107 (*)    All other components within normal limits  I-STAT CHEM 8, ED - Abnormal; Notable for the following components:   Sodium 132 (*)    Creatinine, Ser 2.20 (*)    Hemoglobin 11.6 (*)    HCT 34.0 (*)    All other components within normal limits  I-STAT CG4 LACTIC ACID, ED - Abnormal; Notable for the following components:   Lactic Acid, Venous 3.9 (*)    All other components within normal limits  I-STAT VENOUS BLOOD GAS, ED - Abnormal; Notable for the following components:   pO2, Ven 48 (*)    Sodium 133 (*)    HCT 32.0 (*)    Hemoglobin 10.9 (*)    All other components within normal limits  I-STAT CG4 LACTIC ACID, ED - Abnormal; Notable for the following components:   Lactic Acid, Venous 3.6 (*)    All other components within normal limits  I-STAT CG4 LACTIC ACID, ED - Abnormal; Notable for the following components:   Lactic Acid, Venous 3.3 (*)    All other components within normal limits  TROPONIN I (HIGH SENSITIVITY) - Abnormal; Notable for the following components:   Troponin I (High Sensitivity) 30 (*)    All other components within normal limits  TROPONIN I (HIGH SENSITIVITY) - Abnormal; Notable for the following components:   Troponin I (High Sensitivity) 28 (*)    All other components within normal limits  RESPIRATORY PANEL BY PCR  SARS CORONAVIRUS 2 BY RT PCR  MRSA NEXT GEN BY PCR, NASAL  CULTURE, BLOOD (ROUTINE X 2)  CULTURE, BLOOD (ROUTINE X 2)  CULTURE, BLOOD (SINGLE)  EXPECTORATED SPUTUM ASSESSMENT W GRAM STAIN, RFLX TO RESP C  ETHANOL  AMMONIA  HEMOGLOBIN A1C  GLUCOSE, CAPILLARY  LEGIONELLA PNEUMOPHILA SEROGP 1 UR AG  STREP PNEUMONIAE URINARY ANTIGEN  DRUG SCREEN, UR (12+OXYCODONE +CRT)  CBC  MAGNESIUM   PHOSPHORUS  COMPREHENSIVE METABOLIC PANEL WITH GFR  SAMPLE TO BLOOD BANK  EKG: None  Radiology: CT CHEST ABDOMEN PELVIS W CONTRAST Result Date: 07/10/2023 CLINICAL DATA:  Un witnessed fall.  On Xarelto .  Hypotensive EXAM: CT CHEST, ABDOMEN, AND PELVIS WITH CONTRAST TECHNIQUE: Multidetector CT imaging of the chest, abdomen and pelvis was performed following the standard protocol during bolus administration of intravenous contrast. RADIATION DOSE REDUCTION: This exam was performed according to the departmental dose-optimization program which includes automated exposure control, adjustment of the mA and/or kV according to patient size and/or use of iterative reconstruction technique. CONTRAST:  85mL OMNIPAQUE  IOHEXOL  350 MG/ML SOLN COMPARISON:  CTA chest 05/05/2023 abdomen pelvis CT 08/24/2022. FINDINGS: CT CHEST FINDINGS Cardiovascular: Heart is nonenlarged. Trace pericardial fluid. The thoracic aorta is normal course and caliber with mild atherosclerotic plaque. There is pulsation artifact along the ascending aorta. No mediastinal hematoma. There is enlargement of the main and left pulmonary artery compared to right. Please correlate for any known history including pulmonary hypertension and potential pulmonary valve abnormality. Appearance is similar to previous. Mediastinum/Nodes: Preserved visualized portions of the thyroid gland. Slightly patulous thoracic esophagus. No abnormal lymph node enlargement identified in the axillary regions, hilum or mediastinum. Lungs/pleura: There is multifocal ill-defined parenchymal consolidative and ground-glass opacities. Most extensive in the left lower lobe but also significant areas in the right lower lobe, right upper lobe. Minimal in middle lobe and lingula. Please correlate for multifocal infiltrate. Please correlate for any evidence of aspiration or other process. Breathing motion. No pneumothorax or effusion. Musculoskeletal: Degenerative changes seen throughout the thoracic spine. Bridging endplate osteophytes along the lower  thoracic spine. Multilevel Schmorl's node deformity with disc height loss. Degenerative changes also seen of the right shoulder. CT ABDOMEN PELVIS FINDINGS Hepatobiliary: Fatty nodular heterogeneous liver consistent with known chronic liver disease. No space-occupying liver lesion. Patent portal vein. Gallbladder is nondilated. Pancreas: Unremarkable. No pancreatic ductal dilatation or surrounding inflammatory changes. Spleen: Spleen is enlarged with a cephalocaudal length of 14.9 cm. Adrenals/Urinary Tract: Adrenal glands are preserved. Moderate bilateral renal atrophy with punctate nonobstructing stones bilaterally. Multiple cysts are again identified. Largest on the right previously measured 5.4 cm and today 5.7 cm. Hounsfield unit of 13. Bosniak 1 lesion. Other lesions are also relatively simple except for 1 lesion which is seen posterior along the left mid kidney measuring 18 mm. Hounsfield unit of 30 on portal venous phase and 31 on delay there is also larger from previous. Recommend dedicated workup when appropriate such as MRI. No collecting system dilatation. The ureters have a normal course and caliber down to the bladder. Preserved contour to the urinary bladder. Stomach/Bowel: On this non oral contrast exam large bowel has a normal course and caliber. Scattered colonic stool. Normal appendix. Moderate debris in the stomach. Small bowel has a normal course and caliber. Few fluid-filled loops of bowel in the proximal small bowel, nonspecific. Vascular/Lymphatic: Normal caliber aorta and IVC. Scattered atherosclerotic changes. There is a focal stenosis seen of the celiac axis origin best seen on sagittal image 98. No specific abnormal lymph node enlargement identified in the abdomen and pelvis. Reproductive: Status post hysterectomy. No adnexal masses. Other: No free intra-air or free fluid. There is some skin thickening along the anterior pelvic wall with underlying fat stranding. This is increased from  previous. Musculoskeletal: Curvature of the lumbar spine with degenerative changes of the spine and pelvis. Critical Value/emergent results were called by telephone at the time of interpretation on 07/10/2023 at 11:06 am to provider [facility] , who verbally acknowledged these results. IMPRESSION: Multifocal parenchymal  ill-defined opacities to the lungs, greatest in the left lung base greater than right but also other areas as well. Please correlate for multifocal infiltrate or signs of aspiration in the setting of trauma. No pleural effusion or pneumothorax. Persistent asymmetric enlargement of the left pulmonary artery. Please correlate for any known history including along pulmonic valve abnormality. Evidence of chronic liver disease with a nodular fatty liver. Splenomegaly. No significant ascites. Patent portal vein. No bowel obstruction, free air or free fluid. No evidence of solid organ injury. Bilateral renal atrophy with nonobstructing stones. Multiple cystic foci identified including 1 lesion along the left kidney posteriorly which is slightly larger and more ill-defined from previous. Recommend a dedicated MRI workup when clinically appropriate with and without contrast. Skin thickening along the anterior pelvic wall with underlying fat stranding. Please correlate for 8 size of infection. No soft tissue gas. Electronically Signed   By: Adrianna Horde M.D.   On: 07/10/2023 11:08   CT Maxillofacial Wo Contrast Result Date: 07/10/2023 CLINICAL DATA:  level 2 trauma, fall, xarelto  EXAM: CT MAXILLOFACIAL WITHOUT CONTRAST TECHNIQUE: Multidetector CT imaging of the maxillofacial structures was performed. Multiplanar CT image reconstructions were also generated. RADIATION DOSE REDUCTION: This exam was performed according to the departmental dose-optimization program which includes automated exposure control, adjustment of the mA and/or kV according to patient size and/or use of iterative reconstruction  technique. COMPARISON:  CT of the head dated January 13, 2023. FINDINGS: Osseous: No fracture or mandibular dislocation. No destructive process. There is moderate temporomandibular arthrosis present bilaterally. Orbits: Negative. No traumatic or inflammatory finding. Sinuses: Clear. Soft tissues: No hematoma or significant soft tissue swelling. Limited intracranial: Unremarkable. IMPRESSION: Moderate bilateral temporomandibular arthrosis. No evidence of acute traumatic injury. This These results were called by telephone at the time of interpretation on 07/10/2023 at 10:45 am to provider [facility] , who verbally acknowledged these results. Electronically Signed   By: Maribeth Shivers M.D.   On: 07/10/2023 10:47   CT Cervical Spine Wo Contrast Result Date: 07/10/2023 CLINICAL DATA:  level 2 trauma, fall, xarelto  EXAM: CT CERVICAL SPINE WITHOUT CONTRAST TECHNIQUE: Multidetector CT imaging of the cervical spine was performed without intravenous contrast. Multiplanar CT image reconstructions were also generated. RADIATION DOSE REDUCTION: This exam was performed according to the departmental dose-optimization program which includes automated exposure control, adjustment of the mA and/or kV according to patient size and/or use of iterative reconstruction technique. COMPARISON:  None Available. FINDINGS: Alignment: Anatomic. Skull base and vertebrae: Patient is status post left laminectomies at C4, C5, C6 and C7. There is no evidence of acute traumatic injury. Soft tissues and spinal canal: No evidence of hematoma or soft tissue injury. Disc levels: Slight degenerative anterolisthesis at C3-4. Mild disc space narrowing and endplate ridging at C5-6 and C6-7. No significant central spinal canal stenosis. Upper chest: Clear. Other: None. IMPRESSION: 1. Status post left-sided decompression laminectomies. No evidence of acute traumatic injury. Electronically Signed   By: Maribeth Shivers M.D.   On: 07/10/2023 10:41    CT Head Wo Contrast Result Date: 07/10/2023 CLINICAL DATA:  level 2 trauma, fall, xarelto  EXAM: CT HEAD WITHOUT CONTRAST TECHNIQUE: Contiguous axial images were obtained from the base of the skull through the vertex without intravenous contrast. RADIATION DOSE REDUCTION: This exam was performed according to the departmental dose-optimization program which includes automated exposure control, adjustment of the mA and/or kV according to patient size and/or use of iterative reconstruction technique. COMPARISON:  CT of the head dated January 13, 2023. FINDINGS: Brain: Normal. No evidence of hemorrhage, mass, cortical infarct or hydrocephalus. Vascular: Negative. Skull: Intact and unremarkable. Sinuses/Orbits: Minimal mucosal disease within the maxillary sinuses. The orbits are unremarkable. Other: None. IMPRESSION: Normal brain.  No evidence of acute traumatic injury. Electronically Signed   By: Maribeth Shivers M.D.   On: 07/10/2023 10:33   DG Pelvis Portable Result Date: 07/10/2023 CLINICAL DATA:  Fall. EXAM: PORTABLE PELVIS 1-2 VIEWS COMPARISON:  None Available. FINDINGS: Exam limited by positioning and osteopenia. No gross fracture of the bony pelvis evident. SI joints and symphysis pubis unremarkable. Limited assessment of the hips although there is some lucency overlying the left femoral head and greater trochanter. IMPRESSION: 1. Exam limited by positioning and osteopenia. 2. Lucency overlying the left femoral head and greater trochanter. Fracture not excluded. If there is clinical concern for left hip injury, CT imaging recommended to further evaluate. Electronically Signed   By: Donnal Fusi M.D.   On: 07/10/2023 10:26   DG Chest Port 1 View Result Date: 07/10/2023 CLINICAL DATA:  Status post fall. EXAM: PORTABLE CHEST 1 VIEW COMPARISON:  05/11/2023 FINDINGS: Rightward patient rotation distorts cardiomediastinal anatomy. The cardio pericardial silhouette is enlarged. There is pulmonary vascular  congestion with diffuse interstitial opacity raising concern for edema. No pneumothorax or substantial pleural effusion. No acute bony abnormality. Telemetry leads overlie the chest. IMPRESSION: Enlargement of the cardiopericardial silhouette with pulmonary vascular congestion and diffuse interstitial opacity raising concern for edema. Electronically Signed   By: Donnal Fusi M.D.   On: 07/10/2023 10:24     .Critical Care  Performed by: Rosealee Concha, MD Authorized by: Rosealee Concha, MD   Critical care provider statement:    Critical care time (minutes):  124   Critical care was necessary to treat or prevent imminent or life-threatening deterioration of the following conditions:  Trauma, circulatory failure and hepatic failure   Critical care was time spent personally by me on the following activities:  Development of treatment plan with patient or surrogate, discussions with consultants, evaluation of patient's response to treatment, examination of patient, ordering and review of laboratory studies, ordering and review of radiographic studies, ordering and performing treatments and interventions, pulse oximetry, re-evaluation of patient's condition and review of old charts   Care discussed with: admitting provider      Medications Ordered in the ED  lactated ringers  infusion ( Intravenous Infusion Verify 07/10/23 1900)  docusate sodium  (COLACE) capsule 100 mg (has no administration in time range)  polyethylene glycol (MIRALAX  / GLYCOLAX ) packet 17 g (has no administration in time range)  ondansetron  (ZOFRAN ) injection 4 mg (has no administration in time range)  0.9 %  sodium chloride  infusion ( Intravenous Stopped 07/10/23 1538)  albuterol  (PROVENTIL ) (2.5 MG/3ML) 0.083% nebulizer solution 2.5 mg (2.5 mg Nebulization Given 07/10/23 1517)  albuterol  (PROVENTIL ) (2.5 MG/3ML) 0.083% nebulizer solution 2.5 mg (has no administration in time range)  insulin  aspart (novoLOG ) injection 0-9 Units (  Subcutaneous Not Given 07/10/23 1541)  doxycycline  (VIBRAMYCIN ) 100 mg in sodium chloride  0.9 % 250 mL IVPB (0 mg Intravenous Stopped 07/10/23 1854)  oxyCODONE  (Oxy IR/ROXICODONE ) immediate release tablet 5 mg (5 mg Oral Given 07/10/23 1446)  piperacillin -tazobactam (ZOSYN ) IVPB 3.375 g ( Intravenous Infusion Verify 07/10/23 1900)  apixaban (ELIQUIS) tablet 5 mg (has no administration in time range)  0.9 %  sodium chloride  infusion ( Intravenous Infusion Verify 07/10/23 1900)  norepinephrine  (LEVOPHED ) 4mg  in (0.016 mg/mL) premix infusion (8 mcg/min Intravenous Infusion Verify 07/10/23 1900)  fluticasone  furoate-vilanterol (BREO ELLIPTA) 100-25 MCG/ACT 1 puff (1 puff Inhalation Not Given 07/10/23 1457)  ALPRAZolam  (XANAX ) tablet 0.5 mg (has no administration in time range)  buPROPion  (WELLBUTRIN  XL) 24 hr tablet 150 mg (has no administration in time range)  cholecalciferol  (VITAMIN D3) 25 MCG (1000 UNIT) tablet 2,000 Units (2,000 Units Oral Given 07/10/23 1447)  doxepin  (SINEQUAN ) capsule 100 mg (has no administration in time range)  entecavir  (BARACLUDE ) tablet 0.5 mg (0.5 mg Oral Not Given 07/10/23 1530)  famotidine  (PEPCID ) tablet 20 mg (20 mg Oral Not Given 07/10/23 1530)  ferrous sulfate tablet 325 mg (325 mg Oral Given 07/10/23 1534)  gabapentin  (NEURONTIN ) capsule 100 mg (100 mg Oral Given 07/10/23 1534)  montelukast  (SINGULAIR ) tablet 10 mg (10 mg Oral Given 07/10/23 1446)  pantoprazole  (PROTONIX ) EC tablet 40 mg (40 mg Oral Given 07/10/23 1655)  valACYclovir (VALTREX) tablet 500 mg (has no administration in time range)  Chlorhexidine  Gluconate Cloth 2 % PADS 6 each (6 each Topical Given 07/10/23 1330)  Oral care mouth rinse (has no administration in time range)  Oral care mouth rinse (has no administration in time range)  sodium chloride  0.9 % bolus 1,000 mL (0 mLs Intravenous Stopped 07/10/23 1021)  iohexol  (OMNIPAQUE ) 350 MG/ML injection 85 mL (85 mLs Intravenous Contrast Given 07/10/23 1006)   Ampicillin -Sulbactam (UNASYN ) 3 g in sodium chloride  0.9 % 100 mL IVPB (0 g Intravenous Stopped 07/10/23 1123)  sodium chloride  0.9 % bolus 1,000 mL (0 mLs Intravenous Stopped 07/10/23 1043)    Clinical Course as of 07/10/23 1932  Tue Jul 10, 2023  0954 Lactic Acid, Venous(!!): 3.9 [JL]  0954 Creatinine(!): 2.20 [JL]  0954 Hemoglobin(!): 10.4 [JL]    Clinical Course User Index [JL] Rosealee Concha, MD                                 Medical Decision Making Amount and/or Complexity of Data Reviewed Labs: ordered. Decision-making details documented in ED Course. Radiology: ordered.  Risk Prescription drug management. Decision regarding hospitalization.   68 year old female with medical history significant for DVT/PE on Xarelto , hepatitis C with liver cirrhosis, COPD, polysubstance abuse and benzodiazepine dependence who presents to the emergency department as a level 2 trauma after being found down.  History is provided by EMS.  The patient had a questionable overdose on Xanax , took reportedly 1-3 tabs per family members according to EMS.  Had borderline low systolic blood pressure, had a witnessed fall fell against a dresser and hit her face and head, fell twice with some bleeding noted from her mouth with some concern for aspiration.  The patient arrives confused, GCS 14, complaining of pain in her shoulder on the right, low back which is chronic pain, chest pain which is also chronic pain.  She endorses mild nausea.  She denies any headache.  On arrival, the patient was tachycardic heart rate 108, not tachypneic RR 15, BP 124/68, saturating 90% on room air.  Physical exam revealed asterixis bilaterally, GCS 14, no focal neurologic deficits, no Particular tenderness, increasing hypoxia subsequently placed on 6 L O2 via nasal cannula.  Patient with rhonchi on exam.  Workup initiated to include chest x-ray and pelvis x-ray.  Given patient history of cirrhosis, fast was deferred, initially  attempted but indeterminate due to body habitus.  Trauma imaging revealed (full reports in EMR): Portable CXR:  No evidence of pneumothorax or tracheal deviation Portable Pelvis:  No evidence  of acute hip fracture or malalignment FAST:  Indeterminate due to body habitus  Patient recent creatinine has been stable, the decision was made to defer labs and transport the patient to CT given her acuity.  CT imaging was obtained to include CT C-spine, head, max face, chest abdomen pelvis.  Prior to full CT imaging results, the patient became hypotensive and was upgraded to a level 1 trauma. CT scans (pan-scan):  No acute traumatic attic injury noted, trauma surgery signed off on the patient.  CT chest abdomen pelvis concerning for aspiration pneumonia.  Patient administered 2 L of IV fluids for volume resuscitation in the setting of suspected sepsis and aspiration pneumonia.  Patient confusion likely multifactorial in the setting of hepatic encephalopathy with asterixis noted on exam and toxic metabolic in the setting of sepsis.  Additionally considered polypharmacy and medication overdose given the patient's benzodiazepine dependence.  Code sepsis was initiated.  Blood cultures were obtained and the patient was administered IV Unasyn .  IMPRESSION:  Multifocal parenchymal ill-defined opacities to the lungs, greatest  in the left lung base greater than right but also other areas as  well. Please correlate for multifocal infiltrate or signs of  aspiration in the setting of trauma. No pleural effusion or  pneumothorax.    Persistent asymmetric enlargement of the left pulmonary artery.  Please correlate for any known history including along pulmonic  valve abnormality.    Evidence of chronic liver disease with a nodular fatty liver.  Splenomegaly. No significant ascites. Patent portal vein.    No bowel obstruction, free air or free fluid. No evidence of solid  organ injury.    Bilateral renal  atrophy with nonobstructing stones. Multiple cystic  foci identified including 1 lesion along the left kidney posteriorly  which is slightly larger and more ill-defined from previous.  Recommend a dedicated MRI workup when clinically appropriate with  and without contrast.    Skin thickening along the anterior pelvic wall with underlying fat  stranding. Please correlate for 8 size of infection. No soft tissue  gas.     Labs significant for an AKI with a creatinine of 2.2, lactic acidosis to 3.9, repeat downtrending to 3.3.  CBC with leukocytosis to 11.5.  Concern for developing septic shock in the setting of aspiration pneumonia.  Patient remained hypotensive despite Levophed  on multiple blood pressure cuff readings and was started on norepinephrine  for blood pressure support.  Patient C-spine cleared in the emergency department following negative CT imaging.  Pulmonary critical care was consulted for admission, Dr. Fulton Job admitting.  The patient was subsequently admitted in critical but stabilizing condition on Levophed .      Final diagnoses:  Hepatic encephalopathy (HCC)  Medication overdose, undetermined intent, initial encounter  Acute respiratory failure with hypoxia (HCC)  Septic shock (HCC)  Cirrhosis of liver without ascites, unspecified hepatic cirrhosis type (HCC)  Aspiration pneumonia, unspecified aspiration pneumonia type, unspecified laterality, unspecified part of lung [facility])    ED Discharge Orders     None          Rosealee Concha, MD 07/10/23 1932

## 2023-07-10 NOTE — Progress Notes (Signed)
 PHARMACY - ANTICOAGULATION CONSULT NOTE  Pharmacy Consult for Eliquis Indication: history of PE and DVT  Allergies  Allergen Reactions   Heparin  Hives    Pt states she cannot take because she will break out in hives   Amitriptyline  Hcl Other (See Comments)    Changes in mental status    Azithromycin  Rash   Fentanyl  Other (See Comments)    Change in mental status - tolerates low dose   Cymbalta [Duloxetine Hcl] Other (See Comments)    Headaches    Neomycin-Bacitracin Zn-Polymyx Rash    Patient Measurements: Height: 5' 4 (162.6 cm) Weight: 103.9 kg (229 lb) IBW/kg (Calculated) : 54.7 HEPARIN  DW (KG): 79  Vital Signs: BP: 96/69 (06/17 1200) Pulse Rate: 94 (06/17 1200)  Labs: Recent Labs    07/10/23 0929 07/10/23 0935 07/10/23 1013  HGB 10.4* 11.6* 10.9*  HCT 34.6* 34.0* 32.0*  PLT 170  --   --   LABPROT 25.5*  --   --   INR 2.3*  --   --   CREATININE 2.14* 2.20*  --     Estimated Creatinine Clearance: 29.5 mL/min (A) (by C-G formula based on SCr of 2.2 mg/dL (H)).   Medical History: Past Medical History:  Diagnosis Date   Agoraphobia with panic attacks    Altered mental state 10/26/2013   Ankle fracture, left 11/27/2010   S/p ORIF 11/2    Anxiety    Arthritis    knees; lower back (10/30/2013)   Bipolar disorder (HCC)    Cavitary pneumonia    Cirrhosis (HCC)    CKD (chronic kidney disease), stage III (HCC)    COPD (chronic obstructive pulmonary disease) (HCC)    Depression    DVT (deep venous thrombosis) (HCC) 09/2013   RLE   GERD (gastroesophageal reflux disease)    Heart murmur    Hepatitis B    History of blood transfusion    due to excessive blood loss before hysterectomy   History of urinary tract infection    Hypertension    currently on no medication    Kidney stones    Pneumonia    4 times in the past year (10/30/2013)   Pulmonary emboli (HCC) 09/2013   Reactive airway disease 08/24/2022   Shortness of breath dyspnea    increased  exertion;exercise   Urinary frequency    Urinary incontinence     Medications:  Scheduled:   albuterol   2.5 mg Nebulization Q4H   enoxaparin  (LOVENOX ) injection  30 mg Subcutaneous Q24H   insulin  aspart  0-9 Units Subcutaneous Q4H   Infusions:   sodium chloride      cefTRIAXone  (ROCEPHIN )  IV     doxycycline  (VIBRAMYCIN ) IV     lactated ringers  150 mL/hr at 07/10/23 1208   norepinephrine  (LEVOPHED ) Adult infusion 5 mcg/min (07/10/23 1058)    Assessment: 68 yo female presenting with hypotension s/p fall on thinners. CT head with no evidence of acute traumatic injury. Patient is on xarelto  PTA for a history of DVT and PE. Unable to determine last dose of xarelto  PTA. Hgb 10.9, plt 170. Pharmacy has been consulted to switch to eliquis for history of DVT and PE.   Goal of Therapy:  Monitor platelets by anticoagulation protocol: Yes   Plan:  Start Eliquis 5 mg BID tonight at 2200 since unable to determine last xarelto  dose Monitor CBC and for s/sx of bleeding  Thank you for involving pharmacy in the patient's care.   Barbra Boone, PharmD PGY1 Acute  Care Pharmacy Resident  07/10/2023 1:41 PM

## 2023-07-10 NOTE — ED Notes (Signed)
 Back from CT, EDP at [facility], pt alert, NAD, calm, interactive, stable, no changes, talkative.

## 2023-07-10 NOTE — ED Notes (Signed)
 D/w EDP BP and IVF bolus, placed on pressure bag

## 2023-07-10 NOTE — H&P (Signed)
 NAME:  ODELIA GRACIANO, MRN:  [id number], DOB:  [birth year], LOS: 0 ADMISSION DATE:  07/10/2023, CONSULTATION DATE:  6/17 REFERRING MD:  Aleene Amor, CHIEF COMPLAINT:  septic shock   History of Present Illness:  Ms. Rouillard is a 68 y/o woman with a history of PE on Xarelto  who presented with fall at home hitting her head, on xarelto . She had taken 2-3 Xanax  this morning. With EMS she had concern for aspiration and dehydration. In the ED she was hypotensive, given IVF with ongoing MAPs in the 40s requiring vasopressors to be started peripherally. She says yes to all questions-- asking about fevers, cough, vomiting, diarrhea. No family at bedside to corroborate history. Blood cultures were collected and she was started on unasyn  for possible aspiration pneumonia. PCCM consulted for septic shock.  Pertinent  Medical History  Cirrhosis PE, DVT on xarelto  Chronic benzo use HTN HBV GERD Bipolar disorder  Significant Hospital Events: Including procedures, antibiotic start and stop dates in addition to other pertinent events   6/17 admitted, started on antibiotics  Interim History / Subjective:    Objective    Blood pressure 98/64, pulse 92, resp. rate 15, height 5' 4 (1.626 m), weight 103.9 kg, SpO2 94%.    FiO2 (%):  [100 %] 100 %   Intake/Output Summary (Last 24 hours) at 07/10/2023 1141 Last data filed at 07/10/2023 1123 Gross per 24 hour  Intake 2100 ml  Output --  Net 2100 ml   Filed Weights   07/10/23 0933  Weight: 103.9 kg    Examination: General: ill appearing woman lying in bed in NAD HENT: [place]/AT, eyes anicteric Lungs: breathing comfortably on [place], distant lung sounds Cardiovascular: S1S2, RRR Abdomen: obese, distneded Extremities: +LE edema, no UE edema Neuro: arouses to verbal stimulation, slow verbal responses. Asking for pain medication, but falls back asleep Derm: no diffuse rashes, no major bruises  Ethanol undetectable Ammonia 13 Na+ 134 BUN 19 Cr 2.14 WBC  11.5 H/H 10.4/34.6 Platelets 170  CT head personally reviewed> no bleeding, no acute findings  CT chest/abd/pelvis personally reviewed> multilobar pneumonia with nodular opacities  Resolved problem list   Assessment and Plan   Fall on xarelto ; no major injuries found on trauma workup -appreciate trauma evaluation; taken out of C-collar -PT, OT ordered  Septic shock-- associated encephalopathy and AKI Multilobar pneumonia; has history of Pseudomonas and cavitary pneumonias, so some of this could be scar Lactic acidosis due to sepsis -check RVP, covid -sputum culture -doxy & zosyn  given h/o pseudomonas pneumonia -need to check for ascites to ensure SBP is not likely -NE to maintain MAP >65 -ancipitate lactate will be slower to clear with liver failure  Acute respiratory failure with hypoxia due to pneumonia -pulmonary hygiene -supplemental O2 as required to maintain SpO2 >90%  Chronic benzodiazepine use -hold xanax  until more awake, then restart at reduced dose to prevent withdrawal -need to get med rec completed  Acute encephalopathy; ammonia WNL. Worry about misuse of xanax . Other intoxication is also possible; ETOH undetectable.  H/o bipolar disorder -check UDS; send out 12-drug screen also ordered  Cirrhosis, chronic HBV. 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 Can address when encephalopathy improves. Previous admissions has been full code.   Best Practice (right click and Reselect all SmartList Selections daily)   Diet/type: NPO w/ oral meds DVT prophylaxis DOAC Pressure ulcer(s): per nursing assessment GI prophylaxis: N/A Lines: N/A Foley:  N/A Code Status:  full code Last date of multidisciplinary goals of care discussion [  ]  Labs   CBC: Recent Labs  Lab 07/10/23 0929 07/10/23 0935 07/10/23 1013  WBC 11.5*  --   --   HGB 10.4* 11.6* 10.9*  HCT 34.6* 34.0* 32.0*  MCV 85.4  --   --   PLT 170  --   --     Basic Metabolic Panel: Recent Labs  Lab 07/10/23 0929 07/10/23 0935 07/10/23 1013  NA 134* 132* 133*  K 4.5 4.5 4.5  CL 100 98  --   CO2 21*  --   --   GLUCOSE 92 90  --   BUN 19 22  --   CREATININE 2.14* 2.20*  --   CALCIUM 9.4  --   --    GFR: Estimated Creatinine Clearance: 29.5 mL/min (A) (by C-G formula based on SCr of 2.2 mg/dL (H)). Recent Labs  Lab 07/10/23 0929 07/10/23 0936 07/10/23 1058  WBC 11.5*  --   --   LATICACIDVEN  --  3.9* 3.6*    Liver Function Tests: Recent Labs  Lab 07/10/23 0929  AST 23  ALT 14  ALKPHOS 79  BILITOT 0.7  PROT 6.2*  ALBUMIN 2.9*   No results for input(s): LIPASE, AMYLASE in the last 168 hours. Recent Labs  Lab 07/10/23 1007  AMMONIA <13    ABG    Component Value Date/Time   PHART 7.279 (L) 10/27/2013 0341   PCO2ART 37.2 10/27/2013 0341   PO2ART 86.9 10/27/2013 0341   HCO3 24.7 07/10/2023 1013   TCO2 26 07/10/2023 1013   ACIDBASEDEF 2.0 07/10/2023 1013   O2SAT 80 07/10/2023 1013     Coagulation Profile: Recent Labs  Lab 07/10/23 0929  INR 2.3*    Cardiac Enzymes: No  results for input(s): CKTOTAL, CKMB, CKMBINDEX, TROPONINI in the last 168 hours.  HbA1C: Hgb A1c MFr Bld  Date/Time Value Ref Range Status  06/17/2016 09:34 AM 5.9 (H) 4.8 - 5.6 % Final    Comment:    (NOTE)         Pre-diabetes: 5.7 - 6.4         Diabetes: >6.4         Glycemic control for adults with diabetes: <7.0   12/09/2013 04:41 AM 5.0 <5.7 % Final    Comment:    (NOTE)  According to the ADA Clinical Practice Recommendations for 2011, when HbA1c is used as a screening test:  >=6.5%   Diagnostic of Diabetes Mellitus           (if abnormal result is confirmed) 5.7-6.4%   Increased risk of developing Diabetes Mellitus References:Diagnosis and Classification of Diabetes Mellitus,Diabetes Care,2011,34(Suppl 1):S62-S69 and Standards of Medical Care in         Diabetes - 2011,Diabetes Care,2011,34 (Suppl 1):S11-S61.     CBG: No results for input(s): GLUCAP in the last 168 hours.  Review of Systems:   Limited due to encephalopathy  Past Medical History:  She,  has a past medical history of Agoraphobia with panic attacks, Altered mental state (10/26/2013), Ankle fracture, left (11/27/2010), Anxiety, Arthritis, Bipolar disorder (HCC), Cavitary pneumonia, Cirrhosis (HCC), CKD (chronic kidney disease), stage III (HCC), COPD (chronic obstructive pulmonary disease) (HCC), Depression, DVT (deep venous thrombosis) (HCC) (09/2013), GERD (gastroesophageal reflux disease), Heart murmur, Hepatitis B, History of blood transfusion, History of urinary tract infection, Hypertension, Kidney stones, Pneumonia, Pulmonary emboli (HCC) (09/2013), Reactive airway disease (08/24/2022), Shortness of breath dyspnea, Urinary frequency, and Urinary incontinence.   Surgical History:   Past Surgical History:  Procedure Laterality Date   ABDOMINAL HYSTERECTOMY     ANKLE FRACTURE SURGERY Left    BIOPSY  06/26/2022   Procedure: BIOPSY;   Surgeon: Ozell Blunt, MD;  Location: [facility];  Service: Gastroenterology;;   CARPAL TUNNEL RELEASE Left    DILATION AND CURETTAGE OF UTERUS  X 2   ESOPHAGOGASTRODUODENOSCOPY (EGD) WITH PROPOFOL  N/A 06/26/2022   Procedure: ESOPHAGOGASTRODUODENOSCOPY (EGD) WITH PROPOFOL ;  Surgeon: Ozell Blunt, MD;  Location: [facility];  Service: Gastroenterology;  Laterality: N/A;   FRACTURE SURGERY     HAND SURGERY     1980's/left hand    INSERTION OF MESH N/A 12/10/2014   Procedure: INSERTION OF MESH;  Surgeon: Candyce Champagne, MD;  Location: [facility];  Service: General;  Laterality: N/A;   LAPAROSCOPIC ASSISTED VENTRAL HERNIA REPAIR N/A 12/10/2014   Procedure: LAPAROSCOPIC ASSISTED REPAIR OF INCARCERATED UMBILICAL HERNIA ;  Surgeon: Candyce Champagne, MD;  Location: [facility];  Service: General;  Laterality: N/A;   TUBAL LIGATION       Social History:   reports that she quit smoking about 9 years ago. Her smoking use included cigarettes. She started smoking about 44 years ago. She has a 17.5 pack-year smoking history. She has never used smokeless tobacco. She reports that she does not drink alcohol and does not use drugs.   Family History:  Her family history includes Lung cancer in her father; Stroke in her mother.   Allergies Allergies  Allergen Reactions   Heparin  Hives    Pt states she cannot take because she will break out in hives   Amitriptyline  Hcl Other (See Comments)     Causes her to be very disoriented.   Azithromycin  Rash   Fentanyl  Other (See Comments)    Sees things Pt stated had hallucinations d/t taking high dosage, but can tolerate low dosage    Cymbalta [Duloxetine Hcl] Other (See Comments)    Headaches    Neomycin-Bacitracin Zn-Polymyx Rash     Home Medications  Prior to Admission medications   Medication Sig Start Date End Date Taking? Authorizing Provider  albuterol  (PROAIR  HFA) 108 (90 Base) MCG/ACT inhaler INHALE 2 PUFFS INTO THE LUNGS EVERY 4 HOURS AS NEEDED FOR  WHEEZING Patient taking differently: Inhale 2 puffs into the lungs every 4 (four) hours as needed for shortness  of breath or wheezing. 12/04/18   Lind Repine, MD  albuterol  (PROVENTIL ) (2.5 MG/3ML) 0.083% nebulizer solution Take 3 mLs (2.5 mg total) by nebulization every 4 (four) hours as needed for wheezing or shortness of breath. Patient taking differently: Take 2.5 mg by nebulization as needed for wheezing or shortness of breath. 12/04/18   Lind Repine, MD  albuterol  (VENTOLIN  HFA) 108 (90 Base) MCG/ACT inhaler Inhale 2 puffs into the lungs every 4 (four) hours as needed for wheezing or shortness of breath. 01/13/23   Guadalupe Lee, MD  albuterol  (VENTOLIN  HFA) 108 (90 Base) MCG/ACT inhaler Inhale 2 puffs into the lungs every 4 (four) hours as needed for wheezing or shortness of breath. 05/05/23   Teddi Favors, DO  ALPRAZolam  (XANAX ) 1 MG tablet Take 1 tablet (1 mg total) by mouth 3 (three) times daily. Patient taking differently: Take 1 mg by mouth 3 (three) times daily as needed for anxiety. 06/27/22   Shalhoub, Merrill Abide, MD  budesonide -formoterol  (SYMBICORT ) 160-4.5 MCG/ACT inhaler Inhale 2 puffs into the lungs 2 (two) times daily. Patient taking differently: Inhale 2 puffs into the lungs in the morning and at bedtime. 12/04/18 08/24/22  Lind Repine, MD  budesonide -formoterol  (SYMBICORT ) 80-4.5 MCG/ACT inhaler Inhale 2 puffs into the lungs in the morning and at bedtime. 05/05/23   Teddi Favors, DO  buPROPion  (WELLBUTRIN  XL) 150 MG 24 hr tablet Take 150 mg by mouth every morning.    [provider]  carvedilol  (COREG ) 12.5 MG tablet Take 1 tablet (12.5 mg total) by mouth 2 (two) times daily with a meal. 06/27/22   Shalhoub, Merrill Abide, MD  Cholecalciferol  (VITAMIN D3) 50 MCG (2000 UT) TABS Take 2,000 Units by mouth daily.    [provider]  clotrimazole -betamethasone  (LOTRISONE ) cream Apply to affected area 2 times daily prn Patient taking differently: Apply 1 application   topically as needed (rash). 04/29/20   Steinl, Kevin, MD  docusate sodium  (COLACE) 100 MG capsule Take 100 mg by mouth 2 (two) times daily.    [provider]  doxepin  (SINEQUAN ) 100 MG capsule Take 100 mg by mouth at bedtime. 06/01/22   [provider]  entecavir  (BARACLUDE ) 0.5 MG tablet Take 0.5 mg by mouth every other day. 11/25/19   [provider]  famotidine  (PEPCID ) 20 MG tablet Take 20 mg by mouth 2 (two) times daily. 10/28/19   [provider]  ferrous sulfate (FEROSUL) 325 (65 FE) MG tablet Take 325 mg by mouth daily.    [provider]  furosemide  (LASIX ) 20 MG tablet Take 20 mg by mouth daily. 07/06/22   [provider]  gabapentin  (NEURONTIN ) 300 MG capsule Take 300 mg by mouth 3 (three) times daily. 07/13/20   [provider]  levocetirizine (XYZAL) 5 MG tablet Take 5 mg by mouth every evening. 12/02/19   [provider]  methylPREDNISolone  (MEDROL  DOSEPAK) 4 MG TBPK tablet Per manufacturers instructions 05/05/23   Russella Courts A, DO  montelukast  (SINGULAIR ) 10 MG tablet Take 10 mg by mouth daily.    [provider]  naloxone  (NARCAN ) nasal spray 4 mg/0.1 mL Place into the nose. 04/14/21   [provider]  NYSTATIN powder Apply 1 Application topically as needed (yeast). 07/05/21   [provider]  ondansetron  (ZOFRAN -ODT) 8 MG disintegrating tablet Take 8 mg by mouth every 8 (eight) hours as needed for nausea or vomiting.    [provider]  oxyCODONE  (ROXICODONE ) 5 MG immediate release  tablet Take 1 tablet (5 mg total) by mouth every 6 (six) hours as needed for severe pain (pain score 7-10). 05/11/23   Onetha Bile, MD  pantoprazole  (PROTONIX ) 40 MG tablet Take 40 mg by mouth 2 (two) times daily before a meal. 12/23/13   [provider]  RIVAROXABAN  (XARELTO ) VTE STARTER PACK (15 & 20 MG) Follow package directions: Take one 15mg  tablet by mouth twice a day. On day 22, switch to  one 20mg  tablet once a day. Take with food. 08/09/22   Wynetta Heckle, MD  spironolactone  (ALDACTONE ) 25 MG tablet Take 1 tablet (25 mg total) by mouth daily. 06/27/22   Shalhoub, Merrill Abide, MD  sucralfate  (CARAFATE ) 1 g tablet Take 1 g by mouth 4 (four) times daily. 07/22/19   [provider]  valACYclovir (VALTREX) 500 MG tablet Take 500 mg by mouth daily as needed (breakout). 08/21/17   [provider]  enoxaparin  (LOVENOX ) 120 MG/0.8ML SOLN Inject 0.8 mLs (120 mg total) into the skin every 12 (twelve) hours. 11/30/10 04/16/11  Antionette Bath, MD  loratadine  (CLARITIN ) 10 MG tablet Take 10 mg by mouth daily.    04/16/11  [provider]     Critical care time:  45 min.    Joesph Mussel, DO 07/10/23 12:42 PM [facility]  For contact information, see Amion. If no response to pager, please call PCCM consult pager. After hours, 7PM- 7AM, please call Elink.

## 2023-07-10 NOTE — ED Notes (Signed)
 Trauma Response Nurse Documentation   [name] is a 68 y.o. female arriving to [facility] ED via EMS  On Xarelto  (rivaroxaban ) daily. Trauma was activated as a Level 2 by ED Charge RN based on the following trauma criteria Elderly patients > 65 with head trauma on anti-coagulation (excluding ASA).  Patient cleared for CT by Dr. Adrain Alar. Pt transported to CT with trauma response nurse present to monitor. RN remained with the patient throughout their absence from the department for clinical observation.   GCS 14.  Trauma MD Arrival Time: 29 Dr Aniceto Barley. After upgrade to L1 @ 1042.  History   Past Medical History:  Diagnosis Date   Agoraphobia with panic attacks    Altered mental state 10/26/2013   Ankle fracture, left 11/27/2010   S/p ORIF 11/2    Anxiety    Arthritis    knees; lower back (10/30/2013)   Bipolar disorder (HCC)    Cavitary pneumonia    Cirrhosis (HCC)    CKD (chronic kidney disease), stage III (HCC)    COPD (chronic obstructive pulmonary disease) (HCC)    Depression    DVT (deep venous thrombosis) (HCC) 09/2013   RLE   GERD (gastroesophageal reflux disease)    Heart murmur    Hepatitis B    History of blood transfusion    due to excessive blood loss before hysterectomy   History of urinary tract infection    Hypertension    currently on no medication    Kidney stones    Pneumonia    4 times in the past year (10/30/2013)   Pulmonary emboli (HCC) 09/2013   Reactive airway disease 08/24/2022   Shortness of breath dyspnea    increased exertion;exercise   Urinary frequency    Urinary incontinence      Past Surgical History:  Procedure Laterality Date   ABDOMINAL HYSTERECTOMY     ANKLE FRACTURE SURGERY Left    BIOPSY  06/26/2022   Procedure: BIOPSY;  Surgeon: Ozell Blunt, MD;  Location: [facility];  Service: Gastroenterology;;   CARPAL TUNNEL RELEASE Left    DILATION AND CURETTAGE OF UTERUS  X 2   ESOPHAGOGASTRODUODENOSCOPY (EGD) WITH PROPOFOL  N/A  06/26/2022   Procedure: ESOPHAGOGASTRODUODENOSCOPY (EGD) WITH PROPOFOL ;  Surgeon: Ozell Blunt, MD;  Location: [facility];  Service: Gastroenterology;  Laterality: N/A;   FRACTURE SURGERY     HAND SURGERY     1980's/left hand    INSERTION OF MESH N/A 12/10/2014   Procedure: INSERTION OF MESH;  Surgeon: Candyce Champagne, MD;  Location: [facility];  Service: General;  Laterality: N/A;   LAPAROSCOPIC ASSISTED VENTRAL HERNIA REPAIR N/A 12/10/2014   Procedure: LAPAROSCOPIC ASSISTED REPAIR OF INCARCERATED UMBILICAL HERNIA ;  Surgeon: Candyce Champagne, MD;  Location: [facility];  Service: General;  Laterality: N/A;   TUBAL LIGATION       Initial Focused Assessment (If applicable, or please see trauma documentation): Airway: Intact, patent, Dried blood noted to lips and tongue Breathing: Breath sounds expiratory wheezes and rhonchi auscultated bilaterally.  Pt on 4L [place] with SpO2 of 98%.  Circulation: No significant signs of external hemorrhage noted.  Dried blood to mouth but airway otherwise intact. Right great toe small abrasion noted.  18G PIV to R wrist Disability: PERRLA 2's brisk. Perseverating/repetitive questioning. Confusion present. Unsure if pt took too many xanaxs per family.   CT's Completed:   CT Head, CT C-Spine, CT Chest w/ contrast, and CT abdomen/pelvis w/ contrast   Interventions:  *18G PIV  to R [facility] *Trauma labs drawn *CXR *Pelvic XR *CT pan scan *2 L NS bolus given *Undressed and assessed *Logrolled pt - no stepoffs or deformities noted *antibx given  Plan for disposition:  Admission to ICU with CCM  Consults completed:  none at 1050.  Event Summary: Pt was BIB [facility] from home after sustaining an unwitnessed fall.  Pt was found face down with dried blood to her mouth and seemingly confused.  Pt is on xarelto .  Family is questioning OD on xanax  (poss 1-3 tabs). Pt reports that she fell twice but she is also having some confusion and repetitive questioning.  Pt experiencing some  respiratory difficulty consistent with crackles and expiratory wheezes.  Duoneb and zofran  given by EMS en route.  Pt c/o R shoulder, low back and chest pain.  Per family, pt has done this before when they go out of town and they are in fact out of town currently.  Trauma workup negative and being admitted to CCM.   Bedside handoff with ED RN Demetra Filter.    Juan Noel  Trauma Response RN  Please call TRN at [phone] for further assistance.

## 2023-07-10 NOTE — ED Notes (Signed)
To CT via stretcher on monitor with RN

## 2023-07-10 NOTE — Plan of Care (Signed)
  Problem: Coping: Goal: Ability to adjust to condition or change in health will improve Outcome: Progressing   Problem: Fluid Volume: Goal: Ability to maintain a balanced intake and output will improve Outcome: Progressing   Problem: Metabolic: Goal: Ability to maintain appropriate glucose levels will improve Outcome: Progressing   Problem: Skin Integrity: Goal: Risk for impaired skin integrity will decrease Outcome: Progressing   Problem: Tissue Perfusion: Goal: Adequacy of tissue perfusion will improve Outcome: Progressing   Problem: Education: Goal: Knowledge of General Education information will improve Description: Including pain rating scale, medication(s)/side effects and non-pharmacologic comfort measures Outcome: Progressing

## 2023-07-10 NOTE — TOC Initial Note (Signed)
 Transition of Care [facility]) - Initial/Assessment Note    Patient Details  Name: [name] MRN: [id number] Date of Birth: Apr 16, 1955  [facility] CM/SW Contact:    Juliane Och, LCSW Phone Number: 07/10/2023, 3:19 PM  Clinical Narrative:                  3:19 PM CSW spoke with patinet's daughter, [name] (patient is not currently fully oriented). Logan informed CSW that patient resides with her, patient's two adult grandchildren, and two minor grandchildren. [name] stated that she is out of the county until the end of June but that patient's adult grandchildren could provide transportation and home support upon discharge. Logan confirmed that patient has SNF history [facility] [employer] per chart review). Logan confirmed patient has HH history ([facility] per chart review). Logan confirmed patient has DME (shower chair, walker, cane, nebulizer, does not use BSC or oxygen ). [name] stated that patient has been using cane more often than usual recently. Logan confirmed patient's preferred pharmacy's [facility] [address], [place] [address], [facility]) and stated that patient received home delivered medications through [employer] Per chart review, patient has a PCP and insurance.   Expected Discharge Plan: Home/Self Care Barriers to Discharge: Continued Medical Work up   Patient Goals and CMS Choice            Expected Discharge Plan and Services       Living arrangements for the past 2 months: Single Family Home                                      Prior Living Arrangements/Services Living arrangements for the past 2 months: Single Family Home Lives with:: Adult Children, Relatives Patient language and need for interpreter reviewed:: Yes        Need for Family Participation in Patient Care: Yes (Comment) Care giver support system in place?: Yes (comment)   Criminal Activity/Legal Involvement Pertinent  to Current Situation/Hospitalization: No - Comment as needed  Activities of Daily Living      Permission Sought/Granted Permission sought to share information with : Family Supports Permission granted to share information with : No (Contact information on chart)  Share Information with NAME: [name]     Permission granted to share info w Relationship: Daughter  Permission granted to share info w Contact Information: [phone]  Emotional Assessment Appearance:: Appears stated age Attitude/Demeanor/Rapport: Unable to Assess Affect (typically observed): Unable to Assess   Alcohol / Substance Use: Not Applicable Psych Involvement: No (comment)  Admission diagnosis:  Hepatic encephalopathy (HCC) [K76.82] Acute respiratory failure with hypoxia (HCC) [J96.01] Septic shock (HCC) [A41.9, R65.21] Medication overdose, undetermined intent, initial encounter [T50.904A] Cirrhosis of liver without ascites, unspecified hepatic cirrhosis type (HCC) [K74.60] Aspiration pneumonia, unspecified aspiration pneumonia type, unspecified laterality, unspecified part of lung (HCC) [J69.0] Patient Active Problem List   Diagnosis Date Noted   Septic shock (HCC) 07/10/2023   Acute metabolic encephalopathy 08/24/2022   Bipolar disorder (HCC) 08/24/2022   Hyponatremia 08/24/2022   Hypokalemia 08/24/2022   Reactive airway disease 08/24/2022   Acute pulmonary embolism (HCC) 08/09/2022   Pain and swelling of right lower leg 08/09/2022   Chronic diastolic CHF (congestive heart failure) (HCC) 06/25/2022   ABLA (acute blood loss anemia) 06/24/2022   Severe sepsis with acute organ dysfunction (HCC) 01/04/2020   Primary osteoarthritis of right knee 12/04/2019  Primary osteoarthritis of left knee 12/04/2019   Vitamin D  deficiency 09/20/2019   Cellulitis of foot 09/18/2019   Aspiration pneumonia of both lower lobes due to gastric secretions (HCC) 06/02/2019   Lumbar disc herniation 05/04/2019    Encounter for colorectal cancer screening 04/19/2019   Acute respiratory distress 04/06/2019   Hyperglycemia 04/06/2019   Pleural effusion on right 04/06/2019   Benzodiazepine dependence (HCC) 02/16/2019   Cervical stenosis of spinal canal 02/15/2019   Streptococcal sepsis (HCC) 01/22/2019   History of sepsis 01/22/2019   Acute bronchitis due to Rhinovirus 11/06/2018   Lung collapse 11/01/2018   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 08/13/2018   Compression fracture of body of thoracic vertebra (HCC) 08/01/2018   Closed nondisplaced fracture of greater tuberosity of right humerus 07/08/2018   On rivaroxaban  therapy 07/08/2018   Chronic anticoagulation 05/27/2018   Falls frequently 05/27/2018   History of DVT (deep vein thrombosis) 03/16/2018   History of pulmonary embolism 03/10/2018   Left lower quadrant abdominal pain 03/10/2018   Chronic pain syndrome 03/05/2018   COPD exacerbation (HCC) 06/09/2016   Abnormal urine odor 09/26/2015   Recurrent pulmonary embolism (HCC) 06/26/2015   Lupus anticoagulant positive 06/26/2015   Thrombocytopenia (HCC) 05/31/2015   Lower urinary tract infectious disease 05/26/2015   Right flank pain    Patchy loss of hair 03/08/2015   Primary osteoarthritis of both knees 02/25/2015   Incarcerated incisional hernia s/p lap repair w mesh 12/10/2014 12/10/2014   DVT (deep venous thrombosis) on Xarelto  (HCC) 10/31/2013   Pulmonary embolism (HCC) 10/26/2013   Kidney stone 10/04/2013   Tobacco abuse 10/04/2013   COPD (chronic obstructive pulmonary disease) (HCC)    Constipation 12/23/2012   Chronic kidney disease (CKD), stage III (moderate) (HCC) 12/21/2012   Allergic rhinitis 06/29/2012   Chronic low back pain 06/29/2012   Dysuria 06/29/2012   Edema 06/29/2012   Gastro-esophageal reflux disease without esophagitis 06/29/2012   Hemorrhoids 06/29/2012   Hirsutism 06/29/2012   Insomnia 06/29/2012   Localized superficial  swelling, mass, or lump 06/29/2012   Urinary incontinence 06/29/2012   Skin sensation disturbance 06/29/2012   Shortness of breath 06/29/2012   Pulmonary embolism and infarction (HCC) 06/29/2012   Proteinuria 06/29/2012   Nondependent cannabis abuse 06/29/2012   Acquired cyst of kidney 10/24/2011   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   Cocaine abuse (HCC) 11/27/2010   Chronic depression 09/23/2009   Ascites 09/23/2009   Obesity 09/01/2008   Chronic hepatitis B with cirrhosis (HCC) 08/31/2008   Acute hepatitis C virus infection 08/31/2008   Iron deficiency anemia due to chronic blood loss 08/31/2008   Generalized anxiety disorder 08/31/2008   Essential hypertension 08/31/2008   Coronary atherosclerosis 08/31/2008   Hepatitis B related cirrhosis (HCC) 08/31/2008   History of hepatitis C 08/31/2008   PCP:  Authur Leghorn, MD Pharmacy:   [id number] Jonette Nestle, [place] - [address] AT [facility] & [address] [place] [address] Phone: [phone] Fax: [phone]  [facility] [address], [place] - [address] AT [address] [place] [address] Phone: [phone] Fax: [phone]  [facility] [place], [place] - [address] [facility] [address] [place] [address] Phone: [phone] Fax: [phone]     Social Drivers of Health (SDOH) Social History: SDOH Screenings   Food Insecurity: No Food Insecurity (06/27/2023)  Received from [facility]  Recent Concern: Food Insecurity - Food Insecurity Present (04/16/2023)   Received from [facility]  Housing: Low Risk  (06/27/2023)   Received from [facility]  Transportation Needs: No Transportation Needs (06/27/2023)   [facility]  Utilities: Not At Risk (06/27/2023)   Received from [facility]  Financial Resource Strain: Low Risk  (06/27/2023)   Received from [facility]   Physical Activity: Unknown (06/27/2023)   Received from [facility]  Social Connections: Socially Integrated (06/27/2023)   Received from [facility]  Stress: No Stress Concern Present (06/27/2023)   Received from [facility]  Tobacco Use: Medium Risk (07/10/2023)   SDOH Interventions:     Readmission Risk Interventions    08/24/2022   11:55 AM 06/26/2022   12:01 PM  Readmission Risk Prevention Plan  Transportation Screening Complete Complete  PCP or Specialist Appt within 5-7 Days  Complete  Home Care Screening  Complete  Medication Review (RN CM)  Complete  HRI or Home Care Consult Complete   SW Recovery Care/Counseling Consult Complete

## 2023-07-10 NOTE — ED Triage Notes (Signed)
 BIB [facility] from home s/p FOT, level 2 trauma, takes xarelto , questionable OD on xanax , took 1-3 tabs, sBP 90, described as witnessed fall, fell against dresser, hit face and head, reports fell x2, bleeding noted from mouth, concern for aspiration, appears dry/ dehydrated, arrives alert, NAD, calm, interactive, answering some questions, some confusion, GCS 14, follows commands, c/o pain in R shoulder, low back, and chest, admits to nausea. Denies HA. NSL 18g, R wrist. Zofran  and duoneb given. Arrives to full trauma team.

## 2023-07-10 NOTE — ED Notes (Addendum)
 EDP, Trauma MD, pharmD at [facility], Levo started, no changes, pt remains alert, NAD, calm, interactive, confused, answers questions, follows commands, 2 liter bolus complete, levo started. C-collar remov. BC obtained, then unasyn  started, lactic repeated.

## 2023-07-10 NOTE — ED Notes (Signed)
 Alert, NAD, calm, c/o neck pain and dry mouth. IVF bolus complete.

## 2023-07-11 LAB — CBC
HCT: 28.6 % — ABNORMAL LOW (ref 36.0–46.0)
Hemoglobin: 8.5 g/dL — ABNORMAL LOW (ref 12.0–15.0)
MCH: 25.2 pg — ABNORMAL LOW (ref 26.0–34.0)
MCHC: 29.7 g/dL — ABNORMAL LOW (ref 30.0–36.0)
MCV: 84.9 fL (ref 80.0–100.0)
Platelets: 133 10*3/uL — ABNORMAL LOW (ref 150–400)
RBC: 3.37 MIL/uL — ABNORMAL LOW (ref 3.87–5.11)
RDW: 15.4 % (ref 11.5–15.5)
WBC: 11.4 10*3/uL — ABNORMAL HIGH (ref 4.0–10.5)
nRBC: 0 % (ref 0.0–0.2)

## 2023-07-11 LAB — COMPREHENSIVE METABOLIC PANEL WITH GFR
ALT: 16 U/L (ref 0–44)
AST: 35 U/L (ref 15–41)
Albumin: 2.3 g/dL — ABNORMAL LOW (ref 3.5–5.0)
Alkaline Phosphatase: 59 U/L (ref 38–126)
Anion gap: 9 (ref 5–15)
BUN: 16 mg/dL (ref 8–23)
CO2: 22 mmol/L (ref 22–32)
Calcium: 8.2 mg/dL — ABNORMAL LOW (ref 8.9–10.3)
Chloride: 106 mmol/L (ref 98–111)
Creatinine, Ser: 1.48 mg/dL — ABNORMAL HIGH (ref 0.44–1.00)
GFR, Estimated: 39 mL/min — ABNORMAL LOW (ref >60)
Glucose, Bld: 106 mg/dL — ABNORMAL HIGH (ref 70–99)
Potassium: 3.8 mmol/L (ref 3.5–5.1)
Sodium: 137 mmol/L (ref 135–145)
Total Bilirubin: 0.7 mg/dL (ref 0.0–1.2)
Total Protein: 5.4 g/dL — ABNORMAL LOW (ref 6.5–8.1)

## 2023-07-11 LAB — GLUCOSE, CAPILLARY
Glucose-Capillary: 107 mg/dL — ABNORMAL HIGH (ref 70–99)
Glucose-Capillary: 94 mg/dL (ref 70–99)
Glucose-Capillary: 97 mg/dL (ref 70–99)
Glucose-Capillary: 97 mg/dL (ref 70–99)
Glucose-Capillary: 79 mg/dL (ref 70–99)
Glucose-Capillary: 81 mg/dL (ref 70–99)

## 2023-07-11 LAB — LEGIONELLA PNEUMOPHILA SEROGP 1 UR AG: L. pneumophila Serogp 1 Ur Ag: NEGATIVE

## 2023-07-11 LAB — PHOSPHORUS: Phosphorus: 3.2 mg/dL (ref 2.5–4.6)

## 2023-07-11 LAB — MAGNESIUM: Magnesium: 1.6 mg/dL — ABNORMAL LOW (ref 1.7–2.4)

## 2023-07-11 MED ORDER — MAGNESIUM SULFATE 4 GM/100ML IV SOLN
4.0000 g | Freq: Once | INTRAVENOUS | Status: AC
  Administered 2023-07-11: 4 g via INTRAVENOUS
  Filled 2023-07-11: qty 100

## 2023-07-11 MED ORDER — MIDODRINE HCL 5 MG PO TABS: 10.0000 mg | ORAL_TABLET | Freq: Three times a day (TID) | ORAL | Status: DC

## 2023-07-11 MED ORDER — MIDODRINE HCL 5 MG PO TABS
10.0000 mg | ORAL_TABLET | Freq: Three times a day (TID) | ORAL | Status: DC
  Administered 2023-07-11 – 2023-07-15 (×11): 10 mg via ORAL
  Filled 2023-07-11 (×11): qty 2

## 2023-07-11 MED ORDER — POTASSIUM CHLORIDE CRYS ER 20 MEQ PO TBCR
40.0000 meq | EXTENDED_RELEASE_TABLET | Freq: Once | ORAL | Status: AC
  Administered 2023-07-11: 40 meq via ORAL
  Filled 2023-07-11: qty 2

## 2023-07-11 MED ORDER — SODIUM CHLORIDE 0.9 % IV SOLN
2.0000 g | INTRAVENOUS | Status: DC
  Administered 2023-07-11 – 2023-07-16 (×6): 2 g via INTRAVENOUS
  Filled 2023-07-11 (×7): qty 20

## 2023-07-11 NOTE — Evaluation (Signed)
 Occupational Therapy Evaluation Patient Details Name: [name] MRN: [id number] DOB: Nov 22, 1955 Today's Date: 07/11/2023   History of Present Illness   68 y/o woman with a history of PE on Xarelto  who presented with fall at home hitting her head, on xarelto . She had taken 2-3 Xanax  this morning. With EMS she had concern for aspiration and dehydration. In the ED she was hypotensive, given IVF with ongoing MAPs in the 40s requiring vasopressors to be started peripherally, transferred to ICU for treatment of septic shock, multilobar PNA, acute respiratory failure, acute encephalopathy, AKI on CKD 2PMH: PE, DVT on Xarelto , cirrhosis, chronic benzo use, HTN, HBV, GERD, Bipolar disorder, peripheral neuropathy     Clinical Impressions Pt reports living with her daughter, uses rollator for mobility and daughter assists with ADLs. Pt currently in 8/10 pain due to BLE neuropathy. Pt needs up to max A for ADLs, min +2 for bed mobility and mod +2 for transfers with RW. Pt SpO2 down to mid 80s on RA, left on 3L O2 at end of session. Pt presenting with impairments listed below, will follow acutely. Patient will benefit from continued inpatient follow up therapy, <3 hours/day to maximize safety/ind with ADL/functional mobility.      If plan is discharge home, recommend the following:   A lot of help with bathing/dressing/bathroom;A lot of help with walking and/or transfers;Direct supervision/assist for medications management;Direct supervision/assist for financial management;Assist for transportation;Help with stairs or ramp for entrance;Assistance with cooking/housework     Functional Status Assessment   Patient has had a recent decline in their functional status and demonstrates the ability to make significant improvements in function in a reasonable and predictable amount of time.     Equipment Recommendations   Other (comment) (defer)     Recommendations for Other Services   PT  consult     Precautions/Restrictions   Precautions Precautions: Fall Restrictions Weight Bearing Restrictions Per Provider Order: No     Mobility Bed Mobility Overal bed mobility: Needs Assistance Bed Mobility: Supine to Sit, Sit to Supine, Rolling Rolling: Contact guard assist, Used rails   Supine to sit: HOB elevated, Used rails, +2 for physical assistance, Max assist Sit to supine: Min assist, +2 for safety/equipment        Transfers Overall transfer level: Needs assistance Equipment used: Rolling walker (2 wheels) Transfers: Sit to/from Stand, Bed to chair/wheelchair/BSC Sit to Stand: Mod assist, Min assist, +2 physical assistance, From elevated surface     Step pivot transfers: Mod assist, +2 safety/equipment            Balance Overall balance assessment: Needs assistance Sitting-balance support: Feet supported, Bilateral upper extremity supported Sitting balance-Leahy Scale: Fair     Standing balance support: Bilateral upper extremity supported, No upper extremity supported, During functional activity, Reliant on assistive device for balance Standing balance-Leahy Scale: Fair Standing balance comment: requires RW for support                           ADL either performed or assessed with clinical judgement   ADL Overall ADL's : Needs assistance/impaired Eating/Feeding: Set up;Sitting   Grooming: Minimal assistance;Sitting   Upper Body Bathing: Minimal assistance;Sitting   Lower Body Bathing: Maximal assistance   Upper Body Dressing : Minimal assistance;Sitting   Lower Body Dressing: Maximal assistance;Sitting/lateral leans   Toilet Transfer: Minimal assistance;Moderate assistance;+2 for physical assistance   Toileting- Clothing Manipulation and Hygiene: Moderate assistance  Functional mobility during ADLs: Minimal assistance;Moderate assistance;+2 for physical assistance       Vision Baseline Vision/History: 1 Wears  glasses Vision Assessment?: No apparent visual deficits     Perception Perception: Not tested       Praxis Praxis: Not tested       Pertinent Vitals/Pain Pain Assessment Pain Assessment: Faces Pain Score: 8  Faces Pain Scale: Hurts whole lot Pain Location: bilateral LE neuropathy from knee down Pain Descriptors / Indicators: Sharp, Shooting, Grimacing, Guarding Pain Intervention(s): Limited activity within patient's tolerance, Monitored during session, Repositioned     Extremity/Trunk Assessment Upper Extremity Assessment Upper Extremity Assessment: Generalized weakness   Lower Extremity Assessment Lower Extremity Assessment: Defer to PT evaluation RLE Deficits / Details: ROM limited due to body habitus, generalized weakness RLE Sensation: history of peripheral neuropathy RLE Coordination: decreased fine motor LLE Deficits / Details: ROM limited due to body habitus, generalized weakness LLE Sensation: decreased proprioception LLE Coordination: decreased fine motor   Cervical / Trunk Assessment Cervical / Trunk Assessment: Kyphotic   Communication Communication Communication: Impaired Factors Affecting Communication: Reduced clarity of speech   Cognition Arousal: Lethargic Behavior During Therapy: Flat affect, Impulsive                                 Following commands: Intact       Cueing  General Comments   Cueing Techniques: Verbal cues;Gestural cues;Tactile cues;Visual cues  pt wtih incr BLE pain with mobility, desatting to mid 80s on RA at end of session, left on 3L O2   Exercises     Shoulder Instructions      Home Living Family/patient expects to be discharged to:: Private residence Living Arrangements: Children Available Help at Discharge: Family;Available PRN/intermittently (daughter works) Type of Home: House Home Access: Stairs to enter Secretary/administrator of Steps: flight Entrance Stairs-Rails: Right Home Layout: Able to  live on main level with bedroom/bathroom     Bathroom Shower/Tub: Chief Strategy Officer: Standard     Home Equipment: Agricultural consultant (2 wheels);Jeananne Mighty - single point   Additional Comments: lives with her daughter, [name] and her grandchildren      Prior Functioning/Environment Prior Level of Function : Needs assist       Physical Assist : ADLs (physical)   ADLs (physical): Toileting Mobility Comments: uses Rollator in home, seldom goes out due to increased neuropathic pain in LE and generalized weakness ADLs Comments: daughter assists with ADLs    OT Problem List: Decreased strength;Decreased range of motion;Decreased activity tolerance;Impaired balance (sitting and/or standing);Decreased cognition;Decreased safety awareness;Cardiopulmonary status limiting activity   OT Treatment/Interventions: Self-care/ADL training;Therapeutic exercise;Energy conservation;DME and/or AE instruction;Therapeutic activities;Cognitive remediation/compensation;Patient/family education;Balance training      OT Goals(Current goals can be found in the care plan section)   Acute Rehab OT Goals Patient Stated Goal: none stated OT Goal Formulation: With patient Time For Goal Achievement: 07/25/23 Potential to Achieve Goals: Good ADL Goals Pt Will Perform Upper Body Dressing: with contact guard assist;sitting Pt Will Perform Lower Body Dressing: with contact guard assist;sit to/from stand;sitting/lateral leans Pt Will Transfer to Toilet: with contact guard assist;ambulating;regular height toilet Pt Will Perform Tub/Shower Transfer: with contact guard assist;ambulating   OT Frequency:  Min 2X/week    Co-evaluation PT/OT/SLP Co-Evaluation/Treatment: Yes Reason for Co-Treatment: Complexity of the patient's impairments (multi-system involvement);To address functional/ADL transfers;For patient/therapist safety;Necessary to address cognition/behavior during functional activity PT goals  addressed during session:  Mobility/safety with mobility OT goals addressed during session: ADL's and self-care      AM-PAC OT 6 Clicks Daily Activity     Outcome Measure Help from another person eating meals?: A Little Help from another person taking care of personal grooming?: A Little Help from another person toileting, which includes using toliet, bedpan, or urinal?: A Lot Help from another person bathing (including washing, rinsing, drying)?: A Lot Help from another person to put on and taking off regular upper body clothing?: A Little Help from another person to put on and taking off regular lower body clothing?: A Lot 6 Click Score: 15   End of Session Equipment Utilized During Treatment: Gait belt;Rolling walker (2 wheels);Oxygen  Nurse Communication: Mobility status (pt on bedpan)  Activity Tolerance: Patient tolerated treatment well Patient left: in bed;with call bell/phone within reach;with bed alarm set  OT Visit Diagnosis: Unsteadiness on feet (R26.81);Other abnormalities of gait and mobility (R26.89);Muscle weakness (generalized) (M62.81)                Time: 9147-8295 OT Time Calculation (min): 30 min Charges:  OT General Charges $OT Visit: 1 Visit OT Evaluation $OT Eval Moderate Complexity: 1 Mod  Marquay Kruse K, OTD, OTR/L SecureChat Preferred Acute Rehab [phone]   Benedict Brain Koonce 07/11/2023, 12:39 PM

## 2023-07-11 NOTE — Evaluation (Signed)
 Physical Therapy Evaluation Patient Details Name: [name] MRN: [id number] DOB: [birth year] Today's Date: 07/11/2023  History of Present Illness  68 y/o woman with a history of PE on Xarelto  who presented with fall at home hitting her head, on xarelto . She had taken 2-3 Xanax  this morning. With EMS she had concern for aspiration and dehydration. In the ED she was hypotensive, given IVF with ongoing MAPs in the 40s requiring vasopressors to be started peripherally, transferred to ICU for treatment of septic shock, multilobar PNA, acute respiratory failure, acute encephalopathy, AKI on CKD 2PMH: PE, DVT on Xarelto , cirrhosis, chronic benzo use, HTN, HBV, GERD, Bipolar disorder, peripheral neuropathy  Clinical Impression  PTA pt living with daughter and grandchildren in second story apartment with 2 flights of steps to enter. Pt reports using Rollator in home for ambulation and seldom going out due to increased difficulty navigating steps. Pt requiring assistance from her daughter for ADLs and iADLs. Pt is currently limited in safe mobility by decreased safety awareness, impulsivity and decreased command follow, in presence of history of bilateral LE neuropathy, generalized weakness and decreased balance. Pt is requiring min-modAx2 for bed mobility, and transfer to and from recliner. Patient will benefit from continued inpatient follow up therapy, <3 hours/day. PT will continue to follow acutely.         If plan is discharge home, recommend the following: A little help with walking and/or transfers;A little help with bathing/dressing/bathroom;Assistance with cooking/housework;Direct supervision/assist for medications management;Direct supervision/assist for financial management;Assist for transportation;Help with stairs or ramp for entrance;Supervision due to cognitive status   Can travel by private vehicle   No    Equipment Recommendations BSC/3in1;Wheelchair (measurements PT);Wheelchair cushion  (measurements PT)  Recommendations for Other Services       Functional Status Assessment Patient has had a recent decline in their functional status and demonstrates the ability to make significant improvements in function in a reasonable and predictable amount of time.     Precautions / Restrictions Precautions Precautions: Fall Restrictions Weight Bearing Restrictions Per Provider Order: No      Mobility  Bed Mobility Overal bed mobility: Needs Assistance Bed Mobility: Supine to Sit, Sit to Supine, Rolling Rolling: Contact guard assist, Used rails   Supine to sit: HOB elevated, Used rails, +2 for physical assistance, Max assist Sit to supine: Min assist, +2 for safety/equipment   General bed mobility comments: impulsive, requiring cuing for lines and leads, requires maxAx2 for management of LE off bed and for squaring hips to EoB, requires min Ax2 for line management and positioning of hips and shoulders able to roll onto L side for placement of bed pan    Transfers Overall transfer level: Needs assistance Equipment used: Rolling walker (2 wheels) Transfers: Sit to/from Stand, Bed to chair/wheelchair/BSC Sit to Stand: Mod assist, Min assist, +2 physical assistance, From elevated surface   Step pivot transfers: Mod assist, +2 safety/equipment       General transfer comment: min Ax2 for steadying with initial stand from elevated bed, pt limited by increased neuropathic pain in LE, able to step pivot with min physical Ax2 but modA for management of IV pole and lines, pt with poor safety awareness, immediately upon sitting pt reports need to have BM, unable to find appropriate BSC on floor so assisted pt with coming to standing with modAx2 and for stepping back to bed for use of bed pan    Ambulation/Gait  General Gait Details: deferred due to increased LE pain in dependent position        Balance Overall balance assessment: Needs  assistance Sitting-balance support: Feet supported, Bilateral upper extremity supported Sitting balance-Leahy Scale: Fair     Standing balance support: Bilateral upper extremity supported, No upper extremity supported, During functional activity, Reliant on assistive device for balance Standing balance-Leahy Scale: Fair Standing balance comment: requires RW for support                             Pertinent Vitals/Pain Pain Assessment Pain Assessment: Faces Faces Pain Scale: Hurts whole lot Pain Location: bilateral LE neuropathy from knee down Pain Descriptors / Indicators: Sharp, Shooting, Grimacing, Guarding (hollering) Pain Intervention(s): Limited activity within patient's tolerance, Monitored during session, Repositioned, Patient requesting pain meds-RN notified    Home Living Family/patient expects to be discharged to:: Private residence Living Arrangements: Children Available Help at Discharge: Family;Available PRN/intermittently (daughter works) Type of Home: House Home Access: Stairs to enter Entrance Stairs-Rails: Right Entrance Stairs-Number of Steps: flight   Home Layout: Able to live on main level with bedroom/bathroom Home Equipment: Agricultural consultant (2 wheels);Jeananne Mighty - single point Additional Comments: lives with her daughter, [name] and her grandchildren    Prior Function Prior Level of Function : Needs assist       Physical Assist : ADLs (physical)   ADLs (physical): Toileting Mobility Comments: uses Rollator in home, seldom goes out due to increased neuropathic pain in LE and generalized weakness ADLs Comments: daughter assists with ADLs     Extremity/Trunk Assessment   Upper Extremity Assessment Upper Extremity Assessment: Defer to OT evaluation    Lower Extremity Assessment Lower Extremity Assessment: RLE deficits/detail;LLE deficits/detail RLE Deficits / Details: ROM limited due to body habitus, generalized weakness RLE Sensation: history  of peripheral neuropathy RLE Coordination: decreased fine motor LLE Deficits / Details: ROM limited due to body habitus, generalized weakness LLE Sensation: decreased proprioception LLE Coordination: decreased fine motor    Cervical / Trunk Assessment Cervical / Trunk Assessment: Kyphotic  Communication   Communication Communication: Impaired Factors Affecting Communication: Reduced clarity of speech (improved within session)    Cognition Arousal: Lethargic Behavior During Therapy: Flat affect, Impulsive   PT - Cognitive impairments: Attention, Awareness, Sequencing, Problem solving, Safety/Judgement, Orientation   Orientation impairments: Time                   PT - Cognition Comments: oriented to month not day Following commands: Intact (although impulsive)       Cueing Cueing Techniques: Verbal cues, Gestural cues, Tactile cues, Visual cues     General Comments General comments (skin integrity, edema, etc.): pt hollering in pain with movement of LE into dependent position, pt reports increased neuropathic pain. BP in 90s/50s, SpO2 on RA at rest 97%O2, with exertion SpO2 drops to high 80s [place] with 3L O2 returned        Assessment/Plan    PT Assessment Patient needs continued PT services  PT Problem List Decreased strength;Decreased range of motion;Decreased activity tolerance;Decreased balance;Decreased mobility;Decreased coordination;Decreased cognition;Decreased safety awareness;Cardiopulmonary status limiting activity;Impaired sensation;Pain       PT Treatment Interventions DME instruction;Gait training;Stair training;Functional mobility training;Therapeutic activities;Therapeutic exercise;Balance training;Cognitive remediation;Patient/family education    PT Goals (Current goals can be found in the Care Plan section)  Acute Rehab PT Goals PT Goal Formulation: With patient Time For Goal Achievement: 07/25/23 Potential to Achieve Goals: Fair  Frequency  Min 2X/week     Co-evaluation PT/OT/SLP Co-Evaluation/Treatment: Yes Reason for Co-Treatment: Complexity of the patient's impairments (multi-system involvement);To address functional/ADL transfers;For patient/therapist safety;Necessary to address cognition/behavior during functional activity PT goals addressed during session: Mobility/safety with mobility         AM-PAC PT 6 Clicks Mobility  Outcome Measure Help needed turning from your back to your side while in a flat bed without using bedrails?: A Little Help needed moving from lying on your back to sitting on the side of a flat bed without using bedrails?: Total Help needed moving to and from a bed to a chair (including a wheelchair)?: Total Help needed standing up from a chair using your arms (e.g., wheelchair or bedside chair)?: Total Help needed to walk in hospital room?: Total Help needed climbing 3-5 steps with a railing? : Total 6 Click Score: 8    End of Session Equipment Utilized During Treatment: Gait belt;Oxygen  Activity Tolerance: Patient limited by pain Patient left: in bed;with call bell/phone within reach;with bed alarm set;Other (comment) (on bed pan) Nurse Communication: Mobility status;Other (comment);Patient requests pain meds (on bed pan) PT Visit Diagnosis: Unsteadiness on feet (R26.81);Other abnormalities of gait and mobility (R26.89);Muscle weakness (generalized) (M62.81);Difficulty in walking, not elsewhere classified (R26.2);Other symptoms and signs involving the nervous system (R29.898);Pain Pain - Right/Left:  (bilateral) Pain - part of body: Ankle and joints of foot (below knee neuropathy)    Time: 1610-9604 PT Time Calculation (min) (ACUTE ONLY): 33 min   Charges:   PT Evaluation $PT Eval Moderate Complexity: 1 Mod   PT General Charges $$ ACUTE PT VISIT: 1 Visit         [name] PT, DPT Acute Rehabilitation Services Please use secure chat or  Call Office [phone]   Verlie Glisson [facility] 07/11/2023, 11:34 AM

## 2023-07-11 NOTE — Progress Notes (Signed)
 NAME:  [name], MRN:  [id number], DOB:  1955/10/16, LOS: 1 ADMISSION DATE:  07/10/2023, CONSULTATION DATE:  6/17 REFERRING MD:  Aleene Amor, CHIEF COMPLAINT:  septic shock   History of Present Illness:  Ms. [name] is a 68 y/o woman with a history of PE on Xarelto  who presented with fall at home hitting her head, on xarelto . She had taken 2-3 Xanax  this morning. With EMS she had concern for aspiration and dehydration. In the ED she was hypotensive, given IVF with ongoing MAPs in the 40s requiring vasopressors to be started peripherally. She says yes to all questions-- asking about fevers, cough, vomiting, diarrhea. No family at bedside to corroborate history. Blood cultures were collected and she was started on unasyn  for possible aspiration pneumonia. PCCM consulted for septic shock.  Pertinent  Medical History  Cirrhosis PE, DVT on xarelto  Chronic benzo use HTN HBV GERD Bipolar disorder  Significant Hospital Events: Including procedures, antibiotic start and stop dates in addition to other pertinent events   6/17 admitted, started on antibiotics  Interim History / Subjective:  Overnight no acute events.  Afebrile. She denies complaints today.  Objective    Blood pressure (!) 93/56, pulse 77, temperature 98.2 F (36.8 C), temperature source Axillary, resp. rate 15, height 5' 4 (1.626 m), weight 103.9 kg, SpO2 93%.    FiO2 (%):  [32 %-100 %] 32 %   Intake/Output Summary (Last 24 hours) at 07/11/2023 0901 Last data filed at 07/11/2023 0800 Gross per 24 hour  Intake 5588.1 ml  Output 3860 ml  Net 1728.1 ml   Filed Weights   07/10/23 0933  Weight: 103.9 kg    Examination: General: chronically ill appearing woman sitting up in bed in NAD HENT: [place]/AT, eyes anicteric Lungs: breathing comfortably on [place], rhales bilaterally. No accessory muscle use. Cardiovascular: S1S2, RRR Abdomen: obese, soft, NT Extremities: + LE edema-- similar to yesterday's exam, no  cyanosis Neuro: awake but drowsy, mildly garbled speech. Falls back asleep when not stimulated. Oriented to person, place, situation  Na+ 137 BUN 16 Cr 1.48 WBC 11.4 H/H 8.5/28.6 Platelets 133  Blood cultures: NGTD No sputum culture Strep pneumoniae urinary antigen positive RVP & covid negative UDS: + benzodiazepine  Resolved problem list  Hyponatremia, resolved  Assessment and Plan   Fall on xarelto ; no major injuries found on trauma workup -appreciate trauma evaluation; taken out of C-collar -PT, OT; goal is up to chair today  Septic shock due to strep pneumoniae pneumonia-- associated encephalopathy, thrombocytopenia, and AKI Multilobar pneumonia; has history of Pseudomonas and cavitary pneumonias, so some of this could be scar Lactic acidosis due to sepsis -deescalate antibiotics to ceftriaxone  -norepi to maintain MAP >65 -add midodrine -no ascites on POCUS or CT exam at admission  Acute respiratory failure with hypoxia due to pneumonia H/o COPD; not acutely exacerbated -collect sputum culture if able -supplemental O2 to maintain SpO2 >90% -ceftriaxone  -con't Breo in place of Symbicort   Chronic benzodiazepine use -xanax  at half PTA dose; can increase when mentation is back to baseline  Acute encephalopathy; ammonia WNL. Worry about misuse of xanax . Other intoxication is also possible; ETOH undetectable.  H/o bipolar disorder -send out UDS pending -need to encourage appropriate use of OP prescribed meds -con't TPA doxepin   Cirrhosis, chronic HBV. 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 -switching her to apixaban due to chronic liver disease; copay assessment by pharmacy team says $0 copay -has allergy to heparin  (itching)  Renal cyst, enlarging -eventually needs MRI to further assess; recommend this as OP  Chronic back pain -oxycodone  PRN   CT with edema and fat stranding in lower abdomen wall-- no clinical correlate externally to suggest infection -monitor  Urinary retention at admission -foley placed; can trial removal tomorrow   Best Practice (right click and Reselect all SmartList Selections daily)   Diet/type: Regular consistency (see orders) DVT prophylaxis DOAC- discharge on apixaban with liver disease Pressure ulcer(s): per nursing assessment GI prophylaxis: N/A Lines: N/A Foley:  Yes, and it is still needed Code Status:  full code Last date of multidisciplinary goals of care discussion [  ]  Labs   CBC: Recent Labs  Lab 07/10/23 0929 07/10/23 0935 07/10/23 1013 07/11/23 0440  WBC 11.5*  --   --  11.4*  HGB 10.4* 11.6* 10.9* 8.5*  HCT 34.6* 34.0* 32.0* 28.6*  MCV 85.4  --   --  84.9  PLT 170  --   --  133*    Basic Metabolic Panel: Recent Labs  Lab 07/10/23 0929 07/10/23 0935 07/10/23 1013 07/11/23 0440  NA 134* 132* 133* 137  K 4.5 4.5 4.5 3.8  CL 100 98  --  106  CO2 21*  --   --  22  GLUCOSE 92 90  --  106*  BUN 19 22  --  16  CREATININE 2.14* 2.20*  --  1.48*  CALCIUM 9.4  --   --  8.2*  MG  --   --   --  1.6*  PHOS  --   --   --  3.2   GFR: Estimated Creatinine Clearance: 43.9 mL/min (A) (by C-G formula based on SCr of 1.48 mg/dL (H)). Recent Labs  Lab 07/10/23 0929 07/10/23 0936 07/10/23 1058 07/10/23 1224 07/11/23 0440  WBC 11.5*  --   --   --  11.4*  LATICACIDVEN  --  3.9* 3.6* 3.3*  --     Critical care time:     This patient is critically ill with multiple organ system  failure which requires frequent high complexity decision making, assessment, support, evaluation, and titration of therapies. This was completed through the application of advanced monitoring technologies and extensive interpretation of multiple databases. During this encounter critical care time was devoted to patient care services described in this note for 38 minutes.   Joesph Mussel, DO 07/11/23 9:50 AM [facility]  For contact information, see Amion. If no response to pager, please call PCCM consult pager. After hours, 7PM- 7AM, please call Elink.

## 2023-07-11 NOTE — TOC CAGE-AID Note (Signed)
 Transition of Care [facility]) - CAGE-AID Screening   Patient Details  Name: TAJE TONDREAU MRN: [id number] Date of Birth: August 27, 1955  [facility] of [facility]) CM/SW Contact:    Juliane Och, LCSW Phone Number: 07/11/2023, 11:30 AM   Clinical Narrative:  11:31 AM Patient declined CSW offer of substance use resources.  CAGE-AID Screening:    Have You Ever Felt You Ought to Cut Down on Your Drinking or Drug Use?: Yes Have People Annoyed You By Critizing Your Drinking Or Drug Use?: Yes Have You Felt Bad Or Guilty About Your Drinking Or Drug Use?: No Have You Ever Had a Drink or Used Drugs First Thing In The Morning to Steady Your Nerves or to Get Rid of a Hangover?: No CAGE-AID Score: 2  Substance Abuse Education Offered: Yes  Substance abuse interventions: Other (must comment) (Patient declined offer of substance use resources)

## 2023-07-11 NOTE — Plan of Care (Signed)
   Problem: Coping: Goal: Ability to adjust to condition or change in health will improve Outcome: Progressing   Problem: Fluid Volume: Goal: Ability to maintain a balanced intake and output will improve Outcome: Progressing

## 2023-07-11 NOTE — Progress Notes (Signed)
 [facility] ADULT ICU REPLACEMENT PROTOCOL   The patient does apply for the [facility] Adult ICU Electrolyte Replacment Protocol based on the criteria listed below:   1.Exclusion criteria: TCTS, ECMO, Dialysis, and Myasthenia Gravis patients 2. Is GFR >/= 30 ml/min? Yes.    Patient's GFR today is 39 3. Is SCr </= 2? Yes.   Patient's SCr is 1.48 mg/dL 4. Did SCr increase >/= 0.5 in 24 hours? No. 5.Pt's weight >40kg  Yes.   6. Abnormal electrolyte(s): Mg= 1.6  7. Electrolytes replaced per protocol 8.  Call MD STAT for K+ </= 2.5, Phos </= 1, or Mag </= 1 Physician:  Rito Chess, eMD   Jeffry Minister 07/11/2023 5:46 AM

## 2023-07-11 NOTE — TOC Progression Note (Addendum)
 Transition of Care [facility]) - Progression Note    Patient Details  Name: [name] MRN: [id number] Date of Birth: [birth year]  [facility]) CM/SW Contact  Juliane Och, LCSW Phone Number: 07/11/2023, 11:35 AM  Clinical Narrative:     11:35 AM CSW informed patient of physical therapy recommendation of patient discharging to SNF. Patient deferred decision to daughter, [name]. CSW attempted to call [facility] twice, but there was no response and a voicemail could not be left.  12:47 PM CSW spoke with [name] who stated that patient has access to multiple supports at home. [name] is to discuss SNF with patient.   Expected Discharge Plan: Skilled Nursing Facility Barriers to Discharge: Continued Medical Work up  Expected Discharge Plan and Services In-house Referral: Clinical Social Work     Living arrangements for the past 2 months: Single Family Home                                       Social Determinants of Health (SDOH) Interventions SDOH Screenings   Food Insecurity: No Food Insecurity (06/27/2023)   Received from [facility]  Recent Concern: Food Insecurity - Food Insecurity Present (04/16/2023)   Received from [facility]  Housing: Low Risk  (06/27/2023)   Received from [facility]  Transportation Needs: No Transportation Needs (06/27/2023)   Received from [facility]  Utilities: Not At Risk (06/27/2023)   Received from [facility]  Financial Resource Strain: Low Risk  (06/27/2023)   Received from [facility]  Physical Activity: Unknown (06/27/2023)   Received from [facility]  Social Connections: Socially Integrated (06/27/2023)   Received from [facility]  Stress: No Stress Concern Present (06/27/2023)   Received from [facility]  Tobacco Use: Medium Risk (07/10/2023)    Readmission Risk Interventions    08/24/2022   11:55 AM 06/26/2022   12:01 PM  Readmission Risk Prevention Plan  Transportation Screening Complete Complete  PCP or  Specialist Appt within 5-7 Days  Complete  Home Care Screening  Complete  Medication Review (RN CM)  Complete  HRI or Home Care Consult Complete   SW Recovery Care/Counseling Consult Complete

## 2023-07-12 DIAGNOSIS — R339 Retention of urine, unspecified: Secondary | ICD-10-CM

## 2023-07-12 DIAGNOSIS — D696 Thrombocytopenia, unspecified: Secondary | ICD-10-CM

## 2023-07-12 LAB — CBC
HCT: 26.2 % — ABNORMAL LOW (ref 36.0–46.0)
Hemoglobin: 8.1 g/dL — ABNORMAL LOW (ref 12.0–15.0)
MCH: 25.7 pg — ABNORMAL LOW (ref 26.0–34.0)
MCHC: 30.9 g/dL (ref 30.0–36.0)
MCV: 83.2 fL (ref 80.0–100.0)
Platelets: 113 10*3/uL — ABNORMAL LOW (ref 150–400)
RBC: 3.15 MIL/uL — ABNORMAL LOW (ref 3.87–5.11)
RDW: 15.7 % — ABNORMAL HIGH (ref 11.5–15.5)
WBC: 5.5 10*3/uL (ref 4.0–10.5)
nRBC: 0 % (ref 0.0–0.2)

## 2023-07-12 LAB — COMPREHENSIVE METABOLIC PANEL WITH GFR
ALT: 17 U/L (ref 0–44)
AST: 34 U/L (ref 15–41)
Albumin: 2.2 g/dL — ABNORMAL LOW (ref 3.5–5.0)
Alkaline Phosphatase: 54 U/L (ref 38–126)
Anion gap: 5 (ref 5–15)
BUN: 15 mg/dL (ref 8–23)
CO2: 24 mmol/L (ref 22–32)
Calcium: 8.2 mg/dL — ABNORMAL LOW (ref 8.9–10.3)
Chloride: 105 mmol/L (ref 98–111)
Creatinine, Ser: 1.3 mg/dL — ABNORMAL HIGH (ref 0.44–1.00)
GFR, Estimated: 45 mL/min — ABNORMAL LOW (ref >60)
Glucose, Bld: 74 mg/dL (ref 70–99)
Potassium: 4.2 mmol/L (ref 3.5–5.1)
Sodium: 134 mmol/L — ABNORMAL LOW (ref 135–145)
Total Bilirubin: 0.6 mg/dL (ref 0.0–1.2)
Total Protein: 5.1 g/dL — ABNORMAL LOW (ref 6.5–8.1)

## 2023-07-12 LAB — GLUCOSE, CAPILLARY
Glucose-Capillary: 76 mg/dL (ref 70–99)
Glucose-Capillary: 80 mg/dL (ref 70–99)
Glucose-Capillary: 90 mg/dL (ref 70–99)
Glucose-Capillary: 84 mg/dL (ref 70–99)
Glucose-Capillary: 131 mg/dL — ABNORMAL HIGH (ref 70–99)
Glucose-Capillary: 107 mg/dL — ABNORMAL HIGH (ref 70–99)

## 2023-07-12 LAB — MAGNESIUM: Magnesium: 1.9 mg/dL (ref 1.7–2.4)

## 2023-07-12 MED ORDER — INSULIN ASPART 100 UNIT/ML IJ SOLN: 0.0000 [IU] | Freq: Three times a day (TID) | INTRAMUSCULAR | Status: DC

## 2023-07-12 MED ORDER — TAMSULOSIN HCL 0.4 MG PO CAPS
0.4000 mg | ORAL_CAPSULE | Freq: Every day | ORAL | Status: DC
  Administered 2023-07-12 – 2023-07-17 (×6): 0.4 mg via ORAL
  Filled 2023-07-12 (×6): qty 1

## 2023-07-12 MED ORDER — GABAPENTIN 300 MG PO CAPS
300.0000 mg | ORAL_CAPSULE | Freq: Three times a day (TID) | ORAL | Status: DC
  Administered 2023-07-12 – 2023-07-17 (×16): 300 mg via ORAL
  Filled 2023-07-12 (×16): qty 1

## 2023-07-12 MED ORDER — GUAIFENESIN ER 600 MG PO TB12
600.0000 mg | ORAL_TABLET | Freq: Two times a day (BID) | ORAL | Status: DC
  Administered 2023-07-12 – 2023-07-17 (×11): 600 mg via ORAL
  Filled 2023-07-12 (×11): qty 1

## 2023-07-12 MED ORDER — DIVALPROEX SODIUM ER 500 MG PO TB24
500.0000 mg | ORAL_TABLET | Freq: Every day | ORAL | Status: DC
  Administered 2023-07-12 – 2023-07-17 (×6): 500 mg via ORAL
  Filled 2023-07-12 (×6): qty 1

## 2023-07-12 MED ORDER — ALPRAZOLAM 0.5 MG PO TABS
1.0000 mg | ORAL_TABLET | Freq: Three times a day (TID) | ORAL | Status: DC | PRN
  Administered 2023-07-12 – 2023-07-16 (×7): 1 mg via ORAL
  Filled 2023-07-12 (×9): qty 2

## 2023-07-12 MED ORDER — TENOFOVIR ALAFENAMIDE FUMARATE 25 MG PO TABS
25.0000 mg | ORAL_TABLET | Freq: Every day | ORAL | Status: DC
  Administered 2023-07-12 – 2023-07-17 (×6): 25 mg via ORAL
  Filled 2023-07-12 (×7): qty 1

## 2023-07-12 MED ORDER — FLAVOXATE HCL 100 MG PO TABS
100.0000 mg | ORAL_TABLET | Freq: Three times a day (TID) | ORAL | Status: DC | PRN
  Administered 2023-07-12: 100 mg via ORAL
  Filled 2023-07-12 (×2): qty 1

## 2023-07-12 NOTE — Progress Notes (Signed)
 Occupational Therapy Treatment Patient Details Name: [name] MRN: [id number] DOB: [birth year] Today's Date: 07/12/2023   History of present illness 68 y/o woman with a history of PE on Xarelto  who presented with fall at home hitting her head, on xarelto . She had taken 2-3 Xanax  this morning. With EMS she had concern for aspiration and dehydration. In the ED she was hypotensive, given IVF with ongoing MAPs in the 40s requiring vasopressors to be started peripherally, transferred to ICU for treatment of septic shock, multilobar PNA, acute respiratory failure, acute encephalopathy, AKI on CKD 2PMH: PE, DVT on Xarelto , cirrhosis, chronic benzo use, HTN, HBV, GERD, Bipolar disorder, peripheral neuropathy   OT comments  Patient alert and agreeable to OT treatment. Patient tangential with mild difficult with word finding. Patient required frequent cues for safety due to impulsive with getting to EOB. Patient demonstrating good with bed mobility with mod assist and transfers to recliner. Patient able to perform grooming tasks while seated in recliner with setup. Patient will benefit from continued inpatient follow up therapy, <3 hours/day. Acute OT to continue to follow to address established goals to facilitate DC to next venue of care.        If plan is discharge home, recommend the following:  A lot of help with bathing/dressing/bathroom;A lot of help with walking and/or transfers;Direct supervision/assist for medications management;Direct supervision/assist for financial management;Assist for transportation;Help with stairs or ramp for entrance;Assistance with cooking/housework   Equipment Recommendations  Other (comment) (defer)    Recommendations for Other Services      Precautions / Restrictions Precautions Precautions: Fall Restrictions Weight Bearing Restrictions Per Provider Order: No       Mobility Bed Mobility Overal bed mobility: Needs Assistance Bed Mobility: Supine to Sit      Supine to sit: HOB elevated, Used rails, Mod assist     General bed mobility comments: impulsive with verbal cues for safety with assistance for trunk and scooting towards EOB    Transfers Overall transfer level: Needs assistance Equipment used: Rolling walker (2 wheels) Transfers: Sit to/from Stand, Bed to chair/wheelchair/BSC Sit to Stand: Mod assist     Step pivot transfers: Mod assist     General transfer comment: mod assist to power up from EOB and to steady during transfer with assistance to manage RW     Balance Overall balance assessment: Needs assistance Sitting-balance support: Bilateral upper extremity supported, Single extremity supported Sitting balance-Leahy Scale: Fair     Standing balance support: Bilateral upper extremity supported, During functional activity Standing balance-Leahy Scale: Poor Standing balance comment: requires RW for support                           ADL either performed or assessed with clinical judgement   ADL Overall ADL's : Needs assistance/impaired Eating/Feeding: Set up;Sitting   Grooming: Wash/dry hands;Wash/dry face;Brushing hair;Set up;Sitting           Upper Body Dressing : Minimal assistance;Sitting   Lower Body Dressing: Maximal assistance;Sitting/lateral leans Lower Body Dressing Details (indicate cue type and reason): donn socks Toilet Transfer: Moderate assistance;Rolling walker (2 wheels) Toilet Transfer Details (indicate cue type and reason): simulated to recliner         Functional mobility during ADLs: Moderate assistance;Rolling walker (2 wheels)      Extremity/Trunk Assessment Upper Extremity Assessment Upper Extremity Assessment: Generalized weakness            Vision  Perception     Praxis     Communication Communication Communication: Impaired Factors Affecting Communication: Reduced clarity of speech   Cognition Arousal: Alert Behavior During Therapy:  Impulsive Cognition: Cognition impaired   Orientation impairments: Time, Situation Awareness: Intellectual awareness impaired, Online awareness impaired Memory impairment (select all impairments): Short-term memory Attention impairment (select first level of impairment): Focused attention   OT - Cognition Comments: patient tangential, required cues for safety                 Following commands: Impaired Following commands impaired: Follows one step commands inconsistently, Follows one step commands with increased time      Cueing   Cueing Techniques: Verbal cues, Gestural cues, Tactile cues, Visual cues  Exercises      Shoulder Instructions       General Comments patient impulsive requiring frequent cues for safety    Pertinent Vitals/ Pain       Pain Assessment Pain Assessment: Faces Faces Pain Scale: Hurts little more Pain Location: left shoulder and BLE knees Pain Descriptors / Indicators: Sharp, Shooting, Grimacing, Guarding Pain Intervention(s): Limited activity within patient's tolerance, Monitored during session, Premedicated before session, Repositioned  Home Living                                          Prior Functioning/Environment              Frequency  Min 2X/week        Progress Toward Goals  OT Goals(current goals can now be found in the care plan section)  Progress towards OT goals: Progressing toward goals  Acute Rehab OT Goals Patient Stated Goal: to eat a brownie OT Goal Formulation: With patient Time For Goal Achievement: 07/25/23 Potential to Achieve Goals: Good ADL Goals Pt Will Perform Upper Body Dressing: with contact guard assist;sitting Pt Will Perform Lower Body Dressing: with contact guard assist;sit to/from stand;sitting/lateral leans Pt Will Transfer to Toilet: with contact guard assist;ambulating;regular height toilet Pt Will Perform Tub/Shower Transfer: with contact guard assist;ambulating  Plan       Co-evaluation                 AM-PAC OT 6 Clicks Daily Activity     Outcome Measure   Help from another person eating meals?: A Little Help from another person taking care of personal grooming?: A Little Help from another person toileting, which includes using toliet, bedpan, or urinal?: A Lot Help from another person bathing (including washing, rinsing, drying)?: A Lot Help from another person to put on and taking off regular upper body clothing?: A Little Help from another person to put on and taking off regular lower body clothing?: A Lot 6 Click Score: 15    End of Session Equipment Utilized During Treatment: Gait belt;Rolling walker (2 wheels);Oxygen   OT Visit Diagnosis: Unsteadiness on feet (R26.81);Other abnormalities of gait and mobility (R26.89);Muscle weakness (generalized) (M62.81)   Activity Tolerance Patient tolerated treatment well   Patient Left in chair;with call bell/phone within reach;with chair alarm set   Nurse Communication Mobility status        Time: 6295-2841 OT Time Calculation (min): 29 min  Charges: OT General Charges $OT Visit: 1 Visit OT Treatments $Self Care/Home Management : 8-22 mins $Therapeutic Activity: 8-22 mins  Anitra Barn, OTA Acute Rehabilitation Services  Office [phone]   Jovita Nipper 07/12/2023, 12:04  PM

## 2023-07-12 NOTE — TOC Progression Note (Signed)
 Transition of Care [facility]) - Progression Note    Patient Details  Name: [name] MRN: [id number] Date of Birth: 18-Jun-1955  Transition of [facility]) CM/SW Contact  Juliane Och, LCSW Phone Number: 07/12/2023, 11:40 AM  Clinical Narrative:     11:40 AM CSW attempted to call patient's daughter, [name], regarding SNF. Three attempts were made. There were no responses and voicemail's could not be left. CSW followed up with patient who expressed continued interest in discussing SNF with [facility] prior to deciding whether or not to discharge there vs home with [facility].  Expected Discharge Plan: Skilled Nursing Facility Barriers to Discharge: Continued Medical Work up  Expected Discharge Plan and Services In-house Referral: Clinical Social Work     Living arrangements for the past 2 months: Single Family Home                                       Social Determinants of Health (SDOH) Interventions SDOH Screenings   Food Insecurity: No Food Insecurity (06/27/2023)   Received from [facility]  Recent Concern: Food Insecurity - Food Insecurity Present (04/16/2023)   Received from [facility]  Housing: Low Risk  (06/27/2023)   Received from [facility]  Transportation Needs: No Transportation Needs (06/27/2023)   Received from [facility]  Utilities: Not At Risk (06/27/2023)   Received from [facility]  Financial Resource Strain: Low Risk  (06/27/2023)   Received from [facility]  Physical Activity: Unknown (06/27/2023)   Received from [facility]  Social Connections: Socially Integrated (06/27/2023)   Received from [facility]  Stress: No Stress Concern Present (06/27/2023)   Received from [facility]  Tobacco Use: Medium Risk (07/10/2023)    Readmission Risk Interventions    08/24/2022   11:55 AM 06/26/2022   12:01 PM  Readmission Risk Prevention Plan  Transportation Screening Complete Complete  PCP or Specialist Appt within 5-7 Days  Complete  Home Care Screening   Complete  Medication Review (RN CM)  Complete  HRI or Home Care Consult Complete   SW Recovery Care/Counseling Consult Complete

## 2023-07-12 NOTE — Progress Notes (Signed)
 Foley placed after 3 attempts of I and o over 640 cc bladder scan.placed foley for acute urinary retension

## 2023-07-12 NOTE — Progress Notes (Signed)
 Urinary retention again  -nurse to I/O cath, starting floma  Joesph Mussel, DO 07/12/23 5:54 PM [facility]  For contact information, see Amion. If no response to pager, please call PCCM consult pager. After hours, 7PM- 7AM, please call Elink.

## 2023-07-12 NOTE — Progress Notes (Signed)
 NAME:  [name], MRN:  [id number], DOB:  January 05, 1956, LOS: 2 ADMISSION DATE:  07/10/2023, CONSULTATION DATE:  6/17 REFERRING MD:  Aleene Amor, CHIEF COMPLAINT:  septic shock   History of Present Illness:  [name] is a 68 y/o woman with a history of PE on Xarelto  who presented with fall at home hitting her head, on xarelto . She had taken 2-3 Xanax  this morning. With EMS she had concern for aspiration and dehydration. In the ED she was hypotensive, given IVF with ongoing MAPs in the 40s requiring vasopressors to be started peripherally. She says yes to all questions-- asking about fevers, cough, vomiting, diarrhea. No family at bedside to corroborate history. Blood cultures were collected and she was started on unasyn  for possible aspiration pneumonia. PCCM consulted for septic shock.  Pertinent  Medical History  Cirrhosis PE, DVT on xarelto  Chronic benzo use HTN HBV GERD Bipolar disorder  Significant Hospital Events: Including procedures, antibiotic start and stop dates in addition to other pertinent events   6/17 admitted, started on antibiotics  Interim History / Subjective:  This morning she endorses ongoing cough, sometimes productive Afebrile overnight. Had episodic tachycardia up to 140s overnight. EKG was done.   Objective    Blood pressure (!) 91/48, pulse 93, temperature 99.1 F (37.3 C), temperature source Oral, resp. rate 18, height 5' 4 (1.626 m), weight 103.9 kg, SpO2 97%.        Intake/Output Summary (Last 24 hours) at 07/12/2023 0909 Last data filed at 07/12/2023 0100 Gross per 24 hour  Intake 475.01 ml  Output 1885 ml  Net -1409.99 ml   Filed Weights   07/10/23 0933  Weight: 103.9 kg    Examination: General: chronically ill appearing woman lying in bed in NAD HENT: [place]/AT, eyes anicteric Lungs: Breathing comfortably on nasal cannula, rhonchi on the right.  Vigorous cough. Cardiovascular: S1-S2, regular rate and rhythm Abdomen: obese, soft,  NT Extremities: similar mild LE edema Neuro: more awake today, answering questions appropriately.  EKG personally reviewed>NSR, normal axis and intervals. TWI in AVL only. Early R wave progression across precordials.   Telemetry personally reviewed> short runs of Afib  Na+ 134 BUN 15 Cr 1.3 WBC 5.5 H/H 8.1/26.2 Platelets 113  Blood cultures: No growth to date No sputum culture Strep pneumoniae urinary antigen positive RVP & covid negative UDS: + benzodiazepine  Resolved problem list  Hyponatremia, resolved  Assessment and Plan   Fall on xarelto ; no major injuries found on trauma workup -appreciate trauma evaluation; taken out of C-collar -PT, OT  Septic shock due to strep pneumoniae pneumonia-- associated encephalopathy, thrombocytopenia, and AKI.  Shock resolved. Multilobar pneumonia; has history of Pseudomonas and cavitary pneumonias, so some of this could be scar Lactic acidosis due to sepsis -Continue ceftriaxone  to complete 7 days - Off norepinephrine  since last night - Continue midodrine -no ascites on POCUS or CT exam at admission  Acute respiratory failure with hypoxia due to pneumonia H/o COPD; not acutely exacerbated -wean O2 to maintain SpO2 >88% -ceftriaxone  x 7 days total -Breo, resume symbicort  at discharge -add mucinex   Chronic benzodiazepine use -con't PTA xanax   Acute encephalopathy; ammonia WNL. Worry about misuse of xanax . Other intoxication is also possible; ETOH undetectable.  H/o bipolar disorder -send out UDS pending from admission -need to encourage appropriate use of meds as OP; family may need to help supervise this -con't TPA doxepin   Cirrhosis, chronic HBV. MELD-Na+ = 26 (artificially increased INR due to DOAC) -avoid hepatically cleared meds -  Restart PTA tenofovir ; recently been on taking Entecavir  but no longer as an outpatient.  Will stop.  Chronic anemia; drop initially most likely related to dilution, but cannot rule out  delayed bleeding after fall. Had dropped ~2g in the past few months before admission. -no signs of bleeding on exam, monitor -daily CBC -if Hb continues to drop would have to stop [facility] -transfuse for Hb <7 or hemodynamically significant bleeding -Check FOBT -Check iron, TIBC, ferritin tomorrow.  Suspect ferritin will be artificially high with infection, but may help with decision regarding need for iron supplementation. She was on this at home.   New onset paroxysmal Afib-- short runs on tele -already on DOAC for PE history -can resume PTA coreg  when BP allows  AKI on CKD 2; renal atrophy on CT -strict I/O -renally dose meds, avoid nephrtoxic meds  Chronic AC due to h/o DVT & PE -switching her to apixaban due to chronic liver disease; copay assessment by pharmacy team says $0 copay -has allergy to heparin  (itching)  Renal cyst, enlarging -eventually needs MRI to further assess; recommend this as OP  Chronic back pain Chronic neuropathic pain -oxycodone  as needed -Resume PTA dosing of gabapentin  now that renal function has improved  CT with edema and fat stranding in lower abdomen wall-- no clinical correlate externally to suggest infection -monitor  Urinary retention at admission -foley d/c; if still retaining can start flomax  Stable to transfer out of ICU today. TRY to assume care tomorrow. PCCM will be available as needed.   Best Practice (right click and Reselect all SmartList Selections daily)   Diet/type: Regular consistency (see orders) DVT prophylaxis DOAC- discharge on apixaban with liver disease Pressure ulcer(s): per nursing assessment GI prophylaxis: N/A Lines: N/A Foley:  Yes, and it is still needed Code Status:  full code Last date of multidisciplinary goals of care discussion [  ]  Labs   CBC: Recent Labs  Lab 07/10/23 0929 07/10/23 0935 07/10/23 1013 07/11/23 0440 07/12/23 0427  WBC 11.5*  --   --  11.4* 5.5  HGB 10.4* 11.6* 10.9* 8.5* 8.1*   HCT 34.6* 34.0* 32.0* 28.6* 26.2*  MCV 85.4  --   --  84.9 83.2  PLT 170  --   --  133* 113*    Basic Metabolic Panel: Recent Labs  Lab 07/10/23 0929 07/10/23 0935 07/10/23 1013 07/11/23 0440 07/12/23 0427  NA 134* 132* 133* 137 134*  K 4.5 4.5 4.5 3.8 4.2  CL 100 98  --  106 105  CO2 21*  --   --  22 24  GLUCOSE 92 90  --  106* 74  BUN 19 22  --  16 15  CREATININE 2.14* 2.20*  --  1.48* 1.30*  CALCIUM 9.4  --   --  8.2* 8.2*  MG  --   --   --  1.6* 1.9  PHOS  --   --   --  3.2  --    GFR: Estimated Creatinine Clearance: 50 mL/min (A) (by C-G formula based on SCr of 1.3 mg/dL (H)). Recent Labs  Lab 07/10/23 0929 07/10/23 0936 07/10/23 1058 07/10/23 1224 07/11/23 0440 07/12/23 0427  WBC 11.5*  --   --   --  11.4* 5.5  LATICACIDVEN  --  3.9* 3.6* 3.3*  --   --     Critical care time:       Joesph Mussel, DO 07/12/23 9:09 AM [facility]  For  contact information, see Amion. If no response to pager, please call PCCM consult pager. After hours, 7PM- 7AM, please call Elink.

## 2023-07-13 DIAGNOSIS — R6521 Severe sepsis with septic shock: Secondary | ICD-10-CM | POA: Diagnosis not present

## 2023-07-13 DIAGNOSIS — A419 Sepsis, unspecified organism: Secondary | ICD-10-CM

## 2023-07-13 LAB — CBC
HCT: 26.7 % — ABNORMAL LOW (ref 36.0–46.0)
Hemoglobin: 8.2 g/dL — ABNORMAL LOW (ref 12.0–15.0)
MCH: 25.6 pg — ABNORMAL LOW (ref 26.0–34.0)
MCHC: 30.7 g/dL (ref 30.0–36.0)
MCV: 83.4 fL (ref 80.0–100.0)
Platelets: 111 10*3/uL — ABNORMAL LOW (ref 150–400)
RBC: 3.2 MIL/uL — ABNORMAL LOW (ref 3.87–5.11)
RDW: 15.9 % — ABNORMAL HIGH (ref 11.5–15.5)
WBC: 3.5 10*3/uL — ABNORMAL LOW (ref 4.0–10.5)
nRBC: 0 % (ref 0.0–0.2)

## 2023-07-13 LAB — GLUCOSE, CAPILLARY
Glucose-Capillary: 89 mg/dL (ref 70–99)
Glucose-Capillary: 44 mg/dL — CL (ref 70–99)
Glucose-Capillary: 52 mg/dL — ABNORMAL LOW (ref 70–99)
Glucose-Capillary: 57 mg/dL — ABNORMAL LOW (ref 70–99)
Glucose-Capillary: 69 mg/dL — ABNORMAL LOW (ref 70–99)
Glucose-Capillary: 92 mg/dL (ref 70–99)
Glucose-Capillary: 113 mg/dL — ABNORMAL HIGH (ref 70–99)
Glucose-Capillary: 100 mg/dL — ABNORMAL HIGH (ref 70–99)

## 2023-07-13 LAB — COMPREHENSIVE METABOLIC PANEL WITH GFR
ALT: 16 U/L (ref 0–44)
AST: 23 U/L (ref 15–41)
Albumin: 2.2 g/dL — ABNORMAL LOW (ref 3.5–5.0)
Alkaline Phosphatase: 57 U/L (ref 38–126)
Anion gap: 6 (ref 5–15)
BUN: 10 mg/dL (ref 8–23)
CO2: 27 mmol/L (ref 22–32)
Calcium: 8 mg/dL — ABNORMAL LOW (ref 8.9–10.3)
Chloride: 104 mmol/L (ref 98–111)
Creatinine, Ser: 1.12 mg/dL — ABNORMAL HIGH (ref 0.44–1.00)
GFR, Estimated: 54 mL/min — ABNORMAL LOW (ref >60)
Glucose, Bld: 75 mg/dL (ref 70–99)
Potassium: 4.2 mmol/L (ref 3.5–5.1)
Sodium: 137 mmol/L (ref 135–145)
Total Bilirubin: 0.6 mg/dL (ref 0.0–1.2)
Total Protein: 5.2 g/dL — ABNORMAL LOW (ref 6.5–8.1)

## 2023-07-13 LAB — IRON AND TIBC
Iron: 26 ug/dL — ABNORMAL LOW (ref 28–170)
Saturation Ratios: 10 % — ABNORMAL LOW (ref 10.4–31.8)
TIBC: 267 ug/dL (ref 250–450)
UIBC: 241 ug/dL

## 2023-07-13 LAB — FERRITIN: Ferritin: 38 ng/mL (ref 11–307)

## 2023-07-13 MED ORDER — ALBUTEROL SULFATE (2.5 MG/3ML) 0.083% IN NEBU
2.5000 mg | INHALATION_SOLUTION | Freq: Two times a day (BID) | RESPIRATORY_TRACT | Status: DC
  Administered 2023-07-13 – 2023-07-17 (×7): 2.5 mg via RESPIRATORY_TRACT
  Filled 2023-07-13 (×8): qty 3

## 2023-07-13 MED ORDER — INSULIN ASPART 100 UNIT/ML IJ SOLN
0.0000 [IU] | Freq: Three times a day (TID) | INTRAMUSCULAR | Status: DC
  Administered 2023-07-15: 1 [IU] via SUBCUTANEOUS

## 2023-07-13 MED ORDER — INSULIN ASPART 100 UNIT/ML IJ SOLN: 0.0000 [IU] | Freq: Every day | INTRAMUSCULAR | Status: DC

## 2023-07-13 NOTE — Progress Notes (Signed)
 PROGRESS NOTE    [name]  GMW:[id number] DOB: 1955-06-21 DOA: 07/10/2023 PCP: Authur Leghorn, MD   Brief Narrative:  [name] is a 68 y/o woman with a history of PE on Xarelto  who presented with fall at home hitting her head, on xarelto . She had taken 2-3 Xanax  this morning. With EMS she had concern for aspiration and dehydration. In the ED she was hypotensive, given IVF with ongoing MAPs in the 40s requiring vasopressors to be started peripherally. She says yes to all questions-- asking about fevers, cough, vomiting, diarrhea. No family at bedside to corroborate history. Blood cultures were collected and she was started on unasyn  for possible aspiration pneumonia. PCCM consulted for septic shock.  Patient continued to improve after admission, off pressors as of 07/12/2023, transferred to [facility]/hospitalist group 6/20   Assessment & Plan:   Principal Problem:   Septic shock (HCC)  Septic shock due to strep pneumoniae pneumonia, POA -With secondary lactic acidosis, encephalopathy, hypoxia, AKI, and thrombocytopenia  -Shock resolved with supportive care/antibiotics -Cultures remain negative - Continue antibiotics(ceftriaxone ) plan for 7-day course given severity of illness - Nor epi off 6/18 -wean midodrine as appropriate  Acute respiratory failure with hypoxia due to pneumonia H/o COPD; not acutely exacerbated -wean O2 to maintain SpO2 >88% -ceftriaxone  x 7 days total -Breo, resume symbicort  at discharge -Continue mucinex   Acute encephalopathy Likely metabolic  Cannot rule out toxic component History of bipolar disorder - Improving, follow-up with family, unclear if patient is back to baseline - Likely secondary to septic shock, AKI - Chronic Xanax  use, benzo positive UDS, unclear if overuse/overdose - Continue Depakote  Acute thrombocytopenia - Likely acute phase secondary to sepsis, stabilizing around 110  AKI on CKD 2 -Creatinine downtrending with supportive  care now off pressors - Urine output appropriate Shock resolved.   Cirrhosis, chronic HBV - MELD-Na+ = 26 (artificially increased INR due to DOAC) - Avoid hepatically cleared meds - Restart PTA tenofovir ; recently been on taking Entecavir  but no longer as an outpatient.   - Discussed with pharmacy, given antibody negative testing would likely qualify for immunization - follow-up with her liver specialty team at [facility].   Acute on chronic anemia of iron deficiency - No signs or symptoms of bleeding - Stabilizing around 8 - Initial drop likely hemodilutional - Iron deficiency noted with normal ferritin despite acute infection - FOBT pending  New onset paroxysmal Afib -Already on DOAC for PE history -Continue to hold carvedilol  given borderline hypotension/bradycardia -Switching her to apixaban due to chronic liver disease -reports allergy to heparin  (itching)   Fall on xarelto ; no major injuries found on trauma workup -appreciate trauma evaluation; taken out of C-collar -PT, OT  Renal cyst, enlarging - Follow repeat imaging outpatient with MRI per radiology recommendations   Chronic back pain Chronic neuropathic pain -oxycodone  as needed - Continue gabapentin   CT with edema and fat stranding in lower abdomen wall- -no clear etiology, remains asymptomatic, follow clinically   Urinary retention at admission -foley d/c; if still retaining can start flomax    DVT prophylaxis: SCDs Start: 07/10/23 1154 apixaban (ELIQUIS) tablet 5 mg  Code Status:   Code Status: Full Code Family Communication: None present  Status is: Inpatient  Dispo: The patient is from: Home              Anticipated d/c is to: To be determined, currently qualifies for SNF              Anticipated d/c  date is: 78-72h              Patient currently not medically stable for discharge  Consultants:  PCCM  Procedures:  None  Antimicrobials:  Ceftriaxone   Subjective: No acute issues or events  overnight, review of systems somewhat limited by patient's mental status but denies chest pain nausea vomiting diarrhea constipation headache fevers or shortness of breath  Objective: Vitals:   07/13/23 0100 07/13/23 0200 07/13/23 0245 07/13/23 0600  BP:   109/71   Pulse:   78   Resp: 18 18 18    Temp:   98 F (36.7 C)   TempSrc:   Oral   SpO2:   96%   Weight:    111.9 kg  Height:        Intake/Output Summary (Last 24 hours) at 07/13/2023 0738 Last data filed at 07/13/2023 0600 Gross per 24 hour  Intake 580 ml  Output 3200 ml  Net -2620 ml   Filed Weights   07/10/23 0933 07/13/23 0600  Weight: 103.9 kg 111.9 kg    Examination:  General:  Pleasantly resting in bed, No acute distress.  Alert to person and place only HEENT:  Normocephalic atraumatic.  Sclerae nonicteric, noninjected.  Extraocular movements intact bilaterally. Neck:  Without mass or deformity.  Trachea is midline. Lungs:  Clear to auscultate bilaterally without rhonchi, wheeze, or rales. Heart:  Regular rate and rhythm.  Without murmurs, rubs, or gallops. Abdomen:  Soft, nontender, nondistended.  Without guarding or rebound. Extremities: Without cyanosis, clubbing, edema, or obvious deformity. Skin:  Warm and dry, no erythema.  Data Reviewed: I have personally reviewed following labs and imaging studies  CBC: Recent Labs  Lab 07/10/23 0929 07/10/23 0935 07/10/23 1013 07/11/23 0440 07/12/23 0427  WBC 11.5*  --   --  11.4* 5.5  HGB 10.4* 11.6* 10.9* 8.5* 8.1*  HCT 34.6* 34.0* 32.0* 28.6* 26.2*  MCV 85.4  --   --  84.9 83.2  PLT 170  --   --  133* 113*   Basic Metabolic Panel: Recent Labs  Lab 07/10/23 0929 07/10/23 0935 07/10/23 1013 07/11/23 0440 07/12/23 0427  NA 134* 132* 133* 137 134*  K 4.5 4.5 4.5 3.8 4.2  CL 100 98  --  106 105  CO2 21*  --   --  22 24  GLUCOSE 92 90  --  106* 74  BUN 19 22  --  16 15  CREATININE 2.14* 2.20*  --  1.48* 1.30*  CALCIUM 9.4  --   --  8.2* 8.2*  MG  --    --   --  1.6* 1.9  PHOS  --   --   --  3.2  --    GFR: Estimated Creatinine Clearance: 52.1 mL/min (A) (by C-G formula based on SCr of 1.3 mg/dL (H)). Liver Function Tests: Recent Labs  Lab 07/10/23 0929 07/11/23 0440 07/12/23 0427  AST 23 35 34  ALT 14 16 17   ALKPHOS 79 59 54  BILITOT 0.7 0.7 0.6  PROT 6.2* 5.4* 5.1*  ALBUMIN 2.9* 2.3* 2.2*   No results for input(s): LIPASE, AMYLASE in the last 168 hours. Recent Labs  Lab 07/10/23 1007  AMMONIA <13   Coagulation Profile: Recent Labs  Lab 07/10/23 0929  INR 2.3*   HbA1C: Recent Labs    07/10/23 1454  HGBA1C 5.6   CBG: Recent Labs  Lab 07/12/23 0727 07/12/23 1118 07/12/23 1603 07/12/23 2103 07/12/23 2240  GLUCAP 80 90 84  131* 107*   Sepsis Labs: Recent Labs  Lab 07/10/23 0936 07/10/23 1058 07/10/23 1224  LATICACIDVEN 3.9* 3.6* 3.3*    Recent Results (from the past 240 hours)  Blood culture (routine x 2)     Status: None (Preliminary result)   Collection Time: 07/10/23 10:40 AM   Specimen: BLOOD RIGHT WRIST  Result Value Ref Range Status   Specimen Description BLOOD RIGHT WRIST  Final   Special Requests   Final    BOTTLES DRAWN AEROBIC AND ANAEROBIC Blood Culture results may not be optimal due to an inadequate volume of blood received in culture bottles   Culture   Final    NO GROWTH 2 DAYS Performed at [facility], [address], [place], [place] [address]    Report Status PENDING  Incomplete  Respiratory (~20 pathogens) panel by PCR     Status: None   Collection Time: 07/10/23 11:39 AM   Specimen: Nasopharyngeal Swab; Respiratory  Result Value Ref Range Status   Adenovirus NOT DETECTED NOT DETECTED Final   Coronavirus 229E NOT DETECTED NOT DETECTED Final    Comment: (NOTE) The Coronavirus on the Respiratory Panel, DOES NOT test for the novel  Coronavirus (2019 nCoV)    Coronavirus HKU1 NOT DETECTED NOT DETECTED Final   Coronavirus NL63 NOT DETECTED NOT DETECTED Final    Coronavirus OC43 NOT DETECTED NOT DETECTED Final   Metapneumovirus NOT DETECTED NOT DETECTED Final   Rhinovirus / Enterovirus NOT DETECTED NOT DETECTED Final   Influenza A NOT DETECTED NOT DETECTED Final   Influenza B NOT DETECTED NOT DETECTED Final   Parainfluenza Virus 1 NOT DETECTED NOT DETECTED Final   Parainfluenza Virus 2 NOT DETECTED NOT DETECTED Final   Parainfluenza Virus 3 NOT DETECTED NOT DETECTED Final   Parainfluenza Virus 4 NOT DETECTED NOT DETECTED Final   Respiratory Syncytial Virus NOT DETECTED NOT DETECTED Final   Bordetella pertussis NOT DETECTED NOT DETECTED Final   Bordetella Parapertussis NOT DETECTED NOT DETECTED Final   Chlamydophila pneumoniae NOT DETECTED NOT DETECTED Final   Mycoplasma pneumoniae NOT DETECTED NOT DETECTED Final    Comment: Performed at [facility], [address] [place], [place] [address]  SARS Coronavirus 2 by RT PCR (hospital order, performed in [facility] hospital lab) *cepheid single result test*     Status: None   Collection Time: 07/10/23 11:39 AM  Result Value Ref Range Status   SARS Coronavirus 2 by RT PCR NEGATIVE NEGATIVE Final    Comment: Performed at [facility], [address], [place], [place] [address]  MRSA Next Gen by PCR, Nasal     Status: None   Collection Time: 07/10/23 12:05 PM  Result Value Ref Range Status   MRSA by PCR Next Gen NOT DETECTED NOT DETECTED Final    Comment: (NOTE) The GeneXpert MRSA Assay (FDA approved for NASAL specimens only), is one component of a comprehensive MRSA colonization surveillance program. It is not intended to diagnose MRSA infection nor to guide or monitor treatment for MRSA infections. Test performance is not FDA approved in patients less than 15 years old. Performed at [facility], [address], [place], [place] [address]   Culture, blood (single)     Status: None (Preliminary result)   Collection Time: 07/10/23  2:52 PM   Specimen: BLOOD  Result Value Ref  Range Status   Specimen Description BLOOD SITE NOT SPECIFIED  Final   Special Requests   Final  BOTTLES DRAWN AEROBIC AND ANAEROBIC Blood Culture adequate volume   Culture   Final    NO GROWTH 2 DAYS Performed at [facility], [address] [place], [place] [address]    Report Status PENDING  Incomplete  Blood culture (routine x 2)     Status: None (Preliminary result)   Collection Time: 07/10/23  2:54 PM   Specimen: BLOOD  Result Value Ref Range Status   Specimen Description BLOOD SITE NOT SPECIFIED  Final   Special Requests   Final    BOTTLES DRAWN AEROBIC AND ANAEROBIC Blood Culture adequate volume   Culture   Final    NO GROWTH 2 DAYS Performed at [facility], [address], [place], [place] [address]    Report Status PENDING  Incomplete    Radiology Studies: No results found.  Scheduled Meds:  albuterol   2.5 mg Nebulization Q4H   apixaban  5 mg Oral BID   Chlorhexidine  Gluconate Cloth  6 each Topical Daily   cholecalciferol   2,000 Units Oral Daily   divalproex  500 mg Oral Daily   doxepin   100 mg Oral QHS   famotidine   20 mg Oral BID   ferrous sulfate  325 mg Oral Daily   fluticasone  furoate-vilanterol  1 puff Inhalation Daily   gabapentin   300 mg Oral TID   guaiFENesin   600 mg Oral BID   insulin  aspart  0-9 Units Subcutaneous TID PC & HS   midodrine  10 mg Oral Q8H   montelukast   10 mg Oral Daily   mouth rinse  15 mL Mouth Rinse 4 times per day   pantoprazole   40 mg Oral BID AC   tamsulosin  0.4 mg Oral Daily   tenofovir  alafenamide  25 mg Oral Daily   Continuous Infusions:  cefTRIAXone  (ROCEPHIN )  IV Stopped (07/12/23 1025)     LOS: 3 days   Time spent:  Haydee Lipa, DO [facility]  If 7PM-7AM, please contact night-coverage [email or web address]  07/13/2023, 7:38 AM

## 2023-07-13 NOTE — Progress Notes (Signed)
 Physical Therapy Treatment Patient Details Name: [name] MRN: [id number] DOB: 1955-05-29 Today's Date: 07/13/2023   History of Present Illness 68 y/o woman with a history of PE on Xarelto  who presented with fall at home hitting her head, on xarelto . She had taken 2-3 Xanax  this morning. With EMS she had concern for aspiration and dehydration. In the ED she was hypotensive, given IVF with ongoing MAPs in the 40s requiring vasopressors to be started peripherally, transferred to ICU for treatment of septic shock, multilobar PNA, acute respiratory failure, acute encephalopathy, AKI on CKD 2PMH: PE, DVT on Xarelto , cirrhosis, chronic benzo use, HTN, HBV, GERD, Bipolar disorder, peripheral neuropathy    PT Comments  Pt seen for PT tx with pt agreeable. Pt is making good progress with mobility, completing bed mobility with hospital bed features & min assist, transfers & short distance gait with RW & CGA. Pt does endorse 4 admissions in the past month but not sure this is accurate. Continue to recommend post acute rehab <3 hours therapy/day as pt is currently unsafe to d/c home without 24 hr assistance & still remains a high fall risk.   If plan is discharge home, recommend the following: A little help with walking and/or transfers;A little help with bathing/dressing/bathroom;Assistance with cooking/housework;Direct supervision/assist for medications management;Direct supervision/assist for financial management;Assist for transportation;Help with stairs or ramp for entrance;Supervision due to cognitive status   Can travel by private vehicle     Yes  Equipment Recommendations  BSC/3in1;Rolling walker (2 wheels)    Recommendations for Other Services       Precautions / Restrictions Precautions Precautions: Fall Restrictions Weight Bearing Restrictions Per Provider Order: No     Mobility  Bed Mobility Overal bed mobility: Needs Assistance Bed Mobility: Supine to Sit     Supine to sit:  Min assist, HOB elevated, Used rails (exit L side of bed)          Transfers Overall transfer level: Needs assistance Equipment used: Rolling walker (2 wheels) Transfers: Sit to/from Stand Sit to Stand: Contact guard assist   Step pivot transfers: Contact guard assist (bed>recliner on L with RW)       General transfer comment: cuing re: hand placement to push to stand with 1UE, other UE on RW, sit<>stand from EOB, recliner    Ambulation/Gait Ambulation/Gait assistance: Contact guard assist Gait Distance (Feet): 15 Feet Assistive device: Rolling walker (2 wheels) Gait Pattern/deviations: Decreased step length - left, Decreased step length - right, Decreased stride length, Decreased weight shift to right Gait velocity: decreased     General Gait Details: decreased weight shift to RLE 2/2 pain, decreased step length LLE 2/2 decreased weight shift to R, extra time to steer RW in small space in room   Stairs             Wheelchair Mobility     Tilt Bed    Modified Rankin (Stroke Patients Only)       Balance Overall balance assessment: Needs assistance Sitting-balance support: No upper extremity supported, Feet supported Sitting balance-Leahy Scale: Fair     Standing balance support: During functional activity, Bilateral upper extremity supported, Reliant on assistive device for balance Standing balance-Leahy Scale: Poor                              Communication Communication Communication: No apparent difficulties  Cognition Arousal: Alert Behavior During Therapy: Impulsive   PT - Cognitive impairments: Awareness,  Sequencing, Problem solving, Safety/Judgement, Attention                           Following commands impaired: Follows multi-step commands inconsistently    Cueing Cueing Techniques: Verbal cues, Gestural cues, Tactile cues, Visual cues  Exercises      General Comments General comments (skin integrity, edema,  etc.): Pt on room air upon PT arrival, lowest SPO2 reading of 81% during gait but immediately increased to 88% so question accuracy of low reading. PT provides cuing re: pursed lip breathing. Pt does endorse feeling SOB at times but VSS.      Pertinent Vitals/Pain Pain Assessment Pain Assessment: Faces Faces Pain Scale: Hurts even more Pain Location: RLE, R shoulder Pain Descriptors / Indicators: Discomfort, Grimacing, Guarding, Sore Pain Intervention(s): Monitored during session, Limited activity within patient's tolerance, Repositioned    Home Living                          Prior Function            PT Goals (current goals can now be found in the care plan section) Acute Rehab PT Goals PT Goal Formulation: With patient Time For Goal Achievement: 07/25/23 Potential to Achieve Goals: Fair Progress towards PT goals: Progressing toward goals    Frequency    Min 2X/week      PT Plan      Co-evaluation              AM-PAC PT 6 Clicks Mobility   Outcome Measure  Help needed turning from your back to your side while in a flat bed without using bedrails?: A Little Help needed moving from lying on your back to sitting on the side of a flat bed without using bedrails?: A Lot Help needed moving to and from a bed to a chair (including a wheelchair)?: A Little Help needed standing up from a chair using your arms (e.g., wheelchair or bedside chair)?: A Little Help needed to walk in hospital room?: A Little Help needed climbing 3-5 steps with a railing? : A Lot 6 Click Score: 16    End of Session   Activity Tolerance: Patient tolerated treatment well;Patient limited by fatigue Patient left: in chair;with chair alarm set;with call bell/phone within reach;with nursing/sitter in room (MD in room) Nurse Communication: Mobility status PT Visit Diagnosis: Other abnormalities of gait and mobility (R26.89);Muscle weakness (generalized) (M62.81);Unsteadiness on feet  (R26.81);History of falling (Z91.81);Difficulty in walking, not elsewhere classified (R26.2);Pain Pain - Right/Left: Right Pain - part of body: Leg     Time: 9562-1308 PT Time Calculation (min) (ACUTE ONLY): 27 min  Charges:    $Therapeutic Activity: 23-37 mins PT General Charges $$ ACUTE PT VISIT: 1 Visit                     Emaline Handsome, PT, DPT 07/13/23, 9:58 AM   Venetta Gill 07/13/2023, 9:56 AM

## 2023-07-13 NOTE — TOC Progression Note (Addendum)
 Transition of Care [facility]) - Progression Note    Patient Details  Name: [name] MRN: [id number] Date of Birth: 25-Jan-1955  Transition of Care [facility]) CM/SW Contact  Ernst Heap Phone Number: [phone] 07/13/2023, 11:51 AM  Clinical Narrative:   11:50 AM- CSW attempted to reach the patients daughter, [name] by phone. It appears that the number listed on file is disconnected. CSW made two attempts to see if the number would go through.  11:52 AM- CSW attempted to make contact with the patients granddaughter, Adelina Adu and left a VM asking if there was another available for [employer].   11:54 AM- CSW attempted to make contact with the patients grandson, Monette Angus and left a VM asking if there was another available for [employer].   12:03 PM- CSW received a call back from the patients grandson, Anibal Barker who stated that the patients daughter [name] is on vacation in [place] and will be gone for another week and a half. CSW stated that she will update unit SW and discuss with the patient.   12:20 PM- CSW received a call form the patients daughter who stated that he phone is disconnected because she is out of the country. Patients daughter stated that her son or daughter need to be contacted and then they will make contact with her to contact whomever from the hospital. CSW inquired about SNF or [facility] services. Patients family stated that they would like to decline SNF and would prefer [facility] services. Patients daughter stated that whatever additional equipment needs to be ordered she would like that to be handled from s hospital standpoint if possible. Patients grandson will provide transportation at dc. CSW notified NCM via secure chat.   TOC will continue following.     Expected Discharge Plan: Skilled Nursing Facility Barriers to Discharge: Continued Medical Work up  Expected Discharge Plan and Services In-house Referral: Clinical Social Work     Living arrangements for the past 2 months: Single  Family Home                                       Social Determinants of Health (SDOH) Interventions SDOH Screenings   Food Insecurity: No Food Insecurity (06/27/2023)   Received from [facility]  Recent Concern: Food Insecurity - Food Insecurity Present (04/16/2023)   Received from [facility]  Housing: Low Risk  (06/27/2023)   Received from [facility]  Transportation Needs: No Transportation Needs (06/27/2023)   Received from [facility]  Utilities: Not At Risk (06/27/2023)   Received from [facility]  Financial Resource Strain: Low Risk  (06/27/2023)   Received from [facility]  Physical Activity: Unknown (06/27/2023)   Received from [facility]  Social Connections: Socially Integrated (06/27/2023)   Received from [facility]  Stress: No Stress Concern Present (06/27/2023)   Received from [facility]  Tobacco Use: Medium Risk (07/10/2023)    Readmission Risk Interventions    08/24/2022   11:55 AM 06/26/2022   12:01 PM  Readmission Risk Prevention Plan  Transportation Screening Complete Complete  PCP or Specialist Appt within 5-7 Days  Complete  Home Care Screening  Complete  Medication Review (RN CM)  Complete  HRI or Home Care Consult Complete   SW Recovery Care/Counseling Consult Complete

## 2023-07-13 NOTE — Plan of Care (Signed)
  Problem: Coping: Goal: Ability to adjust to condition or change in health will improve Outcome: Progressing   Problem: Fluid Volume: Goal: Ability to maintain a balanced intake and output will improve Outcome: Progressing   Problem: Metabolic: Goal: Ability to maintain appropriate glucose levels will improve Outcome: Progressing   Problem: Skin Integrity: Goal: Risk for impaired skin integrity will decrease Outcome: Progressing

## 2023-07-13 NOTE — Plan of Care (Signed)
 Pt has rested quietly throughout the night with no distress noted. Alert and oriented to person and place but not situations at times. On O2 2LNC. SR on the monitor. Foley cath had to be placed to BSD d/t pt crying with bladder spasms and unable to void. Bladder scan was 644 and when foley placed there was an immediate 1000cc return of yellow urine. Pt moves around in bed. No complaints voiced after foley placed.     Problem: Skin Integrity: Goal: Risk for impaired skin integrity will decrease Outcome: Progressing   Problem: Tissue Perfusion: Goal: Adequacy of tissue perfusion will improve Outcome: Progressing   Problem: Clinical Measurements: Goal: Respiratory complications will improve Outcome: Progressing Goal: Cardiovascular complication will be avoided Outcome: Progressing   Problem: Pain Managment: Goal: General experience of comfort will improve and/or be controlled Outcome: Progressing

## 2023-07-14 DIAGNOSIS — R6521 Severe sepsis with septic shock: Secondary | ICD-10-CM | POA: Diagnosis not present

## 2023-07-14 DIAGNOSIS — A419 Sepsis, unspecified organism: Secondary | ICD-10-CM | POA: Diagnosis not present

## 2023-07-14 LAB — GLUCOSE, CAPILLARY
Glucose-Capillary: 106 mg/dL — ABNORMAL HIGH (ref 70–99)
Glucose-Capillary: 96 mg/dL (ref 70–99)
Glucose-Capillary: 98 mg/dL (ref 70–99)
Glucose-Capillary: 123 mg/dL — ABNORMAL HIGH (ref 70–99)

## 2023-07-14 NOTE — Plan of Care (Signed)
  Problem: Health Behavior/Discharge Planning: Goal: Ability to manage health-related needs will improve Outcome: Progressing   Problem: Clinical Measurements: Goal: Will remain free from infection Outcome: Progressing Goal: Respiratory complications will improve Outcome: Progressing   Problem: Activity: Goal: Risk for activity intolerance will decrease Outcome: Progressing   Problem: Safety: Goal: Ability to remain free from injury will improve Outcome: Progressing   

## 2023-07-14 NOTE — Progress Notes (Signed)
 PROGRESS NOTE    [name]  FMW:[id number] DOB: 10-30-1955 DOA: 07/10/2023 PCP: Gladystine Erminio CROME, MD   Brief Narrative:  [name] is a 68 y/o woman with a history of PE on Xarelto  who presented with fall at home hitting her head, on xarelto . She had taken 2-3 Xanax  this morning. With EMS she had concern for aspiration and dehydration. In the ED she was hypotensive, given IVF with ongoing MAPs in the 40s requiring vasopressors to be started peripherally. She says yes to all questions-- asking about fevers, cough, vomiting, diarrhea. No family at bedside to corroborate history. Blood cultures were collected and she was started on unasyn  for possible aspiration pneumonia. PCCM consulted for septic shock.  Patient continued to improve after admission, off pressors as of 07/12/2023, transferred to [facility]/hospitalist group 6/20   Assessment & Plan:   Principal Problem:   Septic shock (HCC)  Septic shock due to strep pneumoniae pneumonia, POA -With secondary lactic acidosis, encephalopathy, hypoxia, AKI, and thrombocytopenia  -Shock resolved with supportive care/antibiotics -Cultures remain negative - Continue antibiotics(ceftriaxone ) plan for 7-day course given severity of illness - Nor epi off 6/18 -wean midodrine  as appropriate  Acute respiratory failure with hypoxia due to pneumonia H/o COPD; not acutely exacerbated -wean O2 to maintain SpO2 >88% -ceftriaxone  x 7 days total -Breo, resume symbicort  at discharge -Continue mucinex   Acute encephalopathy Likely metabolic  Cannot rule out toxic component History of bipolar disorder - Improving, follow-up with family, unclear if patient is back to baseline - Likely secondary to septic shock, AKI - Chronic Xanax  use, benzo positive UDS, unclear if overuse/overdose - Continue Depakote   Acute thrombocytopenia - Likely acute phase secondary to sepsis, stabilizing around 110  AKI on CKD 2 -Creatinine downtrending with supportive  care now off pressors - Urine output appropriate Shock resolved.   Cirrhosis, chronic HBV - MELD-Na+ = 26 (artificially increased INR due to DOAC) - Avoid hepatically cleared meds - Restart PTA tenofovir ; recently been on taking Entecavir  but no longer as an outpatient.   - Discussed with pharmacy, given antibody negative testing would likely qualify for immunization - follow-up with her liver specialty team at [facility].   Acute on chronic anemia of iron deficiency - No signs or symptoms of bleeding - Stabilizing around 8 - Initial drop likely hemodilutional - Iron deficiency noted with normal ferritin despite acute infection - FOBT pending  New onset paroxysmal Afib -Already on DOAC for PE history -Continue to hold carvedilol  given borderline hypotension/bradycardia -Switching her to apixaban  due to chronic liver disease -reports allergy to heparin  (itching)   Fall on xarelto ; no major injuries found on trauma workup -appreciate trauma evaluation; taken out of [facility] -PT, OT  Renal cyst, enlarging - Follow repeat imaging outpatient with MRI per radiology recommendations   Chronic back pain Chronic neuropathic pain -oxycodone  as needed - Continue gabapentin   CT with edema and fat stranding in lower abdomen wall- -no clear etiology, remains asymptomatic, follow clinically   Urinary retention at admission -foley d/c; if still retaining can start flomax     DVT prophylaxis: SCDs Start: 07/10/23 1154 apixaban  (ELIQUIS ) tablet 5 mg  Code Status:   Code Status: Full Code Family Communication: None present Daughter is in [place] and not been available by phone - she is there for another 10 days per patient.  Status is: Inpatient  Dispo: The patient is from: Home              Anticipated d/c is to: SNF  Anticipated d/c date is: 71-72h              Patient currently is medically stable for discharge  Consultants:  PCCM  Procedures:  None  Antimicrobials:   Ceftriaxone   Subjective: No acute issues or events overnight, denies nausea vomiting diarrhea constipation any fever chills or chest pain  Objective: Vitals:   07/13/23 2017 07/13/23 2321 07/14/23 0330 07/14/23 0500  BP: 113/88 118/66 (!) 118/58   Pulse: 95 91 74   Resp: 16 20 20    Temp: 98.3 F (36.8 C) 98.3 F (36.8 C) 97.6 F (36.4 C)   TempSrc: Oral Oral Axillary   SpO2: 95% 93% 96%   Weight:    110.3 kg  Height:        Intake/Output Summary (Last 24 hours) at 07/14/2023 9287 Last data filed at 07/14/2023 0330 Gross per 24 hour  Intake --  Output 3850 ml  Net -3850 ml   Filed Weights   07/10/23 0933 07/13/23 0600 07/14/23 0500  Weight: 103.9 kg 111.9 kg 110.3 kg    Examination:  General:  Pleasantly resting in bed, No acute distress.  Alert to person and place only HEENT:  Normocephalic atraumatic.  Sclerae nonicteric, noninjected.  Extraocular movements intact bilaterally. Neck:  Without mass or deformity.  Trachea is midline. Lungs:  Clear to auscultate bilaterally without rhonchi, wheeze, or rales. Heart:  Regular rate and rhythm.  Without murmurs, rubs, or gallops. Abdomen:  Soft, nontender, nondistended.  Without guarding or rebound. Extremities: Without cyanosis, clubbing Skin:  Warm and dry, no erythema.  Data Reviewed: I have personally reviewed following labs and imaging studies  CBC: Recent Labs  Lab 07/10/23 0929 07/10/23 0935 07/10/23 1013 07/11/23 0440 07/12/23 0427 07/13/23 0729  WBC 11.5*  --   --  11.4* 5.5 3.5*  HGB 10.4* 11.6* 10.9* 8.5* 8.1* 8.2*  HCT 34.6* 34.0* 32.0* 28.6* 26.2* 26.7*  MCV 85.4  --   --  84.9 83.2 83.4  PLT 170  --   --  133* 113* 111*   Basic Metabolic Panel: Recent Labs  Lab 07/10/23 0929 07/10/23 0935 07/10/23 1013 07/11/23 0440 07/12/23 0427 07/13/23 0729  NA 134* 132* 133* 137 134* 137  K 4.5 4.5 4.5 3.8 4.2 4.2  CL 100 98  --  106 105 104  CO2 21*  --   --  22 24 27   GLUCOSE 92 90  --  106* 74 75   BUN 19 22  --  16 15 10   CREATININE 2.14* 2.20*  --  1.48* 1.30* 1.12*  CALCIUM 9.4  --   --  8.2* 8.2* 8.0*  MG  --   --   --  1.6* 1.9  --   PHOS  --   --   --  3.2  --   --    GFR: Estimated Creatinine Clearance: 60 mL/min (A) (by C-G formula based on SCr of 1.12 mg/dL (H)). Liver Function Tests: Recent Labs  Lab 07/10/23 0929 07/11/23 0440 07/12/23 0427 07/13/23 0729  AST 23 35 34 23  ALT 14 16 17 16   ALKPHOS 79 59 54 57  BILITOT 0.7 0.7 0.6 0.6  PROT 6.2* 5.4* 5.1* 5.2*  ALBUMIN 2.9* 2.3* 2.2* 2.2*   Recent Labs  Lab 07/10/23 1007  AMMONIA <13   Coagulation Profile: Recent Labs  Lab 07/10/23 0929  INR 2.3*   CBG: Recent Labs  Lab 07/13/23 1137 07/13/23 1208 07/13/23 1225 07/13/23 1752 07/13/23 2116  GLUCAP  69* 52* 92 113* 100*   Sepsis Labs: Recent Labs  Lab 07/10/23 0936 07/10/23 1058 07/10/23 1224  LATICACIDVEN 3.9* 3.6* 3.3*    Recent Results (from the past 240 hours)  Blood culture (routine x 2)     Status: None (Preliminary result)   Collection Time: 07/10/23 10:40 AM   Specimen: BLOOD RIGHT WRIST  Result Value Ref Range Status   Specimen Description BLOOD RIGHT WRIST  Final   Special Requests   Final    BOTTLES DRAWN AEROBIC AND ANAEROBIC Blood Culture results may not be optimal due to an inadequate volume of blood received in culture bottles   Culture   Final    NO GROWTH 3 DAYS Performed at [facility], [address] [place], [place] [address]    Report Status PENDING  Incomplete  Respiratory (~20 pathogens) panel by PCR     Status: None   Collection Time: 07/10/23 11:39 AM   Specimen: Nasopharyngeal Swab; Respiratory  Result Value Ref Range Status   Adenovirus NOT DETECTED NOT DETECTED Final   Coronavirus 229E NOT DETECTED NOT DETECTED Final    Comment: (NOTE) The Coronavirus on the Respiratory Panel, DOES NOT test for the novel  Coronavirus (2019 nCoV)    Coronavirus HKU1 NOT DETECTED NOT DETECTED Final    Coronavirus NL63 NOT DETECTED NOT DETECTED Final   Coronavirus OC43 NOT DETECTED NOT DETECTED Final   Metapneumovirus NOT DETECTED NOT DETECTED Final   Rhinovirus / Enterovirus NOT DETECTED NOT DETECTED Final   Influenza A NOT DETECTED NOT DETECTED Final   Influenza B NOT DETECTED NOT DETECTED Final   Parainfluenza Virus 1 NOT DETECTED NOT DETECTED Final   Parainfluenza Virus 2 NOT DETECTED NOT DETECTED Final   Parainfluenza Virus 3 NOT DETECTED NOT DETECTED Final   Parainfluenza Virus 4 NOT DETECTED NOT DETECTED Final   Respiratory Syncytial Virus NOT DETECTED NOT DETECTED Final   Bordetella pertussis NOT DETECTED NOT DETECTED Final   Bordetella Parapertussis NOT DETECTED NOT DETECTED Final   Chlamydophila pneumoniae NOT DETECTED NOT DETECTED Final   Mycoplasma pneumoniae NOT DETECTED NOT DETECTED Final    Comment: Performed at [facility], [address], [place], [place] [address]  SARS Coronavirus 2 by RT PCR (hospital order, performed in [facility] hospital lab) *cepheid single result test*     Status: None   Collection Time: 07/10/23 11:39 AM  Result Value Ref Range Status   SARS Coronavirus 2 by RT PCR NEGATIVE NEGATIVE Final    Comment: Performed at [facility], [address] [place], [place] [address]  MRSA Next Gen by PCR, Nasal     Status: None   Collection Time: 07/10/23 12:05 PM  Result Value Ref Range Status   MRSA by PCR Next Gen NOT DETECTED NOT DETECTED Final    Comment: (NOTE) The GeneXpert MRSA Assay (FDA approved for NASAL specimens only), is one component of a comprehensive MRSA colonization surveillance program. It is not intended to diagnose MRSA infection nor to guide or monitor treatment for MRSA infections. Test performance is not FDA approved in patients less than 20 years old. Performed at [facility], [address] [place], [place] [address]   Culture, blood (single)     Status: None (Preliminary result)   Collection Time:  07/10/23  2:52 PM   Specimen: BLOOD  Result Value Ref Range Status   Specimen Description BLOOD SITE NOT SPECIFIED  Final   Special Requests  Final    BOTTLES DRAWN AEROBIC AND ANAEROBIC Blood Culture adequate volume   Culture   Final    NO GROWTH 3 DAYS Performed at [facility], [address] [place], [place] [address]    Report Status PENDING  Incomplete  Blood culture (routine x 2)     Status: None (Preliminary result)   Collection Time: 07/10/23  2:54 PM   Specimen: BLOOD  Result Value Ref Range Status   Specimen Description BLOOD SITE NOT SPECIFIED  Final   Special Requests   Final    BOTTLES DRAWN AEROBIC AND ANAEROBIC Blood Culture adequate volume   Culture   Final    NO GROWTH 3 DAYS Performed at [facility], [address], [place], [place] [address]    Report Status PENDING  Incomplete    Radiology Studies: No results found.  Scheduled Meds:  albuterol   2.5 mg Nebulization BID   apixaban   5 mg Oral BID   Chlorhexidine  Gluconate Cloth  6 each Topical Daily   cholecalciferol   2,000 Units Oral Daily   divalproex   500 mg Oral Daily   doxepin   100 mg Oral QHS   famotidine   20 mg Oral BID   ferrous sulfate   325 mg Oral Daily   fluticasone  furoate-vilanterol  1 puff Inhalation Daily   gabapentin   300 mg Oral TID   guaiFENesin   600 mg Oral BID   insulin  aspart  0-5 Units Subcutaneous QHS   insulin  aspart  0-6 Units Subcutaneous TID WC   midodrine   10 mg Oral Q8H   montelukast   10 mg Oral Daily   mouth rinse  15 mL Mouth Rinse 4 times per day   pantoprazole   40 mg Oral BID AC   tamsulosin   0.4 mg Oral Daily   tenofovir  alafenamide  25 mg Oral Daily   Continuous Infusions:  cefTRIAXone  (ROCEPHIN )  IV 2 g (07/13/23 0926)     LOS: 4 days   Time spent:  Elsie JAYSON Montclair, DO [facility]  If 7PM-7AM, please contact night-coverage [email or web address]  07/14/2023, 7:12 AM

## 2023-07-15 DIAGNOSIS — J690 Pneumonitis due to inhalation of food and vomit: Secondary | ICD-10-CM

## 2023-07-15 DIAGNOSIS — K7460 Unspecified cirrhosis of liver: Secondary | ICD-10-CM

## 2023-07-15 DIAGNOSIS — A419 Sepsis, unspecified organism: Secondary | ICD-10-CM | POA: Diagnosis not present

## 2023-07-15 DIAGNOSIS — J9601 Acute respiratory failure with hypoxia: Secondary | ICD-10-CM

## 2023-07-15 LAB — GLUCOSE, CAPILLARY
Glucose-Capillary: 104 mg/dL — ABNORMAL HIGH (ref 70–99)
Glucose-Capillary: 111 mg/dL — ABNORMAL HIGH (ref 70–99)
Glucose-Capillary: 137 mg/dL — ABNORMAL HIGH (ref 70–99)
Glucose-Capillary: 154 mg/dL — ABNORMAL HIGH (ref 70–99)
Glucose-Capillary: 231 mg/dL — ABNORMAL HIGH (ref 70–99)

## 2023-07-15 LAB — DRUG SCREEN, UR (12+OXYCODONE+CRT)
Amphetamine Scrn, Ur: NEGATIVE ng/mL
BARBITURATE SCREEN URINE: NEGATIVE ng/mL
Benzodiazepines Screen, Urine: POSITIVE ng/mL — AB
CANNABINOIDS UR QL SCN: NEGATIVE ng/mL
Cocaine (Metab) Scrn, Ur: NEGATIVE ng/mL
Creatinine(Crt), U: 50.8 mg/dL (ref 20.0–300.0)
Fentanyl, Urine: NEGATIVE pg/mL
Meperidine Screen, Urine: NEGATIVE ng/mL
Methadone Screen, Urine: NEGATIVE ng/mL
Opiate Scrn, Ur: NEGATIVE ng/mL
Oxycodone/Oxymorphone Urine: NEGATIVE ng/mL
Ph of Urine: 5.6 (ref 4.5–8.9)
Phencyclidine Screen, Urine: NEGATIVE ng/mL
Propoxyphene Scrn, Ur: NEGATIVE ng/mL
SPECIFIC GRAVITY: 1.006
Tramadol Screen, Urine: POSITIVE — AB

## 2023-07-15 LAB — CULTURE, BLOOD (ROUTINE X 2)
Culture: NO GROWTH
Culture: NO GROWTH
Special Requests: ADEQUATE

## 2023-07-15 LAB — CULTURE, BLOOD (SINGLE)
Culture: NO GROWTH
Special Requests: ADEQUATE

## 2023-07-15 MED ORDER — ALUM & MAG HYDROXIDE-SIMETH 200-200-20 MG/5ML PO SUSP
30.0000 mL | Freq: Four times a day (QID) | ORAL | Status: DC | PRN
  Administered 2023-07-15 – 2023-07-16 (×2): 30 mL via ORAL
  Filled 2023-07-15 (×2): qty 30

## 2023-07-15 MED ORDER — MIDODRINE HCL 5 MG PO TABS
5.0000 mg | ORAL_TABLET | Freq: Three times a day (TID) | ORAL | Status: DC
  Administered 2023-07-15 – 2023-07-16 (×4): 5 mg via ORAL
  Filled 2023-07-15 (×5): qty 1

## 2023-07-15 NOTE — Plan of Care (Signed)
  Problem: Health Behavior/Discharge Planning: Goal: Ability to manage health-related needs will improve Outcome: Progressing   Problem: Clinical Measurements: Goal: Will remain free from infection Outcome: Progressing Goal: Respiratory complications will improve Outcome: Progressing   Problem: Activity: Goal: Risk for activity intolerance will decrease Outcome: Progressing   Problem: Safety: Goal: Ability to remain free from injury will improve Outcome: Progressing   Problem: Skin Integrity: Goal: Risk for impaired skin integrity will decrease Outcome: Progressing

## 2023-07-15 NOTE — Plan of Care (Signed)
 Patient alert and oriented, very talkative.  Sitting in recliner chair with resp even and unlabored.  Continuous pulse oximetry monitoring with O2 Sats 95% room air.  Call light within reach. Chair alarm set.  Informed patient to call  prior getting out of chair.  Walker at bedside. Frequent reminders and reinforcements necessary and provided as patient feels that she does not need any help. Provided in-depth explanation of patient safety which includes fall prevention.  Door remains open for frequent observation of patient.  Will continue to monitor.  Problem: Education: Goal: Ability to describe self-care measures that may prevent or decrease complications (Diabetes Survival Skills Education) will improve Outcome: Progressing Goal: Individualized Educational Video(s) Outcome: Progressing   Problem: Coping: Goal: Ability to adjust to condition or change in health will improve Outcome: Progressing   Problem: Fluid Volume: Goal: Ability to maintain a balanced intake and output will improve Outcome: Progressing   Problem: Health Behavior/Discharge Planning: Goal: Ability to identify and utilize available resources and services will improve Outcome: Progressing Goal: Ability to manage health-related needs will improve Outcome: Progressing   Problem: Metabolic: Goal: Ability to maintain appropriate glucose levels will improve Outcome: Progressing   Problem: Nutritional: Goal: Maintenance of adequate nutrition will improve Outcome: Progressing Goal: Progress toward achieving an optimal weight will improve Outcome: Progressing   Problem: Skin Integrity: Goal: Risk for impaired skin integrity will decrease Outcome: Progressing   Problem: Tissue Perfusion: Goal: Adequacy of tissue perfusion will improve Outcome: Progressing   Problem: Education: Goal: Knowledge of General Education information will improve Description: Including pain rating scale, medication(s)/side effects and  non-pharmacologic comfort measures Outcome: Progressing   Problem: Health Behavior/Discharge Planning: Goal: Ability to manage health-related needs will improve Outcome: Progressing   Problem: Clinical Measurements: Goal: Ability to maintain clinical measurements within normal limits will improve Outcome: Progressing Goal: Will remain free from infection Outcome: Progressing Goal: Diagnostic test results will improve Outcome: Progressing Goal: Respiratory complications will improve Outcome: Progressing Goal: Cardiovascular complication will be avoided Outcome: Progressing   Problem: Activity: Goal: Risk for activity intolerance will decrease Outcome: Progressing   Problem: Nutrition: Goal: Adequate nutrition will be maintained Outcome: Progressing   Problem: Coping: Goal: Level of anxiety will decrease Outcome: Progressing   Problem: Elimination: Goal: Will not experience complications related to bowel motility Outcome: Progressing Goal: Will not experience complications related to urinary retention Outcome: Progressing   Problem: Pain Managment: Goal: General experience of comfort will improve and/or be controlled Outcome: Progressing   Problem: Safety: Goal: Ability to remain free from injury will improve Outcome: Progressing   Problem: Skin Integrity: Goal: Risk for impaired skin integrity will decrease Outcome: Progressing

## 2023-07-15 NOTE — Progress Notes (Signed)
 PROGRESS NOTE        PATIENT DETAILS Name: [name] Age: 68 y.o. Sex: female Date of Birth: 1955/04/06 Admit Date: 07/10/2023 Admitting Physician Leita SHAUNNA Gaskins, DO ERE:[id number], Erminio CROME, MD  Brief Summary: Patient is a 68 y.o.  female with history of VTE on Xarelto , cirrhosis/chronic HBV, chronic pain syndrome, iron deficiency anemia-who fell at home-hit her head (had taken 2-3 Xanax  in the morning)-she was brought to the ED where she was found to have septic shock secondary to pneumococcal pneumonia  Significant events: 6/17>> septic shock-admit to ICU  Significant studies: 6/17>> CT head: No acute intracranial abnormality 6/17>> CT C-spine: No evidence of acute traumatic injury 6/17>> CT maxillofacial: No acute traumatic injury. 6/17>> CT chest/abdomen/pelvis: No acute traumatic injury-multifocal parenchymal ill-defined opacities, chronic liver disease with nodular fatty liver.  Splenomegaly.  Significant microbiology data: 6/17>> respiratory virus panel: Negative 6/17>> COVID PCR: Negative 6/17>> blood culture: Negative 6/17>> urine pneumococcal antigen: Positive  Procedures: None  Consults: Trauma PCCM  Subjective: Lying comfortably in bed-denies any chest pain or shortness of breath.  Objective: Vitals: Blood pressure (!) 137/96, pulse 81, temperature 98.2 F (36.8 C), temperature source Oral, resp. rate 14, height 5' 4 (1.626 m), weight 110 kg, SpO2 92%.   Exam: Gen Exam:Alert awake-not in any distress HEENT:atraumatic, normocephalic Chest: B/L clear to auscultation anteriorly CVS:S1S2 regular Abdomen:soft non tender, non distended Extremities:no edema Neurology: Non focal Skin: no rash  Pertinent Labs/Radiology:    Latest Ref Rng & Units 07/13/2023    7:29 AM 07/12/2023    4:27 AM 07/11/2023    4:40 AM  CBC  WBC 4.0 - 10.5 K/uL 3.5  5.5  11.4   Hemoglobin 12.0 - 15.0 g/dL 8.2  8.1  8.5   Hematocrit 36.0 - 46.0 % 26.7   26.2  28.6   Platelets 150 - 400 K/uL 111  113  133     Lab Results  Component Value Date   NA 137 07/13/2023   K 4.2 07/13/2023   CL 104 07/13/2023   CO2 27 07/13/2023      Assessment/Plan: Septic shock secondary to pneumonia (urine streptococcal antigen positive) Sepsis physiology has resolved-did require vasopressors while in the ICU. Culture data as above Rocephin  x 7 days plan given severity of illness Decrease midodrine  to 5 mg 3 times daily-with holding parameters.  Acute hypoxic respiratory failure Secondary pneumonia Continue attempts to wean down FiO2  Acute toxic metabolic encephalopathy Secondary to combination of Xanax /PNA/sepsis She was lethargic and confused on admission per H&P Currently clinically improved-completely awake and alert.  AKI on CKD 2 Hemodynamically mediated in the setting of sepsis AKI has resolved-creatinine back to baseline  Cirrhosis Chronic HBV infection Stable Follow-up with GI as an outpatient Tenofovir  has been resumed  PAF Telemetry monitoring On Coreg  Already on anticoagulation  Normocytic anemia Likely secondary to acute illness-no evidence of blood loss Follow CBC periodically  Thrombocytopenia Mild Likely secondary to sepsis Follow CBC.  History of VTE Previously on Xarelto -has been switched to Eliquis   Mechanical fall Pan CT imaging was negative-no further recommendations by trauma  Chronic pain Chronic neuropathy Continue gabapentin  As needed oxycodone .  COPD Not in exacerbation Continue bronchodilators  Multiple cystic foci including 1 lesion along the left kidney which is slightly larger from previous scans Incidental finding-stable for dedicated MRI as an outpatient.  Deconditioning/debility  Due to acute illness PT/OT eval-SNF recommended  Morbid Obesity: Estimated body mass index is 41.63 kg/m as calculated from the following:   Height as of this encounter: 5' 4 (1.626 m).   Weight as of  this encounter: 110 kg.   Code status:   Code Status: Full Code   DVT Prophylaxis: SCDs Start: 07/10/23 1154 apixaban  (ELIQUIS ) tablet 5 mg    Family Communication: None at bedside   Disposition Plan: Status is: Inpatient Remains inpatient appropriate because: Severity of illness   Planned Discharge Destination:Skilled nursing facility   Diet: Diet Order             Diet regular Room service appropriate? Yes; Fluid consistency: Thin  Diet effective now                     Antimicrobial agents: Anti-infectives (From admission, onward)    Start     Dose/Rate Route Frequency Ordered Stop   07/12/23 1015  tenofovir  alafenamide (VEMLIDY ) tablet 25 mg        25 mg Oral Daily 07/12/23 0915     07/11/23 1000  cefTRIAXone  (ROCEPHIN ) 2 g in sodium chloride  0.9 % 100 mL IVPB        2 g 200 mL/hr over 30 Minutes Intravenous Every 24 hours 07/11/23 0906 07/18/23 0959   07/10/23 2200  cefTRIAXone  (ROCEPHIN ) 2 g in sodium chloride  0.9 % 100 mL IVPB  Status:  Discontinued        2 g 200 mL/hr over 30 Minutes Intravenous Every 24 hours 07/10/23 1206 07/10/23 1241   07/10/23 1530  entecavir  (BARACLUDE ) tablet 0.5 mg  Status:  Discontinued        0.5 mg Oral Every other day 07/10/23 1433 07/12/23 0915   07/10/23 1432  valACYclovir (VALTREX) tablet 500 mg        500 mg Oral Daily PRN 07/10/23 1433     07/10/23 1300  piperacillin -tazobactam (ZOSYN ) IVPB 3.375 g  Status:  Discontinued        3.375 g 12.5 mL/hr over 240 Minutes Intravenous Every 8 hours 07/10/23 1247 07/11/23 0906   07/10/23 1215  doxycycline  (VIBRAMYCIN ) 100 mg in sodium chloride  0.9 % 250 mL IVPB  Status:  Discontinued        100 mg 125 mL/hr over 120 Minutes Intravenous Every 12 hours 07/10/23 1206 07/11/23 0906   07/10/23 1030  Ampicillin -Sulbactam (UNASYN ) 3 g in sodium chloride  0.9 % 100 mL IVPB        3 g 200 mL/hr over 30 Minutes Intravenous  Once 07/10/23 1016 07/10/23 1123         MEDICATIONS: Scheduled Meds:  albuterol   2.5 mg Nebulization BID   apixaban   5 mg Oral BID   Chlorhexidine  Gluconate Cloth  6 each Topical Daily   cholecalciferol   2,000 Units Oral Daily   divalproex   500 mg Oral Daily   doxepin   100 mg Oral QHS   famotidine   20 mg Oral BID   ferrous sulfate   325 mg Oral Daily   fluticasone  furoate-vilanterol  1 puff Inhalation Daily   gabapentin   300 mg Oral TID   guaiFENesin   600 mg Oral BID   insulin  aspart  0-5 Units Subcutaneous QHS   insulin  aspart  0-6 Units Subcutaneous TID WC   midodrine   10 mg Oral Q8H   montelukast   10 mg Oral Daily   mouth rinse  15 mL Mouth Rinse 4 times per day   pantoprazole   40 mg Oral BID AC   tamsulosin   0.4 mg Oral Daily   tenofovir  alafenamide  25 mg Oral Daily   Continuous Infusions:  cefTRIAXone  (ROCEPHIN )  IV Stopped (07/14/23 1010)   PRN Meds:.albuterol , ALPRAZolam , docusate sodium , flavoxATE, ondansetron  (ZOFRAN ) IV, mouth rinse, oxyCODONE , polyethylene glycol, valACYclovir   I have personally reviewed following labs and imaging studies  LABORATORY DATA: CBC: Recent Labs  Lab 07/10/23 0929 07/10/23 0935 07/10/23 1013 07/11/23 0440 07/12/23 0427 07/13/23 0729  WBC 11.5*  --   --  11.4* 5.5 3.5*  HGB 10.4* 11.6* 10.9* 8.5* 8.1* 8.2*  HCT 34.6* 34.0* 32.0* 28.6* 26.2* 26.7*  MCV 85.4  --   --  84.9 83.2 83.4  PLT 170  --   --  133* 113* 111*    Basic Metabolic Panel: Recent Labs  Lab 07/10/23 0929 07/10/23 0935 07/10/23 1013 07/11/23 0440 07/12/23 0427 07/13/23 0729  NA 134* 132* 133* 137 134* 137  K 4.5 4.5 4.5 3.8 4.2 4.2  CL 100 98  --  106 105 104  CO2 21*  --   --  22 24 27   GLUCOSE 92 90  --  106* 74 75  BUN 19 22  --  16 15 10   CREATININE 2.14* 2.20*  --  1.48* 1.30* 1.12*  CALCIUM 9.4  --   --  8.2* 8.2* 8.0*  MG  --   --   --  1.6* 1.9  --   PHOS  --   --   --  3.2  --   --     GFR: Estimated Creatinine Clearance: 59.9 mL/min (A) (by C-G formula based on SCr  of 1.12 mg/dL (H)).  Liver Function Tests: Recent Labs  Lab 07/10/23 0929 07/11/23 0440 07/12/23 0427 07/13/23 0729  AST 23 35 34 23  ALT 14 16 17 16   ALKPHOS 79 59 54 57  BILITOT 0.7 0.7 0.6 0.6  PROT 6.2* 5.4* 5.1* 5.2*  ALBUMIN 2.9* 2.3* 2.2* 2.2*   No results for input(s): LIPASE, AMYLASE in the last 168 hours. Recent Labs  Lab 07/10/23 1007  AMMONIA <13    Coagulation Profile: Recent Labs  Lab 07/10/23 0929  INR 2.3*    Cardiac Enzymes: No results for input(s): CKTOTAL, CKMB, CKMBINDEX, TROPONINI in the last 168 hours.  BNP (last 3 results) No results for input(s): PROBNP in the last 8760 hours.  Lipid Profile: No results for input(s): CHOL, HDL, LDLCALC, TRIG, CHOLHDL, LDLDIRECT in the last 72 hours.  Thyroid Function Tests: No results for input(s): TSH, T4TOTAL, FREET4, T3FREE, THYROIDAB in the last 72 hours.  Anemia Panel: Recent Labs    07/13/23 0729  FERRITIN 38  TIBC 267  IRON 26*    Urine analysis:    Component Value Date/Time   COLORURINE YELLOW 07/10/2023 1625   APPEARANCEUR CLEAR 07/10/2023 1625   LABSPEC 1.020 07/10/2023 1625   PHURINE 6.0 07/10/2023 1625   GLUCOSEU NEGATIVE 07/10/2023 1625   HGBUR SMALL (A) 07/10/2023 1625   BILIRUBINUR NEGATIVE 07/10/2023 1625   KETONESUR NEGATIVE 07/10/2023 1625   PROTEINUR NEGATIVE 07/10/2023 1625   UROBILINOGEN 1.0 04/03/2014 1645   NITRITE NEGATIVE 07/10/2023 1625   LEUKOCYTESUR NEGATIVE 07/10/2023 1625    Sepsis Labs: Lactic Acid, Venous    Component Value Date/Time   LATICACIDVEN 3.3 (HH) 07/10/2023 1224    MICROBIOLOGY: Recent Results (from the past 240 hours)  Blood culture (routine x 2)     Status: None   Collection Time: 07/10/23  10:40 AM   Specimen: BLOOD RIGHT WRIST  Result Value Ref Range Status   Specimen Description BLOOD RIGHT WRIST  Final   Special Requests   Final    BOTTLES DRAWN AEROBIC AND ANAEROBIC Blood Culture results may  not be optimal due to an inadequate volume of blood received in culture bottles   Culture   Final    NO GROWTH 5 DAYS Performed at [facility], [address], [place], [place] [address]    Report Status 07/15/2023 FINAL  Final  Respiratory (~20 pathogens) panel by PCR     Status: None   Collection Time: 07/10/23 11:39 AM   Specimen: Nasopharyngeal Swab; Respiratory  Result Value Ref Range Status   Adenovirus NOT DETECTED NOT DETECTED Final   Coronavirus 229E NOT DETECTED NOT DETECTED Final    Comment: (NOTE) The Coronavirus on the Respiratory Panel, DOES NOT test for the novel  Coronavirus (2019 nCoV)    Coronavirus HKU1 NOT DETECTED NOT DETECTED Final   Coronavirus NL63 NOT DETECTED NOT DETECTED Final   Coronavirus OC43 NOT DETECTED NOT DETECTED Final   Metapneumovirus NOT DETECTED NOT DETECTED Final   Rhinovirus / Enterovirus NOT DETECTED NOT DETECTED Final   Influenza A NOT DETECTED NOT DETECTED Final   Influenza B NOT DETECTED NOT DETECTED Final   Parainfluenza Virus 1 NOT DETECTED NOT DETECTED Final   Parainfluenza Virus 2 NOT DETECTED NOT DETECTED Final   Parainfluenza Virus 3 NOT DETECTED NOT DETECTED Final   Parainfluenza Virus 4 NOT DETECTED NOT DETECTED Final   Respiratory Syncytial Virus NOT DETECTED NOT DETECTED Final   Bordetella pertussis NOT DETECTED NOT DETECTED Final   Bordetella Parapertussis NOT DETECTED NOT DETECTED Final   Chlamydophila pneumoniae NOT DETECTED NOT DETECTED Final   Mycoplasma pneumoniae NOT DETECTED NOT DETECTED Final    Comment: Performed at [facility], [address], [place], [place] [address]  SARS Coronavirus 2 by RT PCR (hospital order, performed in [facility] hospital lab) *cepheid single result test*     Status: None   Collection Time: 07/10/23 11:39 AM  Result Value Ref Range Status   SARS Coronavirus 2 by RT PCR NEGATIVE NEGATIVE Final    Comment: Performed at [facility], [address], [place], [place]  [address]  MRSA Next Gen by PCR, Nasal     Status: None   Collection Time: 07/10/23 12:05 PM  Result Value Ref Range Status   MRSA by PCR Next Gen NOT DETECTED NOT DETECTED Final    Comment: (NOTE) The GeneXpert MRSA Assay (FDA approved for NASAL specimens only), is one component of a comprehensive MRSA colonization surveillance program. It is not intended to diagnose MRSA infection nor to guide or monitor treatment for MRSA infections. Test performance is not FDA approved in patients less than 88 years old. Performed at [facility], [address] [place], [place] [address]   Culture, blood (single)     Status: None   Collection Time: 07/10/23  2:52 PM   Specimen: BLOOD  Result Value Ref Range Status   Specimen Description BLOOD SITE NOT SPECIFIED  Final   Special Requests   Final    BOTTLES DRAWN AEROBIC AND ANAEROBIC Blood Culture adequate volume   Culture   Final    NO GROWTH 5 DAYS Performed at [facility] Lab, [address] [place], [place] [address]    Report Status 07/15/2023 FINAL  Final  Blood culture (routine x  2)     Status: None   Collection Time: 07/10/23  2:54 PM   Specimen: BLOOD  Result Value Ref Range Status   Specimen Description BLOOD SITE NOT SPECIFIED  Final   Special Requests   Final    BOTTLES DRAWN AEROBIC AND ANAEROBIC Blood Culture adequate volume   Culture   Final    NO GROWTH 5 DAYS Performed at [facility], [address], [place], [place] [address]    Report Status 07/15/2023 FINAL  Final    RADIOLOGY STUDIES/RESULTS: No results found.   LOS: 5 days   Donalda Applebaum, MD  [facility]    To contact the attending provider between 7A-7P or the covering provider during after hours 7P-7A, please log into the web site [email or web address] and access using universal [place] password for that web site. If you do not have the password, please call the hospital operator.  07/15/2023, 8:43 AM

## 2023-07-16 DIAGNOSIS — K7460 Unspecified cirrhosis of liver: Secondary | ICD-10-CM | POA: Diagnosis not present

## 2023-07-16 DIAGNOSIS — J690 Pneumonitis due to inhalation of food and vomit: Secondary | ICD-10-CM | POA: Diagnosis not present

## 2023-07-16 DIAGNOSIS — J9601 Acute respiratory failure with hypoxia: Secondary | ICD-10-CM | POA: Diagnosis not present

## 2023-07-16 DIAGNOSIS — A419 Sepsis, unspecified organism: Secondary | ICD-10-CM | POA: Diagnosis not present

## 2023-07-16 LAB — CBC
HCT: 31.7 % — ABNORMAL LOW (ref 36.0–46.0)
Hemoglobin: 9.7 g/dL — ABNORMAL LOW (ref 12.0–15.0)
MCH: 26.2 pg (ref 26.0–34.0)
MCHC: 30.6 g/dL (ref 30.0–36.0)
MCV: 85.7 fL (ref 80.0–100.0)
Platelets: 155 10*3/uL (ref 150–400)
RBC: 3.7 MIL/uL — ABNORMAL LOW (ref 3.87–5.11)
RDW: 16.7 % — ABNORMAL HIGH (ref 11.5–15.5)
WBC: 4.2 10*3/uL (ref 4.0–10.5)
nRBC: 0 % (ref 0.0–0.2)

## 2023-07-16 LAB — COMPREHENSIVE METABOLIC PANEL WITH GFR
ALT: 11 U/L (ref 0–44)
AST: 19 U/L (ref 15–41)
Albumin: 2.8 g/dL — ABNORMAL LOW (ref 3.5–5.0)
Alkaline Phosphatase: 67 U/L (ref 38–126)
Anion gap: 11 (ref 5–15)
BUN: 7 mg/dL — ABNORMAL LOW (ref 8–23)
CO2: 24 mmol/L (ref 22–32)
Calcium: 9 mg/dL (ref 8.9–10.3)
Chloride: 101 mmol/L (ref 98–111)
Creatinine, Ser: 1.31 mg/dL — ABNORMAL HIGH (ref 0.44–1.00)
GFR, Estimated: 45 mL/min — ABNORMAL LOW (ref >60)
Glucose, Bld: 93 mg/dL (ref 70–99)
Potassium: 4.3 mmol/L (ref 3.5–5.1)
Sodium: 136 mmol/L (ref 135–145)
Total Bilirubin: 0.4 mg/dL (ref 0.0–1.2)
Total Protein: 6.5 g/dL (ref 6.5–8.1)

## 2023-07-16 LAB — GLUCOSE, CAPILLARY
Glucose-Capillary: 141 mg/dL — ABNORMAL HIGH (ref 70–99)
Glucose-Capillary: 71 mg/dL (ref 70–99)
Glucose-Capillary: 123 mg/dL — ABNORMAL HIGH (ref 70–99)
Glucose-Capillary: 170 mg/dL — ABNORMAL HIGH (ref 70–99)

## 2023-07-16 MED ORDER — SPIRONOLACTONE 25 MG PO TABS
50.0000 mg | ORAL_TABLET | Freq: Every day | ORAL | Status: DC
  Administered 2023-07-16 – 2023-07-17 (×2): 50 mg via ORAL
  Filled 2023-07-16 (×2): qty 2

## 2023-07-16 MED ORDER — FUROSEMIDE 10 MG/ML IJ SOLN
20.0000 mg | Freq: Once | INTRAMUSCULAR | Status: AC
  Administered 2023-07-16: 20 mg via INTRAVENOUS
  Filled 2023-07-16: qty 2

## 2023-07-16 NOTE — Plan of Care (Signed)
 Patient alert and oriented, as well as, forgetful. Occasionally talking out loud to herself. Frequent reminders provided to patient.  Active listening utilized during patient's verbalization.  Patient continues to frequently get out of the chair without assistance despite providing in depth explanations (constantly) to her. Fall precautions remains in place.  Call light remains within reach. Chair and bed alarm utilized.  See MAR for medications administered to patient. Problem: Education: Goal: Ability to describe self-care measures that may prevent or decrease complications (Diabetes Survival Skills Education) will improve Outcome: Progressing Goal: Individualized Educational Video(s) Outcome: Progressing   Problem: Coping: Goal: Ability to adjust to condition or change in health will improve Outcome: Progressing   Problem: Fluid Volume: Goal: Ability to maintain a balanced intake and output will improve Outcome: Progressing   Problem: Health Behavior/Discharge Planning: Goal: Ability to identify and utilize available resources and services will improve Outcome: Progressing Goal: Ability to manage health-related needs will improve Outcome: Progressing   Problem: Metabolic: Goal: Ability to maintain appropriate glucose levels will improve Outcome: Progressing   Problem: Nutritional: Goal: Maintenance of adequate nutrition will improve Outcome: Progressing Goal: Progress toward achieving an optimal weight will improve Outcome: Progressing   Problem: Skin Integrity: Goal: Risk for impaired skin integrity will decrease Outcome: Progressing   Problem: Tissue Perfusion: Goal: Adequacy of tissue perfusion will improve Outcome: Progressing   Problem: Education: Goal: Knowledge of General Education information will improve Description: Including pain rating scale, medication(s)/side effects and non-pharmacologic comfort measures Outcome: Progressing   Problem: Health  Behavior/Discharge Planning: Goal: Ability to manage health-related needs will improve Outcome: Progressing   Problem: Clinical Measurements: Goal: Ability to maintain clinical measurements within normal limits will improve Outcome: Progressing Goal: Will remain free from infection Outcome: Progressing Goal: Diagnostic test results will improve Outcome: Progressing Goal: Respiratory complications will improve Outcome: Progressing Goal: Cardiovascular complication will be avoided Outcome: Progressing   Problem: Activity: Goal: Risk for activity intolerance will decrease Outcome: Progressing   Problem: Nutrition: Goal: Adequate nutrition will be maintained Outcome: Progressing   Problem: Coping: Goal: Level of anxiety will decrease Outcome: Progressing   Problem: Elimination: Goal: Will not experience complications related to bowel motility Outcome: Progressing Goal: Will not experience complications related to urinary retention Outcome: Progressing   Problem: Pain Managment: Goal: General experience of comfort will improve and/or be controlled Outcome: Progressing   Problem: Safety: Goal: Ability to remain free from injury will improve Outcome: Progressing   Problem: Skin Integrity: Goal: Risk for impaired skin integrity will decrease Outcome: Progressing

## 2023-07-16 NOTE — Progress Notes (Signed)
 PROGRESS NOTE        PATIENT DETAILS Name: [name] Age: 68 y.o. Sex: female Date of Birth: 02/12/1955 Admit Date: 07/10/2023 Admitting Physician Leita SHAUNNA Gaskins, DO ERE:[id number], Erminio CROME, MD  Brief Summary: Patient is a 68 y.o.  female with history of VTE on Xarelto , cirrhosis/chronic HBV, chronic pain syndrome, iron deficiency anemia-who fell at home-hit her head (had taken 2-3 Xanax  in the morning)-she was brought to the ED where she was found to have septic shock secondary to pneumococcal pneumonia  Significant events: 6/17>> septic shock-admit to ICU  Significant studies: 6/17>> CT head: No acute intracranial abnormality 6/17>> CT C-spine: No evidence of acute traumatic injury 6/17>> CT maxillofacial: No acute traumatic injury. 6/17>> CT chest/abdomen/pelvis: No acute traumatic injury-multifocal parenchymal ill-defined opacities, chronic liver disease with nodular fatty liver.  Splenomegaly.  Significant microbiology data: 6/17>> respiratory virus panel: Negative 6/17>> COVID PCR: Negative 6/17>> blood culture: Negative 6/17>> urine pneumococcal antigen: Positive  Procedures: None  Consults: Trauma PCCM  Subjective: Overall better-still complains of productive cough-greenish phlegm-continues to complain that her breathing is not yet back to baseline.  On 2 L of oxygen .  Objective: Vitals: Blood pressure 103/77, pulse 87, temperature 98 F (36.7 C), temperature source Oral, resp. rate 16, height 5' 4 (1.626 m), weight 110 kg, SpO2 94%.   Exam: Awake/alert Chest: Clear to auscultation bilaterally-few bibasilar rales CVS: S1-S2 regular Extremities: Trace edema Neurology: Nonfocal  Pertinent Labs/Radiology:    Latest Ref Rng & Units 07/16/2023    5:52 AM 07/13/2023    7:29 AM 07/12/2023    4:27 AM  CBC  WBC 4.0 - 10.5 K/uL 4.2  3.5  5.5   Hemoglobin 12.0 - 15.0 g/dL 9.7  8.2  8.1   Hematocrit 36.0 - 46.0 % 31.7  26.7  26.2    Platelets 150 - 400 K/uL 155  111  113     Lab Results  Component Value Date   NA 136 07/16/2023   K 4.3 07/16/2023   CL 101 07/16/2023   CO2 24 07/16/2023      Assessment/Plan: Septic shock secondary to pneumonia (urine streptococcal antigen positive) Sepsis physiology has resolved-did require vasopressors while in the ICU. Culture data as above Remains on IV Rocephin  Change midodrine  to 2.5 mg-with holding parameters.   Acute hypoxic respiratory failure Secondary pneumonia Improved-attempt to liberate off oxygen  today.  Acute toxic metabolic encephalopathy Secondary to combination of Xanax /PNA/sepsis She was lethargic and confused on admission per H&P Currently clinically improved-completely awake and alert.  AKI on CKD 2 Hemodynamically mediated in the setting of sepsis AKI has resolved-creatinine back to baseline  Cirrhosis Chronic HBV infection Stable-but has some lower extremity edema today Follow-up with GI as an outpatient Tenofovir  has been resumed IV Lasix  x 1 dose Resume Aldactone .  PAF Telemetry monitoring On Coreg  Already on anticoagulation  Normocytic anemia Likely secondary to acute illness-no evidence of blood loss Follow CBC periodically  Thrombocytopenia Mild Likely secondary to sepsis Follow CBC.  History of VTE Previously on Xarelto -has been switched to Eliquis   Mechanical fall Pan CT imaging was negative-no further recommendations by trauma  Chronic pain Chronic neuropathy Continue gabapentin  As needed oxycodone .  History of bipolar disorder stable Continue Depakote .  COPD Not in exacerbation Continue bronchodilators  Multiple cystic foci including 1 lesion along the left kidney which is slightly larger  from previous scans Incidental finding-stable for dedicated MRI as an outpatient.  Deconditioning/debility Due to acute illness PT/OT eval-SNF recommended-but per social worker note on 6/20-patient/family will decline  SNF and will rather go home with home health services.  Will re-discuss with social worker today.  Await repeat PT/OT re-eval  Morbid Obesity: Estimated body mass index is 41.63 kg/m as calculated from the following:   Height as of this encounter: 5' 4 (1.626 m).   Weight as of this encounter: 110 kg.   Code status:   Code Status: Full Code   DVT Prophylaxis: SCDs Start: 07/10/23 1154 apixaban  (ELIQUIS ) tablet 5 mg    Family Communication: Daughter-Logan Galster-[phone]-called 6/23-no answer-unable to leave voicemail.   Disposition Plan: Status is: Inpatient Remains inpatient appropriate because: Severity of illness   Planned Discharge Destination:Skilled nursing facility versus home health.   Diet: Diet Order             Diet regular Room service appropriate? Yes with Assist; Fluid consistency: Thin  Diet effective now                     Antimicrobial agents: Anti-infectives (From admission, onward)    Start     Dose/Rate Route Frequency Ordered Stop   07/12/23 1015  tenofovir  alafenamide (VEMLIDY ) tablet 25 mg        25 mg Oral Daily 07/12/23 0915     07/11/23 1000  cefTRIAXone  (ROCEPHIN ) 2 g in sodium chloride  0.9 % 100 mL IVPB        2 g 200 mL/hr over 30 Minutes Intravenous Every 24 hours 07/11/23 0906 07/18/23 0959   07/10/23 2200  cefTRIAXone  (ROCEPHIN ) 2 g in sodium chloride  0.9 % 100 mL IVPB  Status:  Discontinued        2 g 200 mL/hr over 30 Minutes Intravenous Every 24 hours 07/10/23 1206 07/10/23 1241   07/10/23 1530  entecavir  (BARACLUDE ) tablet 0.5 mg  Status:  Discontinued        0.5 mg Oral Every other day 07/10/23 1433 07/12/23 0915   07/10/23 1432  valACYclovir (VALTREX) tablet 500 mg        500 mg Oral Daily PRN 07/10/23 1433     07/10/23 1300  piperacillin -tazobactam (ZOSYN ) IVPB 3.375 g  Status:  Discontinued        3.375 g 12.5 mL/hr over 240 Minutes Intravenous Every 8 hours 07/10/23 1247 07/11/23 0906   07/10/23 1215  doxycycline   (VIBRAMYCIN ) 100 mg in sodium chloride  0.9 % 250 mL IVPB  Status:  Discontinued        100 mg 125 mL/hr over 120 Minutes Intravenous Every 12 hours 07/10/23 1206 07/11/23 0906   07/10/23 1030  Ampicillin -Sulbactam (UNASYN ) 3 g in sodium chloride  0.9 % 100 mL IVPB        3 g 200 mL/hr over 30 Minutes Intravenous  Once 07/10/23 1016 07/10/23 1123        MEDICATIONS: Scheduled Meds:  albuterol   2.5 mg Nebulization BID   apixaban   5 mg Oral BID   Chlorhexidine  Gluconate Cloth  6 each Topical Daily   cholecalciferol   2,000 Units Oral Daily   divalproex   500 mg Oral Daily   doxepin   100 mg Oral QHS   famotidine   20 mg Oral BID   ferrous sulfate   325 mg Oral Daily   fluticasone  furoate-vilanterol  1 puff Inhalation Daily   gabapentin   300 mg Oral TID   guaiFENesin   600 mg Oral BID  insulin  aspart  0-5 Units Subcutaneous QHS   insulin  aspart  0-6 Units Subcutaneous TID WC   midodrine   5 mg Oral Q8H   montelukast   10 mg Oral Daily   mouth rinse  15 mL Mouth Rinse 4 times per day   pantoprazole   40 mg Oral BID AC   spironolactone   50 mg Oral Daily   tamsulosin   0.4 mg Oral Daily   tenofovir  alafenamide  25 mg Oral Daily   Continuous Infusions:  cefTRIAXone  (ROCEPHIN )  IV 2 g (07/15/23 1002)   PRN Meds:.albuterol , ALPRAZolam , alum & mag hydroxide-simeth, docusate sodium , flavoxATE, ondansetron  (ZOFRAN ) IV, mouth rinse, oxyCODONE , polyethylene glycol, valACYclovir   I have personally reviewed following labs and imaging studies  LABORATORY DATA: CBC: Recent Labs  Lab 07/10/23 0929 07/10/23 0935 07/10/23 1013 07/11/23 0440 07/12/23 0427 07/13/23 0729 07/16/23 0552  WBC 11.5*  --   --  11.4* 5.5 3.5* 4.2  HGB 10.4*   < > 10.9* 8.5* 8.1* 8.2* 9.7*  HCT 34.6*   < > 32.0* 28.6* 26.2* 26.7* 31.7*  MCV 85.4  --   --  84.9 83.2 83.4 85.7  PLT 170  --   --  133* 113* 111* 155   < > = values in this interval not displayed.    Basic Metabolic Panel: Recent Labs  Lab  07/10/23 0929 07/10/23 0935 07/10/23 1013 07/11/23 0440 07/12/23 0427 07/13/23 0729 07/16/23 0552  NA 134* 132* 133* 137 134* 137 136  K 4.5 4.5 4.5 3.8 4.2 4.2 4.3  CL 100 98  --  106 105 104 101  CO2 21*  --   --  22 24 27 24   GLUCOSE 92 90  --  106* 74 75 93  BUN 19 22  --  16 15 10  7*  CREATININE 2.14* 2.20*  --  1.48* 1.30* 1.12* 1.31*  CALCIUM 9.4  --   --  8.2* 8.2* 8.0* 9.0  MG  --   --   --  1.6* 1.9  --   --   PHOS  --   --   --  3.2  --   --   --     GFR: Estimated Creatinine Clearance: 51.2 mL/min (A) (by C-G formula based on SCr of 1.31 mg/dL (H)).  Liver Function Tests: Recent Labs  Lab 07/10/23 0929 07/11/23 0440 07/12/23 0427 07/13/23 0729 07/16/23 0552  AST 23 35 34 23 19  ALT 14 16 17 16 11   ALKPHOS 79 59 54 57 67  BILITOT 0.7 0.7 0.6 0.6 0.4  PROT 6.2* 5.4* 5.1* 5.2* 6.5  ALBUMIN 2.9* 2.3* 2.2* 2.2* 2.8*   No results for input(s): LIPASE, AMYLASE in the last 168 hours. Recent Labs  Lab 07/10/23 1007  AMMONIA <13    Coagulation Profile: Recent Labs  Lab 07/10/23 0929  INR 2.3*    Cardiac Enzymes: No results for input(s): CKTOTAL, CKMB, CKMBINDEX, TROPONINI in the last 168 hours.  BNP (last 3 results) No results for input(s): PROBNP in the last 8760 hours.  Lipid Profile: No results for input(s): CHOL, HDL, LDLCALC, TRIG, CHOLHDL, LDLDIRECT in the last 72 hours.  Thyroid Function Tests: No results for input(s): TSH, T4TOTAL, FREET4, T3FREE, THYROIDAB in the last 72 hours.  Anemia Panel: No results for input(s): VITAMINB12, FOLATE, FERRITIN, TIBC, IRON, RETICCTPCT in the last 72 hours.   Urine analysis:    Component Value Date/Time   COLORURINE YELLOW 07/10/2023 1625   APPEARANCEUR CLEAR 07/10/2023 1625  LABSPEC 1.020 07/10/2023 1625   PHURINE 6.0 07/10/2023 1625   GLUCOSEU NEGATIVE 07/10/2023 1625   HGBUR SMALL (A) 07/10/2023 1625   BILIRUBINUR NEGATIVE 07/10/2023 1625    KETONESUR NEGATIVE 07/10/2023 1625   PROTEINUR NEGATIVE 07/10/2023 1625   UROBILINOGEN 1.0 04/03/2014 1645   NITRITE NEGATIVE 07/10/2023 1625   LEUKOCYTESUR NEGATIVE 07/10/2023 1625    Sepsis Labs: Lactic Acid, Venous    Component Value Date/Time   LATICACIDVEN 3.3 (HH) 07/10/2023 1224    MICROBIOLOGY: Recent Results (from the past 240 hours)  Blood culture (routine x 2)     Status: None   Collection Time: 07/10/23 10:40 AM   Specimen: BLOOD RIGHT WRIST  Result Value Ref Range Status   Specimen Description BLOOD RIGHT WRIST  Final   Special Requests   Final    BOTTLES DRAWN AEROBIC AND ANAEROBIC Blood Culture results may not be optimal due to an inadequate volume of blood received in culture bottles   Culture   Final    NO GROWTH 5 DAYS Performed at [facility], [address], [place], [place] [address]    Report Status 07/15/2023 FINAL  Final  Respiratory (~20 pathogens) panel by PCR     Status: None   Collection Time: 07/10/23 11:39 AM   Specimen: Nasopharyngeal Swab; Respiratory  Result Value Ref Range Status   Adenovirus NOT DETECTED NOT DETECTED Final   Coronavirus 229E NOT DETECTED NOT DETECTED Final    Comment: (NOTE) The Coronavirus on the Respiratory Panel, DOES NOT test for the novel  Coronavirus (2019 nCoV)    Coronavirus HKU1 NOT DETECTED NOT DETECTED Final   Coronavirus NL63 NOT DETECTED NOT DETECTED Final   Coronavirus OC43 NOT DETECTED NOT DETECTED Final   Metapneumovirus NOT DETECTED NOT DETECTED Final   Rhinovirus / Enterovirus NOT DETECTED NOT DETECTED Final   Influenza A NOT DETECTED NOT DETECTED Final   Influenza B NOT DETECTED NOT DETECTED Final   Parainfluenza Virus 1 NOT DETECTED NOT DETECTED Final   Parainfluenza Virus 2 NOT DETECTED NOT DETECTED Final   Parainfluenza Virus 3 NOT DETECTED NOT DETECTED Final   Parainfluenza Virus 4 NOT DETECTED NOT DETECTED Final   Respiratory Syncytial Virus NOT DETECTED NOT DETECTED Final   Bordetella  pertussis NOT DETECTED NOT DETECTED Final   Bordetella Parapertussis NOT DETECTED NOT DETECTED Final   Chlamydophila pneumoniae NOT DETECTED NOT DETECTED Final   Mycoplasma pneumoniae NOT DETECTED NOT DETECTED Final    Comment: Performed at [facility], [address] [place], [place] [address]  SARS Coronavirus 2 by RT PCR (hospital order, performed in [facility] hospital lab) *cepheid single result test*     Status: None   Collection Time: 07/10/23 11:39 AM  Result Value Ref Range Status   SARS Coronavirus 2 by RT PCR NEGATIVE NEGATIVE Final    Comment: Performed at [facility], [address], [place], [place] [address]  MRSA Next Gen by PCR, Nasal     Status: None   Collection Time: 07/10/23 12:05 PM  Result Value Ref Range Status   MRSA by PCR Next Gen NOT DETECTED NOT DETECTED Final    Comment: (NOTE) The GeneXpert MRSA Assay (FDA approved for NASAL specimens only), is one component of a comprehensive MRSA colonization surveillance program. It is not intended to diagnose MRSA infection nor to guide or monitor treatment for MRSA infections. Test performance is not FDA approved in patients less than 55 years old. Performed at [facility]  [facility], [address] [place], [place] [address]   Culture, blood (single)     Status: None   Collection Time: 07/10/23  2:52 PM   Specimen: BLOOD  Result Value Ref Range Status   Specimen Description BLOOD SITE NOT SPECIFIED  Final   Special Requests   Final    BOTTLES DRAWN AEROBIC AND ANAEROBIC Blood Culture adequate volume   Culture   Final    NO GROWTH 5 DAYS Performed at [facility], [address] [place], [place] [address]    Report Status 07/15/2023 FINAL  Final  Blood culture (routine x 2)     Status: None   Collection Time: 07/10/23  2:54 PM   Specimen: BLOOD  Result Value Ref Range Status   Specimen Description BLOOD SITE NOT SPECIFIED  Final   Special Requests   Final    BOTTLES DRAWN AEROBIC AND  ANAEROBIC Blood Culture adequate volume   Culture   Final    NO GROWTH 5 DAYS Performed at [facility], [address], [place], [place] [address]    Report Status 07/15/2023 FINAL  Final    RADIOLOGY STUDIES/RESULTS: No results found.   LOS: 6 days   Donalda Applebaum, MD  [facility]    To contact the attending provider between 7A-7P or the covering provider during after hours 7P-7A, please log into the web site [email or web address] and access using universal [place] password for that web site. If you do not have the password, please call the hospital operator.  07/16/2023, 9:04 AM

## 2023-07-16 NOTE — Progress Notes (Addendum)
 Physical Therapy Treatment Patient Details Name: [name] MRN: [id number] DOB: 02-Sep-1955 Today's Date: 07/16/2023   History of Present Illness 68 y/o woman with a history of PE on Xarelto  who presented with fall at home hitting her head, on xarelto . She had taken 2-3 Xanax  this morning. With EMS she had concern for aspiration and dehydration. In the ED she was hypotensive, given IVF with ongoing MAPs in the 40s requiring vasopressors to be started peripherally, transferred to ICU for treatment of septic shock, multilobar PNA, acute respiratory failure, acute encephalopathy, AKI on CKD 2PMH: PE, DVT on Xarelto , cirrhosis, chronic benzo use, HTN, HBV, GERD, Bipolar disorder, peripheral neuropathy    PT Comments  Pt making good progress with mobility although still impulsive and lacks safety awareness. Did not require supplemental O2. Pt/family declined post acute therapies. Will continue to follow acutely.     If plan is discharge home, recommend the following: A little help with bathing/dressing/bathroom;Assist for transportation;Direct supervision/assist for medications management;Assistance with cooking/housework;A little help with walking and/or transfers   Can travel by private vehicle     Yes  Equipment Recommendations  BSC/3in1    Recommendations for Other Services       Precautions / Restrictions Precautions Precautions: Fall Recall of Precautions/Restrictions: Impaired Restrictions Weight Bearing Restrictions Per Provider Order: No     Mobility  Bed Mobility               General bed mobility comments: Pt OOB    Transfers Overall transfer level: Needs assistance Equipment used: Rollator (4 wheels) Transfers: Sit to/from Stand Sit to Stand: Supervision           General transfer comment: Verbal cues for hand placement    Ambulation/Gait Ambulation/Gait assistance: Supervision Gait Distance (Feet): 150 Feet Assistive device: Rollator (4  wheels) Gait Pattern/deviations: Step-through pattern, Decreased stride length Gait velocity: decreased Gait velocity interpretation: 1.31 - 2.62 ft/sec, indicative of limited community ambulator   General Gait Details: Assist for safety   Stairs             Wheelchair Mobility     Tilt Bed    Modified Rankin (Stroke Patients Only)       Balance Overall balance assessment: Needs assistance Sitting-balance support: No upper extremity supported, Feet supported Sitting balance-Leahy Scale: Good     Standing balance support: No upper extremity supported, During functional activity Standing balance-Leahy Scale: Fair                              Hotel manager: No apparent difficulties  Cognition Arousal: Alert Behavior During Therapy: Impulsive   PT - Cognitive impairments: Awareness, Memory, Attention, Safety/Judgement                         Following commands: Impaired Following commands impaired: Follows multi-step commands inconsistently    Cueing Cueing Techniques: Verbal cues, Tactile cues  Exercises      General Comments General comments (skin integrity, edema, etc.): VSS on RA. SpO2 90% with amb      Pertinent Vitals/Pain Pain Assessment Pain Assessment: Faces Faces Pain Scale: Hurts little more Pain Location: Rt knee Pain Descriptors / Indicators: Sore, Grimacing Pain Intervention(s): Limited activity within patient's tolerance, Monitored during session    Home Living  Prior Function            PT Goals (current goals can now be found in the care plan section) Acute Rehab PT Goals PT Goal Formulation: With patient Time For Goal Achievement: 07/30/23 Potential to Achieve Goals: Good Progress towards PT goals: Goals updated    Frequency    Min 2X/week      PT Plan      Co-evaluation              AM-PAC PT 6 Clicks Mobility   Outcome  Measure  Help needed turning from your back to your side while in a flat bed without using bedrails?: None Help needed moving from lying on your back to sitting on the side of a flat bed without using bedrails?: None Help needed moving to and from a bed to a chair (including a wheelchair)?: A Little Help needed standing up from a chair using your arms (e.g., wheelchair or bedside chair)?: A Little Help needed to walk in hospital room?: A Little Help needed climbing 3-5 steps with a railing? : A Little 6 Click Score: 20    End of Session   Activity Tolerance: Patient tolerated treatment well Patient left: in chair;with call bell/phone within reach;with chair alarm set Nurse Communication: Mobility status PT Visit Diagnosis: Other abnormalities of gait and mobility (R26.89);Muscle weakness (generalized) (M62.81);Unsteadiness on feet (R26.81);History of falling (Z91.81);Difficulty in walking, not elsewhere classified (R26.2);Pain Pain - Right/Left: Right Pain - part of body: Knee     Time: 1530-1601 PT Time Calculation (min) (ACUTE ONLY): 31 min  Charges:    $Gait Training: 23-37 mins PT General Charges $$ ACUTE PT VISIT: 1 Visit                     [facility] PT Acute Rehabilitation Services Office [phone]    [facility] ORN [facility] 07/16/2023, 4:36 PM

## 2023-07-16 NOTE — Care Management Important Message (Signed)
 Important Message  Patient Details  Name: [name] MRN: [id number] Date of Birth: 09/03/1955   Important Message Given:  Yes - Medicare IM     Jon Cruel 07/16/2023, 2:59 PM

## 2023-07-16 NOTE — Plan of Care (Signed)
 Pt has rested quietly throughout the night with no distress noted. Alert and oriented but forgetful. On O2 2LNC. SR on the monitor. Up to [facility] to void. Pt gets in and out of bed to chair and back again frequently. Medicated for pain twice with relief noted. No other complaints voiced.     Problem: Tissue Perfusion: Goal: Adequacy of tissue perfusion will improve Outcome: Progressing   Problem: Education: Goal: Knowledge of General Education information will improve Description: Including pain rating scale, medication(s)/side effects and non-pharmacologic comfort measures Outcome: Progressing   Problem: Clinical Measurements: Goal: Respiratory complications will improve Outcome: Progressing Goal: Cardiovascular complication will be avoided Outcome: Progressing   Problem: Coping: Goal: Level of anxiety will decrease Outcome: Progressing   Problem: Pain Managment: Goal: General experience of comfort will improve and/or be controlled Outcome: Progressing

## 2023-07-16 NOTE — TOC CM/SW Note (Signed)
    Durable Medical Equipment  (From admission, onward)           Start     Ordered   07/16/23 1323  For home use only DME Bedside commode  Once       Question:  Patient needs a bedside commode to treat with the following condition  Answer:  Decreased strength   07/16/23 1322           Patient is confined to a room with no bathroom access

## 2023-07-16 NOTE — Progress Notes (Signed)
 SATURATION QUALIFICATIONS: (This note is used to comply with regulatory documentation for home oxygen )  Patient Saturations on Room Air at Rest = 93%  Patient Saturations on Room Air while Ambulating = 90%  Patient Saturations on N/A Liters of oxygen  while Ambulating = N/A  Please briefly explain why patient needs home oxygen : Did not require O2.  [facility] PT Acute Rehabilitation Services Office [phone]

## 2023-07-16 NOTE — Plan of Care (Signed)
 Patient getting out of the chair without assistance.  Chair alarm sounding.  Assisted patient to [facility] and then to the bed.  Bed alarm set.  Fall mats on floor.  Charge nurse informed of patient not calling for assistance and frequently getting out the chair. Problem: Education: Goal: Ability to describe self-care measures that may prevent or decrease complications (Diabetes Survival Skills Education) will improve 07/16/2023 1353 by Tahni Porchia , Madeline PARAS, RN Outcome: Progressing 07/16/2023 1345 by Janssen Zee , Madeline PARAS, RN Outcome: Progressing Goal: Individualized Educational Video(s) 07/16/2023 1353 by Terralyn Matsumura , Madeline PARAS, RN Outcome: Progressing 07/16/2023 1345 by Holden Maniscalco , Madeline PARAS, RN Outcome: Progressing   Problem: Coping: Goal: Ability to adjust to condition or change in health will improve 07/16/2023 1353 by Mahamed Zalewski , Madeline PARAS, RN Outcome: Progressing 07/16/2023 1345 by Marijayne Rauth , Madeline PARAS, RN Outcome: Progressing   Problem: Fluid Volume: Goal: Ability to maintain a balanced intake and output will improve 07/16/2023 1353 by Nasri Boakye , Madeline PARAS, RN Outcome: Progressing 07/16/2023 1345 by Tahlia Deamer , Madeline PARAS, RN Outcome: Progressing   Problem: Health Behavior/Discharge Planning: Goal: Ability to identify and utilize available resources and services will improve 07/16/2023 1353 by Jamesetta Greenhalgh , Madeline PARAS, RN Outcome: Progressing 07/16/2023 1345 by Hillis Mcphatter , Madeline PARAS, RN Outcome: Progressing Goal: Ability to manage health-related needs will improve 07/16/2023 1353 by Graviela Nodal , Madeline PARAS, RN Outcome: Progressing 07/16/2023 1345 by Alannie Amodio , Madeline PARAS, RN Outcome: Progressing   Problem: Metabolic: Goal: Ability to maintain appropriate glucose levels will improve 07/16/2023 1353 by Loxley Cibrian , Madeline PARAS, RN Outcome: Progressing 07/16/2023 1345 by Annie Roseboom , Madeline PARAS, RN Outcome: Progressing   Problem: Nutritional: Goal: Maintenance of adequate nutrition will improve 07/16/2023 1353 by  Carlye Panameno , Madeline PARAS, RN Outcome: Progressing 07/16/2023 1345 by Devine Dant , Madeline PARAS, RN Outcome: Progressing Goal: Progress toward achieving an optimal weight will improve 07/16/2023 1353 by Evonte Prestage , Madeline PARAS, RN Outcome: Progressing 07/16/2023 1345 by Johnay Mano , Madeline PARAS, RN Outcome: Progressing   Problem: Skin Integrity: Goal: Risk for impaired skin integrity will decrease 07/16/2023 1353 by Tanina Barb , Madeline PARAS, RN Outcome: Progressing 07/16/2023 1345 by Arvetta Araque , Madeline PARAS, RN Outcome: Progressing   Problem: Tissue Perfusion: Goal: Adequacy of tissue perfusion will improve 07/16/2023 1353 by Julia Alkhatib , Madeline PARAS, RN Outcome: Progressing 07/16/2023 1345 by Jannah Guardiola , Madeline PARAS, RN Outcome: Progressing   Problem: Education: Goal: Knowledge of General Education information will improve Description: Including pain rating scale, medication(s)/side effects and non-pharmacologic comfort measures 07/16/2023 1353 by Adrinne Sze , Madeline PARAS, RN Outcome: Progressing 07/16/2023 1345 by Vivyan Biggers , Madeline PARAS, RN Outcome: Progressing   Problem: Health Behavior/Discharge Planning: Goal: Ability to manage health-related needs will improve 07/16/2023 1353 by Lacy Taglieri , Madeline PARAS, RN Outcome: Progressing 07/16/2023 1345 by Laurine Madeline PARAS, RN Outcome: Progressing   Problem: Clinical Measurements: Goal: Ability to maintain clinical measurements within normal limits will improve 07/16/2023 1353 by Augie Vane , Madeline PARAS, RN Outcome: Progressing 07/16/2023 1345 by Willamina Grieshop , Madeline PARAS, RN Outcome: Progressing Goal: Will remain free from infection 07/16/2023 1353 by Emmelyn Schmale , Madeline PARAS, RN Outcome: Progressing 07/16/2023 1345 by Antwain Caliendo , Madeline PARAS, RN Outcome: Progressing Goal: Diagnostic test results will improve 07/16/2023 1353 by Rhyland Hinderliter , Madeline PARAS, RN Outcome: Progressing 07/16/2023 1345 by Dylana Shaw , Madeline PARAS, RN Outcome: Progressing Goal: Respiratory complications will improve 07/16/2023 1353 by  Zenovia Justman , Madeline PARAS, RN Outcome: Progressing 07/16/2023 1345 by Traver Meckes , Madeline PARAS, RN Outcome: Progressing Goal: Cardiovascular complication will be avoided 07/16/2023 1353 by Danae Oland , Madeline PARAS, RN Outcome: Progressing 07/16/2023 1345 by Chelby Salata , Madeline PARAS, RN Outcome: Progressing   Problem: Activity: Goal: Risk for activity intolerance will decrease  07/16/2023 1353 by Orlen Leedy , Madeline PARAS, RN Outcome: Progressing 07/16/2023 1345 by Joliana Claflin , Madeline PARAS, RN Outcome: Progressing   Problem: Nutrition: Goal: Adequate nutrition will be maintained 07/16/2023 1353 by Haruki Arnold , Madeline PARAS, RN Outcome: Progressing 07/16/2023 1345 by Jennavieve Arrick , Madeline PARAS, RN Outcome: Progressing   Problem: Coping: Goal: Level of anxiety will decrease 07/16/2023 1353 by Purvi Ruehl , Madeline PARAS, RN Outcome: Progressing 07/16/2023 1345 by Alexanderjames Berg , Madeline PARAS, RN Outcome: Progressing   Problem: Elimination: Goal: Will not experience complications related to bowel motility 07/16/2023 1353 by Shayra Anton , Madeline PARAS, RN Outcome: Progressing 07/16/2023 1345 by Wilsie Kern , Madeline PARAS, RN Outcome: Progressing Goal: Will not experience complications related to urinary retention 07/16/2023 1353 by Sheryll Dymek , Madeline PARAS, RN Outcome: Progressing 07/16/2023 1345 by Dierks Wach , Madeline PARAS, RN Outcome: Progressing   Problem: Pain Managment: Goal: General experience of comfort will improve and/or be controlled 07/16/2023 1353 by Genesys Coggeshall , Madeline PARAS, RN Outcome: Progressing 07/16/2023 1345 by Meital Riehl , Madeline PARAS, RN Outcome: Progressing   Problem: Safety: Goal: Ability to remain free from injury will improve 07/16/2023 1353 by Beronica Lansdale , Madeline PARAS, RN Outcome: Progressing 07/16/2023 1345 by Philopater Mucha , Madeline PARAS, RN Outcome: Progressing   Problem: Skin Integrity: Goal: Risk for impaired skin integrity will decrease 07/16/2023 1353 by Akylah Hascall , Madeline PARAS, RN Outcome: Progressing 07/16/2023 1345 by Brysten Reister , Madeline PARAS, RN Outcome:  Progressing

## 2023-07-16 NOTE — TOC Progression Note (Addendum)
 Transition of Care [facility]) - Progression Note    Patient Details  Name: [name] MRN: [id number] Date of Birth: 11-23-1955  [facility]) CM/SW Contact  Hendricks KANDICE Her, RN Phone Number: 07/16/2023, 12:02 PM  Clinical Narrative:    UPDATE  1:16 PM RNCM spoke with Patient. Patient would like to decline HH at this time. RNCM had reached out to [facility] (Patient has had in the past) but [facility] stated they could not accept due to payer mix. Patient would line [facility] and [facility] will deliver bedside. TOC will continue to follow patient for any additional discharge needs      RNCM attempted to speak to patient at bedside. She told me she'd have to talk to ne another time-she's talking to her Daughter in [place] .  RNCM will return another time to discuss recommended HH and DME ([facility] Rolling walker)   TOC will  follow up      Expected Discharge Plan: Skilled Nursing Facility Barriers to Discharge: Continued Medical Work up  Expected Discharge Plan and Services In-house Referral: Clinical Social Work     Living arrangements for the past 2 months: Single Family Home                                       Social Determinants of Health (SDOH) Interventions SDOH Screenings   Food Insecurity: No Food Insecurity (06/27/2023)   Received from [facility]  Recent Concern: Food Insecurity - Food Insecurity Present (04/16/2023)   Received from [facility]  Housing: Low Risk  (06/27/2023)   Received from [facility]  Transportation Needs: No Transportation Needs (06/27/2023)   Received from [facility]  Utilities: Not At Risk (06/27/2023)   Received from [facility]  Financial Resource Strain: Low Risk  (06/27/2023)   Received from [facility]  Physical Activity: Unknown (06/27/2023)   Received from [facility]  Social Connections: Socially Integrated (06/27/2023)   Received from [facility]  Stress: No Stress Concern Present (06/27/2023)   Received from [facility]  Tobacco Use: Medium Risk (07/10/2023)    Readmission Risk Interventions    08/24/2022   11:55 AM 06/26/2022   12:01 PM  Readmission Risk Prevention Plan  Transportation Screening Complete Complete  PCP or Specialist Appt within 5-7 Days  Complete  Home Care Screening  Complete  Medication Review (RN CM)  Complete  HRI or Home Care Consult Complete   SW Recovery Care/Counseling Consult Complete

## 2023-07-17 ENCOUNTER — Other Ambulatory Visit (HOSPITAL_COMMUNITY): Payer: Self-pay

## 2023-07-17 DIAGNOSIS — K7460 Unspecified cirrhosis of liver: Secondary | ICD-10-CM | POA: Diagnosis not present

## 2023-07-17 DIAGNOSIS — A419 Sepsis, unspecified organism: Secondary | ICD-10-CM | POA: Diagnosis not present

## 2023-07-17 DIAGNOSIS — K7682 Hepatic encephalopathy: Secondary | ICD-10-CM | POA: Diagnosis not present

## 2023-07-17 DIAGNOSIS — J690 Pneumonitis due to inhalation of food and vomit: Secondary | ICD-10-CM | POA: Diagnosis not present

## 2023-07-17 LAB — GLUCOSE, CAPILLARY: Glucose-Capillary: 98 mg/dL (ref 70–99)

## 2023-07-17 MED ORDER — APIXABAN 5 MG PO TABS
5.0000 mg | ORAL_TABLET | Freq: Two times a day (BID) | ORAL | 1 refills | Status: AC
  Filled 2023-07-17: qty 60, 30d supply, fill #0

## 2023-07-17 NOTE — TOC Transition Note (Signed)
 Transition of Care [facility]) - Discharge Note   Patient Details  Name: [name] MRN: [id number] Date of Birth: 1955/05/22  Transition [facility]) CM/SW Contact:  Hendricks KANDICE Her, RN Phone Number: 07/17/2023, 9:11 AM   Clinical Narrative:    Patient to DC to home today. [facility] has een delivered to bedside from [facility]. Patient declining HH.  Patient states one of her Nieces will transport her home  No additional TOC needs     Barriers to Discharge: Continued Medical Work up   Patient Goals and CMS Choice            Discharge Placement                       Discharge Plan and Services Additional resources added to the After Visit Summary for   In-house Referral: Clinical Social Work                                   Social Drivers of Health (SDOH) Interventions SDOH Screenings   Food Insecurity: No Food Insecurity (06/27/2023)   Received from [facility]  Recent Concern: Food Insecurity - Food Insecurity Present (04/16/2023)   Received from [facility]  Housing: Low Risk  (06/27/2023)   Received from [facility]  Transportation Needs: No Transportation Needs (06/27/2023)   Received from [facility]  Utilities: Not At Risk (06/27/2023)   Received from [facility]  Financial Resource Strain: Low Risk  (06/27/2023)   Received from [facility]  Physical Activity: Unknown (06/27/2023)   Received from [facility]  Social Connections: Socially Integrated (06/27/2023)   Received from [facility]  Stress: No Stress Concern Present (06/27/2023)   Received from [facility]  Tobacco Use: Medium Risk (07/10/2023)     Readmission Risk Interventions    08/24/2022   11:55 AM 06/26/2022   12:01 PM  Readmission Risk Prevention Plan  Transportation Screening Complete Complete  PCP or Specialist Appt within 5-7 Days  Complete  Home Care Screening  Complete  Medication Review (RN CM)  Complete  HRI or Home Care Consult Complete   SW Recovery Care/Counseling  Consult Complete

## 2023-07-17 NOTE — Progress Notes (Signed)
 Called patient as we left # in 1 in room at discharge  patient grand son will pick up tomorrow

## 2023-07-17 NOTE — Progress Notes (Signed)
 DISCHARGE NOTE HOME Courtney MICHALLE RADEMAKER to be discharged Home per MD order. Discussed prescriptions and follow up appointments with the patient. Prescriptions given to patient; medication list explained in detail. Patient verbalized understanding.  Skin clean, dry and intact without evidence of skin break down, no evidence of skin tears noted. IV catheter discontinued intact. Site without signs and symptoms of complications. Dressing and pressure applied. Pt denies pain at the site currently. No complaints noted.  Patient free of lines, drains, and wounds.   An After Visit Summary (AVS) was printed and given to the patient. Patient escorted via wheelchair, and discharged home via private auto.  Peyton SHAUNNA Pepper, RN

## 2023-07-17 NOTE — Discharge Summary (Signed)
 PATIENT DETAILS Name: [name] Age: 68 y.o. Sex: female Date of Birth: [birth year] MRN: [id number]. Admitting Physician: Leita SHAUNNA Gaskins, DO ERE:[id number], Erminio CROME, MD  Admit Date: 07/10/2023 Discharge date: 07/17/2023  Recommendations for Outpatient Follow-up:  Follow up with PCP in 1-2 weeks Please obtain CMP/CBC in one week  Admitted From:  Home  Disposition: Home health   Discharge Condition: good  CODE STATUS:   Code Status: Full Code   Diet recommendation:  Diet Order             Diet - low sodium heart healthy           Diet regular Room service appropriate? Yes with Assist; Fluid consistency: Thin  Diet effective now                    Brief Summary: Patient is a 68 y.o.  female with history of VTE on Xarelto , cirrhosis/chronic HBV, chronic pain syndrome, iron deficiency anemia-who fell at home-hit her head (had taken 2-3 Xanax  in the morning)-she was brought to the ED where she was found to have septic shock secondary to pneumococcal pneumonia   Significant events: 6/17>> septic shock-admit to ICU   Significant studies: 6/17>> CT head: No acute intracranial abnormality 6/17>> CT C-spine: No evidence of acute traumatic injury 6/17>> CT maxillofacial: No acute traumatic injury. 6/17>> CT chest/abdomen/pelvis: No acute traumatic injury-multifocal parenchymal ill-defined opacities, chronic liver disease with nodular fatty liver.  Splenomegaly.   Significant microbiology data: 6/17>> respiratory virus panel: Negative 6/17>> COVID PCR: Negative 6/17>> blood culture: Negative 6/17>> urine pneumococcal antigen: Positive   Procedures: None   Consults: Trauma PCCM  Brief Hospital Course: Septic shock secondary to pneumonia (urine streptococcal antigen positive) Sepsis physiology has resolved-did require vasopressors while in the ICU. Culture data as above Remains on IV Rocephin  No longer on midodrine -BP stable.   Acute hypoxic respiratory  failure Secondary pneumonia Has been liberated off oxygen  See PT note from 6/23-does not require oxygen  even during ambulation.   Acute toxic metabolic encephalopathy Secondary to combination of Xanax /PNA/sepsis She was lethargic and confused on admission per H&P Currently clinically improved-completely awake and alert. PCP to consider minimizing-tapering Xanax  in the outpatient setting.   AKI on CKD 2 Hemodynamically mediated in the setting of sepsis AKI has resolved-creatinine back to baseline   Cirrhosis Chronic HBV infection Stable-but has some lower extremity edema today Follow-up with GI as an outpatient Tenofovir  has been resumed Resume Aldactone .   PAF On Coreg  Already on anticoagulation   Normocytic anemia Likely secondary to acute illness-no evidence of blood loss Follow CBC periodically   Thrombocytopenia Mild Likely secondary to sepsis Follow CBC.   History of VTE Previously on Xarelto -has been switched to Eliquis    Mechanical fall Pan CT imaging was negative-no further recommendations by trauma   Chronic pain Chronic neuropathy Continue gabapentin  Claims she has a upcoming appointment with a pain management MD-she was asking for narcotics on discharge-I will hold off on providing narcotics on discharge and have her follow-up with pain management MD.   History of bipolar disorder stable Continue Depakote .   COPD Not in exacerbation Continue bronchodilators   Multiple cystic foci including 1 lesion along the left kidney which is slightly larger from previous scans Incidental finding-stable for dedicated MRI as an outpatient.   Deconditioning/debility Due to acute illness PT/OT eval-SNF recommended-but per TOC-family refused-being discharged home with home health services.   Morbid Obesity: Estimated body mass index is 41.63 kg/m as  calculated from the following:   Height as of this encounter: 5' 4 (1.626 m).   Weight as of this encounter:  110 kg.   Discharge Diagnoses:  Principal Problem:   Septic shock [facility])   Discharge Instructions:  Activity:  As tolerated   Discharge Instructions     Call MD for:  difficulty breathing, headache or visual disturbances   Complete by: As directed    Call MD for:  persistant nausea and vomiting   Complete by: As directed    Diet - low sodium heart healthy   Complete by: As directed    Discharge instructions   Complete by: As directed    Follow with Primary MD  Gladystine Erminio CROME, MD in 1-2 weeks  Please get a complete blood count and chemistry panel checked by your Primary MD at your next visit, and again as instructed by your Primary MD.  Get Medicines reviewed and adjusted: Please take all your medications with you for your next visit with your Primary MD  Laboratory/radiological data: Please request your Primary MD to go over all hospital tests and procedure/radiological results at the follow up, please ask your Primary MD to get all Hospital records sent to his/her office.  In some cases, they will be blood work, cultures and biopsy results pending at the time of your discharge. Please request that your primary care M.D. follows up on these results.  Also Note the following: If you experience worsening of your admission symptoms, develop shortness of breath, life threatening emergency, suicidal or homicidal thoughts you must seek medical attention immediately by calling 911 or calling your MD immediately  if symptoms less severe.  You must read complete instructions/literature along with all the possible adverse reactions/side effects for all the Medicines you take and that have been prescribed to you. Take any new Medicines after you have completely understood and accpet all the possible adverse reactions/side effects.   Do not drive when taking Pain medications or sleeping medications (Benzodaizepines)  Do not take more than prescribed Pain, Sleep and Anxiety  Medications. It is not advisable to combine anxiety,sleep and pain medications without talking with your primary care practitioner  Special Instructions: If you have smoked or chewed Tobacco  in the last 2 yrs please stop smoking, stop any regular Alcohol  and or any Recreational drug use.  Wear Seat belts while driving.  Please note: You were cared for by a hospitalist during your hospital stay. Once you are discharged, your primary care physician will handle any further medical issues. Please note that NO REFILLS for any discharge medications will be authorized once you are discharged, as it is imperative that you return to your primary care physician (or establish a relationship with a primary care physician if you do not have one) for your post hospital discharge needs so that they can reassess your need for medications and monitor your lab values.   Increase activity slowly   Complete by: As directed       Allergies as of 07/17/2023       Reactions   Heparin  Hives   Pt states she cannot take because she will break out in hives   Amitriptyline  Hcl Other (See Comments)   Changes in mental status    Azithromycin  Rash   Fentanyl  Other (See Comments)   Change in mental status - tolerates low dose   Cymbalta [duloxetine Hcl] Other (See Comments)   Headaches   Neomycin-bacitracin Zn-polymyx Rash  Medication List     STOP taking these medications    Xarelto  20 MG Tabs tablet Generic drug: rivaroxaban        TAKE these medications    albuterol  108 (90 Base) MCG/ACT inhaler Commonly known as: ProAir  HFA INHALE 2 PUFFS INTO THE LUNGS EVERY 4 HOURS AS NEEDED FOR WHEEZING What changed:  how much to take how to take this when to take this reasons to take this additional instructions   albuterol  (2.5 MG/3ML) 0.083% nebulizer solution Commonly known as: PROVENTIL  Take 3 mLs (2.5 mg total) by nebulization every 4 (four) hours as needed for wheezing or shortness of  breath. What changed: when to take this   ALPRAZolam  1 MG tablet Commonly known as: XANAX  Take 1 tablet (1 mg total) by mouth 3 (three) times daily. What changed:  when to take this reasons to take this   apixaban  5 MG Tabs tablet Commonly known as: ELIQUIS  Take 1 tablet (5 mg total) by mouth 2 (two) times daily.   budesonide -formoterol  80-4.5 MCG/ACT inhaler Commonly known as: Symbicort  Inhale 2 puffs into the lungs in the morning and at bedtime.   carvedilol  12.5 MG tablet Commonly known as: COREG  Take 1 tablet (12.5 mg total) by mouth 2 (two) times daily with a meal.   clotrimazole -betamethasone  cream Commonly known as: LOTRISONE  Apply to affected area 2 times daily prn What changed:  how much to take how to take this when to take this reasons to take this additional instructions   divalproex  500 MG 24 hr tablet Commonly known as: DEPAKOTE  ER Take 500 mg by mouth every evening.   docusate sodium  100 MG capsule Commonly known as: COLACE Take 100 mg by mouth 2 (two) times daily.   doxepin  100 MG capsule Commonly known as: SINEQUAN  Take 100 mg by mouth at bedtime.   entecavir  0.5 MG tablet Commonly known as: BARACLUDE  Take 0.5 mg by mouth every other day.   famotidine  20 MG tablet Commonly known as: PEPCID  Take 20 mg by mouth 2 (two) times daily.   FeroSul 325 (65 FE) MG tablet Generic drug: ferrous sulfate  Take 325 mg by mouth daily.   gabapentin  300 MG capsule Commonly known as: NEURONTIN  Take 300-600 mg by mouth 3 (three) times daily.   levocetirizine 5 MG tablet Commonly known as: XYZAL Take 5 mg by mouth every evening.   montelukast  10 MG tablet Commonly known as: SINGULAIR  Take 10 mg by mouth daily.   nystatin powder Generic drug: nystatin Apply 1 Application topically as needed (yeast).   ondansetron  8 MG disintegrating tablet Commonly known as: ZOFRAN -ODT Take 8 mg by mouth every 8 (eight) hours as needed for nausea or vomiting.    pantoprazole  40 MG tablet Commonly known as: PROTONIX  Take 40 mg by mouth 2 (two) times daily before a meal.   spironolactone  50 MG tablet Commonly known as: ALDACTONE  Take 50 mg by mouth daily.   valACYclovir 500 MG tablet Commonly known as: VALTREX Take 500 mg by mouth daily as needed (breakout).   Vemlidy  25 MG tablet Generic drug: tenofovir  alafenamide Take 25 mg by mouth daily.   Vitamin D3 50 MCG (2000 UT) Tabs Take 2,000 Units by mouth daily.               Durable Medical Equipment  (From admission, onward)           Start     Ordered   07/16/23 1323  For home use only DME Bedside commode  Once       Question:  Patient needs a bedside commode to treat with the following condition  Answer:  Decreased strength   07/16/23 1322            Follow-up Information     Gladystine Erminio CROME, MD. Schedule an appointment as soon as possible for a visit in 1 week(s).   Specialty: Family Medicine Contact information: [address] [place] [address] [phone]                Allergies  Allergen Reactions   Heparin  Hives    Pt states she cannot take because she will break out in hives   Amitriptyline  Hcl Other (See Comments)    Changes in mental status    Azithromycin  Rash   Fentanyl  Other (See Comments)    Change in mental status - tolerates low dose   Cymbalta [Duloxetine Hcl] Other (See Comments)    Headaches    Neomycin-Bacitracin Zn-Polymyx Rash     Other Procedures/Studies: CT CHEST ABDOMEN PELVIS W CONTRAST Result Date: 07/10/2023 CLINICAL DATA:  Un witnessed fall.  On Xarelto .  Hypotensive EXAM: CT CHEST, ABDOMEN, AND PELVIS WITH CONTRAST TECHNIQUE: Multidetector CT imaging of the chest, abdomen and pelvis was performed following the standard protocol during bolus administration of intravenous contrast. RADIATION DOSE REDUCTION: This exam was performed according to the departmental dose-optimization program which  includes automated exposure control, adjustment of the mA and/or kV according to patient size and/or use of iterative reconstruction technique. CONTRAST:  85mL OMNIPAQUE  IOHEXOL  350 MG/ML SOLN COMPARISON:  CTA chest 05/05/2023 abdomen pelvis CT 08/24/2022. FINDINGS: CT CHEST FINDINGS Cardiovascular: Heart is nonenlarged. Trace pericardial fluid. The thoracic aorta is normal course and caliber with mild atherosclerotic plaque. There is pulsation artifact along the ascending aorta. No mediastinal hematoma. There is enlargement of the main and left pulmonary artery compared to right. Please correlate for any known history including pulmonary hypertension and potential pulmonary valve abnormality. Appearance is similar to previous. Mediastinum/Nodes: Preserved visualized portions of the thyroid gland. Slightly patulous thoracic esophagus. No abnormal lymph node enlargement identified in the axillary regions, hilum or mediastinum. Lungs/pleura: There is multifocal ill-defined parenchymal consolidative and ground-glass opacities. Most extensive in the left lower lobe but also significant areas in the right lower lobe, right upper lobe. Minimal in middle lobe and lingula. Please correlate for multifocal infiltrate. Please correlate for any evidence of aspiration or other process. Breathing motion. No pneumothorax or effusion. Musculoskeletal: Degenerative changes seen throughout the thoracic spine. Bridging endplate osteophytes along the lower thoracic spine. Multilevel Schmorl's node deformity with disc height loss. Degenerative changes also seen of the right shoulder. CT ABDOMEN PELVIS FINDINGS Hepatobiliary: Fatty nodular heterogeneous liver consistent with known chronic liver disease. No space-occupying liver lesion. Patent portal vein. Gallbladder is nondilated. Pancreas: Unremarkable. No pancreatic ductal dilatation or surrounding inflammatory changes. Spleen: Spleen is enlarged with a cephalocaudal length of 14.9  cm. Adrenals/Urinary Tract: Adrenal glands are preserved. Moderate bilateral renal atrophy with punctate nonobstructing stones bilaterally. Multiple cysts are again identified. Largest on the right previously measured 5.4 cm and today 5.7 cm. Hounsfield unit of 13. Bosniak 1 lesion. Other lesions are also relatively simple except for 1 lesion which is seen posterior along the left mid kidney measuring 18 mm. Hounsfield unit of 30 on portal venous phase and 31 on delay there is also larger from previous. Recommend dedicated workup when appropriate such as MRI. No collecting system dilatation. The ureters  have a normal course and caliber down to the bladder. Preserved contour to the urinary bladder. Stomach/Bowel: On this non oral contrast exam large bowel has a normal course and caliber. Scattered colonic stool. Normal appendix. Moderate debris in the stomach. Small bowel has a normal course and caliber. Few fluid-filled loops of bowel in the proximal small bowel, nonspecific. Vascular/Lymphatic: Normal caliber aorta and IVC. Scattered atherosclerotic changes. There is a focal stenosis seen of the celiac axis origin best seen on sagittal image 98. No specific abnormal lymph node enlargement identified in the abdomen and pelvis. Reproductive: Status post hysterectomy. No adnexal masses. Other: No free intra-air or free fluid. There is some skin thickening along the anterior pelvic wall with underlying fat stranding. This is increased from previous. Musculoskeletal: Curvature of the lumbar spine with degenerative changes of the spine and pelvis. Critical Value/emergent results were called by telephone at the time of interpretation on 07/10/2023 at 11:06 am to provider [facility] , who verbally acknowledged these results. IMPRESSION: Multifocal parenchymal ill-defined opacities to the lungs, greatest in the left lung base greater than right but also other areas as well. Please correlate for multifocal infiltrate or  signs of aspiration in the setting of trauma. No pleural effusion or pneumothorax. Persistent asymmetric enlargement of the left pulmonary artery. Please correlate for any known history including along pulmonic valve abnormality. Evidence of chronic liver disease with a nodular fatty liver. Splenomegaly. No significant ascites. Patent portal vein. No bowel obstruction, free air or free fluid. No evidence of solid organ injury. Bilateral renal atrophy with nonobstructing stones. Multiple cystic foci identified including 1 lesion along the left kidney posteriorly which is slightly larger and more ill-defined from previous. Recommend a dedicated MRI workup when clinically appropriate with and without contrast. Skin thickening along the anterior pelvic wall with underlying fat stranding. Please correlate for 8 size of infection. No soft tissue gas. Electronically Signed   By: Ranell Bring M.D.   On: 07/10/2023 11:08   CT Maxillofacial Wo Contrast Result Date: 07/10/2023 CLINICAL DATA:  level 2 trauma, fall, xarelto  EXAM: CT MAXILLOFACIAL WITHOUT CONTRAST TECHNIQUE: Multidetector CT imaging of the maxillofacial structures was performed. Multiplanar CT image reconstructions were also generated. RADIATION DOSE REDUCTION: This exam was performed according to the departmental dose-optimization program which includes automated exposure control, adjustment of the mA and/or kV according to patient size and/or use of iterative reconstruction technique. COMPARISON:  CT of the head dated January 13, 2023. FINDINGS: Osseous: No fracture or mandibular dislocation. No destructive process. There is moderate temporomandibular arthrosis present bilaterally. Orbits: Negative. No traumatic or inflammatory finding. Sinuses: Clear. Soft tissues: No hematoma or significant soft tissue swelling. Limited intracranial: Unremarkable. IMPRESSION: Moderate bilateral temporomandibular arthrosis. No evidence of acute traumatic injury. This These  results were called by telephone at the time of interpretation on 07/10/2023 at 10:45 am to provider [facility] , who verbally acknowledged these results. Electronically Signed   By: Evalene Coho M.D.   On: 07/10/2023 10:47   CT Cervical Spine Wo Contrast Result Date: 07/10/2023 CLINICAL DATA:  level 2 trauma, fall, xarelto  EXAM: CT CERVICAL SPINE WITHOUT CONTRAST TECHNIQUE: Multidetector CT imaging of the cervical spine was performed without intravenous contrast. Multiplanar CT image reconstructions were also generated. RADIATION DOSE REDUCTION: This exam was performed according to the departmental dose-optimization program which includes automated exposure control, adjustment of the mA and/or kV according to patient size and/or use of iterative reconstruction technique. COMPARISON:  None Available. FINDINGS: Alignment: Anatomic. Skull  base and vertebrae: Patient is status post left laminectomies at C4, C5, C6 and C7. There is no evidence of acute traumatic injury. Soft tissues and spinal canal: No evidence of hematoma or soft tissue injury. Disc levels: Slight degenerative anterolisthesis at C3-4. Mild disc space narrowing and endplate ridging at C5-6 and C6-7. No significant central spinal canal stenosis. Upper chest: Clear. Other: None. IMPRESSION: 1. Status post left-sided decompression laminectomies. No evidence of acute traumatic injury. Electronically Signed   By: Evalene Coho M.D.   On: 07/10/2023 10:41   CT Head Wo Contrast Result Date: 07/10/2023 CLINICAL DATA:  level 2 trauma, fall, xarelto  EXAM: CT HEAD WITHOUT CONTRAST TECHNIQUE: Contiguous axial images were obtained from the base of the skull through the vertex without intravenous contrast. RADIATION DOSE REDUCTION: This exam was performed according to the departmental dose-optimization program which includes automated exposure control, adjustment of the mA and/or kV according to patient size and/or use of iterative reconstruction  technique. COMPARISON:  CT of the head dated January 13, 2023. FINDINGS: Brain: Normal. No evidence of hemorrhage, mass, cortical infarct or hydrocephalus. Vascular: Negative. Skull: Intact and unremarkable. Sinuses/Orbits: Minimal mucosal disease within the maxillary sinuses. The orbits are unremarkable. Other: None. IMPRESSION: Normal brain.  No evidence of acute traumatic injury. Electronically Signed   By: Evalene Coho M.D.   On: 07/10/2023 10:33   DG Pelvis Portable Result Date: 07/10/2023 CLINICAL DATA:  Fall. EXAM: PORTABLE PELVIS 1-2 VIEWS COMPARISON:  None Available. FINDINGS: Exam limited by positioning and osteopenia. No gross fracture of the bony pelvis evident. SI joints and symphysis pubis unremarkable. Limited assessment of the hips although there is some lucency overlying the left femoral head and greater trochanter. IMPRESSION: 1. Exam limited by positioning and osteopenia. 2. Lucency overlying the left femoral head and greater trochanter. Fracture not excluded. If there is clinical concern for left hip injury, CT imaging recommended to further evaluate. Electronically Signed   By: Camellia Candle M.D.   On: 07/10/2023 10:26   DG Chest Port 1 View Result Date: 07/10/2023 CLINICAL DATA:  Status post fall. EXAM: PORTABLE CHEST 1 VIEW COMPARISON:  05/11/2023 FINDINGS: Rightward patient rotation distorts cardiomediastinal anatomy. The cardio pericardial silhouette is enlarged. There is pulmonary vascular congestion with diffuse interstitial opacity raising concern for edema. No pneumothorax or substantial pleural effusion. No acute bony abnormality. Telemetry leads overlie the chest. IMPRESSION: Enlargement of the cardiopericardial silhouette with pulmonary vascular congestion and diffuse interstitial opacity raising concern for edema. Electronically Signed   By: Camellia Candle M.D.   On: 07/10/2023 10:24     TODAY-DAY OF DISCHARGE:  Subjective:   [name] today has no headache,no  chest abdominal pain,no new weakness tingling or numbness, feels much better wants to go home today.  Objective:   Blood pressure 134/80, pulse 79, temperature 97.8 F (36.6 C), temperature source Oral, resp. rate 20, height 5' 4 (1.626 m), weight 110 kg, SpO2 92%.  Intake/Output Summary (Last 24 hours) at 07/17/2023 0749 Last data filed at 07/17/2023 0419 Gross per 24 hour  Intake 1160 ml  Output 1200 ml  Net -40 ml   Filed Weights   07/13/23 0600 07/14/23 0500 07/15/23 0502  Weight: 111.9 kg 110.3 kg 110 kg    Exam: Awake Alert, Oriented *3, No new F.N deficits, Normal affect [place].AT,PERRAL Supple Neck,No JVD, No cervical lymphadenopathy appriciated.  Symmetrical Chest wall movement, Good air movement bilaterally, CTAB RRR,No Gallops,Rubs or new Murmurs, No Parasternal Heave +ve B.Sounds, Abd Soft, Non  tender, No organomegaly appriciated, No rebound -guarding or rigidity. No Cyanosis, Clubbing or edema, No new Rash or bruise   PERTINENT RADIOLOGIC STUDIES: No results found.   PERTINENT LAB RESULTS: CBC: Recent Labs    07/16/23 0552  WBC 4.2  HGB 9.7*  HCT 31.7*  PLT 155   CMET CMP     Component Value Date/Time   NA 136 07/16/2023 0552   NA 142 07/02/2022 1251   K 4.3 07/16/2023 0552   CL 101 07/16/2023 0552   CO2 24 07/16/2023 0552   GLUCOSE 93 07/16/2023 0552   BUN 7 (L) 07/16/2023 0552   BUN 11 07/02/2022 1251   CREATININE 1.31 (H) 07/16/2023 0552   CALCIUM 9.0 07/16/2023 0552   PROT 6.5 07/16/2023 0552   PROT 6.5 07/02/2022 1251   ALBUMIN 2.8 (L) 07/16/2023 0552   ALBUMIN 3.9 07/02/2022 1251   AST 19 07/16/2023 0552   ALT 11 07/16/2023 0552   ALKPHOS 67 07/16/2023 0552   BILITOT 0.4 07/16/2023 0552   BILITOT <0.2 07/02/2022 1251   GFR 32.70 (L) 12/30/2013 1614   EGFR 45 (L) 07/02/2022 1251   GFRNONAA 45 (L) 07/16/2023 0552    GFR Estimated Creatinine Clearance: 51.2 mL/min (A) (by C-G formula based on SCr of 1.31 mg/dL (H)). No results for  input(s): LIPASE, AMYLASE in the last 72 hours. No results for input(s): CKTOTAL, CKMB, CKMBINDEX, TROPONINI in the last 72 hours. Invalid input(s): POCBNP No results for input(s): DDIMER in the last 72 hours. No results for input(s): HGBA1C in the last 72 hours. No results for input(s): CHOL, HDL, LDLCALC, TRIG, CHOLHDL, LDLDIRECT in the last 72 hours. No results for input(s): TSH, T4TOTAL, T3FREE, THYROIDAB in the last 72 hours.  Invalid input(s): FREET3 No results for input(s): VITAMINB12, FOLATE, FERRITIN, TIBC, IRON, RETICCTPCT in the last 72 hours. Coags: No results for input(s): INR in the last 72 hours.  Invalid input(s): PT Microbiology: Recent Results (from the past 240 hours)  Blood culture (routine x 2)     Status: None   Collection Time: 07/10/23 10:40 AM   Specimen: BLOOD RIGHT WRIST  Result Value Ref Range Status   Specimen Description BLOOD RIGHT WRIST  Final   Special Requests   Final    BOTTLES DRAWN AEROBIC AND ANAEROBIC Blood Culture results may not be optimal due to an inadequate volume of blood received in culture bottles   Culture   Final    NO GROWTH 5 DAYS Performed at [facility], [address] [place], [place] [address]    Report Status 07/15/2023 FINAL  Final  Respiratory (~20 pathogens) panel by PCR     Status: None   Collection Time: 07/10/23 11:39 AM   Specimen: Nasopharyngeal Swab; Respiratory  Result Value Ref Range Status   Adenovirus NOT DETECTED NOT DETECTED Final   Coronavirus 229E NOT DETECTED NOT DETECTED Final    Comment: (NOTE) The Coronavirus on the Respiratory Panel, DOES NOT test for the novel  Coronavirus (2019 nCoV)    Coronavirus HKU1 NOT DETECTED NOT DETECTED Final   Coronavirus NL63 NOT DETECTED NOT DETECTED Final   Coronavirus OC43 NOT DETECTED NOT DETECTED Final   Metapneumovirus NOT DETECTED NOT DETECTED Final   Rhinovirus / Enterovirus NOT DETECTED NOT  DETECTED Final   Influenza A NOT DETECTED NOT DETECTED Final   Influenza B NOT DETECTED NOT DETECTED Final   Parainfluenza Virus 1 NOT DETECTED NOT DETECTED Final   Parainfluenza Virus 2 NOT DETECTED NOT  DETECTED Final   Parainfluenza Virus 3 NOT DETECTED NOT DETECTED Final   Parainfluenza Virus 4 NOT DETECTED NOT DETECTED Final   Respiratory Syncytial Virus NOT DETECTED NOT DETECTED Final   Bordetella pertussis NOT DETECTED NOT DETECTED Final   Bordetella Parapertussis NOT DETECTED NOT DETECTED Final   Chlamydophila pneumoniae NOT DETECTED NOT DETECTED Final   Mycoplasma pneumoniae NOT DETECTED NOT DETECTED Final    Comment: Performed at [facility], [address], [place], [place] [address]  SARS Coronavirus 2 by RT PCR (hospital order, performed in [facility] hospital lab) *cepheid single result test*     Status: None   Collection Time: 07/10/23 11:39 AM  Result Value Ref Range Status   SARS Coronavirus 2 by RT PCR NEGATIVE NEGATIVE Final    Comment: Performed at [facility], [address], [place], [place] [address]  MRSA Next Gen by PCR, Nasal     Status: None   Collection Time: 07/10/23 12:05 PM  Result Value Ref Range Status   MRSA by PCR Next Gen NOT DETECTED NOT DETECTED Final    Comment: (NOTE) The GeneXpert MRSA Assay (FDA approved for NASAL specimens only), is one component of a comprehensive MRSA colonization surveillance program. It is not intended to diagnose MRSA infection nor to guide or monitor treatment for MRSA infections. Test performance is not FDA approved in patients less than 66 years old. Performed at [facility], [address] [place], [place] [address]   Culture, blood (single)     Status: None   Collection Time: 07/10/23  2:52 PM   Specimen: BLOOD  Result Value Ref Range Status   Specimen Description BLOOD SITE NOT SPECIFIED  Final   Special Requests   Final    BOTTLES DRAWN AEROBIC AND ANAEROBIC Blood Culture adequate volume    Culture   Final    NO GROWTH 5 DAYS Performed at [facility], [address], [place], [place] [address]    Report Status 07/15/2023 FINAL  Final  Blood culture (routine x 2)     Status: None   Collection Time: 07/10/23  2:54 PM   Specimen: BLOOD  Result Value Ref Range Status   Specimen Description BLOOD SITE NOT SPECIFIED  Final   Special Requests   Final    BOTTLES DRAWN AEROBIC AND ANAEROBIC Blood Culture adequate volume   Culture   Final    NO GROWTH 5 DAYS Performed at [facility], [address], [place], [place] [address]    Report Status 07/15/2023 FINAL  Final    FURTHER DISCHARGE INSTRUCTIONS:  Get Medicines reviewed and adjusted: Please take all your medications with you for your next visit with your Primary MD  Laboratory/radiological data: Please request your Primary MD to go over all hospital tests and procedure/radiological results at the follow up, please ask your Primary MD to get all Hospital records sent to his/her office.  In some cases, they will be blood work, cultures and biopsy results pending at the time of your discharge. Please request that your primary care M.D. goes through all the records of your hospital data and follows up on these results.  Also Note the following: If you experience worsening of your admission symptoms, develop shortness of breath, life threatening emergency, suicidal or homicidal thoughts you must seek medical attention immediately by calling 911 or calling your MD immediately  if symptoms less severe.  You must read complete instructions/literature along with  all the possible adverse reactions/side effects for all the Medicines you take and that have been prescribed to you. Take any new Medicines after you have completely understood and accpet all the possible adverse reactions/side effects.   Do not drive when taking Pain medications or sleeping medications (Benzodaizepines)  Do not take more than prescribed  Pain, Sleep and Anxiety Medications. It is not advisable to combine anxiety,sleep and pain medications without talking with your primary care practitioner  Special Instructions: If you have smoked or chewed Tobacco  in the last 2 yrs please stop smoking, stop any regular Alcohol  and or any Recreational drug use.  Wear Seat belts while driving.  Please note: You were cared for by a hospitalist during your hospital stay. Once you are discharged, your primary care physician will handle any further medical issues. Please note that NO REFILLS for any discharge medications will be authorized once you are discharged, as it is imperative that you return to your primary care physician (or establish a relationship with a primary care physician if you do not have one) for your post hospital discharge needs so that they can reassess your need for medications and monitor your lab values.  Total Time spent coordinating discharge including counseling, education and face to face time equals greater than 30 minutes.  SignedBETHA Donalda Applebaum 07/17/2023 7:49 AM

## 2023-07-17 NOTE — Progress Notes (Signed)
 Occupational Therapy Treatment Patient Details Name: [name] MRN: [id number] DOB: 1955-09-07 Today's Date: 07/17/2023   History of present illness 68 y/o woman with a history of PE on Xarelto  who presented with fall at home hitting her head, on xarelto . She had taken 2-3 Xanax  this morning. With EMS she had concern for aspiration and dehydration. In the ED she was hypotensive, given IVF with ongoing MAPs in the 40s requiring vasopressors to be started peripherally, transferred to ICU for treatment of septic shock, multilobar PNA, acute respiratory failure, acute encephalopathy, AKI on CKD 2PMH: PE, DVT on Xarelto , cirrhosis, chronic benzo use, HTN, HBV, GERD, Bipolar disorder, peripheral neuropathy   OT comments  Pt progressing towards goals, still limited by R knee pain. Pt needing CGA- min A for ADLs, and CGA for transfers with RW.pt tripoding on counter at sink for grooming tasks, fatigued after standing x10 min for grooming tasks. Pt presenting with impairments listed below, will follow acutely. Recommend HHOT at d/c.       If plan is discharge home, recommend the following:  A lot of help with bathing/dressing/bathroom;Assist for transportation;Help with stairs or ramp for entrance;Assistance with cooking/housework;A little help with walking and/or transfers   Equipment Recommendations  BSC/3in1    Recommendations for Other Services PT consult    Precautions / Restrictions Precautions Precautions: Fall Recall of Precautions/Restrictions: Impaired Restrictions Weight Bearing Restrictions Per Provider Order: No       Mobility Bed Mobility               General bed mobility comments: in recliner    Transfers Overall transfer level: Needs assistance Equipment used: Rolling walker (2 wheels) Transfers: Sit to/from Stand Sit to Stand: Contact guard assist                 Balance Overall balance assessment: Needs assistance Sitting-balance support: No upper  extremity supported, Feet supported Sitting balance-Leahy Scale: Good     Standing balance support: No upper extremity supported, During functional activity Standing balance-Leahy Scale: Fair Standing balance comment: requires RW for support                           ADL either performed or assessed with clinical judgement   ADL Overall ADL's : Needs assistance/impaired     Grooming: Set up;Standing;Oral care;Wash/dry face           Upper Body Dressing : Minimal assistance;Standing       Toilet Transfer: Contact guard assist;Ambulation;Rolling walker (2 wheels)           Functional mobility during ADLs: Contact guard assist;Rolling walker (2 wheels)      Extremity/Trunk Assessment Upper Extremity Assessment Upper Extremity Assessment: Generalized weakness   Lower Extremity Assessment Lower Extremity Assessment: Defer to PT evaluation        Vision   Vision Assessment?: No apparent visual deficits   Perception Perception Perception: Not tested   Praxis Praxis Praxis: Not tested   Communication Communication Communication: No apparent difficulties   Cognition Arousal: Alert Behavior During Therapy: WFL for tasks assessed/performed Cognition: Cognition impaired         Attention impairment (select first level of impairment): Focused attention   OT - Cognition Comments: tangential, requires min cues for redirection                 Following commands: Impaired Following commands impaired: Follows multi-step commands inconsistently      Cueing  Cueing Techniques: Verbal cues, Tactile cues  Exercises      Shoulder Instructions       General Comments VSS on RA    Pertinent Vitals/ Pain       Pain Assessment Pain Assessment: Faces Pain Score: 3  Faces Pain Scale: Hurts a little bit Pain Location: Rt knee Pain Descriptors / Indicators: Sore, Grimacing Pain Intervention(s): Limited activity within patient's tolerance,  Monitored during session, Repositioned  Home Living                                          Prior Functioning/Environment              Frequency  Min 2X/week        Progress Toward Goals  OT Goals(current goals can now be found in the care plan section)  Progress towards OT goals: Progressing toward goals  Acute Rehab OT Goals Patient Stated Goal: to get better OT Goal Formulation: With patient Time For Goal Achievement: 07/25/23 Potential to Achieve Goals: Good ADL Goals Pt Will Perform Upper Body Dressing: with contact guard assist;sitting Pt Will Perform Lower Body Dressing: with contact guard assist;sit to/from stand;sitting/lateral leans Pt Will Transfer to Toilet: with contact guard assist;ambulating;regular height toilet Pt Will Perform Tub/Shower Transfer: with contact guard assist;ambulating  Plan      Co-evaluation                 AM-PAC OT 6 Clicks Daily Activity     Outcome Measure   Help from another person eating meals?: A Little Help from another person taking care of personal grooming?: A Little Help from another person toileting, which includes using toliet, bedpan, or urinal?: A Little Help from another person bathing (including washing, rinsing, drying)?: A Lot Help from another person to put on and taking off regular upper body clothing?: A Little Help from another person to put on and taking off regular lower body clothing?: A Lot 6 Click Score: 16    End of Session Equipment Utilized During Treatment: Rolling walker (2 wheels)  OT Visit Diagnosis: Unsteadiness on feet (R26.81);Other abnormalities of gait and mobility (R26.89);Muscle weakness (generalized) (M62.81)   Activity Tolerance Patient tolerated treatment well   Patient Left in chair;with call bell/phone within reach;with chair alarm set   Nurse Communication Mobility status        Time: 9176-9148 OT Time Calculation (min): 28 min  Charges: OT  General Charges $OT Visit: 1 Visit OT Treatments $Self Care/Home Management : 23-37 mins  [name], OTD, OTR/L [employer] Preferred Acute Rehab [phone]   [name] 07/17/2023, 9:15 AM

## 2023-07-25 ENCOUNTER — Encounter (HOSPITAL_COMMUNITY): Payer: Self-pay

## 2023-07-25 ENCOUNTER — Emergency Department (HOSPITAL_COMMUNITY)

## 2023-07-25 ENCOUNTER — Emergency Department (HOSPITAL_COMMUNITY)
Admission: EM | Admit: 2023-07-25 | Discharge: 2023-07-25 | Disposition: A | Discharge status: Home / Self Care | Attending: Emergency Medicine | Admitting: Emergency Medicine

## 2023-07-25 ENCOUNTER — Other Ambulatory Visit: Payer: Self-pay

## 2023-07-25 DIAGNOSIS — R112 Nausea with vomiting, unspecified: Secondary | ICD-10-CM | POA: Diagnosis not present

## 2023-07-25 DIAGNOSIS — J449 Chronic obstructive pulmonary disease, unspecified: Secondary | ICD-10-CM | POA: Diagnosis not present

## 2023-07-25 DIAGNOSIS — R1084 Generalized abdominal pain: Secondary | ICD-10-CM | POA: Diagnosis present

## 2023-07-25 DIAGNOSIS — Z7901 Long term (current) use of anticoagulants: Secondary | ICD-10-CM | POA: Diagnosis not present

## 2023-07-25 LAB — URINALYSIS, ROUTINE W REFLEX MICROSCOPIC
Bilirubin Urine: NEGATIVE
Glucose, UA: NEGATIVE mg/dL
Hgb urine dipstick: NEGATIVE
Ketones, ur: NEGATIVE mg/dL
Leukocytes,Ua: NEGATIVE
Nitrite: NEGATIVE
Protein, ur: NEGATIVE mg/dL
Specific Gravity, Urine: 1.012 (ref 1.005–1.030)
pH: 7 (ref 5.0–8.0)

## 2023-07-25 LAB — CBC WITH DIFFERENTIAL/PLATELET
Abs Immature Granulocytes: 0.03 10*3/uL (ref 0.00–0.07)
Basophils Absolute: 0 10*3/uL (ref 0.0–0.1)
Basophils Relative: 1 %
Eosinophils Absolute: 0 10*3/uL (ref 0.0–0.5)
Eosinophils Relative: 1 %
HCT: 35.1 % — ABNORMAL LOW (ref 36.0–46.0)
Hemoglobin: 10.8 g/dL — ABNORMAL LOW (ref 12.0–15.0)
Immature Granulocytes: 1 %
Lymphocytes Relative: 30 %
Lymphs Abs: 1.3 10*3/uL (ref 0.7–4.0)
MCH: 26.3 pg (ref 26.0–34.0)
MCHC: 30.8 g/dL (ref 30.0–36.0)
MCV: 85.6 fL (ref 80.0–100.0)
Monocytes Absolute: 0.3 10*3/uL (ref 0.1–1.0)
Monocytes Relative: 8 %
Neutro Abs: 2.7 10*3/uL (ref 1.7–7.7)
Neutrophils Relative %: 59 %
Platelets: 192 10*3/uL (ref 150–400)
RBC: 4.1 MIL/uL (ref 3.87–5.11)
RDW: 16.9 % — ABNORMAL HIGH (ref 11.5–15.5)
WBC: 4.4 10*3/uL (ref 4.0–10.5)
nRBC: 0 % (ref 0.0–0.2)

## 2023-07-25 LAB — PROTIME-INR
INR: 1.5 — ABNORMAL HIGH (ref 0.8–1.2)
Prothrombin Time: 18.7 s — ABNORMAL HIGH (ref 11.4–15.2)

## 2023-07-25 LAB — COMPREHENSIVE METABOLIC PANEL WITH GFR
ALT: 11 U/L (ref 0–44)
AST: 19 U/L (ref 15–41)
Albumin: 3.5 g/dL (ref 3.5–5.0)
Alkaline Phosphatase: 104 U/L (ref 38–126)
Anion gap: 7 (ref 5–15)
BUN: 11 mg/dL (ref 8–23)
CO2: 26 mmol/L (ref 22–32)
Calcium: 9.4 mg/dL (ref 8.9–10.3)
Chloride: 101 mmol/L (ref 98–111)
Creatinine, Ser: 1.3 mg/dL — ABNORMAL HIGH (ref 0.44–1.00)
GFR, Estimated: 45 mL/min — ABNORMAL LOW (ref >60)
Glucose, Bld: 94 mg/dL (ref 70–99)
Potassium: 4.7 mmol/L (ref 3.5–5.1)
Sodium: 134 mmol/L — ABNORMAL LOW (ref 135–145)
Total Bilirubin: 0.6 mg/dL (ref 0.0–1.2)
Total Protein: 7.6 g/dL (ref 6.5–8.1)

## 2023-07-25 LAB — TROPONIN I (HIGH SENSITIVITY)
Troponin I (High Sensitivity): 6 ng/L (ref <18)
Troponin I (High Sensitivity): 6 ng/L (ref <18)

## 2023-07-25 LAB — LIPASE, BLOOD: Lipase: 88 U/L — ABNORMAL HIGH (ref 11–51)

## 2023-07-25 MED ORDER — HYDROMORPHONE HCL 1 MG/ML IJ SOLN
0.5000 mg | Freq: Once | INTRAMUSCULAR | Status: AC
  Administered 2023-07-25: 0.5 mg via INTRAVENOUS
  Filled 2023-07-25: qty 1

## 2023-07-25 MED ORDER — SODIUM CHLORIDE (PF) 0.9 % IJ SOLN
INTRAMUSCULAR | Status: AC
  Filled 2023-07-25: qty 50

## 2023-07-25 MED ORDER — IOHEXOL 300 MG/ML  SOLN
100.0000 mL | Freq: Once | INTRAMUSCULAR | Status: AC | PRN
  Administered 2023-07-25: 75 mL via INTRAVENOUS

## 2023-07-25 MED ORDER — MORPHINE SULFATE (PF) 4 MG/ML IV SOLN
4.0000 mg | Freq: Once | INTRAVENOUS | Status: AC
  Administered 2023-07-25: 4 mg via INTRAVENOUS
  Filled 2023-07-25: qty 1

## 2023-07-25 MED ORDER — IPRATROPIUM-ALBUTEROL 0.5-2.5 (3) MG/3ML IN SOLN
3.0000 mL | Freq: Once | RESPIRATORY_TRACT | Status: AC
  Administered 2023-07-25: 3 mL via RESPIRATORY_TRACT
  Filled 2023-07-25: qty 3

## 2023-07-25 MED ORDER — ONDANSETRON HCL 4 MG/2ML IJ SOLN
4.0000 mg | Freq: Once | INTRAMUSCULAR | Status: AC | PRN
  Administered 2023-07-25: 4 mg via INTRAVENOUS
  Filled 2023-07-25: qty 2

## 2023-07-25 NOTE — Discharge Instructions (Signed)
 If you develop worsening, continued, or recurrent abdominal pain, uncontrolled vomiting, fever, chest or back pain, or any other new/concerning symptoms then return to the ER for evaluation.

## 2023-07-25 NOTE — ED Notes (Signed)
 Pt unable to provide urine at this time

## 2023-07-25 NOTE — ED Triage Notes (Signed)
 Pt BIBA from home for abdominal pain that awoke pt from sleep 2 hours ago.  Pt states pain is radiating to back and feels like a cirrhosis flare up.   Pt endorses not feeling well 2 days.    144/90 65 16 RR 97.9 temps 96% 102CBG

## 2023-07-25 NOTE — ED Provider Notes (Signed)
 [place] EMERGENCY DEPARTMENT AT [facility] Provider Note   CSN: [id number] Arrival date & time: 07/25/23  9194     Patient presents with: Abdominal Pain   [name] is a 68 y.o. female.   HPI 32 female with history of cirrhosis and COPD who presents with abdominal pain.  She states for the last couple days she has been having progressively worsening generalized abdominal pain that wraps around to her back.  She gets this type of pain every 3 to 4 months but states it is never this severe.  She has had nausea without vomiting.  No diarrhea, constipation, fever or urinary symptoms.  She endorses continued chest pain, cough, and shortness of breath since her discharge.  She has had some wheezing.  Prior to Admission medications   Medication Sig Start Date End Date Taking? Authorizing Provider  albuterol  (PROAIR  HFA) 108 (90 Base) MCG/ACT inhaler INHALE 2 PUFFS INTO THE LUNGS EVERY 4 HOURS AS NEEDED FOR WHEEZING Patient taking differently: Inhale 2 puffs into the lungs every 4 (four) hours as needed for shortness of breath or wheezing. 12/04/18   Jude Harden GAILS, MD  albuterol  (PROVENTIL ) (2.5 MG/3ML) 0.083% nebulizer solution Take 3 mLs (2.5 mg total) by nebulization every 4 (four) hours as needed for wheezing or shortness of breath. Patient taking differently: Take 2.5 mg by nebulization as needed for wheezing or shortness of breath. 12/04/18   Jude Harden GAILS, MD  ALPRAZolam  (XANAX ) 1 MG tablet Take 1 tablet (1 mg total) by mouth 3 (three) times daily. Patient taking differently: Take 1 mg by mouth 3 (three) times daily as needed for anxiety. 06/27/22   Shalhoub, Zachary PARAS, MD  apixaban  (ELIQUIS ) 5 MG TABS tablet Take 1 tablet (5 mg total) by mouth 2 (two) times daily. 07/17/23   Ghimire, Donalda CHRISTELLA, MD  budesonide -formoterol  (SYMBICORT ) 80-4.5 MCG/ACT inhaler Inhale 2 puffs into the lungs in the morning and at bedtime. 05/05/23   Elnor Jayson LABOR, DO  carvedilol  (COREG ) 12.5 MG  tablet Take 1 tablet (12.5 mg total) by mouth 2 (two) times daily with a meal. 06/27/22   Shalhoub, Zachary PARAS, MD  Cholecalciferol  (VITAMIN D3) 50 MCG (2000 UT) TABS Take 2,000 Units by mouth daily.    [provider]  clotrimazole -betamethasone  (LOTRISONE ) cream Apply to affected area 2 times daily prn Patient taking differently: Apply 1 application  topically as needed (rash). 04/29/20   Steinl, Kevin, MD  divalproex  (DEPAKOTE  ER) 500 MG 24 hr tablet Take 500 mg by mouth every evening.    [provider]  docusate sodium  (COLACE) 100 MG capsule Take 100 mg by mouth 2 (two) times daily.    [provider]  doxepin  (SINEQUAN ) 100 MG capsule Take 100 mg by mouth at bedtime. 06/01/22   [provider]  entecavir  (BARACLUDE ) 0.5 MG tablet Take 0.5 mg by mouth every other day. Patient not taking: Reported on 07/11/2023 11/25/19   [provider]  famotidine  (PEPCID ) 20 MG tablet Take 20 mg by mouth 2 (two) times daily. 10/28/19   [provider]  ferrous sulfate  (FEROSUL) 325 (65 FE) MG tablet Take 325 mg by mouth daily.    [provider]  gabapentin  (NEURONTIN ) 300 MG capsule Take 300-600 mg by mouth 3 (three) times daily. 07/13/20   [provider]  levocetirizine (XYZAL) 5 MG tablet Take 5 mg by mouth every evening. 12/02/19   [provider]  montelukast  (SINGULAIR ) 10 MG tablet Take 10  mg by mouth daily.    [provider]  NYSTATIN powder Apply 1 Application topically as needed (yeast). 07/05/21   [provider]  ondansetron  (ZOFRAN -ODT) 8 MG disintegrating tablet Take 8 mg by mouth every 8 (eight) hours as needed for nausea or vomiting.    [provider]  pantoprazole  (PROTONIX ) 40 MG tablet Take 40 mg by mouth 2 (two) times daily before a meal. 12/23/13   [provider]  spironolactone  (ALDACTONE ) 50 MG tablet Take 50 mg by mouth daily. 06/27/23 12/24/23  [provider]   valACYclovir  (VALTREX ) 500 MG tablet Take 500 mg by mouth daily as needed (breakout). 08/21/17   [provider]  VEMLIDY  25 MG tablet Take 25 mg by mouth daily. 06/14/23 12/24/23  [provider]  enoxaparin  (LOVENOX ) 120 MG/0.8ML SOLN Inject 0.8 mLs (120 mg total) into the skin every 12 (twelve) hours. 11/30/10 04/16/11  Pleas Morene HERO, MD  loratadine  (CLARITIN ) 10 MG tablet Take 10 mg by mouth daily.    04/16/11  [provider]    Allergies: Heparin , Amitriptyline  hcl, Azithromycin , Fentanyl , Cymbalta [duloxetine hcl], and Neomycin-bacitracin zn-polymyx    Review of Systems  Constitutional:  Negative for fever.  Respiratory:  Positive for cough, shortness of breath and wheezing.   Cardiovascular:  Positive for chest pain. Negative for leg swelling.  Gastrointestinal:  Positive for abdominal pain and nausea. Negative for constipation, diarrhea and vomiting.  Genitourinary:  Negative for dysuria.  Musculoskeletal:  Positive for back pain.    Updated Vital Signs BP (!) 147/82 (BP Location: Left Arm)   Pulse (!) 55   Temp 98.1 F (36.7 C) (Oral)   Resp 18   SpO2 99%   Physical Exam Vitals and nursing note reviewed.  Constitutional:      General: She is not in acute distress.    Appearance: She is well-developed. She is not ill-appearing or diaphoretic.  HENT:     Head: Normocephalic and atraumatic.  Cardiovascular:     Rate and Rhythm: Normal rate and regular rhythm.     Heart sounds: Normal heart sounds.  Pulmonary:     Effort: Pulmonary effort is normal. No tachypnea or accessory muscle usage.     Breath sounds: Wheezing (worst in the lower lobes) present.  Abdominal:     Palpations: Abdomen is soft.     Tenderness: There is generalized abdominal tenderness. There is no guarding or rebound.  Skin:    General: Skin is warm and dry.  Neurological:     Mental Status: She is alert.     (all labs ordered are listed, but only abnormal  results are displayed) Labs Reviewed  URINALYSIS, ROUTINE W REFLEX MICROSCOPIC - Abnormal; Notable for the following components:      Result Value   Color, Urine STRAW (*)    All other components within normal limits  COMPREHENSIVE METABOLIC PANEL WITH GFR - Abnormal; Notable for the following components:   Sodium 134 (*)    Creatinine, Ser 1.30 (*)    GFR, Estimated 45 (*)    All other components within normal limits  LIPASE, BLOOD - Abnormal; Notable for the following components:   Lipase 88 (*)    All other components within normal limits  CBC WITH DIFFERENTIAL/PLATELET - Abnormal; Notable for the following components:   Hemoglobin 10.8 (*)    HCT 35.1 (*)    RDW 16.9 (*)    All other components within normal limits  PROTIME-INR - Abnormal; Notable  for the following components:   Prothrombin Time 18.7 (*)    INR 1.5 (*)    All other components within normal limits  TROPONIN I (HIGH SENSITIVITY)  TROPONIN I (HIGH SENSITIVITY)    EKG: EKG Interpretation Date/Time:  Wednesday July 25 2023 09:02:06 EDT Ventricular Rate:  63 PR Interval:  148 QRS Duration:  98 QT Interval:  405 QTC Calculation: 415 R Axis:   -31  Text Interpretation: Sinus rhythm Left axis deviation Abnormal R-wave progression, early transition Nonspecific T abnormalities, lateral leads no significant change since June 2025 Confirmed by Freddi Hamilton [phone]) on 07/25/2023 9:04:58 AM  Radiology: CT ABDOMEN PELVIS W CONTRAST Result Date: 07/25/2023 CLINICAL DATA:  Abdominal pain, acute, nonlocalized. History of cirrhosis. EXAM: CT ABDOMEN AND PELVIS WITH CONTRAST TECHNIQUE: Multidetector CT imaging of the abdomen and pelvis was performed using the standard protocol following bolus administration of intravenous contrast. RADIATION DOSE REDUCTION: This exam was performed according to the departmental dose-optimization program which includes automated exposure control, adjustment of the mA and/or kV according to  patient size and/or use of iterative reconstruction technique. CONTRAST:  75mL OMNIPAQUE  IOHEXOL  300 MG/ML  SOLN COMPARISON:  CT chest, abdomen common pelvis a 07/10/2023. FINDINGS: Lower chest: Decreased multifocal ill-defined ground-glass opacities in the bilateral lower lobes and right middle lobe. Hepatobiliary: Cirrhotic morphology diffuse contour nodularity. No suspicious focal hepatic lesion. Gallbladder is unremarkable. No biliary dilatation. Pancreas: Unremarkable. No pancreatic ductal dilatation or surrounding inflammatory changes. Spleen: Spleen is enlarged measuring up to 15.1 cm in craniocaudal dimension, similar to the prior exam. Adrenals/Urinary Tract: Adrenal glands are unremarkable. Moderate bilateral renal atrophy with bilateral nonobstructing calculi with the largest measuring up to 5 mm on the left. No hydronephrosis. Redemonstrated indeterminate 1.7 cm lesion at the posterior left kidney measures 42 Hounsfield units (2:37). Unchanged bilateral cysts. Bladder is unremarkable. Stomach/Bowel: Stomach is within normal limits. 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 Small bowel and colon are grossly unremarkable. No evidence of obstruction or focal inflammatory changes. Vascular/Lymphatic: Abdominal aorta is normal in caliber with mild atherosclerotic calcification. No enlarged abdominal or pelvic lymph nodes. Reproductive: Status post hysterectomy. No adnexal masses. Other: No abdominal wall hernia or abnormality. No abdominopelvic ascites. No intraperitoneal free air. Musculoskeletal: No acute osseous abnormality. No suspicious osseous lesion. Dextrocurvature of the thoracolumbar spine with multilevel degenerative disc changes and similar grade 1 anterolisthesis of L4 on L5. Moderate bilateral hip osteoarthritis. IMPRESSION: 1. No acute localizing findings in the abdomen or pelvis. 2. Moderate bilateral renal atrophy with unchanged indeterminate 1.7 cm lesion at the posterior left kidney. Recommend  dedicated MRI with and without contrast when clinically appropriate. 3. Nonobstructive bilateral renal calculi.  No hydronephrosis. 4. Decreased multifocal ill-defined ground-glass opacities in the bilateral lower lobes and right middle lobe. Electronically Signed   By: Harrietta Sherry M.D.   On: 07/25/2023 10:43   DG Chest 2 View Result Date: 07/25/2023 CLINICAL DATA:  continued cough, chest pain. EXAM: CHEST - 2 VIEW COMPARISON:  07/10/2023. FINDINGS: Bilateral lung fields are clear. Bilateral costophrenic angles are clear. Normal cardio-mediastinal silhouette. Note is again made of unfolding of aortic arch. No acute osseous abnormalities. The soft tissues are within normal limits. IMPRESSION: No active cardiopulmonary disease. Electronically Signed   By: Ree Molt M.D.   On: 07/25/2023 08:59     Procedures   Medications Ordered in the ED  ipratropium-albuterol  (DUONEB) 0.5-2.5 (3) MG/3ML nebulizer solution 3 mL (3 mLs Nebulization Given 07/25/23 0858)  HYDROmorphone  (DILAUDID ) injection 0.5 mg (0.5  mg Intravenous Given 07/25/23 0859)  ondansetron  (ZOFRAN ) injection 4 mg (4 mg Intravenous Given 07/25/23 0858)  iohexol  (OMNIPAQUE ) 300 MG/ML solution 100 mL (75 mLs Intravenous Contrast Given 07/25/23 1001)  morphine  (PF) 4 MG/ML injection 4 mg (4 mg Intravenous Given 07/25/23 1049)                                    Medical Decision Making Amount and/or Complexity of Data Reviewed Labs: ordered.    Details: Troponin is negative x 2.  Lipase a little elevated but not technically pancreatitis Radiology: ordered and independent interpretation performed.    Details: No bowel obstruction.  ECG/medicine tests: ordered and independent interpretation performed.    Details: No ischemia  Risk Prescription drug management.   Patient presents with abdominal pain.  Seems to be an on and off issue.  No focal tenderness.  Fairly benign abdominal exam but with her complicated medical history including  cirrhosis, COPD, recent pneumonia, kidney stones, and other comorbidities I think CT is warranted.  Fortunately does not show any significant findings.  No UTI.  Lipase is mildly elevated but not 2 times the upper limit of normal and so I think pancreatitis is less likely.  No pancreas findings on the CT. The pneumonia seems to be improving and she has not increased work of breathing or hypoxia.   Patient appears stable for discharge.  Will have her follow-up with her PCP.  After discharge, when signing this note, I am noticing that her pressure is last documented at 194/97. I was not made aware of this prior to discharge. She was previously not significantly hypertensive in the room.   After discharge I did call the patient and let her know about the left renal lesion. She was made aware of need of an outpatient MRI, and that her PCP can order this.    Final diagnoses:  Generalized abdominal pain    ED Discharge Orders     None          Freddi Hamilton, MD 07/25/23 1407

## 2023-07-26 ENCOUNTER — Ambulatory Visit: Admitting: Neurology

## 2023-08-10 ENCOUNTER — Other Ambulatory Visit: Payer: Self-pay | Admitting: Vascular Surgery

## 2023-08-10 DIAGNOSIS — M7989 Other specified soft tissue disorders: Secondary | ICD-10-CM

## 2023-08-28 ENCOUNTER — Encounter

## 2023-08-28 ENCOUNTER — Encounter (HOSPITAL_COMMUNITY)

## 2023-09-14 ENCOUNTER — Emergency Department (EMERGENCY_DEPARTMENT_HOSPITAL)

## 2023-09-14 ENCOUNTER — Emergency Department (HOSPITAL_COMMUNITY)

## 2023-09-14 ENCOUNTER — Encounter (HOSPITAL_COMMUNITY): Payer: Self-pay

## 2023-09-14 ENCOUNTER — Emergency Department (HOSPITAL_COMMUNITY)
Admission: EM | Admit: 2023-09-14 | Discharge: 2023-09-14 | Disposition: A | Discharge status: Home / Self Care | Attending: Emergency Medicine | Admitting: Emergency Medicine

## 2023-09-14 ENCOUNTER — Other Ambulatory Visit: Payer: Self-pay

## 2023-09-14 DIAGNOSIS — R609 Edema, unspecified: Secondary | ICD-10-CM | POA: Diagnosis not present

## 2023-09-14 DIAGNOSIS — J449 Chronic obstructive pulmonary disease, unspecified: Secondary | ICD-10-CM | POA: Diagnosis not present

## 2023-09-14 DIAGNOSIS — R0602 Shortness of breath: Secondary | ICD-10-CM | POA: Insufficient documentation

## 2023-09-14 DIAGNOSIS — Z7901 Long term (current) use of anticoagulants: Secondary | ICD-10-CM | POA: Insufficient documentation

## 2023-09-14 DIAGNOSIS — R600 Localized edema: Secondary | ICD-10-CM | POA: Diagnosis not present

## 2023-09-14 DIAGNOSIS — R0789 Other chest pain: Secondary | ICD-10-CM | POA: Insufficient documentation

## 2023-09-14 DIAGNOSIS — R079 Chest pain, unspecified: Secondary | ICD-10-CM

## 2023-09-14 DIAGNOSIS — M79604 Pain in right leg: Secondary | ICD-10-CM | POA: Insufficient documentation

## 2023-09-14 DIAGNOSIS — R058 Other specified cough: Secondary | ICD-10-CM | POA: Insufficient documentation

## 2023-09-14 DIAGNOSIS — Z7951 Long term (current) use of inhaled steroids: Secondary | ICD-10-CM | POA: Diagnosis not present

## 2023-09-14 LAB — TROPONIN I (HIGH SENSITIVITY)
Troponin I (High Sensitivity): 8 ng/L (ref <18)
Troponin I (High Sensitivity): 9 ng/L (ref <18)

## 2023-09-14 LAB — URINALYSIS, W/ REFLEX TO CULTURE (INFECTION SUSPECTED)
Bacteria, UA: NONE SEEN
Bilirubin Urine: NEGATIVE
Glucose, UA: NEGATIVE mg/dL
Hgb urine dipstick: NEGATIVE
Ketones, ur: NEGATIVE mg/dL
Leukocytes,Ua: NEGATIVE
Nitrite: NEGATIVE
Protein, ur: NEGATIVE mg/dL
Specific Gravity, Urine: 1.005 (ref 1.005–1.030)
pH: 7 (ref 5.0–8.0)

## 2023-09-14 LAB — CBC WITH DIFFERENTIAL/PLATELET
Abs Immature Granulocytes: 0.02 K/uL (ref 0.00–0.07)
Basophils Absolute: 0 K/uL (ref 0.0–0.1)
Basophils Relative: 1 %
Eosinophils Absolute: 0.1 K/uL (ref 0.0–0.5)
Eosinophils Relative: 1 %
HCT: 29.1 % — ABNORMAL LOW (ref 36.0–46.0)
Hemoglobin: 9 g/dL — ABNORMAL LOW (ref 12.0–15.0)
Immature Granulocytes: 1 %
Lymphocytes Relative: 31 %
Lymphs Abs: 1.3 K/uL (ref 0.7–4.0)
MCH: 25.6 pg — ABNORMAL LOW (ref 26.0–34.0)
MCHC: 30.9 g/dL (ref 30.0–36.0)
MCV: 82.9 fL (ref 80.0–100.0)
Monocytes Absolute: 0.4 K/uL (ref 0.1–1.0)
Monocytes Relative: 10 %
Neutro Abs: 2.3 K/uL (ref 1.7–7.7)
Neutrophils Relative %: 56 %
Platelets: 149 K/uL — ABNORMAL LOW (ref 150–400)
RBC: 3.51 MIL/uL — ABNORMAL LOW (ref 3.87–5.11)
RDW: 16 % — ABNORMAL HIGH (ref 11.5–15.5)
WBC: 4.1 K/uL (ref 4.0–10.5)
nRBC: 0 % (ref 0.0–0.2)

## 2023-09-14 LAB — COMPREHENSIVE METABOLIC PANEL WITH GFR
ALT: 17 U/L (ref 0–44)
AST: 21 U/L (ref 15–41)
Albumin: 3.5 g/dL (ref 3.5–5.0)
Alkaline Phosphatase: 87 U/L (ref 38–126)
Anion gap: 11 (ref 5–15)
BUN: 23 mg/dL (ref 8–23)
CO2: 25 mmol/L (ref 22–32)
Calcium: 9.2 mg/dL (ref 8.9–10.3)
Chloride: 98 mmol/L (ref 98–111)
Creatinine, Ser: 1.33 mg/dL — ABNORMAL HIGH (ref 0.44–1.00)
GFR, Estimated: 44 mL/min — ABNORMAL LOW (ref >60)
Glucose, Bld: 85 mg/dL (ref 70–99)
Potassium: 4.2 mmol/L (ref 3.5–5.1)
Sodium: 134 mmol/L — ABNORMAL LOW (ref 135–145)
Total Bilirubin: 0.5 mg/dL (ref 0.0–1.2)
Total Protein: 7.3 g/dL (ref 6.5–8.1)

## 2023-09-14 LAB — RESP PANEL BY RT-PCR (RSV, FLU A&B, COVID)  RVPGX2
Influenza A by PCR: NEGATIVE
Influenza B by PCR: NEGATIVE
Resp Syncytial Virus by PCR: NEGATIVE
SARS Coronavirus 2 by RT PCR: NEGATIVE

## 2023-09-14 MED ORDER — IPRATROPIUM-ALBUTEROL 0.5-2.5 (3) MG/3ML IN SOLN
3.0000 mL | Freq: Once | RESPIRATORY_TRACT | Status: AC
  Administered 2023-09-14: 3 mL via RESPIRATORY_TRACT
  Filled 2023-09-14: qty 3

## 2023-09-14 MED ORDER — ALBUTEROL SULFATE (2.5 MG/3ML) 0.083% IN NEBU
5.0000 mg | INHALATION_SOLUTION | Freq: Once | RESPIRATORY_TRACT | Status: AC
  Administered 2023-09-14: 5 mg via RESPIRATORY_TRACT
  Filled 2023-09-14: qty 6

## 2023-09-14 MED ORDER — OXYCODONE HCL 5 MG PO TABS
5.0000 mg | ORAL_TABLET | Freq: Once | ORAL | Status: AC
  Administered 2023-09-14: 5 mg via ORAL
  Filled 2023-09-14: qty 1

## 2023-09-14 MED ORDER — NYSTATIN 100000 UNIT/GM EX POWD
Freq: Once | CUTANEOUS | Status: AC
  Filled 2023-09-14: qty 15

## 2023-09-14 MED ORDER — IOHEXOL 350 MG/ML SOLN
75.0000 mL | Freq: Once | INTRAVENOUS | Status: AC | PRN
  Administered 2023-09-14: 75 mL via INTRAVENOUS

## 2023-09-14 MED ORDER — HYDROCODONE-ACETAMINOPHEN 5-325 MG PO TABS
1.0000 | ORAL_TABLET | Freq: Once | ORAL | Status: DC
  Filled 2023-09-14: qty 1

## 2023-09-14 NOTE — Progress Notes (Signed)
 VASCULAR LAB    Right lower extremity venous duplex has been performed.  See CV proc for preliminary results.  Gave verbal report to Dr. Dasie LIS, [facility], RVT 09/14/2023, 9:45 AM

## 2023-09-14 NOTE — ED Notes (Signed)
Pt given a cup of ice

## 2023-09-14 NOTE — ED Provider Notes (Signed)
 [place] EMERGENCY DEPARTMENT AT [facility] Provider Note   CSN: [id number] Arrival date & time: 09/14/23  9642     Patient presents with: Multiple Complaints   [name] is a 68 y.o. female.   The history is provided by the patient, the EMS personnel and medical records.  [name] is a 68 y.o. female who presents to the Emergency Department complaining of chest pain. She presents the emergency department for evaluation of three days of central chest pain described as a constant pressure sensation. She has occasional intermittent shortness of breath with this. She also reports two weeks of pain and swelling to her right leg. She did have a fever earlier in the week 2101 on Monday, Tuesday and Wednesday. She also reports cough productive of yellow sputum. No nausea, vomiting, diarrhea. She does have a history of DVT and is compliant with her home eloquence. Also has a history of COPD. She was admitted in June for septic shock.  Prior to Admission medications   Medication Sig Start Date End Date Taking? Authorizing Provider  albuterol  (PROAIR  HFA) 108 (90 Base) MCG/ACT inhaler INHALE 2 PUFFS INTO THE LUNGS EVERY 4 HOURS AS NEEDED FOR WHEEZING Patient taking differently: Inhale 2 puffs into the lungs every 4 (four) hours as needed for shortness of breath or wheezing. 12/04/18   Jude Harden GAILS, MD  albuterol  (PROVENTIL ) (2.5 MG/3ML) 0.083% nebulizer solution Take 3 mLs (2.5 mg total) by nebulization every 4 (four) hours as needed for wheezing or shortness of breath. Patient taking differently: Take 2.5 mg by nebulization as needed for wheezing or shortness of breath. 12/04/18   Jude Harden GAILS, MD  ALPRAZolam  (XANAX ) 1 MG tablet Take 1 tablet (1 mg total) by mouth 3 (three) times daily. Patient taking differently: Take 1 mg by mouth 3 (three) times daily as needed for anxiety. 06/27/22   Shalhoub, Zachary PARAS, MD  apixaban  (ELIQUIS ) 5 MG TABS tablet Take 1 tablet (5 mg total) by  mouth 2 (two) times daily. 07/17/23   Ghimire, Donalda CHRISTELLA, MD  budesonide -formoterol  (SYMBICORT ) 80-4.5 MCG/ACT inhaler Inhale 2 puffs into the lungs in the morning and at bedtime. 05/05/23   Elnor Jayson LABOR, DO  carvedilol  (COREG ) 12.5 MG tablet Take 1 tablet (12.5 mg total) by mouth 2 (two) times daily with a meal. 06/27/22   Shalhoub, Zachary PARAS, MD  Cholecalciferol  (VITAMIN D3) 50 MCG (2000 UT) TABS Take 2,000 Units by mouth daily.    [provider]  clotrimazole -betamethasone  (LOTRISONE ) cream Apply to affected area 2 times daily prn Patient taking differently: Apply 1 application  topically as needed (rash). 04/29/20   Steinl, Kevin, MD  divalproex  (DEPAKOTE  ER) 500 MG 24 hr tablet Take 500 mg by mouth every evening.    [provider]  docusate sodium  (COLACE) 100 MG capsule Take 100 mg by mouth 2 (two) times daily.    [provider]  doxepin  (SINEQUAN ) 100 MG capsule Take 100 mg by mouth at bedtime. 06/01/22   [provider]  entecavir  (BARACLUDE ) 0.5 MG tablet Take 0.5 mg by mouth every other day. Patient not taking: Reported on 07/11/2023 11/25/19   [provider]  famotidine  (PEPCID ) 20 MG tablet Take 20 mg by mouth 2 (two) times daily. 10/28/19   [provider]  ferrous sulfate  (FEROSUL) 325 (65 FE) MG tablet Take 325 mg by mouth daily.    [provider]  gabapentin  (NEURONTIN ) 300 MG capsule Take 300-600 mg by  mouth 3 (three) times daily. 07/13/20   [provider]  levocetirizine (XYZAL) 5 MG tablet Take 5 mg by mouth every evening. 12/02/19   [provider]  montelukast  (SINGULAIR ) 10 MG tablet Take 10 mg by mouth daily.    [provider]  NYSTATIN  powder Apply 1 Application topically as needed (yeast). 07/05/21   [provider]  ondansetron  (ZOFRAN -ODT) 8 MG disintegrating tablet Take 8 mg by mouth every 8 (eight) hours as needed for nausea or vomiting.    [provider]  pantoprazole   (PROTONIX ) 40 MG tablet Take 40 mg by mouth 2 (two) times daily before a meal. 12/23/13   [provider]  spironolactone  (ALDACTONE ) 50 MG tablet Take 50 mg by mouth daily. 06/27/23 12/24/23  [provider]  valACYclovir  (VALTREX ) 500 MG tablet Take 500 mg by mouth daily as needed (breakout). 08/21/17   [provider]  VEMLIDY  25 MG tablet Take 25 mg by mouth daily. 06/14/23 12/24/23  [provider]  enoxaparin  (LOVENOX ) 120 MG/0.8ML SOLN Inject 0.8 mLs (120 mg total) into the skin every 12 (twelve) hours. 11/30/10 04/16/11  Pleas Morene HERO, MD  loratadine  (CLARITIN ) 10 MG tablet Take 10 mg by mouth daily.    04/16/11  [provider]    Allergies: Heparin , Amitriptyline  hcl, Azithromycin , Fentanyl , Prozac  [fluoxetine  hcl], Cymbalta [duloxetine hcl], and Neomycin-bacitracin zn-polymyx    Review of Systems  All other systems reviewed and are negative.   Updated Vital Signs BP (!) 144/89 (BP Location: Left Arm)   Pulse 78   Temp 97.7 F (36.5 C) (Oral)   Resp 18   Ht 5' 4 (1.626 m)   Wt 104.3 kg   SpO2 97%   BMI 39.48 kg/m   Physical Exam Vitals and nursing note reviewed.  Constitutional:      Appearance: She is well-developed.  HENT:     Head: Normocephalic and atraumatic.  Cardiovascular:     Rate and Rhythm: Normal rate and regular rhythm.     Heart sounds: No murmur heard. Pulmonary:     Effort: Pulmonary effort is normal. No respiratory distress.     Comments: Expiratory wheezes bilaterally Abdominal:     Palpations: Abdomen is soft.     Tenderness: There is no abdominal tenderness. There is no guarding or rebound.  Musculoskeletal:        General: No tenderness.     Comments: Pitting edema to BLE, right greater than left.  2+ DP pulses bilaterally.   Skin:    General: Skin is warm and dry.  Neurological:     Mental Status: She is alert and oriented to person, place, and time.  Psychiatric:        Behavior:  Behavior normal.     (all labs ordered are listed, but only abnormal results are displayed) Labs Reviewed - No data to display  EKG: None  Radiology: No results found.   Procedures   Medications Ordered in the ED - No data to display                                  Medical Decision Making Amount and/or Complexity of Data Reviewed Labs: ordered. Radiology: ordered.  Risk Prescription drug management.   Patient with history of recurrent DVT here for evaluation of right leg pain, chest pain. She does have edema on examination, no evidence of compartment syndrome. She has good perfusion  distally. She does have occasional wheezing on exam with no respiratory distress. Troponins are negative times two. Current picture is not consistent with ACS. Given her history plan to obtain CTA, vascular ultrasound to rule out DVT/PE. Patient care transferred pending additional imaging, urinalysis.     Final diagnoses:  None    ED Discharge Orders     None          Griselda Norris, MD 09/14/23 [phone]

## 2023-09-14 NOTE — ED Triage Notes (Addendum)
 Pt came in from home via EMS w/ multiple complaints. Originally called out for Chest pain, and SOB that resolved on EMS. Pt is on room air. Pt main complaints are R leg pain and kidney pain. Pt states they fell today.    50mcg fent

## 2023-09-14 NOTE — ED Notes (Signed)
 Patient transported to CT

## 2023-09-14 NOTE — ED Provider Notes (Signed)
 Patient signed out to me by Dr. Griselda pending results of workup.  Workup here shows no significant findings.  Will discharge home   Dasie Faden, MD 09/14/23 1008

## 2023-09-27 ENCOUNTER — Ambulatory Visit (HOSPITAL_COMMUNITY)
Admission: RE | Admit: 2023-09-27 | Discharge: 2023-09-27 | Disposition: A | Discharge status: Home / Self Care | Source: Ambulatory Visit | Attending: Vascular Surgery | Admitting: Vascular Surgery

## 2023-09-27 DIAGNOSIS — M7989 Other specified soft tissue disorders: Secondary | ICD-10-CM | POA: Insufficient documentation

## 2023-10-31 ENCOUNTER — Ambulatory Visit

## 2023-11-12 ENCOUNTER — Encounter: Payer: Self-pay | Admitting: Neurology

## 2023-11-12 ENCOUNTER — Ambulatory Visit: Admitting: Neurology

## 2023-11-12 VITALS — BP 86/52 | HR 66 | Resp 17 | Ht 64.0 in

## 2023-11-12 DIAGNOSIS — F419 Anxiety disorder, unspecified: Secondary | ICD-10-CM | POA: Diagnosis not present

## 2023-11-12 DIAGNOSIS — G629 Polyneuropathy, unspecified: Secondary | ICD-10-CM

## 2023-11-12 DIAGNOSIS — N1832 Chronic kidney disease, stage 3b: Secondary | ICD-10-CM

## 2023-11-12 DIAGNOSIS — I951 Orthostatic hypotension: Secondary | ICD-10-CM

## 2023-11-12 MED ORDER — LIDOCAINE-PRILOCAINE 2.5-2.5 % EX CREA: 1.0000 | TOPICAL_CREAM | CUTANEOUS | 3 refills | Status: DC | PRN

## 2023-11-12 MED ORDER — LIDOCAINE-PRILOCAINE 2.5-2.5 % EX CREA: 1.0000 | TOPICAL_CREAM | CUTANEOUS | 3 refills | Status: AC | PRN

## 2023-11-12 NOTE — Progress Notes (Signed)
 [facility]  PATIENT: [name] DOB: 07-29-1955  REQUESTING CLINICIAN: Briana Elgin LABOR, MD HISTORY FROM: Patient/Chart review  REASON FOR VISIT: Peripheral Neuropathy/Abnormal gait    HISTORICAL  CHIEF COMPLAINT:  Chief Complaint  Patient presents with   New Patient (Initial Visit)    Rm12, alone,  internal referral for Acute metabolic encephalopathy, history of stroke: gait issues, falls,     HISTORY OF PRESENT ILLNESS:  Discussed the use of AI scribe software for clinical note transcription with the patient, who gave verbal consent to proceed.  [name] is a 68 year old female with a history of arthritis, COPD, CKD, chronic pain, history of PE, peripheral neuropathy, hepatitis B, cirrhosis, abnormal gait, admission for metabolic encephalopathy who is presenting for possible old stroke seen on MRI.  Patient was admitted back in August 2024 for acute encephalopathy, during her admission she had a MRI that showed a suspected cortical stroke she was referred for a stroke.  He has rescheduled appointment and presenting today.  Denies any symptoms from the stroke, her main complaint today is a peripheral neuropathy and her right leg swelling.   She experiences swelling and redness in her right foot, accompanied by burning, numbness, and tingling sensations. Despite using compressive socks, which sometimes extend over the knee, the swelling persists. She has a history of severe arthritis, which she believes is hereditary, as her mother also had it. She has previously received knee injections but has stopped due to the swelling.  She has a history of blood clots and is currently on Eliquis . She was previously on Xarelto  but does not recall the reason for the change. She has undergone ultrasounds twice to rule out blood clots in her legs and lungs, both of which were negative. She has experienced falls this year and has a history of back surgeries, including spine  fusion, which she believes may have contributed to her symptoms.  She has a history of COPD and uses inhalers and a nebulizing machine. She has had pneumonia multiple times, which she believes contributed to a previous episode of sepsis, during which she was in an induced coma for fifteen days. She also has a history of hepatitis B, which she contracted from a partner, leading to mild cirrhosis of the liver. She reports an abnormal digestive system and poor dietary habits.  She has been diagnosed with third stage kidney disease and is concerned about the impact of medications on her kidneys. She is currently taking gabapentin  300 mg, 3 times daily which initially helped but now seems less effective. She also takes Xanax  as needed for severe anxiety but avoids taking it with sleep medications. She is cautious about medication use due to potential kidney damage.  She reports a history of a suspected mild mini-stroke, as informed by her daughter, but details are unclear. She has a history of rheumatic fever and whooping cough as a child, which led to a heart murmur.  She experiences lower lumbar pain and leg pain, which she attributes to her back surgeries and arthritis. She is concerned about her blood pressure, which has been low upon standing, leading to falls.    OTHER MEDICAL CONDITIONS: COPD, CKD, Hepatitis B, Cirrhosis, History of encephalopathy, Chronic pain,    REVIEW OF SYSTEMS: Full 14 system review of systems performed and negative with exception of: As noted in the HPI   ALLERGIES: Allergies  Allergen Reactions   Heparin  Hives    Pt states she cannot take  because she will break out in hives   Amitriptyline  Hcl Other (See Comments)    Changes in mental status    Azithromycin  Rash   Fentanyl  Other (See Comments)    Change in mental status - tolerates low dose   Prozac  [Fluoxetine  Hcl] Itching   Cymbalta [Duloxetine Hcl] Other (See Comments)    Headaches    Fluoxetine  Anxiety    Neomycin-Bacitracin Zn-Polymyx Rash    HOME MEDICATIONS: Outpatient Medications Prior to Visit  Medication Sig Dispense Refill   albuterol  (PROAIR  HFA) 108 (90 Base) MCG/ACT inhaler INHALE 2 PUFFS INTO THE LUNGS EVERY 4 HOURS AS NEEDED FOR WHEEZING (Patient taking differently: Inhale 2 puffs into the lungs every 4 (four) hours as needed for shortness of breath or wheezing.) 1 g 6   albuterol  (PROVENTIL ) (2.5 MG/3ML) 0.083% nebulizer solution Take 3 mLs (2.5 mg total) by nebulization every 4 (four) hours as needed for wheezing or shortness of breath. (Patient taking differently: Take 2.5 mg by nebulization as needed for wheezing or shortness of breath.) 75 mL 1   ALPRAZolam  (XANAX ) 1 MG tablet Take 1 tablet (1 mg total) by mouth 3 (three) times daily. (Patient taking differently: Take 1 mg by mouth 3 (three) times daily as needed for anxiety.)     apixaban  (ELIQUIS ) 5 MG TABS tablet Take 1 tablet (5 mg total) by mouth 2 (two) times daily. 60 tablet 1   budesonide -formoterol  (SYMBICORT ) 80-4.5 MCG/ACT inhaler Inhale 2 puffs into the lungs in the morning and at bedtime. 1 each 12   carvedilol  (COREG ) 12.5 MG tablet Take 1 tablet (12.5 mg total) by mouth 2 (two) times daily with a meal. 60 tablet 2   Cholecalciferol  (VITAMIN D3) 50 MCG (2000 UT) TABS Take 2,000 Units by mouth daily.     divalproex  (DEPAKOTE  ER) 500 MG 24 hr tablet Take 500 mg by mouth every evening.     docusate sodium  (COLACE) 100 MG capsule Take 100 mg by mouth 2 (two) times daily.     gabapentin  (NEURONTIN ) 300 MG capsule Take 300-600 mg by mouth 3 (three) times daily.     levocetirizine (XYZAL) 5 MG tablet Take 5 mg by mouth every evening.     montelukast  (SINGULAIR ) 10 MG tablet Take 10 mg by mouth daily.     NYSTATIN  powder Apply 1 Application topically as needed (yeast).     ondansetron  (ZOFRAN -ODT) 8 MG disintegrating tablet Take 8 mg by mouth every 8 (eight) hours as needed for nausea or vomiting.     pantoprazole  (PROTONIX )  40 MG tablet Take 40 mg by mouth 2 (two) times daily before a meal.  0   valACYclovir  (VALTREX ) 500 MG tablet Take 500 mg by mouth daily as needed (breakout).     VEMLIDY  25 MG tablet Take 25 mg by mouth daily.     clotrimazole -betamethasone  (LOTRISONE ) cream Apply to affected area 2 times daily prn (Patient taking differently: Apply 1 application  topically as needed (rash).) 15 g 0   doxepin  (SINEQUAN ) 100 MG capsule Take 100 mg by mouth at bedtime.     enoxaparin  (LOVENOX ) 120 MG/0.8ML SOLN Inject 0.8 mLs (120 mg total) into the skin every 12 (twelve) hours. 12.6 mL 5   entecavir  (BARACLUDE ) 0.5 MG tablet Take 0.5 mg by mouth every other day. (Patient not taking: Reported on 07/11/2023)     famotidine  (PEPCID ) 20 MG tablet Take 20 mg by mouth 2 (two) times daily.     ferrous sulfate  (FEROSUL) 325 (65  FE) MG tablet Take 325 mg by mouth daily.     loratadine  (CLARITIN ) 10 MG tablet Take 10 mg by mouth daily.       spironolactone  (ALDACTONE ) 50 MG tablet Take 50 mg by mouth daily.     No facility-administered medications prior to visit.    PAST MEDICAL HISTORY: Past Medical History:  Diagnosis Date   Agoraphobia with panic attacks    Altered mental state 10/26/2013   Ankle fracture, left 11/27/2010   S/p ORIF 11/2    Anxiety    Arthritis    knees; lower back (10/30/2013)   Bipolar disorder (HCC)    Cavitary pneumonia    Cirrhosis (HCC)    CKD (chronic kidney disease), stage III (HCC)    COPD (chronic obstructive pulmonary disease) (HCC)    Depression    DVT (deep venous thrombosis) (HCC) 09/2013   RLE   GERD (gastroesophageal reflux disease)    Heart murmur    Hepatitis B    History of blood transfusion    due to excessive blood loss before hysterectomy   History of urinary tract infection    Hypertension    currently on no medication    Kidney stones    Pneumonia    4 times in the past year (10/30/2013)   Pulmonary emboli (HCC) 09/2013   Reactive airway disease 08/24/2022    Shortness of breath dyspnea    increased exertion;exercise   Urinary frequency    Urinary incontinence     PAST SURGICAL HISTORY: Past Surgical History:  Procedure Laterality Date   ABDOMINAL HYSTERECTOMY     ANKLE FRACTURE SURGERY Left    BIOPSY  06/26/2022   Procedure: BIOPSY;  Surgeon: Rosalie Kitchens, MD;  Location: [facility];  Service: Gastroenterology;;   CARPAL TUNNEL RELEASE Left    DILATION AND CURETTAGE OF UTERUS  X 2   ESOPHAGOGASTRODUODENOSCOPY (EGD) WITH PROPOFOL  N/A 06/26/2022   Procedure: ESOPHAGOGASTRODUODENOSCOPY (EGD) WITH PROPOFOL ;  Surgeon: Rosalie Kitchens, MD;  Location: [facility];  Service: Gastroenterology;  Laterality: N/A;   FRACTURE SURGERY     HAND SURGERY     1980's/left hand    INSERTION OF MESH N/A 12/10/2014   Procedure: INSERTION OF MESH;  Surgeon: Elspeth Schultze, MD;  Location: [facility];  Service: General;  Laterality: N/A;   LAPAROSCOPIC ASSISTED VENTRAL HERNIA REPAIR N/A 12/10/2014   Procedure: LAPAROSCOPIC ASSISTED REPAIR OF INCARCERATED UMBILICAL HERNIA ;  Surgeon: Elspeth Schultze, MD;  Location: [facility];  Service: General;  Laterality: N/A;   TUBAL LIGATION      FAMILY HISTORY: Family History  Problem Relation Age of Onset   Stroke Mother    Lung cancer Father     SOCIAL HISTORY: Social History   Socioeconomic History   Marital status: Single    Spouse name: Not on file   Number of children: Not on file   Years of education: Not on file   Highest education level: Not on file  Occupational History   Not on file  Tobacco Use   Smoking status: Former    Current packs/day: 0.00    Average packs/day: 0.5 packs/day for 35.0 years (17.5 ttl pk-yrs)    Types: Cigarettes    Start date: 10/27/1978    Quit date: 10/26/2013    Years since quitting: 10.0   Smokeless tobacco: Never  Vaping Use   Vaping status: Some Days   Substances: Nicotine , Flavoring  Substance and Sexual Activity   Alcohol use: No    Alcohol/week:  0.0 standard drinks of  alcohol    Comment: stopped drinking  in 2004   Drug use: No    Types: Crack cocaine, Marijuana    Comment: 10/30/2013 last crack in 2014; last marijuana in ~ 1990 drug free for last 3 years    Sexual activity: Never  Other Topics Concern   Not on file  Social History Narrative   Not on file   Social Drivers of Health   Financial Resource Strain: Low Risk  (06/27/2023)   Received from [facility]   Overall Financial Resource Strain (CARDIA)    Difficulty of Paying Living Expenses: Not hard at all  Food Insecurity: No Food Insecurity (06/27/2023)   Received from [facility]   Hunger Vital Sign    Within the past 12 months, you worried that your food would run out before you got the money to buy more.: Never true    Within the past 12 months, the food you bought just didn't last and you didn't have money to get more.: Never true  Recent Concern: Food Insecurity - Food Insecurity Present (04/16/2023)   Received from [facility]   Hunger Vital Sign    Worried About Running Out of Food in the Last Year: Sometimes true    Ran Out of Food in the Last Year: Sometimes true  Transportation Needs: No Transportation Needs (06/27/2023)   Received from [facility] - Transportation    Lack of Transportation (Medical): No    Lack of Transportation (Non-Medical): No  Physical Activity: Unknown (06/27/2023)   Received from [facility]   Exercise Vital Sign    On average, how many days per week do you engage in moderate to strenuous exercise (like a brisk walk)?: 0 days    Minutes of Exercise per Session: Not on file  Stress: No Stress Concern Present (06/27/2023)   Received from [facility] - Occupational Stress Questionnaire    Feeling of Stress : Not at all  Social Connections: Socially Integrated (06/27/2023)   Received from [facility]   Social Network    How would you rate your social network (family, work, friends)?: Good  participation with social networks  Intimate Partner Violence: Not At Risk (06/27/2023)   Received from [facility]   HITS    Over the last 12 months how often did your partner physically hurt you?: Never    Over the last 12 months how often did your partner insult you or talk down to you?: Never    Over the last 12 months how often did your partner threaten you with physical harm?: Never    Over the last 12 months how often did your partner scream or curse at you?: Never    PHYSICAL EXAM  GENERAL EXAM/CONSTITUTIONAL: Vitals:  Vitals:   11/12/23 1413 11/12/23 1420  BP: (!) 152/69 (!) 86/52  Pulse: 97 66  Resp: 15 17  SpO2: 95%   Height: 5' 4 (1.626 m)    Body mass index is 39.48 kg/m. Wt Readings from Last 3 Encounters:  09/14/23 230 lb (104.3 kg)  07/15/23 242 lb 8.1 oz (110 kg)  05/11/23 229 lb 4.5 oz (104 kg)   Patient is in no distress; well developed, nourished and groomed; neck is supple  MUSCULOSKELETAL: Gait, strength, tone, movements noted in Neurologic exam below  NEUROLOGIC: MENTAL STATUS:      No data to display  awake, alert, oriented to person, place and time, talkative recent and remote memory intact normal attention and concentration language fluent, comprehension intact, naming intact fund of knowledge appropriate  CRANIAL NERVE:  2nd, 3rd, 4th, 6th - Visual fields full to confrontation, extraocular muscles intact, no nystagmus 5th - facial sensation symmetric 7th - facial strength symmetric 8th - hearing intact 9th - palate elevates symmetrically, uvula midline 11th - shoulder shrug symmetric 12th - tongue protrusion midline  MOTOR:  normal bulk and tone, full strength in the BUE, BLE  COORDINATION:  finger-nose-finger, fine finger movements normal  GAIT/STATION:  Antalgic, uses a walker      DIAGNOSTIC DATA (LABS, IMAGING, TESTING) - I reviewed patient records, labs, notes, testing and imaging myself where  available.  Lab Results  Component Value Date   WBC 4.1 09/14/2023   HGB 9.0 (L) 09/14/2023   HCT 29.1 (L) 09/14/2023   MCV 82.9 09/14/2023   PLT 149 (L) 09/14/2023      Component Value Date/Time   NA 134 (L) 09/14/2023 0419   NA 142 07/02/2022 1251   K 4.2 09/14/2023 0419   CL 98 09/14/2023 0419   CO2 25 09/14/2023 0419   GLUCOSE 85 09/14/2023 0419   BUN 23 09/14/2023 0419   BUN 11 07/02/2022 1251   CREATININE 1.33 (H) 09/14/2023 0419   CALCIUM 9.2 09/14/2023 0419   PROT 7.3 09/14/2023 0419   PROT 6.5 07/02/2022 1251   ALBUMIN 3.5 09/14/2023 0419   ALBUMIN 3.9 07/02/2022 1251   AST 21 09/14/2023 0419   ALT 17 09/14/2023 0419   ALKPHOS 87 09/14/2023 0419   BILITOT 0.5 09/14/2023 0419   BILITOT <0.2 07/02/2022 1251   GFRNONAA 44 (L) 09/14/2023 0419   GFRAA >60 10/26/2019 0440   Lab Results  Component Value Date   CHOL 140 05/30/2010   HDL 68 05/30/2010   LDLCALC  05/30/2010    58        Total Cholesterol/HDL:CHD Risk Coronary Heart Disease Risk Table                     Men   Women  1/2 Average Risk   3.4   3.3  Average Risk       5.0   4.4  2 X Average Risk   9.6   7.1  3 X Average Risk  23.4   11.0        Use the calculated Patient Ratio above and the CHD Risk Table to determine the patient's CHD Risk.        ATP III CLASSIFICATION (LDL):  <100     mg/dL   Optimal  899-870  mg/dL   Near or Above                    Optimal  130-159  mg/dL   Borderline  839-810  mg/dL   High  >809     mg/dL   Very High   LDLDIRECT 101 (H) 08/25/2022   TRIG 71 05/30/2010   CHOLHDL 2.1 05/30/2010   Lab Results  Component Value Date   HGBA1C 5.6 07/10/2023   Lab Results  Component Value Date   VITAMINB12 317 06/26/2022   Lab Results  Component Value Date   TSH 2.740 12/09/2013   Head CT 07/10/2023 Normal brain. No evidence of acute traumatic injury    MRI Brain 08/24/2022 1. No evidence of an acute intracranial abnormality. 2. Suspected small chronic  cortically-based infarct  within the right occipital lobe (PCA territory). 3. Mild chronic small vessel ischemic changes within the cerebral white matter and pons   ASSESSMENT AND PLAN  68 y.o. year old female with   Orthostatic hypotension Blood pressure dropped from 152/69 sitting to 86/52 standing consistent with orthostatic hypotension.  Discussed using compression socks, abdominal binder and to increase oral fluid intake.  Patient will also continue follow-up PCP.  Peripheral neuropathy with chronic pain in lower back and legs Chronic neuropathic pain likely due to peripheral neuropathy. Symptoms include burning, numbness, and tingling. Gabapentin  has been used but is not providing adequate relief and may affect kidney function. Duloxetine is not an option due to intolerance (headaches). - Prescribe Emla cream for topical pain relief - Decrease gabapentin  to 300 mg twice a day - Consider referral to pain management for further evaluation and management of chronic pain  Osteoarthritis of knee and spine Severe osteoarthritis in the knee and spine with a history of multiple back surgeries and spinal fusion. Chronic pain persists despite previous interventions. - Consider referral to pain management for comprehensive evaluation and management of osteoarthritis-related pain  Edema of right lower extremity Chronic edema in the right lower extremity with a history of DVT. Recent ultrasound showed no evidence of DVT. Concerns about potential vascular issues, but no current evidence of blood clots. Compression stockings have been used but cause discomfort. Blood pressure drops significantly upon standing, increasing fall risk. - Follow up with vein and vascular specialist on November 20th to assess for potential vascular issues - Consider use of compression stockings with caution due to discomfort - Advise use of assistive device when standing to prevent falls due to orthostatic  hypotension  Chronic kidney disease stage 3 Stage 3 chronic kidney disease with a GFR of 44. Gabapentin  may worsen kidney function. She is cautious about medications that could further damage kidneys. 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 - Avoid Tylenol  to prevent further liver damage  Chronic obstructive pulmonary disease (COPD) COPD managed with inhalers and nebulizer. History of pneumonia contributing to respiratory issues.  Anxiety disorder Severe anxiety managed with Xanax  as needed. She is cautious about medication use due to potential interactions and side effects. - Continue Xanax  as needed for anxiety management     1. Orthostatic hypotension   2. Peripheral polyneuropathy   3. Stage 3b chronic kidney disease (HCC)   4. Anxiety      Patient Instructions  ELMA cream for peripheral neuropathy  Decrease Gabapentin  to 300 mg twice daily  Continue current medications  Follow up with vein and vascular  Follow up with PCP, consider referral to podiatry  Return as needed   No orders of the defined types were placed in this encounter.   Meds ordered this encounter  Medications   DISCONTD: lidocaine -prilocaine (EMLA) cream    Sig: Apply 1 Application topically as needed.    Dispense:  30 g    Refill:  3   lidocaine -prilocaine (EMLA) cream    Sig: Apply 1 Application topically as needed.    Dispense:  30 g    Refill:  3    Return if symptoms worsen or fail to improve.  I have spent a total of 60 minutes dedicated to this patient today, preparing to see patient, performing a medically appropriate examination and evaluation, ordering tests  and/or medications and procedures, and counseling and educating the patient/family/caregiver; independently interpreting result and communicating results to  the family/patient/caregiver; and documenting clinical information in the electronic medical record.    Pastor Falling, MD 11/12/2023, 5:20 PM  [facility] [address] [place], [place] [address] [phone]

## 2023-11-12 NOTE — Patient Instructions (Signed)
 ELMA cream for peripheral neuropathy  Decrease Gabapentin  to 300 mg twice daily  Continue current medications  Follow up with vein and vascular  Follow up with PCP, consider referral to podiatry  Return as needed

## 2023-11-26 ENCOUNTER — Encounter: Payer: Self-pay | Admitting: Radiology

## 2023-12-06 ENCOUNTER — Telehealth: Payer: Self-pay | Admitting: Orthopedic Surgery

## 2023-12-06 DIAGNOSIS — M1711 Unilateral primary osteoarthritis, right knee: Secondary | ICD-10-CM

## 2023-12-06 NOTE — Telephone Encounter (Signed)
 Pt called requesting to submit for right knee gel injection Orthovisc. Please call pt about this matter at [phone]

## 2023-12-06 NOTE — Telephone Encounter (Signed)
 New referral placed. Lvm advising pt

## 2023-12-06 NOTE — Telephone Encounter (Signed)
Yes that's fine. Thank you

## 2023-12-13 ENCOUNTER — Ambulatory Visit: Attending: Vascular Surgery | Admitting: Physician Assistant

## 2023-12-13 VITALS — BP 179/121 | HR 89 | Temp 97.7°F | Wt 243.4 lb

## 2023-12-13 DIAGNOSIS — M7989 Other specified soft tissue disorders: Secondary | ICD-10-CM

## 2023-12-13 NOTE — Patient Instructions (Signed)

## 2023-12-13 NOTE — Progress Notes (Signed)
 Office Note     CC:  follow up Requesting Provider:  Gladystine Erminio CROME, MD  HPI: DEBBI STRANDBERG is a 68 y.o. (04-19-1955) female who presents for evaluation of bilateral lower extremity edema.  She believes her symptoms are worse on the right compared to the left.  Surgical history significant for multiple lumbar spine surgeries.  She believes some of the discomfort in her right leg including burning and itching is related to nerve damage.  She wears knee-high compression on a semiregular basis.  She has a history of a tibial vein DVT in the left lower extremity however no history of DVT on the right leg.  She denies history of venous ulcerations, significant trauma, or prior vascular interventions.  She admittedly is not very mobile and uses a walker to ambulate.  She is a former smoker.   Past Medical History:  Diagnosis Date   Agoraphobia with panic attacks    Altered mental state 10/26/2013   Ankle fracture, left 11/27/2010   S/p ORIF 11/2    Anxiety    Arthritis    knees; lower back (10/30/2013)   Bipolar disorder (HCC)    Cavitary pneumonia    Cirrhosis (HCC)    CKD (chronic kidney disease), stage III (HCC)    COPD (chronic obstructive pulmonary disease) (HCC)    Depression    DVT (deep venous thrombosis) (HCC) 09/2013   RLE   GERD (gastroesophageal reflux disease)    Heart murmur    Hepatitis B    History of blood transfusion    due to excessive blood loss before hysterectomy   History of urinary tract infection    Hypertension    currently on no medication    Kidney stones    Pneumonia    4 times in the past year (10/30/2013)   Pulmonary emboli (HCC) 09/2013   Reactive airway disease 08/24/2022   Shortness of breath dyspnea    increased exertion;exercise   Urinary frequency    Urinary incontinence     Past Surgical History:  Procedure Laterality Date   ABDOMINAL HYSTERECTOMY     ANKLE FRACTURE SURGERY Left    BIOPSY  06/26/2022   Procedure: BIOPSY;  Surgeon:  Rosalie Kitchens, MD;  Location: [facility];  Service: Gastroenterology;;   CARPAL TUNNEL RELEASE Left    DILATION AND CURETTAGE OF UTERUS  X 2   ESOPHAGOGASTRODUODENOSCOPY (EGD) WITH PROPOFOL  N/A 06/26/2022   Procedure: ESOPHAGOGASTRODUODENOSCOPY (EGD) WITH PROPOFOL ;  Surgeon: Rosalie Kitchens, MD;  Location: [facility];  Service: Gastroenterology;  Laterality: N/A;   FRACTURE SURGERY     HAND SURGERY     1980's/left hand    INSERTION OF MESH N/A 12/10/2014   Procedure: INSERTION OF MESH;  Surgeon: Elspeth Schultze, MD;  Location: [facility];  Service: General;  Laterality: N/A;   LAPAROSCOPIC ASSISTED VENTRAL HERNIA REPAIR N/A 12/10/2014   Procedure: LAPAROSCOPIC ASSISTED REPAIR OF INCARCERATED UMBILICAL HERNIA ;  Surgeon: Elspeth Schultze, MD;  Location: [facility];  Service: General;  Laterality: N/A;   TUBAL LIGATION      Social History   Socioeconomic History   Marital status: Single    Spouse name: Not on file   Number of children: Not on file   Years of education: Not on file   Highest education level: Not on file  Occupational History   Not on file  Tobacco Use   Smoking status: Former    Current packs/day: 0.00    Average packs/day: 0.5 packs/day for 35.0  years (17.5 ttl pk-yrs)    Types: Cigarettes    Start date: 10/27/1978    Quit date: 10/26/2013    Years since quitting: 10.1   Smokeless tobacco: Never  Vaping Use   Vaping status: Some Days   Substances: Nicotine , Flavoring  Substance and Sexual Activity   Alcohol use: No    Alcohol/week: 0.0 standard drinks of alcohol    Comment: stopped drinking  in 2004   Drug use: No    Types: Crack cocaine, Marijuana    Comment: 10/30/2013 last crack in 2014; last marijuana in ~ 1990 drug free for last 3 years    Sexual activity: Never  Other Topics Concern   Not on file  Social History Narrative   Not on file   Social Drivers of Health   Financial Resource Strain: Low Risk  (06/27/2023)   Received from [facility]   Overall  Financial Resource Strain (CARDIA)    Difficulty of Paying Living Expenses: Not hard at all  Food Insecurity: No Food Insecurity (06/27/2023)   Received from [facility]   Hunger Vital Sign    Within the past 12 months, you worried that your food would run out before you got the money to buy more.: Never true    Within the past 12 months, the food you bought just didn't last and you didn't have money to get more.: Never true  Recent Concern: Food Insecurity - Food Insecurity Present (04/16/2023)   Received from [facility]   Hunger Vital Sign    Worried About Running Out of Food in the Last Year: Sometimes true    Ran Out of Food in the Last Year: Sometimes true  Transportation Needs: No Transportation Needs (06/27/2023)   Received from [facility] - Transportation    Lack of Transportation (Medical): No    Lack of Transportation (Non-Medical): No  Physical Activity: Unknown (06/27/2023)   Received from [facility]   Exercise Vital Sign    On average, how many days per week do you engage in moderate to strenuous exercise (like a brisk walk)?: 0 days    Minutes of Exercise per Session: Not on file  Stress: No Stress Concern Present (06/27/2023)   Received from [facility] - Occupational Stress Questionnaire    Feeling of Stress : Not at all  Social Connections: Socially Integrated (06/27/2023)   Received from [facility]   Social Network    How would you rate your social network (family, work, friends)?: Good participation with social networks  Intimate Partner Violence: Not At Risk (06/27/2023)   Received from [facility]   HITS    Over the last 12 months how often did your partner physically hurt you?: Never    Over the last 12 months how often did your partner insult you or talk down to you?: Never    Over the last 12 months how often did your partner threaten you with physical harm?: Never    Over the last 12 months how  often did your partner scream or curse at you?: Never    Family History  Problem Relation Age of Onset   Stroke Mother    Lung cancer Father     Current Outpatient Medications  Medication Sig Dispense Refill   albuterol  (PROAIR  HFA) 108 (90 Base) MCG/ACT inhaler INHALE 2 PUFFS INTO THE LUNGS EVERY 4 HOURS AS NEEDED FOR WHEEZING (Patient taking differently: Inhale 2 puffs into  the lungs every 4 (four) hours as needed for shortness of breath or wheezing.) 1 g 6   albuterol  (PROVENTIL ) (2.5 MG/3ML) 0.083% nebulizer solution Take 3 mLs (2.5 mg total) by nebulization every 4 (four) hours as needed for wheezing or shortness of breath. (Patient taking differently: Take 2.5 mg by nebulization as needed for wheezing or shortness of breath.) 75 mL 1   ALPRAZolam  (XANAX ) 1 MG tablet Take 1 tablet (1 mg total) by mouth 3 (three) times daily. (Patient taking differently: Take 1 mg by mouth 3 (three) times daily as needed for anxiety.)     apixaban  (ELIQUIS ) 5 MG TABS tablet Take 1 tablet (5 mg total) by mouth 2 (two) times daily. 60 tablet 1   budesonide -formoterol  (SYMBICORT ) 80-4.5 MCG/ACT inhaler Inhale 2 puffs into the lungs in the morning and at bedtime. 1 each 12   carvedilol  (COREG ) 12.5 MG tablet Take 1 tablet (12.5 mg total) by mouth 2 (two) times daily with a meal. 60 tablet 2   Cholecalciferol  (VITAMIN D3) 50 MCG (2000 UT) TABS Take 2,000 Units by mouth daily.     divalproex  (DEPAKOTE  ER) 500 MG 24 hr tablet Take 500 mg by mouth every evening.     docusate sodium  (COLACE) 100 MG capsule Take 100 mg by mouth 2 (two) times daily.     gabapentin  (NEURONTIN ) 300 MG capsule Take 300-600 mg by mouth 3 (three) times daily.     levocetirizine (XYZAL) 5 MG tablet Take 5 mg by mouth every evening.     lidocaine -prilocaine  (EMLA ) cream Apply 1 Application topically as needed. 30 g 3   montelukast  (SINGULAIR ) 10 MG tablet Take 10 mg by mouth daily.     NYSTATIN  powder Apply 1 Application topically as  needed (yeast).     ondansetron  (ZOFRAN -ODT) 8 MG disintegrating tablet Take 8 mg by mouth every 8 (eight) hours as needed for nausea or vomiting.     pantoprazole  (PROTONIX ) 40 MG tablet Take 40 mg by mouth 2 (two) times daily before a meal.  0   valACYclovir  (VALTREX ) 500 MG tablet Take 500 mg by mouth daily as needed (breakout).     VEMLIDY  25 MG tablet Take 25 mg by mouth daily.     No current facility-administered medications for this visit.    Allergies  Allergen Reactions   Heparin  Hives    Pt states she cannot take because she will break out in hives   Amitriptyline  Hcl Other (See Comments)    Changes in mental status    Azithromycin  Rash   Fentanyl  Other (See Comments)    Change in mental status - tolerates low dose   Prozac  [Fluoxetine  Hcl] Itching   Cymbalta [Duloxetine Hcl] Other (See Comments)    Headaches    Fluoxetine  Anxiety   Neomycin-Bacitracin Zn-Polymyx Rash     REVIEW OF SYSTEMS:  Negative unless noted in HPI [X]  denotes positive finding, [ ]  denotes negative finding Cardiac  Comments:  Chest pain or chest pressure:    Shortness of breath upon exertion:    Short of breath when lying flat:    Irregular heart rhythm:        Vascular    Pain in calf, thigh, or hip brought on by ambulation:    Pain in feet at night that wakes you up from your sleep:     Blood clot in your veins:    Leg swelling:         Pulmonary    Oxygen  at  home:    Productive cough:     Wheezing:         Neurologic    Sudden weakness in arms or legs:     Sudden numbness in arms or legs:     Sudden onset of difficulty speaking or slurred speech:    Temporary loss of vision in one eye:     Problems with dizziness:         Gastrointestinal    Blood in stool:     Vomited blood:         Genitourinary    Burning when urinating:     Blood in urine:        Psychiatric    Major depression:         Hematologic    Bleeding problems:    Problems with blood clotting too  easily:        Skin    Rashes or ulcers:        Constitutional    Fever or chills:      PHYSICAL EXAMINATION:  Vitals:   12/13/23 0832  BP: (!) 179/121  Pulse: 89  Temp: 97.7 F (36.5 C)  TempSrc: Temporal  Weight: 243 lb 6.4 oz (110.4 kg)    General:  WDWN in NAD; vital signs documented above Gait: Not observed HENT: WNL, normocephalic Pulmonary: normal non-labored breathing Cardiac: regular HR Abdomen: soft, NT, no masses Skin: without rashes Vascular Exam/Pulses: Palpable 1+ right PT; palpable 2+ left DP Extremities: without ischemic changes, without Gangrene , without cellulitis; without open wounds;  Musculoskeletal: no muscle wasting or atrophy  Neurologic: A&O X 3 Psychiatric:  The pt has Normal affect.   Non-Invasive Vascular Imaging:   Right leg venous duplex negative for DVT in August  Right lower extremity venous reflux study negative for DVT, negative for deep venous reflux, negative for superficial venous reflux    ASSESSMENT/PLAN:: 68 y.o. female here for evaluation of bilateral lower extremity edema right worse than left  Ms. Dismore is a 68 year old female who was referred to our office for evaluation of lower extremity edema.  She has a history of left tibial vein DVT however is more concerned about swelling in the right leg.  She wears compression and elevates her leg periodically.  She had a right leg venous duplex which was negative for DVT in August.  Right lower extremity venous reflux study was negative for DVT, deep venous reflux, and superficial venous reflux.  Recommendations include continued regular use of compression socks.  She should also focus on periodic leg elevation during the day.  We also discussed avoidance of prolonged sitting and standing as well as weight loss.  I agree with the patient that her right leg weakness is likely related to her lumbar spine history.  No evidence of arterial insufficiency with palpable pedal pulses  bilaterally.  For now she will follow-up on an as-needed basis.   Donnice Sender, PA-C [facility] [phone]  Clinic MD:   [place]

## 2023-12-18 ENCOUNTER — Telehealth: Payer: Self-pay | Admitting: Surgical

## 2023-12-18 NOTE — Telephone Encounter (Signed)
 Talked with patient and appt.has been scheduled for gel and will speak with Herlene about a brace.

## 2023-12-18 NOTE — Telephone Encounter (Signed)
 Pt called asking if her injection has been approved by her insurance. I transferred her to April, but she said she didn't answer and couldn't leave a message. She also wants a brace for a brace.Call back number is [phone].

## 2024-01-07 ENCOUNTER — Ambulatory Visit: Admitting: Surgical

## 2024-02-04 ENCOUNTER — Telehealth: Payer: Self-pay | Admitting: Surgical

## 2024-02-04 NOTE — Telephone Encounter (Signed)
 Noted.  Will call patient to discuss about Orthovisc

## 2024-02-04 NOTE — Telephone Encounter (Signed)
 Pt states she would rather have the Orthovisc injection instead of the Durolane. Pt request we get auth from her insurance for the Orthovisc.

## 2024-02-07 ENCOUNTER — Ambulatory Visit: Admitting: Surgical

## 2024-02-07 NOTE — Telephone Encounter (Signed)
 Called and left a VM advising patient that her insurance has preferred products and Orthovisc is not preferred through her insurance.  Advised to CB with any questions

## 2024-02-12 ENCOUNTER — Ambulatory Visit: Admitting: Surgical

## 2024-02-28 ENCOUNTER — Ambulatory Visit: Admitting: Surgical

## 2024-04-14 ENCOUNTER — Ambulatory Visit (HOSPITAL_BASED_OUTPATIENT_CLINIC_OR_DEPARTMENT_OTHER): Admitting: Pulmonary Disease
